# Patient Record
Sex: Male | Born: 1940
Health system: Southern US, Community
[De-identification: ages and names within clinical notes are randomized; demographics above are authoritative.]

## PROBLEM LIST (undated history)

## (undated) DIAGNOSIS — E119 Type 2 diabetes mellitus without complications: Secondary | ICD-10-CM

## (undated) DIAGNOSIS — I429 Cardiomyopathy, unspecified: Secondary | ICD-10-CM

## (undated) DIAGNOSIS — L89309 Pressure ulcer of unspecified buttock, unspecified stage: Secondary | ICD-10-CM

## (undated) DIAGNOSIS — I509 Heart failure, unspecified: Secondary | ICD-10-CM

## (undated) DIAGNOSIS — D631 Anemia in chronic kidney disease: Secondary | ICD-10-CM

## (undated) DIAGNOSIS — IMO0001 Reserved for inherently not codable concepts without codable children: Secondary | ICD-10-CM

## (undated) DIAGNOSIS — Z960 Presence of urogenital implants: Secondary | ICD-10-CM

## (undated) DIAGNOSIS — Z789 Other specified health status: Secondary | ICD-10-CM

## (undated) DIAGNOSIS — G35 Multiple sclerosis: Secondary | ICD-10-CM

## (undated) DIAGNOSIS — IMO0002 Reserved for concepts with insufficient information to code with codable children: Secondary | ICD-10-CM

## (undated) DIAGNOSIS — M86151 Other acute osteomyelitis, right femur: Secondary | ICD-10-CM

## (undated) DIAGNOSIS — N179 Acute kidney failure, unspecified: Secondary | ICD-10-CM

## (undated) DIAGNOSIS — I10 Essential (primary) hypertension: Secondary | ICD-10-CM

## (undated) DIAGNOSIS — M5430 Sciatica, unspecified side: Secondary | ICD-10-CM

## (undated) DIAGNOSIS — Z9889 Other specified postprocedural states: Secondary | ICD-10-CM

## (undated) DIAGNOSIS — Z5189 Encounter for other specified aftercare: Secondary | ICD-10-CM

## (undated) DIAGNOSIS — Z87442 Personal history of urinary calculi: Secondary | ICD-10-CM

## (undated) DIAGNOSIS — N189 Chronic kidney disease, unspecified: Secondary | ICD-10-CM

## (undated) DIAGNOSIS — Z87440 Personal history of urinary (tract) infections: Secondary | ICD-10-CM

## (undated) DIAGNOSIS — N186 End stage renal disease: Secondary | ICD-10-CM

## (undated) DIAGNOSIS — N319 Neuromuscular dysfunction of bladder, unspecified: Secondary | ICD-10-CM

## (undated) DIAGNOSIS — R208 Other disturbances of skin sensation: Secondary | ICD-10-CM

## (undated) DIAGNOSIS — L89204 Pressure ulcer of unspecified hip, stage 4: Secondary | ICD-10-CM

## (undated) DIAGNOSIS — Z8614 Personal history of Methicillin resistant Staphylococcus aureus infection: Secondary | ICD-10-CM

## (undated) DIAGNOSIS — R112 Nausea with vomiting, unspecified: Secondary | ICD-10-CM

## (undated) HISTORY — PX: COLONOSCOPY: SHX5424

## (undated) HISTORY — PX: TONSILLECTOMY: SUR1361

## (undated) HISTORY — PX: WOUND DEBRIDEMENT: SHX247

## (undated) HISTORY — PX: OTHER SURGICAL HISTORY: SHX169

---
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
## 1997-09-11 ENCOUNTER — Other Ambulatory Visit: Admission: RE | Admit: 1997-09-11 | Discharge: 1997-09-11 | Payer: Self-pay | Admitting: Internal Medicine

## 1997-10-05 ENCOUNTER — Other Ambulatory Visit: Admission: RE | Admit: 1997-10-05 | Discharge: 1997-10-05 | Payer: Self-pay | Admitting: Internal Medicine

## 2000-07-19 ENCOUNTER — Encounter: Admission: RE | Admit: 2000-07-19 | Discharge: 2000-10-17 | Payer: Self-pay | Admitting: Internal Medicine

## 2001-07-04 ENCOUNTER — Encounter (HOSPITAL_COMMUNITY): Admission: RE | Admit: 2001-07-04 | Discharge: 2001-10-02 | Payer: Self-pay | Admitting: Internal Medicine

## 2002-05-04 HISTORY — PX: FINGER SURGERY: SHX640

## 2002-06-19 ENCOUNTER — Inpatient Hospital Stay (HOSPITAL_COMMUNITY): Admission: EM | Admit: 2002-06-19 | Discharge: 2002-06-23 | Payer: Self-pay | Admitting: Internal Medicine

## 2002-06-23 ENCOUNTER — Encounter: Payer: Self-pay | Admitting: Internal Medicine

## 2003-01-05 ENCOUNTER — Ambulatory Visit (HOSPITAL_BASED_OUTPATIENT_CLINIC_OR_DEPARTMENT_OTHER): Admission: RE | Admit: 2003-01-05 | Discharge: 2003-01-05 | Payer: Self-pay | Admitting: Urology

## 2003-01-11 ENCOUNTER — Ambulatory Visit (HOSPITAL_COMMUNITY): Admission: RE | Admit: 2003-01-11 | Discharge: 2003-01-11 | Payer: Self-pay | Admitting: Urology

## 2003-01-11 ENCOUNTER — Encounter: Payer: Self-pay | Admitting: Urology

## 2003-02-12 ENCOUNTER — Emergency Department (HOSPITAL_COMMUNITY): Admission: EM | Admit: 2003-02-12 | Discharge: 2003-02-12 | Payer: Self-pay | Admitting: Emergency Medicine

## 2003-02-12 ENCOUNTER — Encounter: Payer: Self-pay | Admitting: Emergency Medicine

## 2003-02-23 ENCOUNTER — Encounter: Payer: Self-pay | Admitting: Emergency Medicine

## 2003-02-23 ENCOUNTER — Inpatient Hospital Stay (HOSPITAL_COMMUNITY): Admission: EM | Admit: 2003-02-23 | Discharge: 2003-02-28 | Payer: Self-pay | Admitting: Emergency Medicine

## 2003-03-09 ENCOUNTER — Inpatient Hospital Stay (HOSPITAL_COMMUNITY): Admission: EM | Admit: 2003-03-09 | Discharge: 2003-05-21 | Payer: Self-pay | Admitting: Emergency Medicine

## 2003-05-21 ENCOUNTER — Inpatient Hospital Stay
Admission: RE | Admit: 2003-05-21 | Discharge: 2003-06-05 | Payer: Self-pay | Admitting: Physical Medicine & Rehabilitation

## 2003-06-05 ENCOUNTER — Inpatient Hospital Stay (HOSPITAL_COMMUNITY)
Admission: RE | Admit: 2003-06-05 | Discharge: 2003-06-26 | Payer: Self-pay | Admitting: Physical Medicine & Rehabilitation

## 2003-07-20 ENCOUNTER — Encounter
Admission: RE | Admit: 2003-07-20 | Discharge: 2003-10-18 | Payer: Self-pay | Admitting: Physical Medicine & Rehabilitation

## 2003-10-19 ENCOUNTER — Encounter
Admission: RE | Admit: 2003-10-19 | Discharge: 2004-01-17 | Payer: Self-pay | Admitting: Physical Medicine & Rehabilitation

## 2004-01-18 ENCOUNTER — Encounter
Admission: RE | Admit: 2004-01-18 | Discharge: 2004-02-25 | Payer: Self-pay | Admitting: Physical Medicine & Rehabilitation

## 2004-02-08 ENCOUNTER — Inpatient Hospital Stay (HOSPITAL_COMMUNITY): Admission: EM | Admit: 2004-02-08 | Discharge: 2004-02-18 | Payer: Self-pay | Admitting: Emergency Medicine

## 2004-02-21 ENCOUNTER — Ambulatory Visit (HOSPITAL_COMMUNITY): Admission: RE | Admit: 2004-02-21 | Discharge: 2004-02-21 | Payer: Self-pay | Admitting: Urology

## 2004-03-05 ENCOUNTER — Encounter
Admission: RE | Admit: 2004-03-05 | Discharge: 2004-06-03 | Payer: Self-pay | Admitting: Physical Medicine & Rehabilitation

## 2004-04-16 ENCOUNTER — Ambulatory Visit (HOSPITAL_COMMUNITY): Admission: RE | Admit: 2004-04-16 | Discharge: 2004-04-16 | Payer: Self-pay | Admitting: Urology

## 2004-06-04 ENCOUNTER — Encounter
Admission: RE | Admit: 2004-06-04 | Discharge: 2004-08-28 | Payer: Self-pay | Admitting: Physical Medicine & Rehabilitation

## 2004-10-19 ENCOUNTER — Emergency Department (HOSPITAL_COMMUNITY): Admission: EM | Admit: 2004-10-19 | Discharge: 2004-10-20 | Payer: Self-pay | Admitting: Emergency Medicine

## 2011-03-13 ENCOUNTER — Encounter (HOSPITAL_COMMUNITY): Payer: Self-pay

## 2011-03-13 ENCOUNTER — Other Ambulatory Visit: Payer: Self-pay | Admitting: Urology

## 2011-03-19 ENCOUNTER — Encounter (HOSPITAL_COMMUNITY): Payer: Self-pay | Admitting: *Deleted

## 2011-03-20 ENCOUNTER — Other Ambulatory Visit (HOSPITAL_COMMUNITY): Payer: Self-pay | Admitting: *Deleted

## 2011-03-23 ENCOUNTER — Encounter (HOSPITAL_COMMUNITY)
Admission: AD | Disposition: A | Discharge status: Home / Self Care | Payer: Self-pay | Source: Ambulatory Visit | Attending: Urology

## 2011-03-23 ENCOUNTER — Observation Stay (HOSPITAL_COMMUNITY)
Admission: AD | Admit: 2011-03-23 | Discharge: 2011-03-24 | Disposition: A | Discharge status: Home / Self Care | Payer: Medicare Other | Source: Ambulatory Visit | Attending: Urology | Admitting: Urology

## 2011-03-23 ENCOUNTER — Ambulatory Visit (HOSPITAL_COMMUNITY): Payer: Medicare Other

## 2011-03-23 DIAGNOSIS — E119 Type 2 diabetes mellitus without complications: Secondary | ICD-10-CM | POA: Insufficient documentation

## 2011-03-23 DIAGNOSIS — G35 Multiple sclerosis: Secondary | ICD-10-CM | POA: Insufficient documentation

## 2011-03-23 DIAGNOSIS — Z7982 Long term (current) use of aspirin: Secondary | ICD-10-CM | POA: Insufficient documentation

## 2011-03-23 DIAGNOSIS — R110 Nausea: Secondary | ICD-10-CM | POA: Insufficient documentation

## 2011-03-23 DIAGNOSIS — R52 Pain, unspecified: Secondary | ICD-10-CM | POA: Insufficient documentation

## 2011-03-23 DIAGNOSIS — Z79899 Other long term (current) drug therapy: Secondary | ICD-10-CM | POA: Insufficient documentation

## 2011-03-23 DIAGNOSIS — N200 Calculus of kidney: Principal | ICD-10-CM | POA: Insufficient documentation

## 2011-03-23 DIAGNOSIS — I10 Essential (primary) hypertension: Secondary | ICD-10-CM | POA: Insufficient documentation

## 2011-03-23 HISTORY — DX: Nausea with vomiting, unspecified: R11.2

## 2011-03-23 HISTORY — DX: Essential (primary) hypertension: I10

## 2011-03-23 HISTORY — DX: Reserved for inherently not codable concepts without codable children: IMO0001

## 2011-03-23 HISTORY — DX: Other specified postprocedural states: Z98.890

## 2011-03-23 HISTORY — DX: Encounter for other specified aftercare: Z51.89

## 2011-03-23 HISTORY — PX: EXTRACORPOREAL SHOCK WAVE LITHOTRIPSY: SHX1557

## 2011-03-23 HISTORY — DX: Sciatica, unspecified side: M54.30

## 2011-03-23 SURGERY — LITHOTRIPSY, ESWL
Anesthesia: LOCAL | Laterality: Right

## 2011-03-23 MED ORDER — HYDROMORPHONE HCL PF 2 MG/ML IJ SOLN: 1.0000 mg | INTRAMUSCULAR | Status: DC | PRN

## 2011-03-23 MED ORDER — HYDROMORPHONE HCL PF 2 MG/ML IJ SOLN
INTRAMUSCULAR | Status: AC
  Administered 2011-03-23: 1 mg via INTRAVENOUS
  Filled 2011-03-23: qty 1

## 2011-03-23 MED ORDER — CIPROFLOXACIN HCL 500 MG PO TABS
500.0000 mg | ORAL_TABLET | Freq: Once | ORAL | Status: DC
  Administered 2011-03-23: 500 mg via ORAL

## 2011-03-23 MED ORDER — DIAZEPAM 5 MG PO TABS
10.0000 mg | ORAL_TABLET | ORAL | Status: DC
  Administered 2011-03-23: 10 mg via ORAL

## 2011-03-23 MED ORDER — DIPHENHYDRAMINE HCL 25 MG PO CAPS
25.0000 mg | ORAL_CAPSULE | ORAL | Status: AC
  Administered 2011-03-23: 25 mg via ORAL

## 2011-03-23 MED ORDER — ASPIRIN 81 MG PO TABS: 81.0000 mg | ORAL_TABLET | Freq: Every day | ORAL | Status: DC

## 2011-03-23 MED ORDER — OMEGA-3 FATTY ACIDS 1000 MG PO CAPS: 1.0000 g | ORAL_CAPSULE | Freq: Every day | ORAL | Status: DC

## 2011-03-23 MED ORDER — TAMSULOSIN HCL 0.4 MG PO CAPS: 0.4000 mg | ORAL_CAPSULE | Freq: Every day | ORAL | Status: DC

## 2011-03-23 MED ORDER — HYDROCODONE-ACETAMINOPHEN 5-325 MG PO TABS: 1.0000 | ORAL_TABLET | ORAL | Status: AC | PRN

## 2011-03-23 MED ORDER — SODIUM CHLORIDE 0.45 % IV SOLN: INTRAVENOUS | Status: DC

## 2011-03-23 MED ORDER — CIPROFLOXACIN HCL 500 MG PO TABS
ORAL_TABLET | ORAL | Status: AC
  Administered 2011-03-23: 500 mg via ORAL
  Filled 2011-03-23: qty 1

## 2011-03-23 MED ORDER — ONDANSETRON HCL 4 MG PO TABS: 4.0000 mg | ORAL_TABLET | Freq: Four times a day (QID) | ORAL | Status: DC | PRN

## 2011-03-23 MED ORDER — ONDANSETRON HCL 4 MG/2ML IJ SOLN: 4.0000 mg | Freq: Four times a day (QID) | INTRAMUSCULAR | Status: DC | PRN

## 2011-03-23 MED ORDER — CIPROFLOXACIN HCL 500 MG PO TABS: 500.0000 mg | ORAL_TABLET | Freq: Two times a day (BID) | ORAL | Status: AC

## 2011-03-23 MED ORDER — DIAZEPAM 5 MG PO TABS
ORAL_TABLET | ORAL | Status: AC
  Filled 2011-03-23: qty 2

## 2011-03-23 MED ORDER — PROMETHAZINE HCL 25 MG/ML IJ SOLN: 25.0000 mg | Freq: Four times a day (QID) | INTRAMUSCULAR | Status: DC | PRN

## 2011-03-23 MED ORDER — PROMETHAZINE HCL 25 MG/ML IJ SOLN
INTRAMUSCULAR | Status: AC
  Administered 2011-03-23: 25 mg via INTRAVENOUS
  Filled 2011-03-23: qty 1

## 2011-03-23 MED ORDER — DIPHENHYDRAMINE HCL 25 MG PO CAPS
ORAL_CAPSULE | ORAL | Status: AC
  Filled 2011-03-23: qty 1

## 2011-03-23 MED ORDER — HYDROMORPHONE HCL PF 1 MG/ML IJ SOLN: 1.0000 mg | INTRAMUSCULAR | Status: DC | PRN

## 2011-03-23 NOTE — Progress Notes (Signed)
Chief Complaint Right Nephrolithiasis  History of Present Illness         70 year old gentleman has a right upper pole 14 mm renal calculus. It is 600 Hounsfield units or less. I explained that this could be associated with his urinary infections, or it could be his urinary stasis that causes infections. I would guess since he does not have fevers or signs of pyelonephritis and that his stone is not likely the source of his infections.  Today we discussed the management of urinary stones. We discussed the risks, benefits, alternatives and likelihood of achieving his goals. These options include observation, ureteroscopy, shockwave lithotripsy, and PCNL. We discussed which options are relevant to these particular stones. We discussed the natural history of stones as well as the complications of untreated stones and the impact on quality of life without treatment as well as with each of the above listed treatments. We also discussed the efficacy of each treatment in its ability to clear the stone burden. With any of these management options I discussed the signs and symptoms of infection and the need for emergent treatment should these be experienced. For each option we discussed the ability of each procedure to clear the patient of their stone burden.  For observation I described the risks which include but are not limited to silent renal damage, life-threatening infection, need for emergent surgery, failure to pass stone, and pain.  For ureteroscopy I described the risks which include heart attack, stroke, pulmonary embolus, death, bleeding, infection, damage to contiguous structures, positioning injury, ureteral stricture, ureteral avulsion, ureteral injury, need for ureteral stent, inability to perform ureteroscopy, need for an interval procedure, inability to clear stone burden, stent discomfort and pain.  For shockwave lithotripsy I described the risks which include arrhythmia, kidney contusion,  kidney hemorrhage, need for transfusion, long-term risk of diabetes or hypertension, back discomfort, flank ecchymosis, flank abrasion, inability to break up stone, inability to pass stone fragments, Steinstrasse, infection associated with obstructing stones, need for different surgical procedure, need for repeat shockwave lithotripsy, and death.  For PCNL I described the risks including heart attack, sure, pulmonary embolus, death, positioning injury, pneumothorax, hydrothorax, need for chest tube, inability to clear stone burden, renal laceration, arterial venous fistula or malformation, need for embolization of kidney, loss of kidney or renal function, need for repeat procedure, need for prolonged nephrostomy tube, ureteral avulsion, fistula.  Past Medical History Problems  1. History of  Diabetes Mellitus 250.00 2. History of  Multiple Sclerosis 340 3. History of  Nephrolithiasis V13.01 4. History of  Nephrolithiasis Of The Left Kidney V13.01 5. History of  Pyelonephritis 590.80  Surgical History Problems  1. History of  Decubitus Ulcer Excision  Current Meds 1. Actos 30 MG Oral Tablet; Therapy: (Recorded:27Apr2009) to 2. Aspirin 81 MG Oral Tablet; Therapy: (Recorded:27Apr2009) to 3. Catheter Red Rubber Coude Tip Miscellaneous; CIC Q8hrs; Therapy: 07Sep2012 to (Last  Rx:07Sep2012) 4. Ciprofloxacin HCl 500 MG Oral Tablet; Take 1 tablet twice daily; Therapy: [id number] to  (Evaluate:09Nov2012)  Requested for: [id number]; Last Rx:02Nov2012 5. Fish Oil CAPS; Therapy: (Recorded:12Jan2010) to 6. Glimepiride 4 MG Oral Tablet; Therapy: (Recorded:27Apr2009) to 7. Lisinopril-Hydrochlorothiazide 20-25 MG Oral Tablet; Therapy: (Recorded:27Apr2009) to 8. Methenamine Hippurate 1 GM Oral Tablet; 1/2 tab bid; Therapy: [phone] to  (Evaluate:26Aug2013)  Requested for: 31Aug2012; Last Rx:31Aug2012 9. Oxybutynin Chloride 5 MG Oral Tablet; TAKE 1 TABLET 2-3 TIMES DAILY; Therapy: 20Jul2009 to   (Evaluate:16Aug2013); Last Rx:21Aug2012 10. Simvastatin 20 MG Oral Tablet; Therapy: (Recorded:27Apr2009) to  Allergies  Medication  1. No Known Drug Allergies  Family History Problems  1. Family history of  Death In The Family Father Deceasd at age 17; Stroke 2. Family history of  Death In The Family Mother Deceased at age 31; Stroke 3. Paternal history of  Diabetes Mellitus V18.0 4. Paternal grandmother's history of  Diabetes Mellitus V18.0 5. Family history of  Family Health Status Number Of Children 2 sons, 3 daughters 62. Paternal history of  Transient Ischemic Attack 7. Maternal history of  Transient Ischemic Attack  Social History Problems  1. Alcohol Use 1 a day 2. Former Smoker V15.82 3. Marital History - Currently Married 4. Occupation: retired Producer, television/film/video  5. Caffeine Use 6. Tobacco Use V15.82  Review of Systems Constitutional, skin, eye, otolaryngeal, hematologic/lymphatic, cardiovascular, pulmonary, endocrine, musculoskeletal, gastrointestinal, neurological and psychiatric system(s) were reviewed and pertinent findings if present are noted.     Physical Exam Constitutional: Well nourished and well developed.  Neck: The appearance of the neck is normal.  Pulmonary: No respiratory distress and normal respiratory rhythm and effort.  Abdomen: The abdomen is soft and nontender. No masses are palpated. The abdomen is no rebound. No CVA tenderness.  Genitourinary: Examination of the penis demonstrates a penile prosthesis, but no swelling, no tenderness, no discharge and no lesions.  Skin: Normal skin turgor and no visible rash.  Neuro/Psych:. Mood and affect are appropriate.    Procedure  Procedure: Cystoscopy  Indication: Hematuria.  Informed Consent: Risks, benefits, and potential adverse events were discussed and informed consent was obtained from the patient . Specific risks including, but not limited to bleeding, infection, pain, allergic reaction etc. were  explained.  Prep: The patient was prepped with betadine.  Anesthesia:. Local anesthesia was administered intraurethrally with 2% lidocaine jelly.  Antibiotic prophylaxis: Ciprofloxacin.  Procedure Note:  Urethral meatus:. No abnormalities.  Anterior urethra: No abnormalities.  Prostatic urethra: No abnormalities.  Bladder: Visulization was obscured due to cloudy urine. The right ureteral orifice had clear efflux of urine. The left ureteral orifice was not able to be identified. Examination of the bladder demonstrated trabeculation, but no clot within the bladder and no diverticulum cellules, but no erythematous mucosa and no edema. The patient tolerated the procedure well.  Complications: None.    Assessment Assessed  Nephrolithiasis Of The Right Kidney   Plan Proceed with shockwave lithotripsy of his right upper pole renal stone.

## 2011-03-23 NOTE — Op Note (Signed)
See Operative Report scanned in from [facility].

## 2011-03-23 NOTE — Progress Notes (Signed)
pateint doing in and out self cath in sitting position felt nauseous and felt faint. Patient pale responsive vs taken, patient denies pain. Nasal O2 applied at 2L.

## 2011-03-24 ENCOUNTER — Encounter (HOSPITAL_COMMUNITY): Payer: Self-pay | Admitting: *Deleted

## 2011-03-24 DIAGNOSIS — N200 Calculus of kidney: Secondary | ICD-10-CM

## 2011-03-24 LAB — GLUCOSE, CAPILLARY

## 2011-03-24 NOTE — Plan of Care (Signed)
Problem: Phase I Progression Outcomes Goal: Voiding-avoid urinary catheter unless indicated Outcome: Not Applicable Date Met:  [id number] Pt self caths q6h

## 2011-03-24 NOTE — Progress Notes (Addendum)
MD(PA)  on call for Dr. Jasmine December notified that pt had not been seen today (03/24/11).  PA said that pt would be seen tomorrow (03/25/11) by Dr. Jasmine December. Pt self-catheterizes every 6 hours.  Urine output for today has been 150 cc.

## 2011-03-24 NOTE — Discharge Planning (Signed)
Discharge instructions given to pt/spouse, verbalized understanding. Left the unit in stable condition.  

## 2011-03-24 NOTE — Discharge Summary (Signed)
Physician Discharge Summary  Patient ID: Lance [name] MRN: [id number] [name]/AGE: 70-Jul-1942 70 y.o.  Admit date: 03/23/2011 Discharge date: 03/24/2011  Admission Diagnoses:  Discharge Diagnoses:  Active Problems:  * No active hospital problems. *    Discharged Condition: good  Hospital Course:  Patient was admitted after SWL on right kidney for nausea and pain.  He was able to control his pain.  His nausea subsided.  He was able to tolerate a regular diet.  He had no significant fevers.  Consults: none  Significant Diagnostic Studies: None  Treatments: Shockwave lithotripsy on the right kidney.  Discharge Exam: Blood pressure 127/64, pulse 96, temperature 98.6 F (37 C), temperature source Oral, resp. rate 18, height 6\' 4"  (1.93 m), weight 92.987 kg (205 lb), SpO2 94.00%. Gen:  NAD, AAO Chest:  Equal effort bilaterally.  No labored breathing. CV:  Pules regular and equal in BUE Abdomen: Soft, no rebound TTP, no guarding Flank:  Right flank with 2.5 cm abrasion.  No flank ecchymosis. Extremity: No gross deformity BUE.  Disposition:   Discharge Orders    Future Orders Please Complete By Expires   Discharge instructions      Comments:   Patient to follow up 112712 at 8:30 am   Discharge patient      Comments:   According to post-SWL protocol.   Discharge patient        Current Discharge Medication List    START taking these medications   Details  !! ciprofloxacin (CIPRO) 500 MG tablet Take 1 tablet (500 mg total) by mouth 2 (two) times daily. Qty: 6 tablet, Refills: 0    HYDROcodone-acetaminophen (NORCO) 5-325 MG per tablet Take 1-2 tablets by mouth every 4 (four) hours as needed for pain. Qty: 40 tablet, Refills: 0    Tamsulosin HCl (FLOMAX) 0.4 MG CAPS Take 1 capsule (0.4 mg total) by mouth at bedtime. Qty: 30 capsule, Refills: 3     !! - Potential duplicate medications found. Please discuss with provider.    CONTINUE these medications which have  CHANGED   Details  aspirin 81 MG tablet Take 1 tablet (81 mg total) by mouth daily. Qty: 1 tablet, Refills: 0    fish oil-omega-3 fatty acids 1000 MG capsule Take 1 capsule (1 g total) by mouth daily. Qty: 1 capsule, Refills: 0      CONTINUE these medications which have NOT CHANGED   Details  Cholecalciferol (VITAMIN D3) 2000 UNITS TABS Take 2,000 mg by mouth daily.      !! ciprofloxacin (CIPRO) 500 MG tablet Take 500 mg by mouth 2 (two) times daily. Pt started on 03-06-11 for 7 days     glimepiride (AMARYL) 4 MG tablet Take 4 mg by mouth every morning.      lisinopril-hydrochlorothiazide (PRINZIDE,ZESTORETIC) 20-25 MG per tablet Take 1 tablet by mouth every morning.      methenamine (MANDELAMINE) 1 GM tablet Take 0.5 g by mouth daily.      oxybutynin (DITROPAN) 5 MG tablet Take 5 mg by mouth 2 (two) times daily.      pioglitazone (ACTOS) 30 MG tablet Take 30 mg by mouth daily.      simvastatin (ZOCOR) 20 MG tablet Take 20 mg by mouth at bedtime.       !! - Potential duplicate medications found. Please discuss with provider.     Follow-up Information    Follow up with Molli Hazard, MD on 03/31/2011. (8:30 am)    Contact information:  [facility] [place] [phone]          Signed: Molli Hazard 03/24/2011, 6:30 PM

## 2011-03-30 ENCOUNTER — Emergency Department (HOSPITAL_COMMUNITY): Payer: Medicare Other

## 2011-03-30 ENCOUNTER — Encounter: Payer: Self-pay | Admitting: Urology

## 2011-03-30 ENCOUNTER — Inpatient Hospital Stay (HOSPITAL_COMMUNITY)
Admission: EM | Admit: 2011-03-30 | Discharge: 2011-04-06 | DRG: 394 | Disposition: A | Discharge status: Rehabilitation Facility | Payer: Medicare Other | Attending: Internal Medicine | Admitting: Internal Medicine

## 2011-03-30 ENCOUNTER — Encounter (HOSPITAL_COMMUNITY): Payer: Self-pay

## 2011-03-30 DIAGNOSIS — N390 Urinary tract infection, site not specified: Secondary | ICD-10-CM | POA: Diagnosis present

## 2011-03-30 DIAGNOSIS — L89899 Pressure ulcer of other site, unspecified stage: Secondary | ICD-10-CM | POA: Diagnosis present

## 2011-03-30 DIAGNOSIS — L89309 Pressure ulcer of unspecified buttock, unspecified stage: Secondary | ICD-10-CM | POA: Diagnosis present

## 2011-03-30 DIAGNOSIS — L89159 Pressure ulcer of sacral region, unspecified stage: Secondary | ICD-10-CM

## 2011-03-30 DIAGNOSIS — L89509 Pressure ulcer of unspecified ankle, unspecified stage: Secondary | ICD-10-CM | POA: Diagnosis present

## 2011-03-30 DIAGNOSIS — E119 Type 2 diabetes mellitus without complications: Secondary | ICD-10-CM | POA: Diagnosis present

## 2011-03-30 DIAGNOSIS — I129 Hypertensive chronic kidney disease with stage 1 through stage 4 chronic kidney disease, or unspecified chronic kidney disease: Secondary | ICD-10-CM | POA: Diagnosis present

## 2011-03-30 DIAGNOSIS — D649 Anemia, unspecified: Secondary | ICD-10-CM

## 2011-03-30 DIAGNOSIS — L97309 Non-pressure chronic ulcer of unspecified ankle with unspecified severity: Secondary | ICD-10-CM

## 2011-03-30 DIAGNOSIS — E860 Dehydration: Secondary | ICD-10-CM | POA: Diagnosis present

## 2011-03-30 DIAGNOSIS — A4902 Methicillin resistant Staphylococcus aureus infection, unspecified site: Secondary | ICD-10-CM | POA: Diagnosis present

## 2011-03-30 DIAGNOSIS — N179 Acute kidney failure, unspecified: Secondary | ICD-10-CM | POA: Diagnosis present

## 2011-03-30 DIAGNOSIS — G35 Multiple sclerosis: Secondary | ICD-10-CM | POA: Insufficient documentation

## 2011-03-30 DIAGNOSIS — Z79899 Other long term (current) drug therapy: Secondary | ICD-10-CM

## 2011-03-30 DIAGNOSIS — L89109 Pressure ulcer of unspecified part of back, unspecified stage: Secondary | ICD-10-CM | POA: Diagnosis present

## 2011-03-30 DIAGNOSIS — Z87442 Personal history of urinary calculi: Secondary | ICD-10-CM

## 2011-03-30 DIAGNOSIS — D62 Acute posthemorrhagic anemia: Secondary | ICD-10-CM | POA: Diagnosis present

## 2011-03-30 DIAGNOSIS — L8992 Pressure ulcer of unspecified site, stage 2: Secondary | ICD-10-CM | POA: Diagnosis present

## 2011-03-30 DIAGNOSIS — Y92009 Unspecified place in unspecified non-institutional (private) residence as the place of occurrence of the external cause: Secondary | ICD-10-CM

## 2011-03-30 DIAGNOSIS — N1330 Unspecified hydronephrosis: Secondary | ICD-10-CM | POA: Diagnosis present

## 2011-03-30 DIAGNOSIS — Z7982 Long term (current) use of aspirin: Secondary | ICD-10-CM

## 2011-03-30 DIAGNOSIS — K661 Hemoperitoneum: Principal | ICD-10-CM | POA: Diagnosis present

## 2011-03-30 DIAGNOSIS — M5430 Sciatica, unspecified side: Secondary | ICD-10-CM | POA: Diagnosis present

## 2011-03-30 DIAGNOSIS — R238 Other skin changes: Secondary | ICD-10-CM

## 2011-03-30 DIAGNOSIS — W19XXXA Unspecified fall, initial encounter: Secondary | ICD-10-CM | POA: Diagnosis present

## 2011-03-30 DIAGNOSIS — K5900 Constipation, unspecified: Secondary | ICD-10-CM | POA: Diagnosis not present

## 2011-03-30 DIAGNOSIS — R5381 Other malaise: Secondary | ICD-10-CM | POA: Diagnosis present

## 2011-03-30 DIAGNOSIS — Y999 Unspecified external cause status: Secondary | ICD-10-CM

## 2011-03-30 DIAGNOSIS — N189 Chronic kidney disease, unspecified: Secondary | ICD-10-CM | POA: Diagnosis present

## 2011-03-30 DIAGNOSIS — IMO0002 Reserved for concepts with insufficient information to code with codable children: Secondary | ICD-10-CM | POA: Diagnosis present

## 2011-03-30 LAB — CBC
HCT: 19.6 % — ABNORMAL LOW (ref 39.0–52.0)
Hemoglobin: 6.7 g/dL — CL (ref 13.0–17.0)
WBC: 11.6 10*3/uL — ABNORMAL HIGH (ref 4.0–10.5)

## 2011-03-30 LAB — DIFFERENTIAL
Basophils Absolute: 0 10*3/uL (ref 0.0–0.1)
Lymphocytes Relative: 12 % (ref 12–46)
Monocytes Absolute: 0.9 10*3/uL (ref 0.1–1.0)
Monocytes Relative: 8 % (ref 3–12)
Neutro Abs: 9 10*3/uL — ABNORMAL HIGH (ref 1.7–7.7)

## 2011-03-30 LAB — URINALYSIS, ROUTINE W REFLEX MICROSCOPIC
Glucose, UA: NEGATIVE mg/dL
Ketones, ur: NEGATIVE mg/dL
Specific Gravity, Urine: 1.013 (ref 1.005–1.030)
pH: 5.5 (ref 5.0–8.0)

## 2011-03-30 LAB — URINE MICROSCOPIC-ADD ON

## 2011-03-30 LAB — ABO/RH: ABO/RH(D): A POS

## 2011-03-30 LAB — BASIC METABOLIC PANEL
BUN: 102 mg/dL — ABNORMAL HIGH (ref 6–23)
CO2: 21 mEq/L (ref 19–32)
Chloride: 104 mEq/L (ref 96–112)
Creatinine, Ser: 4.53 mg/dL — ABNORMAL HIGH (ref 0.50–1.35)
Potassium: 4.5 mEq/L (ref 3.5–5.1)

## 2011-03-30 LAB — GLUCOSE, CAPILLARY

## 2011-03-30 LAB — PREPARE RBC (CROSSMATCH)

## 2011-03-30 MED ORDER — SODIUM CHLORIDE 0.9 % IV SOLN
INTRAVENOUS | Status: DC
  Administered 2011-03-30: 23:00:00 via INTRAVENOUS

## 2011-03-30 MED ORDER — VANCOMYCIN HCL IN DEXTROSE 1-5 GM/200ML-% IV SOLN: 1000.0000 mg | INTRAVENOUS | Status: DC

## 2011-03-30 MED ORDER — PIPERACILLIN-TAZOBACTAM IN DEX 2-0.25 GM/50ML IV SOLN
2.2500 g | Freq: Three times a day (TID) | INTRAVENOUS | Status: DC
  Administered 2011-03-31: 2.25 g via INTRAVENOUS
  Filled 2011-03-30 (×4): qty 50

## 2011-03-30 MED ORDER — VANCOMYCIN HCL IN DEXTROSE 1-5 GM/200ML-% IV SOLN
1000.0000 mg | INTRAVENOUS | Status: AC
  Administered 2011-03-30: 1000 mg via INTRAVENOUS
  Filled 2011-03-30: qty 200

## 2011-03-30 MED ORDER — SODIUM CHLORIDE 0.9 % IV BOLUS (SEPSIS)
1000.0000 mL | Freq: Once | INTRAVENOUS | Status: AC
  Administered 2011-03-30: 1000 mL via INTRAVENOUS

## 2011-03-30 MED ORDER — CIPROFLOXACIN IN D5W 400 MG/200ML IV SOLN: 400.0000 mg | Freq: Two times a day (BID) | INTRAVENOUS | Status: DC

## 2011-03-30 MED ORDER — PIPERACILLIN-TAZOBACTAM 3.375 G IVPB 30 MIN
3.3750 g | Freq: Three times a day (TID) | INTRAVENOUS | Status: DC
  Administered 2011-03-30: 3.375 g via INTRAVENOUS
  Filled 2011-03-30 (×3): qty 50

## 2011-03-30 NOTE — ED Notes (Signed)
Ultrasound tech at bedside with pt at this time.

## 2011-03-30 NOTE — ED Notes (Signed)
Per ems----pt has large blisters on his left foot.  Pt is not very mobile.  PCP told him to call EMS.  He is a diabetic and has MS.

## 2011-03-30 NOTE — ED Notes (Signed)
Patient transported to X-ray 

## 2011-03-30 NOTE — H&P (Signed)
Lance [name] MRN: [id number] [name]/AGE: January 24, 1941 70 y.o. Primary Care Physician:No primary provider on file. Admit date: 03/30/2011  PCP Dr. Lavone Orn  Chief Complaint:weakness  HPI: This is a 70 year old male who presents to the ER with multiple complaints. He had an ESWL done on the 21st. Following the procedure he spent the night in the hospital without food, without any admission orders. He subsequently went discharged, after being on monitor and in the hospital for 24 hours and started experiencing weakness and lightheadedness. At baseline has a power chair and is able to do transfers between his powerchair in his bed up however he was unable to do so after his discharge. After his ESWL he has been straining his urine, and states that he has been passing kidney stones. He spiked a fever of 100.2 in the ER, but denies any fever chills or riders and home. He is also found to be anemic with a hemoglobin off 6.7. Which is new for him. Past Medical History  Diagnosis Date  . Hypertension   . Blood transfusion   . Neuralgia neuritis, sciatic nerve     MS for 30 years  . Diabetes mellitus   . PONV (postoperative nausea and vomiting)     Past Surgical History  Procedure Date  . Tonsillectomy   . Finger surgery 2004    Prior to Admission medications   Medication Sig Start Date End Date Taking? Authorizing Provider  aspirin 81 MG tablet Take 1 tablet (81 mg total) by mouth daily. 03/30/11  Yes Molli Hazard, MD  ciprofloxacin (CIPRO) 500 MG tablet Take 500 mg by mouth 2 (two) times daily. Pt started on 03-06-11 for 7 days    Yes Historical Provider, MD  ciprofloxacin (CIPRO) 500 MG tablet Take 1 tablet (500 mg total) by mouth 2 (two) times daily. 03/23/11 04/02/11 Yes Molli Hazard, MD  fish oil-omega-3 fatty acids 1000 MG capsule Take 1 capsule (1 g total) by mouth daily. 03/30/11  Yes Molli Hazard, MD  glimepiride (AMARYL) 4 MG tablet Take 4 mg by mouth  every morning.     Yes Historical Provider, MD  HYDROcodone-acetaminophen (NORCO) 5-325 MG per tablet Take 1-2 tablets by mouth every 4 (four) hours as needed for pain. 03/23/11 04/02/11 Yes Molli Hazard, MD  lisinopril-hydrochlorothiazide (PRINZIDE,ZESTORETIC) 20-25 MG per tablet Take 1 tablet by mouth every morning.     Yes Historical Provider, MD  methenamine (MANDELAMINE) 1 GM tablet Take 0.5 g by mouth daily.     Yes Historical Provider, MD  oxybutynin (DITROPAN) 5 MG tablet Take 5 mg by mouth 2 (two) times daily.     Yes Historical Provider, MD  pioglitazone (ACTOS) 30 MG tablet Take 30 mg by mouth daily.     Yes Historical Provider, MD  simvastatin (ZOCOR) 20 MG tablet Take 20 mg by mouth at bedtime.     Yes Historical Provider, MD  Tamsulosin HCl (FLOMAX) 0.4 MG CAPS Take 1 capsule (0.4 mg total) by mouth at bedtime. 03/23/11  Yes Molli Hazard, MD  Cholecalciferol (VITAMIN D3) 2000 UNITS TABS Take 2,000 mg by mouth daily.      Historical Provider, MD    Allergies: No Known Allergies  History reviewed. No pertinent family history.    FAMILY HISTORY:  Father diabetes mellitus, stroke age 40.  Mother valvular  heart disease and stroke late in life.  Sister with hepatitis C.  Son  metachromatic leukodystrophy.   SOCIAL HISTORY:  Married.  Five children, one son age 77 living at home with  metachromatic leukodystrophy.  Smoking, no.  Alcohol, social.  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       ROS: Complete review of systems was done as documented in history of present illness PHYSICAL EXAM: Blood pressure 129/51, pulse 103, temperature 101.2 F (38.4 C), temperature source Rectal, resp. rate 18, SpO2 95.00%.  Constitutional: He is oriented to person, place, and time. He appears well-developed and well-nourished.  HENT:  Head: Normocephalic and atraumatic.  Neck: Neck supple.    Cardiovascular: Normal rate, regular rhythm and normal heart sounds.  Pulmonary/Chest: Breath sounds normal. No respiratory distress. He has no wheezes. He has no rales. He exhibits no tenderness.  Abdominal: Soft. Bowel sounds are normal. He exhibits no distension and no mass. There is no tenderness. There is no rebound and no guarding.  Neurological: He is alert and oriented to person, place, and time.  Skin: Stage II decubitus ulcer on the sacrum  Ulceration over sacrum with surrounding erythema. Skin is open, no discharge. Right posterior ankle with large tense bullous lesion. Distal pulses intact.    Recent Results (from the past 240 hour(s))  MRSA PCR SCREENING     Status: Abnormal   Collection Time   03/24/11  6:03 AM      Component Value Range Status Comment   MRSA by PCR POSITIVE (*) NEGATIVE  Final      Results for orders placed during the hospital encounter of 03/30/11 (from the past 48 hour(s))  GLUCOSE, CAPILLARY     Status: Abnormal   Collection Time   03/30/11  2:25 PM      Component Value Range Comment   Glucose-Capillary 175 (*) 70 - 99 (mg/dL)    Comment 1 Documented in Chart      Comment 2 Notify RN     URINALYSIS, ROUTINE W REFLEX MICROSCOPIC     Status: Abnormal   Collection Time   03/30/11  2:27 PM      Component Value Range Comment   Color, Urine YELLOW  YELLOW     Appearance TURBID (*) CLEAR     Specific Gravity, Urine 1.013  1.005 - 1.030     pH 5.5  5.0 - 8.0     Glucose, UA NEGATIVE  NEGATIVE (mg/dL)    Hgb urine dipstick LARGE (*) NEGATIVE     Bilirubin Urine NEGATIVE  NEGATIVE     Ketones, ur NEGATIVE  NEGATIVE (mg/dL)    Protein, ur 30 (*) NEGATIVE (mg/dL)    Urobilinogen, UA 0.2  0.0 - 1.0 (mg/dL)    Nitrite POSITIVE (*) NEGATIVE     Leukocytes, UA LARGE (*) NEGATIVE    URINE MICROSCOPIC-ADD ON     Status: Normal   Collection Time   03/30/11  2:27 PM      Component Value Range Comment   Squamous Epithelial / LPF RARE  RARE     WBC, UA TOO  NUMEROUS TO COUNT  <3 (WBC/hpf)    RBC / HPF 0-2  <3 (RBC/hpf)    Bacteria, UA RARE  RARE    CBC     Status: Abnormal   Collection Time   03/30/11  3:08 PM      Component Value Range Comment   WBC 11.6 (*) 4.0 - 10.5 (K/uL)    RBC 2.11 (*) 4.22 - 5.81 (MIL/uL)    Hemoglobin 6.7 (*) 13.0 -  17.0 (g/dL)    HCT 19.6 (*) 39.0 - 52.0 (%)    MCV 92.9  78.0 - 100.0 (fL)    MCH 31.8  26.0 - 34.0 (pg)    MCHC 34.2  30.0 - 36.0 (g/dL)    RDW 14.8  11.5 - 15.5 (%)    Platelets 240  150 - 400 (K/uL)   DIFFERENTIAL     Status: Abnormal   Collection Time   03/30/11  3:08 PM      Component Value Range Comment   Neutrophils Relative 78 (*) 43 - 77 (%)    Neutro Abs 9.0 (*) 1.7 - 7.7 (K/uL)    Lymphocytes Relative 12  12 - 46 (%)    Lymphs Abs 1.4  0.7 - 4.0 (K/uL)    Monocytes Relative 8  3 - 12 (%)    Monocytes Absolute 0.9  0.1 - 1.0 (K/uL)    Eosinophils Relative 2  0 - 5 (%)    Eosinophils Absolute 0.3  0.0 - 0.7 (K/uL)    Basophils Relative 0  0 - 1 (%)    Basophils Absolute 0.0  0.0 - 0.1 (K/uL)   BASIC METABOLIC PANEL     Status: Abnormal   Collection Time   03/30/11  3:08 PM      Component Value Range Comment   Sodium 136  135 - 145 (mEq/L)    Potassium 4.5  3.5 - 5.1 (mEq/L)    Chloride 104  96 - 112 (mEq/L)    CO2 21  19 - 32 (mEq/L)    Glucose, Bld 188 (*) 70 - 99 (mg/dL)    BUN 102 (*) 6 - 23 (mg/dL)    Creatinine, Ser 4.53 (*) 0.50 - 1.35 (mg/dL)    Calcium 8.8  8.4 - 10.5 (mg/dL)    GFR calc non Af Amer 12 (*) >90 (mL/min)    GFR calc Af Amer 14 (*) >90 (mL/min)   OCCULT BLOOD, POC DEVICE     Status: Normal   Collection Time   03/30/11  4:04 PM      Component Value Range Comment   Fecal Occult Bld NEGATIVE     PREPARE RBC (CROSSMATCH)     Status: Normal   Collection Time   03/30/11  5:12 PM      Component Value Range Comment   Order Confirmation ORDER PROCESSED BY BLOOD BANK     TYPE AND SCREEN     Status: Normal (Preliminary result)   Collection Time   03/30/11   5:12 PM      Component Value Range Comment   ABO/RH(D) A POS      Antibody Screen NEG      Sample Expiration 04/02/2011      Unit Number [id number]      Blood Component Type RED CELLS,LR      Unit division 00      Status of Unit ALLOCATED      Transfusion Status OK TO TRANSFUSE      Crossmatch Result Compatible      Unit Number [id number]      Blood Component Type RED CELLS,LR      Unit division 00      Status of Unit ALLOCATED      Transfusion Status OK TO TRANSFUSE      Crossmatch Result Compatible     ABO/RH     Status: Normal   Collection Time   03/30/11  5:12 PM      Component  Value Range Comment   ABO/RH(D) A POS       Dg Sacrum/coccyx  03/30/2011  *RADIOLOGY REPORT*  Clinical Data: [place].  Pelvic pain.  SACRUM AND COCCYX - 2+ VIEW  Comparison: None  Findings: The pubic symphysis and SI joints are intact.  No obvious fracture or destructive bony change.  IMPRESSION: No acute bony findings or destructive bony changes.  Original Report Authenticated By: P. Kalman Jewels, M.D.   Dg Ankle Complete Right  03/30/2011  *RADIOLOGY REPORT*  Clinical Data: Large bullous lesion of the ankle.  The patient fell last week.  RIGHT ANKLE - COMPLETE 3+ VIEW  Comparison: None.  Findings: There is diffuse soft tissue edema.  Prominent focal edema identified in the region of the heel, as per history. Within this region, no evidence for soft tissue gas or radiopaque foreign body.  There is no evidence for acute fracture or dislocation. Suspect prior fibular fracture.  There is a well corticated bone density adjacent to the distal tibia, consistent with old injury.  IMPRESSION:  1.  Suspect old fibular fracture. 2. Soft tissue edema in the region of the heel, without associated soft tissue gas or foreign body. 3.  No evidence for acute fracture.  Original Report Authenticated By: Glenice Bow, M.D.   Dg Abd 1 View  03/23/2011  *RADIOLOGY REPORT*  Clinical Data: Pre lithotripsy.   Nephrolithiasis.  ABDOMEN - 1 VIEW  Comparison: 03/03/2011  Findings: Stable 17 mm right renal calculus. Prominent stool throughout the colon.  Phleboliths project over the pelvis.  IMPRESSION: Right nephrolithiasis is stable.  Original Report Authenticated By: Jamas Lav, M.D.   US Renal  03/30/2011  *RADIOLOGY REPORT*  Clinical Data: Acute renal failure.  RENAL/URINARY TRACT ULTRASOUND COMPLETE  Comparison:  Prior CT dated 03/03/2011 and renal ultrasound dated 11/15/2009.  Findings:  Right Kidney:  The right kidney measures approximately 11.7 cm in length.  There is suggestion of dilatation of the collecting system of mild degree.  This kidney previously contained a renal calculus by recent CT and the patient also had lithotripsy 1 week ago.  It is possible that stone fragments may be causing some relative ureteral obstruction.  Left Kidney:  The left kidney measures approximately 11.7 cm. Kidney demonstrates diffuse cortical atrophy.  No hydronephrosis on the left.  Bladder:  The bladder is mildly distended and unremarkable in appearance.  IMPRESSION: Suggestion of mild hydronephrosis of the right kidney.  Given history of renal calculus and recent lithotripsy, there may be some degree of ureteral obstruction. Unenhanced CT of the abdomen and pelvis may help to localize stone fragments.  Original Report Authenticated By: Azzie Roup, M.D.   Dg Pneumonia Chest 1v  03/30/2011  *RADIOLOGY REPORT*  Clinical Data: Cough; history smoking and diabetes.  CHEST - 1 VIEW  Comparison: Chest radiograph performed 10/20/2004  Findings: The lungs are well-aerated.  Mild vascular crowding is noted.  There is no evidence of focal opacification, pleural effusion or pneumothorax.  The cardiomediastinal silhouette is mildly enlarged.  No acute osseous abnormalities are seen.  IMPRESSION: Mild vascular congestion and mild cardiomegaly noted; lungs remain grossly clear.  Original Report Authenticated By: Santa Lighter, M.D.    Impression:  Principal Problem: 1) weakness likely multifactorial patient has a fever, a UTI with recent is eswl and could have urosepsis, also is anemic, he is also dehydrated with acute on chronic renal insufficiency. Will need a full PT OT evaluation prior to discharge  #2 symptomatic anemia  she secondary to chronic kidney disease, will transfuse with 2 units of packed red blood cells  #3 acute on chronic renal insufficiency we'll hydrate with IV fluids and hold lisinopril HCTZ   #4 Multiple sclerosis this appears to be exacerbated because of the above factors   #5 Diabetes mellitus continue with the scale insulin #6 UTI (lower urinary tract infection) the cause of his recent ES WL we'll start him on broad-spectrum antibiotics namely Zosyn and vancomycin.  7 )Sacral decubitus ulcer  Ulcer of ankle  Get a wound care consult, and antibiotics Dr. Lavone Orn will take over the patient in the morning.          Peaches Vanoverbeke 03/30/2011, 9:33 PM

## 2011-03-30 NOTE — Progress Notes (Signed)
ANTIBIOTIC CONSULT NOTE - INITIAL  Pharmacy Consult for Vancomycin/Zosyn Indication: Infected decubitus ulcer  No Known Allergies  Patient Measurements: Height: 6\' 4"  (193 cm) Weight: 205 lb (92.987 kg) IBW/kg (Calculated) : 86.8  Adjusted Body Weight:   Vital Signs: Temp: 101.2 F (38.4 C) (11/26 2127) Temp src: Rectal (11/26 2127) BP: 129/51 mmHg (11/26 2047) Pulse Rate: 103  (11/26 2047) Intake/Output from previous day:   Intake/Output from this shift:    Labs:  [facility] 03/30/11 1508  WBC 11.6*  HGB 6.7*  PLT 240  LABCREA --  CREATININE 4.53*   Estimated Creatinine Clearance: 18.9 ml/min (by C-G formula based on Cr of 4.53).  32ml/min normalized   Microbiology: Recent Results (from the past 720 hour(s))  MRSA PCR SCREENING     Status: Abnormal   Collection Time   03/24/11  6:03 AM      Component Value Range Status Comment   MRSA by PCR POSITIVE (*) NEGATIVE  Final     Medical History: Past Medical History  Diagnosis Date  . Hypertension   . Blood transfusion   . Neuralgia neuritis, sciatic nerve     MS for 30 years  . Diabetes mellitus   . PONV (postoperative nausea and vomiting)     Medications:   (Not in a hospital admission) Assessment: 70 yo male with MS presents to ER with weakness.  Starting broad spectrum antibiotics for possible infected sacral decubitus ulcers.  Goal of Therapy:  Vancomycin trough level 10-15 mcg/ml  Plan:  1. Zosyn 2.25gm IV q8h 2. Vancomycin 1gm IV q48h 3. Check vancomycin trough at steady state 4. Follow renal function & cultures   Emiliano Dyer 03/30/2011,9:53 PM Pager [phone]

## 2011-03-30 NOTE — ED Notes (Signed)
Pressure areas on buttocks present on arrival to the ED.

## 2011-03-30 NOTE — ED Notes (Signed)
Lance [name] [phone] contact info. She would like to know when pt has a room assignment.

## 2011-03-30 NOTE — ED Provider Notes (Signed)
History     CSN: [place]:[id number] Arrival date & time: 03/30/2011 11:56 AM   First MD Initiated Contact with Patient 03/30/11 1359      Chief Complaint  Patient presents with  . Wound Check    (Consider location/radiation/quality/duration/timing/severity/associated sxs/prior treatment) HPI Comments: Patient with hx of MS and diabetes reports large blister over posterior right ankle, wound over buttocks, increased weakness and inability to transfer.  States that he usually has enough upper body strength to transfer and otherwise uses a scooter to get around.  Has not been able to get an appointment with his PCP and is unable to get home health assistance.    Patient is a 70 y.o. male presenting with wound check. The history is provided by the patient.  Wound Check     Past Medical History  Diagnosis Date  . Hypertension   . Blood transfusion   . Neuralgia neuritis, sciatic nerve     MS for 30 years  . Diabetes mellitus   . PONV (postoperative nausea and vomiting)     Past Surgical History  Procedure Date  . Tonsillectomy   . Finger surgery 2004    History reviewed. No pertinent family history.  History  Substance Use Topics  . Smoking status: Former Smoker    Types: Cigars  . Smokeless tobacco: Never Used  . Alcohol Use: 0.6 oz/week    1 Glasses of wine per week      Review of Systems  All other systems reviewed and are negative.    Allergies  Review of patient's allergies indicates no known allergies.  Home Medications   Current Outpatient Rx  Name Route Sig Dispense Refill  . ASPIRIN 81 MG PO TABS Oral Take 1 tablet (81 mg total) by mouth daily. 1 tablet 0  . CIPROFLOXACIN HCL 500 MG PO TABS Oral Take 500 mg by mouth 2 (two) times daily. Pt started on 03-06-11 for 7 days     . CIPROFLOXACIN HCL 500 MG PO TABS Oral Take 1 tablet (500 mg total) by mouth 2 (two) times daily. 6 tablet 0  . OMEGA-3 FATTY ACIDS 1000 MG PO CAPS Oral Take 1 capsule (1 g total) by  mouth daily. 1 capsule 0  . GLIMEPIRIDE 4 MG PO TABS Oral Take 4 mg by mouth every morning.      Marland Kitchen HYDROCODONE-ACETAMINOPHEN 5-325 MG PO TABS Oral Take 1-2 tablets by mouth every 4 (four) hours as needed for pain. 40 tablet 0  . LISINOPRIL-HYDROCHLOROTHIAZIDE 20-25 MG PO TABS Oral Take 1 tablet by mouth every morning.      Marland Kitchen METHENAMINE MANDELATE 1 G PO TABS Oral Take 0.5 g by mouth daily.      . OXYBUTYNIN CHLORIDE 5 MG PO TABS Oral Take 5 mg by mouth 2 (two) times daily.      Marland Kitchen PIOGLITAZONE HCL 30 MG PO TABS Oral Take 30 mg by mouth daily.      Marland Kitchen SIMVASTATIN 20 MG PO TABS Oral Take 20 mg by mouth at bedtime.      . TAMSULOSIN HCL 0.4 MG PO CAPS Oral Take 1 capsule (0.4 mg total) by mouth at bedtime. 30 capsule 3  . VITAMIN D3 2000 UNITS PO TABS Oral Take 2,000 mg by mouth daily.        BP 116/76  Pulse 97  Temp(Src) 98.8 F (37.1 C) (Oral)  Resp 20  SpO2 95%  Physical Exam  Constitutional: He is oriented to person, place, and time. He  appears well-developed and well-nourished.  HENT:  Head: Normocephalic and atraumatic.  Neck: Neck supple.  Cardiovascular: Normal rate, regular rhythm and normal heart sounds.   Pulmonary/Chest: Breath sounds normal. No respiratory distress. He has no wheezes. He has no rales. He exhibits no tenderness.  Abdominal: Soft. Bowel sounds are normal. He exhibits no distension and no mass. There is no tenderness. There is no rebound and no guarding.  Neurological: He is alert and oriented to person, place, and time.  Skin:       Ulceration over sacrum with surrounding erythema. Skin is open, no discharge.  Right posterior ankle with large tense bullous lesion. Distal pulses intact.      ED Course  Procedures (including critical care time) 2:35 PM Patient seen and examined, discussed with Dr Lauris Poag who has also seen the patient.  Labs Reviewed  CBC - Abnormal; Notable for the following:    WBC 11.6 (*)    RBC 2.11 (*)    Hemoglobin 6.7 (*)    HCT  19.6 (*)    All other components within normal limits  DIFFERENTIAL - Abnormal; Notable for the following:    Neutrophils Relative 78 (*)    Neutro Abs 9.0 (*)    All other components within normal limits  BASIC METABOLIC PANEL - Abnormal; Notable for the following:    Glucose, Bld 188 (*)    Creatinine, Ser 4.53 (*)    GFR calc non Af Amer 12 (*)    GFR calc Af Amer 14 (*)    All other components within normal limits  URINALYSIS, ROUTINE W REFLEX MICROSCOPIC - Abnormal; Notable for the following:    Appearance TURBID (*)    Hgb urine dipstick LARGE (*)    Protein, ur 30 (*)    Nitrite POSITIVE (*)    Leukocytes, UA LARGE (*)    All other components within normal limits  GLUCOSE, CAPILLARY - Abnormal; Notable for the following:    Glucose-Capillary 175 (*)    All other components within normal limits  URINE MICROSCOPIC-ADD ON  URINE CULTURE  POCT OCCULT BLOOD STOOL, DEVICE  PREPARE RBC (CROSSMATCH)   Dg Ankle Complete Right  03/30/2011  *RADIOLOGY REPORT*  Clinical Data: Large bullous lesion of the ankle.  The patient fell last week.  RIGHT ANKLE - COMPLETE 3+ VIEW  Comparison: None.  Findings: There is diffuse soft tissue edema.  Prominent focal edema identified in the region of the heel, as per history. Within this region, no evidence for soft tissue gas or radiopaque foreign body.  There is no evidence for acute fracture or dislocation. Suspect prior fibular fracture.  There is a well corticated bone density adjacent to the distal tibia, consistent with old injury.  IMPRESSION:  1.  Suspect old fibular fracture. 2. Soft tissue edema in the region of the heel, without associated soft tissue gas or foreign body. 3.  No evidence for acute fracture.  Original Report Authenticated By: Glenice Bow, M.D.   2:49 PM I have spoken with Maudie Mercury, case manager, regarding arranging home health for the patient.   4:41 PM I have spoken with the triad hospitalist who will admit the patient.  No  temporary orders desired.  Will request med/surg bed.    1. Multiple sclerosis   2. Anemia   3. Sacral pressure ulcer   4. Skin bulla   5. Diabetes mellitus   6. Urinary tract infection       MDM  Patient with MS  and DM, decreased strength over 1 week resulting in inability to transfer himself, has developed pressure ulcer on sacrum and blister on right heel.  Hgb found to be 6.7, have ordered blood type and cross for transfusion.  Admitted to hospitalist.        Otis Brace, [place] 03/30/11 1904

## 2011-03-30 NOTE — ED Notes (Signed)
Pt has MS and has not been moving around much....sitting in a chair most of the time.  States that he has blisters on his right foot and thinks he may have a pressure area on his buttocks.

## 2011-03-30 NOTE — Discharge Planning (Signed)
[facility] ED CM contacted by Raquel Sarna, PA to assist with home health services.  Spoke with pt and sister in law who confirms pt has used [facility] services previously and presently prefers to use them again for services for [facility] PT/OT. Pt states his preference for PT is the same male previously used Can not recall [facility] PT staff's name. Pending xray and further test results. Confirms PCP is Lavone Orn.  Voiced understanding of process for Home health services.

## 2011-03-30 NOTE — Discharge Planning (Signed)
PA, Raquel Sarna updated on pt choice of [facility] for services. Reviewed with pt again the process for home health services when admission to hospital or d/c to home pending further test results.

## 2011-03-31 ENCOUNTER — Inpatient Hospital Stay (HOSPITAL_COMMUNITY): Payer: Medicare Other

## 2011-03-31 LAB — COMPREHENSIVE METABOLIC PANEL
Albumin: 2.2 g/dL — ABNORMAL LOW (ref 3.5–5.2)
BUN: 91 mg/dL — ABNORMAL HIGH (ref 6–23)
Calcium: 8.7 mg/dL (ref 8.4–10.5)
Creatinine, Ser: 3.42 mg/dL — ABNORMAL HIGH (ref 0.50–1.35)
GFR calc Af Amer: 20 mL/min — ABNORMAL LOW (ref >90)
Glucose, Bld: 123 mg/dL — ABNORMAL HIGH (ref 70–99)
Total Protein: 6.3 g/dL (ref 6.0–8.3)

## 2011-03-31 LAB — GLUCOSE, CAPILLARY
Glucose-Capillary: 141 mg/dL — ABNORMAL HIGH (ref 70–99)
Glucose-Capillary: 165 mg/dL — ABNORMAL HIGH (ref 70–99)

## 2011-03-31 LAB — CBC
HCT: 25.2 % — ABNORMAL LOW (ref 39.0–52.0)
Hemoglobin: 8.6 g/dL — ABNORMAL LOW (ref 13.0–17.0)
RBC: 2.76 MIL/uL — ABNORMAL LOW (ref 4.22–5.81)
RDW: 14.9 % (ref 11.5–15.5)
WBC: 11.3 10*3/uL — ABNORMAL HIGH (ref 4.0–10.5)

## 2011-03-31 LAB — IRON AND TIBC
Iron: 13 ug/dL — ABNORMAL LOW (ref 42–135)
TIBC: 162 ug/dL — ABNORMAL LOW (ref 215–435)
UIBC: 149 ug/dL (ref 125–400)

## 2011-03-31 LAB — HEMOGLOBIN AND HEMATOCRIT, BLOOD
HCT: 24.6 % — ABNORMAL LOW (ref 39.0–52.0)
HCT: 24 % — ABNORMAL LOW (ref 39.0–52.0)

## 2011-03-31 LAB — RETICULOCYTES: Retic Count, Absolute: 22.6 10*3/uL (ref 19.0–186.0)

## 2011-03-31 LAB — FOLATE: Folate: 8.3 ng/mL

## 2011-03-31 MED ORDER — TAMSULOSIN HCL 0.4 MG PO CAPS
0.4000 mg | ORAL_CAPSULE | Freq: Every day | ORAL | Status: DC
  Administered 2011-03-31 – 2011-04-05 (×6): 0.4 mg via ORAL
  Filled 2011-03-31 (×7): qty 1

## 2011-03-31 MED ORDER — OMEGA-3-ACID ETHYL ESTERS 1 G PO CAPS
1.0000 g | ORAL_CAPSULE | Freq: Every day | ORAL | Status: DC
  Filled 2011-03-31: qty 1

## 2011-03-31 MED ORDER — PIPERACILLIN-TAZOBACTAM 3.375 G IVPB
3.3750 g | Freq: Three times a day (TID) | INTRAVENOUS | Status: DC
  Administered 2011-03-31 – 2011-04-01 (×4): 3.375 g via INTRAVENOUS
  Filled 2011-03-31 (×6): qty 50

## 2011-03-31 MED ORDER — OMEGA-3 FATTY ACIDS 1000 MG PO CAPS: 1.0000 g | ORAL_CAPSULE | Freq: Every day | ORAL | Status: DC

## 2011-03-31 MED ORDER — GLIMEPIRIDE 4 MG PO TABS
4.0000 mg | ORAL_TABLET | ORAL | Status: DC
  Filled 2011-03-31 (×2): qty 1

## 2011-03-31 MED ORDER — OXYBUTYNIN CHLORIDE 5 MG PO TABS
5.0000 mg | ORAL_TABLET | Freq: Two times a day (BID) | ORAL | Status: DC
  Administered 2011-03-31: 5 mg via ORAL
  Filled 2011-03-31 (×3): qty 1

## 2011-03-31 MED ORDER — PANTOPRAZOLE SODIUM 40 MG IV SOLR
40.0000 mg | Freq: Two times a day (BID) | INTRAVENOUS | Status: DC
Start: 2011-03-31 — End: 2011-03-31
  Administered 2011-03-31: 40 mg via INTRAVENOUS
  Filled 2011-03-31 (×3): qty 40

## 2011-03-31 MED ORDER — ACETAMINOPHEN 650 MG RE SUPP: 650.0000 mg | Freq: Four times a day (QID) | RECTAL | Status: DC | PRN

## 2011-03-31 MED ORDER — METHENAMINE MANDELATE 0.5 G PO TABS
0.5000 g | ORAL_TABLET | Freq: Every day | ORAL | Status: DC
  Filled 2011-03-31: qty 1

## 2011-03-31 MED ORDER — ACETAMINOPHEN 325 MG PO TABS: 650.0000 mg | ORAL_TABLET | Freq: Four times a day (QID) | ORAL | Status: DC | PRN

## 2011-03-31 MED ORDER — HYDROCODONE-ACETAMINOPHEN 5-325 MG PO TABS: 1.0000 | ORAL_TABLET | ORAL | Status: DC | PRN

## 2011-03-31 MED ORDER — ONDANSETRON HCL 4 MG PO TABS: 4.0000 mg | ORAL_TABLET | Freq: Four times a day (QID) | ORAL | Status: DC | PRN

## 2011-03-31 MED ORDER — SODIUM CHLORIDE 0.9 % IV SOLN
INTRAVENOUS | Status: DC
  Administered 2011-04-02 – 2011-04-03 (×3): via INTRAVENOUS

## 2011-03-31 MED ORDER — INSULIN ASPART 100 UNIT/ML ~~LOC~~ SOLN
0.0000 [IU] | Freq: Every day | SUBCUTANEOUS | Status: DC
  Administered 2011-04-04: 2 [IU] via SUBCUTANEOUS

## 2011-03-31 MED ORDER — VANCOMYCIN HCL 1000 MG IV SOLR
750.0000 mg | INTRAVENOUS | Status: DC
  Administered 2011-03-31 – 2011-04-02 (×3): 750 mg via INTRAVENOUS
  Filled 2011-03-31 (×5): qty 750

## 2011-03-31 MED ORDER — ALBUTEROL SULFATE (5 MG/ML) 0.5% IN NEBU: 2.5000 mg | INHALATION_SOLUTION | RESPIRATORY_TRACT | Status: DC | PRN

## 2011-03-31 MED ORDER — PIOGLITAZONE HCL 30 MG PO TABS
30.0000 mg | ORAL_TABLET | Freq: Every day | ORAL | Status: DC
  Filled 2011-03-31: qty 1

## 2011-03-31 MED ORDER — SIMVASTATIN 20 MG PO TABS
20.0000 mg | ORAL_TABLET | Freq: Every day | ORAL | Status: DC
  Filled 2011-03-31: qty 1

## 2011-03-31 MED ORDER — ASPIRIN 81 MG PO TABS
81.0000 mg | ORAL_TABLET | Freq: Every day | ORAL | Status: DC
  Filled 2011-03-31: qty 1

## 2011-03-31 MED ORDER — CIPROFLOXACIN HCL 500 MG PO TABS
500.0000 mg | ORAL_TABLET | Freq: Two times a day (BID) | ORAL | Status: DC
Start: 2011-03-31 — End: 2011-03-31
  Administered 2011-03-31: 500 mg via ORAL
  Filled 2011-03-31 (×3): qty 1

## 2011-03-31 MED ORDER — ONDANSETRON HCL 4 MG/2ML IJ SOLN: 4.0000 mg | Freq: Four times a day (QID) | INTRAMUSCULAR | Status: DC | PRN

## 2011-03-31 MED ORDER — HYDROCODONE-ACETAMINOPHEN 5-325 MG PO TABS
1.0000 | ORAL_TABLET | Freq: Four times a day (QID) | ORAL | Status: DC | PRN
  Administered 2011-03-31: 2 via ORAL
  Filled 2011-03-31: qty 2

## 2011-03-31 MED ORDER — INSULIN ASPART 100 UNIT/ML ~~LOC~~ SOLN
0.0000 [IU] | Freq: Three times a day (TID) | SUBCUTANEOUS | Status: DC
  Administered 2011-03-31 (×2): 2 [IU] via SUBCUTANEOUS
  Administered 2011-04-01 – 2011-04-02 (×3): 3 [IU] via SUBCUTANEOUS
  Administered 2011-04-02: 2 [IU] via SUBCUTANEOUS
  Administered 2011-04-02 – 2011-04-03 (×3): 3 [IU] via SUBCUTANEOUS
  Administered 2011-04-03: 2 [IU] via SUBCUTANEOUS
  Administered 2011-04-04: 3 [IU] via SUBCUTANEOUS
  Administered 2011-04-04: 8 [IU] via SUBCUTANEOUS
  Administered 2011-04-05: 5 [IU] via SUBCUTANEOUS
  Administered 2011-04-05: 3 [IU] via SUBCUTANEOUS
  Administered 2011-04-05: 2 [IU] via SUBCUTANEOUS
  Administered 2011-04-06: 5 [IU] via SUBCUTANEOUS
  Filled 2011-03-31 (×2): qty 3

## 2011-03-31 NOTE — Progress Notes (Signed)
ANTIBIOTIC CONSULT NOTE - INITIAL  Pharmacy Consult for Vancomycin/Zosyn Indication: Infected decubitus ulcer  No Known Allergies  Patient Measurements: Height: 6\' 4"  (193 cm) Weight: 205 lb (92.987 kg) IBW/kg (Calculated) : 86.8   Vital Signs: Temp: 98.6 F (37 C) (11/27 0840) Temp src: Oral (11/27 0840) BP: 149/63 mmHg (11/27 0840) Pulse Rate: 80  (11/27 0840) Intake/Output from previous day: 11/26 0701 - 11/27 0700 In: 362.5 [Blood:362.5] Out: -  Intake/Output from this shift: Total I/O In: 350 [Blood:350] Out: -   Labs:  Basename 03/31/11 1015 03/30/11 1508  WBC 11.3* 11.6*  HGB 8.6* 6.7*  PLT 280 240  LABCREA -- --  CREATININE 3.42* 4.53*   Estimated Creatinine Clearance: 25 ml/min (by C-G formula based on Cr of 3.42).  40ml/min normalized   Microbiology: Recent Results (from the past 720 hour(s))  MRSA PCR SCREENING     Status: Abnormal   Collection Time   03/24/11  6:03 AM      Component Value Range Status Comment   MRSA by PCR POSITIVE (*) NEGATIVE  Final     Medical History: Past Medical History  Diagnosis Date  . Hypertension   . Blood transfusion   . Neuralgia neuritis, sciatic nerve     MS for 30 years  . Diabetes mellitus   . PONV (postoperative nausea and vomiting)     Medications:  Prescriptions prior to admission  Medication Sig Dispense Refill  . aspirin 81 MG tablet Take 1 tablet (81 mg total) by mouth daily.  1 tablet  0  . ciprofloxacin (CIPRO) 500 MG tablet Take 500 mg by mouth 2 (two) times daily. Pt started on 03-06-11 for 7 days       . ciprofloxacin (CIPRO) 500 MG tablet Take 1 tablet (500 mg total) by mouth 2 (two) times daily.  6 tablet  0  . fish oil-omega-3 fatty acids 1000 MG capsule Take 1 capsule (1 g total) by mouth daily.  1 capsule  0  . glimepiride (AMARYL) 4 MG tablet Take 4 mg by mouth every morning.        Marland Kitchen HYDROcodone-acetaminophen (NORCO) 5-325 MG per tablet Take 1-2 tablets by mouth every 4 (four) hours as  needed for pain.  40 tablet  0  . lisinopril-hydrochlorothiazide (PRINZIDE,ZESTORETIC) 20-25 MG per tablet Take 1 tablet by mouth every morning.        . methenamine (MANDELAMINE) 1 GM tablet Take 0.5 g by mouth daily.        Marland Kitchen oxybutynin (DITROPAN) 5 MG tablet Take 5 mg by mouth 2 (two) times daily.        . pioglitazone (ACTOS) 30 MG tablet Take 30 mg by mouth daily.        . simvastatin (ZOCOR) 20 MG tablet Take 20 mg by mouth at bedtime.        . Tamsulosin HCl (FLOMAX) 0.4 MG CAPS Take 1 capsule (0.4 mg total) by mouth at bedtime.  30 capsule  3  . Cholecalciferol (VITAMIN D3) 2000 UNITS TABS Take 2,000 mg by mouth daily.         Assessment: 70 yo male with MS presents to ER with weakness.  Starting broad spectrum antibiotics for possible infected sacral decubitus ulcers. BUN/SCr improved with hydration.  Goal of Therapy:  Vancomycin trough level 10-15 mcg/ml  Plan:  1. Change Zosyn to 3.375g IV Q8H infused over 4hrs. 2. Change Vancomycin to 750mg  IV q24h 3. Check vancomycin trough at steady state 4. Follow  renal function & cultures  Lolita Patella 03/31/2011,1:26 PM Pager [phone]

## 2011-03-31 NOTE — Progress Notes (Signed)
Subjective: No new complaints  Objective: Vital signs in last 24 hours: Temp:  [98.8 F (37.1 C)-101.2 F (38.4 C)] 98.8 F (37.1 C) (11/27 0517) Pulse Rate:  [83-103] 84  (11/27 0517) Resp:  [16-94] 18  (11/27 0517) BP: (116-147)/(45-76) 147/68 mmHg (11/27 0517) SpO2:  [95 %-97 %] 97 % (11/27 0153) Weight:  [92.987 kg (205 lb)] 205 lb (92.987 kg) (11/26 2143) Weight change:  Last BM Date: 03/30/11  Intake/Output from previous day: 11/26 0701 - 11/27 0700 In: 362.5 [Blood:362.5] Out: -  Intake/Output this shift: Total I/O In: 362.5 [Blood:362.5] Out: -   General appearance: alert and cooperative Resp: clear to auscultation bilaterally Cardio: regular rate and rhythm, S1, S2 normal, no murmur, click, rub or gallop GI: soft, non-tender; bowel sounds normal; no masses,  no organomegaly Extremities: 1+ B edema Skin: large bulla R posterior ankle, small grade 2 ulcer R lateral lower leg.  Grade 2 ulcer R upper buttock.  Some eccymosis upper scrotum Lab Results:  Basename 03/30/11 1508  WBC 11.6*  HGB 6.7*  HCT 19.6*  PLT 240   BMET  Basename 03/30/11 1508  NA 136  K 4.5  CL 104  CO2 21  GLUCOSE 188*  BUN 102*  CREATININE 4.53*  CALCIUM 8.8    Studies/Results:   Medications: I have reviewed the patient's current medications.  Assessment/Plan: Principal Problem:  *Anemia, R/o pelvic bleeding/fracture, fall 11/21 with scrotal eccymosis      Transfuse 2 U PRBC and obtain noncontrast CT abd and pelvis    Active Problems:  Acute Renal Failure (CKD) suspect secondary to hypotension last week in pt on ACEI and diuretic.    Good urine output      Hold ACEI, IVFs and transfusion.  CT to eval kidneys.  Possible mild obstruction on renal US, as       Urology to see.  Diabetes mellitus      Hold po meds, SSI  UTI (lower urinary tract infection)      Vancomycin and Zosyn until culture available  Sacral decubitus ulcer/R ankle and leg ulcer and bulla      Wound care  consult , overlay mattress    LOS: 1 day   Bernerd Terhune JOSEPH 03/31/2011, 6:26 AM

## 2011-03-31 NOTE — Consult Note (Signed)
WOC consult Note Reason for Consult: Pt has multiple pressure ulcers.  Lay on hard surface for prolonged period of time for lithotripsy earlier this week, and also had an incident at home where he was weak and fell on hard floor for unknown amount of time until he could get assistance to get up.  He states he sat in a recliner for 3 days.  Wounds now beginning to emerge are consistent with previous deep tissue injury injuries which do not manifest an appearance for several days after prolonged period of pressure.  Expect that wounds may evolve into full thickness tissue loss; explained wound progression to patient.  Topical care will not be effective until wounds evolve.    RECOMMEND AIR MATTRESS FOR HOME AND HOME HEALTH TO CONTINUE TO ASSESS SITE IF PT D/C HOME.  HE ALSO NEEDS A LIFT TO ASSIST WITH TRANSFERRING OOB SINCE HE HAS BECOME TOO WEAK, PT STATES.  Wound type:  Right heel stage 2: intact clear fluid-filled blister, 12X12cm Right outer leg stage 2:  2X2X.1cm red moist wound bed Right outer ankle stage 2: .8X.8X.1 cm red moist woundbed Left heel  Deep tissue injury: .5X.5 cm dark purple Right buttock Deep tissue injury 14X14 cm, dark red-purple, hard and painful to touch, no open areas Left buttock  Deep tissue injury: 4X4 cm dark purple with skin beginning to blister and peel Right inner buttock Deep tissue injury: 5X5 cm dark purple with skin beginning to blister and peel  Pressure Ulcer POA: Yes Dressing procedure/placement/frequency: Foam dressing to right leg and sacrum/buttocks area to protect and promote healing.  Keep pressure off right heel and right buttock as much as possible.  Prevalon boot to reduce pressure to right heel and air mattress to reduce pressure to buttocks.  Please reconsult if further assistance is needed.  Gae Dry, RN, MSN, [employer]  [phone]

## 2011-03-31 NOTE — Consults (Signed)
Urology Consult  CC: Right hydronephrosis  HPI: 70 year old male who recently had shock-wave lithotripsy of a right renal stone 03/23/11.  He was kept overnight for nausea and discharged home the following day.  Patient experienced lightheadedness and hypotension on 03/27/11.  He stopped his blood pressure medications and this seemed to help.  He was warned at that time by the on call provider for urology regarding possible renal hematoma following SWL and advised to go to ER if his condition worsened.  He went on to experience malaise and weakness and was transported to the ER by ambulance for evaluation on 03/30/11.    In the ER he was found to have anemia.  Hydronephrosis on the right side by renal US along with acute renal failure.  His serum creatinine has responded to fluid resuscitation.  He was admitted to the service of Lavone Orn, the patient's PCP.  He had a blood transfusion and IV fluids.  His hemoglobin responded appropriately to the transfusion.  The patient explains to me that he experienced a fall during a transfer and hit his right hip.  He subsequently noted some ecchymosis of his penile shaft.  He did have gross hematuria following the SWL, but states that is cleared very quickly.  He had no difficulty with self catheterization and was able to pass several stone fragments.  He states he did not have a fever until yesterday up to 100.  PMH: Past Medical History  Diagnosis Date  . Hypertension   . Blood transfusion   . Neuralgia neuritis, sciatic nerve     MS for 30 years  . Diabetes mellitus   . PONV (postoperative nausea and vomiting)     PSH: Past Surgical History  Procedure Date  . Tonsillectomy   . Finger surgery 2004    Allergies: No Known Allergies  Medications: Prescriptions prior to admission  Medication Sig Dispense Refill  . aspirin 81 MG tablet Take 1 tablet (81 mg total) by mouth daily.  1 tablet  0  . ciprofloxacin (CIPRO) 500 MG tablet Take 500 mg  by mouth 2 (two) times daily. Pt started on 03-06-11 for 7 days       . ciprofloxacin (CIPRO) 500 MG tablet Take 1 tablet (500 mg total) by mouth 2 (two) times daily.  6 tablet  0  . fish oil-omega-3 fatty acids 1000 MG capsule Take 1 capsule (1 g total) by mouth daily.  1 capsule  0  . glimepiride (AMARYL) 4 MG tablet Take 4 mg by mouth every morning.        Marland Kitchen HYDROcodone-acetaminophen (NORCO) 5-325 MG per tablet Take 1-2 tablets by mouth every 4 (four) hours as needed for pain.  40 tablet  0  . lisinopril-hydrochlorothiazide (PRINZIDE,ZESTORETIC) 20-25 MG per tablet Take 1 tablet by mouth every morning.        . methenamine (MANDELAMINE) 1 GM tablet Take 0.5 g by mouth daily.        Marland Kitchen oxybutynin (DITROPAN) 5 MG tablet Take 5 mg by mouth 2 (two) times daily.        . pioglitazone (ACTOS) 30 MG tablet Take 30 mg by mouth daily.        . simvastatin (ZOCOR) 20 MG tablet Take 20 mg by mouth at bedtime.        . Tamsulosin HCl (FLOMAX) 0.4 MG CAPS Take 1 capsule (0.4 mg total) by mouth at bedtime.  30 capsule  3  . Cholecalciferol (VITAMIN D3) 2000  UNITS TABS Take 2,000 mg by mouth daily.           Social History: History   Social History  . Marital Status: Married    Spouse Name: N/A    Number of Children: N/A  . Years of Education: N/A   Occupational History  . Not on file.   Social History Main Topics  . Smoking status: Former Smoker    Types: Cigars  . Smokeless tobacco: Never Used  . Alcohol Use: 0.6 oz/week    1 Glasses of wine per week  . Drug Use: No  . Sexually Active: Not Currently    Birth Control/ Protection: None   Other Topics Concern  . Not on file   Social History Narrative  . No narrative on file    Family History: History reviewed. No pertinent family history.  Review of Systems: Positive: Malaise, weakness, lightheadedness. Negative: Chest pain, SOB.  A further 10 point review of systems was negative except what is listed in the HPI.  Physical  Exam: Filed Vitals:   03/31/11 0840  BP: 149/63  Pulse: 80  Temp: 98.6 F (37 C)  Resp: 18   General: No acute distress.  Awake. Head:  Normocephalic.  Atraumatic. ENT:  EOMI.  Mucous membranes moist Neck:  Supple.  No lymphadenopathy. CV:  S1 present. S2 present. Regular rate. Pulmonary: Equal effort bilaterally.  Clear to auscultation bilaterally. Abdomen: Soft.  Non-tender to palpation.  No flank ecchymosis bilaterally. Skin:  Normal turgor.  No visible rash. Extremity: No gross deformity of bilateral upper extremities.  BLE pitting edema.                                                          Neurologic: Alert. Appropriate mood.  Penis:  Crcumcised.  No lesions.  Mild penile shaft ecchymosis without penile shaft edema.  Prosthesis in place. Urethra: Foley catheter in place draining clear yellow urine.  Orthotopic urethral meatus. Scrotum: No lesions.  Positive ecchymosis at the penile-scrotal junction and superior scrotum.  No erythema or scrotal edema. Testicles: Descended bilaterally.  No masses bilaterally. Epididymis: Palpable bilaterally.  Mildly tender to palpation.  Studies:  Recent Labs  [facility] 03/31/11 1015 03/30/11 1508   HGB 8.6* 6.7*   WBC 11.3* 11.6*   PLT 280 240    Recent Labs  Basename 03/31/11 1015 03/30/11 1508   NA 139 136   K 4.2 4.5   CL 107 104   CO2 21 21   BUN 91* 102*   CREATININE 3.42* 4.53*   CALCIUM 8.7 8.8   GFRNONAA 17* 12*   GFRAA 20* 14*     No results found for this basename: PT:2,INR:2,APTT:2 in the last 72 hours   No components found with this basename: ABG:2    Assessment/Plan: Right hydronephrosis:  Could be due to obstructing stone or peri-nephric hematoma.  Will obtain non-contrasted CT of abdomen/pelvis to evaluate further.  Acute renal failure:  Etiology could be pre-renal due to blood loss and dehydration or due to obstruction.  Will evaluate further with CT.  Acute anemia.  Likely due to blood loss anemia.   Serial hemoglobin & hematocrit every 6 hours until stable.  Transfuse as needed.  Bedrest.   Plan discussed with Dr. Laurann Montana.  Will continue to follow along.  Rolan Bucco, MD [phone]   Pager: [phone]

## 2011-03-31 NOTE — ED Provider Notes (Signed)
Medical screening examination/treatment/procedure(s) were performed by non-physician practitioner and as supervising physician I was immediately available for consultation/collaboration.   Shenandoah Vandergriff A. Lauris Poag, MD 03/31/11 [phone]

## 2011-04-01 LAB — GLUCOSE, CAPILLARY
Glucose-Capillary: 102 mg/dL — ABNORMAL HIGH (ref 70–99)
Glucose-Capillary: 196 mg/dL — ABNORMAL HIGH (ref 70–99)

## 2011-04-01 LAB — HEMOGLOBIN AND HEMATOCRIT, BLOOD
HCT: 24.6 % — ABNORMAL LOW (ref 39.0–52.0)
Hemoglobin: 8.2 g/dL — ABNORMAL LOW (ref 13.0–17.0)

## 2011-04-01 LAB — CBC
MCV: 92.2 fL (ref 78.0–100.0)
Platelets: 337 10*3/uL (ref 150–400)
RBC: 2.7 MIL/uL — ABNORMAL LOW (ref 4.22–5.81)
WBC: 10.2 10*3/uL (ref 4.0–10.5)

## 2011-04-01 LAB — BASIC METABOLIC PANEL
CO2: 24 mEq/L (ref 19–32)
Calcium: 8.9 mg/dL (ref 8.4–10.5)
Chloride: 108 mEq/L (ref 96–112)
Sodium: 141 mEq/L (ref 135–145)

## 2011-04-01 LAB — URINE CULTURE
Colony Count: >=100000
Culture  Setup Time: 201211270118
Special Requests: NORMAL

## 2011-04-01 LAB — DIFFERENTIAL
Eosinophils Relative: 5 % (ref 0–5)
Lymphocytes Relative: 13 % (ref 12–46)
Lymphs Abs: 1.4 10*3/uL (ref 0.7–4.0)

## 2011-04-01 NOTE — Progress Notes (Signed)
   Urology Progress Note  Subjective:     No acute urologic events overnight.  Hemoglobin remained stable; serum creatinine improving. No abdominal pain.  Positive flatus and bowel movement.    Objective:  Patient Vitals for the past 24 hrs:  BP Temp Temp src Pulse Resp SpO2  04/01/11 0441 126/67 mmHg 98.9 F (37.2 C) Oral 88  16  97 %  03/31/11 2023 113/52 mmHg 98.2 F (36.8 C) Oral 80  19  94 %  03/31/11 1432 143/71 mmHg 98.7 F (37.1 C) Oral 81  18  -  03/31/11 0840 149/63 mmHg 98.6 F (37 C) Oral 80  18  -  03/31/11 0740 148/68 mmHg 98.6 F (37 C) Oral 81  18  -    Physical Exam: General:  No acute distress, awake Cardiovascular:    BUE pulses regular and strong. Chest:  CTA-B Abdomen:               []  Soft, appropriately TTP  [x]  Soft, NTTP  []  Soft, appropriately TTP, incision(s) clean/dry/intact  Genitourinary: Penile/scrotal ecchymosis unchanged. Foley: In place draining clear yellow urine.    I/O last 3 completed shifts: In: 712.5 [Blood:712.5] Out: 1000 [Urine:1000]  Recent Labs  Basename 04/01/11 0335 03/31/11 2145 03/31/11 1015   HGB 8.3* 8.1* --   WBC 10.2 -- 11.3*   PLT 337 -- 280    Recent Labs  Basename 04/01/11 0335 03/31/11 1015   NA 141 139   K 4.4 4.2   CL 108 107   CO2 24 21   BUN 87* 91*   CREATININE 3.16* 3.42*   CALCIUM 8.9 8.7   GFRNONAA 19* 17*   GFRAA 22* 20*     No results found for this basename: PT:2,INR:2,APTT:2 in the last 72 hours   No components found with this basename: ABG:2     Assessment: Right perinephric hematoma. Acute blood loss anemia.  Acute renal failure. Plan: -Continue q6 hour H&H through today. -Continue hydration. -Continue bedrest. -Will re-evaluate in 24 hours.  Rolan Bucco, MD [phone]

## 2011-04-01 NOTE — Progress Notes (Signed)
Subjective: No new complaints  Objective: Vital signs in last 24 hours: Temp:  [98.2 F (36.8 C)-98.9 F (37.2 C)] 98.9 F (37.2 C) (11/28 0441) Pulse Rate:  [80-88] 88  (11/28 0441) Resp:  [16-19] 16  (11/28 0441) BP: (113-149)/(52-71) 126/67 mmHg (11/28 0441) SpO2:  [94 %-97 %] 97 % (11/28 0441) Weight change:  Last BM Date: 03/31/11  Intake/Output from previous day: 11/27 0701 - 11/28 0700 In: 2665 [I.V.:2315; Blood:350] Out: 2300 [Urine:2300] Intake/Output this shift: Total I/O In: 2315 [I.V.:2315] Out: 1300 [Urine:1300]  General appearance: alert Resp: clear to auscultation bilaterally Cardio: regular rate and rhythm, S1, S2 normal, no murmur, click, rub or gallop GI: soft, non-tender; bowel sounds normal; no masses,  no organomegaly Extremities: edema 1 +.  R ankle bulla ruptured  Lab Results:  [facility] 04/01/11 0335 03/31/11 2145 03/31/11 1015  WBC 10.2 -- 11.3*  HGB 8.3* 8.1* --  HCT 24.9* 24.0* --  PLT 337 -- 280   BMET  Basename 04/01/11 0335 03/31/11 1015  NA 141 139  K 4.4 4.2  CL 108 107  CO2 24 21  GLUCOSE 113* 123*  BUN 87* 91*  CREATININE 3.16* 3.42*  CALCIUM 8.9 8.7    Studies/Results: Ct Abdomen Pelvis Wo Contrast  03/31/2011  *RADIOLOGY REPORT*  Clinical Data: Recent ESWL.  Anemia.  Recent fall.  Pain.  CT ABDOMEN AND PELVIS WITHOUT CONTRAST  Technique:  Multidetector CT imaging of the abdomen and pelvis was performed following the standard protocol without intravenous contrast.  Comparison: Ultrasound 03/30/2011  Findings: There is a right perinephric hematoma approximately 21 mm in thickness.  This extends inferiorly into the right retroperitoneum anterior to the right psoas muscle.  This is likely related to recent ESWL.  Nonobstructing right renal stones are present.  No hydronephrosis.  No ureteral stones.  Foley catheter is in place.  There is bladder wall thickening. No free air or free fluid.  Liver, spleen, pancreas, adrenals have an  unremarkable unenhanced appearance.  Left kidney is mildly atrophic with cortical thinning. No hydronephrosis.  There are gallstones seen within the gallbladder neck. Calcifications are also noted likely within the cystic duct.  No gallbladder distention or obvious wall thickening or inflammation currently.  Large and small bowel are grossly unremarkable.  IMPRESSION: Moderate sized right perinephric hematoma and right retroperitoneal hematoma.  Cholelithiasis.  Findings suspicious for cystic duct stones.  No CT evidence for acute cholecystitis.  Bladder wall thickening.  Bladder decompressed with Foley catheter in place.  Bibasilar atelectasis.  These results were discussed with Dr. Lavone Orn the time of interpretation.  Original Report Authenticated By: Raelyn Number, M.D.   Dg Sacrum/coccyx  03/30/2011  *RADIOLOGY REPORT*  Clinical Data: [place].  Pelvic pain.  SACRUM AND COCCYX - 2+ VIEW  Comparison: None  Findings: The pubic symphysis and SI joints are intact.  No obvious fracture or destructive bony change.  IMPRESSION: No acute bony findings or destructive bony changes.  Original Report Authenticated By: P. Kalman Jewels, M.D.   Dg Ankle Complete Right  03/30/2011  *RADIOLOGY REPORT*  Clinical Data: Large bullous lesion of the ankle.  The patient fell last week.  RIGHT ANKLE - COMPLETE 3+ VIEW  Comparison: None.  Findings: There is diffuse soft tissue edema.  Prominent focal edema identified in the region of the heel, as per history. Within this region, no evidence for soft tissue gas or radiopaque foreign body.  There is no evidence for acute fracture or dislocation. Suspect prior  fibular fracture.  There is a well corticated bone density adjacent to the distal tibia, consistent with old injury.  IMPRESSION:  1.  Suspect old fibular fracture. 2. Soft tissue edema in the region of the heel, without associated soft tissue gas or foreign body. 3.  No evidence for acute fracture.  Original Report  Authenticated By: Glenice Bow, M.D.   US Renal  03/30/2011  *RADIOLOGY REPORT*  Clinical Data: Acute renal failure.  RENAL/URINARY TRACT ULTRASOUND COMPLETE  Comparison:  Prior CT dated 03/03/2011 and renal ultrasound dated 11/15/2009.  Findings:  Right Kidney:  The right kidney measures approximately 11.7 cm in length.  There is suggestion of dilatation of the collecting system of mild degree.  This kidney previously contained a renal calculus by recent CT and the patient also had lithotripsy 1 week ago.  It is possible that stone fragments may be causing some relative ureteral obstruction.  Left Kidney:  The left kidney measures approximately 11.7 cm. Kidney demonstrates diffuse cortical atrophy.  No hydronephrosis on the left.  Bladder:  The bladder is mildly distended and unremarkable in appearance.  IMPRESSION: Suggestion of mild hydronephrosis of the right kidney.  Given history of renal calculus and recent lithotripsy, there may be some degree of ureteral obstruction. Unenhanced CT of the abdomen and pelvis may help to localize stone fragments.  Original Report Authenticated By: Azzie Roup, M.D.   Dg Pneumonia Chest 1v  03/30/2011  *RADIOLOGY REPORT*  Clinical Data: Cough; history smoking and diabetes.  CHEST - 1 VIEW  Comparison: Chest radiograph performed 10/20/2004  Findings: The lungs are well-aerated.  Mild vascular crowding is noted.  There is no evidence of focal opacification, pleural effusion or pneumothorax.  The cardiomediastinal silhouette is mildly enlarged.  No acute osseous abnormalities are seen.  IMPRESSION: Mild vascular congestion and mild cardiomegaly noted; lungs remain grossly clear.  Original Report Authenticated By: Santa Lighter, M.D.    Medications: I have reviewed the patient's current medications.  Assessment/Plan: Principal Problem:  *Anemia  acute blood loss anemia from perinephric and retroperitoneal hematoma, hgb stable      Supportive care Active  Problems:  Diabetes mellitus CBGs well controlled      Continue SSI  UTI (lower urinary tract infection)  Staph Aureus      Change antibiotics when sensitivity available  Sacral decubitus ulcer        Management per wound care  Ulcer of ankle  Acute Renal Failure  Slow improvement, no obstruction, likely ATN secondary to blood loss/hypotension      IVFs , hold ACEI/diuretic PT consult tomorrow when off bedrest.  May need short term rehab.   LOS: 2 days   [facility] 04/01/2011, 6:39 AM

## 2011-04-01 NOTE — Progress Notes (Signed)
CRITICAL VALUE ALERT  Critical value received: Urine Culture MRSA positive  Date of notification:  04/01/11  Time of notification: [id number]  Critical value read back:yes  Nurse who received alert:  Jyl Heinz  MD notified (1st page):  [phone]  Time of first page: 1355  MD notified (2nd page):  Time of second page:  Responding MD:  Dr. Laurann Montana  Time MD responded:[id number]

## 2011-04-01 NOTE — Progress Notes (Signed)
CM consult done. See CM notes in shadow chart.  Sherrin Daisy [facility] RN BSN CCM [phone] 04/01/2011

## 2011-04-01 NOTE — Progress Notes (Signed)
GU Progress  Patient doing well. Surrounded by family.  Hemoglobin tonight stable.  A/P: Right perinephric hematoma -Will discontinue q6 hour hemoglobin. -Will reassess in the morning.

## 2011-04-02 DIAGNOSIS — N179 Acute kidney failure, unspecified: Secondary | ICD-10-CM | POA: Diagnosis present

## 2011-04-02 LAB — BASIC METABOLIC PANEL
BUN: 68 mg/dL — ABNORMAL HIGH (ref 6–23)
CO2: 23 mEq/L (ref 19–32)
Calcium: 8.4 mg/dL (ref 8.4–10.5)
Chloride: 112 mEq/L (ref 96–112)
Creatinine, Ser: 2.49 mg/dL — ABNORMAL HIGH (ref 0.50–1.35)

## 2011-04-02 LAB — CBC
HCT: 24.9 % — ABNORMAL LOW (ref 39.0–52.0)
MCHC: 32.9 g/dL (ref 30.0–36.0)
MCV: 92.9 fL (ref 78.0–100.0)
Platelets: 353 10*3/uL (ref 150–400)
RDW: 15.4 % (ref 11.5–15.5)
WBC: 10.5 10*3/uL (ref 4.0–10.5)

## 2011-04-02 LAB — DIFFERENTIAL
Basophils Absolute: 0 10*3/uL (ref 0.0–0.1)
Basophils Relative: 0 % (ref 0–1)
Eosinophils Absolute: 0.5 10*3/uL (ref 0.0–0.7)
Eosinophils Relative: 5 % (ref 0–5)
Monocytes Absolute: 0.8 10*3/uL (ref 0.1–1.0)

## 2011-04-02 LAB — GLUCOSE, CAPILLARY
Glucose-Capillary: 186 mg/dL — ABNORMAL HIGH (ref 70–99)
Glucose-Capillary: 186 mg/dL — ABNORMAL HIGH (ref 70–99)
Glucose-Capillary: 175 mg/dL — ABNORMAL HIGH (ref 70–99)

## 2011-04-02 LAB — VANCOMYCIN, TROUGH: Vancomycin Tr: 15.3 ug/mL (ref 10.0–20.0)

## 2011-04-02 MED ORDER — CHLORHEXIDINE GLUCONATE CLOTH 2 % EX PADS
6.0000 | MEDICATED_PAD | Freq: Every day | CUTANEOUS | Status: AC
  Administered 2011-04-02 – 2011-04-06 (×5): 6 via TOPICAL

## 2011-04-02 MED ORDER — GLIMEPIRIDE 2 MG PO TABS
2.0000 mg | ORAL_TABLET | Freq: Every day | ORAL | Status: DC
  Administered 2011-04-02 – 2011-04-05 (×4): 2 mg via ORAL
  Filled 2011-04-02 (×5): qty 1

## 2011-04-02 MED ORDER — MUPIROCIN 2 % EX OINT
1.0000 "application " | TOPICAL_OINTMENT | Freq: Two times a day (BID) | CUTANEOUS | Status: DC
  Administered 2011-04-02 – 2011-04-06 (×9): 1 via NASAL
  Filled 2011-04-02: qty 22

## 2011-04-02 NOTE — Progress Notes (Signed)
ANTIBIOTIC CONSULT NOTE - FOLLOW UP  Pharmacy Consult for Vancomycin Indication: MRSA UTI/infected decubitus ulcer  No Known Allergies  Patient Measurements: Height: 6\' 4"  (193 cm) Weight: 205 lb (92.987 kg) IBW/kg (Calculated) : 86.8   Vital Signs: Temp: 98.9 F (37.2 C) (11/29 1341) Temp src: Oral (11/29 1341) BP: 135/67 mmHg (11/29 1341) Pulse Rate: 70  (11/29 1341) Intake/Output from previous day: 11/28 0701 - 11/29 0700 In: 2086.3 [P.O.:480; I.V.:1606.3] Out: 2600 [Urine:2600] Intake/Output from this shift: Total I/O In: 240 [P.O.:240] Out: 1000 [Urine:1000]  Labs:  Basename 04/02/11 0324 04/01/11 1750 04/01/11 1159 04/01/11 0335 03/31/11 1015  WBC 10.5 -- -- 10.2 11.3*  HGB 8.2* 9.1* 8.2* -- --  PLT 353 -- -- 337 280  LABCREA -- -- -- -- --  CREATININE 2.49* -- -- 3.16* 3.42*   Estimated Creatinine Clearance: 34.4 ml/min (by C-G formula based on Cr of 2.49).  Basename 04/02/11 1505  VANCOTROUGH 15.3  VANCOPEAK --  VANCORANDOM --  GENTTROUGH --  GENTPEAK --  GENTRANDOM --  TOBRATROUGH --  TOBRAPEAK --  TOBRARND --  AMIKACINPEAK --  AMIKACINTROU --  AMIKACIN --     Microbiology: Recent Results (from the past 720 hour(s))  MRSA PCR SCREENING     Status: Abnormal   Collection Time   03/24/11  6:03 AM      Component Value Range Status Comment   MRSA by PCR POSITIVE (*) NEGATIVE  Final   URINE CULTURE     Status: Normal   Collection Time   03/30/11  2:27 PM      Component Value Range Status Comment   Specimen Description URINE, CATHETERIZED   Final    Special Requests Normal   Final    Setup Time [id number]   Final    Colony Count >=100,000 COLONIES/ML   Final    Culture     Final    Value: METHICILLIN RESISTANT STAPHYLOCOCCUS AUREUS     Note: RIFAMPIN AND GENTAMICIN SHOULD NOT BE USED AS SINGLE DRUGS FOR TREATMENT OF STAPH INFECTIONS. CRITICAL RESULT CALLED TO, READ BACK BY AND VERIFIED WITH: KIM O @ [employer] 04/01/11   Report Status  04/01/2011 FINAL   Final    Organism ID, Bacteria METHICILLIN RESISTANT STAPHYLOCOCCUS AUREUS   Final   CULTURE, BLOOD (ROUTINE X 2)     Status: Normal (Preliminary result)   Collection Time   03/30/11  9:30 PM      Component Value Range Status Comment   Specimen Description BLOOD RIGHT ANTECUBITAL   Final    Special Requests BOTTLES DRAWN AEROBIC AND ANAEROBIC 5ML   Final    Setup Time [id number]   Final    Culture     Final    Value:        BLOOD CULTURE RECEIVED NO GROWTH TO DATE CULTURE WILL BE HELD FOR 5 DAYS BEFORE ISSUING A FINAL NEGATIVE REPORT   Report Status PENDING   Incomplete   CULTURE, BLOOD (ROUTINE X 2)     Status: Normal (Preliminary result)   Collection Time   03/30/11  9:35 PM      Component Value Range Status Comment   Specimen Description BLOOD R HAND   Final    Special Requests BOTTLES DRAWN AEROBIC AND ANAEROBIC 2ML   Final    Setup Time [id number]   Final    Culture     Final    Value:        BLOOD CULTURE RECEIVED NO  GROWTH TO DATE CULTURE WILL BE HELD FOR 5 DAYS BEFORE ISSUING A FINAL NEGATIVE REPORT   Report Status PENDING   Incomplete   MRSA PCR SCREENING     Status: Abnormal   Collection Time   03/31/11 10:36 AM      Component Value Range Status Comment   MRSA by PCR POSITIVE (*) NEGATIVE  Final     Anti-infectives     Start     Dose/Rate Route Frequency Ordered Stop   04/01/11 2200   vancomycin (VANCOCIN) IVPB 1000 mg/200 mL premix  Status:  Discontinued        1,000 mg 200 mL/hr over 60 Minutes Intravenous Every 48 hours 03/30/11 2243 03/31/11 1330   03/31/11 1600   vancomycin (VANCOCIN) 750 mg in sodium chloride 0.9 % 150 mL IVPB        750 mg 150 mL/hr over 60 Minutes Intravenous Every 24 hours 03/31/11 1331     03/31/11 1500   piperacillin-tazobactam (ZOSYN) IVPB 3.375 g  Status:  Discontinued        3.375 g 12.5 mL/hr over 240 Minutes Intravenous 3 times per day 03/31/11 1331 04/01/11 1613   03/31/11 0600   piperacillin-tazobactam  (ZOSYN) IVPB 2.25 g  Status:  Discontinued        2.25 g 100 mL/hr over 30 Minutes Intravenous Every 8 hours 03/30/11 2243 03/31/11 1330   03/31/11 0059   ciprofloxacin (CIPRO) tablet 500 mg  Status:  Discontinued        500 mg Oral 2 times daily 03/31/11 0059 03/31/11 0648   03/30/11 2200   vancomycin (VANCOCIN) IVPB 1000 mg/200 mL premix        1,000 mg 200 mL/hr over 60 Minutes Intravenous To Emergency Dept 03/30/11 2158 03/31/11 0043   03/30/11 2200   piperacillin-tazobactam (ZOSYN) IVPB 3.375 g  Status:  Discontinued        3.375 g 100 mL/hr over 30 Minutes Intravenous 3 times per day 03/30/11 2158 03/31/11 0232   03/30/11 2130   ciprofloxacin (CIPRO) IVPB 400 mg  Status:  Discontinued        400 mg 200 mL/hr over 60 Minutes Intravenous Every 12 hours 03/30/11 2116 03/30/11 2128          Assessment: Vancomycin trough slightly above goal but renal function is improving.   Goal of Therapy:  Vancomycin trough level 10-15 mcg/ml  Plan:  Continue Vancomycin 750mg  IV q24 for now. Consider another trough soon if therapy continues.  Lolita Patella 04/02/2011,5:59 PM

## 2011-04-02 NOTE — Progress Notes (Signed)
ANTIBIOTIC CONSULT NOTE - FOLLOW UP  Pharmacy Consult for Vancomycin/Zosyn Indication: MRSA UTI/infected decubitus ulcer  No Known Allergies  Patient Measurements: Height: 6\' 4"  (193 cm) Weight: 205 lb (92.987 kg) IBW/kg (Calculated) : 86.8   Vital Signs: Temp: 99.4 F (37.4 C) (11/29 0603) Temp src: Oral (11/29 0603) BP: 133/68 mmHg (11/29 0603) Pulse Rate: 89  (11/29 0603) Intake/Output from previous day: 11/28 0701 - 11/29 0700 In: 2086.3 [P.O.:480; I.V.:1606.3] Out: 2600 [Urine:2600] Intake/Output from this shift:    Labs:  Basename 04/02/11 0324 04/01/11 1750 04/01/11 1159 04/01/11 0335 03/31/11 1015  WBC 10.5 -- -- 10.2 11.3*  HGB 8.2* 9.1* 8.2* -- --  PLT 353 -- -- 337 280  LABCREA -- -- -- -- --  CREATININE 2.49* -- -- 3.16* 3.42*   Estimated Creatinine Clearance: 34.4 ml/min (by C-G formula based on Cr of 2.49). No results found for this basename: VANCOTROUGH:2,VANCOPEAK:2,VANCORANDOM:2,GENTTROUGH:2,GENTPEAK:2,GENTRANDOM:2,TOBRATROUGH:2,TOBRAPEAK:2,TOBRARND:2,AMIKACINPEAK:2,AMIKACINTROU:2,AMIKACIN:2, in the last 72 hours   Microbiology: Recent Results (from the past 720 hour(s))  MRSA PCR SCREENING     Status: Abnormal   Collection Time   03/24/11  6:03 AM      Component Value Range Status Comment   MRSA by PCR POSITIVE (*) NEGATIVE  Final   URINE CULTURE     Status: Normal   Collection Time   03/30/11  2:27 PM      Component Value Range Status Comment   Specimen Description URINE, CATHETERIZED   Final    Special Requests Normal   Final    Setup Time [id number]   Final    Colony Count >=100,000 COLONIES/ML   Final    Culture     Final    Value: METHICILLIN RESISTANT STAPHYLOCOCCUS AUREUS     Note: RIFAMPIN AND GENTAMICIN SHOULD NOT BE USED AS SINGLE DRUGS FOR TREATMENT OF STAPH INFECTIONS. CRITICAL RESULT CALLED TO, READ BACK BY AND VERIFIED WITH: KIM O @ [employer] 04/01/11   Report Status 04/01/2011 FINAL   Final    Organism ID, Bacteria  METHICILLIN RESISTANT STAPHYLOCOCCUS AUREUS   Final   CULTURE, BLOOD (ROUTINE X 2)     Status: Normal (Preliminary result)   Collection Time   03/30/11  9:30 PM      Component Value Range Status Comment   Specimen Description BLOOD RIGHT ANTECUBITAL   Final    Special Requests BOTTLES DRAWN AEROBIC AND ANAEROBIC 5ML   Final    Setup Time [id number]   Final    Culture     Final    Value:        BLOOD CULTURE RECEIVED NO GROWTH TO DATE CULTURE WILL BE HELD FOR 5 DAYS BEFORE ISSUING A FINAL NEGATIVE REPORT   Report Status PENDING   Incomplete   CULTURE, BLOOD (ROUTINE X 2)     Status: Normal (Preliminary result)   Collection Time   03/30/11  9:35 PM      Component Value Range Status Comment   Specimen Description BLOOD R HAND   Final    Special Requests BOTTLES DRAWN AEROBIC AND ANAEROBIC 2ML   Final    Setup Time [id number]   Final    Culture     Final    Value:        BLOOD CULTURE RECEIVED NO GROWTH TO DATE CULTURE WILL BE HELD FOR 5 DAYS BEFORE ISSUING A FINAL NEGATIVE REPORT   Report Status PENDING   Incomplete   MRSA PCR SCREENING     Status: Abnormal  Collection Time   03/31/11 10:36 AM      Component Value Range Status Comment   MRSA by PCR POSITIVE (*) NEGATIVE  Final     Anti-infectives     Start     Dose/Rate Route Frequency Ordered Stop   04/01/11 2200   vancomycin (VANCOCIN) IVPB 1000 mg/200 mL premix  Status:  Discontinued        1,000 mg 200 mL/hr over 60 Minutes Intravenous Every 48 hours 03/30/11 2243 03/31/11 1330   03/31/11 1600   vancomycin (VANCOCIN) 750 mg in sodium chloride 0.9 % 150 mL IVPB        750 mg 150 mL/hr over 60 Minutes Intravenous Every 24 hours 03/31/11 1331     03/31/11 1500   piperacillin-tazobactam (ZOSYN) IVPB 3.375 g  Status:  Discontinued        3.375 g 12.5 mL/hr over 240 Minutes Intravenous 3 times per day 03/31/11 1331 04/01/11 1613   03/31/11 0600   piperacillin-tazobactam (ZOSYN) IVPB 2.25 g  Status:  Discontinued         2.25 g 100 mL/hr over 30 Minutes Intravenous Every 8 hours 03/30/11 2243 03/31/11 1330   03/31/11 0059   ciprofloxacin (CIPRO) tablet 500 mg  Status:  Discontinued        500 mg Oral 2 times daily 03/31/11 0059 03/31/11 0648   03/30/11 2200   vancomycin (VANCOCIN) IVPB 1000 mg/200 mL premix        1,000 mg 200 mL/hr over 60 Minutes Intravenous To Emergency Dept 03/30/11 2158 03/31/11 0043   03/30/11 2200   piperacillin-tazobactam (ZOSYN) IVPB 3.375 g  Status:  Discontinued        3.375 g 100 mL/hr over 30 Minutes Intravenous 3 times per day 03/30/11 2158 03/31/11 0232   03/30/11 2130   ciprofloxacin (CIPRO) IVPB 400 mg  Status:  Discontinued        400 mg 200 mL/hr over 60 Minutes Intravenous Every 12 hours 03/30/11 2116 03/30/11 2128          Assessment: Day #4 Vancomycin and Zosyn for MRSA UTI.  Agree with potential switch of IV Vancomycin to PO Septra when possible.   Goal of Therapy:  Vancomycin trough level 10-15 mcg/ml  Plan:  1. Continue Vancomycin 750mg  IV q24 for now. Will need to check a trough tonight prior to next dose 2. Continue Zosyn 3.375g IV q8 (extended interval infusion) for now - question whether it can be discontinued now with isolation of MRSA only in urine?  Kara Mead 04/02/2011,9:18 AM

## 2011-04-02 NOTE — Progress Notes (Signed)
CM saw patient 04/01/2011. Per patient plans were incomplete at that time and would depend upon his progression. Pt is from home in [place] where he lives with spouse. He did state that if he went home and required hh services he would want [facility]. Pt states he has motorized scooter and that home is handicapped equipped. Pt eval pending. CM will follow as needed

## 2011-04-02 NOTE — Progress Notes (Signed)
Urology Progress Note  Subjective:     No acute urologic events overnight. No abdominal pain.  Objective:  Patient Vitals for the past 24 hrs:  BP Temp Temp src Pulse Resp SpO2  04/02/11 0603 133/68 mmHg 99.4 F (37.4 C) Oral 89  18  95 %  04/01/11 2100 128/78 mmHg 98.8 F (37.1 C) Oral 82  18  94 %  04/01/11 1344 118/55 mmHg 98.9 F (37.2 C) Oral 81  18  96 %    Physical Exam: General:  No acute distress, awake Cardiovascular:    [x]   S1/S2 present, RRR  []   Irregularly irregular Chest:  CTA-B Abdomen:               []  Soft, appropriately TTP  [x]  Soft, NTTP  []  Soft, appropriately TTP, incision(s) clean/dry/intact  Genitourinary: Foley draining clear yellow urine.     I/O last 3 completed shifts: In: 3145 [P.O.:480; I.V.:2315; Blood:350] Out: 4000 [Urine:4000]  Recent Labs  [facility] 04/02/11 0324 04/01/11 1750 04/01/11 0335   HGB 8.2* 9.1* --   WBC 10.5 -- 10.2   PLT 353 -- 337    Recent Labs  Basename 04/02/11 0324 04/01/11 0335   NA 145 141   K 4.4 4.4   CL 112 108   CO2 23 24   BUN 68* 87*   CREATININE 2.49* 3.16*   CALCIUM 8.4 8.9   GFRNONAA 25* 19*   GFRAA 29* 22*     No results found for this basename: PT:2,INR:2,APTT:2 in the last 72 hours   No components found with this basename: ABG:2     Assessment: Right perinephric hematoma.  Acute renal failure. Plan: -Hemoglobin stable.  Ok to start physical therapy. -Renal function continues to improve.  Continue IV hydration. -Would continue foley catheter as patient too weak to self-cath at this time.  He will know when he is able to resume self-cath.  Until that time, would make sure catheter is changed every 4 weeks; though I do not anticipate that it will need to remain in place that long. -Will continue to follow.   Rolan Bucco, MD [phone]

## 2011-04-02 NOTE — Progress Notes (Signed)
Subjective: No new complaints  Objective: Vital signs in last 24 hours: Temp:  [98.8 F (37.1 C)-99.4 F (37.4 C)] 99.4 F (37.4 C) (11/29 0603) Pulse Rate:  [81-89] 89  (11/29 0603) Resp:  [18] 18  (11/29 0603) BP: (118-133)/(55-78) 133/68 mmHg (11/29 0603) SpO2:  [94 %-96 %] 95 % (11/29 0603) Weight change:  Last BM Date: 04/01/11  Intake/Output from previous day: 11/28 0701 - 11/29 0700 In: 2086.3 [P.O.:480; I.V.:1606.3] Out: 2600 [Urine:2600] Intake/Output this shift:    General appearance: alert Resp: clear to auscultation bilaterally Cardio: regular rate and rhythm, S1, S2 normal, no murmur, click, rub or gallop GI: soft, non-tender; bowel sounds normal; no masses,  no organomegaly  Lab Results:  [facility] 04/02/11 0324 04/01/11 1750 04/01/11 0335  WBC 10.5 -- 10.2  HGB 8.2* 9.1* --  HCT 24.9* 26.1* --  PLT 353 -- 337   BMET  Basename 04/02/11 0324 04/01/11 0335  NA 145 141  K 4.4 4.4  CL 112 108  CO2 23 24  GLUCOSE 141* 113*  BUN 68* 87*  CREATININE 2.49* 3.16*  CALCIUM 8.4 8.9    Studies/Results: Ct Abdomen Pelvis Wo Contrast  03/31/2011  *RADIOLOGY REPORT*  Clinical Data: Recent ESWL.  Anemia.  Recent fall.  Pain.  CT ABDOMEN AND PELVIS WITHOUT CONTRAST  Technique:  Multidetector CT imaging of the abdomen and pelvis was performed following the standard protocol without intravenous contrast.  Comparison: Ultrasound 03/30/2011  Findings: There is a right perinephric hematoma approximately 21 mm in thickness.  This extends inferiorly into the right retroperitoneum anterior to the right psoas muscle.  This is likely related to recent ESWL.  Nonobstructing right renal stones are present.  No hydronephrosis.  No ureteral stones.  Foley catheter is in place.  There is bladder wall thickening. No free air or free fluid.  Liver, spleen, pancreas, adrenals have an unremarkable unenhanced appearance.  Left kidney is mildly atrophic with cortical thinning. No  hydronephrosis.  There are gallstones seen within the gallbladder neck. Calcifications are also noted likely within the cystic duct.  No gallbladder distention or obvious wall thickening or inflammation currently.  Large and small bowel are grossly unremarkable.  IMPRESSION: Moderate sized right perinephric hematoma and right retroperitoneal hematoma.  Cholelithiasis.  Findings suspicious for cystic duct stones.  No CT evidence for acute cholecystitis.  Bladder wall thickening.  Bladder decompressed with Foley catheter in place.  Bibasilar atelectasis.  These results were discussed with Dr. Lavone Orn the time of interpretation.  Original Report Authenticated By: Raelyn Number, M.D.    Medications: I have reviewed the patient's current medications.  Assessment/Plan: Principal Problem:  *Anemia  hgb stable      D/c serial hemoglobins, cbc daily Active Problems:  Acute renal failure (ARF) improving      Continue IVF  Diabetes mellitus sugars rising      Continue SSI, restart glimepiride  UTI (lower urinary tract infection)  MRSA, multiple renal stones      Continue Vancomycin, consider change to septra, need urology input on duration of antibiotics  Sacral decubitus ulcer      Covered with mepilex  Overlay mattress  Ulcer of ankle      prevalon boot, mepilex  PT and Case manager consults.  May need short term rehab stay prior to return home.  LOS: 3 days   Lance [name] JOSEPH 04/02/2011, 7:43 AM

## 2011-04-03 DIAGNOSIS — R5381 Other malaise: Secondary | ICD-10-CM

## 2011-04-03 LAB — TYPE AND SCREEN
Antibody Screen: NEGATIVE
Unit division: 0
Unit division: 0
Unit division: 0

## 2011-04-03 LAB — BASIC METABOLIC PANEL
CO2: 22 mEq/L (ref 19–32)
Calcium: 8.4 mg/dL (ref 8.4–10.5)
Creatinine, Ser: 1.97 mg/dL — ABNORMAL HIGH (ref 0.50–1.35)
GFR calc Af Amer: 38 mL/min — ABNORMAL LOW (ref >90)
GFR calc non Af Amer: 33 mL/min — ABNORMAL LOW (ref >90)
Sodium: 141 mEq/L (ref 135–145)

## 2011-04-03 LAB — DIFFERENTIAL
Basophils Absolute: 0 10*3/uL (ref 0.0–0.1)
Eosinophils Absolute: 0.4 10*3/uL (ref 0.0–0.7)
Eosinophils Relative: 3 % (ref 0–5)
Lymphocytes Relative: 15 % (ref 12–46)
Neutrophils Relative %: 75 % (ref 43–77)

## 2011-04-03 LAB — CBC
MCV: 93.4 fL (ref 78.0–100.0)
Platelets: 363 10*3/uL (ref 150–400)
RDW: 15.5 % (ref 11.5–15.5)
WBC: 10.6 10*3/uL — ABNORMAL HIGH (ref 4.0–10.5)

## 2011-04-03 LAB — GLUCOSE, CAPILLARY: Glucose-Capillary: 193 mg/dL — ABNORMAL HIGH (ref 70–99)

## 2011-04-03 MED ORDER — ENSURE CLINICAL ST REVIGOR PO LIQD
237.0000 mL | Freq: Three times a day (TID) | ORAL | Status: DC
  Administered 2011-04-04 – 2011-04-05 (×5): 237 mL via ORAL
  Administered 2011-04-05: 08:00:00 via ORAL
  Administered 2011-04-06 (×2): 237 mL via ORAL

## 2011-04-03 MED ORDER — JUVEN PO PACK
2.0000 | PACK | Freq: Two times a day (BID) | ORAL | Status: DC
  Administered 2011-04-03 – 2011-04-05 (×2): 2 via ORAL
  Filled 2011-04-03 (×7): qty 2

## 2011-04-03 MED ORDER — SULFAMETHOXAZOLE-TMP DS 800-160 MG PO TABS
1.0000 | ORAL_TABLET | Freq: Two times a day (BID) | ORAL | Status: DC
  Administered 2011-04-03 – 2011-04-06 (×7): 1 via ORAL
  Filled 2011-04-03 (×8): qty 1

## 2011-04-03 NOTE — Consult Note (Signed)
Physical Medicine and Rehabilitation Consult Reason for Consult: Weakness, UTI, ABLA Referring Phsyician: Dr. Arbutus Ped    HPI: Lance [name] is an 70 y.o. male with with chronic  Lattie Haw related to multiple sclerosis who was admitted for lithotripsy 11/19 and D/C next day  Readmitted  11/26 with weakness, inability to perform his electric wheel chair transfers and noted to be febrile in ED.  Patient with hgb of 6.2. CT abdomen with right perinephric hematoma and right retroperitoneal hematoma.  Started on antibiotics for UTI and treated with 2units PRBCs.  Acute renal failure ( ATN  due to hypotension and meds ) treated with  IVF.  Sacral decub monitored by WOC.  Has been on bedrest till today. Noted to be deconditioned.  MD, PT recommending CIR.  Review of Systems  HENT: Negative.   Eyes: Negative.   Respiratory: Negative.   Cardiovascular: Negative.   Gastrointestinal: Positive for constipation.  Genitourinary: Positive for frequency.  Musculoskeletal: Positive for joint pain.       Pedal edema bilaterally.    Skin:       Bilateral heels with blisters.  Sacral decub.  Neurological: Positive for sensory change, focal weakness and weakness.       RLE> LLE weakness.  Sensory deficit RLE.  Spasticity BLE   Endo/Heme/Allergies: Negative.   Psychiatric/Behavioral: Negative.    Past Medical History  Diagnosis Date  . Hypertension   . Blood transfusion   . Neuralgia neuritis, sciatic nerve     MS for 30 years  . Diabetes mellitus   . PONV (postoperative nausea and vomiting)    Past Surgical History  Procedure Date  . Tonsillectomy   . Finger surgery 2004   History reviewed. No pertinent family history.  Social History:Married.  Lives with wife and son.  Wife works.  reports that he has quit smoking. His smoking use included Cigars. He has never used smokeless tobacco. He reports that he drinks about .6 ounces of alcohol per week. He reports that he does not use illicit drugs.   Reports independent with transfers PTA.  Was able to stand and take a couple of steps only.  Used scooter most of the time.    No Known Allergies  Prior to Admission medications   Medication Sig Start Date End Date Taking? Authorizing Provider  aspirin 81 MG tablet Take 1 tablet (81 mg total) by mouth daily. 03/30/11  Yes Molli Hazard, MD  ciprofloxacin (CIPRO) 500 MG tablet Take 500 mg by mouth 2 (two) times daily. Pt started on 03-06-11 for 7 days    Yes Historical Provider, MD  ciprofloxacin (CIPRO) 500 MG tablet Take 1 tablet (500 mg total) by mouth 2 (two) times daily. 03/23/11 04/02/11 Yes Molli Hazard, MD  fish oil-omega-3 fatty acids 1000 MG capsule Take 1 capsule (1 g total) by mouth daily. 03/30/11  Yes Molli Hazard, MD  glimepiride (AMARYL) 4 MG tablet Take 4 mg by mouth every morning.     Yes Historical Provider, MD  HYDROcodone-acetaminophen (NORCO) 5-325 MG per tablet Take 1-2 tablets by mouth every 4 (four) hours as needed for pain. 03/23/11 04/02/11 Yes Molli Hazard, MD  lisinopril-hydrochlorothiazide (PRINZIDE,ZESTORETIC) 20-25 MG per tablet Take 1 tablet by mouth every morning.     Yes Historical Provider, MD  methenamine (MANDELAMINE) 1 GM tablet Take 0.5 g by mouth daily.     Yes Historical Provider, MD  oxybutynin (DITROPAN) 5 MG tablet Take 5 mg by mouth 2 (two)  times daily.     Yes Historical Provider, MD  pioglitazone (ACTOS) 30 MG tablet Take 30 mg by mouth daily.     Yes Historical Provider, MD  simvastatin (ZOCOR) 20 MG tablet Take 20 mg by mouth at bedtime.     Yes Historical Provider, MD  Tamsulosin HCl (FLOMAX) 0.4 MG CAPS Take 1 capsule (0.4 mg total) by mouth at bedtime. 03/23/11  Yes Molli Hazard, MD  Cholecalciferol (VITAMIN D3) 2000 UNITS TABS Take 2,000 mg by mouth daily.      Historical Provider, MD   Scheduled Medications:    . Chlorhexidine Gluconate Cloth  6 each Topical Q0600  . glimepiride  2 mg Oral Q  breakfast  . insulin aspart  0-15 Units Subcutaneous TID WC  . insulin aspart  0-5 Units Subcutaneous QHS  . mupirocin ointment  1 application Nasal BID  . sulfamethoxazole-trimethoprim  1 tablet Oral Q12H  . Tamsulosin HCl  0.4 mg Oral QHS  . DISCONTD: vancomycin  750 mg Intravenous Q24H   PRN MED's: acetaminophen, acetaminophen, albuterol, HYDROcodone-acetaminophen, ondansetron (ZOFRAN) IV, ondansetron Home: Home Living Lives With: Spouse;Son (son has limitations and cannot provide much care) Receives Help From: Friend(s) Type of Home: [place]: One level Bathroom Accessibility: Yes How Accessible: Accessible via wheelchair (pt uses scooter) Home Adaptive Equipment: Transport planner (transfer board) Additional Comments: pt reports he has a cushion at home..not sure if it is a Music therapist History: Prior Function Level of Independence: Needs assistance with tranfers;Requires assistive device for independence;Needs assistance with ADLs;Other (comment) (occasional assist needed) Functional Status:  Mobility: Bed Mobility Rolling Right: 2: Max assist Rolling Right Details (indicate cue type and reason): needs assist for leg placement and cues to look with head, reach with arm for rail and max assist at hip to roll Rolling Left: 3: Mod assist Rolling Left Details (indicate cue type and reason):  needs assist to stabalize leg and mod assist to roll hip over Supine to Sit: 1: +2 Total assist (pt=0%) Supine to Sit Details (indicate cue type and reason): pt with anxiety about falling, need total assist to move legs and lift upper body Sit to Supine - Right: 1: +2 Total assist        ADL:    Cognition: Cognition Orientation Level: Oriented X4 Cognition Orientation Level: Oriented X4  Blood pressure 166/63, pulse 76, temperature 98.9 F (37.2 C), temperature source Oral, resp. rate 18, height 6\' 4"  (1.93 m), weight 92.987 kg (205 lb), SpO2 96.00%. General no  acute distress Lungs are clear Motor strength is 5 out of 5 bilateral deltoids biceps triceps and grip Lower extremity motor strength 2 minus hip flexion knee extension influenced by increased tone Spasticity noted bilateral lower extremity modified Ashworth score of 3 at the knee extensors and plantar extensors   sensation is reduced to light touch in bilateral lower activities but intact in bilateral upper extremities. Speech is without evidence of dysarthria or word finding difficulties Cranial nerves II through XII are intact Decreased active range of motion bilateral lower extremities and decreased passive range of motion bilateral ankles  Assessment/Plan: Diagnosis: Deconditioning after retroperitoneal and perinephric hemorrhage. 1. Does the need for close, 24 hr/day medical supervision in concert with the patient's rehab needs make it unreasonable for this patient to be served in a less intensive setting? Yes 2. 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 Due to bladder management, bowel management, safety, skin/wound care, disease management  and pain management, does the patient require 24 hr/day rehab nursing? Yes 4. Does the patient require coordinated care of a physician, rehab nurse, PT (1-2 hrs/day, 5 days/week) and OT (1-2 hrs/day, 5 days/week) to address physical and functional deficits in the context of the above medical diagnosis(es)? Yes Addressing deficits in the following areas: balance, bathing, dressing, endurance, feeding, grooming, locomotion, strength, toileting and transferring 5. Can the patient actively participate in an intensive therapy program of at least 3 hrs of therapy per day at least 5 days per week? Yes 6. The potential for patient to make measurable gains while on inpatient rehab is good 7. Anticipated functional outcomes upon discharge from inpatients are modified independent  wheelchair level mobility PT, modified independent wheelchair level ADLs OT, not applicable SLP 8. Estimated rehab length of stay to reach the above functional goals is: 10 days 9. Does the patient have adequate social supports to accommodate these discharge functional goals? Yes 10. Anticipated D/C setting: Home 11. Anticipated post D/C treatments: [place] therapy 12. Overall Rehab/Functional Prognosis: excellent  RECOMMENDATIONS: This patient's condition is appropriate for continued rehabilitative care in the following setting: CIR Patient has agreed to participate in recommended program. Yes Note that insurance prior authorization may be required for reimbursement for recommended care.  Comment:   Bary Leriche 04/03/2011

## 2011-04-03 NOTE — Progress Notes (Signed)
Subjective: No complaints  Objective: Vital signs in last 24 hours: Temp:  [98.8 F (37.1 C)-98.9 F (37.2 C)] 98.9 F (37.2 C) (11/30 0558) Pulse Rate:  [70-76] 76  (11/30 0558) Resp:  [18-19] 18  (11/30 0558) BP: (135-166)/(63-68) 166/63 mmHg (11/30 0558) SpO2:  [96 %-97 %] 96 % (11/30 0558) Weight change:  Last BM Date: 04/01/11  Intake/Output from previous day: 11/29 0701 - 11/30 0700 In: 2156.3 [P.O.:240; I.V.:1916.3] Out: 1700 [Urine:1700] Intake/Output this shift:    General appearance: alert Resp: clear to auscultation bilaterally Cardio: regular rate and rhythm, S1, S2 normal, no murmur, click, rub or gallop GI: soft, non-tender; bowel sounds normal; no masses,  no organomegaly  Lab Results:  Basename 04/03/11 0330 04/02/11 0324  WBC 10.6* 10.5  HGB 8.0* 8.2*  HCT 24.2* 24.9*  PLT 363 353   BMET  Basename 04/03/11 0330 04/02/11 0324  NA 141 145  K 4.1 4.4  CL 111 112  CO2 22 23  GLUCOSE 125* 141*  BUN 53* 68*  CREATININE 1.97* 2.49*  CALCIUM 8.4 8.4    Studies/Results: No results found.  Medications: I have reviewed the patient's current medications.  Assessment/Plan: Principal Problem:  *Anemia hgb stable Active Problems:  Acute renal failure (ARF) resolving      D/c IVFs, hold ACEI for now  Diabetes mellitus  Reasonable control      Continue amaryl and SSI, hold actos for now  UTI (lower urinary tract infection)      Change to po sulfa, Urology, how long will he need antibiotics  Sacral decubitus ulcer  Per wound care  Ulcer of ankle  PT to see today.  May need shortterm rehab   LOS: 4 days   Lance [name] JOSEPH 04/03/2011, 7:04 AM

## 2011-04-03 NOTE — Plan of Care (Signed)
Problem: Phase I Progression Outcomes Goal: OOB as tolerated unless otherwise ordered Outcome: Progressing Pt. Dangled at the bedside today with PT.

## 2011-04-03 NOTE — Progress Notes (Signed)
Physical Therapy Evaluation Patient Details Name: Lance [name] MRN: [id number] [name]: 1940/07/19 Today's Date: 04/03/2011 9:41-10:40 EVII  E  PT Discharge Recommendation:  CIR  Problem List:  Patient Active Problem List  Diagnoses  . Nephrolithiasis  . Multiple sclerosis  . Diabetes mellitus  . UTI (lower urinary tract infection)  . Sacral decubitus ulcer  . Ulcer of ankle  . Anemia  . Acute renal failure (ARF)    Past Medical History:  Past Medical History  Diagnosis Date  . Hypertension   . Blood transfusion   . Neuralgia neuritis, sciatic nerve     MS for 30 years  . Diabetes mellitus   . PONV (postoperative nausea and vomiting)    Past Surgical History:  Past Surgical History  Procedure Date  . Tonsillectomy   . Finger surgery 2004    PT Assessment/Plan/Recommendation PT Assessment Clinical Impression Statement: pt with deconditioning, essentially spastic paraparesis with some skin breakdown issues. Pt very motivated to return to his home and prior status and will tolerate 3 hours of therapy a day. Recommend CIR consult PT Recommendation/Assessment: Patient will need skilled PT in the acute care venue PT Problem List: Decreased strength;Decreased balance;Decreased mobility;Impaired sensation;Impaired tone;Decreased skin integrity Barriers to Discharge: Decreased caregiver support Barriers to Discharge Comments: Expect pt to improve with continued PT and return to home at Supervision level PT Therapy Diagnosis : Generalized weakness (spastic paraparesis, decreased sensation) PT Plan PT Frequency: Min 3X/week PT Treatment/Interventions: Functional mobility training;Therapeutic exercise;Balance training;Neuromuscular re-education;Patient/family education PT Recommendation Recommendations for Other Services: Rehab consult;OT consult Follow Up Recommendations: Inpatient Rehab Equipment Recommended: Defer to next venue PT Goals  Acute Rehab PT Goals PT Goal  Formulation: With patient Time For Goal Achievement: 2 weeks Pt will Roll Supine to Right Side: with mod assist PT Goal: Rolling Supine to Right Side - Progress: Not met Pt will Roll Supine to Left Side: with min assist PT Goal: Rolling Supine to Left Side - Progress: Not met Pt will go Supine/Side to Sit: with +2 total assist (pt = 50%) PT Goal: Supine/Side to Sit - Progress: Not met Pt will Sit at [facility] of Bed: with supervision;6-10 min;with unilateral upper extremity support PT Goal: Sit at Edge Of Bed - Progress: Not met Pt will Perform Home Exercise Program: with supervision, verbal cues required/provided PT Goal: Perform Home Exercise Program - Progress: Not met  PT Evaluation Precautions/Restrictions    Prior Functioning  Home Living Lives With: Spouse;Son (son has limitations and cannot provide much care) Receives Help From: Friend(s) Type of Home: House Home Layout: One level Bathroom Accessibility: Yes How Accessible: Accessible via wheelchair (pt uses scooter) Home Adaptive Equipment: Transport planner (transfer board) Additional Comments: pt reports he has a cushion at home..not sure if it is a Insurance claims handler Prior Function Level of Independence: Needs assistance with tranfers;Requires assistive device for independence;Needs assistance with ADLs;Other (comment) (occasional assist needed) Cognition Cognition Orientation Level: Oriented X4 Sensation/Coordination Sensation Light Touch: Impaired by gross assessment Stereognosis: Impaired by gross assessment Hot/Cold: Impaired by gross assessment Proprioception: Impaired by gross assessment Additional Comments: diminished sensation below chest level at both L/E's  Pt has developed pressure ulcers at right lower leg , heel and sacrum that he was unable to detect Coordination Gross Motor Movements are Fluid and Coordinated: Yes (in U/E's, not able to test in L/E's) Extremity Assessment RUE Assessment RUE Assessment: Within  Functional Limits LUE Assessment LUE Assessment: Within Functional Limits RLE Assessment RLE Assessment: Exceptions to [facility] RLE PROM (  degrees) Overall PROM Right Lower Extremity: Within functional limits for tasks assessed RLE Strength RLE Overall Strength: Due to premorbid status RLE Overall Strength Comments: pt was able to produce isometric contraction of abs, quads at about 1/5 strength level. not able to raise leg against gravity at all, no ankle motion seen RLE Tone RLE Tone: Hypertonic;Moderate Hypertonic Details: Clonus at ankle noted, some resistance to passive movement. pt noted to have stage 2 ulcer at heel and lateral leg, pt also with edema in foot: RN to elevate on pillows LLE Assessment LLE Assessment: Exceptions to WFL LLE PROM (degrees) LLE Overall PROM Comments: WFL LLE Strength LLE Overall Strength Comments: Pt only able to produce isometric contractions at abd, and quads, no activity at ankle, edema in ankle LLE Tone LLE Tone Comments: ankle clonus and resistance to passive movement noted Mobility (including Balance) Bed Mobility Rolling Right: 2: Max assist Rolling Right Details (indicate cue type and reason): needs assist for leg placement and cues to look with head, reach with arm for rail and max assist at hip to roll Rolling Left: 3: Mod assist Rolling Left Details (indicate cue type and reason):  needs assist to stabalize leg and mod assist to roll hip over Supine to Sit: 1: +2 Total assist (pt=0%) Supine to Sit Details (indicate cue type and reason): pt with anxiety about falling, need total assist to move legs and lift upper body Sit to Supine - Right: 1: +2 Total assist  Posture/Postural Control Posture/Postural Control: No significant limitations (core stability weakness, but able to activate) Static Sitting Balance Static Sitting - Balance Support: Bilateral upper extremity supported;Feet supported Static Sitting - Level of Assistance: 3: Mod  assist Static Sitting - Comment/# of Minutes: pt sat at EOB ~ 5 minutes.  Pt fearful of fall, held firmly to bedrails with both arms, able to do 5 repetitions of trunk extension exercises Exercise  Other Exercises Other Exercises: bilater U/E hip to hip for core activation X 5 reps Other Exercises: PROM to both lower extremities End of Session PT - End of Session Activity Tolerance: Patient tolerated treatment well Patient left: in bed Nurse Communication: Mobility status for transfers;Other (comment) (positioning for pressure relief and edema control) General Behavior During Session: [facility] for tasks performed Cognition: [facility] for tasks performed  Norwood Levo 04/03/2011, 11:43 AM

## 2011-04-03 NOTE — Progress Notes (Signed)
   Urology Progress Note  Subjective:     No acute urologic events overnight. Patient continues to recover renal function; hemoglobin remains stable.  Objective:  Patient Vitals for the past 24 hrs:  BP Temp Temp src Pulse Resp SpO2  04/03/11 0558 166/63 mmHg 98.9 F (37.2 C) Oral 76  18  96 %  04/02/11 2225 142/68 mmHg 98.8 F (37.1 C) Oral 75  19  97 %  04/02/11 1341 135/67 mmHg 98.9 F (37.2 C) Oral 70  18  96 %    Physical Exam: General:  No acute distress, awake Cardiovascular:    [x]   S1/S2 present, RRR  []   Irregularly irregular Chest:  CTA-B Abdomen:               []  Soft, appropriately TTP  [x]  Soft, NTTP  []  Soft, appropriately TTP, incision(s) clean/dry/intact  Genitourinary: Foley:    I/O last 3 completed shifts: In: 3762.5 [P.O.:240; I.V.:3522.5] Out: 2600 [Urine:2600]  Recent Labs  [facility] 04/03/11 0330 04/02/11 0324   HGB 8.0* 8.2*   WBC 10.6* 10.5   PLT 363 353    Recent Labs  Basename 04/03/11 0330 04/02/11 0324   NA 141 145   K 4.1 4.4   CL 111 112   CO2 22 23   BUN 53* 68*   CREATININE 1.97* 2.49*   CALCIUM 8.4 8.4   GFRNONAA 33* 25*   GFRAA 38* 29*     No results found for this basename: PT:2,INR:2,APTT:2 in the last 72 hours   No components found with this basename: ABG:2     Assessment: Right peri-nephric hematoma. Acute renal failure.  UTI. Plan: -Agree with transition to PO antibiotics.  Would treat for a total of 10 days (including time on IV antibiotics).  Would not give prophylactic antibiotics while catheter in place. -Patient will decide when he is strong enough for self-cath; he will contact my office for orders at that time if he is still in a facility and needs orders.  He should resume self-cath q4 hours while awake and before bed. -No further need to monitor hemoglobin unless there is a change in hemodynamic parameters. -Push PO fluids for renal recovery. -Patient has appointment to follow up with me 05/07/11 at  1 PM. -Will continue to follow while in hospital.  Rolan Bucco, MD [phone]

## 2011-04-03 NOTE — Progress Notes (Signed)
INITIAL ADULT NUTRITION ASSESSMENT Date: 04/03/2011   Time: 3:42 PM Reason for Assessment: RN referral for pressure ulcers  ASSESSMENT: Male 70 y.o.  Dx: Anemia  Hx:  Past Medical History  Diagnosis Date  . Hypertension   . Blood transfusion   . Neuralgia neuritis, sciatic nerve     MS for 30 years  . Diabetes mellitus   . PONV (postoperative nausea and vomiting)    Related Meds:  Scheduled Meds:   . Chlorhexidine Gluconate Cloth  6 each Topical Q0600  . glimepiride  2 mg Oral Q breakfast  . insulin aspart  0-15 Units Subcutaneous TID WC  . insulin aspart  0-5 Units Subcutaneous QHS  . mupirocin ointment  1 application Nasal BID  . sulfamethoxazole-trimethoprim  1 tablet Oral Q12H  . Tamsulosin HCl  0.4 mg Oral QHS  . DISCONTD: vancomycin  750 mg Intravenous Q24H   Continuous Infusions:   . DISCONTD: sodium chloride 75 mL/hr at 04/03/11 0433  . DISCONTD: sodium chloride 75 mL/hr at 03/31/11 0300   PRN Meds:.acetaminophen, acetaminophen, albuterol, HYDROcodone-acetaminophen, ondansetron (ZOFRAN) IV, ondansetron  Ht: 6\' 4"  (193 cm)  Wt: 205 lb (92.987 kg)  Ideal Wt: 91.8kg % Ideal Wt: 101  Usual Wt: 92.9kg % Usual Wt: 100  Body mass index is 24.95 kg/(m^2).  Food/Nutrition Related Hx: Pt reports eating well with stable weight PTA and following a diabetic diet. Pt reports good blood sugar control at home and normal HbA1c. Pt found to have multiple wounds and wound care nurse documented 3 stage 2 pressure ulcers at right heel, right outer leg, and right outer ankle. Pt currently eating well, 50-100% of meals. Pt not on any nutritional supplements PTA.   Labs:  CMP     Component Value Date/Time   NA 141 04/03/2011 0330   K 4.1 04/03/2011 0330   CL 111 04/03/2011 0330   CO2 22 04/03/2011 0330   GLUCOSE 125* 04/03/2011 0330   BUN 53* 04/03/2011 0330   CREATININE 1.97* 04/03/2011 0330   CALCIUM 8.4 04/03/2011 0330   PROT 6.3 03/31/2011 1015   ALBUMIN 2.2*  03/31/2011 1015   AST 46* 03/31/2011 1015   ALT 44 03/31/2011 1015   ALKPHOS 57 03/31/2011 1015   BILITOT 0.9 03/31/2011 1015   GFRNONAA 33* 04/03/2011 0330   GFRAA 38* 04/03/2011 0330   CBG (last 3)   Basename 04/03/11 1143 04/03/11 0858 04/02/11 2157  GLUCAP 183* 127* 175*    Intake/Output Summary (Last 24 hours) at 04/03/11 1546 Last data filed at 04/03/11 1355  Gross per 24 hour  Intake 2156.25 ml  Output   1400 ml  Net 756.25 ml    Diet Order: Carb Control   IVF:    DISCONTD: sodium chloride Last Rate: 75 mL/hr at 04/03/11 0433  DISCONTD: sodium chloride Last Rate: 75 mL/hr at 03/31/11 0300    Estimated Nutritional Needs:   Kcal:2500-2750 Protein:110-140g Fluid:2.5-2.7L  NUTRITION DIAGNOSIS: -Increased nutrient needs (NI-5.1).  Status: Ongoing  RELATED TO: multiple pressure ulcers  AS EVIDENCE BY: wound RN documentation  MONITORING/EVALUATION(Goals): Pt to consume >90% of meals and supplements.   EDUCATION NEEDS: -Education needs addressed - educated pt on high protein foods and provided handout of this information.   INTERVENTION: Chocolate Ensure Clinical Strength TID. Juven 1 packet BID. Encouraged continued increased intake. Will monitor.   Dietitian # [phone]  DOCUMENTATION CODES Per approved criteria  -Not Applicable    Glory Rosebush 04/03/2011, 3:42 PM

## 2011-04-04 LAB — BASIC METABOLIC PANEL
CO2: 22 mEq/L (ref 19–32)
Chloride: 107 mEq/L (ref 96–112)
GFR calc Af Amer: 39 mL/min — ABNORMAL LOW (ref >90)
Potassium: 4.2 mEq/L (ref 3.5–5.1)

## 2011-04-04 LAB — CBC
HCT: 25.1 % — ABNORMAL LOW (ref 39.0–52.0)
Hemoglobin: 8.2 g/dL — ABNORMAL LOW (ref 13.0–17.0)
RBC: 2.66 MIL/uL — ABNORMAL LOW (ref 4.22–5.81)
WBC: 10.9 10*3/uL — ABNORMAL HIGH (ref 4.0–10.5)

## 2011-04-04 LAB — DIFFERENTIAL
Basophils Absolute: 0 10*3/uL (ref 0.0–0.1)
Basophils Relative: 0 % (ref 0–1)
Eosinophils Relative: 2 % (ref 0–5)
Lymphocytes Relative: 13 % (ref 12–46)
Monocytes Relative: 4 % (ref 3–12)
Neutro Abs: 8.9 10*3/uL — ABNORMAL HIGH (ref 1.7–7.7)

## 2011-04-04 LAB — GLUCOSE, CAPILLARY
Glucose-Capillary: 176 mg/dL — ABNORMAL HIGH (ref 70–99)
Glucose-Capillary: 111 mg/dL — ABNORMAL HIGH (ref 70–99)
Glucose-Capillary: 260 mg/dL — ABNORMAL HIGH (ref 70–99)

## 2011-04-04 NOTE — Progress Notes (Signed)
Subjective: Pleasant no apparent distress, looking for to going to rehabilitation. His appetite is still slow to pick up. He understands he needs to eat in order to help his won't heal. No to been reviewed from urology and rehabilitation services. According to nurse his IV is not working, currently it appears patient is not receiving anything IV, for now we will leave it out  Objective: Vital signs in last 24 hours: Temp:  [98.2 F (36.8 C)-99.1 F (37.3 C)] 99.1 F (37.3 C) (12/01 0542) Pulse Rate:  [70-87] 87  (12/01 0542) Resp:  [16-18] 16  (12/01 0542) BP: (130-146)/(55-62) 136/62 mmHg (12/01 0542) SpO2:  [93 %-97 %] 93 % (12/01 0542) Weight change:  Last BM Date: 04/03/11  Intake/Output from previous day: 11/30 0701 - 12/01 0700 In: 240 [P.O.:240] Out: 1950 [Urine:1950] Intake/Output this shift:    HEENT exam unremarkable Chest clear without rales or rhonchi Cardiovascular regular S1-S2 no S3 appreciated Neuro nonfocal Extremities no edema, patient has decubiti on her ankle, bandage in place Sacral decubitus bandage in place, bandage not removed  Lab Results:  Basename 04/04/11 0318 04/03/11 0330  WBC 10.9* 10.6*  HGB 8.2* 8.0*  HCT 25.1* 24.2*  PLT 367 363   BMET  Basename 04/04/11 0318 04/03/11 0330  NA 137 141  K 4.2 4.1  CL 107 111  CO2 22 22  GLUCOSE 116* 125*  BUN 45* 53*  CREATININE 1.95* 1.97*  CALCIUM 8.4 8.4    Studies/Results: No results found.  Medications:  Scheduled:   . Chlorhexidine Gluconate Cloth  6 each Topical Q0600  . feeding supplement  237 mL Oral TID WC  . glimepiride  2 mg Oral Q breakfast  . insulin aspart  0-15 Units Subcutaneous TID WC  . insulin aspart  0-5 Units Subcutaneous QHS  . Juven  2 packet Oral BID  . mupirocin ointment  1 application Nasal BID  . sulfamethoxazole-trimethoprim  1 tablet Oral Q12H  . Tamsulosin HCl  0.4 mg Oral QHS   Continuous:  [id number], acetaminophen, albuterol,  HYDROcodone-acetaminophen, ondansetron (ZOFRAN) IV, ondansetron  Assessment/Plan: Principal Problem:  *Anemia Active Problems:  Diabetes mellitus  UTI (lower urinary tract infection)  Sacral decubitus ulcer  Ulcer of ankle  Acute renal failure (ARF)  Continue current treatment as outlined.    LOS: 5 days   Freman Lapage D 04/04/2011, 11:00 AM

## 2011-04-04 NOTE — Progress Notes (Signed)
Subjective: Patient reports no problems with his foley catheter or other GU complaints.  Objective: Vital signs in last 24 hours: Temp:  [98.2 F (36.8 C)-99.1 F (37.3 C)] 99.1 F (37.3 C) (12/01 0542) Pulse Rate:  [70-87] 87  (12/01 0542) Resp:  [16-18] 16  (12/01 0542) BP: (130-146)/(55-62) 136/62 mmHg (12/01 0542) SpO2:  [93 %-97 %] 93 % (12/01 0542)  Intake/Output from previous day: 11/30 0701 - 12/01 0700 In: 240 [P.O.:240] Out: 1950 [Urine:1950] Intake/Output this shift:    Physical Exam:  General:alert and no distress GI: not done Male genitalia: foley draining clear urine.   Lab Results:  Basename 04/04/11 0318 04/03/11 0330 04/02/11 0324  HGB 8.2* 8.0* 8.2*  HCT 25.1* 24.2* 24.9*   BMET  Basename 04/04/11 0318 04/03/11 0330  NA 137 141  K 4.2 4.1  CL 107 111  CO2 22 22  GLUCOSE 116* 125*  BUN 45* 53*  CREATININE 1.95* 1.97*  CALCIUM 8.4 8.4   No results found for this basename: LABPT:3,INR:3 in the last 72 hours No results found for this basename: LABURIN:1 in the last 72 hours Results for orders placed during the hospital encounter of 03/30/11  URINE CULTURE     Status: Normal   Collection Time   03/30/11  2:27 PM      Component Value Range Status Comment   Specimen Description URINE, CATHETERIZED   Final    Special Requests Normal   Final    Setup Time [id number]   Final    Colony Count >=100,000 COLONIES/ML   Final    Culture     Final    Value: METHICILLIN RESISTANT STAPHYLOCOCCUS AUREUS     Note: RIFAMPIN AND GENTAMICIN SHOULD NOT BE USED AS SINGLE DRUGS FOR TREATMENT OF STAPH INFECTIONS. CRITICAL RESULT CALLED TO, READ BACK BY AND VERIFIED WITH: KIM O @ [place] 04/01/11   Report Status 04/01/2011 FINAL   Final    Organism ID, Bacteria METHICILLIN RESISTANT STAPHYLOCOCCUS AUREUS   Final   CULTURE, BLOOD (ROUTINE X 2)     Status: Normal (Preliminary result)   Collection Time   03/30/11  9:30 PM      Component Value Range Status  Comment   Specimen Description BLOOD RIGHT ANTECUBITAL   Final    Special Requests BOTTLES DRAWN AEROBIC AND ANAEROBIC 5ML   Final    Setup Time [id number]   Final    Culture     Final    Value:        BLOOD CULTURE RECEIVED NO GROWTH TO DATE CULTURE WILL BE HELD FOR 5 DAYS BEFORE ISSUING A FINAL NEGATIVE REPORT   Report Status PENDING   Incomplete   CULTURE, BLOOD (ROUTINE X 2)     Status: Normal (Preliminary result)   Collection Time   03/30/11  9:35 PM      Component Value Range Status Comment   Specimen Description BLOOD R HAND   Final    Special Requests BOTTLES DRAWN AEROBIC AND ANAEROBIC 2ML   Final    Setup Time [id number]   Final    Culture     Final    Value:        BLOOD CULTURE RECEIVED NO GROWTH TO DATE CULTURE WILL BE HELD FOR 5 DAYS BEFORE ISSUING A FINAL NEGATIVE REPORT   Report Status PENDING   Incomplete   MRSA PCR SCREENING     Status: Abnormal   Collection Time   03/31/11 10:36 AM  Component Value Range Status Comment   MRSA by PCR POSITIVE (*) NEGATIVE  Final     Studies/Results: No results found.  Assessment/Plan: He is doing well with the foley.  F/U with Dr. Jasmine December as planned.   LOS: 5 days   Lance [name] 04/04/2011, 10:31 AM

## 2011-04-05 LAB — GLUCOSE, CAPILLARY
Glucose-Capillary: 140 mg/dL — ABNORMAL HIGH (ref 70–99)
Glucose-Capillary: 230 mg/dL — ABNORMAL HIGH (ref 70–99)
Glucose-Capillary: 180 mg/dL — ABNORMAL HIGH (ref 70–99)

## 2011-04-05 NOTE — Progress Notes (Signed)
Subjective: Patient without any problems last night, without fever chills, no nausea no vomiting no abdominal pain. Isn't very good spirits, Lipitor to going to rehabilitation.  Objective: Vital signs in last 24 hours: Temp:  [98 F (36.7 C)-99.5 F (37.5 C)] 98 F (36.7 C) (12/02 1050) Pulse Rate:  [76-88] 76  (12/02 1050) Resp:  [16-20] 16  (12/02 1050) BP: (125-134)/(63-66) 130/64 mmHg (12/02 1050) SpO2:  [93 %-98 %] 98 % (12/02 1050) Weight change:  Last BM Date: 04/03/11  Intake/Output from previous day: 12/01 0701 - 12/02 0700 In: 380 [P.O.:380] Out: 1250 [Urine:1250] Intake/Output this shift: Total I/O In: 360 [P.O.:360] Out: -   HEENT exam unremarkable Chest clear without rales or rhonchi Cardiovascular regular S1-S2 no S3 appreciated Neuro nonfocal Extremities no edema, patient has decubiti on his ankle, bandage in place Sacral decubitus bandage in place, bandage not removed  Lab Results:  Basename 04/04/11 0318 04/03/11 0330  WBC 10.9* 10.6*  HGB 8.2* 8.0*  HCT 25.1* 24.2*  PLT 367 363   BMET  Basename 04/04/11 0318 04/03/11 0330  NA 137 141  K 4.2 4.1  CL 107 111  CO2 22 22  GLUCOSE 116* 125*  BUN 45* 53*  CREATININE 1.95* 1.97*  CALCIUM 8.4 8.4    Studies/Results: No results found.  Medications:  Scheduled:    . Chlorhexidine Gluconate Cloth  6 each Topical Q0600  . feeding supplement  237 mL Oral TID WC  . glimepiride  2 mg Oral Q breakfast  . insulin aspart  0-15 Units Subcutaneous TID WC  . insulin aspart  0-5 Units Subcutaneous QHS  . Juven  2 packet Oral BID  . mupirocin ointment  1 application Nasal BID  . sulfamethoxazole-trimethoprim  1 tablet Oral Q12H  . Tamsulosin HCl  0.4 mg Oral QHS   Continuous:  [id number], acetaminophen, albuterol, HYDROcodone-acetaminophen, ondansetron (ZOFRAN) IV, ondansetron  Assessment/Plan: Principal Problem:  *Anemia Active Problems:  Diabetes mellitus  UTI (lower urinary tract  infection)  Sacral decubitus ulcer  Ulcer of ankle  Acute renal failure (ARF)  Continue current treatment as outlined.    LOS: 6 days   Dejane Scheibe D 04/05/2011, 1:37 PM

## 2011-04-06 ENCOUNTER — Encounter (HOSPITAL_COMMUNITY): Payer: Self-pay | Admitting: Rehabilitation

## 2011-04-06 ENCOUNTER — Inpatient Hospital Stay (HOSPITAL_COMMUNITY)
Admission: RE | Admit: 2011-04-06 | Discharge: 2011-04-24 | DRG: 945 | Disposition: A | Discharge status: Home Health | Payer: Medicare Other | Source: Ambulatory Visit | Attending: Physical Medicine & Rehabilitation | Admitting: Physical Medicine & Rehabilitation

## 2011-04-06 DIAGNOSIS — E119 Type 2 diabetes mellitus without complications: Secondary | ICD-10-CM | POA: Diagnosis present

## 2011-04-06 DIAGNOSIS — N390 Urinary tract infection, site not specified: Secondary | ICD-10-CM | POA: Diagnosis present

## 2011-04-06 DIAGNOSIS — L97309 Non-pressure chronic ulcer of unspecified ankle with unspecified severity: Secondary | ICD-10-CM | POA: Diagnosis present

## 2011-04-06 DIAGNOSIS — Z5189 Encounter for other specified aftercare: Secondary | ICD-10-CM

## 2011-04-06 DIAGNOSIS — D62 Acute posthemorrhagic anemia: Secondary | ICD-10-CM

## 2011-04-06 DIAGNOSIS — I10 Essential (primary) hypertension: Secondary | ICD-10-CM | POA: Diagnosis present

## 2011-04-06 DIAGNOSIS — Z87891 Personal history of nicotine dependence: Secondary | ICD-10-CM

## 2011-04-06 DIAGNOSIS — R5381 Other malaise: Secondary | ICD-10-CM | POA: Diagnosis present

## 2011-04-06 DIAGNOSIS — L89109 Pressure ulcer of unspecified part of back, unspecified stage: Secondary | ICD-10-CM | POA: Diagnosis present

## 2011-04-06 DIAGNOSIS — K661 Hemoperitoneum: Secondary | ICD-10-CM | POA: Diagnosis present

## 2011-04-06 DIAGNOSIS — G35 Multiple sclerosis: Secondary | ICD-10-CM

## 2011-04-06 DIAGNOSIS — N319 Neuromuscular dysfunction of bladder, unspecified: Secondary | ICD-10-CM | POA: Diagnosis present

## 2011-04-06 DIAGNOSIS — L8990 Pressure ulcer of unspecified site, unspecified stage: Secondary | ICD-10-CM | POA: Diagnosis present

## 2011-04-06 DIAGNOSIS — N170 Acute kidney failure with tubular necrosis: Secondary | ICD-10-CM | POA: Diagnosis present

## 2011-04-06 DIAGNOSIS — K592 Neurogenic bowel, not elsewhere classified: Secondary | ICD-10-CM | POA: Diagnosis present

## 2011-04-06 LAB — CULTURE, BLOOD (ROUTINE X 2)
Culture  Setup Time: 201211270120
Culture: NO GROWTH

## 2011-04-06 MED ORDER — ONDANSETRON HCL 4 MG PO TABS: 4.0000 mg | ORAL_TABLET | Freq: Four times a day (QID) | ORAL | Status: AC | PRN

## 2011-04-06 MED ORDER — BISACODYL 10 MG RE SUPP
10.0000 mg | RECTAL | Status: DC
Start: 2011-04-06 — End: 2011-04-06

## 2011-04-06 MED ORDER — METHOCARBAMOL 500 MG PO TABS: 500.0000 mg | ORAL_TABLET | Freq: Four times a day (QID) | ORAL | Status: DC | PRN

## 2011-04-06 MED ORDER — CHLORHEXIDINE GLUCONATE CLOTH 2 % EX PADS: 6.0000 | MEDICATED_PAD | Freq: Every day | CUTANEOUS | Status: DC

## 2011-04-06 MED ORDER — INSULIN ASPART 100 UNIT/ML ~~LOC~~ SOLN
0.0000 [IU] | Freq: Three times a day (TID) | SUBCUTANEOUS | Status: DC
  Administered 2011-04-07 (×2): 2 [IU] via SUBCUTANEOUS
  Administered 2011-04-08 – 2011-04-09 (×3): 1 [IU] via SUBCUTANEOUS
  Administered 2011-04-09: 3 [IU] via SUBCUTANEOUS
  Administered 2011-04-10 (×2): 2 [IU] via SUBCUTANEOUS
  Administered 2011-04-11 (×2): 1 [IU] via SUBCUTANEOUS
  Administered 2011-04-12: 2 [IU] via SUBCUTANEOUS
  Administered 2011-04-12: 1 [IU] via SUBCUTANEOUS
  Administered 2011-04-13: 2 [IU] via SUBCUTANEOUS
  Administered 2011-04-14: 1 [IU] via SUBCUTANEOUS
  Administered 2011-04-14 – 2011-04-17 (×4): 2 [IU] via SUBCUTANEOUS
  Administered 2011-04-18: 1 [IU] via SUBCUTANEOUS
  Administered 2011-04-18 – 2011-04-19 (×2): 2 [IU] via SUBCUTANEOUS
  Administered 2011-04-19: 1 [IU] via SUBCUTANEOUS
  Administered 2011-04-20 – 2011-04-22 (×3): 2 [IU] via SUBCUTANEOUS
  Filled 2011-04-06: qty 3

## 2011-04-06 MED ORDER — INSULIN ASPART 100 UNIT/ML ~~LOC~~ SOLN
0.0000 [IU] | Freq: Every day | SUBCUTANEOUS | Status: DC
  Administered 2011-04-17: 0 [IU] via SUBCUTANEOUS
  Filled 2011-04-06: qty 3

## 2011-04-06 MED ORDER — PROMETHAZINE HCL 25 MG/ML IJ SOLN: 12.5000 mg | Freq: Four times a day (QID) | INTRAMUSCULAR | Status: DC | PRN

## 2011-04-06 MED ORDER — ALUM & MAG HYDROXIDE-SIMETH 400-400-40 MG/5ML PO SUSP
30.0000 mL | ORAL | Status: DC | PRN
  Filled 2011-04-06: qty 30

## 2011-04-06 MED ORDER — HYDROCODONE-ACETAMINOPHEN 5-325 MG PO TABS: 1.0000 | ORAL_TABLET | ORAL | Status: DC | PRN

## 2011-04-06 MED ORDER — ACETAMINOPHEN 325 MG PO TABS: 325.0000 mg | ORAL_TABLET | ORAL | Status: DC | PRN

## 2011-04-06 MED ORDER — GLIMEPIRIDE 4 MG PO TABS
4.0000 mg | ORAL_TABLET | Freq: Every day | ORAL | Status: DC
  Administered 2011-04-06: 4 mg via ORAL
  Filled 2011-04-06: qty 1

## 2011-04-06 MED ORDER — BISACODYL 10 MG RE SUPP
10.0000 mg | Freq: Every day | RECTAL | Status: DC | PRN
  Administered 2011-04-11 – 2011-04-16 (×2): 10 mg via RECTAL
  Filled 2011-04-06 (×2): qty 1

## 2011-04-06 MED ORDER — SULFAMETHOXAZOLE-TMP DS 800-160 MG PO TABS
1.0000 | ORAL_TABLET | Freq: Two times a day (BID) | ORAL | Status: AC
  Administered 2011-04-06 – 2011-04-13 (×15): 1 via ORAL
  Filled 2011-04-06 (×15): qty 1

## 2011-04-06 MED ORDER — TAMSULOSIN HCL 0.4 MG PO CAPS
0.4000 mg | ORAL_CAPSULE | ORAL | Status: DC
  Administered 2011-04-06 – 2011-04-23 (×17): 0.4 mg via ORAL
  Filled 2011-04-06 (×19): qty 1

## 2011-04-06 MED ORDER — BISACODYL 10 MG RE SUPP: 10.0000 mg | RECTAL | Status: AC

## 2011-04-06 MED ORDER — ENSURE CLINICAL ST REVIGOR PO LIQD: 237.0000 mL | Freq: Three times a day (TID) | ORAL | Status: DC

## 2011-04-06 MED ORDER — OXYBUTYNIN CHLORIDE 5 MG PO TABS
5.0000 mg | ORAL_TABLET | Freq: Every day | ORAL | Status: DC
  Administered 2011-04-07 – 2011-04-24 (×18): 5 mg via ORAL
  Filled 2011-04-06 (×20): qty 1

## 2011-04-06 MED ORDER — GLIMEPIRIDE 4 MG PO TABS
4.0000 mg | ORAL_TABLET | Freq: Every day | ORAL | Status: DC
  Administered 2011-04-07 – 2011-04-24 (×18): 4 mg via ORAL
  Filled 2011-04-06 (×20): qty 1

## 2011-04-06 MED ORDER — DIPHENHYDRAMINE HCL 12.5 MG/5ML PO ELIX: 12.5000 mg | ORAL_SOLUTION | Freq: Four times a day (QID) | ORAL | Status: DC | PRN

## 2011-04-06 MED ORDER — INSULIN ASPART 100 UNIT/ML ~~LOC~~ SOLN: 0.0000 [IU] | Freq: Three times a day (TID) | SUBCUTANEOUS | Status: DC

## 2011-04-06 MED ORDER — TRAZODONE HCL 50 MG PO TABS
25.0000 mg | ORAL_TABLET | Freq: Every evening | ORAL | Status: DC | PRN
  Filled 2011-04-06: qty 1

## 2011-04-06 MED ORDER — FLEET ENEMA 7-19 GM/118ML RE ENEM
1.0000 | ENEMA | Freq: Once | RECTAL | Status: AC | PRN
  Filled 2011-04-06: qty 1

## 2011-04-06 MED ORDER — PROMETHAZINE HCL 12.5 MG RE SUPP: 12.5000 mg | Freq: Four times a day (QID) | RECTAL | Status: DC | PRN

## 2011-04-06 MED ORDER — JUVEN PO PACK: 2.0000 | PACK | Freq: Two times a day (BID) | ORAL | Status: DC

## 2011-04-06 MED ORDER — ENOXAPARIN SODIUM 40 MG/0.4ML ~~LOC~~ SOLN
40.0000 mg | SUBCUTANEOUS | Status: DC
  Administered 2011-04-06 – 2011-04-23 (×18): 40 mg via SUBCUTANEOUS
  Filled 2011-04-06 (×20): qty 0.4

## 2011-04-06 MED ORDER — VITAMIN D3 25 MCG (1000 UNIT) PO TABS
2000.0000 [IU] | ORAL_TABLET | Freq: Every day | ORAL | Status: DC
  Administered 2011-04-07 – 2011-04-16 (×10): 2000 [IU] via ORAL
  Administered 2011-04-17: 1000 [IU] via ORAL
  Administered 2011-04-18 – 2011-04-24 (×7): 2000 [IU] via ORAL
  Filled 2011-04-06 (×20): qty 2

## 2011-04-06 MED ORDER — BISACODYL 10 MG RE SUPP: 10.0000 mg | RECTAL | Status: DC

## 2011-04-06 MED ORDER — SULFAMETHOXAZOLE-TMP DS 800-160 MG PO TABS: 1.0000 | ORAL_TABLET | Freq: Two times a day (BID) | ORAL | Status: AC

## 2011-04-06 MED ORDER — PIOGLITAZONE HCL 30 MG PO TABS
30.0000 mg | ORAL_TABLET | Freq: Every day | ORAL | Status: DC
  Administered 2011-04-06: 30 mg via ORAL
  Filled 2011-04-06: qty 1

## 2011-04-06 MED ORDER — GUAIFENESIN-DM 100-10 MG/5ML PO SYRP: 5.0000 mL | ORAL_SOLUTION | Freq: Four times a day (QID) | ORAL | Status: DC | PRN

## 2011-04-06 MED ORDER — OMEGA-3-ACID ETHYL ESTERS 1 G PO CAPS
1.0000 g | ORAL_CAPSULE | Freq: Two times a day (BID) | ORAL | Status: DC
  Administered 2011-04-06 – 2011-04-24 (×36): 1 g via ORAL
  Filled 2011-04-06 (×39): qty 1

## 2011-04-06 MED ORDER — PIOGLITAZONE HCL 30 MG PO TABS
30.0000 mg | ORAL_TABLET | Freq: Every day | ORAL | Status: DC
  Administered 2011-04-07 – 2011-04-24 (×18): 30 mg via ORAL
  Filled 2011-04-06 (×20): qty 1

## 2011-04-06 MED ORDER — INSULIN ASPART 100 UNIT/ML ~~LOC~~ SOLN: 0.0000 [IU] | Freq: Every day | SUBCUTANEOUS | Status: DC

## 2011-04-06 MED ORDER — ASPIRIN EC 81 MG PO TBEC
81.0000 mg | DELAYED_RELEASE_TABLET | Freq: Every day | ORAL | Status: DC
  Administered 2011-04-07 – 2011-04-24 (×18): 81 mg via ORAL
  Filled 2011-04-06 (×20): qty 1

## 2011-04-06 MED ORDER — PROMETHAZINE HCL 12.5 MG PO TABS: 12.5000 mg | ORAL_TABLET | Freq: Four times a day (QID) | ORAL | Status: DC | PRN

## 2011-04-06 NOTE — Progress Notes (Signed)
Physical Therapy Treatment Patient Details Name: Lance [name] MRN: [id number] [name]: 06-14-1940 Today's Date: 04/06/2011 10:35 - 11;10 2 ta PT Assessment/Plan  PT - Assessment/Plan Comments on Treatment Session: pt states he feels better and is eating better.  Plans to D/C to CIR for Rehab PT Plan: Discharge plan remains appropriate Follow Up Recommendations: Inpatient Rehab Equipment Recommended: Defer to next venue PT Goals  Acute Rehab PT Goals PT Goal Formulation: With patient Pt will Roll Supine to Right Side: with mod assist PT Goal: Rolling Supine to Right Side - Progress: Progressing toward goal Pt will Roll Supine to Left Side: with min assist PT Goal: Rolling Supine to Left Side - Progress: Progressing toward goal Pt will go Supine/Side to Sit: with +2 total assist (pt 50%) PT Goal: Supine/Side to Sit - Progress: Progressing toward goal Pt will Sit at [facility] of Bed: with supervision;6-10 min;with unilateral upper extremity support PT Goal: Sit at Edge Of Bed - Progress: Progressing toward goal Pt will Perform Home Exercise Program: with supervision, verbal cues required/provided PT Goal: Perform Home Exercise Program - Progress: Progressing toward goal  PT Treatment Precautions/Restrictions  HX MS B LE weakness Restrictions Weight Bearing Restrictions: Yes RLE Weight Bearing: Non weight bearing LLE Weight Bearing: Non weight bearing Mobility (including Balance) Bed Mobility Bed Mobility: Yes Rolling Right: 2: Max assist Rolling Right Details (indicate cue type and reason): use of bed rails....total assist to move B LE Rolling Left: 2: Max assist Rolling Left Details (indicate cue type and reason): total assist to move B LE Supine to Sit: 1: +2 Total assist Supine to Sit Details (indicate cue type and reason): Total assist + 2 pt 25% with increased strength/use B UE Transfers Transfers: Yes Squat Pivot Transfers: 1: +1 Total assist Squat Pivot Transfer Details  (indicate cue type and reason): Total assist + 1 pt  20% bed to chair Ambulation/Gait Ambulation/Gait: No Stairs: No Wheelchair Mobility Wheelchair Mobility: No  Static Sitting Balance Static Sitting - Balance Support: Bilateral upper extremity supported;Feet supported Static Sitting - Comment/# of Minutes: sitting EOB x 9 min at [place] while performing trunk rotastion and extension TE's while supporting self with B UE's Exercise  Other Exercises Other Exercises: PROM B LE hip flex/ext, hip abd/add, hip IR/ER, knee flex/ext and ankle flex/ext End of Session PT - End of Session Equipment Utilized During Treatment: Gait belt Activity Tolerance: Patient tolerated treatment well Patient left: in chair (with [employer] in chair) Nurse Communication: Need for Field seismologist Behavior During Session: [facility] for tasks performed Cognition: [facility] for tasks performed Will discuss with LPT to add transfer goal as pt is feeling better and wanting practice  Rica Koyanagi PTA [facility]  Acute  Rehab Pager     [phone]

## 2011-04-06 NOTE — Progress Notes (Addendum)
Patient arrived to 4000 at 1530 via [place] from [place] today. Patient in good spirits. Oriented to unit and call bell system. Foley catheter intact.

## 2011-04-06 NOTE — Progress Notes (Signed)
Overall Plan of Care [facility]) Patient Details Name: Lance [name] MRN: [id number] [name]: 1940/10/26  Diagnosis:    Primary Diagnosis:   psuedo exacerbation of MS <principal problem not specified> Co-morbidities: neurogenic bowel and bladder, spasticity, dm, wounds  Functional Problem List  Patient demonstrates impairments in the following areas: Bladder, Bowel, Cognition, Medication Management, Pain, Safety and Skin Integrity  Basic ADL's: grooming, bathing, dressing and toileting Advanced ADL's: none at this time  Transfers:  bed mobility, bed to chair, toilet, tub/shower, car and furniture Locomotion:  ambulation, wheelchair mobility and stairs  Additional Impairments:  Other  Anticipated Outcomes Item Anticipated Outcome  Eating/Swallowing    Basic self-care  supervision  Tolieting  Mod i..supervison for perineal hygiene (OT goal)  Bowel/Bladder  Mod i  Transfers  supervision  Locomotion  Modified independent w/c mobility  Communication    Cognition  Mod i  Pain  Pain <3  Safety/Judgment  Supervision  Other     Therapy Plan:  1-2 x day PT       Team Interventions: Item RN PT OT SLP SW TR Other  Self Care/Advanced ADL Retraining   x      Neuromuscular Re-Education  x x      Therapeutic Activities  x x   x   UE/LE Strength Training/ROM  x x   x   UE/LE Coordination Activities  x x   x   Visual/Perceptual Remediation/Compensation         DME/Adaptive Equipment Instruction  x x   x   Therapeutic Exercise  x x   x   Balance/Vestibular Training  x x   x   Patient/Family Education  x x   x   Cognitive Remediation/Compensation         Functional Mobility Training  x x   x   Ambulation/Gait Training  x       Stair Training  x       Wheelchair Propulsion/Positioning  x x   x   Functional IT sales professional Reintegration  x x   x   Dysphagia/Aspiration Environmental consultant         Bladder  Management x        Bowel Management x        Disease Management/Prevention x        Pain Management x x x      Medication Management x        Skin Care/Wound Management x x x      Splinting/Orthotics  x x      Discharge Planning  x x  x x   Psychosocial Support     x x                      Team Discharge Planning: Destination:  Home Projected Follow-up:  PT, OT and Home Health Projected Equipment Needs:  Bedside Commode, Sliding Board and Tub Bench Patient/family involved in discharge planning:  Yes  MD ELOS: 2-3 weeks Medical Rehab Prognosis:  Excellent Assessment: Patient is admitted for inpatient rehabilitation. Physical therapy will be working with the patient at least one to 2 hours a day at least 5 days a week for functional mobility adaptive equipment transfers pain and spasticity management with goals supervision to modified independent. Occupational therapy will be working with the patient at least one to 2 hours a  day at least 5 days a week for self-care functional mobility spasticity management adaptive equipment training use of adaptive wheelchair or scooter with goals overall supervision to modified independent. Patient is quite motivated and should do extremely well. When to learn some new strategies to improve his functionality. Nursing M.D. will be trying to optimize skin care needs and diabetic issues as well as spasticity control.

## 2011-04-06 NOTE — Progress Notes (Signed)
Have bed availability today at Inpatient Rehab. Discussed plan of care with patient and his wife. Pt worked with therapy today and making some progress. Would like to bring to CIR today. Bedside RN and case manager aware. Caprice Kluver

## 2011-04-06 NOTE — Progress Notes (Signed)
Subjective: No new complaints  Objective: Vital signs in last 24 hours: Temp:  [97.9 F (36.6 C)-99.5 F (37.5 C)] 99.5 F (37.5 C) (12/03 0530) Pulse Rate:  [75-85] 85  (12/03 0530) Resp:  [16-20] 20  (12/03 0530) BP: (121-133)/(62-64) 121/62 mmHg (12/03 0530) SpO2:  [92 %-98 %] 92 % (12/03 0530) Weight change:  Last BM Date: 04/03/11  Intake/Output from previous day: 12/02 0701 - 12/03 0700 In: 600 [P.O.:600] Out: 1650 [Urine:1650] Intake/Output this shift:    Resp: clear to auscultation bilaterally Cardio: regular rate and rhythm, S1, S2 normal, no murmur, click, rub or gallop  Lab Results:  [facility] 04/04/11 0318  WBC 10.9*  HGB 8.2*  HCT 25.1*  PLT 367   BMET  Basename 04/04/11 0318  NA 137  K 4.2  CL 107  CO2 22  GLUCOSE 116*  BUN 45*  CREATININE 1.95*  CALCIUM 8.4    Studies/Results: No results found.  Medications: I have reviewed the patient's current medications.  Assessment/Plan: Principal Problem:  *Anemia stable Active Problems:  Acute renal failure (ARF)  Renal function stable.  BP ok, hold on restart ACEI for now  Diabetes mellitus,  Not optimally controlled      Increase amaryl, restart actos  UTI (lower urinary tract infection)      Continue sulfa, total 2 weeks antibiotics  Sacral decubitus ulcer  Local care  Ulcer of ankle local care, prevalon boot Constipation, no BM in 3 days      Dulcolax suppository qod   LOS: 7 days   Lance [name] 04/06/2011, 7:12 AM

## 2011-04-06 NOTE — Progress Notes (Signed)
CM received referral.  See [facility] note in shadow chart.

## 2011-04-06 NOTE — H&P (Signed)
Physical Medicine and Rehabilitation Admission H Chief Complaint:  Weakness and deconditioning due to retroperitoneal and perinephric hematoma, MS  HPI: Lance [name] is an 70 y.o. malewith neurogenic bladder due to multiple sclerosis  who was admitted for lithotripsy 11/19 and D/C next day Readmitted 11/26 with weakness, inability to perform his electric wheel chair transfers and noted to be febrile in ED. Patient with hgb of 6.2. CT abdomen with right perinephric hematoma and right retroperitoneal hematoma. Started on antibiotics for UTI and treated with 2units PRBCs. Acute renal failure ( ATN due to hypotension and meds ) treated with IVF. Sacral decub monitored by WOC. Had been on bedrest till friday.  Therapies initiated and patient noted to be deconditioned.  Working on balance at edge of bed.  Continues with foley with recommendations for 10 days antibiotic therapy.  Review of Systems  Eyes: Negative.   Respiratory: Negative.   Cardiovascular: Negative.   Genitourinary:       Neurogenic bladder.  Self caths 3 times a day.  Skin:       Large abraded area on right buttock.    Neurological: Positive for sensory change (Minimal sensation BLE) and focal weakness (paraparesis right greater than left.).  Psychiatric/Behavioral: Negative.    Past Medical History  Diagnosis Date  . Hypertension   . Blood transfusion   . Neuralgia neuritis, sciatic nerve     MS for 30 years  . Diabetes mellitus   . PONV (postoperative nausea and vomiting)    Past Surgical History  Procedure Date  . Tonsillectomy   . Finger surgery 2004   No family history on file.  Social History: Married.  Independent at scooter level.  He reports that he has quit smoking. His smoking use included Cigars. He has never used smokeless tobacco. He reports that he drinks about a glass of wine per night.  He reports that he does not use illicit drugs.   Wife at home works.  Son at home assists as needed.  Allergies:  No Known Allergies  Prior to Admission medications   Medication Sig Start Date End Date Taking? Authorizing Provider  aspirin 81 MG tablet Take 1 tablet (81 mg total) by mouth daily. 03/30/11  Yes Molli Hazard, MD  bisacodyl (DULCOLAX) 10 MG suppository Place 1 suppository (10 mg total) rectally every other day. 04/06/11 04/16/11 Yes [name], MD  Chlorhexidine Gluconate Cloth 2 % PADS Apply 6 each topically daily at 6 (six) AM. 04/06/11  Yes [name], MD  Cholecalciferol (VITAMIN D3) 2000 UNITS TABS Take 2,000 mg by mouth daily.     Yes Historical Provider, MD  feeding supplement (ENSURE CLINICAL STRENGTH) LIQD Take 237 mLs by mouth 3 (three) times daily with meals. 04/06/11  Yes [name], MD  fish oil-omega-3 fatty acids 1000 MG capsule Take 1 capsule (1 g total) by mouth daily. 03/30/11  Yes Molli Hazard, MD  glimepiride (AMARYL) 4 MG tablet Take 4 mg by mouth every morning.     Yes Historical Provider, MD  HYDROcodone-acetaminophen (NORCO) 5-325 MG per tablet Take 1-2 tablets by mouth every 4 (four) hours as needed. For pain.  04/06/11 04/16/11 Yes [name], MD  insulin aspart (NOVOLOG) 100 UNIT/ML injection Inject 0-15 Units into the skin 3 (three) times daily with meals. 04/06/11 04/05/12 Yes [name], MD  insulin aspart (NOVOLOG) 100 UNIT/ML injection Inject 0-5 Units into the skin at bedtime. 04/06/11 04/05/12 Yes [name], MD  Nutritional Supplements (JUVEN) PACK Take 2 packets by mouth 2 (two) times daily. 04/06/11  Yes [name], MD  ondansetron (ZOFRAN) 4 MG tablet Take 1 tablet (4 mg total) by mouth every 6 (six) hours as needed for nausea. 04/06/11 04/13/11 Yes [name], MD  oxybutynin (DITROPAN) 5 MG tablet Take 5 mg by mouth 2 (two) times daily.     Yes Historical Provider, MD  pioglitazone (ACTOS) 30 MG tablet Take 30 mg by mouth daily.     Yes Historical Provider, MD    sulfamethoxazole-trimethoprim (BACTRIM DS) 800-160 MG per tablet Take 1 tablet by mouth every 12 (twelve) hours. 04/06/11 04/09/11 Yes [name], MD  Tamsulosin HCl (FLOMAX) 0.4 MG CAPS Take 1 capsule (0.4 mg total) by mouth at bedtime. 03/23/11  Yes Molli Hazard, MD         Home:  One level home. No steps at entry.   Functional History:  Use scooter for mobility.  Able to stand and take 1-2 shuffling steps.  Independent for ADLs .  Drives with hand controls.  (Son provides supervision)                    There were no vitals taken for this visit. Physical Exam  Constitutional: He is oriented to person, place, and time. He appears well-developed and well-nourished.  HENT:  Head: Normocephalic.  Neck: Normal range of motion.  Cardiovascular: Normal rate.   Pulmonary/Chest: Effort normal and breath sounds normal.  Abdominal: Soft. Bowel sounds are normal.  Musculoskeletal: He exhibits edema (Min edema BLE).  Neurological: He is alert and oriented to person, place, and time. A sensory deficit is present. He exhibits abnormal muscle tone. Coordination abnormal.       BLE with hyperreflexia.  Sustained clonus (B) with light touch.  Bilateral foot drop.  Skin: Skin is warm and dry.       Post Admission Physician Evaluation: 1. Functional deficits secondary  to deconditioning after acute blood loss and anemia with multiple sclerosis pseudo-exacerbation. 2. Patient admitted to receive collaborative, interdisciplinary care between the physiatrist, rehab nursing staff, and therapy team. 3. Patient's level of medical complexity and substantial therapy needs in context of that medical necessity cannot be provided at a lesser intensity of care. Patient has experienced substantial functional loss from his/her baseline. Upon functional assessment at the time of the preadmission screening, patient was Mobility:  Bed Mobility  Rolling Right: 2: Max assist  Rolling  Right Details (indicate cue type and reason): needs assist for leg placement and cues to look with head, reach with arm for rail and max assist at hip to roll  Rolling Left: 3: Mod assist  Rolling Left Details (indicate cue type and reason): needs assist to stabalize leg and mod assist to roll hip over  Supine to Sit: 1: +2 Total assist (pt=0%)  Supine to Sit Details (indicate cue type and reason): pt with anxiety about falling, need total assist to move legs and lift upper body  Sit to Supine - Right: 1: +2 Total assist  .  Upon most recent functional evaluation, patient was Current Functional Levels:  Bed Mobility  Bed Mobility: Yes  Rolling Right: 2: Max assist  Rolling Right Details (indicate cue type and reason): use of bed rails....total assist to move B LE  Rolling Left: 2: Max assist  Rolling Left Details (indicate cue type and reason): total assist to move B LE  Supine to Sit: 1: +2  Total assist  Supine to Sit Details (indicate cue type and reason): Total assist + 2 pt 25% with increased strength/use B UE  Sit to Supine - Right: 1: +2 Total assist  Transfers  Transfers: Yes  Squat Pivot Transfers: 1: +1 Total assist  Squat Pivot Transfer Details (indicate cue type and reason): Total assist + 1 pt 20% bed to chair  Ambulation/Gait  Ambulation/Gait: No  Stairs: No  Wheelchair Mobility  Wheelchair Mobility: No  Posture/Postural Control  Posture/Postural Control: No significant limitations (core stability weakness, but able to activate)  Static Sitting Balance  Static Sitting - Balance Support: Bilateral upper extremity supported;Feet supported  Static Sitting - Level of Assistance: 3: Mod assist  Static Sitting - Comment/# of Minutes: sitting EOB x 9 min at [employer] while performing trunk rotastion and extension TE's while supporting self with B UE's    4. .  Judging by the patient's diagnosis, physical exam, and functional history, the patient has potential for  functional progress which will result in measurable gains while on inpatient rehab.  These gains will be of substantial and practical use upon discharge in facilitating mobility and self-care at the household level. 5. Physiatrist will provide 24 hour management of medical needs as well as oversight of the therapy plan/treatment and provide guidance as appropriate regarding the interaction of the two. 6. 24 hour rehab nursing will assist in the management of  bladder management, bowel management, safety, skin/wound care, disease management, medication administration, pain management and patient education  and help integrate therapy concepts, techniques,education, etc. 7. PT will assess and treat for: balance, endurance, locomotion, strength and transferring. Goals are: independent with assistive device. 8. OT will assess and treat for: bathing, dressing, strength, toileting and transferring .  Goals are: independent with assistive device.  9. SLP will assess and treat for: Not applicable.  Goals are: N/A. 10. Case Management and Social Worker will assess and treat for psychological issues and discharge planning. 11. Team conference will be held weekly to assess progress toward goals and to determine barriers to discharge. 12.  Patient will receive at least 3 hours of therapy per day at least 5 days per week. 13. ELOS and Prognosis: 10days good   Medical Problem List and Plan: 1. DVT Prophylaxis/Anticoagulation: Pharmaceutical: Lovenox.  Patients hgb stable and will initiate lovenox 2. Pain Management:  Continue prn vicodin. 3. Mood: Monitor for now.  Patient Active Hospital Problem List: 4. UTI:  Continue septra DS for 10 total day 5. ABLA:  Continue to monitor with serial H and H.  Add iron supplement. 6. Sacral decub and bilateral heel blisters:  Continue air mattress overlay. Order additional prevalon boot and use both when in bed.  Allevyn drsg to be changed 3-4 days.  Nutritional supplements  for wound healing. 7.  MS with spasticity:  No meds currently. May need to and baclofen in physical therapy not sufficient to reduce spasticity 8.  Diabetes Mellitus:  Continue Amaryl and actos.  Monitor BS with ac/hs cbg checks and use SSI for elevated BS.    Lance [name] 04/06/2011, 4:54 PM

## 2011-04-06 NOTE — Progress Notes (Signed)
Urology Progress Note  Subjective:     No acute urologic events overnight.   Objective:  Patient Vitals for the past 24 hrs:  BP Temp Temp src Pulse Resp SpO2  04/06/11 0530 121/62 mmHg 99.5 F (37.5 C) Oral 85  20  92 %  04/05/11 2219 130/64 mmHg 98.9 F (37.2 C) Oral 81  20  96 %  04/05/11 1400 133/62 mmHg 97.9 F (36.6 C) Oral 75  18  95 %  04/05/11 1050 130/64 mmHg 98 F (36.7 C) Oral 76  16  98 %    Physical Exam: General:  No acute distress, awake Chest:  CTA-B Abdomen:               []  Soft, appropriately TTP  [x]  Soft, NTTP  []  Soft, appropriately TTP, incision(s) clean/dry/intact  Genitourinary: Ecchymosis at superior scrotum improved Foley:  14 Fr foley in place draining clear yellow urine    I/O last 3 completed shifts: In: 980 [P.O.:980] Out: 2350 [Urine:2350]  Recent Labs  [facility] 04/04/11 0318   HGB 8.2*   WBC 10.9*   PLT 367    Recent Labs  Basename 04/04/11 0318   NA 137   K 4.2   CL 107   CO2 22   BUN 45*   CREATININE 1.95*   CALCIUM 8.4   GFRNONAA 33*   GFRAA 39*     No results found for this basename: PT:2,INR:2,APTT:2 in the last 72 hours   No components found with this basename: ABG:2     Assessment: Acute anemia.  Right perinephric hematoma.  Acute renal failure. UTI. Plan: -Renal function greatly improved. -Anemia stable. -Would continue bactrim for a total of 10 days of antibiotic treatment (counting the days he received IV antibiotics). -Follow up with me 05/07/11 at 1:00 pm.   Rolan Bucco, MD [phone]

## 2011-04-06 NOTE — PMR Pre-admission (Signed)
PMR Admission Coordinator Pre-Admission Assessment  Patient:  Lance [name] is an 70 y.o., male MRN:  [id number] [name]:  1941-02-20  Insurance Information:  PRIMARY:Medicare     [id number]      Subscriber:patient CM Name:      Phone#:     Fax#: Pre-Cert#:      Employer:retired Benefits:  Phone #:[employer]      Eff. Date:A 07/02/93; B 10/02/96     Deduct:$1156      Out of Pocket Max:0      Life [id number] CIR:100%      SNF:LBD=02/18/2004; 100 days Outpatient:80%     Co-Pay:20% Home Health:100%      Co-Pay:0 DME:80%     Co-Pay:20% Providers:patient's choice SECONDARY:[employer]      Policy#:[id number]      Subscriber:patient    Current Medical History:   Patient Admitting Diagnosis:  Deconditioned after lithotripsy and perinephric hematoma; MS History of Present Illness: Patient had lithotripsy on 03/23/11 and readmitted for weakness and inability to perform transfers to his electric wheelchair. Pt had CT done which showed perinephric hematoma. Hgb=6.2. Pt transfused and working with therapy. No changes since consult done on 04/03/11. Patients Past Medical History:   Past Medical History  Diagnosis Date  . Hypertension   . Blood transfusion   . Neuralgia neuritis, sciatic nerve     MS for 30 years  . Diabetes mellitus   . PONV (postoperative nausea and vomiting)    Family Medical History:  family history is not on file.   Height and Weight Height: 6\' 4"  (193 cm) Weight: 92.987 kg (205 lb) BSA (Calculated - sq m): 2.23 sq meters BMI (Calculated): 25  Weight in (lb) to have BMI = 25: 205      Prior Rehab/Hospitalizations: none recently  Medications PTA Medications:   Medications Prior to Admission  Medication Dose Route Frequency Provider Last Rate Last Dose  . acetaminophen (TYLENOL) tablet 650 mg  650 mg Oral Q6H PRN Nayana Abrol       Or  . acetaminophen (TYLENOL) suppository 650 mg  650 mg Rectal Q6H PRN Nayana Abrol      . albuterol (PROVENTIL) (5  MG/ML) 0.5% nebulizer solution 2.5 mg  2.5 mg Nebulization Q2H PRN Nayana Abrol      . bisacodyl (DULCOLAX) suppository 10 mg  10 mg Rectal QODAY Kara Mead, PHARMD      . Chlorhexidine Gluconate Cloth 2 % PADS 6 each  6 each Topical Q0600 [name], MD   6 each at 04/05/11 1016  . feeding supplement (ENSURE CLINICAL STRENGTH) liquid 237 mL  237 mL Oral TID WC Glory Rosebush, RD   237 mL at 04/06/11 1222  . glimepiride (AMARYL) tablet 4 mg  4 mg Oral Q breakfast [name], MD   4 mg at 04/06/11 0816  . HYDROcodone-acetaminophen (NORCO) 5-325 MG per tablet 1-2 tablet  1-2 tablet Oral Q4H PRN Nayana Abrol      . insulin aspart (novoLOG) injection 0-15 Units  0-15 Units Subcutaneous TID WC [name], MD   5 Units at 04/06/11 1222  . insulin aspart (novoLOG) injection 0-5 Units  0-5 Units Subcutaneous QHS [name], MD   2 Units at 04/04/11 2221  . Juven packet 2 packet  2 packet Oral BID Glory Rosebush, RD   2 packet at 04/05/11 1756  . mupirocin ointment (BACTROBAN) 2 % 1 application  1 application Nasal BID [name], MD  1 application at [id number]  . ondansetron (ZOFRAN) tablet 4 mg  4 mg Oral Q6H PRN Nayana Abrol       Or  . ondansetron (ZOFRAN) injection 4 mg  4 mg Intravenous Q6H PRN Nayana Abrol      . pioglitazone (ACTOS) tablet 30 mg  30 mg Oral Daily [name], MD   30 mg at 04/06/11 1014  . sodium chloride 0.9 % bolus 1,000 mL  1,000 mL Intravenous Once Otis Brace, PA   1,000 mL at 03/30/11 1741  . sulfamethoxazole-trimethoprim (BACTRIM DS) 800-160 MG per tablet 1 tablet  1 tablet Oral Q12H [name], MD   1 tablet at 04/06/11 1014  . Tamsulosin HCl (FLOMAX) capsule 0.4 mg  0.4 mg Oral QHS Nayana Abrol   0.4 mg at 04/05/11 2146  . vancomycin (VANCOCIN) IVPB 1000 mg/200 mL premix  1,000 mg Intravenous To ER [name], MD   1,000 mg at 03/30/11 2343  . DISCONTD: 0.9 %  sodium  chloride infusion   Intravenous Continuous Nayana Abrol 75 mL/hr at 04/03/11 0433    . DISCONTD: 0.9 %  sodium chloride infusion   Intravenous Continuous Nayana Abrol 75 mL/hr at 03/31/11 0300    . DISCONTD: aspirin tablet 81 mg  81 mg Oral Daily Nayana Abrol      . DISCONTD: bisacodyl (DULCOLAX) suppository 10 mg  10 mg Rectal QODAY [name], MD      . DISCONTD: ciprofloxacin (CIPRO) IVPB 400 mg  400 mg Intravenous Q12H Nayana Abrol      . DISCONTD: ciprofloxacin (CIPRO) tablet 500 mg  500 mg Oral BID Nayana Abrol   500 mg at 03/31/11 0121  . DISCONTD: fish oil-omega-3 fatty acids capsule 1 g  1 g Oral Daily Nayana Abrol      . DISCONTD: glimepiride (AMARYL) tablet 2 mg  2 mg Oral Q breakfast [name], MD   2 mg at 04/05/11 0818  . DISCONTD: glimepiride (AMARYL) tablet 4 mg  4 mg Oral Q0700 Nayana Abrol      . DISCONTD: HYDROcodone-acetaminophen (NORCO) 5-325 MG per tablet 1-2 tablet  1-2 tablet Oral Q6H PRN Nayana Abrol   2 tablet at 03/31/11 1501  . DISCONTD: methenamine (MANDELAMINE) tablet 0.5 g  0.5 g Oral Daily Nayana Abrol      . DISCONTD: omega-3 acid ethyl esters (LOVAZA) capsule 1 g  1 g Oral Daily Dorrene German, PHARMD      . DISCONTD: oxybutynin (DITROPAN) tablet 5 mg  5 mg Oral BID Nayana Abrol   5 mg at 03/31/11 0252  . DISCONTD: pantoprazole (PROTONIX) injection 40 mg  40 mg Intravenous Q12H Nayana Abrol   40 mg at 03/31/11 0356  . DISCONTD: pioglitazone (ACTOS) tablet 30 mg  30 mg Oral Daily Nayana Abrol      . DISCONTD: piperacillin-tazobactam (ZOSYN) IVPB 2.25 g  2.25 g Intravenous Q8H [name], MD   2.25 g at 03/31/11 0857  . DISCONTD: piperacillin-tazobactam (ZOSYN) IVPB 3.375 g  3.375 g Intravenous Q8H [name], MD   3.375 g at 03/30/11 2223  . DISCONTD: piperacillin-tazobactam (ZOSYN) IVPB 3.375 g  3.375 g Intravenous Q8H Lolita Patella, PHARMD   3.375 g at 04/01/11 1425  . DISCONTD: simvastatin (ZOCOR) tablet 20 mg  20  mg Oral QHS Nayana Abrol      . DISCONTD: vancomycin (VANCOCIN) 750 mg in sodium chloride 0.9 % 150 mL IVPB  750  mg Intravenous Q24H Lolita Patella, PHARMD   750 mg at 04/02/11 1534  . DISCONTD: vancomycin (VANCOCIN) IVPB 1000 mg/200 mL premix  1,000 mg Intravenous Q48H [name], MD       Medications Prior to Admission  Medication Sig Dispense Refill  . aspirin 81 MG tablet Take 1 tablet (81 mg total) by mouth daily.  1 tablet  0  . ciprofloxacin (CIPRO) 500 MG tablet Take 1 tablet (500 mg total) by mouth 2 (two) times daily.  6 tablet  0  . fish oil-omega-3 fatty acids 1000 MG capsule Take 1 capsule (1 g total) by mouth daily.  1 capsule  0  . glimepiride (AMARYL) 4 MG tablet Take 4 mg by mouth every morning.        Marland Kitchen HYDROcodone-acetaminophen (NORCO) 5-325 MG per tablet Take 1-2 tablets by mouth every 4 (four) hours as needed for pain.  40 tablet  0  . oxybutynin (DITROPAN) 5 MG tablet Take 5 mg by mouth 2 (two) times daily.        . pioglitazone (ACTOS) 30 MG tablet Take 30 mg by mouth daily.        . Tamsulosin HCl (FLOMAX) 0.4 MG CAPS Take 1 capsule (0.4 mg total) by mouth at bedtime.  30 capsule  3  . bisacodyl (DULCOLAX) 10 MG suppository Place 1 suppository (10 mg total) rectally every other day.  12 suppository  0  . Chlorhexidine Gluconate Cloth 2 % PADS Apply 6 each topically daily at 6 (six) AM.  30 each  0  . Cholecalciferol (VITAMIN D3) 2000 UNITS TABS Take 2,000 mg by mouth daily.        . feeding supplement (ENSURE CLINICAL STRENGTH) LIQD Take 237 mLs by mouth 3 (three) times daily with meals.  1 Bottle  0  . HYDROcodone-acetaminophen (NORCO) 5-325 MG per tablet Take 1-2 tablets by mouth every 4 (four) hours as needed.  30 tablet  0  . insulin aspart (NOVOLOG) 100 UNIT/ML injection Inject 0-15 Units into the skin 3 (three) times daily with meals.  1 vial  0  . insulin aspart (NOVOLOG) 100 UNIT/ML injection Inject 0-5 Units into the skin at bedtime.  1 vial  0    . Nutritional Supplements (JUVEN) PACK Take 2 packets by mouth 2 (two) times daily.  30 each  0  . ondansetron (ZOFRAN) 4 MG tablet Take 1 tablet (4 mg total) by mouth every 6 (six) hours as needed for nausea.  20 tablet  0  . sulfamethoxazole-trimethoprim (BACTRIM DS) 800-160 MG per tablet Take 1 tablet by mouth every 12 (twelve) hours.  6 tablet  0   Current Medications: Current facility-administered medications:acetaminophen (TYLENOL) suppository 650 mg, 650 mg, Rectal, Q6H PRN, Nayana Abrol;  acetaminophen (TYLENOL) tablet 650 mg, 650 mg, Oral, Q6H PRN, Nayana Abrol;  albuterol (PROVENTIL) (5 MG/ML) 0.5% nebulizer solution 2.5 mg, 2.5 mg, Nebulization, Q2H PRN, Nayana Abrol;  bisacodyl (DULCOLAX) suppository 10 mg, 10 mg, Rectal, QODAY, Kara Mead, PHARMD Chlorhexidine Gluconate Cloth 2 % PADS 6 each, 6 each, Topical, Q0600, [name], MD, 6 each at 04/05/11 1016;  feeding supplement (ENSURE CLINICAL STRENGTH) liquid 237 mL, 237 mL, Oral, TID WC, Glory Rosebush, RD, 237 mL at 04/06/11 1222;  glimepiride (AMARYL) tablet 4 mg, 4 mg, Oral, Q breakfast, [name], MD, 4 mg at 04/06/11 0816 HYDROcodone-acetaminophen (NORCO) 5-325 MG per tablet 1-2 tablet, 1-2 tablet, Oral, Q4H PRN, Nayana Abrol;  insulin aspart (  novoLOG) injection 0-15 Units, 0-15 Units, Subcutaneous, TID WC, [name], MD, 5 Units at 04/06/11 1222;  insulin aspart (novoLOG) injection 0-5 Units, 0-5 Units, Subcutaneous, QHS, [name], MD, 2 Units at 04/04/11 2221 Juven packet 2 packet, 2 packet, Oral, BID, Glory Rosebush, RD, 2 packet at 04/05/11 1756;  mupirocin ointment (BACTROBAN) 2 % 1 application, 1 application, Nasal, BID, [name], MD, 1 application at [id number];  ondansetron (ZOFRAN) injection 4 mg, 4 mg, Intravenous, Q6H PRN, Nayana Abrol;  ondansetron (ZOFRAN) tablet 4 mg, 4 mg, Oral, Q6H PRN, Nayana Abrol pioglitazone (ACTOS) tablet 30 mg, 30 mg,  Oral, Daily, [name], MD, 30 mg at 04/06/11 1014;  sulfamethoxazole-trimethoprim (BACTRIM DS) 800-160 MG per tablet 1 tablet, 1 tablet, Oral, Q12H, [name], MD, 1 tablet at 04/06/11 1014;  Tamsulosin HCl (FLOMAX) capsule 0.4 mg, 0.4 mg, Oral, QHS, Nayana Abrol, 0.4 mg at 04/05/11 2146;  DISCONTD: bisacodyl (DULCOLAX) suppository 10 mg, 10 mg, Rectal, QODAY, [name], MD DISCONTD: glimepiride (AMARYL) tablet 2 mg, 2 mg, Oral, Q breakfast, [name], MD, 2 mg at 04/05/11 0818  Precautions/Special Needs:  Precautions/Special Needs Precautions/Special Needs: Other Other Precautions: falls Conditions/Impairments that will impact rehabilitation: Balance;Other (loss of sensation below waist, not complete) Additional Precautions/Restrictions: Restrictions Weight Bearing Restrictions: Yes RLE Weight Bearing: Non weight bearing LLE Weight Bearing: Non weight bearing  Therapy Assessments Cognition Orientation Level: Oriented X4 Home Living Lives With: Spouse;Son (son has limitations and cannot provide much care) Receives Help From: Friend(s) Type of Home: House Home Layout: One level Home Access: Ramped entrance Bathroom Shower/Tub: Walk-in shower Bathroom Accessibility: Yes How Accessible: Accessible via wheelchair (pt uses scooter) Home Adaptive Equipment: Grab bars in shower;Grab bars around toilet Additional Comments: pt reports he has a cushion at home..not sure if it is a Patent examiner: Impaired by gross assessment Stereognosis: Impaired by gross assessment Hot/Cold: Impaired by gross assessment Proprioception: Impaired by gross assessment Additional Comments: diminished sensation below chest level at both L/E's  Pt has developed pressure ulcers at right lower leg , heel and sacrum that he was unable to detect Cognition Orientation Level: Oriented X4 Coordination Gross Motor Movements are Fluid and Coordinated: Yes (in  U/E's, not able to test in L/E's)  Prior Function: Prior Function Level of Independence: Requires assistive device for independence;Needs assistance with ADLs Able to Take Stairs?: No Driving: Yes Vocation: Retired  ADLs/Mobility:  Current Functional Levels:    Bed Mobility Bed Mobility: Yes Rolling Right: 2: Max assist Rolling Right Details (indicate cue type and reason): use of bed rails....total assist to move B LE Rolling Left: 2: Max assist Rolling Left Details (indicate cue type and reason): total assist to move B LE Supine to Sit: 1: +2 Total assist Supine to Sit Details (indicate cue type and reason): Total assist + 2 pt 25% with increased strength/use B UE Sit to Supine - Right: 1: +2 Total assist Transfers Transfers: Yes Squat Pivot Transfers: 1: +1 Total assist Squat Pivot Transfer Details (indicate cue type and reason): Total assist + 1 pt  20% bed to chair Ambulation/Gait Ambulation/Gait: No Stairs: No Wheelchair Mobility Wheelchair Mobility: No Posture/Postural Control Posture/Postural Control: No significant limitations (core stability weakness, but able to activate) Static Sitting Balance Static Sitting - Balance Support: Bilateral upper extremity supported;Feet supported Static Sitting - Level of Assistance: 3: Mod assist Static Sitting - Comment/# of Minutes: sitting EOB x 9 min at [employer]  while performing trunk rotastion and extension TE's while supporting self with B UE's  Home Assistive Devices/Equipment:  Home Assistive Devices/Equipment Home Assistive Devices/Equipment: Other (Comment);Eyeglasses (scooter)  Discharge Planning:  Discharge Planning Living Arrangements: Spouse/significant other Support Systems: Spouse/significant other;Family members Assistance Needed: discussed with wife Do you have any problems obtaining your medications?: No Type of Residence: Private residence Teller: No Patient expects to be discharged to::  home Expected Discharge Date:  (unknown) Case Management Consult Needed: No  Prior Functional Levels:  Prior Functional Level Bed Mobility: mod I Transfers: mod I Mobility - Walk/Wheelchair: scooter Upper Body Dressing: Independent Lower Body Dressing: needed some assistance Grooming: Independent Eating/Drinking: Independent Toilet Transfer: Mod I Bladder Continence: self cathed approx. every 6 hrs Bowel Management: Independent Stair Climbing: No Communication: WNL Memory: WNL Cooking/Meal Prep: wife Housework: wife Money Management: Independent Driving: Independent    Previous Home Environment:  Previous Wellsite geologist: Spouse/significant other Support Systems: Spouse/significant other;Family members Assistance Needed: discussed with wife Do you have any problems obtaining your medications?: No Type of Residence: Private residence [place]: No Patient expects to be discharged to:: home Expected Discharge Date:  (unknown) Home Environment Number of Levels: 2 Previous Home Environment Number of Steps: ramp Previous Home Environment Is Bedroom on Main Floor?: Yes Previous Home Environment Is Bathroom on Main Floor?: Yes Discharge Living Setting:  Discharge Living Setting Plans for Discharge Living Setting: Patient's home;Lives with (comment);Other (Comment) (townhome, lives with wife and son) Discharge Living Setting Number of Levels: 2 Discharge Living Setting Number of Steps: ramp at entrance Discharge Living Setting is Bedroom on Main Floor?: Yes Discharge Living Setting is Bathroom on Main Floor?: Yes Social/Family/Support Systems:  Social/Family/Support Systems Patient Roles: Spouse;Parent Anticipated Caregiver: wife Anticipated Ambulance person Information: [phone], work [phone] Ability/Limitations of Caregiver: works 8-5; M-F Caregiver Availability: Other (Comment) (discussed plan of care with wife; son is limited  help; ) Discharge Plan Discussed with Primary Caregiver: Yes Is Caregiver In Agreement with Plan?: Yes Does Caregiver/Family have Issues with Lodging/Transportation while Pt is in Rehab?: No Goals/Additional Needs:  Goals/Additional Needs Patient/Family Goal for Rehab: to be mod I Cultural Considerations: none Dietary Needs: Diabetic Equipment Needs: to be determined Pt/Family Agrees to Admission and willing to participate: Yes Program Orientation Provided & Reviewed with Pt/Caregiver Including Roles  & Responsibilities: Yes  Preadmission Screen Completed By:  Bing Neighbors, 04/06/2011 1:30 PM  Patient's condition:  Please see physician update to information in consult dated 04/03/11 @1603 .  Preadmission Screen Competed [id number] Eleno Weimar,RN, [place] on 04/06/11.  Discussed status with Dr. Letta Pate on 04/06/11 at 1333 (time/date) and received telephone approval for admission today.  Admission Coordinator:  Bing Neighbors, time1335/Date12/3/12  .

## 2011-04-07 DIAGNOSIS — D62 Acute posthemorrhagic anemia: Secondary | ICD-10-CM

## 2011-04-07 DIAGNOSIS — Z5189 Encounter for other specified aftercare: Secondary | ICD-10-CM

## 2011-04-07 DIAGNOSIS — G35 Multiple sclerosis: Secondary | ICD-10-CM

## 2011-04-07 LAB — COMPREHENSIVE METABOLIC PANEL
ALT: 33 U/L (ref 0–53)
AST: 22 U/L (ref 0–37)
Albumin: 2.3 g/dL — ABNORMAL LOW (ref 3.5–5.2)
CO2: 21 mEq/L (ref 19–32)
Calcium: 8.6 mg/dL (ref 8.4–10.5)
Sodium: 140 mEq/L (ref 135–145)
Total Protein: 6.4 g/dL (ref 6.0–8.3)

## 2011-04-07 LAB — CBC
HCT: 25.4 % — ABNORMAL LOW (ref 39.0–52.0)
Hemoglobin: 8.4 g/dL — ABNORMAL LOW (ref 13.0–17.0)
MCH: 31.1 pg (ref 26.0–34.0)
MCHC: 33.1 g/dL (ref 30.0–36.0)
MCV: 94.1 fL (ref 78.0–100.0)
RBC: 2.7 MIL/uL — ABNORMAL LOW (ref 4.22–5.81)

## 2011-04-07 LAB — GLUCOSE, CAPILLARY
Glucose-Capillary: 194 mg/dL — ABNORMAL HIGH (ref 70–99)
Glucose-Capillary: 135 mg/dL — ABNORMAL HIGH (ref 70–99)

## 2011-04-07 LAB — DIFFERENTIAL
Basophils Relative: 0 % (ref 0–1)
Eosinophils Absolute: 0.3 10*3/uL (ref 0.0–0.7)
Lymphs Abs: 2 10*3/uL (ref 0.7–4.0)
Monocytes Absolute: 0.6 10*3/uL (ref 0.1–1.0)
Monocytes Relative: 7 % (ref 3–12)

## 2011-04-07 MED ORDER — BENEPROTEIN PO POWD
1.0000 | Freq: Three times a day (TID) | ORAL | Status: DC
  Administered 2011-04-07 – 2011-04-08 (×2): 6 g via ORAL
  Filled 2011-04-07 (×2): qty 227

## 2011-04-07 NOTE — Progress Notes (Signed)
Pt with constipation. Last BM 11-30th. Pt given fleets enema. Tolerated well. Had med formed stool on bed pan.

## 2011-04-07 NOTE — Plan of Care (Signed)
Problem: RH BOWEL ELIMINATION Goal: RH STG MANAGE BOWEL WITH ASSISTANCE minimum  Outcome: Not Progressing Required enema for bowel movement on 04/06/11 Goal: RH STG MANAGE BOWEL W/MEDICATION W/ASSISTANCE minimum  Outcome: Not Progressing Pt required an enema on 04/06/11 for elimination  Problem: RH BLADDER ELIMINATION Goal: RH STG MANAGE BLADDER WITH ASSISTANCE Minimum Outcome: Not Progressing Continue foley.  Goal: RH STG MANAGE BLADDER WITH EQUIPMENT WITH ASSISTANCE minimum  Outcome: Not Progressing Staff manages foley.

## 2011-04-07 NOTE — Progress Notes (Signed)
Inpatient Rehabilitation Center Individual Statement of Services  Patient Name:  Lance [name]  Date:  04/07/2011  Welcome to the [facility].  Our goal is to provide you with an individualized program based on your diagnosis and situation, designed to meet your specific needs.  With this comprehensive rehabilitation program, you will be expected to participate in at least 3 hours of rehabilitation therapies Monday-Friday, with modified therapy programming on the weekends.  Your rehabilitation program will include the following services:  Physical Therapy (PT), Occupational Therapy (OT), 24 hour per day rehabilitation nursing, Therapeutic Recreaction (TR), Case Management (RN and Education officer, museum), Rehabilitation Medicine, Nutrition Services and Pharmacy Services  Weekly team conferences will be held on  Tuesday  to discuss your progress.  Your RN Case Writer will talk with you frequently to get your input and to update you on team discussions.  Team conferences with you and your family in attendance may also be held.  Depending on your progress and recovery, your program may change.  Your RN Case Engineer, production will coordinate services and will keep you informed of any changes.  Your RN Tourist information centre manager and SW names and contact numbers are listed  below.  The following services may also be recommended but are not provided by the [facility] will be made to provide these services after discharge if needed.  Arrangements include referral to agencies that provide these services.  Your insurance has been verified to be:  Medicare + [employer] primary doctor is:  Dr. Lavone Orn  Pertinent information will be shared with your doctor and your insurance company.  Case Manager:  Lutricia Feil, [facility] [phone]  Social Worker:  Lennart Pall, [place] [phone]  ELOS: 2-3 wks  Goals:  Supervision  Information discussed with pt & wife and copy given to them by: Theora Master, 04/07/2011, 6:13 PM

## 2011-04-07 NOTE — Patient Care Conference (Signed)
Inpatient RehabilitationTeam Conference Note Date: 04/07/2011   Time: 6:25 PM    Patient Name: Lance [name]      Medical Record Number: [id number]  Date of Birth: [birth year] Sex: Male         Room/Bed: 4007/4007-01 Payor Info: Payor: MEDICARE  Plan: MEDICARE PART A AND B  Product Type: *No Product type*     Admitting Diagnosis: MS DECONDITIONED  Admit Date/Time:  04/06/2011  4:07 PM Admission Comments: No comment available   Primary Diagnosis:  <principal problem not specified> Principal Problem: <principal problem not specified>  Patient Active Problem List  Diagnoses Date Noted  . MS (multiple sclerosis) pseudoexacerbation 04/07/2011  . Anemia associated with acute blood loss due to retro/intrapertitoneal hematomas 04/07/2011  . Acute renal failure (ARF) 04/02/2011    Class: Acute  . Multiple sclerosis 03/30/2011  . Diabetes mellitus 03/30/2011  . UTI (lower urinary tract infection) 03/30/2011  . Sacral decubitus ulcer 03/30/2011  . Ulcer of ankle 03/30/2011  . Anemia 03/30/2011  . Nephrolithiasis 03/24/2011    Expected Discharge Date: Expected Discharge Date: 04/24/11  Team Members Present: Physician: Dr. Alger Simons Case Manager Present: Lutricia Feil, RN Social Worker Present: Lennart Pall, LCSW PT Present: Canary Brim, PT OT Present: Chrys Racer, Starling Manns, OT Other (Discipline and Name): Daiva Nakayama, PPs Coordinator RN Present: Janyth Pupa    Current Status/Progress Goal Weekly Team Focus  Medical   Neurogenic bowel and bladder with recent retroperitoneal bleed exacerbating his premorbid clinical picture of MS.  Obtain medical stability. Follow up patient H&H.  Maintain activity tolerance. Optimized blood counts as well as bowel bladder function   Bowel/Bladder   Indwelling catheter. Last bowel movement on 04/06/11 after enema  Contient of bowel and bladder  Routine foley care. Monitor frequency of bowel movement and implement intervention if  necessary   Swallow/Nutrition/ Hydration             ADL's   Min assist-UB, Total assist-LB  Overall supervision  ADL retraining, increasing activity tolerance/endurance, sitting balance, sit to stands   Mobility   max A transfers with slideboard, min/modA sitting balance  S overall, minA car, mod I w/c mobility  functional transfers, general activity tolerance, strengthening   Communication             Safety/Cognition/ Behavioral Observations            Pain   No complaint  <3  Offer prn pain med 1  hour prior to initial therapy session   Skin   Skin abrasion to R shin. Open blister to R heel, enclosed blister to L hee as well as  Stage 2 to sacrum with allevyn dressing intact to all areas.  No additional skin breakdown  Change allevyn dressing q 3-5days      *See Interdisciplinary Assessment and Plan and progress notes for long and short-term goals  Barriers to Discharge:      Possible Resolutions to Barriers:       Discharge Planning/Teaching Needs:  Home with wife and adult son - wife works f/t days and son has mentally disabled, therefore, pt needs to be fairly independent w/c level as PTA.  Extended family may be able to provide support initially - [place].  TBD   Team Discussion: Supervision goals.  Wife may take FMLA.     Revisions to Treatment Plan: none    Continued Need for Acute Rehabilitation Level of Care: The patient requires daily medical management by  a physician with specialized training in physical medicine and rehabilitation for the following conditions: Daily direction of a multidisciplinary physical rehabilitation program to ensure safe treatment while eliciting the highest outcome that is of practical value to the patient.: Yes Daily medical management of patient stability for increased activity during participation in an intensive rehabilitation regime.: Yes Daily analysis of laboratory values and/or radiology reports with any  subsequent need for medication adjustment of medical intervention for : Post surgical problems;Neurological problems  Theora Master 04/07/2011, 6:25 PM

## 2011-04-07 NOTE — Progress Notes (Signed)
Physical Therapy Note  Patient Details  Name: Lance [name] MRN: [id number] Date of Birth: 18-Mar-1941 Today's Date: 04/07/2011  14:00- 15:10  Individual therapy, pt. Denies pain. Session focused on education reguarding energy conservation, managing MS symptoms by controlling temperature and HR with activity.  Pt. Taught pressure relief in WC.  he requires assistance to lean forward on elbows due to weak trunk, but he can perform lateral leans.  Pt encouraged to call for assistance to perform pressure relief.  Pt. given handouts on energy conservation.  WC mobility increased HR 25bpm and it took over 10 minutes to return to baseline.  Sliding board transfer max to total assist with VC for position and communication with person assisting to conserve energy.  Lance [name] 04/07/2011, 4:05 PM

## 2011-04-07 NOTE — Progress Notes (Signed)
Social Work Assessment and Plan Assessment and Plan  Patient Name: Lance [name]  [id number] Date: 04/07/2011  Problem List:  Patient Active Problem List  Diagnoses  . Nephrolithiasis  . Multiple sclerosis  . Diabetes mellitus  . UTI (lower urinary tract infection)  . Sacral decubitus ulcer  . Ulcer of ankle  . Anemia  . Acute renal failure (ARF)  . MS (multiple sclerosis) pseudoexacerbation  . Anemia associated with acute blood loss due to retro/intrapertitoneal hematomas    Past Medical History:  Past Medical History  Diagnosis Date  . Hypertension   . Blood transfusion   . Neuralgia neuritis, sciatic nerve     MS for 30 years  . Diabetes mellitus   . PONV (postoperative nausea and vomiting)     Past Surgical History:  Past Surgical History  Procedure Date  . Tonsillectomy   . Finger surgery 2004    Discharge Planning  Discharge Planning Living Arrangements: Children;Spouse/significant other Support Systems: Spouse/significant other Assistance Needed: was independentt at w/c level Case Management Consult Needed: Yes (Comment) (already following)  Social/Family/Support Systems Social/Family/Support Systems Patient Roles: Caregiver (adult son is mentally disabled and requires 24 hour supervis) Caregiver Availability: Evenings only (may be able to take some FMLA)  Employment Status Employment Status Employment Status: Disabled Date Retired/Disabled/Unemployed: approx 20 years Freight forwarder Issues: none Guardian/Conservator: none  Abuse/Neglect    Emotional Status Emotional Status Pt's affect, behavior adn adjustment status: pleasant, oriented and talkative gentleman - motivated for therapies.  Denies any s/s emotional distress - merely frustration with current level of debility.  BDI screen deferred at this time but will monitor. Recent Psychosocial Issues: none noted Pyschiatric History: none  Patient/Family Perceptions, Expectations  & Goals Pt/Family Perceptions, Expectations and Goals Pt/Family understanding of illness & functional limitations: pt able to give detailed account of medical issues leading to current deconditioned state and need for CIR tx; good understanding of curretn limitations Premorbid pt/family roles/activities: pt independent from w/c PTA; provides supervision to adult, disabled son; wife works f/t Anticipated changes in roles/activities/participation: wife may need to assume more household duties initially until pt's strength returns to baseline Pt/family expectations/goals: "I want to be back to my baseline, but I know it won't happen overnight"  [place]: None Premorbid Home Care/DME Agencies: None Transportation available at discharge: yes  Discharge Research scientist (life sciences) Resources: Education officer, museum (specify) ([employer]) Museum/gallery curator Resources: Social Security;Other (Comment) (wife's employment) Financial Screen Referred: No Living Expenses: Motgage Money Management: Patient;Spouse Home Management: pt, spouse Patient/Family Preliminary Plans: home with wife and adult, disabled son Barriers to Discharge: Other (Comment) (son cannot provide much assistance)  Clinical Impression:  Pleasant, talkative, fully oriented gentleman who is very motivated for tx.  Wife very supportive but does work full-time days - could possible take a few days off - extended family may also be available to [place], [place] 04/07/2011

## 2011-04-07 NOTE — Progress Notes (Signed)
Occupational Therapy Assessment and Plan and Session Notes  Patient Details  Name: Lance [name] MRN: [id number] Date of Birth: 10/05/1940  OT Diagnosis: muscle weakness (generalized) Rehab Potential: Rehab Potential: Good ELOS: 2-3 weeks   Today's Date: 04/07/2011  Assessment & Plan Clinical Impression:Lance [name] is an 70 y.o. Male with neurogenic bladder due to multiple sclerosis who was admitted for lithotripsy 11/19 and D/C next day Readmitted 11/26 with weakness, inability to perform his electric wheel chair transfers and noted to be febrile in ED. Patient with hgb of 6.2. CT abdomen with right perinephric hematoma and right retroperitoneal hematoma. Started on antibiotics for UTI and treated with 2units PRBCs. Acute renal failure ( ATN due to hypotension and meds ) treated with IVF. Sacral decub monitored by WOC. Had been on bedrest till friday. Therapies initiated and patient noted to be deconditioned. Working on balance at edge of bed. Continues with foley with recommendations for 10 days antibiotic therapy. Patient transferred to CIR on 04/06/2011 .  Patient's past medical history is significant for: Hypertension   Blood transfusion   Neuralgia neuritis, sciatic nerve  MS for 30 years   Diabetes mellitus   PONV (postoperative nausea and vomiting)   Patient currently requires Minimal Assist for UB ADLs & Total Assist for LB ADLs secondary to muscle weakness and muscle paralysis. Prior to hospitalization, patient could complete basic ADLs at an  overall modified independent level.  Patient will benefit from skilled intervention to increase independence with basic self-care skills prior to discharge home with care partner.  Anticipate patient will require 24 hour supervision and follow up home health.  OT Assessment Rehab Potential: Good Barriers to Discharge: Decreased caregiver support Barriers to Discharge Comments: Patient lives with wife and son. However neither  are home 24/7 at this time OT Plan OT Frequency: 1-2 X/day, 60-90 minutes Estimated Length of Stay: 2-3 weeks OT Treatment/Interventions: Balance/vestibular training;Community reintegration;DME/adaptive equipment instruction;Functional mobility training;Neuromuscular re-education;Pain management;Patient/family education;Self Care/advanced ADL retraining;Therapeutic Activities;Therapeutic Exercise;UE/LE Strength taining/ROM;UE/LE Coordination activities;Wheelchair propulsion/positioning OT Recommendation Follow Up Recommendations: Home health OT Equipment Recommended: 3 in 1 bedside comode;Tub/shower bench  Precautions/Restrictions  Precautions Precautions: Fall Required Braces or Orthoses: No  General Chart Reviewed: Yes  Pain Pain Assessment Pain Assessment: No/denies pain Pain Score: 0-No pain  Home Living/Prior Functioning Home Living Lives With: Spouse;Son [place] Help From: Family Type of Home: House (townhome) Home Layout: Two level (patient stays on first floor) Home Access: Ramped entrance Bathroom Shower/Tub: Multimedia programmer: Standard Bathroom Accessibility: Yes How Accessible: Accessible via wheelchair [place]: Grab bars in shower;Hand-held shower hose;Grab bars around toilet;Shower chair with back IADL History Homemaking Responsibilities: No Current License: Yes Mode of Transportation: Printmaker Prior Function Level of Independence: Requires assistive device for independence;Needs assistance with ADLs;Needs assistance with homemaking Able to Take Stairs?: No Driving: Yes  ADL ADL Eating: Set up Where Assessed-Eating: Bed level Grooming: Not assessed Upper Body Bathing: Not assessed Lower Body Bathing: Dependent Where Assessed-Lower Body Bathing: Bed level Upper Body Dressing: Minimal assistance Where Assessed-Upper Body Dressing: Bed level Lower Body Dressing: Dependent Where Assessed-Lower Body Dressing: Bed level Toileting:  Not assessed Toilet Transfer: Not assessed Tub/Shower Transfer: Not assessed Walk-In Shower Transfer: Not assessed  Vision/Perception  Vision - History Baseline Vision: Wears glasses all the time Patient Visual Report: No change from baseline   Cognition Overall Cognitive Status: Appears within functional limits for tasks assessed Arousal/Alertness: Awake/alert Orientation Level: Oriented X4  Sensation Sensation Additional Comments: Appears intact for UEs  Coordination Gross Motor Movements are Fluid and Coordinated: Yes (UEs) Fine Motor Movements are Fluid and Coordinated: Yes   Extremity/Trunk Assessment RUE Assessment RUE Assessment: Within Functional Limits LUE Assessment LUE Assessment: Within Functional Limits  Recommendations for other services: Other: None at this time  Discharge Criteria: Patient will be discharged from OT if patient refuses treatment 3 consecutive times without medical reason, if treatment goals not met, if there is a change in medical status, if patient makes no progress towards goals or if patient is discharged from hospital.  The above assessment, treatment plan, treatment alternatives and goals were discussed and mutually agreed upon: by patient  Session Notes  Session One [id number] - 70 Minutes Individual Therapy No complaints of pain  Upon entering room patient supine in bed with HOB raised eating breakfast. Initial 1:1 OT evaluation completed. Treatment focus on ADL retraining at bed level, bed mobility, and increasing activity tolerance/endurance. Discussed home set up with patient with patient explaining functional aspects and mobility independence prior to hospitalization. Per patient report he was at a w/c level and would perform stand pivot transfers using UE support at a modified independent level.   Session Two 1030-1100 - 30 Minutes Individual Therapy No complaints of pain  Treatment focus on w/c propulsion maneuvering around  hallway and around ADL apartment. Introduction to [facility] to fit over toilet seat for height & to increase independence with toilet transfers and introduction to tub transfer bench for walk in shower. Also engaged in UE strengthening exercises using theraband and dumbells to improve UB strength, increase overall activity tolerance/endurance and to increase independence with transfers. Encouraged patient to facilitate UE strengthening exercises during down time during the day. Prior to hospitalization, patient was using dumbells for UB strengthening.   Deniece Rankin 04/07/2011, 10:06 AM

## 2011-04-07 NOTE — Progress Notes (Signed)
Physical Therapy Assessment and Plan  Patient Details  Name: Lance [name] MRN: [id number] Date of Birth: 1940/10/05  PT Diagnosis: Abnormal posture, Difficulty walking, Hypertonia, Impaired sensation and Muscle weakness Rehab Potential: Good ELOS: 2-3 weeks   Today's Date: 04/07/2011 Time: 0900-0955 Time Calculation (min): 55 min  Assessment & Plan Clinical Impression: Patient is a 70 y.o. year old male with recent admission to the hospital on 11/19 for lithotripsy and discharged the next day. Pt was readmitted 11/26 with weakness, inability to perform his electric wheel chair transfers and noted to be febrile in ED. Patient had CT of abdomen with right perinephric hematoma and right retroperitoneal hematoma. Started on antibiotics for UTI and treated with 2units PRBCs. Acute renal failure Sacral decub monitored by WOC. Had been on bedrest till friday. .  Patient transferred to CIR on 04/06/2011 . Past medical history significant for Hypertension, Blood transfusion, MS for 30 years, and Diabetes mellitus    Patient currently requires max to total A with mobility secondary to muscle weakness and muscle joint tightness, decreased cardiorespiratoy endurance, abnormal tone, unbalanced muscle activation and decreased coordination and decreased sitting balance, decreased standing balance, decreased postural control and decreased balance strategies.  Prior to hospitalization, patient was independent with mobility and lived with Spouse;Son in a House home.  Home access is  Ramped entrance.  Patient will benefit from skilled PT intervention to maximize safe functional mobility, minimize fall risk and decrease caregiver burden for planned discharge home with 24 hour supervision.  Anticipate patient will benefit from follow up [place] at discharge.  PT - End of Session Activity Tolerance: Tolerates 30+ min activity with multiple rests Endurance Deficit: Yes Endurance Deficit Description: general  deconditioning PT Assessment Rehab Potential: Good Barriers to Discharge: Decreased caregiver support (wife and son only able to provide supervision per report) PT Plan PT Frequency: 1-2 X/day, 60-90 minutes Estimated Length of Stay: 2-3 weeks PT Treatment/Interventions: Ambulation/gait training (as appropraite for WB/pre gait));Balance/vestibular training;Community Radiographer, therapeutic;Functional mobility training;Neuromuscular re-education;Pain management;Patient/family education;Splinting/orthotics;Therapeutic Activities;Therapeutic Exercise;UE/LE Strength taining/ROM;UE/LE Coordination activities;Wheelchair propulsion/positioning ( PT Recommendation Follow Up Recommendations: Home health PT Equipment Recommended: Sliding board (possible for car transfer), has electric scooter at home that he uses for mobility  Precautions/Restrictions Precautions Precautions: Fall Required Braces or Orthoses: No    Pain Pain Assessment Pain Assessment: No/denies pain Pain Score: 0-No pain Home Living/Prior Functioning Home Living Lives With: Spouse;Son [place] Help From:  (family only able to really provide supervision per pt report) Type of Home: House Home Layout: Two level Home Access: Ramped entrance Bathroom Shower/Tub: Multimedia programmer: Standard Bathroom Accessibility: Yes How Accessible: Accessible via wheelchair [place]: Transport planner Prior Function Level of Independence: Requires assistive device for independence;Needs assistance with ADLs;Needs assistance with homemaking Able to Take Stairs?: No Driving: Yes Vision/Perception  Vision - History Baseline Vision: Wears glasses all the time Patient Visual Report: No change from baseline Perception Perception: Within Functional Limits  Cognition Overall Cognitive Status: Appears within functional limits for tasks assessed Arousal/Alertness: Awake/alert Orientation  Level: Oriented X4 Safety/Judgment: Appears intact Sensation Sensation Light Touch: Impaired by gross assessment (in lower extremeties) Proprioception: Impaired by gross assessment (in lower extremities) Additional Comments: Appears intact for UEs Coordination Gross Motor Movements are Fluid and Coordinated: No (in lower extremities, increased tone in RLE) Fine Motor Movements are Fluid and Coordinated: Yes Coordination and Movement Description: decreased coordination in lower extremities, R > L Motor  Motor Motor: Abnormal tone;Abnormal postural alignment and control  Mobility  Bed Mobility Bed Mobility: Yes Rolling Right: 3: Mod assist Supine to Sit: 2: Max assist Transfers Transfers: Yes Lateral/Scoot Transfers: 1: +2 Total assist;Patient percentage (comment) (pt = 40%) Lateral/Scoot Transfer Details: Verbal cues for technique;Verbal cues for safe use of DME/AE;Manual facilitation for weight shifting;Manual facilitation for placement Locomotion  Ambulation Ambulation: No Stairs / Additional Locomotion Stairs: No Wheelchair Mobility Wheelchair Mobility: Yes Wheelchair Assistance: 5: Careers information officer: Both upper extremities Wheelchair Parts Management: Needs assistance Distance: 120  Trunk/Postural Assessment  Cervical Assessment Cervical Assessment:  (forward head) Thoracic Assessment Thoracic Assessment:  (flexed posture in sitting) Lumbar Assessment Lumbar Assessment: Exceptions to [facility] (limited dissociation in lumbar/trunk with mobility observed) Postural Control Postural Control: Deficits on evaluation Trunk Control: decreased dissociation in trunk/pelvis during mobility  Balance Balance Balance Assessed: Yes Static Sitting Balance Static Sitting - Balance Support: Bilateral upper extremity supported;Feet supported Static Sitting - Level of Assistance: 4: Min assist Dynamic Sitting Balance Dynamic Sitting - Level of Assistance: 3: Mod  assist Static Standing Balance Static Standing - Level of Assistance: Not tested (comment) (unable at eval) Extremity Assessment  RUE Assessment RUE Assessment: Within Functional Limits LUE Assessment LUE Assessment: Within Functional Limits RLE Assessment RLE Assessment: Exceptions to [facility] RLE Strength RLE Overall Strength: Due to premorbid status RLE Overall Strength Comments: no active movement against gravity noted, trace ankle motion noted RLE Tone RLE Tone: Hypertonic Hypertonic Details: clonus noted on RLE LLE Assessment LLE Assessment: Exceptions to WFL LLE PROM (degrees) LLE Overall PROM Comments: WFL LLE Strength LLE Overall Strength Comments: 3-/5 strength overall LLE Tone LLE Tone Comments: slight resistance noted with PROM, significantly  less than on R  Recommendations for other services: None  Discharge Criteria: Patient will be discharged from PT if patient refuses treatment 3 consecutive times without medical reason, if treatment goals not met, if there is a change in medical status, if patient makes no progress towards goals or if patient is discharged from hospital.  The above assessment, treatment plan, treatment alternatives and goals were discussed and mutually agreed upon: by patient  Treatment initiated this session with focus on functional mobility training, UE strengthening with w/c propulsion and introduced slideboard transfer as means of transfer at this time. Attempted to get into standing position with steady but pt unable at eval.   Allayne Gitelman 04/07/2011, 11:39 AM

## 2011-04-07 NOTE — Consult Note (Signed)
Wound care follow-up:  Pt previously with extensive deep tissue injury to buttocks and sacrum, present on admission.  This has evolved into stage 2 wound over buttocks and sacrum. 3X5X.1cm, dark red moist wound bed with mod tan drainage, no odor. Right leg with partial thickness wound, 2X1X.1cm pink and dry. Right heel 6X6X.1cm stage 2 wound which is ruptured blister, red moist wound bed with mod yellow drainage, no odor Left heel with intact clear fluid filled blister; stage 2 3X3cm. All wounds were present on admission.   Plan:  Air mattress to decrease pressure, keep turned off affected area.  Float heels when in bed; pt has prevalon boot.  Foam dressing to protect sites, absorb drainage, and promote healing.   Will not plan to follow further unless re-consulted.  [address] [place], MSN, [place]

## 2011-04-07 NOTE — Progress Notes (Signed)
Recreational Therapy Assessment and Plan  Patient Details  Name: Lance [name] MRN: [id number] Date of Birth: 1941-05-04  Rehab Potential: Good ELOS: 2-3 weeks   Assessment Clinical Impression: Patient is a 70 y.o. year old male with recent admission to the hospital on 11/19 for lithotripsy and discharged the next day. Pt was readmitted 11/26 with weakness, inability to perform his electric wheel chair transfers and noted to be febrile in ED. Patient had CT of abdomen with right perinephric hematoma and right retroperitoneal hematoma. Started on antibiotics for UTI and treated with 2units PRBCs. Acute renal failure Sacral decub monitored by WOC. Had been on bedrest till friday. . Patient transferred to CIR on 04/06/2011 . Past medical history significant for Hypertension, Blood transfusion, MS for 30 years, and Diabetes mellitus.  Patient presents with decreased activity tolerance, decreased functional mobility, decreased balance limiting pt's independence with leisure/community pursuits.    Recreational Therapy Leisure History/Participation Premorbid leisure interest/current participation: Sports - Other (Comment);Crafts - Other (Comment);Community - Other (Comment);Community Administrator, sports (Comment) ("non-specific", whatever i have personal interest inBuilding surveyor ) Spiritual Interests: Church;Womens'[employer]'s Groups Other Leisure Interests: Reading;Computer (newspapers, news, world events) Awareness of Community Resources: Good-identify 3 post discharge leisure resources [employer] Appropriate for Education?: Yes Social interaction - Mood/Behavior: Cooperative Recreational Therapy Orientation Orientation -Reviewed with patient: Available activity resources Strengths/Weaknesses Patient Strengths/Abilities: Willingness to participate;Active premorbidly Patient weaknesses: Physical limitations  Plan Rec Therapy Plan Is patient appropriate for Therapeutic Recreation?: Yes Rehab  Potential: Good Treatment times per week: min 1 time per week for >20 minutes Estimated Length of Stay: 2-3 weeks Therapy Goals Achieved By:: Recreation/leisure participation;Group participation (Comment);Patient/family education;Adaptive equipment instruction;Community reintegration/education;1:1 session  Recommendations for other services: None  Discharge Criteria: Patient will be discharged from TR if patient refuses treatment 3 consecutive times without medical reason.  If treatment goals not met, if there is a change in medical status, if patient makes no progress towards goals or if patient is discharged from hospital.  The above assessment, treatment plan, treatment alternatives and goals were discussed and mutually agreed upon: by patient  [name] 04/07/2011, 5:01 PM

## 2011-04-07 NOTE — Progress Notes (Signed)
Patient ID: Lance [name], male   [name]: 1940-06-21, 70 y.o.   MRN: [id number] Subjective/Complaints: Review of Systems  Gastrointestinal: Positive for constipation.  Musculoskeletal: Positive for joint pain.  Neurological: Positive for tingling.  All other systems reviewed and are negative.  spasticity, chronic bowel and bladder issues.  Objective: Vital Signs: Blood pressure 116/65, pulse 89, temperature 99 F (37.2 C), temperature source Oral, resp. rate 20, SpO2 93.00%. No results found. No results found for this basename: WBC:2,HGB:2,HCT:2,PLT:2 in the last 72 hours No results found for this basename: NA:2,K:2,CL:2,CO2:2,GLUCOSE:2,BUN:2,CREATININE:2,CALCIUM:2 in the last 72 hours CBG (last 3)   Basename 04/07/11 0707 04/07/11 0026 04/06/11 1214  GLUCAP 96 93 222*    Wt Readings from Last 3 Encounters:  03/30/11 92.987 kg (205 lb)  03/24/11 92.987 kg (205 lb)  03/24/11 92.987 kg (205 lb)    Physical Exam:  General appearance: alert and no distress Head: Normocephalic, without obvious abnormality, atraumatic Eyes: conjunctivae/corneas clear. PERRL, EOM's intact. Fundi benign. Ears: normal TM's and external ear canals both ears Nose: Nares normal. Septum midline. Mucosa normal. No drainage or sinus tenderness. Throat: lips, mucosa, and tongue normal; teeth and gums normal Neck: no adenopathy, no carotid bruit, no JVD, supple, symmetrical, trachea midline and thyroid not enlarged, symmetric, no tenderness/mass/nodules Back: symmetric, no curvature. ROM normal. No CVA tenderness. Resp: clear to auscultation bilaterally Cardio: regular rate and rhythm, S1, S2 normal, no murmur, click, rub or gallop GI: soft, non-tender; bowel sounds normal; no masses,  no organomegaly Extremities: extremities normal, atraumatic, no cyanosis or edema Pulses: 2+ and symmetric Skin: right calf breakdown.  bilateral heel blisters right greater than left Neurologic: hyperreflexic, RLE 1-2/5   LLE 2/5. RUE 3-4/5, LUE 4/5.  Sensory 1+/2 especially in lower extremities.  Positive clonus Incision/Wound: see above   Assessment/Plan: 1. Functional deficits secondary to MS with pseudoexacerbation related tor retroperitoneal bleed which require 3+ hours per day of interdisciplinary therapy in a comprehensive inpatient rehab setting. Physiatrist is providing close team supervision and 24 hour management of active medical problems listed below. Physiatrist and rehab team continue to assess barriers to discharge/monitor patient progress toward functional and medical goals.  Evals today.  Used scooter at home.  Didn't like powered chair when he used it previously.  Mobility:         ADL:   Cognition: Cognition Orientation Level: Oriented X4 Cognition Orientation Level: Oriented X4   Medical Problem List and Plan:  1. DVT Prophylaxis/Anticoagulation: Pharmaceutical: Lovenox. Patients hgb stable and will initiate lovenox  2. Pain Management: Continue prn vicodin. No complaints at present. 3. Mood: Monitor for now.   4. UTI: Continue septra DS for 10 total day. Monitor voiding pattern. 5. ABLA: iron supplement. Following HGB 6. Sacral decub and bilateral heel blisters: Continue air mattress overlay. Order additional prevalon boot and use both when in bed. Allevyn drsg to be changed 3-4 days. Nutritional supplements for wound healing.  7. MS with spasticity: No meds currently. Will look at pharmaceutical mgt based on tolerance of activity. 8. Diabetes Mellitus: Continue Amaryl and actos. Monitor BS with ac/hs cbg checks and use SSI for elevated BS.  Some lability so far.     LOS (Days) 1  [name],[name] 04/07/2011, 7:37 AM

## 2011-04-07 NOTE — Progress Notes (Signed)
Patient information reviewed and entered into [facility] system by Rogen Porte, RN, CRRN, PPS Coordinator.  Information including medical coding and functional independence measure will be reviewed and updated through discharge.     Per nursing patient was given "Data Collection Information Summary for Patients in Inpatient Rehabilitation Facilities with attached "Privacy Act Statement-Health Care Records" upon admission.   

## 2011-04-07 NOTE — Discharge Summary (Signed)
NAME:  Lance [name], Lance [name] NO.:  [id number]  MEDICAL RECORD NO.:  [id number]  LOCATION:  [id number]                         FACILITY:  [facility]  PHYSICIAN:  Delanna Ahmadi, M.D.  DATE OF BIRTH:  [birth year]  DATE OF ADMISSION:  03/30/2011 DATE OF DISCHARGE:  04/06/2011                              DISCHARGE SUMMARY   The patient was admitted with severe anemia, acute renal failure, and a UTI.  He was transfused 2 units PRBCs.  His hemoglobin came up to the mid 8s and then stabilized.  A CT scan of the abdomen showed a perinephric and retroperitoneal hematoma which was the source of his acute blood loss anemia.  The patient's hemoglobin remained stable throughout the rest of the hospitalization.  He was given IV fluids and his acute renal failure improved and creatinine was running about 1.9 at discharge which is close to his baseline.  He was started on empiric vancomycin and Zosyn for urinary tract infection, his urine culture grew out MRSA.  He was switched to vancomycin and then Septra and will complete a 10-day course of antibiotics.  He was seen in consultation by Dr. Jasmine December of Urology, who recommended conservative care of his perinephric hematoma and retroperitoneal hematoma.  His ultrasound and CT scan did not show obstruction to the kidney.  The patient has diabetes and this was treated with sliding scale insulin and then his outpatient medicines were restarted towards the end of his hospitalization.  He did have grade 2 sacral ulcer as well as ulcers on his right ankle which were treated by wound care with foam bandage and overlay mattress.  The patient was seen by Physical Therapy and it was felt that short-term rehabilitation stay would be appropriate.  The patient's ACE inhibitor had been held throughout the hospitalization because of acute renal failure.  This was not restarted at discharge, but will need to be restarted as an outpatient if his blood  pressure rises.  DISCHARGE DIAGNOSES: 1. Acute blood loss anemia. 2. Perinephric and retroperitoneal hematoma. 3. Acute renal failure. 4. Methicillin-resistant Staphylococcus aureus urinary tract     infection. 5. Diabetes mellitus type 2. 6. Hypertension. 7. Multiple sclerosis.  DISCHARGE MEDICATIONS:  Per medication reconciliation.  DISPOSITION:  To Inpatient Rehabilitation.  DIET:  Diabetic diet, medium calorie.  ACTIVITY:  As per Physical Therapy.  CODE STATUS:  Full code.          ______________________________ Delanna Ahmadi, M.D.     JJG/MEDQ  D:  04/07/2011  T:  04/07/2011  Job:  [id number]

## 2011-04-08 DIAGNOSIS — Z5189 Encounter for other specified aftercare: Secondary | ICD-10-CM

## 2011-04-08 DIAGNOSIS — G35 Multiple sclerosis: Secondary | ICD-10-CM

## 2011-04-08 LAB — GLUCOSE, CAPILLARY
Glucose-Capillary: 66 mg/dL — ABNORMAL LOW (ref 70–99)
Glucose-Capillary: 147 mg/dL — ABNORMAL HIGH (ref 70–99)
Glucose-Capillary: 135 mg/dL — ABNORMAL HIGH (ref 70–99)

## 2011-04-08 LAB — BASIC METABOLIC PANEL
CO2: 20 mEq/L (ref 19–32)
Calcium: 8.7 mg/dL (ref 8.4–10.5)
Chloride: 108 mEq/L (ref 96–112)
Creatinine, Ser: 2.41 mg/dL — ABNORMAL HIGH (ref 0.50–1.35)
GFR calc Af Amer: 30 mL/min — ABNORMAL LOW (ref >90)
Sodium: 136 mEq/L (ref 135–145)

## 2011-04-08 MED ORDER — PRO-STAT SUGAR FREE PO LIQD
30.0000 mL | Freq: Three times a day (TID) | ORAL | Status: DC
  Administered 2011-04-08 – 2011-04-09 (×2): 30 mL via ORAL
  Filled 2011-04-08 (×9): qty 30

## 2011-04-08 NOTE — Progress Notes (Signed)
CBG: 66  Treatment: 15 GM carbohydrate snack - breakfast  Symptoms: None  Follow-up CBG: [id number] CBG Result:117  Possible Reasons for Event: Unknown  Comments/MD notified:Dr Naaman Plummer notified- no new orders received.     Brucha Ahlquist, Warnell Bureau

## 2011-04-08 NOTE — Progress Notes (Signed)
Occupational Therapy Note  Patient Details  Name: Lance [name] MRN: [id number] Date of Birth: 12/19/1940 Today's Date: 04/08/2011  1050-1145 - 63 Minutes Individual Therapy No complaints of pain  Engaged in ADL retraining at sink level in seated position. Skilled therapy focusing on UB bathing at sink in w/c, LB dressing (including socks and shoes) in seated position in w/c performing lateral leans to pull pants up to waist, grooming tasks seated at sink in w/c with set-up assist, increasing overall activity tolerance/endurance, UB strengthening/endurance.   [name] 04/08/2011, 12:02 PM

## 2011-04-08 NOTE — Progress Notes (Signed)
Patient ID: SMIT TRIBLE, male   [name]: June 08, 1940, 70 y.o.   MRN: [id number] Patient ID: ANDRA MOTTON, male   [name]: [birth year], 70 y.o.   MRN: [id number] Subjective/Complaints: Review of Systems  Gastrointestinal: Positive for constipation.  Musculoskeletal: Positive for joint pain.  Neurological: Positive for tingling.  All other systems reviewed and are negative.  spasticity, chronic bowel and bladder issues unchanged.  Slept well last night. Sugar low this am  Objective: Vital Signs: Blood pressure 108/65, pulse 99, temperature 99 F (37.2 C), temperature source Oral, resp. rate 18, SpO2 99.00%. No results found.  Basename 04/07/11 0644  WBC 9.1  HGB 8.4*  HCT 25.4*  PLT 400    Basename 04/07/11 0644  NA 140  K 4.8  CL 109  CO2 21  GLUCOSE 101*  BUN 42*  CREATININE 2.14*  CALCIUM 8.6   CBG (last 3)   Basename 04/08/11 0720 04/07/11 2100 04/07/11 1644  GLUCAP 66* 135* 194*    Wt Readings from Last 3 Encounters:  03/30/11 92.987 kg (205 lb)  03/24/11 92.987 kg (205 lb)  03/24/11 92.987 kg (205 lb)    Physical Exam:  General appearance: alert and no distress Head: Normocephalic, without obvious abnormality, atraumatic Eyes: conjunctivae/corneas clear. PERRL, EOM's intact. Fundi benign. Ears: normal TM's and external ear canals both ears Nose: Nares normal. Septum midline. Mucosa normal. No drainage or sinus tenderness. Throat: lips, mucosa, and tongue normal; teeth and gums normal Neck: no adenopathy, no carotid bruit, no JVD, supple, symmetrical, trachea midline and thyroid not enlarged, symmetric, no tenderness/mass/nodules Back: symmetric, no curvature. ROM normal. No CVA tenderness. Resp: clear to auscultation bilaterally Cardio: regular rate and rhythm, S1, S2 normal, no murmur, click, rub or gallop GI: soft, non-tender; bowel sounds normal; no masses,  no organomegaly Extremities: extremities normal, atraumatic, no cyanosis or edema Pulses:  2+ and symmetric Skin: right calf breakdown.  bilateral heel blisters right greater than left Neurologic: hyperreflexic, RLE 1-2/5  LLE 2/5. RUE 3-4/5, LUE 4/5.  Sensory 1+/2 especially in lower extremities.  Positive clonus Incision/Wound: see above   Assessment/Plan: 1. Functional deficits secondary to MS with pseudoexacerbation related tor retroperitoneal bleed which require 3+ hours per day of interdisciplinary therapy in a comprehensive inpatient rehab setting. Physiatrist is providing close team supervision and 24 hour management of active medical problems listed below. Physiatrist and rehab team continue to assess barriers to discharge/monitor patient progress toward functional and medical goals.  Working on Leisure centre manager with therapies.  Mobility: Bed Mobility Bed Mobility: Yes Rolling Right: 3: Mod assist Supine to Sit: 2: Max assist Transfers Transfers: Yes Lateral/Scoot Transfers: 1: +2 Total assist;Patient percentage (comment) (pt = 40%) Ambulation/Gait Stairs: No Wheelchair Mobility Wheelchair Mobility: Yes Wheelchair Assistance: 5: Careers information officer: Both upper extremities Wheelchair Parts Management: Needs assistance Distance: 120 ADL:   Cognition: Cognition Overall Cognitive Status: Appears within functional limits for tasks assessed Arousal/Alertness: Awake/alert Orientation Level: Oriented X4 Safety/Judgment: Appears intact Cognition Arousal/Alertness: Awake/alert Orientation Level: Oriented X4   Medical Problem List and Plan:  1. DVT Prophylaxis/Anticoagulation: Pharmaceutical: Lovenox. Patients hgb stable and will initiate lovenox  2. Pain Management: Continue prn vicodin. No complaints at present. 3. Mood: No active issues.  4. UTI: Continue septra DS for 10 total day. Monitor voiding pattern. 5. ABLA: iron supplement. Following HGB 6. Sacral decub and bilateral heel blisters: Continue air mattress overlay. Order  additional prevalon boot and use both when in bed. Allevyn drsg. Nutritional supplements for wound  healing.  7. MS with spasticity: No meds currently. Will look at pharmaceutical mgt based on tolerance of activity. 8. Diabetes Mellitus: Continue Amaryl and actos. Monitor BS with ac/hs cbg checks and use SSI for elevated BS.  Low cbg this am, but ok to high the rest of the day yesterday.  Encouraged hs snack.  Follow only for now.    LOS (Days) 2  [name],[name] 04/08/2011, 8:18 AM

## 2011-04-08 NOTE — Progress Notes (Signed)
Per State Regulation 482.30 This chart was reviewed for medical necessity with respect to the patient's Admission/Duration of stay. Yesterday spoke w/ pt & his wife & son about Team Conference report:  ELOS: 04/24/11   Goal: Supervision.  Pt very pleased w/ therapies &, especially, what he is learning about energy conservation.   Theora Master                 Nurse Care Manager              Next Review Date: 03/11/11

## 2011-04-08 NOTE — Progress Notes (Signed)
Occupational Therapy Note  Patient Details  Name: Lance [name] MRN: [id number] Date of Birth: [birth year] Today's Date: 04/08/2011  [id number] - 12 Minutes Individual Therapy No complaints of pain  Engaged in therapeutic exercise focusing on sit to stands using grab bar in bathroom from w/c. Sit to stand x5 with total assist; patient unable to stand completely (more of a squat). Patient surprised he could do stand as much as he did.    [name] 04/08/2011, 3:35 PM

## 2011-04-08 NOTE — Progress Notes (Signed)
Physical Therapy Note  Patient Details  Name: Lance [name] MRN: [id number] Date of Birth: 1941/03/18 Today's Date: 04/08/2011  Individual therapy, 830-930 (60 min) No complaint of pain. Discussed and reviewed energy conservation and pressure relief techniques educated on yesterday. Pt stated he shared the information with his wife and she was accepting. Plan to have wife measure height of scooter and hopefully bring in scooter to practice with transfers during pt's stay here. Bed mobility retraining with pt managing lower extremities (discussed possibility of practicing with leg loops in future sessions), but requiring maxA to go from sidelying to sit. Also discussed option of using a hospital bed at home for use of rails and HOB elevation to assist with bed mobility. Transfer training with slideboard with modA, pt able to weightshift with improvement today and assist with scooting back into the w/c. Demonstrated pressure relief techniques accurately. Kinetron for LE strengthening from seated position (in w/c) on easiest setting with assist. Monitored pt HR - fluctuated from 98-116 with activity. Rest breaks frequent.   Canary Brim [facility] 04/08/2011, 12:35 PM

## 2011-04-08 NOTE — Progress Notes (Signed)
Physical Therapy Note  Patient Details  Name: Lance [name] MRN: [id number] Date of Birth: 1940-08-08 Today's Date: 04/08/2011  Individual therapy 1300-1343 Denies pain. Treatment session focused on transfer training with slideboard, pt managing lower extremities, w/c parts, and working towards placing slideboard in preparation for transfer. From w/c to/from mat in gym, pt able to complete transfer with minA. Edge of mat worked on dynamic sitting balance with forward and lateral weightshifts to place rings from R and L to bench in front of pt. Increased challenge by increasing distance as well as having pt have hand in lap when reaching with opposite hand. Overall close S/steady A intermittently. Pt pleased with improvements noted today with mobility. W/c propulsion x 120' with S, slower pace for general activity tolerance. HR between 85-107 bpm during session.    Canary Brim [facility] 04/08/2011, 1:48 PM

## 2011-04-09 LAB — CBC
Hemoglobin: 8.5 g/dL — ABNORMAL LOW (ref 13.0–17.0)
MCH: 30.7 pg (ref 26.0–34.0)
MCHC: 32.6 g/dL (ref 30.0–36.0)
Platelets: 381 10*3/uL (ref 150–400)
RBC: 2.77 MIL/uL — ABNORMAL LOW (ref 4.22–5.81)

## 2011-04-09 LAB — GLUCOSE, CAPILLARY
Glucose-Capillary: 149 mg/dL — ABNORMAL HIGH (ref 70–99)
Glucose-Capillary: 175 mg/dL — ABNORMAL HIGH (ref 70–99)

## 2011-04-09 NOTE — Progress Notes (Signed)
Patient ID: Lance [name], male   [name]: 1940-10-23, 70 y.o.   MRN: [id number] Patient ID: Lance [name], male   [name]: Jun 10, 1940, 70 y.o.   MRN: [id number] Patient ID: Lance [name], male   [name]: June 14, 1940, 70 y.o.   MRN: [id number] Subjective/Complaints: Review of Systems  Gastrointestinal: Positive for constipation.  Musculoskeletal: Positive for joint pain.  Neurological: Positive for tingling.  All other systems reviewed and are negative.  spasticity. Needs to have BM this am.  Slept well last night. Sugar low this am again.  Objective: Vital Signs: Blood pressure 106/68, pulse 90, temperature 98.8 F (37.1 C), temperature source Oral, resp. rate 18, weight 106.686 kg (235 lb 3.2 oz), SpO2 95.00%. No results found.  Basename 04/09/11 0645 04/07/11 0644  WBC 8.2 9.1  HGB 8.5* 8.4*  HCT 26.1* 25.4*  PLT 381 400    Basename 04/08/11 0658 04/07/11 0644  NA 136 140  K 5.0 4.8  CL 108 109  CO2 20 21  GLUCOSE 72 101*  BUN 45* 42*  CREATININE 2.41* 2.14*  CALCIUM 8.7 8.6   CBG (last 3)   Basename 04/08/11 2054 04/08/11 1631 04/08/11 1118  GLUCAP 121* 135* 147*    Wt Readings from Last 3 Encounters:  04/08/11 106.686 kg (235 lb 3.2 oz)  03/30/11 92.987 kg (205 lb)  03/24/11 92.987 kg (205 lb)    Physical Exam:  General appearance: alert and no distress Head: Normocephalic, without obvious abnormality, atraumatic Eyes: conjunctivae/corneas clear. PERRL, EOM's intact. Fundi benign. Ears: normal TM's and external ear canals both ears Nose: Nares normal. Septum midline. Mucosa normal. No drainage or sinus tenderness. Throat: lips, mucosa, and tongue normal; teeth and gums normal Neck: no adenopathy, no carotid bruit, no JVD, supple, symmetrical, trachea midline and thyroid not enlarged, symmetric, no tenderness/mass/nodules Back: symmetric, no curvature. ROM normal. No CVA tenderness. Resp: clear to auscultation bilaterally Cardio: regular rate and rhythm,  S1, S2 normal, no murmur, click, rub or gallop GI: soft, non-tender; bowel sounds normal; no masses,  no organomegaly Extremities: extremities normal, atraumatic, no cyanosis or edema Pulses: 2+ and symmetric Skin: right calf breakdown.  bilateral heel blisters right greater than left Neurologic: hyperreflexic, RLE 1-2/5  LLE 2/5. RUE 3-4/5, LUE 4/5.  Sensory 1+/2 especially in lower extremities.  Positive clonus. No real changes from admit. Incision/Wound: see above   Assessment/Plan: 1. Functional deficits secondary to MS with pseudoexacerbation related tor retroperitoneal bleed which require 3+ hours per day of interdisciplinary therapy in a comprehensive inpatient rehab setting. Physiatrist is providing close team supervision and 24 hour management of active medical problems listed below. Physiatrist and rehab team continue to assess barriers to discharge/monitor patient progress toward functional and medical goals.  Working on Leisure centre manager with therapies.  Mobility: Bed Mobility Bed Mobility: Yes Rolling Right: 3: Mod assist Supine to Sit: 2: Max assist Transfers Transfers: Yes Lateral/Scoot Transfers: 1: +2 Total assist;Patient percentage (comment) (pt = 40%) Ambulation/Gait Stairs: No Wheelchair Mobility Wheelchair Mobility: Yes Wheelchair Assistance: 5: Careers information officer: Both upper extremities Wheelchair Parts Management: Needs assistance Distance: 120 ADL:   Cognition: Cognition Overall Cognitive Status: Appears within functional limits for tasks assessed Arousal/Alertness: Awake/alert Orientation Level: Oriented X4 Safety/Judgment: Appears intact Cognition Arousal/Alertness: Awake/alert Orientation Level: Oriented X4   Medical Problem List and Plan:  1. DVT Prophylaxis/Anticoagulation: Pharmaceutical: Lovenox. Patients hgb stable and will initiate lovenox   2. Pain Management: Continue prn vicodin. No complaints at  present.  3. Mood:  No active issues.  4. UTI: Continue septra DS for 10 total day. Monitor voiding pattern.  5. ABLA: iron supplement. Following HGB  6. Sacral decub and bilateral heel blisters: Continue air mattress overlay. Order additional prevalon boot and use both when in bed. Allevyn drsg. Nutritional supplements for wound healing. Appreciate WOC RN f/u   7. MS with spasticity: No meds currently. Will look at pharmaceutical mgt based on tolerance of activity. There appears to be no major increase from baseline.  8. Diabetes Mellitus: Continue Amaryl and actos. Monitor BS with ac/hs cbg checks and use SSI for elevated BS.   Low cbg again this am, but ok to high the rest of the day yesterday.  Encouraged hs snack.  I'm willing to accept lower cbg's as long as daytime numbers are reasonable.     LOS (Days) 3  Shereda Graw T 04/09/2011, 8:01 AM     A FACE TO FACE EVALUATION WAS PERFORMED

## 2011-04-09 NOTE — Progress Notes (Signed)
Occupational Therapy Session Note  Patient Details  Name: SAFEER GILLMAN MRN: [id number] Date of Birth: Jul 25, 1940  Today's Date: 04/09/2011 Time: 0815-0920 Time Calculation (min): 65 min  Precautions: Precautions Precautions: Fall Required Braces or Orthoses: No  Short Term Goals: OT Short Term Goal 1: Patient will perform UB dressing sitting EOB with supervision (LTG) OT Short Term Goal 2: Patient will bathe 10/10 body parts with moderate assistance OT Short Term Goal 3: Patient will perform toilet transfer with maximal assistance OT Short Term Goal 4: Patient will perform LB dressing with moderate assistance  Skilled Therapeutic Interventions/Progress Updates:  Pt seen for ADL retraining of toileting, bathing, and dressing with a focus on sitting balance, lateral weight shifting, sliding board transfers, and activity tolerance training.  Pt used sliding board for all transfers, but a second helper was needed to transfer off toilet as pt was very fatigued.  Pt utilized lateral leans to adjust clothing down on toilet, partially cleanse self, but needed increase assistance to pull pants up.             Pain Pain Assessment Pain Assessment: No/denies pain Pain Score: 0-No pain          Hadi Dubin 04/09/2011, 11:59 AM

## 2011-04-09 NOTE — Progress Notes (Signed)
Patient Details  Name: Lance [name] MRN: [id number] Date of Birth: [birth year]  Today's Date: 04/09/2011 Time: 10-10:30  Skilled Therapeutic Interventions/Progress Updates: Pt expressed frustration with feelings of nausea and stomach upset as a result of a needed protein supplement.  Pt states that he is distracted by side effects and was disappointed in therapy participation thus far today.  Emotional support provided.  Discussed stress management techniques- pt participated in these techniques independently seated w/c level.  No c/o pain.  Therapy/Group: Group Therapy  Activity Level: Simple:  Level of assist: Independent  Ayla Dunigan 04/09/2011, 4:30 PM

## 2011-04-09 NOTE — Progress Notes (Signed)
Physical Therapy Note  Patient Details  Name: Lance [name] MRN: [id number] Date of Birth: June 06, 1940 Today's Date: 04/09/2011  Individual therapy 1410-1500 Denies pain, a little fatigued. Treatment session focused on use of standing frame for postural control, weightbearing through lower extremities, stretching of hamstrings and heel cords, and standing tolerance. Pt able to tolerate up to 3 minutes in standing position, several repetitions. Significantly L lean noted, decreased WB on the R, unable to correct with cueing and max manual facilitation. Able to take one hand off at a time and maintain balance, increased difficulty taking L hand off for support and unable to maintain erect posture.   Canary Brim [facility] 04/09/2011, 3:12 PM

## 2011-04-09 NOTE — Progress Notes (Signed)
Occupational Therapy Session Note  Patient Details  Name: Lance [name] MRN: [id number] Date of Birth: 21-Jul-1940  Today's Date: 04/09/2011 Time: 1310-1340 Time Calculation (min): 30 min  Precautions: Precautions Precautions: Fall Required Braces or Orthoses: No  Short Term Goals: OT Short Term Goal 1: Patient will perform UB dressing sitting EOB with supervision (LTG) OT Short Term Goal 2: Patient will bathe 10/10 body parts with moderate assistance OT Short Term Goal 3: Patient will perform toilet transfer with maximal assistance OT Short Term Goal 4: Patient will perform LB dressing with moderate assistance  Skilled Therapeutic Interventions/Progress Updates:    Engaged in ther ex in seated position.  3 reps of 15 focus on biceps, triceps, and adductors with 5# weights.  Theraband exercises engaging PNF pattern and biceps/triceps.  Educated pt on importance in UE strengthening and its benefit towards assisting with sit to stand and transfers.  Further education on energy conservation and strategies to use at home.  Pain Pain Assessment Pain Assessment: No/denies pain  Therapy/Group: Individual Therapy  Sim Boast 04/09/2011, 1:43 PM

## 2011-04-09 NOTE — Progress Notes (Signed)
Patient is alert an oriented x3. He has not complained of pain this shift. Foley cath patent draining yellow urine. Blood glucose levels required sliding scale insulin. See MAR. Contact isolation precautions observed. Continue current plan of care.

## 2011-04-09 NOTE — Progress Notes (Signed)
Physical Therapy Session Note  Patient Details  Name: Lance [name] MRN: [id number] Date of Birth: 06/19/1940  Today's Date: 04/09/2011 Time: 1100-1200 Time Calculation (min): 60 min  Precautions: Precautions Precautions: Fall Required Braces or Orthoses: No  Short Term Goals: PT Short Term Goal 1: Pt will be able to perform bed mobility with minA PT Short Term Goal 2: Pt will be able to perform functional transfers with modA PT Short Term Goal 3: Pt will be able to propel w/c x 150 with supervision in controlled environment for UE strengthening PT Short Term Goal 4: Pt will be able to maintain dynamic sitting balance with steady A during functional tasks  Skilled Therapeutic Interventions/Progress Updates: Focus of treatment: 1. Transfer training with sliding board 2. Therapeutic activities to improve dynamic sitting balance 3. Neuro reed bilateral LEs in gravity eliminated position     General Pt. up in wc  Vital Signs Resting pusle 94 BPM  Oxygen sats > 92% RA  Pain Pain Assessment Pain Assessment: No/denies pain Mobility Transfers- Pt transferred to/from wc to mat with sliding board placement and min/mod assist for balance  Sit ot supine mod/max assist (mat) ; supine to side to sit mod/max assist (mat) Locomotion Pt propelled wc to/from room (approximately 120 feet) SBA with one rest break     Balance Pt able to maintain static sitting with unilateral UE support; pt able to lift [name] UEs and maintain sitting position; pt required assist for balance when rotating trunk to reach to contralateral side  Pt. Performed reaching activities to right and left focusing on reaching during trunk rotation.   Exercises Pt. Performed active-assistive (gravity eliminated) bilateral hip flexion-extension to allow increased AROM through available range       Therapy/Group: Individual Therapy  [name],JIM 04/09/2011, 12:43 PM

## 2011-04-10 LAB — GLUCOSE, CAPILLARY: Glucose-Capillary: 74 mg/dL (ref 70–99)

## 2011-04-10 MED ORDER — COLLAGENASE 250 UNIT/GM EX OINT
TOPICAL_OINTMENT | Freq: Every day | CUTANEOUS | Status: DC
  Administered 2011-04-10: 09:00:00 via TOPICAL
  Administered 2011-04-11: 1 via TOPICAL
  Administered 2011-04-12 – 2011-04-22 (×10): via TOPICAL
  Filled 2011-04-10 (×3): qty 30

## 2011-04-10 MED ORDER — BACLOFEN 5 MG HALF TABLET
5.0000 mg | ORAL_TABLET | Freq: Three times a day (TID) | ORAL | Status: DC
  Administered 2011-04-10 – 2011-04-11 (×6): 5 mg via ORAL
  Filled 2011-04-10 (×10): qty 1

## 2011-04-10 MED ORDER — POLYETHYLENE GLYCOL 3350 17 G PO PACK
17.0000 g | PACK | Freq: Every day | ORAL | Status: DC
  Administered 2011-04-10 – 2011-04-23 (×14): 17 g via ORAL
  Filled 2011-04-10 (×18): qty 1

## 2011-04-10 MED ORDER — SORBITOL 70 % SOLN
30.0000 mL | Freq: Once | Status: AC
  Administered 2011-04-10: 30 mL via ORAL
  Filled 2011-04-10: qty 30

## 2011-04-10 NOTE — Progress Notes (Signed)
Occupational Therapy Session Note  Patient Details  Name: Lance [name] MRN: [id number] Date of Birth: May 15, 1940  Today's Date: 04/10/2011 Time: 0730-0830 Time Calculation (min): 60 min  Precautions: Precautions Precautions: Fall Required Braces or Orthoses: No  Short Term Goals: OT Short Term Goal 1: Patient will perform UB dressing sitting EOB with supervision (LTG) OT Short Term Goal 2: Patient will bathe 10/10 body parts with moderate assistance OT Short Term Goal 3: Patient will perform toilet transfer with maximal assistance OT Short Term Goal 4: Patient will perform LB dressing with moderate assistance  Skilled Therapeutic Interventions/Progress Updates:    Upon entering room, found patient supine in bed. Focused skilled therapy on bed mobility, LB bathing at bed level, LB dressing at bed level, UB bathing sitting edge of bed and UB dressing sitting edge of bed. Also engaged in edge of bed to scooter transfer using slide board with minimal assistance from therapist and functional mobility around room using patients personal scooter. Set-up patient with bed side table and food tray to eat breakfast sitting up; call bell within reach. Patient stated at end of session, "I feel better now that I'm sitting up".  Pain  No complaints of pain  Therapy/Group: Individual Therapy  [name] 04/10/2011, 8:34 AM

## 2011-04-10 NOTE — Progress Notes (Signed)
Physical Therapy Note  Patient Details  Name: Lance [name] MRN: [id number] Date of Birth: 1940-12-11 Today's Date: 04/10/2011  Individual therapy [id number] Denies pain. Pt's scooter from home is here, practiced transfers from scooter to mat with close S without use of sliding board, lateral scoot transfer level surface. Pt plans to have wife and son change out mattress at home to make the transfers level to decrease effort needed to get into bed. Practiced  bed mobility minA overall supine <-> sit with extra time and cues for technique. Recommended rolling to side, bringing lower extremities off edge of bed and then pushing up...the patient return demonstrated with S for cueing for technique. Used leg loops with second time for supine <-> sit and pt reports that he feels this helps a lot due to pt pulling on pant legs prior. Notified rehab tech to plan to measure and make leg loops for pt to use upon d/c. Pt pleased with this.    Canary Brim [facility] 04/10/2011, 9:01 AM

## 2011-04-10 NOTE — Progress Notes (Signed)
Transferred from scooter to bed with extra time and steadying assist. Remains continent of bowel. Last bowel movement 04/09/11. Foley to straight drain with clear, yellow, urine.  R shin wound and R heel blister healing appropriately. New allevyn drsg reapplied. Quarter size' excoriation to L heel. Protective drsg reapplied. Stage 2 pressure ulcer with yellow slough area. Topical santyl ointment  and new drsg  to area.  No c/o pain. Able to make needs known.

## 2011-04-10 NOTE — Progress Notes (Signed)
Physical Therapy Note  Patient Details  Name: Lance [name] MRN: [id number] Date of Birth: [birth year] Today's Date: 04/10/2011  Individual therapy.  1300-1400  Denies pain. Treatment session focused on bed mobility retraining in ADL apartment using leg loops and pt able to complete at supervision level.Transfer training lateral scoot method from scooter to/from bed with steadyA and extra time to problem solve set up of scooter for safe transfer. Postural control, standing tolerance, and WB through lower extremities in standing frame x 3 min, x 2 min. Improved posture noted today with decreased L lateral lean. Pt stating he felt better standing up today and able to remove one upper extremity at a time.  Canary Brim [facility] 04/10/2011, 3:35 PM

## 2011-04-10 NOTE — Progress Notes (Signed)
Patient ID: JAMESRYAN KRAMMER, male   [name]: 11-30-1940, 70 y.o.   MRN: [id number] Patient ID: MCKENZIE NUDD, male   [name]: 1940/05/19, 70 y.o.   MRN: [id number] Patient ID: AVARI SWANK, male   [name]: [birth year], 70 y.o.   MRN: [id number] Patient ID: LYDON WENZINGER, male   [name]: 1940/06/04, 70 y.o.   MRN: [id number] Subjective/Complaints: Review of Systems  Gastrointestinal: Positive for constipation.  Musculoskeletal: Positive for joint pain.  Neurological: Positive for tingling.  All other systems reviewed and are negative.  spasticity near baseline  Objective: Vital Signs: Blood pressure 132/56, pulse 83, temperature 98.6 F (37 C), temperature source Oral, resp. rate 18, weight 106.686 kg (235 lb 3.2 oz), SpO2 96.00%. No results found.  Basename 04/09/11 0645  WBC 8.2  HGB 8.5*  HCT 26.1*  PLT 381    Basename 04/08/11 0658  NA 136  K 5.0  CL 108  CO2 20  GLUCOSE 72  BUN 45*  CREATININE 2.41*  CALCIUM 8.7   CBG (last 3)   Basename 04/10/11 0709 04/09/11 2047 04/09/11 1637  GLUCAP 74 175* 149*    Wt Readings from Last 3 Encounters:  04/08/11 106.686 kg (235 lb 3.2 oz)  03/30/11 92.987 kg (205 lb)  03/24/11 92.987 kg (205 lb)    Physical Exam:  General appearance: alert and no distress Head: Normocephalic, without obvious abnormality, atraumatic Eyes: conjunctivae/corneas clear. PERRL, EOM's intact. Fundi benign. Ears: normal TM's and external ear canals both ears Nose: Nares normal. Septum midline. Mucosa normal. No drainage or sinus tenderness. Throat: lips, mucosa, and tongue normal; teeth and gums normal Neck: no adenopathy, no carotid bruit, no JVD, supple, symmetrical, trachea midline and thyroid not enlarged, symmetric, no tenderness/mass/nodules Back: symmetric, no curvature. ROM normal. No CVA tenderness. Resp: clear to auscultation bilaterally Cardio: regular rate and rhythm, S1, S2 normal, no murmur, click, rub or gallop GI: soft,  non-tender; bowel sounds normal; no masses,  no organomegaly Extremities: extremities normal, atraumatic, no cyanosis or edema  Pulses: 2+ and symmetric Skin: right calf breakdown.  bilateral heel blisters right greater than left , sacrum clean with slight fibronecrotic tissue and little drainage Neurologic: hyperreflexic, RLE 1-2/5  LLE 2/5. RUE 3-4/5, LUE 4/5.  Sensory 1+/2 especially in lower extremities.  Positive clonus.  Incision/Wound: see above   Assessment/Plan: 1. Functional deficits secondary to MS with pseudoexacerbation related tor retroperitoneal bleed which require 3+ hours per day of interdisciplinary therapy in a comprehensive inpatient rehab setting. Physiatrist is providing close team supervision and 24 hour management of active medical problems listed below. Physiatrist and rehab team continue to assess barriers to discharge/monitor patient progress toward functional and medical goals.  Working on Leisure centre manager with therapies.  Mobility: Bed Mobility Bed Mobility: Yes Rolling Right: 3: Mod assist Supine to Sit: 2: Max assist Transfers Transfers: Yes Lateral/Scoot Transfers: 1: +2 Total assist;Patient percentage (comment) (pt = 40%) Ambulation/Gait Stairs: No Wheelchair Mobility Wheelchair Mobility: Yes Wheelchair Assistance: 5: Careers information officer: Both upper extremities Wheelchair Parts Management: Needs assistance Distance: 120 ADL:   Cognition: Cognition Overall Cognitive Status: Appears within functional limits for tasks assessed Arousal/Alertness: Awake/alert Orientation Level: Oriented X4 Safety/Judgment: Appears intact Cognition Arousal/Alertness: Awake/alert Orientation Level: Oriented X4   Medical Problem List and Plan:  1. DVT Prophylaxis/Anticoagulation: Pharmaceutical: Lovenox. Patients hgb stable and will initiate lovenox   2. Pain Management: Continue prn vicodin. No complaints at present.  3. Mood: No  active issues.  4. UTI: Continue  septra DS for 10 total day. Foley  5. ABLA: iron supplement. Following HGB  6. Sacral decub and bilateral heel blisters: Continue air mattress overlay. Order additional prevalon boot and use both when in bed. Allevyn drsg.  Consider wet-dry or santyl dressing.    7. MS with spasticity: No meds currently. Will look at pharmaceutical mgt based on tolerance of activity. There appears to be no major increase from baseline but he would like to trial baclofen.   8. Diabetes Mellitus: Continue Amaryl and actos. Monitor BS with ac/hs cbg checks and use SSI for elevated BS.   Low cbg again this am, but ok to high the rest of the day yesterday.  Encouraged hs snack.  Watch afternoon CBG's    LOS (Days) 4  [name],[name] 04/10/2011, 7:22 AM     A FACE TO FACE EVALUATION WAS PERFORMED

## 2011-04-10 NOTE — Progress Notes (Signed)
Occupational Therapy Session Note  Patient Details  Name: Lance [name] MRN: [id number] Date of Birth: 1940/08/29  Today's Date: 04/10/2011 Time: 1400-1440 Time Calculation (min): 40 min  Precautions: Precautions Precautions: Fall Required Braces or Orthoses: No  Short Term Goals: OT Short Term Goal 1: Patient will perform UB dressing sitting EOB with supervision (LTG) OT Short Term Goal 2: Patient will bathe 10/10 body parts with moderate assistance OT Short Term Goal 3: Patient will perform toilet transfer with maximal assistance OT Short Term Goal 4: Patient will perform LB dressing with moderate assistance  Skilled Therapeutic Interventions/Progress Updates:    Skilled intervention focusing on toilet transfer<-> scooter to drop arm BSC and toileting (clothes management); performed transfer through scoot/squat-no slide board. Patient able to perform transfer to drop arm with minimal assist and from drop arm with moderate assist. Patient did need assist to adjust clothing prior too and post transfer. Patient with good problem solving skills and able to read body and adhere to energy conservation techniques. Gave handout on energy conservation for daily routines and daily living.   Pain  No complaints of pain   Therapy/Group: Individual Therapy  Mikaylee Arseneau 04/10/2011, 2:44 PM

## 2011-04-11 LAB — GLUCOSE, CAPILLARY
Glucose-Capillary: 85 mg/dL (ref 70–99)
Glucose-Capillary: 144 mg/dL — ABNORMAL HIGH (ref 70–99)
Glucose-Capillary: 113 mg/dL — ABNORMAL HIGH (ref 70–99)

## 2011-04-11 NOTE — Progress Notes (Signed)
Physical Therapy Note  Patient Details  Name: Lance [name] MRN: [id number] Date of Birth: May 08, 1940 Today's Date: 04/11/2011  Time: 1000-1045  Pain: Denies pain Precautions: Fall Risk, Contact Precautions  Pt's scooter from home is here, practiced transfers from bed to scooter with close S without use of sliding board, lateral scoot transfer level surface. Pt plans to have wife and son change out mattress at home to make the transfers level to decrease effort needed to get into bed. Practiced bed mobility min-A overall supine <-> sit with extra time and cues for technique and used leg loops. Supervision rolling to Right side, bringing lower extremities off edge of bed and then pushing up.  Patient using leg loops with S for cueing for technique and pt reports that he feels this helps a lot. Attempted parallel bars sit<->stand, but patient not yet strong enough to participate.  Standing frame x 8 minutes for endurance with trunk control and weight shifting.  Individual Treatment Session: 37'   Sylvester Harder 04/11/2011, 11:54 AM

## 2011-04-11 NOTE — Progress Notes (Signed)
Patient alert and oriented x 3- Patient uses call bell appropriately. Patient has foley cath intact, Patient given suppository at 1400 with no results at this time. Bowel sounds present. Patient reports having bowel movement once or twice a week at home. Patient last bowel movement on 12/6. Patient can be incontinent at times. Patient able to transfer from scooter to bed with min assist. Patient progressing with bed mobility. Patient dressing changed to sacrum- santyl applied to stage II. Patient Allevyn dressings to legs intact- changed yesterday. Patient appetite good. Patient has decreased sensation and weakness to BLE. Patient denied any pain this shift. No new complaints noted. Continue with plan of care.

## 2011-04-11 NOTE — Progress Notes (Signed)
Patient ID: Lance [name], male   [name]: 03-28-1941, 70 y.o.   MRN: [id number] Patient ID: Lance [name], male   [name]: 1940-09-15, 70 y.o.   MRN: [id number] Patient ID: Lance [name], male   [name]: 28-Jul-1940, 70 y.o.   MRN: [id number] Patient ID: Lance [name], male   [name]: March 28, 1941, 70 y.o.   MRN: [id number] Patient ID: Lance [name], male   [name]: [birth year], 70 y.o.   MRN: [id number] Subjective/Complaints: Review of Systems  Gastrointestinal: Positive for constipation.  Musculoskeletal: Positive for joint pain.  Neurological: Positive for tingling.  All other systems reviewed and are negative.   spasticity near baseline  Objective: Vital Signs: Blood pressure 130/73, pulse 88, temperature 97.6 F (36.4 C), temperature source Oral, resp. rate 18, weight 106.686 kg (235 lb 3.2 oz), SpO2 99.00%. No results found.  Basename 04/09/11 0645  WBC 8.2  HGB 8.5*  HCT 26.1*  PLT 381   No results found for this basename: NA:2,K:2,CL:2,CO2:2,GLUCOSE:2,BUN:2,CREATININE:2,CALCIUM:2 in the last 72 hours CBG (last 3)   Basename 04/11/11 0734 04/10/11 2109 04/10/11 1647  GLUCAP 85 131* 154*    Wt Readings from Last 3 Encounters:  04/08/11 106.686 kg (235 lb 3.2 oz)  03/30/11 92.987 kg (205 lb)  03/24/11 92.987 kg (205 lb)    Physical Exam:  General appearance: alert and no distress Head: Normocephalic, without obvious abnormality, atraumatic Eyes: conjunctivae/corneas clear. PERRL, EOM's intact. Fundi benign. Ears: normal TM's and external ear canals both ears Nose: Nares normal. Septum midline. Mucosa normal. No drainage or sinus tenderness. Throat: lips, mucosa, and tongue normal; teeth and gums normal Neck: no adenopathy, no carotid bruit, no JVD, supple, symmetrical, trachea midline and thyroid not enlarged, symmetric, no tenderness/mass/nodules Back: symmetric, no curvature. ROM normal. No CVA tenderness. Resp: clear to auscultation bilaterally Cardio:  regular rate and rhythm, S1, S2 normal, no murmur, click, rub or gallop GI: soft, non-tender; bowel sounds normal; no masses,  no organomegaly Extremities: extremities normal, atraumatic, no cyanosis or edema  Pulses: 2+ and symmetric Skin: right calf breakdown.  bilateral heel blisters right greater than left , sacrum clean with slight fibronecrotic tissue and little drainage Neurologic: hyperreflexic, RLE 1-2/5  LLE 2/5. RUE 3-4/5, LUE 4/5.  Sensory 1+/2 especially in lower extremities.  Positive clonus.  Incision/Wound: see above   Assessment/Plan: 1. Functional deficits secondary to MS with pseudoexacerbation related tor retroperitoneal bleed which require 3+ hours per day of interdisciplinary therapy in a comprehensive inpatient rehab setting. Physiatrist is providing close team supervision and 24 hour management of active medical problems listed below. Physiatrist and rehab team continue to assess barriers to discharge/monitor patient progress toward functional and medical goals.  Working on Leisure centre manager with therapies.  Mobility: Bed Mobility Bed Mobility: Yes Rolling Right: 3: Mod assist Supine to Sit: 2: Max assist Transfers Transfers: Yes Lateral/Scoot Transfers: 1: +2 Total assist;Patient percentage (comment) (pt = 40%) Ambulation/Gait Stairs: No Wheelchair Mobility Wheelchair Mobility: Yes Wheelchair Assistance: 5: Careers information officer: Both upper extremities Wheelchair Parts Management: Needs assistance Distance: 120 ADL:   Cognition: Cognition Overall Cognitive Status: Appears within functional limits for tasks assessed Arousal/Alertness: Awake/alert Orientation Level: Oriented X4 Safety/Judgment: Appears intact Cognition Arousal/Alertness: Awake/alert Orientation Level: Oriented X4   Medical Problem List and Plan:  1. DVT Prophylaxis/Anticoagulation: Pharmaceutical: Lovenox. Patients hgb stable and will initiate lovenox    2. Pain Management: Continue prn vicodin. No complaints at present.  3. Mood: No active issues.  4. UTI: Continue septra  DS for 10 total day. Foley  5. ABLA: iron supplement. Following HGB. Recheck monday  6. Sacral decub and bilateral heel blisters: Continue air mattress overlay. Order additional prevalon boot and use both when in bed. Allevyn drsg.  I decided to add collagenase for sacral wound.   7. MS with spasticity: No meds currently. Will look at pharmaceutical mgt based on tolerance of activity. There appears to be no major increase from baseline but he would like to try baclofen.  Titrate to effect and tolerance.  8. Diabetes Mellitus: Continue Amaryl and actos. Monitor BS with ac/hs cbg checks and use SSI for elevated BS.   Lower AM cbgs.  Daytime cbg's better yesterday.    LOS (Days) 5  [name],Lance T 04/11/2011, 7:54 AM     A FACE TO FACE EVALUATION WAS PERFORMED

## 2011-04-12 LAB — GLUCOSE, CAPILLARY
Glucose-Capillary: 89 mg/dL (ref 70–99)
Glucose-Capillary: 153 mg/dL — ABNORMAL HIGH (ref 70–99)

## 2011-04-12 MED ORDER — BACLOFEN 10 MG PO TABS
10.0000 mg | ORAL_TABLET | Freq: Three times a day (TID) | ORAL | Status: DC
  Administered 2011-04-12 – 2011-04-24 (×36): 10 mg via ORAL
  Filled 2011-04-12 (×41): qty 1

## 2011-04-12 NOTE — Progress Notes (Signed)
Patient alert and oriented x 3- Patient uses call bell appropriately. Patient has foley intact. Patient has small incontinent episode of stool in brief this morning. Patient able to transfer from scooter to bed with min assist. Patient dressing changed to sacrum- santyl applied to stage II. Patient Allevyn dressings to legs intact- changed yesterday. Allevyn dressing to right back of thigh changed-blister appeals to have healed. Patient appetite good. Patient has decreased sensation and weakness to BLE. Patient denied any pain this shift. No new complaints noted. Continue with plan of care.

## 2011-04-12 NOTE — Progress Notes (Signed)
Occupational Therapy Note  Patient Details  Name: Lance [name] MRN: [id number] Date of Birth: 12-31-1940 Today's Date: 04/12/2011  Time in / out: 10:30 - 11:15  Total time: 45 minutes  Pain: 0/10; no report of pain   Skilled Intervention: ADL retraining at w/c level (scooter) sink side with emphasis on safety, skilled use and management of DME, bed mobility, transfers, and energy conservation.   Patient required max assist with deflating air mattress, positioning scooter, managing IUD and bag, and performance of lower body dressing.   Patient with 30 years experience managing symptoms of MS declined shower due to limited time and elected to allow setup assist at sink side for unassisted bathing at his convenience.   Individual session  2nd Session Time in / out: 2:30 - 4:00 pm Total time: 90 minutes  Skilled Intervention: Therapeutic exercise, 90 minutes total time, with emphasis on seated upper body strengthening (PRE) in ortho gym for 45 minutes using universal gym station: 12 reps X 2 sets @ 20 # of lat pull downs, 12/2 @ 20 # of behind the neck pull downs, 10/2 @ 5 lbs of close-grip upright rows (w/OT providing support of trunk to prevent excessive forward leaning).  Patient was most challenge by upright rows but was motivated to progress through all exercises, taking appropriate rest breaks, as needed.   UE exercises was followed by 10 minutes of NuStep, level 2, position 13 at arms, and 15 at legs, pace of 30-50 WATTS.   HR prior to NuStep was 97 BPM, after 107 BPM, recovering to 94-96 BPM 3 minutes after exercise.   Patient required steadying assist with manual contact of right knee during NuStep d/t hip adductor weakness.   Patient required mod assist with all transfer for positioning of both legs and steadying assist.  Patient verbalized satisfaction with UE exercises and states he completes triceps extensions exercises in his room using dumbells brought in from  home.  Individual session   [name] 04/12/2011, 5:01 PM

## 2011-04-12 NOTE — Progress Notes (Signed)
Physical Therapy Note  Patient Details  Name: Lance [name] MRN: [id number] Date of Birth: January 22, 1941 Today's Date: 04/12/2011  1300-1355 (55 minutes) individual therapy Pain- no complaint of pain Focus of treatment: Neuro reed bilateral LEs in gravity eliminated position Transfers- scooter to mat/mat to scooter- level lateral scoot close supervision Bilateral hip flexion-extension, knee flexion-extension, hip abduction using Suspension grid (gravity eliminated)- Pt able to perform Left hip flexion-extension approximately 50% range actively.]/ Right 10%; hip abduction 10% left 0% right  Chivas Notz,JIM 04/12/2011, 3:26 PM

## 2011-04-12 NOTE — Progress Notes (Signed)
Patient ID: AUGUSTE RAPONI, male   [name]: Apr 28, 1941, 70 y.o.   MRN: [id number] Patient ID: JACERE TORTORELLA, male   [name]: Aug 12, 1940, 70 y.o.   MRN: [id number] Patient ID: AVYAAN SAUNDER, male   [name]: [birth year], 70 y.o.   MRN: [id number] Patient ID: WILKINS MANCE, male   [name]: 1940/06/11, 70 y.o.   MRN: [id number] Patient ID: YEDIEL ABOOD, male   [name]: April 23, 1941, 70 y.o.   MRN: [id number] Patient ID: DAVYN MEHRA, male   [name]: [birth year], 71 y.o.   MRN: [id number] Subjective/Complaints: Review of Systems  Gastrointestinal: Positive for constipation.  Musculoskeletal: Positive for joint pain.  Neurological: Positive for tingling.  All other systems reviewed and are negative.  a little concerned over lower bp this am.  No sx  Objective: Vital Signs: Blood pressure 123/44, pulse 71, temperature 98.5 F (36.9 C), temperature source Oral, resp. rate 19, weight 106.686 kg (235 lb 3.2 oz), SpO2 95.00%. No results found.  Basename 04/09/11 0645  WBC 8.2  HGB 8.5*  HCT 26.1*  PLT 381   No results found for this basename: NA:2,K:2,CL:2,CO2:2,GLUCOSE:2,BUN:2,CREATININE:2,CALCIUM:2 in the last 72 hours CBG (last 3)   Basename 04/11/11 2057 04/11/11 1656 04/11/11 1207  GLUCAP 113* 140* 144*    Wt Readings from Last 3 Encounters:  04/08/11 106.686 kg (235 lb 3.2 oz)  03/30/11 92.987 kg (205 lb)  03/24/11 92.987 kg (205 lb)    Physical Exam:  General appearance: alert and no distress Head: Normocephalic, without obvious abnormality, atraumatic Eyes: conjunctivae/corneas clear. PERRL, EOM's intact. Fundi benign. Ears: normal TM's and external ear canals both ears Nose: Nares normal. Septum midline. Mucosa normal. No drainage or sinus tenderness. Throat: lips, mucosa, and tongue normal; teeth and gums normal Neck: no adenopathy, no carotid bruit, no JVD, supple, symmetrical, trachea midline and thyroid not enlarged, symmetric, no tenderness/mass/nodules Back:  symmetric, no curvature. ROM normal. No CVA tenderness. Resp: clear to auscultation bilaterally Cardio: regular rate and rhythm, S1, S2 normal, no murmur, click, rub or gallop GI: soft, non-tender; bowel sounds normal; no masses,  no organomegaly Extremities: extremities normal, atraumatic, no cyanosis or edema  Pulses: 2+ and symmetric Skin: right calf breakdown.  bilateral heel blisters right greater than left , sacrum clean with slight fibronecrotic tissue and little drainage Neurologic: hyperreflexic, RLE 1-2/5  LLE 2/5. RUE 3-4/5, LUE 4/5.  Sensory 1+/2 especially in lower extremities.  Positive clonus.  Incision/Wound: see above   Assessment/Plan: 1. Functional deficits secondary to MS with pseudoexacerbation related tor retroperitoneal bleed which require 3+ hours per day of interdisciplinary therapy in a comprehensive inpatient rehab setting. Physiatrist is providing close team supervision and 24 hour management of active medical problems listed below. Physiatrist and rehab team continue to assess barriers to discharge/monitor patient progress toward functional and medical goals.  Working on Leisure centre manager with therapies.  Mobility: Bed Mobility Bed Mobility: Yes Rolling Right: 3: Mod assist Supine to Sit: 2: Max assist Transfers Transfers: Yes Lateral/Scoot Transfers: 1: +2 Total assist;Patient percentage (comment) (pt = 40%) Ambulation/Gait Stairs: No Wheelchair Mobility Wheelchair Mobility: Yes Wheelchair Assistance: 5: Careers information officer: Both upper extremities Wheelchair Parts Management: Needs assistance Distance: 120 ADL:   Cognition: Cognition Overall Cognitive Status: Appears within functional limits for tasks assessed Arousal/Alertness: Awake/alert Orientation Level: Oriented X4 Safety/Judgment: Appears intact Cognition Arousal/Alertness: Awake/alert Orientation Level: Oriented X4   Medical Problem List and Plan:  1.  DVT Prophylaxis/Anticoagulation: Pharmaceutical: Lovenox. Patients hgb stable and will initiate lovenox  2. Pain Management: Continue prn vicodin. No complaints at present.  3. Mood: No active issues.  4. UTI: Continue septra DS for 10 total day. Foley  5. ABLA: iron supplement. Following HGB. Recheck Monday. Trending up/stable.  6. Sacral decub and bilateral heel blisters: Continue air mattress overlay. Order additional prevalon boot and use both when in bed. Allevyn drsg.  I decided to add collagenase for sacral wound.  Consider hydrotherapy  7. MS with spasticity: No meds currently. Will look at pharmaceutical mgt based on tolerance of activity. Baclofen trial.  Increase to 10mg  today although i'm not sure his BP will tolerate it.  8. Diabetes Mellitus: Continue Amaryl and actos. Monitor BS with ac/hs cbg checks and use SSI for elevated BS.   Lower AM cbgs.  Daytime cbg's better yesterday.    LOS (Days) 6  Rubye Strohmeyer T 04/12/2011, 6:41 AM     A FACE TO FACE EVALUATION WAS PERFORMED

## 2011-04-13 LAB — BASIC METABOLIC PANEL
BUN: 47 mg/dL — ABNORMAL HIGH (ref 6–23)
Chloride: 110 mEq/L (ref 96–112)
GFR calc non Af Amer: 25 mL/min — ABNORMAL LOW (ref >90)
Glucose, Bld: 76 mg/dL (ref 70–99)
Potassium: 5.1 mEq/L (ref 3.5–5.1)

## 2011-04-13 LAB — DIFFERENTIAL
Eosinophils Absolute: 0 10*3/uL (ref 0.0–0.7)
Lymphs Abs: 1.9 10*3/uL (ref 0.7–4.0)
Monocytes Absolute: 0.8 10*3/uL (ref 0.1–1.0)
Monocytes Relative: 11 % (ref 3–12)
Neutrophils Relative %: 60 % (ref 43–77)

## 2011-04-13 LAB — GLUCOSE, CAPILLARY
Glucose-Capillary: 68 mg/dL — ABNORMAL LOW (ref 70–99)
Glucose-Capillary: 182 mg/dL — ABNORMAL HIGH (ref 70–99)
Glucose-Capillary: 110 mg/dL — ABNORMAL HIGH (ref 70–99)
Glucose-Capillary: 142 mg/dL — ABNORMAL HIGH (ref 70–99)

## 2011-04-13 LAB — CBC
HCT: 25.4 % — ABNORMAL LOW (ref 39.0–52.0)
Hemoglobin: 8.5 g/dL — ABNORMAL LOW (ref 13.0–17.0)
MCH: 31.6 pg (ref 26.0–34.0)
RBC: 2.69 MIL/uL — ABNORMAL LOW (ref 4.22–5.81)

## 2011-04-13 NOTE — Progress Notes (Signed)
Patient tolerating therapy . No complaint of pain  reviewed new order for wound care to sacral wound PT hydrotherapy . Patient aware and verbalized understanding of care. Foley intact W.N.L.  Mod assist slide board transfer required . Continue with plan of care .                                                                                           Mliss Sax

## 2011-04-13 NOTE — Progress Notes (Signed)
Physical Therapy Weekly Progress Note & Treatment Note  Patient Details  Name: Lance [name] MRN: [id number] Date of Birth: 11-20-1940  Today's Date: 04/13/2011  Treatment #1: individual therapy Time: 1300-1345 Time Calculation (min): 45 min Denies pain. Treatment session focused on functional transfers to/from scooter with overall minA, cueing for safety. Bed mobility retraining with supervision, and extra time. Worked on gravity eliminated RLE active and active assisted ROM in sidelying with board underneath. Pt able to initiate active assisted ROM in this position with hip flexion and extension.   Treatment #2: individual therapy Time: 1540-1610 30 minutes Pt had finished wound care treatment this afternoon and was in some discomfort "I can feel they were doing something down there." Passive stretching in supine to bilateral lower extremities for all joints to increase flexibility, work through spasticity, and increase available ROM.    Patient has met 4 of 4 short term goals.  Pt initially requiring maxA overall for mobility and progressing to steadyA/close S. Introduced leg loops to pt for bed mobility and coordinating to have a pair made for him. Pt using slideboard for car transfers at this time and occasionally for uneven surfaces though progressing away from that. Pt is educated and practicing pressure relief independently and discussed the importance of this with the use of his scooter which does not have a pressure relieving cushion. Pt wants to continue to use scooter due to ease of transport stating he already tried a power chair and did not want to go that route. Heavy emphasis on energy conservation techniques and pt very aware and receptive to this education. Will need to complete family education prior to d/c.   Patient continues to demonstrate the following deficits: abnormal tone, decreased strength, decrease motor control, decreased activity tolerance, decreased  postural control and therefore will continue to benefit from skilled PT intervention to enhance overall performance with activity tolerance, balance, postural control and ability to compensate for deficits.  Patient progressing toward long term goals..  Continue plan of care.  PT Short Term Goals PT Short Term Goal 1: Pt will be able to perform bed mobility with minA -Met PT Short Term Goal 2: Pt will be able to perform functional transfers with modA -Met PT Short Term Goal 3: Pt will be able to propel w/c x 150 with supervision in controlled environment for UE strengthening -Met PT Short Term Goal 4: Pt will be able to maintain dynamic sitting balance with steady A during functional tasks -Met       Therapy/Group: Individual Therapy  Canary Brim [facility] 04/13/2011, 4:28 PM

## 2011-04-13 NOTE — Progress Notes (Signed)
Subjective/Complaints: Review of Systems  Gastrointestinal: Positive for constipation.  Musculoskeletal: Positive for joint pain.  Neurological: Positive for tingling.  All other systems reviewed and are negative.  no more problems with bp yesterday.  No real change in spasticity.  Objective: Vital Signs: Blood pressure 115/54, pulse 80, temperature 98.1 F (36.7 C), temperature source Oral, resp. rate 17, weight 106.686 kg (235 lb 3.2 oz), SpO2 96.00%. No results found.  Basename 04/13/11 0600  WBC 6.8  HGB 8.5*  HCT 25.4*  PLT 288   No results found for this basename: NA:2,K:2,CL:2,CO2:2,GLUCOSE:2,BUN:2,CREATININE:2,CALCIUM:2 in the last 72 hours CBG (last 3)   Basename 04/13/11 0715 04/12/11 2057 04/12/11 1648  GLUCAP 68* 136* 153*    Wt Readings from Last 3 Encounters:  04/08/11 106.686 kg (235 lb 3.2 oz)  03/30/11 92.987 kg (205 lb)  03/24/11 92.987 kg (205 lb)    Physical Exam:  General appearance: alert and no distress Head: Normocephalic, without obvious abnormality, atraumatic Eyes: conjunctivae/corneas clear. PERRL, EOM's intact. Fundi benign. Ears: normal TM's and external ear canals both ears Nose: Nares normal. Septum midline. Mucosa normal. No drainage or sinus tenderness. Throat: lips, mucosa, and tongue normal; teeth and gums normal Neck: no adenopathy, no carotid bruit, no JVD, supple, symmetrical, trachea midline and thyroid not enlarged, symmetric, no tenderness/mass/nodules Back: symmetric, no curvature. ROM normal. No CVA tenderness. Resp: clear to auscultation bilaterally Cardio: regular rate and rhythm, S1, S2 normal, no murmur, click, rub or gallop GI: soft, non-tender; bowel sounds normal; no masses,  no organomegaly Extremities: extremities normal, atraumatic, no cyanosis or edema  Pulses: 2+ and symmetric Skin: right calf breakdown.  bilateral heel blisters right greater than left , sacrum clean with slight fibronecrotic tissue and little  drainage unchagend. Neurologic: hyperreflexic, RLE 1-2/5  LLE 2/5. RUE 3-4/5, LUE 4/5.  Sensory 1+/2 especially in lower extremities.  Positive clonus. Stable exam. Incision/Wound: see above   Assessment/Plan: 1. Functional deficits secondary to MS with pseudoexacerbation related tor retroperitoneal bleed which require 3+ hours per day of interdisciplinary therapy in a comprehensive inpatient rehab setting. Physiatrist is providing close team supervision and 24 hour management of active medical problems listed below. Physiatrist and rehab team continue to assess barriers to discharge/monitor patient progress toward functional and medical goals.  Working on Leisure centre manager with therapies.  Mobility: Bed Mobility Bed Mobility: Yes Rolling Right: 3: Mod assist Supine to Sit: 2: Max assist Transfers Transfers: Yes Lateral/Scoot Transfers: 1: +2 Total assist;Patient percentage (comment) (pt = 40%) Ambulation/Gait Stairs: No Wheelchair Mobility Wheelchair Mobility: Yes Wheelchair Assistance: 5: Careers information officer: Both upper extremities Wheelchair Parts Management: Needs assistance Distance: 120 ADL:   Cognition: Cognition Overall Cognitive Status: Appears within functional limits for tasks assessed Arousal/Alertness: Awake/alert Orientation Level: Oriented X4 Safety/Judgment: Appears intact Cognition Arousal/Alertness: Awake/alert Orientation Level: Oriented X4   Medical Problem List and Plan:  1. DVT Prophylaxis/Anticoagulation: Pharmaceutical: Lovenox. Patients hgb stable and will initiate lovenox   2. Pain Management: Continue prn vicodin. No complaints at present.  3. Mood: No active issues.  4. UTI: Continue septra DS for 10 total day. Foley  5. ABLA: iron supplement. Following HGB. Trending up/stable with today's labs.  6. Sacral decub and bilateral heel blisters: Continue air mattress overlay. Order additional prevalon boot and use  both when in bed.  Will try hydrotherapy to sacrum with collagenase.  -diet  7. MS with spasticity: No meds currently. Will look at pharmaceutical mgt based on tolerance of activity. Baclofen increased  10mg  tid.  8. Diabetes Mellitus: Continue Amaryl and actos. Monitor BS with ac/hs cbg checks and use SSI for elevated BS.   CBG's appear to be stabilizing.    LOS (Days) 7  [name],Lance [name] 04/13/2011, 7:25 AM     A FACE TO FACE EVALUATION WAS PERFORMED

## 2011-04-13 NOTE — Progress Notes (Signed)
Occupational Therapy Session Note  Patient Details  Name: Lance [name] MRN: [id number] Date of Birth: 1941-03-22  Today's Date: 04/13/2011 Time: 0730-0830 Time Calculation (min): 60 min  Precautions: Precautions Precautions: Fall Required Braces or Orthoses: No Restrictions Weight Bearing Restrictions: No  Short Term Goals: OT Short Term Goal 1: Patient will perform UB dressing sitting EOB with supervision (LTG) OT Short Term Goal 2: Patient will bathe 10/10 body parts with moderate assistance OT Short Term Goal 3: Patient will perform toilet transfer with maximal assistance OT Short Term Goal 4: Patient will perform LB dressing with moderate assistance  Skilled Therapeutic Interventions/Progress Updates:    Treatment emphasis on ADL retraining. Patient found supine in bed upon arrival. Focused on bed mobility, donning pants at bed level, supine ->sit with HOB flat&bed rails up, edge of bed ->scooter transfer (scoot transfer; no slide board). Sat at sink in scooter for UB bathing and dressing & grooming tasks. Patient with no complaints of pain during session. Discussed a possible sink side bath or shower tomorrow.   Pain  No complaints of pain  Therapy/Group: Individual Therapy  Miria Cappelli 04/13/2011, 8:48 AM

## 2011-04-13 NOTE — Progress Notes (Signed)
Physical Therapy Note  Patient Details  Name: Lance [name] MRN: [id number] Date of Birth: 01/20/1941 Today's Date: 04/13/2011  Individual therapy 830-925 (55 min) Denies pain. Pt reports discussing home modifications with wife over the weekend and wife is working on making the appropriate changes in regards to bed height specifically. Plan to measure car height as well. Simulated car transfer multiple repetitions with slideboard overall modA initially progressing to minA overall for transfer. Car transfer height slightly uneven (uphill going into car) in simulated car. Discussed technique and safety methods. Therapist positioning slideboard for pt each time. HR checked to monitor exertion, remained between 98-113 with activity, one jump to 120 with decrease within a few minutes of resting.    Canary Brim [facility] 04/13/2011, 9:23 AM

## 2011-04-13 NOTE — Progress Notes (Signed)
Physical Therapy Wound Treatment Patient Details  Name: Lance [name] MRN: [id number] Date of Birth: 02-Aug-1940  Today's Date: 04/13/2011 Time:  1400- 73    Patient Active Problem List  Diagnoses  . Nephrolithiasis  . Multiple sclerosis  . Diabetes mellitus  . UTI (lower urinary tract infection)  . Sacral decubitus ulcer  . Ulcer of ankle  . Anemia  . Acute renal failure (ARF)  . MS (multiple sclerosis) pseudoexacerbation  . Anemia associated with acute blood loss due to retro/intrapertitoneal hematomas   Past Medical History  Diagnosis Date  . Hypertension   . Blood transfusion   . Neuralgia neuritis, sciatic nerve     MS for 30 years  . Diabetes mellitus   . PONV (postoperative nausea and vomiting)    Past Surgical History  Procedure Date  . Tonsillectomy   . Finger surgery 2004    Subjective  Subjective: Lance [name] states that he had hydrotherapy for a previous ulcer about 10 years ago. Patient and Family Stated Goals: To heal wound Date of Onset:  (Several weeks ago) Prior Treatments: dressing changes  Pain Score:    Objective  Cognition: WFL Communication: WFL Mobility: Impaired:  ROM: Impaired:  Sensation: diminished  Protective sensation (10g): not tested Signs of venous insufficiency:N/A Signs of arterial insufficiency: N/A  Wound Assessment  Pressure Ulcer 03/30/11 Stage II -  Partial thickness loss of dermis presenting as a shallow open ulcer with a red, pink wound bed without slough. (Active)  State of Healing Early/partial granulation 04/13/2011  2:36 PM  Site / Wound Assessment Pink;Red;Yellow 04/13/2011  2:36 PM  % Wound base Red or Granulating 50% 04/13/2011  2:36 PM  % Wound base Yellow 50% 04/13/2011  2:36 PM  % Wound base Black 0% 04/12/2011  8:30 AM  Peri-wound Assessment Intact 04/13/2011  2:36 PM  Wound Length (cm) 9 cm 04/13/2011  2:36 PM  Wound Width (cm) 4 cm 04/13/2011  2:36 PM  Wound Depth (cm) 0.1 cm 04/13/2011  2:36 PM    Drainage Amount Minimal 04/13/2011  2:36 PM  Drainage Description Serous 04/13/2011  2:36 PM  Treatment Hydrotherapy (Pulse lavage) 04/13/2011  2:36 PM  Dressing Type Moist to dry;Foam 04/13/2011  2:36 PM  Dressing Changed 04/13/2011  2:36 PM     Hydrotherapy Pulsed lavage therapy - wound location: rt. buttock, gluteal fold Pulsed Lavage with Suction (psi):  (8) Pulsed Lavage with Suction - Normal Saline Used: 1000 mL Pulsed Lavage Tip: Tip with splash shield   Wound Assessment and Plan  Wound Therapy - Assess/Plan/Recommendations Wound Therapy - Clinical Statement: Lance [name]. with wound on rt. buttock and gluteal fold that is partially covered with yellow slough.  Can benefit from hydrotherapy to decrease slough and promote healing. Factors Delaying/Impairing Wound Healing: Altered sensation;Multiple medical problems;Immobility;Incontinence Hydrotherapy Plan: Debridement;Dressing change;Patient/family education;Pulsatile lavage with suction Wound Therapy - Frequency: 6X / week Wound Therapy - Follow Up Recommendations: Home health RN  Wound Therapy Goals- Improve the function of patient's integumentary system by progressing the wound(s) through the phases of wound healing (inflammation - proliferation - remodeling) by: Decrease Necrotic Tissue to: 25% Decrease Necrotic Tissue - Progress: Not met Increase Granulation Tissue to: 75% Increase Granulation Tissue - Progress: Not met Patient/Family will be able to : verbalize positioning Patient/Family Instruction Goal - Progress: Not met Goals/treatment plan/discharge plan were made with and agreed upon by patient/family: Yes Time For Goal Achievement: 7 days Wound Therapy - Potential for Goals: Good  Goals will be updated until  maximal potential achieved or discharge criteria met.  Discharge criteria: when goals achieved, discharge from hospital, MD decision/surgical intervention, no progress towards goals, refusal/missing three consecutive  treatments without notification or medical reason.  Lance [name] 04/13/2011, 2:46 PM  [name] Lance [name] [phone]

## 2011-04-14 LAB — GLUCOSE, CAPILLARY
Glucose-Capillary: 164 mg/dL — ABNORMAL HIGH (ref 70–99)
Glucose-Capillary: 193 mg/dL — ABNORMAL HIGH (ref 70–99)

## 2011-04-14 MED ORDER — NYSTATIN 100000 UNIT/GM EX POWD
Freq: Three times a day (TID) | CUTANEOUS | Status: DC
  Administered 2011-04-14 – 2011-04-24 (×26): via TOPICAL
  Filled 2011-04-14 (×2): qty 15

## 2011-04-14 NOTE — Progress Notes (Signed)
Physical Therapy Wound Treatment Patient Details  Name: Lance [name] MRN: [id number] Date of Birth: Jun 03, 1940  Today's Date: 04/14/2011 Time: [id number] Time Calculation (min): 26 min  Subjective  Subjective: "Are you done?"pt asked.  Pain Score:    Wound Assessment  Pressure Ulcer 03/30/11 Stage II -  Partial thickness loss of dermis presenting as a shallow open ulcer with a red, pink wound bed without slough. (Active)  State of Healing Early/partial granulation 04/14/2011  1:46 PM  Site / Wound Assessment Pink;Yellow 04/14/2011  1:46 PM  % Wound base Red or Granulating 25% 04/14/2011  1:46 PM  % Wound base Yellow 75% 04/14/2011  1:46 PM  % Wound base Black 0% 04/12/2011  8:30 AM  % Wound base Other (Comment) 100% 03/31/2011  8:40 AM  Peri-wound Assessment Induration 04/14/2011  1:46 PM  Wound Length (cm) 9 cm 04/13/2011  2:36 PM  Wound Width (cm) 4 cm 04/13/2011  2:36 PM  Wound Depth (cm) 0.1 cm 04/13/2011  2:36 PM  Margins Epibole (rolled edges) 04/06/2011  4:30 PM  Drainage Amount Minimal 04/14/2011  1:46 PM  Drainage Description Serosanguineous 04/14/2011  1:46 PM  Treatment Hydrotherapy (Pulse lavage);Debridement (Selective) 04/14/2011  1:46 PM  Dressing Type Moist to dry;Foam 04/14/2011  1:46 PM  Dressing Changed 04/14/2011  1:46 PM     Hydrotherapy Pulsed lavage therapy - wound location: rt. buttock, gluteal fold Pulsed Lavage with Suction (psi):  (4-12 psi) Pulsed Lavage with Suction - Normal Saline Used: 1000 mL Pulsed Lavage Tip: Tip with splash shield Selective Debridement Selective Debridement - Location: gluteal fold Selective Debridement - Tools Used: Forceps;Scissors;Other (comment) (4x4's) Selective Debridement - Tissue Removed: yellow slough   Wound Assessment and Plan  Wound Therapy - Assess/Plan/Recommendations Wound Therapy - Clinical Statement: Yellow slough showing some loosening. Wound Therapy - Follow Up Recommendations: Home health  RN  Wound Therapy Goals- Improve the function of patient's integumentary system by progressing the wound(s) through the phases of wound healing (inflammation - proliferation - remodeling) by: Decrease Necrotic Tissue - Progress: Not met Increase Granulation Tissue - Progress: Not met Patient/Family Instruction Goal - Progress: Progressing toward goal  Goals will be updated until maximal potential achieved or discharge criteria met.  Discharge criteria: when goals achieved, discharge from hospital, MD decision/surgical intervention, no progress towards goals, refusal/missing three consecutive treatments without notification or medical reason.  Syreeta Figler 04/14/2011, 1:50 PM  [place]

## 2011-04-14 NOTE — Progress Notes (Signed)
Physical Therapy Note  Patient Details  Name: Lance [name] MRN: [id number] Date of Birth: May 23, 1940 Today's Date: 04/14/2011  13:45- 14:15  Individual therapy no pain. Session focused on passive stretching to bil. Hip flexors, ER, hamstrings and heel cords   Erle Crocker 04/14/2011, 4:49 PM

## 2011-04-14 NOTE — Patient Care Conference (Signed)
Inpatient RehabilitationTeam Conference Note Date: 04/14/2011   Time: 1:50 PM    Patient Name: Lance [name]      Medical Record Number: [id number]  Date of Birth: 1941-01-23 Sex: Male         Room/Bed: 4007/4007-01 Payor Info: Payor: MEDICARE  Plan: MEDICARE PART A AND B  Product Type: *No Product type*     Admitting Diagnosis: MS DECONDITIONED  Admit Date/Time:  04/06/2011  4:07 PM Admission Comments: No comment available   Primary Diagnosis:  <principal problem not specified> Principal Problem: <principal problem not specified>  Patient Active Problem List  Diagnoses Date Noted  . MS (multiple sclerosis) pseudoexacerbation 04/07/2011  . Anemia associated with acute blood loss due to retro/intrapertitoneal hematomas 04/07/2011  . Acute renal failure (ARF) 04/02/2011    Class: Acute  . Multiple sclerosis 03/30/2011  . Diabetes mellitus 03/30/2011  . UTI (lower urinary tract infection) 03/30/2011  . Sacral decubitus ulcer 03/30/2011  . Ulcer of ankle 03/30/2011  . Anemia 03/30/2011  . Nephrolithiasis 03/24/2011    Expected Discharge Date: Expected Discharge Date: 04/24/11  Team Members Present: Physician: Dr. Alger Simons Case Manager Present: Lutricia Feil, RN Social Worker Present: Lennart Pall, LCSW OT Present: Chrys Racer, OT RN Present: Altamese Dilling    Current Status/Progress Goal Weekly Team Focus  Medical   Diabetes has been a bit difficult to control but showing improvement. Spasticity management issue. Sacral wound he may remains an issue  Reduce tone to improve functional mobility  Baclofen trial. Hydrotherapy trial   Bowel/Bladder     See documentation section Team Conf 04/13/11   See documentation 04/13/11     Swallow/Nutrition/ Hydration             ADL's   Supervision for UB ADLs, Max assit for LB ADLs  Overall Supervision  ADL retraining, transfers, and sit to stands   Mobility   S w/c mobility x 150'  S overall   functional transfers,  improving activity tolerance, strengthening   Communication             Safety/Cognition/ Behavioral Observations            Pain     See nursing documentation 04/13/11        Skin     See nursing documentation 04/13/11           *See Interdisciplinary Assessment and Plan and progress notes for long and short-term goals  Barriers to Discharge: Spasticity in premorbid neurological deficits in the setting of weekWeakness    Possible Resolutions to Barriers:  Endurance stamina and specificity management with therapies and 3 M.D. interventions    Discharge Planning/Teaching Needs:  Home with wife and handicapped son - wife works f/t -       Team Discussion: Baclofen. Hydrotx to bottom wound. ? D/c foley & resume home cath schedule-MD to discuss w/ pt.  Plan [facility] RN.   Revisions to Treatment Plan: none    Continued Need for Acute Rehabilitation Level of Care: The patient requires daily medical management by a physician with specialized training in physical medicine and rehabilitation for the following conditions: Daily direction of a multidisciplinary physical rehabilitation program to ensure safe treatment while eliciting the highest outcome that is of practical value to the patient.: Yes Daily medical management of patient stability for increased activity during participation in an intensive rehabilitation regime.: Yes Daily analysis of laboratory values and/or radiology reports with any subsequent need for medication adjustment of medical intervention  for : Neurological problems;Post surgical problems  Theora Master 04/16/2011, 9:26 AM

## 2011-04-14 NOTE — Progress Notes (Signed)
Physical Therapy Note  Patient Details  Name: Lance [name] MRN: [id number] Date of Birth: 1940/11/19 Today's Date: 04/14/2011  Time In:  0950 Time Out:  2  Individual Session.  Patient denies pain.  ADL retraining at bed level with focus on lower body bathing and dressing (upper body bathing and dressing addressed in next OT session at sink level).  Treatment emphasis on bed mobility, encouraging independence, alternative positioning to increase independence, sitting balance, trunk control, endurance. Patient with redness in scrotal area -RN notified and stated she will follow up with MD to get order for skin care.    Quay Burow 04/14/2011, 11:13 AM

## 2011-04-14 NOTE — Progress Notes (Signed)
Patient ID: Lance [name], male   [name]: 1940/05/22, 70 y.o.   MRN: [id number]  Subjective/Complaints: Review of Systems  Gastrointestinal: Positive for constipation.  Musculoskeletal: Positive for joint pain.  Neurological: Positive for tingling.  All other systems reviewed and are negative.  a little sore from hydrotherapy yesterday.  Objective: Vital Signs: Blood pressure 157/82, pulse 79, temperature 98.5 F (36.9 C), temperature source Oral, resp. rate 18, height 6\' 4"  (1.93 m), weight 90.13 kg (198 lb 11.2 oz), SpO2 96.00%. No results found.  Basename 04/13/11 0600  WBC 6.8  HGB 8.5*  HCT 25.4*  PLT 288    Basename 04/13/11 0600  NA 139  K 5.1  CL 110  CO2 21  GLUCOSE 76  BUN 47*  CREATININE 2.49*  CALCIUM 9.3   CBG (last 3)   Basename 04/13/11 2032 04/13/11 1649 04/13/11 1107  GLUCAP 142* 110* 182*    Wt Readings from Last 3 Encounters:  04/14/11 90.13 kg (198 lb 11.2 oz)  03/30/11 92.987 kg (205 lb)  03/24/11 92.987 kg (205 lb)    Physical Exam:  General appearance: alert and no distress Head: Normocephalic, without obvious abnormality, atraumatic Eyes: conjunctivae/corneas clear. PERRL, EOM's intact. Fundi benign. Ears: normal TM's and external ear canals both ears Nose: Nares normal. Septum midline. Mucosa normal. No drainage or sinus tenderness. Throat: lips, mucosa, and tongue normal; teeth and gums normal Neck: no adenopathy, no carotid bruit, no JVD, supple, symmetrical, trachea midline and thyroid not enlarged, symmetric, no tenderness/mass/nodules Back: symmetric, no curvature. ROM normal. No CVA tenderness. Resp: clear to auscultation bilaterally Cardio: regular rate and rhythm, S1, S2 normal, no murmur, click, rub or gallop GI: soft, non-tender; bowel sounds normal; no masses,  no organomegaly Extremities: extremities normal, atraumatic, no cyanosis or edema  Pulses: 2+ and symmetric Skin: right calf breakdown.  bilateral heel blisters  right greater than left , sacrum clean with slight fibronecrotic tissue and little drainage unchagend. Neurologic: hyperreflexic, RLE 1-2/5  LLE 2/5. RUE 3-4/5, LUE 4/5.  Sensory 1+/2 especially in lower extremities.  Positive clonus. Stable exam. Incision/Wound: see above   Assessment/Plan: 1. Functional deficits secondary to MS with pseudoexacerbation related tor retroperitoneal bleed which require 3+ hours per day of interdisciplinary therapy in a comprehensive inpatient rehab setting. Physiatrist is providing close team supervision and 24 hour management of active medical problems listed below. Physiatrist and rehab team continue to assess barriers to discharge/monitor patient progress toward functional and medical goals.  Working on Leisure centre manager with therapies. Family working on home mods, etc.  Mobility: Bed Mobility Bed Mobility: Yes Rolling Right: 3: Mod assist Supine to Sit: 2: Max assist Transfers Transfers: Yes Lateral/Scoot Transfers: 4: Min assist Ambulation/Gait Stairs: No Architect: Yes Wheelchair Assistance: 6: Modified independent (Device/Increase time) Environmental health practitioner: Power Wheelchair Parts Management: Independent Distance: >200 ADL:   Cognition: Cognition Overall Cognitive Status: Appears within functional limits for tasks assessed Arousal/Alertness: Awake/alert Orientation Level: Oriented X4 Safety/Judgment: Appears intact Cognition Arousal/Alertness: Awake/alert Orientation Level: Oriented X4   Medical Problem List and Plan:  1. DVT Prophylaxis/Anticoagulation: Pharmaceutical: Lovenox. Patients hgb stable and will initiate lovenox   2. Pain Management: Continue prn vicodin. No complaints at present.  3. Mood: No active issues.  4. UTI: Continue septra DS for 10 total day. Foley  5. ABLA: iron supplement. Following HGB. Trending up/stable with today's labs.  6. Sacral decub and bilateral  heel blisters: Continue air mattress overlay. Order additional prevalon boot and use  both when in bed.    -hydrotherapy to sacrum with collagenase.  -diet enhancement  7. MS with spasticity: No meds currently. Will look at pharmaceutical mgt based on tolerance of activity. Baclofen increased 10mg  tid. Not sure i see a big difference at this point  8. Diabetes Mellitus: Continue Amaryl and actos. Monitor BS with ac/hs cbg checks and use SSI for elevated BS.   CBG's  stabilizing.    LOS (Days) 8  Terry Bolotin T 04/14/2011, 7:14 AM     A FACE TO FACE EVALUATION WAS PERFORMED

## 2011-04-14 NOTE — Progress Notes (Signed)
Occupational Therapy Session Note  Patient Details  Name: [name] MRN: [id number] Date of Birth: [birth year]  Today's Date: 04/14/2011 Time: [id number] Time Calculation (min): 44 min  Precautions: Precautions Precautions: Fall Required Braces or Orthoses: No Restrictions Weight Bearing Restrictions: No  Short Term Goals: OT Short Term Goal 1: Patient will perform UB dressing sitting EOB with supervision (LTG) OT Short Term Goal 2: Patient will bathe 10/10 body parts with moderate assistance OT Short Term Goal 3: Patient will perform toilet transfer with maximal assistance OT Short Term Goal 4: Patient will perform LB dressing with moderate assistance  Skilled Therapeutic Interventions/Progress Updates:    ADL retraining including bathing and dressing.  Pt completing LB bathing and dressing at bed level with OTR finishing up prior therapy session.  Pt directed therapist regarding bed set up and scooter set up in preparation for scoot transfer to power scooter.  Pt performed supine to sit with supervision and use of bed rails.  Pt directed therapist to stabilize power scooter prior to transfer.  Pt completed UB bathing and dressing seated at sink.  Pt boosted self in scooter to allow therapist to position cushion properly.  Focus on activity tolerance, bed mobility, energy conservation strategies, transfers, and safety awareness.      Therapy/Group: Individual Therapy  Leroy Libman 04/14/2011, 11:38 AM

## 2011-04-14 NOTE — Consult Note (Addendum)
Wound care follow-up: Sacrum with previous deep tissue injury, now unstageable 9X3 cm, refer to hydrotherapy notes for percentage of yellow slough.  Right buttock unstageable wound has continued to decrease in size.  Now .3X.3cm  receiving hydrotherapy which was ordered earlier by primary team, 90% red. 10% yellow, bleeds easily when touched.  Small green-yellow drainage without odor to sacrum and buttock. Hydrotherapy will not be available after discharge, but wound continues to improve and this will not be necessary much longer.  Left heel with previous stage 2 wound 3X3cm with dry dk red tightly adhered skin, no l;onger fluid-filled.  No odor or drainage.  Right heel dry ruptured skin peels off easily, revealing 100% pink dry healed skin. Plan:  Continue santyl to chemically debride nonviable tissue to buttocks.  Float heels to reduce pressure and protect from friction/shear with foam dressing. Pt remains on air mattress to reduce pressure.   Julien Girt, RN, MSN, [employer]  [phone]

## 2011-04-14 NOTE — Progress Notes (Signed)
Patient is alert and oriented x3. He has not complained of pain or discomfort this shift. Blood glucose levels have required sliding scale insulin coverage. See MAR. Dressing changes done by wound nurse this am. Contact isolation precautions observed. Foley cath in place draining yellow urine. Continue current plan of care.

## 2011-04-14 NOTE — Progress Notes (Signed)
Occupational Therapy Session Note  Patient Details  Name: REEGAN KOIS MRN: [id number] Date of Birth: August 21, 1940  Today's Date: 04/14/2011 Time: [id number] Time Calculation (min): 48 min  Precautions: Precautions Precautions: Fall Required Braces or Orthoses: No Restrictions Weight Bearing Restrictions: No  Short Term Goals: OT Short Term Goal 1: Patient will perform UB dressing sitting EOB with supervision (LTG) OT Short Term Goal 2: Patient will bathe 10/10 body parts with moderate assistance OT Short Term Goal 3: Patient will perform toilet transfer with maximal assistance OT Short Term Goal 4: Patient will perform LB dressing with moderate assistance  Skilled Therapeutic Interventions/Progress Updates:    Engaged in bed mobility and ther ex.  Pt required min assist/setup assist with transfer from bed to power scooter.  Engaged in UE strengthening and endurance with emphasis on seated upper body strengthening in ortho gym for using universal gym station: 2 sets of 12 reps at 25# of lat pull downs, 2 sets of 12 reps at 25# of behind the neck pull downs, and 2 reps of 7 of each at 35#.   Patient demonstrated appropriate energy conservation techniques by taking appropriate rest breaks, as needed. Pt enquiring about core/trunk exercises that he can do at home upon d/c, will look into creating a HEP for him.  Pain Pain Assessment Pain Assessment: No/denies pain  Therapy/Group: Individual Therapy  Sim Boast 04/14/2011, 3:20 PM

## 2011-04-15 LAB — GLUCOSE, CAPILLARY
Glucose-Capillary: 116 mg/dL — ABNORMAL HIGH (ref 70–99)
Glucose-Capillary: 152 mg/dL — ABNORMAL HIGH (ref 70–99)

## 2011-04-15 NOTE — Progress Notes (Signed)
Patient ID: Lance [name], male   [name]: December 07, 1940, 70 y.o.   MRN: [id number] Patient ID: Lance [name], male   [name]: 1940/09/08, 70 y.o.   MRN: [id number]  Subjective/Complaints: Review of Systems  Gastrointestinal: Positive for constipation.  Musculoskeletal: Positive for joint pain.  Neurological: Positive for tingling.  All other systems reviewed and are negative.  Pt decided he's ok with 12/21 dc date  Objective: Vital Signs: Blood pressure 128/71, pulse 85, temperature 97.8 F (36.6 C), temperature source Oral, resp. rate 20, height 6\' 4"  (1.93 m), weight 90.13 kg (198 lb 11.2 oz), SpO2 98.00%. No results found.  Basename 04/13/11 0600  WBC 6.8  HGB 8.5*  HCT 25.4*  PLT 288    Basename 04/13/11 0600  NA 139  K 5.1  CL 110  CO2 21  GLUCOSE 76  BUN 47*  CREATININE 2.49*  CALCIUM 9.3   CBG (last 3)   Basename 04/15/11 0712 04/14/11 2040 04/14/11 1618  GLUCAP 116* 193* 137*    Wt Readings from Last 3 Encounters:  04/14/11 90.13 kg (198 lb 11.2 oz)  03/30/11 92.987 kg (205 lb)  03/24/11 92.987 kg (205 lb)    Physical Exam:  General appearance: alert and no distress Head: Normocephalic, without obvious abnormality, atraumatic Eyes: conjunctivae/corneas clear. PERRL, EOM's intact. Fundi benign. Ears: normal TM's and external ear canals both ears Nose: Nares normal. Septum midline. Mucosa normal. No drainage or sinus tenderness. Throat: lips, mucosa, and tongue normal; teeth and gums normal Neck: no adenopathy, no carotid bruit, no JVD, supple, symmetrical, trachea midline and thyroid not enlarged, symmetric, no tenderness/mass/nodules Back: symmetric, no curvature. ROM normal. No CVA tenderness. Resp: clear to auscultation bilaterally Cardio: regular rate and rhythm, S1, S2 normal, no murmur, click, rub or gallop GI: soft, non-tender; bowel sounds normal; no masses,  no organomegaly Extremities: extremities normal, atraumatic, no cyanosis or edema    Pulses: 2+ and symmetric Skin: right calf breakdown.  bilateral heel blisters right greater than left , sacrum clean with fibronecrotic tissue but pink granulation coming through. Neurologic: hyperreflexic, RLE 1-2/5  LLE 2/5. RUE 3-4/5, LUE 4/5.  Sensory 1+/2 especially in lower extremities.  Positive clonus. Stable exam. Incision/Wound: see above   Assessment/Plan: 1. Functional deficits secondary to MS with pseudoexacerbation related tor retroperitoneal bleed which require 3+ hours per day of interdisciplinary therapy in a comprehensive inpatient rehab setting. Physiatrist is providing close team supervision and 24 hour management of active medical problems listed below. Physiatrist and rehab team continue to assess barriers to discharge/monitor patient progress toward functional and medical goals.  Working on Leisure centre manager with therapies. Family working on home mods, etc.  Mobility: Bed Mobility Bed Mobility: Yes Rolling Right: 3: Mod assist Supine to Sit: 2: Max assist Transfers Transfers: Yes Lateral/Scoot Transfers: 4: Min assist Ambulation/Gait Stairs: No Architect: Yes Wheelchair Assistance: 6: Modified independent (Device/Increase time) Environmental health practitioner: Power Wheelchair Parts Management: Independent Distance: >200 ADL:   Cognition: Cognition Overall Cognitive Status: Appears within functional limits for tasks assessed Arousal/Alertness: Awake/alert Orientation Level: Oriented X4 Safety/Judgment: Appears intact Cognition Arousal/Alertness: Awake/alert Orientation Level: Oriented X4   Medical Problem List and Plan:  1. DVT Prophylaxis/Anticoagulation: Pharmaceutical: Lovenox. Patients hgb stable and will initiate lovenox   2. Pain Management: Continue prn vicodin. No complaints at present.  3. Mood: No active issues.  4. UTI: Continue septra DS for 10 total day. Foley  5. ABLA: iron supplement.  Following HGB. Trending up/stable with today's  labs.  6. Sacral decub and bilateral heel blisters: Continue air mattress overlay. Order additional prevalon boot and use both when in bed.    -hydrotherapy to sacrum with collagenase--area showing improvement.  Appreciate PT help  -diet enhancement  7. MS with spasticity: No meds currently. Will look at pharmaceutical mgt based on tolerance of activity. Baclofen increased 10mg  tid. Not sure i see a big difference at this point  8. Diabetes Mellitus: Continue Amaryl and actos. Monitor BS with ac/hs cbg checks and use SSI for elevated BS.   CBG's  Stabilizing and acceptable at this point.    LOS (Days) 9  Tangelia Sanson T 04/15/2011, 8:20 AM     A FACE TO FACE EVALUATION WAS PERFORMED

## 2011-04-15 NOTE — Progress Notes (Signed)
Physical Therapy Note  Patient Details  Name: JUSIN QUESINBERRY MRN: [id number] Date of Birth: 10/26/1940 Today's Date: 04/15/2011  1430-1530 Pain:  No report of pain Individual Treatment Skilled clinical intervention:  Patient seen for 1:1 OT treatment this pm. To address functional mobility, and trunk control with and without Upper Extremity support.    Mariah Milling 04/15/2011, 3:41 PM

## 2011-04-15 NOTE — Progress Notes (Signed)
Physical Therapy Wound Treatment Patient Details  Name: DEMONTE WRIGHT MRN: [id number] Date of Birth: [birth year]  Today's Date: 04/15/2011 Time: [id number] Time Calculation (min): 21 min  Subjective  Subjective: No c/o's  Pain Score:    Wound Assessment  Pressure Ulcer 03/30/11 Stage II -  Partial thickness loss of dermis presenting as a shallow open ulcer with a red, pink wound bed without slough. (Active)  State of Healing Early/partial granulation 04/15/2011  1:37 PM  Site / Wound Assessment Pink;Yellow;Red 04/15/2011  1:37 PM  % Wound base Red or Granulating 25% 04/15/2011  1:37 PM  % Wound base Yellow 75% 04/15/2011  1:37 PM  % Wound base Black 0% 04/12/2011  8:30 AM  % Wound base Other (Comment) 100% 03/31/2011  8:40 AM  Peri-wound Assessment Intact 04/15/2011  1:37 PM  Wound Length (cm) 9 cm 04/13/2011  2:36 PM  Wound Width (cm) 4 cm 04/13/2011  2:36 PM  Wound Depth (cm) 0.1 cm 04/13/2011  2:36 PM  Drainage Amount Minimal 04/15/2011  1:37 PM  Drainage Description Serous 04/15/2011  1:37 PM  Treatment Hydrotherapy (Pulse lavage) 04/15/2011  1:37 PM  Dressing Type Moist to dry;Barrier Film (skin prep);Foam 04/15/2011  1:37 PM  Dressing Changed 04/15/2011  1:37 PM     Hydrotherapy Pulsed lavage therapy - wound location: rt. buttock, gluteal fold Pulsed Lavage with Suction (psi): 8 psi Pulsed Lavage with Suction - Normal Saline Used: 1000 mL Pulsed Lavage Tip: Tip with splash shield Selective Debridement Selective Debridement - Location: gluteal fold Selective Debridement - Tools Used:  (4x4's) Selective Debridement - Tissue Removed: yellow slough   Wound Assessment and Plan  Wound Therapy - Assess/Plan/Recommendations Wound Therapy - Clinical Statement: Wound making steady progress. Wound Therapy - Follow Up Recommendations: Home health RN  Wound Therapy Goals- Improve the function of patient's integumentary system by progressing the wound(s) through the phases  of wound healing (inflammation - proliferation - remodeling) by: Decrease Necrotic Tissue - Progress: Progressing toward goal Increase Granulation Tissue - Progress: Progressing toward goal Patient/Family Instruction Goal - Progress: Progressing toward goal  Goals will be updated until maximal potential achieved or discharge criteria met.  Discharge criteria: when goals achieved, discharge from hospital, MD decision/surgical intervention, no progress towards goals, refusal/missing three consecutive treatments without notification or medical reason.  Taras Rask 04/15/2011, 1:41 PM [place]

## 2011-04-15 NOTE — Progress Notes (Signed)
Occupational Therapy Weekly Progress Note & Session Note  Patient Details  Name: Lance [name] MRN: [id number] Date of Birth: 1941-03-19  Today's Date: 04/15/2011 Time: 1000-1055 Time Calculation (min): 55 min  Patient has met 4 of 4 short term goals.    Patient continues to demonstrate the following deficits: decreased activity tolerance/endurance, decrease independence with ADLs & IADLs, decreased ability to complete sit to stands. Therefore, patient will continue to benefit from skilled OT intervention to enhance overall performance with ADL and iADL tasks.   Patient progressing toward long term goals..  Continue plan of care.  OT Short Term Goals OT Short Term Goal 1: Patient will perform UB dressing sitting EOB with supervision (LTG) OT Short Term Goal 1 - Progress: Met OT Short Term Goal 2: Patient will bathe 10/10 body parts with moderate assistance OT Short Term Goal 2 - Progress: Met OT Short Term Goal 3: Patient will perform toilet transfer with maximal assistance OT Short Term Goal 3 - Progress: Met OT Short Term Goal 4: Patient will perform LB dressing with moderate assistance OT Short Term Goal 4 - Progress: Met  This weeks Short Term Goals = Long Term Goals  Therapy Documentation  Precautions: Precautions Precautions: Fall Required Braces or Orthoses: No Restrictions Weight Bearing Restrictions: No RLE Weight Bearing: Non weight bearing LLE Weight Bearing: Non weight bearing  Skilled Therapeutic Interventions/Progress Updates:  Treatment focus on bed mobility to edge of bed for LB ADLs, increasing overall activity tolerance/endurance while incorporating energy conservation techniques prn, edge of bed -> scooter transfer with close supervision and therapist holding scooter, and UB ADLs seated in scooter at sink. Also focused on trunk control and core strengthening. Patient with more than reasonable amount of time for aspects of daily living.    Pain Pain  Assessment Pain Assessment: No/denies pain Pain Score: 0-No pain  ADL ADL Eating: Set up Where Assessed-Eating: Bed level Grooming: Independent Where Assessed-Grooming: Sitting at sink Upper Body Bathing: Setup Where Assessed-Upper Body Bathing: Sitting at sink Lower Body Bathing: Minimal assistance Where Assessed-Lower Body Bathing: Edge of bed Upper Body Dressing: Setup Where Assessed-Upper Body Dressing: Sitting at sink Lower Body Dressing: Minimal assistance Where Assessed-Lower Body Dressing: Edge of bed Toileting: Not assessed Toilet Transfer: Not assessed Tub/Shower Transfer: Not assessed Gaffer Transfer: Not assessed  Therapy/Group: Individual Therapy  Emon Lance 04/15/2011, 12:00 PM

## 2011-04-15 NOTE — Progress Notes (Signed)
Physical Therapy Session Note  Patient Details  Name: Lance [name] MRN: [id number] Date of Birth: 28-Oct-1940  Today's Date: 04/15/2011 Time: 0915-1000 Time Calculation (min): 45 min  Precautions: Precautions Precautions: Fall Required Braces or Orthoses: No Restrictions Weight Bearing Restrictions: No RLE Weight Bearing: Non weight bearing LLE Weight Bearing: Non weight bearing  Short Term Goals: PT Short Term Goal 1: Pt will be able to perform bed mobility with minA PT Short Term Goal 2: Pt will be able to perform functional transfers with modA PT Short Term Goal 3: Pt will be able to propel w/c x 150 with supervision in controlled environment for UE strengthening PT Short Term Goal 4: Pt will be able to maintain dynamic sitting balance with steady A during functional tasks  Skilled Therapeutic Intervention: Tx focused on PROM bil LEs, followed by sidelying AA and active ROM for strengthening hip flexors/extensors.  RLE hip flexion/extension 7 reps before fatiguing; LLE 10  Reps before fatiguing.   General Chart Reviewed: Yes Amount of Missed PT Time (min): 15 Minutes (late start due to toileting another patient)    Pain Pain Assessment Pain Assessment: No/denies pain Pain Score: 0-No pain        Therapy/Group: Individual Therapy  [name] 04/15/2011, 10:59 AM

## 2011-04-15 NOTE — Progress Notes (Signed)
Pt alert and oriented denies pain. Bilateral ankle dressing changed;  Both have a scant amount of drainage noted,  Granulation tissue noted.  Pt transfers with a moderate assist from bed to wheelchair Katharine Look

## 2011-04-16 LAB — GLUCOSE, CAPILLARY
Glucose-Capillary: 83 mg/dL (ref 70–99)
Glucose-Capillary: 155 mg/dL — ABNORMAL HIGH (ref 70–99)
Glucose-Capillary: 110 mg/dL — ABNORMAL HIGH (ref 70–99)

## 2011-04-16 MED ORDER — BISACODYL 10 MG RE SUPP
10.0000 mg | RECTAL | Status: DC
  Administered 2011-04-18 – 2011-04-20 (×2): 10 mg via RECTAL
  Filled 2011-04-16 (×4): qty 1

## 2011-04-16 NOTE — Progress Notes (Signed)
Physical Therapy Session Note  Patient Details  Name: Lance [name] MRN: [id number] Date of Birth: [birth year]  Today's Date: 04/16/2011 Time: 1100-1155 Time Calculation (min): 55 min  Precautions: Precautions Precautions: Fall Required Braces or Orthoses: No Restrictions Weight Bearing Restrictions: No RLE Weight Bearing: Non weight bearing LLE Weight Bearing: Non weight bearing  Short Term Goals: PT Short Term Goal 1: Pt will be able to perform bed mobility with minA PT Short Term Goal 2: Pt will be able to perform functional transfers with modA PT Short Term Goal 3: Pt will be able to propel w/c x 150 with supervision in controlled environment for UE strengthening PT Short Term Goal 4: Pt will be able to maintain dynamic sitting balance with steady A during functional tasks  Skilled Therapeutic Interventions/Progress Updates: Skilled treatment session focused on 1. Neuro reed bilateral hip flexion,extension; knee flexion, extension in sidelying (gravity eliminated) and in supine.  using therapy ball 2. Bed mobility training using bilateral leg loops        Pain No complaint of pain   Mobility Sit to supine (mat)  using bilateral leg loops min assist; Rolling with leg loops (mat) SBA: supine to side to sit (mat) with leg loops min/mod assist to assist trunk - pt unable to push up with UE             Exercises   Active-assistive bilateral hip flexion and knee extension in sidelying (gravity eliminated) and multiple angle isometric holds in supine using therapy ball    Therapy/Group: Individual Therapy  Julina Altmann,JIM 04/16/2011, 12:34 PM

## 2011-04-16 NOTE — Progress Notes (Signed)
Patient ID: AUGIE MCMANAMA, male   [name]: 1941-03-31, 70 y.o.   MRN: [id number] Patient ID: RAYEN SIRI, male   [name]: 01-08-1941, 70 y.o.   MRN: [id number] Patient ID: AWAD HOGLUND, male   [name]: January 07, 1941, 70 y.o.   MRN: [id number]  Subjective/Complaints: Review of Systems  Gastrointestinal: Positive for constipation.  Musculoskeletal: Positive for joint pain.  Neurological: Positive for tingling.  All other systems reviewed and are negative.  No new complaints  Objective: Vital Signs: Blood pressure 145/71, pulse 77, temperature 97.8 F (36.6 C), temperature source Oral, resp. rate 19, height 6\' 4"  (1.93 m), weight 91 kg (200 lb 9.9 oz), SpO2 98.00%. No results found. No results found for this basename: WBC:2,HGB:2,HCT:2,PLT:2 in the last 72 hours No results found for this basename: NA:2,K:2,CL:2,CO2:2,GLUCOSE:2,BUN:2,CREATININE:2,CALCIUM:2 in the last 72 hours CBG (last 3)   Basename 04/16/11 0725 04/15/11 2046 04/15/11 1628  GLUCAP 83 134* 94    Wt Readings from Last 3 Encounters:  04/15/11 91 kg (200 lb 9.9 oz)  03/30/11 92.987 kg (205 lb)  03/24/11 92.987 kg (205 lb)    Physical Exam:  General appearance: alert and no distress Head: Normocephalic, without obvious abnormality, atraumatic Eyes: conjunctivae/corneas clear. PERRL, EOM's intact. Fundi benign. Ears: normal TM's and external ear canals both ears Nose: Nares normal. Septum midline. Mucosa normal. No drainage or sinus tenderness. Throat: lips, mucosa, and tongue normal; teeth and gums normal Neck: no adenopathy, no carotid bruit, no JVD, supple, symmetrical, trachea midline and thyroid not enlarged, symmetric, no tenderness/mass/nodules Back: symmetric, no curvature. ROM normal. No CVA tenderness. Resp: clear to auscultation bilaterally Cardio: regular rate and rhythm, S1, S2 normal, no murmur, click, rub or gallop GI: soft, non-tender; bowel sounds normal; no masses,  no organomegaly Extremities:  extremities normal, atraumatic, no cyanosis or edema  Pulses: 2+ and symmetric Skin: right calf breakdown.  bilateral heel blisters right greater than left , sacrum clean with fibronecrotic tissue but pink granulation coming through. Neurologic: hyperreflexic, RLE 1-2/5  LLE 2/5. RUE 3-4/5, LUE 4/5.  Sensory 1+/2 especially in lower extremities.  Positive clonus. Stamina a little better. Incision/Wound: see above   Assessment/Plan: 1. Functional deficits secondary to MS with pseudoexacerbation related tor retroperitoneal bleed which require 3+ hours per day of interdisciplinary therapy in a comprehensive inpatient rehab setting. Physiatrist is providing close team supervision and 24 hour management of active medical problems listed below. Physiatrist and rehab team continue to assess barriers to discharge/monitor patient progress toward functional and medical goals.  Working on Leisure centre manager with therapies. Family working on home mods, etc.  Mobility: Bed Mobility Bed Mobility: Yes Rolling Right: 3: Mod assist Supine to Sit: 2: Max assist Transfers Transfers: Yes Lateral/Scoot Transfers: 4: Min assist Ambulation/Gait Stairs: No Architect: Yes Wheelchair Assistance: 6: Modified independent (Device/Increase time) Environmental health practitioner: Power Wheelchair Parts Management: Independent Distance: >200 ADL:   Cognition: Cognition Overall Cognitive Status: Appears within functional limits for tasks assessed Arousal/Alertness: Awake/alert Orientation Level: Oriented X4 Safety/Judgment: Appears intact Cognition Arousal/Alertness: Awake/alert Orientation Level: Oriented X4   Medical Problem List and Plan:  1. DVT Prophylaxis/Anticoagulation: Pharmaceutical: Lovenox. Patients hgb stable and will initiate lovenox   2. Pain Management: Continue prn vicodin. No complaints at present.  3. Mood: No active issues.  4. UTI/Bladder:  treated   -begin cath qid  5. ABLA: iron supplement. Following HGB. Trending up/stable with today's labs.  6. Sacral decub and bilateral heel blisters: Continue air mattress overlay. Order  additional prevalon boot and use both when in bed.    -hydrotherapy to sacrum with collagenase--area showing improvement.  Appreciate PT help  -diet enhancement  7. MS with spasticity: No meds currently. Will look at pharmaceutical mgt based on tolerance of activity. Baclofen increased 10mg  tid. Not sure i see a big difference at this point  8. Diabetes Mellitus: Continue Amaryl and actos. Monitor BS with ac/hs cbg checks and use SSI for elevated BS.   CBG's  Stabilizing and acceptable at this point.  9. Neurogenic Bowel:  Augment bowel meds.  -QOD suppository    LOS (Days) 10  [name],[name] 04/16/2011, 8:22 AM     A FACE TO FACE EVALUATION WAS PERFORMED

## 2011-04-16 NOTE — Progress Notes (Signed)
Occupational Therapy Session Note  Patient Details  Name: Lance [name] MRN: [id number] Date of Birth: [birth year]  Today's Date: 04/16/2011 Time: 1015-1100 Time Calculation (min): 45 min  Precautions: Precautions Precautions: Fall Required Braces or Orthoses: No Restrictions Weight Bearing Restrictions: No RLE Weight Bearing: Non weight bearing LLE Weight Bearing: Non weight bearing   Skilled Therapeutic Interventions/Progress Updates:    ADL retraining with focus on UB bathing and dressing, scoot transfers from scooter to bed and back, bed mobility, and energy conservation. Pt seated on scooter.  Pt completed UB bathing and dressing before transferring to bed to get assist cleaning after small incontinent bowel episode during previous therapy.  Pt completes transfers with assist only to steady scooter when pt transfers.  Pt able to pull pants up supine in bed with rolling side to side.    Pain Pain Assessment Pain Assessment: No/denies pain  Therapy/Group: Individual Therapy  Leroy Libman 04/16/2011, 12:47 PM

## 2011-04-16 NOTE — Progress Notes (Signed)
Pt refused for foley catheter to be removed today.  Pt is requesting for foley catheter to be removed in the am.  Reported to oncoming nurse Katharine Look

## 2011-04-16 NOTE — Progress Notes (Signed)
Physical Therapy Note  Patient Details  Name: Lance [name] MRN: [id number] Date of Birth: December 09, 1940 Today's Date: 04/16/2011  Individual therapy 830-930 Denies pain. Bed mobility and dynamic sitting balance to work on putting on pants EOB, use of rails intermittently to keep balance. Close supervision scoot transfer to scooter. Standing frame for postural control, weightbearing through lower extremities and squats to work on hip strengthening to assist with ease of transfers and in preparation for standing x 10 reps total with rest breaks. Max HR=125bpm, rest breaks to return to resting 98bpm.   Allayne Gitelman 04/16/2011, 9:17 AM

## 2011-04-16 NOTE — Progress Notes (Signed)
Per State Regulation 482.30 This chart was reviewed for medical necessity with respect to the patient's Admission/Duration of stay. Pt & sons given team conference report Tuesday evening: Discharge date 04/24/11 w/  supervision goals.  Pt expressing some concern about family issues projected next week that he thinks he should be home for, but, older son told him to focus on his therapies & make the most progress & improvement he can.  Pt reluctant to have foley d/c'd till ready to go home to resume self caths.  He was told to discuss w/ MD.  Hydrotherapy continues to wound on bottom. Pt is participating & progressing in therapies.    Theora Master                 Nurse Care Manager              Next Review Date: 04/21/11

## 2011-04-16 NOTE — Progress Notes (Signed)
Pt is alert and oriented. Pt denies pain.  Pt transfers self from bed to motorized scooter with minimal assist of one. Bilateral heel dressings change.  Pt was incontinent of a large bowel movement this am.  Foley draining clear yellow urine.  MD order to remove foley cathter today,  Pt requested to have his foley removed at 1800 so he would be on a schedule of straight cathing self;  [id number].   Lance [name]

## 2011-04-16 NOTE — Progress Notes (Signed)
Physical Therapy Wound Treatment Patient Details  Name: Lance [name] MRN: [id number] Date of Birth: [birth year]  Today's Date: 04/16/2011 Time: [id number] Time Calculation (min): 36 min  Subjective  Subjective: "Thank you for your help."  Pain Score: Pain Score: 0-No pain  Wound Assessment  Pressure Ulcer 03/30/11 Stage II -  Partial thickness loss of dermis presenting as a shallow open ulcer with a red, pink wound bed without slough. (Active)  State of Healing Early/partial granulation 04/16/2011  2:00 PM  Site / Wound Assessment Pink;Yellow;Red 04/16/2011  2:00 PM  % Wound base Red or Granulating 25% 04/16/2011  2:00 PM  % Wound base Yellow 75% 04/16/2011  2:00 PM  % Wound base Black 0% 04/12/2011  8:30 AM  % Wound base Other (Comment) 100% 03/31/2011  8:40 AM  Peri-wound Assessment Intact 04/16/2011  2:00 PM  Wound Length (cm) 9 cm 04/13/2011  2:36 PM  Wound Width (cm) 4 cm 04/13/2011  2:36 PM  Wound Depth (cm) 0.1 cm 04/13/2011  2:36 PM  Margins Epibole (rolled edges) 04/06/2011  4:30 PM  Drainage Amount Minimal 04/16/2011  2:00 PM  Drainage Description Serous 04/16/2011  2:00 PM  Treatment Hydrotherapy (Pulse lavage);Debridement (Selective) 04/16/2011  2:00 PM  Dressing Type Moist to dry;Barrier Film (skin prep);Foam 04/16/2011  2:00 PM  Dressing Changed 04/16/2011  2:00 PM      Hydrotherapy Pulsed lavage therapy - wound location: rt. buttock, gluteal fold Pulsed Lavage with Suction (psi): 8 psi Pulsed Lavage with Suction - Normal Saline Used: 1000 mL Pulsed Lavage Tip: Tip with splash shield Selective Debridement Selective Debridement - Location: gluteal fold Selective Debridement - Tools Used: Scalpel Selective Debridement - Tissue Removed: yellow slough   Wound Assessment and Plan  Wound Therapy - Assess/Plan/Recommendations  Wound Therapy - Clinical Statement: Wound making steady progress.  Wound Therapy - Follow Up Recommendations: Home health RN   Wound  Therapy Goals- Improve the function of patient's integumentary system by progressing the wound(s) through the phases of wound healing (inflammation - proliferation - remodeling) by: Decrease Necrotic Tissue - Progress: Progressing toward goal  Increase Granulation Tissue - Progress: Progressing toward goal  Patient/Family Instruction Goal - Progress: Progressing toward goal   Goals will be updated until maximal potential achieved or discharge criteria met.  Discharge criteria: when goals achieved, discharge from hospital, MD decision/surgical intervention, no progress towards goals, refusal/missing three consecutive treatments without notification or medical reason.  Lance [name] M 04/16/2011, 2:15 PM  04/16/2011 Cyndia Bent, PT, DPT [phone]

## 2011-04-17 DIAGNOSIS — G35 Multiple sclerosis: Secondary | ICD-10-CM

## 2011-04-17 DIAGNOSIS — Z5189 Encounter for other specified aftercare: Secondary | ICD-10-CM

## 2011-04-17 LAB — GLUCOSE, CAPILLARY
Glucose-Capillary: 95 mg/dL (ref 70–99)
Glucose-Capillary: 190 mg/dL — ABNORMAL HIGH (ref 70–99)

## 2011-04-17 NOTE — Progress Notes (Signed)
Patient ID: Lance [name], male   [name]: July 06, 1940, 70 y.o.   MRN: [id number] Patient ID: Lance [name], male   [name]: 1940-08-22, 70 y.o.   MRN: [id number] Patient ID: Lance [name], male   [name]: 1940-12-08, 70 y.o.   MRN: [id number] Patient ID: Lance [name], male   [name]: April 05, 1941, 70 y.o.   MRN: [id number]  Subjective/Complaints: Review of Systems  Gastrointestinal: Positive for constipation.  Musculoskeletal: Positive for joint pain.  Neurological: Positive for tingling.  All other systems reviewed and are negative.  No new complaints  Objective: Vital Signs: Blood pressure 120/72, pulse 81, temperature 98 F (36.7 C), temperature source Oral, resp. rate 18, height 6\' 4"  (1.93 m), weight 91 kg (200 lb 9.9 oz), SpO2 97.00%. No results found. No results found for this basename: WBC:2,HGB:2,HCT:2,PLT:2 in the last 72 hours No results found for this basename: NA:2,K:2,CL:2,CO2:2,GLUCOSE:2,BUN:2,CREATININE:2,CALCIUM:2 in the last 72 hours CBG (last 3)   Basename 04/16/11 2047 04/16/11 1626 04/16/11 1203  GLUCAP 183* 110* 155*    Wt Readings from Last 3 Encounters:  04/15/11 91 kg (200 lb 9.9 oz)  03/30/11 92.987 kg (205 lb)  03/24/11 92.987 kg (205 lb)    Physical Exam:  General appearance: alert and no distress Head: Normocephalic, without obvious abnormality, atraumatic Eyes: conjunctivae/corneas clear. PERRL, EOM's intact. Fundi benign. Ears: normal TM's and external ear canals both ears Nose: Nares normal. Septum midline. Mucosa normal. No drainage or sinus tenderness. Throat: lips, mucosa, and tongue normal; teeth and gums normal Neck: no adenopathy, no carotid bruit, no JVD, supple, symmetrical, trachea midline and thyroid not enlarged, symmetric, no tenderness/mass/nodules Back: symmetric, no curvature. ROM normal. No CVA tenderness. Resp: clear to auscultation bilaterally Cardio: regular rate and rhythm, S1, S2 normal, no murmur, click, rub or  gallop GI: soft, non-tender; bowel sounds normal; no masses,  no organomegaly Extremities: extremities normal, atraumatic, no cyanosis or edema  Pulses: 2+ and symmetric Skin: right calf breakdown.  bilateral heel blisters right greater than left , sacrum clean with fibronecrotic tissue but pink granulation coming through. Neurologic: hyperreflexic, RLE 1-2/5  LLE 2/5. RUE 3-4/5, LUE 4/5.  Sensory 1+/2 especially in lower extremities.  Positive clonus. Stamina a little better.  Tone improved to trace to 1/4 Incision/Wound: see above   Assessment/Plan: 1. Functional deficits secondary to MS with pseudoexacerbation related tor retroperitoneal bleed which require 3+ hours per day of interdisciplinary therapy in a comprehensive inpatient rehab setting. Physiatrist is providing close team supervision and 24 hour management of active medical problems listed below. Physiatrist and rehab team continue to assess barriers to discharge/monitor patient progress toward functional and medical goals.  Working on Leisure centre manager with therapies. Family working on home mods, etc.  Mobility: Bed Mobility Bed Mobility: Yes Rolling Right: 3: Mod assist Supine to Sit: 2: Max assist Transfers Transfers: Yes Lateral/Scoot Transfers: 4: Min assist Ambulation/Gait Stairs: No Architect: Yes Wheelchair Assistance: 6: Modified independent (Device/Increase time) Environmental health practitioner: Power Wheelchair Parts Management: Independent Distance: >200 ADL:   Cognition: Cognition Overall Cognitive Status: Appears within functional limits for tasks assessed Arousal/Alertness: Awake/alert Orientation Level: Oriented X4 Safety/Judgment: Appears intact Cognition Arousal/Alertness: Awake/alert Orientation Level: Oriented X4   Medical Problem List and Plan:  1. DVT Prophylaxis/Anticoagulation: Pharmaceutical: Lovenox. Patients hgb stable and will initiate lovenox    2. Pain Management: Continue prn vicodin. No complaints at present.  3. Mood: No active issues.  4. UTI/Bladder: treated   -cathing qid to keep volumes  less than 500cc  5. ABLA: iron supplement. Following HGB. Trending up/stable with today's labs.  6. Sacral decub and bilateral heel blisters: Continue air mattress overlay. Order additional prevalon boot and use both when in bed.    -hydrotherapy to sacrum with collagenase--area showing improvement.  Appreciate PT help  -diet enhancement  7. MS with spasticity: No meds currently. Baclofen seems to have helped spasms  8. Diabetes Mellitus: Continue Amaryl and actos. Monitor BS with ac/hs cbg checks and use SSI for elevated BS.   CBG's  Stabilizing and acceptable at this point.  9. Neurogenic Bowel:  Augment bowel meds.  -QOD suppository    LOS (Days) 11  Lance [name] T 04/17/2011, 7:40 AM     A FACE TO FACE EVALUATION WAS PERFORMED

## 2011-04-17 NOTE — Progress Notes (Signed)
Physical Therapy Wound Treatment Patient Details  Name: Lance [name] MRN: [id number] Date of Birth: 1940/06/19  Today's Date: 04/17/2011 Time: 1130-1200 Time Calculation (min): 30 min  Subjective  Subjective: "Does it look better?" pt asked  Pain Score:    Wound Assessment  Pressure Ulcer 03/30/11 Stage II -  Partial thickness loss of dermis presenting as a shallow open ulcer with a red, pink wound bed without slough. (Active)  State of Healing Early/partial granulation 04/17/2011 12:11 PM  Site / Wound Assessment Pink;Yellow;Red 04/17/2011 12:11 PM  % Wound base Red or Granulating 50% 04/17/2011 12:11 PM  % Wound base Yellow 50% 04/17/2011 12:11 PM  % Wound base Black 0% 04/12/2011  8:30 AM  % Wound base Other (Comment) 100% 03/31/2011  8:40 AM  Peri-wound Assessment Intact 04/17/2011 12:11 PM  Wound Length (cm) 9 cm 04/13/2011  2:36 PM  Wound Width (cm) 4 cm 04/13/2011  2:36 PM  Wound Depth (cm) 0.1 cm 04/13/2011  2:36 PM  Margins Epibole (rolled edges) 04/06/2011  4:30 PM  Drainage Amount Minimal 04/17/2011 12:11 PM  Drainage Description Serous 04/17/2011 12:11 PM  Treatment Hydrotherapy (Pulse lavage) 04/17/2011 12:11 PM  Dressing Type Moist to dry;Foam;Barrier Film (skin prep) 04/17/2011 12:11 PM  Dressing Changed 04/17/2011 12:11 PM     Hydrotherapy Pulsed lavage therapy - wound location: rt. buttock, gluteal fold Pulsed Lavage with Suction (psi): 8 psi Pulsed Lavage with Suction - Normal Saline Used: 1000 mL Pulsed Lavage Tip: Tip with splash shield   Wound Assessment and Plan  Wound Therapy - Assess/Plan/Recommendations Wound Therapy - Clinical Statement: Wound making steady progress. Wound Therapy - Follow Up Recommendations: Home health RN  Wound Therapy Goals- Improve the function of patient's integumentary system by progressing the wound(s) through the phases of wound healing (inflammation - proliferation - remodeling) by: Decrease Necrotic Tissue -  Progress: Progressing toward goal Increase Granulation Tissue - Progress: Progressing toward goal Patient/Family Instruction Goal - Progress: Progressing toward goal  Goals will be updated until maximal potential achieved or discharge criteria met.  Discharge criteria: when goals achieved, discharge from hospital, MD decision/surgical intervention, no progress towards goals, refusal/missing three consecutive treatments without notification or medical reason.  Evlyn Amason 04/17/2011, 12:13 PM

## 2011-04-17 NOTE — Progress Notes (Signed)
Occupational Therapy Session Note  Patient Details  Name: REGINOLD BAYERS MRN: [id number] Date of Birth: 01/10/1941  Today's Date: 04/17/2011 Time: [id number] Time Calculation (min): 45 min  Precautions: Precautions Precautions: Fall Required Braces or Orthoses: No Restrictions Weight Bearing Restrictions: No RLE Weight Bearing: Touchdown weight bearing LLE Weight Bearing: Touchdown weight bearing  Short Term Goals: OT Short Term Goal 1: Patient will perform UB dressing sitting EOB with supervision (LTG) OT Short Term Goal 1 - Progress: Met OT Short Term Goal 2: Patient will bathe 10/10 body parts with moderate assistance OT Short Term Goal 2 - Progress: Met OT Short Term Goal 3: Patient will perform toilet transfer with maximal assistance OT Short Term Goal 3 - Progress: Met OT Short Term Goal 4: Patient will perform LB dressing with moderate assistance OT Short Term Goal 4 - Progress: Met  This weeks Short Term Goals = Long Term Goals  Skilled Therapeutic Interventions/Progress Updates:    Upon entering room patient already had pants, socks, & shoes donned. Sat at sink for UB ADLs and grooming tasks seated at sink in scooter. Also engaged in squat pivot/scoot toilet transfer <-> scooter and scooter -> edge of bed squat pivot/scoot transfer, scooting closer to head of bed & sit -> supine. Patient able to complete transfers and bed mobility with close supervision & therapist holding scooter for a safe transfer.   Pain  No complaints of pain  Therapy/Group: Individual Therapy  Breccan Galant 04/17/2011, 12:01 PM

## 2011-04-17 NOTE — Progress Notes (Signed)
Physical Therapy Note  Patient Details  Name: Lance [name] MRN: [id number] Date of Birth: 1940/05/13 Today's Date: 04/17/2011  Individual therapy 830-930 Denies pain. Focus on bed mobility and lateral leans to get pants on with minimal assist. minA transfer from bed to scooter, slightly uphill. Supervision transfer from scooter to mat, level and mat to scooter slightly uphill. Gravity eliminated RLE strengthening and ROM in sidelying, limited active movement noted, AAROM for hip and knee motion, greater hip flexion and knee extension than hip extension or knee flexion. Good tolerance of activity noted.   Canary Brim [facility] 04/17/2011, 9:04 AM

## 2011-04-17 NOTE — Progress Notes (Signed)
Occupational Therapy Session Note  Patient Details  Name: Lance [name] MRN: [id number] Date of Birth: 08-06-1940  Today's Date: 04/17/2011 Time: 1430-1530 Time Calculation (min): 60 min  Precautions: Precautions Precautions: Fall Required Braces or Orthoses: No Restrictions Weight Bearing Restrictions: No RLE Weight Bearing: Touchdown weight bearing LLE Weight Bearing: Touchdown weight bearing  Short Term Goals: OT Short Term Goal 1: Patient will perform UB dressing sitting EOB with supervision (LTG) OT Short Term Goal 1 - Progress: Met OT Short Term Goal 2: Patient will bathe 10/10 body parts with moderate assistance OT Short Term Goal 2 - Progress: Met OT Short Term Goal 3: Patient will perform toilet transfer with maximal assistance OT Short Term Goal 3 - Progress: Met OT Short Term Goal 4: Patient will perform LB dressing with moderate assistance OT Short Term Goal 4 - Progress: Met  Skilled Therapeutic Interventions/Progress Updates:    Therapeutic exercise focusing on UB strengthening and core exercises. Completed exercises in ortho gym using para gym and hand weights.   Pain Pain Assessment Pain Assessment: No/denies pain   Therapy/Group: Individual Therapy  Mirah Nevins 04/17/2011, 3:48 PM

## 2011-04-17 NOTE — Progress Notes (Signed)
Pt is alert and oriented x 4. Blisters to bilateral heels allevy drgs intact. slide board to scooter. I/O cath at 6 and 12. MRSA in nares bilateral lower extremity weakness.

## 2011-04-18 DIAGNOSIS — Z5189 Encounter for other specified aftercare: Secondary | ICD-10-CM

## 2011-04-18 DIAGNOSIS — G35 Multiple sclerosis: Secondary | ICD-10-CM

## 2011-04-18 LAB — GLUCOSE, CAPILLARY
Glucose-Capillary: 149 mg/dL — ABNORMAL HIGH (ref 70–99)
Glucose-Capillary: 176 mg/dL — ABNORMAL HIGH (ref 70–99)
Glucose-Capillary: 111 mg/dL — ABNORMAL HIGH (ref 70–99)

## 2011-04-18 NOTE — Progress Notes (Signed)
Patient alert and oriented x 3- uses call bell appropriately. Patient able to cath self independently q6h. Patient can feel sensation to have bowel movement mostly and had a continent bowel in bed pan. Patient able to scoot to bed/scooter with supervision. Patient BLE weak and decreased sensation. Patient on air overlay mattress with SR x4. Patient stage II sacral dressing done by wound therapy. Patient allevyn dressings intact to bilateral heels. New dressing put on left heel- blister scabbed over. Patient appetite good- continue with plan of care.

## 2011-04-18 NOTE — Progress Notes (Signed)
Patient ID: Lance [name], male   [name]: 1941-02-10, 70 y.o.   MRN: [id number]  Subjective/Complaints:  Review of Systems  Gastrointestinal: Positive for constipation.  Musculoskeletal: Positive for joint pain.  Neurological: Positive for tingling.  All other systems reviewed and are negative.  No new complaints  Objective: Vital Signs: Blood pressure 137/72, pulse 86, temperature 98.2 F (36.8 C), temperature source Oral, resp. rate 20, height 6\' 4"  (1.93 m), weight 91 kg (200 lb 9.9 oz), SpO2 96.00%. No results found. No results found for this basename: WBC:2,HGB:2,HCT:2,PLT:2 in the last 72 hours No results found for this basename: NA:2,K:2,CL:2,CO2:2,GLUCOSE:2,BUN:2,CREATININE:2,CALCIUM:2 in the last 72 hours CBG (last 3)   Basename 04/17/11 2133 04/17/11 1708 04/17/11 1204  GLUCAP 190* 95 158*    Wt Readings from Last 3 Encounters:  04/15/11 91 kg (200 lb 9.9 oz)  03/30/11 92.987 kg (205 lb)  03/24/11 92.987 kg (205 lb)    Physical Exam:  General appearance: alert and no distress Head: Normocephalic, without obvious abnormality, atraumatic Eyes: conjunctivae/corneas clear. PERRL, EOM's intact. Fundi benign. Ears: normal TM's and external ear canals both ears Nose: Nares normal. Septum midline. Mucosa normal. No drainage or sinus tenderness. Throat: lips, mucosa, and tongue normal; teeth and gums normal Neck: no adenopathy, no carotid bruit, no JVD, supple, symmetrical, trachea midline and thyroid not enlarged, symmetric, no tenderness/mass/nodules Back: symmetric, no curvature. ROM normal. No CVA tenderness. Resp: clear to auscultation bilaterally Cardio: regular rate and rhythm, S1, S2 normal, no murmur, click, rub or gallop GI: soft, non-tender; bowel sounds normal; no masses,  no organomegaly Extremities: extremities normal, atraumatic, no cyanosis or edema  Pulses: 2+ and symmetric Skin: right calf breakdown.  bilateral heel blisters right greater than left ,  sacrum clean with fibronecrotic tissue but pink granulation coming through. Neurologic: hyperreflexic, RLE 1-2/5  LLE 2/5. RUE 3-4/5, LUE 4/5.  Sensory 1+/2 especially in lower extremities.  Positive clonus. Stamina a little better.  Tone improved to trace to 1/4 Incision/Wound: see above   Assessment/Plan: 1. Functional deficits secondary to MS with pseudoexacerbation related tor retroperitoneal bleed which require 3+ hours per day of interdisciplinary therapy in a comprehensive inpatient rehab setting. Physiatrist is providing close team supervision and 24 hour management of active medical problems listed below. Physiatrist and rehab team continue to assess barriers to discharge/monitor patient progress toward functional and medical goals.  Working on Leisure centre manager with therapies. Family working on home mods, etc.  Mobility: Bed Mobility Bed Mobility: Yes Rolling Right: 3: Mod assist Supine to Sit: 2: Max assist Transfers Transfers: Yes Lateral/Scoot Transfers: 4: Min assist Ambulation/Gait Stairs: No Architect: Yes Wheelchair Assistance: 6: Modified independent (Device/Increase time) Environmental health practitioner: Power Wheelchair Parts Management: Independent Distance: >200 ADL:   Cognition: Cognition Overall Cognitive Status: Appears within functional limits for tasks assessed Arousal/Alertness: Awake/alert Orientation Level: Oriented X4 Safety/Judgment: Appears intact Cognition Arousal/Alertness: Awake/alert Orientation Level: Oriented X4   Medical Problem List and Plan:  1. DVT Prophylaxis/Anticoagulation: Pharmaceutical: Lovenox. Patients hgb stable and will initiate lovenox   2. Pain Management: Continue prn vicodin. No complaints at present.  3. Mood: No active issues.  4. UTI/Bladder: treated   -cathing qid to keep volumes less than 500cc  5. ABLA: iron supplement. Following HGB. Trending up/stable with today's  labs.  6. Sacral decub and bilateral heel blisters: Continue air mattress overlay. Order additional prevalon boot and use both when in bed.    -hydrotherapy to sacrum with collagenase--area showing improvement.  Appreciate PT help  -diet enhancement  7. MS with spasticity: No meds currently. Baclofen seems to have helped spasms  8. Diabetes Mellitus: Continue Amaryl and actos. Monitor BS with ac/hs cbg checks and use SSI for elevated BS.   CBG's  Stabilizing and acceptable at this point.  9. Neurogenic Bowel:  Augment bowel meds.  -QOD suppository    LOS (Days) 12  [name] E 04/18/2011, 7:13 AM     A FACE TO FACE EVALUATION WAS PERFORMED

## 2011-04-18 NOTE — Progress Notes (Signed)
Physical Therapy Note  Patient Details  Name: Lance [name] MRN: [id number] Date of Birth: Sep 07, 1940 Today's Date: 04/18/2011 Time: 1500-1600; (60') Pain: No pain at this time  Therapeutic Exercise; (30')  Nu-Step x 15' at level 3,  B LE exercises in sitting and rest periods in between.  Therapeutic Activity: (30') Standing Frame x 15' with weight shifting, posture control with single UE support on tabletop.  Transfer training scooter to/from Nu-step with min-A and scooter to/from Standing frame with min-A.  Rest breaks in between transfers.  Individual Treatment Session   Sylvester Harder 04/18/2011, 3:22 PM

## 2011-04-18 NOTE — Progress Notes (Signed)
Physical Therapy Wound Treatment Patient Details  Name: Lance [name] MRN: [id number] Date of Birth: February 19, 1941  Today's Date: 04/18/2011 Time: [id number] Time Calculation (min): 26 min  Subjective     Pain Score:    Wound Assessment  Pressure Ulcer 03/30/11 Stage II -  Partial thickness loss of dermis presenting as a shallow open ulcer with a red, pink wound bed without slough. (Active)  State of Healing Early/partial granulation 04/18/2011  9:53 AM  Site / Wound Assessment Pink;Yellow;Red 04/18/2011  9:53 AM  % Wound base Red or Granulating 50% 04/18/2011  9:53 AM  % Wound base Yellow 50% 04/18/2011  9:53 AM  % Wound base Black 0% 04/12/2011  8:30 AM  % Wound base Other (Comment) 100% 03/31/2011  8:40 AM  Peri-wound Assessment Intact 04/18/2011  9:53 AM  Wound Length (cm) 9 cm 04/13/2011  2:36 PM  Wound Width (cm) 4 cm 04/13/2011  2:36 PM  Wound Depth (cm) 0.1 cm 04/13/2011  2:36 PM  Margins Epibole (rolled edges) 04/06/2011  4:30 PM  Drainage Amount Minimal 04/18/2011  9:53 AM  Drainage Description Serous 04/18/2011  9:53 AM  Treatment Hydrotherapy (Pulse lavage) 04/18/2011  9:53 AM  Dressing Type Moist to dry;Foam;Barrier Film (skin prep) 04/18/2011  9:53 AM  Dressing Changed 04/18/2011  9:53 AM     Hydrotherapy Pulsed lavage therapy - wound location: rt. buttock, gluteal fold Pulsed Lavage with Suction (psi): 8 psi Pulsed Lavage with Suction - Normal Saline Used: 1000 mL Pulsed Lavage Tip: Tip with splash shield   Wound Assessment and Plan  Wound Therapy - Assess/Plan/Recommendations Wound Therapy - Clinical Statement: Steady progress. Wound Therapy - Follow Up Recommendations: Home health RN  Wound Therapy Goals- Improve the function of patient's integumentary system by progressing the wound(s) through the phases of wound healing (inflammation - proliferation - remodeling) by: Decrease Necrotic Tissue - Progress: Progressing toward goal Increase Granulation  Tissue - Progress: Progressing toward goal Patient/Family Instruction Goal - Progress: Progressing toward goal  Goals will be updated until maximal potential achieved or discharge criteria met.  Discharge criteria: when goals achieved, discharge from hospital, MD decision/surgical intervention, no progress towards goals, refusal/missing three consecutive treatments without notification or medical reason.  Lance [name] 04/18/2011, 9:55 AM Suanne Marker PT [phone]

## 2011-04-19 LAB — GLUCOSE, CAPILLARY: Glucose-Capillary: 142 mg/dL — ABNORMAL HIGH (ref 70–99)

## 2011-04-19 NOTE — Progress Notes (Signed)
Patient in and out cathed himself for 400 ml.  Tolerated well.

## 2011-04-19 NOTE — Progress Notes (Signed)
Patient alert and oriented x 3- uses call bell appropriately. Patient able to cath self independently q6h. Patient last bowel movement on 04/18/11-continent.  Patient able to scoot to bed/scooter with supervision. Patient BLE weak and decreased sensation. Patient on air overlay mattress with SR x4. Patient stage II sacral dressing changed. Patient allevyn dressings intact to bilateral heels. Patient appetite good- continue with plan of care. Patient denies any pain.

## 2011-04-19 NOTE — Progress Notes (Signed)
Patient ID: Lance [name], male   [name]: May 31, 1940, 70 y.o.   MRN: [id number]  Subjective/Complaints:   Review of Systems  Gastrointestinal: Negative for constipation.  Musculoskeletal: Positive for joint pain.  Neurological: Positive for tingling.  All other systems reviewed and are negative.  No new complaints  Objective: Vital Signs: Blood pressure 143/57, pulse 75, temperature 97.7 F (36.5 C), temperature source Oral, resp. rate 20, height 6\' 4"  (1.93 m), weight 91 kg (200 lb 9.9 oz), SpO2 97.00%. No results found. No results found for this basename: WBC:2,HGB:2,HCT:2,PLT:2 in the last 72 hours No results found for this basename: NA:2,K:2,CL:2,CO2:2,GLUCOSE:2,BUN:2,CREATININE:2,CALCIUM:2 in the last 72 hours CBG (last 3)   Basename 04/18/11 2105 04/18/11 1643 04/18/11 1156  GLUCAP 111* 176* 149*    Wt Readings from Last 3 Encounters:  04/15/11 91 kg (200 lb 9.9 oz)  03/30/11 92.987 kg (205 lb)  03/24/11 92.987 kg (205 lb)    Physical Exam:  General appearance: alert and no distress Head: Normocephalic, without obvious abnormality, atraumatic Eyes: conjunctivae/corneas clear. PERRL, EOM's intact. Fundi benign. Ears: normal TM's and external ear canals both ears Nose: Nares normal. Septum midline. Mucosa normal. No drainage or sinus tenderness. Throat: lips, mucosa, and tongue normal; teeth and gums normal Neck: no adenopathy, no carotid bruit, no JVD, supple, symmetrical, trachea midline and thyroid not enlarged, symmetric, no tenderness/mass/nodules Back: symmetric, no curvature. ROM normal. No CVA tenderness. Resp: clear to auscultation bilaterally Cardio: regular rate and rhythm, S1, S2 normal, no murmur, click, rub or gallop GI: soft, non-tender; bowel sounds normal; no masses,  no organomegaly Extremities: extremities normal, atraumatic, no cyanosis or edema  Pulses: 2+ and symmetric Skin: right calf breakdown.  bilateral heel blisters right greater than left  , sacrum clean with fibronecrotic tissue but pink granulation coming through. Neurologic: hyperreflexic, RLE 1-2/5  LLE 2/5. RUE 3-4/5, LUE 4/5.  Sensory 1+/2 especially in lower extremities.  Positive clonus. Stamina a little better.  Tone improved to trace to 1/4 Incision/Wound: see above   Assessment/Plan: 1. Functional deficits secondary to MS with pseudoexacerbation related tor retroperitoneal bleed which require 3+ hours per day of interdisciplinary therapy in a comprehensive inpatient rehab setting. Physiatrist is providing close team supervision and 24 hour management of active medical problems listed below. Physiatrist and rehab team continue to assess barriers to discharge/monitor patient progress toward functional and medical goals.  Working on Leisure centre manager with therapies. Family working on home mods, etc.  Mobility: Bed Mobility Bed Mobility: Yes Rolling Right: 3: Mod assist Supine to Sit: 2: Max assist Transfers Transfers: Yes Lateral/Scoot Transfers: 4: Min assist Ambulation/Gait Stairs: No Architect: Yes Wheelchair Assistance: 6: Modified independent (Device/Increase time) Environmental health practitioner: Power Wheelchair Parts Management: Independent Distance: >200 ADL:   Cognition: Cognition Overall Cognitive Status: Appears within functional limits for tasks assessed Arousal/Alertness: Awake/alert Orientation Level: Oriented X4 Safety/Judgment: Appears intact Cognition Arousal/Alertness: Awake/alert Orientation Level: Oriented X4   Medical Problem List and Plan:  1. DVT Prophylaxis/Anticoagulation: Pharmaceutical: Lovenox. Patients hgb stable and will initiate lovenox   2. Pain Management: Continue prn vicodin. No complaints at present.  3. Mood: No active issues.  4. UTI/Bladder: treated   -cathing qid to keep volumes less than 500cc  5. ABLA: iron supplement. Following HGB. Trending up/stable with today's  labs.  6. Sacral decub and bilateral heel blisters: Continue air mattress overlay. Order additional prevalon boot and use both when in bed.    -hydrotherapy to sacrum with collagenase--area showing  improvement.  Appreciate PT help  -diet enhancement  7. MS with spasticity: No meds currently. Baclofen seems to have helped spasms  8. Diabetes Mellitus: Continue Amaryl and actos. Monitor BS with ac/hs cbg checks and use SSI for elevated BS.   CBG's  Stabilizing and acceptable at this point.  9. Neurogenic Bowel:  Augment bowel meds.  -QOD suppository    LOS (Days) 13  Deb Loudin E 04/19/2011, 7:22 AM     A FACE TO FACE EVALUATION WAS PERFORMED

## 2011-04-19 NOTE — Progress Notes (Signed)
Occupational Therapy Session Note  Patient Details  Name: Lance [name] MRN: [id number] Date of Birth: 10/23/1940  Today's Date: 04/19/2011 Time: 1000-1045 Time Calculation (min): 45 min  Precautions: Precautions Precautions: Fall Required Braces or Orthoses: No Restrictions Weight Bearing Restrictions: No RLE Weight Bearing: Touchdown weight bearing LLE Weight Bearing: Touchdown weight bearing  Short Term Goals: OT Short Term Goal 1: Patient will perform UB dressing sitting EOB with supervision (LTG) OT Short Term Goal 1 - Progress: Met OT Short Term Goal 2: Patient will bathe 10/10 body parts with moderate assistance OT Short Term Goal 2 - Progress: Met OT Short Term Goal 3: Patient will perform toilet transfer with maximal assistance OT Short Term Goal 3 - Progress: Met OT Short Term Goal 4: Patient will perform LB dressing with moderate assistance OT Short Term Goal 4 - Progress: Met  Skilled Therapeutic Interventions/Progress Updates:    dressing EOB with lateral leans to pull pants over hips; endurance and transfer to automatic chair training  General   Vital Signs Therapy Vitals Temp: 97.6 F (36.4 C) Temp src: Oral Pulse Rate: 86  Resp: 18  BP: 157/74 mmHg Patient Position, if appropriate: Sitting Oxygen Therapy SpO2: 99 % O2 Device: None (Room air) Pain   ADL ADL Eating: Set up Where Assessed-Eating: Bed level Grooming: Independent Where Assessed-Grooming: Sitting at sink Upper Body Bathing: Setup Where Assessed-Upper Body Bathing: Sitting at sink Lower Body Bathing: Minimal assistance Where Assessed-Lower Body Bathing: Edge of bed Upper Body Dressing: Setup Where Assessed-Upper Body Dressing: Sitting at sink Lower Body Dressing: Minimal assistance Where Assessed-Lower Body Dressing: Edge of bed Toileting: Not assessed Toilet Transfer: Not assessed Tub/Shower Transfer: Not assessed Gaffer Transfer: Not assessed  Exercises     Other Treatments    Therapy/Group: Individual Therapy  Herschell Dimes 04/19/2011, 5:25 PM

## 2011-04-19 NOTE — Progress Notes (Signed)
Patient performed self in and out cath for 420 ml.  Tolerated well.

## 2011-04-20 LAB — GLUCOSE, CAPILLARY
Glucose-Capillary: 104 mg/dL — ABNORMAL HIGH (ref 70–99)
Glucose-Capillary: 169 mg/dL — ABNORMAL HIGH (ref 70–99)

## 2011-04-20 NOTE — Progress Notes (Signed)
Occupational Therapy Session Note  Patient Details  Name: Lance [name] MRN: [id number] Date of Birth: 10-21-1940  Today's Date: 04/20/2011 Time: 1015-1100 Time Calculation (min): 45 min  Precautions: Precautions Precautions: Fall Required Braces or Orthoses: No Restrictions Weight Bearing Restrictions: No RLE Weight Bearing: Touchdown weight bearing LLE Weight Bearing: Touchdown weight bearing  Short Term Goals: OT Short Term Goal 1: Patient will perform UB dressing sitting EOB with supervision (LTG) OT Short Term Goal 1 - Progress: Met OT Short Term Goal 2: Patient will bathe 10/10 body parts with moderate assistance OT Short Term Goal 2 - Progress: Met OT Short Term Goal 3: Patient will perform toilet transfer with maximal assistance OT Short Term Goal 3 - Progress: Met OT Short Term Goal 4: Patient will perform LB dressing with moderate assistance OT Short Term Goal 4 - Progress: Met  Skilled Therapeutic Interventions/Progress Updates:    Upon entering room, patient stated he thought he had an incontinent bowel movement. Transferred from scooter back to bed with minimal assistance. Patient doffed pants from waist with help from therapist to doff from feet. Engaged in bed mobility for perineal hygiene. Total assist x2 to clean up. Left patient supine in bed with HOB raised for self cath.   Pain  No complaints of pain  Therapy/Group: Individual Therapy  Anniebell Bedore 04/20/2011, 11:05 AM

## 2011-04-20 NOTE — Progress Notes (Signed)
Physical Therapy Session Note  Patient Details  Name: Lance [name] MRN: [id number] Date of Birth: January 28, 1941  Today's Date: 04/20/2011 Time: 0912-1011 Time Calculation (min): 59 min 2nd treatment time:  14:45-15:30 Time Calculation:  45 min Precautions: Precautions Precautions: Fall Required Braces or Orthoses: No Short Term Goals: PT Short Term Goal 1: Pt will be able to perform bed mobility with minA PT Short Term Goal 2: Pt will be able to perform functional transfers with modA PT Short Term Goal 3: Pt will be able to propel w/c x 150 with supervision in controlled environment for UE strengthening PT Short Term Goal 4: Pt will be able to maintain dynamic sitting balance with steady A during functional tasks  Skilled Therapeutic Interventions/Progress Updates:   Treatment 1: Assisted pt with getting off bed pan and donning brief, pants, socks, and shoes.  Supine to sit with supervision.  Scoot/pivot bed-w/c/chair with min@ to hold scooter. NuStep x 12 min, for LE strengthening and endurance, set on workload -= 4, between 30-50 watts.  Pt with bowel accident at end of session.  HR and O2 sats monitored during whole session.  Treatment 2: Standing frame x 4 reps, first rep with pt freeing UE one at a time to reach, 2nd rep with pt increasing standing tolerance, 3rd and 4th trials with pt performing hip extension exercise in closed chain, performed 7 and 10 reps.  HR and o2 sats monitored during whole session.  Pt concerned about energy conservation.  Recognizing that he did too much on Friday and was still trying to recover today from it.  Cut session 15 min short, due to fatigue and pt not wanting to overdo.  Pain  No pain either session  Therapy/Group: Individual Therapy  Waylan Boga 04/20/2011, 10:15 AM

## 2011-04-20 NOTE — Progress Notes (Signed)
Patient ID: Lance [name], male   [name]: November 24, 1940, 70 y.o.   MRN: [id number] Patient ID: Lance [name], male   [name]: 09/18/1940, 70 y.o.   MRN: [id number]  Subjective/Complaints:  Review of Systems  Gastrointestinal: Positive for constipation.  Musculoskeletal: Positive for joint pain.  Neurological: Positive for tingling.  All other systems reviewed and are negative.  a little low back pain after lifting hand weights yesterday  Objective: Vital Signs: Blood pressure 143/65, pulse 82, temperature 97.9 F (36.6 C), temperature source Oral, resp. rate 18, height 6\' 4"  (1.93 m), weight 91 kg (200 lb 9.9 oz), SpO2 95.00%. No results found. No results found for this basename: WBC:2,HGB:2,HCT:2,PLT:2 in the last 72 hours No results found for this basename: NA:2,K:2,CL:2,CO2:2,GLUCOSE:2,BUN:2,CREATININE:2,CALCIUM:2 in the last 72 hours CBG (last 3)   Basename 04/19/11 2044 04/19/11 1651 04/19/11 1129  GLUCAP 142* 164* 133*    Wt Readings from Last 3 Encounters:  04/15/11 91 kg (200 lb 9.9 oz)  03/30/11 92.987 kg (205 lb)  03/24/11 92.987 kg (205 lb)    Physical Exam:  General appearance: alert and no distress Head: Normocephalic, without obvious abnormality, atraumatic Eyes: conjunctivae/corneas clear. PERRL, EOM's intact. Fundi benign. Ears: normal TM's and external ear canals both ears Nose: Nares normal. Septum midline. Mucosa normal. No drainage or sinus tenderness. Throat: lips, mucosa, and tongue normal; teeth and gums normal Neck: no adenopathy, no carotid bruit, no JVD, supple, symmetrical, trachea midline and thyroid not enlarged, symmetric, no tenderness/mass/nodules Back: symmetric, no curvature. ROM normal. No CVA tenderness. Resp: clear to auscultation bilaterally Cardio: regular rate and rhythm, S1, S2 normal, no murmur, click, rub or gallop GI: soft, non-tender; bowel sounds normal; no masses,  no organomegaly Extremities: extremities normal, atraumatic, no  cyanosis or edema  Pulses: 2+ and symmetric Skin: right calf breakdown.  bilateral heel blisters right greater than left , sacrum clean with decreased fibronecrotic tissue -50% pink granulation coming through. Neurologic: hyperreflexic, RLE 1-2/5  LLE 2/5. RUE 3-4/5, LUE 4/5.  Sensory 1+/2 especially in lower extremities.  Positive clonus. Stamina a little better.  Tone improved to trace to 1/4.  No substantial change here Incision/Wound: see above   Assessment/Plan: 1. Functional deficits secondary to MS with pseudoexacerbation related tor retroperitoneal bleed which require 3+ hours per day of interdisciplinary therapy in a comprehensive inpatient rehab setting. Physiatrist is providing close team supervision and 24 hour management of active medical problems listed below. Physiatrist and rehab team continue to assess barriers to discharge/monitor patient progress toward functional and medical goals.  Working on Leisure centre manager with therapies. Family working on home mods, etc.  Mobility: Bed Mobility Bed Mobility: Yes Rolling Right: 3: Mod assist Supine to Sit: 2: Max assist Transfers Transfers: Yes Lateral/Scoot Transfers: 4: Min assist Ambulation/Gait Stairs: No Architect: Yes Wheelchair Assistance: 6: Modified independent (Device/Increase time) Environmental health practitioner: Power Wheelchair Parts Management: Independent Distance: >200 ADL:   Cognition: Cognition Overall Cognitive Status: Appears within functional limits for tasks assessed Arousal/Alertness: Awake/alert Orientation Level: Oriented X4 Safety/Judgment: Appears intact Cognition Arousal/Alertness: Awake/alert Orientation Level: Oriented X4   Medical Problem List and Plan:  1. DVT Prophylaxis/Anticoagulation: Pharmaceutical: Lovenox. Patients hgb stable and will initiate lovenox   2. Pain Management: Continue prn vicodin. No complaints at present.  Asked patient to  back off weights a bit and work on simple postural/core exercises.  3. Mood: No active issues.  4. UTI/Bladder: treated   -cathing qid to keep volumes less than 500cc  5. ABLA: iron supplement. Following HGB. Trending up/stable. Recheck tomorrow  6. Sacral decub and bilateral heel blisters: Continue air mattress overlay. Order additional prevalon boot and use both when in bed.    -hydrotherapy to sacrum with collagenase--should be ready for home care by end of week  Appreciate PT help  -diet enhancement  7. MS with spasticity: No meds currently. Baclofen seems to have helped spasms  8. Diabetes Mellitus: Continue Amaryl and actos. Monitor BS with ac/hs cbg checks and use SSI for elevated BS.   CBG's  Stabilizing and acceptable at this point.  9. Neurogenic Bowel:  Augment bowel meds.  -QOD suppository working well.    LOS (Days) 14  Lance [name] T 04/20/2011, 7:25 AM     A FACE TO FACE EVALUATION WAS PERFORMED

## 2011-04-20 NOTE — Progress Notes (Signed)
Continuing to self-cath q 6hrs with average output of 350-468ml. Last bowel movement 04/20/11 after suppository. Pt incontinent with large, soft, brown stool in brief. Soiling clothes, and linen. Pt reports feeling urge to defecate during therapy session, however, unable to get to commode on time. Scoot/pivot transfers with Min assist and extra time.  R heel and R shin healing appropriately, L heel with dark,  blistery area with allevyn drsg intact. Daily hydrotherapy to stage 2 on sacrum. No c/o pain. Participating in therapy.

## 2011-04-20 NOTE — Progress Notes (Signed)
Physical Therapy Wound Treatment Patient Details  Name: Lance [name] MRN: [id number] Date of Birth: 1941-02-08  Today's Date: 04/20/2011 Time: [id number] Time Calculation (min): 34 min  Subjective  Subjective: No c/o's  Pain Score:    Wound Assessment  Pressure Ulcer 03/30/11 Stage II -  Partial thickness loss of dermis presenting as a shallow open ulcer with a red, pink wound bed without slough. (Active)  State of Healing Early/partial granulation 04/20/2011  2:07 PM  Site / Wound Assessment Pink;Yellow 04/20/2011  2:07 PM  % Wound base Red or Granulating 50% 04/20/2011  2:07 PM  % Wound base Yellow 50% 04/20/2011  2:07 PM  % Wound base Black 0% 04/12/2011  8:30 AM  % Wound base Other (Comment) 100% 03/31/2011  8:40 AM  Peri-wound Assessment Intact 04/20/2011  2:07 PM  Wound Length (cm) 6 cm 04/20/2011  2:07 PM  Wound Width (cm) 2 cm 04/20/2011  2:07 PM  Wound Depth (cm) 0.1 cm 04/20/2011  2:07 PM  Drainage Amount Minimal 04/20/2011  2:07 PM  Drainage Description Serous 04/20/2011  2:07 PM  Treatment Hydrotherapy (Pulse lavage) 04/20/2011  2:07 PM  Dressing Type Foam;Barrier Film (skin prep) 04/20/2011  2:07 PM  Dressing Changed 04/20/2011  2:07 PM     Hydrotherapy Pulsed lavage therapy - wound location: rt. buttock, gluteal fold Pulsed Lavage with Suction (psi): 8 psi Pulsed Lavage with Suction - Normal Saline Used: 1000 mL Pulsed Lavage Tip: Tip with splash shield   Wound Assessment and Plan  Wound Therapy - Assess/Plan/Recommendations Wound Therapy - Clinical Statement: Wound size is decreasing. Hydrotherapy Plan: Debridement;Dressing change;Patient/family education;Pulsatile lavage with suction Wound Therapy - Frequency: 6X / week Wound Therapy - Follow Up Recommendations: Home health RN  Wound Therapy Goals- Improve the function of patient's integumentary system by progressing the wound(s) through the phases of wound healing (inflammation - proliferation -  remodeling) by: Decrease Necrotic Tissue - Progress: Progressing toward goal Increase Granulation Tissue - Progress: Progressing toward goal Patient/Family Instruction Goal - Progress: Met Goals/treatment plan/discharge plan were made with and agreed upon by patient/family: Yes Time For Goal Achievement: 7 days Wound Therapy - Potential for Goals: Good  Goals will be updated until maximal potential achieved or discharge criteria met.  Discharge criteria: when goals achieved, discharge from hospital, MD decision/surgical intervention, no progress towards goals, refusal/missing three consecutive treatments without notification or medical reason.  [name] 04/20/2011, 2:12 PM [employer] PT [phone]

## 2011-04-21 DIAGNOSIS — Z5189 Encounter for other specified aftercare: Secondary | ICD-10-CM

## 2011-04-21 DIAGNOSIS — G35 Multiple sclerosis: Secondary | ICD-10-CM

## 2011-04-21 LAB — CBC
HCT: 26.6 % — ABNORMAL LOW (ref 39.0–52.0)
MCV: 95.3 fL (ref 78.0–100.0)
RDW: 15 % (ref 11.5–15.5)
WBC: 5.5 10*3/uL (ref 4.0–10.5)

## 2011-04-21 LAB — BASIC METABOLIC PANEL
CO2: 24 mEq/L (ref 19–32)
Calcium: 9.4 mg/dL (ref 8.4–10.5)
Creatinine, Ser: 1.44 mg/dL — ABNORMAL HIGH (ref 0.50–1.35)
Glucose, Bld: 91 mg/dL (ref 70–99)

## 2011-04-21 LAB — DIFFERENTIAL
Eosinophils Relative: 0 % (ref 0–5)
Lymphocytes Relative: 39 % (ref 12–46)
Lymphs Abs: 2.1 10*3/uL (ref 0.7–4.0)
Monocytes Absolute: 0.5 10*3/uL (ref 0.1–1.0)

## 2011-04-21 LAB — GLUCOSE, CAPILLARY
Glucose-Capillary: 168 mg/dL — ABNORMAL HIGH (ref 70–99)
Glucose-Capillary: 140 mg/dL — ABNORMAL HIGH (ref 70–99)

## 2011-04-21 NOTE — Progress Notes (Signed)
Physical Therapy Note  Patient Details  Name: Lance [name] MRN: [id number] Date of Birth: May 22, 1940 Today's Date: 04/21/2011  Individual therapy [id number] Denies pain. Treatment session focused on lower body dressing incorporating bed mobility, core strengthening, and dynamic sitting balance tasks. Pt required between supervision to [facility] for repeated sidelying to sit for various tasks. Transfer from bed to scooter lateral scoot technique with close S. Practiced car transfer with slideboard and leg loops, found out that seat of car is higher than originally practiced, pt required modA for uphill slideboard transfer into the car, managed lower extremities with loops independently. S slideboard almost level surface out of car. Discussed need to make transfers this week uneven in preparation for the car transfer since pt can complete level transfers without issue - pt in agreement.   Canary Brim [facility] 04/21/2011, 8:58 AM

## 2011-04-21 NOTE — Progress Notes (Signed)
Therapeutic Recreation Discharge Summary Patient Details  Name: Lance [name] MRN: [id number] Date of Birth: [birth year]  Long term goals set: 1  Long term goals met: 1  Comments on progress toward goals: Pt has made great progress toward goals meeting Mod I for seated TR tasks using scooter.  Pt can retrieve needed items and complete them given extra time.  Pt set for discharge 12/21 home with family.  Reasons for discharge: discharge from hospital  Patient/family agrees with progress made and goals achieved: Yes  [name] 04/21/2011, 12:48 PM

## 2011-04-21 NOTE — Progress Notes (Signed)
Occupational Therapy Session Note  Patient Details  Name: TAJH PINKHASOV MRN: [id number] Date of Birth: [birth year]  Today's Date: 04/21/2011 Time: 1000-1100 Time Calculation (min): 60 min  Precautions: Precautions Precautions: Fall Required Braces or Orthoses: No Restrictions Weight Bearing Restrictions: No RLE Weight Bearing: Touchdown weight bearing LLE Weight Bearing: Touchdown weight bearing  Short Term Goals: OT Short Term Goal 1: Patient will perform UB dressing sitting EOB with supervision (LTG) OT Short Term Goal 1 - Progress: Met OT Short Term Goal 2: Patient will bathe 10/10 body parts with moderate assistance OT Short Term Goal 2 - Progress: Met OT Short Term Goal 3: Patient will perform toilet transfer with maximal assistance OT Short Term Goal 3 - Progress: Met OT Short Term Goal 4: Patient will perform LB dressing with moderate assistance OT Short Term Goal 4 - Progress: Met  Skilled Therapeutic Interventions/Progress Updates:    Upon entering room patient seated in scooter. Completed UB bathing and dressing at sink side as well as grooming tasks at a supervision level. Also focused on UE and core strengthening exercises using dumbbell weights and sit to stands x4 from scooter using grab bars in bathroom. Patient unable to fully stand, most exertion/strength for standing comes from UEs.   Pain Pain Assessment Pain Score: 0-No pain  Therapy/Group: Individual Therapy  Nicoles Sedlacek 04/21/2011, 12:14 PM

## 2011-04-21 NOTE — Progress Notes (Signed)
Patient ID: ZACHORY HATZ, male   [name]: [birth year], 71 y.o.   MRN: [id number] Patient ID: CHADERICK NABA, male   [name]: [birth year], 70 y.o.   MRN: [id number] Patient ID: LOYLE LEVECK, male   [name]: 06-Mar-1941, 70 y.o.   MRN: [id number]  Subjective/Complaints:  Review of Systems  Gastrointestinal: Positive for constipation.  Musculoskeletal: Positive for joint pain.  Neurological: Positive for tingling.  All other systems reviewed and are negative.  was excited to do some squats yesterday with PT  Objective: Vital Signs: Blood pressure 139/76, pulse 87, temperature 97.4 F (36.3 C), temperature source Oral, resp. rate 20, height 6\' 4"  (1.93 m), weight 91 kg (200 lb 9.9 oz), SpO2 100.00%. No results found.  Basename 04/21/11 0650  WBC 5.5  HGB 8.6*  HCT 26.6*  PLT 211   No results found for this basename: NA:2,K:2,CL:2,CO2:2,GLUCOSE:2,BUN:2,CREATININE:2,CALCIUM:2 in the last 72 hours CBG (last 3)   Basename 04/21/11 0705 04/20/11 1954 04/20/11 1613  GLUCAP 100* 157* 104*    Wt Readings from Last 3 Encounters:  04/15/11 91 kg (200 lb 9.9 oz)  03/30/11 92.987 kg (205 lb)  03/24/11 92.987 kg (205 lb)    Physical Exam:  General appearance: alert and no distress Head: Normocephalic, without obvious abnormality, atraumatic Eyes: conjunctivae/corneas clear. PERRL, EOM's intact. Fundi benign. Ears: normal TM's and external ear canals both ears Nose: Nares normal. Septum midline. Mucosa normal. No drainage or sinus tenderness. Throat: lips, mucosa, and tongue normal; teeth and gums normal Neck: no adenopathy, no carotid bruit, no JVD, supple, symmetrical, trachea midline and thyroid not enlarged, symmetric, no tenderness/mass/nodules Back: symmetric, no curvature. ROM normal. No CVA tenderness. Resp: clear to auscultation bilaterally Cardio: regular rate and rhythm, S1, S2 normal, no murmur, click, rub or gallop GI: soft, non-tender; bowel sounds normal; no masses,  no  organomegaly Extremities: extremities normal, atraumatic, no cyanosis or edema  Pulses: 2+ and symmetric Skin: right calf breakdown.  bilateral heel blisters right greater than left , sacrum clean with decreased fibronecrotic tissue -50% pink granulation coming through. Neurologic: hyperreflexic, RLE 1-2/5  LLE 2/5. RUE 3-4/5, LUE 4/5.  Sensory 1+/2 especially in lower extremities.  Positive clonus. Stamina a little better.  Tone improved to trace to 1/4.  No substantial change here Incision/Wound: see above   Assessment/Plan: 1. Functional deficits secondary to MS with pseudoexacerbation related tor retroperitoneal bleed which require 3+ hours per day of interdisciplinary therapy in a comprehensive inpatient rehab setting. Physiatrist is providing close team supervision and 24 hour management of active medical problems listed below. Physiatrist and rehab team continue to assess barriers to discharge/monitor patient progress toward functional and medical goals.  Working on Leisure centre manager with therapies. Family working on home mods, etc.  Mobility: Bed Mobility Bed Mobility: Yes Rolling Right: 3: Mod assist Supine to Sit: 2: Max assist Transfers Transfers: Yes Lateral/Scoot Transfers: 4: Min assist Ambulation/Gait Stairs: No Architect: Yes Wheelchair Assistance: 6: Modified independent (Device/Increase time) Environmental health practitioner: Power Wheelchair Parts Management: Independent Distance: >200 ADL:   Cognition: Cognition Overall Cognitive Status: Appears within functional limits for tasks assessed Arousal/Alertness: Awake/alert Orientation Level: Oriented X4 Safety/Judgment: Appears intact Cognition Arousal/Alertness: Awake/alert Orientation Level: Oriented X4   Medical Problem List and Plan:  1. DVT Prophylaxis/Anticoagulation: Pharmaceutical: Lovenox. Patients hgb stable and will initiate lovenox   2. Pain Management:  Continue prn vicodin. No complaints at present.  Asked patient to back off weights a bit and work on simple postural/core  exercises.  3. Mood: No active issues.  4. UTI/Bladder: treated   -cathing qid to keep volumes less than 500cc  5. ABLA: iron supplement. Following HGB. Trending up/stable. hgb slowly moving up.  6. Sacral decub and bilateral heel blisters: Continue air mattress overlay. Order additional prevalon boot and use both when in bed.    -hydrotherapy to sacrum with collagenase--should be ready for home care by end of week  Appreciate PT help  -diet enhancement  7. MS with spasticity: No meds currently. Baclofen seems to have helped spasms  8. Diabetes Mellitus: Continue Amaryl and actos. Monitor BS with ac/hs cbg checks and use SSI for elevated BS.   CBG's  Under reasonable control  9. Neurogenic Bowel:  Augment bowel meds.  -QOD suppository working well.    LOS (Days) 15  Esa Raden T 04/21/2011, 7:31 AM     A FACE TO FACE EVALUATION WAS PERFORMED

## 2011-04-21 NOTE — Progress Notes (Signed)
Scoot/pivot transferred from mechanical scooter to bed with minium assist . Assistance fluctuates depending on pt's fatigue level,  ( i.e can be from stand-by assist to minium assist). Bilateral leg straps for increased mobility. Continuing to self cath q 5-6hrs. Last bowel movement on 04/20/11. Incontinent x 1 in brief, due to urgency.  R shin wound healed. R heel blister healing appropriately. Black blister to L heel, with allevyn drsg intact. Receiving hydrotherapy to stage 2 on sacrum.

## 2011-04-21 NOTE — Progress Notes (Addendum)
Physical Therapy Weekly Progress Note  Patient Details  Name: IMER ANTONETTI MRN: [id number] Date of Birth: Nov 28, 1940  Today's Date: 04/21/2011  Short term goals = long term goals due to d/c date this week.  Patient continues to demonstrate the following deficits: decreased activity tolerance, decreased postural control, decreased balance, decreased strength, motor control, abnormal tone and therefore will continue to benefit from skilled PT intervention to enhance overall performance with activity tolerance, balance, postural control, ability to compensate for deficits and coordination.  Family ed to be completed this week prior to d/c. Pt functioning at overall minA/supervision level for mobility. Pt making good use of leg loops and practicing with slideboard for car transfer. Uneven transfers will continue to be focus this week.  See Patient's Care Plan for progression toward long term goals.  Patient progressing toward long term goals..  Continue plan of care.   Canary Brim [facility] 04/21/2011, 4:35 PM

## 2011-04-21 NOTE — Progress Notes (Signed)
Physical Therapy Wound Treatment Patient Details  Name: Lance [name] MRN: [id number] Date of Birth: Mar 04, 1941  Today's Date: 04/21/2011 Time: [id number] Time Calculation (min): 22 min  Subjective  Subjective: No c/o's  Pain Score:    Wound Assessment  Pressure Ulcer 03/30/11 Stage II -  Partial thickness loss of dermis presenting as a shallow open ulcer with a red, pink wound bed without slough. (Active)  State of Healing Early/partial granulation 04/21/2011  1:47 PM  Site / Wound Assessment Pink;Yellow 04/21/2011  1:47 PM  % Wound base Red or Granulating 50% 04/21/2011  1:47 PM  % Wound base Yellow 50% 04/21/2011  1:47 PM  % Wound base Black 0% 04/12/2011  8:30 AM  % Wound base Other (Comment) 100% 03/31/2011  8:40 AM  Peri-wound Assessment Intact 04/21/2011  1:47 PM  Wound Length (cm) 6 cm 04/20/2011  2:07 PM  Wound Width (cm) 2 cm 04/20/2011  2:07 PM  Wound Depth (cm) 0.1 cm 04/20/2011  2:07 PM  Drainage Amount Minimal 04/21/2011  1:47 PM  Drainage Description Serous 04/21/2011  1:47 PM  Treatment Hydrotherapy (Pulse lavage);Debridement (Selective) 04/21/2011  1:47 PM  Dressing Type Moist to dry;Barrier Film (skin prep);Foam 04/21/2011  1:47 PM  Dressing Changed 04/21/2011  1:47 PM  Hydrotherapy Pulsed lavage therapy - wound location: rt. buttock, gluteal fold Pulsed Lavage with Suction (psi):  (4-8 psi) Pulsed Lavage with Suction - Normal Saline Used: 1000 mL Pulsed Lavage Tip: Tip with splash shield Selective Debridement Selective Debridement - Location: gluteal fold Selective Debridement - Tools Used: Forceps;Scissors Selective Debridement - Tissue Removed: yellow slough   Wound Assessment and Plan  Wound Therapy - Assess/Plan/Recommendations Wound Therapy - Clinical Statement: Wound continues to improve. Wound Therapy - Follow Up Recommendations: Home health RN  Wound Therapy Goals- Improve the function of patient's integumentary system by progressing the  wound(s) through the phases of wound healing (inflammation - proliferation - remodeling) by: Decrease Necrotic Tissue - Progress: Progressing toward goal Increase Granulation Tissue - Progress: Progressing toward goal  Goals will be updated until maximal potential achieved or discharge criteria met.  Discharge criteria: when goals achieved, discharge from hospital, MD decision/surgical intervention, no progress towards goals, refusal/missing three consecutive treatments without notification or medical reason.  Lance [name] 04/21/2011, 1:50 PM  [place]

## 2011-04-21 NOTE — Progress Notes (Signed)
Physical Therapy Session Note  Patient Details  Name: Lance [name] MRN: [id number] Date of Birth: 1940/06/19  Today's Date: 04/21/2011 Time: 1125-1210 Time Calculation (min): 45 min  Precautions: Precautions Precautions: Fall Required Braces or Orthoses: No Restrictions Weight Bearing Restrictions: No Short Term Goals: PT Short Term Goal 1: Pt will be able to perform bed mobility with minA PT Short Term Goal 2: Pt will be able to perform functional transfers with modA PT Short Term Goal 3: Pt will be able to propel w/c x 150 with supervision in controlled environment for UE strengthening PT Short Term Goal 4: Pt will be able to maintain dynamic sitting balance with steady A during functional tasks  Skilled Therapeutic Interventions/Progress Updates: personal scooter>< Nu-Step transfers, level, with close S.  Leg management while on Nu-Step with min A.  Therapeutic ex on Nu Step at level 4 x 13 minutes, using bil UEs and bil LEs, rated 11 on Borg.  Exertion purposely limited to prevent muslce over-use/exhaustion.  Stood in standing frame x 15 min, intermittently wt shifting L<>R for muscle activation, and terminal hip extension, x 8 reps, using bil UES.  Pt c/o fatigue, need to sit. Pt mod I using personal scooter in controlled environment, to/from room, 150'.       Vital Signs Therapy Vitals Pulse Rate: 108  (with exertion) BP: 138/68 mmHg (upon standing in standing frame) Pain Pain Assessment Pain Assessment: No/denies pain      Therapy/Group: Individual Therapy  Lynden Flemmer 04/21/2011, 3:43 PM

## 2011-04-21 NOTE — Patient Care Conference (Signed)
Inpatient RehabilitationTeam Conference Note Date: 04/21/2011   Time: 6:26 PM    Patient Name: Lance [name]      Medical Record Number: [id number]  Date of Birth: 1940/06/19 Sex: Male         Room/Bed: 4007/4007-01 Payor Info: Payor: MEDICARE  Plan: MEDICARE PART A AND B  Product Type: *No Product type*     Admitting Diagnosis: MS DECONDITIONED  Admit Date/Time:  04/06/2011  4:07 PM Admission Comments: No comment available   Primary Diagnosis:  <principal problem not specified> Principal Problem: <principal problem not specified>  Patient Active Problem List  Diagnoses Date Noted  . MS (multiple sclerosis) pseudoexacerbation 04/07/2011  . Anemia associated with acute blood loss due to retro/intrapertitoneal hematomas 04/07/2011  . Acute renal failure (ARF) 04/02/2011    Class: Acute  . Multiple sclerosis 03/30/2011  . Diabetes mellitus 03/30/2011  . UTI (lower urinary tract infection) 03/30/2011  . Sacral decubitus ulcer 03/30/2011  . Ulcer of ankle 03/30/2011  . Anemia 03/30/2011  . Nephrolithiasis 03/24/2011    Expected Discharge Date: Expected Discharge Date: 04/24/11  Team Members Present: Physician: Dr. Alger Simons Case Manager Present: Lutricia Feil, RN Social Worker Present: Lennart Pall, LCSW PT Present: Altamese Dilling, PTA;Canary Brim, PT OT Present: Chrys Racer, OT Other (Discipline and Name): Daiva Nakayama, PPS Coordinator RN Present: Janyth Pupa    Current Status/Progress Goal Weekly Team Focus  Medical   sacral wound showing nice improvement, hgb improving.  have sacral wound ready for home care,  adeqate nutrition, increase stamina  increased activity tolerance   Bowel/Bladder   Pt performing sefl cath q 6hrs. Receiving suppository for bowel aid  Regular bowel movement  Monitory bowel pattern   Swallow/Nutrition/ Hydration             ADL's   Overall supervision -> minimal assist  Overall supervision  Transfers, sit to stands, UB strengthening,  dynamic sitting   Mobility   overall supervision to minA  overall supervision, minA car tx, mod I scooter mobility  car transfer, activity tolerance, family ed, lower extremity strengthening   Communication             Safety/Cognition/ Behavioral Observations            Pain   No complaint of pain  <3  Monitor frequency of pain med requests   Skin   Daily hydrotherapy to sacral wound. allevyn drsg to bilateral blisters, right shin and posterior thigh wound  Family education  Family to demonstrated drsg changes      *See Interdisciplinary Assessment and Plan and progress notes for long and short-term goals  Barriers to Discharge: persistent spasticity and weakness    Possible Resolutions to Barriers:  adaptive equipment training, home mods, family and patient ed    Discharge Planning/Teaching Needs:  Home with wife and son - no change in plan      Team Discussion: Wound healing. Pt doing self caths now as at home PTA.  Need wife in for fam ed.   Revisions to Treatment Plan: none    Continued Need for Acute Rehabilitation Level of Care: The patient requires daily medical management by a physician with specialized training in physical medicine and rehabilitation for the following conditions: Daily direction of a multidisciplinary physical rehabilitation program to ensure safe treatment while eliciting the highest outcome that is of practical value to the patient.: Yes Daily medical management of patient stability for increased activity during participation in an intensive rehabilitation regime.: Yes  Daily analysis of laboratory values and/or radiology reports with any subsequent need for medication adjustment of medical intervention for : Neurological problems;Other  Pt & wife state understanding of supervision as d/c goal. Wife will come in the morning of 12/21[d/c day] for family ed-times to be arranged  Theora Master 04/21/2011, 6:26 PM

## 2011-04-21 NOTE — Consult Note (Signed)
Wound care follow-up:  Left heell has evolved into stage 2  1X1X.1cm 100% pink and dry.  Continue foam dressing to protect and promote healing.  Sacrum improved with decreased amt slough.  Refer to PT notes for percentage.  Contiune hydrotherapy until d/c, then santyl to chemically debride after d/c home. Will not plan to follow further unless re-consulted.  [address] [place], MSN, [place]

## 2011-04-22 LAB — GLUCOSE, CAPILLARY

## 2011-04-22 NOTE — Progress Notes (Signed)
Pt alert and very pleasant. Schedule D/C 12/21. Min asst.to stand and pivot. Self cath Q 6 hours. No complaints of pain voiced at this time. Medication taken whole/ water. Dressing to heel and sacrum intact. Pt requested that they not be removed at this time. Pt uses motorized scooter to move about unit. Last BM 04/23/11. Continue with plan of care.

## 2011-04-22 NOTE — Progress Notes (Signed)
Occupational Therapy Session Note  Patient Details  Name: Lance [name] MRN: [id number] Date of Birth: 1940/07/11  Today's Date: 04/22/2011 Time: 1030-1130 Time Calculation (min): 60 min  Precautions: Precautions Precautions: Fall Required Braces or Orthoses: No Restrictions Weight Bearing Restrictions: No  Short Term Goals: OT Short Term Goal 1: Patient will perform UB dressing sitting EOB with supervision (LTG) OT Short Term Goal 1 - Progress: Met OT Short Term Goal 2: Patient will bathe 10/10 body parts with moderate assistance OT Short Term Goal 2 - Progress: Met OT Short Term Goal 3: Patient will perform toilet transfer with maximal assistance OT Short Term Goal 3 - Progress: Met OT Short Term Goal 4: Patient will perform LB dressing with moderate assistance OT Short Term Goal 4 - Progress: Met  Skilled Therapeutic Interventions/Progress Updates:    Skilled intervention focusing on discharge planning; discussing and educating patient on shower stall tranfers. Discharged shower transfer goal secondary to limited time secondary to energy conservation; patient feels like with as much therapy as he's getting he needs to conserve his energy. At home patient plans to shower at night so wife can provide supervision for transfer and bathing and patient can take time and conserve energy. Also adjusted personal BSC and tub transfer bench to right height for safe transfers <->. Completed shoulder and core/trunk exercises seated in scooter using 2lb weights; patient independent with exercises.   Pain Pain Assessment Pain Assessment: No/denies pain Pain Score: 0-No pain   Therapy/Group: Individual Therapy  Marisha Renier 04/22/2011, 12:43 PM

## 2011-04-22 NOTE — Progress Notes (Signed)
Physical Therapy Note  Patient Details  Name: Lance [name] MRN: [id number] Date of Birth: 12-22-1940 Today's Date: 04/22/2011  830-930 Individual therapy. Complaint of "a little discomfort" in back but states it was from exercising yesterday. Premedicated. Bed mobility and lower body dressing retraining seated EOB to work on dynamic balance with functional task and multiple sidelying to sit for lateral leans to pull pants up. Minimal assist required. Pt report urge to have BM, scoot transfer with minA to hold scooter (in brief only) from bed to scooter. Required modA for scoot onto elevated toilet seat, difficulty with good positioning of scooter due to uneven surface in bathroom and toiletpaper dispenser location.  Required +2 assist for transfer back to scooter due to fatigue and assist to get pants pulled back up.  [name] 04/22/2011, 8:46 AM

## 2011-04-22 NOTE — Progress Notes (Signed)
Patient ID: Lance [name], male   [name]: 12-09-1940, 70 y.o.   MRN: [id number] Patient ID: Lance [name], male   [name]: 05-15-1940, 70 y.o.   MRN: [id number] Patient ID: Lance [name], male   [name]: 1941-01-21, 70 y.o.   MRN: [id number] Patient ID: Lance [name], male   [name]: November 25, 1940, 70 y.o.   MRN: [id number]  Subjective/Complaints:  Review of Systems  Gastrointestinal: Positive for constipation.  Musculoskeletal: Positive for joint pain.  Neurological: Positive for tingling.  All other systems reviewed and are negative.  no new problems  Objective: Vital Signs: Blood pressure 113/61, pulse 72, temperature 98.3 F (36.8 C), temperature source Oral, resp. rate 19, height 6\' 4"  (1.93 m), weight 91 kg (200 lb 9.9 oz), SpO2 97.00%. No results found.  Basename 04/21/11 0650  WBC 5.5  HGB 8.6*  HCT 26.6*  PLT 211    Basename 04/21/11 0650  NA 142  K 4.5  CL 110  CO2 24  GLUCOSE 91  BUN 40*  CREATININE 1.44*  CALCIUM 9.4   CBG (last 3)   Basename 04/22/11 0709 04/21/11 2026 04/21/11 1618  GLUCAP 97 140* 92    Wt Readings from Last 3 Encounters:  04/15/11 91 kg (200 lb 9.9 oz)  03/30/11 92.987 kg (205 lb)  03/24/11 92.987 kg (205 lb)    Physical Exam:  General appearance: alert and no distress Head: Normocephalic, without obvious abnormality, atraumatic Eyes: conjunctivae/corneas clear. PERRL, EOM's intact. Fundi benign. Ears: normal TM's and external ear canals both ears Nose: Nares normal. Septum midline. Mucosa normal. No drainage or sinus tenderness. Throat: lips, mucosa, and tongue normal; teeth and gums normal Neck: no adenopathy, no carotid bruit, no JVD, supple, symmetrical, trachea midline and thyroid not enlarged, symmetric, no tenderness/mass/nodules Back: symmetric, no curvature. ROM normal. No CVA tenderness. Resp: clear to auscultation bilaterally Cardio: regular rate and rhythm, S1, S2 normal, no murmur, click, rub or gallop GI:  soft, non-tender; bowel sounds normal; no masses,  no organomegaly Extremities: extremities normal, atraumatic, no cyanosis or edema  Pulses: 2+ and symmetric Skin: right calf breakdown.  bilateral heel blisters right greater than left , sacrum clean with decreased fibronecrotic tissue -50% pink granulation coming through. Neurologic: hyperreflexic, RLE 1-2/5  LLE 2/5. RUE 3-4/5, LUE 4/5.  Sensory 1+/2 especially in lower extremities.  Positive clonus. Stamina a little better.  Tone improved to trace to 1/4.  No substantial change here Incision/Wound: see above   Assessment/Plan: 1. Functional deficits secondary to MS with pseudoexacerbation related tor retroperitoneal bleed which require 3+ hours per day of interdisciplinary therapy in a comprehensive inpatient rehab setting. Physiatrist is providing close team supervision and 24 hour management of active medical problems listed below. Physiatrist and rehab team continue to assess barriers to discharge/monitor patient progress toward functional and medical goals.  Working on Leisure centre manager with therapies. Family working on home mods, etc.  Mobility: Bed Mobility Bed Mobility: Yes Rolling Right: 3: Mod assist Supine to Sit: 2: Max assist Transfers Transfers: Yes Lateral/Scoot Transfers: 4: Min assist Ambulation/Gait Stairs: No Architect: Yes Wheelchair Assistance: 6: Modified independent (Device/Increase time) Environmental health practitioner: Power Wheelchair Parts Management: Independent Distance: >200 ADL:   Cognition: Cognition Overall Cognitive Status: Appears within functional limits for tasks assessed Arousal/Alertness: Awake/alert Orientation Level: Oriented X4 Safety/Judgment: Appears intact Cognition Arousal/Alertness: Awake/alert Orientation Level: Oriented X4   Medical Problem List and Plan:  1. DVT Prophylaxis/Anticoagulation: Pharmaceutical: Lovenox. Patients hgb stable  and will  initiate lovenox   2. Pain Management: Continue prn vicodin. No complaints at present.  Asked patient to back off weights a bit and work on simple postural/core exercises.  3. Mood: No active issues.  4. Renal/Bladder: treated   -cathing qid to keep volumes less than 500cc  -baseline renal insufficiency  5. ABLA: iron supplement. Following HGB. Trending up/stable. hgb slowly moving up.  6. Sacral decub and bilateral heel blisters: Continue air mattress overlay. Order additional prevalon boot and use both when in bed.    -hydrotherapy to sacrum with collagenase--should be ready for home care by end of week  Appreciate PT help  -diet enhancement  7. MS with spasticity: No meds currently. Baclofen seems to have helped spasms  8. Diabetes Mellitus: Continue Amaryl and actos. Monitor BS with ac/hs cbg checks and use SSI for elevated BS.   CBG's much improved  9. Neurogenic Bowel:  Augment bowel meds.  -QOD suppository working well.    LOS (Days) 16  [name],[name] 04/22/2011, 7:13 AM     A FACE TO FACE EVALUATION WAS PERFORMED

## 2011-04-22 NOTE — Progress Notes (Signed)
Physical Therapy Wound Treatment Patient Details  Name: Lance [name] MRN: [id number] Date of Birth: 08/08/1940  Today's Date: 04/22/2011 Time: [id number] Time Calculation (min): 21 min  Subjective  Subjective: No c/o's  Pain Score:    Wound Assessment  Pressure Ulcer 03/30/11 Stage II -  Partial thickness loss of dermis presenting as a shallow open ulcer with a red, pink wound bed without slough. (Active)  State of Healing Early/partial granulation 04/22/2011  1:58 PM  Site / Wound Assessment Pink;Yellow 04/22/2011  1:58 PM  % Wound base Red or Granulating 50% 04/22/2011  1:58 PM  % Wound base Yellow 50% 04/22/2011  1:58 PM  % Wound base Black 0% 04/12/2011  8:30 AM  % Wound base Other (Comment) 0% 03/31/2011  8:40 AM  Peri-wound Assessment Intact 04/22/2011  1:58 PM  Wound Length (cm) 6 cm 04/20/2011  2:07 PM  Wound Width (cm) 2 cm 04/20/2011  2:07 PM  Wound Depth (cm) 0.1 cm 04/20/2011  2:07 PM  Drainage Amount Minimal 04/22/2011  1:58 PM  Drainage Description Serous 04/22/2011  1:58 PM  Treatment Hydrotherapy (Pulse lavage) 04/22/2011  1:58 PM  Dressing Type Moist to dry;Foam;Barrier Film (skin prep) 04/22/2011  1:58 PM  Dressing Changed 04/22/2011  1:58 PM  Hydrotherapy Pulsed lavage therapy - wound location: rt. buttock, gluteal fold Pulsed Lavage with Suction (psi): 8 psi Pulsed Lavage with Suction - Normal Saline Used: 1000 mL Pulsed Lavage Tip: Tip with splash shield   Wound Assessment and Plan  Wound Therapy - Assess/Plan/Recommendations Wound Therapy - Clinical Statement: Steady progress. Wound Therapy - Follow Up Recommendations: Home health RN  Wound Therapy Goals- Improve the function of patient's integumentary system by progressing the wound(s) through the phases of wound healing (inflammation - proliferation - remodeling) by: Decrease Necrotic Tissue - Progress: Progressing toward goal Increase Granulation Tissue - Progress: Progressing toward  goal  Goals will be updated until maximal potential achieved or discharge criteria met.  Discharge criteria: when goals achieved, discharge from hospital, MD decision/surgical intervention, no progress towards goals, refusal/missing three consecutive treatments without notification or medical reason.  Maurianna Benard 04/22/2011, 2:02 PM [employer] PT [phone]

## 2011-04-22 NOTE — Progress Notes (Signed)
Physical Therapy Note  Patient Details  Name: DASHIEL WESSMAN MRN: [id number] Date of Birth: 12/17/1940 Today's Date: 04/22/2011  Individual therapy 1515-1545 (30 minutes)  Denies pain. Pt participated in holiday party activity for general activity tolerance, scooter mobility, and functional tasks in social setting. Pt enjoyed activities. Completed at supervision level.   Canary Brim [facility] 04/22/2011, 4:11 PM

## 2011-04-23 LAB — GLUCOSE, CAPILLARY
Glucose-Capillary: 91 mg/dL (ref 70–99)
Glucose-Capillary: 175 mg/dL — ABNORMAL HIGH (ref 70–99)

## 2011-04-23 NOTE — Progress Notes (Signed)
Subjective/Complaints:  Review of Systems  Gastrointestinal: Positive for constipation.  Musculoskeletal: Positive for joint pain.  Neurological: Positive for tingling.  All other systems reviewed and are negative.  no new problems  Objective: Vital Signs: Blood pressure 132/67, pulse 85, temperature 98.9 F (37.2 C), temperature source Oral, resp. rate 19, height 6\' 4"  (1.93 m), weight 91 kg (200 lb 9.9 oz), SpO2 97.00%. No results found.  Basename 04/21/11 0650  WBC 5.5  HGB 8.6*  HCT 26.6*  PLT 211    Basename 04/21/11 0650  NA 142  K 4.5  CL 110  CO2 24  GLUCOSE 91  BUN 40*  CREATININE 1.44*  CALCIUM 9.4   CBG (last 3)   Basename 04/23/11 0708 04/22/11 1645 04/22/11 1119  GLUCAP 91 92 193*    Wt Readings from Last 3 Encounters:  04/15/11 91 kg (200 lb 9.9 oz)  03/30/11 92.987 kg (205 lb)  03/24/11 92.987 kg (205 lb)    Physical Exam:  General appearance: alert and no distress Head: Normocephalic, without obvious abnormality, atraumatic Eyes: conjunctivae/corneas clear. PERRL, EOM's intact. Fundi benign. Ears: normal TM's and external ear canals both ears Nose: Nares normal. Septum midline. Mucosa normal. No drainage or sinus tenderness. Throat: lips, mucosa, and tongue normal; teeth and gums normal Neck: no adenopathy, no carotid bruit, no JVD, supple, symmetrical, trachea midline and thyroid not enlarged, symmetric, no tenderness/mass/nodules Back: symmetric, no curvature. ROM normal. No CVA tenderness. Resp: clear to auscultation bilaterally Cardio: regular rate and rhythm, S1, S2 normal, no murmur, click, rub or gallop GI: soft, non-tender; bowel sounds normal; no masses,  no organomegaly Extremities: extremities normal, atraumatic, no cyanosis or edema  Pulses: 2+ and symmetric Skin: right calf breakdown.  bilateral heel blisters right greater than left , sacrum clean with decreased fibronecrotic tissue -75+% pink granulation now.  Right heel almost  completely healed.  Left heal re-epithelializing Neurologic: hyperreflexic, RLE 1-2/5  LLE 2/5. RUE 3-4/5, LUE 4/5.  Sensory 1+/2 especially in lower extremities.  Positive clonus. Stamina a little better.  Tone improved to trace to 1/4.  No substantial change here Incision/Wound: see above   Assessment/Plan: 1. Functional deficits secondary to MS with pseudoexacerbation related tor retroperitoneal bleed which require 3+ hours per day of interdisciplinary therapy in a comprehensive inpatient rehab setting. Physiatrist is providing close team supervision and 24 hour management of active medical problems listed below. Physiatrist and rehab team continue to assess barriers to discharge/monitor patient progress toward functional and medical goals.  Working on Leisure centre manager with therapies. Family working on home mods, etc. Reviewed post-discharge issues and followup with the pt.  Mobility: Bed Mobility Bed Mobility: Yes Rolling Right: 3: Mod assist Supine to Sit: 2: Max assist Transfers Transfers: Yes Lateral/Scoot Transfers: 4: Min assist Ambulation/Gait Stairs: No Architect: Yes Wheelchair Assistance: 6: Modified independent (Device/Increase time) Environmental health practitioner: Power Wheelchair Parts Management: Independent Distance: >200 ADL:   Cognition: Cognition Overall Cognitive Status: Appears within functional limits for tasks assessed Arousal/Alertness: Awake/alert Orientation Level: Oriented X4 Safety/Judgment: Appears intact Cognition Arousal/Alertness: Awake/alert Orientation Level: Oriented X4   Medical Problem List and Plan:  1. DVT Prophylaxis/Anticoagulation: Pharmaceutical: Lovenox. Patients hgb stable and will initiate lovenox   2. Pain Management: Continue prn vicodin. No complaints at present.  Asked patient to back off weights a bit and work on simple postural/core exercises.  3. Mood: No active issues.  4.  Renal/Bladder: treated   -cathing qid to keep volumes less than  500cc  -baseline renal insufficiency  5. ABLA: iron supplement. Following HGB. Trending up/stable. hgb slowly moving up.  6. Sacral decub and bilateral heel blisters: Continue air mattress overlay. Order additional prevalon boot and use both when in bed.    -hydrotherapy to sacrum with collagenase has helped immensely.  Home health f/u of wound. Can go home with wet to dry dressing.  -diet enhancement  7. MS with spasticity: No meds currently. Baclofen seems to have helped spasms  8. Diabetes Mellitus: Continue Amaryl and actos. Monitor BS with ac/hs cbg checks and use SSI for elevated BS.   CBG's much improved  9. Neurogenic Bowel:  Augment bowel meds.  -QOD suppository working well.    LOS (Days) 17  Lance [name] T 04/23/2011, 7:12 AM     A FACE TO FACE EVALUATION WAS PERFORMED

## 2011-04-23 NOTE — Progress Notes (Signed)
Physical Therapy Note  Patient Details  Name: Lance [name] MRN: [id number] Date of Birth: 1940/10/12 Today's Date: 04/23/2011  Individual therapy initiated at 30 but pt on toilet still having a bowel movement. Missed first 45 minutes due to this. Last 15 min (1045-1100) worked on lateral leans to get pants pulled up and transfer from elevated toilet seat to scooter with close supervision. Required assist to get pants pulled all the way up in back with pt lifting up on arm rests.  Will try to see pt later this afternoon if schedule permits to make up 45 minutes missed.    Canary Brim [facility] 04/23/2011, 11:58 AM

## 2011-04-23 NOTE — Progress Notes (Signed)
Pt alert and oriented.  Pt out of bed to scooter today.  Pt straight cath self every 5 hours.  Pt denies pain. Katharine Look

## 2011-04-23 NOTE — Progress Notes (Signed)
Occupational Therapy Note  Patient Details  Name: Lance [name] MRN: [id number] Date of Birth: 12/04/1940 Today's Date: 04/23/2011  Found patient supine in bed. Supine to sit with supervision using arm rails. Sat edge of bed to donn bilateral shoes. Nursing notified to change dressing for left heal. Transferred from edge of bed -> scooter with supervision (therapist holding scooter in place). Then set-up tub transfer bench for simulated shower transfer from scooter. Patient able to complete transfer with supervision (therapist again holding scooter and bench). Also discussed discharge planning and requirements/needs for 24/7 supervision.   Cruzita Lipa 04/23/2011, 3:03 PM

## 2011-04-23 NOTE — Progress Notes (Signed)
Occupational Therapy Session Note  Patient Details  Name: BOYD MAGSINO MRN: [id number] Date of Birth: 1940/06/13  Today's Date: 04/23/2011 Time: [id number] Time Calculation (min): 70 min  Precautions: Precautions Precautions: Fall Required Braces or Orthoses: No Restrictions Weight Bearing Restrictions: No  Short Term Goals: OT Short Term Goal 1: Patient will perform UB dressing sitting EOB with supervision (LTG) OT Short Term Goal 1 - Progress: Met OT Short Term Goal 2: Patient will bathe 10/10 body parts with moderate assistance OT Short Term Goal 2 - Progress: Met OT Short Term Goal 3: Patient will perform toilet transfer with maximal assistance OT Short Term Goal 3 - Progress: Met OT Short Term Goal 4: Patient will perform LB dressing with moderate assistance OT Short Term Goal 4 - Progress: Met  Skilled Therapeutic Interventions/Progress Updates:    Patient found supine in bed. Set up assist for LB bathing in supine position. Patient then engaged in bed mobility for brief to be donned. Supervision for supine -> sit edge of bed for LB dressing. Compensatory strategies taught to help facilitate lower extremities during bathing and dressing routine; to help increase independence with bed mobility and dressing. Lateral leans to donn pants to waist. Edge of bed -> scooter scoot transfer with supervision; therapist holding scooter in place. Also completed toilet transfer and toileting in room bathroom on drop arm bed side commode.   Pain Pain Assessment Pain Score: 0-No pain  Therapy/Group: Individual Therapy  Waris Rodger 04/23/2011, 11:27 AM

## 2011-04-23 NOTE — Progress Notes (Signed)
Physical Therapy Wound Treatment Patient Details  Name: Lance [name] MRN: [id number] Date of Birth: 1940/08/07  Today's Date: 04/23/2011 Time: [id number] Time Calculation (min): 25 min  Subjective  Subjective: Pt states he is eager to go home tomorrow.  Pain Score:    Wound Assessment  Pressure Ulcer 03/30/11 Stage II -  Partial thickness loss of dermis presenting as a shallow open ulcer with a red, pink wound bed without slough. (Active)  State of Healing Early/partial granulation 04/23/2011  2:39 PM  Site / Wound Assessment Pink;Yellow 04/23/2011  2:39 PM  % Wound base Red or Granulating 50% 04/23/2011  2:39 PM  % Wound base Yellow 50% 04/23/2011  2:39 PM  % Wound base Black 0% 04/12/2011  8:30 AM  % Wound base Other (Comment) 100% 03/31/2011  8:40 AM  Peri-wound Assessment Intact 04/23/2011  2:39 PM  Wound Length (cm) 6 cm 04/20/2011  2:07 PM  Wound Width (cm) 2 cm 04/20/2011  2:07 PM  Wound Depth (cm) 0.1 cm 04/20/2011  2:07 PM  Drainage Amount Minimal 04/23/2011  2:39 PM  Drainage Description Serous 04/23/2011  2:39 PM  Treatment Hydrotherapy (Pulse lavage) 04/23/2011  2:39 PM  Dressing Type Moist to dry;Foam 04/23/2011  2:39 PM  Dressing Intact 04/23/2011  2:39 PM     Hydrotherapy Pulsed lavage therapy - wound location: rt. buttock, gluteal fold Pulsed Lavage with Suction (psi): 8 psi Pulsed Lavage with Suction - Normal Saline Used: 1000 mL Pulsed Lavage Tip: Tip with splash shield   Wound Assessment and Plan  Wound Therapy - Assess/Plan/Recommendations Wound Therapy - Clinical Statement: Steady progress. Wound Therapy - Follow Up Recommendations: Home health RN  Wound Therapy Goals- Improve the function of patient's integumentary system by progressing the wound(s) through the phases of wound healing (inflammation - proliferation - remodeling) by: Decrease Necrotic Tissue - Progress: Progressing toward goal Increase Granulation Tissue - Progress: Progressing  toward goal  Goals will be updated until maximal potential achieved or discharge criteria met.  Discharge criteria: when goals achieved, discharge from hospital, MD decision/surgical intervention, no progress towards goals, refusal/missing three consecutive treatments without notification or medical reason.  Toussaint Golson 04/23/2011, 2:41 PM  [facility] PT [phone]

## 2011-04-23 NOTE — Progress Notes (Signed)
Physical Therapy Note  Patient Details  Name: Lance [name] MRN: [id number] Date of Birth: 01-17-1941 Today's Date: 04/23/2011  Individual therapy 1500-1545. Denies pain. Simulated car transfer uphill with slideboard with minA, pt comfortable with this transfer for home tomorrow. Nustep for lower extremity strengthening and general activity tolerance for cardiovascular endurance x 12 min on level 5. HR prior = 99 bpm and 103 bpm following activity. Mod I for scooter mobility on the unit.   Canary Brim [facility] 04/23/2011, 3:53 PM

## 2011-04-23 NOTE — Progress Notes (Signed)
Per State Regulation 482.30 This chart was reviewed for medical necessity with respect to the patient's Admission/Duration of stay. Pt progressing well toward d/c tomorrow.  Family ed to be completed in am.  Bowel/bladder managed.  Will need sacral wound f/up-plan HH RN.   Theora Master                 Nurse Care Manager              Next Review Date: none

## 2011-04-24 DIAGNOSIS — G35 Multiple sclerosis: Secondary | ICD-10-CM

## 2011-04-24 DIAGNOSIS — Z5189 Encounter for other specified aftercare: Secondary | ICD-10-CM

## 2011-04-24 LAB — GLUCOSE, CAPILLARY: Glucose-Capillary: 99 mg/dL (ref 70–99)

## 2011-04-24 MED ORDER — TAMSULOSIN HCL 0.4 MG PO CAPS: 0.4000 mg | ORAL_CAPSULE | ORAL | Status: DC

## 2011-04-24 MED ORDER — BACLOFEN 10 MG PO TABS: 10.0000 mg | ORAL_TABLET | Freq: Three times a day (TID) | ORAL | Status: AC

## 2011-04-24 MED ORDER — POLYETHYLENE GLYCOL 3350 17 G PO PACK: 17.0000 g | PACK | Freq: Every day | ORAL | Status: AC

## 2011-04-24 NOTE — Progress Notes (Signed)
Social Work  Discharge Note  The overall goal for the admission was met for:   Discharge location: Yes - home with wife and son  Length of Stay: Yes -18 days  Discharge activity level: Yes- supervision overall from scooter level  Home/community participation: Yes  Services provided included: MD, RD, PT, OT, RN, CM, TR, Pharmacy and [place]: Medicare and Other: [employer]  Follow-up services arranged: Home Health: RN and PT via [place], DME: [employer] commode and tub bench via [place] and Patient/Family request agency HH: [place], DME: [facility]  Comments (or additional information):  Patient/Family verbalized understanding of follow-up arrangements: Yes  Individual responsible for coordination of the follow-up plan: patient  Confirmed correct DME delivered: Aleen Marston 04/24/2011    Cylis Ayars

## 2011-04-24 NOTE — Progress Notes (Signed)
Physical Therapy Wound Treatment Patient Details  Name: RAYON RUDKIN MRN: [id number] Date of Birth: 1940-11-10  Today's Date: 04/24/2011 Time: 0820-0851 Time Calculation (min): 31 min  Subjective  Subjective: Pt verbalized understanding of dressing changes.  Pain Score:    Wound Assessment  Pressure Ulcer 03/30/11 Stage II -  Partial thickness loss of dermis presenting as a shallow open ulcer with a red, pink wound bed without slough. (Active)  State of Healing Early/partial granulation 04/24/2011  9:58 AM  Site / Wound Assessment Pink;Yellow 04/24/2011  9:58 AM  % Wound base Red or Granulating 50% 04/24/2011  9:58 AM  % Wound base Yellow 50% 04/24/2011  9:58 AM  % Wound base Black 0% 04/12/2011  8:30 AM  Peri-wound Assessment Intact 04/23/2011  2:39 PM  Wound Length (cm) 6 cm 04/20/2011  2:07 PM  Wound Width (cm) 2 cm 04/20/2011  2:07 PM  Wound Depth (cm) 0.1 cm 04/20/2011  2:07 PM  Drainage Amount Minimal 04/24/2011  9:58 AM  Drainage Description Serous 04/24/2011  9:58 AM  Treatment Hydrotherapy (Pulse lavage) 04/24/2011  9:58 AM  Dressing Type Moist to dry;Barrier Film (skin prep);Foam 04/24/2011  9:58 AM  Dressing Changed 04/24/2011  9:58 AM     Hydrotherapy Pulsed lavage therapy - wound location: rt. buttock, gluteal fold Pulsed Lavage with Suction (psi): 8 psi Pulsed Lavage with Suction - Normal Saline Used: 1000 mL Pulsed Lavage Tip: Tip with splash shield   Wound Assessment and Plan  Wound Therapy - Assess/Plan/Recommendations Wound Therapy - Clinical Statement: Steady progress.  Explained to pt and wife process of dressing change including written instructions.  Both verbalized understanding. Wound Therapy - Follow Up Recommendations: Home health RN  Wound Therapy Goals- Improve the function of patient's integumentary system by progressing the wound(s) through the phases of wound healing (inflammation - proliferation - remodeling) by: Decrease Necrotic  Tissue - Progress: Not met Increase Granulation Tissue - Progress: Not met  Goals will be updated until maximal potential achieved or discharge criteria met.  Discharge criteria: when goals achieved, discharge from hospital, MD decision/surgical intervention, no progress towards goals, refusal/missing three consecutive treatments without notification or medical reason.  Valary Manahan 04/24/2011, 10:01 AM  Suanne Marker PT [phone]

## 2011-04-24 NOTE — Progress Notes (Signed)
Discharge summary [phone]

## 2011-04-24 NOTE — Progress Notes (Signed)
Pt discharged home with wife, discharge instructions provided by the PA.  Pt and wife verbalized an understanding Lance [name]

## 2011-04-24 NOTE — Progress Notes (Signed)
Patient ID: Lance [name], male   [name]: [birth year], 70 y.o.   MRN: [id number]  Subjective/Complaints:  Review of Systems  Gastrointestinal: Positive for constipation.  Musculoskeletal: Positive for joint pain.  Neurological: Positive for tingling.  All other systems reviewed and are negative.  no new problems  Objective: Vital Signs: Blood pressure 117/63, pulse 81, temperature 98.4 F (36.9 C), temperature source Oral, resp. rate 19, height 6\' 4"  (1.93 m), weight 96.979 kg (213 lb 12.8 oz), SpO2 96.00%. No results found. No results found for this basename: WBC:2,HGB:2,HCT:2,PLT:2 in the last 72 hours No results found for this basename: NA:2,K:2,CL:2,CO2:2,GLUCOSE:2,BUN:2,CREATININE:2,CALCIUM:2 in the last 72 hours CBG (last 3)   Basename 04/24/11 0730 04/23/11 2114 04/23/11 1141  GLUCAP 99 175* 97    Wt Readings from Last 3 Encounters:  04/23/11 96.979 kg (213 lb 12.8 oz)  03/30/11 92.987 kg (205 lb)  03/24/11 92.987 kg (205 lb)    Physical Exam:  General appearance: alert and no distress Head: Normocephalic, without obvious abnormality, atraumatic Eyes: conjunctivae/corneas clear. PERRL, EOM's intact. Fundi benign. Ears: normal TM's and external ear canals both ears Nose: Nares normal. Septum midline. Mucosa normal. No drainage or sinus tenderness. Throat: lips, mucosa, and tongue normal; teeth and gums normal Neck: no adenopathy, no carotid bruit, no JVD, supple, symmetrical, trachea midline and thyroid not enlarged, symmetric, no tenderness/mass/nodules Back: symmetric, no curvature. ROM normal. No CVA tenderness. Resp: clear to auscultation bilaterally Cardio: regular rate and rhythm, S1, S2 normal, no murmur, click, rub or gallop GI: soft, non-tender; bowel sounds normal; no masses,  no organomegaly Extremities: extremities normal, atraumatic, no cyanosis or edema  Pulses: 2+ and symmetric Skin: right calf breakdown.  bilateral heel blisters right greater than  left , sacrum clean with decreased fibronecrotic tissue -75+% pink granulation now.  Right heel almost completely healed.  Left heal re-epithelializing Neurologic: hyperreflexic, RLE 1-2/5  LLE 2/5. RUE 3-4/5, LUE 4/5.  Sensory 1+/2 especially in lower extremities.  Positive clonus. Stamina a little better.  Tone improved to trace to 1/4.  No substantial change here Incision/Wound: see above   Assessment/Plan: 1. Functional deficits secondary to MS with pseudoexacerbation related tor retroperitoneal bleed stable for D/C today.  See summary Mobility: Bed Mobility Bed Mobility: Yes Rolling Right: 3: Mod assist Supine to Sit: 2: Max assist Transfers Transfers: Yes Lateral/Scoot Transfers: 4: Min assist Ambulation/Gait Stairs: No Architect: Yes Wheelchair Assistance: 6: Modified independent (Device/Increase time) Environmental health practitioner: Power Wheelchair Parts Management: Independent Distance: >200 ADL:   Cognition: Cognition Overall Cognitive Status: Appears within functional limits for tasks assessed Arousal/Alertness: Awake/alert Orientation Level: Oriented X4 Safety/Judgment: Appears intact Cognition Arousal/Alertness: Awake/alert Orientation Level: Oriented X4   Medical Problem List and Plan:  1. DVT Prophylaxis/Anticoagulation: Pharmaceutical: Lovenox. Patients hgb stable and will initiate lovenox   2. Pain Management: Continue prn vicodin. No complaints at present.  Asked patient to back off weights a bit and work on simple postural/core exercises.  3. Mood: No active issues.  4. Renal/Bladder: treated   -cathing qid to keep volumes less than 500cc  -baseline renal insufficiency  5. ABLA: iron supplement. Following HGB. Trending up/stable. hgb slowly moving up.  6. Sacral decub and bilateral heel blisters: Continue air mattress overlay. Order additional prevalon boot and use both when in bed.    -hydrotherapy to sacrum with collagenase  has helped immensely.  Home health f/u of wound. Can go home with wet to dry dressing.  -diet enhancement  7. MS with  spasticity: No meds currently. Baclofen seems to have helped spasms  8. Diabetes Mellitus: Continue Amaryl and actos. Monitor BS with ac/hs cbg checks and use SSI for elevated BS.   CBG's much improved  9. Neurogenic Bowel:  Augment bowel meds.  -QOD suppository working well.    LOS (Days) 18  KIRSTEINS,ANDREW E 04/24/2011, 7:47 AM     A FACE TO FACE EVALUATION WAS PERFORMED

## 2011-04-24 NOTE — Progress Notes (Signed)
Physical Therapy Discharge Summary & Treatment  Patient Details  Name: Lance [name] MRN: [id number] Date of Birth: 08-26-1940 Today's Date: 04/24/2011  Individual therapy 1000-1045 (45 minutes), missed last 15 minutes due to toileting. Pt reporting needing to have BM. Focused on family education with pt's wife and son regarding basic transfers (Supervision), car transfer with slideboard ( minA), safety with mobility, use of leg loops, and recommendations for follow up therapy. Family and pt comfortable with pt's level of care. Supervision transfer from scooter to elevated toilet seat and pt pulled down pants independently.   Patient has met 5 of 5 long term goals due to improved activity tolerance, improved balance, improved postural control, increased strength, decreased pain and ability to compensate for deficits.  Patient to discharge at a wheelchair level Supervision.   Patient's care partner is independent to provide the necessary supervision assistance at discharge.  Recommendation:  Patient will benefit from ongoing skilled PT services in home health setting to continue to advance safe functional mobility, address ongoing impairments in balance, tone, strength, functional mobility, and minimize fall risk.  Equipment: No equipment provided. Pt already has scooter, slideboard, and [employer]  Patient/family agrees with progress made and goals achieved: Yes  Allayne Gitelman 04/24/2011, 10:59 AM

## 2011-04-24 NOTE — Progress Notes (Signed)
Occupational Therapy Discharge Summary & Session Note  Patient Details  Name: Lance [name] MRN: [id number] Date of Birth: 30-Dec-1940 Today's Date: 04/24/2011  Session Note  0900-1000 - 40 Minutes Individual Therapy No complaints of pain Skilled intervention focusing on family education. Engaged in bed mobility, w/c -> scooter scoot transfer, scooter <-> drop arm BSC transfer, and core/trunk exercises with dumbbells. All above verbalized and demonstrated with wife. Discussed safety at home and need for someone to hold scooter during transfers for safety.    Patient has met 7 of 7 long term goals due to improved activity tolerance, improved balance, postural control, ability to compensate for deficits, functional use of  RIGHT upper and LEFT upper extremity and improved coordination.  Patient's care partner is independent to provide the necessary physical and supervision assistance at discharge.  Education to wife, main caregiver, was completed on day of discharge 04/24/11.   See FIM and Discharge Navigator for objective & subjective measures.   Recommendation:  Patient will continue to received OT services in a home health setting to continue to advance functional skills in the area of ADL.  Equipment: Equipment provided: tub transfer bench and [facility]  Patient/family agrees with progress made and goals achieved: Yes  Kannan Proia 04/24/2011, 12:55 PM

## 2011-04-25 DIAGNOSIS — I10 Essential (primary) hypertension: Secondary | ICD-10-CM | POA: Diagnosis not present

## 2011-04-25 DIAGNOSIS — G35 Multiple sclerosis: Secondary | ICD-10-CM | POA: Diagnosis not present

## 2011-04-25 DIAGNOSIS — L8992 Pressure ulcer of unspecified site, stage 2: Secondary | ICD-10-CM | POA: Diagnosis not present

## 2011-04-25 DIAGNOSIS — E119 Type 2 diabetes mellitus without complications: Secondary | ICD-10-CM | POA: Diagnosis not present

## 2011-04-25 DIAGNOSIS — L89609 Pressure ulcer of unspecified heel, unspecified stage: Secondary | ICD-10-CM | POA: Diagnosis not present

## 2011-04-25 DIAGNOSIS — L89309 Pressure ulcer of unspecified buttock, unspecified stage: Secondary | ICD-10-CM | POA: Diagnosis not present

## 2011-04-25 NOTE — Discharge Summary (Signed)
NAME:  Lance [name], Lance [name] ACCOUNT NO.:  [id number]  MEDICAL RECORD NO.:  [id number]  LOCATION:  [id number]                         FACILITY:  [facility] [facility]  PHYSICIAN:  Meredith Staggers, M.D.DATE OF BIRTH:  01/13/1941  DATE OF ADMISSION:  04/06/2011 DATE OF DISCHARGE:  04/24/2011                              DISCHARGE SUMMARY   DISCHARGE DIAGNOSES: 1. Weakness and deconditioning due to retroperitoneal and perinephric     hematomas. 2. Acute blood loss anemia. 3. Multiple sclerosis exacerbation. 4. Sacral and bilateral heel decubitus. 5. Urinary tract infection, treated. 6. Spasticity. 7. Chronic renal insufficiency.  HISTORY OF PRESENT ILLNESS:  Mr. Lance [name] is a 70 year old male with neurogenic bladder due to multiple sclerosis.  He was recently admitted for lithotripsy on March 23, 2011, discharged next day, readmitted on March 30, 2011, with weakness and inability to perform his wheelchair transfers.  He was noted to be febrile in the ED with hemoglobin at 6.2.  CT of abdomen showed right perinephric hematoma and right retroperitoneal hematoma.  He was started on antibiotics for UTI and treated with 2 units of packed red blood cells.  He was noted to have acute renal failure due to hypertension and meds and was treated with IV fluids.  He was also noted to have sacral decubitus, being monitored by WOC.  He was on bedrest until Friday.  Therapies initiated and the patient is noted to be deconditioned.  He currently is working on balance at the edge of the bed.  Continues with Foley with recommendations for 10 total days of antibiotics.  The patient was evaluated by Rehab and we felt that he would benefit from a CIR program.  PAST MEDICAL HISTORY:  Positive for: 1. Hypertension. 2. Neuralgia with due to sciatica. 3. MS for 30 years. 4. Diabetes mellitus. 5. History of postop nausea and vomiting.  PAST SURGICAL HISTORY:  Positive for tonsillectomy and history of  finger surgery.  REVIEW OF SYSTEMS:  Positive for large abraded area on right buttock, bilateral heel ulcers, sensory deficits of bilateral lower extremities, paraparesis with right greater than left weakness, and neurogenic bladder requiring taps at least 3 times a day.  No complaints of shortness of breath, chest pain, or palpitations.  FAMILY HISTORY:  Noncontributory.  SOCIAL HISTORY:  The patient is married, was independent at a scooter level.  He reports that he used to smoke cigars, has quit smoking.  He drinks a glass of wine per night.  Does not use illicit drugs.  Wife works.  However, there is a son at home who can assist as needed.  ALLERGIES:  No known drug allergies.  SOCIAL HISTORY:  The patient was independent for transfers.  He was able to stand and take 1-2 shuffling steps, independent for ADLs.  PHYSICAL EXAMINATION:  GENERAL:  The patient is a well-nourished, well- developed male, in no acute distress. HEENT:  Atraumatic and normocephalic. NECK:  Supple without masses. CARDIOVASCULAR:  Heart with normal rate and normal rhythm. PULMONARY:  Respiratory effort normal, breath sounds normal. ABDOMEN:  Soft and nontender with positive bowel sounds. MUSCULOSKELETAL:  The patient with min edema bilateral lower extremity. Noted to have fluctuating blisters bilateral heel. NEUROLOGIC:  The patient is alert and oriented x3.  Sensory deficits  bilateral lower extremity.  The patient with abnormal tone and bilateral lower extremity with hyperreflexia, sustained clonus bilaterally with light touch.  Bilateral foot drop. SKIN:  Warm and dry.  Right buttock with denuded area.  Left heel with quarter-sized so fluctuant blister and right heel with large denuded area due to ruptured blister with redundant skin residually.  HOSPITAL COURSE:  Mr. Lance [name] was admitted to Rehab on April 06, 2011, for inpatient therapies to consist of PT/OT at least 3 hours 5 days a  week.  Past admission, physiatrist, rehab, RN, and therapy team have worked together to provide customized collaborative interdisciplinary care.  Rehab RN has assisted with bowel and bladder management as well as skin care and medication administration. Additional Prevalon boot was ordered to be used in boot.  Initially, Allevyn dressings were ordered to bilateral heels and protein supplements were added to help augment wound healing.  He was maintained on Septra DS for 10 total days to complete his antibiotic course treatment.  Iron supplements were added for his acute blood loss anemia and his H and H has been monitored along.  This has slowly been improving.  CBC last of April 21, 2011, reveals hemoglobin 8.6, hematocrit 26.6, white count 5.5, platelets 211.  Check of lytes was done to monitor his renal insufficiency.  His BUN and creatinine have improved from 42 and 2.14 respectively to 40 and 1.44.  The patient's Foley was discontinued past admission.  The patient was able to take over cathing on q.i.d. basis during this stay.  He was started on bowel program due to constipation issues.  Initially, he required MiraLAX and Dulcolax suppository every other day to help with bowel program. Currently, the patient is refusing MiraLAX.  He is advised on using laxatives q.48 hours if no bowel movement.  WOC was consulted for input on the patient's sacral decubitus.  They recommended hydrotherapy as well as chemical debridement with Santyl.  Hydrotherapy has been ongoing during this stay.  Most recent therapy note reveals the patient's sacral decubitus with partial thickness and shallow open ulcer with red pink wound bed without slough.  He is showing 50% only partial granulation. He has had steady progress in wound healing.  No further hydrotherapy needed past discharge.  The patient to continue with dressing changes on daily changes.  Home health RN has been arranged for wound  care monitoring and dressing changes past discharge.  During the patient's stay in Rehab, weekly team conferences were held to monitor the patient's progress, set goals, as well as discuss barriers to discharge.  He has diabetes has also been monitored with a.c. and at bedtime checks and blood sugars are noted to be reasonably controlled. At admission, the patient was noted to require min assist for upper body ADL, total assist for lower body ADLs due to muscle weakness and muscle paralysis.  He required max to total assist for mobility.  The patient has been educated regarding pressure relief measures as well as energy conservation techniques.  Currently, the patient has progressed to being at supervision level for basic transfers with assistance to stabilize his motorized wheelchair.  He is min assist for car transfers with sliding board and use of leg loop.  Family education was done with the patient's wife and son about assistance as needed.  Currently, the patient is setup assist for lower body bathing in supine position.  He is at supervision for supine to sitting at the edge of bed for lower body  dressing.  He is able to use compensatory strategies to facilitate lower body bathing and dressing.  Further followup home health PT to continue by [facility] past discharge.  On April 24, 2011, the patient is discharged to home.  DISCHARGE MEDICATIONS: 1. Baclofen 10 mg t.i.d. 2. MiraLAX 17 g in 8 ounces per day. 3. Flomax 0.4 mg p.o. nightly. 4. Aspirin 81 mg p.o. per day. 5. Dulcolax suppository every other day p.r.n. 6. Omega-3 fatty acid per day. 7. Amaryl 4 mg p.o. per day. 8. Ditropan 5 mg p.o. per day. 9. Zofran 4 mg q.6 hours p.r.n. nausea. 10.Actos 30 mg p.o. per day. 11.Vitamin D 2000 units p.o. per day.  DIET:  Carb modified, medium.  SPECIAL INSTRUCTIONS:  Routine pressure relief measures and boots q.20- 30 minutes when in bed.  Santyl with damp to dry  dressing on sacral area daily.  Check CBGs on a t.i.d. basis and record.  [facility] to provide RN for wound monitoring and PT for further progressive therapies.  FOLLOWUP:  The patient to follow up with Dr. Lavone Orn for posthospital check in 2 weeks.  Follow up with Dr. Jasmine December for postop check in 1 week.  Follow up with Dr. Naaman Plummer on May 19, 2010, at 12:30 for 1 p.m.     Reesa Chew, P.A.   ______________________________ Meredith Staggers, M.D.    PL/MEDQ  D:  04/24/2011  T:  04/25/2011  Job:  [id number]  cc:   Delanna Ahmadi, M.D. Rolan Bucco, MD

## 2011-04-27 LAB — GLUCOSE, CAPILLARY: Glucose-Capillary: 89 mg/dL (ref 70–99)

## 2011-04-29 DIAGNOSIS — L89609 Pressure ulcer of unspecified heel, unspecified stage: Secondary | ICD-10-CM | POA: Diagnosis not present

## 2011-04-29 DIAGNOSIS — L89309 Pressure ulcer of unspecified buttock, unspecified stage: Secondary | ICD-10-CM | POA: Diagnosis not present

## 2011-04-29 DIAGNOSIS — I10 Essential (primary) hypertension: Secondary | ICD-10-CM | POA: Diagnosis not present

## 2011-04-29 DIAGNOSIS — E119 Type 2 diabetes mellitus without complications: Secondary | ICD-10-CM | POA: Diagnosis not present

## 2011-04-29 DIAGNOSIS — L8992 Pressure ulcer of unspecified site, stage 2: Secondary | ICD-10-CM | POA: Diagnosis not present

## 2011-04-29 DIAGNOSIS — G35 Multiple sclerosis: Secondary | ICD-10-CM | POA: Diagnosis not present

## 2011-04-29 NOTE — Telephone Encounter (Signed)
This encounter was created in error - please disregard.

## 2011-05-01 DIAGNOSIS — L89609 Pressure ulcer of unspecified heel, unspecified stage: Secondary | ICD-10-CM | POA: Diagnosis not present

## 2011-05-01 DIAGNOSIS — L89309 Pressure ulcer of unspecified buttock, unspecified stage: Secondary | ICD-10-CM | POA: Diagnosis not present

## 2011-05-01 DIAGNOSIS — E119 Type 2 diabetes mellitus without complications: Secondary | ICD-10-CM | POA: Diagnosis not present

## 2011-05-01 DIAGNOSIS — G35 Multiple sclerosis: Secondary | ICD-10-CM | POA: Diagnosis not present

## 2011-05-01 DIAGNOSIS — L8992 Pressure ulcer of unspecified site, stage 2: Secondary | ICD-10-CM | POA: Diagnosis not present

## 2011-05-01 DIAGNOSIS — I10 Essential (primary) hypertension: Secondary | ICD-10-CM | POA: Diagnosis not present

## 2011-05-04 DIAGNOSIS — G35 Multiple sclerosis: Secondary | ICD-10-CM | POA: Diagnosis not present

## 2011-05-04 DIAGNOSIS — I10 Essential (primary) hypertension: Secondary | ICD-10-CM | POA: Diagnosis not present

## 2011-05-04 DIAGNOSIS — E119 Type 2 diabetes mellitus without complications: Secondary | ICD-10-CM | POA: Diagnosis not present

## 2011-05-04 DIAGNOSIS — L89309 Pressure ulcer of unspecified buttock, unspecified stage: Secondary | ICD-10-CM | POA: Diagnosis not present

## 2011-05-04 DIAGNOSIS — L8992 Pressure ulcer of unspecified site, stage 2: Secondary | ICD-10-CM | POA: Diagnosis not present

## 2011-05-04 DIAGNOSIS — L89609 Pressure ulcer of unspecified heel, unspecified stage: Secondary | ICD-10-CM | POA: Diagnosis not present

## 2011-05-07 DIAGNOSIS — L8992 Pressure ulcer of unspecified site, stage 2: Secondary | ICD-10-CM | POA: Diagnosis not present

## 2011-05-07 DIAGNOSIS — I10 Essential (primary) hypertension: Secondary | ICD-10-CM | POA: Diagnosis not present

## 2011-05-07 DIAGNOSIS — E119 Type 2 diabetes mellitus without complications: Secondary | ICD-10-CM | POA: Diagnosis not present

## 2011-05-07 DIAGNOSIS — L89309 Pressure ulcer of unspecified buttock, unspecified stage: Secondary | ICD-10-CM | POA: Diagnosis not present

## 2011-05-07 DIAGNOSIS — L89609 Pressure ulcer of unspecified heel, unspecified stage: Secondary | ICD-10-CM | POA: Diagnosis not present

## 2011-05-07 DIAGNOSIS — G35 Multiple sclerosis: Secondary | ICD-10-CM | POA: Diagnosis not present

## 2011-05-12 DIAGNOSIS — I10 Essential (primary) hypertension: Secondary | ICD-10-CM | POA: Diagnosis not present

## 2011-05-12 DIAGNOSIS — E119 Type 2 diabetes mellitus without complications: Secondary | ICD-10-CM | POA: Diagnosis not present

## 2011-05-12 DIAGNOSIS — L89609 Pressure ulcer of unspecified heel, unspecified stage: Secondary | ICD-10-CM | POA: Diagnosis not present

## 2011-05-12 DIAGNOSIS — L8992 Pressure ulcer of unspecified site, stage 2: Secondary | ICD-10-CM | POA: Diagnosis not present

## 2011-05-12 DIAGNOSIS — G35 Multiple sclerosis: Secondary | ICD-10-CM | POA: Diagnosis not present

## 2011-05-12 DIAGNOSIS — L89309 Pressure ulcer of unspecified buttock, unspecified stage: Secondary | ICD-10-CM | POA: Diagnosis not present

## 2011-05-14 DIAGNOSIS — I10 Essential (primary) hypertension: Secondary | ICD-10-CM | POA: Diagnosis not present

## 2011-05-14 DIAGNOSIS — L89609 Pressure ulcer of unspecified heel, unspecified stage: Secondary | ICD-10-CM | POA: Diagnosis not present

## 2011-05-14 DIAGNOSIS — E119 Type 2 diabetes mellitus without complications: Secondary | ICD-10-CM | POA: Diagnosis not present

## 2011-05-14 DIAGNOSIS — L8992 Pressure ulcer of unspecified site, stage 2: Secondary | ICD-10-CM | POA: Diagnosis not present

## 2011-05-14 DIAGNOSIS — L89309 Pressure ulcer of unspecified buttock, unspecified stage: Secondary | ICD-10-CM | POA: Diagnosis not present

## 2011-05-14 DIAGNOSIS — G35 Multiple sclerosis: Secondary | ICD-10-CM | POA: Diagnosis not present

## 2011-05-15 ENCOUNTER — Other Ambulatory Visit (HOSPITAL_COMMUNITY): Payer: Self-pay | Admitting: Urology

## 2011-05-15 ENCOUNTER — Ambulatory Visit (HOSPITAL_COMMUNITY)
Admission: RE | Admit: 2011-05-15 | Discharge: 2011-05-15 | Disposition: A | Discharge status: Home / Self Care | Payer: Medicare Other | Source: Ambulatory Visit | Attending: Urology | Admitting: Urology

## 2011-05-15 DIAGNOSIS — N200 Calculus of kidney: Secondary | ICD-10-CM | POA: Insufficient documentation

## 2011-05-18 DIAGNOSIS — L89609 Pressure ulcer of unspecified heel, unspecified stage: Secondary | ICD-10-CM | POA: Diagnosis not present

## 2011-05-18 DIAGNOSIS — L89309 Pressure ulcer of unspecified buttock, unspecified stage: Secondary | ICD-10-CM | POA: Diagnosis not present

## 2011-05-18 DIAGNOSIS — I10 Essential (primary) hypertension: Secondary | ICD-10-CM | POA: Diagnosis not present

## 2011-05-18 DIAGNOSIS — L8992 Pressure ulcer of unspecified site, stage 2: Secondary | ICD-10-CM | POA: Diagnosis not present

## 2011-05-18 DIAGNOSIS — G35 Multiple sclerosis: Secondary | ICD-10-CM | POA: Diagnosis not present

## 2011-05-18 DIAGNOSIS — E119 Type 2 diabetes mellitus without complications: Secondary | ICD-10-CM | POA: Diagnosis not present

## 2011-05-20 ENCOUNTER — Inpatient Hospital Stay: Payer: Medicare Other | Admitting: Physical Medicine & Rehabilitation

## 2011-05-20 DIAGNOSIS — I10 Essential (primary) hypertension: Secondary | ICD-10-CM | POA: Diagnosis not present

## 2011-05-20 DIAGNOSIS — L89609 Pressure ulcer of unspecified heel, unspecified stage: Secondary | ICD-10-CM | POA: Diagnosis not present

## 2011-05-20 DIAGNOSIS — E119 Type 2 diabetes mellitus without complications: Secondary | ICD-10-CM | POA: Diagnosis not present

## 2011-05-20 DIAGNOSIS — L8992 Pressure ulcer of unspecified site, stage 2: Secondary | ICD-10-CM | POA: Diagnosis not present

## 2011-05-20 DIAGNOSIS — G35 Multiple sclerosis: Secondary | ICD-10-CM | POA: Diagnosis not present

## 2011-05-20 DIAGNOSIS — L89309 Pressure ulcer of unspecified buttock, unspecified stage: Secondary | ICD-10-CM | POA: Diagnosis not present

## 2011-05-21 DIAGNOSIS — G35 Multiple sclerosis: Secondary | ICD-10-CM | POA: Diagnosis not present

## 2011-05-21 DIAGNOSIS — E119 Type 2 diabetes mellitus without complications: Secondary | ICD-10-CM | POA: Diagnosis not present

## 2011-05-21 DIAGNOSIS — L89309 Pressure ulcer of unspecified buttock, unspecified stage: Secondary | ICD-10-CM | POA: Diagnosis not present

## 2011-05-21 DIAGNOSIS — I10 Essential (primary) hypertension: Secondary | ICD-10-CM | POA: Diagnosis not present

## 2011-05-21 DIAGNOSIS — L89609 Pressure ulcer of unspecified heel, unspecified stage: Secondary | ICD-10-CM | POA: Diagnosis not present

## 2011-05-21 DIAGNOSIS — L8992 Pressure ulcer of unspecified site, stage 2: Secondary | ICD-10-CM | POA: Diagnosis not present

## 2011-05-22 DIAGNOSIS — L89309 Pressure ulcer of unspecified buttock, unspecified stage: Secondary | ICD-10-CM | POA: Diagnosis not present

## 2011-05-22 DIAGNOSIS — I10 Essential (primary) hypertension: Secondary | ICD-10-CM | POA: Diagnosis not present

## 2011-05-22 DIAGNOSIS — G35 Multiple sclerosis: Secondary | ICD-10-CM | POA: Diagnosis not present

## 2011-05-22 DIAGNOSIS — L8992 Pressure ulcer of unspecified site, stage 2: Secondary | ICD-10-CM | POA: Diagnosis not present

## 2011-05-22 DIAGNOSIS — L89609 Pressure ulcer of unspecified heel, unspecified stage: Secondary | ICD-10-CM | POA: Diagnosis not present

## 2011-05-22 DIAGNOSIS — E119 Type 2 diabetes mellitus without complications: Secondary | ICD-10-CM | POA: Diagnosis not present

## 2011-05-26 DIAGNOSIS — L89609 Pressure ulcer of unspecified heel, unspecified stage: Secondary | ICD-10-CM | POA: Diagnosis not present

## 2011-05-26 DIAGNOSIS — E119 Type 2 diabetes mellitus without complications: Secondary | ICD-10-CM | POA: Diagnosis not present

## 2011-05-26 DIAGNOSIS — L8992 Pressure ulcer of unspecified site, stage 2: Secondary | ICD-10-CM | POA: Diagnosis not present

## 2011-05-26 DIAGNOSIS — L89309 Pressure ulcer of unspecified buttock, unspecified stage: Secondary | ICD-10-CM | POA: Diagnosis not present

## 2011-05-26 DIAGNOSIS — I10 Essential (primary) hypertension: Secondary | ICD-10-CM | POA: Diagnosis not present

## 2011-05-26 DIAGNOSIS — G35 Multiple sclerosis: Secondary | ICD-10-CM | POA: Diagnosis not present

## 2011-05-28 DIAGNOSIS — G35 Multiple sclerosis: Secondary | ICD-10-CM | POA: Diagnosis not present

## 2011-05-28 DIAGNOSIS — I10 Essential (primary) hypertension: Secondary | ICD-10-CM | POA: Diagnosis not present

## 2011-05-28 DIAGNOSIS — L8992 Pressure ulcer of unspecified site, stage 2: Secondary | ICD-10-CM | POA: Diagnosis not present

## 2011-05-28 DIAGNOSIS — L89609 Pressure ulcer of unspecified heel, unspecified stage: Secondary | ICD-10-CM | POA: Diagnosis not present

## 2011-05-28 DIAGNOSIS — L89309 Pressure ulcer of unspecified buttock, unspecified stage: Secondary | ICD-10-CM | POA: Diagnosis not present

## 2011-05-28 DIAGNOSIS — E119 Type 2 diabetes mellitus without complications: Secondary | ICD-10-CM | POA: Diagnosis not present

## 2011-05-29 DIAGNOSIS — E119 Type 2 diabetes mellitus without complications: Secondary | ICD-10-CM | POA: Diagnosis not present

## 2011-05-29 DIAGNOSIS — L8992 Pressure ulcer of unspecified site, stage 2: Secondary | ICD-10-CM | POA: Diagnosis not present

## 2011-05-29 DIAGNOSIS — I10 Essential (primary) hypertension: Secondary | ICD-10-CM | POA: Diagnosis not present

## 2011-05-29 DIAGNOSIS — L89309 Pressure ulcer of unspecified buttock, unspecified stage: Secondary | ICD-10-CM | POA: Diagnosis not present

## 2011-05-29 DIAGNOSIS — L89609 Pressure ulcer of unspecified heel, unspecified stage: Secondary | ICD-10-CM | POA: Diagnosis not present

## 2011-05-29 DIAGNOSIS — G35 Multiple sclerosis: Secondary | ICD-10-CM | POA: Diagnosis not present

## 2011-06-01 DIAGNOSIS — G35 Multiple sclerosis: Secondary | ICD-10-CM | POA: Diagnosis not present

## 2011-06-01 DIAGNOSIS — L89609 Pressure ulcer of unspecified heel, unspecified stage: Secondary | ICD-10-CM | POA: Diagnosis not present

## 2011-06-01 DIAGNOSIS — E119 Type 2 diabetes mellitus without complications: Secondary | ICD-10-CM | POA: Diagnosis not present

## 2011-06-01 DIAGNOSIS — L89309 Pressure ulcer of unspecified buttock, unspecified stage: Secondary | ICD-10-CM | POA: Diagnosis not present

## 2011-06-01 DIAGNOSIS — I10 Essential (primary) hypertension: Secondary | ICD-10-CM | POA: Diagnosis not present

## 2011-06-01 DIAGNOSIS — L8992 Pressure ulcer of unspecified site, stage 2: Secondary | ICD-10-CM | POA: Diagnosis not present

## 2011-06-02 DIAGNOSIS — G35 Multiple sclerosis: Secondary | ICD-10-CM | POA: Diagnosis not present

## 2011-06-02 DIAGNOSIS — E119 Type 2 diabetes mellitus without complications: Secondary | ICD-10-CM | POA: Diagnosis not present

## 2011-06-02 DIAGNOSIS — L8992 Pressure ulcer of unspecified site, stage 2: Secondary | ICD-10-CM | POA: Diagnosis not present

## 2011-06-02 DIAGNOSIS — L89609 Pressure ulcer of unspecified heel, unspecified stage: Secondary | ICD-10-CM | POA: Diagnosis not present

## 2011-06-02 DIAGNOSIS — L89309 Pressure ulcer of unspecified buttock, unspecified stage: Secondary | ICD-10-CM | POA: Diagnosis not present

## 2011-06-02 DIAGNOSIS — I10 Essential (primary) hypertension: Secondary | ICD-10-CM | POA: Diagnosis not present

## 2011-06-04 DIAGNOSIS — I10 Essential (primary) hypertension: Secondary | ICD-10-CM | POA: Diagnosis not present

## 2011-06-04 DIAGNOSIS — L89309 Pressure ulcer of unspecified buttock, unspecified stage: Secondary | ICD-10-CM | POA: Diagnosis not present

## 2011-06-04 DIAGNOSIS — E119 Type 2 diabetes mellitus without complications: Secondary | ICD-10-CM | POA: Diagnosis not present

## 2011-06-04 DIAGNOSIS — G35 Multiple sclerosis: Secondary | ICD-10-CM | POA: Diagnosis not present

## 2011-06-04 DIAGNOSIS — L89609 Pressure ulcer of unspecified heel, unspecified stage: Secondary | ICD-10-CM | POA: Diagnosis not present

## 2011-06-04 DIAGNOSIS — L8992 Pressure ulcer of unspecified site, stage 2: Secondary | ICD-10-CM | POA: Diagnosis not present

## 2011-06-05 DIAGNOSIS — L8992 Pressure ulcer of unspecified site, stage 2: Secondary | ICD-10-CM | POA: Diagnosis not present

## 2011-06-05 DIAGNOSIS — I10 Essential (primary) hypertension: Secondary | ICD-10-CM | POA: Diagnosis not present

## 2011-06-05 DIAGNOSIS — G35 Multiple sclerosis: Secondary | ICD-10-CM | POA: Diagnosis not present

## 2011-06-05 DIAGNOSIS — L89309 Pressure ulcer of unspecified buttock, unspecified stage: Secondary | ICD-10-CM | POA: Diagnosis not present

## 2011-06-05 DIAGNOSIS — E119 Type 2 diabetes mellitus without complications: Secondary | ICD-10-CM | POA: Diagnosis not present

## 2011-06-05 DIAGNOSIS — L89609 Pressure ulcer of unspecified heel, unspecified stage: Secondary | ICD-10-CM | POA: Diagnosis not present

## 2011-06-08 ENCOUNTER — Inpatient Hospital Stay: Payer: Medicare Other | Admitting: Physical Medicine & Rehabilitation

## 2011-06-09 DIAGNOSIS — E119 Type 2 diabetes mellitus without complications: Secondary | ICD-10-CM | POA: Diagnosis not present

## 2011-06-09 DIAGNOSIS — L89309 Pressure ulcer of unspecified buttock, unspecified stage: Secondary | ICD-10-CM | POA: Diagnosis not present

## 2011-06-09 DIAGNOSIS — I10 Essential (primary) hypertension: Secondary | ICD-10-CM | POA: Diagnosis not present

## 2011-06-09 DIAGNOSIS — G35 Multiple sclerosis: Secondary | ICD-10-CM | POA: Diagnosis not present

## 2011-06-09 DIAGNOSIS — L8992 Pressure ulcer of unspecified site, stage 2: Secondary | ICD-10-CM | POA: Diagnosis not present

## 2011-06-09 DIAGNOSIS — L89609 Pressure ulcer of unspecified heel, unspecified stage: Secondary | ICD-10-CM | POA: Diagnosis not present

## 2011-06-12 DIAGNOSIS — E119 Type 2 diabetes mellitus without complications: Secondary | ICD-10-CM | POA: Diagnosis not present

## 2011-06-12 DIAGNOSIS — I10 Essential (primary) hypertension: Secondary | ICD-10-CM | POA: Diagnosis not present

## 2011-06-12 DIAGNOSIS — L89609 Pressure ulcer of unspecified heel, unspecified stage: Secondary | ICD-10-CM | POA: Diagnosis not present

## 2011-06-12 DIAGNOSIS — L89309 Pressure ulcer of unspecified buttock, unspecified stage: Secondary | ICD-10-CM | POA: Diagnosis not present

## 2011-06-12 DIAGNOSIS — G35 Multiple sclerosis: Secondary | ICD-10-CM | POA: Diagnosis not present

## 2011-06-12 DIAGNOSIS — L8992 Pressure ulcer of unspecified site, stage 2: Secondary | ICD-10-CM | POA: Diagnosis not present

## 2011-06-16 DIAGNOSIS — I10 Essential (primary) hypertension: Secondary | ICD-10-CM | POA: Diagnosis not present

## 2011-06-16 DIAGNOSIS — E119 Type 2 diabetes mellitus without complications: Secondary | ICD-10-CM | POA: Diagnosis not present

## 2011-06-16 DIAGNOSIS — G35 Multiple sclerosis: Secondary | ICD-10-CM | POA: Diagnosis not present

## 2011-06-16 DIAGNOSIS — L89309 Pressure ulcer of unspecified buttock, unspecified stage: Secondary | ICD-10-CM | POA: Diagnosis not present

## 2011-06-16 DIAGNOSIS — L89609 Pressure ulcer of unspecified heel, unspecified stage: Secondary | ICD-10-CM | POA: Diagnosis not present

## 2011-06-16 DIAGNOSIS — L8992 Pressure ulcer of unspecified site, stage 2: Secondary | ICD-10-CM | POA: Diagnosis not present

## 2011-06-19 DIAGNOSIS — L89609 Pressure ulcer of unspecified heel, unspecified stage: Secondary | ICD-10-CM | POA: Diagnosis not present

## 2011-06-19 DIAGNOSIS — I10 Essential (primary) hypertension: Secondary | ICD-10-CM | POA: Diagnosis not present

## 2011-06-19 DIAGNOSIS — L8992 Pressure ulcer of unspecified site, stage 2: Secondary | ICD-10-CM | POA: Diagnosis not present

## 2011-06-19 DIAGNOSIS — E1129 Type 2 diabetes mellitus with other diabetic kidney complication: Secondary | ICD-10-CM | POA: Diagnosis not present

## 2011-06-19 DIAGNOSIS — G35 Multiple sclerosis: Secondary | ICD-10-CM | POA: Diagnosis not present

## 2011-06-19 DIAGNOSIS — L89309 Pressure ulcer of unspecified buttock, unspecified stage: Secondary | ICD-10-CM | POA: Diagnosis not present

## 2011-06-19 DIAGNOSIS — E119 Type 2 diabetes mellitus without complications: Secondary | ICD-10-CM | POA: Diagnosis not present

## 2011-06-23 DIAGNOSIS — L89309 Pressure ulcer of unspecified buttock, unspecified stage: Secondary | ICD-10-CM | POA: Diagnosis not present

## 2011-06-23 DIAGNOSIS — G35 Multiple sclerosis: Secondary | ICD-10-CM | POA: Diagnosis not present

## 2011-06-23 DIAGNOSIS — E119 Type 2 diabetes mellitus without complications: Secondary | ICD-10-CM | POA: Diagnosis not present

## 2011-06-23 DIAGNOSIS — I10 Essential (primary) hypertension: Secondary | ICD-10-CM | POA: Diagnosis not present

## 2011-06-23 DIAGNOSIS — L8992 Pressure ulcer of unspecified site, stage 2: Secondary | ICD-10-CM | POA: Diagnosis not present

## 2011-06-23 DIAGNOSIS — L89609 Pressure ulcer of unspecified heel, unspecified stage: Secondary | ICD-10-CM | POA: Diagnosis not present

## 2011-06-24 DIAGNOSIS — N319 Neuromuscular dysfunction of bladder, unspecified: Secondary | ICD-10-CM | POA: Diagnosis not present

## 2011-06-24 DIAGNOSIS — I10 Essential (primary) hypertension: Secondary | ICD-10-CM | POA: Diagnosis not present

## 2011-06-24 DIAGNOSIS — E119 Type 2 diabetes mellitus without complications: Secondary | ICD-10-CM | POA: Diagnosis not present

## 2011-06-24 DIAGNOSIS — L8993 Pressure ulcer of unspecified site, stage 3: Secondary | ICD-10-CM | POA: Diagnosis not present

## 2011-06-24 DIAGNOSIS — L89309 Pressure ulcer of unspecified buttock, unspecified stage: Secondary | ICD-10-CM | POA: Diagnosis not present

## 2011-06-24 DIAGNOSIS — G35 Multiple sclerosis: Secondary | ICD-10-CM | POA: Diagnosis not present

## 2011-06-26 DIAGNOSIS — Z0271 Encounter for disability determination: Secondary | ICD-10-CM | POA: Diagnosis not present

## 2011-06-30 DIAGNOSIS — I10 Essential (primary) hypertension: Secondary | ICD-10-CM | POA: Diagnosis not present

## 2011-06-30 DIAGNOSIS — L8993 Pressure ulcer of unspecified site, stage 3: Secondary | ICD-10-CM | POA: Diagnosis not present

## 2011-06-30 DIAGNOSIS — L89309 Pressure ulcer of unspecified buttock, unspecified stage: Secondary | ICD-10-CM | POA: Diagnosis not present

## 2011-06-30 DIAGNOSIS — G35 Multiple sclerosis: Secondary | ICD-10-CM | POA: Diagnosis not present

## 2011-06-30 DIAGNOSIS — N319 Neuromuscular dysfunction of bladder, unspecified: Secondary | ICD-10-CM | POA: Diagnosis not present

## 2011-06-30 DIAGNOSIS — E119 Type 2 diabetes mellitus without complications: Secondary | ICD-10-CM | POA: Diagnosis not present

## 2011-07-03 DIAGNOSIS — N319 Neuromuscular dysfunction of bladder, unspecified: Secondary | ICD-10-CM | POA: Diagnosis not present

## 2011-07-03 DIAGNOSIS — G35 Multiple sclerosis: Secondary | ICD-10-CM | POA: Diagnosis not present

## 2011-07-03 DIAGNOSIS — I10 Essential (primary) hypertension: Secondary | ICD-10-CM | POA: Diagnosis not present

## 2011-07-03 DIAGNOSIS — E119 Type 2 diabetes mellitus without complications: Secondary | ICD-10-CM | POA: Diagnosis not present

## 2011-07-03 DIAGNOSIS — L89309 Pressure ulcer of unspecified buttock, unspecified stage: Secondary | ICD-10-CM | POA: Diagnosis not present

## 2011-07-03 DIAGNOSIS — L8993 Pressure ulcer of unspecified site, stage 3: Secondary | ICD-10-CM | POA: Diagnosis not present

## 2011-07-07 DIAGNOSIS — N319 Neuromuscular dysfunction of bladder, unspecified: Secondary | ICD-10-CM | POA: Diagnosis not present

## 2011-07-07 DIAGNOSIS — I10 Essential (primary) hypertension: Secondary | ICD-10-CM | POA: Diagnosis not present

## 2011-07-07 DIAGNOSIS — E119 Type 2 diabetes mellitus without complications: Secondary | ICD-10-CM | POA: Diagnosis not present

## 2011-07-07 DIAGNOSIS — L89309 Pressure ulcer of unspecified buttock, unspecified stage: Secondary | ICD-10-CM | POA: Diagnosis not present

## 2011-07-07 DIAGNOSIS — L8993 Pressure ulcer of unspecified site, stage 3: Secondary | ICD-10-CM | POA: Diagnosis not present

## 2011-07-07 DIAGNOSIS — G35 Multiple sclerosis: Secondary | ICD-10-CM | POA: Diagnosis not present

## 2011-07-09 DIAGNOSIS — L8993 Pressure ulcer of unspecified site, stage 3: Secondary | ICD-10-CM | POA: Diagnosis not present

## 2011-07-09 DIAGNOSIS — N319 Neuromuscular dysfunction of bladder, unspecified: Secondary | ICD-10-CM | POA: Diagnosis not present

## 2011-07-09 DIAGNOSIS — L89309 Pressure ulcer of unspecified buttock, unspecified stage: Secondary | ICD-10-CM | POA: Diagnosis not present

## 2011-07-09 DIAGNOSIS — I10 Essential (primary) hypertension: Secondary | ICD-10-CM | POA: Diagnosis not present

## 2011-07-09 DIAGNOSIS — G35 Multiple sclerosis: Secondary | ICD-10-CM | POA: Diagnosis not present

## 2011-07-09 DIAGNOSIS — E119 Type 2 diabetes mellitus without complications: Secondary | ICD-10-CM | POA: Diagnosis not present

## 2011-07-10 ENCOUNTER — Encounter: Payer: Medicare Other | Attending: Physical Medicine & Rehabilitation | Admitting: Physical Medicine & Rehabilitation

## 2011-07-10 ENCOUNTER — Encounter: Payer: Self-pay | Admitting: Physical Medicine & Rehabilitation

## 2011-07-10 DIAGNOSIS — N189 Chronic kidney disease, unspecified: Secondary | ICD-10-CM | POA: Diagnosis not present

## 2011-07-10 DIAGNOSIS — L8990 Pressure ulcer of unspecified site, unspecified stage: Secondary | ICD-10-CM

## 2011-07-10 DIAGNOSIS — L89109 Pressure ulcer of unspecified part of back, unspecified stage: Secondary | ICD-10-CM

## 2011-07-10 DIAGNOSIS — G35 Multiple sclerosis: Secondary | ICD-10-CM | POA: Diagnosis not present

## 2011-07-10 DIAGNOSIS — L89159 Pressure ulcer of sacral region, unspecified stage: Secondary | ICD-10-CM

## 2011-07-10 DIAGNOSIS — G8250 Quadriplegia, unspecified: Secondary | ICD-10-CM | POA: Insufficient documentation

## 2011-07-10 DIAGNOSIS — R5381 Other malaise: Secondary | ICD-10-CM | POA: Diagnosis not present

## 2011-07-10 DIAGNOSIS — N390 Urinary tract infection, site not specified: Secondary | ICD-10-CM

## 2011-07-10 NOTE — Patient Instructions (Signed)
Continue activity to tolerance

## 2011-07-10 NOTE — Progress Notes (Addendum)
Subjective:    Patient ID: Lance [name], male    [name]: 07/27/1940, 71 y.o.   MRN: [id number]  HPI  Mr. Binion is back regarding regarding his MS and associated deconditioning. He is in need of a powered wheel chair as his current scooter doesn't provide enough truncal support. He has also had problems with skin care due to pressure on his heels and sacrum.  He cannot use a cane or a walker because his legs are too weak.  He cannot propel a manual wheelchair because of weakness in his upper extremities.  With his MS exacerbations and pseudoexacerbations he's needed a powered chair to provide him with adequate support which is scooter won't provide.  He does feel that he is getting closer to his baseline.  His pressure sores have finally healed.  He is working on keeping his bowel movements regular.  He caths himself 3x per day. He's had no further UTI's  His medical history is also significant for diabetes, anemia, and acute renal failure  Pain Inventory Average Pain 0 Pain Right Now 0 My pain is no pain  In the last 24 hours, has pain interfered with the following? General activity 0 Relation with others 0 Enjoyment of life 0 What TIME of day is your pain at its worst? n/a Sleep (in general) Good  Pain is worse with: n/a Pain improves with: n/a Relief from Meds: n/a  Mobility how many minutes can you walk? does not walk (MS) ability to climb steps?  no do you drive?  no use a wheelchair transfers alone Do you have any goals in this area?  yes  Function not employed: date last employed 02/1992 retired  Neuro/Psych bladder control problems numbness trouble walking  Prior Studies Any changes since last visit?  no  Physicians involved in your care Primary care Dr Jola Baptist urologist with [facility] (cant remember name       Review of Systems  Constitutional: Negative.   HENT: Negative.   Eyes: Negative.   Respiratory: Negative.     Cardiovascular: Negative.   Gastrointestinal: Negative.   Genitourinary: Negative.   Musculoskeletal:       Has MS  Skin: Negative.   Neurological: Positive for numbness.  Hematological: Negative.        Objective:   Physical Exam  Constitutional: He is oriented to person, place, and time. He appears well-developed.  HENT:  Head: Normocephalic.  Eyes: EOM are normal. Pupils are equal, round, and reactive to light.  Neck: Normal range of motion.  Cardiovascular: Normal rate.   Pulmonary/Chest: Effort normal.  Abdominal: Soft.  Musculoskeletal:       Patient is fair preservation of his range of motion of the shoulders hips knees ankles and feet.  Neurological: He is alert and oriented to person, place, and time.       Patient is alert and oriented x3. Sitting posture is fair. Cranial nerve exam is grossly intact today. Upper extremity strength is grossly 3-4/5 proximal to distal. He is a bit weaker in the left arm than right. Lower extremity strength is 1-2/5 left hip 1/5 right hip. He's 1+ right knee extension 2+ left knee extension. Ankle dorsiflexion plantarflexion or trace  to 1/5. His diminished sensation in both legs and to a lesser extent the arms. Reflexes are 3+ throughout. Resting tone in hip abductors and knee flexors and ankle plantar flexors is grossly 1-2/4.  Skin:       I did not examine his  sacral region today.          Assessment & Plan:  Assessment:   1. Weakness and deconditioning due to retroperitoneal and perinephric  hematomas.  2. Acute blood loss anemia.  3. Multiple sclerosis.  4. Sacral and bilateral heel decubitus.  5. Urinary tract infection, treated.  6. Spastic quadriplegia.  7. Chronic renal insufficiency.  Plan: 1. This patient is appropriate for a powered wheelchair given his multiple sclerosis and the associated exam findings above. I will submit the paperwork to that fact today to unite seating and mobility. 2. Encourage regular  exercise to tolerance but not beyond. The patient has a fairly good idea of what this means. 3. Frequent UTIs: Followup with urology as indicated. Patient continues catheterizing himself 3 times a day. 4. [place] the patient back on an as-needed basis. He was advised to call me with any questions or concerns.

## 2011-08-06 ENCOUNTER — Encounter: Payer: Self-pay | Admitting: Physical Medicine & Rehabilitation

## 2011-08-10 ENCOUNTER — Encounter: Payer: Self-pay | Admitting: Physical Medicine & Rehabilitation

## 2011-08-13 ENCOUNTER — Telehealth: Payer: Self-pay | Admitting: Physical Medicine & Rehabilitation

## 2011-08-13 NOTE — Telephone Encounter (Signed)
Spoke to [place] about the things that need to be changed on paperwork. I will make these corrections and re-fax the paperwork.

## 2011-08-13 NOTE — Telephone Encounter (Signed)
Needs corrections on paperwork.  Please call.

## 2011-08-20 DIAGNOSIS — N319 Neuromuscular dysfunction of bladder, unspecified: Secondary | ICD-10-CM | POA: Diagnosis not present

## 2011-08-20 DIAGNOSIS — N3020 Other chronic cystitis without hematuria: Secondary | ICD-10-CM | POA: Diagnosis not present

## 2011-09-21 ENCOUNTER — Telehealth: Payer: Self-pay

## 2011-09-21 NOTE — Telephone Encounter (Signed)
Questioning status of powerchair paperwork.

## 2011-09-23 ENCOUNTER — Telehealth: Payer: Self-pay | Admitting: Physical Medicine & Rehabilitation

## 2011-09-23 NOTE — Telephone Encounter (Signed)
Pt aware that the paperwork has been faxed to [employer]

## 2011-09-23 NOTE — Telephone Encounter (Signed)
The documents that were sent back were incomplete.   He is resending new forms.  Please call so he can tell you what he needs.

## 2011-09-30 ENCOUNTER — Telehealth: Payer: Self-pay | Admitting: Physical Medicine & Rehabilitation

## 2011-09-30 NOTE — Telephone Encounter (Signed)
F/u on fax requesting additional information sent 09/23/11.

## 2011-09-30 NOTE — Telephone Encounter (Signed)
Paperwork was faxed yesterday. Shanon Brow is aware of this.

## 2011-10-12 ENCOUNTER — Telehealth: Payer: Self-pay

## 2011-10-12 NOTE — Telephone Encounter (Signed)
Lance [name] needs clarification on pt appt 3/8 in order for pts insurance to cover.  Please call

## 2011-10-13 NOTE — Telephone Encounter (Signed)
Note from 3/8 printed and faxed to [place] at [phone].

## 2011-10-22 DIAGNOSIS — E1129 Type 2 diabetes mellitus with other diabetic kidney complication: Secondary | ICD-10-CM | POA: Diagnosis not present

## 2011-10-22 DIAGNOSIS — I10 Essential (primary) hypertension: Secondary | ICD-10-CM | POA: Diagnosis not present

## 2011-10-22 DIAGNOSIS — E785 Hyperlipidemia, unspecified: Secondary | ICD-10-CM | POA: Diagnosis not present

## 2011-10-28 ENCOUNTER — Telehealth: Payer: Self-pay | Admitting: Physical Medicine & Rehabilitation

## 2011-10-28 NOTE — Telephone Encounter (Signed)
Please send statement of concurrence to power chair place.  This has been going on for 42mos.

## 2011-10-28 NOTE — Telephone Encounter (Signed)
This on [place] to be signed. Pt is aware that as soon as it is signed it will be faxed.

## 2011-11-09 ENCOUNTER — Telehealth: Payer: Self-pay | Admitting: Physical Medicine & Rehabilitation

## 2011-11-09 NOTE — Telephone Encounter (Signed)
Lance [name] w/ [employer](?) check ing on status of "statement of concurrence" with sig and date (face to face date).  This message was addressed to [place].

## 2011-11-10 ENCOUNTER — Telehealth: Payer: Self-pay | Admitting: Physical Medicine & Rehabilitation

## 2011-11-10 NOTE — Telephone Encounter (Signed)
Work in process.

## 2011-11-10 NOTE — Telephone Encounter (Signed)
Forms refilled out with current date and agreement statement.  They have been faxed if this does not work pt will need to be seen.

## 2011-11-10 NOTE — Telephone Encounter (Signed)
Someone PLEASE follow up on this statement of concurrence.  It has been 6 mos now.  PLEASE call. [phone]

## 2011-11-14 DIAGNOSIS — M25519 Pain in unspecified shoulder: Secondary | ICD-10-CM | POA: Diagnosis not present

## 2011-11-14 DIAGNOSIS — M542 Cervicalgia: Secondary | ICD-10-CM | POA: Diagnosis not present

## 2011-11-24 DIAGNOSIS — N319 Neuromuscular dysfunction of bladder, unspecified: Secondary | ICD-10-CM | POA: Diagnosis not present

## 2011-11-24 DIAGNOSIS — N3020 Other chronic cystitis without hematuria: Secondary | ICD-10-CM | POA: Diagnosis not present

## 2011-12-01 DIAGNOSIS — H2510 Age-related nuclear cataract, unspecified eye: Secondary | ICD-10-CM | POA: Diagnosis not present

## 2011-12-01 DIAGNOSIS — E119 Type 2 diabetes mellitus without complications: Secondary | ICD-10-CM | POA: Diagnosis not present

## 2011-12-01 DIAGNOSIS — H401290 Low-tension glaucoma, unspecified eye, stage unspecified: Secondary | ICD-10-CM | POA: Diagnosis not present

## 2012-03-07 DIAGNOSIS — Z23 Encounter for immunization: Secondary | ICD-10-CM | POA: Diagnosis not present

## 2012-03-07 DIAGNOSIS — E119 Type 2 diabetes mellitus without complications: Secondary | ICD-10-CM | POA: Diagnosis not present

## 2012-03-07 DIAGNOSIS — Z0000 Encounter for general adult medical examination without abnormal findings: Secondary | ICD-10-CM | POA: Diagnosis not present

## 2012-03-07 DIAGNOSIS — Z1331 Encounter for screening for depression: Secondary | ICD-10-CM | POA: Diagnosis not present

## 2012-07-26 DIAGNOSIS — E785 Hyperlipidemia, unspecified: Secondary | ICD-10-CM | POA: Diagnosis not present

## 2012-07-26 DIAGNOSIS — E119 Type 2 diabetes mellitus without complications: Secondary | ICD-10-CM | POA: Diagnosis not present

## 2012-07-26 DIAGNOSIS — I10 Essential (primary) hypertension: Secondary | ICD-10-CM | POA: Diagnosis not present

## 2012-08-02 ENCOUNTER — Other Ambulatory Visit (HOSPITAL_COMMUNITY): Payer: Self-pay | Admitting: Urology

## 2012-08-02 ENCOUNTER — Ambulatory Visit (HOSPITAL_COMMUNITY)
Admission: RE | Admit: 2012-08-02 | Discharge: 2012-08-02 | Disposition: A | Discharge status: Home / Self Care | Payer: Medicare Other | Source: Ambulatory Visit | Attending: Urology | Admitting: Urology

## 2012-08-02 DIAGNOSIS — N200 Calculus of kidney: Secondary | ICD-10-CM

## 2012-08-02 DIAGNOSIS — R935 Abnormal findings on diagnostic imaging of other abdominal regions, including retroperitoneum: Secondary | ICD-10-CM | POA: Diagnosis not present

## 2012-11-22 DIAGNOSIS — E1129 Type 2 diabetes mellitus with other diabetic kidney complication: Secondary | ICD-10-CM | POA: Diagnosis not present

## 2012-11-22 DIAGNOSIS — I10 Essential (primary) hypertension: Secondary | ICD-10-CM | POA: Diagnosis not present

## 2013-01-05 DIAGNOSIS — N200 Calculus of kidney: Secondary | ICD-10-CM | POA: Diagnosis not present

## 2013-01-05 DIAGNOSIS — N319 Neuromuscular dysfunction of bladder, unspecified: Secondary | ICD-10-CM | POA: Diagnosis not present

## 2013-02-14 DIAGNOSIS — Z23 Encounter for immunization: Secondary | ICD-10-CM | POA: Diagnosis not present

## 2013-02-15 DIAGNOSIS — Z466 Encounter for fitting and adjustment of urinary device: Secondary | ICD-10-CM | POA: Diagnosis not present

## 2013-02-15 DIAGNOSIS — Z87440 Personal history of urinary (tract) infections: Secondary | ICD-10-CM | POA: Diagnosis not present

## 2013-02-15 DIAGNOSIS — I10 Essential (primary) hypertension: Secondary | ICD-10-CM | POA: Diagnosis not present

## 2013-02-15 DIAGNOSIS — L8995 Pressure ulcer of unspecified site, unstageable: Secondary | ICD-10-CM | POA: Diagnosis not present

## 2013-02-15 DIAGNOSIS — D638 Anemia in other chronic diseases classified elsewhere: Secondary | ICD-10-CM | POA: Diagnosis not present

## 2013-02-15 DIAGNOSIS — E1129 Type 2 diabetes mellitus with other diabetic kidney complication: Secondary | ICD-10-CM | POA: Diagnosis not present

## 2013-02-15 DIAGNOSIS — L89309 Pressure ulcer of unspecified buttock, unspecified stage: Secondary | ICD-10-CM | POA: Diagnosis not present

## 2013-02-15 DIAGNOSIS — G35 Multiple sclerosis: Secondary | ICD-10-CM | POA: Diagnosis not present

## 2013-02-16 DIAGNOSIS — L8995 Pressure ulcer of unspecified site, unstageable: Secondary | ICD-10-CM | POA: Diagnosis not present

## 2013-02-16 DIAGNOSIS — L89309 Pressure ulcer of unspecified buttock, unspecified stage: Secondary | ICD-10-CM | POA: Diagnosis not present

## 2013-02-16 DIAGNOSIS — E1129 Type 2 diabetes mellitus with other diabetic kidney complication: Secondary | ICD-10-CM | POA: Diagnosis not present

## 2013-02-16 DIAGNOSIS — Z466 Encounter for fitting and adjustment of urinary device: Secondary | ICD-10-CM | POA: Diagnosis not present

## 2013-02-16 DIAGNOSIS — G35 Multiple sclerosis: Secondary | ICD-10-CM | POA: Diagnosis not present

## 2013-02-18 DIAGNOSIS — L8995 Pressure ulcer of unspecified site, unstageable: Secondary | ICD-10-CM | POA: Diagnosis not present

## 2013-02-18 DIAGNOSIS — G35 Multiple sclerosis: Secondary | ICD-10-CM | POA: Diagnosis not present

## 2013-02-18 DIAGNOSIS — Z466 Encounter for fitting and adjustment of urinary device: Secondary | ICD-10-CM | POA: Diagnosis not present

## 2013-02-18 DIAGNOSIS — L89309 Pressure ulcer of unspecified buttock, unspecified stage: Secondary | ICD-10-CM | POA: Diagnosis not present

## 2013-02-18 DIAGNOSIS — E1129 Type 2 diabetes mellitus with other diabetic kidney complication: Secondary | ICD-10-CM | POA: Diagnosis not present

## 2013-02-21 DIAGNOSIS — E1129 Type 2 diabetes mellitus with other diabetic kidney complication: Secondary | ICD-10-CM | POA: Diagnosis not present

## 2013-02-21 DIAGNOSIS — Z466 Encounter for fitting and adjustment of urinary device: Secondary | ICD-10-CM | POA: Diagnosis not present

## 2013-02-21 DIAGNOSIS — L89309 Pressure ulcer of unspecified buttock, unspecified stage: Secondary | ICD-10-CM | POA: Diagnosis not present

## 2013-02-21 DIAGNOSIS — L8995 Pressure ulcer of unspecified site, unstageable: Secondary | ICD-10-CM | POA: Diagnosis not present

## 2013-02-21 DIAGNOSIS — G35 Multiple sclerosis: Secondary | ICD-10-CM | POA: Diagnosis not present

## 2013-02-24 DIAGNOSIS — L89309 Pressure ulcer of unspecified buttock, unspecified stage: Secondary | ICD-10-CM | POA: Diagnosis not present

## 2013-02-24 DIAGNOSIS — G35 Multiple sclerosis: Secondary | ICD-10-CM | POA: Diagnosis not present

## 2013-02-24 DIAGNOSIS — Z466 Encounter for fitting and adjustment of urinary device: Secondary | ICD-10-CM | POA: Diagnosis not present

## 2013-02-24 DIAGNOSIS — E1129 Type 2 diabetes mellitus with other diabetic kidney complication: Secondary | ICD-10-CM | POA: Diagnosis not present

## 2013-02-24 DIAGNOSIS — L8995 Pressure ulcer of unspecified site, unstageable: Secondary | ICD-10-CM | POA: Diagnosis not present

## 2013-03-01 DIAGNOSIS — E1129 Type 2 diabetes mellitus with other diabetic kidney complication: Secondary | ICD-10-CM | POA: Diagnosis not present

## 2013-03-01 DIAGNOSIS — Z466 Encounter for fitting and adjustment of urinary device: Secondary | ICD-10-CM | POA: Diagnosis not present

## 2013-03-01 DIAGNOSIS — L89309 Pressure ulcer of unspecified buttock, unspecified stage: Secondary | ICD-10-CM | POA: Diagnosis not present

## 2013-03-01 DIAGNOSIS — L8995 Pressure ulcer of unspecified site, unstageable: Secondary | ICD-10-CM | POA: Diagnosis not present

## 2013-03-01 DIAGNOSIS — G35 Multiple sclerosis: Secondary | ICD-10-CM | POA: Diagnosis not present

## 2013-03-10 DIAGNOSIS — E1129 Type 2 diabetes mellitus with other diabetic kidney complication: Secondary | ICD-10-CM | POA: Diagnosis not present

## 2013-03-10 DIAGNOSIS — L89309 Pressure ulcer of unspecified buttock, unspecified stage: Secondary | ICD-10-CM | POA: Diagnosis not present

## 2013-03-10 DIAGNOSIS — Z466 Encounter for fitting and adjustment of urinary device: Secondary | ICD-10-CM | POA: Diagnosis not present

## 2013-03-10 DIAGNOSIS — L8995 Pressure ulcer of unspecified site, unstageable: Secondary | ICD-10-CM | POA: Diagnosis not present

## 2013-03-10 DIAGNOSIS — G35 Multiple sclerosis: Secondary | ICD-10-CM | POA: Diagnosis not present

## 2013-03-15 DIAGNOSIS — G35 Multiple sclerosis: Secondary | ICD-10-CM | POA: Diagnosis not present

## 2013-03-15 DIAGNOSIS — Z466 Encounter for fitting and adjustment of urinary device: Secondary | ICD-10-CM | POA: Diagnosis not present

## 2013-03-15 DIAGNOSIS — E1129 Type 2 diabetes mellitus with other diabetic kidney complication: Secondary | ICD-10-CM | POA: Diagnosis not present

## 2013-03-15 DIAGNOSIS — L8995 Pressure ulcer of unspecified site, unstageable: Secondary | ICD-10-CM | POA: Diagnosis not present

## 2013-03-15 DIAGNOSIS — L89309 Pressure ulcer of unspecified buttock, unspecified stage: Secondary | ICD-10-CM | POA: Diagnosis not present

## 2013-03-23 DIAGNOSIS — E119 Type 2 diabetes mellitus without complications: Secondary | ICD-10-CM | POA: Diagnosis not present

## 2013-03-23 DIAGNOSIS — I10 Essential (primary) hypertension: Secondary | ICD-10-CM | POA: Diagnosis not present

## 2013-07-23 DIAGNOSIS — L02619 Cutaneous abscess of unspecified foot: Secondary | ICD-10-CM | POA: Diagnosis not present

## 2013-07-23 DIAGNOSIS — L03119 Cellulitis of unspecified part of limb: Secondary | ICD-10-CM | POA: Diagnosis not present

## 2013-08-01 ENCOUNTER — Other Ambulatory Visit: Payer: Self-pay | Admitting: Internal Medicine

## 2013-08-01 DIAGNOSIS — N183 Chronic kidney disease, stage 3 unspecified: Secondary | ICD-10-CM | POA: Diagnosis not present

## 2013-08-01 DIAGNOSIS — E1129 Type 2 diabetes mellitus with other diabetic kidney complication: Secondary | ICD-10-CM | POA: Diagnosis not present

## 2013-08-01 DIAGNOSIS — L03119 Cellulitis of unspecified part of limb: Principal | ICD-10-CM

## 2013-08-01 DIAGNOSIS — E785 Hyperlipidemia, unspecified: Secondary | ICD-10-CM | POA: Diagnosis not present

## 2013-08-01 DIAGNOSIS — L02619 Cutaneous abscess of unspecified foot: Secondary | ICD-10-CM

## 2013-08-01 DIAGNOSIS — L089 Local infection of the skin and subcutaneous tissue, unspecified: Secondary | ICD-10-CM | POA: Diagnosis not present

## 2013-08-02 ENCOUNTER — Encounter (HOSPITAL_COMMUNITY): Payer: Self-pay | Admitting: Emergency Medicine

## 2013-08-02 ENCOUNTER — Other Ambulatory Visit: Payer: Medicare Other

## 2013-08-02 ENCOUNTER — Inpatient Hospital Stay (HOSPITAL_COMMUNITY)
Admission: EM | Admit: 2013-08-02 | Discharge: 2013-08-06 | DRG: 603 | Disposition: A | Discharge status: Home Health | Payer: Medicare Other | Attending: Internal Medicine | Admitting: Internal Medicine

## 2013-08-02 ENCOUNTER — Emergency Department (HOSPITAL_COMMUNITY): Payer: Medicare Other

## 2013-08-02 DIAGNOSIS — L03119 Cellulitis of unspecified part of limb: Secondary | ICD-10-CM

## 2013-08-02 DIAGNOSIS — S8990XA Unspecified injury of unspecified lower leg, initial encounter: Secondary | ICD-10-CM | POA: Diagnosis not present

## 2013-08-02 DIAGNOSIS — L089 Local infection of the skin and subcutaneous tissue, unspecified: Secondary | ICD-10-CM | POA: Diagnosis not present

## 2013-08-02 DIAGNOSIS — Z87891 Personal history of nicotine dependence: Secondary | ICD-10-CM

## 2013-08-02 DIAGNOSIS — E119 Type 2 diabetes mellitus without complications: Secondary | ICD-10-CM | POA: Diagnosis not present

## 2013-08-02 DIAGNOSIS — D649 Anemia, unspecified: Secondary | ICD-10-CM

## 2013-08-02 DIAGNOSIS — N189 Chronic kidney disease, unspecified: Secondary | ICD-10-CM | POA: Diagnosis not present

## 2013-08-02 DIAGNOSIS — I10 Essential (primary) hypertension: Secondary | ICD-10-CM | POA: Diagnosis not present

## 2013-08-02 DIAGNOSIS — N183 Chronic kidney disease, stage 3 unspecified: Secondary | ICD-10-CM | POA: Diagnosis present

## 2013-08-02 DIAGNOSIS — I129 Hypertensive chronic kidney disease with stage 1 through stage 4 chronic kidney disease, or unspecified chronic kidney disease: Secondary | ICD-10-CM | POA: Diagnosis not present

## 2013-08-02 DIAGNOSIS — D509 Iron deficiency anemia, unspecified: Secondary | ICD-10-CM | POA: Diagnosis not present

## 2013-08-02 DIAGNOSIS — IMO0002 Reserved for concepts with insufficient information to code with codable children: Secondary | ICD-10-CM | POA: Diagnosis present

## 2013-08-02 DIAGNOSIS — L0291 Cutaneous abscess, unspecified: Secondary | ICD-10-CM | POA: Diagnosis not present

## 2013-08-02 DIAGNOSIS — D72829 Elevated white blood cell count, unspecified: Secondary | ICD-10-CM | POA: Diagnosis not present

## 2013-08-02 DIAGNOSIS — S99919A Unspecified injury of unspecified ankle, initial encounter: Secondary | ICD-10-CM | POA: Diagnosis not present

## 2013-08-02 DIAGNOSIS — Z7982 Long term (current) use of aspirin: Secondary | ICD-10-CM | POA: Diagnosis not present

## 2013-08-02 DIAGNOSIS — E1169 Type 2 diabetes mellitus with other specified complication: Secondary | ICD-10-CM | POA: Diagnosis present

## 2013-08-02 DIAGNOSIS — M7989 Other specified soft tissue disorders: Secondary | ICD-10-CM | POA: Diagnosis not present

## 2013-08-02 DIAGNOSIS — R739 Hyperglycemia, unspecified: Secondary | ICD-10-CM

## 2013-08-02 DIAGNOSIS — E669 Obesity, unspecified: Secondary | ICD-10-CM | POA: Diagnosis present

## 2013-08-02 DIAGNOSIS — L02619 Cutaneous abscess of unspecified foot: Secondary | ICD-10-CM | POA: Diagnosis not present

## 2013-08-02 DIAGNOSIS — X58XXXA Exposure to other specified factors, initial encounter: Secondary | ICD-10-CM | POA: Diagnosis present

## 2013-08-02 DIAGNOSIS — K5900 Constipation, unspecified: Secondary | ICD-10-CM | POA: Diagnosis present

## 2013-08-02 DIAGNOSIS — G35 Multiple sclerosis: Secondary | ICD-10-CM | POA: Diagnosis not present

## 2013-08-02 DIAGNOSIS — Z79899 Other long term (current) drug therapy: Secondary | ICD-10-CM

## 2013-08-02 DIAGNOSIS — L02419 Cutaneous abscess of limb, unspecified: Secondary | ICD-10-CM | POA: Diagnosis not present

## 2013-08-02 DIAGNOSIS — M79609 Pain in unspecified limb: Secondary | ICD-10-CM | POA: Diagnosis not present

## 2013-08-02 HISTORY — DX: Multiple sclerosis: G35

## 2013-08-02 LAB — BASIC METABOLIC PANEL
BUN: 40 mg/dL — AB (ref 6–23)
CHLORIDE: 99 meq/L (ref 96–112)
CO2: 22 meq/L (ref 19–32)
CREATININE: 1.78 mg/dL — AB (ref 0.50–1.35)
Calcium: 9.2 mg/dL (ref 8.4–10.5)
GFR calc Af Amer: 42 mL/min — ABNORMAL LOW (ref >90)
GFR calc non Af Amer: 36 mL/min — ABNORMAL LOW (ref >90)
GLUCOSE: 281 mg/dL — AB (ref 70–99)
POTASSIUM: 4.8 meq/L (ref 3.7–5.3)
Sodium: 137 mEq/L (ref 137–147)

## 2013-08-02 LAB — CBC WITH DIFFERENTIAL/PLATELET
BASOS PCT: 0 % (ref 0–1)
Basophils Absolute: 0 10*3/uL (ref 0.0–0.1)
Eosinophils Absolute: 0.3 10*3/uL (ref 0.0–0.7)
Eosinophils Relative: 2 % (ref 0–5)
HEMATOCRIT: 29.2 % — AB (ref 39.0–52.0)
HEMOGLOBIN: 9.7 g/dL — AB (ref 13.0–17.0)
LYMPHS ABS: 1.1 10*3/uL (ref 0.7–4.0)
Lymphocytes Relative: 9 % — ABNORMAL LOW (ref 12–46)
MCH: 31.6 pg (ref 26.0–34.0)
MCHC: 33.2 g/dL (ref 30.0–36.0)
MCV: 95.1 fL (ref 78.0–100.0)
MONO ABS: 1.2 10*3/uL — AB (ref 0.1–1.0)
MONOS PCT: 10 % (ref 3–12)
NEUTROS ABS: 10.2 10*3/uL — AB (ref 1.7–7.7)
NEUTROS PCT: 79 % — AB (ref 43–77)
Platelets: 245 10*3/uL (ref 150–400)
RBC: 3.07 MIL/uL — AB (ref 4.22–5.81)
RDW: 14.2 % (ref 11.5–15.5)
WBC: 12.8 10*3/uL — ABNORMAL HIGH (ref 4.0–10.5)

## 2013-08-02 LAB — URINALYSIS, ROUTINE W REFLEX MICROSCOPIC
BILIRUBIN URINE: NEGATIVE
GLUCOSE, UA: 250 mg/dL — AB
KETONES UR: NEGATIVE mg/dL
NITRITE: NEGATIVE
PH: 5.5 (ref 5.0–8.0)
Protein, ur: NEGATIVE mg/dL
SPECIFIC GRAVITY, URINE: 1.017 (ref 1.005–1.030)
Urobilinogen, UA: 0.2 mg/dL (ref 0.0–1.0)

## 2013-08-02 LAB — URINE MICROSCOPIC-ADD ON

## 2013-08-02 LAB — GLUCOSE, CAPILLARY
GLUCOSE-CAPILLARY: 256 mg/dL — AB (ref 70–99)
GLUCOSE-CAPILLARY: 142 mg/dL — AB (ref 70–99)

## 2013-08-02 MED ORDER — SODIUM CHLORIDE 0.9 % IV BOLUS (SEPSIS)
500.0000 mL | Freq: Once | INTRAVENOUS | Status: AC
Start: 2013-08-02 — End: 2013-08-02
  Administered 2013-08-02: 500 mL via INTRAVENOUS

## 2013-08-02 MED ORDER — ACETAMINOPHEN 650 MG RE SUPP: 650.0000 mg | Freq: Four times a day (QID) | RECTAL | Status: DC | PRN

## 2013-08-02 MED ORDER — VANCOMYCIN HCL IN DEXTROSE 1-5 GM/200ML-% IV SOLN
1000.0000 mg | Freq: Once | INTRAVENOUS | Status: AC
  Administered 2013-08-02: 1000 mg via INTRAVENOUS
  Filled 2013-08-02: qty 200

## 2013-08-02 MED ORDER — MORPHINE SULFATE 2 MG/ML IJ SOLN: 2.0000 mg | INTRAMUSCULAR | Status: DC | PRN

## 2013-08-02 MED ORDER — OXYBUTYNIN CHLORIDE 5 MG PO TABS
5.0000 mg | ORAL_TABLET | Freq: Two times a day (BID) | ORAL | Status: DC
  Administered 2013-08-02 – 2013-08-06 (×9): 5 mg via ORAL
  Filled 2013-08-02 (×11): qty 1

## 2013-08-02 MED ORDER — ASPIRIN 81 MG PO TABS
81.0000 mg | ORAL_TABLET | Freq: Every day | ORAL | Status: DC
  Administered 2013-08-02: 81 mg via ORAL
  Filled 2013-08-02 (×2): qty 1

## 2013-08-02 MED ORDER — HEPARIN SODIUM (PORCINE) 5000 UNIT/ML IJ SOLN
5000.0000 [IU] | Freq: Three times a day (TID) | INTRAMUSCULAR | Status: DC
  Administered 2013-08-02 – 2013-08-06 (×11): 5000 [IU] via SUBCUTANEOUS
  Filled 2013-08-02 (×14): qty 1

## 2013-08-02 MED ORDER — ONDANSETRON HCL 4 MG/2ML IJ SOLN: 4.0000 mg | Freq: Four times a day (QID) | INTRAMUSCULAR | Status: DC | PRN

## 2013-08-02 MED ORDER — INSULIN ASPART 100 UNIT/ML ~~LOC~~ SOLN
0.0000 [IU] | Freq: Every day | SUBCUTANEOUS | Status: DC
  Administered 2013-08-03: 2 [IU] via SUBCUTANEOUS

## 2013-08-02 MED ORDER — PNEUMOCOCCAL VAC POLYVALENT 25 MCG/0.5ML IJ INJ
0.5000 mL | INJECTION | INTRAMUSCULAR | Status: DC
  Filled 2013-08-02: qty 0.5

## 2013-08-02 MED ORDER — SIMVASTATIN 20 MG PO TABS
20.0000 mg | ORAL_TABLET | Freq: Every evening | ORAL | Status: DC
  Administered 2013-08-02 – 2013-08-05 (×4): 20 mg via ORAL
  Filled 2013-08-02 (×5): qty 1

## 2013-08-02 MED ORDER — ONDANSETRON HCL 4 MG PO TABS: 4.0000 mg | ORAL_TABLET | Freq: Four times a day (QID) | ORAL | Status: DC | PRN

## 2013-08-02 MED ORDER — INSULIN ASPART 100 UNIT/ML ~~LOC~~ SOLN
0.0000 [IU] | Freq: Three times a day (TID) | SUBCUTANEOUS | Status: DC
  Administered 2013-08-02: 8 [IU] via SUBCUTANEOUS
  Administered 2013-08-03 (×2): 3 [IU] via SUBCUTANEOUS
  Administered 2013-08-04: 2 [IU] via SUBCUTANEOUS
  Administered 2013-08-04: 5 [IU] via SUBCUTANEOUS
  Administered 2013-08-05: 3 [IU] via SUBCUTANEOUS
  Administered 2013-08-05 (×2): 2 [IU] via SUBCUTANEOUS
  Administered 2013-08-06: 3 [IU] via SUBCUTANEOUS

## 2013-08-02 MED ORDER — VANCOMYCIN HCL 10 G IV SOLR
1500.0000 mg | INTRAVENOUS | Status: AC
  Administered 2013-08-03 – 2013-08-04 (×2): 1500 mg via INTRAVENOUS
  Filled 2013-08-02 (×2): qty 1500

## 2013-08-02 MED ORDER — ACETAMINOPHEN 325 MG PO TABS: 650.0000 mg | ORAL_TABLET | Freq: Four times a day (QID) | ORAL | Status: DC | PRN

## 2013-08-02 NOTE — ED Notes (Signed)
Pt from home via [facility] with c/o right foot wound infection.  Pt cut his foot around 2 weeks ago.  Wound looked like it wasn't healing, did 10 days of antibiotics.  Was seen yesterday by PCP and told he needed an MRI of his foot.  Increasing generalized weakness starting last night.  Pt has hx of MS and DM.  Pt in NAD and A&O.

## 2013-08-02 NOTE — H&P (Signed)
[facility] History and Physical  Lance [name] [id number] [name]: 1941-02-18 DOA: 08/02/2013  Referring physician: Emergency department PCP: [name], MD  Specialists:   Chief Complaint: Foot pain/swelling  HPI: Lance [name] is a 73 y.o. male  With a hx of diabetes who presents to the ED after failing outpt abx for foot infection. Pt recently "bumped" his LE on a motorized scooter resulting in RLE swelling, pain, and redness with a developing bursa under the R heel. The patient was given an oral abx (pt does not recall name of medication) with no improvement. Pt was referred to ED for further mgt. Hospitalist was consulted for admission.  Review of Systems:  Per above, the remainder of the 10pt ros reviewed and are neg  Past Medical History  Diagnosis Date  . Hypertension   . Blood transfusion   . Neuralgia neuritis, sciatic nerve     MS for 30 years  . Diabetes mellitus   . PONV (postoperative nausea and vomiting)   . MS (multiple sclerosis)   . Blood transfusion without reported diagnosis    Past Surgical History  Procedure Laterality Date  . Tonsillectomy    . Finger surgery  2004  . Wound debridement     Social History:  reports that he has quit smoking. His smoking use included Cigars. He has never used smokeless tobacco. He reports that he drinks about 3.6 ounces of alcohol per week. He reports that he does not use illicit drugs.  where does patient live--home, ALF, SNF? and with whom if at home?  Can patient participate in ADLs?  No Known Allergies  Family History  Problem Relation Age of Onset  . Stroke Father   . Diabetes Father     (be sure to complete)  Prior to Admission medications   Medication Sig Start Date End Date Taking? Authorizing Provider  aspirin 81 MG tablet Take 1 tablet (81 mg total) by mouth daily. 03/30/11  Yes Molli Hazard, MD  Cholecalciferol (VITAMIN D3) 2000 UNITS TABS Take 2,000 mg by mouth daily.      Yes Historical Provider, MD  fish oil-omega-3 fatty acids 1000 MG capsule Take 1 capsule (1 g total) by mouth daily. 03/30/11  Yes Molli Hazard, MD  glimepiride (AMARYL) 4 MG tablet Take 4 mg by mouth every morning.     Yes Historical Provider, MD  Liniments (SALONPAS PAIN RELIEF PATCH EX) Apply 1 patch topically daily as needed (pain).   Yes Historical Provider, MD  lisinopril-hydrochlorothiazide (PRINZIDE,ZESTORETIC) 20-25 MG per tablet Take 1 tablet by mouth daily.   Yes Historical Provider, MD  oxybutynin (DITROPAN) 5 MG tablet Take 5 mg by mouth 2 (two) times daily.     Yes Historical Provider, MD  pioglitazone (ACTOS) 30 MG tablet Take 30 mg by mouth daily.     Yes Historical Provider, MD  simvastatin (ZOCOR) 20 MG tablet Take 20 mg by mouth every evening.   Yes Historical Provider, MD   Physical Exam: Filed Vitals:   08/02/13 1315 08/02/13 1330 08/02/13 1339 08/02/13 1400  BP: 148/50 138/50 138/50 138/95  Pulse: 101 102 104 98  Temp:      TempSrc:      Resp:   18   SpO2: 98% 99% 100% 98%     General:  Awake, in nad  Eyes: PERRL B  ENT: membranes moist, dentition fair  Neck: trachea midline, neck supple  Cardiovascular: regular, s1, s2  Respiratory: normal resp effort, no wheezing  Abdomen: soft, nondistended  Skin: scab over dorsum of R foot with surrounding erythema and swelling, bursa under R heel  Musculoskeletal: perfused, no clubbing  Psychiatric: mood/affect normal // no auditory/visual hallucinations  Neurologic: cn2-12 grossly intact strength/sensation intact  Labs on Admission:  Basic Metabolic Panel:  Recent Labs Lab 08/02/13 1040  NA 137  K 4.8  CL 99  CO2 22  GLUCOSE 281*  BUN 40*  CREATININE 1.78*  CALCIUM 9.2   Liver Function Tests: No results found for this basename: AST, ALT, ALKPHOS, BILITOT, PROT, ALBUMIN,  in the last 168 hours No results found for this basename: LIPASE, AMYLASE,  in the last 168 hours No results found  for this basename: AMMONIA,  in the last 168 hours CBC:  Recent Labs Lab 08/02/13 1040  WBC 12.8*  NEUTROABS 10.2*  HGB 9.7*  HCT 29.2*  MCV 95.1  PLT 245   Cardiac Enzymes: No results found for this basename: CKTOTAL, CKMB, CKMBINDEX, TROPONINI,  in the last 168 hours  BNP (last 3 results) No results found for this basename: PROBNP,  in the last 8760 hours CBG: No results found for this basename: GLUCAP,  in the last 168 hours  Radiological Exams on Admission: Dg Foot Complete Right  08/02/2013   CLINICAL DATA:  Recent injury  EXAM: RIGHT FOOT COMPLETE - 3+ VIEW  COMPARISON:  None.  FINDINGS: Hallux valgus deformity is noted. No acute fracture or dislocation is seen. Calcaneal spurs are noted. Mild tarsal degenerative changes are seen. Generalized soft tissue swelling is noted.  IMPRESSION: Soft tissue swelling without acute bony abnormality.   Electronically Signed   By: Inez Catalina M.D.   On: 08/02/2013 11:31    Assessment/Plan Active Problems:   MS (multiple sclerosis) pseudoexacerbation   Foot infection   Cellulitis of foot   Diabetes mellitus type 2 in obese   1. Cellulitis 1. Mild leukocytosis 2. Vanc started in ED. Will continue 3. Will admit to med-surg, inpt status given failure of outpt abx 2. DM 1. Cont on SSI coverage and hold oral meds while inpt 2. Carb modified diet 3. MS 1. Appears stable 4. DVT prophylaxis 1. Heparin subq  Code Status: Full (must indicate code status--if unknown or must be presumed, indicate so) Family Communication: Pt and family in room (indicate person spoken with, if applicable, with phone number if by telephone) Disposition Plan: Pending (indicate anticipated LOS)  Time spent: 65min  [facility] Pager [phone]  If 7PM-7AM, please contact night-coverage [email or web address] Password [email or web address] 08/02/2013, 2:28 PM

## 2013-08-02 NOTE — Progress Notes (Signed)
ANTIBIOTIC CONSULT NOTE - INITIAL  Pharmacy Consult for vancomycin Indication: RLE cellulitis  No Known Allergies  Patient Measurements: Height: 6\' 4"  (193 cm) Weight: 205 lb (92.987 kg) IBW/kg (Calculated) : 86.8  Vital Signs: Temp: 99.6 F (37.6 C) (04/01 1543) Temp src: Oral (04/01 1543) BP: 146/66 mmHg (04/01 1543) Pulse Rate: 98 (04/01 1543) Intake/Output from previous day:   Intake/Output from this shift: Total I/O In: 240 [P.O.:240] Out: 500 [Urine:500]  Labs:  Recent Labs  08/02/13 1040  WBC 12.8*  HGB 9.7*  PLT 245  CREATININE 1.78*   Estimated Creatinine Clearance: 46.1 ml/min (by C-G formula based on Cr of 1.78). No results found for this basename: VANCOTROUGH, VANCOPEAK, VANCORANDOM, GENTTROUGH, GENTPEAK, GENTRANDOM, TOBRATROUGH, TOBRAPEAK, TOBRARND, AMIKACINPEAK, AMIKACINTROU, AMIKACIN,  in the last 72 hours   Microbiology: No results found for this or any previous visit (from the past 720 hour(s)).  Medical History: Past Medical History  Diagnosis Date  . Hypertension   . Blood transfusion   . Neuralgia neuritis, sciatic nerve     MS for 30 years  . Diabetes mellitus   . PONV (postoperative nausea and vomiting)   . MS (multiple sclerosis)   . Blood transfusion without reported diagnosis     Medications:  Prescriptions prior to admission  Medication Sig Dispense Refill  . aspirin 81 MG tablet Take 1 tablet (81 mg total) by mouth daily.  1 tablet  0  . Cholecalciferol (VITAMIN D3) 2000 UNITS TABS Take 2,000 mg by mouth daily.        . fish oil-omega-3 fatty acids 1000 MG capsule Take 1 capsule (1 g total) by mouth daily.  1 capsule  0  . glimepiride (AMARYL) 4 MG tablet Take 4 mg by mouth every morning.        . Liniments (SALONPAS PAIN RELIEF PATCH EX) Apply 1 patch topically daily as needed (pain).      Marland Kitchen lisinopril-hydrochlorothiazide (PRINZIDE,ZESTORETIC) 20-25 MG per tablet Take 1 tablet by mouth daily.      Marland Kitchen oxybutynin (DITROPAN) 5 MG  tablet Take 5 mg by mouth 2 (two) times daily.        . pioglitazone (ACTOS) 30 MG tablet Take 30 mg by mouth daily.        . simvastatin (ZOCOR) 20 MG tablet Take 20 mg by mouth every evening.       Assessment: 73 y/o male who presents to the ED after failing outpatient antibiotics for a R foot infection. He does not recall the name of the antibiotic but cephalexin filled recently. Pharmacy consulted to begin vancomycin for cellulitis. He had vancomycin 1000 mg IV at 13:06. SCr is elevated at 1.78 - unsure if acute vs chronic in patient with diabetes. He has a mild fever and WBC are elevated.   Goal of Therapy:  Vancomycin trough level 10-15 mcg/ml  Plan:  - Vancomycin 1500 mg IV q24h - Monitor renal function and clinical progress  [facility], Pharm.D., BCPS Clinical Pharmacist Pager: [phone] 08/02/2013 7:00 PM

## 2013-08-02 NOTE — ED Provider Notes (Signed)
CSN: [id number]     Arrival date & time 08/02/13  1018 History   First MD Initiated Contact with Patient 08/02/13 1022     Chief Complaint  Patient presents with  . Foot Injury  . Wound Infection     (Consider location/radiation/quality/duration/timing/severity/associated sxs/prior Treatment) HPI Comments: Pt states that he cut the top of his right foot about 2 weeks ago. Pt states that when it wasn't healing about 1 week ago he went to see his pcp and was started on antibiotics. Pt states that he noticed that it still wasn't completely better yesterday he went back to his pcp who has a mri of the foot scheduled. Pt states that over night he developed generalized weakness and chills. Pt states that his pcp told him to come to there er. Denies definite fever. Pt state that due to the ms he doesn't have good sensation in his foot. Pt noticed a couple of days ago that he developed a blister on the heal  The history is provided by the patient. No language interpreter was used.    Past Medical History  Diagnosis Date  . Hypertension   . Blood transfusion   . Neuralgia neuritis, sciatic nerve     MS for 30 years  . Diabetes mellitus   . PONV (postoperative nausea and vomiting)   . MS (multiple sclerosis)   . Blood transfusion without reported diagnosis    Past Surgical History  Procedure Laterality Date  . Tonsillectomy    . Finger surgery  2004  . Wound debridement     Family History  Problem Relation Age of Onset  . Stroke Father   . Diabetes Father    History  Substance Use Topics  . Smoking status: Former Smoker    Types: Cigars  . Smokeless tobacco: Never Used  . Alcohol Use: 3.6 oz/week    6 Glasses of wine per week    Review of Systems  Constitutional: Positive for chills.  Respiratory: Negative.   Cardiovascular: Negative.   Neurological: Positive for weakness.      Allergies  Review of patient's allergies indicates no known allergies.  Home Medications    Current Outpatient Rx  Name  Route  Sig  Dispense  Refill  . aspirin 81 MG tablet   Oral   Take 1 tablet (81 mg total) by mouth daily.   1 tablet   0   . Chlorhexidine Gluconate Cloth 2 % PADS   Topical   Apply 6 each topically daily at 6 (six) AM.   30 each   0   . Cholecalciferol (VITAMIN D3) 2000 UNITS TABS   Oral   Take 2,000 mg by mouth daily.           . feeding supplement (ENSURE CLINICAL STRENGTH) LIQD   Oral   Take 237 mLs by mouth 3 (three) times daily with meals.   1 Bottle   0   . fish oil-omega-3 fatty acids 1000 MG capsule   Oral   Take 1 capsule (1 g total) by mouth daily.   1 capsule   0   . glimepiride (AMARYL) 4 MG tablet   Oral   Take 4 mg by mouth every morning.           Marland Kitchen EXPIRED: insulin aspart (NOVOLOG) 100 UNIT/ML injection   Subcutaneous   Inject 0-15 Units into the skin 3 (three) times daily with meals.   1 vial   0   . EXPIRED:  insulin aspart (NOVOLOG) 100 UNIT/ML injection   Subcutaneous   Inject 0-5 Units into the skin at bedtime.   1 vial   0   . lisinopril-hydrochlorothiazide (PRINZIDE,ZESTORETIC) 20-25 MG per tablet   Oral   Take 1 tablet by mouth daily.         . Nutritional Supplements (JUVEN) PACK   Oral   Take 2 packets by mouth 2 (two) times daily.   30 each   0   . oxybutynin (DITROPAN) 5 MG tablet   Oral   Take 5 mg by mouth 2 (two) times daily.           . pioglitazone (ACTOS) 30 MG tablet   Oral   Take 30 mg by mouth daily.           . simvastatin (ZOCOR) 20 MG tablet   Oral   Take 20 mg by mouth every evening.         . Tamsulosin HCl (FLOMAX) 0.4 MG CAPS   Oral   Take 1 capsule (0.4 mg total) by mouth at bedtime.   30 capsule   3   . Tamsulosin HCl (FLOMAX) 0.4 MG CAPS   Oral   Take 1 capsule (0.4 mg total) by mouth daily after supper.   30 capsule   1    BP 140/49  Pulse 99  Temp(Src) 98.6 F (37 C) (Oral)  Resp 20  SpO2 99% Physical Exam  Nursing note and vitals  reviewed. Constitutional: He is oriented to person, place, and time. He appears well-developed and well-nourished.  Cardiovascular: Normal rate and regular rhythm.   Pulmonary/Chest: Effort normal and breath sounds normal.  Neurological: He is alert and oriented to person, place, and time.  Skin:  Abrasion noted to the top of the right foot with mild redness noted to the area. Pt has a large blister noted to the medial aspect to the right heel    ED Course  Procedures (including critical care time) Labs Review Labs Reviewed  CBC WITH DIFFERENTIAL - Abnormal; Notable for the following:    WBC 12.8 (*)    RBC 3.07 (*)    Hemoglobin 9.7 (*)    HCT 29.2 (*)    Neutrophils Relative % 79 (*)    Neutro Abs 10.2 (*)    Lymphocytes Relative 9 (*)    Monocytes Absolute 1.2 (*)    All other components within normal limits  BASIC METABOLIC PANEL - Abnormal; Notable for the following:    Glucose, Bld 281 (*)    BUN 40 (*)    Creatinine, Ser 1.78 (*)    GFR calc non Af Amer 36 (*)    GFR calc Af Amer 42 (*)    All other components within normal limits  URINALYSIS, ROUTINE W REFLEX MICROSCOPIC   Imaging Review Dg Foot Complete Right  08/02/2013   CLINICAL DATA:  Recent injury  EXAM: RIGHT FOOT COMPLETE - 3+ VIEW  COMPARISON:  None.  FINDINGS: Hallux valgus deformity is noted. No acute fracture or dislocation is seen. Calcaneal spurs are noted. Mild tarsal degenerative changes are seen. Generalized soft tissue swelling is noted.  IMPRESSION: Soft tissue swelling without acute bony abnormality.   Electronically Signed   By: Inez Catalina M.D.   On: 08/02/2013 11:31     EKG Interpretation None      MDM   Final diagnoses:  Foot infection  Hyperglycemia  Leukocytosis    Pt to admitted to the hospitalist for  failed outpt treatment of infection    Glendell Docker, NP 08/02/13 1346

## 2013-08-02 NOTE — ED Provider Notes (Signed)
Medical screening examination/treatment/procedure(s) were conducted as a shared visit with non-physician practitioner(s) and myself.  I personally evaluated the patient during the encounter.   EKG Interpretation None      Results for orders placed during the hospital encounter of 08/02/13  CBC WITH DIFFERENTIAL      Result Value Ref Range   WBC 12.8 (*) 4.0 - 10.5 K/uL   RBC 3.07 (*) 4.22 - 5.81 MIL/uL   Hemoglobin 9.7 (*) 13.0 - 17.0 g/dL   HCT 29.2 (*) 39.0 - 52.0 %   MCV 95.1  78.0 - 100.0 fL   MCH 31.6  26.0 - 34.0 pg   MCHC 33.2  30.0 - 36.0 g/dL   RDW 14.2  11.5 - 15.5 %   Platelets 245  150 - 400 K/uL   Neutrophils Relative % 79 (*) 43 - 77 %   Neutro Abs 10.2 (*) 1.7 - 7.7 K/uL   Lymphocytes Relative 9 (*) 12 - 46 %   Lymphs Abs 1.1  0.7 - 4.0 K/uL   Monocytes Relative 10  3 - 12 %   Monocytes Absolute 1.2 (*) 0.1 - 1.0 K/uL   Eosinophils Relative 2  0 - 5 %   Eosinophils Absolute 0.3  0.0 - 0.7 K/uL   Basophils Relative 0  0 - 1 %   Basophils Absolute 0.0  0.0 - 0.1 K/uL  BASIC METABOLIC PANEL      Result Value Ref Range   Sodium 137  137 - 147 mEq/L   Potassium 4.8  3.7 - 5.3 mEq/L   Chloride 99  96 - 112 mEq/L   CO2 22  19 - 32 mEq/L   Glucose, Bld 281 (*) 70 - 99 mg/dL   BUN 40 (*) 6 - 23 mg/dL   Creatinine, Ser 1.78 (*) 0.50 - 1.35 mg/dL   Calcium 9.2  8.4 - 10.5 mg/dL   GFR calc non Af Amer 36 (*) >90 mL/min   GFR calc Af Amer 42 (*) >90 mL/min   Dg Foot Complete Right  08/02/2013   CLINICAL DATA:  Recent injury  EXAM: RIGHT FOOT COMPLETE - 3+ VIEW  COMPARISON:  None.  FINDINGS: Hallux valgus deformity is noted. No acute fracture or dislocation is seen. Calcaneal spurs are noted. Mild tarsal degenerative changes are seen. Generalized soft tissue swelling is noted.  IMPRESSION: Soft tissue swelling without acute bony abnormality.   Electronically Signed   By: Inez Catalina M.D.   On: 08/02/2013 11:31    Patient to some subtle concerns to include a low-grade  fever 100.8. Leukocytosis of 12.8. Some elevation in his blood glucose to 289 no evidence of acidosis. In some increased pain in his right foot in a patient that normally has minimal sensation there. Some pain on the dorsum of the foot will do redness around the abrasion wound. Then a large bullea to the medial aspect of his right heel with some erythema around that as well. Think is probably best to admit him to observation overnight some IV antibiotics and see whether he improves and certainly if he gets worse in the admission would be appropriate.  Mervin Kung, MD 08/02/13 1322

## 2013-08-02 NOTE — ED Notes (Signed)
Attempted report 

## 2013-08-03 ENCOUNTER — Inpatient Hospital Stay (HOSPITAL_COMMUNITY): Payer: Medicare Other

## 2013-08-03 DIAGNOSIS — N183 Chronic kidney disease, stage 3 unspecified: Secondary | ICD-10-CM | POA: Diagnosis present

## 2013-08-03 DIAGNOSIS — I10 Essential (primary) hypertension: Secondary | ICD-10-CM | POA: Diagnosis present

## 2013-08-03 LAB — IRON AND TIBC
Iron: <10 ug/dL — ABNORMAL LOW (ref 42–135)
UIBC: 168 ug/dL (ref 125–400)

## 2013-08-03 LAB — COMPREHENSIVE METABOLIC PANEL
ALT: 10 U/L (ref 0–53)
AST: 11 U/L (ref 0–37)
Albumin: 2.4 g/dL — ABNORMAL LOW (ref 3.5–5.2)
Alkaline Phosphatase: 64 U/L (ref 39–117)
BUN: 39 mg/dL — ABNORMAL HIGH (ref 6–23)
CALCIUM: 9 mg/dL (ref 8.4–10.5)
CO2: 23 meq/L (ref 19–32)
CREATININE: 1.98 mg/dL — AB (ref 0.50–1.35)
Chloride: 104 mEq/L (ref 96–112)
GFR calc Af Amer: 37 mL/min — ABNORMAL LOW (ref >90)
GFR, EST NON AFRICAN AMERICAN: 32 mL/min — AB (ref >90)
Glucose, Bld: 98 mg/dL (ref 70–99)
Potassium: 4.2 mEq/L (ref 3.7–5.3)
SODIUM: 140 meq/L (ref 137–147)
TOTAL PROTEIN: 6.1 g/dL (ref 6.0–8.3)
Total Bilirubin: 0.5 mg/dL (ref 0.3–1.2)

## 2013-08-03 LAB — GLUCOSE, CAPILLARY
GLUCOSE-CAPILLARY: 163 mg/dL — AB (ref 70–99)
GLUCOSE-CAPILLARY: 162 mg/dL — AB (ref 70–99)
GLUCOSE-CAPILLARY: 204 mg/dL — AB (ref 70–99)
Glucose-Capillary: 94 mg/dL (ref 70–99)

## 2013-08-03 LAB — CBC
HEMATOCRIT: 24 % — AB (ref 39.0–52.0)
HEMOGLOBIN: 8.1 g/dL — AB (ref 13.0–17.0)
MCH: 32.1 pg (ref 26.0–34.0)
MCHC: 33.8 g/dL (ref 30.0–36.0)
MCV: 95.2 fL (ref 78.0–100.0)
Platelets: 192 10*3/uL (ref 150–400)
RBC: 2.52 MIL/uL — AB (ref 4.22–5.81)
RDW: 14.5 % (ref 11.5–15.5)
WBC: 11.2 10*3/uL — ABNORMAL HIGH (ref 4.0–10.5)

## 2013-08-03 LAB — FERRITIN: Ferritin: 248 ng/mL (ref 22–322)

## 2013-08-03 LAB — MRSA PCR SCREENING
MRSA BY PCR: NEGATIVE
MRSA by PCR: NEGATIVE

## 2013-08-03 MED ORDER — PIOGLITAZONE HCL 30 MG PO TABS
30.0000 mg | ORAL_TABLET | Freq: Every day | ORAL | Status: DC
  Administered 2013-08-03 – 2013-08-06 (×4): 30 mg via ORAL
  Filled 2013-08-03 (×4): qty 1

## 2013-08-03 MED ORDER — GADOBENATE DIMEGLUMINE 529 MG/ML IV SOLN
10.0000 mL | Freq: Once | INTRAVENOUS | Status: AC | PRN
  Administered 2013-08-03: 7 mL via INTRAVENOUS

## 2013-08-03 MED ORDER — SODIUM CHLORIDE 0.9 % IV SOLN
INTRAVENOUS | Status: DC
  Administered 2013-08-03 – 2013-08-05 (×3): via INTRAVENOUS

## 2013-08-03 MED ORDER — DEXTROSE 5 % IV SOLN
1.0000 g | Freq: Two times a day (BID) | INTRAVENOUS | Status: AC
  Administered 2013-08-03 – 2013-08-04 (×3): 1 g via INTRAVENOUS
  Filled 2013-08-03 (×3): qty 10

## 2013-08-03 MED ORDER — INSULIN GLARGINE 100 UNIT/ML ~~LOC~~ SOLN
5.0000 [IU] | Freq: Every day | SUBCUTANEOUS | Status: DC
  Administered 2013-08-03 – 2013-08-06 (×4): 5 [IU] via SUBCUTANEOUS
  Filled 2013-08-03 (×4): qty 0.05

## 2013-08-03 MED ORDER — ASPIRIN 81 MG PO CHEW
81.0000 mg | CHEWABLE_TABLET | Freq: Every day | ORAL | Status: DC
  Administered 2013-08-03 – 2013-08-06 (×4): 81 mg via ORAL
  Filled 2013-08-03 (×3): qty 1

## 2013-08-03 NOTE — Progress Notes (Signed)
Subjective: No complaints  Objective: Vital signs in last 24 hours: Temp:  [98.6 F (37 C)-101.8 F (38.8 C)] 99.9 F (37.7 C) (04/02 [id number]) Pulse Rate:  [80-106] 89 (04/02 0611) Resp:  [18-20] 18 (04/02 0611) BP: (111-148)/(43-95) 128/43 mmHg (04/02 0611) SpO2:  [92 %-100 %] 92 % (04/02 0611) Weight:  [92.987 kg (205 lb)] 92.987 kg (205 lb) (04/01 1543) Weight change:  Last BM Date: 08/01/13  Intake/Output from previous day: 04/01 0701 - 04/02 0700 In: 480 [P.O.:480] Out: 500 [Urine:500] Intake/Output this shift:    General appearance: alert and cooperative Resp: clear to auscultation bilaterally Cardio: regular rate and rhythm, S1, S2 normal, no murmur, click, rub or gallop Extremities: less erythema and swelling R foot  Lab Results:  Recent Labs  08/02/13 1040 08/03/13 0501  WBC 12.8* 11.2*  HGB 9.7* 8.1*  HCT 29.2* 24.0*  PLT 245 192   BMET  Recent Labs  08/02/13 1040 08/03/13 0501  NA 137 140  K 4.8 4.2  CL 99 104  CO2 22 23  GLUCOSE 281* 98  BUN 40* 39*  CREATININE 1.78* 1.98*  CALCIUM 9.2 9.0    Studies/Results: Dg Foot Complete Right  08/02/2013   CLINICAL DATA:  Recent injury  EXAM: RIGHT FOOT COMPLETE - 3+ VIEW  COMPARISON:  None.  FINDINGS: Hallux valgus deformity is noted. No acute fracture or dislocation is seen. Calcaneal spurs are noted. Mild tarsal degenerative changes are seen. Generalized soft tissue swelling is noted.  IMPRESSION: Soft tissue swelling without acute bony abnormality.   Electronically Signed   By: Inez Catalina M.D.   On: 08/02/2013 11:31    Medications: I have reviewed the patient's current medications.  Assessment/Plan: Principal Problem:   Cellulitis of foot, MRI this am to r/o abscess.  Looks better on vancomycin, suspect Staph Active Problems:   MS (multiple sclerosis) pseudoexacerbation, ask Pt to see Re:ROM and transfers   Diabetes mellitus type 2 in obese on SSI, restart oral med and add small dose Lantus  Chronic kidney disease, stage 3 creatinine up a little, receiving MRI with contrast today (1/2 dose), start IVF.   Essential hypertension, benign controlled, med on hold   Anemia, check iron studies   LOS: 1 day   Lance [name] JOSEPH 08/03/2013, 7:46 AM

## 2013-08-03 NOTE — Evaluation (Signed)
Physical Therapy Evaluation Patient Details Name: Lance [name] MRN: [id number] [name]: Dec 02, 1940 Today's Date: 08/03/2013   History of Present Illness  73 y.o. male admitted to [facility] on 08/02/13 with cellulitis and R foot wound.  He also has mild leukocytosis.  MRI does not seem to show any osteomyelitis or deep tissue infection.  He is being tx with antibiotics.  Pertinant PMHx of MS, HTN, DM, CKD III, and anemia.    Clinical Impression  Pt is weak and debilitated from both the hospitalization and the MS.  The question is: can he go home at this level.  The evaluation was an unfair assessment of his actual ability to mobilize as I did not have the equipment with me needed to simulate his home situation (a drop arm anything and a slide board). So we did an A/P transfer to the [facility] and used the sara lift to get to the chair.  As long as he can demonstrate the ability to do a slide board or scoot transfer (level surface) into/onto something (WC, drop arm BSC) then I think he can go home with some home therapy f/u.  He may need a hospital bed as he had quite a bit of difficulty with bed mobility.   PT to follow acutely for deficits listed below.     Follow Up Recommendations Home health PT;Supervision for mobility/OOB    Equipment Recommendations  None recommended by PT    Recommendations for Other Services   None- he politely decline OT services    Precautions / Restrictions Precautions Precautions: Fall Precaution Comments: bil leg weakness      Mobility  Bed Mobility Overal bed mobility: Needs Assistance Bed Mobility: Supine to Sit     Supine to sit: Mod assist     General bed mobility comments: mod assist to support trunk to get to sitting. He needed assist of bil legs and HOB was maximally elevated.    Transfers Overall transfer level: Needs assistance Equipment used: None (none to [employer], sara to the chair from [facility]) Transfers: Ecologist transfers: +2 physical assistance;Max assist   General transfer comment: two person max assist to help pt scoot posteriorly onto the [facility].  There was no drop arm BSC in the room.    Ambulation/Gait             General Gait Details: pt has been non ambulatory for a while and gets about his home with his electric WC and his scooter.           Balance Overall balance assessment: Needs assistance Sitting-balance support: Feet supported;Bilateral upper extremity supported Sitting balance-Leahy Scale: Poor Sitting balance - Comments: needs bil upper extremity support.                                      Pertinent Vitals/Pain See vitals flow sheet.     Home Living Family/patient expects to be discharged to:: Private residence Living Arrangements: Spouse/significant other   Type of Home: House (townhome) Home Access: Ramped entrance     [place]: Full bath on main level;Two level (bathroom is accessible with the scooter) Home Equipment: Wheelchair - power;Tub bench;Grab bars - tub/shower;Grab bars - toilet (power scooter, power WC has a tilt mechanism.  ) Additional Comments: "roll in shower" , regular bed that they modified.  He is interested in one of the  up and down sleep number beds. He has a normal wooden slide board as well.      Prior Function Level of Independence: Independent with assistive device(s)         Comments: scoot transfers to power chair un assisted.  Usually can transfer into and out of the car, but when he feels weak he uses the [employer].          Extremity/Trunk Assessment   Upper Extremity Assessment: RUE deficits/detail;LUE deficits/detail RUE Deficits / Details: strength seems to be equal bil.  Strength 4/5 throughout.       LUE Deficits / Details: strength seems to be equal bil.  Strength 4/5 throughout.     Lower Extremity Assessment: RLE deficits/detail;LLE deficits/detail RLE Deficits /  Details: right leg is weaker than left leg and has ~2/5 strength knee and ankle.  Right foot large blister and is in blue PRAFO boot to keep the foot protected.  This boot was left on during the entire session.   LLE Deficits / Details: Left leg is stronger of the two legs, but still weak, 2+/5 hip, knee and 3-/5 ankle DF.   Cervical / Trunk Assessment: Normal  Communication   Communication: No difficulties  Cognition Arousal/Alertness: Awake/alert Behavior During Therapy: WFL for tasks assessed/performed Overall Cognitive Status: Within Functional Limits for tasks assessed                      General Comments General comments (skin integrity, edema, etc.): We will need to simulate in future sessions a more realistic home type scenario where he deomonstrates his ability to transfer level surface into a WC by scooting or using a slide board.      Exercises Other Exercises Other Exercises: PROM to bil legs ankle DF produces clonus bil, knee/hip flexion and hip abduction bil      Assessment/Plan    PT Assessment Patient needs continued PT services  PT Diagnosis Generalized weakness   PT Problem List Decreased strength;Decreased activity tolerance;Decreased range of motion;Decreased balance;Decreased mobility;Decreased coordination;Impaired sensation;Impaired tone;Decreased skin integrity  PT Treatment Interventions DME instruction;Functional mobility training;Therapeutic activities;Therapeutic exercise;Balance training;Neuromuscular re-education;Patient/family education;Wheelchair mobility training   PT Goals (Current goals can be found in the Care Plan section) Acute Rehab PT Goals Patient Stated Goal: to get stronger and get out of here PT Goal Formulation: With patient Time For Goal Achievement: 08/17/13 Potential to Achieve Goals: Good    Frequency Min 3X/week    End of Session Equipment Utilized During Treatment: Gait belt;Other (comment) (sara lift) Activity  Tolerance: Patient limited by fatigue Patient left: in chair;with call bell/phone within reach Nurse Communication: Mobility status         Time: [phone] (some time spent toileting, did not bill for this time 30 min) PT Time Calculation (min): 102 min   Charges:   PT Evaluation $Initial PT Evaluation Tier I: 1 Procedure PT Treatments $Therapeutic Activity: 53-67 mins        Darby Fleeman B. Corcoran, [place], DPT [phone]   08/03/2013, 6:43 PM

## 2013-08-04 LAB — CBC
HCT: 22.7 % — ABNORMAL LOW (ref 39.0–52.0)
Hemoglobin: 7.6 g/dL — ABNORMAL LOW (ref 13.0–17.0)
MCH: 31.4 pg (ref 26.0–34.0)
MCHC: 33.5 g/dL (ref 30.0–36.0)
MCV: 93.8 fL (ref 78.0–100.0)
Platelets: 176 10*3/uL (ref 150–400)
RBC: 2.42 MIL/uL — AB (ref 4.22–5.81)
RDW: 14.4 % (ref 11.5–15.5)
WBC: 8.9 10*3/uL (ref 4.0–10.5)

## 2013-08-04 LAB — BASIC METABOLIC PANEL
BUN: 38 mg/dL — AB (ref 6–23)
CHLORIDE: 105 meq/L (ref 96–112)
CO2: 23 mEq/L (ref 19–32)
Calcium: 8.6 mg/dL (ref 8.4–10.5)
Creatinine, Ser: 1.74 mg/dL — ABNORMAL HIGH (ref 0.50–1.35)
GFR, EST AFRICAN AMERICAN: 43 mL/min — AB (ref >90)
GFR, EST NON AFRICAN AMERICAN: 37 mL/min — AB (ref >90)
Glucose, Bld: 177 mg/dL — ABNORMAL HIGH (ref 70–99)
POTASSIUM: 4.2 meq/L (ref 3.7–5.3)
SODIUM: 140 meq/L (ref 137–147)

## 2013-08-04 LAB — GLUCOSE, CAPILLARY
Glucose-Capillary: 114 mg/dL — ABNORMAL HIGH (ref 70–99)
Glucose-Capillary: 205 mg/dL — ABNORMAL HIGH (ref 70–99)
Glucose-Capillary: 139 mg/dL — ABNORMAL HIGH (ref 70–99)
Glucose-Capillary: 154 mg/dL — ABNORMAL HIGH (ref 70–99)

## 2013-08-04 MED ORDER — SULFAMETHOXAZOLE-TMP DS 800-160 MG PO TABS
1.0000 | ORAL_TABLET | Freq: Two times a day (BID) | ORAL | Status: DC
  Administered 2013-08-05 – 2013-08-06 (×3): 1 via ORAL
  Filled 2013-08-04 (×4): qty 1

## 2013-08-04 NOTE — Progress Notes (Signed)
Assessment/Plan: Principal Problem:   Cellulitis of foot - this is much better. Culture from office showed Serratia marcescens. I doubt this is cause but he is on both ceftriaxone and vancomycin. I suspect this is staph/strep. In any event, plan abx IV today, change to Septra in AM po and d/c 4/5 if no worsening. With his MS, I think we need to be cautious so that we don't have a readmit. Will need HHPT and HHRN. Dr. Inda Merlin will evaluate for possible drainage of bulla in AM Active Problems:   MS (multiple sclerosis) pseudoexacerbation   Diabetes mellitus type 2 in obese   Chronic kidney disease, stage 3   Essential hypertension, benign   Subjective: Feels fine. Foot is much less red. Little pain  Objective:  Vital Signs: Filed Vitals:   08/03/13 0611 08/03/13 1420 08/03/13 2143 08/04/13 0454  BP: 128/43 126/58 135/52 128/50  Pulse: 89 104 85 86  Temp: 99.9 F (37.7 C) 98.3 F (36.8 C) 98.3 F (36.8 C) 99.7 F (37.6 C)  TempSrc: Oral Oral Oral Oral  Resp: 18 18 18 18   Height:      Weight:      SpO2: 92% 97% 98% 94%     EXAM: minimal erythema around open wound. Large bulla on right heel   Intake/Output Summary (Last 24 hours) at 08/04/13 1033 Last data filed at 08/04/13 0455  Gross per 24 hour  Intake 973.33 ml  Output   1200 ml  Net -226.67 ml    Lab Results:  Recent Labs  08/03/13 0501 08/04/13 0355  NA 140 140  K 4.2 4.2  CL 104 105  CO2 23 23  GLUCOSE 98 177*  BUN 39* 38*  CREATININE 1.98* 1.74*  CALCIUM 9.0 8.6    Recent Labs  08/03/13 0501  AST 11  ALT 10  ALKPHOS 64  BILITOT 0.5  PROT 6.1  ALBUMIN 2.4*   No results found for this basename: LIPASE, AMYLASE,  in the last 72 hours  Recent Labs  08/02/13 1040 08/03/13 0501 08/04/13 0355  WBC 12.8* 11.2* 8.9  NEUTROABS 10.2*  --   --   HGB 9.7* 8.1* 7.6*  HCT 29.2* 24.0* 22.7*  MCV 95.1 95.2 93.8  PLT 245 192 176   No results found for this basename: CKTOTAL, CKMB, CKMBINDEX,  TROPONINI,  in the last 72 hours BNP No results found for this basename: probnp   No results found for this basename: DDIMER,  in the last 72 hours No results found for this basename: HGBA1C,  in the last 72 hours No results found for this basename: CHOL, HDL, LDLCALC, TRIG, CHOLHDL, LDLDIRECT,  in the last 72 hours No results found for this basename: TSH, T4TOTAL, FREET3, T3FREE, THYROIDAB,  in the last 72 hours  Recent Labs  08/03/13 1235  FERRITIN 248  TIBC Not calculated due to Iron <10.  IRON <10*    Studies/Results: Mr Foot Right W Wo Contrast  08/03/2013   CLINICAL DATA:  Diabetic with recent palpation antibiotic therapy forefoot infection. Subsequent injury with increased swelling, pain, erythema and developing bursa under right heel.  EXAM: MRI OF THE RIGHT HINDFOOT WITHOUT AND WITH CONTRAST  TECHNIQUE: Multiplanar, multisequence MR imaging was performed both before and after administration of intravenous contrast.  CONTRAST:  63mL MULTIHANCE GADOBENATE DIMEGLUMINE 529 MG/ML IV SOLN  COMPARISON:  DG FOOT COMPLETE*R* dated 08/02/2013; DG ANKLE COMPLETE*R* dated 03/30/2011  FINDINGS: There is a very superficial exophytic fluid collection medial to the hindfoot  which is likely within the skin or subdermal. This measures 2.5 x 7.1 x 7.8 cm and demonstrates T2 hyperintensity and no central enhancement following contrast. There is mild adjacent enhancement within the subcutaneous fat. There is generalized subcutaneous edema surrounding the ankle. No other focal fluid collections are identified.  There is no significant ankle joint effusion or synovial enhancement. There is no evidence of acute fracture, dislocation, bone destruction or significant arthropathy.  The peroneal, medial flexor, anterior extensor and Achilles tendons appear normal. The plantar fascia appears normal. There is mild edema and enhancement with plantar forefoot musculature.  IMPRESSION: 1. Very superficial fluid collection  medially in the hindfoot, likely a large skin or subdermal blister. This is potentially infected but would be easily aspirated. 2. No evidence of deep soft tissue infection, intra-articular infection or osteomyelitis. 3. Mild nonspecific subcutaneous edema surrounding the ankle.   Electronically Signed   By: Camie Patience M.D.   On: 08/03/2013 10:16   Dg Foot Complete Right  08/02/2013   CLINICAL DATA:  Recent injury  EXAM: RIGHT FOOT COMPLETE - 3+ VIEW  COMPARISON:  None.  FINDINGS: Hallux valgus deformity is noted. No acute fracture or dislocation is seen. Calcaneal spurs are noted. Mild tarsal degenerative changes are seen. Generalized soft tissue swelling is noted.  IMPRESSION: Soft tissue swelling without acute bony abnormality.   Electronically Signed   By: Inez Catalina M.D.   On: 08/02/2013 11:31   Medications: Medications administered in the last 24 hours reviewed.  Current Medication List reviewed.    LOS: 2 days   [facility] @ [facility] [phone]) 08/04/2013, 10:33 AM

## 2013-08-05 LAB — CBC
HCT: 23.6 % — ABNORMAL LOW (ref 39.0–52.0)
Hemoglobin: 7.7 g/dL — ABNORMAL LOW (ref 13.0–17.0)
MCH: 31.2 pg (ref 26.0–34.0)
MCHC: 32.6 g/dL (ref 30.0–36.0)
MCV: 95.5 fL (ref 78.0–100.0)
Platelets: 187 10*3/uL (ref 150–400)
RBC: 2.47 MIL/uL — ABNORMAL LOW (ref 4.22–5.81)
RDW: 14.2 % (ref 11.5–15.5)
WBC: 7.6 10*3/uL (ref 4.0–10.5)

## 2013-08-05 LAB — BASIC METABOLIC PANEL
BUN: 31 mg/dL — ABNORMAL HIGH (ref 6–23)
CO2: 20 mEq/L (ref 19–32)
Calcium: 8.2 mg/dL — ABNORMAL LOW (ref 8.4–10.5)
Chloride: 109 mEq/L (ref 96–112)
Creatinine, Ser: 1.43 mg/dL — ABNORMAL HIGH (ref 0.50–1.35)
GFR calc Af Amer: 55 mL/min — ABNORMAL LOW (ref >90)
GFR calc non Af Amer: 47 mL/min — ABNORMAL LOW (ref >90)
Glucose, Bld: 126 mg/dL — ABNORMAL HIGH (ref 70–99)
Potassium: 4 mEq/L (ref 3.7–5.3)
Sodium: 144 mEq/L (ref 137–147)

## 2013-08-05 LAB — RETICULOCYTES
RBC.: 2.47 MIL/uL — ABNORMAL LOW (ref 4.22–5.81)
Retic Count, Absolute: 29.6 10*3/uL (ref 19.0–186.0)
Retic Ct Pct: 1.2 % (ref 0.4–3.1)

## 2013-08-05 LAB — VITAMIN B12: Vitamin B-12: 502 pg/mL (ref 211–911)

## 2013-08-05 LAB — GLUCOSE, CAPILLARY
GLUCOSE-CAPILLARY: 122 mg/dL — AB (ref 70–99)
GLUCOSE-CAPILLARY: 184 mg/dL — AB (ref 70–99)
Glucose-Capillary: 144 mg/dL — ABNORMAL HIGH (ref 70–99)
Glucose-Capillary: 187 mg/dL — ABNORMAL HIGH (ref 70–99)

## 2013-08-05 LAB — FERRITIN: Ferritin: 354 ng/mL — ABNORMAL HIGH (ref 22–322)

## 2013-08-05 LAB — IRON AND TIBC
Iron: 42 ug/dL (ref 42–135)
Saturation Ratios: 26 % (ref 20–55)
TIBC: 162 ug/dL — ABNORMAL LOW (ref 215–435)
UIBC: 120 ug/dL — ABNORMAL LOW (ref 125–400)

## 2013-08-05 LAB — FOLATE: Folate: 12.8 ng/mL

## 2013-08-05 MED ORDER — SODIUM CHLORIDE 0.9 % IV SOLN
510.0000 mg | Freq: Once | INTRAVENOUS | Status: AC
  Administered 2013-08-05: 510 mg via INTRAVENOUS
  Filled 2013-08-05: qty 17

## 2013-08-05 MED ORDER — BISACODYL 10 MG RE SUPP: 10.0000 mg | Freq: Every day | RECTAL | Status: DC | PRN

## 2013-08-05 MED ORDER — GLUCERNA SHAKE PO LIQD
237.0000 mL | Freq: Three times a day (TID) | ORAL | Status: DC
  Administered 2013-08-05 – 2013-08-06 (×4): 237 mL via ORAL

## 2013-08-05 MED ORDER — FERROUS SULFATE 325 (65 FE) MG PO TABS
325.0000 mg | ORAL_TABLET | Freq: Every day | ORAL | Status: DC
  Administered 2013-08-06: 325 mg via ORAL
  Filled 2013-08-05: qty 1

## 2013-08-05 NOTE — Care Management Note (Signed)
CARE MANAGEMENT NOTE 08/05/2013  Patient:  Lance [name], Lance [name]   Account Number:  [id number]  Date Initiated:  08/05/2013  Documentation initiated by:  Ricki Miller  Subjective/Objective Assessment:   73 yr old male admitted with left foot cellulitis.     Action/Plan:   Case manager spoke with patient concerning home health needs. Choice offered. Patient states he uses [facility], CM will make referral.  Patient states his wife and family will assist at discharge.Would like same therapist if available.   Anticipated DC Date:  08/06/2013   Anticipated DC Plan:  [place]  CM consult      [facility] Choice  HOME HEALTH   Choice offered to / List presented to:  C-1 Patient        [place] arranged  HH-1 RN      [place].   Status of service:  Completed, signed off Medicare Important Message given?   (If response is "NO", the following Medicare IM given date fields will be blank) Date Medicare IM given:   Date Additional Medicare IM given:    Discharge Disposition:  [place]

## 2013-08-05 NOTE — Progress Notes (Addendum)
Subjective: Lance [name] is doing okay overall.  CBGs are okay.  Large medial right heel blister is not painful.  Dorsum of foot is no longer erythematous or swollen.  Has been transitioned over to oral antibiotics.  Objective: Weight change:   Intake/Output Summary (Last 24 hours) at 08/05/13 1011 Last data filed at 08/05/13 0558  Gross per 24 hour  Intake 2044.67 ml  Output   2150 ml  Net -105.33 ml   Filed Vitals:   08/04/13 0454 08/04/13 1347 08/04/13 2205 08/05/13 0557  BP: 128/50 129/66 136/60 131/49  Pulse: 86 84 83 83  Temp: 99.7 F (37.6 C) 98.1 F (36.7 C) 98 F (36.7 C) 98.7 F (37.1 C)  TempSrc: Oral Oral Oral Oral  Resp: '18 16 17 18  ' Height:      Weight:      SpO2: 94% 98% 97% 98%    General Appearance: Alert, cooperative, no distress, appears stated age Lungs: Clear to auscultation bilaterally, respirations unlabored Heart: Regular rate and rhythm, S1 and S2 normal, no murmur, rub or gallop Abdomen: Soft, non-tender, bowel sounds active all four quadrants, no masses, no organomegaly Extremities: Right heel with 8 by 6 centimeter bullous lesion. Neuro:  Patient has severe MS and has diffuse weakness with marked inability to move lower extremities  Procedure:  Along the lower dependent aspect of right heel blister, this area was prepped with Betadine and using a sterile 18-gauge needle, approximately 5 puncture wounds were made through the surface of the blister and mild pressure was applied to the posterior through a sterile gauze and the blisters fluid was evacuated.  Clear and xanthochromic.  No evidence of pus or foul smell to suggest infection.  Patient tolerated well  Lab Results: Results for orders placed during the hospital encounter of 08/02/13 (from the past 48 hour(s))  MRSA PCR SCREENING     Status: None   Collection Time    08/03/13 11:30 AM      Result Value Ref Range   MRSA by PCR NEGATIVE  NEGATIVE   Comment:            The GeneXpert MRSA  Assay (FDA     approved for NASAL specimens     only), is one component of a     comprehensive MRSA colonization     surveillance program. It is not     intended to diagnose MRSA     infection nor to guide or     monitor treatment for     MRSA infections.  GLUCOSE, CAPILLARY     Status: Abnormal   Collection Time    08/03/13 11:58 AM      Result Value Ref Range   Glucose-Capillary 163 (*) 70 - 99 mg/dL  IRON AND TIBC     Status: Abnormal   Collection Time    08/03/13 12:35 PM      Result Value Ref Range   Iron <10 (*) 42 - 135 ug/dL   TIBC Not calculated due to Iron <10.  215 - 435 ug/dL   Saturation Ratios Not calculated due to Iron <10.  20 - 55 %   UIBC 168  125 - 400 ug/dL   Comment: Performed at [facility]     Status: None   Collection Time    08/03/13 12:35 PM      Result Value Ref Range   Ferritin 248  22 - 322 ng/mL   Comment: Performed at [facility]  GLUCOSE, CAPILLARY     Status: Abnormal   Collection Time    08/03/13  4:42 PM      Result Value Ref Range   Glucose-Capillary 162 (*) 70 - 99 mg/dL  GLUCOSE, CAPILLARY     Status: Abnormal   Collection Time    08/03/13  9:56 PM      Result Value Ref Range   Glucose-Capillary 204 (*) 70 - 99 mg/dL  BASIC METABOLIC PANEL     Status: Abnormal   Collection Time    08/04/13  3:55 AM      Result Value Ref Range   Sodium 140  137 - 147 mEq/L   Potassium 4.2  3.7 - 5.3 mEq/L   Chloride 105  96 - 112 mEq/L   CO2 23  19 - 32 mEq/L   Glucose, Bld 177 (*) 70 - 99 mg/dL   BUN 38 (*) 6 - 23 mg/dL   Creatinine, Ser 1.74 (*) 0.50 - 1.35 mg/dL   Calcium 8.6  8.4 - 10.5 mg/dL   GFR calc non Af Amer 37 (*) >90 mL/min   GFR calc Af Amer 43 (*) >90 mL/min   Comment: (NOTE)     The eGFR has been calculated using the CKD EPI equation.     This calculation has not been validated in all clinical situations.     eGFR's persistently <90 mL/min signify possible Chronic Kidney     Disease.  CBC      Status: Abnormal   Collection Time    08/04/13  3:55 AM      Result Value Ref Range   WBC 8.9  4.0 - 10.5 K/uL   RBC 2.42 (*) 4.22 - 5.81 MIL/uL   Hemoglobin 7.6 (*) 13.0 - 17.0 g/dL   HCT 22.7 (*) 39.0 - 52.0 %   MCV 93.8  78.0 - 100.0 fL   MCH 31.4  26.0 - 34.0 pg   MCHC 33.5  30.0 - 36.0 g/dL   RDW 14.4  11.5 - 15.5 %   Platelets 176  150 - 400 K/uL  GLUCOSE, CAPILLARY     Status: Abnormal   Collection Time    08/04/13  7:39 AM      Result Value Ref Range   Glucose-Capillary 114 (*) 70 - 99 mg/dL  GLUCOSE, CAPILLARY     Status: Abnormal   Collection Time    08/04/13 11:51 AM      Result Value Ref Range   Glucose-Capillary 205 (*) 70 - 99 mg/dL  GLUCOSE, CAPILLARY     Status: Abnormal   Collection Time    08/04/13  5:11 PM      Result Value Ref Range   Glucose-Capillary 139 (*) 70 - 99 mg/dL  GLUCOSE, CAPILLARY     Status: Abnormal   Collection Time    08/04/13  9:59 PM      Result Value Ref Range   Glucose-Capillary 154 (*) 70 - 99 mg/dL  RETICULOCYTES     Status: Abnormal   Collection Time    08/05/13  4:09 AM      Result Value Ref Range   Retic Ct Pct 1.2  0.4 - 3.1 %   RBC. 2.47 (*) 4.22 - 5.81 MIL/uL   Retic Count, Manual 29.6  19.0 - 186.0 K/uL  CBC     Status: Abnormal   Collection Time    08/05/13  4:09 AM      Result Value Ref Range  WBC 7.6  4.0 - 10.5 K/uL   RBC 2.47 (*) 4.22 - 5.81 MIL/uL   Hemoglobin 7.7 (*) 13.0 - 17.0 g/dL   HCT 23.6 (*) 39.0 - 52.0 %   MCV 95.5  78.0 - 100.0 fL   MCH 31.2  26.0 - 34.0 pg   MCHC 32.6  30.0 - 36.0 g/dL   RDW 14.2  11.5 - 15.5 %   Platelets 187  150 - 400 K/uL  BASIC METABOLIC PANEL     Status: Abnormal   Collection Time    08/05/13  4:09 AM      Result Value Ref Range   Sodium 144  137 - 147 mEq/L   Potassium 4.0  3.7 - 5.3 mEq/L   Chloride 109  96 - 112 mEq/L   CO2 20  19 - 32 mEq/L   Glucose, Bld 126 (*) 70 - 99 mg/dL   BUN 31 (*) 6 - 23 mg/dL   Creatinine, Ser 1.43 (*) 0.50 - 1.35 mg/dL   Calcium 8.2  (*) 8.4 - 10.5 mg/dL   GFR calc non Af Amer 47 (*) >90 mL/min   GFR calc Af Amer 55 (*) >90 mL/min   Comment: (NOTE)     The eGFR has been calculated using the CKD EPI equation.     This calculation has not been validated in all clinical situations.     eGFR's persistently <90 mL/min signify possible Chronic Kidney     Disease.  GLUCOSE, CAPILLARY     Status: Abnormal   Collection Time    08/05/13  7:47 AM      Result Value Ref Range   Glucose-Capillary 122 (*) 70 - 99 mg/dL    Studies/Results: No results found. Medications: Scheduled Meds: . aspirin  81 mg Oral Daily  . heparin  5,000 Units Subcutaneous 3 times per day  . insulin aspart  0-15 Units Subcutaneous TID WC  . insulin aspart  0-5 Units Subcutaneous QHS  . insulin glargine  5 Units Subcutaneous Daily  . oxybutynin  5 mg Oral BID  . pioglitazone  30 mg Oral Daily  . pneumococcal 23 valent vaccine  0.5 mL Intramuscular Tomorrow-1000  . simvastatin  20 mg Oral QPM  . sulfamethoxazole-trimethoprim  1 tablet Oral Q12H   Continuous Infusions: . sodium chloride 100 mL/hr at 08/04/13 1817   PRN Meds:.acetaminophen, acetaminophen, morphine injection, ondansetron (ZOFRAN) IV, ondansetron  Assessment/Plan: Principal Problem:   Cellulitis of foot - resolving nicely.  Tolerating Bactrim well.  Likely discharge in a.m. Active Problems:   MS (multiple sclerosis) pseudoexacerbation - start physical therapy and occupational therapy at home   Diabetes mellitus type 2 in obese - low sugars relatively well controlled   Chronic kidney disease, stage 3 - stable   Essential hypertension, benign - controlled   Nutrition - patient states that he is not eating that well and we'll add Glucerna 237 milliliters between meals - current diet is not very                        palatable to him   Constipation - try Dulcolax suppository today.  Has not had a BM since hospitalized several days ago   Iron deficiency - patient appears to be iron  deficient though ferritin level is normal, likely high secondary to acute inflammation.  Will load with IV iron today and start oral iron tomorrow at discharge   Right foot blister - sterilely drained today.  No evidence of infection.  Dressed with sterile gauze and Kerlix wrap daily until skin starts                                 to peel and then use Silvadene cream daily with Telfa pad and Kerlix wrap until skin has healed underneath.                                           Continue Bunny boot for several weeks following complete healing of the blister    LOS: 3 days   Henrine Screws, MD 08/05/2013, 10:11 AM

## 2013-08-06 LAB — CBC WITH DIFFERENTIAL/PLATELET
BASOS PCT: 0 % (ref 0–1)
Basophils Absolute: 0 10*3/uL (ref 0.0–0.1)
Eosinophils Absolute: 0.2 10*3/uL (ref 0.0–0.7)
Eosinophils Relative: 5 % (ref 0–5)
HCT: 23.1 % — ABNORMAL LOW (ref 39.0–52.0)
HEMOGLOBIN: 7.8 g/dL — AB (ref 13.0–17.0)
LYMPHS ABS: 1.1 10*3/uL (ref 0.7–4.0)
Lymphocytes Relative: 25 % (ref 12–46)
MCH: 31.8 pg (ref 26.0–34.0)
MCHC: 33.8 g/dL (ref 30.0–36.0)
MCV: 94.3 fL (ref 78.0–100.0)
Monocytes Absolute: 0.2 10*3/uL (ref 0.1–1.0)
Monocytes Relative: 5 % (ref 3–12)
NEUTROS PCT: 65 % (ref 43–77)
Neutro Abs: 3 10*3/uL (ref 1.7–7.7)
Platelets: 191 10*3/uL (ref 150–400)
RBC: 2.45 MIL/uL — AB (ref 4.22–5.81)
RDW: 13.9 % (ref 11.5–15.5)
WBC: 4.6 10*3/uL (ref 4.0–10.5)

## 2013-08-06 LAB — BASIC METABOLIC PANEL WITH GFR
BUN: 26 mg/dL — ABNORMAL HIGH (ref 6–23)
CO2: 20 meq/L (ref 19–32)
Calcium: 8.4 mg/dL (ref 8.4–10.5)
Chloride: 108 meq/L (ref 96–112)
Creatinine, Ser: 1.41 mg/dL — ABNORMAL HIGH (ref 0.50–1.35)
GFR calc Af Amer: 56 mL/min — ABNORMAL LOW
GFR calc non Af Amer: 48 mL/min — ABNORMAL LOW
Glucose, Bld: 122 mg/dL — ABNORMAL HIGH (ref 70–99)
Potassium: 4.3 meq/L (ref 3.7–5.3)
Sodium: 141 meq/L (ref 137–147)

## 2013-08-06 LAB — GLUCOSE, CAPILLARY
Glucose-Capillary: 118 mg/dL — ABNORMAL HIGH (ref 70–99)
Glucose-Capillary: 178 mg/dL — ABNORMAL HIGH (ref 70–99)

## 2013-08-06 MED ORDER — SULFAMETHOXAZOLE-TMP DS 800-160 MG PO TABS: 1.0000 | ORAL_TABLET | Freq: Two times a day (BID) | ORAL | Status: AC

## 2013-08-06 MED ORDER — BISACODYL 10 MG RE SUPP: 10.0000 mg | Freq: Every day | RECTAL | Status: DC | PRN

## 2013-08-06 MED ORDER — FERROUS SULFATE 325 (65 FE) MG PO TABS: 325.0000 mg | ORAL_TABLET | Freq: Every day | ORAL | Status: DC

## 2013-08-06 NOTE — Progress Notes (Signed)
   CARE MANAGEMENT NOTE 08/06/2013  Patient:  Lance [name], Lance [name]   Account Number:  [id number]  Date Initiated:  08/05/2013  Documentation initiated by:  Ricki Miller  Subjective/Objective Assessment:   73 yr old male admitted with left foot cellulitis.     Action/Plan:   Case manager spoke with patient concerning home health needs. Choice offered. Patient states he uses [facility], CM will make referral.  Patient states his wife and family will assist at discharge.Would like same therapist if available.   Anticipated DC Date:  08/06/2013   Anticipated DC Plan:  [place]  CM consult      [facility] Choice  HOME HEALTH   Choice offered to / List presented to:  C-1 Patient        [place] arranged  HH-1 RN      [place].   Status of service:  Completed, signed off Medicare Important Message given?   (If response is "NO", the following Medicare IM given date fields will be blank) Date Medicare IM given:   Date Additional Medicare IM given:    Discharge Disposition:  [place]  Per UR Regulation:    If discussed at Long Length of Stay Meetings, dates discussed:    Comments:  08/06/13 08:44 CM notified [place] via phone of pt discharge. Pt will be receiving HHRN service from [facility].   No other CM needs were communicated.  Mariane Masters, BSN, CM [phone].
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
## 2013-08-07 DIAGNOSIS — IMO0002 Reserved for concepts with insufficient information to code with codable children: Secondary | ICD-10-CM | POA: Diagnosis not present

## 2013-08-07 DIAGNOSIS — L02619 Cutaneous abscess of unspecified foot: Secondary | ICD-10-CM | POA: Diagnosis not present

## 2013-08-07 DIAGNOSIS — G35 Multiple sclerosis: Secondary | ICD-10-CM | POA: Diagnosis not present

## 2013-08-07 DIAGNOSIS — E119 Type 2 diabetes mellitus without complications: Secondary | ICD-10-CM | POA: Diagnosis not present

## 2013-08-07 DIAGNOSIS — M5430 Sciatica, unspecified side: Secondary | ICD-10-CM | POA: Diagnosis not present

## 2013-08-07 DIAGNOSIS — Z4800 Encounter for change or removal of nonsurgical wound dressing: Secondary | ICD-10-CM | POA: Diagnosis not present

## 2013-08-07 DIAGNOSIS — L03119 Cellulitis of unspecified part of limb: Secondary | ICD-10-CM | POA: Diagnosis not present

## 2013-08-07 DIAGNOSIS — I10 Essential (primary) hypertension: Secondary | ICD-10-CM | POA: Diagnosis not present

## 2013-08-09 DIAGNOSIS — E119 Type 2 diabetes mellitus without complications: Secondary | ICD-10-CM | POA: Diagnosis not present

## 2013-08-09 DIAGNOSIS — L02619 Cutaneous abscess of unspecified foot: Secondary | ICD-10-CM | POA: Diagnosis not present

## 2013-08-09 DIAGNOSIS — IMO0002 Reserved for concepts with insufficient information to code with codable children: Secondary | ICD-10-CM | POA: Diagnosis not present

## 2013-08-09 DIAGNOSIS — M5430 Sciatica, unspecified side: Secondary | ICD-10-CM | POA: Diagnosis not present

## 2013-08-09 DIAGNOSIS — G35 Multiple sclerosis: Secondary | ICD-10-CM | POA: Diagnosis not present

## 2013-08-09 DIAGNOSIS — I10 Essential (primary) hypertension: Secondary | ICD-10-CM | POA: Diagnosis not present

## 2013-08-10 DIAGNOSIS — E119 Type 2 diabetes mellitus without complications: Secondary | ICD-10-CM | POA: Diagnosis not present

## 2013-08-10 DIAGNOSIS — I10 Essential (primary) hypertension: Secondary | ICD-10-CM | POA: Diagnosis not present

## 2013-08-10 DIAGNOSIS — G35 Multiple sclerosis: Secondary | ICD-10-CM | POA: Diagnosis not present

## 2013-08-10 DIAGNOSIS — IMO0002 Reserved for concepts with insufficient information to code with codable children: Secondary | ICD-10-CM | POA: Diagnosis not present

## 2013-08-10 DIAGNOSIS — M5430 Sciatica, unspecified side: Secondary | ICD-10-CM | POA: Diagnosis not present

## 2013-08-10 DIAGNOSIS — L02619 Cutaneous abscess of unspecified foot: Secondary | ICD-10-CM | POA: Diagnosis not present

## 2013-08-11 DIAGNOSIS — L02619 Cutaneous abscess of unspecified foot: Secondary | ICD-10-CM | POA: Diagnosis not present

## 2013-08-11 DIAGNOSIS — E119 Type 2 diabetes mellitus without complications: Secondary | ICD-10-CM | POA: Diagnosis not present

## 2013-08-11 DIAGNOSIS — I10 Essential (primary) hypertension: Secondary | ICD-10-CM | POA: Diagnosis not present

## 2013-08-11 DIAGNOSIS — G35 Multiple sclerosis: Secondary | ICD-10-CM | POA: Diagnosis not present

## 2013-08-11 DIAGNOSIS — L03119 Cellulitis of unspecified part of limb: Secondary | ICD-10-CM | POA: Diagnosis not present

## 2013-08-11 DIAGNOSIS — IMO0002 Reserved for concepts with insufficient information to code with codable children: Secondary | ICD-10-CM | POA: Diagnosis not present

## 2013-08-11 DIAGNOSIS — M5430 Sciatica, unspecified side: Secondary | ICD-10-CM | POA: Diagnosis not present

## 2013-08-14 DIAGNOSIS — I10 Essential (primary) hypertension: Secondary | ICD-10-CM | POA: Diagnosis not present

## 2013-08-14 DIAGNOSIS — E119 Type 2 diabetes mellitus without complications: Secondary | ICD-10-CM | POA: Diagnosis not present

## 2013-08-14 DIAGNOSIS — M5430 Sciatica, unspecified side: Secondary | ICD-10-CM | POA: Diagnosis not present

## 2013-08-14 DIAGNOSIS — L02619 Cutaneous abscess of unspecified foot: Secondary | ICD-10-CM | POA: Diagnosis not present

## 2013-08-14 DIAGNOSIS — G35 Multiple sclerosis: Secondary | ICD-10-CM | POA: Diagnosis not present

## 2013-08-14 DIAGNOSIS — IMO0002 Reserved for concepts with insufficient information to code with codable children: Secondary | ICD-10-CM | POA: Diagnosis not present

## 2013-08-15 DIAGNOSIS — L02619 Cutaneous abscess of unspecified foot: Secondary | ICD-10-CM | POA: Diagnosis not present

## 2013-08-16 DIAGNOSIS — I10 Essential (primary) hypertension: Secondary | ICD-10-CM | POA: Diagnosis not present

## 2013-08-16 DIAGNOSIS — L02619 Cutaneous abscess of unspecified foot: Secondary | ICD-10-CM | POA: Diagnosis not present

## 2013-08-16 DIAGNOSIS — IMO0002 Reserved for concepts with insufficient information to code with codable children: Secondary | ICD-10-CM | POA: Diagnosis not present

## 2013-08-16 DIAGNOSIS — M5430 Sciatica, unspecified side: Secondary | ICD-10-CM | POA: Diagnosis not present

## 2013-08-16 DIAGNOSIS — E119 Type 2 diabetes mellitus without complications: Secondary | ICD-10-CM | POA: Diagnosis not present

## 2013-08-16 DIAGNOSIS — G35 Multiple sclerosis: Secondary | ICD-10-CM | POA: Diagnosis not present

## 2013-08-17 DIAGNOSIS — L03119 Cellulitis of unspecified part of limb: Secondary | ICD-10-CM | POA: Diagnosis not present

## 2013-08-17 DIAGNOSIS — I10 Essential (primary) hypertension: Secondary | ICD-10-CM | POA: Diagnosis not present

## 2013-08-17 DIAGNOSIS — E119 Type 2 diabetes mellitus without complications: Secondary | ICD-10-CM | POA: Diagnosis not present

## 2013-08-17 DIAGNOSIS — L02619 Cutaneous abscess of unspecified foot: Secondary | ICD-10-CM | POA: Diagnosis not present

## 2013-08-17 DIAGNOSIS — IMO0002 Reserved for concepts with insufficient information to code with codable children: Secondary | ICD-10-CM | POA: Diagnosis not present

## 2013-08-17 DIAGNOSIS — M5430 Sciatica, unspecified side: Secondary | ICD-10-CM | POA: Diagnosis not present

## 2013-08-17 DIAGNOSIS — G35 Multiple sclerosis: Secondary | ICD-10-CM | POA: Diagnosis not present

## 2013-08-21 DIAGNOSIS — L03119 Cellulitis of unspecified part of limb: Secondary | ICD-10-CM | POA: Diagnosis not present

## 2013-08-21 DIAGNOSIS — I10 Essential (primary) hypertension: Secondary | ICD-10-CM | POA: Diagnosis not present

## 2013-08-21 DIAGNOSIS — IMO0002 Reserved for concepts with insufficient information to code with codable children: Secondary | ICD-10-CM | POA: Diagnosis not present

## 2013-08-21 DIAGNOSIS — G35 Multiple sclerosis: Secondary | ICD-10-CM | POA: Diagnosis not present

## 2013-08-21 DIAGNOSIS — M5430 Sciatica, unspecified side: Secondary | ICD-10-CM | POA: Diagnosis not present

## 2013-08-21 DIAGNOSIS — E119 Type 2 diabetes mellitus without complications: Secondary | ICD-10-CM | POA: Diagnosis not present

## 2013-08-21 DIAGNOSIS — L02619 Cutaneous abscess of unspecified foot: Secondary | ICD-10-CM | POA: Diagnosis not present

## 2013-08-22 DIAGNOSIS — L03119 Cellulitis of unspecified part of limb: Secondary | ICD-10-CM | POA: Diagnosis not present

## 2013-08-22 DIAGNOSIS — M5430 Sciatica, unspecified side: Secondary | ICD-10-CM | POA: Diagnosis not present

## 2013-08-22 DIAGNOSIS — L02619 Cutaneous abscess of unspecified foot: Secondary | ICD-10-CM | POA: Diagnosis not present

## 2013-08-22 DIAGNOSIS — G35 Multiple sclerosis: Secondary | ICD-10-CM | POA: Diagnosis not present

## 2013-08-22 DIAGNOSIS — IMO0002 Reserved for concepts with insufficient information to code with codable children: Secondary | ICD-10-CM | POA: Diagnosis not present

## 2013-08-22 DIAGNOSIS — I10 Essential (primary) hypertension: Secondary | ICD-10-CM | POA: Diagnosis not present

## 2013-08-22 DIAGNOSIS — E119 Type 2 diabetes mellitus without complications: Secondary | ICD-10-CM | POA: Diagnosis not present

## 2013-08-23 DIAGNOSIS — G35 Multiple sclerosis: Secondary | ICD-10-CM | POA: Diagnosis not present

## 2013-08-23 DIAGNOSIS — L89309 Pressure ulcer of unspecified buttock, unspecified stage: Secondary | ICD-10-CM | POA: Diagnosis not present

## 2013-08-23 DIAGNOSIS — I10 Essential (primary) hypertension: Secondary | ICD-10-CM | POA: Diagnosis not present

## 2013-08-23 DIAGNOSIS — L03119 Cellulitis of unspecified part of limb: Secondary | ICD-10-CM | POA: Diagnosis not present

## 2013-08-23 DIAGNOSIS — E119 Type 2 diabetes mellitus without complications: Secondary | ICD-10-CM | POA: Diagnosis not present

## 2013-08-23 DIAGNOSIS — IMO0002 Reserved for concepts with insufficient information to code with codable children: Secondary | ICD-10-CM | POA: Diagnosis not present

## 2013-08-23 DIAGNOSIS — M5430 Sciatica, unspecified side: Secondary | ICD-10-CM | POA: Diagnosis not present

## 2013-08-23 DIAGNOSIS — L02619 Cutaneous abscess of unspecified foot: Secondary | ICD-10-CM | POA: Diagnosis not present

## 2013-08-24 ENCOUNTER — Encounter (HOSPITAL_BASED_OUTPATIENT_CLINIC_OR_DEPARTMENT_OTHER): Payer: Medicare Other | Attending: Internal Medicine

## 2013-08-24 DIAGNOSIS — G35 Multiple sclerosis: Secondary | ICD-10-CM | POA: Insufficient documentation

## 2013-08-24 DIAGNOSIS — Z79899 Other long term (current) drug therapy: Secondary | ICD-10-CM | POA: Insufficient documentation

## 2013-08-24 DIAGNOSIS — N189 Chronic kidney disease, unspecified: Secondary | ICD-10-CM | POA: Diagnosis not present

## 2013-08-24 DIAGNOSIS — M5430 Sciatica, unspecified side: Secondary | ICD-10-CM | POA: Diagnosis not present

## 2013-08-24 DIAGNOSIS — E119 Type 2 diabetes mellitus without complications: Secondary | ICD-10-CM | POA: Insufficient documentation

## 2013-08-24 DIAGNOSIS — L8993 Pressure ulcer of unspecified site, stage 3: Secondary | ICD-10-CM | POA: Insufficient documentation

## 2013-08-24 DIAGNOSIS — L89609 Pressure ulcer of unspecified heel, unspecified stage: Secondary | ICD-10-CM | POA: Insufficient documentation

## 2013-08-24 DIAGNOSIS — IMO0002 Reserved for concepts with insufficient information to code with codable children: Secondary | ICD-10-CM | POA: Diagnosis not present

## 2013-08-24 DIAGNOSIS — D509 Iron deficiency anemia, unspecified: Secondary | ICD-10-CM | POA: Insufficient documentation

## 2013-08-24 DIAGNOSIS — I129 Hypertensive chronic kidney disease with stage 1 through stage 4 chronic kidney disease, or unspecified chronic kidney disease: Secondary | ICD-10-CM | POA: Insufficient documentation

## 2013-08-24 DIAGNOSIS — I10 Essential (primary) hypertension: Secondary | ICD-10-CM | POA: Diagnosis not present

## 2013-08-24 DIAGNOSIS — Z7982 Long term (current) use of aspirin: Secondary | ICD-10-CM | POA: Diagnosis not present

## 2013-08-24 DIAGNOSIS — L89309 Pressure ulcer of unspecified buttock, unspecified stage: Secondary | ICD-10-CM | POA: Insufficient documentation

## 2013-08-24 DIAGNOSIS — L02619 Cutaneous abscess of unspecified foot: Secondary | ICD-10-CM | POA: Diagnosis not present

## 2013-08-24 DIAGNOSIS — L8992 Pressure ulcer of unspecified site, stage 2: Secondary | ICD-10-CM | POA: Diagnosis not present

## 2013-08-24 DIAGNOSIS — L03119 Cellulitis of unspecified part of limb: Secondary | ICD-10-CM | POA: Diagnosis not present

## 2013-08-25 DIAGNOSIS — M5430 Sciatica, unspecified side: Secondary | ICD-10-CM | POA: Diagnosis not present

## 2013-08-25 DIAGNOSIS — L03119 Cellulitis of unspecified part of limb: Secondary | ICD-10-CM | POA: Diagnosis not present

## 2013-08-25 DIAGNOSIS — E119 Type 2 diabetes mellitus without complications: Secondary | ICD-10-CM | POA: Diagnosis not present

## 2013-08-25 DIAGNOSIS — L02619 Cutaneous abscess of unspecified foot: Secondary | ICD-10-CM | POA: Diagnosis not present

## 2013-08-25 DIAGNOSIS — I10 Essential (primary) hypertension: Secondary | ICD-10-CM | POA: Diagnosis not present

## 2013-08-25 DIAGNOSIS — IMO0002 Reserved for concepts with insufficient information to code with codable children: Secondary | ICD-10-CM | POA: Diagnosis not present

## 2013-08-25 DIAGNOSIS — G35 Multiple sclerosis: Secondary | ICD-10-CM | POA: Diagnosis not present

## 2013-08-25 NOTE — Progress Notes (Signed)
[facility]  NAME:  DYQUAN, KRIMMER NO.:  [id number]  MEDICAL RECORD NO.:  [id number]      DATE OF BIRTH:  07-18-1940  PHYSICIAN:  Ricard Dillon, M.D. VISIT DATE:  08/24/2013                                  OFFICE VISIT   FACILITY:  [facility].  Mr. Jeffres comes for our review of three different wound areas courtesy of his primary physician, Dr. Inda Merlin.  The history provided by the patient, which seems to be verified by review of [facility] is that he developed an area on his right foot, which was a large blister before hospitalization on August 02, 2013. This was apparently aspirated and cultured, although I do not see those results on [facility].  He was hospitalized for cellulitis of the right foot.  He was treated with aggressive antibiotic therapy including vancomycin.  The culture apparently ultimately grew Serratia. Therefore, he was transition to Rocephin and then oral Bactrim.  The erythema and swelling resolved on the dorsum of his right foot and the blistering seems to have ultimately fully epithelialized.  He was discharged to home with Home Health.  He tells me that roughly a week ago, he developed an area on his left posteromedial leg waking up with fluid on his bed and then roughly a week ago as well, he noted area on his left buttock.  PAST MEDICAL HISTORY: 1. Multiple sclerosis.  The patient states since his hospitalization     from August 02, 2013 to August 06, 2013, he is much weaker.  He is     normally able to transfer himself, although I think he is     struggling with this. 2. Type 2 diabetes, on oral agents. 3. Chronic renal failure, stage III. 4. Essential hypertension. 5. Iron deficiency anemia.  CURRENT MEDICATIONS: 1. Aspirin 81 daily. 2. Ferrous sulfate 325 daily. 3. Fish oil 1000 daily. 4. Glimepiride 4 mg daily. 5. Lisinopril/hydrochlorothiazide 20/25 one daily. 6.  Oxybutynin 5 daily. 7. Simvastatin 20 mg daily. 8. Bactrim DS b.i.d. 9. Vitamin D3.  PHYSICAL EXAMINATION:  His temperature is 98.2, pulse 85, respirations 16, blood pressure 132/72.  Vascular in the left foot, his ABI is 1.17. Dorsalis pedis pulse is easily palpable.  Capillary refill time seems intact.  The area on his left heel is on the medial aspect of the heel inferior to the medial malleolus.  The area is a clean wound with healthy granulation; however, it would appear that there is a circumferential area of pressure surrounding this aspect of his heel and I am somewhat concerned about some discoloration and tenderness at the border where I presumed this pressure is suggesting the possibility of deep tinged tissue injury and/or possibility of infection in this area.  The right heel was the original blister that prompted the cellulitis on hospitalization is fully epithelialized and I really do not see a major issue here at the moment.  The other difficult area is on the patient's left buttock over the left ischial tuberosity.  This was roughly 2.8 x 2.8; however, had an area of necrosis in the center of this, underwent a surgical debridement with scalpel and pickups.  Unfortunately, this opens into a stage III wound with some degree of undermining.  A deep culture of the recess of this was done.  IMPRESSION/PLAN: 1. Area on the right heel, which was the original wound that prompted     the hospitalization.  I really do not think this is going to be a     problem, it is largely epithelialized and healed other than     protecting this with a foam heel cup, I think this is the least of     this man's worries. 2. Right medial foot.  Again, I think this is a pressure area.  The     wound itself appears to be healthy.  We applied silver collagen,     Hydrogel and a foam covered to this with a Kerlix wrap.  3. The left buttock which is at this point a stage III pressure area.   This required debridement, although the overall surface area of the wound is not large, this probes deeply and has considerable undermining.  A culture of this area was done.  I did not add antibiotic therapy at the present time for this area, although I did put him on doxycycline for concern of the left heel.  We are going to dress this with silver alginate packing, covered with silver alginate and Allevyn-based dressing.  With regards to all of these wounds on the left foot and left buttock, I had an extensive discussion with the patient and his wife about offloading here.  This is going to be very difficult to care for in the home.  I have suggested a hospital bed and a pressure reducing mattress and we will try to initiate this through DME providers into his home. We have called in these orders to [facility], which I would like to change the dressing twice before coming back here on Thursday.  I did prescribe doxycycline for the area inferior to the wound on the left medial heel.  A culture of the buttock wound is pending.  Finally, I did have a discussion about perhaps transitioning this patient in to an SNF for wound care, although after extensive discussion with the patient and his wife, we have agreed to attempt to do this within his home.  Pressure relief especially on the buttock and the left heel was paramount here, we discussed this in detail.  Follow up here will be in 1 week.          ______________________________ Ricard Dillon, M.D.     MGR/MEDQ  D:  08/24/2013  T:  08/25/2013  Job:  [id number]

## 2013-08-26 DIAGNOSIS — M5430 Sciatica, unspecified side: Secondary | ICD-10-CM | POA: Diagnosis not present

## 2013-08-26 DIAGNOSIS — G35 Multiple sclerosis: Secondary | ICD-10-CM | POA: Diagnosis not present

## 2013-08-26 DIAGNOSIS — E119 Type 2 diabetes mellitus without complications: Secondary | ICD-10-CM | POA: Diagnosis not present

## 2013-08-26 DIAGNOSIS — I10 Essential (primary) hypertension: Secondary | ICD-10-CM | POA: Diagnosis not present

## 2013-08-26 DIAGNOSIS — IMO0002 Reserved for concepts with insufficient information to code with codable children: Secondary | ICD-10-CM | POA: Diagnosis not present

## 2013-08-26 DIAGNOSIS — L02619 Cutaneous abscess of unspecified foot: Secondary | ICD-10-CM | POA: Diagnosis not present

## 2013-08-28 DIAGNOSIS — M5430 Sciatica, unspecified side: Secondary | ICD-10-CM | POA: Diagnosis not present

## 2013-08-28 DIAGNOSIS — I10 Essential (primary) hypertension: Secondary | ICD-10-CM | POA: Diagnosis not present

## 2013-08-28 DIAGNOSIS — L02619 Cutaneous abscess of unspecified foot: Secondary | ICD-10-CM | POA: Diagnosis not present

## 2013-08-28 DIAGNOSIS — IMO0002 Reserved for concepts with insufficient information to code with codable children: Secondary | ICD-10-CM | POA: Diagnosis not present

## 2013-08-28 DIAGNOSIS — E119 Type 2 diabetes mellitus without complications: Secondary | ICD-10-CM | POA: Diagnosis not present

## 2013-08-28 DIAGNOSIS — G35 Multiple sclerosis: Secondary | ICD-10-CM | POA: Diagnosis not present

## 2013-08-29 DIAGNOSIS — IMO0002 Reserved for concepts with insufficient information to code with codable children: Secondary | ICD-10-CM | POA: Diagnosis not present

## 2013-08-29 DIAGNOSIS — G35 Multiple sclerosis: Secondary | ICD-10-CM | POA: Diagnosis not present

## 2013-08-29 DIAGNOSIS — I10 Essential (primary) hypertension: Secondary | ICD-10-CM | POA: Diagnosis not present

## 2013-08-29 DIAGNOSIS — M5430 Sciatica, unspecified side: Secondary | ICD-10-CM | POA: Diagnosis not present

## 2013-08-29 DIAGNOSIS — E119 Type 2 diabetes mellitus without complications: Secondary | ICD-10-CM | POA: Diagnosis not present

## 2013-08-29 DIAGNOSIS — L02619 Cutaneous abscess of unspecified foot: Secondary | ICD-10-CM | POA: Diagnosis not present

## 2013-08-31 DIAGNOSIS — L89309 Pressure ulcer of unspecified buttock, unspecified stage: Secondary | ICD-10-CM | POA: Diagnosis not present

## 2013-08-31 DIAGNOSIS — L8993 Pressure ulcer of unspecified site, stage 3: Secondary | ICD-10-CM | POA: Diagnosis not present

## 2013-08-31 DIAGNOSIS — L89609 Pressure ulcer of unspecified heel, unspecified stage: Secondary | ICD-10-CM | POA: Diagnosis not present

## 2013-08-31 DIAGNOSIS — L8992 Pressure ulcer of unspecified site, stage 2: Secondary | ICD-10-CM | POA: Diagnosis not present

## 2013-09-01 DIAGNOSIS — L03119 Cellulitis of unspecified part of limb: Secondary | ICD-10-CM | POA: Diagnosis not present

## 2013-09-01 DIAGNOSIS — E119 Type 2 diabetes mellitus without complications: Secondary | ICD-10-CM | POA: Diagnosis not present

## 2013-09-01 DIAGNOSIS — M5430 Sciatica, unspecified side: Secondary | ICD-10-CM | POA: Diagnosis not present

## 2013-09-01 DIAGNOSIS — G35 Multiple sclerosis: Secondary | ICD-10-CM | POA: Diagnosis not present

## 2013-09-01 DIAGNOSIS — L02619 Cutaneous abscess of unspecified foot: Secondary | ICD-10-CM | POA: Diagnosis not present

## 2013-09-01 DIAGNOSIS — IMO0002 Reserved for concepts with insufficient information to code with codable children: Secondary | ICD-10-CM | POA: Diagnosis not present

## 2013-09-01 DIAGNOSIS — I10 Essential (primary) hypertension: Secondary | ICD-10-CM | POA: Diagnosis not present

## 2013-09-02 DIAGNOSIS — M5430 Sciatica, unspecified side: Secondary | ICD-10-CM | POA: Diagnosis not present

## 2013-09-02 DIAGNOSIS — G35 Multiple sclerosis: Secondary | ICD-10-CM | POA: Diagnosis not present

## 2013-09-02 DIAGNOSIS — L02619 Cutaneous abscess of unspecified foot: Secondary | ICD-10-CM | POA: Diagnosis not present

## 2013-09-02 DIAGNOSIS — I10 Essential (primary) hypertension: Secondary | ICD-10-CM | POA: Diagnosis not present

## 2013-09-02 DIAGNOSIS — E119 Type 2 diabetes mellitus without complications: Secondary | ICD-10-CM | POA: Diagnosis not present

## 2013-09-02 DIAGNOSIS — IMO0002 Reserved for concepts with insufficient information to code with codable children: Secondary | ICD-10-CM | POA: Diagnosis not present

## 2013-09-04 DIAGNOSIS — G35 Multiple sclerosis: Secondary | ICD-10-CM | POA: Diagnosis not present

## 2013-09-04 DIAGNOSIS — E119 Type 2 diabetes mellitus without complications: Secondary | ICD-10-CM | POA: Diagnosis not present

## 2013-09-04 DIAGNOSIS — IMO0002 Reserved for concepts with insufficient information to code with codable children: Secondary | ICD-10-CM | POA: Diagnosis not present

## 2013-09-04 DIAGNOSIS — L02619 Cutaneous abscess of unspecified foot: Secondary | ICD-10-CM | POA: Diagnosis not present

## 2013-09-04 DIAGNOSIS — I10 Essential (primary) hypertension: Secondary | ICD-10-CM | POA: Diagnosis not present

## 2013-09-04 DIAGNOSIS — M5430 Sciatica, unspecified side: Secondary | ICD-10-CM | POA: Diagnosis not present

## 2013-09-05 DIAGNOSIS — L02619 Cutaneous abscess of unspecified foot: Secondary | ICD-10-CM | POA: Diagnosis not present

## 2013-09-05 DIAGNOSIS — N183 Chronic kidney disease, stage 3 unspecified: Secondary | ICD-10-CM | POA: Diagnosis not present

## 2013-09-05 DIAGNOSIS — L8990 Pressure ulcer of unspecified site, unspecified stage: Secondary | ICD-10-CM | POA: Diagnosis not present

## 2013-09-05 DIAGNOSIS — I10 Essential (primary) hypertension: Secondary | ICD-10-CM | POA: Diagnosis not present

## 2013-09-05 DIAGNOSIS — E119 Type 2 diabetes mellitus without complications: Secondary | ICD-10-CM | POA: Diagnosis not present

## 2013-09-05 DIAGNOSIS — IMO0002 Reserved for concepts with insufficient information to code with codable children: Secondary | ICD-10-CM | POA: Diagnosis not present

## 2013-09-05 DIAGNOSIS — G35 Multiple sclerosis: Secondary | ICD-10-CM | POA: Diagnosis not present

## 2013-09-05 DIAGNOSIS — M5430 Sciatica, unspecified side: Secondary | ICD-10-CM | POA: Diagnosis not present

## 2013-09-07 ENCOUNTER — Encounter (HOSPITAL_BASED_OUTPATIENT_CLINIC_OR_DEPARTMENT_OTHER): Payer: Medicare Other | Attending: Internal Medicine

## 2013-09-07 DIAGNOSIS — L89609 Pressure ulcer of unspecified heel, unspecified stage: Secondary | ICD-10-CM | POA: Insufficient documentation

## 2013-09-07 DIAGNOSIS — L8992 Pressure ulcer of unspecified site, stage 2: Secondary | ICD-10-CM | POA: Insufficient documentation

## 2013-09-07 DIAGNOSIS — L89309 Pressure ulcer of unspecified buttock, unspecified stage: Secondary | ICD-10-CM | POA: Insufficient documentation

## 2013-09-07 DIAGNOSIS — L8993 Pressure ulcer of unspecified site, stage 3: Secondary | ICD-10-CM | POA: Insufficient documentation

## 2013-09-08 DIAGNOSIS — E119 Type 2 diabetes mellitus without complications: Secondary | ICD-10-CM | POA: Diagnosis not present

## 2013-09-08 DIAGNOSIS — I10 Essential (primary) hypertension: Secondary | ICD-10-CM | POA: Diagnosis not present

## 2013-09-08 DIAGNOSIS — L02619 Cutaneous abscess of unspecified foot: Secondary | ICD-10-CM | POA: Diagnosis not present

## 2013-09-08 DIAGNOSIS — G35 Multiple sclerosis: Secondary | ICD-10-CM | POA: Diagnosis not present

## 2013-09-08 DIAGNOSIS — IMO0002 Reserved for concepts with insufficient information to code with codable children: Secondary | ICD-10-CM | POA: Diagnosis not present

## 2013-09-08 DIAGNOSIS — L03119 Cellulitis of unspecified part of limb: Secondary | ICD-10-CM | POA: Diagnosis not present

## 2013-09-08 DIAGNOSIS — M5430 Sciatica, unspecified side: Secondary | ICD-10-CM | POA: Diagnosis not present

## 2013-09-11 DIAGNOSIS — M5430 Sciatica, unspecified side: Secondary | ICD-10-CM | POA: Diagnosis not present

## 2013-09-11 DIAGNOSIS — IMO0002 Reserved for concepts with insufficient information to code with codable children: Secondary | ICD-10-CM | POA: Diagnosis not present

## 2013-09-11 DIAGNOSIS — E119 Type 2 diabetes mellitus without complications: Secondary | ICD-10-CM | POA: Diagnosis not present

## 2013-09-11 DIAGNOSIS — L02619 Cutaneous abscess of unspecified foot: Secondary | ICD-10-CM | POA: Diagnosis not present

## 2013-09-11 DIAGNOSIS — I10 Essential (primary) hypertension: Secondary | ICD-10-CM | POA: Diagnosis not present

## 2013-09-11 DIAGNOSIS — G35 Multiple sclerosis: Secondary | ICD-10-CM | POA: Diagnosis not present

## 2013-09-12 DIAGNOSIS — L02619 Cutaneous abscess of unspecified foot: Secondary | ICD-10-CM | POA: Diagnosis not present

## 2013-09-12 DIAGNOSIS — IMO0002 Reserved for concepts with insufficient information to code with codable children: Secondary | ICD-10-CM | POA: Diagnosis not present

## 2013-09-12 DIAGNOSIS — I10 Essential (primary) hypertension: Secondary | ICD-10-CM | POA: Diagnosis not present

## 2013-09-12 DIAGNOSIS — M5430 Sciatica, unspecified side: Secondary | ICD-10-CM | POA: Diagnosis not present

## 2013-09-12 DIAGNOSIS — E119 Type 2 diabetes mellitus without complications: Secondary | ICD-10-CM | POA: Diagnosis not present

## 2013-09-12 DIAGNOSIS — G35 Multiple sclerosis: Secondary | ICD-10-CM | POA: Diagnosis not present

## 2013-09-14 DIAGNOSIS — L89309 Pressure ulcer of unspecified buttock, unspecified stage: Secondary | ICD-10-CM | POA: Diagnosis not present

## 2013-09-14 DIAGNOSIS — L8992 Pressure ulcer of unspecified site, stage 2: Secondary | ICD-10-CM | POA: Diagnosis not present

## 2013-09-14 DIAGNOSIS — L8993 Pressure ulcer of unspecified site, stage 3: Secondary | ICD-10-CM | POA: Diagnosis not present

## 2013-09-14 DIAGNOSIS — L89609 Pressure ulcer of unspecified heel, unspecified stage: Secondary | ICD-10-CM | POA: Diagnosis not present

## 2013-09-15 DIAGNOSIS — IMO0002 Reserved for concepts with insufficient information to code with codable children: Secondary | ICD-10-CM | POA: Diagnosis not present

## 2013-09-15 DIAGNOSIS — E119 Type 2 diabetes mellitus without complications: Secondary | ICD-10-CM | POA: Diagnosis not present

## 2013-09-15 DIAGNOSIS — M5430 Sciatica, unspecified side: Secondary | ICD-10-CM | POA: Diagnosis not present

## 2013-09-15 DIAGNOSIS — L03119 Cellulitis of unspecified part of limb: Secondary | ICD-10-CM | POA: Diagnosis not present

## 2013-09-15 DIAGNOSIS — I10 Essential (primary) hypertension: Secondary | ICD-10-CM | POA: Diagnosis not present

## 2013-09-15 DIAGNOSIS — L02619 Cutaneous abscess of unspecified foot: Secondary | ICD-10-CM | POA: Diagnosis not present

## 2013-09-15 DIAGNOSIS — G35 Multiple sclerosis: Secondary | ICD-10-CM | POA: Diagnosis not present

## 2013-09-18 DIAGNOSIS — E119 Type 2 diabetes mellitus without complications: Secondary | ICD-10-CM | POA: Diagnosis not present

## 2013-09-18 DIAGNOSIS — L02619 Cutaneous abscess of unspecified foot: Secondary | ICD-10-CM | POA: Diagnosis not present

## 2013-09-18 DIAGNOSIS — M5430 Sciatica, unspecified side: Secondary | ICD-10-CM | POA: Diagnosis not present

## 2013-09-18 DIAGNOSIS — IMO0002 Reserved for concepts with insufficient information to code with codable children: Secondary | ICD-10-CM | POA: Diagnosis not present

## 2013-09-18 DIAGNOSIS — G35 Multiple sclerosis: Secondary | ICD-10-CM | POA: Diagnosis not present

## 2013-09-18 DIAGNOSIS — L03119 Cellulitis of unspecified part of limb: Secondary | ICD-10-CM | POA: Diagnosis not present

## 2013-09-18 DIAGNOSIS — I10 Essential (primary) hypertension: Secondary | ICD-10-CM | POA: Diagnosis not present

## 2013-09-21 DIAGNOSIS — G35 Multiple sclerosis: Secondary | ICD-10-CM | POA: Diagnosis not present

## 2013-09-21 DIAGNOSIS — L8993 Pressure ulcer of unspecified site, stage 3: Secondary | ICD-10-CM | POA: Diagnosis not present

## 2013-09-21 DIAGNOSIS — M5430 Sciatica, unspecified side: Secondary | ICD-10-CM | POA: Diagnosis not present

## 2013-09-21 DIAGNOSIS — L8992 Pressure ulcer of unspecified site, stage 2: Secondary | ICD-10-CM | POA: Diagnosis not present

## 2013-09-21 DIAGNOSIS — L02619 Cutaneous abscess of unspecified foot: Secondary | ICD-10-CM | POA: Diagnosis not present

## 2013-09-21 DIAGNOSIS — I10 Essential (primary) hypertension: Secondary | ICD-10-CM | POA: Diagnosis not present

## 2013-09-21 DIAGNOSIS — L89609 Pressure ulcer of unspecified heel, unspecified stage: Secondary | ICD-10-CM | POA: Diagnosis not present

## 2013-09-21 DIAGNOSIS — E119 Type 2 diabetes mellitus without complications: Secondary | ICD-10-CM | POA: Diagnosis not present

## 2013-09-21 DIAGNOSIS — IMO0002 Reserved for concepts with insufficient information to code with codable children: Secondary | ICD-10-CM | POA: Diagnosis not present

## 2013-09-21 DIAGNOSIS — L89309 Pressure ulcer of unspecified buttock, unspecified stage: Secondary | ICD-10-CM | POA: Diagnosis not present

## 2013-09-21 DIAGNOSIS — L03119 Cellulitis of unspecified part of limb: Secondary | ICD-10-CM | POA: Diagnosis not present

## 2013-09-22 DIAGNOSIS — IMO0002 Reserved for concepts with insufficient information to code with codable children: Secondary | ICD-10-CM | POA: Diagnosis not present

## 2013-09-22 DIAGNOSIS — E119 Type 2 diabetes mellitus without complications: Secondary | ICD-10-CM | POA: Diagnosis not present

## 2013-09-22 DIAGNOSIS — I10 Essential (primary) hypertension: Secondary | ICD-10-CM | POA: Diagnosis not present

## 2013-09-22 DIAGNOSIS — L02619 Cutaneous abscess of unspecified foot: Secondary | ICD-10-CM | POA: Diagnosis not present

## 2013-09-22 DIAGNOSIS — M5430 Sciatica, unspecified side: Secondary | ICD-10-CM | POA: Diagnosis not present

## 2013-09-22 DIAGNOSIS — G35 Multiple sclerosis: Secondary | ICD-10-CM | POA: Diagnosis not present

## 2013-09-22 DIAGNOSIS — L03119 Cellulitis of unspecified part of limb: Secondary | ICD-10-CM | POA: Diagnosis not present

## 2013-09-26 DIAGNOSIS — E119 Type 2 diabetes mellitus without complications: Secondary | ICD-10-CM | POA: Diagnosis not present

## 2013-09-26 DIAGNOSIS — L02619 Cutaneous abscess of unspecified foot: Secondary | ICD-10-CM | POA: Diagnosis not present

## 2013-09-26 DIAGNOSIS — IMO0002 Reserved for concepts with insufficient information to code with codable children: Secondary | ICD-10-CM | POA: Diagnosis not present

## 2013-09-26 DIAGNOSIS — G35 Multiple sclerosis: Secondary | ICD-10-CM | POA: Diagnosis not present

## 2013-09-26 DIAGNOSIS — L03119 Cellulitis of unspecified part of limb: Secondary | ICD-10-CM | POA: Diagnosis not present

## 2013-09-26 DIAGNOSIS — M5430 Sciatica, unspecified side: Secondary | ICD-10-CM | POA: Diagnosis not present

## 2013-09-26 DIAGNOSIS — I10 Essential (primary) hypertension: Secondary | ICD-10-CM | POA: Diagnosis not present

## 2013-09-27 DIAGNOSIS — I10 Essential (primary) hypertension: Secondary | ICD-10-CM | POA: Diagnosis not present

## 2013-09-27 DIAGNOSIS — M5430 Sciatica, unspecified side: Secondary | ICD-10-CM | POA: Diagnosis not present

## 2013-09-27 DIAGNOSIS — L02619 Cutaneous abscess of unspecified foot: Secondary | ICD-10-CM | POA: Diagnosis not present

## 2013-09-27 DIAGNOSIS — E119 Type 2 diabetes mellitus without complications: Secondary | ICD-10-CM | POA: Diagnosis not present

## 2013-09-27 DIAGNOSIS — IMO0002 Reserved for concepts with insufficient information to code with codable children: Secondary | ICD-10-CM | POA: Diagnosis not present

## 2013-09-27 DIAGNOSIS — G35 Multiple sclerosis: Secondary | ICD-10-CM | POA: Diagnosis not present

## 2013-09-28 DIAGNOSIS — L8992 Pressure ulcer of unspecified site, stage 2: Secondary | ICD-10-CM | POA: Diagnosis not present

## 2013-09-28 DIAGNOSIS — L89609 Pressure ulcer of unspecified heel, unspecified stage: Secondary | ICD-10-CM | POA: Diagnosis not present

## 2013-09-28 DIAGNOSIS — L8993 Pressure ulcer of unspecified site, stage 3: Secondary | ICD-10-CM | POA: Diagnosis not present

## 2013-09-28 DIAGNOSIS — L89309 Pressure ulcer of unspecified buttock, unspecified stage: Secondary | ICD-10-CM | POA: Diagnosis not present

## 2013-09-29 DIAGNOSIS — L03119 Cellulitis of unspecified part of limb: Secondary | ICD-10-CM | POA: Diagnosis not present

## 2013-09-29 DIAGNOSIS — IMO0002 Reserved for concepts with insufficient information to code with codable children: Secondary | ICD-10-CM | POA: Diagnosis not present

## 2013-09-29 DIAGNOSIS — E119 Type 2 diabetes mellitus without complications: Secondary | ICD-10-CM | POA: Diagnosis not present

## 2013-09-29 DIAGNOSIS — L02619 Cutaneous abscess of unspecified foot: Secondary | ICD-10-CM | POA: Diagnosis not present

## 2013-09-29 DIAGNOSIS — I10 Essential (primary) hypertension: Secondary | ICD-10-CM | POA: Diagnosis not present

## 2013-09-29 DIAGNOSIS — M5430 Sciatica, unspecified side: Secondary | ICD-10-CM | POA: Diagnosis not present

## 2013-09-29 DIAGNOSIS — G35 Multiple sclerosis: Secondary | ICD-10-CM | POA: Diagnosis not present

## 2013-09-30 DIAGNOSIS — I10 Essential (primary) hypertension: Secondary | ICD-10-CM | POA: Diagnosis not present

## 2013-09-30 DIAGNOSIS — L03119 Cellulitis of unspecified part of limb: Secondary | ICD-10-CM | POA: Diagnosis not present

## 2013-09-30 DIAGNOSIS — E119 Type 2 diabetes mellitus without complications: Secondary | ICD-10-CM | POA: Diagnosis not present

## 2013-09-30 DIAGNOSIS — L02619 Cutaneous abscess of unspecified foot: Secondary | ICD-10-CM | POA: Diagnosis not present

## 2013-09-30 DIAGNOSIS — IMO0002 Reserved for concepts with insufficient information to code with codable children: Secondary | ICD-10-CM | POA: Diagnosis not present

## 2013-09-30 DIAGNOSIS — G35 Multiple sclerosis: Secondary | ICD-10-CM | POA: Diagnosis not present

## 2013-09-30 DIAGNOSIS — M5430 Sciatica, unspecified side: Secondary | ICD-10-CM | POA: Diagnosis not present

## 2013-10-02 DIAGNOSIS — E119 Type 2 diabetes mellitus without complications: Secondary | ICD-10-CM | POA: Diagnosis not present

## 2013-10-02 DIAGNOSIS — I10 Essential (primary) hypertension: Secondary | ICD-10-CM | POA: Diagnosis not present

## 2013-10-02 DIAGNOSIS — G35 Multiple sclerosis: Secondary | ICD-10-CM | POA: Diagnosis not present

## 2013-10-02 DIAGNOSIS — IMO0002 Reserved for concepts with insufficient information to code with codable children: Secondary | ICD-10-CM | POA: Diagnosis not present

## 2013-10-02 DIAGNOSIS — M5430 Sciatica, unspecified side: Secondary | ICD-10-CM | POA: Diagnosis not present

## 2013-10-02 DIAGNOSIS — L02619 Cutaneous abscess of unspecified foot: Secondary | ICD-10-CM | POA: Diagnosis not present

## 2013-10-03 DIAGNOSIS — G35 Multiple sclerosis: Secondary | ICD-10-CM | POA: Diagnosis not present

## 2013-10-03 DIAGNOSIS — IMO0002 Reserved for concepts with insufficient information to code with codable children: Secondary | ICD-10-CM | POA: Diagnosis not present

## 2013-10-03 DIAGNOSIS — M5430 Sciatica, unspecified side: Secondary | ICD-10-CM | POA: Diagnosis not present

## 2013-10-03 DIAGNOSIS — L02619 Cutaneous abscess of unspecified foot: Secondary | ICD-10-CM | POA: Diagnosis not present

## 2013-10-03 DIAGNOSIS — I10 Essential (primary) hypertension: Secondary | ICD-10-CM | POA: Diagnosis not present

## 2013-10-03 DIAGNOSIS — E119 Type 2 diabetes mellitus without complications: Secondary | ICD-10-CM | POA: Diagnosis not present

## 2013-10-05 ENCOUNTER — Encounter (HOSPITAL_BASED_OUTPATIENT_CLINIC_OR_DEPARTMENT_OTHER): Payer: Medicare Other | Attending: Internal Medicine

## 2013-10-05 DIAGNOSIS — L89309 Pressure ulcer of unspecified buttock, unspecified stage: Secondary | ICD-10-CM | POA: Insufficient documentation

## 2013-10-05 DIAGNOSIS — L89109 Pressure ulcer of unspecified part of back, unspecified stage: Secondary | ICD-10-CM | POA: Diagnosis not present

## 2013-10-05 DIAGNOSIS — L89609 Pressure ulcer of unspecified heel, unspecified stage: Secondary | ICD-10-CM | POA: Diagnosis not present

## 2013-10-05 DIAGNOSIS — L8993 Pressure ulcer of unspecified site, stage 3: Secondary | ICD-10-CM | POA: Diagnosis not present

## 2013-10-05 DIAGNOSIS — L89209 Pressure ulcer of unspecified hip, unspecified stage: Secondary | ICD-10-CM | POA: Insufficient documentation

## 2013-10-05 DIAGNOSIS — L8992 Pressure ulcer of unspecified site, stage 2: Secondary | ICD-10-CM | POA: Insufficient documentation

## 2013-10-05 DIAGNOSIS — S71009A Unspecified open wound, unspecified hip, initial encounter: Secondary | ICD-10-CM | POA: Diagnosis not present

## 2013-10-06 DIAGNOSIS — G35 Multiple sclerosis: Secondary | ICD-10-CM | POA: Diagnosis not present

## 2013-10-06 DIAGNOSIS — M5430 Sciatica, unspecified side: Secondary | ICD-10-CM | POA: Diagnosis not present

## 2013-10-06 DIAGNOSIS — L89209 Pressure ulcer of unspecified hip, unspecified stage: Secondary | ICD-10-CM | POA: Diagnosis not present

## 2013-10-06 DIAGNOSIS — I10 Essential (primary) hypertension: Secondary | ICD-10-CM | POA: Diagnosis not present

## 2013-10-06 DIAGNOSIS — E119 Type 2 diabetes mellitus without complications: Secondary | ICD-10-CM | POA: Diagnosis not present

## 2013-10-06 DIAGNOSIS — L89309 Pressure ulcer of unspecified buttock, unspecified stage: Secondary | ICD-10-CM | POA: Diagnosis not present

## 2013-10-06 DIAGNOSIS — L8993 Pressure ulcer of unspecified site, stage 3: Secondary | ICD-10-CM | POA: Diagnosis not present

## 2013-10-08 DIAGNOSIS — G35 Multiple sclerosis: Secondary | ICD-10-CM | POA: Diagnosis not present

## 2013-10-08 DIAGNOSIS — M5430 Sciatica, unspecified side: Secondary | ICD-10-CM | POA: Diagnosis not present

## 2013-10-08 DIAGNOSIS — L89309 Pressure ulcer of unspecified buttock, unspecified stage: Secondary | ICD-10-CM | POA: Diagnosis not present

## 2013-10-08 DIAGNOSIS — L89209 Pressure ulcer of unspecified hip, unspecified stage: Secondary | ICD-10-CM | POA: Diagnosis not present

## 2013-10-08 DIAGNOSIS — L8993 Pressure ulcer of unspecified site, stage 3: Secondary | ICD-10-CM | POA: Diagnosis not present

## 2013-10-08 DIAGNOSIS — E119 Type 2 diabetes mellitus without complications: Secondary | ICD-10-CM | POA: Diagnosis not present

## 2013-10-10 DIAGNOSIS — G35 Multiple sclerosis: Secondary | ICD-10-CM | POA: Diagnosis not present

## 2013-10-10 DIAGNOSIS — L8993 Pressure ulcer of unspecified site, stage 3: Secondary | ICD-10-CM | POA: Diagnosis not present

## 2013-10-10 DIAGNOSIS — L89309 Pressure ulcer of unspecified buttock, unspecified stage: Secondary | ICD-10-CM | POA: Diagnosis not present

## 2013-10-10 DIAGNOSIS — M5430 Sciatica, unspecified side: Secondary | ICD-10-CM | POA: Diagnosis not present

## 2013-10-10 DIAGNOSIS — L89209 Pressure ulcer of unspecified hip, unspecified stage: Secondary | ICD-10-CM | POA: Diagnosis not present

## 2013-10-10 DIAGNOSIS — E119 Type 2 diabetes mellitus without complications: Secondary | ICD-10-CM | POA: Diagnosis not present

## 2013-10-11 DIAGNOSIS — L89209 Pressure ulcer of unspecified hip, unspecified stage: Secondary | ICD-10-CM | POA: Diagnosis not present

## 2013-10-11 DIAGNOSIS — M5430 Sciatica, unspecified side: Secondary | ICD-10-CM | POA: Diagnosis not present

## 2013-10-11 DIAGNOSIS — L8993 Pressure ulcer of unspecified site, stage 3: Secondary | ICD-10-CM | POA: Diagnosis not present

## 2013-10-11 DIAGNOSIS — E119 Type 2 diabetes mellitus without complications: Secondary | ICD-10-CM | POA: Diagnosis not present

## 2013-10-11 DIAGNOSIS — G35 Multiple sclerosis: Secondary | ICD-10-CM | POA: Diagnosis not present

## 2013-10-11 DIAGNOSIS — L89309 Pressure ulcer of unspecified buttock, unspecified stage: Secondary | ICD-10-CM | POA: Diagnosis not present

## 2013-10-12 DIAGNOSIS — L89209 Pressure ulcer of unspecified hip, unspecified stage: Secondary | ICD-10-CM | POA: Diagnosis not present

## 2013-10-12 DIAGNOSIS — L89109 Pressure ulcer of unspecified part of back, unspecified stage: Secondary | ICD-10-CM | POA: Diagnosis not present

## 2013-10-12 DIAGNOSIS — L89609 Pressure ulcer of unspecified heel, unspecified stage: Secondary | ICD-10-CM | POA: Diagnosis not present

## 2013-10-12 DIAGNOSIS — L89309 Pressure ulcer of unspecified buttock, unspecified stage: Secondary | ICD-10-CM | POA: Diagnosis not present

## 2013-10-14 DIAGNOSIS — E119 Type 2 diabetes mellitus without complications: Secondary | ICD-10-CM | POA: Diagnosis not present

## 2013-10-14 DIAGNOSIS — L8993 Pressure ulcer of unspecified site, stage 3: Secondary | ICD-10-CM | POA: Diagnosis not present

## 2013-10-14 DIAGNOSIS — L89209 Pressure ulcer of unspecified hip, unspecified stage: Secondary | ICD-10-CM | POA: Diagnosis not present

## 2013-10-14 DIAGNOSIS — G35 Multiple sclerosis: Secondary | ICD-10-CM | POA: Diagnosis not present

## 2013-10-14 DIAGNOSIS — L89309 Pressure ulcer of unspecified buttock, unspecified stage: Secondary | ICD-10-CM | POA: Diagnosis not present

## 2013-10-14 DIAGNOSIS — M5430 Sciatica, unspecified side: Secondary | ICD-10-CM | POA: Diagnosis not present

## 2013-10-16 DIAGNOSIS — L8990 Pressure ulcer of unspecified site, unspecified stage: Secondary | ICD-10-CM | POA: Diagnosis not present

## 2013-10-17 DIAGNOSIS — M5430 Sciatica, unspecified side: Secondary | ICD-10-CM | POA: Diagnosis not present

## 2013-10-17 DIAGNOSIS — L89309 Pressure ulcer of unspecified buttock, unspecified stage: Secondary | ICD-10-CM | POA: Diagnosis not present

## 2013-10-17 DIAGNOSIS — E119 Type 2 diabetes mellitus without complications: Secondary | ICD-10-CM | POA: Diagnosis not present

## 2013-10-17 DIAGNOSIS — G35 Multiple sclerosis: Secondary | ICD-10-CM | POA: Diagnosis not present

## 2013-10-17 DIAGNOSIS — L8993 Pressure ulcer of unspecified site, stage 3: Secondary | ICD-10-CM | POA: Diagnosis not present

## 2013-10-17 DIAGNOSIS — L89209 Pressure ulcer of unspecified hip, unspecified stage: Secondary | ICD-10-CM | POA: Diagnosis not present

## 2013-10-18 DIAGNOSIS — M5430 Sciatica, unspecified side: Secondary | ICD-10-CM | POA: Diagnosis not present

## 2013-10-18 DIAGNOSIS — L89309 Pressure ulcer of unspecified buttock, unspecified stage: Secondary | ICD-10-CM | POA: Diagnosis not present

## 2013-10-18 DIAGNOSIS — E119 Type 2 diabetes mellitus without complications: Secondary | ICD-10-CM | POA: Diagnosis not present

## 2013-10-18 DIAGNOSIS — G35 Multiple sclerosis: Secondary | ICD-10-CM | POA: Diagnosis not present

## 2013-10-18 DIAGNOSIS — L89209 Pressure ulcer of unspecified hip, unspecified stage: Secondary | ICD-10-CM | POA: Diagnosis not present

## 2013-10-18 DIAGNOSIS — L8993 Pressure ulcer of unspecified site, stage 3: Secondary | ICD-10-CM | POA: Diagnosis not present

## 2013-10-19 DIAGNOSIS — L89309 Pressure ulcer of unspecified buttock, unspecified stage: Secondary | ICD-10-CM | POA: Diagnosis not present

## 2013-10-19 DIAGNOSIS — L89209 Pressure ulcer of unspecified hip, unspecified stage: Secondary | ICD-10-CM | POA: Diagnosis not present

## 2013-10-19 DIAGNOSIS — L89609 Pressure ulcer of unspecified heel, unspecified stage: Secondary | ICD-10-CM | POA: Diagnosis not present

## 2013-10-19 DIAGNOSIS — L89109 Pressure ulcer of unspecified part of back, unspecified stage: Secondary | ICD-10-CM | POA: Diagnosis not present

## 2013-10-21 DIAGNOSIS — L89309 Pressure ulcer of unspecified buttock, unspecified stage: Secondary | ICD-10-CM | POA: Diagnosis not present

## 2013-10-21 DIAGNOSIS — E119 Type 2 diabetes mellitus without complications: Secondary | ICD-10-CM | POA: Diagnosis not present

## 2013-10-21 DIAGNOSIS — L89209 Pressure ulcer of unspecified hip, unspecified stage: Secondary | ICD-10-CM | POA: Diagnosis not present

## 2013-10-21 DIAGNOSIS — L8993 Pressure ulcer of unspecified site, stage 3: Secondary | ICD-10-CM | POA: Diagnosis not present

## 2013-10-21 DIAGNOSIS — G35 Multiple sclerosis: Secondary | ICD-10-CM | POA: Diagnosis not present

## 2013-10-21 DIAGNOSIS — M5430 Sciatica, unspecified side: Secondary | ICD-10-CM | POA: Diagnosis not present

## 2013-10-24 DIAGNOSIS — L89209 Pressure ulcer of unspecified hip, unspecified stage: Secondary | ICD-10-CM | POA: Diagnosis not present

## 2013-10-24 DIAGNOSIS — L89309 Pressure ulcer of unspecified buttock, unspecified stage: Secondary | ICD-10-CM | POA: Diagnosis not present

## 2013-10-24 DIAGNOSIS — L8993 Pressure ulcer of unspecified site, stage 3: Secondary | ICD-10-CM | POA: Diagnosis not present

## 2013-10-24 DIAGNOSIS — M5430 Sciatica, unspecified side: Secondary | ICD-10-CM | POA: Diagnosis not present

## 2013-10-24 DIAGNOSIS — G35 Multiple sclerosis: Secondary | ICD-10-CM | POA: Diagnosis not present

## 2013-10-24 DIAGNOSIS — E119 Type 2 diabetes mellitus without complications: Secondary | ICD-10-CM | POA: Diagnosis not present

## 2013-10-25 DIAGNOSIS — M5430 Sciatica, unspecified side: Secondary | ICD-10-CM | POA: Diagnosis not present

## 2013-10-25 DIAGNOSIS — L8993 Pressure ulcer of unspecified site, stage 3: Secondary | ICD-10-CM | POA: Diagnosis not present

## 2013-10-25 DIAGNOSIS — E119 Type 2 diabetes mellitus without complications: Secondary | ICD-10-CM | POA: Diagnosis not present

## 2013-10-25 DIAGNOSIS — G35 Multiple sclerosis: Secondary | ICD-10-CM | POA: Diagnosis not present

## 2013-10-25 DIAGNOSIS — L89209 Pressure ulcer of unspecified hip, unspecified stage: Secondary | ICD-10-CM | POA: Diagnosis not present

## 2013-10-25 DIAGNOSIS — L89309 Pressure ulcer of unspecified buttock, unspecified stage: Secondary | ICD-10-CM | POA: Diagnosis not present

## 2013-10-26 DIAGNOSIS — L89309 Pressure ulcer of unspecified buttock, unspecified stage: Secondary | ICD-10-CM | POA: Diagnosis not present

## 2013-10-26 DIAGNOSIS — L89109 Pressure ulcer of unspecified part of back, unspecified stage: Secondary | ICD-10-CM | POA: Diagnosis not present

## 2013-10-26 DIAGNOSIS — L89209 Pressure ulcer of unspecified hip, unspecified stage: Secondary | ICD-10-CM | POA: Diagnosis not present

## 2013-10-26 DIAGNOSIS — L89609 Pressure ulcer of unspecified heel, unspecified stage: Secondary | ICD-10-CM | POA: Diagnosis not present

## 2013-10-27 DIAGNOSIS — L8993 Pressure ulcer of unspecified site, stage 3: Secondary | ICD-10-CM | POA: Diagnosis not present

## 2013-10-27 DIAGNOSIS — M5430 Sciatica, unspecified side: Secondary | ICD-10-CM | POA: Diagnosis not present

## 2013-10-27 DIAGNOSIS — L89309 Pressure ulcer of unspecified buttock, unspecified stage: Secondary | ICD-10-CM | POA: Diagnosis not present

## 2013-10-27 DIAGNOSIS — G35 Multiple sclerosis: Secondary | ICD-10-CM | POA: Diagnosis not present

## 2013-10-27 DIAGNOSIS — E119 Type 2 diabetes mellitus without complications: Secondary | ICD-10-CM | POA: Diagnosis not present

## 2013-10-27 DIAGNOSIS — L89209 Pressure ulcer of unspecified hip, unspecified stage: Secondary | ICD-10-CM | POA: Diagnosis not present

## 2013-10-29 DIAGNOSIS — M5430 Sciatica, unspecified side: Secondary | ICD-10-CM | POA: Diagnosis not present

## 2013-10-29 DIAGNOSIS — L8993 Pressure ulcer of unspecified site, stage 3: Secondary | ICD-10-CM | POA: Diagnosis not present

## 2013-10-29 DIAGNOSIS — G35 Multiple sclerosis: Secondary | ICD-10-CM | POA: Diagnosis not present

## 2013-10-29 DIAGNOSIS — L89309 Pressure ulcer of unspecified buttock, unspecified stage: Secondary | ICD-10-CM | POA: Diagnosis not present

## 2013-10-29 DIAGNOSIS — L89209 Pressure ulcer of unspecified hip, unspecified stage: Secondary | ICD-10-CM | POA: Diagnosis not present

## 2013-10-29 DIAGNOSIS — E119 Type 2 diabetes mellitus without complications: Secondary | ICD-10-CM | POA: Diagnosis not present

## 2013-10-31 DIAGNOSIS — G35 Multiple sclerosis: Secondary | ICD-10-CM | POA: Diagnosis not present

## 2013-10-31 DIAGNOSIS — L8993 Pressure ulcer of unspecified site, stage 3: Secondary | ICD-10-CM | POA: Diagnosis not present

## 2013-10-31 DIAGNOSIS — M5430 Sciatica, unspecified side: Secondary | ICD-10-CM | POA: Diagnosis not present

## 2013-10-31 DIAGNOSIS — E119 Type 2 diabetes mellitus without complications: Secondary | ICD-10-CM | POA: Diagnosis not present

## 2013-10-31 DIAGNOSIS — L89209 Pressure ulcer of unspecified hip, unspecified stage: Secondary | ICD-10-CM | POA: Diagnosis not present

## 2013-10-31 DIAGNOSIS — L89309 Pressure ulcer of unspecified buttock, unspecified stage: Secondary | ICD-10-CM | POA: Diagnosis not present

## 2013-11-01 DIAGNOSIS — L89209 Pressure ulcer of unspecified hip, unspecified stage: Secondary | ICD-10-CM | POA: Diagnosis not present

## 2013-11-01 DIAGNOSIS — L89309 Pressure ulcer of unspecified buttock, unspecified stage: Secondary | ICD-10-CM | POA: Diagnosis not present

## 2013-11-01 DIAGNOSIS — L8993 Pressure ulcer of unspecified site, stage 3: Secondary | ICD-10-CM | POA: Diagnosis not present

## 2013-11-01 DIAGNOSIS — G35 Multiple sclerosis: Secondary | ICD-10-CM | POA: Diagnosis not present

## 2013-11-01 DIAGNOSIS — E119 Type 2 diabetes mellitus without complications: Secondary | ICD-10-CM | POA: Diagnosis not present

## 2013-11-01 DIAGNOSIS — M5430 Sciatica, unspecified side: Secondary | ICD-10-CM | POA: Diagnosis not present

## 2013-11-02 ENCOUNTER — Encounter (HOSPITAL_BASED_OUTPATIENT_CLINIC_OR_DEPARTMENT_OTHER): Payer: Medicare Other | Attending: Internal Medicine

## 2013-11-02 DIAGNOSIS — L8990 Pressure ulcer of unspecified site, unspecified stage: Secondary | ICD-10-CM | POA: Diagnosis not present

## 2013-11-02 DIAGNOSIS — L89899 Pressure ulcer of other site, unspecified stage: Secondary | ICD-10-CM | POA: Insufficient documentation

## 2013-11-02 DIAGNOSIS — L89309 Pressure ulcer of unspecified buttock, unspecified stage: Secondary | ICD-10-CM | POA: Diagnosis not present

## 2013-11-02 DIAGNOSIS — L8992 Pressure ulcer of unspecified site, stage 2: Secondary | ICD-10-CM | POA: Diagnosis not present

## 2013-11-04 DIAGNOSIS — G35 Multiple sclerosis: Secondary | ICD-10-CM | POA: Diagnosis not present

## 2013-11-04 DIAGNOSIS — L89309 Pressure ulcer of unspecified buttock, unspecified stage: Secondary | ICD-10-CM | POA: Diagnosis not present

## 2013-11-04 DIAGNOSIS — L89209 Pressure ulcer of unspecified hip, unspecified stage: Secondary | ICD-10-CM | POA: Diagnosis not present

## 2013-11-04 DIAGNOSIS — L8993 Pressure ulcer of unspecified site, stage 3: Secondary | ICD-10-CM | POA: Diagnosis not present

## 2013-11-04 DIAGNOSIS — E119 Type 2 diabetes mellitus without complications: Secondary | ICD-10-CM | POA: Diagnosis not present

## 2013-11-04 DIAGNOSIS — M5430 Sciatica, unspecified side: Secondary | ICD-10-CM | POA: Diagnosis not present

## 2013-11-07 DIAGNOSIS — L8993 Pressure ulcer of unspecified site, stage 3: Secondary | ICD-10-CM | POA: Diagnosis not present

## 2013-11-07 DIAGNOSIS — L89209 Pressure ulcer of unspecified hip, unspecified stage: Secondary | ICD-10-CM | POA: Diagnosis not present

## 2013-11-07 DIAGNOSIS — G35 Multiple sclerosis: Secondary | ICD-10-CM | POA: Diagnosis not present

## 2013-11-07 DIAGNOSIS — L89309 Pressure ulcer of unspecified buttock, unspecified stage: Secondary | ICD-10-CM | POA: Diagnosis not present

## 2013-11-07 DIAGNOSIS — M5430 Sciatica, unspecified side: Secondary | ICD-10-CM | POA: Diagnosis not present

## 2013-11-07 DIAGNOSIS — E119 Type 2 diabetes mellitus without complications: Secondary | ICD-10-CM | POA: Diagnosis not present

## 2013-11-08 DIAGNOSIS — M5430 Sciatica, unspecified side: Secondary | ICD-10-CM | POA: Diagnosis not present

## 2013-11-08 DIAGNOSIS — E119 Type 2 diabetes mellitus without complications: Secondary | ICD-10-CM | POA: Diagnosis not present

## 2013-11-08 DIAGNOSIS — L8993 Pressure ulcer of unspecified site, stage 3: Secondary | ICD-10-CM | POA: Diagnosis not present

## 2013-11-08 DIAGNOSIS — G35 Multiple sclerosis: Secondary | ICD-10-CM | POA: Diagnosis not present

## 2013-11-08 DIAGNOSIS — L89309 Pressure ulcer of unspecified buttock, unspecified stage: Secondary | ICD-10-CM | POA: Diagnosis not present

## 2013-11-08 DIAGNOSIS — L89209 Pressure ulcer of unspecified hip, unspecified stage: Secondary | ICD-10-CM | POA: Diagnosis not present

## 2013-11-09 DIAGNOSIS — G35 Multiple sclerosis: Secondary | ICD-10-CM | POA: Diagnosis not present

## 2013-11-09 DIAGNOSIS — E119 Type 2 diabetes mellitus without complications: Secondary | ICD-10-CM | POA: Diagnosis not present

## 2013-11-09 DIAGNOSIS — L89209 Pressure ulcer of unspecified hip, unspecified stage: Secondary | ICD-10-CM | POA: Diagnosis not present

## 2013-11-09 DIAGNOSIS — L89309 Pressure ulcer of unspecified buttock, unspecified stage: Secondary | ICD-10-CM | POA: Diagnosis not present

## 2013-11-09 DIAGNOSIS — M5430 Sciatica, unspecified side: Secondary | ICD-10-CM | POA: Diagnosis not present

## 2013-11-09 DIAGNOSIS — L8993 Pressure ulcer of unspecified site, stage 3: Secondary | ICD-10-CM | POA: Diagnosis not present

## 2013-11-11 DIAGNOSIS — L8993 Pressure ulcer of unspecified site, stage 3: Secondary | ICD-10-CM | POA: Diagnosis not present

## 2013-11-11 DIAGNOSIS — L89309 Pressure ulcer of unspecified buttock, unspecified stage: Secondary | ICD-10-CM | POA: Diagnosis not present

## 2013-11-11 DIAGNOSIS — G35 Multiple sclerosis: Secondary | ICD-10-CM | POA: Diagnosis not present

## 2013-11-11 DIAGNOSIS — E119 Type 2 diabetes mellitus without complications: Secondary | ICD-10-CM | POA: Diagnosis not present

## 2013-11-11 DIAGNOSIS — L89209 Pressure ulcer of unspecified hip, unspecified stage: Secondary | ICD-10-CM | POA: Diagnosis not present

## 2013-11-11 DIAGNOSIS — M5430 Sciatica, unspecified side: Secondary | ICD-10-CM | POA: Diagnosis not present

## 2013-11-14 DIAGNOSIS — E119 Type 2 diabetes mellitus without complications: Secondary | ICD-10-CM | POA: Diagnosis not present

## 2013-11-14 DIAGNOSIS — M5430 Sciatica, unspecified side: Secondary | ICD-10-CM | POA: Diagnosis not present

## 2013-11-14 DIAGNOSIS — L89209 Pressure ulcer of unspecified hip, unspecified stage: Secondary | ICD-10-CM | POA: Diagnosis not present

## 2013-11-14 DIAGNOSIS — L89309 Pressure ulcer of unspecified buttock, unspecified stage: Secondary | ICD-10-CM | POA: Diagnosis not present

## 2013-11-14 DIAGNOSIS — L8993 Pressure ulcer of unspecified site, stage 3: Secondary | ICD-10-CM | POA: Diagnosis not present

## 2013-11-14 DIAGNOSIS — G35 Multiple sclerosis: Secondary | ICD-10-CM | POA: Diagnosis not present

## 2013-11-15 DIAGNOSIS — L89209 Pressure ulcer of unspecified hip, unspecified stage: Secondary | ICD-10-CM | POA: Diagnosis not present

## 2013-11-15 DIAGNOSIS — L8993 Pressure ulcer of unspecified site, stage 3: Secondary | ICD-10-CM | POA: Diagnosis not present

## 2013-11-15 DIAGNOSIS — E119 Type 2 diabetes mellitus without complications: Secondary | ICD-10-CM | POA: Diagnosis not present

## 2013-11-15 DIAGNOSIS — L89309 Pressure ulcer of unspecified buttock, unspecified stage: Secondary | ICD-10-CM | POA: Diagnosis not present

## 2013-11-15 DIAGNOSIS — G35 Multiple sclerosis: Secondary | ICD-10-CM | POA: Diagnosis not present

## 2013-11-15 DIAGNOSIS — M5430 Sciatica, unspecified side: Secondary | ICD-10-CM | POA: Diagnosis not present

## 2013-11-16 ENCOUNTER — Other Ambulatory Visit (HOSPITAL_COMMUNITY): Payer: Self-pay | Admitting: Urology

## 2013-11-16 DIAGNOSIS — L89899 Pressure ulcer of other site, unspecified stage: Secondary | ICD-10-CM | POA: Diagnosis not present

## 2013-11-16 DIAGNOSIS — L89309 Pressure ulcer of unspecified buttock, unspecified stage: Secondary | ICD-10-CM | POA: Diagnosis not present

## 2013-11-16 DIAGNOSIS — L8992 Pressure ulcer of unspecified site, stage 2: Secondary | ICD-10-CM | POA: Diagnosis not present

## 2013-11-16 DIAGNOSIS — L8990 Pressure ulcer of unspecified site, unspecified stage: Secondary | ICD-10-CM | POA: Diagnosis not present

## 2013-11-16 DIAGNOSIS — N200 Calculus of kidney: Secondary | ICD-10-CM

## 2013-11-18 DIAGNOSIS — M5430 Sciatica, unspecified side: Secondary | ICD-10-CM | POA: Diagnosis not present

## 2013-11-18 DIAGNOSIS — G35 Multiple sclerosis: Secondary | ICD-10-CM | POA: Diagnosis not present

## 2013-11-18 DIAGNOSIS — L89209 Pressure ulcer of unspecified hip, unspecified stage: Secondary | ICD-10-CM | POA: Diagnosis not present

## 2013-11-18 DIAGNOSIS — L8993 Pressure ulcer of unspecified site, stage 3: Secondary | ICD-10-CM | POA: Diagnosis not present

## 2013-11-18 DIAGNOSIS — L89309 Pressure ulcer of unspecified buttock, unspecified stage: Secondary | ICD-10-CM | POA: Diagnosis not present

## 2013-11-18 DIAGNOSIS — E119 Type 2 diabetes mellitus without complications: Secondary | ICD-10-CM | POA: Diagnosis not present

## 2013-11-21 DIAGNOSIS — L8993 Pressure ulcer of unspecified site, stage 3: Secondary | ICD-10-CM | POA: Diagnosis not present

## 2013-11-21 DIAGNOSIS — G35 Multiple sclerosis: Secondary | ICD-10-CM | POA: Diagnosis not present

## 2013-11-21 DIAGNOSIS — M5430 Sciatica, unspecified side: Secondary | ICD-10-CM | POA: Diagnosis not present

## 2013-11-21 DIAGNOSIS — E119 Type 2 diabetes mellitus without complications: Secondary | ICD-10-CM | POA: Diagnosis not present

## 2013-11-21 DIAGNOSIS — L89309 Pressure ulcer of unspecified buttock, unspecified stage: Secondary | ICD-10-CM | POA: Diagnosis not present

## 2013-11-21 DIAGNOSIS — L89209 Pressure ulcer of unspecified hip, unspecified stage: Secondary | ICD-10-CM | POA: Diagnosis not present

## 2013-11-23 ENCOUNTER — Ambulatory Visit (HOSPITAL_COMMUNITY)
Admission: RE | Admit: 2013-11-23 | Discharge: 2013-11-23 | Disposition: A | Discharge status: Home / Self Care | Payer: Medicare Other | Source: Ambulatory Visit | Attending: Urology | Admitting: Urology

## 2013-11-23 DIAGNOSIS — M899 Disorder of bone, unspecified: Secondary | ICD-10-CM | POA: Insufficient documentation

## 2013-11-23 DIAGNOSIS — M1610 Unilateral primary osteoarthritis, unspecified hip: Secondary | ICD-10-CM | POA: Insufficient documentation

## 2013-11-23 DIAGNOSIS — L89899 Pressure ulcer of other site, unspecified stage: Secondary | ICD-10-CM | POA: Diagnosis not present

## 2013-11-23 DIAGNOSIS — M47817 Spondylosis without myelopathy or radiculopathy, lumbosacral region: Secondary | ICD-10-CM | POA: Diagnosis not present

## 2013-11-23 DIAGNOSIS — G35 Multiple sclerosis: Secondary | ICD-10-CM | POA: Diagnosis not present

## 2013-11-23 DIAGNOSIS — M949 Disorder of cartilage, unspecified: Secondary | ICD-10-CM | POA: Diagnosis not present

## 2013-11-23 DIAGNOSIS — N200 Calculus of kidney: Secondary | ICD-10-CM | POA: Diagnosis not present

## 2013-11-23 DIAGNOSIS — X58XXXA Exposure to other specified factors, initial encounter: Secondary | ICD-10-CM | POA: Diagnosis not present

## 2013-11-23 DIAGNOSIS — M169 Osteoarthritis of hip, unspecified: Secondary | ICD-10-CM | POA: Diagnosis not present

## 2013-11-23 DIAGNOSIS — L8990 Pressure ulcer of unspecified site, unspecified stage: Secondary | ICD-10-CM | POA: Diagnosis not present

## 2013-11-23 DIAGNOSIS — L8992 Pressure ulcer of unspecified site, stage 2: Secondary | ICD-10-CM | POA: Diagnosis not present

## 2013-11-23 DIAGNOSIS — L89309 Pressure ulcer of unspecified buttock, unspecified stage: Secondary | ICD-10-CM | POA: Diagnosis not present

## 2013-11-23 DIAGNOSIS — S31809A Unspecified open wound of unspecified buttock, initial encounter: Secondary | ICD-10-CM | POA: Diagnosis not present

## 2013-11-25 DIAGNOSIS — L89209 Pressure ulcer of unspecified hip, unspecified stage: Secondary | ICD-10-CM | POA: Diagnosis not present

## 2013-11-25 DIAGNOSIS — E119 Type 2 diabetes mellitus without complications: Secondary | ICD-10-CM | POA: Diagnosis not present

## 2013-11-25 DIAGNOSIS — M5430 Sciatica, unspecified side: Secondary | ICD-10-CM | POA: Diagnosis not present

## 2013-11-25 DIAGNOSIS — L89309 Pressure ulcer of unspecified buttock, unspecified stage: Secondary | ICD-10-CM | POA: Diagnosis not present

## 2013-11-25 DIAGNOSIS — L8993 Pressure ulcer of unspecified site, stage 3: Secondary | ICD-10-CM | POA: Diagnosis not present

## 2013-11-25 DIAGNOSIS — G35 Multiple sclerosis: Secondary | ICD-10-CM | POA: Diagnosis not present

## 2013-11-27 DIAGNOSIS — R319 Hematuria, unspecified: Secondary | ICD-10-CM | POA: Diagnosis not present

## 2013-11-28 DIAGNOSIS — L8993 Pressure ulcer of unspecified site, stage 3: Secondary | ICD-10-CM | POA: Diagnosis not present

## 2013-11-28 DIAGNOSIS — L89309 Pressure ulcer of unspecified buttock, unspecified stage: Secondary | ICD-10-CM | POA: Diagnosis not present

## 2013-11-28 DIAGNOSIS — M5430 Sciatica, unspecified side: Secondary | ICD-10-CM | POA: Diagnosis not present

## 2013-11-28 DIAGNOSIS — G35 Multiple sclerosis: Secondary | ICD-10-CM | POA: Diagnosis not present

## 2013-11-28 DIAGNOSIS — L89209 Pressure ulcer of unspecified hip, unspecified stage: Secondary | ICD-10-CM | POA: Diagnosis not present

## 2013-11-28 DIAGNOSIS — E119 Type 2 diabetes mellitus without complications: Secondary | ICD-10-CM | POA: Diagnosis not present

## 2013-11-29 DIAGNOSIS — L89209 Pressure ulcer of unspecified hip, unspecified stage: Secondary | ICD-10-CM | POA: Diagnosis not present

## 2013-11-29 DIAGNOSIS — L89309 Pressure ulcer of unspecified buttock, unspecified stage: Secondary | ICD-10-CM | POA: Diagnosis not present

## 2013-11-29 DIAGNOSIS — L8993 Pressure ulcer of unspecified site, stage 3: Secondary | ICD-10-CM | POA: Diagnosis not present

## 2013-11-29 DIAGNOSIS — E119 Type 2 diabetes mellitus without complications: Secondary | ICD-10-CM | POA: Diagnosis not present

## 2013-11-29 DIAGNOSIS — G35 Multiple sclerosis: Secondary | ICD-10-CM | POA: Diagnosis not present

## 2013-11-29 DIAGNOSIS — M5430 Sciatica, unspecified side: Secondary | ICD-10-CM | POA: Diagnosis not present

## 2013-11-30 DIAGNOSIS — M5430 Sciatica, unspecified side: Secondary | ICD-10-CM | POA: Diagnosis not present

## 2013-11-30 DIAGNOSIS — L89309 Pressure ulcer of unspecified buttock, unspecified stage: Secondary | ICD-10-CM | POA: Diagnosis not present

## 2013-11-30 DIAGNOSIS — L89209 Pressure ulcer of unspecified hip, unspecified stage: Secondary | ICD-10-CM | POA: Diagnosis not present

## 2013-11-30 DIAGNOSIS — E119 Type 2 diabetes mellitus without complications: Secondary | ICD-10-CM | POA: Diagnosis not present

## 2013-11-30 DIAGNOSIS — G35 Multiple sclerosis: Secondary | ICD-10-CM | POA: Diagnosis not present

## 2013-11-30 DIAGNOSIS — L8993 Pressure ulcer of unspecified site, stage 3: Secondary | ICD-10-CM | POA: Diagnosis not present

## 2013-12-02 DIAGNOSIS — E119 Type 2 diabetes mellitus without complications: Secondary | ICD-10-CM | POA: Diagnosis not present

## 2013-12-02 DIAGNOSIS — M5430 Sciatica, unspecified side: Secondary | ICD-10-CM | POA: Diagnosis not present

## 2013-12-02 DIAGNOSIS — L89209 Pressure ulcer of unspecified hip, unspecified stage: Secondary | ICD-10-CM | POA: Diagnosis not present

## 2013-12-02 DIAGNOSIS — G35 Multiple sclerosis: Secondary | ICD-10-CM | POA: Diagnosis not present

## 2013-12-02 DIAGNOSIS — L89309 Pressure ulcer of unspecified buttock, unspecified stage: Secondary | ICD-10-CM | POA: Diagnosis not present

## 2013-12-02 DIAGNOSIS — L8993 Pressure ulcer of unspecified site, stage 3: Secondary | ICD-10-CM | POA: Diagnosis not present

## 2013-12-03 DIAGNOSIS — M5430 Sciatica, unspecified side: Secondary | ICD-10-CM | POA: Diagnosis not present

## 2013-12-03 DIAGNOSIS — E119 Type 2 diabetes mellitus without complications: Secondary | ICD-10-CM | POA: Diagnosis not present

## 2013-12-03 DIAGNOSIS — L89309 Pressure ulcer of unspecified buttock, unspecified stage: Secondary | ICD-10-CM | POA: Diagnosis not present

## 2013-12-03 DIAGNOSIS — L8993 Pressure ulcer of unspecified site, stage 3: Secondary | ICD-10-CM | POA: Diagnosis not present

## 2013-12-03 DIAGNOSIS — G35 Multiple sclerosis: Secondary | ICD-10-CM | POA: Diagnosis not present

## 2013-12-03 DIAGNOSIS — L89209 Pressure ulcer of unspecified hip, unspecified stage: Secondary | ICD-10-CM | POA: Diagnosis not present

## 2013-12-05 DIAGNOSIS — G35 Multiple sclerosis: Secondary | ICD-10-CM | POA: Diagnosis not present

## 2013-12-05 DIAGNOSIS — L89213 Pressure ulcer of right hip, stage 3: Secondary | ICD-10-CM | POA: Diagnosis not present

## 2013-12-05 DIAGNOSIS — I10 Essential (primary) hypertension: Secondary | ICD-10-CM | POA: Diagnosis not present

## 2013-12-05 DIAGNOSIS — L89323 Pressure ulcer of left buttock, stage 3: Secondary | ICD-10-CM | POA: Diagnosis not present

## 2013-12-05 DIAGNOSIS — E119 Type 2 diabetes mellitus without complications: Secondary | ICD-10-CM | POA: Diagnosis not present

## 2013-12-05 DIAGNOSIS — M5430 Sciatica, unspecified side: Secondary | ICD-10-CM | POA: Diagnosis not present

## 2013-12-07 ENCOUNTER — Encounter (HOSPITAL_BASED_OUTPATIENT_CLINIC_OR_DEPARTMENT_OTHER): Payer: Medicare Other | Attending: Internal Medicine

## 2013-12-07 DIAGNOSIS — L89209 Pressure ulcer of unspecified hip, unspecified stage: Secondary | ICD-10-CM | POA: Insufficient documentation

## 2013-12-07 DIAGNOSIS — L89309 Pressure ulcer of unspecified buttock, unspecified stage: Secondary | ICD-10-CM | POA: Diagnosis not present

## 2013-12-07 DIAGNOSIS — L8993 Pressure ulcer of unspecified site, stage 3: Secondary | ICD-10-CM | POA: Insufficient documentation

## 2013-12-07 DIAGNOSIS — L8992 Pressure ulcer of unspecified site, stage 2: Secondary | ICD-10-CM | POA: Insufficient documentation

## 2013-12-11 DIAGNOSIS — N189 Chronic kidney disease, unspecified: Secondary | ICD-10-CM | POA: Diagnosis not present

## 2013-12-11 DIAGNOSIS — N319 Neuromuscular dysfunction of bladder, unspecified: Secondary | ICD-10-CM | POA: Diagnosis not present

## 2013-12-11 DIAGNOSIS — N200 Calculus of kidney: Secondary | ICD-10-CM | POA: Diagnosis not present

## 2013-12-21 DIAGNOSIS — L89309 Pressure ulcer of unspecified buttock, unspecified stage: Secondary | ICD-10-CM | POA: Diagnosis not present

## 2013-12-21 DIAGNOSIS — L8992 Pressure ulcer of unspecified site, stage 2: Secondary | ICD-10-CM | POA: Diagnosis not present

## 2013-12-21 DIAGNOSIS — L89209 Pressure ulcer of unspecified hip, unspecified stage: Secondary | ICD-10-CM | POA: Diagnosis not present

## 2013-12-21 DIAGNOSIS — L8993 Pressure ulcer of unspecified site, stage 3: Secondary | ICD-10-CM | POA: Diagnosis not present

## 2013-12-25 DIAGNOSIS — N183 Chronic kidney disease, stage 3 unspecified: Secondary | ICD-10-CM | POA: Diagnosis not present

## 2013-12-25 DIAGNOSIS — E1129 Type 2 diabetes mellitus with other diabetic kidney complication: Secondary | ICD-10-CM | POA: Diagnosis not present

## 2013-12-25 DIAGNOSIS — D649 Anemia, unspecified: Secondary | ICD-10-CM | POA: Diagnosis not present

## 2013-12-25 DIAGNOSIS — Z23 Encounter for immunization: Secondary | ICD-10-CM | POA: Diagnosis not present

## 2013-12-25 DIAGNOSIS — I10 Essential (primary) hypertension: Secondary | ICD-10-CM | POA: Diagnosis not present

## 2014-01-03 DIAGNOSIS — N183 Chronic kidney disease, stage 3 unspecified: Secondary | ICD-10-CM | POA: Diagnosis not present

## 2014-01-03 DIAGNOSIS — D649 Anemia, unspecified: Secondary | ICD-10-CM | POA: Diagnosis not present

## 2014-01-03 DIAGNOSIS — I10 Essential (primary) hypertension: Secondary | ICD-10-CM | POA: Diagnosis not present

## 2014-01-03 DIAGNOSIS — E1129 Type 2 diabetes mellitus with other diabetic kidney complication: Secondary | ICD-10-CM | POA: Diagnosis not present

## 2014-01-04 ENCOUNTER — Encounter (HOSPITAL_BASED_OUTPATIENT_CLINIC_OR_DEPARTMENT_OTHER): Payer: Medicare Other | Attending: Internal Medicine

## 2014-01-04 DIAGNOSIS — L89309 Pressure ulcer of unspecified buttock, unspecified stage: Secondary | ICD-10-CM | POA: Insufficient documentation

## 2014-01-04 DIAGNOSIS — L89109 Pressure ulcer of unspecified part of back, unspecified stage: Secondary | ICD-10-CM | POA: Insufficient documentation

## 2014-01-04 DIAGNOSIS — L8993 Pressure ulcer of unspecified site, stage 3: Secondary | ICD-10-CM | POA: Insufficient documentation

## 2014-01-18 DIAGNOSIS — L89309 Pressure ulcer of unspecified buttock, unspecified stage: Secondary | ICD-10-CM | POA: Diagnosis not present

## 2014-01-18 DIAGNOSIS — L8993 Pressure ulcer of unspecified site, stage 3: Secondary | ICD-10-CM | POA: Diagnosis not present

## 2014-01-18 DIAGNOSIS — L89109 Pressure ulcer of unspecified part of back, unspecified stage: Secondary | ICD-10-CM | POA: Diagnosis not present

## 2014-02-01 ENCOUNTER — Encounter (HOSPITAL_BASED_OUTPATIENT_CLINIC_OR_DEPARTMENT_OTHER): Payer: Medicare Other | Attending: Internal Medicine

## 2014-02-01 DIAGNOSIS — L89212 Pressure ulcer of right hip, stage 2: Secondary | ICD-10-CM | POA: Insufficient documentation

## 2014-02-01 DIAGNOSIS — L89313 Pressure ulcer of right buttock, stage 3: Secondary | ICD-10-CM | POA: Insufficient documentation

## 2014-02-01 DIAGNOSIS — L89323 Pressure ulcer of left buttock, stage 3: Secondary | ICD-10-CM | POA: Insufficient documentation

## 2014-02-03 DIAGNOSIS — M5430 Sciatica, unspecified side: Secondary | ICD-10-CM | POA: Diagnosis not present

## 2014-02-03 DIAGNOSIS — L89323 Pressure ulcer of left buttock, stage 3: Secondary | ICD-10-CM | POA: Diagnosis not present

## 2014-02-03 DIAGNOSIS — G35 Multiple sclerosis: Secondary | ICD-10-CM | POA: Diagnosis not present

## 2014-02-03 DIAGNOSIS — E119 Type 2 diabetes mellitus without complications: Secondary | ICD-10-CM | POA: Diagnosis not present

## 2014-02-03 DIAGNOSIS — I10 Essential (primary) hypertension: Secondary | ICD-10-CM | POA: Diagnosis not present

## 2014-02-03 DIAGNOSIS — L89213 Pressure ulcer of right hip, stage 3: Secondary | ICD-10-CM | POA: Diagnosis not present

## 2014-02-05 DIAGNOSIS — L89213 Pressure ulcer of right hip, stage 3: Secondary | ICD-10-CM | POA: Diagnosis not present

## 2014-02-05 DIAGNOSIS — L89323 Pressure ulcer of left buttock, stage 3: Secondary | ICD-10-CM | POA: Diagnosis not present

## 2014-02-05 DIAGNOSIS — M5430 Sciatica, unspecified side: Secondary | ICD-10-CM | POA: Diagnosis not present

## 2014-02-05 DIAGNOSIS — G35 Multiple sclerosis: Secondary | ICD-10-CM | POA: Diagnosis not present

## 2014-02-05 DIAGNOSIS — I10 Essential (primary) hypertension: Secondary | ICD-10-CM | POA: Diagnosis not present

## 2014-02-05 DIAGNOSIS — E119 Type 2 diabetes mellitus without complications: Secondary | ICD-10-CM | POA: Diagnosis not present

## 2014-02-06 DIAGNOSIS — I10 Essential (primary) hypertension: Secondary | ICD-10-CM | POA: Diagnosis not present

## 2014-02-06 DIAGNOSIS — G35 Multiple sclerosis: Secondary | ICD-10-CM | POA: Diagnosis not present

## 2014-02-06 DIAGNOSIS — L89323 Pressure ulcer of left buttock, stage 3: Secondary | ICD-10-CM | POA: Diagnosis not present

## 2014-02-06 DIAGNOSIS — M5430 Sciatica, unspecified side: Secondary | ICD-10-CM | POA: Diagnosis not present

## 2014-02-06 DIAGNOSIS — E119 Type 2 diabetes mellitus without complications: Secondary | ICD-10-CM | POA: Diagnosis not present

## 2014-02-06 DIAGNOSIS — L89213 Pressure ulcer of right hip, stage 3: Secondary | ICD-10-CM | POA: Diagnosis not present

## 2014-02-08 DIAGNOSIS — M5430 Sciatica, unspecified side: Secondary | ICD-10-CM | POA: Diagnosis not present

## 2014-02-08 DIAGNOSIS — E119 Type 2 diabetes mellitus without complications: Secondary | ICD-10-CM | POA: Diagnosis not present

## 2014-02-08 DIAGNOSIS — L89213 Pressure ulcer of right hip, stage 3: Secondary | ICD-10-CM | POA: Diagnosis not present

## 2014-02-08 DIAGNOSIS — G35 Multiple sclerosis: Secondary | ICD-10-CM | POA: Diagnosis not present

## 2014-02-08 DIAGNOSIS — I10 Essential (primary) hypertension: Secondary | ICD-10-CM | POA: Diagnosis not present

## 2014-02-08 DIAGNOSIS — L89323 Pressure ulcer of left buttock, stage 3: Secondary | ICD-10-CM | POA: Diagnosis not present

## 2014-02-10 DIAGNOSIS — M5430 Sciatica, unspecified side: Secondary | ICD-10-CM | POA: Diagnosis not present

## 2014-02-10 DIAGNOSIS — I10 Essential (primary) hypertension: Secondary | ICD-10-CM | POA: Diagnosis not present

## 2014-02-10 DIAGNOSIS — G35 Multiple sclerosis: Secondary | ICD-10-CM | POA: Diagnosis not present

## 2014-02-10 DIAGNOSIS — L89323 Pressure ulcer of left buttock, stage 3: Secondary | ICD-10-CM | POA: Diagnosis not present

## 2014-02-10 DIAGNOSIS — E119 Type 2 diabetes mellitus without complications: Secondary | ICD-10-CM | POA: Diagnosis not present

## 2014-02-10 DIAGNOSIS — L89213 Pressure ulcer of right hip, stage 3: Secondary | ICD-10-CM | POA: Diagnosis not present

## 2014-02-13 DIAGNOSIS — M5430 Sciatica, unspecified side: Secondary | ICD-10-CM | POA: Diagnosis not present

## 2014-02-13 DIAGNOSIS — G35 Multiple sclerosis: Secondary | ICD-10-CM | POA: Diagnosis not present

## 2014-02-13 DIAGNOSIS — L89323 Pressure ulcer of left buttock, stage 3: Secondary | ICD-10-CM | POA: Diagnosis not present

## 2014-02-13 DIAGNOSIS — I10 Essential (primary) hypertension: Secondary | ICD-10-CM | POA: Diagnosis not present

## 2014-02-13 DIAGNOSIS — E119 Type 2 diabetes mellitus without complications: Secondary | ICD-10-CM | POA: Diagnosis not present

## 2014-02-13 DIAGNOSIS — L89213 Pressure ulcer of right hip, stage 3: Secondary | ICD-10-CM | POA: Diagnosis not present

## 2014-02-15 DIAGNOSIS — L89313 Pressure ulcer of right buttock, stage 3: Secondary | ICD-10-CM | POA: Diagnosis not present

## 2014-02-15 DIAGNOSIS — L89323 Pressure ulcer of left buttock, stage 3: Secondary | ICD-10-CM | POA: Diagnosis not present

## 2014-02-15 DIAGNOSIS — L89212 Pressure ulcer of right hip, stage 2: Secondary | ICD-10-CM | POA: Diagnosis not present

## 2014-02-17 DIAGNOSIS — L89323 Pressure ulcer of left buttock, stage 3: Secondary | ICD-10-CM | POA: Diagnosis not present

## 2014-02-17 DIAGNOSIS — I10 Essential (primary) hypertension: Secondary | ICD-10-CM | POA: Diagnosis not present

## 2014-02-17 DIAGNOSIS — M5430 Sciatica, unspecified side: Secondary | ICD-10-CM | POA: Diagnosis not present

## 2014-02-17 DIAGNOSIS — E119 Type 2 diabetes mellitus without complications: Secondary | ICD-10-CM | POA: Diagnosis not present

## 2014-02-17 DIAGNOSIS — L89213 Pressure ulcer of right hip, stage 3: Secondary | ICD-10-CM | POA: Diagnosis not present

## 2014-02-17 DIAGNOSIS — G35 Multiple sclerosis: Secondary | ICD-10-CM | POA: Diagnosis not present

## 2014-02-20 DIAGNOSIS — M5430 Sciatica, unspecified side: Secondary | ICD-10-CM | POA: Diagnosis not present

## 2014-02-20 DIAGNOSIS — L89323 Pressure ulcer of left buttock, stage 3: Secondary | ICD-10-CM | POA: Diagnosis not present

## 2014-02-20 DIAGNOSIS — L89213 Pressure ulcer of right hip, stage 3: Secondary | ICD-10-CM | POA: Diagnosis not present

## 2014-02-20 DIAGNOSIS — G35 Multiple sclerosis: Secondary | ICD-10-CM | POA: Diagnosis not present

## 2014-02-20 DIAGNOSIS — I10 Essential (primary) hypertension: Secondary | ICD-10-CM | POA: Diagnosis not present

## 2014-02-20 DIAGNOSIS — E119 Type 2 diabetes mellitus without complications: Secondary | ICD-10-CM | POA: Diagnosis not present

## 2014-02-22 DIAGNOSIS — L89213 Pressure ulcer of right hip, stage 3: Secondary | ICD-10-CM | POA: Diagnosis not present

## 2014-02-22 DIAGNOSIS — G35 Multiple sclerosis: Secondary | ICD-10-CM | POA: Diagnosis not present

## 2014-02-22 DIAGNOSIS — E119 Type 2 diabetes mellitus without complications: Secondary | ICD-10-CM | POA: Diagnosis not present

## 2014-02-22 DIAGNOSIS — L89323 Pressure ulcer of left buttock, stage 3: Secondary | ICD-10-CM | POA: Diagnosis not present

## 2014-02-22 DIAGNOSIS — M5430 Sciatica, unspecified side: Secondary | ICD-10-CM | POA: Diagnosis not present

## 2014-02-22 DIAGNOSIS — I10 Essential (primary) hypertension: Secondary | ICD-10-CM | POA: Diagnosis not present

## 2014-02-24 DIAGNOSIS — I10 Essential (primary) hypertension: Secondary | ICD-10-CM | POA: Diagnosis not present

## 2014-02-24 DIAGNOSIS — L89213 Pressure ulcer of right hip, stage 3: Secondary | ICD-10-CM | POA: Diagnosis not present

## 2014-02-24 DIAGNOSIS — G35 Multiple sclerosis: Secondary | ICD-10-CM | POA: Diagnosis not present

## 2014-02-24 DIAGNOSIS — E119 Type 2 diabetes mellitus without complications: Secondary | ICD-10-CM | POA: Diagnosis not present

## 2014-02-24 DIAGNOSIS — L89323 Pressure ulcer of left buttock, stage 3: Secondary | ICD-10-CM | POA: Diagnosis not present

## 2014-02-24 DIAGNOSIS — M5430 Sciatica, unspecified side: Secondary | ICD-10-CM | POA: Diagnosis not present

## 2014-02-26 DIAGNOSIS — G35 Multiple sclerosis: Secondary | ICD-10-CM | POA: Diagnosis not present

## 2014-02-26 DIAGNOSIS — M5430 Sciatica, unspecified side: Secondary | ICD-10-CM | POA: Diagnosis not present

## 2014-02-26 DIAGNOSIS — E119 Type 2 diabetes mellitus without complications: Secondary | ICD-10-CM | POA: Diagnosis not present

## 2014-02-26 DIAGNOSIS — L89213 Pressure ulcer of right hip, stage 3: Secondary | ICD-10-CM | POA: Diagnosis not present

## 2014-02-26 DIAGNOSIS — L89323 Pressure ulcer of left buttock, stage 3: Secondary | ICD-10-CM | POA: Diagnosis not present

## 2014-02-26 DIAGNOSIS — I10 Essential (primary) hypertension: Secondary | ICD-10-CM | POA: Diagnosis not present

## 2014-02-28 DIAGNOSIS — I10 Essential (primary) hypertension: Secondary | ICD-10-CM | POA: Diagnosis not present

## 2014-02-28 DIAGNOSIS — G35 Multiple sclerosis: Secondary | ICD-10-CM | POA: Diagnosis not present

## 2014-02-28 DIAGNOSIS — L89213 Pressure ulcer of right hip, stage 3: Secondary | ICD-10-CM | POA: Diagnosis not present

## 2014-02-28 DIAGNOSIS — M5430 Sciatica, unspecified side: Secondary | ICD-10-CM | POA: Diagnosis not present

## 2014-02-28 DIAGNOSIS — L89323 Pressure ulcer of left buttock, stage 3: Secondary | ICD-10-CM | POA: Diagnosis not present

## 2014-02-28 DIAGNOSIS — E119 Type 2 diabetes mellitus without complications: Secondary | ICD-10-CM | POA: Diagnosis not present

## 2014-03-01 DIAGNOSIS — L89313 Pressure ulcer of right buttock, stage 3: Secondary | ICD-10-CM | POA: Diagnosis not present

## 2014-03-01 DIAGNOSIS — L89323 Pressure ulcer of left buttock, stage 3: Secondary | ICD-10-CM | POA: Diagnosis not present

## 2014-03-01 DIAGNOSIS — L89212 Pressure ulcer of right hip, stage 2: Secondary | ICD-10-CM | POA: Diagnosis not present

## 2014-03-02 DIAGNOSIS — Z23 Encounter for immunization: Secondary | ICD-10-CM | POA: Diagnosis not present

## 2014-03-03 DIAGNOSIS — M5430 Sciatica, unspecified side: Secondary | ICD-10-CM | POA: Diagnosis not present

## 2014-03-03 DIAGNOSIS — I10 Essential (primary) hypertension: Secondary | ICD-10-CM | POA: Diagnosis not present

## 2014-03-03 DIAGNOSIS — L89323 Pressure ulcer of left buttock, stage 3: Secondary | ICD-10-CM | POA: Diagnosis not present

## 2014-03-03 DIAGNOSIS — E119 Type 2 diabetes mellitus without complications: Secondary | ICD-10-CM | POA: Diagnosis not present

## 2014-03-03 DIAGNOSIS — G35 Multiple sclerosis: Secondary | ICD-10-CM | POA: Diagnosis not present

## 2014-03-03 DIAGNOSIS — L89213 Pressure ulcer of right hip, stage 3: Secondary | ICD-10-CM | POA: Diagnosis not present

## 2014-03-06 DIAGNOSIS — L89323 Pressure ulcer of left buttock, stage 3: Secondary | ICD-10-CM | POA: Diagnosis not present

## 2014-03-06 DIAGNOSIS — M5430 Sciatica, unspecified side: Secondary | ICD-10-CM | POA: Diagnosis not present

## 2014-03-06 DIAGNOSIS — E119 Type 2 diabetes mellitus without complications: Secondary | ICD-10-CM | POA: Diagnosis not present

## 2014-03-06 DIAGNOSIS — I10 Essential (primary) hypertension: Secondary | ICD-10-CM | POA: Diagnosis not present

## 2014-03-06 DIAGNOSIS — L89213 Pressure ulcer of right hip, stage 3: Secondary | ICD-10-CM | POA: Diagnosis not present

## 2014-03-06 DIAGNOSIS — G35 Multiple sclerosis: Secondary | ICD-10-CM | POA: Diagnosis not present

## 2014-03-08 DIAGNOSIS — L89323 Pressure ulcer of left buttock, stage 3: Secondary | ICD-10-CM | POA: Diagnosis not present

## 2014-03-08 DIAGNOSIS — I10 Essential (primary) hypertension: Secondary | ICD-10-CM | POA: Diagnosis not present

## 2014-03-08 DIAGNOSIS — E119 Type 2 diabetes mellitus without complications: Secondary | ICD-10-CM | POA: Diagnosis not present

## 2014-03-08 DIAGNOSIS — M5430 Sciatica, unspecified side: Secondary | ICD-10-CM | POA: Diagnosis not present

## 2014-03-08 DIAGNOSIS — G35 Multiple sclerosis: Secondary | ICD-10-CM | POA: Diagnosis not present

## 2014-03-08 DIAGNOSIS — L89213 Pressure ulcer of right hip, stage 3: Secondary | ICD-10-CM | POA: Diagnosis not present

## 2014-03-10 DIAGNOSIS — L89323 Pressure ulcer of left buttock, stage 3: Secondary | ICD-10-CM | POA: Diagnosis not present

## 2014-03-10 DIAGNOSIS — M5430 Sciatica, unspecified side: Secondary | ICD-10-CM | POA: Diagnosis not present

## 2014-03-10 DIAGNOSIS — E119 Type 2 diabetes mellitus without complications: Secondary | ICD-10-CM | POA: Diagnosis not present

## 2014-03-10 DIAGNOSIS — G35 Multiple sclerosis: Secondary | ICD-10-CM | POA: Diagnosis not present

## 2014-03-10 DIAGNOSIS — L89213 Pressure ulcer of right hip, stage 3: Secondary | ICD-10-CM | POA: Diagnosis not present

## 2014-03-10 DIAGNOSIS — I10 Essential (primary) hypertension: Secondary | ICD-10-CM | POA: Diagnosis not present

## 2014-03-13 DIAGNOSIS — M5430 Sciatica, unspecified side: Secondary | ICD-10-CM | POA: Diagnosis not present

## 2014-03-13 DIAGNOSIS — I10 Essential (primary) hypertension: Secondary | ICD-10-CM | POA: Diagnosis not present

## 2014-03-13 DIAGNOSIS — E119 Type 2 diabetes mellitus without complications: Secondary | ICD-10-CM | POA: Diagnosis not present

## 2014-03-13 DIAGNOSIS — G35 Multiple sclerosis: Secondary | ICD-10-CM | POA: Diagnosis not present

## 2014-03-13 DIAGNOSIS — L89323 Pressure ulcer of left buttock, stage 3: Secondary | ICD-10-CM | POA: Diagnosis not present

## 2014-03-13 DIAGNOSIS — L89213 Pressure ulcer of right hip, stage 3: Secondary | ICD-10-CM | POA: Diagnosis not present

## 2014-03-15 ENCOUNTER — Encounter (HOSPITAL_BASED_OUTPATIENT_CLINIC_OR_DEPARTMENT_OTHER): Payer: Medicare Other | Attending: Internal Medicine

## 2014-03-15 DIAGNOSIS — G35 Multiple sclerosis: Secondary | ICD-10-CM | POA: Insufficient documentation

## 2014-03-15 DIAGNOSIS — L89323 Pressure ulcer of left buttock, stage 3: Secondary | ICD-10-CM | POA: Insufficient documentation

## 2014-03-15 DIAGNOSIS — L89313 Pressure ulcer of right buttock, stage 3: Secondary | ICD-10-CM | POA: Diagnosis not present

## 2014-03-15 DIAGNOSIS — L89211 Pressure ulcer of right hip, stage 1: Secondary | ICD-10-CM | POA: Diagnosis not present

## 2014-03-17 DIAGNOSIS — E119 Type 2 diabetes mellitus without complications: Secondary | ICD-10-CM | POA: Diagnosis not present

## 2014-03-17 DIAGNOSIS — M5430 Sciatica, unspecified side: Secondary | ICD-10-CM | POA: Diagnosis not present

## 2014-03-17 DIAGNOSIS — L89323 Pressure ulcer of left buttock, stage 3: Secondary | ICD-10-CM | POA: Diagnosis not present

## 2014-03-17 DIAGNOSIS — I10 Essential (primary) hypertension: Secondary | ICD-10-CM | POA: Diagnosis not present

## 2014-03-17 DIAGNOSIS — G35 Multiple sclerosis: Secondary | ICD-10-CM | POA: Diagnosis not present

## 2014-03-17 DIAGNOSIS — L89213 Pressure ulcer of right hip, stage 3: Secondary | ICD-10-CM | POA: Diagnosis not present

## 2014-03-20 DIAGNOSIS — L89323 Pressure ulcer of left buttock, stage 3: Secondary | ICD-10-CM | POA: Diagnosis not present

## 2014-03-20 DIAGNOSIS — L89213 Pressure ulcer of right hip, stage 3: Secondary | ICD-10-CM | POA: Diagnosis not present

## 2014-03-20 DIAGNOSIS — M5430 Sciatica, unspecified side: Secondary | ICD-10-CM | POA: Diagnosis not present

## 2014-03-20 DIAGNOSIS — E119 Type 2 diabetes mellitus without complications: Secondary | ICD-10-CM | POA: Diagnosis not present

## 2014-03-20 DIAGNOSIS — G35 Multiple sclerosis: Secondary | ICD-10-CM | POA: Diagnosis not present

## 2014-03-20 DIAGNOSIS — I10 Essential (primary) hypertension: Secondary | ICD-10-CM | POA: Diagnosis not present

## 2014-03-22 DIAGNOSIS — M5430 Sciatica, unspecified side: Secondary | ICD-10-CM | POA: Diagnosis not present

## 2014-03-22 DIAGNOSIS — I10 Essential (primary) hypertension: Secondary | ICD-10-CM | POA: Diagnosis not present

## 2014-03-22 DIAGNOSIS — G35 Multiple sclerosis: Secondary | ICD-10-CM | POA: Diagnosis not present

## 2014-03-22 DIAGNOSIS — E119 Type 2 diabetes mellitus without complications: Secondary | ICD-10-CM | POA: Diagnosis not present

## 2014-03-22 DIAGNOSIS — L89213 Pressure ulcer of right hip, stage 3: Secondary | ICD-10-CM | POA: Diagnosis not present

## 2014-03-22 DIAGNOSIS — L89323 Pressure ulcer of left buttock, stage 3: Secondary | ICD-10-CM | POA: Diagnosis not present

## 2014-03-24 DIAGNOSIS — E119 Type 2 diabetes mellitus without complications: Secondary | ICD-10-CM | POA: Diagnosis not present

## 2014-03-24 DIAGNOSIS — L89213 Pressure ulcer of right hip, stage 3: Secondary | ICD-10-CM | POA: Diagnosis not present

## 2014-03-24 DIAGNOSIS — G35 Multiple sclerosis: Secondary | ICD-10-CM | POA: Diagnosis not present

## 2014-03-24 DIAGNOSIS — L89323 Pressure ulcer of left buttock, stage 3: Secondary | ICD-10-CM | POA: Diagnosis not present

## 2014-03-24 DIAGNOSIS — I10 Essential (primary) hypertension: Secondary | ICD-10-CM | POA: Diagnosis not present

## 2014-03-24 DIAGNOSIS — M5430 Sciatica, unspecified side: Secondary | ICD-10-CM | POA: Diagnosis not present

## 2014-03-26 DIAGNOSIS — L89323 Pressure ulcer of left buttock, stage 3: Secondary | ICD-10-CM | POA: Diagnosis not present

## 2014-03-26 DIAGNOSIS — I10 Essential (primary) hypertension: Secondary | ICD-10-CM | POA: Diagnosis not present

## 2014-03-26 DIAGNOSIS — G35 Multiple sclerosis: Secondary | ICD-10-CM | POA: Diagnosis not present

## 2014-03-26 DIAGNOSIS — M5430 Sciatica, unspecified side: Secondary | ICD-10-CM | POA: Diagnosis not present

## 2014-03-26 DIAGNOSIS — E119 Type 2 diabetes mellitus without complications: Secondary | ICD-10-CM | POA: Diagnosis not present

## 2014-03-26 DIAGNOSIS — L89213 Pressure ulcer of right hip, stage 3: Secondary | ICD-10-CM | POA: Diagnosis not present

## 2014-03-28 DIAGNOSIS — M5430 Sciatica, unspecified side: Secondary | ICD-10-CM | POA: Diagnosis not present

## 2014-03-28 DIAGNOSIS — L89213 Pressure ulcer of right hip, stage 3: Secondary | ICD-10-CM | POA: Diagnosis not present

## 2014-03-28 DIAGNOSIS — E119 Type 2 diabetes mellitus without complications: Secondary | ICD-10-CM | POA: Diagnosis not present

## 2014-03-28 DIAGNOSIS — G35 Multiple sclerosis: Secondary | ICD-10-CM | POA: Diagnosis not present

## 2014-03-28 DIAGNOSIS — L89323 Pressure ulcer of left buttock, stage 3: Secondary | ICD-10-CM | POA: Diagnosis not present

## 2014-03-28 DIAGNOSIS — I10 Essential (primary) hypertension: Secondary | ICD-10-CM | POA: Diagnosis not present

## 2014-03-31 DIAGNOSIS — E119 Type 2 diabetes mellitus without complications: Secondary | ICD-10-CM | POA: Diagnosis not present

## 2014-03-31 DIAGNOSIS — L89213 Pressure ulcer of right hip, stage 3: Secondary | ICD-10-CM | POA: Diagnosis not present

## 2014-03-31 DIAGNOSIS — G35 Multiple sclerosis: Secondary | ICD-10-CM | POA: Diagnosis not present

## 2014-03-31 DIAGNOSIS — I10 Essential (primary) hypertension: Secondary | ICD-10-CM | POA: Diagnosis not present

## 2014-03-31 DIAGNOSIS — L89323 Pressure ulcer of left buttock, stage 3: Secondary | ICD-10-CM | POA: Diagnosis not present

## 2014-03-31 DIAGNOSIS — M5430 Sciatica, unspecified side: Secondary | ICD-10-CM | POA: Diagnosis not present

## 2014-04-03 DIAGNOSIS — G35 Multiple sclerosis: Secondary | ICD-10-CM | POA: Diagnosis not present

## 2014-04-03 DIAGNOSIS — E119 Type 2 diabetes mellitus without complications: Secondary | ICD-10-CM | POA: Diagnosis not present

## 2014-04-03 DIAGNOSIS — L89323 Pressure ulcer of left buttock, stage 3: Secondary | ICD-10-CM | POA: Diagnosis not present

## 2014-04-03 DIAGNOSIS — I10 Essential (primary) hypertension: Secondary | ICD-10-CM | POA: Diagnosis not present

## 2014-04-03 DIAGNOSIS — M5430 Sciatica, unspecified side: Secondary | ICD-10-CM | POA: Diagnosis not present

## 2014-04-03 DIAGNOSIS — L89213 Pressure ulcer of right hip, stage 3: Secondary | ICD-10-CM | POA: Diagnosis not present

## 2014-04-04 DIAGNOSIS — G35 Multiple sclerosis: Secondary | ICD-10-CM | POA: Diagnosis not present

## 2014-04-04 DIAGNOSIS — M5430 Sciatica, unspecified side: Secondary | ICD-10-CM | POA: Diagnosis not present

## 2014-04-04 DIAGNOSIS — L89313 Pressure ulcer of right buttock, stage 3: Secondary | ICD-10-CM | POA: Diagnosis not present

## 2014-04-04 DIAGNOSIS — I10 Essential (primary) hypertension: Secondary | ICD-10-CM | POA: Diagnosis not present

## 2014-04-04 DIAGNOSIS — L89323 Pressure ulcer of left buttock, stage 3: Secondary | ICD-10-CM | POA: Diagnosis not present

## 2014-04-04 DIAGNOSIS — L89213 Pressure ulcer of right hip, stage 3: Secondary | ICD-10-CM | POA: Diagnosis not present

## 2014-04-04 DIAGNOSIS — E119 Type 2 diabetes mellitus without complications: Secondary | ICD-10-CM | POA: Diagnosis not present

## 2014-04-05 ENCOUNTER — Other Ambulatory Visit: Payer: Self-pay | Admitting: Internal Medicine

## 2014-04-05 ENCOUNTER — Encounter (HOSPITAL_BASED_OUTPATIENT_CLINIC_OR_DEPARTMENT_OTHER): Payer: Medicare Other | Attending: Internal Medicine

## 2014-04-05 ENCOUNTER — Ambulatory Visit (HOSPITAL_COMMUNITY)
Admission: RE | Admit: 2014-04-05 | Discharge: 2014-04-05 | Disposition: A | Discharge status: Home / Self Care | Payer: Medicare Other | Source: Ambulatory Visit | Attending: Internal Medicine | Admitting: Internal Medicine

## 2014-04-05 DIAGNOSIS — M869 Osteomyelitis, unspecified: Secondary | ICD-10-CM

## 2014-04-05 DIAGNOSIS — S31000D Unspecified open wound of lower back and pelvis without penetration into retroperitoneum, subsequent encounter: Secondary | ICD-10-CM | POA: Insufficient documentation

## 2014-04-05 DIAGNOSIS — L89313 Pressure ulcer of right buttock, stage 3: Secondary | ICD-10-CM | POA: Diagnosis not present

## 2014-04-05 DIAGNOSIS — L89323 Pressure ulcer of left buttock, stage 3: Secondary | ICD-10-CM | POA: Diagnosis not present

## 2014-04-05 DIAGNOSIS — M5136 Other intervertebral disc degeneration, lumbar region: Secondary | ICD-10-CM | POA: Diagnosis not present

## 2014-04-05 DIAGNOSIS — L89214 Pressure ulcer of right hip, stage 4: Secondary | ICD-10-CM | POA: Insufficient documentation

## 2014-04-05 DIAGNOSIS — S31000A Unspecified open wound of lower back and pelvis without penetration into retroperitoneum, initial encounter: Secondary | ICD-10-CM | POA: Diagnosis not present

## 2014-04-05 DIAGNOSIS — L89314 Pressure ulcer of right buttock, stage 4: Secondary | ICD-10-CM | POA: Diagnosis not present

## 2014-04-05 DIAGNOSIS — L89324 Pressure ulcer of left buttock, stage 4: Secondary | ICD-10-CM | POA: Insufficient documentation

## 2014-04-07 DIAGNOSIS — L89323 Pressure ulcer of left buttock, stage 3: Secondary | ICD-10-CM | POA: Diagnosis not present

## 2014-04-07 DIAGNOSIS — L89313 Pressure ulcer of right buttock, stage 3: Secondary | ICD-10-CM | POA: Diagnosis not present

## 2014-04-07 DIAGNOSIS — M5430 Sciatica, unspecified side: Secondary | ICD-10-CM | POA: Diagnosis not present

## 2014-04-07 DIAGNOSIS — E119 Type 2 diabetes mellitus without complications: Secondary | ICD-10-CM | POA: Diagnosis not present

## 2014-04-07 DIAGNOSIS — L89213 Pressure ulcer of right hip, stage 3: Secondary | ICD-10-CM | POA: Diagnosis not present

## 2014-04-07 DIAGNOSIS — G35 Multiple sclerosis: Secondary | ICD-10-CM | POA: Diagnosis not present

## 2014-04-10 DIAGNOSIS — G35 Multiple sclerosis: Secondary | ICD-10-CM | POA: Diagnosis not present

## 2014-04-10 DIAGNOSIS — L89313 Pressure ulcer of right buttock, stage 3: Secondary | ICD-10-CM | POA: Diagnosis not present

## 2014-04-10 DIAGNOSIS — E119 Type 2 diabetes mellitus without complications: Secondary | ICD-10-CM | POA: Diagnosis not present

## 2014-04-10 DIAGNOSIS — L89213 Pressure ulcer of right hip, stage 3: Secondary | ICD-10-CM | POA: Diagnosis not present

## 2014-04-10 DIAGNOSIS — L89323 Pressure ulcer of left buttock, stage 3: Secondary | ICD-10-CM | POA: Diagnosis not present

## 2014-04-10 DIAGNOSIS — M5430 Sciatica, unspecified side: Secondary | ICD-10-CM | POA: Diagnosis not present

## 2014-04-12 ENCOUNTER — Other Ambulatory Visit: Payer: Self-pay | Admitting: Internal Medicine

## 2014-04-12 DIAGNOSIS — L89324 Pressure ulcer of left buttock, stage 4: Secondary | ICD-10-CM | POA: Diagnosis not present

## 2014-04-12 DIAGNOSIS — L89214 Pressure ulcer of right hip, stage 4: Secondary | ICD-10-CM | POA: Diagnosis not present

## 2014-04-12 DIAGNOSIS — M1610 Unilateral primary osteoarthritis, unspecified hip: Secondary | ICD-10-CM

## 2014-04-12 DIAGNOSIS — L89314 Pressure ulcer of right buttock, stage 4: Secondary | ICD-10-CM | POA: Diagnosis not present

## 2014-04-14 DIAGNOSIS — M5430 Sciatica, unspecified side: Secondary | ICD-10-CM | POA: Diagnosis not present

## 2014-04-14 DIAGNOSIS — L89313 Pressure ulcer of right buttock, stage 3: Secondary | ICD-10-CM | POA: Diagnosis not present

## 2014-04-14 DIAGNOSIS — L89323 Pressure ulcer of left buttock, stage 3: Secondary | ICD-10-CM | POA: Diagnosis not present

## 2014-04-14 DIAGNOSIS — G35 Multiple sclerosis: Secondary | ICD-10-CM | POA: Diagnosis not present

## 2014-04-14 DIAGNOSIS — L89213 Pressure ulcer of right hip, stage 3: Secondary | ICD-10-CM | POA: Diagnosis not present

## 2014-04-14 DIAGNOSIS — E119 Type 2 diabetes mellitus without complications: Secondary | ICD-10-CM | POA: Diagnosis not present

## 2014-04-17 DIAGNOSIS — G35 Multiple sclerosis: Secondary | ICD-10-CM | POA: Diagnosis not present

## 2014-04-17 DIAGNOSIS — M5430 Sciatica, unspecified side: Secondary | ICD-10-CM | POA: Diagnosis not present

## 2014-04-17 DIAGNOSIS — L89212 Pressure ulcer of right hip, stage 2: Secondary | ICD-10-CM | POA: Diagnosis not present

## 2014-04-17 DIAGNOSIS — L89323 Pressure ulcer of left buttock, stage 3: Secondary | ICD-10-CM | POA: Diagnosis not present

## 2014-04-17 DIAGNOSIS — L89313 Pressure ulcer of right buttock, stage 3: Secondary | ICD-10-CM | POA: Diagnosis not present

## 2014-04-17 DIAGNOSIS — L89213 Pressure ulcer of right hip, stage 3: Secondary | ICD-10-CM | POA: Diagnosis not present

## 2014-04-17 DIAGNOSIS — E119 Type 2 diabetes mellitus without complications: Secondary | ICD-10-CM | POA: Diagnosis not present

## 2014-04-19 DIAGNOSIS — L89323 Pressure ulcer of left buttock, stage 3: Secondary | ICD-10-CM | POA: Diagnosis not present

## 2014-04-19 DIAGNOSIS — G35 Multiple sclerosis: Secondary | ICD-10-CM | POA: Diagnosis not present

## 2014-04-19 DIAGNOSIS — L89313 Pressure ulcer of right buttock, stage 3: Secondary | ICD-10-CM | POA: Diagnosis not present

## 2014-04-19 DIAGNOSIS — E119 Type 2 diabetes mellitus without complications: Secondary | ICD-10-CM | POA: Diagnosis not present

## 2014-04-19 DIAGNOSIS — L89213 Pressure ulcer of right hip, stage 3: Secondary | ICD-10-CM | POA: Diagnosis not present

## 2014-04-19 DIAGNOSIS — M5430 Sciatica, unspecified side: Secondary | ICD-10-CM | POA: Diagnosis not present

## 2014-04-21 DIAGNOSIS — G35 Multiple sclerosis: Secondary | ICD-10-CM | POA: Diagnosis not present

## 2014-04-21 DIAGNOSIS — L89213 Pressure ulcer of right hip, stage 3: Secondary | ICD-10-CM | POA: Diagnosis not present

## 2014-04-21 DIAGNOSIS — L89323 Pressure ulcer of left buttock, stage 3: Secondary | ICD-10-CM | POA: Diagnosis not present

## 2014-04-21 DIAGNOSIS — L89313 Pressure ulcer of right buttock, stage 3: Secondary | ICD-10-CM | POA: Diagnosis not present

## 2014-04-21 DIAGNOSIS — E119 Type 2 diabetes mellitus without complications: Secondary | ICD-10-CM | POA: Diagnosis not present

## 2014-04-21 DIAGNOSIS — M5430 Sciatica, unspecified side: Secondary | ICD-10-CM | POA: Diagnosis not present

## 2014-04-24 DIAGNOSIS — G35 Multiple sclerosis: Secondary | ICD-10-CM | POA: Diagnosis not present

## 2014-04-24 DIAGNOSIS — L89213 Pressure ulcer of right hip, stage 3: Secondary | ICD-10-CM | POA: Diagnosis not present

## 2014-04-24 DIAGNOSIS — L89313 Pressure ulcer of right buttock, stage 3: Secondary | ICD-10-CM | POA: Diagnosis not present

## 2014-04-24 DIAGNOSIS — E119 Type 2 diabetes mellitus without complications: Secondary | ICD-10-CM | POA: Diagnosis not present

## 2014-04-24 DIAGNOSIS — M5430 Sciatica, unspecified side: Secondary | ICD-10-CM | POA: Diagnosis not present

## 2014-04-24 DIAGNOSIS — L89323 Pressure ulcer of left buttock, stage 3: Secondary | ICD-10-CM | POA: Diagnosis not present

## 2014-04-25 ENCOUNTER — Other Ambulatory Visit: Payer: Self-pay | Admitting: Internal Medicine

## 2014-04-25 ENCOUNTER — Ambulatory Visit (HOSPITAL_COMMUNITY)
Admission: RE | Admit: 2014-04-25 | Discharge: 2014-04-25 | Disposition: A | Discharge status: Home / Self Care | Payer: Medicare Other | Source: Ambulatory Visit | Attending: Internal Medicine | Admitting: Internal Medicine

## 2014-04-25 DIAGNOSIS — M1610 Unilateral primary osteoarthritis, unspecified hip: Secondary | ICD-10-CM

## 2014-04-25 DIAGNOSIS — M169 Osteoarthritis of hip, unspecified: Secondary | ICD-10-CM | POA: Diagnosis not present

## 2014-04-25 DIAGNOSIS — S31000A Unspecified open wound of lower back and pelvis without penetration into retroperitoneum, initial encounter: Secondary | ICD-10-CM | POA: Diagnosis not present

## 2014-04-25 DIAGNOSIS — N289 Disorder of kidney and ureter, unspecified: Secondary | ICD-10-CM | POA: Insufficient documentation

## 2014-04-26 DIAGNOSIS — L89314 Pressure ulcer of right buttock, stage 4: Secondary | ICD-10-CM | POA: Diagnosis not present

## 2014-04-26 DIAGNOSIS — L89324 Pressure ulcer of left buttock, stage 4: Secondary | ICD-10-CM | POA: Diagnosis not present

## 2014-04-26 DIAGNOSIS — L89214 Pressure ulcer of right hip, stage 4: Secondary | ICD-10-CM | POA: Diagnosis not present

## 2014-04-28 DIAGNOSIS — L89213 Pressure ulcer of right hip, stage 3: Secondary | ICD-10-CM | POA: Diagnosis not present

## 2014-04-28 DIAGNOSIS — L89323 Pressure ulcer of left buttock, stage 3: Secondary | ICD-10-CM | POA: Diagnosis not present

## 2014-04-28 DIAGNOSIS — E119 Type 2 diabetes mellitus without complications: Secondary | ICD-10-CM | POA: Diagnosis not present

## 2014-04-28 DIAGNOSIS — M5430 Sciatica, unspecified side: Secondary | ICD-10-CM | POA: Diagnosis not present

## 2014-04-28 DIAGNOSIS — G35 Multiple sclerosis: Secondary | ICD-10-CM | POA: Diagnosis not present

## 2014-04-28 DIAGNOSIS — L89313 Pressure ulcer of right buttock, stage 3: Secondary | ICD-10-CM | POA: Diagnosis not present

## 2014-05-01 DIAGNOSIS — E119 Type 2 diabetes mellitus without complications: Secondary | ICD-10-CM | POA: Diagnosis not present

## 2014-05-01 DIAGNOSIS — L89323 Pressure ulcer of left buttock, stage 3: Secondary | ICD-10-CM | POA: Diagnosis not present

## 2014-05-01 DIAGNOSIS — L89213 Pressure ulcer of right hip, stage 3: Secondary | ICD-10-CM | POA: Diagnosis not present

## 2014-05-01 DIAGNOSIS — G35 Multiple sclerosis: Secondary | ICD-10-CM | POA: Diagnosis not present

## 2014-05-01 DIAGNOSIS — M5430 Sciatica, unspecified side: Secondary | ICD-10-CM | POA: Diagnosis not present

## 2014-05-01 DIAGNOSIS — L89313 Pressure ulcer of right buttock, stage 3: Secondary | ICD-10-CM | POA: Diagnosis not present

## 2014-05-02 DIAGNOSIS — N183 Chronic kidney disease, stage 3 (moderate): Secondary | ICD-10-CM | POA: Diagnosis not present

## 2014-05-02 DIAGNOSIS — D649 Anemia, unspecified: Secondary | ICD-10-CM | POA: Diagnosis not present

## 2014-05-02 DIAGNOSIS — I10 Essential (primary) hypertension: Secondary | ICD-10-CM | POA: Diagnosis not present

## 2014-05-03 DIAGNOSIS — M5430 Sciatica, unspecified side: Secondary | ICD-10-CM | POA: Diagnosis not present

## 2014-05-03 DIAGNOSIS — L89313 Pressure ulcer of right buttock, stage 3: Secondary | ICD-10-CM | POA: Diagnosis not present

## 2014-05-03 DIAGNOSIS — L89213 Pressure ulcer of right hip, stage 3: Secondary | ICD-10-CM | POA: Diagnosis not present

## 2014-05-03 DIAGNOSIS — L89323 Pressure ulcer of left buttock, stage 3: Secondary | ICD-10-CM | POA: Diagnosis not present

## 2014-05-03 DIAGNOSIS — N183 Chronic kidney disease, stage 3 (moderate): Secondary | ICD-10-CM | POA: Diagnosis not present

## 2014-05-03 DIAGNOSIS — E119 Type 2 diabetes mellitus without complications: Secondary | ICD-10-CM | POA: Diagnosis not present

## 2014-05-03 DIAGNOSIS — G35 Multiple sclerosis: Secondary | ICD-10-CM | POA: Diagnosis not present

## 2014-05-03 DIAGNOSIS — R8290 Unspecified abnormal findings in urine: Secondary | ICD-10-CM | POA: Diagnosis not present

## 2014-05-05 DIAGNOSIS — M5430 Sciatica, unspecified side: Secondary | ICD-10-CM | POA: Diagnosis not present

## 2014-05-05 DIAGNOSIS — L89323 Pressure ulcer of left buttock, stage 3: Secondary | ICD-10-CM | POA: Diagnosis not present

## 2014-05-05 DIAGNOSIS — E119 Type 2 diabetes mellitus without complications: Secondary | ICD-10-CM | POA: Diagnosis not present

## 2014-05-05 DIAGNOSIS — G35 Multiple sclerosis: Secondary | ICD-10-CM | POA: Diagnosis not present

## 2014-05-05 DIAGNOSIS — L89313 Pressure ulcer of right buttock, stage 3: Secondary | ICD-10-CM | POA: Diagnosis not present

## 2014-05-05 DIAGNOSIS — L89213 Pressure ulcer of right hip, stage 3: Secondary | ICD-10-CM | POA: Diagnosis not present

## 2014-05-09 DIAGNOSIS — L89213 Pressure ulcer of right hip, stage 3: Secondary | ICD-10-CM | POA: Diagnosis not present

## 2014-05-09 DIAGNOSIS — L89323 Pressure ulcer of left buttock, stage 3: Secondary | ICD-10-CM | POA: Diagnosis not present

## 2014-05-09 DIAGNOSIS — E119 Type 2 diabetes mellitus without complications: Secondary | ICD-10-CM | POA: Diagnosis not present

## 2014-05-09 DIAGNOSIS — G35 Multiple sclerosis: Secondary | ICD-10-CM | POA: Diagnosis not present

## 2014-05-09 DIAGNOSIS — L89313 Pressure ulcer of right buttock, stage 3: Secondary | ICD-10-CM | POA: Diagnosis not present

## 2014-05-09 DIAGNOSIS — M5430 Sciatica, unspecified side: Secondary | ICD-10-CM | POA: Diagnosis not present

## 2014-05-10 ENCOUNTER — Encounter (HOSPITAL_BASED_OUTPATIENT_CLINIC_OR_DEPARTMENT_OTHER): Payer: Medicare Other | Attending: Internal Medicine

## 2014-05-10 DIAGNOSIS — L89314 Pressure ulcer of right buttock, stage 4: Secondary | ICD-10-CM | POA: Diagnosis not present

## 2014-05-10 DIAGNOSIS — L89324 Pressure ulcer of left buttock, stage 4: Secondary | ICD-10-CM | POA: Diagnosis not present

## 2014-05-10 DIAGNOSIS — L89891 Pressure ulcer of other site, stage 1: Secondary | ICD-10-CM | POA: Diagnosis not present

## 2014-05-12 DIAGNOSIS — L89323 Pressure ulcer of left buttock, stage 3: Secondary | ICD-10-CM | POA: Diagnosis not present

## 2014-05-12 DIAGNOSIS — L89313 Pressure ulcer of right buttock, stage 3: Secondary | ICD-10-CM | POA: Diagnosis not present

## 2014-05-12 DIAGNOSIS — E119 Type 2 diabetes mellitus without complications: Secondary | ICD-10-CM | POA: Diagnosis not present

## 2014-05-12 DIAGNOSIS — M5430 Sciatica, unspecified side: Secondary | ICD-10-CM | POA: Diagnosis not present

## 2014-05-12 DIAGNOSIS — L89213 Pressure ulcer of right hip, stage 3: Secondary | ICD-10-CM | POA: Diagnosis not present

## 2014-05-12 DIAGNOSIS — G35 Multiple sclerosis: Secondary | ICD-10-CM | POA: Diagnosis not present

## 2014-05-15 DIAGNOSIS — L89313 Pressure ulcer of right buttock, stage 3: Secondary | ICD-10-CM | POA: Diagnosis not present

## 2014-05-15 DIAGNOSIS — L89323 Pressure ulcer of left buttock, stage 3: Secondary | ICD-10-CM | POA: Diagnosis not present

## 2014-05-15 DIAGNOSIS — E119 Type 2 diabetes mellitus without complications: Secondary | ICD-10-CM | POA: Diagnosis not present

## 2014-05-15 DIAGNOSIS — G35 Multiple sclerosis: Secondary | ICD-10-CM | POA: Diagnosis not present

## 2014-05-15 DIAGNOSIS — L89213 Pressure ulcer of right hip, stage 3: Secondary | ICD-10-CM | POA: Diagnosis not present

## 2014-05-15 DIAGNOSIS — M5430 Sciatica, unspecified side: Secondary | ICD-10-CM | POA: Diagnosis not present

## 2014-05-17 DIAGNOSIS — E119 Type 2 diabetes mellitus without complications: Secondary | ICD-10-CM | POA: Diagnosis not present

## 2014-05-17 DIAGNOSIS — G35 Multiple sclerosis: Secondary | ICD-10-CM | POA: Diagnosis not present

## 2014-05-17 DIAGNOSIS — L89323 Pressure ulcer of left buttock, stage 3: Secondary | ICD-10-CM | POA: Diagnosis not present

## 2014-05-17 DIAGNOSIS — L89313 Pressure ulcer of right buttock, stage 3: Secondary | ICD-10-CM | POA: Diagnosis not present

## 2014-05-17 DIAGNOSIS — M5430 Sciatica, unspecified side: Secondary | ICD-10-CM | POA: Diagnosis not present

## 2014-05-17 DIAGNOSIS — L89213 Pressure ulcer of right hip, stage 3: Secondary | ICD-10-CM | POA: Diagnosis not present

## 2014-05-19 DIAGNOSIS — M5430 Sciatica, unspecified side: Secondary | ICD-10-CM | POA: Diagnosis not present

## 2014-05-19 DIAGNOSIS — E119 Type 2 diabetes mellitus without complications: Secondary | ICD-10-CM | POA: Diagnosis not present

## 2014-05-19 DIAGNOSIS — L89323 Pressure ulcer of left buttock, stage 3: Secondary | ICD-10-CM | POA: Diagnosis not present

## 2014-05-19 DIAGNOSIS — L89213 Pressure ulcer of right hip, stage 3: Secondary | ICD-10-CM | POA: Diagnosis not present

## 2014-05-19 DIAGNOSIS — L89313 Pressure ulcer of right buttock, stage 3: Secondary | ICD-10-CM | POA: Diagnosis not present

## 2014-05-19 DIAGNOSIS — G35 Multiple sclerosis: Secondary | ICD-10-CM | POA: Diagnosis not present

## 2014-05-22 DIAGNOSIS — M5430 Sciatica, unspecified side: Secondary | ICD-10-CM | POA: Diagnosis not present

## 2014-05-22 DIAGNOSIS — L89313 Pressure ulcer of right buttock, stage 3: Secondary | ICD-10-CM | POA: Diagnosis not present

## 2014-05-22 DIAGNOSIS — G35 Multiple sclerosis: Secondary | ICD-10-CM | POA: Diagnosis not present

## 2014-05-22 DIAGNOSIS — L89323 Pressure ulcer of left buttock, stage 3: Secondary | ICD-10-CM | POA: Diagnosis not present

## 2014-05-22 DIAGNOSIS — E119 Type 2 diabetes mellitus without complications: Secondary | ICD-10-CM | POA: Diagnosis not present

## 2014-05-22 DIAGNOSIS — L89213 Pressure ulcer of right hip, stage 3: Secondary | ICD-10-CM | POA: Diagnosis not present

## 2014-05-24 DIAGNOSIS — L89314 Pressure ulcer of right buttock, stage 4: Secondary | ICD-10-CM | POA: Diagnosis not present

## 2014-05-24 DIAGNOSIS — L89891 Pressure ulcer of other site, stage 1: Secondary | ICD-10-CM | POA: Diagnosis not present

## 2014-05-24 DIAGNOSIS — L89324 Pressure ulcer of left buttock, stage 4: Secondary | ICD-10-CM | POA: Diagnosis not present

## 2014-05-29 DIAGNOSIS — L89313 Pressure ulcer of right buttock, stage 3: Secondary | ICD-10-CM | POA: Diagnosis not present

## 2014-05-29 DIAGNOSIS — E119 Type 2 diabetes mellitus without complications: Secondary | ICD-10-CM | POA: Diagnosis not present

## 2014-05-29 DIAGNOSIS — L89213 Pressure ulcer of right hip, stage 3: Secondary | ICD-10-CM | POA: Diagnosis not present

## 2014-05-29 DIAGNOSIS — G35 Multiple sclerosis: Secondary | ICD-10-CM | POA: Diagnosis not present

## 2014-05-29 DIAGNOSIS — M5430 Sciatica, unspecified side: Secondary | ICD-10-CM | POA: Diagnosis not present

## 2014-05-29 DIAGNOSIS — L89323 Pressure ulcer of left buttock, stage 3: Secondary | ICD-10-CM | POA: Diagnosis not present

## 2014-06-01 DIAGNOSIS — D649 Anemia, unspecified: Secondary | ICD-10-CM | POA: Diagnosis not present

## 2014-06-01 DIAGNOSIS — N183 Chronic kidney disease, stage 3 (moderate): Secondary | ICD-10-CM | POA: Diagnosis not present

## 2014-06-01 DIAGNOSIS — E1121 Type 2 diabetes mellitus with diabetic nephropathy: Secondary | ICD-10-CM | POA: Diagnosis not present

## 2014-06-01 DIAGNOSIS — I129 Hypertensive chronic kidney disease with stage 1 through stage 4 chronic kidney disease, or unspecified chronic kidney disease: Secondary | ICD-10-CM | POA: Diagnosis not present

## 2014-06-01 DIAGNOSIS — E119 Type 2 diabetes mellitus without complications: Secondary | ICD-10-CM | POA: Diagnosis not present

## 2014-06-01 DIAGNOSIS — G35 Multiple sclerosis: Secondary | ICD-10-CM | POA: Diagnosis not present

## 2014-06-02 DIAGNOSIS — M5430 Sciatica, unspecified side: Secondary | ICD-10-CM | POA: Diagnosis not present

## 2014-06-02 DIAGNOSIS — E119 Type 2 diabetes mellitus without complications: Secondary | ICD-10-CM | POA: Diagnosis not present

## 2014-06-02 DIAGNOSIS — L89323 Pressure ulcer of left buttock, stage 3: Secondary | ICD-10-CM | POA: Diagnosis not present

## 2014-06-02 DIAGNOSIS — G35 Multiple sclerosis: Secondary | ICD-10-CM | POA: Diagnosis not present

## 2014-06-02 DIAGNOSIS — L89213 Pressure ulcer of right hip, stage 3: Secondary | ICD-10-CM | POA: Diagnosis not present

## 2014-06-02 DIAGNOSIS — L89313 Pressure ulcer of right buttock, stage 3: Secondary | ICD-10-CM | POA: Diagnosis not present

## 2014-06-03 DIAGNOSIS — L89313 Pressure ulcer of right buttock, stage 3: Secondary | ICD-10-CM | POA: Diagnosis not present

## 2014-06-03 DIAGNOSIS — L89213 Pressure ulcer of right hip, stage 3: Secondary | ICD-10-CM | POA: Diagnosis not present

## 2014-06-03 DIAGNOSIS — M5430 Sciatica, unspecified side: Secondary | ICD-10-CM | POA: Diagnosis not present

## 2014-06-03 DIAGNOSIS — L89323 Pressure ulcer of left buttock, stage 3: Secondary | ICD-10-CM | POA: Diagnosis not present

## 2014-06-03 DIAGNOSIS — G35 Multiple sclerosis: Secondary | ICD-10-CM | POA: Diagnosis not present

## 2014-06-03 DIAGNOSIS — Z4800 Encounter for change or removal of nonsurgical wound dressing: Secondary | ICD-10-CM | POA: Diagnosis not present

## 2014-06-03 DIAGNOSIS — I10 Essential (primary) hypertension: Secondary | ICD-10-CM | POA: Diagnosis not present

## 2014-06-03 DIAGNOSIS — E119 Type 2 diabetes mellitus without complications: Secondary | ICD-10-CM | POA: Diagnosis not present

## 2014-06-05 DIAGNOSIS — E119 Type 2 diabetes mellitus without complications: Secondary | ICD-10-CM | POA: Diagnosis not present

## 2014-06-05 DIAGNOSIS — M5430 Sciatica, unspecified side: Secondary | ICD-10-CM | POA: Diagnosis not present

## 2014-06-05 DIAGNOSIS — I10 Essential (primary) hypertension: Secondary | ICD-10-CM | POA: Diagnosis not present

## 2014-06-05 DIAGNOSIS — L89313 Pressure ulcer of right buttock, stage 3: Secondary | ICD-10-CM | POA: Diagnosis not present

## 2014-06-05 DIAGNOSIS — G35 Multiple sclerosis: Secondary | ICD-10-CM | POA: Diagnosis not present

## 2014-06-05 DIAGNOSIS — L89323 Pressure ulcer of left buttock, stage 3: Secondary | ICD-10-CM | POA: Diagnosis not present

## 2014-06-07 ENCOUNTER — Encounter (HOSPITAL_BASED_OUTPATIENT_CLINIC_OR_DEPARTMENT_OTHER): Payer: Medicare Other | Attending: Internal Medicine

## 2014-06-07 DIAGNOSIS — I10 Essential (primary) hypertension: Secondary | ICD-10-CM | POA: Diagnosis not present

## 2014-06-07 DIAGNOSIS — L89323 Pressure ulcer of left buttock, stage 3: Secondary | ICD-10-CM | POA: Insufficient documentation

## 2014-06-07 DIAGNOSIS — L89313 Pressure ulcer of right buttock, stage 3: Secondary | ICD-10-CM | POA: Diagnosis not present

## 2014-06-07 DIAGNOSIS — E119 Type 2 diabetes mellitus without complications: Secondary | ICD-10-CM | POA: Diagnosis not present

## 2014-06-07 DIAGNOSIS — M5430 Sciatica, unspecified side: Secondary | ICD-10-CM | POA: Diagnosis not present

## 2014-06-07 DIAGNOSIS — G35 Multiple sclerosis: Secondary | ICD-10-CM | POA: Diagnosis not present

## 2014-06-09 DIAGNOSIS — I10 Essential (primary) hypertension: Secondary | ICD-10-CM | POA: Diagnosis not present

## 2014-06-09 DIAGNOSIS — M5430 Sciatica, unspecified side: Secondary | ICD-10-CM | POA: Diagnosis not present

## 2014-06-09 DIAGNOSIS — E119 Type 2 diabetes mellitus without complications: Secondary | ICD-10-CM | POA: Diagnosis not present

## 2014-06-09 DIAGNOSIS — L89323 Pressure ulcer of left buttock, stage 3: Secondary | ICD-10-CM | POA: Diagnosis not present

## 2014-06-09 DIAGNOSIS — L89313 Pressure ulcer of right buttock, stage 3: Secondary | ICD-10-CM | POA: Diagnosis not present

## 2014-06-09 DIAGNOSIS — G35 Multiple sclerosis: Secondary | ICD-10-CM | POA: Diagnosis not present

## 2014-06-12 DIAGNOSIS — L89323 Pressure ulcer of left buttock, stage 3: Secondary | ICD-10-CM | POA: Diagnosis not present

## 2014-06-12 DIAGNOSIS — L89313 Pressure ulcer of right buttock, stage 3: Secondary | ICD-10-CM | POA: Diagnosis not present

## 2014-06-12 DIAGNOSIS — I10 Essential (primary) hypertension: Secondary | ICD-10-CM | POA: Diagnosis not present

## 2014-06-12 DIAGNOSIS — G35 Multiple sclerosis: Secondary | ICD-10-CM | POA: Diagnosis not present

## 2014-06-12 DIAGNOSIS — E119 Type 2 diabetes mellitus without complications: Secondary | ICD-10-CM | POA: Diagnosis not present

## 2014-06-12 DIAGNOSIS — M5430 Sciatica, unspecified side: Secondary | ICD-10-CM | POA: Diagnosis not present

## 2014-06-14 DIAGNOSIS — I10 Essential (primary) hypertension: Secondary | ICD-10-CM | POA: Diagnosis not present

## 2014-06-14 DIAGNOSIS — L89323 Pressure ulcer of left buttock, stage 3: Secondary | ICD-10-CM | POA: Diagnosis not present

## 2014-06-14 DIAGNOSIS — L89313 Pressure ulcer of right buttock, stage 3: Secondary | ICD-10-CM | POA: Diagnosis not present

## 2014-06-14 DIAGNOSIS — M5430 Sciatica, unspecified side: Secondary | ICD-10-CM | POA: Diagnosis not present

## 2014-06-14 DIAGNOSIS — G35 Multiple sclerosis: Secondary | ICD-10-CM | POA: Diagnosis not present

## 2014-06-14 DIAGNOSIS — E119 Type 2 diabetes mellitus without complications: Secondary | ICD-10-CM | POA: Diagnosis not present

## 2014-06-16 DIAGNOSIS — E119 Type 2 diabetes mellitus without complications: Secondary | ICD-10-CM | POA: Diagnosis not present

## 2014-06-16 DIAGNOSIS — M5430 Sciatica, unspecified side: Secondary | ICD-10-CM | POA: Diagnosis not present

## 2014-06-16 DIAGNOSIS — L89323 Pressure ulcer of left buttock, stage 3: Secondary | ICD-10-CM | POA: Diagnosis not present

## 2014-06-16 DIAGNOSIS — I10 Essential (primary) hypertension: Secondary | ICD-10-CM | POA: Diagnosis not present

## 2014-06-16 DIAGNOSIS — L89313 Pressure ulcer of right buttock, stage 3: Secondary | ICD-10-CM | POA: Diagnosis not present

## 2014-06-16 DIAGNOSIS — G35 Multiple sclerosis: Secondary | ICD-10-CM | POA: Diagnosis not present

## 2014-06-19 DIAGNOSIS — E119 Type 2 diabetes mellitus without complications: Secondary | ICD-10-CM | POA: Diagnosis not present

## 2014-06-19 DIAGNOSIS — G35 Multiple sclerosis: Secondary | ICD-10-CM | POA: Diagnosis not present

## 2014-06-19 DIAGNOSIS — L89323 Pressure ulcer of left buttock, stage 3: Secondary | ICD-10-CM | POA: Diagnosis not present

## 2014-06-19 DIAGNOSIS — M5430 Sciatica, unspecified side: Secondary | ICD-10-CM | POA: Diagnosis not present

## 2014-06-19 DIAGNOSIS — Z5181 Encounter for therapeutic drug level monitoring: Secondary | ICD-10-CM | POA: Diagnosis not present

## 2014-06-19 DIAGNOSIS — L89313 Pressure ulcer of right buttock, stage 3: Secondary | ICD-10-CM | POA: Diagnosis not present

## 2014-06-19 DIAGNOSIS — I10 Essential (primary) hypertension: Secondary | ICD-10-CM | POA: Diagnosis not present

## 2014-06-20 DIAGNOSIS — I10 Essential (primary) hypertension: Secondary | ICD-10-CM | POA: Diagnosis not present

## 2014-06-20 DIAGNOSIS — G35 Multiple sclerosis: Secondary | ICD-10-CM | POA: Diagnosis not present

## 2014-06-20 DIAGNOSIS — E119 Type 2 diabetes mellitus without complications: Secondary | ICD-10-CM | POA: Diagnosis not present

## 2014-06-20 DIAGNOSIS — L89313 Pressure ulcer of right buttock, stage 3: Secondary | ICD-10-CM | POA: Diagnosis not present

## 2014-06-20 DIAGNOSIS — M5430 Sciatica, unspecified side: Secondary | ICD-10-CM | POA: Diagnosis not present

## 2014-06-20 DIAGNOSIS — L89323 Pressure ulcer of left buttock, stage 3: Secondary | ICD-10-CM | POA: Diagnosis not present

## 2014-06-21 DIAGNOSIS — I10 Essential (primary) hypertension: Secondary | ICD-10-CM | POA: Diagnosis not present

## 2014-06-21 DIAGNOSIS — L89323 Pressure ulcer of left buttock, stage 3: Secondary | ICD-10-CM | POA: Diagnosis not present

## 2014-06-21 DIAGNOSIS — G35 Multiple sclerosis: Secondary | ICD-10-CM | POA: Diagnosis not present

## 2014-06-21 DIAGNOSIS — L89313 Pressure ulcer of right buttock, stage 3: Secondary | ICD-10-CM | POA: Diagnosis not present

## 2014-06-21 DIAGNOSIS — M5430 Sciatica, unspecified side: Secondary | ICD-10-CM | POA: Diagnosis not present

## 2014-06-21 DIAGNOSIS — E119 Type 2 diabetes mellitus without complications: Secondary | ICD-10-CM | POA: Diagnosis not present

## 2014-06-23 DIAGNOSIS — M5430 Sciatica, unspecified side: Secondary | ICD-10-CM | POA: Diagnosis not present

## 2014-06-23 DIAGNOSIS — E119 Type 2 diabetes mellitus without complications: Secondary | ICD-10-CM | POA: Diagnosis not present

## 2014-06-23 DIAGNOSIS — I10 Essential (primary) hypertension: Secondary | ICD-10-CM | POA: Diagnosis not present

## 2014-06-23 DIAGNOSIS — G35 Multiple sclerosis: Secondary | ICD-10-CM | POA: Diagnosis not present

## 2014-06-23 DIAGNOSIS — L89313 Pressure ulcer of right buttock, stage 3: Secondary | ICD-10-CM | POA: Diagnosis not present

## 2014-06-23 DIAGNOSIS — L89323 Pressure ulcer of left buttock, stage 3: Secondary | ICD-10-CM | POA: Diagnosis not present

## 2014-06-25 DIAGNOSIS — G35 Multiple sclerosis: Secondary | ICD-10-CM | POA: Diagnosis not present

## 2014-06-25 DIAGNOSIS — M5430 Sciatica, unspecified side: Secondary | ICD-10-CM | POA: Diagnosis not present

## 2014-06-25 DIAGNOSIS — I10 Essential (primary) hypertension: Secondary | ICD-10-CM | POA: Diagnosis not present

## 2014-06-25 DIAGNOSIS — L89313 Pressure ulcer of right buttock, stage 3: Secondary | ICD-10-CM | POA: Diagnosis not present

## 2014-06-25 DIAGNOSIS — L89323 Pressure ulcer of left buttock, stage 3: Secondary | ICD-10-CM | POA: Diagnosis not present

## 2014-06-25 DIAGNOSIS — E119 Type 2 diabetes mellitus without complications: Secondary | ICD-10-CM | POA: Diagnosis not present

## 2014-06-26 DIAGNOSIS — L89313 Pressure ulcer of right buttock, stage 3: Secondary | ICD-10-CM | POA: Diagnosis not present

## 2014-06-26 DIAGNOSIS — E119 Type 2 diabetes mellitus without complications: Secondary | ICD-10-CM | POA: Diagnosis not present

## 2014-06-26 DIAGNOSIS — I10 Essential (primary) hypertension: Secondary | ICD-10-CM | POA: Diagnosis not present

## 2014-06-26 DIAGNOSIS — L89323 Pressure ulcer of left buttock, stage 3: Secondary | ICD-10-CM | POA: Diagnosis not present

## 2014-06-26 DIAGNOSIS — G35 Multiple sclerosis: Secondary | ICD-10-CM | POA: Diagnosis not present

## 2014-06-26 DIAGNOSIS — M5430 Sciatica, unspecified side: Secondary | ICD-10-CM | POA: Diagnosis not present

## 2014-06-28 DIAGNOSIS — M5430 Sciatica, unspecified side: Secondary | ICD-10-CM | POA: Diagnosis not present

## 2014-06-28 DIAGNOSIS — L89323 Pressure ulcer of left buttock, stage 3: Secondary | ICD-10-CM | POA: Diagnosis not present

## 2014-06-28 DIAGNOSIS — I10 Essential (primary) hypertension: Secondary | ICD-10-CM | POA: Diagnosis not present

## 2014-06-28 DIAGNOSIS — E119 Type 2 diabetes mellitus without complications: Secondary | ICD-10-CM | POA: Diagnosis not present

## 2014-06-28 DIAGNOSIS — G35 Multiple sclerosis: Secondary | ICD-10-CM | POA: Diagnosis not present

## 2014-06-28 DIAGNOSIS — L89313 Pressure ulcer of right buttock, stage 3: Secondary | ICD-10-CM | POA: Diagnosis not present

## 2014-06-29 DIAGNOSIS — L89313 Pressure ulcer of right buttock, stage 3: Secondary | ICD-10-CM | POA: Diagnosis not present

## 2014-06-29 DIAGNOSIS — M5430 Sciatica, unspecified side: Secondary | ICD-10-CM | POA: Diagnosis not present

## 2014-06-29 DIAGNOSIS — L89323 Pressure ulcer of left buttock, stage 3: Secondary | ICD-10-CM | POA: Diagnosis not present

## 2014-06-29 DIAGNOSIS — G35 Multiple sclerosis: Secondary | ICD-10-CM | POA: Diagnosis not present

## 2014-06-29 DIAGNOSIS — I10 Essential (primary) hypertension: Secondary | ICD-10-CM | POA: Diagnosis not present

## 2014-06-29 DIAGNOSIS — E119 Type 2 diabetes mellitus without complications: Secondary | ICD-10-CM | POA: Diagnosis not present

## 2014-06-30 DIAGNOSIS — E119 Type 2 diabetes mellitus without complications: Secondary | ICD-10-CM | POA: Diagnosis not present

## 2014-06-30 DIAGNOSIS — G35 Multiple sclerosis: Secondary | ICD-10-CM | POA: Diagnosis not present

## 2014-06-30 DIAGNOSIS — I10 Essential (primary) hypertension: Secondary | ICD-10-CM | POA: Diagnosis not present

## 2014-06-30 DIAGNOSIS — M5430 Sciatica, unspecified side: Secondary | ICD-10-CM | POA: Diagnosis not present

## 2014-06-30 DIAGNOSIS — L89313 Pressure ulcer of right buttock, stage 3: Secondary | ICD-10-CM | POA: Diagnosis not present

## 2014-06-30 DIAGNOSIS — L89323 Pressure ulcer of left buttock, stage 3: Secondary | ICD-10-CM | POA: Diagnosis not present

## 2014-07-03 DIAGNOSIS — I10 Essential (primary) hypertension: Secondary | ICD-10-CM | POA: Diagnosis not present

## 2014-07-03 DIAGNOSIS — E119 Type 2 diabetes mellitus without complications: Secondary | ICD-10-CM | POA: Diagnosis not present

## 2014-07-03 DIAGNOSIS — G35 Multiple sclerosis: Secondary | ICD-10-CM | POA: Diagnosis not present

## 2014-07-03 DIAGNOSIS — M5430 Sciatica, unspecified side: Secondary | ICD-10-CM | POA: Diagnosis not present

## 2014-07-03 DIAGNOSIS — L89323 Pressure ulcer of left buttock, stage 3: Secondary | ICD-10-CM | POA: Diagnosis not present

## 2014-07-03 DIAGNOSIS — L89313 Pressure ulcer of right buttock, stage 3: Secondary | ICD-10-CM | POA: Diagnosis not present

## 2014-07-04 DIAGNOSIS — I10 Essential (primary) hypertension: Secondary | ICD-10-CM | POA: Diagnosis not present

## 2014-07-04 DIAGNOSIS — G35 Multiple sclerosis: Secondary | ICD-10-CM | POA: Diagnosis not present

## 2014-07-04 DIAGNOSIS — M5430 Sciatica, unspecified side: Secondary | ICD-10-CM | POA: Diagnosis not present

## 2014-07-04 DIAGNOSIS — E119 Type 2 diabetes mellitus without complications: Secondary | ICD-10-CM | POA: Diagnosis not present

## 2014-07-04 DIAGNOSIS — L89313 Pressure ulcer of right buttock, stage 3: Secondary | ICD-10-CM | POA: Diagnosis not present

## 2014-07-04 DIAGNOSIS — L89323 Pressure ulcer of left buttock, stage 3: Secondary | ICD-10-CM | POA: Diagnosis not present

## 2014-07-05 ENCOUNTER — Encounter (HOSPITAL_BASED_OUTPATIENT_CLINIC_OR_DEPARTMENT_OTHER): Payer: Medicare Other | Attending: Internal Medicine

## 2014-07-05 DIAGNOSIS — L89313 Pressure ulcer of right buttock, stage 3: Secondary | ICD-10-CM | POA: Diagnosis present

## 2014-07-05 DIAGNOSIS — L89324 Pressure ulcer of left buttock, stage 4: Secondary | ICD-10-CM | POA: Insufficient documentation

## 2014-07-06 DIAGNOSIS — G35 Multiple sclerosis: Secondary | ICD-10-CM | POA: Diagnosis not present

## 2014-07-06 DIAGNOSIS — E119 Type 2 diabetes mellitus without complications: Secondary | ICD-10-CM | POA: Diagnosis not present

## 2014-07-06 DIAGNOSIS — I10 Essential (primary) hypertension: Secondary | ICD-10-CM | POA: Diagnosis not present

## 2014-07-06 DIAGNOSIS — M5430 Sciatica, unspecified side: Secondary | ICD-10-CM | POA: Diagnosis not present

## 2014-07-06 DIAGNOSIS — L89313 Pressure ulcer of right buttock, stage 3: Secondary | ICD-10-CM | POA: Diagnosis not present

## 2014-07-06 DIAGNOSIS — L89323 Pressure ulcer of left buttock, stage 3: Secondary | ICD-10-CM | POA: Diagnosis not present

## 2014-07-07 DIAGNOSIS — I10 Essential (primary) hypertension: Secondary | ICD-10-CM | POA: Diagnosis not present

## 2014-07-07 DIAGNOSIS — G35 Multiple sclerosis: Secondary | ICD-10-CM | POA: Diagnosis not present

## 2014-07-07 DIAGNOSIS — M5430 Sciatica, unspecified side: Secondary | ICD-10-CM | POA: Diagnosis not present

## 2014-07-07 DIAGNOSIS — L89313 Pressure ulcer of right buttock, stage 3: Secondary | ICD-10-CM | POA: Diagnosis not present

## 2014-07-07 DIAGNOSIS — L89323 Pressure ulcer of left buttock, stage 3: Secondary | ICD-10-CM | POA: Diagnosis not present

## 2014-07-07 DIAGNOSIS — E119 Type 2 diabetes mellitus without complications: Secondary | ICD-10-CM | POA: Diagnosis not present

## 2014-07-10 DIAGNOSIS — M5430 Sciatica, unspecified side: Secondary | ICD-10-CM | POA: Diagnosis not present

## 2014-07-10 DIAGNOSIS — I10 Essential (primary) hypertension: Secondary | ICD-10-CM | POA: Diagnosis not present

## 2014-07-10 DIAGNOSIS — L89323 Pressure ulcer of left buttock, stage 3: Secondary | ICD-10-CM | POA: Diagnosis not present

## 2014-07-10 DIAGNOSIS — E119 Type 2 diabetes mellitus without complications: Secondary | ICD-10-CM | POA: Diagnosis not present

## 2014-07-10 DIAGNOSIS — L89313 Pressure ulcer of right buttock, stage 3: Secondary | ICD-10-CM | POA: Diagnosis not present

## 2014-07-10 DIAGNOSIS — G35 Multiple sclerosis: Secondary | ICD-10-CM | POA: Diagnosis not present

## 2014-07-13 DIAGNOSIS — E119 Type 2 diabetes mellitus without complications: Secondary | ICD-10-CM | POA: Diagnosis not present

## 2014-07-13 DIAGNOSIS — I10 Essential (primary) hypertension: Secondary | ICD-10-CM | POA: Diagnosis not present

## 2014-07-13 DIAGNOSIS — M5430 Sciatica, unspecified side: Secondary | ICD-10-CM | POA: Diagnosis not present

## 2014-07-13 DIAGNOSIS — G35 Multiple sclerosis: Secondary | ICD-10-CM | POA: Diagnosis not present

## 2014-07-13 DIAGNOSIS — L89313 Pressure ulcer of right buttock, stage 3: Secondary | ICD-10-CM | POA: Diagnosis not present

## 2014-07-13 DIAGNOSIS — L89323 Pressure ulcer of left buttock, stage 3: Secondary | ICD-10-CM | POA: Diagnosis not present

## 2014-07-17 DIAGNOSIS — G35 Multiple sclerosis: Secondary | ICD-10-CM | POA: Diagnosis not present

## 2014-07-17 DIAGNOSIS — I10 Essential (primary) hypertension: Secondary | ICD-10-CM | POA: Diagnosis not present

## 2014-07-17 DIAGNOSIS — L89313 Pressure ulcer of right buttock, stage 3: Secondary | ICD-10-CM | POA: Diagnosis not present

## 2014-07-17 DIAGNOSIS — L89323 Pressure ulcer of left buttock, stage 3: Secondary | ICD-10-CM | POA: Diagnosis not present

## 2014-07-17 DIAGNOSIS — M5430 Sciatica, unspecified side: Secondary | ICD-10-CM | POA: Diagnosis not present

## 2014-07-17 DIAGNOSIS — E119 Type 2 diabetes mellitus without complications: Secondary | ICD-10-CM | POA: Diagnosis not present

## 2014-07-19 DIAGNOSIS — E119 Type 2 diabetes mellitus without complications: Secondary | ICD-10-CM | POA: Diagnosis not present

## 2014-07-19 DIAGNOSIS — L89313 Pressure ulcer of right buttock, stage 3: Secondary | ICD-10-CM | POA: Diagnosis not present

## 2014-07-19 DIAGNOSIS — M5430 Sciatica, unspecified side: Secondary | ICD-10-CM | POA: Diagnosis not present

## 2014-07-19 DIAGNOSIS — I10 Essential (primary) hypertension: Secondary | ICD-10-CM | POA: Diagnosis not present

## 2014-07-19 DIAGNOSIS — G35 Multiple sclerosis: Secondary | ICD-10-CM | POA: Diagnosis not present

## 2014-07-19 DIAGNOSIS — L89323 Pressure ulcer of left buttock, stage 3: Secondary | ICD-10-CM | POA: Diagnosis not present

## 2014-07-24 DIAGNOSIS — L89313 Pressure ulcer of right buttock, stage 3: Secondary | ICD-10-CM | POA: Diagnosis not present

## 2014-07-24 DIAGNOSIS — G35 Multiple sclerosis: Secondary | ICD-10-CM | POA: Diagnosis not present

## 2014-07-24 DIAGNOSIS — E119 Type 2 diabetes mellitus without complications: Secondary | ICD-10-CM | POA: Diagnosis not present

## 2014-07-24 DIAGNOSIS — I10 Essential (primary) hypertension: Secondary | ICD-10-CM | POA: Diagnosis not present

## 2014-07-24 DIAGNOSIS — M5430 Sciatica, unspecified side: Secondary | ICD-10-CM | POA: Diagnosis not present

## 2014-07-24 DIAGNOSIS — L89323 Pressure ulcer of left buttock, stage 3: Secondary | ICD-10-CM | POA: Diagnosis not present

## 2014-07-26 DIAGNOSIS — M5430 Sciatica, unspecified side: Secondary | ICD-10-CM | POA: Diagnosis not present

## 2014-07-26 DIAGNOSIS — L89313 Pressure ulcer of right buttock, stage 3: Secondary | ICD-10-CM | POA: Diagnosis not present

## 2014-07-26 DIAGNOSIS — I10 Essential (primary) hypertension: Secondary | ICD-10-CM | POA: Diagnosis not present

## 2014-07-26 DIAGNOSIS — E119 Type 2 diabetes mellitus without complications: Secondary | ICD-10-CM | POA: Diagnosis not present

## 2014-07-26 DIAGNOSIS — G35 Multiple sclerosis: Secondary | ICD-10-CM | POA: Diagnosis not present

## 2014-07-26 DIAGNOSIS — L89323 Pressure ulcer of left buttock, stage 3: Secondary | ICD-10-CM | POA: Diagnosis not present

## 2014-07-26 DIAGNOSIS — L89324 Pressure ulcer of left buttock, stage 4: Secondary | ICD-10-CM | POA: Diagnosis not present

## 2014-07-30 DIAGNOSIS — G35 Multiple sclerosis: Secondary | ICD-10-CM | POA: Diagnosis not present

## 2014-07-30 DIAGNOSIS — M5430 Sciatica, unspecified side: Secondary | ICD-10-CM | POA: Diagnosis not present

## 2014-07-30 DIAGNOSIS — L89313 Pressure ulcer of right buttock, stage 3: Secondary | ICD-10-CM | POA: Diagnosis not present

## 2014-07-30 DIAGNOSIS — E119 Type 2 diabetes mellitus without complications: Secondary | ICD-10-CM | POA: Diagnosis not present

## 2014-07-30 DIAGNOSIS — L89323 Pressure ulcer of left buttock, stage 3: Secondary | ICD-10-CM | POA: Diagnosis not present

## 2014-07-30 DIAGNOSIS — I10 Essential (primary) hypertension: Secondary | ICD-10-CM | POA: Diagnosis not present

## 2014-07-31 DIAGNOSIS — E119 Type 2 diabetes mellitus without complications: Secondary | ICD-10-CM | POA: Diagnosis not present

## 2014-07-31 DIAGNOSIS — M5430 Sciatica, unspecified side: Secondary | ICD-10-CM | POA: Diagnosis not present

## 2014-07-31 DIAGNOSIS — L89323 Pressure ulcer of left buttock, stage 3: Secondary | ICD-10-CM | POA: Diagnosis not present

## 2014-07-31 DIAGNOSIS — L89313 Pressure ulcer of right buttock, stage 3: Secondary | ICD-10-CM | POA: Diagnosis not present

## 2014-07-31 DIAGNOSIS — G35 Multiple sclerosis: Secondary | ICD-10-CM | POA: Diagnosis not present

## 2014-07-31 DIAGNOSIS — I10 Essential (primary) hypertension: Secondary | ICD-10-CM | POA: Diagnosis not present

## 2014-08-01 DIAGNOSIS — L89313 Pressure ulcer of right buttock, stage 3: Secondary | ICD-10-CM | POA: Diagnosis not present

## 2014-08-01 DIAGNOSIS — L89323 Pressure ulcer of left buttock, stage 3: Secondary | ICD-10-CM | POA: Diagnosis not present

## 2014-08-01 DIAGNOSIS — I10 Essential (primary) hypertension: Secondary | ICD-10-CM | POA: Diagnosis not present

## 2014-08-01 DIAGNOSIS — G35 Multiple sclerosis: Secondary | ICD-10-CM | POA: Diagnosis not present

## 2014-08-01 DIAGNOSIS — M5430 Sciatica, unspecified side: Secondary | ICD-10-CM | POA: Diagnosis not present

## 2014-08-01 DIAGNOSIS — E119 Type 2 diabetes mellitus without complications: Secondary | ICD-10-CM | POA: Diagnosis not present

## 2014-08-02 DIAGNOSIS — M5430 Sciatica, unspecified side: Secondary | ICD-10-CM | POA: Diagnosis not present

## 2014-08-02 DIAGNOSIS — L89323 Pressure ulcer of left buttock, stage 3: Secondary | ICD-10-CM | POA: Diagnosis not present

## 2014-08-02 DIAGNOSIS — G35 Multiple sclerosis: Secondary | ICD-10-CM | POA: Diagnosis not present

## 2014-08-02 DIAGNOSIS — E119 Type 2 diabetes mellitus without complications: Secondary | ICD-10-CM | POA: Diagnosis not present

## 2014-08-02 DIAGNOSIS — L89213 Pressure ulcer of right hip, stage 3: Secondary | ICD-10-CM | POA: Diagnosis not present

## 2014-08-02 DIAGNOSIS — Z4800 Encounter for change or removal of nonsurgical wound dressing: Secondary | ICD-10-CM | POA: Diagnosis not present

## 2014-08-02 DIAGNOSIS — I10 Essential (primary) hypertension: Secondary | ICD-10-CM | POA: Diagnosis not present

## 2014-08-02 DIAGNOSIS — L89313 Pressure ulcer of right buttock, stage 3: Secondary | ICD-10-CM | POA: Diagnosis not present

## 2014-08-06 ENCOUNTER — Encounter (HOSPITAL_BASED_OUTPATIENT_CLINIC_OR_DEPARTMENT_OTHER): Payer: Medicare Other | Attending: Internal Medicine

## 2014-08-06 DIAGNOSIS — G35 Multiple sclerosis: Secondary | ICD-10-CM | POA: Insufficient documentation

## 2014-08-06 DIAGNOSIS — L89323 Pressure ulcer of left buttock, stage 3: Secondary | ICD-10-CM | POA: Insufficient documentation

## 2014-08-08 DIAGNOSIS — L89313 Pressure ulcer of right buttock, stage 3: Secondary | ICD-10-CM | POA: Diagnosis not present

## 2014-08-08 DIAGNOSIS — M5430 Sciatica, unspecified side: Secondary | ICD-10-CM | POA: Diagnosis not present

## 2014-08-08 DIAGNOSIS — G35 Multiple sclerosis: Secondary | ICD-10-CM | POA: Diagnosis not present

## 2014-08-08 DIAGNOSIS — E119 Type 2 diabetes mellitus without complications: Secondary | ICD-10-CM | POA: Diagnosis not present

## 2014-08-08 DIAGNOSIS — L89323 Pressure ulcer of left buttock, stage 3: Secondary | ICD-10-CM | POA: Diagnosis not present

## 2014-08-08 DIAGNOSIS — Z5181 Encounter for therapeutic drug level monitoring: Secondary | ICD-10-CM | POA: Diagnosis not present

## 2014-08-08 DIAGNOSIS — L89213 Pressure ulcer of right hip, stage 3: Secondary | ICD-10-CM | POA: Diagnosis not present

## 2014-08-09 DIAGNOSIS — L89213 Pressure ulcer of right hip, stage 3: Secondary | ICD-10-CM | POA: Diagnosis not present

## 2014-08-09 DIAGNOSIS — L89313 Pressure ulcer of right buttock, stage 3: Secondary | ICD-10-CM | POA: Diagnosis not present

## 2014-08-09 DIAGNOSIS — L89323 Pressure ulcer of left buttock, stage 3: Secondary | ICD-10-CM | POA: Diagnosis not present

## 2014-08-09 DIAGNOSIS — M5430 Sciatica, unspecified side: Secondary | ICD-10-CM | POA: Diagnosis not present

## 2014-08-09 DIAGNOSIS — E119 Type 2 diabetes mellitus without complications: Secondary | ICD-10-CM | POA: Diagnosis not present

## 2014-08-09 DIAGNOSIS — G35 Multiple sclerosis: Secondary | ICD-10-CM | POA: Diagnosis not present

## 2014-08-10 DIAGNOSIS — L89213 Pressure ulcer of right hip, stage 3: Secondary | ICD-10-CM | POA: Diagnosis not present

## 2014-08-10 DIAGNOSIS — G35 Multiple sclerosis: Secondary | ICD-10-CM | POA: Diagnosis not present

## 2014-08-10 DIAGNOSIS — L89313 Pressure ulcer of right buttock, stage 3: Secondary | ICD-10-CM | POA: Diagnosis not present

## 2014-08-10 DIAGNOSIS — M5430 Sciatica, unspecified side: Secondary | ICD-10-CM | POA: Diagnosis not present

## 2014-08-10 DIAGNOSIS — E119 Type 2 diabetes mellitus without complications: Secondary | ICD-10-CM | POA: Diagnosis not present

## 2014-08-10 DIAGNOSIS — L89323 Pressure ulcer of left buttock, stage 3: Secondary | ICD-10-CM | POA: Diagnosis not present

## 2014-08-14 DIAGNOSIS — L89313 Pressure ulcer of right buttock, stage 3: Secondary | ICD-10-CM | POA: Diagnosis not present

## 2014-08-14 DIAGNOSIS — L89213 Pressure ulcer of right hip, stage 3: Secondary | ICD-10-CM | POA: Diagnosis not present

## 2014-08-14 DIAGNOSIS — L89323 Pressure ulcer of left buttock, stage 3: Secondary | ICD-10-CM | POA: Diagnosis not present

## 2014-08-14 DIAGNOSIS — E119 Type 2 diabetes mellitus without complications: Secondary | ICD-10-CM | POA: Diagnosis not present

## 2014-08-14 DIAGNOSIS — M5430 Sciatica, unspecified side: Secondary | ICD-10-CM | POA: Diagnosis not present

## 2014-08-14 DIAGNOSIS — G35 Multiple sclerosis: Secondary | ICD-10-CM | POA: Diagnosis not present

## 2014-08-15 DIAGNOSIS — E119 Type 2 diabetes mellitus without complications: Secondary | ICD-10-CM | POA: Diagnosis not present

## 2014-08-15 DIAGNOSIS — G35 Multiple sclerosis: Secondary | ICD-10-CM | POA: Diagnosis not present

## 2014-08-15 DIAGNOSIS — L89323 Pressure ulcer of left buttock, stage 3: Secondary | ICD-10-CM | POA: Diagnosis not present

## 2014-08-15 DIAGNOSIS — L89313 Pressure ulcer of right buttock, stage 3: Secondary | ICD-10-CM | POA: Diagnosis not present

## 2014-08-15 DIAGNOSIS — L89213 Pressure ulcer of right hip, stage 3: Secondary | ICD-10-CM | POA: Diagnosis not present

## 2014-08-15 DIAGNOSIS — M5430 Sciatica, unspecified side: Secondary | ICD-10-CM | POA: Diagnosis not present

## 2014-08-16 DIAGNOSIS — M5430 Sciatica, unspecified side: Secondary | ICD-10-CM | POA: Diagnosis not present

## 2014-08-16 DIAGNOSIS — G35 Multiple sclerosis: Secondary | ICD-10-CM | POA: Diagnosis not present

## 2014-08-16 DIAGNOSIS — E119 Type 2 diabetes mellitus without complications: Secondary | ICD-10-CM | POA: Diagnosis not present

## 2014-08-16 DIAGNOSIS — L89323 Pressure ulcer of left buttock, stage 3: Secondary | ICD-10-CM | POA: Diagnosis not present

## 2014-08-16 DIAGNOSIS — L89213 Pressure ulcer of right hip, stage 3: Secondary | ICD-10-CM | POA: Diagnosis not present

## 2014-08-16 DIAGNOSIS — L89313 Pressure ulcer of right buttock, stage 3: Secondary | ICD-10-CM | POA: Diagnosis not present

## 2014-08-21 DIAGNOSIS — M5430 Sciatica, unspecified side: Secondary | ICD-10-CM | POA: Diagnosis not present

## 2014-08-21 DIAGNOSIS — L89213 Pressure ulcer of right hip, stage 3: Secondary | ICD-10-CM | POA: Diagnosis not present

## 2014-08-21 DIAGNOSIS — L89323 Pressure ulcer of left buttock, stage 3: Secondary | ICD-10-CM | POA: Diagnosis not present

## 2014-08-21 DIAGNOSIS — E119 Type 2 diabetes mellitus without complications: Secondary | ICD-10-CM | POA: Diagnosis not present

## 2014-08-21 DIAGNOSIS — G35 Multiple sclerosis: Secondary | ICD-10-CM | POA: Diagnosis not present

## 2014-08-21 DIAGNOSIS — L89313 Pressure ulcer of right buttock, stage 3: Secondary | ICD-10-CM | POA: Diagnosis not present

## 2014-08-24 DIAGNOSIS — E119 Type 2 diabetes mellitus without complications: Secondary | ICD-10-CM | POA: Diagnosis not present

## 2014-08-24 DIAGNOSIS — L89323 Pressure ulcer of left buttock, stage 3: Secondary | ICD-10-CM | POA: Diagnosis not present

## 2014-08-24 DIAGNOSIS — L89313 Pressure ulcer of right buttock, stage 3: Secondary | ICD-10-CM | POA: Diagnosis not present

## 2014-08-24 DIAGNOSIS — L89213 Pressure ulcer of right hip, stage 3: Secondary | ICD-10-CM | POA: Diagnosis not present

## 2014-08-24 DIAGNOSIS — G35 Multiple sclerosis: Secondary | ICD-10-CM | POA: Diagnosis not present

## 2014-08-24 DIAGNOSIS — M5430 Sciatica, unspecified side: Secondary | ICD-10-CM | POA: Diagnosis not present

## 2014-08-27 DIAGNOSIS — L89323 Pressure ulcer of left buttock, stage 3: Secondary | ICD-10-CM | POA: Diagnosis not present

## 2014-08-27 DIAGNOSIS — G35 Multiple sclerosis: Secondary | ICD-10-CM | POA: Diagnosis not present

## 2014-08-28 DIAGNOSIS — E119 Type 2 diabetes mellitus without complications: Secondary | ICD-10-CM | POA: Diagnosis not present

## 2014-08-28 DIAGNOSIS — L89323 Pressure ulcer of left buttock, stage 3: Secondary | ICD-10-CM | POA: Diagnosis not present

## 2014-08-28 DIAGNOSIS — G35 Multiple sclerosis: Secondary | ICD-10-CM | POA: Diagnosis not present

## 2014-08-28 DIAGNOSIS — L89213 Pressure ulcer of right hip, stage 3: Secondary | ICD-10-CM | POA: Diagnosis not present

## 2014-08-28 DIAGNOSIS — L89313 Pressure ulcer of right buttock, stage 3: Secondary | ICD-10-CM | POA: Diagnosis not present

## 2014-08-28 DIAGNOSIS — M5430 Sciatica, unspecified side: Secondary | ICD-10-CM | POA: Diagnosis not present

## 2014-08-29 DIAGNOSIS — L89213 Pressure ulcer of right hip, stage 3: Secondary | ICD-10-CM | POA: Diagnosis not present

## 2014-08-29 DIAGNOSIS — R3915 Urgency of urination: Secondary | ICD-10-CM | POA: Diagnosis not present

## 2014-08-29 DIAGNOSIS — L89323 Pressure ulcer of left buttock, stage 3: Secondary | ICD-10-CM | POA: Diagnosis not present

## 2014-08-29 DIAGNOSIS — E119 Type 2 diabetes mellitus without complications: Secondary | ICD-10-CM | POA: Diagnosis not present

## 2014-08-29 DIAGNOSIS — G35 Multiple sclerosis: Secondary | ICD-10-CM | POA: Diagnosis not present

## 2014-08-29 DIAGNOSIS — M5430 Sciatica, unspecified side: Secondary | ICD-10-CM | POA: Diagnosis not present

## 2014-08-29 DIAGNOSIS — L89313 Pressure ulcer of right buttock, stage 3: Secondary | ICD-10-CM | POA: Diagnosis not present

## 2014-08-30 ENCOUNTER — Encounter (HOSPITAL_COMMUNITY): Payer: Self-pay | Admitting: Nurse Practitioner

## 2014-08-30 ENCOUNTER — Emergency Department (HOSPITAL_COMMUNITY): Payer: Medicare Other

## 2014-08-30 ENCOUNTER — Inpatient Hospital Stay (HOSPITAL_COMMUNITY)
Admission: EM | Admit: 2014-08-30 | Discharge: 2014-09-07 | DRG: 853 | Disposition: A | Discharge status: Home / Self Care | Payer: Medicare Other | Attending: Internal Medicine | Admitting: Internal Medicine

## 2014-08-30 DIAGNOSIS — A4189 Other specified sepsis: Secondary | ICD-10-CM | POA: Diagnosis not present

## 2014-08-30 DIAGNOSIS — T8351XA Infection and inflammatory reaction due to indwelling urinary catheter, initial encounter: Secondary | ICD-10-CM | POA: Diagnosis not present

## 2014-08-30 DIAGNOSIS — A419 Sepsis, unspecified organism: Secondary | ICD-10-CM

## 2014-08-30 DIAGNOSIS — R739 Hyperglycemia, unspecified: Secondary | ICD-10-CM

## 2014-08-30 DIAGNOSIS — R52 Pain, unspecified: Secondary | ICD-10-CM | POA: Diagnosis not present

## 2014-08-30 DIAGNOSIS — E43 Unspecified severe protein-calorie malnutrition: Secondary | ICD-10-CM | POA: Diagnosis present

## 2014-08-30 DIAGNOSIS — Z79899 Other long term (current) drug therapy: Secondary | ICD-10-CM

## 2014-08-30 DIAGNOSIS — Z794 Long term (current) use of insulin: Secondary | ICD-10-CM

## 2014-08-30 DIAGNOSIS — A4102 Sepsis due to Methicillin resistant Staphylococcus aureus: Principal | ICD-10-CM

## 2014-08-30 DIAGNOSIS — Z7401 Bed confinement status: Secondary | ICD-10-CM

## 2014-08-30 DIAGNOSIS — K5900 Constipation, unspecified: Secondary | ICD-10-CM | POA: Diagnosis present

## 2014-08-30 DIAGNOSIS — Z823 Family history of stroke: Secondary | ICD-10-CM

## 2014-08-30 DIAGNOSIS — M542 Cervicalgia: Secondary | ICD-10-CM | POA: Diagnosis not present

## 2014-08-30 DIAGNOSIS — T83511A Infection and inflammatory reaction due to indwelling urethral catheter, initial encounter: Secondary | ICD-10-CM

## 2014-08-30 DIAGNOSIS — G35 Multiple sclerosis: Secondary | ICD-10-CM | POA: Diagnosis present

## 2014-08-30 DIAGNOSIS — E1165 Type 2 diabetes mellitus with hyperglycemia: Secondary | ICD-10-CM | POA: Diagnosis not present

## 2014-08-30 DIAGNOSIS — Z7982 Long term (current) use of aspirin: Secondary | ICD-10-CM

## 2014-08-30 DIAGNOSIS — Z87891 Personal history of nicotine dependence: Secondary | ICD-10-CM

## 2014-08-30 DIAGNOSIS — M869 Osteomyelitis, unspecified: Secondary | ICD-10-CM

## 2014-08-30 DIAGNOSIS — L89154 Pressure ulcer of sacral region, stage 4: Secondary | ICD-10-CM | POA: Diagnosis present

## 2014-08-30 DIAGNOSIS — L89324 Pressure ulcer of left buttock, stage 4: Secondary | ICD-10-CM

## 2014-08-30 DIAGNOSIS — Z833 Family history of diabetes mellitus: Secondary | ICD-10-CM

## 2014-08-30 DIAGNOSIS — D649 Anemia, unspecified: Secondary | ICD-10-CM | POA: Diagnosis not present

## 2014-08-30 DIAGNOSIS — D631 Anemia in chronic kidney disease: Secondary | ICD-10-CM | POA: Diagnosis present

## 2014-08-30 DIAGNOSIS — N390 Urinary tract infection, site not specified: Secondary | ICD-10-CM

## 2014-08-30 DIAGNOSIS — R7881 Bacteremia: Secondary | ICD-10-CM

## 2014-08-30 DIAGNOSIS — N183 Chronic kidney disease, stage 3 (moderate): Secondary | ICD-10-CM | POA: Diagnosis present

## 2014-08-30 DIAGNOSIS — N179 Acute kidney failure, unspecified: Secondary | ICD-10-CM

## 2014-08-30 DIAGNOSIS — M86151 Other acute osteomyelitis, right femur: Secondary | ICD-10-CM | POA: Insufficient documentation

## 2014-08-30 DIAGNOSIS — I129 Hypertensive chronic kidney disease with stage 1 through stage 4 chronic kidney disease, or unspecified chronic kidney disease: Secondary | ICD-10-CM | POA: Diagnosis present

## 2014-08-30 DIAGNOSIS — IMO0001 Reserved for inherently not codable concepts without codable children: Secondary | ICD-10-CM | POA: Insufficient documentation

## 2014-08-30 DIAGNOSIS — I38 Endocarditis, valve unspecified: Secondary | ICD-10-CM

## 2014-08-30 DIAGNOSIS — L89329 Pressure ulcer of left buttock, unspecified stage: Secondary | ICD-10-CM | POA: Insufficient documentation

## 2014-08-30 DIAGNOSIS — E1122 Type 2 diabetes mellitus with diabetic chronic kidney disease: Secondary | ICD-10-CM | POA: Diagnosis present

## 2014-08-30 LAB — URINALYSIS, ROUTINE W REFLEX MICROSCOPIC
BILIRUBIN URINE: NEGATIVE
KETONES UR: NEGATIVE mg/dL
Nitrite: NEGATIVE
PROTEIN: NEGATIVE mg/dL
Specific Gravity, Urine: 1.018 (ref 1.005–1.030)
Urobilinogen, UA: 0.2 mg/dL (ref 0.0–1.0)
pH: 6 (ref 5.0–8.0)

## 2014-08-30 LAB — COMPREHENSIVE METABOLIC PANEL
ALT: 35 U/L (ref 0–53)
AST: 19 U/L (ref 0–37)
Albumin: 3.3 g/dL — ABNORMAL LOW (ref 3.5–5.2)
Alkaline Phosphatase: 92 U/L (ref 39–117)
Anion gap: 8 (ref 5–15)
BUN: 66 mg/dL — ABNORMAL HIGH (ref 6–23)
CALCIUM: 9.4 mg/dL (ref 8.4–10.5)
CO2: 23 mmol/L (ref 19–32)
CREATININE: 1.93 mg/dL — AB (ref 0.50–1.35)
Chloride: 99 mmol/L (ref 96–112)
GFR, EST AFRICAN AMERICAN: 38 mL/min — AB (ref >90)
GFR, EST NON AFRICAN AMERICAN: 33 mL/min — AB (ref >90)
GLUCOSE: 490 mg/dL — AB (ref 70–99)
Potassium: 4.5 mmol/L (ref 3.5–5.1)
Sodium: 130 mmol/L — ABNORMAL LOW (ref 135–145)
Total Bilirubin: 0.6 mg/dL (ref 0.3–1.2)
Total Protein: 7.8 g/dL (ref 6.0–8.3)

## 2014-08-30 LAB — CBC WITH DIFFERENTIAL/PLATELET
Basophils Absolute: 0 10*3/uL (ref 0.0–0.1)
Basophils Relative: 0 % (ref 0–1)
Eosinophils Absolute: 0.3 10*3/uL (ref 0.0–0.7)
Eosinophils Relative: 2 % (ref 0–5)
HEMATOCRIT: 20.9 % — AB (ref 39.0–52.0)
HEMOGLOBIN: 6.8 g/dL — AB (ref 13.0–17.0)
Lymphocytes Relative: 8 % — ABNORMAL LOW (ref 12–46)
Lymphs Abs: 0.9 10*3/uL (ref 0.7–4.0)
MCH: 30.5 pg (ref 26.0–34.0)
MCHC: 32.5 g/dL (ref 30.0–36.0)
MCV: 93.7 fL (ref 78.0–100.0)
MONO ABS: 0.1 10*3/uL (ref 0.1–1.0)
MONOS PCT: 1 % — AB (ref 3–12)
NEUTROS ABS: 10.2 10*3/uL — AB (ref 1.7–7.7)
Neutrophils Relative %: 89 % — ABNORMAL HIGH (ref 43–77)
Platelets: 384 10*3/uL (ref 150–400)
RBC: 2.23 MIL/uL — ABNORMAL LOW (ref 4.22–5.81)
RDW: 14 % (ref 11.5–15.5)
WBC: 11.5 10*3/uL — ABNORMAL HIGH (ref 4.0–10.5)

## 2014-08-30 LAB — TROPONIN I: TROPONIN I: 0.03 ng/mL (ref <0.031)

## 2014-08-30 LAB — URINE MICROSCOPIC-ADD ON

## 2014-08-30 LAB — LACTIC ACID, PLASMA: Lactic Acid, Venous: 2.7 mmol/L (ref 0.5–2.0)

## 2014-08-30 LAB — POC OCCULT BLOOD, ED: Fecal Occult Bld: POSITIVE — AB

## 2014-08-30 MED ORDER — ACETAMINOPHEN 325 MG PO TABS
650.0000 mg | ORAL_TABLET | Freq: Once | ORAL | Status: AC
  Administered 2014-08-30: 650 mg via ORAL
  Filled 2014-08-30: qty 2

## 2014-08-30 MED ORDER — SODIUM CHLORIDE 0.9 % IV BOLUS (SEPSIS)
1000.0000 mL | Freq: Once | INTRAVENOUS | Status: AC
  Administered 2014-08-30: 1000 mL via INTRAVENOUS

## 2014-08-30 MED ORDER — DEXTROSE 5 % IV SOLN
2.0000 g | Freq: Once | INTRAVENOUS | Status: AC
  Administered 2014-08-30: 2 g via INTRAVENOUS
  Filled 2014-08-30: qty 2

## 2014-08-30 MED ORDER — SODIUM CHLORIDE 0.9 % IV BOLUS (SEPSIS)
1000.0000 mL | Freq: Once | INTRAVENOUS | Status: AC
  Administered 2014-08-31: 1000 mL via INTRAVENOUS

## 2014-08-30 MED ORDER — VANCOMYCIN HCL IN DEXTROSE 1-5 GM/200ML-% IV SOLN
1000.0000 mg | Freq: Once | INTRAVENOUS | Status: AC
  Administered 2014-08-31: 1000 mg via INTRAVENOUS
  Filled 2014-08-30: qty 200

## 2014-08-30 MED ORDER — DIAZEPAM 5 MG PO TABS
5.0000 mg | ORAL_TABLET | Freq: Once | ORAL | Status: AC
  Administered 2014-08-30: 5 mg via ORAL
  Filled 2014-08-30: qty 1

## 2014-08-30 NOTE — ED Provider Notes (Signed)
CSN: [id number]     Arrival date & time 08/30/14  2123 History   First MD Initiated Contact with Patient 08/30/14 2130     Chief Complaint  Patient presents with  . Hyperglycemia    502 cbg  . Neck Pain     (Consider location/radiation/quality/duration/timing/severity/associated sxs/prior Treatment) HPI  This is a 74 year old male with history of hypertension, multiple sclerosis, diabetes who presents with neck pain. Patient reports onset of symptoms later this afternoon. He reports bilateral neck pain and spasming. Current pain is 8 out of 10. He states that he could not get comfortable. He is mostly bedbound. The pain radiates into his shoulders. Denies any headache or fever.  Reports that he is cold. Of note, patient has a history of diabetes. Does not take his blood sugars daily. Noted to be hyperglycemic in route.  Does report productive cough over last several days. Denies any chest pain, shortness of breath.  Reports that he in and out caths daily. Over the last several days has reported decreased output and actually provided his outpatient primary physician a urine sample with concerns for urinary tract infection.  Past Medical History  Diagnosis Date  . Hypertension   . Blood transfusion   . Neuralgia neuritis, sciatic nerve     MS for 30 years  . Diabetes mellitus   . PONV (postoperative nausea and vomiting)   . MS (multiple sclerosis)   . Blood transfusion without reported diagnosis    Past Surgical History  Procedure Laterality Date  . Tonsillectomy    . Finger surgery  2004  . Wound debridement     Family History  Problem Relation Age of Onset  . Stroke Father   . Diabetes Father    History  Substance Use Topics  . Smoking status: Former Smoker    Types: Cigars    Quit date: 05/06/1983  . Smokeless tobacco: Never Used  . Alcohol Use: 3.6 oz/week    6 Glasses of wine per week    Review of Systems  Constitutional: Positive for chills. Negative for fever.   Respiratory: Negative.  Negative for chest tightness and shortness of breath.   Cardiovascular: Negative.  Negative for chest pain.  Gastrointestinal: Negative.  Negative for abdominal pain.  Genitourinary: Negative.  Negative for dysuria.  Musculoskeletal: Positive for neck pain. Negative for back pain and neck stiffness.  Skin: Negative for wound.  Neurological: Negative for weakness and headaches.  All other systems reviewed and are negative.     Allergies  Review of patient's allergies indicates no known allergies.  Home Medications   Prior to Admission medications   Medication Sig Start Date End Date Taking? Authorizing Provider  Ascorbic Acid (VITAMIN C PO) Take by mouth.   Yes Historical Provider, MD  aspirin 81 MG tablet Take 1 tablet (81 mg total) by mouth daily. 03/30/11  Yes Rolan Bucco, MD  Cholecalciferol (VITAMIN D3) 2000 UNITS TABS Take 2,000 mg by mouth daily.     Yes Historical Provider, MD  fish oil-omega-3 fatty acids 1000 MG capsule Take 1 capsule (1 g total) by mouth daily. 03/30/11  Yes Rolan Bucco, MD  glimepiride (AMARYL) 2 MG tablet Take 2 mg by mouth daily with breakfast.   Yes Historical Provider, MD  Liniments (SALONPAS PAIN RELIEF PATCH EX) Apply 1 patch topically daily as needed (pain).   Yes Historical Provider, MD  lisinopril (PRINIVIL,ZESTRIL) 10 MG tablet Take 10 mg by mouth daily. 06/15/14  Yes Historical Provider, MD  Multiple Vitamin (MULTIVITAMIN WITH MINERALS) TABS tablet Take 1 tablet by mouth daily.   Yes Historical Provider, MD  oxybutynin (DITROPAN) 5 MG tablet Take 5 mg by mouth 2 (two) times daily.     Yes Historical Provider, MD  pioglitazone (ACTOS) 30 MG tablet Take 30 mg by mouth daily.     Yes Historical Provider, MD  simvastatin (ZOCOR) 20 MG tablet Take 20 mg by mouth every evening.   Yes Historical Provider, MD  trimethoprim (TRIMPEX) 100 MG tablet Take 100 mg by mouth daily. 06/19/14  Yes Historical Provider, MD  bisacodyl  (DULCOLAX) 10 MG suppository Place 1 suppository (10 mg total) rectally daily as needed for moderate constipation. Patient not taking: Reported on 08/30/2014 08/06/13   Josetta Huddle, MD  ferrous sulfate 325 (65 FE) MG tablet Take 1 tablet (325 mg total) by mouth daily with breakfast. 08/06/13   Josetta Huddle, MD  glimepiride (AMARYL) 4 MG tablet Take 4 mg by mouth every morning.      Historical Provider, MD  lisinopril-hydrochlorothiazide (PRINZIDE,ZESTORETIC) 20-25 MG per tablet Take 1 tablet by mouth daily.    Historical Provider, MD   BP 155/64 mmHg  Pulse 88  Temp(Src) 101.1 F (38.4 C) (Rectal)  Resp 18  Ht 6\' 4"  (1.93 m)  Wt 210 lb (95.255 kg)  BMI 25.57 kg/m2  SpO2 96% Physical Exam  Constitutional: He is oriented to person, place, and time.  Elderly, shaking, no acute distress  HENT:  Head: Normocephalic and atraumatic.  Neck: Neck supple.  Tenderness to palpation over the bilateral paraspinous muscle region of the cervical spine, spasm noted, normal range of motion, no meningismus  Cardiovascular: Normal rate, regular rhythm and normal heart sounds.   No murmur heard. Pulmonary/Chest: Effort normal and breath sounds normal. No respiratory distress. He has no wheezes.  Abdominal: Soft. Bowel sounds are normal. There is no tenderness. There is no rebound.  Musculoskeletal: He exhibits no edema.  Neurological: He is alert and oriented to person, place, and time.  Skin: Skin is warm and dry.  Skin breakdown over the left buttock, no associated erythema, granulated  Psychiatric: He has a normal mood and affect.  Nursing note and vitals reviewed.   ED Course  Procedures (including critical care time)  CRITICAL CARE Performed by: Merryl Hacker   Total critical care time: 45 min  Critical care time was exclusive of separately billable procedures and treating other patients.  Critical care was necessary to treat or prevent imminent or life-threatening  deterioration.  Critical care was time spent personally by me on the following activities: development of treatment plan with patient and/or surrogate as well as nursing, discussions with consultants, evaluation of patient's response to treatment, examination of patient, obtaining history from patient or surrogate, ordering and performing treatments and interventions, ordering and review of laboratory studies, ordering and review of radiographic studies, pulse oximetry and re-evaluation of patient's condition.  Labs Review Labs Reviewed  CBC WITH DIFFERENTIAL/PLATELET - Abnormal; Notable for the following:    WBC 11.5 (*)    RBC 2.23 (*)    Hemoglobin 6.8 (*)    HCT 20.9 (*)    Neutrophils Relative % 89 (*)    Neutro Abs 10.2 (*)    Lymphocytes Relative 8 (*)    Monocytes Relative 1 (*)    All other components within normal limits  COMPREHENSIVE METABOLIC PANEL - Abnormal; Notable for the following:    Sodium 130 (*)    Glucose, Bld 490 (*)  BUN 66 (*)    Creatinine, Ser 1.93 (*)    Albumin 3.3 (*)    GFR calc non Af Amer 33 (*)    GFR calc Af Amer 38 (*)    All other components within normal limits  URINALYSIS, ROUTINE W REFLEX MICROSCOPIC - Abnormal; Notable for the following:    APPearance CLOUDY (*)    Glucose, UA >1000 (*)    Hgb urine dipstick TRACE (*)    Leukocytes, UA MODERATE (*)    All other components within normal limits  LACTIC ACID, PLASMA - Abnormal; Notable for the following:    Lactic Acid, Venous 2.7 (*)    All other components within normal limits  URINE MICROSCOPIC-ADD ON - Abnormal; Notable for the following:    Squamous Epithelial / LPF FEW (*)    All other components within normal limits  CULTURE, BLOOD (ROUTINE X 2)  CULTURE, BLOOD (ROUTINE X 2)  URINE CULTURE  TROPONIN I  LACTIC ACID, PLASMA  POC OCCULT BLOOD, ED  TYPE AND SCREEN    Imaging Review Dg Chest Portable 1 View  08/30/2014   CLINICAL DATA:  Bilateral neck pain  EXAM: PORTABLE  CHEST - 1 VIEW  COMPARISON:  03/30/2011  FINDINGS: Normal mediastinum and cardiac silhouette. Normal pulmonary vasculature. No evidence of effusion, infiltrate, or pneumothorax. No acute bony abnormality.  IMPRESSION: No acute cardiopulmonary process.   Electronically Signed   By: Suzy Bouchard M.D.   On: 08/30/2014 22:46     EKG Interpretation None      MDM   Final diagnoses:  Sepsis, due to unspecified organism  Urinary tract infection associated with catheterization of urinary tract, initial encounter  Anemia, unspecified anemia type    Patient presents with neck pain and spasm. Noted to be shaking on initial exam. Also reports recent urinary symptoms. Found to be febrile to 101.1. Otherwise vital signs are reassuring. No true meningismus on exam and patient does not report headache.  Considerations include urinary tract infection or less likely meningitis given neck symptoms. Patient given Tylenol and Valium. Sepsis workup initiated. Given recent urinary symptoms, patient given cefepime. Additionally review of patient's prior urine culture shows MRSA. Vancomycin was added.  Patient has leukocytosis. Urine appears infected with 21-50 white cells and clumps. Lactate is 2.7.  Patient reports improvement of neck spasms with Valium. Discussed with patient and his family the possibility of meningitis. While this is most likely sepsis secondary to urinary tract infection, cannot rule out meningitis without doing an LP. Patient and his family do not want to have an LP at this time. They understand the risk and benefits. Of note, patient's hemoglobin also noted to be 6.8. Baseline around 7.5 to 8. Denies any GI bleeding.  Hemoccult pending. Patient typed and screened. In the setting of sepsis, patient may need blood transfusion. Will admit to the hospitalist for further management.  Discussed with Dr. Ernestina Patches.    Merryl Hacker, MD 08/30/14 [phone]

## 2014-08-30 NOTE — ED Notes (Addendum)
IV team is attempting to start a IV and draw the first set of blood cultures. Hassell Done NT stated he will draw the second of blood cultures and lactic acid.  RN notified

## 2014-08-30 NOTE — ED Notes (Signed)
Pt presents with c/o bilateral neck pain 8/10 which he describes as "muscle spasm radiating to the shoulders." Of note, pt is a type II diabetic with medics noting a cbg 502. AOx4.

## 2014-08-31 ENCOUNTER — Encounter (HOSPITAL_BASED_OUTPATIENT_CLINIC_OR_DEPARTMENT_OTHER): Payer: Self-pay | Admitting: *Deleted

## 2014-08-31 DIAGNOSIS — Z79899 Other long term (current) drug therapy: Secondary | ICD-10-CM | POA: Diagnosis not present

## 2014-08-31 DIAGNOSIS — M869 Osteomyelitis, unspecified: Secondary | ICD-10-CM | POA: Diagnosis not present

## 2014-08-31 DIAGNOSIS — L89154 Pressure ulcer of sacral region, stage 4: Secondary | ICD-10-CM | POA: Diagnosis not present

## 2014-08-31 DIAGNOSIS — I129 Hypertensive chronic kidney disease with stage 1 through stage 4 chronic kidney disease, or unspecified chronic kidney disease: Secondary | ICD-10-CM | POA: Diagnosis present

## 2014-08-31 DIAGNOSIS — T8351XA Infection and inflammatory reaction due to indwelling urinary catheter, initial encounter: Secondary | ICD-10-CM | POA: Diagnosis not present

## 2014-08-31 DIAGNOSIS — E1122 Type 2 diabetes mellitus with diabetic chronic kidney disease: Secondary | ICD-10-CM | POA: Diagnosis present

## 2014-08-31 DIAGNOSIS — IMO0001 Reserved for inherently not codable concepts without codable children: Secondary | ICD-10-CM | POA: Insufficient documentation

## 2014-08-31 DIAGNOSIS — R51 Headache: Secondary | ICD-10-CM

## 2014-08-31 DIAGNOSIS — L89159 Pressure ulcer of sacral region, unspecified stage: Secondary | ICD-10-CM | POA: Diagnosis not present

## 2014-08-31 DIAGNOSIS — E43 Unspecified severe protein-calorie malnutrition: Secondary | ICD-10-CM | POA: Diagnosis present

## 2014-08-31 DIAGNOSIS — N179 Acute kidney failure, unspecified: Secondary | ICD-10-CM

## 2014-08-31 DIAGNOSIS — I10 Essential (primary) hypertension: Secondary | ICD-10-CM | POA: Diagnosis not present

## 2014-08-31 DIAGNOSIS — N390 Urinary tract infection, site not specified: Secondary | ICD-10-CM

## 2014-08-31 DIAGNOSIS — M542 Cervicalgia: Secondary | ICD-10-CM | POA: Diagnosis not present

## 2014-08-31 DIAGNOSIS — D649 Anemia, unspecified: Secondary | ICD-10-CM | POA: Diagnosis not present

## 2014-08-31 DIAGNOSIS — L89224 Pressure ulcer of left hip, stage 4: Secondary | ICD-10-CM | POA: Diagnosis not present

## 2014-08-31 DIAGNOSIS — R739 Hyperglycemia, unspecified: Secondary | ICD-10-CM | POA: Diagnosis not present

## 2014-08-31 DIAGNOSIS — Z833 Family history of diabetes mellitus: Secondary | ICD-10-CM | POA: Diagnosis not present

## 2014-08-31 DIAGNOSIS — K5900 Constipation, unspecified: Secondary | ICD-10-CM | POA: Diagnosis present

## 2014-08-31 DIAGNOSIS — A4101 Sepsis due to Methicillin susceptible Staphylococcus aureus: Secondary | ICD-10-CM | POA: Diagnosis not present

## 2014-08-31 DIAGNOSIS — E1165 Type 2 diabetes mellitus with hyperglycemia: Secondary | ICD-10-CM | POA: Diagnosis present

## 2014-08-31 DIAGNOSIS — M4802 Spinal stenosis, cervical region: Secondary | ICD-10-CM | POA: Diagnosis not present

## 2014-08-31 DIAGNOSIS — A419 Sepsis, unspecified organism: Secondary | ICD-10-CM | POA: Diagnosis present

## 2014-08-31 DIAGNOSIS — G35 Multiple sclerosis: Secondary | ICD-10-CM | POA: Diagnosis not present

## 2014-08-31 DIAGNOSIS — R6520 Severe sepsis without septic shock: Secondary | ICD-10-CM | POA: Diagnosis not present

## 2014-08-31 DIAGNOSIS — Z823 Family history of stroke: Secondary | ICD-10-CM | POA: Diagnosis not present

## 2014-08-31 DIAGNOSIS — D500 Iron deficiency anemia secondary to blood loss (chronic): Secondary | ICD-10-CM | POA: Diagnosis not present

## 2014-08-31 DIAGNOSIS — B9562 Methicillin resistant Staphylococcus aureus infection as the cause of diseases classified elsewhere: Secondary | ICD-10-CM | POA: Diagnosis not present

## 2014-08-31 DIAGNOSIS — Z7401 Bed confinement status: Secondary | ICD-10-CM | POA: Diagnosis not present

## 2014-08-31 DIAGNOSIS — N189 Chronic kidney disease, unspecified: Secondary | ICD-10-CM | POA: Diagnosis not present

## 2014-08-31 DIAGNOSIS — R7881 Bacteremia: Secondary | ICD-10-CM | POA: Diagnosis not present

## 2014-08-31 DIAGNOSIS — D631 Anemia in chronic kidney disease: Secondary | ICD-10-CM | POA: Diagnosis present

## 2014-08-31 DIAGNOSIS — Z794 Long term (current) use of insulin: Secondary | ICD-10-CM | POA: Diagnosis not present

## 2014-08-31 DIAGNOSIS — Z87891 Personal history of nicotine dependence: Secondary | ICD-10-CM | POA: Diagnosis not present

## 2014-08-31 DIAGNOSIS — A4102 Sepsis due to Methicillin resistant Staphylococcus aureus: Secondary | ICD-10-CM | POA: Diagnosis not present

## 2014-08-31 DIAGNOSIS — M4628 Osteomyelitis of vertebra, sacral and sacrococcygeal region: Secondary | ICD-10-CM | POA: Diagnosis not present

## 2014-08-31 DIAGNOSIS — L89309 Pressure ulcer of unspecified buttock, unspecified stage: Secondary | ICD-10-CM | POA: Diagnosis not present

## 2014-08-31 DIAGNOSIS — L89324 Pressure ulcer of left buttock, stage 4: Secondary | ICD-10-CM | POA: Diagnosis not present

## 2014-08-31 DIAGNOSIS — N183 Chronic kidney disease, stage 3 (moderate): Secondary | ICD-10-CM | POA: Diagnosis present

## 2014-08-31 DIAGNOSIS — Z7982 Long term (current) use of aspirin: Secondary | ICD-10-CM | POA: Diagnosis not present

## 2014-08-31 LAB — IRON AND TIBC
Iron: 28 ug/dL — ABNORMAL LOW (ref 42–165)
Saturation Ratios: 12 % — ABNORMAL LOW (ref 20–55)
TIBC: 234 ug/dL (ref 215–435)
UIBC: 206 ug/dL (ref 125–400)

## 2014-08-31 LAB — COMPREHENSIVE METABOLIC PANEL
ALK PHOS: 64 U/L (ref 39–117)
ALT: 25 U/L (ref 0–53)
AST: 23 U/L (ref 0–37)
Albumin: 2.5 g/dL — ABNORMAL LOW (ref 3.5–5.2)
Anion gap: 8 (ref 5–15)
BUN: 58 mg/dL — AB (ref 6–23)
CHLORIDE: 107 mmol/L (ref 96–112)
CO2: 17 mmol/L — AB (ref 19–32)
Calcium: 8.2 mg/dL — ABNORMAL LOW (ref 8.4–10.5)
Creatinine, Ser: 1.99 mg/dL — ABNORMAL HIGH (ref 0.50–1.35)
GFR calc Af Amer: 37 mL/min — ABNORMAL LOW (ref >90)
GFR, EST NON AFRICAN AMERICAN: 32 mL/min — AB (ref >90)
Glucose, Bld: 292 mg/dL — ABNORMAL HIGH (ref 70–99)
Potassium: 3.8 mmol/L (ref 3.5–5.1)
Sodium: 132 mmol/L — ABNORMAL LOW (ref 135–145)
TOTAL PROTEIN: 6.2 g/dL (ref 6.0–8.3)
Total Bilirubin: 0.6 mg/dL (ref 0.3–1.2)

## 2014-08-31 LAB — RETICULOCYTES
RBC.: 2.25 MIL/uL — AB (ref 4.22–5.81)
Retic Count, Absolute: 31.5 10*3/uL (ref 19.0–186.0)
Retic Ct Pct: 1.4 % (ref 0.4–3.1)

## 2014-08-31 LAB — CBG MONITORING, ED
GLUCOSE-CAPILLARY: 413 mg/dL — AB (ref 70–99)
GLUCOSE-CAPILLARY: 302 mg/dL — AB (ref 70–99)
GLUCOSE-CAPILLARY: 254 mg/dL — AB (ref 70–99)
Glucose-Capillary: 445 mg/dL — ABNORMAL HIGH (ref 70–99)

## 2014-08-31 LAB — HEMOGLOBIN AND HEMATOCRIT, BLOOD
HCT: 23 % — ABNORMAL LOW (ref 39.0–52.0)
HCT: 24.1 % — ABNORMAL LOW (ref 39.0–52.0)
Hemoglobin: 7.8 g/dL — ABNORMAL LOW (ref 13.0–17.0)
Hemoglobin: 8 g/dL — ABNORMAL LOW (ref 13.0–17.0)

## 2014-08-31 LAB — CBC WITH DIFFERENTIAL/PLATELET
BASOS PCT: 0 % (ref 0–1)
Basophils Absolute: 0 10*3/uL (ref 0.0–0.1)
Eosinophils Absolute: 0.1 10*3/uL (ref 0.0–0.7)
Eosinophils Relative: 1 % (ref 0–5)
HCT: 25.5 % — ABNORMAL LOW (ref 39.0–52.0)
HEMOGLOBIN: 8.5 g/dL — AB (ref 13.0–17.0)
LYMPHS PCT: 3 % — AB (ref 12–46)
Lymphs Abs: 0.5 10*3/uL — ABNORMAL LOW (ref 0.7–4.0)
MCH: 30.6 pg (ref 26.0–34.0)
MCHC: 33.3 g/dL (ref 30.0–36.0)
MCV: 91.7 fL (ref 78.0–100.0)
MONOS PCT: 3 % (ref 3–12)
Monocytes Absolute: 0.5 10*3/uL (ref 0.1–1.0)
NEUTROS PCT: 94 % — AB (ref 43–77)
Neutro Abs: 19.2 10*3/uL — ABNORMAL HIGH (ref 1.7–7.7)
Platelets: 247 10*3/uL (ref 150–400)
RBC: 2.78 MIL/uL — AB (ref 4.22–5.81)
RDW: 13.7 % (ref 11.5–15.5)
WBC: 20.3 10*3/uL — AB (ref 4.0–10.5)

## 2014-08-31 LAB — LACTIC ACID, PLASMA: Lactic Acid, Venous: 1.2 mmol/L (ref 0.5–2.0)

## 2014-08-31 LAB — TYPE AND SCREEN
ABO/RH(D): A POS
Antibody Screen: NEGATIVE

## 2014-08-31 LAB — GLUCOSE, CAPILLARY
GLUCOSE-CAPILLARY: 328 mg/dL — AB (ref 70–99)
GLUCOSE-CAPILLARY: 361 mg/dL — AB (ref 70–99)
Glucose-Capillary: 392 mg/dL — ABNORMAL HIGH (ref 70–99)

## 2014-08-31 LAB — PROCALCITONIN: Procalcitonin: 0.64 ng/mL

## 2014-08-31 LAB — FERRITIN: FERRITIN: 316 ng/mL (ref 22–322)

## 2014-08-31 LAB — MRSA PCR SCREENING: MRSA by PCR: NEGATIVE

## 2014-08-31 LAB — FOLATE

## 2014-08-31 LAB — VITAMIN B12: Vitamin B-12: 1448 pg/mL — ABNORMAL HIGH (ref 211–911)

## 2014-08-31 MED ORDER — INSULIN DETEMIR 100 UNIT/ML ~~LOC~~ SOLN
8.0000 [IU] | Freq: Every day | SUBCUTANEOUS | Status: DC
  Administered 2014-08-31 – 2014-09-01 (×2): 8 [IU] via SUBCUTANEOUS
  Filled 2014-08-31 (×2): qty 0.08

## 2014-08-31 MED ORDER — VANCOMYCIN HCL IN DEXTROSE 750-5 MG/150ML-% IV SOLN
750.0000 mg | Freq: Two times a day (BID) | INTRAVENOUS | Status: DC
  Administered 2014-08-31 – 2014-09-01 (×2): 750 mg via INTRAVENOUS
  Filled 2014-08-31 (×6): qty 150

## 2014-08-31 MED ORDER — LISINOPRIL 10 MG PO TABS
10.0000 mg | ORAL_TABLET | Freq: Every day | ORAL | Status: DC
  Administered 2014-08-31 – 2014-09-07 (×8): 10 mg via ORAL
  Filled 2014-08-31 (×8): qty 1

## 2014-08-31 MED ORDER — ASPIRIN EC 81 MG PO TBEC
81.0000 mg | DELAYED_RELEASE_TABLET | Freq: Every day | ORAL | Status: DC
  Administered 2014-08-31 – 2014-09-07 (×8): 81 mg via ORAL
  Filled 2014-08-31 (×8): qty 1

## 2014-08-31 MED ORDER — DEXAMETHASONE SODIUM PHOSPHATE 10 MG/ML IJ SOLN
10.0000 mg | Freq: Once | INTRAMUSCULAR | Status: AC
Start: 2014-08-31 — End: 2014-08-31
  Administered 2014-08-31: 10 mg via INTRAVENOUS
  Filled 2014-08-31: qty 1

## 2014-08-31 MED ORDER — CEFTRIAXONE SODIUM 2 G IJ SOLR
2.0000 g | Freq: Once | INTRAMUSCULAR | Status: AC
  Administered 2014-08-31: 2 g via INTRAVENOUS
  Filled 2014-08-31: qty 2

## 2014-08-31 MED ORDER — VANCOMYCIN HCL IN DEXTROSE 1-5 GM/200ML-% IV SOLN: 1000.0000 mg | Freq: Once | INTRAVENOUS | Status: DC

## 2014-08-31 MED ORDER — SIMVASTATIN 20 MG PO TABS
20.0000 mg | ORAL_TABLET | Freq: Every evening | ORAL | Status: DC
  Administered 2014-08-31 – 2014-09-07 (×7): 20 mg via ORAL
  Filled 2014-08-31 (×9): qty 1

## 2014-08-31 MED ORDER — INSULIN ASPART 100 UNIT/ML ~~LOC~~ SOLN
0.0000 [IU] | SUBCUTANEOUS | Status: DC
  Filled 2014-08-31: qty 1

## 2014-08-31 MED ORDER — DEXTROSE 5 % IV SOLN
2.0000 g | Freq: Two times a day (BID) | INTRAVENOUS | Status: DC
  Administered 2014-08-31 – 2014-09-01 (×2): 2 g via INTRAVENOUS
  Filled 2014-08-31 (×3): qty 2

## 2014-08-31 MED ORDER — SODIUM CHLORIDE 0.9 % IV SOLN
2.0000 g | INTRAVENOUS | Status: DC
  Administered 2014-08-31 – 2014-09-01 (×6): 2 g via INTRAVENOUS
  Filled 2014-08-31 (×12): qty 2000

## 2014-08-31 MED ORDER — HEPARIN SODIUM (PORCINE) 5000 UNIT/ML IJ SOLN
5000.0000 [IU] | Freq: Three times a day (TID) | INTRAMUSCULAR | Status: DC
  Administered 2014-08-31 (×2): 5000 [IU] via SUBCUTANEOUS
  Filled 2014-08-31 (×6): qty 1

## 2014-08-31 MED ORDER — SODIUM CHLORIDE 0.9 % IV SOLN
INTRAVENOUS | Status: DC
  Administered 2014-08-31: 01:00:00 via INTRAVENOUS
  Administered 2014-09-01: 1000 mL via INTRAVENOUS
  Administered 2014-09-01: 100 mL/h via INTRAVENOUS
  Administered 2014-09-05: 50 mL/h via INTRAVENOUS
  Administered 2014-09-06: 12:00:00 via INTRAVENOUS

## 2014-08-31 MED ORDER — OXYBUTYNIN CHLORIDE 5 MG PO TABS
5.0000 mg | ORAL_TABLET | Freq: Two times a day (BID) | ORAL | Status: DC
  Administered 2014-08-31 – 2014-09-07 (×16): 5 mg via ORAL
  Filled 2014-08-31 (×19): qty 1

## 2014-08-31 MED ORDER — INSULIN ASPART 100 UNIT/ML ~~LOC~~ SOLN
10.0000 [IU] | SUBCUTANEOUS | Status: DC
  Administered 2014-08-31: 9 [IU] via SUBCUTANEOUS
  Administered 2014-08-31 – 2014-09-01 (×6): 10 [IU] via SUBCUTANEOUS
  Filled 2014-08-31: qty 1

## 2014-08-31 MED ORDER — SODIUM CHLORIDE 0.9 % IV SOLN
2.0000 g | Freq: Once | INTRAVENOUS | Status: AC
  Administered 2014-08-31: 2 g via INTRAVENOUS
  Filled 2014-08-31: qty 2000

## 2014-08-31 NOTE — H&P (Addendum)
Hospitalist Admission History and Physical  Patient name: Lance [name] Medical record number: [id number] Date of birth: 1941-01-12 Age: 74 y.o. Gender: male  Primary Care Provider: [name], MD  Chief Complaint: sepsis, anemia, hyperglycemia, AKI  History of Present Illness:This is a 74 y.o. year old male with significant past medical history of MS, HTN, type 2 DM, anemia, urinary retention, stage 3 CKD presenting with sepsis, anemia, hyperglycemia. Pt reports waking up today with chills, malaise, neck stiffness. Has a baseline history of MS with generalized tremor. Usually bedbound. Also with urinary retention. Internet casts 2-3 times per day. Patient also noticed change in urinary consistency. Noticed more cloudiness and decreased output. Sent a sample to his PCP out of concern for infection. Outpatient urine cultures currently pending. Denies any known sick contacts. Up-to-date on the flu shot. No headache. Denies any bloody stools. Presented to the ER Tmax 11.1, heart rate in the 80s to 100s, respirations in the tens, blood pressure in the 150s, satting 95% on room air. White blood cell count 11.5, hemoglobin 6.8, creatinine 1.93, glucose 490. Lactate 2.7. Chest x-ray negative for pneumonia. Urinalysis indicative of infection. ER physician discussed the potential of possible meningitis. Patient and wife initially declined lumbar puncture however they are still open to this. Started on vancomycin and cefepime per sepsis protocol with concern for likely UTI given overall presentation and patient denying lumbar puncture.  Assessment and Plan:  Active Problems:   Sepsis   Hyperglycemia   AKI (acute kidney injury)   1- Sepsis -meets criteria by T, HR, WBC count -ddx includes urinary and neurological sources -pt/wife initially declined LP, however willing to reconsider pending discussion w/ neurology(discussed case w/ on call neuro) -tx to [facility]  -sepsis protocol w/ neuro  coverage -IV vanc, rocephin, ampicillin -blood cultures, urine cxs -flu panel  -f/u neuro recs  2-Hyperglycemia -A1C -SSI -hold orals  -follow   3-Acute on Chronic Kidney Injury -baseline stage 3 CKD-Cr 1.4-1.7 -Cr 1.98 today -hydrate  -follow  4-Anemia  -baseline hgb 7-8 -pending hemoccult -anemia panel  -type and screen  -consider transfusion in setting of sepsis as clinically indicated   5-MS -at baseline  -follow up neuro recs   FEN/GI: heart health-carb modified diet  Prophylaxis: sub q heparin  Disposition: pending further evaluation  Code Status:Full Code    Patient Active Problem List   Diagnosis Date Noted  . Sepsis 08/31/2014  . Chronic kidney disease, stage 3 08/03/2013  . Essential hypertension, benign 08/03/2013  . Foot infection 08/02/2013  . Cellulitis of foot 08/02/2013  . Diabetes mellitus type 2 in obese 08/02/2013  . Spastic quadriplegia 07/10/2011  . MS (multiple sclerosis) pseudoexacerbation 04/07/2011  . Anemia associated with acute blood loss due to retro/intrapertitoneal hematomas 04/07/2011  . Acute renal failure (ARF) 04/02/2011    Class: Acute  . Multiple sclerosis 03/30/2011  . Diabetes mellitus 03/30/2011  . UTI (lower urinary tract infection) 03/30/2011  . Sacral decubitus ulcer 03/30/2011  . Ulcer of ankle 03/30/2011  . Anemia 03/30/2011  . Nephrolithiasis 03/24/2011   Past Medical History: Past Medical History  Diagnosis Date  . Hypertension   . Blood transfusion   . Neuralgia neuritis, sciatic nerve     MS for 30 years  . Diabetes mellitus   . PONV (postoperative nausea and vomiting)   . MS (multiple sclerosis)   . Blood transfusion without reported diagnosis     Past Surgical History: Past Surgical History  Procedure Laterality Date  . Tonsillectomy    .  Finger surgery  2004  . Wound debridement      Social History: History   Social History  . Marital Status: Married    Spouse Name: N/A  . Number of  Children: N/A  . Years of Education: N/A   Social History Main Topics  . Smoking status: Former Smoker    Types: Cigars    Quit date: 05/06/1983  . Smokeless tobacco: Never Used  . Alcohol Use: 3.6 oz/week    6 Glasses of wine per week  . Drug Use: No  . Sexual Activity: Not Currently    Birth Control/ Protection: None   Other Topics Concern  . None   Social History Narrative    Family History: Family History  Problem Relation Age of Onset  . Stroke Father   . Diabetes Father     Allergies: No Known Allergies  Current Facility-Administered Medications  Medication Dose Route Frequency Provider Last Rate Last Dose  . 0.9 %  sodium chloride infusion   Intravenous Continuous Deneise Lever, MD      . ampicillin (OMNIPEN) 2 g in sodium chloride 0.9 % 50 mL IVPB  2 g Intravenous Once Deneise Lever, MD      . aspirin tablet 81 mg  81 mg Oral Daily Deneise Lever, MD      . cefTRIAXone (ROCEPHIN) 2 g in dextrose 5 % 50 mL IVPB  2 g Intravenous Once Deneise Lever, MD      . dexamethasone (DECADRON) injection 10 mg  10 mg Intravenous Once Deneise Lever, MD      . heparin injection 5,000 Units  5,000 Units Subcutaneous 3 times per day Deneise Lever, MD      . insulin aspart (novoLOG) injection 0-9 Units  0-9 Units Subcutaneous 6 times per day Deneise Lever, MD      . lisinopril (PRINIVIL,ZESTRIL) tablet 10 mg  10 mg Oral Daily Deneise Lever, MD      . oxybutynin (DITROPAN) tablet 5 mg  5 mg Oral BID Deneise Lever, MD      . simvastatin (ZOCOR) tablet 20 mg  20 mg Oral QPM Deneise Lever, MD      . sodium chloride 0.9 % bolus 1,000 mL  1,000 mL Intravenous Once Merryl Hacker, MD      . vancomycin (VANCOCIN) IVPB 1000 mg/200 mL premix  1,000 mg Intravenous Once Merryl Hacker, MD      . vancomycin (VANCOCIN) IVPB 1000 mg/200 mL premix  1,000 mg Intravenous Once Deneise Lever, MD       Current Outpatient Prescriptions  Medication Sig Dispense Refill  .  Ascorbic Acid (VITAMIN C PO) Take by mouth.    Marland Kitchen aspirin 81 MG tablet Take 1 tablet (81 mg total) by mouth daily. 1 tablet 0  . Cholecalciferol (VITAMIN D3) 2000 UNITS TABS Take 2,000 mg by mouth daily.      . fish oil-omega-3 fatty acids 1000 MG capsule Take 1 capsule (1 g total) by mouth daily. 1 capsule 0  . glimepiride (AMARYL) 2 MG tablet Take 2 mg by mouth daily with breakfast.    . Liniments (SALONPAS PAIN RELIEF PATCH EX) Apply 1 patch topically daily as needed (pain).    Marland Kitchen lisinopril (PRINIVIL,ZESTRIL) 10 MG tablet Take 10 mg by mouth daily.    . Multiple Vitamin (MULTIVITAMIN WITH MINERALS) TABS tablet Take 1 tablet by mouth daily.    Marland Kitchen oxybutynin (DITROPAN) 5 MG  tablet Take 5 mg by mouth 2 (two) times daily.      . pioglitazone (ACTOS) 30 MG tablet Take 30 mg by mouth daily.      . simvastatin (ZOCOR) 20 MG tablet Take 20 mg by mouth every evening.    . trimethoprim (TRIMPEX) 100 MG tablet Take 100 mg by mouth daily.    . bisacodyl (DULCOLAX) 10 MG suppository Place 1 suppository (10 mg total) rectally daily as needed for moderate constipation. (Patient not taking: Reported on 08/30/2014) 10 suppository 0  . ferrous sulfate 325 (65 FE) MG tablet Take 1 tablet (325 mg total) by mouth daily with breakfast. 30 tablet 5  . glimepiride (AMARYL) 4 MG tablet Take 4 mg by mouth every morning.      Marland Kitchen lisinopril-hydrochlorothiazide (PRINZIDE,ZESTORETIC) 20-25 MG per tablet Take 1 tablet by mouth daily.     Review Of Systems: 12 point ROS negative except as noted above in HPI.  Physical Exam: Filed Vitals:   08/31/14 0005  BP: 151/96  Pulse: 105  Temp:   Resp: 21    General: cooperative and mildly fatigued  HEENT: PERRLA, extra ocular movement intact and mild nuchal rigidity Heart: S1, S2 normal, no murmur, rub or gallop, regular rate and rhythm Lungs: clear to auscultation, no wheezes or rales and unlabored breathing Abdomen: abdomen is soft without significant tenderness, masses,  organomegaly or guarding Extremities: extremities normal, atraumatic, no cyanosis or edema Skin:no rashes Neurology: generalized tremulousness, CN grossly intact, + mild nuchal rigidity, kernig and brudzinski grossly negative apart from + pain w/ neck flexion,  Labs and Imaging: Lab Results  Component Value Date/Time   NA 130* 08/30/2014 10:22 PM   K 4.5 08/30/2014 10:22 PM   CL 99 08/30/2014 10:22 PM   CO2 23 08/30/2014 10:22 PM   BUN 66* 08/30/2014 10:22 PM   CREATININE 1.93* 08/30/2014 10:22 PM   GLUCOSE 490* 08/30/2014 10:22 PM   Lab Results  Component Value Date   WBC 11.5* 08/30/2014   HGB 6.8* 08/30/2014   HCT 20.9* 08/30/2014   MCV 93.7 08/30/2014   PLT 384 08/30/2014   Urinalysis    Component Value Date/Time   COLORURINE YELLOW 08/30/2014 2203   APPEARANCEUR CLOUDY* 08/30/2014 2203   LABSPEC 1.018 08/30/2014 2203   PHURINE 6.0 08/30/2014 2203   GLUCOSEU >1000* 08/30/2014 2203   HGBUR TRACE* 08/30/2014 2203   BILIRUBINUR NEGATIVE 08/30/2014 2203   KETONESUR NEGATIVE 08/30/2014 2203   PROTEINUR NEGATIVE 08/30/2014 2203   UROBILINOGEN 0.2 08/30/2014 2203   NITRITE NEGATIVE 08/30/2014 2203   LEUKOCYTESUR MODERATE* 08/30/2014 2203       Dg Chest Portable 1 View  08/30/2014   CLINICAL DATA:  Bilateral neck pain  EXAM: PORTABLE CHEST - 1 VIEW  COMPARISON:  03/30/2011  FINDINGS: Normal mediastinum and cardiac silhouette. Normal pulmonary vasculature. No evidence of effusion, infiltrate, or pneumothorax. No acute bony abnormality.  IMPRESSION: No acute cardiopulmonary process.   Electronically Signed   By: Suzy Bouchard M.D.   On: 08/30/2014 22:46           Shanda Howells MD  Pager: [phone]

## 2014-08-31 NOTE — Consult Note (Signed)
NEURO HOSPITALIST CONSULT NOTE    Reason for Consult: possible meningitis  HPI:                                                                                                                                          Lance [name] is an 74 y.o. male with known MS followed by [facility] in past but recently is not followed by anyone.  He is on no medications for his MS. In the past he was on Betaseron but due to SE he stopped using this.  HE is bed bound at baseline but transfers using his UE.  Over the past 24 hours he noted he was having neck discomfort which progressed to point he felt he could not move his head. HE presented to [facility] ED where he was found to have FV and elevated WBC.  He was started on Ampicillin, Ceftriaxone, Vancomycin for urosepsis and meningitis. Over 24 hours his WBC increased to 20.3 and blood cultures grew gram positive cocci. Today he feels much improved since receiving ABX. He still has some neck tenderness but now feels it is much more supple.   Past Medical History  Diagnosis Date  . Hypertension   . Blood transfusion   . Neuralgia neuritis, sciatic nerve     MS for 30 years  . PONV (postoperative nausea and vomiting)   . MS (multiple sclerosis)   . Blood transfusion without reported diagnosis   . CKD (chronic kidney disease), stage III   . Anemia in chronic renal disease   . Chronic spastic paraplegia     secondary to MS  . Self-catheterizes urinary bladder   . Neurogenic bladder   . History of recurrent UTIs   . History of MRSA infection     urine  . Decubitus ulcer of ischium   . Decreased sensation of lower extremity     due to MS  . Type 2 diabetes mellitus     Past Surgical History  Procedure Laterality Date  . Tonsillectomy    . Finger surgery  2004  . Wound debridement    . Extracorporeal shock wave lithotripsy  03-23-2011    Family History  Problem Relation Age of Onset  . Stroke Father   . Diabetes Father      Social History:  reports that he quit smoking about 31 years ago. His smoking use included Cigars. He has never used smokeless tobacco. He reports that he drinks about 3.6 oz of alcohol per week. He reports that he does not use illicit drugs.  No Known Allergies  MEDICATIONS:  Scheduled: . ampicillin (OMNIPEN) IV  2 g Intravenous 6 times per day  . aspirin EC  81 mg Oral Daily  . cefTRIAXone (ROCEPHIN)  IV  2 g Intravenous Q12H  . heparin  5,000 Units Subcutaneous 3 times per day  . insulin aspart  10 Units Subcutaneous 6 times per day  . insulin detemir  8 Units Subcutaneous Daily  . lisinopril  10 mg Oral Daily  . oxybutynin  5 mg Oral BID  . simvastatin  20 mg Oral QPM  . vancomycin  750 mg Intravenous Q12H     ROS:                                                                                                                                       History obtained from the patient  General ROS: negative for - chills, fatigue, fever, night sweats, weight gain or weight loss Psychological ROS: negative for - behavioral disorder, hallucinations, memory difficulties, mood swings or suicidal ideation Ophthalmic ROS: negative for - blurry vision, double vision, eye pain or loss of vision ENT ROS: negative for - epistaxis, nasal discharge, oral lesions, sore throat, tinnitus or vertigo Allergy and Immunology ROS: negative for - hives or itchy/watery eyes Hematological and Lymphatic ROS: negative for - bleeding problems, bruising or swollen lymph nodes Endocrine ROS: negative for - galactorrhea, hair pattern changes, polydipsia/polyuria or temperature intolerance Respiratory ROS: negative for - cough, hemoptysis, shortness of breath or wheezing Cardiovascular ROS: negative for - chest pain, dyspnea on exertion, edema or irregular heartbeat Gastrointestinal ROS: negative  for - abdominal pain, diarrhea, hematemesis, nausea/vomiting or stool incontinence Genito-Urinary ROS: negative for - dysuria, hematuria, incontinence or urinary frequency/urgency Musculoskeletal ROS: negative for - joint swelling or muscular weakness Neurological ROS: as noted in HPI Dermatological ROS: negative for rash and skin lesion changes   Blood pressure 128/53, pulse 93, temperature 97.7 F (36.5 C), temperature source Oral, resp. rate 17, height 6\' 4"  (1.93 m), weight 88.4 kg (194 lb 14.2 oz), SpO2 97 %.   Neurologic Examination:                                                                                                      HEENT-  Normocephalic, no lesions, without obvious abnormality.  Normal external eye and conjunctiva.  Normal TM's bilaterally.  Normal auditory canals and external ears. Normal external nose, mucus membranes and septum.  Normal pharynx. Cardiovascular- S1, S2 normal, pulses palpable throughout   Lungs- chest  clear, no wheezing, rales, normal symmetric air entry Abdomen- normal findings: bowel sounds normal Extremities- no edema Lymph-no adenopathy palpable Musculoskeletal-no joint tenderness, deformity or swelling Skin-warm and dry, no hyperpigmentation, vitiligo, or suspicious lesions  Neurological Examination Mental Status: Alert, oriented, thought content appropriate.  Speech fluent without evidence of aphasia.  Able to follow 3 step commands without difficulty. Cranial Nerves: II: Discs flat bilaterally; Visual fields grossly normal, pupils equal, round, reactive to light and accommodation III,IV, VI: ptosis not present, extra-ocular motions intact bilaterally V,VII: smile symmetric, facial light touch sensation normal bilaterally VIII: hearing normal bilaterally IX,X: uvula rises symmetrically XI: bilateral shoulder shrug XII: midline tongue extension --Mild neck tenderness but no meningismus Motor: Right : Upper extremity   5/5    Left:      Upper extremity   5/5  Lower extremity   0/5     Lower extremity   0/5 --bilateral LE has increased tone and spasticity at baseline. Able to wiggles toes.  Tone and bulk:normal tone throughout; no atrophy noted Sensory: Pinprick and light touch intact throughout, bilaterally Deep Tendon Reflexes: 2+ and symmetric throughout UE and 3+ in KJ. No AJ Plantars: Mute bilaterally Cerebellar: normal finger-to-nose,  Gait: bed bound at baseline.       Lab Results: Basic Metabolic Panel:  Recent Labs Lab 08/30/14 2222 08/31/14 0509  NA 130* 132*  K 4.5 3.8  CL 99 107  CO2 23 17*  GLUCOSE 490* 292*  BUN 66* 58*  CREATININE 1.93* 1.99*  CALCIUM 9.4 8.2*    Liver Function Tests:  Recent Labs Lab 08/30/14 2222 08/31/14 0509  AST 19 23  ALT 35 25  ALKPHOS 92 64  BILITOT 0.6 0.6  PROT 7.8 6.2  ALBUMIN 3.3* 2.5*   No results for input(s): LIPASE, AMYLASE in the last 168 hours. No results for input(s): AMMONIA in the last 168 hours.  CBC:  Recent Labs Lab 08/30/14 2222 08/31/14 0509  WBC 11.5* 20.3*  NEUTROABS 10.2* 19.2*  HGB 6.8* 8.5*  HCT 20.9* 25.5*  MCV 93.7 91.7  PLT 384 247    Cardiac Enzymes:  Recent Labs Lab 08/30/14 2222  TROPONINI 0.03    Lipid Panel: No results for input(s): CHOL, TRIG, HDL, CHOLHDL, VLDL, LDLCALC in the last 168 hours.  CBG:  Recent Labs Lab 08/30/14 2140 08/31/14 0207 08/31/14 0419 08/31/14 0733 08/31/14 1159  GLUCAP 445* 413* 302* 254* 328*    Microbiology: Results for orders placed or performed during the hospital encounter of 08/30/14  Blood culture (routine x 2)     Status: None (Preliminary result)   Collection Time: 08/30/14 11:18 PM  Result Value Ref Range Status   Specimen Description BLOOD LEFT WRIST  Final   Special Requests BOTTLES DRAWN AEROBIC AND ANAEROBIC 5CC  Final   Culture   Final    GRAM POSITIVE COCCI IN CLUSTERS Note: Gram Stain Report Called to,Read Back By and Verified With: DUSTIN  EVERETTE BY INGRAM A 08/31/14 135PM Performed at [facility]    Report Status PENDING  Incomplete    Coagulation Studies: No results for input(s): LABPROT, INR in the last 72 hours.  Imaging: Dg Chest Portable 1 View  08/30/2014   CLINICAL DATA:  Bilateral neck pain  EXAM: PORTABLE CHEST - 1 VIEW  COMPARISON:  03/30/2011  FINDINGS: Normal mediastinum and cardiac silhouette. Normal pulmonary vasculature. No evidence of effusion, infiltrate, or pneumothorax. No acute bony abnormality.  IMPRESSION: No acute cardiopulmonary process.   Electronically Signed  By: Suzy Bouchard M.D.   On: 08/30/2014 22:46    Etta Quill PA-C [facility] [id number]  08/31/2014, 3:52 PM   Assessment/Plan:  74 YO male with known MS presenting to hospital with fever, neck stiffness and increased weakness. Blood cultures grew gram positive cocci.  Currently being treated with  Ampicillin, Ceftriaxone, Vancomycin and feeling much improved. Currently he has no meningeal signs.  At this time would continue current ABX treatment for Sepsis.  No further neurology recommendations.    I personally participated in this patient's evaluation and management, including formulating the above clinical impression and management recommendations.  Rush Farmer M.D. [facility] [phone]

## 2014-08-31 NOTE — ED Notes (Signed)
Contacted Dr. Ernestina Patches about CBG 413. Verbal orders for 10units Insulin aspart (novolog).

## 2014-08-31 NOTE — Progress Notes (Signed)
Utilization Review Completed.  

## 2014-08-31 NOTE — ED Notes (Signed)
[facility] called...klj

## 2014-08-31 NOTE — ED Notes (Signed)
Due to the fact that patient is a hard stick, Probation officer asked RN to wait for blood draw time for lactic at 0015 to draw for the type and screen.  RN agreed.

## 2014-08-31 NOTE — Progress Notes (Signed)
CRITICAL VALUE ALERT  Critical value received:  Gram positive cocci and clusters     Date of notification:  08/31/2014   Time of notification:  [id number]  Critical value read back:yes  Nurse who received alert:  Athelene Hursey A   MD notified (1st page):  Dr. Wyline Copas  Time of first page:  82  MD notified (2nd page):  Time of second page:  Responding MD:    Time MD responded:

## 2014-08-31 NOTE — Progress Notes (Addendum)
Inpatient Diabetes Program Recommendations  AACE/ADA: New Consensus Statement on Inpatient Glycemic Control (2013)  Target Ranges:  Prepandial:   less than 140 mg/dL      Peak postprandial:   less than 180 mg/dL (1-2 hours)      Critically ill patients:  140 - 180 mg/dL   Results for HARSH, SCIPIO (MRN [id number]) as of 08/31/2014 14:23  Ref. Range 08/30/2014 21:40 08/31/2014 02:07 08/31/2014 04:19 08/31/2014 07:33 08/31/2014 11:59  Glucose-Capillary Latest Ref Range: 70-99 mg/dL 445 (H) 413 (H) 302 (H) 254 (H) 328 (H)   Reason for Visit: sepsis, anemia, hyperglycemia, AKI  Diabetes history: DM 2 Outpatient Diabetes medications: Amaryl 2 mg Daily,  Actos 30 mg Daily Current orders for Inpatient glycemic control: Novolog 10 units 6 times a day  Inpatient Diabetes Program Recommendations Insulin - Basal: Patient normally takes Amaryl 2 mg Daily at home. Please consider either starting Amaryl or patient may need to be on low dose basal insulin Levemir 8 units Q24 hrs.   Insulin - Correction: Please consider Novolog 0-20 units TID and Novolog 0-5 units QHS for bedtime coverage.  Thanks,  Tama Headings RN, MSN, [facility] Inpatient Diabetes Coordinator Team Pager [phone]

## 2014-08-31 NOTE — ED Notes (Signed)
Pt aware need to collect respiratory virus panel; to be collected with 1000 medications.

## 2014-08-31 NOTE — Progress Notes (Signed)
REVIEWING PT CHART AND NOTED HE IS INPATIENT AT [facility] FOR SEPSIS AND HG 6.8 AND THAT HE IS MOSTLY BEDBOUND.Marland Kitchen  CALLED AND SPOKE W/ OFFICE FOR DR [name]  , NEITHER SHE or OR SCHEDULER IS AVAILABLE TODAY.  LM FOR DR [name] ABOUT WHERE PT IS AND DX'S, QUESTION PROCEDURE SCHEDULED FOR Wednesday AND ALSO SINCE PT IS MOSTLY BEDBOUND HE WILL TO BE DONE AT MAIN OR.  WILL WATCH SCHEDULE AND HEAR BACK FROM OFFICE.

## 2014-08-31 NOTE — Progress Notes (Signed)
[facility]  Patient Status: Active (receiving services up to time of hospitalization)  [facility] is providing the following services: RN and PT (PT currently on hold per patient request)  If patient discharges after hours, please call [phone].   Lance [name] 08/31/2014, 2:23 PM

## 2014-08-31 NOTE — Progress Notes (Signed)
ANTIBIOTIC CONSULT NOTE - INITIAL  Pharmacy Consult for ampicillin, ceftriaxone, vancomycin Indication: Meningitis, urosepsis  No Known Allergies  Patient Measurements: Height: 6\' 4"  (193 cm) Weight: 210 lb (95.255 kg) IBW/kg (Calculated) : 86.8 Adjusted Body Weight:   Vital Signs: Temp: 101.2 F (38.4 C) (04/29 0530) Temp Source: Rectal (04/28 2204) BP: 125/36 mmHg (04/29 0530) Pulse Rate: 104 (04/29 0530) Intake/Output from previous day:   Intake/Output from this shift:    Labs:  Recent Labs  08/30/14 2222  WBC 11.5*  HGB 6.8*  PLT 384  CREATININE 1.93*   Estimated Creatinine Clearance: 41.9 mL/min (by C-G formula based on Cr of 1.93). No results for input(s): VANCOTROUGH, VANCOPEAK, VANCORANDOM, GENTTROUGH, GENTPEAK, GENTRANDOM, TOBRATROUGH, TOBRAPEAK, TOBRARND, AMIKACINPEAK, AMIKACINTROU, AMIKACIN in the last 72 hours.   Microbiology: No results found for this or any previous visit (from the past 720 hour(s)).  Medical History: Past Medical History  Diagnosis Date  . Hypertension   . Blood transfusion   . Neuralgia neuritis, sciatic nerve     MS for 30 years  . Diabetes mellitus   . PONV (postoperative nausea and vomiting)   . MS (multiple sclerosis)   . Blood transfusion without reported diagnosis     Medications:  Anti-infectives    Start     Dose/Rate Route Frequency Ordered Stop   08/31/14 1200  vancomycin (VANCOCIN) IVPB 750 mg/150 ml premix     750 mg 150 mL/hr over 60 Minutes Intravenous Every 12 hours 08/31/14 0511     08/31/14 1000  cefTRIAXone (ROCEPHIN) 2 g in dextrose 5 % 50 mL IVPB     2 g 100 mL/hr over 30 Minutes Intravenous Every 12 hours 08/31/14 0511     08/31/14 0800  ampicillin (OMNIPEN) 2 g in sodium chloride 0.9 % 50 mL IVPB     2 g 150 mL/hr over 20 Minutes Intravenous 6 times per day 08/31/14 0511     08/31/14 0030  cefTRIAXone (ROCEPHIN) 2 g in dextrose 5 % 50 mL IVPB     2 g 100 mL/hr over 30 Minutes Intravenous  Once  08/31/14 0021 08/31/14 0535   08/31/14 0030  vancomycin (VANCOCIN) IVPB 1000 mg/200 mL premix  Status:  Discontinued     1,000 mg 200 mL/hr over 60 Minutes Intravenous  Once 08/31/14 0021 08/31/14 0029   08/31/14 0030  ampicillin (OMNIPEN) 2 g in sodium chloride 0.9 % 50 mL IVPB     2 g 150 mL/hr over 20 Minutes Intravenous  Once 08/31/14 0021 08/31/14 0452   08/30/14 2315  ceFEPIme (MAXIPIME) 2 g in dextrose 5 % 50 mL IVPB     2 g 100 mL/hr over 30 Minutes Intravenous  Once 08/30/14 2307 08/31/14 0031   08/30/14 2315  vancomycin (VANCOCIN) IVPB 1000 mg/200 mL premix     1,000 mg 200 mL/hr over 60 Minutes Intravenous  Once 08/30/14 2308 08/31/14 0227     Assessment: Bedbound patient with history of MS, urinary retention, CKD stage 3 presents with chills, malaise and neck stiffness.  Patient with urosepsis and r/o meningitis (LP declined). First dose of antibiotics already given.   Goal of Therapy:  Vancomycin trough level 15-20 mcg/ml  Ampicillin, rocephin dosed based on patient weight and renal function   Plan:  Measure antibiotic drug levels at steady state Follow up culture results  Vancomycin 750mg  iv q12hr, ampicillin 2gm iv q4hr, rocephin 2gm iv q12hr  Nani Skillern Crowford 08/31/2014,5:37 AM

## 2014-08-31 NOTE — Progress Notes (Signed)
[facility] PROGRESS NOTE  Lance [name] [id number] [name]: July 21, 1940 DOA: 08/30/2014 PCP: [name], MD  Assessment/Plan: 1. Sepsis with bacteremia and possible neurologic infection 1. Thus far, blood cx with 1/2 gram pos in clusters 2. UA unremarkable for UTI 3. CXR clear 4. Respiratory virus panel pending 5. Neurology was consulted on admit for ? Meningitis, pending input 6. Pt is continued on empiric ampicillin, cefepime, and vanc 7. On exam, stage 4 sacral decub ulcer noted with active drainage per RN. Will obtain MRI of pelvis to r/o osteomyelitis/underlying abscess 2. DM2 1. Poorly controlled glucose 2. A1c is pending although pt claims recent A1c 3 weeks ago was around 7 3. No evidence of DKA 4. Have added levemir 8 units per Diabetic coordinator recs 3. Acute on CKD3 1. Pt is continued on 125cc/hr of NS 4. Anemia 1. Presenting hgb of 6.8 2. Repeat hgb of 8.5 w/o transfusion 3. Baseline hgb around 7-8 4. Pt's stool is heme pos 5. Will check serial h/h and transfuse as needed. If definite trend down, would consult GI 5. MS 1. Neurology consulted per above 6. DVT prophylaxis 1. SCD's  Code Status: Full Family Communication: Pt in room, family at bedside Disposition Plan: Pending   Consultants:  Neurology  Procedures:    Antibiotics:  Ampicillin 4/29>>>  Vanc 4/29>>>  Cefepime 4/29>>> (indicate start date, and stop date if known)  HPI/Subjective: Pt reports feeling better.  Objective: Filed Vitals:   08/31/14 1030 08/31/14 1056 08/31/14 1124 08/31/14 1202  BP: 140/55 131/52 129/55 128/53  Pulse: 92 97 93   Temp: 99.2 F (37.3 C) 99.2 F (37.3 C) 97.8 F (36.6 C) 97.7 F (36.5 C)  TempSrc: Core (Comment) Core (Comment) Oral Oral  Resp: 20 22 21 17   Height:    6\' 4"  (1.93 m)  Weight:    88.4 kg (194 lb 14.2 oz)  SpO2: 96% 96% 96% 97%    Intake/Output Summary (Last 24 hours) at 08/31/14 1600 Last data filed at  08/31/14 1054  Gross per 24 hour  Intake      0 ml  Output    800 ml  Net   -800 ml   Filed Weights   08/30/14 2324 08/31/14 1202  Weight: 95.255 kg (210 lb) 88.4 kg (194 lb 14.2 oz)    Exam:   General:  Awake, in nad  Cardiovascular: regular, s1, s2  Respiratory: normal resp effort, no wheezing  Abdomen: soft,nondistended  Musculoskeletal: perfused, no clubbing   Data Reviewed: Basic Metabolic Panel:  Recent Labs Lab 08/30/14 2222 08/31/14 0509  NA 130* 132*  K 4.5 3.8  CL 99 107  CO2 23 17*  GLUCOSE 490* 292*  BUN 66* 58*  CREATININE 1.93* 1.99*  CALCIUM 9.4 8.2*   Liver Function Tests:  Recent Labs Lab 08/30/14 2222 08/31/14 0509  AST 19 23  ALT 35 25  ALKPHOS 92 64  BILITOT 0.6 0.6  PROT 7.8 6.2  ALBUMIN 3.3* 2.5*   No results for input(s): LIPASE, AMYLASE in the last 168 hours. No results for input(s): AMMONIA in the last 168 hours. CBC:  Recent Labs Lab 08/30/14 2222 08/31/14 0509  WBC 11.5* 20.3*  NEUTROABS 10.2* 19.2*  HGB 6.8* 8.5*  HCT 20.9* 25.5*  MCV 93.7 91.7  PLT 384 247   Cardiac Enzymes:  Recent Labs Lab 08/30/14 2222  TROPONINI 0.03   BNP (last 3 results) No results for input(s): BNP in the last 8760 hours.  ProBNP (last 3  results) No results for input(s): PROBNP in the last 8760 hours.  CBG:  Recent Labs Lab 08/30/14 2140 08/31/14 0207 08/31/14 0419 08/31/14 0733 08/31/14 1159  GLUCAP 445* 413* 302* 254* 328*    Recent Results (from the past 240 hour(s))  Blood culture (routine x 2)     Status: None (Preliminary result)   Collection Time: 08/30/14 11:18 PM  Result Value Ref Range Status   Specimen Description BLOOD LEFT WRIST  Final   Special Requests BOTTLES DRAWN AEROBIC AND ANAEROBIC 5CC  Final   Culture   Final    GRAM POSITIVE COCCI IN CLUSTERS Note: Gram Stain Report Called to,Read Back By and Verified With: DUSTIN EVERETTE BY INGRAM A 08/31/14 135PM Performed at [facility]     Report Status PENDING  Incomplete     Studies: Dg Chest Portable 1 View  08/30/2014   CLINICAL DATA:  Bilateral neck pain  EXAM: PORTABLE CHEST - 1 VIEW  COMPARISON:  03/30/2011  FINDINGS: Normal mediastinum and cardiac silhouette. Normal pulmonary vasculature. No evidence of effusion, infiltrate, or pneumothorax. No acute bony abnormality.  IMPRESSION: No acute cardiopulmonary process.   Electronically Signed   By: Suzy Bouchard M.D.   On: 08/30/2014 22:46    Scheduled Meds: . ampicillin (OMNIPEN) IV  2 g Intravenous 6 times per day  . aspirin EC  81 mg Oral Daily  . cefTRIAXone (ROCEPHIN)  IV  2 g Intravenous Q12H  . heparin  5,000 Units Subcutaneous 3 times per day  . insulin aspart  10 Units Subcutaneous 6 times per day  . insulin detemir  8 Units Subcutaneous Daily  . lisinopril  10 mg Oral Daily  . oxybutynin  5 mg Oral BID  . simvastatin  20 mg Oral QPM  . vancomycin  750 mg Intravenous Q12H   Continuous Infusions: . sodium chloride 125 mL/hr at 08/31/14 0126    Active Problems:   Sepsis   Hyperglycemia   AKI (acute kidney injury)   Meegan Shanafelt, [facility] Pager [phone]. If 7PM-7AM, please contact night-coverage at [email or web address], password [email or web address] 08/31/2014, 4:00 PM  LOS: 0 days

## 2014-09-01 ENCOUNTER — Inpatient Hospital Stay (HOSPITAL_COMMUNITY): Payer: Medicare Other

## 2014-09-01 DIAGNOSIS — N183 Chronic kidney disease, stage 3 (moderate): Secondary | ICD-10-CM

## 2014-09-01 DIAGNOSIS — B9561 Methicillin susceptible Staphylococcus aureus infection as the cause of diseases classified elsewhere: Secondary | ICD-10-CM

## 2014-09-01 DIAGNOSIS — Z87891 Personal history of nicotine dependence: Secondary | ICD-10-CM

## 2014-09-01 DIAGNOSIS — R7881 Bacteremia: Secondary | ICD-10-CM

## 2014-09-01 DIAGNOSIS — G35 Multiple sclerosis: Secondary | ICD-10-CM

## 2014-09-01 DIAGNOSIS — N179 Acute kidney failure, unspecified: Secondary | ICD-10-CM

## 2014-09-01 DIAGNOSIS — E1122 Type 2 diabetes mellitus with diabetic chronic kidney disease: Secondary | ICD-10-CM

## 2014-09-01 DIAGNOSIS — A4101 Sepsis due to Methicillin susceptible Staphylococcus aureus: Secondary | ICD-10-CM

## 2014-09-01 DIAGNOSIS — Z7401 Bed confinement status: Secondary | ICD-10-CM

## 2014-09-01 DIAGNOSIS — R739 Hyperglycemia, unspecified: Secondary | ICD-10-CM

## 2014-09-01 DIAGNOSIS — Z794 Long term (current) use of insulin: Secondary | ICD-10-CM

## 2014-09-01 DIAGNOSIS — I129 Hypertensive chronic kidney disease with stage 1 through stage 4 chronic kidney disease, or unspecified chronic kidney disease: Secondary | ICD-10-CM

## 2014-09-01 DIAGNOSIS — M542 Cervicalgia: Secondary | ICD-10-CM

## 2014-09-01 DIAGNOSIS — L89154 Pressure ulcer of sacral region, stage 4: Secondary | ICD-10-CM

## 2014-09-01 LAB — HEMOGLOBIN AND HEMATOCRIT, BLOOD
HCT: 23.6 % — ABNORMAL LOW (ref 39.0–52.0)
Hemoglobin: 7.8 g/dL — ABNORMAL LOW (ref 13.0–17.0)

## 2014-09-01 LAB — DIFFERENTIAL
BASOS ABS: 0 10*3/uL (ref 0.0–0.1)
Basophils Relative: 0 % (ref 0–1)
EOS PCT: 0 % (ref 0–5)
Eosinophils Absolute: 0.1 10*3/uL (ref 0.0–0.7)
Lymphocytes Relative: 3 % — ABNORMAL LOW (ref 12–46)
Lymphs Abs: 0.7 10*3/uL (ref 0.7–4.0)
Monocytes Absolute: 0.7 10*3/uL (ref 0.1–1.0)
Monocytes Relative: 3 % (ref 3–12)
NEUTROS ABS: 19.7 10*3/uL — AB (ref 1.7–7.7)
Neutrophils Relative %: 94 % — ABNORMAL HIGH (ref 43–77)

## 2014-09-01 LAB — COMPREHENSIVE METABOLIC PANEL
ALK PHOS: 65 U/L (ref 39–117)
ALT: 20 U/L (ref 0–53)
AST: 13 U/L (ref 0–37)
Albumin: 2.2 g/dL — ABNORMAL LOW (ref 3.5–5.2)
Anion gap: 11 (ref 5–15)
BUN: 53 mg/dL — ABNORMAL HIGH (ref 6–23)
CO2: 19 mmol/L (ref 19–32)
Calcium: 8.4 mg/dL (ref 8.4–10.5)
Chloride: 108 mmol/L (ref 96–112)
Creatinine, Ser: 1.85 mg/dL — ABNORMAL HIGH (ref 0.50–1.35)
GFR calc Af Amer: 40 mL/min — ABNORMAL LOW (ref >90)
GFR calc non Af Amer: 34 mL/min — ABNORMAL LOW (ref >90)
GLUCOSE: 240 mg/dL — AB (ref 70–99)
POTASSIUM: 4.3 mmol/L (ref 3.5–5.1)
SODIUM: 138 mmol/L (ref 135–145)
Total Bilirubin: 0.5 mg/dL (ref 0.3–1.2)
Total Protein: 5.9 g/dL — ABNORMAL LOW (ref 6.0–8.3)

## 2014-09-01 LAB — URINE CULTURE: Colony Count: >=100000

## 2014-09-01 LAB — GLUCOSE, CAPILLARY
GLUCOSE-CAPILLARY: 200 mg/dL — AB (ref 70–99)
GLUCOSE-CAPILLARY: 252 mg/dL — AB (ref 70–99)
GLUCOSE-CAPILLARY: 258 mg/dL — AB (ref 70–99)
Glucose-Capillary: 204 mg/dL — ABNORMAL HIGH (ref 70–99)
Glucose-Capillary: 217 mg/dL — ABNORMAL HIGH (ref 70–99)
Glucose-Capillary: 226 mg/dL — ABNORMAL HIGH (ref 70–99)

## 2014-09-01 LAB — HEMOGLOBIN A1C
Hgb A1c MFr Bld: 9.5 % — ABNORMAL HIGH (ref 4.8–5.6)
MEAN PLASMA GLUCOSE: 226 mg/dL

## 2014-09-01 LAB — SEDIMENTATION RATE: Sed Rate: 128 mm/hr — ABNORMAL HIGH (ref 0–16)

## 2014-09-01 LAB — CBC
HCT: 23 % — ABNORMAL LOW (ref 39.0–52.0)
Hemoglobin: 7.8 g/dL — ABNORMAL LOW (ref 13.0–17.0)
MCH: 30.5 pg (ref 26.0–34.0)
MCHC: 33.9 g/dL (ref 30.0–36.0)
MCV: 89.8 fL (ref 78.0–100.0)
PLATELETS: 270 10*3/uL (ref 150–400)
RBC: 2.56 MIL/uL — ABNORMAL LOW (ref 4.22–5.81)
RDW: 14.3 % (ref 11.5–15.5)
WBC: 20.2 10*3/uL — ABNORMAL HIGH (ref 4.0–10.5)

## 2014-09-01 MED ORDER — INSULIN ASPART 100 UNIT/ML ~~LOC~~ SOLN
0.0000 [IU] | Freq: Every day | SUBCUTANEOUS | Status: DC
  Administered 2014-09-01 – 2014-09-02 (×2): 2 [IU] via SUBCUTANEOUS

## 2014-09-01 MED ORDER — INSULIN ASPART 100 UNIT/ML ~~LOC~~ SOLN
5.0000 [IU] | Freq: Three times a day (TID) | SUBCUTANEOUS | Status: DC
  Administered 2014-09-02 (×3): 5 [IU] via SUBCUTANEOUS

## 2014-09-01 MED ORDER — VANCOMYCIN HCL IN DEXTROSE 750-5 MG/150ML-% IV SOLN
750.0000 mg | Freq: Two times a day (BID) | INTRAVENOUS | Status: DC
  Administered 2014-09-02 – 2014-09-04 (×5): 750 mg via INTRAVENOUS
  Filled 2014-09-01 (×6): qty 150

## 2014-09-01 MED ORDER — INSULIN ASPART 100 UNIT/ML ~~LOC~~ SOLN
0.0000 [IU] | Freq: Three times a day (TID) | SUBCUTANEOUS | Status: DC
  Administered 2014-09-01: 8 [IU] via SUBCUTANEOUS
  Administered 2014-09-01: 5 [IU] via SUBCUTANEOUS
  Administered 2014-09-02: 3 [IU] via SUBCUTANEOUS
  Administered 2014-09-02: 8 [IU] via SUBCUTANEOUS
  Administered 2014-09-02: 11 [IU] via SUBCUTANEOUS
  Administered 2014-09-03: 2 [IU] via SUBCUTANEOUS
  Administered 2014-09-03: 8 [IU] via SUBCUTANEOUS
  Administered 2014-09-03: 5 [IU] via SUBCUTANEOUS
  Administered 2014-09-04: 3 [IU] via SUBCUTANEOUS
  Administered 2014-09-04 – 2014-09-05 (×4): 5 [IU] via SUBCUTANEOUS
  Administered 2014-09-06: 3 [IU] via SUBCUTANEOUS
  Administered 2014-09-06: 2 [IU] via SUBCUTANEOUS
  Administered 2014-09-07: 3 [IU] via SUBCUTANEOUS
  Administered 2014-09-07: 5 [IU] via SUBCUTANEOUS

## 2014-09-01 MED ORDER — SACCHAROMYCES BOULARDII 250 MG PO CAPS
250.0000 mg | ORAL_CAPSULE | Freq: Two times a day (BID) | ORAL | Status: DC
  Administered 2014-09-01 – 2014-09-07 (×13): 250 mg via ORAL
  Filled 2014-09-01 (×15): qty 1

## 2014-09-01 MED ORDER — GADOBENATE DIMEGLUMINE 529 MG/ML IV SOLN
9.0000 mL | Freq: Once | INTRAVENOUS | Status: AC | PRN
  Administered 2014-09-01: 9 mL via INTRAVENOUS

## 2014-09-01 MED ORDER — INSULIN DETEMIR 100 UNIT/ML ~~LOC~~ SOLN
12.0000 [IU] | Freq: Every day | SUBCUTANEOUS | Status: DC
  Administered 2014-09-02: 12 [IU] via SUBCUTANEOUS
  Filled 2014-09-01 (×3): qty 0.12

## 2014-09-01 MED ORDER — CEFAZOLIN SODIUM-DEXTROSE 2-3 GM-% IV SOLR
2.0000 g | Freq: Three times a day (TID) | INTRAVENOUS | Status: DC
  Administered 2014-09-02 – 2014-09-03 (×5): 2 g via INTRAVENOUS
  Filled 2014-09-01 (×8): qty 50

## 2014-09-01 NOTE — Progress Notes (Signed)
ANTIBIOTIC CONSULT NOTE  Pharmacy Consult for cefazolin Indication: bacteremia   No Known Allergies  Patient Measurements: Height: 6\' 4"  (193 cm) Weight: 194 lb 14.2 oz (88.4 kg) IBW/kg (Calculated) : 86.8 Adjusted Body Weight:   Vital Signs: Temp: 98.3 F (36.8 C) (04/30 1211) Temp Source: Oral (04/30 1211) BP: 116/58 mmHg (04/30 1211) Pulse Rate: 97 (04/30 1211) Intake/Output from previous day: 04/29 0701 - 04/30 0700 In: 750 [I.V.:750] Out: 1800 [Urine:1800] Intake/Output from this shift: Total I/O In: 360 [P.O.:360] Out: 400 [Urine:400]  Labs:  Recent Labs  08/30/14 2222 08/31/14 0509 08/31/14 1722 08/31/14 2038 09/01/14 0033  WBC 11.5* 20.3*  --   --   --   HGB 6.8* 8.5* 7.8* 8.0* 7.8*  PLT 384 247  --   --   --   CREATININE 1.93* 1.99*  --   --  1.85*   Estimated Creatinine Clearance: 43.7 mL/min (by C-G formula based on Cr of 1.85). No results for input(s): VANCOTROUGH, VANCOPEAK, VANCORANDOM, GENTTROUGH, GENTPEAK, GENTRANDOM, TOBRATROUGH, TOBRAPEAK, TOBRARND, AMIKACINPEAK, AMIKACINTROU, AMIKACIN in the last 72 hours.   Medical History: Past Medical History  Diagnosis Date  . Hypertension   . Blood transfusion   . Neuralgia neuritis, sciatic nerve     MS for 30 years  . PONV (postoperative nausea and vomiting)   . MS (multiple sclerosis)   . Blood transfusion without reported diagnosis   . CKD (chronic kidney disease), stage III   . Anemia in chronic renal disease   . Chronic spastic paraplegia     secondary to MS  . Self-catheterizes urinary bladder   . Neurogenic bladder   . History of recurrent UTIs   . History of MRSA infection     urine  . Decubitus ulcer of ischium   . Decreased sensation of lower extremity     due to MS  . Type 2 diabetes mellitus     Medications:  Prescriptions prior to admission  Medication Sig Dispense Refill Last Dose  . Ascorbic Acid (VITAMIN C PO) Take by mouth.   08/30/2014 at Unknown time  . aspirin 81 MG  tablet Take 1 tablet (81 mg total) by mouth daily. 1 tablet 0 08/29/2014 at Unknown time  . Cholecalciferol (VITAMIN D3) 2000 UNITS TABS Take 2,000 mg by mouth daily.     08/30/2014 at Unknown time  . fish oil-omega-3 fatty acids 1000 MG capsule Take 1 capsule (1 g total) by mouth daily. 1 capsule 0 08/30/2014 at Unknown time  . glimepiride (AMARYL) 2 MG tablet Take 2 mg by mouth daily with breakfast.   08/30/2014 at Unknown time  . Liniments (SALONPAS PAIN RELIEF PATCH EX) Apply 1 patch topically daily as needed (pain).   08/30/2014 at Unknown time  . lisinopril (PRINIVIL,ZESTRIL) 10 MG tablet Take 10 mg by mouth daily.   08/30/2014 at Unknown time  . Multiple Vitamin (MULTIVITAMIN WITH MINERALS) TABS tablet Take 1 tablet by mouth daily.   08/30/2014 at Unknown time  . oxybutynin (DITROPAN) 5 MG tablet Take 5 mg by mouth 2 (two) times daily.     08/30/2014 at Unknown time  . pioglitazone (ACTOS) 30 MG tablet Take 30 mg by mouth daily.     08/30/2014 at Unknown time  . simvastatin (ZOCOR) 20 MG tablet Take 20 mg by mouth every evening.   Past Month at Unknown time  . trimethoprim (TRIMPEX) 100 MG tablet Take 100 mg by mouth daily.   08/30/2014 at Unknown time  . bisacodyl (  DULCOLAX) 10 MG suppository Place 1 suppository (10 mg total) rectally daily as needed for moderate constipation. (Patient not taking: Reported on 08/30/2014) 10 suppository 0   . ferrous sulfate 325 (65 FE) MG tablet Take 1 tablet (325 mg total) by mouth daily with breakfast. 30 tablet 5   . glimepiride (AMARYL) 4 MG tablet Take 4 mg by mouth every morning.     08/02/2013 at Unknown time  . lisinopril-hydrochlorothiazide (PRINZIDE,ZESTORETIC) 20-25 MG per tablet Take 1 tablet by mouth daily.   08/02/2013 at Unknown time   Assessment: Pt with MS, CKD, admitted with urosepsis, found to have staph (2/2 cultures) bacteremia. SCr 1.85, eCrcl 40-45 ml/min.  Goal of Therapy:  Resolution of infection  Plan:  Cefazolin 2 g IV q8h Watch renal  function Follow cultures and speciation Continue vancomycin 750/12h Check VT if [place], PharmD, BCPS Clinical Pharmacist Pager: [phone] 09/01/2014 4:10 PM

## 2014-09-01 NOTE — Progress Notes (Signed)
[facility] PROGRESS NOTE  Lance [name] [id number] [name]: 1940/07/24 DOA: 08/30/2014 PCP: [name], MD  Assessment/Plan: 1. Sepsis with Staph aureus bacteremia 1. Thus far, blood cx with 2/2 Staph aureus in blood 2. UA unremarkable for UTI 3. CXR clear 4. Respiratory virus panel pending 5. Neurology was consulted on admit for ? Meningitis. No further neuro intervention was recommended 6. Pt was initially continued on empiric ampicillin, cefepime, and vanc 7. ID has been consulted, recs for cefazolin with vancomycin and repeat blood cx 8. 2d echo was ordered to r/o vegetations 9. ID recs for MRI of neck to r/o cervical discitis - ordered 10. Pt with stage 4 sacral decub ulcer noted with active drainage. MRI of pelvis to r/o osteomyelitis/underlying abscess has been done, awaiting results 2. DM2 1. Poorly controlled glucose 2. A1c of 9.5. Pt claimed recent A1c 3 weeks ago was around 7 3. No evidence of DKA 4. Will increase levemir to 12 units with 5 units pre-meal insulin 5. Cont on SSI coverage 3. Acute on CKD3 1. Pt was continued on 125cc/hr of NS, decreased to 75cc/hr 2. Renal function improving 4. Anemia 1. Presenting hgb of 6.8 2. Repeat hgb of 8.5 w/o transfusion 3. Baseline hgb around 7-8 4. Pt's stool is heme pos 5. Serial h/h have remained stable thus far. Cont to monitor for now 5. MS 1. Neurology was consulted per above 6. DVT prophylaxis 1. SCD's  Code Status: Full Family Communication: Pt in room Disposition Plan: Pending   Consultants:  Neurology  Procedures:    Antibiotics:  Ampicillin 4/29>>>4/30  Vanc 4/29>>>  Cefepime 4/29>>> 4/30  Cefazolin 4/30>>>  HPI/Subjective: Patient states he feels better today.  Objective: Filed Vitals:   08/31/14 2346 09/01/14 0449 09/01/14 1100 09/01/14 1211  BP: 128/46 147/55 143/50 116/58  Pulse: 77 95 87 97  Temp: 98.3 F (36.8 C) 98.5 F (36.9 C) 98.3 F (36.8 C) 98.3 F  (36.8 C)  TempSrc: Oral Axillary Oral Oral  Resp: 12 21 26 17   Height:      Weight:      SpO2: 98% 97% 89% 95%    Intake/Output Summary (Last 24 hours) at 09/01/14 1705 Last data filed at 09/01/14 1318  Gross per 24 hour  Intake   1110 ml  Output   1400 ml  Net   -290 ml   Filed Weights   08/30/14 2324 08/31/14 1202  Weight: 95.255 kg (210 lb) 88.4 kg (194 lb 14.2 oz)    Exam:   General:  Awake, laying in bed, in nad  Cardiovascular: regular, s1, s2  Respiratory: normal resp effort, no wheezing or crackles  Abdomen: soft,nondistended, obese  Musculoskeletal: perfused, no clubbing   Data Reviewed: Basic Metabolic Panel:  Recent Labs Lab 08/30/14 2222 08/31/14 0509 09/01/14 0033  NA 130* 132* 138  K 4.5 3.8 4.3  CL 99 107 108  CO2 23 17* 19  GLUCOSE 490* 292* 240*  BUN 66* 58* 53*  CREATININE 1.93* 1.99* 1.85*  CALCIUM 9.4 8.2* 8.4   Liver Function Tests:  Recent Labs Lab 08/30/14 2222 08/31/14 0509 09/01/14 0033  AST 19 23 13   ALT 35 25 20  ALKPHOS 92 64 65  BILITOT 0.6 0.6 0.5  PROT 7.8 6.2 5.9*  ALBUMIN 3.3* 2.5* 2.2*   No results for input(s): LIPASE, AMYLASE in the last 168 hours. No results for input(s): AMMONIA in the last 168 hours. CBC:  Recent Labs Lab 08/30/14 2222 08/31/14 0509 08/31/14 1722  08/31/14 2038 09/01/14 0033  WBC 11.5* 20.3*  --   --   --   NEUTROABS 10.2* 19.2*  --   --  19.7*  HGB 6.8* 8.5* 7.8* 8.0* 7.8*  HCT 20.9* 25.5* 23.0* 24.1* 23.6*  MCV 93.7 91.7  --   --   --   PLT 384 247  --   --   --    Cardiac Enzymes:  Recent Labs Lab 08/30/14 2222  TROPONINI 0.03   BNP (last 3 results) No results for input(s): BNP in the last 8760 hours.  ProBNP (last 3 results) No results for input(s): PROBNP in the last 8760 hours.  CBG:  Recent Labs Lab 08/31/14 1955 09/01/14 0009 09/01/14 0449 09/01/14 0826 09/01/14 1210  GLUCAP 361* 252* 200* 204* 258*    Recent Results (from the past 240 hour(s))   Urine culture     Status: None   Collection Time: 08/30/14 10:03 PM  Result Value Ref Range Status   Specimen Description URINE, CLEAN CATCH  Final   Special Requests NONE  Final   Colony Count   Final    >=100,000 COLONIES/ML Performed at [facility]    Culture   Final    Multiple bacterial morphotypes present, none predominant. Suggest appropriate recollection if clinically indicated. Performed at [facility]    Report Status 09/01/2014 FINAL  Final  Blood culture (routine x 2)     Status: None (Preliminary result)   Collection Time: 08/30/14 11:18 PM  Result Value Ref Range Status   Specimen Description BLOOD LEFT WRIST  Final   Special Requests BOTTLES DRAWN AEROBIC AND ANAEROBIC 5CC  Final   Culture   Final    STAPHYLOCOCCUS AUREUS Note: RIFAMPIN AND GENTAMICIN SHOULD NOT BE USED AS SINGLE DRUGS FOR TREATMENT OF STAPH INFECTIONS. Note: Gram Stain Report Called to,Read Back By and Verified With: DUSTIN EVERETTE BY INGRAM A 08/31/14 135PM Performed at [facility]    Report Status PENDING  Incomplete  Blood culture (routine x 2)     Status: None (Preliminary result)   Collection Time: 08/30/14 11:30 PM  Result Value Ref Range Status   Specimen Description BLOOD RIGHT HAND  Final   Special Requests BOTTLES DRAWN AEROBIC AND ANAEROBIC 4CC  Final   Culture   Final    STAPHYLOCOCCUS AUREUS Note: Gram Stain Report Called to,Read Back By and Verified With: DUSTIN EVERETTE 4.29.16 135PM BY AI Performed at [facility]    Report Status PENDING  Incomplete  MRSA PCR Screening     Status: None   Collection Time: 08/31/14  3:28 PM  Result Value Ref Range Status   MRSA by PCR NEGATIVE NEGATIVE Final    Comment:        The GeneXpert MRSA Assay (FDA approved for NASAL specimens only), is one component of a comprehensive MRSA colonization surveillance program. It is not intended to diagnose MRSA infection nor to guide or monitor treatment  for MRSA infections.      Studies: Dg Chest Portable 1 View  08/30/2014   CLINICAL DATA:  Bilateral neck pain  EXAM: PORTABLE CHEST - 1 VIEW  COMPARISON:  03/30/2011  FINDINGS: Normal mediastinum and cardiac silhouette. Normal pulmonary vasculature. No evidence of effusion, infiltrate, or pneumothorax. No acute bony abnormality.  IMPRESSION: No acute cardiopulmonary process.   Electronically Signed   By: Suzy Bouchard M.D.   On: 08/30/2014 22:46    Scheduled Meds: . aspirin EC  81 mg Oral  Daily  .  ceFAZolin (ANCEF) IV  2 g Intravenous 3 times per day  . insulin aspart  0-15 Units Subcutaneous TID WC  . insulin aspart  0-5 Units Subcutaneous QHS  . insulin detemir  8 Units Subcutaneous Daily  . lisinopril  10 mg Oral Daily  . oxybutynin  5 mg Oral BID  . saccharomyces boulardii  250 mg Oral BID  . simvastatin  20 mg Oral QPM  . vancomycin  750 mg Intravenous Q12H   Continuous Infusions: . sodium chloride 100 mL/hr (09/01/14 1153)    Active Problems:   Sepsis   Hyperglycemia   AKI (acute kidney injury)   Blood poisoning   [facility], [place] [facility] Pager [phone]. If 7PM-7AM, please contact night-coverage at [email or web address], password [facility] 09/01/2014, 5:05 PM  LOS: 1 day

## 2014-09-01 NOTE — Consult Note (Addendum)
[facility]  Total days of antibiotics 3        Day 3 vanco        Day 3 amp               Reason for Consult: staph aureus bacteremia    Referring Physician: chiu  Active Problems:   Sepsis   Hyperglycemia   AKI (acute kidney injury)   Blood poisoning    HPI: Lance [name] is a 74 y.o. male  with history of DM, HTN, CKD3 as well as MS, currently not on medications for it or not followed by neurology. He is bed bound at baseline transfers using upper body strength. He was admitted on 4/28 for feeling poorly, chills, and neck stiffness. In the ED, he was noted to have high fevers of 103.84F, and leukocytosis with WBC at 20K. He initially thought his neck pain was due to sleeping poorly had difficulty with moving side to side as well as extension, flexion was ok. He was started on Ampicillin, Ceftriaxone, Vancomycin for meningitis and possible sepsis due to urinary source. He quickly improved over the last 24-48hrs, Infectious work up thus far shows 2 sets positive blood cultures for Staph Aureus, sensitivities pending. He still has some neck tenderness but now feels it is much more supple. He did not under LP. HIs physical exam also shows a draining stage 4 sacral decub ulcer which could be possible source of infection. He was seen by neurology who did not pursue meningitis work-up, signed off. He now remains afebrile over the last 24-36hr. He has a stage IV sacral decub ulcer for which is being managed by the wound care as well as Dr. [name]. He had a wound vac recently but removed in the last few weeks. he has prior hx of having muscle flap many years ago.   Past Medical History  Diagnosis Date  . Hypertension   . Blood transfusion   . Neuralgia neuritis, sciatic nerve     MS for 30 years  . PONV (postoperative nausea and vomiting)   . MS (multiple sclerosis)   . Blood transfusion without reported diagnosis   . CKD (chronic kidney disease), stage III     . Anemia in chronic renal disease   . Chronic spastic paraplegia     secondary to MS  . Self-catheterizes urinary bladder   . Neurogenic bladder   . History of recurrent UTIs   . History of MRSA infection     urine  . Decubitus ulcer of ischium   . Decreased sensation of lower extremity     due to MS  . Type 2 diabetes mellitus     Allergies: No Known Allergies   MEDICATIONS: . aspirin EC  81 mg Oral Daily  . insulin aspart  0-15 Units Subcutaneous TID WC  . insulin aspart  0-5 Units Subcutaneous QHS  . insulin detemir  8 Units Subcutaneous Daily  . lisinopril  10 mg Oral Daily  . oxybutynin  5 mg Oral BID  . saccharomyces boulardii  250 mg Oral BID  . simvastatin  20 mg Oral QPM  . vancomycin  750 mg Intravenous Q12H    History  Substance Use Topics  . Smoking status: Former Smoker    Types: Cigars    Quit date: 05/06/1983  . Smokeless tobacco: Never Used  . Alcohol Use: 3.6 oz/week    6 Glasses of wine per week  quit smoking about  31 years ago. His smoking use included Cigars.  He reports that he drinks about 3.6 oz of alcohol per week.  does not use illicit drugs.  Family History  Problem Relation Age of Onset  . Stroke Father   . Diabetes Father     Review of Systems  Constitutional: Negative for fever, chills, diaphoresis, activity change, appetite change, fatigue and unexpected weight change.  HENT: Negative for congestion, sore throat, rhinorrhea, sneezing, trouble swallowing and sinus pressure.  Eyes: Negative for photophobia and visual disturbance.  Respiratory: Negative for cough, chest tightness, shortness of breath, wheezing and stridor.  Cardiovascular: Negative for chest pain, palpitations and leg swelling.  Gastrointestinal: Negative for nausea, vomiting, abdominal pain, diarrhea, constipation, blood in stool, abdominal distention and anal bleeding.  Genitourinary: Negative for dysuria, hematuria, flank pain and difficulty urinating.   Musculoskeletal: + neck pain. Negative for myalgias, back pain, joint swelling, arthralgias and gait problem.  Skin: Negative for color change, pallor, rash and wound.  Neurological: Negative for dizziness, tremors, weakness and light-headedness.  Hematological: Negative for adenopathy. Does not bruise/bleed easily.  Psychiatric/Behavioral: Negative for behavioral problems, confusion, sleep disturbance, dysphoric mood, decreased concentration and agitation.     OBJECTIVE: Temp:  [97.6 F (36.4 C)-98.5 F (36.9 C)] 98.3 F (36.8 C) (04/30 1211) Pulse Rate:  [76-97] 97 (04/30 1211) Resp:  [12-26] 17 (04/30 1211) BP: (91-147)/(46-77) 116/58 mmHg (04/30 1211) SpO2:  [89 %-99 %] 95 % (04/30 1211) Physical Exam  Constitutional: He is oriented to person, place, and time. He appears well-developed and well-nourished. No distress.  HENT:  Mouth/Throat: Oropharynx is clear and moist. No oropharyngeal exudate.  Cardiovascular: Normal rate, regular rhythm and normal heart sounds. Exam reveals no gallop and no friction rub.  No murmur heard.  Pulmonary/Chest: Effort normal and breath sounds normal. No respiratory distress. He has no wheezes.  Abdominal: Soft. Bowel sounds are normal. He exhibits no distension. There is no tenderness.  Lymphadenopathy: +post cervical chain lymphadenopathy.  no cervical adenopathy.  Neurological: He is alert and oriented to person, place, and time. Decrease strength in lower extremities.  Skin: Skin is warm and dry. No rash noted. No erythema. Did not examine his sacral decub (difficult in moving patient) Psychiatric: He has a normal mood and affect. His behavior is normal.     LABS: Results for orders placed or performed during the hospital encounter of 08/30/14 (from the past 48 hour(s))  CBG monitoring, ED     Status: Abnormal   Collection Time: 08/30/14  9:40 PM  Result Value Ref Range   Glucose-Capillary 445 (H) 70 - 99 mg/dL  Urinalysis, Routine w  reflex microscopic     Status: Abnormal   Collection Time: 08/30/14 10:03 PM  Result Value Ref Range   Color, Urine YELLOW YELLOW   APPearance CLOUDY (A) CLEAR   Specific Gravity, Urine 1.018 1.005 - 1.030   pH 6.0 5.0 - 8.0   Glucose, UA >1000 (A) NEGATIVE mg/dL   Hgb urine dipstick TRACE (A) NEGATIVE   Bilirubin Urine NEGATIVE NEGATIVE   Ketones, ur NEGATIVE NEGATIVE mg/dL   Protein, ur NEGATIVE NEGATIVE mg/dL   Urobilinogen, UA 0.2 0.0 - 1.0 mg/dL   Nitrite NEGATIVE NEGATIVE   Leukocytes, UA MODERATE (A) NEGATIVE  Urine microscopic-add on     Status: Abnormal   Collection Time: 08/30/14 10:03 PM  Result Value Ref Range   Squamous Epithelial / LPF FEW (A) RARE   WBC, UA 21-50 <3 WBC/hpf    Comment:  WBC CLUMPS   Bacteria, UA RARE RARE  Urine culture     Status: None   Collection Time: 08/30/14 10:03 PM  Result Value Ref Range   Specimen Description URINE, CLEAN CATCH    Special Requests NONE    Colony Count      >=100,000 COLONIES/ML Performed at [facility]    Culture      Multiple bacterial morphotypes present, none predominant. Suggest appropriate recollection if clinically indicated. Performed at [facility]    Report Status 09/01/2014 FINAL   CBC with Differential     Status: Abnormal   Collection Time: 08/30/14 10:22 PM  Result Value Ref Range   WBC 11.5 (H) 4.0 - 10.5 K/uL   RBC 2.23 (L) 4.22 - 5.81 MIL/uL   Hemoglobin 6.8 (LL) 13.0 - 17.0 g/dL    Comment: REPEATED TO VERIFY CRITICAL RESULT CALLED TO, READ BACK BY AND VERIFIED WITH: T NEILSON AT 2338 ON 04.28.16 BY G KONTOS    HCT 20.9 (L) 39.0 - 52.0 %   MCV 93.7 78.0 - 100.0 fL   MCH 30.5 26.0 - 34.0 pg   MCHC 32.5 30.0 - 36.0 g/dL   RDW 14.0 11.5 - 15.5 %   Platelets 384 150 - 400 K/uL   Neutrophils Relative % 89 (H) 43 - 77 %   Neutro Abs 10.2 (H) 1.7 - 7.7 K/uL   Lymphocytes Relative 8 (L) 12 - 46 %   Lymphs Abs 0.9 0.7 - 4.0 K/uL   Monocytes Relative 1 (L) 3 - 12 %   Monocytes  Absolute 0.1 0.1 - 1.0 K/uL   Eosinophils Relative 2 0 - 5 %   Eosinophils Absolute 0.3 0.0 - 0.7 K/uL   Basophils Relative 0 0 - 1 %   Basophils Absolute 0.0 0.0 - 0.1 K/uL  Comprehensive metabolic panel     Status: Abnormal   Collection Time: 08/30/14 10:22 PM  Result Value Ref Range   Sodium 130 (L) 135 - 145 mmol/L   Potassium 4.5 3.5 - 5.1 mmol/L   Chloride 99 96 - 112 mmol/L   CO2 23 19 - 32 mmol/L   Glucose, Bld 490 (H) 70 - 99 mg/dL   BUN 66 (H) 6 - 23 mg/dL   Creatinine, Ser 1.93 (H) 0.50 - 1.35 mg/dL   Calcium 9.4 8.4 - 10.5 mg/dL   Total Protein 7.8 6.0 - 8.3 g/dL   Albumin 3.3 (L) 3.5 - 5.2 g/dL   AST 19 0 - 37 U/L   ALT 35 0 - 53 U/L   Alkaline Phosphatase 92 39 - 117 U/L   Total Bilirubin 0.6 0.3 - 1.2 mg/dL   GFR calc non Af Amer 33 (L) >90 mL/min   GFR calc Af Amer 38 (L) >90 mL/min    Comment: (NOTE) The eGFR has been calculated using the CKD EPI equation. This calculation has not been validated in all clinical situations. eGFR's persistently <90 mL/min signify possible Chronic Kidney Disease.    Anion gap 8 5 - 15  Troponin I     Status: None   Collection Time: 08/30/14 10:22 PM  Result Value Ref Range   Troponin I 0.03 <0.031 ng/mL    Comment:        NO INDICATION OF MYOCARDIAL INJURY.   Lactic acid, plasma     Status: Abnormal   Collection Time: 08/30/14 10:22 PM  Result Value Ref Range   Lactic Acid, Venous 2.7 (HH) 0.5 - 2.0 mmol/L  Comment: REPEATED TO VERIFY CRITICAL RESULT CALLED TO, READ BACK BY AND VERIFIED WITH: NIELSEN,T/ED _0  ON 08/30/14 BY KARCZEWSKI,S.   Hemoglobin A1c     Status: Abnormal   Collection Time: 08/30/14 10:36 PM  Result Value Ref Range   Hgb A1c MFr Bld 9.5 (H) 4.8 - 5.6 %    Comment: (NOTE)         Pre-diabetes: 5.7 - 6.4         Diabetes: >6.4         Glycemic control for adults with diabetes: <7.0    Mean Plasma Glucose 226 mg/dL    Comment: (NOTE) Performed At: [facility] [place], [place] [id number] Lindon Romp MD IA:[id number]   Blood culture (routine x 2)     Status: None (Preliminary result)   Collection Time: 08/30/14 11:18 PM  Result Value Ref Range   Specimen Description BLOOD LEFT WRIST    Special Requests BOTTLES DRAWN AEROBIC AND ANAEROBIC 5CC    Culture      STAPHYLOCOCCUS AUREUS Note: RIFAMPIN AND GENTAMICIN SHOULD NOT BE USED AS SINGLE DRUGS FOR TREATMENT OF STAPH INFECTIONS. Note: Gram Stain Report Called to,Read Back By and Verified With: DUSTIN EVERETTE BY INGRAM A 08/31/14 135PM Performed at [facility]    Report Status PENDING   Blood culture (routine x 2)     Status: None (Preliminary result)   Collection Time: 08/30/14 11:30 PM  Result Value Ref Range   Specimen Description BLOOD RIGHT HAND    Special Requests BOTTLES DRAWN AEROBIC AND ANAEROBIC 4CC    Culture      STAPHYLOCOCCUS AUREUS Note: Gram Stain Report Called to,Read Back By and Verified With: DUSTIN EVERETTE 4.29.16 135PM BY AI Performed at [facility]    Report Status PENDING   POC occult blood, ED     Status: Abnormal   Collection Time: 08/30/14 11:52 PM  Result Value Ref Range   Fecal Occult Bld POSITIVE (A) NEGATIVE  Procalcitonin     Status: None   Collection Time: 08/31/14 12:36 AM  Result Value Ref Range   Procalcitonin 0.64 ng/mL    Comment:        Interpretation: PCT > 0.5 ng/mL and <= 2 ng/mL: Systemic infection (sepsis) is possible, but other conditions are known to elevate PCT as well. (NOTE)         ICU PCT Algorithm               Non ICU PCT Algorithm    ----------------------------     ------------------------------         PCT < 0.25 ng/mL                 PCT < 0.1 ng/mL     Stopping of antibiotics            Stopping of antibiotics       strongly encouraged.               strongly encouraged.    ----------------------------     ------------------------------       PCT level decrease by               PCT < 0.25 ng/mL        >= 80% from peak PCT       OR PCT 0.25 - 0.5 ng/mL          Stopping of antibiotics  encouraged.     Stopping of antibiotics           encouraged.    ----------------------------     ------------------------------       PCT level decrease by              PCT >= 0.25 ng/mL       < 80% from peak PCT        AND PCT >= 0.5 ng/mL             Continuing antibiotics                                              encouraged.       Continuing antibiotics            encouraged.    ----------------------------     ------------------------------     PCT level increase compared          PCT > 0.5 ng/mL         with peak PCT AND          PCT >= 0.5 ng/mL             Escalation of antibiotics                                          strongly encouraged.      Escalation of antibiotics        strongly encouraged.   Vitamin B12     Status: Abnormal   Collection Time: 08/31/14 12:36 AM  Result Value Ref Range   Vitamin B-12 1448 (H) 211 - 911 pg/mL    Comment: Performed at [facility]  Folate     Status: None   Collection Time: 08/31/14 12:36 AM  Result Value Ref Range   Folate >20.0 ng/mL    Comment: (NOTE) Reference Ranges        Deficient:       0.4 - 3.3 ng/mL        Indeterminate:   3.4 - 5.4 ng/mL        Normal:              > 5.4 ng/mL Performed at [facility]   Iron and TIBC     Status: Abnormal   Collection Time: 08/31/14 12:36 AM  Result Value Ref Range   Iron 28 (L) 42 - 165 ug/dL   TIBC 234 215 - 435 ug/dL   Saturation Ratios 12 (L) 20 - 55 %   UIBC 206 125 - 400 ug/dL    Comment: Performed at [facility]  Ferritin     Status: None   Collection Time: 08/31/14 12:36 AM  Result Value Ref Range   Ferritin 316 22 - 322 ng/mL    Comment: Performed at [facility]  Reticulocytes     Status: Abnormal   Collection Time: 08/31/14 12:36 AM  Result Value Ref Range   Retic Ct Pct 1.4 0.4 - 3.1 %   RBC. 2.25  (L) 4.22 - 5.81 MIL/uL   Retic Count, Manual 31.5 19.0 - 186.0 K/uL  Lactic acid, plasma     Status: None   Collection Time: 08/31/14 12:37 AM  Result Value Ref Range  Lactic Acid, Venous 1.2 0.5 - 2.0 mmol/L  Type and screen     Status: None   Collection Time: 08/31/14 12:41 AM  Result Value Ref Range   ABO/RH(D) A POS    Antibody Screen NEG    Sample Expiration 09/03/2014   CBG monitoring, ED     Status: Abnormal   Collection Time: 08/31/14  2:07 AM  Result Value Ref Range   Glucose-Capillary 413 (H) 70 - 99 mg/dL  CBG monitoring, ED     Status: Abnormal   Collection Time: 08/31/14  4:19 AM  Result Value Ref Range   Glucose-Capillary 302 (H) 70 - 99 mg/dL   Comment 1 Notify RN   Comprehensive metabolic panel     Status: Abnormal   Collection Time: 08/31/14  5:09 AM  Result Value Ref Range   Sodium 132 (L) 135 - 145 mmol/L   Potassium 3.8 3.5 - 5.1 mmol/L   Chloride 107 96 - 112 mmol/L    Comment: DELTA CHECK NOTED REPEATED TO VERIFY    CO2 17 (L) 19 - 32 mmol/L   Glucose, Bld 292 (H) 70 - 99 mg/dL   BUN 58 (H) 6 - 23 mg/dL   Creatinine, Ser 1.99 (H) 0.50 - 1.35 mg/dL   Calcium 8.2 (L) 8.4 - 10.5 mg/dL   Total Protein 6.2 6.0 - 8.3 g/dL   Albumin 2.5 (L) 3.5 - 5.2 g/dL   AST 23 0 - 37 U/L   ALT 25 0 - 53 U/L   Alkaline Phosphatase 64 39 - 117 U/L   Total Bilirubin 0.6 0.3 - 1.2 mg/dL   GFR calc non Af Amer 32 (L) >90 mL/min   GFR calc Af Amer 37 (L) >90 mL/min    Comment: (NOTE) The eGFR has been calculated using the CKD EPI equation. This calculation has not been validated in all clinical situations. eGFR's persistently <90 mL/min signify possible Chronic Kidney Disease.    Anion gap 8 5 - 15  CBC WITH DIFFERENTIAL     Status: Abnormal   Collection Time: 08/31/14  5:09 AM  Result Value Ref Range   WBC 20.3 (H) 4.0 - 10.5 K/uL   RBC 2.78 (L) 4.22 - 5.81 MIL/uL   Hemoglobin 8.5 (L) 13.0 - 17.0 g/dL    Comment: REPEATED TO VERIFY DELTA CHECK NOTED    HCT  25.5 (L) 39.0 - 52.0 %   MCV 91.7 78.0 - 100.0 fL   MCH 30.6 26.0 - 34.0 pg   MCHC 33.3 30.0 - 36.0 g/dL   RDW 13.7 11.5 - 15.5 %   Platelets 247 150 - 400 K/uL    Comment: REPEATED TO VERIFY DELTA CHECK NOTED    Neutrophils Relative % 94 (H) 43 - 77 %   Neutro Abs 19.2 (H) 1.7 - 7.7 K/uL   Lymphocytes Relative 3 (L) 12 - 46 %   Lymphs Abs 0.5 (L) 0.7 - 4.0 K/uL   Monocytes Relative 3 3 - 12 %   Monocytes Absolute 0.5 0.1 - 1.0 K/uL   Eosinophils Relative 1 0 - 5 %   Eosinophils Absolute 0.1 0.0 - 0.7 K/uL   Basophils Relative 0 0 - 1 %   Basophils Absolute 0.0 0.0 - 0.1 K/uL  CBG monitoring, ED     Status: Abnormal   Collection Time: 08/31/14  7:33 AM  Result Value Ref Range   Glucose-Capillary 254 (H) 70 - 99 mg/dL  Glucose, capillary     Status: Abnormal  Collection Time: 08/31/14 11:59 AM  Result Value Ref Range   Glucose-Capillary 328 (H) 70 - 99 mg/dL  MRSA PCR Screening     Status: None   Collection Time: 08/31/14  3:28 PM  Result Value Ref Range   MRSA by PCR NEGATIVE NEGATIVE    Comment:        The GeneXpert MRSA Assay (FDA approved for NASAL specimens only), is one component of a comprehensive MRSA colonization surveillance program. It is not intended to diagnose MRSA infection nor to guide or monitor treatment for MRSA infections.   Glucose, capillary     Status: Abnormal   Collection Time: 08/31/14  5:02 PM  Result Value Ref Range   Glucose-Capillary 392 (H) 70 - 99 mg/dL  Hemoglobin and hematocrit, blood     Status: Abnormal   Collection Time: 08/31/14  5:22 PM  Result Value Ref Range   Hemoglobin 7.8 (L) 13.0 - 17.0 g/dL   HCT 23.0 (L) 39.0 - 52.0 %  Glucose, capillary     Status: Abnormal   Collection Time: 08/31/14  7:55 PM  Result Value Ref Range   Glucose-Capillary 361 (H) 70 - 99 mg/dL  Hemoglobin and hematocrit, blood     Status: Abnormal   Collection Time: 08/31/14  8:38 PM  Result Value Ref Range   Hemoglobin 8.0 (L) 13.0 - 17.0 g/dL    HCT 24.1 (L) 39.0 - 52.0 %  Glucose, capillary     Status: Abnormal   Collection Time: 09/01/14 12:09 AM  Result Value Ref Range   Glucose-Capillary 252 (H) 70 - 99 mg/dL  Hemoglobin and hematocrit, blood     Status: Abnormal   Collection Time: 09/01/14 12:33 AM  Result Value Ref Range   Hemoglobin 7.8 (L) 13.0 - 17.0 g/dL   HCT 23.6 (L) 39.0 - 52.0 %  Comprehensive metabolic panel     Status: Abnormal   Collection Time: 09/01/14 12:33 AM  Result Value Ref Range   Sodium 138 135 - 145 mmol/L   Potassium 4.3 3.5 - 5.1 mmol/L   Chloride 108 96 - 112 mmol/L   CO2 19 19 - 32 mmol/L   Glucose, Bld 240 (H) 70 - 99 mg/dL   BUN 53 (H) 6 - 23 mg/dL   Creatinine, Ser 1.85 (H) 0.50 - 1.35 mg/dL   Calcium 8.4 8.4 - 10.5 mg/dL   Total Protein 5.9 (L) 6.0 - 8.3 g/dL   Albumin 2.2 (L) 3.5 - 5.2 g/dL   AST 13 0 - 37 U/L   ALT 20 0 - 53 U/L   Alkaline Phosphatase 65 39 - 117 U/L   Total Bilirubin 0.5 0.3 - 1.2 mg/dL   GFR calc non Af Amer 34 (L) >90 mL/min   GFR calc Af Amer 40 (L) >90 mL/min    Comment: (NOTE) The eGFR has been calculated using the CKD EPI equation. This calculation has not been validated in all clinical situations. eGFR's persistently <90 mL/min signify possible Chronic Kidney Disease.    Anion gap 11 5 - 15  Differential     Status: Abnormal   Collection Time: 09/01/14 12:33 AM  Result Value Ref Range   Neutrophils Relative % 94 (H) 43 - 77 %   Neutro Abs 19.7 (H) 1.7 - 7.7 K/uL   Lymphocytes Relative 3 (L) 12 - 46 %   Lymphs Abs 0.7 0.7 - 4.0 K/uL   Monocytes Relative 3 3 - 12 %   Monocytes Absolute 0.7 0.1 -  1.0 K/uL   Eosinophils Relative 0 0 - 5 %   Eosinophils Absolute 0.1 0.0 - 0.7 K/uL   Basophils Relative 0 0 - 1 %   Basophils Absolute 0.0 0.0 - 0.1 K/uL  Glucose, capillary     Status: Abnormal   Collection Time: 09/01/14  4:49 AM  Result Value Ref Range   Glucose-Capillary 200 (H) 70 - 99 mg/dL  Glucose, capillary     Status: Abnormal   Collection  Time: 09/01/14  8:26 AM  Result Value Ref Range   Glucose-Capillary 204 (H) 70 - 99 mg/dL  Glucose, capillary     Status: Abnormal   Collection Time: 09/01/14 12:10 PM  Result Value Ref Range   Glucose-Capillary 258 (H) 70 - 99 mg/dL    MICRO:  IMAGING: Dg Chest Portable 1 View  08/30/2014   CLINICAL DATA:  Bilateral neck pain  EXAM: PORTABLE CHEST - 1 VIEW  COMPARISON:  03/30/2011  FINDINGS: Normal mediastinum and cardiac silhouette. Normal pulmonary vasculature. No evidence of effusion, infiltrate, or pneumothorax. No acute bony abnormality.  IMPRESSION: No acute cardiopulmonary process.   Electronically Signed   By: Suzy Bouchard M.D.   On: 08/30/2014 22:46    Assessment/Plan:  74yo M with HTN, DM, CK3, MS, sacral decub due to immobility admitted for sepsis found to have staph aureus bacteremia  - recommend to change IV abtx to cefazolin plus vancomycin for now, we will narrow once sensitivities return - recommend repeat blood cx - would get TTE for now, but optimally TEE to evaluate for endocarditis - agree with plan to get imaging of hip to see if signs of osteomyelitis - neck pain is concerning that he may have myositis, cervical discitis due to staph aureus bacteremia. Would recommend mri imaging of cervical spine - would call wound care for treatment recs of sacral decub ulcer and consider also contacting dr. [name] from plastics on monday to evaluate mri imaging of pelvis and assessment to see if needs any debridement.        [facility] Staphylococcus aureus bacteremia   Staphylococcus aureus bacteremia (SAB) is associated with a high rate of complications and mortality.  Specific aspects of clinical management are critical to optimizing the outcome of patients with SAB.  Therefore, the [facility] Antimicrobial Management Team [facility]) has initiated an intervention aimed at improving the management of SAB at [facility].  To do so, Infectious  Diseases physicians are providing an evidence-based consult for the management of all patients with SAB.     Yes No Comments  Perform follow-up blood cultures (even if the patient is afebrile) to ensure clearance of bacteremia _0  _1  Will do today       Perform echocardiography to evaluate for endocarditis (transthoracic ECHO is 40-50% sensitive, TEE is > 90% sensitive) _2  _3  Will order today       Ensure source control _4  _5  Agree with getting hip imaging  Investigate for "metastatic" sites of infection _6  _7  Does the patient have ANY symptom or physical exam finding that would suggest a deeper infection (back or neck pain that may be suggestive of vertebral osteomyelitis or epidural abscess, muscle pain that could be a symptom of pyomyositis)?  Keep in mind that for deep seeded infections MRI imaging with contrast is preferred rather than other often insensitive tests such as plain x-rays, especially early in a patient's presentation.  Change antibiotic therapy to _cefazolin plus vancomycin for now__ _8  _9  Beta-lactam antibiotics are  preferred for MSSA due to higher cure rates.   If on Vancomycin, goal trough should be 15 - 20 mcg/mL  Estimated duration of IV antibiotic therapy:  4-6 wk _0  _1  Consult case management for probably prolonged outpatient IV antibiotic therapy

## 2014-09-02 DIAGNOSIS — A4102 Sepsis due to Methicillin resistant Staphylococcus aureus: Principal | ICD-10-CM

## 2014-09-02 DIAGNOSIS — E1169 Type 2 diabetes mellitus with other specified complication: Secondary | ICD-10-CM

## 2014-09-02 DIAGNOSIS — I10 Essential (primary) hypertension: Secondary | ICD-10-CM

## 2014-09-02 DIAGNOSIS — M869 Osteomyelitis, unspecified: Secondary | ICD-10-CM

## 2014-09-02 DIAGNOSIS — A419 Sepsis, unspecified organism: Secondary | ICD-10-CM

## 2014-09-02 DIAGNOSIS — M9080 Osteopathy in diseases classified elsewhere, unspecified site: Secondary | ICD-10-CM

## 2014-09-02 LAB — COMPREHENSIVE METABOLIC PANEL
ALT: 14 U/L — ABNORMAL LOW (ref 17–63)
ANION GAP: 12 (ref 5–15)
AST: 10 U/L — ABNORMAL LOW (ref 15–41)
Albumin: 2 g/dL — ABNORMAL LOW (ref 3.5–5.0)
Alkaline Phosphatase: 68 U/L (ref 38–126)
BUN: 36 mg/dL — ABNORMAL HIGH (ref 6–20)
CALCIUM: 8.1 mg/dL — AB (ref 8.9–10.3)
CO2: 17 mmol/L — ABNORMAL LOW (ref 22–32)
CREATININE: 1.48 mg/dL — AB (ref 0.61–1.24)
Chloride: 107 mmol/L (ref 101–111)
GFR calc Af Amer: 52 mL/min — ABNORMAL LOW (ref >60)
GFR calc non Af Amer: 45 mL/min — ABNORMAL LOW (ref >60)
Glucose, Bld: 264 mg/dL — ABNORMAL HIGH (ref 70–99)
Potassium: 4 mmol/L (ref 3.5–5.1)
SODIUM: 136 mmol/L (ref 135–145)
TOTAL PROTEIN: 5.4 g/dL — AB (ref 6.5–8.1)
Total Bilirubin: 0.7 mg/dL (ref 0.3–1.2)

## 2014-09-02 LAB — CBC WITH DIFFERENTIAL/PLATELET
BASOS ABS: 0 10*3/uL (ref 0.0–0.1)
Basophils Relative: 0 % (ref 0–1)
EOS ABS: 0.6 10*3/uL (ref 0.0–0.7)
EOS PCT: 3 % (ref 0–5)
HCT: 22.5 % — ABNORMAL LOW (ref 39.0–52.0)
Hemoglobin: 7.5 g/dL — ABNORMAL LOW (ref 13.0–17.0)
LYMPHS PCT: 6 % — AB (ref 12–46)
Lymphs Abs: 1.1 10*3/uL (ref 0.7–4.0)
MCH: 30.2 pg (ref 26.0–34.0)
MCHC: 33.3 g/dL (ref 30.0–36.0)
MCV: 90.7 fL (ref 78.0–100.0)
Monocytes Absolute: 0.6 10*3/uL (ref 0.1–1.0)
Monocytes Relative: 3 % (ref 3–12)
Neutro Abs: 14.9 10*3/uL — ABNORMAL HIGH (ref 1.7–7.7)
Neutrophils Relative %: 88 % — ABNORMAL HIGH (ref 43–77)
Platelets: 281 10*3/uL (ref 150–400)
RBC: 2.48 MIL/uL — AB (ref 4.22–5.81)
RDW: 14.5 % (ref 11.5–15.5)
WBC: 17.1 10*3/uL — AB (ref 4.0–10.5)

## 2014-09-02 LAB — RESPIRATORY VIRUS PANEL
Adenovirus: NEGATIVE
INFLUENZA B 1: NEGATIVE
Influenza A: NEGATIVE
Metapneumovirus: NEGATIVE
Parainfluenza 1: NEGATIVE
Parainfluenza 2: NEGATIVE
Parainfluenza 3: NEGATIVE
RESPIRATORY SYNCYTIAL VIRUS A: NEGATIVE
Respiratory Syncytial Virus B: NEGATIVE
Rhinovirus: NEGATIVE

## 2014-09-02 LAB — C-REACTIVE PROTEIN: CRP: 15.7 mg/dL — ABNORMAL HIGH (ref <0.60)

## 2014-09-02 LAB — CULTURE, BLOOD (ROUTINE X 2)

## 2014-09-02 LAB — GLUCOSE, CAPILLARY
GLUCOSE-CAPILLARY: 254 mg/dL — AB (ref 70–99)
GLUCOSE-CAPILLARY: 161 mg/dL — AB (ref 70–99)
Glucose-Capillary: 303 mg/dL — ABNORMAL HIGH (ref 70–99)
Glucose-Capillary: 238 mg/dL — ABNORMAL HIGH (ref 70–99)

## 2014-09-02 NOTE — Progress Notes (Signed)
CRITICAL VALUE ALERT  Critical value received:  blood culture drawn 4/28 is positive for MRSA  Date of notification:  08/30/2014  Time of notification:  0720  Critical value read back: yes  Nurse who received alert: Manya Silvas, RN  MD notified (1st page):  Dr. Wyline Copas  Time of first page:  [phone]

## 2014-09-02 NOTE — Progress Notes (Signed)
[facility] PROGRESS NOTE  Lenus Dematteo Loconte [id number] [name]: 01-12-1941 DOA: 08/30/2014 PCP: [name], MD  Assessment/Plan: 1. Sepsis with MRSA bacteremia 1. Thus far, blood cx with 2/2 Staph aureus in blood 2. UA unremarkable for UTI 3. CXR clear 4. Respiratory virus panel is negative 5. Neurology was initially consulted on admit for ? Meningitis. No further neuro intervention was recommended 6. Pt was initially continued on empiric ampicillin, cefepime, and vanc 7. ID has been consulted, recs for cefazolin with vancomycin and repeat blood cx 8. 2d echo was ordered to r/o vegetations, pending results 9. ID recs for MRI of neck to r/o cervical discitis - ordered and pending 10. Pt with stage 4 sacral decub ulcer noted with active drainage. MRI of pelvis reveals worsened decub ulcer with osteomyelitis involving L ischium 11. Pt is known to Dr. [name]. Will formally consult in AM for recs 2. DM2 1. Poorly controlled glucose 2. A1c of 9.5. Pt claimed recent A1c 3 weeks ago was around 7 3. No evidence of DKA 4. Increased levemir to 12 units with 5 units pre-meal insulin 5. Cont on SSI coverage 3. Acute on CKD3 1. Pt was continued on 125cc/hr of NS, decreased to 75cc/hr 2. Renal function improving 4. Anemia 1. Presenting hgb of 6.8 2. Repeat hgb of 8.5 w/o transfusion 3. Baseline hgb around 7-8 4. Pt's stool is heme pos 5. Serial h/h have remained stable thus far. Cont to monitor for now 5. MS 1. Neurology was consulted per above 6. DVT prophylaxis 1. SCD's  Code Status: Full Family Communication: Pt in room Disposition Plan: Pending   Consultants:  Neurology  ID  Procedures:    Antibiotics:  Ampicillin 4/29>>>4/30  Vanc 4/29>>>  Cefepime 4/29>>> 4/30  Cefazolin 4/30>>>  HPI/Subjective: Pt states feeling better overall than yesterday. No complaints.  Objective: Filed Vitals:   09/02/14 0507 09/02/14 0839 09/02/14 1234 09/02/14 1630   BP: 155/65 153/65 136/57 137/76  Pulse: 101 96 86 82  Temp: 98.6 F (37 C) 98 F (36.7 C) 98.2 F (36.8 C) 97.5 F (36.4 C)  TempSrc: Oral Oral Oral Oral  Resp: 18 17 18 17   Height:      Weight:      SpO2: 94% 95% 96% 98%    Intake/Output Summary (Last 24 hours) at 09/02/14 1652 Last data filed at 09/02/14 1351  Gross per 24 hour  Intake    702 ml  Output   1225 ml  Net   -523 ml   Filed Weights   08/30/14 2324 08/31/14 1202 09/01/14 2208  Weight: 95.255 kg (210 lb) 88.4 kg (194 lb 14.2 oz) 92.126 kg (203 lb 1.6 oz)    Exam:   General:  Awake, laying in bed, in nad  Cardiovascular: regular, s1, s2  Respiratory: normal resp effort, no wheezing  Abdomen: soft,nondistended, obese, nondistended  Musculoskeletal: perfused, no clubbing   Data Reviewed: Basic Metabolic Panel:  Recent Labs Lab 08/30/14 2222 08/31/14 0509 09/01/14 0033 09/02/14 0815  NA 130* 132* 138 136  K 4.5 3.8 4.3 4.0  CL 99 107 108 107  CO2 23 17* 19 17*  GLUCOSE 490* 292* 240* 264*  BUN 66* 58* 53* 36*  CREATININE 1.93* 1.99* 1.85* 1.48*  CALCIUM 9.4 8.2* 8.4 8.1*   Liver Function Tests:  Recent Labs Lab 08/30/14 2222 08/31/14 0509 09/01/14 0033 09/02/14 0815  AST 19 23 13  10*  ALT 35 25 20 14*  ALKPHOS 92 64 65 68  BILITOT 0.6  0.6 0.5 0.7  PROT 7.8 6.2 5.9* 5.4*  ALBUMIN 3.3* 2.5* 2.2* 2.0*   No results for input(s): LIPASE, AMYLASE in the last 168 hours. No results for input(s): AMMONIA in the last 168 hours. CBC:  Recent Labs Lab 08/30/14 2222 08/31/14 0509 08/31/14 1722 08/31/14 2038 09/01/14 0033 09/01/14 1908 09/02/14 0815  WBC 11.5* 20.3*  --   --   --  20.2* 17.1*  NEUTROABS 10.2* 19.2*  --   --  19.7*  --  14.9*  HGB 6.8* 8.5* 7.8* 8.0* 7.8* 7.8* 7.5*  HCT 20.9* 25.5* 23.0* 24.1* 23.6* 23.0* 22.5*  MCV 93.7 91.7  --   --   --  89.8 90.7  PLT 384 247  --   --   --  270 281   Cardiac Enzymes:  Recent Labs Lab 08/30/14 2222  TROPONINI 0.03   BNP  (last 3 results) No results for input(s): BNP in the last 8760 hours.  ProBNP (last 3 results) No results for input(s): PROBNP in the last 8760 hours.  CBG:  Recent Labs Lab 09/01/14 1820 09/01/14 2201 09/02/14 0743 09/02/14 1114 09/02/14 1626  GLUCAP 217* 226* 254* 303* 161*    Recent Results (from the past 240 hour(s))  Urine culture     Status: None   Collection Time: 08/30/14 10:03 PM  Result Value Ref Range Status   Specimen Description URINE, CLEAN CATCH  Final   Special Requests NONE  Final   Colony Count   Final    >=100,000 COLONIES/ML Performed at [facility]    Culture   Final    Multiple bacterial morphotypes present, none predominant. Suggest appropriate recollection if clinically indicated. Performed at [facility]    Report Status 09/01/2014 FINAL  Final  Blood culture (routine x 2)     Status: None   Collection Time: 08/30/14 11:18 PM  Result Value Ref Range Status   Specimen Description BLOOD LEFT WRIST  Final   Special Requests BOTTLES DRAWN AEROBIC AND ANAEROBIC 5CC  Final   Culture   Final    METHICILLIN RESISTANT STAPHYLOCOCCUS AUREUS Note: RIFAMPIN AND GENTAMICIN SHOULD NOT BE USED AS SINGLE DRUGS FOR TREATMENT OF STAPH INFECTIONS. This organism DOES NOT demonstrate inducible Clindamycin resistance in vitro. CRITICAL RESULT CALLED TO, READ BACK BY AND VERIFIED WITH: WENDY G BY INGRAM  A 09/02/14 730AM Note: Gram Stain Report Called to,Read Back By and Verified With: DUSTIN EVERETTE BY INGRAM A 08/31/14 135PM Performed at [facility]    Report Status 09/02/2014 FINAL  Final   Organism ID, Bacteria METHICILLIN RESISTANT STAPHYLOCOCCUS AUREUS  Final      Susceptibility   Methicillin resistant staphylococcus aureus - MIC*    CLINDAMYCIN <=0.25 SENSITIVE Sensitive     ERYTHROMYCIN >=8 RESISTANT Resistant     GENTAMICIN <=0.5 SENSITIVE Sensitive     LEVOFLOXACIN >=8 RESISTANT Resistant     OXACILLIN >=4 RESISTANT Resistant      PENICILLIN >=0.5 RESISTANT Resistant     RIFAMPIN <=0.5 SENSITIVE Sensitive     TRIMETH/SULFA >=320 RESISTANT Resistant     VANCOMYCIN 1 SENSITIVE Sensitive     TETRACYCLINE <=1 SENSITIVE Sensitive     * METHICILLIN RESISTANT STAPHYLOCOCCUS AUREUS  Blood culture (routine x 2)     Status: None   Collection Time: 08/30/14 11:30 PM  Result Value Ref Range Status   Specimen Description BLOOD RIGHT HAND  Final   Special Requests BOTTLES DRAWN AEROBIC AND ANAEROBIC 4CC  Final  Culture   Final    STAPHYLOCOCCUS AUREUS Note: SUSCEPTIBILITIES PERFORMED ON PREVIOUS CULTURE WITHIN THE LAST 5 DAYS. Note: Gram Stain Report Called to,Read Back By and Verified With: DUSTIN EVERETTE 4.29.16 135PM BY AI Performed at [facility]    Report Status 09/02/2014 FINAL  Final  Respiratory virus panel     Status: None   Collection Time: 08/31/14 10:06 AM  Result Value Ref Range Status   Respiratory Syncytial Virus A Negative Negative Final   Respiratory Syncytial Virus B Negative Negative Final   Influenza A Negative Negative Final   Influenza B Negative Negative Final   Parainfluenza 1 Negative Negative Final   Parainfluenza 2 Negative Negative Final   Parainfluenza 3 Negative Negative Final   Metapneumovirus Negative Negative Final   Rhinovirus Negative Negative Final   Adenovirus Negative Negative Final    Comment: (NOTE) Performed At: [facility] [place], [place] [id number] Lindon Romp MD [id number]   MRSA PCR Screening     Status: None   Collection Time: 08/31/14  3:28 PM  Result Value Ref Range Status   MRSA by PCR NEGATIVE NEGATIVE Final    Comment:        The GeneXpert MRSA Assay (FDA approved for NASAL specimens only), is one component of a comprehensive MRSA colonization surveillance program. It is not intended to diagnose MRSA infection nor to guide or monitor treatment for MRSA infections.      Studies: Mr Pelvis W Wo  Contrast  09/02/2014   CLINICAL DATA:  Stage IV sacral decubitus ulcer with active drainage. Evaluate healing and assess for underlying abscess/ osteomyelitis. Subsequent encounter.  EXAM: MRI PELVIS WITHOUT AND WITH CONTRAST  TECHNIQUE: Multiplanar multisequence MR imaging of the pelvis was performed both before and after administration of intravenous contrast.  CONTRAST:  66mL MULTIHANCE GADOBENATE DIMEGLUMINE 529 MG/ML IV SOLN  COMPARISON:  MRI 04/25/2014, radiographs 04/05/2014 and CT 03/31/2011.  FINDINGS: There is increased ulceration posterior to the left ischium with associated soft tissue emphysema. The previously demonstrated inflammatory changes within the subcutaneous fat along the right aspect of the sacrum and coccyx have nearly resolved. There is no focal fluid collection in this area or residual skin ulceration. Chronic changes within the subcutaneous fat posterior to both femoral greater trochanters are incompletely visualized due to the field-of-view and patient's size, but do not show any obvious progression. There is no evidence of soft tissue abscess.  The previously demonstrated T2 hyper throughout the right gluteus medius muscle has resolved. Generalized muscular atrophy is again noted.  There is new T2 hyperintensity and marrow enhancement within the left ischium adjacent to the enlarging decubitus ulcer worrisome for osteomyelitis. No other cortical destruction, T1 signal alteration or bone marrow edema identified. Small focus of T2 hyperintensity within the left superior pubic ramus, best seen on coronal image number 20 of series 4, is unchanged. The sacroiliac and hip joints appear normal. Mild T2 hyperintensity deep to the iliacus muscles is similar to the prior study.  Foley catheter is in place. There is diffuse bladder wall thickening. Penile prosthesis noted.  IMPRESSION: 1. Enlarging decubitus ulcer over the left ischium with marrow changes in the bone worrisome for osteomyelitis. No  evidence of soft tissue abscess. 2. Other areas of chronic soft tissue ulceration along the posterior right aspect of the sacrum and posterior to both femoral trochanters are stable to improved.   Electronically Signed   By: Richardean Sale M.D.   On: 09/02/2014 12:10  Scheduled Meds: . aspirin EC  81 mg Oral Daily  .  ceFAZolin (ANCEF) IV  2 g Intravenous 3 times per day  . insulin aspart  0-15 Units Subcutaneous TID WC  . insulin aspart  0-5 Units Subcutaneous QHS  . insulin aspart  5 Units Subcutaneous TID WC  . insulin detemir  12 Units Subcutaneous Daily  . lisinopril  10 mg Oral Daily  . oxybutynin  5 mg Oral BID  . saccharomyces boulardii  250 mg Oral BID  . simvastatin  20 mg Oral QPM  . vancomycin  750 mg Intravenous Q12H   Continuous Infusions: . sodium chloride 75 mL (09/01/14 1830)    Active Problems:   Sepsis   Hyperglycemia   AKI (acute kidney injury)   Blood poisoning   Sarita Hakanson, [facility] Pager [phone]. If 7PM-7AM, please contact night-coverage at [email or web address], password [email or web address] 09/02/2014, 4:52 PM  LOS: 2 days

## 2014-09-02 NOTE — Progress Notes (Signed)
   09/02/14 1234  Vitals  Temp 98.2 F (36.8 C)  Temp Source Oral  BP (!) 136/57 mmHg  BP Location Left Arm  BP Method Automatic  Patient Position (if appropriate) Lying  Pulse Rate 86  Pulse Rate Source Dinamap  Resp 18  Oxygen Therapy  SpO2 96 %  O2 Device Room Air  Pain Assessment  Pain Assessment 0-10  Pain Score 6   Received call from CCMD of pt increased heart rate and possible conversion to afib. Noted on telemetry increased heart rate with frequent PACs, then lack of P wave. Pt stated that he felt that his heart rate was irregular. VS obtained. Will continue to monitor. Bartholomew Crews, RN

## 2014-09-02 NOTE — Progress Notes (Signed)
2D Echocardiogram Complete.  09/02/2014   Samyia Motter [place], RDCS

## 2014-09-03 ENCOUNTER — Inpatient Hospital Stay (HOSPITAL_COMMUNITY): Payer: Medicare Other

## 2014-09-03 DIAGNOSIS — D649 Anemia, unspecified: Secondary | ICD-10-CM

## 2014-09-03 DIAGNOSIS — M86151 Other acute osteomyelitis, right femur: Secondary | ICD-10-CM | POA: Insufficient documentation

## 2014-09-03 DIAGNOSIS — E1165 Type 2 diabetes mellitus with hyperglycemia: Secondary | ICD-10-CM

## 2014-09-03 DIAGNOSIS — B9562 Methicillin resistant Staphylococcus aureus infection as the cause of diseases classified elsewhere: Secondary | ICD-10-CM

## 2014-09-03 DIAGNOSIS — M4628 Osteomyelitis of vertebra, sacral and sacrococcygeal region: Secondary | ICD-10-CM

## 2014-09-03 DIAGNOSIS — E43 Unspecified severe protein-calorie malnutrition: Secondary | ICD-10-CM

## 2014-09-03 DIAGNOSIS — N390 Urinary tract infection, site not specified: Secondary | ICD-10-CM | POA: Insufficient documentation

## 2014-09-03 LAB — CBC WITH DIFFERENTIAL/PLATELET
BASOS PCT: 0 % (ref 0–1)
Basophils Absolute: 0 10*3/uL (ref 0.0–0.1)
EOS PCT: 3 % (ref 0–5)
Eosinophils Absolute: 0.5 10*3/uL (ref 0.0–0.7)
HCT: 22.2 % — ABNORMAL LOW (ref 39.0–52.0)
Hemoglobin: 7.4 g/dL — ABNORMAL LOW (ref 13.0–17.0)
LYMPHS ABS: 1.2 10*3/uL (ref 0.7–4.0)
LYMPHS PCT: 8 % — AB (ref 12–46)
MCH: 30.2 pg (ref 26.0–34.0)
MCHC: 33.3 g/dL (ref 30.0–36.0)
MCV: 90.6 fL (ref 78.0–100.0)
MONO ABS: 0.7 10*3/uL (ref 0.1–1.0)
Monocytes Relative: 5 % (ref 3–12)
NEUTROS PCT: 84 % — AB (ref 43–77)
Neutro Abs: 12.4 10*3/uL — ABNORMAL HIGH (ref 1.7–7.7)
PLATELETS: 288 10*3/uL (ref 150–400)
RBC: 2.45 MIL/uL — ABNORMAL LOW (ref 4.22–5.81)
RDW: 14.4 % (ref 11.5–15.5)
WBC: 14.8 10*3/uL — ABNORMAL HIGH (ref 4.0–10.5)

## 2014-09-03 LAB — COMPREHENSIVE METABOLIC PANEL
ALT: 8 U/L — AB (ref 17–63)
AST: 12 U/L — AB (ref 15–41)
Albumin: 1.9 g/dL — ABNORMAL LOW (ref 3.5–5.0)
Alkaline Phosphatase: 63 U/L (ref 38–126)
Anion gap: 11 (ref 5–15)
BUN: 33 mg/dL — AB (ref 6–20)
CALCIUM: 8.1 mg/dL — AB (ref 8.9–10.3)
CHLORIDE: 108 mmol/L (ref 101–111)
CO2: 17 mmol/L — ABNORMAL LOW (ref 22–32)
CREATININE: 1.47 mg/dL — AB (ref 0.61–1.24)
GFR calc non Af Amer: 46 mL/min — ABNORMAL LOW (ref >60)
GFR, EST AFRICAN AMERICAN: 53 mL/min — AB (ref >60)
Glucose, Bld: 259 mg/dL — ABNORMAL HIGH (ref 70–99)
POTASSIUM: 4 mmol/L (ref 3.5–5.1)
Sodium: 136 mmol/L (ref 135–145)
TOTAL PROTEIN: 5.5 g/dL — AB (ref 6.5–8.1)
Total Bilirubin: 0.8 mg/dL (ref 0.3–1.2)

## 2014-09-03 LAB — GLUCOSE, CAPILLARY
GLUCOSE-CAPILLARY: 146 mg/dL — AB (ref 70–99)
GLUCOSE-CAPILLARY: 129 mg/dL — AB (ref 70–99)
Glucose-Capillary: 241 mg/dL — ABNORMAL HIGH (ref 70–99)
Glucose-Capillary: 276 mg/dL — ABNORMAL HIGH (ref 70–99)

## 2014-09-03 MED ORDER — INSULIN DETEMIR 100 UNIT/ML ~~LOC~~ SOLN
15.0000 [IU] | Freq: Every day | SUBCUTANEOUS | Status: DC
  Administered 2014-09-03 – 2014-09-04 (×2): 15 [IU] via SUBCUTANEOUS
  Filled 2014-09-03 (×2): qty 0.15

## 2014-09-03 MED ORDER — SODIUM CHLORIDE 0.9 % IJ SOLN
10.0000 mL | INTRAMUSCULAR | Status: DC | PRN
  Administered 2014-09-07 (×2): 10 mL
  Filled 2014-09-03 (×2): qty 40

## 2014-09-03 MED ORDER — INSULIN ASPART 100 UNIT/ML ~~LOC~~ SOLN
8.0000 [IU] | Freq: Three times a day (TID) | SUBCUTANEOUS | Status: DC
  Administered 2014-09-03 – 2014-09-07 (×11): 8 [IU] via SUBCUTANEOUS

## 2014-09-03 MED ORDER — GADOBENATE DIMEGLUMINE 529 MG/ML IV SOLN
10.0000 mL | Freq: Once | INTRAVENOUS | Status: AC | PRN
Start: 2014-09-03 — End: 2014-09-03
  Administered 2014-09-03: 10 mL via INTRAVENOUS

## 2014-09-03 MED ORDER — ACETAMINOPHEN 325 MG PO TABS
325.0000 mg | ORAL_TABLET | ORAL | Status: DC | PRN
  Administered 2014-09-03 – 2014-09-07 (×11): 325 mg via ORAL
  Filled 2014-09-03 (×11): qty 1

## 2014-09-03 NOTE — Progress Notes (Addendum)
INFECTIOUS DISEASE PROGRESS NOTE  ID: Lance [name] is a 74 y.o. male with  Active Problems:   Sepsis   Hyperglycemia   AKI (acute kidney injury)   Blood poisoning  Subjective: Without complaints  Abtx:  Anti-infectives    Start     Dose/Rate Route Frequency Ordered Stop   09/01/14 1700  ceFAZolin (ANCEF) IVPB 2 g/50 mL premix     2 g 100 mL/hr over 30 Minutes Intravenous 3 times per day 09/01/14 1609     09/01/14 1700  vancomycin (VANCOCIN) IVPB 750 mg/150 ml premix     750 mg 150 mL/hr over 60 Minutes Intravenous Every 12 hours 09/01/14 1612     08/31/14 1600  cefTRIAXone (ROCEPHIN) 2 g in dextrose 5 % 50 mL IVPB  Status:  Discontinued     2 g 100 mL/hr over 30 Minutes Intravenous Every 12 hours 08/31/14 0511 09/01/14 0920   08/31/14 1200  vancomycin (VANCOCIN) IVPB 750 mg/150 ml premix  Status:  Discontinued     750 mg 150 mL/hr over 60 Minutes Intravenous Every 12 hours 08/31/14 0511 09/01/14 1607   08/31/14 0800  ampicillin (OMNIPEN) 2 g in sodium chloride 0.9 % 50 mL IVPB  Status:  Discontinued     2 g 150 mL/hr over 20 Minutes Intravenous 6 times per day 08/31/14 0511 09/01/14 0920   08/31/14 0030  cefTRIAXone (ROCEPHIN) 2 g in dextrose 5 % 50 mL IVPB     2 g 100 mL/hr over 30 Minutes Intravenous  Once 08/31/14 0021 08/31/14 0535   08/31/14 0030  vancomycin (VANCOCIN) IVPB 1000 mg/200 mL premix  Status:  Discontinued     1,000 mg 200 mL/hr over 60 Minutes Intravenous  Once 08/31/14 0021 08/31/14 0029   08/31/14 0030  ampicillin (OMNIPEN) 2 g in sodium chloride 0.9 % 50 mL IVPB     2 g 150 mL/hr over 20 Minutes Intravenous  Once 08/31/14 0021 08/31/14 0452   08/30/14 2315  ceFEPIme (MAXIPIME) 2 g in dextrose 5 % 50 mL IVPB     2 g 100 mL/hr over 30 Minutes Intravenous  Once 08/30/14 2307 08/31/14 0031   08/30/14 2315  vancomycin (VANCOCIN) IVPB 1000 mg/200 mL premix     1,000 mg 200 mL/hr over 60 Minutes Intravenous  Once 08/30/14 2308 08/31/14 0227       Medications:  Scheduled: . aspirin EC  81 mg Oral Daily  .  ceFAZolin (ANCEF) IV  2 g Intravenous 3 times per day  . insulin aspart  0-15 Units Subcutaneous TID WC  . insulin aspart  0-5 Units Subcutaneous QHS  . insulin aspart  8 Units Subcutaneous TID WC  . insulin detemir  15 Units Subcutaneous Daily  . lisinopril  10 mg Oral Daily  . oxybutynin  5 mg Oral BID  . saccharomyces boulardii  250 mg Oral BID  . simvastatin  20 mg Oral QPM  . vancomycin  750 mg Intravenous Q12H    Objective: Vital signs in last 24 hours: Temp:  [97.5 F (36.4 C)-99 F (37.2 C)] 98.2 F (36.8 C) (05/02 1000) Pulse Rate:  [82-90] 90 (05/02 1000) Resp:  [16-18] 18 (05/02 1000) BP: (137-160)/(58-81) 144/60 mmHg (05/02 1000) SpO2:  [88 %-98 %] 98 % (05/02 1000) Weight:  [95.6 kg (210 lb 12.2 oz)] 95.6 kg (210 lb 12.2 oz) (05/01 2208)   General appearance: alert, cooperative and no distress Resp: clear to auscultation bilaterally Cardio: regular rate and rhythm GI:  normal findings: bowel sounds normal and soft, non-tender  Lab Results  Recent Labs  09/02/14 0815 09/03/14 0630  WBC 17.1* 14.8*  HGB 7.5* 7.4*  HCT 22.5* 22.2*  NA 136 136  K 4.0 4.0  CL 107 108  CO2 17* 17*  BUN 36* 33*  CREATININE 1.48* 1.47*   Liver Panel  Recent Labs  09/02/14 0815 09/03/14 0630  PROT 5.4* 5.5*  ALBUMIN 2.0* 1.9*  AST 10* 12*  ALT 14* 8*  ALKPHOS 68 63  BILITOT 0.7 0.8   Sedimentation Rate  Recent Labs  09/01/14 1908  ESRSEDRATE 128*   C-Reactive Protein  Recent Labs  09/01/14 1908  CRP 15.7*    Microbiology: Recent Results (from the past 240 hour(s))  Urine culture     Status: None   Collection Time: 08/30/14 10:03 PM  Result Value Ref Range Status   Specimen Description URINE, CLEAN CATCH  Final   Special Requests NONE  Final   Colony Count   Final    >=100,000 COLONIES/ML Performed at [facility]    Culture   Final    Multiple bacterial morphotypes  present, none predominant. Suggest appropriate recollection if clinically indicated. Performed at [facility]    Report Status 09/01/2014 FINAL  Final  Blood culture (routine x 2)     Status: None   Collection Time: 08/30/14 11:18 PM  Result Value Ref Range Status   Specimen Description BLOOD LEFT WRIST  Final   Special Requests BOTTLES DRAWN AEROBIC AND ANAEROBIC 5CC  Final   Culture   Final    METHICILLIN RESISTANT STAPHYLOCOCCUS AUREUS Note: RIFAMPIN AND GENTAMICIN SHOULD NOT BE USED AS SINGLE DRUGS FOR TREATMENT OF STAPH INFECTIONS. This organism DOES NOT demonstrate inducible Clindamycin resistance in vitro. CRITICAL RESULT CALLED TO, READ BACK BY AND VERIFIED WITH: WENDY G BY INGRAM  A 09/02/14 730AM Note: Gram Stain Report Called to,Read Back By and Verified With: DUSTIN EVERETTE BY INGRAM A 08/31/14 135PM Performed at [facility]    Report Status 09/02/2014 FINAL  Final   Organism ID, Bacteria METHICILLIN RESISTANT STAPHYLOCOCCUS AUREUS  Final      Susceptibility   Methicillin resistant staphylococcus aureus - MIC*    CLINDAMYCIN <=0.25 SENSITIVE Sensitive     ERYTHROMYCIN >=8 RESISTANT Resistant     GENTAMICIN <=0.5 SENSITIVE Sensitive     LEVOFLOXACIN >=8 RESISTANT Resistant     OXACILLIN >=4 RESISTANT Resistant     PENICILLIN >=0.5 RESISTANT Resistant     RIFAMPIN <=0.5 SENSITIVE Sensitive     TRIMETH/SULFA >=320 RESISTANT Resistant     VANCOMYCIN 1 SENSITIVE Sensitive     TETRACYCLINE <=1 SENSITIVE Sensitive     * METHICILLIN RESISTANT STAPHYLOCOCCUS AUREUS  Blood culture (routine x 2)     Status: None   Collection Time: 08/30/14 11:30 PM  Result Value Ref Range Status   Specimen Description BLOOD RIGHT HAND  Final   Special Requests BOTTLES DRAWN AEROBIC AND ANAEROBIC 4CC  Final   Culture   Final    STAPHYLOCOCCUS AUREUS Note: SUSCEPTIBILITIES PERFORMED ON PREVIOUS CULTURE WITHIN THE LAST 5 DAYS. Note: Gram Stain Report Called to,Read Back By and  Verified With: DUSTIN EVERETTE 4.29.16 135PM BY AI Performed at [facility]    Report Status 09/02/2014 FINAL  Final  Respiratory virus panel     Status: None   Collection Time: 08/31/14 10:06 AM  Result Value Ref Range Status   Respiratory Syncytial Virus A Negative Negative Final  Respiratory Syncytial Virus B Negative Negative Final   Influenza A Negative Negative Final   Influenza B Negative Negative Final   Parainfluenza 1 Negative Negative Final   Parainfluenza 2 Negative Negative Final   Parainfluenza 3 Negative Negative Final   Metapneumovirus Negative Negative Final   Rhinovirus Negative Negative Final   Adenovirus Negative Negative Final    Comment: (NOTE) Performed At: [facility] [address] [place], [place] [id number] Lindon Romp MD [id number]   MRSA PCR Screening     Status: None   Collection Time: 08/31/14  3:28 PM  Result Value Ref Range Status   MRSA by PCR NEGATIVE NEGATIVE Final    Comment:        The GeneXpert MRSA Assay (FDA approved for NASAL specimens only), is one component of a comprehensive MRSA colonization surveillance program. It is not intended to diagnose MRSA infection nor to guide or monitor treatment for MRSA infections.   Culture, blood (routine x 2)     Status: None (Preliminary result)   Collection Time: 09/01/14  7:08 PM  Result Value Ref Range Status   Specimen Description BLOOD LEFT ANTECUBITAL  Final   Special Requests   Final    BOTTLES DRAWN AEROBIC AND ANAEROBIC 10CC AER 5CC ANA   Culture   Final           BLOOD CULTURE RECEIVED NO GROWTH TO DATE CULTURE WILL BE HELD FOR 5 DAYS BEFORE ISSUING A FINAL NEGATIVE REPORT Performed at [facility]    Report Status PENDING  Incomplete  Culture, blood (routine x 2)     Status: None (Preliminary result)   Collection Time: 09/01/14  7:19 PM  Result Value Ref Range Status   Specimen Description BLOOD LEFT HAND  Final   Special Requests   Final     BOTTLES DRAWN AEROBIC AND ANAEROBIC 2CC ANA 3CC AER   Culture   Final           BLOOD CULTURE RECEIVED NO GROWTH TO DATE CULTURE WILL BE HELD FOR 5 DAYS BEFORE ISSUING A FINAL NEGATIVE REPORT Performed at [facility]    Report Status PENDING  Incomplete    Studies/Results: Mr Cervical Spine W Wo Contrast  09/03/2014   CLINICAL DATA:  Worsening severe neck pain and stiffness. History of multiple sclerosis. Concurrent pelvic osteomyelitis.  EXAM: MRI CERVICAL SPINE WITHOUT AND WITH CONTRAST  TECHNIQUE: Multiplanar and multiecho pulse sequences of the cervical spine, to include the craniocervical junction and cervicothoracic junction, were obtained according to standard protocol without and with intravenous contrast.  CONTRAST:  50mL MULTIHANCE GADOBENATE DIMEGLUMINE 529 MG/ML IV SOLN  COMPARISON:  None.  FINDINGS: Moderately motion degraded examination.  Cervical vertebral bodies appear intact and aligned, straightened cervical lordosis. Moderate to severe C3-4, C5-6 and C6-7 disc height loss, moderate at C4-5. Decreased T2 signal within all cervical discs consistent with moderate desiccation. Moderate subacute to chronic discogenic endplate changes [id number] through C6-7, superimposed mild acute component C3-4. No STIR signal abnormality to suggest fracture. Small T2 facet hemangioma on the LEFT.  Limited assessment of cervical spinal cord signal abnormality due to motion. Mild myelomalacia suspected within the upper thoracic. No abnormal spinal cord, leptomeningeal or epidural enhancement. Included prevertebral and paraspinal soft tissues are nonsuspicious.  Level by level evaluation:  C2-3: Uncovertebral hypertrophy and mild facet arthropathy without canal stenosis or neural foraminal narrowing.  C3-4: Moderate broad-based disc disc bulge, uncovertebral hypertrophy, mild facet arthropathy. Mild canal stenosis. Mild RIGHT,  moderate to severe LEFT neural foraminal narrowing.  C4-5: Small broad-based  disc bulge, uncovertebral hypertrophy and mild facet arthropathy. Mild canal stenosis. Mild RIGHT, severe LEFT neural foraminal narrowing.  C5-6: Small broad-based disc bulge, uncovertebral hypertrophy and mild facet arthropathy result in mild canal stenosis. Moderate to severe bilateral neural foraminal narrowing.  C6-7: Small broad-based disc bulge, uncovertebral hypertrophy and mild facet arthropathy without canal stenosis. Moderate to severe bilateral neural foraminal narrowing.  C7-T1: Annular bulging without canal stenosis or neural foraminal narrowing.  IMPRESSION: Moderately motion degraded examination limits assessment for demyelinating plaques, suspected mild upper thoracic myelomalacia without abnormal cord enhancement to suggest acute inflammation.  Degenerative change of the cervical spine without acute fracture nor malalignment.  Mild canal stenosis C3-4 through C5-6. Neural foraminal narrowing C3-4 thru C6-7: Severe on the LEFT at C4-5, moderate to severe bilaterally at C5-6 and C6-7.   Electronically Signed   By: Elon Alas   On: 09/03/2014 04:19   Mr Pelvis W Wo Contrast  09/02/2014   CLINICAL DATA:  Stage IV sacral decubitus ulcer with active drainage. Evaluate healing and assess for underlying abscess/ osteomyelitis. Subsequent encounter.  EXAM: MRI PELVIS WITHOUT AND WITH CONTRAST  TECHNIQUE: Multiplanar multisequence MR imaging of the pelvis was performed both before and after administration of intravenous contrast.  CONTRAST:  26mL MULTIHANCE GADOBENATE DIMEGLUMINE 529 MG/ML IV SOLN  COMPARISON:  MRI 04/25/2014, radiographs 04/05/2014 and CT 03/31/2011.  FINDINGS: There is increased ulceration posterior to the left ischium with associated soft tissue emphysema. The previously demonstrated inflammatory changes within the subcutaneous fat along the right aspect of the sacrum and coccyx have nearly resolved. There is no focal fluid collection in this area or residual skin ulceration.  Chronic changes within the subcutaneous fat posterior to both femoral greater trochanters are incompletely visualized due to the field-of-view and patient's size, but do not show any obvious progression. There is no evidence of soft tissue abscess.  The previously demonstrated T2 hyper throughout the right gluteus medius muscle has resolved. Generalized muscular atrophy is again noted.  There is new T2 hyperintensity and marrow enhancement within the left ischium adjacent to the enlarging decubitus ulcer worrisome for osteomyelitis. No other cortical destruction, T1 signal alteration or bone marrow edema identified. Small focus of T2 hyperintensity within the left superior pubic ramus, best seen on coronal image number 20 of series 4, is unchanged. The sacroiliac and hip joints appear normal. Mild T2 hyperintensity deep to the iliacus muscles is similar to the prior study.  Foley catheter is in place. There is diffuse bladder wall thickening. Penile prosthesis noted.  IMPRESSION: 1. Enlarging decubitus ulcer over the left ischium with marrow changes in the bone worrisome for osteomyelitis. No evidence of soft tissue abscess. 2. Other areas of chronic soft tissue ulceration along the posterior right aspect of the sacrum and posterior to both femoral trochanters are stable to improved.   Electronically Signed   By: Richardean Sale M.D.   On: 09/02/2014 12:10     Assessment/Plan: MRSA bacteremia Neck Pain- CT no abscess or infection Stage IV scaral decubitus, osteomyelitis Uncontrolled DM2 CKD 3 Protein albumin malnutrition, sever.  Multiple Sclerosis   Total days of antibiotics: 5/42  Repeat BCx 4-30 ngtd D/c ancef Could consider TEE however would suggest he get 6 weeks of vanco for his sacral osteo and bacteremia regardless.  Await TTE Has airflow bed at home Alb is 1.9, continue supplementation To get [facility] Appreciate Dr [name] f/u.  D/i pt, family.  Bobby Rumpf Infectious  Diseases (pager) [phone] [email or web address] 09/03/2014, 2:25 PM  LOS: 3 days

## 2014-09-03 NOTE — Consult Note (Addendum)
WOC consult requested for sacral wound; discussed plan of care with primary team via phone and they have already consulted plastics team for wound assessment and plan of care.  Dr [name] has been following patient prior to admission and pt has osteomyelitis, according to the MRI.  This complex medical condition is beyond [facility] scope of practice. Primary team agrees to cancel WOC consult and will defer to plastics team for further information. Please re-consult if further assistance is needed.  Thank-you,  Julien Girt MSN, [place], [place], [place], [place]

## 2014-09-03 NOTE — Progress Notes (Signed)
Peripherally Inserted Central Catheter/Midline Placement  The IV Nurse has discussed with the patient and/or persons authorized to consent for the patient, the purpose of this procedure and the potential benefits and risks involved with this procedure.  The benefits include less needle sticks, lab draws from the catheter and patient may be discharged home with the catheter.  Risks include, but not limited to, infection, bleeding, blood clot (thrombus formation), and puncture of an artery; nerve damage and irregular heat beat.  Alternatives to this procedure were also discussed.  PICC/Midline Placement Documentation        Lance [name] 09/03/2014, 6:05 PM

## 2014-09-03 NOTE — Progress Notes (Addendum)
[facility] PROGRESS NOTE  Jaycen Lampi Ravenscroft [id number] [name]: [birth year] DOA: 08/30/2014 PCP: [name], MD  Assessment/Plan: 1. Sepsis with MRSA bacteremia 1. Thus far, blood cx with 2/2 MRSA in blood 2. UA unremarkable for UTI 3. CXR clear 4. Respiratory virus panel is negative 5. Neurology was initially consulted on admit for ? Meningitis. No further neuro intervention was recommended 6. Pt was initially continued on empiric ampicillin, cefepime, and vanc 7. ID has been consulted, vancomycin continued with ancef d/c'd 8. Repeat blood cx pending 9. 2d echo was ordered to r/o vegetations, pending results 10. MRI of neck was neg for cervical discitis 11. Pt with stage 4 sacral decub ulcer noted with active drainage. MRI of pelvis reveals worsened decub ulcer with osteomyelitis involving L ischium 12. Pt is known to Dr. [name]. Have discussed case with Dr. [name]. Pt was initially planned for surgical debridement as outpatient at [facility]. Now with tentative plans for surgery at [facility] instead 13. Consideration for TEE, however pt would need 6 weeks of vanc regardless for osteomyelitis 2. DM2 1. Poorly controlled glucose 2. A1c of 9.5. Pt claimed recent A1c 3 weeks ago was around 7 3. No evidence of DKA 4. Glucose remains in the mid 200's 5. Increased levemir to 15 units with 8 units pre-meal insulin 6. Cont on SSI coverage 3. Acute on CKD3 1. Pt was continued on 125cc/hr of NS, decreased to 75cc/hr 2. Renal function improving 4. Anemia 1. Presenting hgb of 6.8 2. Repeat hgb of 8.5 w/o transfusion 3. Baseline hgb around 7-8 4. Pt's stool is heme pos 5. Serial h/h have remained stable thus far. Will cont to monitor for now 5. MS 1. Neurology was consulted per above 6. DVT prophylaxis 1. SCD's  Code Status: Full Family Communication: Pt in room Disposition Plan: Pending   Consultants:  Neurology  ID  Procedures:    Antibiotics:  Ampicillin  4/29>>>4/30  Vanc 4/29>>>  Cefepime 4/29>>> 4/30  Cefazolin 4/30>>>5/2  HPI/Subjective: Patient reports neck stiffness and pain  Objective: Filed Vitals:   09/02/14 1630 09/02/14 2208 09/03/14 0415 09/03/14 1000  BP: 137/76 142/81 160/58 144/60  Pulse: 82 88 82 90  Temp: 97.5 F (36.4 C) 99 F (37.2 C) 98.4 F (36.9 C) 98.2 F (36.8 C)  TempSrc: Oral Oral Oral Oral  Resp: 17 16 18 18   Height:      Weight:  95.6 kg (210 lb 12.2 oz)    SpO2: 98% 88% 97% 98%    Intake/Output Summary (Last 24 hours) at 09/03/14 1723 Last data filed at 09/03/14 1000  Gross per 24 hour  Intake   1790 ml  Output   1275 ml  Net    515 ml   Filed Weights   08/31/14 1202 09/01/14 2208 09/02/14 2208  Weight: 88.4 kg (194 lb 14.2 oz) 92.126 kg (203 lb 1.6 oz) 95.6 kg (210 lb 12.2 oz)    Exam:   General:  Awake, alert, laying in bed, in nad  Cardiovascular: regular, s1, s2  Respiratory: normal resp effort, no wheezing, no crackles  Abdomen: soft,nondistended, obese, nondistended  Musculoskeletal: perfused, no clubbing   Data Reviewed: Basic Metabolic Panel:  Recent Labs Lab 08/30/14 2222 08/31/14 0509 09/01/14 0033 09/02/14 0815 09/03/14 0630  NA 130* 132* 138 136 136  K 4.5 3.8 4.3 4.0 4.0  CL 99 107 108 107 108  CO2 23 17* 19 17* 17*  GLUCOSE 490* 292* 240* 264* 259*  BUN 66* 58* 53* 36*  33*  CREATININE 1.93* 1.99* 1.85* 1.48* 1.47*  CALCIUM 9.4 8.2* 8.4 8.1* 8.1*   Liver Function Tests:  Recent Labs Lab 08/30/14 2222 08/31/14 0509 09/01/14 0033 09/02/14 0815 09/03/14 0630  AST 19 23 13  10* 12*  ALT 35 25 20 14* 8*  ALKPHOS 92 64 65 68 63  BILITOT 0.6 0.6 0.5 0.7 0.8  PROT 7.8 6.2 5.9* 5.4* 5.5*  ALBUMIN 3.3* 2.5* 2.2* 2.0* 1.9*   No results for input(s): LIPASE, AMYLASE in the last 168 hours. No results for input(s): AMMONIA in the last 168 hours. CBC:  Recent Labs Lab 08/30/14 2222 08/31/14 0509  08/31/14 2038 09/01/14 0033 09/01/14 1908  09/02/14 0815 09/03/14 0630  WBC 11.5* 20.3*  --   --   --  20.2* 17.1* 14.8*  NEUTROABS 10.2* 19.2*  --   --  19.7*  --  14.9* 12.4*  HGB 6.8* 8.5*  < > 8.0* 7.8* 7.8* 7.5* 7.4*  HCT 20.9* 25.5*  < > 24.1* 23.6* 23.0* 22.5* 22.2*  MCV 93.7 91.7  --   --   --  89.8 90.7 90.6  PLT 384 247  --   --   --  270 281 288  < > = values in this interval not displayed. Cardiac Enzymes:  Recent Labs Lab 08/30/14 2222  TROPONINI 0.03   BNP (last 3 results) No results for input(s): BNP in the last 8760 hours.  ProBNP (last 3 results) No results for input(s): PROBNP in the last 8760 hours.  CBG:  Recent Labs Lab 09/02/14 1626 09/02/14 2204 09/03/14 0803 09/03/14 1123 09/03/14 1633  GLUCAP 161* 238* 241* 276* 146*    Recent Results (from the past 240 hour(s))  Urine culture     Status: None   Collection Time: 08/30/14 10:03 PM  Result Value Ref Range Status   Specimen Description URINE, CLEAN CATCH  Final   Special Requests NONE  Final   Colony Count   Final    >=100,000 COLONIES/ML Performed at [facility]    Culture   Final    Multiple bacterial morphotypes present, none predominant. Suggest appropriate recollection if clinically indicated. Performed at [facility]    Report Status 09/01/2014 FINAL  Final  Blood culture (routine x 2)     Status: None   Collection Time: 08/30/14 11:18 PM  Result Value Ref Range Status   Specimen Description BLOOD LEFT WRIST  Final   Special Requests BOTTLES DRAWN AEROBIC AND ANAEROBIC 5CC  Final   Culture   Final    METHICILLIN RESISTANT STAPHYLOCOCCUS AUREUS Note: RIFAMPIN AND GENTAMICIN SHOULD NOT BE USED AS SINGLE DRUGS FOR TREATMENT OF STAPH INFECTIONS. This organism DOES NOT demonstrate inducible Clindamycin resistance in vitro. CRITICAL RESULT CALLED TO, READ BACK BY AND VERIFIED WITH: WENDY G BY INGRAM  A 09/02/14 730AM Note: Gram Stain Report Called to,Read Back By and Verified With: DUSTIN EVERETTE BY INGRAM A  08/31/14 135PM Performed at [facility]    Report Status 09/02/2014 FINAL  Final   Organism ID, Bacteria METHICILLIN RESISTANT STAPHYLOCOCCUS AUREUS  Final      Susceptibility   Methicillin resistant staphylococcus aureus - MIC*    CLINDAMYCIN <=0.25 SENSITIVE Sensitive     ERYTHROMYCIN >=8 RESISTANT Resistant     GENTAMICIN <=0.5 SENSITIVE Sensitive     LEVOFLOXACIN >=8 RESISTANT Resistant     OXACILLIN >=4 RESISTANT Resistant     PENICILLIN >=0.5 RESISTANT Resistant     RIFAMPIN <=0.5 SENSITIVE Sensitive  TRIMETH/SULFA >=320 RESISTANT Resistant     VANCOMYCIN 1 SENSITIVE Sensitive     TETRACYCLINE <=1 SENSITIVE Sensitive     * METHICILLIN RESISTANT STAPHYLOCOCCUS AUREUS  Blood culture (routine x 2)     Status: None   Collection Time: 08/30/14 11:30 PM  Result Value Ref Range Status   Specimen Description BLOOD RIGHT HAND  Final   Special Requests BOTTLES DRAWN AEROBIC AND ANAEROBIC 4CC  Final   Culture   Final    STAPHYLOCOCCUS AUREUS Note: SUSCEPTIBILITIES PERFORMED ON PREVIOUS CULTURE WITHIN THE LAST 5 DAYS. Note: Gram Stain Report Called to,Read Back By and Verified With: DUSTIN EVERETTE 4.29.16 135PM BY AI Performed at [facility]    Report Status 09/02/2014 FINAL  Final  Respiratory virus panel     Status: None   Collection Time: 08/31/14 10:06 AM  Result Value Ref Range Status   Respiratory Syncytial Virus A Negative Negative Final   Respiratory Syncytial Virus B Negative Negative Final   Influenza A Negative Negative Final   Influenza B Negative Negative Final   Parainfluenza 1 Negative Negative Final   Parainfluenza 2 Negative Negative Final   Parainfluenza 3 Negative Negative Final   Metapneumovirus Negative Negative Final   Rhinovirus Negative Negative Final   Adenovirus Negative Negative Final    Comment: (NOTE) Performed At: [facility] [place], [place] [id number] Lindon Romp MD [id number]   MRSA PCR  Screening     Status: None   Collection Time: 08/31/14  3:28 PM  Result Value Ref Range Status   MRSA by PCR NEGATIVE NEGATIVE Final    Comment:        The GeneXpert MRSA Assay (FDA approved for NASAL specimens only), is one component of a comprehensive MRSA colonization surveillance program. It is not intended to diagnose MRSA infection nor to guide or monitor treatment for MRSA infections.   Culture, blood (routine x 2)     Status: None (Preliminary result)   Collection Time: 09/01/14  7:08 PM  Result Value Ref Range Status   Specimen Description BLOOD LEFT ANTECUBITAL  Final   Special Requests   Final    BOTTLES DRAWN AEROBIC AND ANAEROBIC 10CC AER 5CC ANA   Culture   Final           BLOOD CULTURE RECEIVED NO GROWTH TO DATE CULTURE WILL BE HELD FOR 5 DAYS BEFORE ISSUING A FINAL NEGATIVE REPORT Performed at [facility]    Report Status PENDING  Incomplete  Culture, blood (routine x 2)     Status: None (Preliminary result)   Collection Time: 09/01/14  7:19 PM  Result Value Ref Range Status   Specimen Description BLOOD LEFT HAND  Final   Special Requests   Final    BOTTLES DRAWN AEROBIC AND ANAEROBIC 2CC ANA 3CC AER   Culture   Final           BLOOD CULTURE RECEIVED NO GROWTH TO DATE CULTURE WILL BE HELD FOR 5 DAYS BEFORE ISSUING A FINAL NEGATIVE REPORT Performed at [facility]    Report Status PENDING  Incomplete     Studies: Mr Cervical Spine W Wo Contrast  09/03/2014   CLINICAL DATA:  Worsening severe neck pain and stiffness. History of multiple sclerosis. Concurrent pelvic osteomyelitis.  EXAM: MRI CERVICAL SPINE WITHOUT AND WITH CONTRAST  TECHNIQUE: Multiplanar and multiecho pulse sequences of the cervical spine, to include the craniocervical junction and cervicothoracic junction, were obtained according to standard  protocol without and with intravenous contrast.  CONTRAST:  32mL MULTIHANCE GADOBENATE DIMEGLUMINE 529 MG/ML IV SOLN  COMPARISON:  None.   FINDINGS: Moderately motion degraded examination.  Cervical vertebral bodies appear intact and aligned, straightened cervical lordosis. Moderate to severe C3-4, C5-6 and C6-7 disc height loss, moderate at C4-5. Decreased T2 signal within all cervical discs consistent with moderate desiccation. Moderate subacute to chronic discogenic endplate changes [id number] through C6-7, superimposed mild acute component C3-4. No STIR signal abnormality to suggest fracture. Small T2 facet hemangioma on the LEFT.  Limited assessment of cervical spinal cord signal abnormality due to motion. Mild myelomalacia suspected within the upper thoracic. No abnormal spinal cord, leptomeningeal or epidural enhancement. Included prevertebral and paraspinal soft tissues are nonsuspicious.  Level by level evaluation:  C2-3: Uncovertebral hypertrophy and mild facet arthropathy without canal stenosis or neural foraminal narrowing.  C3-4: Moderate broad-based disc disc bulge, uncovertebral hypertrophy, mild facet arthropathy. Mild canal stenosis. Mild RIGHT, moderate to severe LEFT neural foraminal narrowing.  C4-5: Small broad-based disc bulge, uncovertebral hypertrophy and mild facet arthropathy. Mild canal stenosis. Mild RIGHT, severe LEFT neural foraminal narrowing.  C5-6: Small broad-based disc bulge, uncovertebral hypertrophy and mild facet arthropathy result in mild canal stenosis. Moderate to severe bilateral neural foraminal narrowing.  C6-7: Small broad-based disc bulge, uncovertebral hypertrophy and mild facet arthropathy without canal stenosis. Moderate to severe bilateral neural foraminal narrowing.  C7-T1: Annular bulging without canal stenosis or neural foraminal narrowing.  IMPRESSION: Moderately motion degraded examination limits assessment for demyelinating plaques, suspected mild upper thoracic myelomalacia without abnormal cord enhancement to suggest acute inflammation.  Degenerative change of the cervical spine without acute fracture  nor malalignment.  Mild canal stenosis C3-4 through C5-6. Neural foraminal narrowing C3-4 thru C6-7: Severe on the LEFT at C4-5, moderate to severe bilaterally at C5-6 and C6-7.   Electronically Signed   By: Elon Alas   On: 09/03/2014 04:19   Mr Pelvis W Wo Contrast  09/02/2014   CLINICAL DATA:  Stage IV sacral decubitus ulcer with active drainage. Evaluate healing and assess for underlying abscess/ osteomyelitis. Subsequent encounter.  EXAM: MRI PELVIS WITHOUT AND WITH CONTRAST  TECHNIQUE: Multiplanar multisequence MR imaging of the pelvis was performed both before and after administration of intravenous contrast.  CONTRAST:  91mL MULTIHANCE GADOBENATE DIMEGLUMINE 529 MG/ML IV SOLN  COMPARISON:  MRI 04/25/2014, radiographs 04/05/2014 and CT 03/31/2011.  FINDINGS: There is increased ulceration posterior to the left ischium with associated soft tissue emphysema. The previously demonstrated inflammatory changes within the subcutaneous fat along the right aspect of the sacrum and coccyx have nearly resolved. There is no focal fluid collection in this area or residual skin ulceration. Chronic changes within the subcutaneous fat posterior to both femoral greater trochanters are incompletely visualized due to the field-of-view and patient's size, but do not show any obvious progression. There is no evidence of soft tissue abscess.  The previously demonstrated T2 hyper throughout the right gluteus medius muscle has resolved. Generalized muscular atrophy is again noted.  There is new T2 hyperintensity and marrow enhancement within the left ischium adjacent to the enlarging decubitus ulcer worrisome for osteomyelitis. No other cortical destruction, T1 signal alteration or bone marrow edema identified. Small focus of T2 hyperintensity within the left superior pubic ramus, best seen on coronal image number 20 of series 4, is unchanged. The sacroiliac and hip joints appear normal. Mild T2 hyperintensity deep to the  iliacus muscles is similar to the prior study.  Foley catheter is in  place. There is diffuse bladder wall thickening. Penile prosthesis noted.  IMPRESSION: 1. Enlarging decubitus ulcer over the left ischium with marrow changes in the bone worrisome for osteomyelitis. No evidence of soft tissue abscess. 2. Other areas of chronic soft tissue ulceration along the posterior right aspect of the sacrum and posterior to both femoral trochanters are stable to improved.   Electronically Signed   By: Richardean Sale M.D.   On: 09/02/2014 12:10    Scheduled Meds: . aspirin EC  81 mg Oral Daily  . insulin aspart  0-15 Units Subcutaneous TID WC  . insulin aspart  0-5 Units Subcutaneous QHS  . insulin aspart  8 Units Subcutaneous TID WC  . insulin detemir  15 Units Subcutaneous Daily  . lisinopril  10 mg Oral Daily  . oxybutynin  5 mg Oral BID  . saccharomyces boulardii  250 mg Oral BID  . simvastatin  20 mg Oral QPM  . vancomycin  750 mg Intravenous Q12H   Continuous Infusions: . sodium chloride 75 mL/hr at 09/03/14 0600    Active Problems:   Sepsis   Hyperglycemia   AKI (acute kidney injury)   Blood poisoning   Jaquann Guarisco, [facility] Pager [phone]. If 7PM-7AM, please contact night-coverage at [email or web address], password [facility] 09/03/2014, 5:23 PM  LOS: 3 days

## 2014-09-04 ENCOUNTER — Ambulatory Visit (HOSPITAL_COMMUNITY): Payer: Medicare Other

## 2014-09-04 ENCOUNTER — Other Ambulatory Visit: Payer: Self-pay | Admitting: Plastic Surgery

## 2014-09-04 DIAGNOSIS — L89324 Pressure ulcer of left buttock, stage 4: Secondary | ICD-10-CM

## 2014-09-04 DIAGNOSIS — K5900 Constipation, unspecified: Secondary | ICD-10-CM

## 2014-09-04 DIAGNOSIS — R7881 Bacteremia: Secondary | ICD-10-CM | POA: Insufficient documentation

## 2014-09-04 DIAGNOSIS — L89329 Pressure ulcer of left buttock, unspecified stage: Secondary | ICD-10-CM | POA: Insufficient documentation

## 2014-09-04 LAB — CBC WITH DIFFERENTIAL/PLATELET
Basophils Absolute: 0 10*3/uL (ref 0.0–0.1)
Basophils Relative: 0 % (ref 0–1)
EOS ABS: 0.6 10*3/uL (ref 0.0–0.7)
Eosinophils Relative: 5 % (ref 0–5)
HCT: 23.3 % — ABNORMAL LOW (ref 39.0–52.0)
HEMOGLOBIN: 7.7 g/dL — AB (ref 13.0–17.0)
Lymphocytes Relative: 11 % — ABNORMAL LOW (ref 12–46)
Lymphs Abs: 1.3 10*3/uL (ref 0.7–4.0)
MCH: 29.7 pg (ref 26.0–34.0)
MCHC: 33 g/dL (ref 30.0–36.0)
MCV: 90 fL (ref 78.0–100.0)
MONO ABS: 0.7 10*3/uL (ref 0.1–1.0)
MONOS PCT: 6 % (ref 3–12)
Neutro Abs: 9.5 10*3/uL — ABNORMAL HIGH (ref 1.7–7.7)
Neutrophils Relative %: 78 % — ABNORMAL HIGH (ref 43–77)
Platelets: 312 10*3/uL (ref 150–400)
RBC: 2.59 MIL/uL — ABNORMAL LOW (ref 4.22–5.81)
RDW: 14.4 % (ref 11.5–15.5)
WBC: 12.2 10*3/uL — ABNORMAL HIGH (ref 4.0–10.5)

## 2014-09-04 LAB — GLUCOSE, CAPILLARY
GLUCOSE-CAPILLARY: 138 mg/dL — AB (ref 70–99)
Glucose-Capillary: 233 mg/dL — ABNORMAL HIGH (ref 70–99)
Glucose-Capillary: 226 mg/dL — ABNORMAL HIGH (ref 70–99)
Glucose-Capillary: 169 mg/dL — ABNORMAL HIGH (ref 70–99)

## 2014-09-04 LAB — COMPREHENSIVE METABOLIC PANEL
ALBUMIN: 1.9 g/dL — AB (ref 3.5–5.0)
ALK PHOS: 64 U/L (ref 38–126)
ALT: 8 U/L — ABNORMAL LOW (ref 17–63)
AST: 14 U/L — AB (ref 15–41)
Anion gap: 10 (ref 5–15)
BUN: 34 mg/dL — ABNORMAL HIGH (ref 6–20)
CALCIUM: 8.4 mg/dL — AB (ref 8.9–10.3)
CO2: 21 mmol/L — ABNORMAL LOW (ref 22–32)
Chloride: 105 mmol/L (ref 101–111)
Creatinine, Ser: 1.33 mg/dL — ABNORMAL HIGH (ref 0.61–1.24)
GFR calc non Af Amer: 51 mL/min — ABNORMAL LOW (ref >60)
GFR, EST AFRICAN AMERICAN: 60 mL/min — AB (ref >60)
GLUCOSE: 194 mg/dL — AB (ref 70–99)
POTASSIUM: 3.9 mmol/L (ref 3.5–5.1)
SODIUM: 136 mmol/L (ref 135–145)
TOTAL PROTEIN: 5.6 g/dL — AB (ref 6.5–8.1)
Total Bilirubin: 0.6 mg/dL (ref 0.3–1.2)

## 2014-09-04 LAB — VANCOMYCIN, TROUGH: VANCOMYCIN TR: 30 ug/mL — AB (ref 10.0–20.0)

## 2014-09-04 MED ORDER — VANCOMYCIN HCL IN DEXTROSE 1-5 GM/200ML-% IV SOLN
1000.0000 mg | INTRAVENOUS | Status: DC
  Administered 2014-09-05 – 2014-09-07 (×3): 1000 mg via INTRAVENOUS
  Filled 2014-09-04 (×4): qty 200

## 2014-09-04 MED ORDER — CEFAZOLIN SODIUM-DEXTROSE 2-3 GM-% IV SOLR
2.0000 g | INTRAVENOUS | Status: DC
  Filled 2014-09-04: qty 50

## 2014-09-04 MED ORDER — BISACODYL 10 MG RE SUPP
10.0000 mg | Freq: Once | RECTAL | Status: AC
  Administered 2014-09-04: 10 mg via RECTAL
  Filled 2014-09-04: qty 1

## 2014-09-04 MED ORDER — INSULIN DETEMIR 100 UNIT/ML ~~LOC~~ SOLN
18.0000 [IU] | Freq: Every day | SUBCUTANEOUS | Status: DC
  Filled 2014-09-04: qty 0.18

## 2014-09-04 NOTE — Progress Notes (Signed)
Inpatient Diabetes Program Recommendations  AACE/ADA: New Consensus Statement on Inpatient Glycemic Control (2013)  Target Ranges:  Prepandial:   less than 140 mg/dL      Peak postprandial:   less than 180 mg/dL (1-2 hours)      Critically ill patients:  140 - 180 mg/dL   Results for Lance [name], Lance [name] (MRN [id number]) as of 09/04/2014 10:26  Ref. Range 09/03/2014 08:03 09/03/2014 11:23 09/03/2014 16:33 09/03/2014 20:49 09/04/2014 07:40  Glucose-Capillary Latest Ref Range: 70-99 mg/dL 241 (H) 276 (H) 146 (H) 129 (H) 233 (H)   Diabetes history: DM2 Outpatient Diabetes medications: Amaryl 4 mg QAM, Actos 30 mg daily Current orders for Inpatient glycemic control: Levemir 15 units daily, Novolog 0-15 units TID with meals, Novolog 0-5 units HS, Novolog 8 units TID with meals for meal coverage  Inpatient Diabetes Program Recommendations Insulin - Basal: Please consider increasing Levemir to 17 units daily. Correction (SSI): Please consider increasing Novolog correction to Resistant scale. Insulin - Meal Coverage: Please consider increasing meal coverage to Novolog 10 units TID with meals if patient eats at least 50% of meals.  Thanks, Barnie Alderman, RN, MSN, CCRN, CDE Diabetes Coordinator Inpatient Diabetes Program [phone] (Team Pager from [place] to [place]) [phone] ([place] office) [phone] [facility] office)

## 2014-09-04 NOTE — Progress Notes (Signed)
ANTIBIOTIC CONSULT NOTE - FOLLOW UP  Pharmacy Consult for Vancomycin Indication: MRSA Bacteremia  No Known Allergies  Patient Measurements: Height: 6\' 4"  (193 cm) Weight: 210 lb 12.2 oz (95.6 kg) IBW/kg (Calculated) : 86.8  Vital Signs: Temp: 98.2 F (36.8 C) (05/03 1000) Temp Source: Oral (05/03 1000) BP: 152/69 mmHg (05/03 1000) Pulse Rate: 88 (05/03 1000) Intake/Output from previous day: 05/02 0701 - 05/03 0700 In: 870 [P.O.:720; IV Piggyback:150] Out: 1950 [Urine:1950] Intake/Output from this shift: Total I/O In: 240 [P.O.:240] Out: 1100 [Urine:1100]  Labs:  Recent Labs  09/02/14 0815 09/03/14 0630 09/04/14 0542  WBC 17.1* 14.8* 12.2*  HGB 7.5* 7.4* 7.7*  PLT 281 288 312  CREATININE 1.48* 1.47* 1.33*   Estimated Creatinine Clearance: 60.7 mL/min (by C-G formula based on Cr of 1.33).  Recent Labs  09/04/14 0542  [place] 30*     Microbiology: Recent Results (from the past 720 hour(s))  Urine culture     Status: None   Collection Time: 08/30/14 10:03 PM  Result Value Ref Range Status   Specimen Description URINE, CLEAN CATCH  Final   Special Requests NONE  Final   Colony Count   Final    >=100,000 COLONIES/ML Performed at [facility]    Culture   Final    Multiple bacterial morphotypes present, none predominant. Suggest appropriate recollection if clinically indicated. Performed at [facility]    Report Status 09/01/2014 FINAL  Final  Blood culture (routine x 2)     Status: None   Collection Time: 08/30/14 11:18 PM  Result Value Ref Range Status   Specimen Description BLOOD LEFT WRIST  Final   Special Requests BOTTLES DRAWN AEROBIC AND ANAEROBIC 5CC  Final   Culture   Final    METHICILLIN RESISTANT STAPHYLOCOCCUS AUREUS Note: RIFAMPIN AND GENTAMICIN SHOULD NOT BE USED AS SINGLE DRUGS FOR TREATMENT OF STAPH INFECTIONS. This organism DOES NOT demonstrate inducible Clindamycin resistance in vitro. CRITICAL RESULT CALLED TO,  READ BACK BY AND VERIFIED WITH: WENDY G BY INGRAM  A 09/02/14 730AM Note: Gram Stain Report Called to,Read Back By and Verified With: DUSTIN EVERETTE BY INGRAM A 08/31/14 135PM Performed at [facility]    Report Status 09/02/2014 FINAL  Final   Organism ID, Bacteria METHICILLIN RESISTANT STAPHYLOCOCCUS AUREUS  Final      Susceptibility   Methicillin resistant staphylococcus aureus - MIC*    CLINDAMYCIN <=0.25 SENSITIVE Sensitive     ERYTHROMYCIN >=8 RESISTANT Resistant     GENTAMICIN <=0.5 SENSITIVE Sensitive     LEVOFLOXACIN >=8 RESISTANT Resistant     OXACILLIN >=4 RESISTANT Resistant     PENICILLIN >=0.5 RESISTANT Resistant     RIFAMPIN <=0.5 SENSITIVE Sensitive     TRIMETH/SULFA >=320 RESISTANT Resistant     VANCOMYCIN 1 SENSITIVE Sensitive     TETRACYCLINE <=1 SENSITIVE Sensitive     * METHICILLIN RESISTANT STAPHYLOCOCCUS AUREUS  Blood culture (routine x 2)     Status: None   Collection Time: 08/30/14 11:30 PM  Result Value Ref Range Status   Specimen Description BLOOD RIGHT HAND  Final   Special Requests BOTTLES DRAWN AEROBIC AND ANAEROBIC 4CC  Final   Culture   Final    STAPHYLOCOCCUS AUREUS Note: SUSCEPTIBILITIES PERFORMED ON PREVIOUS CULTURE WITHIN THE LAST 5 DAYS. Note: Gram Stain Report Called to,Read Back By and Verified With: DUSTIN EVERETTE 4.29.16 135PM BY AI Performed at [facility]    Report Status 09/02/2014 FINAL  Final  Respiratory  virus panel     Status: None   Collection Time: 08/31/14 10:06 AM  Result Value Ref Range Status   Respiratory Syncytial Virus A Negative Negative Final   Respiratory Syncytial Virus B Negative Negative Final   Influenza A Negative Negative Final   Influenza B Negative Negative Final   Parainfluenza 1 Negative Negative Final   Parainfluenza 2 Negative Negative Final   Parainfluenza 3 Negative Negative Final   Metapneumovirus Negative Negative Final   Rhinovirus Negative Negative Final   Adenovirus Negative  Negative Final    Comment: (NOTE) Performed At: [facility] [place], [place] [id number] Lindon Romp MD [id number]   MRSA PCR Screening     Status: None   Collection Time: 08/31/14  3:28 PM  Result Value Ref Range Status   MRSA by PCR NEGATIVE NEGATIVE Final    Comment:        The GeneXpert MRSA Assay (FDA approved for NASAL specimens only), is one component of a comprehensive MRSA colonization surveillance program. It is not intended to diagnose MRSA infection nor to guide or monitor treatment for MRSA infections.   Culture, blood (routine x 2)     Status: None (Preliminary result)   Collection Time: 09/01/14  7:08 PM  Result Value Ref Range Status   Specimen Description BLOOD LEFT ANTECUBITAL  Final   Special Requests   Final    BOTTLES DRAWN AEROBIC AND ANAEROBIC 10CC AER 5CC ANA   Culture   Final           BLOOD CULTURE RECEIVED NO GROWTH TO DATE CULTURE WILL BE HELD FOR 5 DAYS BEFORE ISSUING A FINAL NEGATIVE REPORT Performed at [facility]    Report Status PENDING  Incomplete  Culture, blood (routine x 2)     Status: None (Preliminary result)   Collection Time: 09/01/14  7:19 PM  Result Value Ref Range Status   Specimen Description BLOOD LEFT HAND  Final   Special Requests   Final    BOTTLES DRAWN AEROBIC AND ANAEROBIC 2CC ANA 3CC AER   Culture   Final           BLOOD CULTURE RECEIVED NO GROWTH TO DATE CULTURE WILL BE HELD FOR 5 DAYS BEFORE ISSUING A FINAL NEGATIVE REPORT Performed at [facility]    Report Status PENDING  Incomplete    Anti-infectives    Start     Dose/Rate Route Frequency Ordered Stop   09/05/14 0600  ceFAZolin (ANCEF) IVPB 2 g/50 mL premix     2 g 100 mL/hr over 30 Minutes Intravenous On call to O.R. 09/04/14 1231 09/06/14 0559   09/01/14 1700  ceFAZolin (ANCEF) IVPB 2 g/50 mL premix  Status:  Discontinued     2 g 100 mL/hr over 30 Minutes Intravenous 3 times per day 09/01/14 1609 09/03/14  1456   09/01/14 1700  vancomycin (VANCOCIN) IVPB 750 mg/150 ml premix  Status:  Discontinued     750 mg 150 mL/hr over 60 Minutes Intravenous Every 12 hours 09/01/14 1612 09/04/14 0708   08/31/14 1600  cefTRIAXone (ROCEPHIN) 2 g in dextrose 5 % 50 mL IVPB  Status:  Discontinued     2 g 100 mL/hr over 30 Minutes Intravenous Every 12 hours 08/31/14 0511 09/01/14 0920   08/31/14 1200  vancomycin (VANCOCIN) IVPB 750 mg/150 ml premix  Status:  Discontinued     750 mg 150 mL/hr over 60 Minutes Intravenous Every 12 hours 08/31/14 0511 09/01/14  1607   08/31/14 0800  ampicillin (OMNIPEN) 2 g in sodium chloride 0.9 % 50 mL IVPB  Status:  Discontinued     2 g 150 mL/hr over 20 Minutes Intravenous 6 times per day 08/31/14 0511 09/01/14 0920   08/31/14 0030  cefTRIAXone (ROCEPHIN) 2 g in dextrose 5 % 50 mL IVPB     2 g 100 mL/hr over 30 Minutes Intravenous  Once 08/31/14 0021 08/31/14 0535   08/31/14 0030  vancomycin (VANCOCIN) IVPB 1000 mg/200 mL premix  Status:  Discontinued     1,000 mg 200 mL/hr over 60 Minutes Intravenous  Once 08/31/14 0021 08/31/14 0029   08/31/14 0030  ampicillin (OMNIPEN) 2 g in sodium chloride 0.9 % 50 mL IVPB     2 g 150 mL/hr over 20 Minutes Intravenous  Once 08/31/14 0021 08/31/14 0452   08/30/14 2315  ceFEPIme (MAXIPIME) 2 g in dextrose 5 % 50 mL IVPB     2 g 100 mL/hr over 30 Minutes Intravenous  Once 08/30/14 2307 08/31/14 0031   08/30/14 2315  vancomycin (VANCOCIN) IVPB 1000 mg/200 mL premix     1,000 mg 200 mL/hr over 60 Minutes Intravenous  Once 08/30/14 2308 08/31/14 0227      Assessment: 32 YOM who continues on Vancomycin for MRSA bacteremia thought to have potentially seeded from sacral decubitus ulcer - with imaging showing osteo. A Vancomycin trough this morning resulted as SUPRAtherapeutic (VT 30 mcg/ml, goal of 15-20 mcg/ml). Renal function has been slowly improving - SCr 1.33, CrCl~50-60 ml/min.   After the Vancomycin trough was drawn this morning, the  RN already gave the scheduled Vancomycin dose. Due to this - will hold for ~20 hours to allow the level to trend down to the therapeutic range. Will plan to resume with once daily dosing starting on 5/4.   Goal of Therapy:  Vancomycin trough level 15-20 mcg/ml  Plan:  1. Decrease Vancomycin to 1g IV every 24 hours (starting on 5/4 @ 0200) 2. Will plan to check a new trough level at steady state 3. Will continue to follow renal function, culture results, LOT, and antibiotic de-escalation plans   Alycia Rossetti, PharmD, BCPS Clinical Pharmacist Pager: [phone] 09/04/2014 1:32 PM

## 2014-09-04 NOTE — Progress Notes (Addendum)
[facility] PROGRESS NOTE  Lance [name] [id number] [name]: 1940-12-23 DOA: 08/30/2014 PCP: Lance Shelling, MD   Off Service Summary: 74yo with hx of MS, DM and sacral decub ulcer followed by Dr. Migdalia [name] presents with sepsis from MRSA bacteremia from worsening sacral decub ulcer and osteomyelitis. ID was consulted with recommendations for 6 weeks of vanc. The case was discussed with Dr. Migdalia [name] who is planning for surgical debridement on 5/4. PICC has been placed.  Assessment/Plan: 1. Sepsis with MRSA bacteremia 1. Thus far, blood cx with 2/2 MRSA in blood 2. UA unremarkable for UTI 3. CXR clear 4. Respiratory virus panel is negative 5. Neurology was initially consulted on admit for ? Meningitis. No further neuro intervention was recommended 6. Pt was initially continued on empiric ampicillin, cefepime, and vanc 7. ID has been consulted, vancomycin continued with ancef d/c'd 8. 2d echo was without vegetations 9. MRI of neck was neg for cervical discitis 10. Pt with stage 4 sacral decub ulcer noted with active drainage. MRI of pelvis reveals worsened decub ulcer with osteomyelitis involving L ischium 11. Pt is known to Dr. Migdalia [name]. Have discussed case with Dr. Migdalia [name]. Pt was initially planned for surgical debridement as outpatient at [facility]. Now with plans for surgery at [facility] on 5/4 12. Consideration for TEE, however pt would need 6 weeks of vanc regardless for osteomyelitis 13. ID recs for 36 more days of IV vanc as of today 2. DM2 1. Poorly controlled glucose 2. A1c of 9.5. Pt claimed recent A1c 3 weeks ago was around 7 3. No evidence of DKA 4. Glucose remains in the mid 200's 5. On levemir at 15 units with 8 units pre-meal insulin 6. Cont on SSI coverage 7. Glucose still in the 200 range 8. Will increase levemir to 18 units 3. Acute on CKD3 1. Pt was continued on 125cc/hr of NS, decreased to 75cc/hr 2. Renal function improving 4. Recent MRSA UTI 1. Discussed case with  PCP 2. Pt had recent urine cx that has now grown MRSA 3. Per above, pt is continued on IV vanc 5. Anemia 1. Presenting hgb of 6.8 2. Repeat hgb of 8.5 w/o transfusion 3. Baseline hgb around 7-8 4. Pt's stool is heme pos 5. Serial h/h have remained stable thus far. Will cont to monitor for now 6. MS 1. Neurology was consulted per above 7. DVT prophylaxis 1. SCD's  Code Status: Full Family Communication: Pt in room Disposition Plan: Pending   Consultants:  Neurology  ID  Procedures:    Antibiotics:  Ampicillin 4/29>>>4/30  Vanc 4/29>>> (36 remaining days)  Cefepime 4/29>>> 4/30  Cefazolin 4/30>>>5/2  HPI/Subjective: States neck is less painful today  Objective: Filed Vitals:   09/03/14 2103 09/04/14 0553 09/04/14 1000 09/04/14 1732  BP: 137/66 149/67 152/69 144/65  Pulse: 77 94 88 88  Temp: 98.2 F (36.8 C) 98.7 F (37.1 C) 98.2 F (36.8 C) 98.8 F (37.1 C)  TempSrc: Oral Oral Oral Oral  Resp: 17 18 18 18   Height:      Weight:      SpO2: 99% 97% 98% 98%    Intake/Output Summary (Last 24 hours) at 09/04/14 1851 Last data filed at 09/04/14 1734  Gross per 24 hour  Intake    720 ml  Output   2425 ml  Net  -1705 ml   Filed Weights   08/31/14 1202 09/01/14 2208 09/02/14 2208  Weight: 88.4 kg (194 lb 14.2 oz) 92.126 kg (203 lb 1.6 oz) 95.6 kg (  210 lb 12.2 oz)    Exam:   General:  Awake, alert, laying in bed, in nad  Cardiovascular: regular, s1, s2  Respiratory: normal resp effort, no wheezing,  Abdomen: soft,nondistended, obese, nondistended, pos BS  Musculoskeletal: perfused, no clubbing , no cyanosis  Data Reviewed: Basic Metabolic Panel:  Recent Labs Lab 08/31/14 0509 09/01/14 0033 09/02/14 0815 09/03/14 0630 09/04/14 0542  NA 132* 138 136 136 136  K 3.8 4.3 4.0 4.0 3.9  CL 107 108 107 108 105  CO2 17* 19 17* 17* 21*  GLUCOSE 292* 240* 264* 259* 194*  BUN 58* 53* 36* 33* 34*  CREATININE 1.99* 1.85* 1.48* 1.47* 1.33*   CALCIUM 8.2* 8.4 8.1* 8.1* 8.4*   Liver Function Tests:  Recent Labs Lab 08/31/14 0509 09/01/14 0033 09/02/14 0815 09/03/14 0630 09/04/14 0542  AST 23 13 10* 12* 14*  ALT 25 20 14* 8* 8*  ALKPHOS 64 65 68 63 64  BILITOT 0.6 0.5 0.7 0.8 0.6  PROT 6.2 5.9* 5.4* 5.5* 5.6*  ALBUMIN 2.5* 2.2* 2.0* 1.9* 1.9*   No results for input(s): LIPASE, AMYLASE in the last 168 hours. No results for input(s): AMMONIA in the last 168 hours. CBC:  Recent Labs Lab 08/31/14 0509  09/01/14 0033 09/01/14 1908 09/02/14 0815 09/03/14 0630 09/04/14 0542  WBC 20.3*  --   --  20.2* 17.1* 14.8* 12.2*  NEUTROABS 19.2*  --  19.7*  --  14.9* 12.4* 9.5*  HGB 8.5*  < > 7.8* 7.8* 7.5* 7.4* 7.7*  HCT 25.5*  < > 23.6* 23.0* 22.5* 22.2* 23.3*  MCV 91.7  --   --  89.8 90.7 90.6 90.0  PLT 247  --   --  270 281 288 312  < > = values in this interval not displayed. Cardiac Enzymes:  Recent Labs Lab 08/30/14 2222  TROPONINI 0.03   BNP (last 3 results) No results for input(s): BNP in the last 8760 hours.  ProBNP (last 3 results) No results for input(s): PROBNP in the last 8760 hours.  CBG:  Recent Labs Lab 09/03/14 1633 09/03/14 2049 09/04/14 0740 09/04/14 1146 09/04/14 1630  GLUCAP 146* 129* 233* 226* 169*    Recent Results (from the past 240 hour(s))  Urine culture     Status: None   Collection Time: 08/30/14 10:03 PM  Result Value Ref Range Status   Specimen Description URINE, CLEAN CATCH  Final   Special Requests NONE  Final   Colony Count   Final    >=100,000 COLONIES/ML Performed at [facility]    Culture   Final    Multiple bacterial morphotypes present, none predominant. Suggest appropriate recollection if clinically indicated. Performed at [facility]    Report Status 09/01/2014 FINAL  Final  Blood culture (routine x 2)     Status: None   Collection Time: 08/30/14 11:18 PM  Result Value Ref Range Status   Specimen Description BLOOD LEFT WRIST  Final    Special Requests BOTTLES DRAWN AEROBIC AND ANAEROBIC 5CC  Final   Culture   Final    METHICILLIN RESISTANT STAPHYLOCOCCUS AUREUS Note: RIFAMPIN AND GENTAMICIN SHOULD NOT BE USED AS SINGLE DRUGS FOR TREATMENT OF STAPH INFECTIONS. This organism DOES NOT demonstrate inducible Clindamycin resistance in vitro. CRITICAL RESULT CALLED TO, READ BACK BY AND VERIFIED WITH: WENDY G BY INGRAM  A 09/02/14 730AM Note: Gram Stain Report Called to,Read Back By and Verified With: DUSTIN EVERETTE BY INGRAM A 08/31/14 135PM Performed at [facility]    Report Status 09/02/2014 FINAL  Final   Organism ID, Bacteria METHICILLIN RESISTANT STAPHYLOCOCCUS AUREUS  Final      Susceptibility   Methicillin resistant staphylococcus aureus - MIC*    CLINDAMYCIN <=0.25 SENSITIVE Sensitive     ERYTHROMYCIN >=8 RESISTANT Resistant     GENTAMICIN <=0.5 SENSITIVE Sensitive     LEVOFLOXACIN >=8 RESISTANT Resistant     OXACILLIN >=4 RESISTANT Resistant     PENICILLIN >=0.5 RESISTANT Resistant     RIFAMPIN <=0.5 SENSITIVE Sensitive     TRIMETH/SULFA >=320 RESISTANT Resistant     VANCOMYCIN 1 SENSITIVE Sensitive     TETRACYCLINE <=1 SENSITIVE Sensitive     * METHICILLIN RESISTANT STAPHYLOCOCCUS AUREUS  Blood culture (routine x 2)     Status: None   Collection Time: 08/30/14 11:30 PM  Result Value Ref Range Status   Specimen Description BLOOD RIGHT HAND  Final   Special Requests BOTTLES DRAWN AEROBIC AND ANAEROBIC 4CC  Final   Culture   Final    STAPHYLOCOCCUS AUREUS Note: SUSCEPTIBILITIES PERFORMED ON PREVIOUS CULTURE WITHIN THE LAST 5 DAYS. Note: Gram Stain Report Called to,Read Back By and Verified With: DUSTIN EVERETTE 4.29.16 135PM BY AI Performed at [facility]    Report Status 09/02/2014 FINAL  Final  Respiratory virus panel     Status: None   Collection Time: 08/31/14 10:06 AM  Result Value Ref Range Status   Respiratory Syncytial Virus A Negative Negative Final   Respiratory Syncytial Virus B  Negative Negative Final   Influenza A Negative Negative Final   Influenza B Negative Negative Final   Parainfluenza 1 Negative Negative Final   Parainfluenza 2 Negative Negative Final   Parainfluenza 3 Negative Negative Final   Metapneumovirus Negative Negative Final   Rhinovirus Negative Negative Final   Adenovirus Negative Negative Final    Comment: (NOTE) Performed At: [facility] [place], [place] [id number] Lindon Romp MD [id number]   MRSA PCR Screening     Status: None   Collection Time: 08/31/14  3:28 PM  Result Value Ref Range Status   MRSA by PCR NEGATIVE NEGATIVE Final    Comment:        The GeneXpert MRSA Assay (FDA approved for NASAL specimens only), is one component of a comprehensive MRSA colonization surveillance program. It is not intended to diagnose MRSA infection nor to guide or monitor treatment for MRSA infections.   Culture, blood (routine x 2)     Status: None (Preliminary result)   Collection Time: 09/01/14  7:08 PM  Result Value Ref Range Status   Specimen Description BLOOD LEFT ANTECUBITAL  Final   Special Requests   Final    BOTTLES DRAWN AEROBIC AND ANAEROBIC 10CC AER 5CC ANA   Culture   Final           BLOOD CULTURE RECEIVED NO GROWTH TO DATE CULTURE WILL BE HELD FOR 5 DAYS BEFORE ISSUING A FINAL NEGATIVE REPORT Performed at [facility]    Report Status PENDING  Incomplete  Culture, blood (routine x 2)     Status: None (Preliminary result)   Collection Time: 09/01/14  7:19 PM  Result Value Ref Range Status   Specimen Description BLOOD LEFT HAND  Final   Special Requests   Final    BOTTLES DRAWN AEROBIC AND ANAEROBIC 2CC ANA 3CC AER   Culture   Final           BLOOD CULTURE  RECEIVED NO GROWTH TO DATE CULTURE WILL BE HELD FOR 5 DAYS BEFORE ISSUING A FINAL NEGATIVE REPORT Performed at [facility]    Report Status PENDING  Incomplete     Studies: Mr Cervical Spine W Wo Contrast  09/03/2014    CLINICAL DATA:  Worsening severe neck pain and stiffness. History of multiple sclerosis. Concurrent pelvic osteomyelitis.  EXAM: MRI CERVICAL SPINE WITHOUT AND WITH CONTRAST  TECHNIQUE: Multiplanar and multiecho pulse sequences of the cervical spine, to include the craniocervical junction and cervicothoracic junction, were obtained according to standard protocol without and with intravenous contrast.  CONTRAST:  15mL MULTIHANCE GADOBENATE DIMEGLUMINE 529 MG/ML IV SOLN  COMPARISON:  None.  FINDINGS: Moderately motion degraded examination.  Cervical vertebral bodies appear intact and aligned, straightened cervical lordosis. Moderate to severe C3-4, C5-6 and C6-7 disc height loss, moderate at C4-5. Decreased T2 signal within all cervical discs consistent with moderate desiccation. Moderate subacute to chronic discogenic endplate changes [id number] through C6-7, superimposed mild acute component C3-4. No STIR signal abnormality to suggest fracture. Small T2 facet hemangioma on the LEFT.  Limited assessment of cervical spinal cord signal abnormality due to motion. Mild myelomalacia suspected within the upper thoracic. No abnormal spinal cord, leptomeningeal or epidural enhancement. Included prevertebral and paraspinal soft tissues are nonsuspicious.  Level by level evaluation:  C2-3: Uncovertebral hypertrophy and mild facet arthropathy without canal stenosis or neural foraminal narrowing.  C3-4: Moderate broad-based disc disc bulge, uncovertebral hypertrophy, mild facet arthropathy. Mild canal stenosis. Mild RIGHT, moderate to severe LEFT neural foraminal narrowing.  C4-5: Small broad-based disc bulge, uncovertebral hypertrophy and mild facet arthropathy. Mild canal stenosis. Mild RIGHT, severe LEFT neural foraminal narrowing.  C5-6: Small broad-based disc bulge, uncovertebral hypertrophy and mild facet arthropathy result in mild canal stenosis. Moderate to severe bilateral neural foraminal narrowing.  C6-7: Small broad-based  disc bulge, uncovertebral hypertrophy and mild facet arthropathy without canal stenosis. Moderate to severe bilateral neural foraminal narrowing.  C7-T1: Annular bulging without canal stenosis or neural foraminal narrowing.  IMPRESSION: Moderately motion degraded examination limits assessment for demyelinating plaques, suspected mild upper thoracic myelomalacia without abnormal cord enhancement to suggest acute inflammation.  Degenerative change of the cervical spine without acute fracture nor malalignment.  Mild canal stenosis C3-4 through C5-6. Neural foraminal narrowing C3-4 thru C6-7: Severe on the LEFT at C4-5, moderate to severe bilaterally at C5-6 and C6-7.   Electronically Signed   By: Elon Alas   On: 09/03/2014 04:19    Scheduled Meds: . aspirin EC  81 mg Oral Daily  . insulin aspart  0-15 Units Subcutaneous TID WC  . insulin aspart  0-5 Units Subcutaneous QHS  . insulin aspart  8 Units Subcutaneous TID WC  . insulin detemir  15 Units Subcutaneous Daily  . lisinopril  10 mg Oral Daily  . oxybutynin  5 mg Oral BID  . saccharomyces boulardii  250 mg Oral BID  . simvastatin  20 mg Oral QPM  . [START ON 09/05/2014] vancomycin  1,000 mg Intravenous Q24H   Continuous Infusions: . sodium chloride 75 mL/hr at 09/03/14 0600    Active Problems:   Sepsis   Hyperglycemia   AKI (acute kidney injury)   Blood poisoning   Absolute anemia   Osteomyelitis of pelvis   Urinary tract infectious disease   Candise Crabtree, [facility] Pager [phone]. If 7PM-7AM, please contact night-coverage at [email or web address], password [email or web address] 09/04/2014, 6:51 PM  LOS: 4 days

## 2014-09-04 NOTE — Progress Notes (Signed)
CRITICAL VALUE ALERT  Critical value received:  Vanc trough 30  Date of notification:  09/04/14  Time of notification:  0705  Critical value read back:Yes.    Nurse who received alert:  Larena Glassman, RN  MD notified (1st page):  Dr. Wyline Copas  Time of first page:  [place] also notified of critical levels.   Joellen Jersey, RN.

## 2014-09-04 NOTE — Progress Notes (Addendum)
INFECTIOUS DISEASE PROGRESS NOTE  ID: Lance [name] is a 74 y.o. male with  Active Problems:   Sepsis   Hyperglycemia   AKI (acute kidney injury)   Blood poisoning   Absolute anemia   Osteomyelitis of pelvis   Urinary tract infectious disease  Subjective: C/o constipation.   Abtx:  Anti-infectives    Start     Dose/Rate Route Frequency Ordered Stop   09/05/14 0600  ceFAZolin (ANCEF) IVPB 2 g/50 mL premix     2 g 100 mL/hr over 30 Minutes Intravenous On call to O.R. 09/04/14 1231 09/06/14 0559   09/05/14 0200  vancomycin (VANCOCIN) IVPB 1000 mg/200 mL premix     1,000 mg 200 mL/hr over 60 Minutes Intravenous Every 24 hours 09/04/14 1333     09/01/14 1700  ceFAZolin (ANCEF) IVPB 2 g/50 mL premix  Status:  Discontinued     2 g 100 mL/hr over 30 Minutes Intravenous 3 times per day 09/01/14 1609 09/03/14 1456   09/01/14 1700  vancomycin (VANCOCIN) IVPB 750 mg/150 ml premix  Status:  Discontinued     750 mg 150 mL/hr over 60 Minutes Intravenous Every 12 hours 09/01/14 1612 09/04/14 0708   08/31/14 1600  cefTRIAXone (ROCEPHIN) 2 g in dextrose 5 % 50 mL IVPB  Status:  Discontinued     2 g 100 mL/hr over 30 Minutes Intravenous Every 12 hours 08/31/14 0511 09/01/14 0920   08/31/14 1200  vancomycin (VANCOCIN) IVPB 750 mg/150 ml premix  Status:  Discontinued     750 mg 150 mL/hr over 60 Minutes Intravenous Every 12 hours 08/31/14 0511 09/01/14 1607   08/31/14 0800  ampicillin (OMNIPEN) 2 g in sodium chloride 0.9 % 50 mL IVPB  Status:  Discontinued     2 g 150 mL/hr over 20 Minutes Intravenous 6 times per day 08/31/14 0511 09/01/14 0920   08/31/14 0030  cefTRIAXone (ROCEPHIN) 2 g in dextrose 5 % 50 mL IVPB     2 g 100 mL/hr over 30 Minutes Intravenous  Once 08/31/14 0021 08/31/14 0535   08/31/14 0030  vancomycin (VANCOCIN) IVPB 1000 mg/200 mL premix  Status:  Discontinued     1,000 mg 200 mL/hr over 60 Minutes Intravenous  Once 08/31/14 0021 08/31/14 0029   08/31/14 0030   ampicillin (OMNIPEN) 2 g in sodium chloride 0.9 % 50 mL IVPB     2 g 150 mL/hr over 20 Minutes Intravenous  Once 08/31/14 0021 08/31/14 0452   08/30/14 2315  ceFEPIme (MAXIPIME) 2 g in dextrose 5 % 50 mL IVPB     2 g 100 mL/hr over 30 Minutes Intravenous  Once 08/30/14 2307 08/31/14 0031   08/30/14 2315  vancomycin (VANCOCIN) IVPB 1000 mg/200 mL premix     1,000 mg 200 mL/hr over 60 Minutes Intravenous  Once 08/30/14 2308 08/31/14 0227      Medications:  Scheduled: . aspirin EC  81 mg Oral Daily  . bisacodyl  10 mg Rectal Once  . [START ON 09/05/2014]  ceFAZolin (ANCEF) IV  2 g Intravenous On Call to OR  . insulin aspart  0-15 Units Subcutaneous TID WC  . insulin aspart  0-5 Units Subcutaneous QHS  . insulin aspart  8 Units Subcutaneous TID WC  . insulin detemir  15 Units Subcutaneous Daily  . lisinopril  10 mg Oral Daily  . oxybutynin  5 mg Oral BID  . saccharomyces boulardii  250 mg Oral BID  . simvastatin  20 mg Oral  QPM  . [START ON 09/05/2014] vancomycin  1,000 mg Intravenous Q24H    Objective: Vital signs in last 24 hours: Temp:  [98.2 F (36.8 C)-98.7 F (37.1 C)] 98.2 F (36.8 C) (05/03 1000) Pulse Rate:  [77-94] 88 (05/03 1000) Resp:  [17-18] 18 (05/03 1000) BP: (137-152)/(66-69) 152/69 mmHg (05/03 1000) SpO2:  [97 %-99 %] 98 % (05/03 1000)   General appearance: alert, cooperative and no distress Resp: clear to auscultation bilaterally Cardio: regular rate and rhythm GI: normal findings: bowel sounds normal and soft, non-tender Extremities: edema none RUE PIC clean.   Lab Results  Recent Labs  09/03/14 0630 09/04/14 0542  WBC 14.8* 12.2*  HGB 7.4* 7.7*  HCT 22.2* 23.3*  NA 136 136  K 4.0 3.9  CL 108 105  CO2 17* 21*  BUN 33* 34*  CREATININE 1.47* 1.33*   Liver Panel  Recent Labs  09/03/14 0630 09/04/14 0542  PROT 5.5* 5.6*  ALBUMIN 1.9* 1.9*  AST 12* 14*  ALT 8* 8*  ALKPHOS 63 64  BILITOT 0.8 0.6   Sedimentation Rate  Recent Labs   09/01/14 1908  ESRSEDRATE 128*   C-Reactive Protein  Recent Labs  09/01/14 1908  CRP 15.7*    Microbiology: Recent Results (from the past 240 hour(s))  Urine culture     Status: None   Collection Time: 08/30/14 10:03 PM  Result Value Ref Range Status   Specimen Description URINE, CLEAN CATCH  Final   Special Requests NONE  Final   Colony Count   Final    >=100,000 COLONIES/ML Performed at [facility]    Culture   Final    Multiple bacterial morphotypes present, none predominant. Suggest appropriate recollection if clinically indicated. Performed at [facility]    Report Status 09/01/2014 FINAL  Final  Blood culture (routine x 2)     Status: None   Collection Time: 08/30/14 11:18 PM  Result Value Ref Range Status   Specimen Description BLOOD LEFT WRIST  Final   Special Requests BOTTLES DRAWN AEROBIC AND ANAEROBIC 5CC  Final   Culture   Final    METHICILLIN RESISTANT STAPHYLOCOCCUS AUREUS Note: RIFAMPIN AND GENTAMICIN SHOULD NOT BE USED AS SINGLE DRUGS FOR TREATMENT OF STAPH INFECTIONS. This organism DOES NOT demonstrate inducible Clindamycin resistance in vitro. CRITICAL RESULT CALLED TO, READ BACK BY AND VERIFIED WITH: WENDY G BY INGRAM  A 09/02/14 730AM Note: Gram Stain Report Called to,Read Back By and Verified With: DUSTIN EVERETTE BY INGRAM A 08/31/14 135PM Performed at [facility]    Report Status 09/02/2014 FINAL  Final   Organism ID, Bacteria METHICILLIN RESISTANT STAPHYLOCOCCUS AUREUS  Final      Susceptibility   Methicillin resistant staphylococcus aureus - MIC*    CLINDAMYCIN <=0.25 SENSITIVE Sensitive     ERYTHROMYCIN >=8 RESISTANT Resistant     GENTAMICIN <=0.5 SENSITIVE Sensitive     LEVOFLOXACIN >=8 RESISTANT Resistant     OXACILLIN >=4 RESISTANT Resistant     PENICILLIN >=0.5 RESISTANT Resistant     RIFAMPIN <=0.5 SENSITIVE Sensitive     TRIMETH/SULFA >=320 RESISTANT Resistant     VANCOMYCIN 1 SENSITIVE Sensitive      TETRACYCLINE <=1 SENSITIVE Sensitive     * METHICILLIN RESISTANT STAPHYLOCOCCUS AUREUS  Blood culture (routine x 2)     Status: None   Collection Time: 08/30/14 11:30 PM  Result Value Ref Range Status   Specimen Description BLOOD RIGHT HAND  Final   Special Requests BOTTLES  DRAWN AEROBIC AND ANAEROBIC 4CC  Final   Culture   Final    STAPHYLOCOCCUS AUREUS Note: SUSCEPTIBILITIES PERFORMED ON PREVIOUS CULTURE WITHIN THE LAST 5 DAYS. Note: Gram Stain Report Called to,Read Back By and Verified With: DUSTIN EVERETTE 4.29.16 135PM BY AI Performed at [facility]    Report Status 09/02/2014 FINAL  Final  Respiratory virus panel     Status: None   Collection Time: 08/31/14 10:06 AM  Result Value Ref Range Status   Respiratory Syncytial Virus A Negative Negative Final   Respiratory Syncytial Virus B Negative Negative Final   Influenza A Negative Negative Final   Influenza B Negative Negative Final   Parainfluenza 1 Negative Negative Final   Parainfluenza 2 Negative Negative Final   Parainfluenza 3 Negative Negative Final   Metapneumovirus Negative Negative Final   Rhinovirus Negative Negative Final   Adenovirus Negative Negative Final    Comment: (NOTE) Performed At: [facility] [place], [place] [id number] Lindon Romp MD [id number]   MRSA PCR Screening     Status: None   Collection Time: 08/31/14  3:28 PM  Result Value Ref Range Status   MRSA by PCR NEGATIVE NEGATIVE Final    Comment:        The GeneXpert MRSA Assay (FDA approved for NASAL specimens only), is one component of a comprehensive MRSA colonization surveillance program. It is not intended to diagnose MRSA infection nor to guide or monitor treatment for MRSA infections.   Culture, blood (routine x 2)     Status: None (Preliminary result)   Collection Time: 09/01/14  7:08 PM  Result Value Ref Range Status   Specimen Description BLOOD LEFT ANTECUBITAL  Final   Special Requests    Final    BOTTLES DRAWN AEROBIC AND ANAEROBIC 10CC AER 5CC ANA   Culture   Final           BLOOD CULTURE RECEIVED NO GROWTH TO DATE CULTURE WILL BE HELD FOR 5 DAYS BEFORE ISSUING A FINAL NEGATIVE REPORT Performed at [facility]    Report Status PENDING  Incomplete  Culture, blood (routine x 2)     Status: None (Preliminary result)   Collection Time: 09/01/14  7:19 PM  Result Value Ref Range Status   Specimen Description BLOOD LEFT HAND  Final   Special Requests   Final    BOTTLES DRAWN AEROBIC AND ANAEROBIC 2CC ANA 3CC AER   Culture   Final           BLOOD CULTURE RECEIVED NO GROWTH TO DATE CULTURE WILL BE HELD FOR 5 DAYS BEFORE ISSUING A FINAL NEGATIVE REPORT Performed at [facility]    Report Status PENDING  Incomplete    Studies/Results: Mr Cervical Spine W Wo Contrast  09/03/2014   CLINICAL DATA:  Worsening severe neck pain and stiffness. History of multiple sclerosis. Concurrent pelvic osteomyelitis.  EXAM: MRI CERVICAL SPINE WITHOUT AND WITH CONTRAST  TECHNIQUE: Multiplanar and multiecho pulse sequences of the cervical spine, to include the craniocervical junction and cervicothoracic junction, were obtained according to standard protocol without and with intravenous contrast.  CONTRAST:  36mL MULTIHANCE GADOBENATE DIMEGLUMINE 529 MG/ML IV SOLN  COMPARISON:  None.  FINDINGS: Moderately motion degraded examination.  Cervical vertebral bodies appear intact and aligned, straightened cervical lordosis. Moderate to severe C3-4, C5-6 and C6-7 disc height loss, moderate at C4-5. Decreased T2 signal within all cervical discs consistent with moderate desiccation. Moderate subacute to chronic discogenic endplate changes  C3-4 through C6-7, superimposed mild acute component C3-4. No STIR signal abnormality to suggest fracture. Small T2 facet hemangioma on the LEFT.  Limited assessment of cervical spinal cord signal abnormality due to motion. Mild myelomalacia suspected within the upper  thoracic. No abnormal spinal cord, leptomeningeal or epidural enhancement. Included prevertebral and paraspinal soft tissues are nonsuspicious.  Level by level evaluation:  C2-3: Uncovertebral hypertrophy and mild facet arthropathy without canal stenosis or neural foraminal narrowing.  C3-4: Moderate broad-based disc disc bulge, uncovertebral hypertrophy, mild facet arthropathy. Mild canal stenosis. Mild RIGHT, moderate to severe LEFT neural foraminal narrowing.  C4-5: Small broad-based disc bulge, uncovertebral hypertrophy and mild facet arthropathy. Mild canal stenosis. Mild RIGHT, severe LEFT neural foraminal narrowing.  C5-6: Small broad-based disc bulge, uncovertebral hypertrophy and mild facet arthropathy result in mild canal stenosis. Moderate to severe bilateral neural foraminal narrowing.  C6-7: Small broad-based disc bulge, uncovertebral hypertrophy and mild facet arthropathy without canal stenosis. Moderate to severe bilateral neural foraminal narrowing.  C7-T1: Annular bulging without canal stenosis or neural foraminal narrowing.  IMPRESSION: Moderately motion degraded examination limits assessment for demyelinating plaques, suspected mild upper thoracic myelomalacia without abnormal cord enhancement to suggest acute inflammation.  Degenerative change of the cervical spine without acute fracture nor malalignment.  Mild canal stenosis C3-4 through C5-6. Neural foraminal narrowing C3-4 thru C6-7: Severe on the LEFT at C4-5, moderate to severe bilaterally at C5-6 and C6-7.   Electronically Signed   By: Elon Alas   On: 09/03/2014 04:19     Assessment/Plan: MRSA bacteremia   Repeat BCx 4-30 ngtd. TTE (-) Neck Pain- CT no abscess or infection Stage IV scaral decubitus, osteomyelitis Uncontrolled DM2 CKD 3 Protein albumin malnutrition, severe  Multiple Sclerosis constipation  Total days of antibiotics: 6/42 (vanco)  Would aim to continue vanco for 36 more days Dr [name] for possible  decubitus surgery/VAC in AM.  Dulcolax for constipation.  Can f/u in ID clinic in 4 weeks.  Available if questions.          Bobby Rumpf Infectious Diseases (pager) [phone] [email or web address] 09/04/2014, 2:27 PM  LOS: 4 days

## 2014-09-05 ENCOUNTER — Encounter (HOSPITAL_COMMUNITY)
Admission: EM | Disposition: A | Discharge status: Home / Self Care | Payer: Self-pay | Source: Home / Self Care | Attending: Internal Medicine

## 2014-09-05 ENCOUNTER — Ambulatory Visit (HOSPITAL_BASED_OUTPATIENT_CLINIC_OR_DEPARTMENT_OTHER): Admission: RE | Admit: 2014-09-05 | Payer: Medicare Other | Source: Ambulatory Visit | Admitting: Plastic Surgery

## 2014-09-05 ENCOUNTER — Encounter (HOSPITAL_COMMUNITY): Payer: Self-pay | Admitting: Plastic Surgery

## 2014-09-05 ENCOUNTER — Inpatient Hospital Stay (HOSPITAL_COMMUNITY): Payer: Medicare Other | Admitting: Certified Registered"

## 2014-09-05 DIAGNOSIS — D500 Iron deficiency anemia secondary to blood loss (chronic): Secondary | ICD-10-CM

## 2014-09-05 HISTORY — DX: Type 2 diabetes mellitus without complications: E11.9

## 2014-09-05 HISTORY — DX: Anemia in chronic kidney disease: D63.1

## 2014-09-05 HISTORY — DX: Reserved for concepts with insufficient information to code with codable children: IMO0002

## 2014-09-05 HISTORY — DX: Chronic kidney disease, unspecified: N18.9

## 2014-09-05 HISTORY — DX: Neuromuscular dysfunction of bladder, unspecified: N31.9

## 2014-09-05 HISTORY — DX: Personal history of urinary (tract) infections: Z87.440

## 2014-09-05 HISTORY — PX: INCISION AND DRAINAGE OF WOUND: SHX1803

## 2014-09-05 HISTORY — PX: APPLICATION OF A-CELL OF EXTREMITY: SHX6303

## 2014-09-05 HISTORY — DX: Pressure ulcer of unspecified buttock, unspecified stage: L89.309

## 2014-09-05 HISTORY — DX: Personal history of Methicillin resistant Staphylococcus aureus infection: Z86.14

## 2014-09-05 HISTORY — DX: Other specified health status: Z78.9

## 2014-09-05 HISTORY — DX: Other disturbances of skin sensation: R20.8

## 2014-09-05 LAB — BASIC METABOLIC PANEL
Anion gap: 11 (ref 5–15)
BUN: 35 mg/dL — AB (ref 6–20)
CO2: 21 mmol/L — AB (ref 22–32)
Calcium: 8.5 mg/dL — ABNORMAL LOW (ref 8.9–10.3)
Chloride: 102 mmol/L (ref 101–111)
Creatinine, Ser: 1.31 mg/dL — ABNORMAL HIGH (ref 0.61–1.24)
GFR calc Af Amer: >60 mL/min (ref >60)
GFR calc non Af Amer: 52 mL/min — ABNORMAL LOW (ref >60)
Glucose, Bld: 226 mg/dL — ABNORMAL HIGH (ref 70–99)
Potassium: 3.8 mmol/L (ref 3.5–5.1)
Sodium: 134 mmol/L — ABNORMAL LOW (ref 135–145)

## 2014-09-05 LAB — CBC WITH DIFFERENTIAL/PLATELET
Basophils Absolute: 0 10*3/uL (ref 0.0–0.1)
Basophils Relative: 0 % (ref 0–1)
Eosinophils Absolute: 0.6 10*3/uL (ref 0.0–0.7)
Eosinophils Relative: 5 % (ref 0–5)
HEMATOCRIT: 23.5 % — AB (ref 39.0–52.0)
Hemoglobin: 7.7 g/dL — ABNORMAL LOW (ref 13.0–17.0)
LYMPHS ABS: 1.5 10*3/uL (ref 0.7–4.0)
LYMPHS PCT: 13 % (ref 12–46)
MCH: 29.7 pg (ref 26.0–34.0)
MCHC: 32.8 g/dL (ref 30.0–36.0)
MCV: 90.7 fL (ref 78.0–100.0)
Monocytes Absolute: 0.7 10*3/uL (ref 0.1–1.0)
Monocytes Relative: 6 % (ref 3–12)
NEUTROS ABS: 8.4 10*3/uL — AB (ref 1.7–7.7)
Neutrophils Relative %: 76 % (ref 43–77)
PLATELETS: 312 10*3/uL (ref 150–400)
RBC: 2.59 MIL/uL — AB (ref 4.22–5.81)
RDW: 14.5 % (ref 11.5–15.5)
WBC: 11.1 10*3/uL — AB (ref 4.0–10.5)

## 2014-09-05 LAB — GLUCOSE, CAPILLARY
GLUCOSE-CAPILLARY: 150 mg/dL — AB (ref 70–99)
Glucose-Capillary: 220 mg/dL — ABNORMAL HIGH (ref 70–99)
Glucose-Capillary: 237 mg/dL — ABNORMAL HIGH (ref 70–99)
Glucose-Capillary: 233 mg/dL — ABNORMAL HIGH (ref 70–99)
Glucose-Capillary: 209 mg/dL — ABNORMAL HIGH (ref 70–99)

## 2014-09-05 SURGERY — IRRIGATION AND DEBRIDEMENT WOUND
Anesthesia: General | Laterality: Left

## 2014-09-05 MED ORDER — ROCURONIUM BROMIDE 50 MG/5ML IV SOLN
INTRAVENOUS | Status: AC
  Filled 2014-09-05: qty 1

## 2014-09-05 MED ORDER — INSULIN ASPART 100 UNIT/ML ~~LOC~~ SOLN
SUBCUTANEOUS | Status: AC
  Filled 2014-09-05: qty 5

## 2014-09-05 MED ORDER — GLYCOPYRROLATE 0.2 MG/ML IJ SOLN
INTRAMUSCULAR | Status: AC
  Filled 2014-09-05: qty 2

## 2014-09-05 MED ORDER — ONDANSETRON HCL 4 MG/2ML IJ SOLN
INTRAMUSCULAR | Status: DC | PRN
  Administered 2014-09-05: 4 mg via INTRAVENOUS

## 2014-09-05 MED ORDER — SUFENTANIL CITRATE 50 MCG/ML IV SOLN
INTRAVENOUS | Status: AC
  Filled 2014-09-05: qty 1

## 2014-09-05 MED ORDER — LACTATED RINGERS IV SOLN
INTRAVENOUS | Status: DC | PRN
  Administered 2014-09-05: 11:00:00 via INTRAVENOUS

## 2014-09-05 MED ORDER — ROCURONIUM BROMIDE 100 MG/10ML IV SOLN
INTRAVENOUS | Status: DC | PRN
  Administered 2014-09-05: 40 mg via INTRAVENOUS

## 2014-09-05 MED ORDER — LIDOCAINE-EPINEPHRINE 1 %-1:100000 IJ SOLN
INTRAMUSCULAR | Status: AC
  Filled 2014-09-05: qty 1

## 2014-09-05 MED ORDER — MIDAZOLAM HCL 2 MG/2ML IJ SOLN
INTRAMUSCULAR | Status: AC
  Filled 2014-09-05: qty 2

## 2014-09-05 MED ORDER — MIDAZOLAM HCL 5 MG/5ML IJ SOLN
INTRAMUSCULAR | Status: DC | PRN
  Administered 2014-09-05: 2 mg via INTRAVENOUS

## 2014-09-05 MED ORDER — NEOSTIGMINE METHYLSULFATE 10 MG/10ML IV SOLN
INTRAVENOUS | Status: AC
  Filled 2014-09-05: qty 1

## 2014-09-05 MED ORDER — MEPERIDINE HCL 25 MG/ML IJ SOLN: 6.2500 mg | INTRAMUSCULAR | Status: DC | PRN

## 2014-09-05 MED ORDER — SODIUM CHLORIDE 0.9 % IR SOLN
Status: DC | PRN
  Administered 2014-09-05: 1000 mL

## 2014-09-05 MED ORDER — METHYLENE BLUE 1 % INJ SOLN
INTRAMUSCULAR | Status: AC
  Filled 2014-09-05: qty 10

## 2014-09-05 MED ORDER — SODIUM CHLORIDE 0.9 % IR SOLN
Status: DC | PRN
  Administered 2014-09-05: 500 mL

## 2014-09-05 MED ORDER — SODIUM CHLORIDE 0.9 % IJ SOLN
INTRAMUSCULAR | Status: AC
  Filled 2014-09-05: qty 10

## 2014-09-05 MED ORDER — PROPOFOL 10 MG/ML IV BOLUS
INTRAVENOUS | Status: AC
  Filled 2014-09-05: qty 20

## 2014-09-05 MED ORDER — ONDANSETRON HCL 4 MG/2ML IJ SOLN
INTRAMUSCULAR | Status: AC
  Filled 2014-09-05: qty 2

## 2014-09-05 MED ORDER — SUFENTANIL CITRATE 50 MCG/ML IV SOLN
INTRAVENOUS | Status: DC | PRN
  Administered 2014-09-05 (×2): 10 ug via INTRAVENOUS

## 2014-09-05 MED ORDER — LIDOCAINE HCL (CARDIAC) 20 MG/ML IV SOLN
INTRAVENOUS | Status: AC
  Filled 2014-09-05: qty 5

## 2014-09-05 MED ORDER — LIDOCAINE-EPINEPHRINE 1 %-1:100000 IJ SOLN
INTRAMUSCULAR | Status: DC | PRN
  Administered 2014-09-05: 9 mL

## 2014-09-05 MED ORDER — PROPOFOL 10 MG/ML IV BOLUS
INTRAVENOUS | Status: DC | PRN
  Administered 2014-09-05: 130 mg via INTRAVENOUS

## 2014-09-05 MED ORDER — INSULIN STARTER KIT- PEN NEEDLES (ENGLISH)
1.0000 | Freq: Once | Status: AC
  Administered 2014-09-05: 1
  Filled 2014-09-05: qty 1

## 2014-09-05 MED ORDER — INSULIN DETEMIR 100 UNIT/ML ~~LOC~~ SOLN
22.0000 [IU] | Freq: Every day | SUBCUTANEOUS | Status: DC
  Administered 2014-09-05 – 2014-09-07 (×3): 22 [IU] via SUBCUTANEOUS
  Filled 2014-09-05 (×4): qty 0.22

## 2014-09-05 MED ORDER — NEOSTIGMINE METHYLSULFATE 10 MG/10ML IV SOLN
INTRAVENOUS | Status: DC | PRN
  Administered 2014-09-05: 3 mg via INTRAVENOUS

## 2014-09-05 MED ORDER — HYDROMORPHONE HCL 1 MG/ML IJ SOLN: 0.2500 mg | INTRAMUSCULAR | Status: DC | PRN

## 2014-09-05 MED ORDER — LACTATED RINGERS IV SOLN
INTRAVENOUS | Status: DC
  Administered 2014-09-05: 11:00:00 via INTRAVENOUS

## 2014-09-05 MED ORDER — GLYCOPYRROLATE 0.2 MG/ML IJ SOLN
INTRAMUSCULAR | Status: DC | PRN
  Administered 2014-09-05: 0.4 mg via INTRAVENOUS

## 2014-09-05 MED ORDER — CEFAZOLIN SODIUM-DEXTROSE 2-3 GM-% IV SOLR
2.0000 g | INTRAVENOUS | Status: DC
  Filled 2014-09-05: qty 50

## 2014-09-05 MED ORDER — MIDAZOLAM HCL 2 MG/2ML IJ SOLN: 0.5000 mg | Freq: Once | INTRAMUSCULAR | Status: DC | PRN

## 2014-09-05 MED ORDER — PROMETHAZINE HCL 25 MG/ML IJ SOLN: 6.2500 mg | INTRAMUSCULAR | Status: DC | PRN

## 2014-09-05 SURGICAL SUPPLY — 46 items
BAG DECANTER FOR FLEXI CONT (MISCELLANEOUS) ×3 IMPLANT
BENZOIN TINCTURE PRP APPL 2/3 (GAUZE/BANDAGES/DRESSINGS) ×3 IMPLANT
CANISTER SUCTION 2500CC (MISCELLANEOUS) ×3 IMPLANT
CONT SPEC STER OR (MISCELLANEOUS) ×6 IMPLANT
COVER SURGICAL LIGHT HANDLE (MISCELLANEOUS) ×3 IMPLANT
DRAPE IMP U-DRAPE 54X76 (DRAPES) ×3 IMPLANT
DRAPE INCISE IOBAN 66X45 STRL (DRAPES) ×3 IMPLANT
DRAPE LAPAROSCOPIC ABDOMINAL (DRAPES) ×3 IMPLANT
DRAPE PED LAPAROTOMY (DRAPES) IMPLANT
DRAPE PROXIMA HALF (DRAPES) IMPLANT
DRSG ADAPTIC 3X8 NADH LF (GAUZE/BANDAGES/DRESSINGS) ×3 IMPLANT
DRSG PAD ABDOMINAL 8X10 ST (GAUZE/BANDAGES/DRESSINGS) ×3 IMPLANT
DRSG VAC ATS LRG SENSATRAC (GAUZE/BANDAGES/DRESSINGS) IMPLANT
DRSG VAC ATS MED SENSATRAC (GAUZE/BANDAGES/DRESSINGS) ×3 IMPLANT
DRSG VAC ATS SM SENSATRAC (GAUZE/BANDAGES/DRESSINGS) IMPLANT
ELECT CAUTERY BLADE 6.4 (BLADE) ×3 IMPLANT
ELECT REM PT RETURN 9FT ADLT (ELECTROSURGICAL) ×3
ELECTRODE REM PT RTRN 9FT ADLT (ELECTROSURGICAL) ×1 IMPLANT
GAUZE SPONGE 4X4 12PLY STRL (GAUZE/BANDAGES/DRESSINGS) ×3 IMPLANT
GLOVE BIO SURGEON STRL SZ 6.5 (GLOVE) ×4 IMPLANT
GLOVE BIO SURGEONS STRL SZ 6.5 (GLOVE) ×2
GOWN STRL REUS W/ TWL LRG LVL3 (GOWN DISPOSABLE) ×3 IMPLANT
GOWN STRL REUS W/TWL LRG LVL3 (GOWN DISPOSABLE) ×6
KIT BASIN OR (CUSTOM PROCEDURE TRAY) ×3 IMPLANT
KIT ROOM TURNOVER OR (KITS) ×3 IMPLANT
MATRIX SURGICAL PSM 7X10CM (Tissue) ×3 IMPLANT
MICROMATRIX 1000MG (Tissue) ×3 IMPLANT
NS IRRIG 1000ML POUR BTL (IV SOLUTION) ×3 IMPLANT
PACK GENERAL/GYN (CUSTOM PROCEDURE TRAY) ×3 IMPLANT
PACK ORTHO EXTREMITY (CUSTOM PROCEDURE TRAY) IMPLANT
PACK UNIVERSAL I (CUSTOM PROCEDURE TRAY) ×3 IMPLANT
PAD ARMBOARD 7.5X6 YLW CONV (MISCELLANEOUS) ×6 IMPLANT
SOLUTION PARTIC MCRMTRX 1000MG (Tissue) ×1 IMPLANT
STAPLER VISISTAT 35W (STAPLE) IMPLANT
STOCKINETTE IMPERVIOUS 9X36 MD (GAUZE/BANDAGES/DRESSINGS) IMPLANT
STOCKINETTE IMPERVIOUS LG (DRAPES) IMPLANT
SURGILUBE 2OZ TUBE FLIPTOP (MISCELLANEOUS) ×3 IMPLANT
SUT VIC AB 5-0 PS2 18 (SUTURE) IMPLANT
SWAB COLLECTION DEVICE MRSA (MISCELLANEOUS) IMPLANT
TOWEL OR 17X24 6PK STRL BLUE (TOWEL DISPOSABLE) ×3 IMPLANT
TOWEL OR 17X26 10 PK STRL BLUE (TOWEL DISPOSABLE) ×3 IMPLANT
TUBE ANAEROBIC SPECIMEN COL (MISCELLANEOUS) IMPLANT
TUBE CONNECTING 12'X1/4 (SUCTIONS) ×1
TUBE CONNECTING 12X1/4 (SUCTIONS) ×2 IMPLANT
UNDERPAD 30X30 INCONTINENT (UNDERPADS AND DIAPERS) IMPLANT
YANKAUER SUCT BULB TIP NO VENT (SUCTIONS) ×3 IMPLANT

## 2014-09-05 NOTE — Anesthesia Procedure Notes (Signed)
Procedure Name: Intubation Date/Time: 09/05/2014 11:07 AM Performed by: Claris Che Pre-anesthesia Checklist: Patient identified, Emergency Drugs available, Suction available, Patient being monitored and Timeout performed Patient Re-evaluated:Patient Re-evaluated prior to inductionOxygen Delivery Method: Circle system utilized Preoxygenation: Pre-oxygenation with 100% oxygen Intubation Type: IV induction Ventilation: Mask ventilation without difficulty Laryngoscope Size: Glidescope and 3 Grade View: Grade I Tube type: Oral Tube size: 7.5 mm Number of attempts: 1 Airway Equipment and Method: Stylet Placement Confirmation: ETT inserted through vocal cords under direct vision,  positive ETCO2 and breath sounds checked- equal and bilateral Secured at: 24 cm Tube secured with: Tape Dental Injury: Teeth and Oropharynx as per pre-operative assessment

## 2014-09-05 NOTE — Progress Notes (Signed)
Pt NPO for I&D of wound left buttock area.

## 2014-09-05 NOTE — Transfer of Care (Signed)
Immediate Anesthesia Transfer of Care Note  Patient: Lance [name]  Procedure(s) Performed: Procedure(s): IRRIGATION AND DEBRIDEMENT OF LEFT ISCHIUM WOUND WITH  (Left) PLACEMENT OF ACELL AND VAC (Left)  Patient Location: PACU  Anesthesia Type:General  Level of Consciousness: awake, alert , oriented and patient cooperative  Airway & Oxygen Therapy: Patient Spontanous Breathing and Patient connected to nasal cannula oxygen  Post-op Assessment: Report given to RN, Post -op Vital signs reviewed and stable and Patient moving all extremities X 4  Post vital signs: Reviewed and stable  Last Vitals:  Filed Vitals:   09/05/14 0500  BP: 159/70  Pulse: 89  Temp: 36.9 C  Resp: 18    Complications: No apparent anesthesia complications

## 2014-09-05 NOTE — Brief Op Note (Signed)
08/30/2014 - 09/05/2014  11:01 AM  PATIENT:  Sol Blazing Marcoux  74 y.o. male  PRE-OPERATIVE DIAGNOSIS:  LEFT ISCHIUM ULCER  POST-OPERATIVE DIAGNOSIS:  LEFT ISCHIUM ULCER  PROCEDURE:  Procedure(s): IRRIGATION AND DEBRIDEMENT OF LEFT ISCHIUM WOUND WITH  (Left) PLACEMENT OF ACELL AND VAC (Left)  SURGEON:  Surgeon(s) and Role:    * Claire Sanger, DO - Primary  PHYSICIAN ASSISTANT: Shawn Rayburn, PA  ASSISTANTS: none   ANESTHESIA:   general  EBL:     BLOOD ADMINISTERED:none  DRAINS: none   LOCAL MEDICATIONS USED:  NONE  SPECIMEN:  Source of Specimen:  bone of left ischium  DISPOSITION OF SPECIMEN:  micro  COUNTS:  YES  TOURNIQUET:  * No tourniquets in log *  DICTATION: .Dragon Dictation  PLAN OF CARE: Admit to inpatient   PATIENT DISPOSITION:  PACU - hemodynamically stable.   Delay start of Pharmacological VTE agent (>24hrs) due to surgical blood loss or risk of bleeding: no

## 2014-09-05 NOTE — Progress Notes (Signed)
[facility] PROGRESS NOTE  Lance [name] OTR:[id number] [name]: 1940/05/08 DOA: 08/30/2014 PCP: [name], MD   Off Service Summary: 74yo with hx of MS, DM and sacral decub ulcer followed by Dr. [name] presents with sepsis from MRSA bacteremia from worsening sacral decub ulcer and osteomyelitis. ID was consulted with recommendations for 6 weeks of vanc. The case was discussed with Dr. [name] who is planning for surgical debridement on 5/4. PICC has been placed.  Assessment/Plan: 1. Sepsis with MRSA bacteremia -source Stage 4 sacral decub with osteomyelitis -repeat Blood Cx NGTD, TTE negative for endocarditis' -Followed by ID  -needs 6weeks of IV Vanc, Day 7/42 -also had MRI neck since he complained of neck pain which was unremarkable -for I&d and Wound vac today  2. Stage 4 decub ulcer and osteomyelitis of ischium -for debridement and vac placement per dr.Sanger today -continue IV Vanc  3. DM2 -hbiac 9.5, CBGs in 400s on admission -now on levemir and SSI with meal coverage -increase dose -Insulin teaching, will need to go home on Insulin, DM coordinator following  4. Acute on CKD3 -improved, cut down IVF  5. Recent MRSA UTI -likely from bacteremia -now on IV Vanc as above  6. Chronic Anemia -due to CKD, Sepsis and mild iron deficiency noted on anemia panel -Hb 7-8 range and stable, will need outpatient further workup for anemia  7. Chronic MS -with disability  -bed bound at baseline but transfers using his UE, used to be followed at [facility]  8. DVT prophylaxis -SCDs  Code Status: Full Family Communication: wife and son Disposition Plan: Pending, Home Friday of stable   Consultants:  Neurology  ID  Procedures:    Antibiotics:  Ampicillin 4/29>>>4/30  Vanc 4/29>>> (36 remaining days)  Cefepime 4/29>>> 4/30  Cefazolin 4/30>>>5/2  HPI/Subjective: Awaiting Surgery today  Objective: Filed Vitals:   09/05/14 1208 09/05/14 1238  09/05/14 1300 09/05/14 1330  BP: 136/61 138/57    Pulse:  86 84 78  Temp: 97.6 F (36.4 C)     TempSrc:      Resp:  '18 12 16  ' Height:      Weight:      SpO2:  100% 98% 95%    Intake/Output Summary (Last 24 hours) at 09/05/14 1412 Last data filed at 09/05/14 1340  Gross per 24 hour  Intake 1917.5 ml  Output   2550 ml  Net -632.5 ml   Filed Weights   08/31/14 1202 09/01/14 2208 09/02/14 2208  Weight: 88.4 kg (194 lb 14.2 oz) 92.126 kg (203 lb 1.6 oz) 95.6 kg (210 lb 12.2 oz)    Exam:   General:  Awake, alert, laying in bed, no distress  Cardiovascular: regular, s1, s2/RRR  Respiratory: normal resp effort, no wheezing,  Abdomen: soft,nondistended, obese, nondistended, pos BS  Musculoskeletal: perfused, no clubbing , no cyanosis   Data Reviewed: Basic Metabolic Panel:  Recent Labs Lab 09/01/14 0033 09/02/14 0815 09/03/14 0630 09/04/14 0542 09/05/14 0540  NA 138 136 136 136 134*  K 4.3 4.0 4.0 3.9 3.8  CL 108 107 108 105 102  CO2 19 17* 17* 21* 21*  GLUCOSE 240* 264* 259* 194* 226*  BUN 53* 36* 33* 34* 35*  CREATININE 1.85* 1.48* 1.47* 1.33* 1.31*  CALCIUM 8.4 8.1* 8.1* 8.4* 8.5*   Liver Function Tests:  Recent Labs Lab 08/31/14 0509 09/01/14 0033 09/02/14 0815 09/03/14 0630 09/04/14 0542  AST 23 13 10* 12* 14*  ALT 25 20 14* 8* 8*  ALKPHOS 64  65 68 63 64  BILITOT 0.6 0.5 0.7 0.8 0.6  PROT 6.2 5.9* 5.4* 5.5* 5.6*  ALBUMIN 2.5* 2.2* 2.0* 1.9* 1.9*   No results for input(s): LIPASE, AMYLASE in the last 168 hours. No results for input(s): AMMONIA in the last 168 hours. CBC:  Recent Labs Lab 09/01/14 0033 09/01/14 1908 09/02/14 0815 09/03/14 0630 09/04/14 0542 09/05/14 0540  WBC  --  20.2* 17.1* 14.8* 12.2* 11.1*  NEUTROABS 19.7*  --  14.9* 12.4* 9.5* 8.4*  HGB 7.8* 7.8* 7.5* 7.4* 7.7* 7.7*  HCT 23.6* 23.0* 22.5* 22.2* 23.3* 23.5*  MCV  --  89.8 90.7 90.6 90.0 90.7  PLT  --  270 281 288 312 312   Cardiac Enzymes:  Recent Labs Lab  08/30/14 2222  TROPONINI 0.03   BNP (last 3 results) No results for input(s): BNP in the last 8760 hours.  ProBNP (last 3 results) No results for input(s): PROBNP in the last 8760 hours.  CBG:  Recent Labs Lab 09/04/14 1630 09/04/14 2137 09/05/14 0752 09/05/14 1031 09/05/14 1310  GLUCAP 169* 138* 220* 237* 233*    Recent Results (from the past 240 hour(s))  Urine culture     Status: None   Collection Time: 08/30/14 10:03 PM  Result Value Ref Range Status   Specimen Description URINE, CLEAN CATCH  Final   Special Requests NONE  Final   Colony Count   Final    >=100,000 COLONIES/ML Performed at [facility]    Culture   Final    Multiple bacterial morphotypes present, none predominant. Suggest appropriate recollection if clinically indicated. Performed at [facility]    Report Status 09/01/2014 FINAL  Final  Blood culture (routine x 2)     Status: None   Collection Time: 08/30/14 11:18 PM  Result Value Ref Range Status   Specimen Description BLOOD LEFT WRIST  Final   Special Requests BOTTLES DRAWN AEROBIC AND ANAEROBIC 5CC  Final   Culture   Final    METHICILLIN RESISTANT STAPHYLOCOCCUS AUREUS Note: RIFAMPIN AND GENTAMICIN SHOULD NOT BE USED AS SINGLE DRUGS FOR TREATMENT OF STAPH INFECTIONS. This organism DOES NOT demonstrate inducible Clindamycin resistance in vitro. CRITICAL RESULT CALLED TO, READ BACK BY AND VERIFIED WITH: WENDY G BY INGRAM  A 09/02/14 730AM Note: Gram Stain Report Called to,Read Back By and Verified With: DUSTIN EVERETTE BY INGRAM A 08/31/14 135PM Performed at [facility]    Report Status 09/02/2014 FINAL  Final   Organism ID, Bacteria METHICILLIN RESISTANT STAPHYLOCOCCUS AUREUS  Final      Susceptibility   Methicillin resistant staphylococcus aureus - MIC*    CLINDAMYCIN <=0.25 SENSITIVE Sensitive     ERYTHROMYCIN >=8 RESISTANT Resistant     GENTAMICIN <=0.5 SENSITIVE Sensitive     LEVOFLOXACIN >=8 RESISTANT Resistant      OXACILLIN >=4 RESISTANT Resistant     PENICILLIN >=0.5 RESISTANT Resistant     RIFAMPIN <=0.5 SENSITIVE Sensitive     TRIMETH/SULFA >=320 RESISTANT Resistant     VANCOMYCIN 1 SENSITIVE Sensitive     TETRACYCLINE <=1 SENSITIVE Sensitive     * METHICILLIN RESISTANT STAPHYLOCOCCUS AUREUS  Blood culture (routine x 2)     Status: None   Collection Time: 08/30/14 11:30 PM  Result Value Ref Range Status   Specimen Description BLOOD RIGHT HAND  Final   Special Requests BOTTLES DRAWN AEROBIC AND ANAEROBIC 4CC  Final   Culture   Final    STAPHYLOCOCCUS AUREUS Note: SUSCEPTIBILITIES PERFORMED ON  PREVIOUS CULTURE WITHIN THE LAST 5 DAYS. Note: Gram Stain Report Called to,Read Back By and Verified With: DUSTIN EVERETTE 4.29.16 135PM BY AI Performed at [facility]    Report Status 09/02/2014 FINAL  Final  Respiratory virus panel     Status: None   Collection Time: 08/31/14 10:06 AM  Result Value Ref Range Status   Respiratory Syncytial Virus A Negative Negative Final   Respiratory Syncytial Virus B Negative Negative Final   Influenza A Negative Negative Final   Influenza B Negative Negative Final   Parainfluenza 1 Negative Negative Final   Parainfluenza 2 Negative Negative Final   Parainfluenza 3 Negative Negative Final   Metapneumovirus Negative Negative Final   Rhinovirus Negative Negative Final   Adenovirus Negative Negative Final    Comment: (NOTE) Performed At: [facility] [place], [place] [id number] Lindon Romp MD FO:[id number]   MRSA PCR Screening     Status: None   Collection Time: 08/31/14  3:28 PM  Result Value Ref Range Status   MRSA by PCR NEGATIVE NEGATIVE Final    Comment:        The GeneXpert MRSA Assay (FDA approved for NASAL specimens only), is one component of a comprehensive MRSA colonization surveillance program. It is not intended to diagnose MRSA infection nor to guide or monitor treatment for MRSA infections.    Culture, blood (routine x 2)     Status: None (Preliminary result)   Collection Time: 09/01/14  7:08 PM  Result Value Ref Range Status   Specimen Description BLOOD LEFT ANTECUBITAL  Final   Special Requests   Final    BOTTLES DRAWN AEROBIC AND ANAEROBIC 10CC AER 5CC ANA   Culture   Final           BLOOD CULTURE RECEIVED NO GROWTH TO DATE CULTURE WILL BE HELD FOR 5 DAYS BEFORE ISSUING A FINAL NEGATIVE REPORT Performed at [facility]    Report Status PENDING  Incomplete  Culture, blood (routine x 2)     Status: None (Preliminary result)   Collection Time: 09/01/14  7:19 PM  Result Value Ref Range Status   Specimen Description BLOOD LEFT HAND  Final   Special Requests   Final    BOTTLES DRAWN AEROBIC AND ANAEROBIC 2CC ANA 3CC AER   Culture   Final           BLOOD CULTURE RECEIVED NO GROWTH TO DATE CULTURE WILL BE HELD FOR 5 DAYS BEFORE ISSUING A FINAL NEGATIVE REPORT Performed at [facility]    Report Status PENDING  Incomplete     Studies: No results found.  Scheduled Meds: . [MAR Hold] aspirin EC  81 mg Oral Daily  .  ceFAZolin (ANCEF) IV  2 g Intravenous On Call to OR  . insulin aspart      . [MAR Hold] insulin aspart  0-15 Units Subcutaneous TID WC  . [MAR Hold] insulin aspart  0-5 Units Subcutaneous QHS  . [MAR Hold] insulin aspart  8 Units Subcutaneous TID WC  . [MAR Hold] insulin detemir  22 Units Subcutaneous Daily  . insulin starter kit- pen needles  1 kit Other Once  . [MAR Hold] lisinopril  10 mg Oral Daily  . [MAR Hold] oxybutynin  5 mg Oral BID  . [MAR Hold] saccharomyces boulardii  250 mg Oral BID  . [MAR Hold] simvastatin  20 mg Oral QPM  . [MAR Hold] vancomycin  1,000 mg Intravenous Q24H  Continuous Infusions: . sodium chloride 50 mL/hr (09/05/14 1349)  . lactated ringers 50 mL/hr at 09/05/14 1034    Active Problems:   Sepsis   Hyperglycemia   AKI (acute kidney injury)   Blood poisoning   Absolute anemia   Osteomyelitis of  pelvis   Urinary tract infectious disease   Bacteremia   Left perineal ischial pressure ulcer   [facility] Pager [phone]. If 7PM-7AM, please contact night-coverage at [email or web address], password [facility] 09/05/2014, 2:12 PM  LOS: 5 days

## 2014-09-05 NOTE — Op Note (Signed)
Operative Note   DATE OF OPERATION: 09/05/2014  LOCATION: [facility]  SURGICAL DIVISION: Plastic Surgery  PREOPERATIVE DIAGNOSES:  Left ischial ulcer  POSTOPERATIVE DIAGNOSES:  same  PROCEDURE:  Preparation of left ischial ulcer 4 x 4 x 4.2 cm with debridement of skin, subcutaneous tissue and bone with placement of Acell (7 x 10 cm sheet and 500 mg powder) and the VAC  SURGEON: [employer], DO  ASSISTANT: Shawn Rayburn, PA  ANESTHESIA:  General.   COMPLICATIONS: None.   INDICATIONS FOR PROCEDURE:  The patient, Lance [name] is a 74 y.o. male born on January 26, 1941, is here for treatment of a left ischial ulcer that he has been dealing with conservatively for months.  MRN: [id number]  CONSENT:  Informed consent was obtained directly from the patient. Risks, benefits and alternatives were fully discussed. Specific risks including but not limited to bleeding, infection, hematoma, seroma, scarring, pain, infection, contracture, asymmetry, wound healing problems, and need for further surgery were all discussed. The patient did have an ample opportunity to have questions answered to satisfaction.   DESCRIPTION OF PROCEDURE:  The patient was taken to the operating room. SCDs were placed and IV antibiotics were given. The patient's operative site was prepped and draped in a sterile fashion. A time out was performed and all information was confirmed to be correct.  General anesthesia was administered.  The area was irrigated with antibiotic solution.  The area was debrided sharply with  A #10 blade and bovie.  The ronguer was used to remove a biopsy of bone for gram stain culture and sensitivity.  The acell powder 1 gm and sheet 5 x 8 cm were applied with the adaptic and ky gel.  The vac was placed and we obtained a good seal. The patient tolerated the procedure well.  There were no complications. The patient was allowed to wake from anesthesia, extubated and taken to the  recovery room in satisfactory condition.

## 2014-09-05 NOTE — H&P (Signed)
Lance [name] is an 74 y.o. male.   Chief Complaint: left ischial ulcer HPI: The patient is a 74 yrs old wm here for treatment of his left ischial ulcer.  He is paralyzed and trying to position off the area but without success.  The wound has been treated conservatively without improvement.  The decision was made to come to the OR for debridement and Acell/VAC placement.  Past Medical History  Diagnosis Date  . Hypertension   . Blood transfusion   . Neuralgia neuritis, sciatic nerve     MS for 30 years  . PONV (postoperative nausea and vomiting)   . MS (multiple sclerosis)   . Blood transfusion without reported diagnosis   . CKD (chronic kidney disease), stage III   . Anemia in chronic renal disease   . Chronic spastic paraplegia     secondary to MS  . Self-catheterizes urinary bladder   . Neurogenic bladder   . History of recurrent UTIs   . History of MRSA infection     urine  . Decubitus ulcer of ischium   . Decreased sensation of lower extremity     due to MS  . Type 2 diabetes mellitus     Past Surgical History  Procedure Laterality Date  . Tonsillectomy    . Finger surgery  2004  . Wound debridement    . Extracorporeal shock wave lithotripsy  03-23-2011    Family History  Problem Relation Age of Onset  . Stroke Father   . Diabetes Father    Social History:  reports that he quit smoking about 31 years ago. His smoking use included Cigars. He has never used smokeless tobacco. He reports that he drinks about 3.6 oz of alcohol per week. He reports that he does not use illicit drugs.  Allergies: No Known Allergies  Medications Prior to Admission  Medication Sig Dispense Refill  . Ascorbic Acid (VITAMIN C PO) Take by mouth.    Marland Kitchen aspirin 81 MG tablet Take 1 tablet (81 mg total) by mouth daily. 1 tablet 0  . Cholecalciferol (VITAMIN D3) 2000 UNITS TABS Take 2,000 mg by mouth daily.      . fish oil-omega-3 fatty acids 1000 MG capsule Take 1 capsule (1 g total) by  mouth daily. 1 capsule 0  . glimepiride (AMARYL) 2 MG tablet Take 2 mg by mouth daily with breakfast.    . Liniments (SALONPAS PAIN RELIEF PATCH EX) Apply 1 patch topically daily as needed (pain).    Marland Kitchen lisinopril (PRINIVIL,ZESTRIL) 10 MG tablet Take 10 mg by mouth daily.    . Multiple Vitamin (MULTIVITAMIN WITH MINERALS) TABS tablet Take 1 tablet by mouth daily.    Marland Kitchen oxybutynin (DITROPAN) 5 MG tablet Take 5 mg by mouth 2 (two) times daily.      . pioglitazone (ACTOS) 30 MG tablet Take 30 mg by mouth daily.      . simvastatin (ZOCOR) 20 MG tablet Take 20 mg by mouth every evening.    . trimethoprim (TRIMPEX) 100 MG tablet Take 100 mg by mouth daily.    . bisacodyl (DULCOLAX) 10 MG suppository Place 1 suppository (10 mg total) rectally daily as needed for moderate constipation. (Patient not taking: Reported on 08/30/2014) 10 suppository 0  . ferrous sulfate 325 (65 FE) MG tablet Take 1 tablet (325 mg total) by mouth daily with breakfast. 30 tablet 5  . glimepiride (AMARYL) 4 MG tablet Take 4 mg by mouth every morning.      Marland Kitchen  lisinopril-hydrochlorothiazide (PRINZIDE,ZESTORETIC) 20-25 MG per tablet Take 1 tablet by mouth daily.      Results for orders placed or performed during the hospital encounter of 08/30/14 (from the past 48 hour(s))  Glucose, capillary     Status: Abnormal   Collection Time: 09/03/14 11:23 AM  Result Value Ref Range   Glucose-Capillary 276 (H) 70 - 99 mg/dL  Glucose, capillary     Status: Abnormal   Collection Time: 09/03/14  4:33 PM  Result Value Ref Range   Glucose-Capillary 146 (H) 70 - 99 mg/dL  Glucose, capillary     Status: Abnormal   Collection Time: 09/03/14  8:49 PM  Result Value Ref Range   Glucose-Capillary 129 (H) 70 - 99 mg/dL  Comprehensive metabolic panel     Status: Abnormal   Collection Time: 09/04/14  5:42 AM  Result Value Ref Range   Sodium 136 135 - 145 mmol/L   Potassium 3.9 3.5 - 5.1 mmol/L   Chloride 105 101 - 111 mmol/L   CO2 21 (L) 22 - 32  mmol/L   Glucose, Bld 194 (H) 70 - 99 mg/dL   BUN 34 (H) 6 - 20 mg/dL   Creatinine, Ser 1.33 (H) 0.61 - 1.24 mg/dL   Calcium 8.4 (L) 8.9 - 10.3 mg/dL   Total Protein 5.6 (L) 6.5 - 8.1 g/dL   Albumin 1.9 (L) 3.5 - 5.0 g/dL   AST 14 (L) 15 - 41 U/L   ALT 8 (L) 17 - 63 U/L   Alkaline Phosphatase 64 38 - 126 U/L   Total Bilirubin 0.6 0.3 - 1.2 mg/dL   GFR calc non Af Amer 51 (L) >60 mL/min   GFR calc Af Amer 60 (L) >60 mL/min    Comment: (NOTE) The eGFR has been calculated using the CKD EPI equation. This calculation has not been validated in all clinical situations. eGFR's persistently <90 mL/min signify possible Chronic Kidney Disease.    Anion gap 10 5 - 15  CBC WITH DIFFERENTIAL     Status: Abnormal   Collection Time: 09/04/14  5:42 AM  Result Value Ref Range   WBC 12.2 (H) 4.0 - 10.5 K/uL   RBC 2.59 (L) 4.22 - 5.81 MIL/uL   Hemoglobin 7.7 (L) 13.0 - 17.0 g/dL   HCT 23.3 (L) 39.0 - 52.0 %   MCV 90.0 78.0 - 100.0 fL   MCH 29.7 26.0 - 34.0 pg   MCHC 33.0 30.0 - 36.0 g/dL   RDW 14.4 11.5 - 15.5 %   Platelets 312 150 - 400 K/uL   Neutrophils Relative % 78 (H) 43 - 77 %   Neutro Abs 9.5 (H) 1.7 - 7.7 K/uL   Lymphocytes Relative 11 (L) 12 - 46 %   Lymphs Abs 1.3 0.7 - 4.0 K/uL   Monocytes Relative 6 3 - 12 %   Monocytes Absolute 0.7 0.1 - 1.0 K/uL   Eosinophils Relative 5 0 - 5 %   Eosinophils Absolute 0.6 0.0 - 0.7 K/uL   Basophils Relative 0 0 - 1 %   Basophils Absolute 0.0 0.0 - 0.1 K/uL  Vancomycin, trough     Status: Abnormal   Collection Time: 09/04/14  5:42 AM  Result Value Ref Range   Vancomycin Tr 30 (HH) 10.0 - 20.0 ug/mL    Comment: REPEATED TO VERIFY CRITICAL RESULT CALLED TO, READ BACK BY AND VERIFIED WITH: C.ECKELMANN,RN 09/04/14 0706 BY BSLADE   Glucose, capillary     Status: Abnormal   Collection  Time: 09/04/14  7:40 AM  Result Value Ref Range   Glucose-Capillary 233 (H) 70 - 99 mg/dL  Glucose, capillary     Status: Abnormal   Collection Time: 09/04/14  11:46 AM  Result Value Ref Range   Glucose-Capillary 226 (H) 70 - 99 mg/dL  Glucose, capillary     Status: Abnormal   Collection Time: 09/04/14  4:30 PM  Result Value Ref Range   Glucose-Capillary 169 (H) 70 - 99 mg/dL  Glucose, capillary     Status: Abnormal   Collection Time: 09/04/14  9:37 PM  Result Value Ref Range   Glucose-Capillary 138 (H) 70 - 99 mg/dL  CBC WITH DIFFERENTIAL     Status: Abnormal   Collection Time: 09/05/14  5:40 AM  Result Value Ref Range   WBC 11.1 (H) 4.0 - 10.5 K/uL   RBC 2.59 (L) 4.22 - 5.81 MIL/uL   Hemoglobin 7.7 (L) 13.0 - 17.0 g/dL   HCT 23.5 (L) 39.0 - 52.0 %   MCV 90.7 78.0 - 100.0 fL   MCH 29.7 26.0 - 34.0 pg   MCHC 32.8 30.0 - 36.0 g/dL   RDW 14.5 11.5 - 15.5 %   Platelets 312 150 - 400 K/uL   Neutrophils Relative % 76 43 - 77 %   Neutro Abs 8.4 (H) 1.7 - 7.7 K/uL   Lymphocytes Relative 13 12 - 46 %   Lymphs Abs 1.5 0.7 - 4.0 K/uL   Monocytes Relative 6 3 - 12 %   Monocytes Absolute 0.7 0.1 - 1.0 K/uL   Eosinophils Relative 5 0 - 5 %   Eosinophils Absolute 0.6 0.0 - 0.7 K/uL   Basophils Relative 0 0 - 1 %   Basophils Absolute 0.0 0.0 - 0.1 K/uL  Basic metabolic panel     Status: Abnormal   Collection Time: 09/05/14  5:40 AM  Result Value Ref Range   Sodium 134 (L) 135 - 145 mmol/L   Potassium 3.8 3.5 - 5.1 mmol/L   Chloride 102 101 - 111 mmol/L   CO2 21 (L) 22 - 32 mmol/L   Glucose, Bld 226 (H) 70 - 99 mg/dL   BUN 35 (H) 6 - 20 mg/dL   Creatinine, Ser 1.31 (H) 0.61 - 1.24 mg/dL   Calcium 8.5 (L) 8.9 - 10.3 mg/dL   GFR calc non Af Amer 52 (L) >60 mL/min   GFR calc Af Amer >60 >60 mL/min    Comment: (NOTE) The eGFR has been calculated using the CKD EPI equation. This calculation has not been validated in all clinical situations. eGFR's persistently <90 mL/min signify possible Chronic Kidney Disease.    Anion gap 11 5 - 15   No results found.  Review of Systems  Constitutional: Positive for fever and chills.  HENT: Negative.    Eyes: Negative.   Respiratory: Negative.   Cardiovascular: Negative.   Gastrointestinal: Negative.   Genitourinary: Negative.   Musculoskeletal: Negative.   Skin: Negative.   Neurological: Negative.   Psychiatric/Behavioral: Negative.     Blood pressure 159/70, pulse 89, temperature 98.5 F (36.9 C), temperature source Oral, resp. rate 18, height _0  (1.93 m), weight 95.6 kg (210 lb 12.2 oz), SpO2 96 %. Physical Exam  Constitutional: He is oriented to person, place, and time. He appears well-developed.  HENT:  Head: Normocephalic and atraumatic.  Eyes: Conjunctivae are normal. Pupils are equal, round, and reactive to light.  Cardiovascular: Normal rate.   Respiratory: Effort normal.  Musculoskeletal:  Legs: Neurological: He is alert and oriented to person, place, and time.  Psychiatric: He has a normal mood and affect. His behavior is normal. Judgment and thought content normal.     Assessment/Plan Plan for I and D with Acell and VAC placement.  Risks and complications were reviewed.  [place] 09/05/2014, 10:35 AM

## 2014-09-05 NOTE — Anesthesia Postprocedure Evaluation (Signed)
  Anesthesia Post-op Note  Patient: Lance [name]  Procedure(s) Performed: Procedure(s): IRRIGATION AND DEBRIDEMENT OF LEFT ISCHIUM WOUND WITH  (Left) PLACEMENT OF ACELL AND VAC (Left)  Patient Location: PACU  Anesthesia Type:General  Level of Consciousness: awake, alert , oriented and patient cooperative  Airway and Oxygen Therapy: Patient Spontanous Breathing  Post-op Pain: none  Post-op Assessment: Post-op Vital signs reviewed, Patient's Cardiovascular Status Stable, Respiratory Function Stable, Patent Airway, No signs of Nausea or vomiting and Pain level controlled  Post-op Vital Signs: Reviewed and stable  Last Vitals:  Filed Vitals:   09/05/14 1410  BP:   Pulse:   Temp: 36.8 C  Resp:     Complications: No apparent anesthesia complications

## 2014-09-05 NOTE — Anesthesia Preprocedure Evaluation (Addendum)
Anesthesia Evaluation  Patient identified by MRN, date of birth, ID band Patient awake    Reviewed: Allergy & Precautions, NPO status , Patient's Chart, lab work & pertinent test results  History of Anesthesia Complications (+) PONV and history of anesthetic complications  Airway Mallampati: III  TM Distance: >3 FB Neck ROM: Full  Mouth opening: Limited Mouth Opening  Dental  (+) Teeth Intact, Dental Advisory Given   Pulmonary former smoker,  breath sounds clear to auscultation        Cardiovascular hypertension, Pt. on medications - anginaRhythm:Regular Rate:Normal  '16 ECHO: EF 50-55%, diffusely hypokinetic, mild-mod MR   Neuro/Psych  Neuromuscular disease (severe MS, wheelchair)    GI/Hepatic negative GI ROS, Neg liver ROS,   Endo/Other  diabetes (glu 237), Oral Hypoglycemic Agents  Renal/GU Renal disease (creat 1.31)     Musculoskeletal   Abdominal (+) - obese,   Peds  Hematology  (+) Blood dyscrasia (Hb 7.7), ,   Anesthesia Other Findings   Reproductive/Obstetrics                           Anesthesia Physical Anesthesia Plan  ASA: III  Anesthesia Plan: General   Post-op Pain Management:    Induction: Intravenous  Airway Management Planned: Oral ETT and Video Laryngoscope Planned  Additional Equipment:   Intra-op Plan:   Post-operative Plan: Extubation in OR  Informed Consent: I have reviewed the patients History and Physical, chart, labs and discussed the procedure including the risks, benefits and alternatives for the proposed anesthesia with the patient or authorized representative who has indicated his/her understanding and acceptance.   Dental advisory given  Plan Discussed with: Surgeon and CRNA  Anesthesia Plan Comments: (Plan routine monitors, GETA with VideoGlide intubation)        Anesthesia Quick Evaluation

## 2014-09-06 LAB — BASIC METABOLIC PANEL
Anion gap: 7 (ref 5–15)
BUN: 29 mg/dL — ABNORMAL HIGH (ref 6–20)
CALCIUM: 8.6 mg/dL — AB (ref 8.9–10.3)
CO2: 24 mmol/L (ref 22–32)
CREATININE: 1.24 mg/dL (ref 0.61–1.24)
Chloride: 104 mmol/L (ref 101–111)
GFR, EST NON AFRICAN AMERICAN: 56 mL/min — AB (ref >60)
GLUCOSE: 159 mg/dL — AB (ref 70–99)
Potassium: 4.1 mmol/L (ref 3.5–5.1)
Sodium: 135 mmol/L (ref 135–145)

## 2014-09-06 LAB — CBC
HCT: 22.8 % — ABNORMAL LOW (ref 39.0–52.0)
HEMOGLOBIN: 7.5 g/dL — AB (ref 13.0–17.0)
MCH: 29.9 pg (ref 26.0–34.0)
MCHC: 32.9 g/dL (ref 30.0–36.0)
MCV: 90.8 fL (ref 78.0–100.0)
Platelets: 318 10*3/uL (ref 150–400)
RBC: 2.51 MIL/uL — ABNORMAL LOW (ref 4.22–5.81)
RDW: 14.7 % (ref 11.5–15.5)
WBC: 10.3 10*3/uL (ref 4.0–10.5)

## 2014-09-06 LAB — GLUCOSE, CAPILLARY
GLUCOSE-CAPILLARY: 156 mg/dL — AB (ref 70–99)
GLUCOSE-CAPILLARY: 146 mg/dL — AB (ref 70–99)
Glucose-Capillary: 99 mg/dL (ref 70–99)
Glucose-Capillary: 200 mg/dL — ABNORMAL HIGH (ref 70–99)

## 2014-09-06 MED ORDER — ENOXAPARIN SODIUM 30 MG/0.3ML ~~LOC~~ SOLN
30.0000 mg | SUBCUTANEOUS | Status: DC
  Administered 2014-09-06: 30 mg via SUBCUTANEOUS
  Filled 2014-09-06: qty 0.3

## 2014-09-06 MED ORDER — INSULIN STARTER KIT- PEN NEEDLES (ENGLISH)
1.0000 | Freq: Once | Status: AC
  Administered 2014-09-06: 1
  Filled 2014-09-06: qty 1

## 2014-09-06 MED ORDER — ENOXAPARIN SODIUM 40 MG/0.4ML ~~LOC~~ SOLN
40.0000 mg | SUBCUTANEOUS | Status: DC
  Administered 2014-09-07: 40 mg via SUBCUTANEOUS
  Filled 2014-09-06: qty 0.4

## 2014-09-06 MED ORDER — MUSCLE RUB 10-15 % EX CREA
TOPICAL_CREAM | CUTANEOUS | Status: DC | PRN
  Administered 2014-09-06: 14:00:00 via TOPICAL
  Filled 2014-09-06: qty 85

## 2014-09-06 NOTE — Plan of Care (Signed)
Problem: Phase I Progression Outcomes Goal: OOB as tolerated unless otherwise ordered Outcome: Not Applicable Date Met:  34/14/43 Patient bedbound at baseline. Goal: Voiding-avoid urinary catheter unless indicated Outcome: Not Progressing Patient has a foley catheter.

## 2014-09-06 NOTE — Progress Notes (Signed)
Rounding Note:   Educated patient on insulin pen use at home. Reviewed contents of insulin flexpen starter kit. Reviewed all steps if insulin pen including attachment of needle, 2-unit air shot, dialing up dose, giving injection, removing needle, disposal of sharps, storage of unused insulin, disposal of insulin etc. Patient able to provide successful return demonstration. Also reviewed troubleshooting with insulin pen. Also discussed glucose monitoring at home. Patient states that he has been diagnosed with DM years ago and has always had it controlled through diet and exercise. Lately he has not been physically active as he was in the past and has this current infection. Patient states his glucose levels had never been in the 400-500 range as they were when he came in. He has always had his glucose controlled and had even stopped monitoring it at home due to his levels always being at goal. He currently does not have a working meter or supplies.   Discharge Regimen: Levemir 22 units Daily (order Levemir FlexPen),  Novolog 8 units TID with meals if he eats more than 50% of meals (Novolog FlexPen).  MD: At discharge patient will need glucose meter starter kit (order # [id number]), insulin pen needles (order # [id number]), and the insulin pens themselves.  Thanks,  Tama Headings RN, MSN, [facility] Inpatient Diabetes Coordinator Team Pager [phone]

## 2014-09-06 NOTE — Progress Notes (Signed)
[facility] PROGRESS NOTE  Lance LESCARBEAU [id number] [name]: 1941-03-22 DOA: 08/30/2014 PCP: [name], MD   Off Service Summary: 74yo with hx of MS, DM and sacral decub ulcer followed by Dr. [name] presents with sepsis from MRSA bacteremia from worsening sacral decub ulcer and osteomyelitis. ID was consulted with recommendations for 6 weeks of vanc. The case was discussed with Dr. [name] who is planning for surgical debridement on 5/4. PICC has been placed.  Assessment/Plan: 1. Sepsis with MRSA bacteremia -source Stage 4 sacral decub with osteomyelitis -repeat Blood Cx NGTD, TTE negative for endocarditis' -Followed by ID  -needs 6weeks of IV Vanc, Day 8/42 -also had MRI neck since he complained of neck pain which was unremarkable -s/p I&d and Wound vac 5/4 per dr.Sanger, needs HHRN and FU with Dr.Sanger  2. Stage 4 decub ulcer and osteomyelitis of ischium -s/p debridement and vac placement per dr.Sanger 5/4 -continue IV Vanc as above  3. DM2 -hbiac 9.5, CBGs in 400s on admission -now on levemir and SSI with meal coverage -increased dose -Insulin teaching, will need to go home on Insulin, DM coordinator following  4. Acute on CKD3 -improved, cut down IVF  5. Recent MRSA UTI -likely from bacteremia -now on IV Vanc as above  6. Chronic Anemia -due to CKD, Sepsis and mild iron deficiency noted on anemia panel -Hb 7-8 range and stable, will need outpatient further workup for anemia  7. Chronic MS -with disability  -bed bound at baseline but transfers using his UE, used to be followed at [facility]  8. DVT prophylaxis -SCDs  Code Status: Full Family Communication: wife and son Disposition Plan: Pending, Home Friday of stable   Consultants:  Neurology  ID  Procedures: PROCEDURE: Preparation of left ischial ulcer 4 x 4 x 4.2 cm with debridement of skin, subcutaneous tissue and bone with placement of Acell (7 x 10 cm sheet and 500 mg powder) and the  VAC placement by Dr.Sanger 5/4  Antibiotics:  Ampicillin 4/29>>>4/30  Vanc 4/29>>> (36 remaining days)  Cefepime 4/29>>> 4/30  Cefazolin 4/30>>>5/2  HPI/Subjective: Doing well except for some neck stiffness  Objective: Filed Vitals:   09/05/14 1752 09/05/14 2112 09/06/14 0623 09/06/14 0914  BP: 132/66 125/54 153/66 135/55  Pulse: 80 91 95 93  Temp: 98 F (36.7 C) 98.4 F (36.9 C) 98.1 F (36.7 C) 98.1 F (36.7 C)  TempSrc: Oral Oral Oral Oral  Resp: 16 17 17 16   Height:      Weight:  93.895 kg (207 lb)    SpO2: 94% 94% 94% 95%    Intake/Output Summary (Last 24 hours) at 09/06/14 1337 Last data filed at 09/06/14 0914  Gross per 24 hour  Intake 1018.33 ml  Output   1775 ml  Net -756.67 ml   Filed Weights   09/01/14 2208 09/02/14 2208 09/05/14 2112  Weight: 92.126 kg (203 lb 1.6 oz) 95.6 kg (210 lb 12.2 oz) 93.895 kg (207 lb)    Exam:   General:  Awake, alert, laying in bed, no distress  Cardiovascular: regular, S1, S2/RRR  Respiratory: normal resp effort, no wheezing,  Abdomen: soft,nondistended, obese, nondistended, pos BS  Musculoskeletal: perfused, no clubbing , no cyanosis   Data Reviewed: Basic Metabolic Panel:  Recent Labs Lab 09/02/14 0815 09/03/14 0630 09/04/14 0542 09/05/14 0540 09/06/14 0440  NA 136 136 136 134* 135  K 4.0 4.0 3.9 3.8 4.1  CL 107 108 105 102 104  CO2 17* 17* 21* 21* 24  GLUCOSE  264* 259* 194* 226* 159*  BUN 36* 33* 34* 35* 29*  CREATININE 1.48* 1.47* 1.33* 1.31* 1.24  CALCIUM 8.1* 8.1* 8.4* 8.5* 8.6*   Liver Function Tests:  Recent Labs Lab 08/31/14 0509 09/01/14 0033 09/02/14 0815 09/03/14 0630 09/04/14 0542  AST 23 13 10* 12* 14*  ALT 25 20 14* 8* 8*  ALKPHOS 64 65 68 63 64  BILITOT 0.6 0.5 0.7 0.8 0.6  PROT 6.2 5.9* 5.4* 5.5* 5.6*  ALBUMIN 2.5* 2.2* 2.0* 1.9* 1.9*   No results for input(s): LIPASE, AMYLASE in the last 168 hours. No results for input(s): AMMONIA in the last 168  hours. CBC:  Recent Labs Lab 09/01/14 0033  09/02/14 0815 09/03/14 0630 09/04/14 0542 09/05/14 0540 09/06/14 0440  WBC  --   < > 17.1* 14.8* 12.2* 11.1* 10.3  NEUTROABS 19.7*  --  14.9* 12.4* 9.5* 8.4*  --   HGB 7.8*  < > 7.5* 7.4* 7.7* 7.7* 7.5*  HCT 23.6*  < > 22.5* 22.2* 23.3* 23.5* 22.8*  MCV  --   < > 90.7 90.6 90.0 90.7 90.8  PLT  --   < > 281 288 312 312 318  < > = values in this interval not displayed. Cardiac Enzymes:  Recent Labs Lab 08/30/14 2222  TROPONINI 0.03   BNP (last 3 results) No results for input(s): BNP in the last 8760 hours.  ProBNP (last 3 results) No results for input(s): PROBNP in the last 8760 hours.  CBG:  Recent Labs Lab 09/05/14 1310 09/05/14 1620 09/05/14 2033 09/06/14 0738 09/06/14 1125  GLUCAP 233* 209* 150* 156* 146*    Recent Results (from the past 240 hour(s))  Urine culture     Status: None   Collection Time: 08/30/14 10:03 PM  Result Value Ref Range Status   Specimen Description URINE, CLEAN CATCH  Final   Special Requests NONE  Final   Colony Count   Final    >=100,000 COLONIES/ML Performed at [facility]    Culture   Final    Multiple bacterial morphotypes present, none predominant. Suggest appropriate recollection if clinically indicated. Performed at [facility]    Report Status 09/01/2014 FINAL  Final  Blood culture (routine x 2)     Status: None   Collection Time: 08/30/14 11:18 PM  Result Value Ref Range Status   Specimen Description BLOOD LEFT WRIST  Final   Special Requests BOTTLES DRAWN AEROBIC AND ANAEROBIC 5CC  Final   Culture   Final    METHICILLIN RESISTANT STAPHYLOCOCCUS AUREUS Note: RIFAMPIN AND GENTAMICIN SHOULD NOT BE USED AS SINGLE DRUGS FOR TREATMENT OF STAPH INFECTIONS. This organism DOES NOT demonstrate inducible Clindamycin resistance in vitro. CRITICAL RESULT CALLED TO, READ BACK BY AND VERIFIED WITH: WENDY G BY INGRAM  A 09/02/14 730AM Note: Gram Stain Report Called  to,Read Back By and Verified With: DUSTIN EVERETTE BY INGRAM A 08/31/14 135PM Performed at [facility]    Report Status 09/02/2014 FINAL  Final   Organism ID, Bacteria METHICILLIN RESISTANT STAPHYLOCOCCUS AUREUS  Final      Susceptibility   Methicillin resistant staphylococcus aureus - MIC*    CLINDAMYCIN <=0.25 SENSITIVE Sensitive     ERYTHROMYCIN >=8 RESISTANT Resistant     GENTAMICIN <=0.5 SENSITIVE Sensitive     LEVOFLOXACIN >=8 RESISTANT Resistant     OXACILLIN >=4 RESISTANT Resistant     PENICILLIN >=0.5 RESISTANT Resistant     RIFAMPIN <=0.5 SENSITIVE Sensitive     TRIMETH/SULFA >=  320 RESISTANT Resistant     VANCOMYCIN 1 SENSITIVE Sensitive     TETRACYCLINE <=1 SENSITIVE Sensitive     * METHICILLIN RESISTANT STAPHYLOCOCCUS AUREUS  Blood culture (routine x 2)     Status: None   Collection Time: 08/30/14 11:30 PM  Result Value Ref Range Status   Specimen Description BLOOD RIGHT HAND  Final   Special Requests BOTTLES DRAWN AEROBIC AND ANAEROBIC 4CC  Final   Culture   Final    STAPHYLOCOCCUS AUREUS Note: SUSCEPTIBILITIES PERFORMED ON PREVIOUS CULTURE WITHIN THE LAST 5 DAYS. Note: Gram Stain Report Called to,Read Back By and Verified With: DUSTIN EVERETTE 4.29.16 135PM BY AI Performed at [facility]    Report Status 09/02/2014 FINAL  Final  Respiratory virus panel     Status: None   Collection Time: 08/31/14 10:06 AM  Result Value Ref Range Status   Respiratory Syncytial Virus A Negative Negative Final   Respiratory Syncytial Virus B Negative Negative Final   Influenza A Negative Negative Final   Influenza B Negative Negative Final   Parainfluenza 1 Negative Negative Final   Parainfluenza 2 Negative Negative Final   Parainfluenza 3 Negative Negative Final   Metapneumovirus Negative Negative Final   Rhinovirus Negative Negative Final   Adenovirus Negative Negative Final    Comment: (NOTE) Performed At: [facility] [place], [place]  [id number] Lindon Romp MD [id number]   MRSA PCR Screening     Status: None   Collection Time: 08/31/14  3:28 PM  Result Value Ref Range Status   MRSA by PCR NEGATIVE NEGATIVE Final    Comment:        The GeneXpert MRSA Assay (FDA approved for NASAL specimens only), is one component of a comprehensive MRSA colonization surveillance program. It is not intended to diagnose MRSA infection nor to guide or monitor treatment for MRSA infections.   Culture, blood (routine x 2)     Status: None (Preliminary result)   Collection Time: 09/01/14  7:08 PM  Result Value Ref Range Status   Specimen Description BLOOD LEFT ANTECUBITAL  Final   Special Requests   Final    BOTTLES DRAWN AEROBIC AND ANAEROBIC 10CC AER 5CC ANA   Culture   Final           BLOOD CULTURE RECEIVED NO GROWTH TO DATE CULTURE WILL BE HELD FOR 5 DAYS BEFORE ISSUING A FINAL NEGATIVE REPORT Performed at [facility]    Report Status PENDING  Incomplete  Culture, blood (routine x 2)     Status: None (Preliminary result)   Collection Time: 09/01/14  7:19 PM  Result Value Ref Range Status   Specimen Description BLOOD LEFT HAND  Final   Special Requests   Final    BOTTLES DRAWN AEROBIC AND ANAEROBIC 2CC ANA 3CC AER   Culture   Final           BLOOD CULTURE RECEIVED NO GROWTH TO DATE CULTURE WILL BE HELD FOR 5 DAYS BEFORE ISSUING A FINAL NEGATIVE REPORT Performed at [facility]    Report Status PENDING  Incomplete  Anaerobic culture     Status: None (Preliminary result)   Collection Time: 09/05/14 11:49 AM  Result Value Ref Range Status   Specimen Description BONE  Final   Special Requests LEFT ISCHIUM BONE  Final   Gram Stain   Final    NO WBC SEEN NO ORGANISMS SEEN Performed at [employer] [facility]  Final    NO ANAEROBES ISOLATED; CULTURE IN PROGRESS FOR 5 DAYS Performed at [facility]    Report Status PENDING  Incomplete  Tissue culture     Status: None (Preliminary  result)   Collection Time: 09/05/14 11:49 AM  Result Value Ref Range Status   Specimen Description BONE  Final   Special Requests LEFT ISCHIAL BONE  Final   Gram Stain   Final    NO WBC SEEN NO ORGANISMS SEEN Performed at [facility]    Culture PENDING  Incomplete   Report Status PENDING  Incomplete     Studies: No results found.  Scheduled Meds: . aspirin EC  81 mg Oral Daily  . insulin aspart  0-15 Units Subcutaneous TID WC  . insulin aspart  0-5 Units Subcutaneous QHS  . insulin aspart  8 Units Subcutaneous TID WC  . insulin detemir  22 Units Subcutaneous Daily  . lisinopril  10 mg Oral Daily  . oxybutynin  5 mg Oral BID  . saccharomyces boulardii  250 mg Oral BID  . simvastatin  20 mg Oral QPM  . vancomycin  1,000 mg Intravenous Q24H   Continuous Infusions: . sodium chloride 50 mL/hr at 09/06/14 1206    Active Problems:   Sepsis   Hyperglycemia   AKI (acute kidney injury)   Blood poisoning   Absolute anemia   Osteomyelitis of pelvis   Urinary tract infectious disease   Bacteremia   Left perineal ischial pressure ulcer   Javarious Elsayed  [facility] Pager [phone]. If 7PM-7AM, please contact night-coverage at [email or web address], password [facility] 09/06/2014, 1:37 PM  LOS: 6 days

## 2014-09-07 DIAGNOSIS — A4102 Sepsis due to Methicillin resistant Staphylococcus aureus: Secondary | ICD-10-CM | POA: Insufficient documentation

## 2014-09-07 LAB — BASIC METABOLIC PANEL
Anion gap: 9 (ref 5–15)
BUN: 27 mg/dL — AB (ref 6–20)
CO2: 22 mmol/L (ref 22–32)
CREATININE: 1.23 mg/dL (ref 0.61–1.24)
Calcium: 8.4 mg/dL — ABNORMAL LOW (ref 8.9–10.3)
Chloride: 104 mmol/L (ref 101–111)
GFR calc Af Amer: >60 mL/min (ref >60)
GFR, EST NON AFRICAN AMERICAN: 56 mL/min — AB (ref >60)
Glucose, Bld: 191 mg/dL — ABNORMAL HIGH (ref 70–99)
Potassium: 3.8 mmol/L (ref 3.5–5.1)
Sodium: 135 mmol/L (ref 135–145)

## 2014-09-07 LAB — CBC
HCT: 22 % — ABNORMAL LOW (ref 39.0–52.0)
HEMOGLOBIN: 7.3 g/dL — AB (ref 13.0–17.0)
MCH: 30.5 pg (ref 26.0–34.0)
MCHC: 33.2 g/dL (ref 30.0–36.0)
MCV: 92.1 fL (ref 78.0–100.0)
Platelets: 288 10*3/uL (ref 150–400)
RBC: 2.39 MIL/uL — ABNORMAL LOW (ref 4.22–5.81)
RDW: 14.7 % (ref 11.5–15.5)
WBC: 8 10*3/uL (ref 4.0–10.5)

## 2014-09-07 LAB — GLUCOSE, CAPILLARY
GLUCOSE-CAPILLARY: 200 mg/dL — AB (ref 70–99)
GLUCOSE-CAPILLARY: 219 mg/dL — AB (ref 70–99)
GLUCOSE-CAPILLARY: 63 mg/dL — AB (ref 70–99)

## 2014-09-07 MED ORDER — HEPARIN SOD (PORK) LOCK FLUSH 100 UNIT/ML IV SOLN
250.0000 [IU] | INTRAVENOUS | Status: AC | PRN
  Administered 2014-09-07: 250 [IU]

## 2014-09-07 MED ORDER — VANCOMYCIN HCL IN DEXTROSE 1-5 GM/200ML-% IV SOLN: 1000.0000 mg | INTRAVENOUS | Status: DC

## 2014-09-07 MED ORDER — INSULIN DETEMIR 100 UNIT/ML FLEXPEN: 22.0000 [IU] | PEN_INJECTOR | Freq: Every morning | SUBCUTANEOUS | Status: DC

## 2014-09-07 MED ORDER — INSULIN ASPART 100 UNIT/ML FLEXPEN: 8.0000 [IU] | PEN_INJECTOR | Freq: Three times a day (TID) | SUBCUTANEOUS | Status: DC

## 2014-09-07 NOTE — Progress Notes (Signed)
ANTIBIOTIC CONSULT NOTE - FOLLOW UP  Pharmacy Consult for Vancomycin Indication: MRSA Bacteremia  No Known Allergies  Patient Measurements: Height: 6\' 4"  (193 cm) Weight: 207 lb 10.8 oz (94.2 kg) IBW/kg (Calculated) : 86.8  Vital Signs: Temp: 98.6 F (37 C) (05/06 0804) Temp Source: Oral (05/06 0804) BP: 157/62 mmHg (05/06 0804) Pulse Rate: 94 (05/06 0804) Intake/Output from previous day: 05/05 0701 - 05/06 0700 In: 2152.5 [P.O.:960; I.V.:1192.5] Out: 2500 [Urine:2500] Intake/Output from this shift: Total I/O In: 240 [P.O.:240] Out: 650 [Stool:650]  Labs:  Recent Labs  09/05/14 0540 09/06/14 0440 09/07/14 0550  WBC 11.1* 10.3 8.0  HGB 7.7* 7.5* 7.3*  PLT 312 318 288  CREATININE 1.31* 1.24 1.23   Estimated Creatinine Clearance: 65.7 mL/min (by C-G formula based on Cr of 1.23). No results for input(s): VANCOTROUGH, VANCOPEAK, VANCORANDOM, GENTTROUGH, GENTPEAK, GENTRANDOM, TOBRATROUGH, TOBRAPEAK, TOBRARND, AMIKACINPEAK, AMIKACINTROU, AMIKACIN in the last 72 hours.   Microbiology: Recent Results (from the past 720 hour(s))  Urine culture     Status: None   Collection Time: 08/30/14 10:03 PM  Result Value Ref Range Status   Specimen Description URINE, CLEAN CATCH  Final   Special Requests NONE  Final   Colony Count   Final    >=100,000 COLONIES/ML Performed at [facility]    Culture   Final    Multiple bacterial morphotypes present, none predominant. Suggest appropriate recollection if clinically indicated. Performed at [facility]    Report Status 09/01/2014 FINAL  Final  Blood culture (routine x 2)     Status: None   Collection Time: 08/30/14 11:18 PM  Result Value Ref Range Status   Specimen Description BLOOD LEFT WRIST  Final   Special Requests BOTTLES DRAWN AEROBIC AND ANAEROBIC 5CC  Final   Culture   Final    METHICILLIN RESISTANT STAPHYLOCOCCUS AUREUS Note: RIFAMPIN AND GENTAMICIN SHOULD NOT BE USED AS SINGLE DRUGS FOR TREATMENT OF  STAPH INFECTIONS. This organism DOES NOT demonstrate inducible Clindamycin resistance in vitro. CRITICAL RESULT CALLED TO, READ BACK BY AND VERIFIED WITH: WENDY G BY INGRAM  A 09/02/14 730AM Note: Gram Stain Report Called to,Read Back By and Verified With: DUSTIN EVERETTE BY INGRAM A 08/31/14 135PM Performed at [facility]    Report Status 09/02/2014 FINAL  Final   Organism ID, Bacteria METHICILLIN RESISTANT STAPHYLOCOCCUS AUREUS  Final      Susceptibility   Methicillin resistant staphylococcus aureus - MIC*    CLINDAMYCIN <=0.25 SENSITIVE Sensitive     ERYTHROMYCIN >=8 RESISTANT Resistant     GENTAMICIN <=0.5 SENSITIVE Sensitive     LEVOFLOXACIN >=8 RESISTANT Resistant     OXACILLIN >=4 RESISTANT Resistant     PENICILLIN >=0.5 RESISTANT Resistant     RIFAMPIN <=0.5 SENSITIVE Sensitive     TRIMETH/SULFA >=320 RESISTANT Resistant     VANCOMYCIN 1 SENSITIVE Sensitive     TETRACYCLINE <=1 SENSITIVE Sensitive     * METHICILLIN RESISTANT STAPHYLOCOCCUS AUREUS  Blood culture (routine x 2)     Status: None   Collection Time: 08/30/14 11:30 PM  Result Value Ref Range Status   Specimen Description BLOOD RIGHT HAND  Final   Special Requests BOTTLES DRAWN AEROBIC AND ANAEROBIC 4CC  Final   Culture   Final    STAPHYLOCOCCUS AUREUS Note: SUSCEPTIBILITIES PERFORMED ON PREVIOUS CULTURE WITHIN THE LAST 5 DAYS. Note: Gram Stain Report Called to,Read Back By and Verified With: DUSTIN EVERETTE 4.29.16 135PM BY AI Performed at [facility]  Report Status 09/02/2014 FINAL  Final  Respiratory virus panel     Status: None   Collection Time: 08/31/14 10:06 AM  Result Value Ref Range Status   Respiratory Syncytial Virus A Negative Negative Final   Respiratory Syncytial Virus B Negative Negative Final   Influenza A Negative Negative Final   Influenza B Negative Negative Final   Parainfluenza 1 Negative Negative Final   Parainfluenza 2 Negative Negative Final   Parainfluenza 3 Negative  Negative Final   Metapneumovirus Negative Negative Final   Rhinovirus Negative Negative Final   Adenovirus Negative Negative Final    Comment: (NOTE) Performed At: [facility] [place], [place] [id number] Lindon Romp MD [id number]   MRSA PCR Screening     Status: None   Collection Time: 08/31/14  3:28 PM  Result Value Ref Range Status   MRSA by PCR NEGATIVE NEGATIVE Final    Comment:        The GeneXpert MRSA Assay (FDA approved for NASAL specimens only), is one component of a comprehensive MRSA colonization surveillance program. It is not intended to diagnose MRSA infection nor to guide or monitor treatment for MRSA infections.   Culture, blood (routine x 2)     Status: None (Preliminary result)   Collection Time: 09/01/14  7:08 PM  Result Value Ref Range Status   Specimen Description BLOOD LEFT ANTECUBITAL  Final   Special Requests   Final    BOTTLES DRAWN AEROBIC AND ANAEROBIC 10CC AER 5CC ANA   Culture   Final           BLOOD CULTURE RECEIVED NO GROWTH TO DATE CULTURE WILL BE HELD FOR 5 DAYS BEFORE ISSUING A FINAL NEGATIVE REPORT Performed at [facility]    Report Status PENDING  Incomplete  Culture, blood (routine x 2)     Status: None (Preliminary result)   Collection Time: 09/01/14  7:19 PM  Result Value Ref Range Status   Specimen Description BLOOD LEFT HAND  Final   Special Requests   Final    BOTTLES DRAWN AEROBIC AND ANAEROBIC 2CC ANA 3CC AER   Culture   Final           BLOOD CULTURE RECEIVED NO GROWTH TO DATE CULTURE WILL BE HELD FOR 5 DAYS BEFORE ISSUING A FINAL NEGATIVE REPORT Performed at [facility]    Report Status PENDING  Incomplete  Anaerobic culture     Status: None (Preliminary result)   Collection Time: 09/05/14 11:49 AM  Result Value Ref Range Status   Specimen Description BONE  Final   Special Requests LEFT ISCHIUM BONE  Final   Gram Stain   Final    NO WBC SEEN NO ORGANISMS SEEN Performed  at [facility]    Culture   Final    NO ANAEROBES ISOLATED; CULTURE IN PROGRESS FOR 5 DAYS Performed at [facility]    Report Status PENDING  Incomplete  Tissue culture     Status: None (Preliminary result)   Collection Time: 09/05/14 11:49 AM  Result Value Ref Range Status   Specimen Description BONE  Final   Special Requests LEFT ISCHIAL BONE  Final   Gram Stain   Final    NO WBC SEEN NO ORGANISMS SEEN Performed at [facility]    Culture   Final    FEW STAPHYLOCOCCUS AUREUS Note: RIFAMPIN AND GENTAMICIN SHOULD NOT BE USED AS SINGLE DRUGS FOR TREATMENT OF STAPH INFECTIONS. Performed at  [facility]    Report Status PENDING  Incomplete    Anti-infectives    Start     Dose/Rate Route Frequency Ordered Stop   09/08/14 0000  vancomycin (VANCOCIN) 1 GM/200ML SOLN  Status:  Discontinued     1,000 mg 200 mL/hr over 60 Minutes Intravenous Every 24 hours 09/07/14 0953 09/07/14    09/08/14 0000  vancomycin (VANCOCIN) 1 GM/200ML SOLN     1,000 mg 200 mL/hr over 60 Minutes Intravenous Every 24 hours 09/07/14 1040     09/05/14 1136  polymyxin B 500,000 Units, bacitracin 50,000 Units in sodium chloride irrigation 0.9 % 500 mL irrigation  Status:  Discontinued       As needed 09/05/14 1136 09/05/14 1204   09/05/14 1100  ceFAZolin (ANCEF) IVPB 2 g/50 mL premix  Status:  Discontinued     2 g 100 mL/hr over 30 Minutes Intravenous On call to O.R. 09/05/14 0015 09/05/14 1436   09/05/14 0600  ceFAZolin (ANCEF) IVPB 2 g/50 mL premix  Status:  Discontinued     2 g 100 mL/hr over 30 Minutes Intravenous On call to O.R. 09/04/14 1231 09/04/14 1442   09/05/14 0200  vancomycin (VANCOCIN) IVPB 1000 mg/200 mL premix     1,000 mg 200 mL/hr over 60 Minutes Intravenous Every 24 hours 09/04/14 1333     09/01/14 1700  ceFAZolin (ANCEF) IVPB 2 g/50 mL premix  Status:  Discontinued     2 g 100 mL/hr over 30 Minutes Intravenous 3 times per day 09/01/14 1609 09/03/14 1456    09/01/14 1700  vancomycin (VANCOCIN) IVPB 750 mg/150 ml premix  Status:  Discontinued     750 mg 150 mL/hr over 60 Minutes Intravenous Every 12 hours 09/01/14 1612 09/04/14 0708   08/31/14 1600  cefTRIAXone (ROCEPHIN) 2 g in dextrose 5 % 50 mL IVPB  Status:  Discontinued     2 g 100 mL/hr over 30 Minutes Intravenous Every 12 hours 08/31/14 0511 09/01/14 0920   08/31/14 1200  vancomycin (VANCOCIN) IVPB 750 mg/150 ml premix  Status:  Discontinued     750 mg 150 mL/hr over 60 Minutes Intravenous Every 12 hours 08/31/14 0511 09/01/14 1607   08/31/14 0800  ampicillin (OMNIPEN) 2 g in sodium chloride 0.9 % 50 mL IVPB  Status:  Discontinued     2 g 150 mL/hr over 20 Minutes Intravenous 6 times per day 08/31/14 0511 09/01/14 0920   08/31/14 0030  cefTRIAXone (ROCEPHIN) 2 g in dextrose 5 % 50 mL IVPB     2 g 100 mL/hr over 30 Minutes Intravenous  Once 08/31/14 0021 08/31/14 0535   08/31/14 0030  vancomycin (VANCOCIN) IVPB 1000 mg/200 mL premix  Status:  Discontinued     1,000 mg 200 mL/hr over 60 Minutes Intravenous  Once 08/31/14 0021 08/31/14 0029   08/31/14 0030  ampicillin (OMNIPEN) 2 g in sodium chloride 0.9 % 50 mL IVPB     2 g 150 mL/hr over 20 Minutes Intravenous  Once 08/31/14 0021 08/31/14 0452   08/30/14 2315  ceFEPIme (MAXIPIME) 2 g in dextrose 5 % 50 mL IVPB     2 g 100 mL/hr over 30 Minutes Intravenous  Once 08/30/14 2307 08/31/14 0031   08/30/14 2315  vancomycin (VANCOCIN) IVPB 1000 mg/200 mL premix     1,000 mg 200 mL/hr over 60 Minutes Intravenous  Once 08/30/14 2308 08/31/14 0227      Assessment: 17 YOM who continues on Vancomycin for MRSA bacteremia thought  to have potentially seeded from sacral decubitus ulcer - with imaging showing osteo. Renal function has been slowly improving - SCr 1.23, CrCl ~65 ml/min.   Goal of Therapy:  Vancomycin trough level 15-20 mcg/ml  Plan:  1. Continue Vancomycin 1g IV every 24 hours  2. Will check vanc trough tonight (pt at [facility]) 3.  Will continue to follow renal function, culture results, LOT  Sherlon Handing, PharmD, BCPS Clinical pharmacist, pager [phone] 09/07/2014 1:12 PM

## 2014-09-07 NOTE — Discharge Summary (Addendum)
Physician Discharge Summary  Lance [name] [id number] [name]: [birth year] DOA: 08/30/2014  PCP: [name], MD  Admit date: 08/30/2014 Discharge date: 09/07/2014  Time spent: 60 minutes  Recommendations for Outpatient Follow-up:  1. Dr.Sanger in 1 week, 5/16 at [place], will need Wound Vac changed 2. Home health RN [place] [facility] [facility], office will call with appt 4. Vanc trough in 4 doses then weekly and weekly Bmet, results to be faxed to Dr.Hatcher [facility] at [phone]  Discharge Diagnoses:  Active Problems:   Sepsis   MRSA bacteremia   Stage 4 sacral decubitus ulcers   Ischial osteomyelitis   Uncontrolled DM   AKI (acute kidney injury)   Blood poisoning   Absolute anemia   Osteomyelitis of pelvis   Urinary tract infectious disease   Chronic anemia  Discharge Condition: stable  Diet recommendation: diabetic  Filed Weights   09/02/14 2208 09/05/14 2112 09/06/14 2101  Weight: 95.6 kg (210 lb 12.2 oz) 93.895 kg (207 lb) 94.2 kg (207 lb 10.8 oz)    History of present illness:  Chief Complaint: sepsis, anemia, hyperglycemia, AKI History of Present Illness:This is a 74 y.o. year old male with significant past medical history of MS, HTN, type 2 DM, anemia, urinary retention, stage 3 CKD presented with sepsis, anemia, hyperglycemia.   Hospital Course:  1. Sepsis with MRSA bacteremia -source was Stage 4 sacral decub with ischial osteomyelitis -Initial blood Cx positive for MRSA, repeat Blood Cx NGTD, TTE negative for endocarditis -Followed by ID, did consider TEE however would need to get 6 weeks of vanco for his sacral osteo and bacteremia regardless. -needs 6weeks of IV Vanc, Day 9/42 -also had MRI neck since he complained of neck pain which was unremarkable -s/p I&d and Wound vac 5/4 per dr.Sanger, needs HHRN and FU with Dr.Sanger  2. Stage 4 decub ulcer and osteomyelitis of ischium -s/p debridement and vac placement per dr.Sanger 5/4 -continue IV  Vanc as above  3. DM2 -hbiac 9.5, CBGs in 400s on admission -now on levemir and SSI with meal coverage -increased dose to 18units daily  -Insulin teaching completed, followed by DM coordinator   4. Acute on CKD3 -due to sepsis, volume depletion -resolved  5. Recent MRSA UTI -likely from bacteremia -now on IV Vanc as above  6. Chronic Anemia -due to CKD, Sepsis and mild iron deficiency noted on anemia panel -Hb 7-8 range and stable, will need outpatient further workup for anemia  7. Chronic MS -with disability  -bed bound at baseline but transfers using his UE, used to be followed at [facility]   Procedures:  -I&D: Preparation of left ischial ulcer 4 x 4 x 4.2 cm with debridement of skin, subcutaneous tissue and bone with placement of Acell (7 x 10 cm sheet and 500 mg powder) and the VAC placement by Dr.Sanger 5/4  Consultations:  ID  Dr.Sanger, plastics  Discharge Exam: Filed Vitals:   09/07/14 0804  BP: 157/62  Pulse: 94  Temp: 98.6 F (37 C)  Resp: 17    General: AAOx3 Cardiovascular: S1S2/RRR Respiratory: CATB  Discharge Instructions   Discharge Instructions    Diet Carb Modified    Complete by:  As directed      Discharge instructions    Complete by:  As directed   Vac, Drain care   Pharmacy to Manage IV Vanc Dosing, Check Vanc trough in 4doses, please check weekly Bmet, fax results to Dr.Hatcher at [facility] at [phone]     Increase activity  slowly    Complete by:  As directed           Current Discharge Medication List    START taking these medications   Details  insulin aspart (NOVOLOG FLEXPEN) 100 UNIT/ML FlexPen Inject 8 Units into the skin 3 (three) times daily with meals. Qty: 15 mL, Refills: 2    Insulin Detemir (LEVEMIR) 100 UNIT/ML Pen Inject 22 Units into the skin every morning. Qty: 15 mL, Refills: 2    vancomycin (VANCOCIN) 1 GM/200ML SOLN Inject 200 mLs (1,000 mg total) into the vein daily. Qty: 4000 mL, Refills: 1       CONTINUE these medications which have NOT CHANGED   Details  Ascorbic Acid (VITAMIN C PO) Take by mouth.    aspirin 81 MG tablet Take 1 tablet (81 mg total) by mouth daily. Qty: 1 tablet, Refills: 0    Cholecalciferol (VITAMIN D3) 2000 UNITS TABS Take 2,000 mg by mouth daily.      fish oil-omega-3 fatty acids 1000 MG capsule Take 1 capsule (1 g total) by mouth daily. Qty: 1 capsule, Refills: 0    Liniments (SALONPAS PAIN RELIEF PATCH EX) Apply 1 patch topically daily as needed (pain).    Multiple Vitamin (MULTIVITAMIN WITH MINERALS) TABS tablet Take 1 tablet by mouth daily.    oxybutynin (DITROPAN) 5 MG tablet Take 5 mg by mouth 2 (two) times daily.      pioglitazone (ACTOS) 30 MG tablet Take 30 mg by mouth daily.      simvastatin (ZOCOR) 20 MG tablet Take 20 mg by mouth every evening.    trimethoprim (TRIMPEX) 100 MG tablet Take 100 mg by mouth daily.    bisacodyl (DULCOLAX) 10 MG suppository Place 1 suppository (10 mg total) rectally daily as needed for moderate constipation. Qty: 10 suppository, Refills: 0    ferrous sulfate 325 (65 FE) MG tablet Take 1 tablet (325 mg total) by mouth daily with breakfast. Qty: 30 tablet, Refills: 5      STOP taking these medications     glimepiride (AMARYL) 2 MG tablet      lisinopril (PRINIVIL,ZESTRIL) 10 MG tablet      glimepiride (AMARYL) 4 MG tablet      lisinopril-hydrochlorothiazide (PRINZIDE,ZESTORETIC) 20-25 MG per tablet        No Known Allergies Follow-up Information    Follow up with [facility]              In 1 week.   Why:  with Dr.Sanger; APPOINTMENT: MONDAY, Sep 17, 2014 @ 8:00AM   Contact information:   [address] [phone]      Follow up with Bobby Rumpf, MD.   Specialty:  Infectious Diseases   Why:  office will call you with appt   Contact information:   [place]  [address] [phone]       Follow up with [facility].   Why:  Home Health RN and IV antibiotics   Contact information:   [address] [place] [address] [phone]        The results of significant diagnostics from this hospitalization (including imaging, microbiology, ancillary and laboratory) are listed below for reference.    Significant Diagnostic Studies: Mr Cervical Spine W Wo Contrast  09/03/2014   CLINICAL DATA:  Worsening severe neck pain and stiffness. History of multiple sclerosis. Concurrent pelvic osteomyelitis.  EXAM: MRI  CERVICAL SPINE WITHOUT AND WITH CONTRAST  TECHNIQUE: Multiplanar and multiecho pulse sequences of the cervical spine, to include the craniocervical junction and cervicothoracic junction, were obtained according to standard protocol without and with intravenous contrast.  CONTRAST:  17mL MULTIHANCE GADOBENATE DIMEGLUMINE 529 MG/ML IV SOLN  COMPARISON:  None.  FINDINGS: Moderately motion degraded examination.  Cervical vertebral bodies appear intact and aligned, straightened cervical lordosis. Moderate to severe C3-4, C5-6 and C6-7 disc height loss, moderate at C4-5. Decreased T2 signal within all cervical discs consistent with moderate desiccation. Moderate subacute to chronic discogenic endplate changes [id number] through C6-7, superimposed mild acute component C3-4. No STIR signal abnormality to suggest fracture. Small T2 facet hemangioma on the LEFT.  Limited assessment of cervical spinal cord signal abnormality due to motion. Mild myelomalacia suspected within the upper thoracic. No abnormal spinal cord, leptomeningeal or epidural enhancement. Included prevertebral and paraspinal soft tissues are nonsuspicious.  Level by level evaluation:  C2-3: Uncovertebral hypertrophy and mild facet arthropathy without canal stenosis or neural foraminal narrowing.  C3-4: Moderate broad-based disc disc bulge, uncovertebral hypertrophy, mild facet  arthropathy. Mild canal stenosis. Mild RIGHT, moderate to severe LEFT neural foraminal narrowing.  C4-5: Small broad-based disc bulge, uncovertebral hypertrophy and mild facet arthropathy. Mild canal stenosis. Mild RIGHT, severe LEFT neural foraminal narrowing.  C5-6: Small broad-based disc bulge, uncovertebral hypertrophy and mild facet arthropathy result in mild canal stenosis. Moderate to severe bilateral neural foraminal narrowing.  C6-7: Small broad-based disc bulge, uncovertebral hypertrophy and mild facet arthropathy without canal stenosis. Moderate to severe bilateral neural foraminal narrowing.  C7-T1: Annular bulging without canal stenosis or neural foraminal narrowing.  IMPRESSION: Moderately motion degraded examination limits assessment for demyelinating plaques, suspected mild upper thoracic myelomalacia without abnormal cord enhancement to suggest acute inflammation.  Degenerative change of the cervical spine without acute fracture nor malalignment.  Mild canal stenosis C3-4 through C5-6. Neural foraminal narrowing C3-4 thru C6-7: Severe on the LEFT at C4-5, moderate to severe bilaterally at C5-6 and C6-7.   Electronically Signed   By: Elon Alas   On: 09/03/2014 04:19   Mr Pelvis W Wo Contrast  09/02/2014   CLINICAL DATA:  Stage IV sacral decubitus ulcer with active drainage. Evaluate healing and assess for underlying abscess/ osteomyelitis. Subsequent encounter.  EXAM: MRI PELVIS WITHOUT AND WITH CONTRAST  TECHNIQUE: Multiplanar multisequence MR imaging of the pelvis was performed both before and after administration of intravenous contrast.  CONTRAST:  28mL MULTIHANCE GADOBENATE DIMEGLUMINE 529 MG/ML IV SOLN  COMPARISON:  MRI 04/25/2014, radiographs 04/05/2014 and CT 03/31/2011.  FINDINGS: There is increased ulceration posterior to the left ischium with associated soft tissue emphysema. The previously demonstrated inflammatory changes within the subcutaneous fat along the right aspect of the  sacrum and coccyx have nearly resolved. There is no focal fluid collection in this area or residual skin ulceration. Chronic changes within the subcutaneous fat posterior to both femoral greater trochanters are incompletely visualized due to the field-of-view and patient's size, but do not show any obvious progression. There is no evidence of soft tissue abscess.  The previously demonstrated T2 hyper throughout the right gluteus medius muscle has resolved. Generalized muscular atrophy is again noted.  There is new T2 hyperintensity and marrow enhancement within the left ischium adjacent to the enlarging decubitus ulcer worrisome for osteomyelitis. No other cortical destruction, T1 signal alteration or bone marrow edema identified. Small focus of T2 hyperintensity within the left superior pubic ramus, best seen on coronal image number 20 of series  4, is unchanged. The sacroiliac and hip joints appear normal. Mild T2 hyperintensity deep to the iliacus muscles is similar to the prior study.  Foley catheter is in place. There is diffuse bladder wall thickening. Penile prosthesis noted.  IMPRESSION: 1. Enlarging decubitus ulcer over the left ischium with marrow changes in the bone worrisome for osteomyelitis. No evidence of soft tissue abscess. 2. Other areas of chronic soft tissue ulceration along the posterior right aspect of the sacrum and posterior to both femoral trochanters are stable to improved.   Electronically Signed   By: Richardean Sale M.D.   On: 09/02/2014 12:10   Dg Chest Portable 1 View  08/30/2014   CLINICAL DATA:  Bilateral neck pain  EXAM: PORTABLE CHEST - 1 VIEW  COMPARISON:  03/30/2011  FINDINGS: Normal mediastinum and cardiac silhouette. Normal pulmonary vasculature. No evidence of effusion, infiltrate, or pneumothorax. No acute bony abnormality.  IMPRESSION: No acute cardiopulmonary process.   Electronically Signed   By: Suzy Bouchard M.D.   On: 08/30/2014 22:46    Microbiology: Recent  Results (from the past 240 hour(s))  Urine culture     Status: None   Collection Time: 08/30/14 10:03 PM  Result Value Ref Range Status   Specimen Description URINE, CLEAN CATCH  Final   Special Requests NONE  Final   Colony Count   Final    >=100,000 COLONIES/ML Performed at [facility]    Culture   Final    Multiple bacterial morphotypes present, none predominant. Suggest appropriate recollection if clinically indicated. Performed at [facility]    Report Status 09/01/2014 FINAL  Final  Blood culture (routine x 2)     Status: None   Collection Time: 08/30/14 11:18 PM  Result Value Ref Range Status   Specimen Description BLOOD LEFT WRIST  Final   Special Requests BOTTLES DRAWN AEROBIC AND ANAEROBIC 5CC  Final   Culture   Final    METHICILLIN RESISTANT STAPHYLOCOCCUS AUREUS Note: RIFAMPIN AND GENTAMICIN SHOULD NOT BE USED AS SINGLE DRUGS FOR TREATMENT OF STAPH INFECTIONS. This organism DOES NOT demonstrate inducible Clindamycin resistance in vitro. CRITICAL RESULT CALLED TO, READ BACK BY AND VERIFIED WITH: WENDY G BY INGRAM  A 09/02/14 730AM Note: Gram Stain Report Called to,Read Back By and Verified With: DUSTIN EVERETTE BY INGRAM A 08/31/14 135PM Performed at [facility]    Report Status 09/02/2014 FINAL  Final   Organism ID, Bacteria METHICILLIN RESISTANT STAPHYLOCOCCUS AUREUS  Final      Susceptibility   Methicillin resistant staphylococcus aureus - MIC*    CLINDAMYCIN <=0.25 SENSITIVE Sensitive     ERYTHROMYCIN >=8 RESISTANT Resistant     GENTAMICIN <=0.5 SENSITIVE Sensitive     LEVOFLOXACIN >=8 RESISTANT Resistant     OXACILLIN >=4 RESISTANT Resistant     PENICILLIN >=0.5 RESISTANT Resistant     RIFAMPIN <=0.5 SENSITIVE Sensitive     TRIMETH/SULFA >=320 RESISTANT Resistant     VANCOMYCIN 1 SENSITIVE Sensitive     TETRACYCLINE <=1 SENSITIVE Sensitive     * METHICILLIN RESISTANT STAPHYLOCOCCUS AUREUS  Blood culture (routine x 2)     Status: None    Collection Time: 08/30/14 11:30 PM  Result Value Ref Range Status   Specimen Description BLOOD RIGHT HAND  Final   Special Requests BOTTLES DRAWN AEROBIC AND ANAEROBIC 4CC  Final   Culture   Final    STAPHYLOCOCCUS AUREUS Note: SUSCEPTIBILITIES PERFORMED ON PREVIOUS CULTURE WITHIN THE LAST 5 DAYS. Note: Gram Stain  Report Called to,Read Back By and Verified With: DUSTIN EVERETTE 4.29.16 135PM BY AI Performed at [facility]    Report Status 09/02/2014 FINAL  Final  Respiratory virus panel     Status: None   Collection Time: 08/31/14 10:06 AM  Result Value Ref Range Status   Respiratory Syncytial Virus A Negative Negative Final   Respiratory Syncytial Virus B Negative Negative Final   Influenza A Negative Negative Final   Influenza B Negative Negative Final   Parainfluenza 1 Negative Negative Final   Parainfluenza 2 Negative Negative Final   Parainfluenza 3 Negative Negative Final   Metapneumovirus Negative Negative Final   Rhinovirus Negative Negative Final   Adenovirus Negative Negative Final    Comment: (NOTE) Performed At: [facility] [place], [place] [id number] Lindon Romp MD [id number]   MRSA PCR Screening     Status: None   Collection Time: 08/31/14  3:28 PM  Result Value Ref Range Status   MRSA by PCR NEGATIVE NEGATIVE Final    Comment:        The GeneXpert MRSA Assay (FDA approved for NASAL specimens only), is one component of a comprehensive MRSA colonization surveillance program. It is not intended to diagnose MRSA infection nor to guide or monitor treatment for MRSA infections.   Culture, blood (routine x 2)     Status: None (Preliminary result)   Collection Time: 09/01/14  7:08 PM  Result Value Ref Range Status   Specimen Description BLOOD LEFT ANTECUBITAL  Final   Special Requests   Final    BOTTLES DRAWN AEROBIC AND ANAEROBIC 10CC AER 5CC ANA   Culture   Final           BLOOD CULTURE RECEIVED NO GROWTH TO DATE  CULTURE WILL BE HELD FOR 5 DAYS BEFORE ISSUING A FINAL NEGATIVE REPORT Performed at [facility]    Report Status PENDING  Incomplete  Culture, blood (routine x 2)     Status: None (Preliminary result)   Collection Time: 09/01/14  7:19 PM  Result Value Ref Range Status   Specimen Description BLOOD LEFT HAND  Final   Special Requests   Final    BOTTLES DRAWN AEROBIC AND ANAEROBIC 2CC ANA 3CC AER   Culture   Final           BLOOD CULTURE RECEIVED NO GROWTH TO DATE CULTURE WILL BE HELD FOR 5 DAYS BEFORE ISSUING A FINAL NEGATIVE REPORT Performed at [facility]    Report Status PENDING  Incomplete  Anaerobic culture     Status: None (Preliminary result)   Collection Time: 09/05/14 11:49 AM  Result Value Ref Range Status   Specimen Description BONE  Final   Special Requests LEFT ISCHIUM BONE  Final   Gram Stain   Final    NO WBC SEEN NO ORGANISMS SEEN Performed at [facility]    Culture   Final    NO ANAEROBES ISOLATED; CULTURE IN PROGRESS FOR 5 DAYS Performed at [facility]    Report Status PENDING  Incomplete  Tissue culture     Status: None (Preliminary result)   Collection Time: 09/05/14 11:49 AM  Result Value Ref Range Status   Specimen Description BONE  Final   Special Requests LEFT ISCHIAL BONE  Final   Gram Stain   Final    NO WBC SEEN NO ORGANISMS SEEN Performed at [facility]   Final  FEW STAPHYLOCOCCUS AUREUS Note: RIFAMPIN AND GENTAMICIN SHOULD NOT BE USED AS SINGLE DRUGS FOR TREATMENT OF STAPH INFECTIONS. Performed at [facility]    Report Status PENDING  Incomplete     Labs: Basic Metabolic Panel:  Recent Labs Lab 09/03/14 0630 09/04/14 0542 09/05/14 0540 09/06/14 0440 09/07/14 0550  NA 136 136 134* 135 135  K 4.0 3.9 3.8 4.1 3.8  CL 108 105 102 104 104  CO2 17* 21* 21* 24 22  GLUCOSE 259* 194* 226* 159* 191*  BUN 33* 34* 35* 29* 27*  CREATININE 1.47* 1.33* 1.31* 1.24 1.23   CALCIUM 8.1* 8.4* 8.5* 8.6* 8.4*   Liver Function Tests:  Recent Labs Lab 09/01/14 0033 09/02/14 0815 09/03/14 0630 09/04/14 0542  AST 13 10* 12* 14*  ALT 20 14* 8* 8*  ALKPHOS 65 68 63 64  BILITOT 0.5 0.7 0.8 0.6  PROT 5.9* 5.4* 5.5* 5.6*  ALBUMIN 2.2* 2.0* 1.9* 1.9*   No results for input(s): LIPASE, AMYLASE in the last 168 hours. No results for input(s): AMMONIA in the last 168 hours. CBC:  Recent Labs Lab 09/01/14 0033  09/02/14 0815 09/03/14 0630 09/04/14 0542 09/05/14 0540 09/06/14 0440 09/07/14 0550  WBC  --   < > 17.1* 14.8* 12.2* 11.1* 10.3 8.0  NEUTROABS 19.7*  --  14.9* 12.4* 9.5* 8.4*  --   --   HGB 7.8*  < > 7.5* 7.4* 7.7* 7.7* 7.5* 7.3*  HCT 23.6*  < > 22.5* 22.2* 23.3* 23.5* 22.8* 22.0*  MCV  --   < > 90.7 90.6 90.0 90.7 90.8 92.1  PLT  --   < > 281 288 312 312 318 288  < > = values in this interval not displayed. Cardiac Enzymes: No results for input(s): CKTOTAL, CKMB, CKMBINDEX, TROPONINI in the last 168 hours. BNP: BNP (last 3 results) No results for input(s): BNP in the last 8760 hours.  ProBNP (last 3 results) No results for input(s): PROBNP in the last 8760 hours.  CBG:  Recent Labs Lab 09/06/14 1125 09/06/14 1626 09/06/14 2059 09/07/14 0728 09/07/14 1117  GLUCAP 146* 99 200* 200* 219*       Signed:  Yama Nielson  [facility] 09/07/2014, 1:33 PM

## 2014-09-07 NOTE — Care Management Note (Addendum)
Case Management Note  Patient Details  Name: Lance [name] MRN: [id number] Date of Birth: [birth year]  Subjective/Objective:                    Action/Plan:   Expected Discharge Date: 09/07/2014           Expected Discharge Plan:  [place]  In-House Referral:     Discharge planning Services  CM Consult  Post Acute Care Choice:    Choice offered to:     DME Arranged:    DME Agency:     HH Arranged:  RN [place] Agency:  [facility]  Status of Service:  Completed, signed off  Medicare Important Message Given:  Yes Date Medicare IM Given:  09/07/14 Medicare IM give by:  Jonnie Finner RN CCM  Date Additional Medicare IM Given:    Additional Medicare Important Message give by:     If discussed at [place] of Stay Meetings, dates discussed:    Additional Comments: NCM spoke to pt and offered choice for [facility]. Pt states he active with [facility] for [facility]. States he has an air mattress on his bed at home. Has power-wheelchair at home. Wife at home to assist with his care. Contacted [facility] for [facility] RN and IV abx for home. Made agency aware of lab needed for Vancomycin and info sent to Dr. Algis Downs office. Pt requesting ambulance transport. Has wound vac from [employer] in room. Contacted [employer] rep, Ricki Toye to verify that is his wound vac for home. Jonnie Finner RN CCM Case Mgmt phone [phone]  Erenest Rasher, RN 09/07/2014, 11:43 AM

## 2014-09-07 NOTE — Clinical Social Work Note (Signed)
Patient discharging home today and ambulance transport requested. CSW facilitated transport home with non-emergency ambulance transport ([facility]).   Maykayla Highley Givens, MSW, LCSW Licensed Clinical Social Worker [place] [phone]

## 2014-09-07 NOTE — Progress Notes (Signed)
Patient Discharge: Disposition: Patient is discharged home via non-emergency ambulance transport. Education: Educated about medications, prescriptions, insulin, diet, discharge instructions, given handout on Sepsis. IV: PICC line to be continued at discharge due to IV antibiotics.  IV team informed, flushed with heparin. Telemetry: Discontinued before discharge, CCMD notified. Belongings: Patient took all his belongings with him.  Foley discontinued before discharge, per patient he catheterizes at home and has a schedule.  Explained patient and also informed wife about the wound vac dressing change on May 13th, done by the home health and again will be going to wound center on Sep 17, 2014, understood and acknowledged.

## 2014-09-07 NOTE — Progress Notes (Signed)
NCM received call from Dr. Leafy Ro PA, one dressing change requested on 09/12/2014. NCM contacted [facility] [place] with updates to order. Jonnie Finner RN CCM Case Mgmt phone [phone]

## 2014-09-08 DIAGNOSIS — E119 Type 2 diabetes mellitus without complications: Secondary | ICD-10-CM | POA: Diagnosis not present

## 2014-09-08 DIAGNOSIS — G35 Multiple sclerosis: Secondary | ICD-10-CM | POA: Diagnosis not present

## 2014-09-08 DIAGNOSIS — L89313 Pressure ulcer of right buttock, stage 3: Secondary | ICD-10-CM | POA: Diagnosis not present

## 2014-09-08 DIAGNOSIS — M5430 Sciatica, unspecified side: Secondary | ICD-10-CM | POA: Diagnosis not present

## 2014-09-08 DIAGNOSIS — L89323 Pressure ulcer of left buttock, stage 3: Secondary | ICD-10-CM | POA: Diagnosis not present

## 2014-09-08 DIAGNOSIS — L89213 Pressure ulcer of right hip, stage 3: Secondary | ICD-10-CM | POA: Diagnosis not present

## 2014-09-08 LAB — CULTURE, BLOOD (ROUTINE X 2)
CULTURE: NO GROWTH
Culture: NO GROWTH

## 2014-09-09 DIAGNOSIS — E119 Type 2 diabetes mellitus without complications: Secondary | ICD-10-CM | POA: Diagnosis not present

## 2014-09-09 DIAGNOSIS — L89213 Pressure ulcer of right hip, stage 3: Secondary | ICD-10-CM | POA: Diagnosis not present

## 2014-09-09 DIAGNOSIS — L89323 Pressure ulcer of left buttock, stage 3: Secondary | ICD-10-CM | POA: Diagnosis not present

## 2014-09-09 DIAGNOSIS — G35 Multiple sclerosis: Secondary | ICD-10-CM | POA: Diagnosis not present

## 2014-09-09 DIAGNOSIS — M5430 Sciatica, unspecified side: Secondary | ICD-10-CM | POA: Diagnosis not present

## 2014-09-09 DIAGNOSIS — L89313 Pressure ulcer of right buttock, stage 3: Secondary | ICD-10-CM | POA: Diagnosis not present

## 2014-09-09 LAB — TISSUE CULTURE: Gram Stain: NONE SEEN

## 2014-09-10 ENCOUNTER — Encounter (HOSPITAL_COMMUNITY): Payer: Self-pay | Admitting: Plastic Surgery

## 2014-09-10 DIAGNOSIS — E119 Type 2 diabetes mellitus without complications: Secondary | ICD-10-CM | POA: Diagnosis not present

## 2014-09-10 DIAGNOSIS — G35 Multiple sclerosis: Secondary | ICD-10-CM | POA: Diagnosis not present

## 2014-09-10 DIAGNOSIS — L89323 Pressure ulcer of left buttock, stage 3: Secondary | ICD-10-CM | POA: Diagnosis not present

## 2014-09-10 DIAGNOSIS — L89213 Pressure ulcer of right hip, stage 3: Secondary | ICD-10-CM | POA: Diagnosis not present

## 2014-09-10 DIAGNOSIS — L89313 Pressure ulcer of right buttock, stage 3: Secondary | ICD-10-CM | POA: Diagnosis not present

## 2014-09-10 DIAGNOSIS — M5430 Sciatica, unspecified side: Secondary | ICD-10-CM | POA: Diagnosis not present

## 2014-09-10 LAB — ANAEROBIC CULTURE: Gram Stain: NONE SEEN

## 2014-09-11 DIAGNOSIS — L89313 Pressure ulcer of right buttock, stage 3: Secondary | ICD-10-CM | POA: Diagnosis not present

## 2014-09-11 DIAGNOSIS — G35 Multiple sclerosis: Secondary | ICD-10-CM | POA: Diagnosis not present

## 2014-09-11 DIAGNOSIS — Z5181 Encounter for therapeutic drug level monitoring: Secondary | ICD-10-CM | POA: Diagnosis not present

## 2014-09-11 DIAGNOSIS — L89323 Pressure ulcer of left buttock, stage 3: Secondary | ICD-10-CM | POA: Diagnosis not present

## 2014-09-11 DIAGNOSIS — E119 Type 2 diabetes mellitus without complications: Secondary | ICD-10-CM | POA: Diagnosis not present

## 2014-09-11 DIAGNOSIS — M5430 Sciatica, unspecified side: Secondary | ICD-10-CM | POA: Diagnosis not present

## 2014-09-11 DIAGNOSIS — L89213 Pressure ulcer of right hip, stage 3: Secondary | ICD-10-CM | POA: Diagnosis not present

## 2014-09-12 DIAGNOSIS — E119 Type 2 diabetes mellitus without complications: Secondary | ICD-10-CM | POA: Diagnosis not present

## 2014-09-12 DIAGNOSIS — L89213 Pressure ulcer of right hip, stage 3: Secondary | ICD-10-CM | POA: Diagnosis not present

## 2014-09-12 DIAGNOSIS — G35 Multiple sclerosis: Secondary | ICD-10-CM | POA: Diagnosis not present

## 2014-09-12 DIAGNOSIS — L89313 Pressure ulcer of right buttock, stage 3: Secondary | ICD-10-CM | POA: Diagnosis not present

## 2014-09-12 DIAGNOSIS — M5430 Sciatica, unspecified side: Secondary | ICD-10-CM | POA: Diagnosis not present

## 2014-09-12 DIAGNOSIS — L89323 Pressure ulcer of left buttock, stage 3: Secondary | ICD-10-CM | POA: Diagnosis not present

## 2014-09-15 DIAGNOSIS — L89213 Pressure ulcer of right hip, stage 3: Secondary | ICD-10-CM | POA: Diagnosis not present

## 2014-09-15 DIAGNOSIS — M5430 Sciatica, unspecified side: Secondary | ICD-10-CM | POA: Diagnosis not present

## 2014-09-15 DIAGNOSIS — L89323 Pressure ulcer of left buttock, stage 3: Secondary | ICD-10-CM | POA: Diagnosis not present

## 2014-09-15 DIAGNOSIS — E119 Type 2 diabetes mellitus without complications: Secondary | ICD-10-CM | POA: Diagnosis not present

## 2014-09-15 DIAGNOSIS — G35 Multiple sclerosis: Secondary | ICD-10-CM | POA: Diagnosis not present

## 2014-09-15 DIAGNOSIS — L89313 Pressure ulcer of right buttock, stage 3: Secondary | ICD-10-CM | POA: Diagnosis not present

## 2014-09-17 ENCOUNTER — Encounter (HOSPITAL_BASED_OUTPATIENT_CLINIC_OR_DEPARTMENT_OTHER): Payer: Medicare Other | Attending: Plastic Surgery

## 2014-09-17 DIAGNOSIS — E11622 Type 2 diabetes mellitus with other skin ulcer: Secondary | ICD-10-CM | POA: Diagnosis not present

## 2014-09-17 DIAGNOSIS — G35 Multiple sclerosis: Secondary | ICD-10-CM | POA: Diagnosis not present

## 2014-09-17 DIAGNOSIS — L89324 Pressure ulcer of left buttock, stage 4: Secondary | ICD-10-CM | POA: Insufficient documentation

## 2014-09-19 DIAGNOSIS — E119 Type 2 diabetes mellitus without complications: Secondary | ICD-10-CM | POA: Diagnosis not present

## 2014-09-19 DIAGNOSIS — G35 Multiple sclerosis: Secondary | ICD-10-CM | POA: Diagnosis not present

## 2014-09-19 DIAGNOSIS — Z5181 Encounter for therapeutic drug level monitoring: Secondary | ICD-10-CM | POA: Diagnosis not present

## 2014-09-19 DIAGNOSIS — L89313 Pressure ulcer of right buttock, stage 3: Secondary | ICD-10-CM | POA: Diagnosis not present

## 2014-09-19 DIAGNOSIS — M5430 Sciatica, unspecified side: Secondary | ICD-10-CM | POA: Diagnosis not present

## 2014-09-19 DIAGNOSIS — L89213 Pressure ulcer of right hip, stage 3: Secondary | ICD-10-CM | POA: Diagnosis not present

## 2014-09-19 DIAGNOSIS — L89323 Pressure ulcer of left buttock, stage 3: Secondary | ICD-10-CM | POA: Diagnosis not present

## 2014-09-20 DIAGNOSIS — L89313 Pressure ulcer of right buttock, stage 3: Secondary | ICD-10-CM | POA: Diagnosis not present

## 2014-09-20 DIAGNOSIS — M5430 Sciatica, unspecified side: Secondary | ICD-10-CM | POA: Diagnosis not present

## 2014-09-20 DIAGNOSIS — L89323 Pressure ulcer of left buttock, stage 3: Secondary | ICD-10-CM | POA: Diagnosis not present

## 2014-09-20 DIAGNOSIS — G35 Multiple sclerosis: Secondary | ICD-10-CM | POA: Diagnosis not present

## 2014-09-20 DIAGNOSIS — L89213 Pressure ulcer of right hip, stage 3: Secondary | ICD-10-CM | POA: Diagnosis not present

## 2014-09-20 DIAGNOSIS — E119 Type 2 diabetes mellitus without complications: Secondary | ICD-10-CM | POA: Diagnosis not present

## 2014-09-22 DIAGNOSIS — G35 Multiple sclerosis: Secondary | ICD-10-CM | POA: Diagnosis not present

## 2014-09-22 DIAGNOSIS — L89323 Pressure ulcer of left buttock, stage 3: Secondary | ICD-10-CM | POA: Diagnosis not present

## 2014-09-22 DIAGNOSIS — M5430 Sciatica, unspecified side: Secondary | ICD-10-CM | POA: Diagnosis not present

## 2014-09-22 DIAGNOSIS — L89213 Pressure ulcer of right hip, stage 3: Secondary | ICD-10-CM | POA: Diagnosis not present

## 2014-09-22 DIAGNOSIS — E119 Type 2 diabetes mellitus without complications: Secondary | ICD-10-CM | POA: Diagnosis not present

## 2014-09-22 DIAGNOSIS — L89313 Pressure ulcer of right buttock, stage 3: Secondary | ICD-10-CM | POA: Diagnosis not present

## 2014-09-25 DIAGNOSIS — L89213 Pressure ulcer of right hip, stage 3: Secondary | ICD-10-CM | POA: Diagnosis not present

## 2014-09-25 DIAGNOSIS — M5430 Sciatica, unspecified side: Secondary | ICD-10-CM | POA: Diagnosis not present

## 2014-09-25 DIAGNOSIS — G35 Multiple sclerosis: Secondary | ICD-10-CM | POA: Diagnosis not present

## 2014-09-25 DIAGNOSIS — L89313 Pressure ulcer of right buttock, stage 3: Secondary | ICD-10-CM | POA: Diagnosis not present

## 2014-09-25 DIAGNOSIS — E119 Type 2 diabetes mellitus without complications: Secondary | ICD-10-CM | POA: Diagnosis not present

## 2014-09-25 DIAGNOSIS — L89323 Pressure ulcer of left buttock, stage 3: Secondary | ICD-10-CM | POA: Diagnosis not present

## 2014-09-26 DIAGNOSIS — E119 Type 2 diabetes mellitus without complications: Secondary | ICD-10-CM | POA: Diagnosis not present

## 2014-09-26 DIAGNOSIS — G35 Multiple sclerosis: Secondary | ICD-10-CM | POA: Diagnosis not present

## 2014-09-26 DIAGNOSIS — L89313 Pressure ulcer of right buttock, stage 3: Secondary | ICD-10-CM | POA: Diagnosis not present

## 2014-09-26 DIAGNOSIS — Z5181 Encounter for therapeutic drug level monitoring: Secondary | ICD-10-CM | POA: Diagnosis not present

## 2014-09-26 DIAGNOSIS — M5430 Sciatica, unspecified side: Secondary | ICD-10-CM | POA: Diagnosis not present

## 2014-09-26 DIAGNOSIS — L89323 Pressure ulcer of left buttock, stage 3: Secondary | ICD-10-CM | POA: Diagnosis not present

## 2014-09-26 DIAGNOSIS — L89213 Pressure ulcer of right hip, stage 3: Secondary | ICD-10-CM | POA: Diagnosis not present

## 2014-09-27 DIAGNOSIS — E119 Type 2 diabetes mellitus without complications: Secondary | ICD-10-CM | POA: Diagnosis not present

## 2014-09-27 DIAGNOSIS — L89213 Pressure ulcer of right hip, stage 3: Secondary | ICD-10-CM | POA: Diagnosis not present

## 2014-09-27 DIAGNOSIS — L89313 Pressure ulcer of right buttock, stage 3: Secondary | ICD-10-CM | POA: Diagnosis not present

## 2014-09-27 DIAGNOSIS — G35 Multiple sclerosis: Secondary | ICD-10-CM | POA: Diagnosis not present

## 2014-09-27 DIAGNOSIS — M5430 Sciatica, unspecified side: Secondary | ICD-10-CM | POA: Diagnosis not present

## 2014-09-27 DIAGNOSIS — Z5181 Encounter for therapeutic drug level monitoring: Secondary | ICD-10-CM | POA: Diagnosis not present

## 2014-09-27 DIAGNOSIS — L89323 Pressure ulcer of left buttock, stage 3: Secondary | ICD-10-CM | POA: Diagnosis not present

## 2014-09-28 ENCOUNTER — Telehealth: Payer: Self-pay | Admitting: *Deleted

## 2014-09-28 NOTE — Telephone Encounter (Signed)
lab report from 09/26/14.  Vanc Trough = 21.4.  Report did not include CMET results.  [facility] to fax report from 09/27/14 to [facility].

## 2014-09-29 DIAGNOSIS — M5430 Sciatica, unspecified side: Secondary | ICD-10-CM | POA: Diagnosis not present

## 2014-09-29 DIAGNOSIS — G35 Multiple sclerosis: Secondary | ICD-10-CM | POA: Diagnosis not present

## 2014-09-29 DIAGNOSIS — L89213 Pressure ulcer of right hip, stage 3: Secondary | ICD-10-CM | POA: Diagnosis not present

## 2014-09-29 DIAGNOSIS — L89313 Pressure ulcer of right buttock, stage 3: Secondary | ICD-10-CM | POA: Diagnosis not present

## 2014-09-29 DIAGNOSIS — L89323 Pressure ulcer of left buttock, stage 3: Secondary | ICD-10-CM | POA: Diagnosis not present

## 2014-09-29 DIAGNOSIS — E119 Type 2 diabetes mellitus without complications: Secondary | ICD-10-CM | POA: Diagnosis not present

## 2014-09-30 DIAGNOSIS — I10 Essential (primary) hypertension: Secondary | ICD-10-CM | POA: Diagnosis not present

## 2014-09-30 DIAGNOSIS — M5430 Sciatica, unspecified side: Secondary | ICD-10-CM | POA: Diagnosis not present

## 2014-09-30 DIAGNOSIS — L89313 Pressure ulcer of right buttock, stage 3: Secondary | ICD-10-CM | POA: Diagnosis not present

## 2014-09-30 DIAGNOSIS — L89323 Pressure ulcer of left buttock, stage 3: Secondary | ICD-10-CM | POA: Diagnosis not present

## 2014-09-30 DIAGNOSIS — E119 Type 2 diabetes mellitus without complications: Secondary | ICD-10-CM | POA: Diagnosis not present

## 2014-09-30 DIAGNOSIS — L89213 Pressure ulcer of right hip, stage 3: Secondary | ICD-10-CM | POA: Diagnosis not present

## 2014-09-30 DIAGNOSIS — E1165 Type 2 diabetes mellitus with hyperglycemia: Secondary | ICD-10-CM | POA: Diagnosis not present

## 2014-09-30 DIAGNOSIS — G35 Multiple sclerosis: Secondary | ICD-10-CM | POA: Diagnosis not present

## 2014-10-01 DIAGNOSIS — Z794 Long term (current) use of insulin: Secondary | ICD-10-CM | POA: Diagnosis not present

## 2014-10-01 DIAGNOSIS — E1165 Type 2 diabetes mellitus with hyperglycemia: Secondary | ICD-10-CM | POA: Diagnosis not present

## 2014-10-01 DIAGNOSIS — L89313 Pressure ulcer of right buttock, stage 3: Secondary | ICD-10-CM | POA: Diagnosis not present

## 2014-10-01 DIAGNOSIS — L89323 Pressure ulcer of left buttock, stage 3: Secondary | ICD-10-CM | POA: Diagnosis not present

## 2014-10-01 DIAGNOSIS — Z5181 Encounter for therapeutic drug level monitoring: Secondary | ICD-10-CM | POA: Diagnosis not present

## 2014-10-01 DIAGNOSIS — Z452 Encounter for adjustment and management of vascular access device: Secondary | ICD-10-CM | POA: Diagnosis not present

## 2014-10-01 DIAGNOSIS — L89622 Pressure ulcer of left heel, stage 2: Secondary | ICD-10-CM | POA: Diagnosis not present

## 2014-10-01 DIAGNOSIS — M5430 Sciatica, unspecified side: Secondary | ICD-10-CM | POA: Diagnosis not present

## 2014-10-01 DIAGNOSIS — Z4800 Encounter for change or removal of nonsurgical wound dressing: Secondary | ICD-10-CM | POA: Diagnosis not present

## 2014-10-01 DIAGNOSIS — G35 Multiple sclerosis: Secondary | ICD-10-CM | POA: Diagnosis not present

## 2014-10-01 DIAGNOSIS — I10 Essential (primary) hypertension: Secondary | ICD-10-CM | POA: Diagnosis not present

## 2014-10-01 DIAGNOSIS — L89213 Pressure ulcer of right hip, stage 3: Secondary | ICD-10-CM | POA: Diagnosis not present

## 2014-10-02 ENCOUNTER — Telehealth: Payer: Self-pay | Admitting: *Deleted

## 2014-10-02 DIAGNOSIS — Z452 Encounter for adjustment and management of vascular access device: Secondary | ICD-10-CM | POA: Diagnosis not present

## 2014-10-02 DIAGNOSIS — Z5181 Encounter for therapeutic drug level monitoring: Secondary | ICD-10-CM | POA: Diagnosis not present

## 2014-10-02 DIAGNOSIS — L89323 Pressure ulcer of left buttock, stage 3: Secondary | ICD-10-CM | POA: Diagnosis not present

## 2014-10-02 DIAGNOSIS — L89213 Pressure ulcer of right hip, stage 3: Secondary | ICD-10-CM | POA: Diagnosis not present

## 2014-10-02 DIAGNOSIS — L89622 Pressure ulcer of left heel, stage 2: Secondary | ICD-10-CM | POA: Diagnosis not present

## 2014-10-02 DIAGNOSIS — G35 Multiple sclerosis: Secondary | ICD-10-CM | POA: Diagnosis not present

## 2014-10-02 DIAGNOSIS — E1165 Type 2 diabetes mellitus with hyperglycemia: Secondary | ICD-10-CM | POA: Diagnosis not present

## 2014-10-02 NOTE — Telephone Encounter (Signed)
09/30/14 BUN = 40, Creatinine = 1.44 (was 47 and 1.4 on 5/26).  Jeani Hawking at [facility] to page Dr. Johnnye Sima.  Landis Gandy, RN

## 2014-10-04 DIAGNOSIS — L89323 Pressure ulcer of left buttock, stage 3: Secondary | ICD-10-CM | POA: Diagnosis not present

## 2014-10-04 DIAGNOSIS — L89213 Pressure ulcer of right hip, stage 3: Secondary | ICD-10-CM | POA: Diagnosis not present

## 2014-10-04 DIAGNOSIS — Z452 Encounter for adjustment and management of vascular access device: Secondary | ICD-10-CM | POA: Diagnosis not present

## 2014-10-04 DIAGNOSIS — E1165 Type 2 diabetes mellitus with hyperglycemia: Secondary | ICD-10-CM | POA: Diagnosis not present

## 2014-10-04 DIAGNOSIS — L89622 Pressure ulcer of left heel, stage 2: Secondary | ICD-10-CM | POA: Diagnosis not present

## 2014-10-04 DIAGNOSIS — G35 Multiple sclerosis: Secondary | ICD-10-CM | POA: Diagnosis not present

## 2014-10-06 DIAGNOSIS — L89213 Pressure ulcer of right hip, stage 3: Secondary | ICD-10-CM | POA: Diagnosis not present

## 2014-10-06 DIAGNOSIS — L89323 Pressure ulcer of left buttock, stage 3: Secondary | ICD-10-CM | POA: Diagnosis not present

## 2014-10-06 DIAGNOSIS — L89622 Pressure ulcer of left heel, stage 2: Secondary | ICD-10-CM | POA: Diagnosis not present

## 2014-10-06 DIAGNOSIS — E1165 Type 2 diabetes mellitus with hyperglycemia: Secondary | ICD-10-CM | POA: Diagnosis not present

## 2014-10-06 DIAGNOSIS — G35 Multiple sclerosis: Secondary | ICD-10-CM | POA: Diagnosis not present

## 2014-10-06 DIAGNOSIS — Z452 Encounter for adjustment and management of vascular access device: Secondary | ICD-10-CM | POA: Diagnosis not present

## 2014-10-08 DIAGNOSIS — E1165 Type 2 diabetes mellitus with hyperglycemia: Secondary | ICD-10-CM | POA: Diagnosis not present

## 2014-10-08 DIAGNOSIS — G35 Multiple sclerosis: Secondary | ICD-10-CM | POA: Diagnosis not present

## 2014-10-08 DIAGNOSIS — L89213 Pressure ulcer of right hip, stage 3: Secondary | ICD-10-CM | POA: Diagnosis not present

## 2014-10-08 DIAGNOSIS — Z452 Encounter for adjustment and management of vascular access device: Secondary | ICD-10-CM | POA: Diagnosis not present

## 2014-10-08 DIAGNOSIS — L89323 Pressure ulcer of left buttock, stage 3: Secondary | ICD-10-CM | POA: Diagnosis not present

## 2014-10-08 DIAGNOSIS — L89622 Pressure ulcer of left heel, stage 2: Secondary | ICD-10-CM | POA: Diagnosis not present

## 2014-10-09 DIAGNOSIS — L89213 Pressure ulcer of right hip, stage 3: Secondary | ICD-10-CM | POA: Diagnosis not present

## 2014-10-09 DIAGNOSIS — Z452 Encounter for adjustment and management of vascular access device: Secondary | ICD-10-CM | POA: Diagnosis not present

## 2014-10-09 DIAGNOSIS — L89622 Pressure ulcer of left heel, stage 2: Secondary | ICD-10-CM | POA: Diagnosis not present

## 2014-10-09 DIAGNOSIS — L89323 Pressure ulcer of left buttock, stage 3: Secondary | ICD-10-CM | POA: Diagnosis not present

## 2014-10-09 DIAGNOSIS — E1165 Type 2 diabetes mellitus with hyperglycemia: Secondary | ICD-10-CM | POA: Diagnosis not present

## 2014-10-09 DIAGNOSIS — G35 Multiple sclerosis: Secondary | ICD-10-CM | POA: Diagnosis not present

## 2014-10-10 DIAGNOSIS — G35 Multiple sclerosis: Secondary | ICD-10-CM | POA: Diagnosis not present

## 2014-10-10 DIAGNOSIS — Z452 Encounter for adjustment and management of vascular access device: Secondary | ICD-10-CM | POA: Diagnosis not present

## 2014-10-10 DIAGNOSIS — L89323 Pressure ulcer of left buttock, stage 3: Secondary | ICD-10-CM | POA: Diagnosis not present

## 2014-10-10 DIAGNOSIS — E1165 Type 2 diabetes mellitus with hyperglycemia: Secondary | ICD-10-CM | POA: Diagnosis not present

## 2014-10-10 DIAGNOSIS — L89622 Pressure ulcer of left heel, stage 2: Secondary | ICD-10-CM | POA: Diagnosis not present

## 2014-10-10 DIAGNOSIS — Z5181 Encounter for therapeutic drug level monitoring: Secondary | ICD-10-CM | POA: Diagnosis not present

## 2014-10-10 DIAGNOSIS — L89213 Pressure ulcer of right hip, stage 3: Secondary | ICD-10-CM | POA: Diagnosis not present

## 2014-10-12 DIAGNOSIS — L89323 Pressure ulcer of left buttock, stage 3: Secondary | ICD-10-CM | POA: Diagnosis not present

## 2014-10-12 DIAGNOSIS — G35 Multiple sclerosis: Secondary | ICD-10-CM | POA: Diagnosis not present

## 2014-10-12 DIAGNOSIS — L89213 Pressure ulcer of right hip, stage 3: Secondary | ICD-10-CM | POA: Diagnosis not present

## 2014-10-12 DIAGNOSIS — L89622 Pressure ulcer of left heel, stage 2: Secondary | ICD-10-CM | POA: Diagnosis not present

## 2014-10-12 DIAGNOSIS — Z452 Encounter for adjustment and management of vascular access device: Secondary | ICD-10-CM | POA: Diagnosis not present

## 2014-10-12 DIAGNOSIS — E1165 Type 2 diabetes mellitus with hyperglycemia: Secondary | ICD-10-CM | POA: Diagnosis not present

## 2014-10-13 DIAGNOSIS — Z452 Encounter for adjustment and management of vascular access device: Secondary | ICD-10-CM | POA: Diagnosis not present

## 2014-10-13 DIAGNOSIS — L89213 Pressure ulcer of right hip, stage 3: Secondary | ICD-10-CM | POA: Diagnosis not present

## 2014-10-13 DIAGNOSIS — L89323 Pressure ulcer of left buttock, stage 3: Secondary | ICD-10-CM | POA: Diagnosis not present

## 2014-10-13 DIAGNOSIS — E1165 Type 2 diabetes mellitus with hyperglycemia: Secondary | ICD-10-CM | POA: Diagnosis not present

## 2014-10-13 DIAGNOSIS — L89622 Pressure ulcer of left heel, stage 2: Secondary | ICD-10-CM | POA: Diagnosis not present

## 2014-10-13 DIAGNOSIS — G35 Multiple sclerosis: Secondary | ICD-10-CM | POA: Diagnosis not present

## 2014-10-15 ENCOUNTER — Encounter (HOSPITAL_BASED_OUTPATIENT_CLINIC_OR_DEPARTMENT_OTHER): Payer: Medicare Other | Attending: Plastic Surgery

## 2014-10-15 DIAGNOSIS — L89324 Pressure ulcer of left buttock, stage 4: Secondary | ICD-10-CM | POA: Insufficient documentation

## 2014-10-15 DIAGNOSIS — E11622 Type 2 diabetes mellitus with other skin ulcer: Secondary | ICD-10-CM | POA: Diagnosis not present

## 2014-10-15 DIAGNOSIS — L89323 Pressure ulcer of left buttock, stage 3: Secondary | ICD-10-CM | POA: Diagnosis not present

## 2014-10-15 DIAGNOSIS — L89213 Pressure ulcer of right hip, stage 3: Secondary | ICD-10-CM | POA: Diagnosis not present

## 2014-10-15 DIAGNOSIS — L89622 Pressure ulcer of left heel, stage 2: Secondary | ICD-10-CM | POA: Diagnosis not present

## 2014-10-15 DIAGNOSIS — Z452 Encounter for adjustment and management of vascular access device: Secondary | ICD-10-CM | POA: Diagnosis not present

## 2014-10-15 DIAGNOSIS — G35 Multiple sclerosis: Secondary | ICD-10-CM | POA: Diagnosis not present

## 2014-10-15 DIAGNOSIS — E1165 Type 2 diabetes mellitus with hyperglycemia: Secondary | ICD-10-CM | POA: Diagnosis not present

## 2014-10-16 ENCOUNTER — Ambulatory Visit (INDEPENDENT_AMBULATORY_CARE_PROVIDER_SITE_OTHER): Payer: Medicare Other | Admitting: Infectious Diseases

## 2014-10-16 ENCOUNTER — Encounter: Payer: Self-pay | Admitting: Infectious Diseases

## 2014-10-16 VITALS — BP 128/72 | HR 88 | Temp 98.0°F | Ht 76.0 in | Wt 188.0 lb

## 2014-10-16 DIAGNOSIS — Z5181 Encounter for therapeutic drug level monitoring: Secondary | ICD-10-CM | POA: Diagnosis not present

## 2014-10-16 DIAGNOSIS — E1165 Type 2 diabetes mellitus with hyperglycemia: Secondary | ICD-10-CM | POA: Diagnosis not present

## 2014-10-16 DIAGNOSIS — A4102 Sepsis due to Methicillin resistant Staphylococcus aureus: Secondary | ICD-10-CM | POA: Diagnosis present

## 2014-10-16 DIAGNOSIS — G35 Multiple sclerosis: Secondary | ICD-10-CM | POA: Diagnosis not present

## 2014-10-16 DIAGNOSIS — L89622 Pressure ulcer of left heel, stage 2: Secondary | ICD-10-CM | POA: Diagnosis not present

## 2014-10-16 DIAGNOSIS — L89323 Pressure ulcer of left buttock, stage 3: Secondary | ICD-10-CM | POA: Diagnosis not present

## 2014-10-16 DIAGNOSIS — L89213 Pressure ulcer of right hip, stage 3: Secondary | ICD-10-CM | POA: Diagnosis not present

## 2014-10-16 DIAGNOSIS — Z452 Encounter for adjustment and management of vascular access device: Secondary | ICD-10-CM | POA: Diagnosis not present

## 2014-10-16 DIAGNOSIS — L89154 Pressure ulcer of sacral region, stage 4: Secondary | ICD-10-CM

## 2014-10-16 NOTE — Assessment & Plan Note (Signed)
He appears to be doing well by his hx.  Greatly appreciate Dr Leafy Ro f/u.  He has hospital bed at home is active staying offloaded. Needs f/u nutrition eval.

## 2014-10-16 NOTE — Progress Notes (Signed)
   Subjective:    Patient ID: Lance [name], male    [name]: [birth year], 74 y.o.   MRN: [id number]  HPI  74 yo M with hx of multiple sclerosis, DM2, CKD3, chronic sacral decubitus (stage IV) adm to hospital on 5-4 with fever. TEE (-). He was found to have MRSA bacteremia. He also complained of neck pain but his MRI did not show source of infection there. He was d/c home on 5-6 (day 8/42) on IV vancomycin.  His repeat BCx in hospital were (-).  Today would be day 47 anbx.  He has had no further fevers.  No problems with his PIC. His vanco dose has been adjusted.  Saw Dr [name] yesterday- wound has been healing, filling in. Vac still in place. Is hopeful that his wound will be closed next month at his plastics f/u.  Has home health nurse who helps with his wound care BIW.  Has foley catheter chronically, no problems with this. Has airflow bed.   Review of Systems  Constitutional: Negative for fever, chills and appetite change.  Genitourinary: Negative for difficulty urinating.  FSG have been variable.      Objective:   Physical Exam  Constitutional: He appears well-developed and well-nourished.  HENT:  Mouth/Throat: No oropharyngeal exudate.  Eyes: EOM are normal. Pupils are equal, round, and reactive to light.  Neck: Neck supple.  Cardiovascular: Normal rate, regular rhythm and normal heart sounds.   Pulmonary/Chest: Effort normal and breath sounds normal.  Abdominal: Soft. Bowel sounds are normal. He exhibits no distension. There is no tenderness.  Musculoskeletal:       Arms: Lymphadenopathy:    He has no cervical adenopathy.          Assessment & Plan:

## 2014-10-16 NOTE — Assessment & Plan Note (Signed)
He has completed his IV anbx Will pull his PIC.  Will repeat his BCx on Thursday when he is off anbx for 48h.  Will see him back prn

## 2014-10-18 DIAGNOSIS — Z5181 Encounter for therapeutic drug level monitoring: Secondary | ICD-10-CM | POA: Diagnosis not present

## 2014-10-18 DIAGNOSIS — Z452 Encounter for adjustment and management of vascular access device: Secondary | ICD-10-CM | POA: Diagnosis not present

## 2014-10-18 DIAGNOSIS — L89213 Pressure ulcer of right hip, stage 3: Secondary | ICD-10-CM | POA: Diagnosis not present

## 2014-10-18 DIAGNOSIS — L89323 Pressure ulcer of left buttock, stage 3: Secondary | ICD-10-CM | POA: Diagnosis not present

## 2014-10-18 DIAGNOSIS — E1165 Type 2 diabetes mellitus with hyperglycemia: Secondary | ICD-10-CM | POA: Diagnosis not present

## 2014-10-18 DIAGNOSIS — G35 Multiple sclerosis: Secondary | ICD-10-CM | POA: Diagnosis not present

## 2014-10-18 DIAGNOSIS — L89622 Pressure ulcer of left heel, stage 2: Secondary | ICD-10-CM | POA: Diagnosis not present

## 2014-10-19 DIAGNOSIS — L89323 Pressure ulcer of left buttock, stage 3: Secondary | ICD-10-CM | POA: Diagnosis not present

## 2014-10-19 DIAGNOSIS — L89622 Pressure ulcer of left heel, stage 2: Secondary | ICD-10-CM | POA: Diagnosis not present

## 2014-10-19 DIAGNOSIS — Z452 Encounter for adjustment and management of vascular access device: Secondary | ICD-10-CM | POA: Diagnosis not present

## 2014-10-19 DIAGNOSIS — G35 Multiple sclerosis: Secondary | ICD-10-CM | POA: Diagnosis not present

## 2014-10-19 DIAGNOSIS — L89213 Pressure ulcer of right hip, stage 3: Secondary | ICD-10-CM | POA: Diagnosis not present

## 2014-10-19 DIAGNOSIS — E1165 Type 2 diabetes mellitus with hyperglycemia: Secondary | ICD-10-CM | POA: Diagnosis not present

## 2014-10-20 DIAGNOSIS — L89323 Pressure ulcer of left buttock, stage 3: Secondary | ICD-10-CM | POA: Diagnosis not present

## 2014-10-20 DIAGNOSIS — L89622 Pressure ulcer of left heel, stage 2: Secondary | ICD-10-CM | POA: Diagnosis not present

## 2014-10-20 DIAGNOSIS — G35 Multiple sclerosis: Secondary | ICD-10-CM | POA: Diagnosis not present

## 2014-10-20 DIAGNOSIS — L89213 Pressure ulcer of right hip, stage 3: Secondary | ICD-10-CM | POA: Diagnosis not present

## 2014-10-20 DIAGNOSIS — Z452 Encounter for adjustment and management of vascular access device: Secondary | ICD-10-CM | POA: Diagnosis not present

## 2014-10-20 DIAGNOSIS — E1165 Type 2 diabetes mellitus with hyperglycemia: Secondary | ICD-10-CM | POA: Diagnosis not present

## 2014-10-22 DIAGNOSIS — L89323 Pressure ulcer of left buttock, stage 3: Secondary | ICD-10-CM | POA: Diagnosis not present

## 2014-10-22 DIAGNOSIS — L89213 Pressure ulcer of right hip, stage 3: Secondary | ICD-10-CM | POA: Diagnosis not present

## 2014-10-22 DIAGNOSIS — Z452 Encounter for adjustment and management of vascular access device: Secondary | ICD-10-CM | POA: Diagnosis not present

## 2014-10-22 DIAGNOSIS — L89622 Pressure ulcer of left heel, stage 2: Secondary | ICD-10-CM | POA: Diagnosis not present

## 2014-10-22 DIAGNOSIS — E1165 Type 2 diabetes mellitus with hyperglycemia: Secondary | ICD-10-CM | POA: Diagnosis not present

## 2014-10-22 DIAGNOSIS — G35 Multiple sclerosis: Secondary | ICD-10-CM | POA: Diagnosis not present

## 2014-10-23 DIAGNOSIS — L89622 Pressure ulcer of left heel, stage 2: Secondary | ICD-10-CM | POA: Diagnosis not present

## 2014-10-23 DIAGNOSIS — G35 Multiple sclerosis: Secondary | ICD-10-CM | POA: Diagnosis not present

## 2014-10-23 DIAGNOSIS — L89213 Pressure ulcer of right hip, stage 3: Secondary | ICD-10-CM | POA: Diagnosis not present

## 2014-10-23 DIAGNOSIS — Z794 Long term (current) use of insulin: Secondary | ICD-10-CM | POA: Diagnosis not present

## 2014-10-23 DIAGNOSIS — I129 Hypertensive chronic kidney disease with stage 1 through stage 4 chronic kidney disease, or unspecified chronic kidney disease: Secondary | ICD-10-CM | POA: Diagnosis not present

## 2014-10-23 DIAGNOSIS — E1165 Type 2 diabetes mellitus with hyperglycemia: Secondary | ICD-10-CM | POA: Diagnosis not present

## 2014-10-23 DIAGNOSIS — Z452 Encounter for adjustment and management of vascular access device: Secondary | ICD-10-CM | POA: Diagnosis not present

## 2014-10-23 DIAGNOSIS — N183 Chronic kidney disease, stage 3 (moderate): Secondary | ICD-10-CM | POA: Diagnosis not present

## 2014-10-23 DIAGNOSIS — L89323 Pressure ulcer of left buttock, stage 3: Secondary | ICD-10-CM | POA: Diagnosis not present

## 2014-10-23 DIAGNOSIS — E119 Type 2 diabetes mellitus without complications: Secondary | ICD-10-CM | POA: Diagnosis not present

## 2014-10-26 DIAGNOSIS — E1165 Type 2 diabetes mellitus with hyperglycemia: Secondary | ICD-10-CM | POA: Diagnosis not present

## 2014-10-26 DIAGNOSIS — L89213 Pressure ulcer of right hip, stage 3: Secondary | ICD-10-CM | POA: Diagnosis not present

## 2014-10-26 DIAGNOSIS — L89323 Pressure ulcer of left buttock, stage 3: Secondary | ICD-10-CM | POA: Diagnosis not present

## 2014-10-26 DIAGNOSIS — G35 Multiple sclerosis: Secondary | ICD-10-CM | POA: Diagnosis not present

## 2014-10-26 DIAGNOSIS — Z452 Encounter for adjustment and management of vascular access device: Secondary | ICD-10-CM | POA: Diagnosis not present

## 2014-10-26 DIAGNOSIS — L89622 Pressure ulcer of left heel, stage 2: Secondary | ICD-10-CM | POA: Diagnosis not present

## 2014-10-27 DIAGNOSIS — E1165 Type 2 diabetes mellitus with hyperglycemia: Secondary | ICD-10-CM | POA: Diagnosis not present

## 2014-10-27 DIAGNOSIS — L89622 Pressure ulcer of left heel, stage 2: Secondary | ICD-10-CM | POA: Diagnosis not present

## 2014-10-27 DIAGNOSIS — G35 Multiple sclerosis: Secondary | ICD-10-CM | POA: Diagnosis not present

## 2014-10-27 DIAGNOSIS — Z452 Encounter for adjustment and management of vascular access device: Secondary | ICD-10-CM | POA: Diagnosis not present

## 2014-10-27 DIAGNOSIS — L89323 Pressure ulcer of left buttock, stage 3: Secondary | ICD-10-CM | POA: Diagnosis not present

## 2014-10-27 DIAGNOSIS — L89213 Pressure ulcer of right hip, stage 3: Secondary | ICD-10-CM | POA: Diagnosis not present

## 2014-10-29 DIAGNOSIS — L89323 Pressure ulcer of left buttock, stage 3: Secondary | ICD-10-CM | POA: Diagnosis not present

## 2014-10-29 DIAGNOSIS — G35 Multiple sclerosis: Secondary | ICD-10-CM | POA: Diagnosis not present

## 2014-10-29 DIAGNOSIS — E1165 Type 2 diabetes mellitus with hyperglycemia: Secondary | ICD-10-CM | POA: Diagnosis not present

## 2014-10-29 DIAGNOSIS — Z452 Encounter for adjustment and management of vascular access device: Secondary | ICD-10-CM | POA: Diagnosis not present

## 2014-10-29 DIAGNOSIS — L89213 Pressure ulcer of right hip, stage 3: Secondary | ICD-10-CM | POA: Diagnosis not present

## 2014-10-29 DIAGNOSIS — L89622 Pressure ulcer of left heel, stage 2: Secondary | ICD-10-CM | POA: Diagnosis not present

## 2014-10-30 DIAGNOSIS — Z452 Encounter for adjustment and management of vascular access device: Secondary | ICD-10-CM | POA: Diagnosis not present

## 2014-10-30 DIAGNOSIS — L89622 Pressure ulcer of left heel, stage 2: Secondary | ICD-10-CM | POA: Diagnosis not present

## 2014-10-30 DIAGNOSIS — G35 Multiple sclerosis: Secondary | ICD-10-CM | POA: Diagnosis not present

## 2014-10-30 DIAGNOSIS — L89323 Pressure ulcer of left buttock, stage 3: Secondary | ICD-10-CM | POA: Diagnosis not present

## 2014-10-30 DIAGNOSIS — L89213 Pressure ulcer of right hip, stage 3: Secondary | ICD-10-CM | POA: Diagnosis not present

## 2014-10-30 DIAGNOSIS — E1165 Type 2 diabetes mellitus with hyperglycemia: Secondary | ICD-10-CM | POA: Diagnosis not present

## 2014-11-02 DIAGNOSIS — Z452 Encounter for adjustment and management of vascular access device: Secondary | ICD-10-CM | POA: Diagnosis not present

## 2014-11-02 DIAGNOSIS — L89622 Pressure ulcer of left heel, stage 2: Secondary | ICD-10-CM | POA: Diagnosis not present

## 2014-11-02 DIAGNOSIS — E1165 Type 2 diabetes mellitus with hyperglycemia: Secondary | ICD-10-CM | POA: Diagnosis not present

## 2014-11-02 DIAGNOSIS — G35 Multiple sclerosis: Secondary | ICD-10-CM | POA: Diagnosis not present

## 2014-11-02 DIAGNOSIS — L89213 Pressure ulcer of right hip, stage 3: Secondary | ICD-10-CM | POA: Diagnosis not present

## 2014-11-02 DIAGNOSIS — L89323 Pressure ulcer of left buttock, stage 3: Secondary | ICD-10-CM | POA: Diagnosis not present

## 2014-11-03 DIAGNOSIS — L89213 Pressure ulcer of right hip, stage 3: Secondary | ICD-10-CM | POA: Diagnosis not present

## 2014-11-03 DIAGNOSIS — G35 Multiple sclerosis: Secondary | ICD-10-CM | POA: Diagnosis not present

## 2014-11-03 DIAGNOSIS — E1165 Type 2 diabetes mellitus with hyperglycemia: Secondary | ICD-10-CM | POA: Diagnosis not present

## 2014-11-03 DIAGNOSIS — L89622 Pressure ulcer of left heel, stage 2: Secondary | ICD-10-CM | POA: Diagnosis not present

## 2014-11-03 DIAGNOSIS — Z452 Encounter for adjustment and management of vascular access device: Secondary | ICD-10-CM | POA: Diagnosis not present

## 2014-11-03 DIAGNOSIS — L89323 Pressure ulcer of left buttock, stage 3: Secondary | ICD-10-CM | POA: Diagnosis not present

## 2014-11-06 DIAGNOSIS — L89323 Pressure ulcer of left buttock, stage 3: Secondary | ICD-10-CM | POA: Diagnosis not present

## 2014-11-06 DIAGNOSIS — E1165 Type 2 diabetes mellitus with hyperglycemia: Secondary | ICD-10-CM | POA: Diagnosis not present

## 2014-11-06 DIAGNOSIS — G35 Multiple sclerosis: Secondary | ICD-10-CM | POA: Diagnosis not present

## 2014-11-06 DIAGNOSIS — L89213 Pressure ulcer of right hip, stage 3: Secondary | ICD-10-CM | POA: Diagnosis not present

## 2014-11-06 DIAGNOSIS — L89622 Pressure ulcer of left heel, stage 2: Secondary | ICD-10-CM | POA: Diagnosis not present

## 2014-11-06 DIAGNOSIS — Z452 Encounter for adjustment and management of vascular access device: Secondary | ICD-10-CM | POA: Diagnosis not present

## 2014-11-07 ENCOUNTER — Encounter: Payer: Self-pay | Admitting: Infectious Diseases

## 2014-11-07 DIAGNOSIS — L89323 Pressure ulcer of left buttock, stage 3: Secondary | ICD-10-CM | POA: Diagnosis not present

## 2014-11-07 DIAGNOSIS — G35 Multiple sclerosis: Secondary | ICD-10-CM | POA: Diagnosis not present

## 2014-11-07 DIAGNOSIS — E1165 Type 2 diabetes mellitus with hyperglycemia: Secondary | ICD-10-CM | POA: Diagnosis not present

## 2014-11-07 DIAGNOSIS — L89213 Pressure ulcer of right hip, stage 3: Secondary | ICD-10-CM | POA: Diagnosis not present

## 2014-11-07 DIAGNOSIS — L89622 Pressure ulcer of left heel, stage 2: Secondary | ICD-10-CM | POA: Diagnosis not present

## 2014-11-07 DIAGNOSIS — Z452 Encounter for adjustment and management of vascular access device: Secondary | ICD-10-CM | POA: Diagnosis not present

## 2014-11-09 DIAGNOSIS — L89622 Pressure ulcer of left heel, stage 2: Secondary | ICD-10-CM | POA: Diagnosis not present

## 2014-11-09 DIAGNOSIS — L89213 Pressure ulcer of right hip, stage 3: Secondary | ICD-10-CM | POA: Diagnosis not present

## 2014-11-09 DIAGNOSIS — G35 Multiple sclerosis: Secondary | ICD-10-CM | POA: Diagnosis not present

## 2014-11-09 DIAGNOSIS — Z452 Encounter for adjustment and management of vascular access device: Secondary | ICD-10-CM | POA: Diagnosis not present

## 2014-11-09 DIAGNOSIS — E1165 Type 2 diabetes mellitus with hyperglycemia: Secondary | ICD-10-CM | POA: Diagnosis not present

## 2014-11-09 DIAGNOSIS — L89323 Pressure ulcer of left buttock, stage 3: Secondary | ICD-10-CM | POA: Diagnosis not present

## 2014-11-10 DIAGNOSIS — L89622 Pressure ulcer of left heel, stage 2: Secondary | ICD-10-CM | POA: Diagnosis not present

## 2014-11-10 DIAGNOSIS — L89323 Pressure ulcer of left buttock, stage 3: Secondary | ICD-10-CM | POA: Diagnosis not present

## 2014-11-10 DIAGNOSIS — G35 Multiple sclerosis: Secondary | ICD-10-CM | POA: Diagnosis not present

## 2014-11-10 DIAGNOSIS — L89213 Pressure ulcer of right hip, stage 3: Secondary | ICD-10-CM | POA: Diagnosis not present

## 2014-11-10 DIAGNOSIS — Z452 Encounter for adjustment and management of vascular access device: Secondary | ICD-10-CM | POA: Diagnosis not present

## 2014-11-10 DIAGNOSIS — E1165 Type 2 diabetes mellitus with hyperglycemia: Secondary | ICD-10-CM | POA: Diagnosis not present

## 2014-11-12 ENCOUNTER — Encounter (HOSPITAL_BASED_OUTPATIENT_CLINIC_OR_DEPARTMENT_OTHER): Payer: Medicare Other | Attending: Plastic Surgery

## 2014-11-12 DIAGNOSIS — L89323 Pressure ulcer of left buttock, stage 3: Secondary | ICD-10-CM | POA: Insufficient documentation

## 2014-11-12 DIAGNOSIS — L89211 Pressure ulcer of right hip, stage 1: Secondary | ICD-10-CM | POA: Insufficient documentation

## 2014-11-12 DIAGNOSIS — L89622 Pressure ulcer of left heel, stage 2: Secondary | ICD-10-CM | POA: Diagnosis not present

## 2014-11-12 DIAGNOSIS — E11622 Type 2 diabetes mellitus with other skin ulcer: Secondary | ICD-10-CM | POA: Insufficient documentation

## 2014-11-12 DIAGNOSIS — L89313 Pressure ulcer of right buttock, stage 3: Secondary | ICD-10-CM | POA: Diagnosis not present

## 2014-11-12 DIAGNOSIS — E1165 Type 2 diabetes mellitus with hyperglycemia: Secondary | ICD-10-CM | POA: Diagnosis not present

## 2014-11-12 DIAGNOSIS — L89213 Pressure ulcer of right hip, stage 3: Secondary | ICD-10-CM | POA: Diagnosis not present

## 2014-11-12 DIAGNOSIS — Z452 Encounter for adjustment and management of vascular access device: Secondary | ICD-10-CM | POA: Diagnosis not present

## 2014-11-12 DIAGNOSIS — G35 Multiple sclerosis: Secondary | ICD-10-CM | POA: Diagnosis not present

## 2014-11-14 DIAGNOSIS — E1165 Type 2 diabetes mellitus with hyperglycemia: Secondary | ICD-10-CM | POA: Diagnosis not present

## 2014-11-14 DIAGNOSIS — Z452 Encounter for adjustment and management of vascular access device: Secondary | ICD-10-CM | POA: Diagnosis not present

## 2014-11-14 DIAGNOSIS — L89323 Pressure ulcer of left buttock, stage 3: Secondary | ICD-10-CM | POA: Diagnosis not present

## 2014-11-14 DIAGNOSIS — G35 Multiple sclerosis: Secondary | ICD-10-CM | POA: Diagnosis not present

## 2014-11-14 DIAGNOSIS — L89213 Pressure ulcer of right hip, stage 3: Secondary | ICD-10-CM | POA: Diagnosis not present

## 2014-11-14 DIAGNOSIS — L89622 Pressure ulcer of left heel, stage 2: Secondary | ICD-10-CM | POA: Diagnosis not present

## 2014-11-16 DIAGNOSIS — Z452 Encounter for adjustment and management of vascular access device: Secondary | ICD-10-CM | POA: Diagnosis not present

## 2014-11-16 DIAGNOSIS — E1165 Type 2 diabetes mellitus with hyperglycemia: Secondary | ICD-10-CM | POA: Diagnosis not present

## 2014-11-16 DIAGNOSIS — L89213 Pressure ulcer of right hip, stage 3: Secondary | ICD-10-CM | POA: Diagnosis not present

## 2014-11-16 DIAGNOSIS — G35 Multiple sclerosis: Secondary | ICD-10-CM | POA: Diagnosis not present

## 2014-11-16 DIAGNOSIS — L89323 Pressure ulcer of left buttock, stage 3: Secondary | ICD-10-CM | POA: Diagnosis not present

## 2014-11-16 DIAGNOSIS — L89622 Pressure ulcer of left heel, stage 2: Secondary | ICD-10-CM | POA: Diagnosis not present

## 2014-11-17 DIAGNOSIS — L89213 Pressure ulcer of right hip, stage 3: Secondary | ICD-10-CM | POA: Diagnosis not present

## 2014-11-17 DIAGNOSIS — L89323 Pressure ulcer of left buttock, stage 3: Secondary | ICD-10-CM | POA: Diagnosis not present

## 2014-11-17 DIAGNOSIS — Z452 Encounter for adjustment and management of vascular access device: Secondary | ICD-10-CM | POA: Diagnosis not present

## 2014-11-17 DIAGNOSIS — E1165 Type 2 diabetes mellitus with hyperglycemia: Secondary | ICD-10-CM | POA: Diagnosis not present

## 2014-11-17 DIAGNOSIS — G35 Multiple sclerosis: Secondary | ICD-10-CM | POA: Diagnosis not present

## 2014-11-17 DIAGNOSIS — L89622 Pressure ulcer of left heel, stage 2: Secondary | ICD-10-CM | POA: Diagnosis not present

## 2014-11-19 DIAGNOSIS — L89211 Pressure ulcer of right hip, stage 1: Secondary | ICD-10-CM | POA: Diagnosis not present

## 2014-11-19 DIAGNOSIS — E1165 Type 2 diabetes mellitus with hyperglycemia: Secondary | ICD-10-CM | POA: Diagnosis not present

## 2014-11-19 DIAGNOSIS — L89213 Pressure ulcer of right hip, stage 3: Secondary | ICD-10-CM | POA: Diagnosis not present

## 2014-11-19 DIAGNOSIS — Z452 Encounter for adjustment and management of vascular access device: Secondary | ICD-10-CM | POA: Diagnosis not present

## 2014-11-19 DIAGNOSIS — G35 Multiple sclerosis: Secondary | ICD-10-CM | POA: Diagnosis not present

## 2014-11-19 DIAGNOSIS — E11622 Type 2 diabetes mellitus with other skin ulcer: Secondary | ICD-10-CM | POA: Diagnosis not present

## 2014-11-19 DIAGNOSIS — L89323 Pressure ulcer of left buttock, stage 3: Secondary | ICD-10-CM | POA: Diagnosis not present

## 2014-11-19 DIAGNOSIS — L89313 Pressure ulcer of right buttock, stage 3: Secondary | ICD-10-CM | POA: Diagnosis not present

## 2014-11-19 DIAGNOSIS — L89622 Pressure ulcer of left heel, stage 2: Secondary | ICD-10-CM | POA: Diagnosis not present

## 2014-11-21 DIAGNOSIS — L89622 Pressure ulcer of left heel, stage 2: Secondary | ICD-10-CM | POA: Diagnosis not present

## 2014-11-21 DIAGNOSIS — G35 Multiple sclerosis: Secondary | ICD-10-CM | POA: Diagnosis not present

## 2014-11-21 DIAGNOSIS — L89213 Pressure ulcer of right hip, stage 3: Secondary | ICD-10-CM | POA: Diagnosis not present

## 2014-11-21 DIAGNOSIS — E1165 Type 2 diabetes mellitus with hyperglycemia: Secondary | ICD-10-CM | POA: Diagnosis not present

## 2014-11-21 DIAGNOSIS — Z452 Encounter for adjustment and management of vascular access device: Secondary | ICD-10-CM | POA: Diagnosis not present

## 2014-11-21 DIAGNOSIS — L89323 Pressure ulcer of left buttock, stage 3: Secondary | ICD-10-CM | POA: Diagnosis not present

## 2014-11-23 DIAGNOSIS — Z452 Encounter for adjustment and management of vascular access device: Secondary | ICD-10-CM | POA: Diagnosis not present

## 2014-11-23 DIAGNOSIS — L89213 Pressure ulcer of right hip, stage 3: Secondary | ICD-10-CM | POA: Diagnosis not present

## 2014-11-23 DIAGNOSIS — E1165 Type 2 diabetes mellitus with hyperglycemia: Secondary | ICD-10-CM | POA: Diagnosis not present

## 2014-11-23 DIAGNOSIS — L89622 Pressure ulcer of left heel, stage 2: Secondary | ICD-10-CM | POA: Diagnosis not present

## 2014-11-23 DIAGNOSIS — L89323 Pressure ulcer of left buttock, stage 3: Secondary | ICD-10-CM | POA: Diagnosis not present

## 2014-11-23 DIAGNOSIS — G35 Multiple sclerosis: Secondary | ICD-10-CM | POA: Diagnosis not present

## 2014-11-24 DIAGNOSIS — L89213 Pressure ulcer of right hip, stage 3: Secondary | ICD-10-CM | POA: Diagnosis not present

## 2014-11-24 DIAGNOSIS — L89323 Pressure ulcer of left buttock, stage 3: Secondary | ICD-10-CM | POA: Diagnosis not present

## 2014-11-24 DIAGNOSIS — L89622 Pressure ulcer of left heel, stage 2: Secondary | ICD-10-CM | POA: Diagnosis not present

## 2014-11-24 DIAGNOSIS — G35 Multiple sclerosis: Secondary | ICD-10-CM | POA: Diagnosis not present

## 2014-11-24 DIAGNOSIS — Z452 Encounter for adjustment and management of vascular access device: Secondary | ICD-10-CM | POA: Diagnosis not present

## 2014-11-24 DIAGNOSIS — E1165 Type 2 diabetes mellitus with hyperglycemia: Secondary | ICD-10-CM | POA: Diagnosis not present

## 2014-11-26 DIAGNOSIS — E1165 Type 2 diabetes mellitus with hyperglycemia: Secondary | ICD-10-CM | POA: Diagnosis not present

## 2014-11-26 DIAGNOSIS — G35 Multiple sclerosis: Secondary | ICD-10-CM | POA: Diagnosis not present

## 2014-11-26 DIAGNOSIS — L89323 Pressure ulcer of left buttock, stage 3: Secondary | ICD-10-CM | POA: Diagnosis not present

## 2014-11-26 DIAGNOSIS — L89622 Pressure ulcer of left heel, stage 2: Secondary | ICD-10-CM | POA: Diagnosis not present

## 2014-11-26 DIAGNOSIS — Z452 Encounter for adjustment and management of vascular access device: Secondary | ICD-10-CM | POA: Diagnosis not present

## 2014-11-26 DIAGNOSIS — L89213 Pressure ulcer of right hip, stage 3: Secondary | ICD-10-CM | POA: Diagnosis not present

## 2014-11-27 DIAGNOSIS — Z452 Encounter for adjustment and management of vascular access device: Secondary | ICD-10-CM | POA: Diagnosis not present

## 2014-11-27 DIAGNOSIS — L89622 Pressure ulcer of left heel, stage 2: Secondary | ICD-10-CM | POA: Diagnosis not present

## 2014-11-27 DIAGNOSIS — G35 Multiple sclerosis: Secondary | ICD-10-CM | POA: Diagnosis not present

## 2014-11-27 DIAGNOSIS — E1165 Type 2 diabetes mellitus with hyperglycemia: Secondary | ICD-10-CM | POA: Diagnosis not present

## 2014-11-27 DIAGNOSIS — L89323 Pressure ulcer of left buttock, stage 3: Secondary | ICD-10-CM | POA: Diagnosis not present

## 2014-11-27 DIAGNOSIS — L89213 Pressure ulcer of right hip, stage 3: Secondary | ICD-10-CM | POA: Diagnosis not present

## 2014-11-29 DIAGNOSIS — L89622 Pressure ulcer of left heel, stage 2: Secondary | ICD-10-CM | POA: Diagnosis not present

## 2014-11-29 DIAGNOSIS — G35 Multiple sclerosis: Secondary | ICD-10-CM | POA: Diagnosis not present

## 2014-11-29 DIAGNOSIS — Z452 Encounter for adjustment and management of vascular access device: Secondary | ICD-10-CM | POA: Diagnosis not present

## 2014-11-29 DIAGNOSIS — L89213 Pressure ulcer of right hip, stage 3: Secondary | ICD-10-CM | POA: Diagnosis not present

## 2014-11-29 DIAGNOSIS — E1165 Type 2 diabetes mellitus with hyperglycemia: Secondary | ICD-10-CM | POA: Diagnosis not present

## 2014-11-29 DIAGNOSIS — L89323 Pressure ulcer of left buttock, stage 3: Secondary | ICD-10-CM | POA: Diagnosis not present

## 2014-11-30 DIAGNOSIS — L89324 Pressure ulcer of left buttock, stage 4: Secondary | ICD-10-CM | POA: Diagnosis not present

## 2014-11-30 DIAGNOSIS — L89313 Pressure ulcer of right buttock, stage 3: Secondary | ICD-10-CM | POA: Diagnosis not present

## 2014-11-30 DIAGNOSIS — I10 Essential (primary) hypertension: Secondary | ICD-10-CM | POA: Diagnosis not present

## 2014-11-30 DIAGNOSIS — L89622 Pressure ulcer of left heel, stage 2: Secondary | ICD-10-CM | POA: Diagnosis not present

## 2014-11-30 DIAGNOSIS — G35 Multiple sclerosis: Secondary | ICD-10-CM | POA: Diagnosis not present

## 2014-11-30 DIAGNOSIS — E1165 Type 2 diabetes mellitus with hyperglycemia: Secondary | ICD-10-CM | POA: Diagnosis not present

## 2014-11-30 DIAGNOSIS — Z5181 Encounter for therapeutic drug level monitoring: Secondary | ICD-10-CM | POA: Diagnosis not present

## 2014-11-30 DIAGNOSIS — Z4800 Encounter for change or removal of nonsurgical wound dressing: Secondary | ICD-10-CM | POA: Diagnosis not present

## 2014-11-30 DIAGNOSIS — L89213 Pressure ulcer of right hip, stage 3: Secondary | ICD-10-CM | POA: Diagnosis not present

## 2014-11-30 DIAGNOSIS — Z794 Long term (current) use of insulin: Secondary | ICD-10-CM | POA: Diagnosis not present

## 2014-12-01 DIAGNOSIS — L89622 Pressure ulcer of left heel, stage 2: Secondary | ICD-10-CM | POA: Diagnosis not present

## 2014-12-01 DIAGNOSIS — E1165 Type 2 diabetes mellitus with hyperglycemia: Secondary | ICD-10-CM | POA: Diagnosis not present

## 2014-12-01 DIAGNOSIS — L89324 Pressure ulcer of left buttock, stage 4: Secondary | ICD-10-CM | POA: Diagnosis not present

## 2014-12-01 DIAGNOSIS — L89313 Pressure ulcer of right buttock, stage 3: Secondary | ICD-10-CM | POA: Diagnosis not present

## 2014-12-01 DIAGNOSIS — L89213 Pressure ulcer of right hip, stage 3: Secondary | ICD-10-CM | POA: Diagnosis not present

## 2014-12-01 DIAGNOSIS — G35 Multiple sclerosis: Secondary | ICD-10-CM | POA: Diagnosis not present

## 2014-12-03 ENCOUNTER — Encounter (HOSPITAL_BASED_OUTPATIENT_CLINIC_OR_DEPARTMENT_OTHER): Payer: Medicare Other | Attending: Plastic Surgery

## 2014-12-03 DIAGNOSIS — L89313 Pressure ulcer of right buttock, stage 3: Secondary | ICD-10-CM | POA: Insufficient documentation

## 2014-12-03 DIAGNOSIS — G35 Multiple sclerosis: Secondary | ICD-10-CM | POA: Diagnosis not present

## 2014-12-03 DIAGNOSIS — E11622 Type 2 diabetes mellitus with other skin ulcer: Secondary | ICD-10-CM | POA: Insufficient documentation

## 2014-12-03 DIAGNOSIS — L89211 Pressure ulcer of right hip, stage 1: Secondary | ICD-10-CM | POA: Insufficient documentation

## 2014-12-03 DIAGNOSIS — L89323 Pressure ulcer of left buttock, stage 3: Secondary | ICD-10-CM | POA: Insufficient documentation

## 2014-12-04 DIAGNOSIS — L89313 Pressure ulcer of right buttock, stage 3: Secondary | ICD-10-CM | POA: Diagnosis not present

## 2014-12-04 DIAGNOSIS — L89324 Pressure ulcer of left buttock, stage 4: Secondary | ICD-10-CM | POA: Diagnosis not present

## 2014-12-04 DIAGNOSIS — L89622 Pressure ulcer of left heel, stage 2: Secondary | ICD-10-CM | POA: Diagnosis not present

## 2014-12-04 DIAGNOSIS — G35 Multiple sclerosis: Secondary | ICD-10-CM | POA: Diagnosis not present

## 2014-12-04 DIAGNOSIS — L89213 Pressure ulcer of right hip, stage 3: Secondary | ICD-10-CM | POA: Diagnosis not present

## 2014-12-04 DIAGNOSIS — E1165 Type 2 diabetes mellitus with hyperglycemia: Secondary | ICD-10-CM | POA: Diagnosis not present

## 2014-12-06 DIAGNOSIS — L89313 Pressure ulcer of right buttock, stage 3: Secondary | ICD-10-CM | POA: Diagnosis not present

## 2014-12-06 DIAGNOSIS — L89622 Pressure ulcer of left heel, stage 2: Secondary | ICD-10-CM | POA: Diagnosis not present

## 2014-12-06 DIAGNOSIS — G35 Multiple sclerosis: Secondary | ICD-10-CM | POA: Diagnosis not present

## 2014-12-06 DIAGNOSIS — E1165 Type 2 diabetes mellitus with hyperglycemia: Secondary | ICD-10-CM | POA: Diagnosis not present

## 2014-12-06 DIAGNOSIS — L89324 Pressure ulcer of left buttock, stage 4: Secondary | ICD-10-CM | POA: Diagnosis not present

## 2014-12-06 DIAGNOSIS — L89213 Pressure ulcer of right hip, stage 3: Secondary | ICD-10-CM | POA: Diagnosis not present

## 2014-12-08 DIAGNOSIS — L89213 Pressure ulcer of right hip, stage 3: Secondary | ICD-10-CM | POA: Diagnosis not present

## 2014-12-08 DIAGNOSIS — L89313 Pressure ulcer of right buttock, stage 3: Secondary | ICD-10-CM | POA: Diagnosis not present

## 2014-12-08 DIAGNOSIS — L89622 Pressure ulcer of left heel, stage 2: Secondary | ICD-10-CM | POA: Diagnosis not present

## 2014-12-08 DIAGNOSIS — G35 Multiple sclerosis: Secondary | ICD-10-CM | POA: Diagnosis not present

## 2014-12-08 DIAGNOSIS — E1165 Type 2 diabetes mellitus with hyperglycemia: Secondary | ICD-10-CM | POA: Diagnosis not present

## 2014-12-08 DIAGNOSIS — L89324 Pressure ulcer of left buttock, stage 4: Secondary | ICD-10-CM | POA: Diagnosis not present

## 2014-12-10 DIAGNOSIS — L89313 Pressure ulcer of right buttock, stage 3: Secondary | ICD-10-CM | POA: Diagnosis not present

## 2014-12-10 DIAGNOSIS — L89622 Pressure ulcer of left heel, stage 2: Secondary | ICD-10-CM | POA: Diagnosis not present

## 2014-12-10 DIAGNOSIS — L89324 Pressure ulcer of left buttock, stage 4: Secondary | ICD-10-CM | POA: Diagnosis not present

## 2014-12-10 DIAGNOSIS — G35 Multiple sclerosis: Secondary | ICD-10-CM | POA: Diagnosis not present

## 2014-12-10 DIAGNOSIS — L89213 Pressure ulcer of right hip, stage 3: Secondary | ICD-10-CM | POA: Diagnosis not present

## 2014-12-10 DIAGNOSIS — E1165 Type 2 diabetes mellitus with hyperglycemia: Secondary | ICD-10-CM | POA: Diagnosis not present

## 2014-12-11 DIAGNOSIS — G35 Multiple sclerosis: Secondary | ICD-10-CM | POA: Diagnosis not present

## 2014-12-11 DIAGNOSIS — L89213 Pressure ulcer of right hip, stage 3: Secondary | ICD-10-CM | POA: Diagnosis not present

## 2014-12-11 DIAGNOSIS — E1165 Type 2 diabetes mellitus with hyperglycemia: Secondary | ICD-10-CM | POA: Diagnosis not present

## 2014-12-11 DIAGNOSIS — L89622 Pressure ulcer of left heel, stage 2: Secondary | ICD-10-CM | POA: Diagnosis not present

## 2014-12-11 DIAGNOSIS — L89324 Pressure ulcer of left buttock, stage 4: Secondary | ICD-10-CM | POA: Diagnosis not present

## 2014-12-11 DIAGNOSIS — L89313 Pressure ulcer of right buttock, stage 3: Secondary | ICD-10-CM | POA: Diagnosis not present

## 2014-12-14 DIAGNOSIS — L89622 Pressure ulcer of left heel, stage 2: Secondary | ICD-10-CM | POA: Diagnosis not present

## 2014-12-14 DIAGNOSIS — L89313 Pressure ulcer of right buttock, stage 3: Secondary | ICD-10-CM | POA: Diagnosis not present

## 2014-12-14 DIAGNOSIS — E1165 Type 2 diabetes mellitus with hyperglycemia: Secondary | ICD-10-CM | POA: Diagnosis not present

## 2014-12-14 DIAGNOSIS — G35 Multiple sclerosis: Secondary | ICD-10-CM | POA: Diagnosis not present

## 2014-12-14 DIAGNOSIS — L89213 Pressure ulcer of right hip, stage 3: Secondary | ICD-10-CM | POA: Diagnosis not present

## 2014-12-14 DIAGNOSIS — L89324 Pressure ulcer of left buttock, stage 4: Secondary | ICD-10-CM | POA: Diagnosis not present

## 2014-12-15 DIAGNOSIS — E1165 Type 2 diabetes mellitus with hyperglycemia: Secondary | ICD-10-CM | POA: Diagnosis not present

## 2014-12-15 DIAGNOSIS — L89324 Pressure ulcer of left buttock, stage 4: Secondary | ICD-10-CM | POA: Diagnosis not present

## 2014-12-15 DIAGNOSIS — L89622 Pressure ulcer of left heel, stage 2: Secondary | ICD-10-CM | POA: Diagnosis not present

## 2014-12-15 DIAGNOSIS — L89313 Pressure ulcer of right buttock, stage 3: Secondary | ICD-10-CM | POA: Diagnosis not present

## 2014-12-15 DIAGNOSIS — L89213 Pressure ulcer of right hip, stage 3: Secondary | ICD-10-CM | POA: Diagnosis not present

## 2014-12-15 DIAGNOSIS — G35 Multiple sclerosis: Secondary | ICD-10-CM | POA: Diagnosis not present

## 2014-12-17 DIAGNOSIS — L89213 Pressure ulcer of right hip, stage 3: Secondary | ICD-10-CM | POA: Diagnosis not present

## 2014-12-17 DIAGNOSIS — L89622 Pressure ulcer of left heel, stage 2: Secondary | ICD-10-CM | POA: Diagnosis not present

## 2014-12-17 DIAGNOSIS — L89313 Pressure ulcer of right buttock, stage 3: Secondary | ICD-10-CM | POA: Diagnosis not present

## 2014-12-17 DIAGNOSIS — G35 Multiple sclerosis: Secondary | ICD-10-CM | POA: Diagnosis not present

## 2014-12-17 DIAGNOSIS — E1165 Type 2 diabetes mellitus with hyperglycemia: Secondary | ICD-10-CM | POA: Diagnosis not present

## 2014-12-17 DIAGNOSIS — L89324 Pressure ulcer of left buttock, stage 4: Secondary | ICD-10-CM | POA: Diagnosis not present

## 2014-12-18 DIAGNOSIS — L89324 Pressure ulcer of left buttock, stage 4: Secondary | ICD-10-CM | POA: Diagnosis not present

## 2014-12-18 DIAGNOSIS — L89213 Pressure ulcer of right hip, stage 3: Secondary | ICD-10-CM | POA: Diagnosis not present

## 2014-12-18 DIAGNOSIS — L89313 Pressure ulcer of right buttock, stage 3: Secondary | ICD-10-CM | POA: Diagnosis not present

## 2014-12-18 DIAGNOSIS — G35 Multiple sclerosis: Secondary | ICD-10-CM | POA: Diagnosis not present

## 2014-12-18 DIAGNOSIS — L89622 Pressure ulcer of left heel, stage 2: Secondary | ICD-10-CM | POA: Diagnosis not present

## 2014-12-18 DIAGNOSIS — E1165 Type 2 diabetes mellitus with hyperglycemia: Secondary | ICD-10-CM | POA: Diagnosis not present

## 2014-12-19 DIAGNOSIS — R8299 Other abnormal findings in urine: Secondary | ICD-10-CM | POA: Diagnosis not present

## 2014-12-21 DIAGNOSIS — L89622 Pressure ulcer of left heel, stage 2: Secondary | ICD-10-CM | POA: Diagnosis not present

## 2014-12-21 DIAGNOSIS — E1165 Type 2 diabetes mellitus with hyperglycemia: Secondary | ICD-10-CM | POA: Diagnosis not present

## 2014-12-21 DIAGNOSIS — G35 Multiple sclerosis: Secondary | ICD-10-CM | POA: Diagnosis not present

## 2014-12-21 DIAGNOSIS — L89313 Pressure ulcer of right buttock, stage 3: Secondary | ICD-10-CM | POA: Diagnosis not present

## 2014-12-21 DIAGNOSIS — L89324 Pressure ulcer of left buttock, stage 4: Secondary | ICD-10-CM | POA: Diagnosis not present

## 2014-12-21 DIAGNOSIS — L89213 Pressure ulcer of right hip, stage 3: Secondary | ICD-10-CM | POA: Diagnosis not present

## 2014-12-22 DIAGNOSIS — L89622 Pressure ulcer of left heel, stage 2: Secondary | ICD-10-CM | POA: Diagnosis not present

## 2014-12-22 DIAGNOSIS — L89313 Pressure ulcer of right buttock, stage 3: Secondary | ICD-10-CM | POA: Diagnosis not present

## 2014-12-22 DIAGNOSIS — E1165 Type 2 diabetes mellitus with hyperglycemia: Secondary | ICD-10-CM | POA: Diagnosis not present

## 2014-12-22 DIAGNOSIS — G35 Multiple sclerosis: Secondary | ICD-10-CM | POA: Diagnosis not present

## 2014-12-22 DIAGNOSIS — L89324 Pressure ulcer of left buttock, stage 4: Secondary | ICD-10-CM | POA: Diagnosis not present

## 2014-12-22 DIAGNOSIS — L89213 Pressure ulcer of right hip, stage 3: Secondary | ICD-10-CM | POA: Diagnosis not present

## 2014-12-24 DIAGNOSIS — L89622 Pressure ulcer of left heel, stage 2: Secondary | ICD-10-CM | POA: Diagnosis not present

## 2014-12-24 DIAGNOSIS — L89313 Pressure ulcer of right buttock, stage 3: Secondary | ICD-10-CM | POA: Diagnosis not present

## 2014-12-24 DIAGNOSIS — I129 Hypertensive chronic kidney disease with stage 1 through stage 4 chronic kidney disease, or unspecified chronic kidney disease: Secondary | ICD-10-CM | POA: Diagnosis not present

## 2014-12-24 DIAGNOSIS — N183 Chronic kidney disease, stage 3 (moderate): Secondary | ICD-10-CM | POA: Diagnosis not present

## 2014-12-24 DIAGNOSIS — G35 Multiple sclerosis: Secondary | ICD-10-CM | POA: Diagnosis not present

## 2014-12-24 DIAGNOSIS — Z23 Encounter for immunization: Secondary | ICD-10-CM | POA: Diagnosis not present

## 2014-12-24 DIAGNOSIS — L89324 Pressure ulcer of left buttock, stage 4: Secondary | ICD-10-CM | POA: Diagnosis not present

## 2014-12-24 DIAGNOSIS — E1165 Type 2 diabetes mellitus with hyperglycemia: Secondary | ICD-10-CM | POA: Diagnosis not present

## 2014-12-24 DIAGNOSIS — N390 Urinary tract infection, site not specified: Secondary | ICD-10-CM | POA: Diagnosis not present

## 2014-12-24 DIAGNOSIS — L8990 Pressure ulcer of unspecified site, unspecified stage: Secondary | ICD-10-CM | POA: Diagnosis not present

## 2014-12-24 DIAGNOSIS — E119 Type 2 diabetes mellitus without complications: Secondary | ICD-10-CM | POA: Diagnosis not present

## 2014-12-24 DIAGNOSIS — Z794 Long term (current) use of insulin: Secondary | ICD-10-CM | POA: Diagnosis not present

## 2014-12-24 DIAGNOSIS — L89213 Pressure ulcer of right hip, stage 3: Secondary | ICD-10-CM | POA: Diagnosis not present

## 2014-12-24 DIAGNOSIS — D649 Anemia, unspecified: Secondary | ICD-10-CM | POA: Diagnosis not present

## 2014-12-25 DIAGNOSIS — L89313 Pressure ulcer of right buttock, stage 3: Secondary | ICD-10-CM | POA: Diagnosis not present

## 2014-12-25 DIAGNOSIS — G35 Multiple sclerosis: Secondary | ICD-10-CM | POA: Diagnosis not present

## 2014-12-25 DIAGNOSIS — L89213 Pressure ulcer of right hip, stage 3: Secondary | ICD-10-CM | POA: Diagnosis not present

## 2014-12-25 DIAGNOSIS — L89324 Pressure ulcer of left buttock, stage 4: Secondary | ICD-10-CM | POA: Diagnosis not present

## 2014-12-25 DIAGNOSIS — E1165 Type 2 diabetes mellitus with hyperglycemia: Secondary | ICD-10-CM | POA: Diagnosis not present

## 2014-12-25 DIAGNOSIS — L89622 Pressure ulcer of left heel, stage 2: Secondary | ICD-10-CM | POA: Diagnosis not present

## 2014-12-28 DIAGNOSIS — L89324 Pressure ulcer of left buttock, stage 4: Secondary | ICD-10-CM | POA: Diagnosis not present

## 2014-12-28 DIAGNOSIS — E1165 Type 2 diabetes mellitus with hyperglycemia: Secondary | ICD-10-CM | POA: Diagnosis not present

## 2014-12-28 DIAGNOSIS — L89313 Pressure ulcer of right buttock, stage 3: Secondary | ICD-10-CM | POA: Diagnosis not present

## 2014-12-28 DIAGNOSIS — G35 Multiple sclerosis: Secondary | ICD-10-CM | POA: Diagnosis not present

## 2014-12-28 DIAGNOSIS — L89622 Pressure ulcer of left heel, stage 2: Secondary | ICD-10-CM | POA: Diagnosis not present

## 2014-12-28 DIAGNOSIS — L89213 Pressure ulcer of right hip, stage 3: Secondary | ICD-10-CM | POA: Diagnosis not present

## 2014-12-29 DIAGNOSIS — L89313 Pressure ulcer of right buttock, stage 3: Secondary | ICD-10-CM | POA: Diagnosis not present

## 2014-12-29 DIAGNOSIS — L89622 Pressure ulcer of left heel, stage 2: Secondary | ICD-10-CM | POA: Diagnosis not present

## 2014-12-29 DIAGNOSIS — E1165 Type 2 diabetes mellitus with hyperglycemia: Secondary | ICD-10-CM | POA: Diagnosis not present

## 2014-12-29 DIAGNOSIS — G35 Multiple sclerosis: Secondary | ICD-10-CM | POA: Diagnosis not present

## 2014-12-29 DIAGNOSIS — L89324 Pressure ulcer of left buttock, stage 4: Secondary | ICD-10-CM | POA: Diagnosis not present

## 2014-12-29 DIAGNOSIS — L89213 Pressure ulcer of right hip, stage 3: Secondary | ICD-10-CM | POA: Diagnosis not present

## 2014-12-31 DIAGNOSIS — L89213 Pressure ulcer of right hip, stage 3: Secondary | ICD-10-CM | POA: Diagnosis not present

## 2014-12-31 DIAGNOSIS — G35 Multiple sclerosis: Secondary | ICD-10-CM | POA: Diagnosis not present

## 2014-12-31 DIAGNOSIS — L89313 Pressure ulcer of right buttock, stage 3: Secondary | ICD-10-CM | POA: Diagnosis not present

## 2014-12-31 DIAGNOSIS — E1165 Type 2 diabetes mellitus with hyperglycemia: Secondary | ICD-10-CM | POA: Diagnosis not present

## 2014-12-31 DIAGNOSIS — L89622 Pressure ulcer of left heel, stage 2: Secondary | ICD-10-CM | POA: Diagnosis not present

## 2014-12-31 DIAGNOSIS — L89324 Pressure ulcer of left buttock, stage 4: Secondary | ICD-10-CM | POA: Diagnosis not present

## 2015-01-02 DIAGNOSIS — E1165 Type 2 diabetes mellitus with hyperglycemia: Secondary | ICD-10-CM | POA: Diagnosis not present

## 2015-01-02 DIAGNOSIS — L89622 Pressure ulcer of left heel, stage 2: Secondary | ICD-10-CM | POA: Diagnosis not present

## 2015-01-02 DIAGNOSIS — L89324 Pressure ulcer of left buttock, stage 4: Secondary | ICD-10-CM | POA: Diagnosis not present

## 2015-01-02 DIAGNOSIS — G35 Multiple sclerosis: Secondary | ICD-10-CM | POA: Diagnosis not present

## 2015-01-02 DIAGNOSIS — L89313 Pressure ulcer of right buttock, stage 3: Secondary | ICD-10-CM | POA: Diagnosis not present

## 2015-01-02 DIAGNOSIS — L89213 Pressure ulcer of right hip, stage 3: Secondary | ICD-10-CM | POA: Diagnosis not present

## 2015-01-04 DIAGNOSIS — G35 Multiple sclerosis: Secondary | ICD-10-CM | POA: Diagnosis not present

## 2015-01-04 DIAGNOSIS — L89324 Pressure ulcer of left buttock, stage 4: Secondary | ICD-10-CM | POA: Diagnosis not present

## 2015-01-04 DIAGNOSIS — L89622 Pressure ulcer of left heel, stage 2: Secondary | ICD-10-CM | POA: Diagnosis not present

## 2015-01-04 DIAGNOSIS — L89213 Pressure ulcer of right hip, stage 3: Secondary | ICD-10-CM | POA: Diagnosis not present

## 2015-01-04 DIAGNOSIS — E1165 Type 2 diabetes mellitus with hyperglycemia: Secondary | ICD-10-CM | POA: Diagnosis not present

## 2015-01-04 DIAGNOSIS — L89313 Pressure ulcer of right buttock, stage 3: Secondary | ICD-10-CM | POA: Diagnosis not present

## 2015-01-05 DIAGNOSIS — L89622 Pressure ulcer of left heel, stage 2: Secondary | ICD-10-CM | POA: Diagnosis not present

## 2015-01-05 DIAGNOSIS — G35 Multiple sclerosis: Secondary | ICD-10-CM | POA: Diagnosis not present

## 2015-01-05 DIAGNOSIS — L89324 Pressure ulcer of left buttock, stage 4: Secondary | ICD-10-CM | POA: Diagnosis not present

## 2015-01-05 DIAGNOSIS — E1165 Type 2 diabetes mellitus with hyperglycemia: Secondary | ICD-10-CM | POA: Diagnosis not present

## 2015-01-05 DIAGNOSIS — L89313 Pressure ulcer of right buttock, stage 3: Secondary | ICD-10-CM | POA: Diagnosis not present

## 2015-01-05 DIAGNOSIS — L89213 Pressure ulcer of right hip, stage 3: Secondary | ICD-10-CM | POA: Diagnosis not present

## 2015-01-08 DIAGNOSIS — L89324 Pressure ulcer of left buttock, stage 4: Secondary | ICD-10-CM | POA: Diagnosis not present

## 2015-01-08 DIAGNOSIS — L89213 Pressure ulcer of right hip, stage 3: Secondary | ICD-10-CM | POA: Diagnosis not present

## 2015-01-08 DIAGNOSIS — E1165 Type 2 diabetes mellitus with hyperglycemia: Secondary | ICD-10-CM | POA: Diagnosis not present

## 2015-01-08 DIAGNOSIS — L89313 Pressure ulcer of right buttock, stage 3: Secondary | ICD-10-CM | POA: Diagnosis not present

## 2015-01-08 DIAGNOSIS — L89622 Pressure ulcer of left heel, stage 2: Secondary | ICD-10-CM | POA: Diagnosis not present

## 2015-01-08 DIAGNOSIS — G35 Multiple sclerosis: Secondary | ICD-10-CM | POA: Diagnosis not present

## 2015-01-11 DIAGNOSIS — L89213 Pressure ulcer of right hip, stage 3: Secondary | ICD-10-CM | POA: Diagnosis not present

## 2015-01-11 DIAGNOSIS — L89313 Pressure ulcer of right buttock, stage 3: Secondary | ICD-10-CM | POA: Diagnosis not present

## 2015-01-11 DIAGNOSIS — L89622 Pressure ulcer of left heel, stage 2: Secondary | ICD-10-CM | POA: Diagnosis not present

## 2015-01-11 DIAGNOSIS — E1165 Type 2 diabetes mellitus with hyperglycemia: Secondary | ICD-10-CM | POA: Diagnosis not present

## 2015-01-11 DIAGNOSIS — G35 Multiple sclerosis: Secondary | ICD-10-CM | POA: Diagnosis not present

## 2015-01-11 DIAGNOSIS — L89324 Pressure ulcer of left buttock, stage 4: Secondary | ICD-10-CM | POA: Diagnosis not present

## 2015-01-12 DIAGNOSIS — G35 Multiple sclerosis: Secondary | ICD-10-CM | POA: Diagnosis not present

## 2015-01-12 DIAGNOSIS — L89324 Pressure ulcer of left buttock, stage 4: Secondary | ICD-10-CM | POA: Diagnosis not present

## 2015-01-12 DIAGNOSIS — L89213 Pressure ulcer of right hip, stage 3: Secondary | ICD-10-CM | POA: Diagnosis not present

## 2015-01-12 DIAGNOSIS — L89313 Pressure ulcer of right buttock, stage 3: Secondary | ICD-10-CM | POA: Diagnosis not present

## 2015-01-12 DIAGNOSIS — L89622 Pressure ulcer of left heel, stage 2: Secondary | ICD-10-CM | POA: Diagnosis not present

## 2015-01-12 DIAGNOSIS — E1165 Type 2 diabetes mellitus with hyperglycemia: Secondary | ICD-10-CM | POA: Diagnosis not present

## 2015-01-14 ENCOUNTER — Encounter (HOSPITAL_BASED_OUTPATIENT_CLINIC_OR_DEPARTMENT_OTHER): Payer: Medicare Other | Attending: Plastic Surgery

## 2015-01-14 DIAGNOSIS — L89622 Pressure ulcer of left heel, stage 2: Secondary | ICD-10-CM | POA: Diagnosis not present

## 2015-01-14 DIAGNOSIS — L89213 Pressure ulcer of right hip, stage 3: Secondary | ICD-10-CM | POA: Diagnosis not present

## 2015-01-14 DIAGNOSIS — L89102 Pressure ulcer of unspecified part of back, stage 2: Secondary | ICD-10-CM | POA: Diagnosis not present

## 2015-01-14 DIAGNOSIS — E1165 Type 2 diabetes mellitus with hyperglycemia: Secondary | ICD-10-CM | POA: Diagnosis not present

## 2015-01-14 DIAGNOSIS — E11622 Type 2 diabetes mellitus with other skin ulcer: Secondary | ICD-10-CM | POA: Diagnosis not present

## 2015-01-14 DIAGNOSIS — Z872 Personal history of diseases of the skin and subcutaneous tissue: Secondary | ICD-10-CM | POA: Insufficient documentation

## 2015-01-14 DIAGNOSIS — L89313 Pressure ulcer of right buttock, stage 3: Secondary | ICD-10-CM | POA: Diagnosis not present

## 2015-01-14 DIAGNOSIS — L89324 Pressure ulcer of left buttock, stage 4: Secondary | ICD-10-CM | POA: Diagnosis not present

## 2015-01-14 DIAGNOSIS — G35 Multiple sclerosis: Secondary | ICD-10-CM | POA: Diagnosis not present

## 2015-01-16 DIAGNOSIS — L89213 Pressure ulcer of right hip, stage 3: Secondary | ICD-10-CM | POA: Diagnosis not present

## 2015-01-16 DIAGNOSIS — L89622 Pressure ulcer of left heel, stage 2: Secondary | ICD-10-CM | POA: Diagnosis not present

## 2015-01-16 DIAGNOSIS — L89324 Pressure ulcer of left buttock, stage 4: Secondary | ICD-10-CM | POA: Diagnosis not present

## 2015-01-16 DIAGNOSIS — L89313 Pressure ulcer of right buttock, stage 3: Secondary | ICD-10-CM | POA: Diagnosis not present

## 2015-01-16 DIAGNOSIS — E1165 Type 2 diabetes mellitus with hyperglycemia: Secondary | ICD-10-CM | POA: Diagnosis not present

## 2015-01-16 DIAGNOSIS — G35 Multiple sclerosis: Secondary | ICD-10-CM | POA: Diagnosis not present

## 2015-01-18 DIAGNOSIS — L89324 Pressure ulcer of left buttock, stage 4: Secondary | ICD-10-CM | POA: Diagnosis not present

## 2015-01-18 DIAGNOSIS — L89213 Pressure ulcer of right hip, stage 3: Secondary | ICD-10-CM | POA: Diagnosis not present

## 2015-01-18 DIAGNOSIS — L89313 Pressure ulcer of right buttock, stage 3: Secondary | ICD-10-CM | POA: Diagnosis not present

## 2015-01-18 DIAGNOSIS — G35 Multiple sclerosis: Secondary | ICD-10-CM | POA: Diagnosis not present

## 2015-01-18 DIAGNOSIS — E1165 Type 2 diabetes mellitus with hyperglycemia: Secondary | ICD-10-CM | POA: Diagnosis not present

## 2015-01-18 DIAGNOSIS — L89622 Pressure ulcer of left heel, stage 2: Secondary | ICD-10-CM | POA: Diagnosis not present

## 2015-01-19 DIAGNOSIS — E1165 Type 2 diabetes mellitus with hyperglycemia: Secondary | ICD-10-CM | POA: Diagnosis not present

## 2015-01-19 DIAGNOSIS — L89324 Pressure ulcer of left buttock, stage 4: Secondary | ICD-10-CM | POA: Diagnosis not present

## 2015-01-19 DIAGNOSIS — G35 Multiple sclerosis: Secondary | ICD-10-CM | POA: Diagnosis not present

## 2015-01-19 DIAGNOSIS — L89622 Pressure ulcer of left heel, stage 2: Secondary | ICD-10-CM | POA: Diagnosis not present

## 2015-01-19 DIAGNOSIS — L89213 Pressure ulcer of right hip, stage 3: Secondary | ICD-10-CM | POA: Diagnosis not present

## 2015-01-19 DIAGNOSIS — L89313 Pressure ulcer of right buttock, stage 3: Secondary | ICD-10-CM | POA: Diagnosis not present

## 2015-01-21 DIAGNOSIS — E1165 Type 2 diabetes mellitus with hyperglycemia: Secondary | ICD-10-CM | POA: Diagnosis not present

## 2015-01-21 DIAGNOSIS — L89622 Pressure ulcer of left heel, stage 2: Secondary | ICD-10-CM | POA: Diagnosis not present

## 2015-01-21 DIAGNOSIS — G35 Multiple sclerosis: Secondary | ICD-10-CM | POA: Diagnosis not present

## 2015-01-21 DIAGNOSIS — L89313 Pressure ulcer of right buttock, stage 3: Secondary | ICD-10-CM | POA: Diagnosis not present

## 2015-01-21 DIAGNOSIS — L89213 Pressure ulcer of right hip, stage 3: Secondary | ICD-10-CM | POA: Diagnosis not present

## 2015-01-21 DIAGNOSIS — L89324 Pressure ulcer of left buttock, stage 4: Secondary | ICD-10-CM | POA: Diagnosis not present

## 2015-01-22 DIAGNOSIS — L89213 Pressure ulcer of right hip, stage 3: Secondary | ICD-10-CM | POA: Diagnosis not present

## 2015-01-22 DIAGNOSIS — N200 Calculus of kidney: Secondary | ICD-10-CM | POA: Diagnosis not present

## 2015-01-22 DIAGNOSIS — L89324 Pressure ulcer of left buttock, stage 4: Secondary | ICD-10-CM | POA: Diagnosis not present

## 2015-01-22 DIAGNOSIS — L89622 Pressure ulcer of left heel, stage 2: Secondary | ICD-10-CM | POA: Diagnosis not present

## 2015-01-22 DIAGNOSIS — N189 Chronic kidney disease, unspecified: Secondary | ICD-10-CM | POA: Diagnosis not present

## 2015-01-22 DIAGNOSIS — L89313 Pressure ulcer of right buttock, stage 3: Secondary | ICD-10-CM | POA: Diagnosis not present

## 2015-01-22 DIAGNOSIS — N281 Cyst of kidney, acquired: Secondary | ICD-10-CM | POA: Diagnosis not present

## 2015-01-22 DIAGNOSIS — G35 Multiple sclerosis: Secondary | ICD-10-CM | POA: Diagnosis not present

## 2015-01-22 DIAGNOSIS — N319 Neuromuscular dysfunction of bladder, unspecified: Secondary | ICD-10-CM | POA: Diagnosis not present

## 2015-01-22 DIAGNOSIS — E1165 Type 2 diabetes mellitus with hyperglycemia: Secondary | ICD-10-CM | POA: Diagnosis not present

## 2015-01-24 DIAGNOSIS — L89324 Pressure ulcer of left buttock, stage 4: Secondary | ICD-10-CM | POA: Diagnosis not present

## 2015-01-24 DIAGNOSIS — G35 Multiple sclerosis: Secondary | ICD-10-CM | POA: Diagnosis not present

## 2015-01-24 DIAGNOSIS — L89313 Pressure ulcer of right buttock, stage 3: Secondary | ICD-10-CM | POA: Diagnosis not present

## 2015-01-24 DIAGNOSIS — L89213 Pressure ulcer of right hip, stage 3: Secondary | ICD-10-CM | POA: Diagnosis not present

## 2015-01-24 DIAGNOSIS — E1165 Type 2 diabetes mellitus with hyperglycemia: Secondary | ICD-10-CM | POA: Diagnosis not present

## 2015-01-24 DIAGNOSIS — L89622 Pressure ulcer of left heel, stage 2: Secondary | ICD-10-CM | POA: Diagnosis not present

## 2015-01-26 DIAGNOSIS — L89213 Pressure ulcer of right hip, stage 3: Secondary | ICD-10-CM | POA: Diagnosis not present

## 2015-01-26 DIAGNOSIS — G35 Multiple sclerosis: Secondary | ICD-10-CM | POA: Diagnosis not present

## 2015-01-26 DIAGNOSIS — E1165 Type 2 diabetes mellitus with hyperglycemia: Secondary | ICD-10-CM | POA: Diagnosis not present

## 2015-01-26 DIAGNOSIS — L89324 Pressure ulcer of left buttock, stage 4: Secondary | ICD-10-CM | POA: Diagnosis not present

## 2015-01-26 DIAGNOSIS — L89313 Pressure ulcer of right buttock, stage 3: Secondary | ICD-10-CM | POA: Diagnosis not present

## 2015-01-26 DIAGNOSIS — L89622 Pressure ulcer of left heel, stage 2: Secondary | ICD-10-CM | POA: Diagnosis not present

## 2015-01-29 DIAGNOSIS — E1165 Type 2 diabetes mellitus with hyperglycemia: Secondary | ICD-10-CM | POA: Diagnosis not present

## 2015-01-29 DIAGNOSIS — L89313 Pressure ulcer of right buttock, stage 3: Secondary | ICD-10-CM | POA: Diagnosis not present

## 2015-01-29 DIAGNOSIS — L89622 Pressure ulcer of left heel, stage 2: Secondary | ICD-10-CM | POA: Diagnosis not present

## 2015-01-29 DIAGNOSIS — L89213 Pressure ulcer of right hip, stage 3: Secondary | ICD-10-CM | POA: Diagnosis not present

## 2015-01-29 DIAGNOSIS — L89324 Pressure ulcer of left buttock, stage 4: Secondary | ICD-10-CM | POA: Diagnosis not present

## 2015-01-29 DIAGNOSIS — G35 Multiple sclerosis: Secondary | ICD-10-CM | POA: Diagnosis not present

## 2015-01-29 DIAGNOSIS — I10 Essential (primary) hypertension: Secondary | ICD-10-CM | POA: Diagnosis not present

## 2015-01-29 DIAGNOSIS — Z794 Long term (current) use of insulin: Secondary | ICD-10-CM | POA: Diagnosis not present

## 2015-02-05 DIAGNOSIS — L89213 Pressure ulcer of right hip, stage 3: Secondary | ICD-10-CM | POA: Diagnosis not present

## 2015-02-05 DIAGNOSIS — L89313 Pressure ulcer of right buttock, stage 3: Secondary | ICD-10-CM | POA: Diagnosis not present

## 2015-02-05 DIAGNOSIS — L89324 Pressure ulcer of left buttock, stage 4: Secondary | ICD-10-CM | POA: Diagnosis not present

## 2015-02-05 DIAGNOSIS — L89622 Pressure ulcer of left heel, stage 2: Secondary | ICD-10-CM | POA: Diagnosis not present

## 2015-02-05 DIAGNOSIS — G35 Multiple sclerosis: Secondary | ICD-10-CM | POA: Diagnosis not present

## 2015-02-05 DIAGNOSIS — E1165 Type 2 diabetes mellitus with hyperglycemia: Secondary | ICD-10-CM | POA: Diagnosis not present

## 2015-02-08 DIAGNOSIS — E1165 Type 2 diabetes mellitus with hyperglycemia: Secondary | ICD-10-CM | POA: Diagnosis not present

## 2015-02-08 DIAGNOSIS — L89324 Pressure ulcer of left buttock, stage 4: Secondary | ICD-10-CM | POA: Diagnosis not present

## 2015-02-08 DIAGNOSIS — L89313 Pressure ulcer of right buttock, stage 3: Secondary | ICD-10-CM | POA: Diagnosis not present

## 2015-02-08 DIAGNOSIS — L89622 Pressure ulcer of left heel, stage 2: Secondary | ICD-10-CM | POA: Diagnosis not present

## 2015-02-08 DIAGNOSIS — L89213 Pressure ulcer of right hip, stage 3: Secondary | ICD-10-CM | POA: Diagnosis not present

## 2015-02-08 DIAGNOSIS — G35 Multiple sclerosis: Secondary | ICD-10-CM | POA: Diagnosis not present

## 2015-02-11 ENCOUNTER — Encounter (HOSPITAL_BASED_OUTPATIENT_CLINIC_OR_DEPARTMENT_OTHER): Payer: Medicare Other | Attending: Plastic Surgery

## 2015-02-11 DIAGNOSIS — G35 Multiple sclerosis: Secondary | ICD-10-CM | POA: Diagnosis not present

## 2015-02-11 DIAGNOSIS — E1165 Type 2 diabetes mellitus with hyperglycemia: Secondary | ICD-10-CM | POA: Diagnosis not present

## 2015-02-11 DIAGNOSIS — L89102 Pressure ulcer of unspecified part of back, stage 2: Secondary | ICD-10-CM | POA: Insufficient documentation

## 2015-02-11 DIAGNOSIS — E11622 Type 2 diabetes mellitus with other skin ulcer: Secondary | ICD-10-CM | POA: Diagnosis not present

## 2015-02-11 DIAGNOSIS — L89324 Pressure ulcer of left buttock, stage 4: Secondary | ICD-10-CM | POA: Diagnosis not present

## 2015-02-11 DIAGNOSIS — L89213 Pressure ulcer of right hip, stage 3: Secondary | ICD-10-CM | POA: Diagnosis not present

## 2015-02-11 DIAGNOSIS — L89313 Pressure ulcer of right buttock, stage 3: Secondary | ICD-10-CM | POA: Diagnosis not present

## 2015-02-11 DIAGNOSIS — L89622 Pressure ulcer of left heel, stage 2: Secondary | ICD-10-CM | POA: Diagnosis not present

## 2015-02-15 DIAGNOSIS — E1165 Type 2 diabetes mellitus with hyperglycemia: Secondary | ICD-10-CM | POA: Diagnosis not present

## 2015-02-15 DIAGNOSIS — G35 Multiple sclerosis: Secondary | ICD-10-CM | POA: Diagnosis not present

## 2015-02-15 DIAGNOSIS — L89313 Pressure ulcer of right buttock, stage 3: Secondary | ICD-10-CM | POA: Diagnosis not present

## 2015-02-15 DIAGNOSIS — L89213 Pressure ulcer of right hip, stage 3: Secondary | ICD-10-CM | POA: Diagnosis not present

## 2015-02-15 DIAGNOSIS — L89324 Pressure ulcer of left buttock, stage 4: Secondary | ICD-10-CM | POA: Diagnosis not present

## 2015-02-15 DIAGNOSIS — L89622 Pressure ulcer of left heel, stage 2: Secondary | ICD-10-CM | POA: Diagnosis not present

## 2015-02-19 DIAGNOSIS — L89313 Pressure ulcer of right buttock, stage 3: Secondary | ICD-10-CM | POA: Diagnosis not present

## 2015-02-19 DIAGNOSIS — G35 Multiple sclerosis: Secondary | ICD-10-CM | POA: Diagnosis not present

## 2015-02-19 DIAGNOSIS — E1165 Type 2 diabetes mellitus with hyperglycemia: Secondary | ICD-10-CM | POA: Diagnosis not present

## 2015-02-19 DIAGNOSIS — L89213 Pressure ulcer of right hip, stage 3: Secondary | ICD-10-CM | POA: Diagnosis not present

## 2015-02-19 DIAGNOSIS — L89324 Pressure ulcer of left buttock, stage 4: Secondary | ICD-10-CM | POA: Diagnosis not present

## 2015-02-19 DIAGNOSIS — L89622 Pressure ulcer of left heel, stage 2: Secondary | ICD-10-CM | POA: Diagnosis not present

## 2015-02-22 DIAGNOSIS — E1165 Type 2 diabetes mellitus with hyperglycemia: Secondary | ICD-10-CM | POA: Diagnosis not present

## 2015-02-22 DIAGNOSIS — L89213 Pressure ulcer of right hip, stage 3: Secondary | ICD-10-CM | POA: Diagnosis not present

## 2015-02-22 DIAGNOSIS — G35 Multiple sclerosis: Secondary | ICD-10-CM | POA: Diagnosis not present

## 2015-02-22 DIAGNOSIS — L89324 Pressure ulcer of left buttock, stage 4: Secondary | ICD-10-CM | POA: Diagnosis not present

## 2015-02-22 DIAGNOSIS — L89622 Pressure ulcer of left heel, stage 2: Secondary | ICD-10-CM | POA: Diagnosis not present

## 2015-02-22 DIAGNOSIS — L89313 Pressure ulcer of right buttock, stage 3: Secondary | ICD-10-CM | POA: Diagnosis not present

## 2015-02-25 DIAGNOSIS — L89324 Pressure ulcer of left buttock, stage 4: Secondary | ICD-10-CM | POA: Diagnosis not present

## 2015-02-25 DIAGNOSIS — E1165 Type 2 diabetes mellitus with hyperglycemia: Secondary | ICD-10-CM | POA: Diagnosis not present

## 2015-02-25 DIAGNOSIS — E11622 Type 2 diabetes mellitus with other skin ulcer: Secondary | ICD-10-CM | POA: Diagnosis not present

## 2015-02-25 DIAGNOSIS — L89102 Pressure ulcer of unspecified part of back, stage 2: Secondary | ICD-10-CM | POA: Diagnosis not present

## 2015-02-25 DIAGNOSIS — L89213 Pressure ulcer of right hip, stage 3: Secondary | ICD-10-CM | POA: Diagnosis not present

## 2015-02-25 DIAGNOSIS — L89313 Pressure ulcer of right buttock, stage 3: Secondary | ICD-10-CM | POA: Diagnosis not present

## 2015-02-25 DIAGNOSIS — G35 Multiple sclerosis: Secondary | ICD-10-CM | POA: Diagnosis not present

## 2015-02-25 DIAGNOSIS — L89622 Pressure ulcer of left heel, stage 2: Secondary | ICD-10-CM | POA: Diagnosis not present

## 2015-02-26 DIAGNOSIS — G35 Multiple sclerosis: Secondary | ICD-10-CM | POA: Diagnosis not present

## 2015-02-26 DIAGNOSIS — L89313 Pressure ulcer of right buttock, stage 3: Secondary | ICD-10-CM | POA: Diagnosis not present

## 2015-02-26 DIAGNOSIS — L89622 Pressure ulcer of left heel, stage 2: Secondary | ICD-10-CM | POA: Diagnosis not present

## 2015-02-26 DIAGNOSIS — L89213 Pressure ulcer of right hip, stage 3: Secondary | ICD-10-CM | POA: Diagnosis not present

## 2015-02-26 DIAGNOSIS — L89324 Pressure ulcer of left buttock, stage 4: Secondary | ICD-10-CM | POA: Diagnosis not present

## 2015-02-26 DIAGNOSIS — E1165 Type 2 diabetes mellitus with hyperglycemia: Secondary | ICD-10-CM | POA: Diagnosis not present

## 2015-03-01 DIAGNOSIS — L89313 Pressure ulcer of right buttock, stage 3: Secondary | ICD-10-CM | POA: Diagnosis not present

## 2015-03-01 DIAGNOSIS — L89213 Pressure ulcer of right hip, stage 3: Secondary | ICD-10-CM | POA: Diagnosis not present

## 2015-03-01 DIAGNOSIS — L89622 Pressure ulcer of left heel, stage 2: Secondary | ICD-10-CM | POA: Diagnosis not present

## 2015-03-01 DIAGNOSIS — E1165 Type 2 diabetes mellitus with hyperglycemia: Secondary | ICD-10-CM | POA: Diagnosis not present

## 2015-03-01 DIAGNOSIS — L89324 Pressure ulcer of left buttock, stage 4: Secondary | ICD-10-CM | POA: Diagnosis not present

## 2015-03-01 DIAGNOSIS — G35 Multiple sclerosis: Secondary | ICD-10-CM | POA: Diagnosis not present

## 2015-03-04 DIAGNOSIS — G35 Multiple sclerosis: Secondary | ICD-10-CM | POA: Diagnosis not present

## 2015-03-04 DIAGNOSIS — L89313 Pressure ulcer of right buttock, stage 3: Secondary | ICD-10-CM | POA: Diagnosis not present

## 2015-03-04 DIAGNOSIS — L89622 Pressure ulcer of left heel, stage 2: Secondary | ICD-10-CM | POA: Diagnosis not present

## 2015-03-04 DIAGNOSIS — E1165 Type 2 diabetes mellitus with hyperglycemia: Secondary | ICD-10-CM | POA: Diagnosis not present

## 2015-03-04 DIAGNOSIS — L89213 Pressure ulcer of right hip, stage 3: Secondary | ICD-10-CM | POA: Diagnosis not present

## 2015-03-04 DIAGNOSIS — L89324 Pressure ulcer of left buttock, stage 4: Secondary | ICD-10-CM | POA: Diagnosis not present

## 2015-03-08 DIAGNOSIS — M549 Dorsalgia, unspecified: Secondary | ICD-10-CM | POA: Diagnosis not present

## 2015-03-11 DIAGNOSIS — E1165 Type 2 diabetes mellitus with hyperglycemia: Secondary | ICD-10-CM | POA: Diagnosis not present

## 2015-03-11 DIAGNOSIS — L89622 Pressure ulcer of left heel, stage 2: Secondary | ICD-10-CM | POA: Diagnosis not present

## 2015-03-11 DIAGNOSIS — G35 Multiple sclerosis: Secondary | ICD-10-CM | POA: Diagnosis not present

## 2015-03-11 DIAGNOSIS — L89313 Pressure ulcer of right buttock, stage 3: Secondary | ICD-10-CM | POA: Diagnosis not present

## 2015-03-11 DIAGNOSIS — L89213 Pressure ulcer of right hip, stage 3: Secondary | ICD-10-CM | POA: Diagnosis not present

## 2015-03-11 DIAGNOSIS — L89324 Pressure ulcer of left buttock, stage 4: Secondary | ICD-10-CM | POA: Diagnosis not present

## 2015-03-15 DIAGNOSIS — E1165 Type 2 diabetes mellitus with hyperglycemia: Secondary | ICD-10-CM | POA: Diagnosis not present

## 2015-03-15 DIAGNOSIS — L89622 Pressure ulcer of left heel, stage 2: Secondary | ICD-10-CM | POA: Diagnosis not present

## 2015-03-15 DIAGNOSIS — L89324 Pressure ulcer of left buttock, stage 4: Secondary | ICD-10-CM | POA: Diagnosis not present

## 2015-03-15 DIAGNOSIS — L89213 Pressure ulcer of right hip, stage 3: Secondary | ICD-10-CM | POA: Diagnosis not present

## 2015-03-15 DIAGNOSIS — G35 Multiple sclerosis: Secondary | ICD-10-CM | POA: Diagnosis not present

## 2015-03-15 DIAGNOSIS — L89313 Pressure ulcer of right buttock, stage 3: Secondary | ICD-10-CM | POA: Diagnosis not present

## 2015-03-18 ENCOUNTER — Encounter (HOSPITAL_BASED_OUTPATIENT_CLINIC_OR_DEPARTMENT_OTHER): Payer: Medicare Other | Attending: Plastic Surgery

## 2015-03-18 DIAGNOSIS — G35 Multiple sclerosis: Secondary | ICD-10-CM | POA: Insufficient documentation

## 2015-03-18 DIAGNOSIS — L89213 Pressure ulcer of right hip, stage 3: Secondary | ICD-10-CM | POA: Diagnosis not present

## 2015-03-18 DIAGNOSIS — E119 Type 2 diabetes mellitus without complications: Secondary | ICD-10-CM | POA: Diagnosis not present

## 2015-03-18 DIAGNOSIS — L89211 Pressure ulcer of right hip, stage 1: Secondary | ICD-10-CM | POA: Diagnosis not present

## 2015-03-18 DIAGNOSIS — L89622 Pressure ulcer of left heel, stage 2: Secondary | ICD-10-CM | POA: Diagnosis not present

## 2015-03-18 DIAGNOSIS — E1165 Type 2 diabetes mellitus with hyperglycemia: Secondary | ICD-10-CM | POA: Diagnosis not present

## 2015-03-18 DIAGNOSIS — L89324 Pressure ulcer of left buttock, stage 4: Secondary | ICD-10-CM | POA: Diagnosis not present

## 2015-03-18 DIAGNOSIS — L89313 Pressure ulcer of right buttock, stage 3: Secondary | ICD-10-CM | POA: Diagnosis not present

## 2015-03-18 DIAGNOSIS — L89314 Pressure ulcer of right buttock, stage 4: Secondary | ICD-10-CM | POA: Diagnosis not present

## 2015-03-21 ENCOUNTER — Encounter: Payer: Self-pay | Admitting: Infectious Diseases

## 2015-03-22 DIAGNOSIS — L8990 Pressure ulcer of unspecified site, unspecified stage: Secondary | ICD-10-CM | POA: Diagnosis not present

## 2015-03-22 DIAGNOSIS — D649 Anemia, unspecified: Secondary | ICD-10-CM | POA: Diagnosis not present

## 2015-03-22 DIAGNOSIS — E7800 Pure hypercholesterolemia, unspecified: Secondary | ICD-10-CM | POA: Diagnosis not present

## 2015-03-22 DIAGNOSIS — E119 Type 2 diabetes mellitus without complications: Secondary | ICD-10-CM | POA: Diagnosis not present

## 2015-03-22 DIAGNOSIS — Z794 Long term (current) use of insulin: Secondary | ICD-10-CM | POA: Diagnosis not present

## 2015-03-22 DIAGNOSIS — Z1389 Encounter for screening for other disorder: Secondary | ICD-10-CM | POA: Diagnosis not present

## 2015-03-22 DIAGNOSIS — N183 Chronic kidney disease, stage 3 (moderate): Secondary | ICD-10-CM | POA: Diagnosis not present

## 2015-03-22 DIAGNOSIS — I129 Hypertensive chronic kidney disease with stage 1 through stage 4 chronic kidney disease, or unspecified chronic kidney disease: Secondary | ICD-10-CM | POA: Diagnosis not present

## 2015-03-25 DIAGNOSIS — E1165 Type 2 diabetes mellitus with hyperglycemia: Secondary | ICD-10-CM | POA: Diagnosis not present

## 2015-03-25 DIAGNOSIS — L89324 Pressure ulcer of left buttock, stage 4: Secondary | ICD-10-CM | POA: Diagnosis not present

## 2015-03-25 DIAGNOSIS — L89622 Pressure ulcer of left heel, stage 2: Secondary | ICD-10-CM | POA: Diagnosis not present

## 2015-03-25 DIAGNOSIS — G35 Multiple sclerosis: Secondary | ICD-10-CM | POA: Diagnosis not present

## 2015-03-25 DIAGNOSIS — L89313 Pressure ulcer of right buttock, stage 3: Secondary | ICD-10-CM | POA: Diagnosis not present

## 2015-03-25 DIAGNOSIS — L89213 Pressure ulcer of right hip, stage 3: Secondary | ICD-10-CM | POA: Diagnosis not present

## 2015-03-29 DIAGNOSIS — E1165 Type 2 diabetes mellitus with hyperglycemia: Secondary | ICD-10-CM | POA: Diagnosis not present

## 2015-03-29 DIAGNOSIS — L89313 Pressure ulcer of right buttock, stage 3: Secondary | ICD-10-CM | POA: Diagnosis not present

## 2015-03-29 DIAGNOSIS — L89213 Pressure ulcer of right hip, stage 3: Secondary | ICD-10-CM | POA: Diagnosis not present

## 2015-03-29 DIAGNOSIS — L89324 Pressure ulcer of left buttock, stage 4: Secondary | ICD-10-CM | POA: Diagnosis not present

## 2015-03-29 DIAGNOSIS — L89622 Pressure ulcer of left heel, stage 2: Secondary | ICD-10-CM | POA: Diagnosis not present

## 2015-03-29 DIAGNOSIS — G35 Multiple sclerosis: Secondary | ICD-10-CM | POA: Diagnosis not present

## 2015-04-08 ENCOUNTER — Encounter (HOSPITAL_BASED_OUTPATIENT_CLINIC_OR_DEPARTMENT_OTHER): Payer: Medicare Other | Attending: Plastic Surgery

## 2015-04-08 DIAGNOSIS — G35 Multiple sclerosis: Secondary | ICD-10-CM | POA: Diagnosis not present

## 2015-04-08 DIAGNOSIS — E119 Type 2 diabetes mellitus without complications: Secondary | ICD-10-CM | POA: Diagnosis not present

## 2015-04-08 DIAGNOSIS — L89212 Pressure ulcer of right hip, stage 2: Secondary | ICD-10-CM | POA: Diagnosis not present

## 2015-04-08 DIAGNOSIS — L89324 Pressure ulcer of left buttock, stage 4: Secondary | ICD-10-CM | POA: Insufficient documentation

## 2015-04-18 DIAGNOSIS — E119 Type 2 diabetes mellitus without complications: Secondary | ICD-10-CM | POA: Diagnosis not present

## 2015-04-18 DIAGNOSIS — G35 Multiple sclerosis: Secondary | ICD-10-CM | POA: Diagnosis not present

## 2015-04-18 DIAGNOSIS — L89324 Pressure ulcer of left buttock, stage 4: Secondary | ICD-10-CM | POA: Diagnosis not present

## 2015-04-18 DIAGNOSIS — L89212 Pressure ulcer of right hip, stage 2: Secondary | ICD-10-CM | POA: Diagnosis not present

## 2015-05-13 ENCOUNTER — Encounter (HOSPITAL_BASED_OUTPATIENT_CLINIC_OR_DEPARTMENT_OTHER): Payer: Medicare Other | Attending: Internal Medicine

## 2015-05-13 DIAGNOSIS — L89219 Pressure ulcer of right hip, unspecified stage: Secondary | ICD-10-CM | POA: Insufficient documentation

## 2015-05-13 DIAGNOSIS — G35 Multiple sclerosis: Secondary | ICD-10-CM | POA: Insufficient documentation

## 2015-05-13 DIAGNOSIS — Z8614 Personal history of Methicillin resistant Staphylococcus aureus infection: Secondary | ICD-10-CM | POA: Insufficient documentation

## 2015-05-13 DIAGNOSIS — E119 Type 2 diabetes mellitus without complications: Secondary | ICD-10-CM | POA: Insufficient documentation

## 2015-05-13 DIAGNOSIS — L89323 Pressure ulcer of left buttock, stage 3: Secondary | ICD-10-CM | POA: Insufficient documentation

## 2015-05-13 DIAGNOSIS — L89313 Pressure ulcer of right buttock, stage 3: Secondary | ICD-10-CM | POA: Insufficient documentation

## 2015-05-20 DIAGNOSIS — E119 Type 2 diabetes mellitus without complications: Secondary | ICD-10-CM | POA: Diagnosis not present

## 2015-05-20 DIAGNOSIS — L89313 Pressure ulcer of right buttock, stage 3: Secondary | ICD-10-CM | POA: Diagnosis not present

## 2015-05-20 DIAGNOSIS — L89219 Pressure ulcer of right hip, unspecified stage: Secondary | ICD-10-CM | POA: Diagnosis not present

## 2015-05-20 DIAGNOSIS — G35 Multiple sclerosis: Secondary | ICD-10-CM | POA: Diagnosis not present

## 2015-05-20 DIAGNOSIS — L89323 Pressure ulcer of left buttock, stage 3: Secondary | ICD-10-CM | POA: Diagnosis not present

## 2015-05-20 DIAGNOSIS — Z8614 Personal history of Methicillin resistant Staphylococcus aureus infection: Secondary | ICD-10-CM | POA: Diagnosis not present

## 2015-05-20 DIAGNOSIS — L89212 Pressure ulcer of right hip, stage 2: Secondary | ICD-10-CM | POA: Diagnosis not present

## 2015-05-28 ENCOUNTER — Other Ambulatory Visit: Payer: Self-pay | Admitting: Internal Medicine

## 2015-05-28 ENCOUNTER — Other Ambulatory Visit (HOSPITAL_COMMUNITY)
Admission: RE | Admit: 2015-05-28 | Discharge: 2015-05-28 | Disposition: A | Discharge status: Home / Self Care | Payer: Medicare Other | Source: Ambulatory Visit | Attending: Internal Medicine | Admitting: Internal Medicine

## 2015-05-28 ENCOUNTER — Ambulatory Visit (HOSPITAL_COMMUNITY)
Admission: RE | Admit: 2015-05-28 | Discharge: 2015-05-28 | Disposition: A | Discharge status: Home / Self Care | Payer: Medicare Other | Source: Ambulatory Visit | Attending: Internal Medicine | Admitting: Internal Medicine

## 2015-05-28 DIAGNOSIS — G35 Multiple sclerosis: Secondary | ICD-10-CM | POA: Diagnosis not present

## 2015-05-28 DIAGNOSIS — L89212 Pressure ulcer of right hip, stage 2: Secondary | ICD-10-CM | POA: Diagnosis not present

## 2015-05-28 DIAGNOSIS — L89323 Pressure ulcer of left buttock, stage 3: Secondary | ICD-10-CM | POA: Diagnosis not present

## 2015-05-28 DIAGNOSIS — M8610 Other acute osteomyelitis, unspecified site: Secondary | ICD-10-CM | POA: Diagnosis not present

## 2015-05-28 DIAGNOSIS — L89313 Pressure ulcer of right buttock, stage 3: Secondary | ICD-10-CM | POA: Diagnosis not present

## 2015-05-28 DIAGNOSIS — S71001D Unspecified open wound, right hip, subsequent encounter: Secondary | ICD-10-CM | POA: Diagnosis not present

## 2015-06-03 DIAGNOSIS — L89212 Pressure ulcer of right hip, stage 2: Secondary | ICD-10-CM | POA: Diagnosis not present

## 2015-06-03 DIAGNOSIS — Z8614 Personal history of Methicillin resistant Staphylococcus aureus infection: Secondary | ICD-10-CM | POA: Diagnosis not present

## 2015-06-03 DIAGNOSIS — L89313 Pressure ulcer of right buttock, stage 3: Secondary | ICD-10-CM | POA: Diagnosis not present

## 2015-06-03 DIAGNOSIS — L89323 Pressure ulcer of left buttock, stage 3: Secondary | ICD-10-CM | POA: Diagnosis not present

## 2015-06-03 DIAGNOSIS — E119 Type 2 diabetes mellitus without complications: Secondary | ICD-10-CM | POA: Diagnosis not present

## 2015-06-03 DIAGNOSIS — L89219 Pressure ulcer of right hip, unspecified stage: Secondary | ICD-10-CM | POA: Diagnosis not present

## 2015-06-03 DIAGNOSIS — G35 Multiple sclerosis: Secondary | ICD-10-CM | POA: Diagnosis not present

## 2015-06-14 DIAGNOSIS — L89324 Pressure ulcer of left buttock, stage 4: Secondary | ICD-10-CM | POA: Diagnosis not present

## 2015-06-14 DIAGNOSIS — L89213 Pressure ulcer of right hip, stage 3: Secondary | ICD-10-CM | POA: Diagnosis not present

## 2015-06-14 DIAGNOSIS — L89209 Pressure ulcer of unspecified hip, unspecified stage: Secondary | ICD-10-CM | POA: Insufficient documentation

## 2015-06-14 DIAGNOSIS — L89319 Pressure ulcer of right buttock, unspecified stage: Secondary | ICD-10-CM | POA: Insufficient documentation

## 2015-06-14 DIAGNOSIS — L89204 Pressure ulcer of unspecified hip, stage 4: Secondary | ICD-10-CM

## 2015-06-14 HISTORY — DX: Pressure ulcer of unspecified hip, stage 4: L89.204

## 2015-06-17 ENCOUNTER — Encounter (HOSPITAL_BASED_OUTPATIENT_CLINIC_OR_DEPARTMENT_OTHER): Payer: Medicare Other | Attending: Internal Medicine

## 2015-06-17 DIAGNOSIS — E119 Type 2 diabetes mellitus without complications: Secondary | ICD-10-CM | POA: Insufficient documentation

## 2015-06-17 DIAGNOSIS — L89214 Pressure ulcer of right hip, stage 4: Secondary | ICD-10-CM | POA: Insufficient documentation

## 2015-06-17 DIAGNOSIS — L89324 Pressure ulcer of left buttock, stage 4: Secondary | ICD-10-CM | POA: Insufficient documentation

## 2015-06-17 DIAGNOSIS — G35 Multiple sclerosis: Secondary | ICD-10-CM | POA: Insufficient documentation

## 2015-06-17 DIAGNOSIS — L89313 Pressure ulcer of right buttock, stage 3: Secondary | ICD-10-CM | POA: Insufficient documentation

## 2015-06-20 DIAGNOSIS — L89313 Pressure ulcer of right buttock, stage 3: Secondary | ICD-10-CM | POA: Diagnosis not present

## 2015-06-20 DIAGNOSIS — L89214 Pressure ulcer of right hip, stage 4: Secondary | ICD-10-CM | POA: Diagnosis not present

## 2015-06-20 DIAGNOSIS — L89324 Pressure ulcer of left buttock, stage 4: Secondary | ICD-10-CM | POA: Diagnosis not present

## 2015-06-20 DIAGNOSIS — E119 Type 2 diabetes mellitus without complications: Secondary | ICD-10-CM | POA: Diagnosis not present

## 2015-06-20 DIAGNOSIS — L89212 Pressure ulcer of right hip, stage 2: Secondary | ICD-10-CM | POA: Diagnosis not present

## 2015-06-20 DIAGNOSIS — L89323 Pressure ulcer of left buttock, stage 3: Secondary | ICD-10-CM | POA: Diagnosis not present

## 2015-06-20 DIAGNOSIS — G35 Multiple sclerosis: Secondary | ICD-10-CM | POA: Diagnosis not present

## 2015-07-09 DIAGNOSIS — L89323 Pressure ulcer of left buttock, stage 3: Secondary | ICD-10-CM | POA: Diagnosis not present

## 2015-07-09 DIAGNOSIS — Z7982 Long term (current) use of aspirin: Secondary | ICD-10-CM | POA: Diagnosis not present

## 2015-07-09 DIAGNOSIS — D649 Anemia, unspecified: Secondary | ICD-10-CM | POA: Diagnosis not present

## 2015-07-09 DIAGNOSIS — T86822 Skin graft (allograft) (autograft) infection: Secondary | ICD-10-CM | POA: Diagnosis not present

## 2015-07-09 DIAGNOSIS — Z993 Dependence on wheelchair: Secondary | ICD-10-CM | POA: Diagnosis not present

## 2015-07-09 DIAGNOSIS — D62 Acute posthemorrhagic anemia: Secondary | ICD-10-CM | POA: Diagnosis not present

## 2015-07-09 DIAGNOSIS — G35 Multiple sclerosis: Secondary | ICD-10-CM | POA: Diagnosis not present

## 2015-07-09 DIAGNOSIS — L89214 Pressure ulcer of right hip, stage 4: Secondary | ICD-10-CM | POA: Diagnosis not present

## 2015-07-09 DIAGNOSIS — N183 Chronic kidney disease, stage 3 (moderate): Secondary | ICD-10-CM | POA: Diagnosis not present

## 2015-07-09 DIAGNOSIS — Z87442 Personal history of urinary calculi: Secondary | ICD-10-CM | POA: Diagnosis not present

## 2015-07-09 DIAGNOSIS — L03115 Cellulitis of right lower limb: Secondary | ICD-10-CM | POA: Diagnosis not present

## 2015-07-09 DIAGNOSIS — E1122 Type 2 diabetes mellitus with diabetic chronic kidney disease: Secondary | ICD-10-CM | POA: Diagnosis not present

## 2015-07-09 DIAGNOSIS — E785 Hyperlipidemia, unspecified: Secondary | ICD-10-CM | POA: Diagnosis not present

## 2015-07-09 DIAGNOSIS — Z794 Long term (current) use of insulin: Secondary | ICD-10-CM | POA: Diagnosis not present

## 2015-07-09 DIAGNOSIS — I129 Hypertensive chronic kidney disease with stage 1 through stage 4 chronic kidney disease, or unspecified chronic kidney disease: Secondary | ICD-10-CM | POA: Diagnosis not present

## 2015-07-09 DIAGNOSIS — Z01818 Encounter for other preprocedural examination: Secondary | ICD-10-CM | POA: Diagnosis not present

## 2015-07-09 DIAGNOSIS — G8220 Paraplegia, unspecified: Secondary | ICD-10-CM | POA: Diagnosis not present

## 2015-07-09 DIAGNOSIS — R1310 Dysphagia, unspecified: Secondary | ICD-10-CM | POA: Diagnosis not present

## 2015-07-09 LAB — BASIC METABOLIC PANEL
CREATININE: 1.5 mg/dL — AB (ref 0.6–1.3)
Glucose: 136 mg/dL
POTASSIUM: 4.6 mmol/L (ref 3.4–5.3)
Sodium: 138 mmol/L (ref 137–147)

## 2015-07-09 LAB — CBC AND DIFFERENTIAL
HCT: 32 % — AB (ref 41–53)
Hemoglobin: 10.5 g/dL — AB (ref 13.5–17.5)
Platelets: 276 10*3/uL (ref 150–399)
WBC: 10.6 10^3/mL

## 2015-07-09 LAB — HEPATIC FUNCTION PANEL
ALT: 15 U/L (ref 10–40)
AST: 14 U/L (ref 14–40)
Alkaline Phosphatase: 94 U/L (ref 25–125)
Bilirubin, Total: 0.4 mg/dL

## 2015-07-15 DIAGNOSIS — L89204 Pressure ulcer of unspecified hip, stage 4: Secondary | ICD-10-CM | POA: Diagnosis not present

## 2015-07-15 DIAGNOSIS — L89324 Pressure ulcer of left buttock, stage 4: Secondary | ICD-10-CM | POA: Diagnosis not present

## 2015-07-15 DIAGNOSIS — L89213 Pressure ulcer of right hip, stage 3: Secondary | ICD-10-CM | POA: Diagnosis not present

## 2015-07-15 DIAGNOSIS — L89323 Pressure ulcer of left buttock, stage 3: Secondary | ICD-10-CM | POA: Diagnosis not present

## 2015-07-17 DIAGNOSIS — Z48817 Encounter for surgical aftercare following surgery on the skin and subcutaneous tissue: Secondary | ICD-10-CM | POA: Diagnosis not present

## 2015-07-17 DIAGNOSIS — G8220 Paraplegia, unspecified: Secondary | ICD-10-CM | POA: Diagnosis not present

## 2015-07-17 DIAGNOSIS — I129 Hypertensive chronic kidney disease with stage 1 through stage 4 chronic kidney disease, or unspecified chronic kidney disease: Secondary | ICD-10-CM | POA: Diagnosis present

## 2015-07-17 DIAGNOSIS — D62 Acute posthemorrhagic anemia: Secondary | ICD-10-CM | POA: Diagnosis not present

## 2015-07-17 DIAGNOSIS — Z794 Long term (current) use of insulin: Secondary | ICD-10-CM | POA: Diagnosis not present

## 2015-07-17 DIAGNOSIS — L89323 Pressure ulcer of left buttock, stage 3: Secondary | ICD-10-CM | POA: Diagnosis not present

## 2015-07-17 DIAGNOSIS — T86822 Skin graft (allograft) (autograft) infection: Secondary | ICD-10-CM | POA: Diagnosis not present

## 2015-07-17 DIAGNOSIS — Z7982 Long term (current) use of aspirin: Secondary | ICD-10-CM | POA: Diagnosis not present

## 2015-07-17 DIAGNOSIS — L89204 Pressure ulcer of unspecified hip, stage 4: Secondary | ICD-10-CM | POA: Diagnosis not present

## 2015-07-17 DIAGNOSIS — E1122 Type 2 diabetes mellitus with diabetic chronic kidney disease: Secondary | ICD-10-CM | POA: Diagnosis present

## 2015-07-17 DIAGNOSIS — E785 Hyperlipidemia, unspecified: Secondary | ICD-10-CM | POA: Diagnosis present

## 2015-07-17 DIAGNOSIS — M6281 Muscle weakness (generalized): Secondary | ICD-10-CM | POA: Diagnosis not present

## 2015-07-17 DIAGNOSIS — L89213 Pressure ulcer of right hip, stage 3: Secondary | ICD-10-CM | POA: Diagnosis not present

## 2015-07-17 DIAGNOSIS — Z87442 Personal history of urinary calculi: Secondary | ICD-10-CM | POA: Diagnosis not present

## 2015-07-17 DIAGNOSIS — G35 Multiple sclerosis: Secondary | ICD-10-CM | POA: Diagnosis present

## 2015-07-17 DIAGNOSIS — N183 Chronic kidney disease, stage 3 (moderate): Secondary | ICD-10-CM | POA: Diagnosis present

## 2015-07-17 DIAGNOSIS — L03115 Cellulitis of right lower limb: Secondary | ICD-10-CM | POA: Diagnosis not present

## 2015-07-17 DIAGNOSIS — D649 Anemia, unspecified: Secondary | ICD-10-CM | POA: Diagnosis present

## 2015-07-17 DIAGNOSIS — E119 Type 2 diabetes mellitus without complications: Secondary | ICD-10-CM | POA: Diagnosis not present

## 2015-07-17 DIAGNOSIS — L89324 Pressure ulcer of left buttock, stage 4: Secondary | ICD-10-CM | POA: Diagnosis not present

## 2015-07-17 DIAGNOSIS — L89214 Pressure ulcer of right hip, stage 4: Secondary | ICD-10-CM | POA: Diagnosis present

## 2015-07-17 DIAGNOSIS — R1310 Dysphagia, unspecified: Secondary | ICD-10-CM | POA: Diagnosis present

## 2015-07-17 DIAGNOSIS — Z993 Dependence on wheelchair: Secondary | ICD-10-CM | POA: Diagnosis not present

## 2015-07-17 DIAGNOSIS — Z7409 Other reduced mobility: Secondary | ICD-10-CM | POA: Diagnosis not present

## 2015-07-17 LAB — CBC AND DIFFERENTIAL
HEMATOCRIT: 30 % — AB (ref 41–53)
Hemoglobin: 9.6 g/dL — AB (ref 13.5–17.5)
PLATELETS: 238 10*3/uL (ref 150–399)
WBC: 6.3 10*3/mL

## 2015-08-02 DIAGNOSIS — I129 Hypertensive chronic kidney disease with stage 1 through stage 4 chronic kidney disease, or unspecified chronic kidney disease: Secondary | ICD-10-CM | POA: Diagnosis not present

## 2015-08-02 DIAGNOSIS — M25559 Pain in unspecified hip: Secondary | ICD-10-CM | POA: Diagnosis not present

## 2015-08-02 DIAGNOSIS — S31809A Unspecified open wound of unspecified buttock, initial encounter: Secondary | ICD-10-CM | POA: Diagnosis not present

## 2015-08-02 DIAGNOSIS — M6281 Muscle weakness (generalized): Secondary | ICD-10-CM | POA: Diagnosis not present

## 2015-08-02 DIAGNOSIS — Z48817 Encounter for surgical aftercare following surgery on the skin and subcutaneous tissue: Secondary | ICD-10-CM | POA: Diagnosis not present

## 2015-08-02 DIAGNOSIS — E119 Type 2 diabetes mellitus without complications: Secondary | ICD-10-CM | POA: Diagnosis not present

## 2015-08-02 DIAGNOSIS — L89323 Pressure ulcer of left buttock, stage 3: Secondary | ICD-10-CM | POA: Diagnosis not present

## 2015-08-02 DIAGNOSIS — E785 Hyperlipidemia, unspecified: Secondary | ICD-10-CM | POA: Diagnosis not present

## 2015-08-02 DIAGNOSIS — G8220 Paraplegia, unspecified: Secondary | ICD-10-CM | POA: Diagnosis not present

## 2015-08-02 DIAGNOSIS — G35 Multiple sclerosis: Secondary | ICD-10-CM | POA: Diagnosis not present

## 2015-08-02 DIAGNOSIS — I10 Essential (primary) hypertension: Secondary | ICD-10-CM | POA: Diagnosis not present

## 2015-08-02 DIAGNOSIS — D649 Anemia, unspecified: Secondary | ICD-10-CM | POA: Diagnosis not present

## 2015-08-02 DIAGNOSIS — K5909 Other constipation: Secondary | ICD-10-CM | POA: Diagnosis not present

## 2015-08-02 DIAGNOSIS — Z7409 Other reduced mobility: Secondary | ICD-10-CM | POA: Diagnosis not present

## 2015-08-02 DIAGNOSIS — L989 Disorder of the skin and subcutaneous tissue, unspecified: Secondary | ICD-10-CM | POA: Diagnosis not present

## 2015-08-02 DIAGNOSIS — N390 Urinary tract infection, site not specified: Secondary | ICD-10-CM | POA: Diagnosis not present

## 2015-08-02 DIAGNOSIS — E11622 Type 2 diabetes mellitus with other skin ulcer: Secondary | ICD-10-CM | POA: Diagnosis not present

## 2015-08-02 DIAGNOSIS — E1122 Type 2 diabetes mellitus with diabetic chronic kidney disease: Secondary | ICD-10-CM | POA: Diagnosis not present

## 2015-08-02 DIAGNOSIS — N183 Chronic kidney disease, stage 3 (moderate): Secondary | ICD-10-CM | POA: Diagnosis not present

## 2015-08-02 DIAGNOSIS — L89313 Pressure ulcer of right buttock, stage 3: Secondary | ICD-10-CM | POA: Diagnosis not present

## 2015-08-02 DIAGNOSIS — E1169 Type 2 diabetes mellitus with other specified complication: Secondary | ICD-10-CM | POA: Diagnosis not present

## 2015-08-02 DIAGNOSIS — L89214 Pressure ulcer of right hip, stage 4: Secondary | ICD-10-CM | POA: Diagnosis not present

## 2015-08-02 DIAGNOSIS — Z794 Long term (current) use of insulin: Secondary | ICD-10-CM | POA: Diagnosis not present

## 2015-08-02 DIAGNOSIS — L89153 Pressure ulcer of sacral region, stage 3: Secondary | ICD-10-CM | POA: Diagnosis not present

## 2015-08-02 DIAGNOSIS — G8250 Quadriplegia, unspecified: Secondary | ICD-10-CM | POA: Diagnosis not present

## 2015-08-02 LAB — BASIC METABOLIC PANEL
BUN: 30 mg/dL — AB (ref 4–21)
Creatinine: 1.5 mg/dL — AB (ref 0.6–1.3)
Glucose: 146 mg/dL
Potassium: 4.2 mmol/L (ref 3.4–5.3)
Sodium: 138 mmol/L (ref 137–147)

## 2015-08-02 LAB — CBC AND DIFFERENTIAL
HCT: 27 % — AB (ref 41–53)
Hemoglobin: 8.8 g/dL — AB (ref 13.5–17.5)
Platelets: 340 10*3/uL (ref 150–399)
WBC: 8.3 10^3/mL

## 2015-08-02 LAB — HEMOGLOBIN A1C: Hemoglobin A1C: 6.1

## 2015-08-05 ENCOUNTER — Non-Acute Institutional Stay (SKILLED_NURSING_FACILITY): Payer: Medicare Other | Admitting: Internal Medicine

## 2015-08-05 ENCOUNTER — Encounter: Payer: Self-pay | Admitting: Internal Medicine

## 2015-08-05 DIAGNOSIS — Z794 Long term (current) use of insulin: Secondary | ICD-10-CM | POA: Diagnosis not present

## 2015-08-05 DIAGNOSIS — K5909 Other constipation: Secondary | ICD-10-CM

## 2015-08-05 DIAGNOSIS — G8250 Quadriplegia, unspecified: Secondary | ICD-10-CM

## 2015-08-05 DIAGNOSIS — I10 Essential (primary) hypertension: Secondary | ICD-10-CM

## 2015-08-05 DIAGNOSIS — L89323 Pressure ulcer of left buttock, stage 3: Secondary | ICD-10-CM

## 2015-08-05 DIAGNOSIS — L89153 Pressure ulcer of sacral region, stage 3: Secondary | ICD-10-CM | POA: Diagnosis not present

## 2015-08-05 DIAGNOSIS — E11622 Type 2 diabetes mellitus with other skin ulcer: Secondary | ICD-10-CM | POA: Diagnosis not present

## 2015-08-05 DIAGNOSIS — E785 Hyperlipidemia, unspecified: Secondary | ICD-10-CM | POA: Diagnosis not present

## 2015-08-05 DIAGNOSIS — N183 Chronic kidney disease, stage 3 unspecified: Secondary | ICD-10-CM

## 2015-08-05 DIAGNOSIS — G35 Multiple sclerosis: Secondary | ICD-10-CM | POA: Diagnosis not present

## 2015-08-05 NOTE — Progress Notes (Signed)
Patient ID: Lance [name], male   [name]: 1941-03-23, 75 y.o.   MRN: [id number]    HISTORY AND PHYSICAL   DATE: 08/05/15  Location:  [facility] Room Number: [id number] A Place of Service: SNF (31)   Extended Emergency Contact Information Primary Emergency Contact: Lamar,Judy Address: [address], [place] [address] [place] Phone: [phone] Mobile Phone: [phone] Relation: Spouse  Advanced Directive information Does patient have an advance directive?: No, Would patient like information on creating an advanced directive?: No - patient declined information  FULL CODE  Chief Complaint  Patient presents with  . New Admit To SNF    HPI:  75 yo male deen today as a new admission into SNF following hospital stay at [facility] for left ischial stage 3 ulcer and greater trochanter ulcer stage 3, paraplegic due to MS, CKD, DM and hyperlipidemia. He underwent debridement of ulcers and had skin flap on right thigh on 3/13th. Prior to hospital admission, he failed conservative tx. Cr 1.33 at d/c. Albumin 3.6. Insulin dose adjusted but then regular dose resumed at d/c.   He is c/a BS and noted usually 140-200s. He likes to eat cookies at night. He feels they were better controlled when he was on 18 units levemir and 8 units novolog qAC. No low BS reactions. He has chronic weakness in legs but states he was able to stand and pivot to transfer prior to hospital stay. He has a f/u with surgery later this week. JP drain is emptied often. No f/c. Feels constipated and no BM x several days  MS/paraplegic - he has been paraplegix >10 yrs and was dx with MS > 30 yrs ago. Not currently on tx. Followed by neurology. Pain stable on percocet  Urinary retention due to neurogenic bladder - has foley cath. Takes ditropan for spasms and trimethoprim for UTI prevention  Hyperlipidemia - stable on zocor and fish oil  He  takes vitamins and ASA daily   Past Medical History  Diagnosis Date  . Hypertension   . Blood transfusion   . Neuralgia neuritis, sciatic nerve     MS for 30 years  . PONV (postoperative nausea and vomiting)   . MS (multiple sclerosis) ([place])   . Blood transfusion without reported diagnosis   . CKD (chronic kidney disease), stage III   . Anemia in chronic renal disease   . Chronic spastic paraplegia (HCC)     secondary to MS  . Self-catheterizes urinary bladder   . Neurogenic bladder   . History of recurrent UTIs   . History of MRSA infection     urine  . Decubitus ulcer of ischium   . Decreased sensation of lower extremity     due to MS  . Type 2 diabetes mellitus [facility])     Past Surgical History  Procedure Laterality Date  . Tonsillectomy    . Finger surgery  2004  . Wound debridement    . Extracorporeal shock wave lithotripsy  03-23-2011  . Incision and drainage of wound Left 09/05/2014    Procedure: IRRIGATION AND DEBRIDEMENT OF LEFT ISCHIUM WOUND WITH ;  Surgeon: Theodoro Kos, DO;  Location: [place];  Service: Plastics;  Laterality: Left;  . Application of a-cell of extremity Left 09/05/2014    Procedure: PLACEMENT OF ACELL AND VAC;  Surgeon: Theodoro Kos, DO;  Location: [place];  Service:  Plastics;  Laterality: Left;    Patient Care Team: Gildardo Cranker, DO as PCP - General (Internal Medicine)  Social History   Social History  . Marital Status: Married    Spouse Name: N/A  . Number of Children: N/A  . Years of Education: N/A   Occupational History  . Not on file.   Social History Main Topics  . Smoking status: Former Smoker    Types: Cigars    Quit date: 05/06/1983  . Smokeless tobacco: Never Used  . Alcohol Use: 3.6 oz/week    6 Glasses of wine per week  . Drug Use: No  . Sexual Activity: Not Currently    Birth Control/ Protection: None   Other Topics Concern  . Not on file   Social History Narrative     reports that he quit smoking about 32 years  ago. His smoking use included Cigars. He has never used smokeless tobacco. He reports that he drinks about 3.6 oz of alcohol per week. He reports that he does not use illicit drugs.  Family History  Problem Relation Age of Onset  . Stroke Father   . Diabetes Father    Family Status  Relation Status Death Age  . Mother Deceased   . Father Deceased     There is no immunization history for the selected administration types on file for this patient.  No Known Allergies  Medications: Patient's Medications  New Prescriptions   No medications on file  Previous Medications   ASCORBIC ACID (VITAMIN C PO)    Take by mouth.   ASPIRIN 81 MG TABLET    Take 1 tablet (81 mg total) by mouth daily.   BISACODYL (DULCOLAX) 10 MG SUPPOSITORY    Place 1 suppository (10 mg total) rectally daily as needed for moderate constipation.   CHOLECALCIFEROL (VITAMIN D3) 2000 UNITS TABS    Take 2,000 mg by mouth daily.     FISH OIL-OMEGA-3 FATTY ACIDS 1000 MG CAPSULE    Take 1 capsule (1 g total) by mouth daily.   INSULIN ASPART (NOVOLOG FLEXPEN) 100 UNIT/ML FLEXPEN    Inject 8 Units into the skin 3 (three) times daily with meals.   INSULIN DETEMIR (LEVEMIR) 100 UNIT/ML PEN    Inject 22 Units into the skin every morning.   MULTIPLE VITAMIN (MULTIVITAMIN WITH MINERALS) TABS TABLET    Take 1 tablet by mouth daily.   OXYBUTYNIN (DITROPAN) 5 MG TABLET    Take 5 mg by mouth 2 (two) times daily.     OXYCODONE-ACETAMINOPHEN (PERCOCET/ROXICET) 5-325 MG TABLET    Take 1 tablet by mouth every 6 (six) hours as needed for severe pain.   SIMVASTATIN (ZOCOR) 20 MG TABLET    Take 20 mg by mouth every evening.   TRIMETHOPRIM (TRIMPEX) 100 MG TABLET    Take 100 mg by mouth daily.  Modified Medications   No medications on file  Discontinued Medications   FERROUS SULFATE 325 (65 FE) MG TABLET    Take 1 tablet (325 mg total) by mouth daily with breakfast.   LINIMENTS (SALONPAS PAIN RELIEF PATCH EX)    Apply 1 patch topically  daily as needed (pain).   VANCOMYCIN (VANCOCIN) 1 GM/200ML SOLN    Inject 200 mLs (1,000 mg total) into the vein daily.    Review of Systems  Constitutional: Positive for fatigue. Negative for chills and activity change.  HENT: Negative for sore throat and trouble swallowing.   Eyes: Negative for visual disturbance.  Respiratory:  Negative for cough, chest tightness and shortness of breath.   Cardiovascular: Negative for chest pain, palpitations and leg swelling.  Gastrointestinal: Negative for nausea, vomiting, abdominal pain and blood in stool.  Genitourinary: Negative for urgency, frequency and difficulty urinating.  Musculoskeletal: Negative for arthralgias and gait problem.  Skin: Positive for wound. Negative for rash.  Neurological: Positive for weakness. Negative for headaches.  Psychiatric/Behavioral: Negative for confusion and sleep disturbance. The patient is not nervous/anxious.     Filed Vitals:   08/05/15 1145  BP: 130/60  Pulse: 80  Temp: 98.4 F (36.9 C)  TempSrc: Oral  Resp: 16   There is no weight on file to calculate BMI.  Physical Exam  Constitutional: He is oriented to person, place, and time. He appears well-developed.  Frail appearing lying in bed in NAD  HENT:  Mouth/Throat: Oropharynx is clear and moist.  Eyes: Pupils are equal, round, and reactive to light. No scleral icterus.  Neck: Neck supple. Carotid bruit is not present. No thyromegaly present.  Cardiovascular: Normal rate, regular rhythm, normal heart sounds and intact distal pulses.  Exam reveals no gallop and no friction rub.   No murmur heard. no distal LE swelling. No calf TTP  Pulmonary/Chest: Effort normal and breath sounds normal. He has no wheezes. He has no rales. He exhibits no tenderness.  Abdominal: Soft. Bowel sounds are normal. He exhibits no distension, no abdominal bruit, no pulsatile midline mass and no mass. There is tenderness. There is no rebound and no guarding.    Genitourinary:  Foley DTG clear yellow urine; min sediment in cath  Musculoskeletal: He exhibits edema.  Right JP drain with large amt drainage but no purulent d/c or bright red blood. Lateral right thigh incision healing well. sutures intact. No secondary signs of infection or wound dehiscience  Lymphadenopathy:    He has no cervical adenopathy.  Neurological: He is alert and oriented to person, place, and time.  LE>UE paralysis with spastic extremities and muscle atrophy noted  Skin: Skin is warm and dry. No rash noted.     Unna boots intact b/l; ischial wound per d/c summary  Psychiatric: He has a normal mood and affect. His behavior is normal. Judgment and thought content normal.     Labs reviewed: Nursing Home on 08/05/2015  Component Date Value Ref Range Status  . Hemoglobin 07/09/2015 10.5* 13.5 - 17.5 g/dL Final  . HCT 07/09/2015 32* 41 - 53 % Final  . Platelets 07/09/2015 276  150 - 399 K/L Final  . WBC 07/09/2015 10.6   Final  . Glucose 07/09/2015 136   Final  . Creatinine 07/09/2015 1.5* 0.6 - 1.3 mg/dL Final  . Potassium 07/09/2015 4.6  3.4 - 5.3 mmol/L Final  . Sodium 07/09/2015 138  137 - 147 mmol/L Final  . Alkaline Phosphatase 07/09/2015 94  25 - 125 U/L Final  . ALT 07/09/2015 15  10 - 40 U/L Final  . AST 07/09/2015 14  14 - 40 U/L Final  . Bilirubin, Total 07/09/2015 0.4   Final    No results found.   Assessment/Plan   ICD-9-CM ICD-10-CM   1. Type 2 diabetes mellitus with other skin ulcer, with long-term current use of insulin (HCC)  E11.622     Z79.4   2. Left perineal ischial pressure ulcer, stage III (HCC) 707.05 L89.323    707.23    3. Sacral decubitus ulcer, stage III (HCC) 707.03 L89.153    707.23    4. Essential hypertension,  benign 401.1 I10   5. Multiple sclerosis ([place]) 340 G35   6. Spastic quadriplegia (HCC) 344.00 G82.50   7. Chronic kidney disease, stage 3 585.3 N18.3   8. Hyperlipidemia LDL goal <100 272.4 E78.5   9. Other  constipation 564.09 K59.09     Check CBC w diff, BMP, A1c   Reduce levemir 18 units qhs. Cont novolog 8 units qAC  Start miralax 17gm po daily for constipation  Start colace 100mg  po daily for constipation  Cont other meds as ordered  PT/OT/ST as ordered. Prior to hospitalization, he was able to stand and pivot to transfer  Wound care as ordered  F/u with surgery as scheduled later this week  Keep appt with specialists as scheduled  GOAL: short term rehab and d/c home when medically appropriate. Communicated with pt and nursing.  Will follow  Aldean Pipe S. Perlie Gold  [facility] [address] [place], [place] [address] [phone] Cell (Monday-Friday 8 AM - 5 PM) [phone] After 5 PM and follow prompts

## 2015-08-09 DIAGNOSIS — Z48817 Encounter for surgical aftercare following surgery on the skin and subcutaneous tissue: Secondary | ICD-10-CM | POA: Diagnosis not present

## 2015-08-12 LAB — BASIC METABOLIC PANEL
BUN: 31 mg/dL — AB (ref 4–21)
Creatinine: 1.4 mg/dL — AB (ref 0.6–1.3)
Glucose: 110 mg/dL
POTASSIUM: 4.1 mmol/L (ref 3.4–5.3)
SODIUM: 140 mmol/L (ref 137–147)

## 2015-08-12 LAB — CBC AND DIFFERENTIAL
HEMATOCRIT: 28 % — AB (ref 41–53)
HEMOGLOBIN: 8.8 g/dL — AB (ref 13.5–17.5)
Platelets: 261 10*3/uL (ref 150–399)
WBC: 6.7 10^3/mL

## 2015-08-30 LAB — CBC AND DIFFERENTIAL
HCT: 30 % — AB (ref 41–53)
Hemoglobin: 9.5 g/dL — AB (ref 13.5–17.5)
PLATELETS: 231 10*3/uL (ref 150–399)
WBC: 6.5 10^3/mL

## 2015-09-06 ENCOUNTER — Encounter: Payer: Self-pay | Admitting: Adult Health

## 2015-09-06 ENCOUNTER — Non-Acute Institutional Stay (SKILLED_NURSING_FACILITY): Payer: Medicare Other | Admitting: Adult Health

## 2015-09-06 DIAGNOSIS — E1169 Type 2 diabetes mellitus with other specified complication: Secondary | ICD-10-CM

## 2015-09-06 DIAGNOSIS — N390 Urinary tract infection, site not specified: Secondary | ICD-10-CM | POA: Diagnosis not present

## 2015-09-06 DIAGNOSIS — L89323 Pressure ulcer of left buttock, stage 3: Secondary | ICD-10-CM

## 2015-09-06 DIAGNOSIS — Z794 Long term (current) use of insulin: Secondary | ICD-10-CM | POA: Diagnosis not present

## 2015-09-06 DIAGNOSIS — E785 Hyperlipidemia, unspecified: Secondary | ICD-10-CM

## 2015-09-06 DIAGNOSIS — N183 Chronic kidney disease, stage 3 unspecified: Secondary | ICD-10-CM

## 2015-09-06 DIAGNOSIS — G35D Multiple sclerosis, unspecified: Secondary | ICD-10-CM

## 2015-09-06 DIAGNOSIS — E1122 Type 2 diabetes mellitus with diabetic chronic kidney disease: Secondary | ICD-10-CM

## 2015-09-06 DIAGNOSIS — G35 Multiple sclerosis: Secondary | ICD-10-CM

## 2015-09-06 DIAGNOSIS — E1129 Type 2 diabetes mellitus with other diabetic kidney complication: Secondary | ICD-10-CM | POA: Insufficient documentation

## 2015-09-06 NOTE — Progress Notes (Signed)
Patient ID: Lance [name], male   [name]: Jan 29, 1941, 75 y.o.   MRN: [id number]   Facility: [facility]     CODE STATUS: Full Code  No Known Allergies  Chief Complaint  Patient presents with  . Medical Management of Chronic Issues    Follow up    HPI:  He is a resident of this facility being seen for the management of his chronic illnesses. His flaps are nearly resolved. He is due to return to the wound clinic in 1 1/2 weeks. We discussed his insulin regimen; his dietary needs for his diabetes and his wound management. We discussed at length his need for therapy. He is motivated to return back to his previous level of functioning. He is not voicing any complaints or concerns at this time. He will start therapy on Monday.   Past Medical History  Diagnosis Date  . Hypertension   . Blood transfusion   . Neuralgia neuritis, sciatic nerve     MS for 30 years  . PONV (postoperative nausea and vomiting)   . MS (multiple sclerosis) ([place])   . Blood transfusion without reported diagnosis   . CKD (chronic kidney disease), stage III   . Anemia in chronic renal disease   . Chronic spastic paraplegia (HCC)     secondary to MS  . Self-catheterizes urinary bladder   . Neurogenic bladder   . History of recurrent UTIs   . History of MRSA infection     urine  . Decubitus ulcer of ischium   . Decreased sensation of lower extremity     due to MS  . Type 2 diabetes mellitus [facility])     Past Surgical History  Procedure Laterality Date  . Tonsillectomy    . Finger surgery  2004  . Wound debridement    . Extracorporeal shock wave lithotripsy  03-23-2011  . Incision and drainage of wound Left 09/05/2014    Procedure: IRRIGATION AND DEBRIDEMENT OF LEFT ISCHIUM WOUND WITH ;  Surgeon: Theodoro Kos, DO;  Location: [place];  Service: Plastics;  Laterality: Left;  . Application of a-cell of extremity Left 09/05/2014    Procedure: PLACEMENT OF ACELL AND VAC;  Surgeon: Theodoro Kos, DO;  Location: [place];  Service: Plastics;  Laterality: Left;    Social History   Social History  . Marital Status: Married    Spouse Name: N/A  . Number of Children: N/A  . Years of Education: N/A   Occupational History  . Not on file.   Social History Main Topics  . Smoking status: Former Smoker    Types: Cigars    Quit date: 05/06/1983  . Smokeless tobacco: Never Used  . Alcohol Use: 3.6 oz/week    6 Glasses of wine per week  . Drug Use: No  . Sexual Activity: Not Currently    Birth Control/ Protection: None   Other Topics Concern  . Not on file   Social History Narrative   Family History  Problem Relation Age of Onset  . Stroke Father   . Diabetes Father       VITAL SIGNS BP 114/69 mmHg  Pulse 75  Temp(Src) 98 F (36.7 C) (Oral)  Resp 18  Ht 6\' 4"  (1.93 m)  Wt 178 lb (80.74 kg)  BMI 21.68 kg/m2  Patient's Medications  New Prescriptions   No medications on file  Previous Medications   ASCORBIC ACID (VITAMIN C PO)    Take by mouth.   ASPIRIN 81 MG  TABLET    Take 1 tablet (81 mg total) by mouth daily.   BISACODYL (DULCOLAX) 10 MG SUPPOSITORY    Place 1 suppository (10 mg total) rectally daily as needed for moderate constipation.   CHOLECALCIFEROL (VITAMIN D3) 2000 UNITS TABS    Take 2,000 mg by mouth daily.     FISH OIL-OMEGA-3 FATTY ACIDS 1000 MG CAPSULE    Take 1 capsule (1 g total) by mouth daily.   INSULIN ASPART (NOVOLOG FLEXPEN) 100 UNIT/ML FLEXPEN    Inject 8 Units into the skin 3 (three) times daily with meals.   INSULIN DETEMIR (LEVEMIR) 100 UNIT/ML PEN    Inject 22 Units into the skin every morning.   MULTIPLE VITAMIN (MULTIVITAMIN WITH MINERALS) TABS TABLET    Take 1 tablet by mouth daily.   OXYBUTYNIN (DITROPAN) 5 MG TABLET    Take 5 mg by mouth 3 (three) times daily.    OXYCODONE-ACETAMINOPHEN (PERCOCET/ROXICET) 5-325 MG TABLET    Take 1 tablet by mouth every 6 (six) hours as needed for severe pain.   POLYETHYLENE GLYCOL (MIRALAX / GLYCOLAX) PACKET    Take 17  g by mouth daily.   SIMVASTATIN (ZOCOR) 20 MG TABLET    Take 20 mg by mouth every evening.   TRIMETHOPRIM (TRIMPEX) 100 MG TABLET    Take 100 mg by mouth daily.  Modified Medications   No medications on file  Discontinued Medications   No medications on file     SIGNIFICANT DIAGNOSTIC EXAMS  LABS REVIEWED:   08-09-15; wbc 8.3; hgb 8.8; hct 26.7; mcv 92.1; plt 340; glucose 146; bun 30; creat 1.51; k+ 4.2; na++138; hgb a1c 6.1  08-12-15: wbc 6.7; hgb 8.8; hct 27.5; mcv 93.5; plt 261; glucose 110; bun 31; creat 1.36; k+ 4.1; na++140    Review of Systems  Constitutional: Negative for malaise/fatigue.  Respiratory: Negative for cough and shortness of breath.   Cardiovascular: Negative for chest pain, palpitations and leg swelling.  Gastrointestinal: Negative for heartburn, abdominal pain and constipation.  Genitourinary:       Has foley due to wounds   Musculoskeletal: Negative for myalgias, back pain and joint pain.       Legs are weak; with little movement present   Skin:       Skin flaps are resolving   Neurological: Negative for dizziness.  Psychiatric/Behavioral: The patient is not nervous/anxious.     Physical Exam  Constitutional: He is oriented to person, place, and time. No distress.  Thin   Eyes: Conjunctivae are normal.  Neck: Neck supple. No JVD present. No thyromegaly present.  Cardiovascular: Normal rate, regular rhythm and intact distal pulses.   Respiratory: Effort normal and breath sounds normal. No respiratory distress. He has no wheezes.  GI: Soft. Bowel sounds are normal. He exhibits no distension. There is no tenderness.  Genitourinary:  Has foley   Musculoskeletal: He exhibits no edema.  Able to move upper extremities  Unable to move lower extremities Does have atrophy present   Lymphadenopathy:    He has no cervical adenopathy.  Neurological: He is alert and oriented to person, place, and time.  Skin: Skin is warm and dry. He is not diaphoretic.  Skin  flaps without signs of infection present.   Psychiatric: He has a normal mood and affect.      ASSESSMENT/ PLAN:  1. Diabetes type II: hgb a1c is 6.1; will continue novolog 8 units with meals for cbg >=150; levemir 22 units nighlty   2.  Dyslipidemia: will continue fish oil 1 gm daily zocor 20 mg daily   3. Hypertension: is presently not on medications; will monitor  Will continue asa 81 mg daily   4. CKD stage III: bun/creat are 31/1.38; will monitor  5. Bladder spasms: has foley due to wounds; will continue ditropan 5 mg three times daily   6. Recurrent UTI: no recent infection present; will continue trimpex 100   7. MS: has spastic quadriplegia: has percocet 5/325 mg every 6 hours as needed for pain; will begin ot/ot to evaluate as indicated to improve upon his level of independence  8. Anemia: hgb is stable at 8.8; will monitor   9. Left perineal pressure ulcer: will continue treatment as ordered; will monitor is status post flap.   Time spent with patient 45   minutes >50% time spent counseling; reviewing medical record; tests; labs; and developing future plan of care   Ok Edwards NP [facility]  Contact [phone] Monday through Friday 8am- 5pm  After hours call [phone]

## 2015-10-18 ENCOUNTER — Non-Acute Institutional Stay (SKILLED_NURSING_FACILITY): Payer: Medicare Other | Admitting: Nurse Practitioner

## 2015-10-18 DIAGNOSIS — Z794 Long term (current) use of insulin: Secondary | ICD-10-CM | POA: Diagnosis not present

## 2015-10-18 DIAGNOSIS — K5909 Other constipation: Secondary | ICD-10-CM | POA: Diagnosis not present

## 2015-10-18 DIAGNOSIS — E1122 Type 2 diabetes mellitus with diabetic chronic kidney disease: Secondary | ICD-10-CM | POA: Diagnosis not present

## 2015-10-18 DIAGNOSIS — N390 Urinary tract infection, site not specified: Secondary | ICD-10-CM | POA: Diagnosis not present

## 2015-10-18 DIAGNOSIS — G35 Multiple sclerosis: Secondary | ICD-10-CM

## 2015-10-18 DIAGNOSIS — N183 Chronic kidney disease, stage 3 unspecified: Secondary | ICD-10-CM

## 2015-10-18 DIAGNOSIS — G8250 Quadriplegia, unspecified: Secondary | ICD-10-CM

## 2015-10-18 DIAGNOSIS — L89313 Pressure ulcer of right buttock, stage 3: Secondary | ICD-10-CM

## 2015-10-18 NOTE — Progress Notes (Signed)
Nursing Home Location:  [facility]   Place of Service: SNF (31)  PCP: Gildardo Cranker, DO  No Known Allergies  Chief Complaint  Patient presents with  . Medical Management of Chronic Issues    Routine Visit    HPI:  Patient is a 75 y.o. male seen today at [facility] for routine follow up. Pt at [facility] after hospital stay at [facility] due to left ischial stage 3 ulcer and greater trochanter ulcer stage 3, for debridement of ulcers and skin flap on right ulcer. Pt with hx of paraplegic due to MS, CKD, DM and hyperlipidemia.  Pt currently with foley cath due to urinary retention from neurogenic bladder, previously requiring self cath himself   Review of Systems:  Review of Systems  Constitutional: Negative for activity change and appetite change.  HENT: Negative for congestion.   Respiratory: Negative for cough and shortness of breath.   Cardiovascular: Negative for chest pain, palpitations and leg swelling.  Gastrointestinal: Negative for abdominal pain and constipation.  Genitourinary:       Has foley due to wounds   Musculoskeletal: Negative for myalgias and back pain.  Skin:       Skin flap repair improving   Neurological: Negative for dizziness.  Psychiatric/Behavioral: The patient is not nervous/anxious.     Past Medical History  Diagnosis Date  . Hypertension   . Blood transfusion   . Neuralgia neuritis, sciatic nerve     MS for 30 years  . PONV (postoperative nausea and vomiting)   . MS (multiple sclerosis) ([place])   . Blood transfusion without reported diagnosis   . CKD (chronic kidney disease), stage III   . Anemia in chronic renal disease   . Chronic spastic paraplegia (HCC)     secondary to MS  . Self-catheterizes urinary bladder   . Neurogenic bladder   . History of recurrent UTIs   . History of MRSA infection     urine  . Decubitus ulcer of ischium   . Decreased sensation of lower extremity     due to MS  .  Type 2 diabetes mellitus [facility])    Past Surgical History  Procedure Laterality Date  . Tonsillectomy    . Finger surgery  2004  . Wound debridement    . Extracorporeal shock wave lithotripsy  03-23-2011  . Incision and drainage of wound Left 09/05/2014    Procedure: IRRIGATION AND DEBRIDEMENT OF LEFT ISCHIUM WOUND WITH ;  Surgeon: Theodoro Kos, DO;  Location: [place];  Service: Plastics;  Laterality: Left;  . Application of a-cell of extremity Left 09/05/2014    Procedure: PLACEMENT OF ACELL AND VAC;  Surgeon: Theodoro Kos, DO;  Location: [place];  Service: Plastics;  Laterality: Left;   Social History:   reports that he quit smoking about 32 years ago. His smoking use included Cigars. He has never used smokeless tobacco. He reports that he drinks about 3.6 oz of alcohol per week. He reports that he does not use illicit drugs.  Family History  Problem Relation Age of Onset  . Stroke Father   . Diabetes Father     Medications: Patient's Medications  New Prescriptions   No medications on file  Previous Medications   AMBULATORY NON FORMULARY MEDICATION    MedPass: Give 120 ml twice daily.   ASCORBIC ACID (VITAMIN C PO)    Take by mouth.   ASPIRIN 81 MG TABLET    Take 1  tablet (81 mg total) by mouth daily.   BISACODYL (DULCOLAX) 10 MG SUPPOSITORY    Place 1 suppository (10 mg total) rectally daily as needed for moderate constipation.   CHOLECALCIFEROL (VITAMIN D3) 2000 UNITS TABS    Take 2,000 mg by mouth daily.     DOCUSATE SODIUM (COLACE) 100 MG CAPSULE    Take 100 mg by mouth daily.   FISH OIL-OMEGA-3 FATTY ACIDS 1000 MG CAPSULE    Take 1 capsule (1 g total) by mouth daily.   INSULIN ASPART (NOVOLOG FLEXPEN) 100 UNIT/ML FLEXPEN    Inject 8 Units into the skin 3 (three) times daily with meals.   INSULIN DETEMIR (LEVEMIR) 100 UNIT/ML INJECTION    Inject 18 Units into the skin every morning.   LINACLOTIDE (LINZESS) 145 MCG CAPS CAPSULE    Take 145 mcg by mouth every other day.   MULTIPLE  VITAMIN (MULTIVITAMIN WITH MINERALS) TABS TABLET    Take 1 tablet by mouth daily.   NYSTATIN (MYCOSTATIN) POWDER    Apply to groin twice daily for 2 weeks (stop date 10/29/15), then use as needed for rash.   OXYBUTYNIN (DITROPAN) 5 MG TABLET    Take 5 mg by mouth 3 (three) times daily.    OXYCODONE-ACETAMINOPHEN (PERCOCET/ROXICET) 5-325 MG TABLET    Take 1 tablet by mouth every 6 (six) hours as needed for severe pain.   POLYETHYLENE GLYCOL (MIRALAX / GLYCOLAX) PACKET    Take 17 g by mouth daily.   SIMVASTATIN (ZOCOR) 20 MG TABLET    Take 20 mg by mouth every evening.   TRIMETHOPRIM (TRIMPEX) 100 MG TABLET    Take 100 mg by mouth daily.  Modified Medications   No medications on file  Discontinued Medications   INSULIN DETEMIR (LEVEMIR) 100 UNIT/ML PEN    Inject 22 Units into the skin every morning.     Physical Exam: Filed Vitals:   10/18/15 1335  BP: 130/74  Pulse: 71  Temp: 97.8 F (36.6 C)  TempSrc: Oral  Resp: 20  Height: 6\' 4"  (1.93 m)  Weight: 172 lb 3.2 oz (78.109 kg)    Physical Exam  Constitutional: He is oriented to person, place, and time. He appears well-developed.  Frail appearing lying in bed in NAD  HENT:  Mouth/Throat: Oropharynx is clear and moist.  Eyes: Pupils are equal, round, and reactive to light. No scleral icterus.  Neck: Neck supple. Carotid bruit is not present. No thyromegaly present.  Cardiovascular: Normal rate, regular rhythm, normal heart sounds and intact distal pulses.  Exam reveals no gallop and no friction rub.   No murmur heard. no distal LE swelling. No calf TTP  Pulmonary/Chest: Effort normal and breath sounds normal. He has no wheezes. He has no rales. He exhibits no tenderness.  Abdominal: Soft. Bowel sounds are normal. He exhibits no distension, no abdominal bruit, no pulsatile midline mass and no mass. There is no tenderness. There is no rebound and no guarding.  Genitourinary:  Foley DTG clear yellow urine; min sediment in cath    Musculoskeletal: He exhibits edema.  Right JP drain with large amt drainage but no purulent d/c or bright red blood. Lateral right thigh incision healing well. sutures intact. No secondary signs of infection or wound dehiscience  Lymphadenopathy:    He has no cervical adenopathy.  Neurological: He is alert and oriented to person, place, and time.  LE>UE paralysis with spastic extremities and muscle atrophy noted  Skin: Skin is warm and dry. No rash  noted.     Unna boots intact b/l; small pinpoint opening to right ischial wound  Psychiatric: He has a normal mood and affect. His behavior is normal. Judgment and thought content normal.    Labs reviewed: Basic Metabolic Panel:  Recent Labs  07/09/15 08/02/15 08/12/15  NA 138 138 140  K 4.6 4.2 4.1  BUN  --  30* 31*  CREATININE 1.5* 1.5* 1.4*   Liver Function Tests:  Recent Labs  07/09/15  AST 14  ALT 15  ALKPHOS 94   No results for input(s): LIPASE, AMYLASE in the last 8760 hours. No results for input(s): AMMONIA in the last 8760 hours. CBC:  Recent Labs  08/02/15 08/12/15 08/30/15  WBC 8.3 6.7 6.5  HGB 8.8* 8.8* 9.5*  HCT 27* 28* 30*  PLT 340 261 231   TSH: No results for input(s): TSH in the last 8760 hours. A1C: Lab Results  Component Value Date   HGBA1C 6.1 08/02/2015   Lipid Panel: No results for input(s): CHOL, HDL, LDLCALC, TRIG, CHOLHDL, LDLDIRECT in the last 8760 hours.   Assessment/Plan 1. Right ischial pressure sore, stage III ([place]) S/p flap repair and healing well, Following with wound care nurse and surgeon.    2. Controlled type 2 diabetes mellitus with stage 3 chronic kidney disease, with long-term current use of insulin (HCC) Well controlled on current regimen, conts on levemir and novolog with meals  3. Multiple sclerosis ([place]) With spastic quadriplegia, working with PT/OT to increase strength and goal is to return home at baseline  4. Spastic quadriplegia (HCC) Stable, no increase in  pain, conts to work with therapy  5. Other constipation -stable at this time, constipation controlled and doing well with current regimen. Will cont.   6. Recurrent UTI (urinary tract infection) -stable, no signs of recurrent infection. conts on trimpex daily    Milas Schappell K. Harle Battiest  [facility] & [facility] Medicine [phone] 8 am - 5 pm) [phone] (after hours)

## 2015-10-22 DIAGNOSIS — L89214 Pressure ulcer of right hip, stage 4: Secondary | ICD-10-CM | POA: Diagnosis not present

## 2015-11-06 ENCOUNTER — Non-Acute Institutional Stay (SKILLED_NURSING_FACILITY): Payer: Medicare Other | Admitting: Nurse Practitioner

## 2015-11-06 ENCOUNTER — Encounter: Payer: Self-pay | Admitting: Nurse Practitioner

## 2015-11-06 DIAGNOSIS — N183 Chronic kidney disease, stage 3 (moderate): Secondary | ICD-10-CM

## 2015-11-06 DIAGNOSIS — K5909 Other constipation: Secondary | ICD-10-CM

## 2015-11-06 DIAGNOSIS — G8250 Quadriplegia, unspecified: Secondary | ICD-10-CM

## 2015-11-06 DIAGNOSIS — L89313 Pressure ulcer of right buttock, stage 3: Secondary | ICD-10-CM

## 2015-11-06 DIAGNOSIS — G35 Multiple sclerosis: Secondary | ICD-10-CM | POA: Diagnosis not present

## 2015-11-06 DIAGNOSIS — N390 Urinary tract infection, site not specified: Secondary | ICD-10-CM

## 2015-11-06 DIAGNOSIS — E1122 Type 2 diabetes mellitus with diabetic chronic kidney disease: Secondary | ICD-10-CM | POA: Diagnosis not present

## 2015-11-06 DIAGNOSIS — Z794 Long term (current) use of insulin: Secondary | ICD-10-CM | POA: Diagnosis not present

## 2015-11-06 NOTE — Progress Notes (Signed)
Nursing Home Location: [facility]  Place of Service: SNF (31)  PCP: Gildardo Cranker, DO  No Known Allergies  Chief Complaint  Patient presents with  . Discharge Note    HPI:  Patient is a 75 y.o. male seen today at [facility] for discharge. Pt at [facility] after hospital stay at [facility] due to left ischial stage 3 ulcer and greater trochanter ulcer stage 3, for debridement of ulcers and skin flap on right ulcer. Pt with hx of paraplegic due to MS, CKD, DM and hyperlipidemia.  Pt currently with foley cath due to urinary retention from neurogenic bladder, previously requiring self cath himself. Wounds have healed and patient currently doing well with therapy, now stable to discharge home with home health.   Review of Systems:  Review of Systems  Constitutional: Negative for activity change and appetite change.  HENT: Negative for congestion.   Respiratory: Negative for cough and shortness of breath.   Cardiovascular: Negative for chest pain, palpitations and leg swelling.  Gastrointestinal: Negative for abdominal pain and constipation.  Genitourinary:       Has foley due to wounds   Musculoskeletal: Negative for myalgias and back pain.  Skin:       Skin flap improved  Neurological: Negative for dizziness.  Psychiatric/Behavioral: The patient is not nervous/anxious.     Past Medical History  Diagnosis Date  . Hypertension   . Blood transfusion   . Neuralgia neuritis, sciatic nerve     MS for 30 years  . PONV (postoperative nausea and vomiting)   . MS (multiple sclerosis) ([place])   . Blood transfusion without reported diagnosis   . CKD (chronic kidney disease), stage III   . Anemia in chronic renal disease   . Chronic spastic paraplegia (HCC)     secondary to MS  . Self-catheterizes urinary bladder   . Neurogenic bladder   . History of recurrent UTIs   . History of MRSA infection     urine  . Decubitus ulcer of ischium   . Decreased sensation  of lower extremity     due to MS  . Type 2 diabetes mellitus [facility])    Past Surgical History  Procedure Laterality Date  . Tonsillectomy    . Finger surgery  2004  . Wound debridement    . Extracorporeal shock wave lithotripsy  03-23-2011  . Incision and drainage of wound Left 09/05/2014    Procedure: IRRIGATION AND DEBRIDEMENT OF LEFT ISCHIUM WOUND WITH ;  Surgeon: Theodoro Kos, DO;  Location: [place];  Service: Plastics;  Laterality: Left;  . Application of a-cell of extremity Left 09/05/2014    Procedure: PLACEMENT OF ACELL AND VAC;  Surgeon: Theodoro Kos, DO;  Location: [place];  Service: Plastics;  Laterality: Left;   Social History:   reports that he quit smoking about 32 years ago. His smoking use included Cigars. He has never used smokeless tobacco. He reports that he drinks about 3.6 oz of alcohol per week. He reports that he does not use illicit drugs.  Family History  Problem Relation Age of Onset  . Stroke Father   . Diabetes Father     Medications: Patient's Medications  New Prescriptions   No medications on file  Previous Medications   AMBULATORY NON FORMULARY MEDICATION    MedPass: Give 120 ml twice daily.   ASCORBIC ACID (VITAMIN C PO)    Take by mouth.   ASPIRIN 81 MG TABLET    Take  1 tablet (81 mg total) by mouth daily.   BISACODYL (DULCOLAX) 10 MG SUPPOSITORY    Place 1 suppository (10 mg total) rectally daily as needed for moderate constipation.   CHOLECALCIFEROL (VITAMIN D3) 2000 UNITS TABS    Take 2,000 mg by mouth daily.     FISH OIL-OMEGA-3 FATTY ACIDS 1000 MG CAPSULE    Take 1 capsule (1 g total) by mouth daily.   INSULIN ASPART (NOVOLOG FLEXPEN) 100 UNIT/ML FLEXPEN    Inject 8 Units into the skin 3 (three) times daily with meals.   INSULIN DETEMIR (LEVEMIR) 100 UNIT/ML INJECTION    Inject 18 Units into the skin every morning.   MULTIPLE VITAMIN (MULTIVITAMIN WITH MINERALS) TABS TABLET    Take 1 tablet by mouth daily.   OXYBUTYNIN (DITROPAN) 5 MG TABLET    Take  5 mg by mouth 3 (three) times daily.    OXYCODONE-ACETAMINOPHEN (PERCOCET/ROXICET) 5-325 MG TABLET    Take 1 tablet by mouth every 6 (six) hours as needed for severe pain.   SIMVASTATIN (ZOCOR) 20 MG TABLET    Take 20 mg by mouth every evening.   TRIMETHOPRIM (TRIMPEX) 100 MG TABLET    Take 100 mg by mouth daily.  Modified Medications   No medications on file  Discontinued Medications   DOCUSATE SODIUM (COLACE) 100 MG CAPSULE    Take 100 mg by mouth daily.   LINACLOTIDE (LINZESS) 145 MCG CAPS CAPSULE    Take 145 mcg by mouth every other day.   NYSTATIN (MYCOSTATIN) POWDER    Apply to groin twice daily for 2 weeks (stop date 10/29/15), then use as needed for rash.   POLYETHYLENE GLYCOL (MIRALAX / GLYCOLAX) PACKET    Take 17 g by mouth daily.     Physical Exam: Filed Vitals:   11/06/15 0859  BP: 136/81  Pulse: 72  Temp: 97.6 F (36.4 C)  TempSrc: Oral  Resp: 20  Height: 6\' 4"  (1.93 m)  Weight: 176 lb 3.2 oz (79.924 kg)    Physical Exam  Constitutional: He is oriented to person, place, and time. He appears well-developed and well-nourished. No distress.  HENT:  Head: Normocephalic and atraumatic.  Mouth/Throat: Oropharynx is clear and moist. No oropharyngeal exudate.  Eyes: Conjunctivae and EOM are normal. Pupils are equal, round, and reactive to light.  Neck: Normal range of motion. Neck supple.  Cardiovascular: Normal rate, regular rhythm and normal heart sounds.   Pulmonary/Chest: Effort normal and breath sounds normal.  Abdominal: Soft. Bowel sounds are normal.  Genitourinary:  Foley catheter with clear yellow urine  Musculoskeletal: He exhibits no edema or tenderness.  Atrophied muscle of the lower legs  Neurological: He is alert and oriented to person, place, and time.  Paraplegia due to MS, unna boots on bilaterally  Skin: Skin is warm and dry. He is not diaphoretic.  Psychiatric: He has a normal mood and affect.    Labs reviewed: Basic Metabolic Panel:  Recent  Labs  07/09/15 08/02/15 08/12/15  NA 138 138 140  K 4.6 4.2 4.1  BUN  --  30* 31*  CREATININE 1.5* 1.5* 1.4*   Liver Function Tests:  Recent Labs  07/09/15  AST 14  ALT 15  ALKPHOS 94   No results for input(s): LIPASE, AMYLASE in the last 8760 hours. No results for input(s): AMMONIA in the last 8760 hours. CBC:  Recent Labs  08/02/15 08/12/15 08/30/15  WBC 8.3 6.7 6.5  HGB 8.8* 8.8* 9.5*  HCT 27* 28* 30*  PLT 340 261 231   TSH: No results for input(s): TSH in the last 8760 hours. A1C: Lab Results  Component Value Date   HGBA1C 6.1 08/02/2015   Lipid Panel: No results for input(s): CHOL, HDL, LDLCALC, TRIG, CHOLHDL, LDLDIRECT in the last 8760 hours.   Assessment/Plan 1. Right ischial pressure sore, stage III (HCC) Well healed at this time. Cont pressure reduction, proper glycemic control and good nutrition   2. Controlled type 2 diabetes mellitus with stage 3 chronic kidney disease, with long-term current use of insulin (HCC) A1c at goal. Cont levemir and novolog   3. Multiple sclerosis (HCC) Stable, With spastic quadriplegia. conts on oxybuynin for neurogenic bladder. Will DC foley when he is discharge. Pt has home supplies for i&O cath which he has previously done.   4. Other constipation Improved, currently not on medication   5. Spastic quadriplegia (HCC) Stable   6. Recurrent UTI (urinary tract infection) conts on trimpex daily  pt is stable for discharge-will need PT/OT/Nursing per home health. No DME needed. Rx written.  will need to follow up with PCP within 2 weeks.      Carlos American. Harle Battiest  [facility] & Adult Medicine [phone] 8 am - 5 pm) [phone] (after hours)

## 2015-11-10 DIAGNOSIS — Z7982 Long term (current) use of aspirin: Secondary | ICD-10-CM | POA: Diagnosis not present

## 2015-11-10 DIAGNOSIS — I129 Hypertensive chronic kidney disease with stage 1 through stage 4 chronic kidney disease, or unspecified chronic kidney disease: Secondary | ICD-10-CM | POA: Diagnosis not present

## 2015-11-10 DIAGNOSIS — G8250 Quadriplegia, unspecified: Secondary | ICD-10-CM | POA: Diagnosis not present

## 2015-11-10 DIAGNOSIS — E785 Hyperlipidemia, unspecified: Secondary | ICD-10-CM | POA: Diagnosis not present

## 2015-11-10 DIAGNOSIS — E1122 Type 2 diabetes mellitus with diabetic chronic kidney disease: Secondary | ICD-10-CM | POA: Diagnosis not present

## 2015-11-10 DIAGNOSIS — N183 Chronic kidney disease, stage 3 (moderate): Secondary | ICD-10-CM | POA: Diagnosis not present

## 2015-11-10 DIAGNOSIS — L89622 Pressure ulcer of left heel, stage 2: Secondary | ICD-10-CM | POA: Diagnosis not present

## 2015-11-10 DIAGNOSIS — D631 Anemia in chronic kidney disease: Secondary | ICD-10-CM | POA: Diagnosis not present

## 2015-11-10 DIAGNOSIS — Z87440 Personal history of urinary (tract) infections: Secondary | ICD-10-CM | POA: Diagnosis not present

## 2015-11-10 DIAGNOSIS — G35 Multiple sclerosis: Secondary | ICD-10-CM | POA: Diagnosis not present

## 2015-11-10 DIAGNOSIS — Z794 Long term (current) use of insulin: Secondary | ICD-10-CM | POA: Diagnosis not present

## 2015-11-11 DIAGNOSIS — L89622 Pressure ulcer of left heel, stage 2: Secondary | ICD-10-CM | POA: Diagnosis not present

## 2015-11-11 DIAGNOSIS — G35 Multiple sclerosis: Secondary | ICD-10-CM | POA: Diagnosis not present

## 2015-11-11 DIAGNOSIS — N183 Chronic kidney disease, stage 3 (moderate): Secondary | ICD-10-CM | POA: Diagnosis not present

## 2015-11-11 DIAGNOSIS — E1122 Type 2 diabetes mellitus with diabetic chronic kidney disease: Secondary | ICD-10-CM | POA: Diagnosis not present

## 2015-11-11 DIAGNOSIS — I129 Hypertensive chronic kidney disease with stage 1 through stage 4 chronic kidney disease, or unspecified chronic kidney disease: Secondary | ICD-10-CM | POA: Diagnosis not present

## 2015-11-11 DIAGNOSIS — G8250 Quadriplegia, unspecified: Secondary | ICD-10-CM | POA: Diagnosis not present

## 2015-11-12 DIAGNOSIS — N183 Chronic kidney disease, stage 3 (moderate): Secondary | ICD-10-CM | POA: Diagnosis not present

## 2015-11-12 DIAGNOSIS — L89622 Pressure ulcer of left heel, stage 2: Secondary | ICD-10-CM | POA: Diagnosis not present

## 2015-11-12 DIAGNOSIS — G8250 Quadriplegia, unspecified: Secondary | ICD-10-CM | POA: Diagnosis not present

## 2015-11-12 DIAGNOSIS — G35 Multiple sclerosis: Secondary | ICD-10-CM | POA: Diagnosis not present

## 2015-11-12 DIAGNOSIS — E1122 Type 2 diabetes mellitus with diabetic chronic kidney disease: Secondary | ICD-10-CM | POA: Diagnosis not present

## 2015-11-12 DIAGNOSIS — I129 Hypertensive chronic kidney disease with stage 1 through stage 4 chronic kidney disease, or unspecified chronic kidney disease: Secondary | ICD-10-CM | POA: Diagnosis not present

## 2015-11-13 DIAGNOSIS — E1122 Type 2 diabetes mellitus with diabetic chronic kidney disease: Secondary | ICD-10-CM | POA: Diagnosis not present

## 2015-11-13 DIAGNOSIS — L89622 Pressure ulcer of left heel, stage 2: Secondary | ICD-10-CM | POA: Diagnosis not present

## 2015-11-13 DIAGNOSIS — I129 Hypertensive chronic kidney disease with stage 1 through stage 4 chronic kidney disease, or unspecified chronic kidney disease: Secondary | ICD-10-CM | POA: Diagnosis not present

## 2015-11-13 DIAGNOSIS — G8250 Quadriplegia, unspecified: Secondary | ICD-10-CM | POA: Diagnosis not present

## 2015-11-13 DIAGNOSIS — G35 Multiple sclerosis: Secondary | ICD-10-CM | POA: Diagnosis not present

## 2015-11-13 DIAGNOSIS — N183 Chronic kidney disease, stage 3 (moderate): Secondary | ICD-10-CM | POA: Diagnosis not present

## 2015-11-14 DIAGNOSIS — Z794 Long term (current) use of insulin: Secondary | ICD-10-CM | POA: Diagnosis not present

## 2015-11-14 DIAGNOSIS — E119 Type 2 diabetes mellitus without complications: Secondary | ICD-10-CM | POA: Diagnosis not present

## 2015-11-14 DIAGNOSIS — G35 Multiple sclerosis: Secondary | ICD-10-CM | POA: Diagnosis not present

## 2015-11-14 DIAGNOSIS — D631 Anemia in chronic kidney disease: Secondary | ICD-10-CM | POA: Diagnosis not present

## 2015-11-14 DIAGNOSIS — L8990 Pressure ulcer of unspecified site, unspecified stage: Secondary | ICD-10-CM | POA: Diagnosis not present

## 2015-11-14 DIAGNOSIS — I129 Hypertensive chronic kidney disease with stage 1 through stage 4 chronic kidney disease, or unspecified chronic kidney disease: Secondary | ICD-10-CM | POA: Diagnosis not present

## 2015-11-14 DIAGNOSIS — N183 Chronic kidney disease, stage 3 (moderate): Secondary | ICD-10-CM | POA: Diagnosis not present

## 2015-11-15 DIAGNOSIS — E1122 Type 2 diabetes mellitus with diabetic chronic kidney disease: Secondary | ICD-10-CM | POA: Diagnosis not present

## 2015-11-15 DIAGNOSIS — I129 Hypertensive chronic kidney disease with stage 1 through stage 4 chronic kidney disease, or unspecified chronic kidney disease: Secondary | ICD-10-CM | POA: Diagnosis not present

## 2015-11-15 DIAGNOSIS — G8250 Quadriplegia, unspecified: Secondary | ICD-10-CM | POA: Diagnosis not present

## 2015-11-15 DIAGNOSIS — N183 Chronic kidney disease, stage 3 (moderate): Secondary | ICD-10-CM | POA: Diagnosis not present

## 2015-11-15 DIAGNOSIS — G35 Multiple sclerosis: Secondary | ICD-10-CM | POA: Diagnosis not present

## 2015-11-15 DIAGNOSIS — L89622 Pressure ulcer of left heel, stage 2: Secondary | ICD-10-CM | POA: Diagnosis not present

## 2015-11-18 DIAGNOSIS — G35 Multiple sclerosis: Secondary | ICD-10-CM | POA: Diagnosis not present

## 2015-11-18 DIAGNOSIS — G8250 Quadriplegia, unspecified: Secondary | ICD-10-CM | POA: Diagnosis not present

## 2015-11-18 DIAGNOSIS — I129 Hypertensive chronic kidney disease with stage 1 through stage 4 chronic kidney disease, or unspecified chronic kidney disease: Secondary | ICD-10-CM | POA: Diagnosis not present

## 2015-11-18 DIAGNOSIS — L89622 Pressure ulcer of left heel, stage 2: Secondary | ICD-10-CM | POA: Diagnosis not present

## 2015-11-18 DIAGNOSIS — E1122 Type 2 diabetes mellitus with diabetic chronic kidney disease: Secondary | ICD-10-CM | POA: Diagnosis not present

## 2015-11-18 DIAGNOSIS — N183 Chronic kidney disease, stage 3 (moderate): Secondary | ICD-10-CM | POA: Diagnosis not present

## 2015-11-19 DIAGNOSIS — I129 Hypertensive chronic kidney disease with stage 1 through stage 4 chronic kidney disease, or unspecified chronic kidney disease: Secondary | ICD-10-CM | POA: Diagnosis not present

## 2015-11-19 DIAGNOSIS — G8250 Quadriplegia, unspecified: Secondary | ICD-10-CM | POA: Diagnosis not present

## 2015-11-19 DIAGNOSIS — N183 Chronic kidney disease, stage 3 (moderate): Secondary | ICD-10-CM | POA: Diagnosis not present

## 2015-11-19 DIAGNOSIS — L89622 Pressure ulcer of left heel, stage 2: Secondary | ICD-10-CM | POA: Diagnosis not present

## 2015-11-19 DIAGNOSIS — E1122 Type 2 diabetes mellitus with diabetic chronic kidney disease: Secondary | ICD-10-CM | POA: Diagnosis not present

## 2015-11-19 DIAGNOSIS — G35 Multiple sclerosis: Secondary | ICD-10-CM | POA: Diagnosis not present

## 2015-11-20 DIAGNOSIS — G35 Multiple sclerosis: Secondary | ICD-10-CM | POA: Diagnosis not present

## 2015-11-20 DIAGNOSIS — N183 Chronic kidney disease, stage 3 (moderate): Secondary | ICD-10-CM | POA: Diagnosis not present

## 2015-11-20 DIAGNOSIS — G8250 Quadriplegia, unspecified: Secondary | ICD-10-CM | POA: Diagnosis not present

## 2015-11-20 DIAGNOSIS — L89622 Pressure ulcer of left heel, stage 2: Secondary | ICD-10-CM | POA: Diagnosis not present

## 2015-11-20 DIAGNOSIS — E1122 Type 2 diabetes mellitus with diabetic chronic kidney disease: Secondary | ICD-10-CM | POA: Diagnosis not present

## 2015-11-20 DIAGNOSIS — I129 Hypertensive chronic kidney disease with stage 1 through stage 4 chronic kidney disease, or unspecified chronic kidney disease: Secondary | ICD-10-CM | POA: Diagnosis not present

## 2015-11-21 DIAGNOSIS — N183 Chronic kidney disease, stage 3 (moderate): Secondary | ICD-10-CM | POA: Diagnosis not present

## 2015-11-21 DIAGNOSIS — L89622 Pressure ulcer of left heel, stage 2: Secondary | ICD-10-CM | POA: Diagnosis not present

## 2015-11-21 DIAGNOSIS — G35 Multiple sclerosis: Secondary | ICD-10-CM | POA: Diagnosis not present

## 2015-11-21 DIAGNOSIS — E1122 Type 2 diabetes mellitus with diabetic chronic kidney disease: Secondary | ICD-10-CM | POA: Diagnosis not present

## 2015-11-21 DIAGNOSIS — I129 Hypertensive chronic kidney disease with stage 1 through stage 4 chronic kidney disease, or unspecified chronic kidney disease: Secondary | ICD-10-CM | POA: Diagnosis not present

## 2015-11-21 DIAGNOSIS — G8250 Quadriplegia, unspecified: Secondary | ICD-10-CM | POA: Diagnosis not present

## 2015-11-22 DIAGNOSIS — G35 Multiple sclerosis: Secondary | ICD-10-CM | POA: Diagnosis not present

## 2015-11-22 DIAGNOSIS — G8250 Quadriplegia, unspecified: Secondary | ICD-10-CM | POA: Diagnosis not present

## 2015-11-22 DIAGNOSIS — N183 Chronic kidney disease, stage 3 (moderate): Secondary | ICD-10-CM | POA: Diagnosis not present

## 2015-11-22 DIAGNOSIS — L89622 Pressure ulcer of left heel, stage 2: Secondary | ICD-10-CM | POA: Diagnosis not present

## 2015-11-22 DIAGNOSIS — I129 Hypertensive chronic kidney disease with stage 1 through stage 4 chronic kidney disease, or unspecified chronic kidney disease: Secondary | ICD-10-CM | POA: Diagnosis not present

## 2015-11-22 DIAGNOSIS — E1122 Type 2 diabetes mellitus with diabetic chronic kidney disease: Secondary | ICD-10-CM | POA: Diagnosis not present

## 2015-11-25 DIAGNOSIS — G35 Multiple sclerosis: Secondary | ICD-10-CM | POA: Diagnosis not present

## 2015-11-25 DIAGNOSIS — E1122 Type 2 diabetes mellitus with diabetic chronic kidney disease: Secondary | ICD-10-CM | POA: Diagnosis not present

## 2015-11-25 DIAGNOSIS — L89622 Pressure ulcer of left heel, stage 2: Secondary | ICD-10-CM | POA: Diagnosis not present

## 2015-11-25 DIAGNOSIS — G8250 Quadriplegia, unspecified: Secondary | ICD-10-CM | POA: Diagnosis not present

## 2015-11-25 DIAGNOSIS — I129 Hypertensive chronic kidney disease with stage 1 through stage 4 chronic kidney disease, or unspecified chronic kidney disease: Secondary | ICD-10-CM | POA: Diagnosis not present

## 2015-11-25 DIAGNOSIS — N183 Chronic kidney disease, stage 3 (moderate): Secondary | ICD-10-CM | POA: Diagnosis not present

## 2015-11-27 DIAGNOSIS — I129 Hypertensive chronic kidney disease with stage 1 through stage 4 chronic kidney disease, or unspecified chronic kidney disease: Secondary | ICD-10-CM | POA: Diagnosis not present

## 2015-11-27 DIAGNOSIS — L89622 Pressure ulcer of left heel, stage 2: Secondary | ICD-10-CM | POA: Diagnosis not present

## 2015-11-27 DIAGNOSIS — G8250 Quadriplegia, unspecified: Secondary | ICD-10-CM | POA: Diagnosis not present

## 2015-11-27 DIAGNOSIS — G35 Multiple sclerosis: Secondary | ICD-10-CM | POA: Diagnosis not present

## 2015-11-27 DIAGNOSIS — N183 Chronic kidney disease, stage 3 (moderate): Secondary | ICD-10-CM | POA: Diagnosis not present

## 2015-11-27 DIAGNOSIS — E1122 Type 2 diabetes mellitus with diabetic chronic kidney disease: Secondary | ICD-10-CM | POA: Diagnosis not present

## 2015-11-28 DIAGNOSIS — G8250 Quadriplegia, unspecified: Secondary | ICD-10-CM | POA: Diagnosis not present

## 2015-11-28 DIAGNOSIS — N183 Chronic kidney disease, stage 3 (moderate): Secondary | ICD-10-CM | POA: Diagnosis not present

## 2015-11-28 DIAGNOSIS — E1122 Type 2 diabetes mellitus with diabetic chronic kidney disease: Secondary | ICD-10-CM | POA: Diagnosis not present

## 2015-11-28 DIAGNOSIS — G35 Multiple sclerosis: Secondary | ICD-10-CM | POA: Diagnosis not present

## 2015-11-28 DIAGNOSIS — L89622 Pressure ulcer of left heel, stage 2: Secondary | ICD-10-CM | POA: Diagnosis not present

## 2015-11-28 DIAGNOSIS — I129 Hypertensive chronic kidney disease with stage 1 through stage 4 chronic kidney disease, or unspecified chronic kidney disease: Secondary | ICD-10-CM | POA: Diagnosis not present

## 2015-12-02 DIAGNOSIS — L89622 Pressure ulcer of left heel, stage 2: Secondary | ICD-10-CM | POA: Diagnosis not present

## 2015-12-02 DIAGNOSIS — G35 Multiple sclerosis: Secondary | ICD-10-CM | POA: Diagnosis not present

## 2015-12-02 DIAGNOSIS — E1122 Type 2 diabetes mellitus with diabetic chronic kidney disease: Secondary | ICD-10-CM | POA: Diagnosis not present

## 2015-12-02 DIAGNOSIS — I129 Hypertensive chronic kidney disease with stage 1 through stage 4 chronic kidney disease, or unspecified chronic kidney disease: Secondary | ICD-10-CM | POA: Diagnosis not present

## 2015-12-02 DIAGNOSIS — G8250 Quadriplegia, unspecified: Secondary | ICD-10-CM | POA: Diagnosis not present

## 2015-12-02 DIAGNOSIS — N183 Chronic kidney disease, stage 3 (moderate): Secondary | ICD-10-CM | POA: Diagnosis not present

## 2015-12-03 DIAGNOSIS — Z5189 Encounter for other specified aftercare: Secondary | ICD-10-CM | POA: Diagnosis not present

## 2015-12-04 DIAGNOSIS — L89622 Pressure ulcer of left heel, stage 2: Secondary | ICD-10-CM | POA: Diagnosis not present

## 2015-12-04 DIAGNOSIS — G35 Multiple sclerosis: Secondary | ICD-10-CM | POA: Diagnosis not present

## 2015-12-04 DIAGNOSIS — I129 Hypertensive chronic kidney disease with stage 1 through stage 4 chronic kidney disease, or unspecified chronic kidney disease: Secondary | ICD-10-CM | POA: Diagnosis not present

## 2015-12-04 DIAGNOSIS — G8250 Quadriplegia, unspecified: Secondary | ICD-10-CM | POA: Diagnosis not present

## 2015-12-04 DIAGNOSIS — N183 Chronic kidney disease, stage 3 (moderate): Secondary | ICD-10-CM | POA: Diagnosis not present

## 2015-12-04 DIAGNOSIS — E1122 Type 2 diabetes mellitus with diabetic chronic kidney disease: Secondary | ICD-10-CM | POA: Diagnosis not present

## 2015-12-06 DIAGNOSIS — G8250 Quadriplegia, unspecified: Secondary | ICD-10-CM | POA: Diagnosis not present

## 2015-12-06 DIAGNOSIS — N183 Chronic kidney disease, stage 3 (moderate): Secondary | ICD-10-CM | POA: Diagnosis not present

## 2015-12-06 DIAGNOSIS — I129 Hypertensive chronic kidney disease with stage 1 through stage 4 chronic kidney disease, or unspecified chronic kidney disease: Secondary | ICD-10-CM | POA: Diagnosis not present

## 2015-12-06 DIAGNOSIS — L89622 Pressure ulcer of left heel, stage 2: Secondary | ICD-10-CM | POA: Diagnosis not present

## 2015-12-06 DIAGNOSIS — G35 Multiple sclerosis: Secondary | ICD-10-CM | POA: Diagnosis not present

## 2015-12-06 DIAGNOSIS — E1122 Type 2 diabetes mellitus with diabetic chronic kidney disease: Secondary | ICD-10-CM | POA: Diagnosis not present

## 2015-12-09 DIAGNOSIS — I129 Hypertensive chronic kidney disease with stage 1 through stage 4 chronic kidney disease, or unspecified chronic kidney disease: Secondary | ICD-10-CM | POA: Diagnosis not present

## 2015-12-09 DIAGNOSIS — L89622 Pressure ulcer of left heel, stage 2: Secondary | ICD-10-CM | POA: Diagnosis not present

## 2015-12-09 DIAGNOSIS — E1122 Type 2 diabetes mellitus with diabetic chronic kidney disease: Secondary | ICD-10-CM | POA: Diagnosis not present

## 2015-12-09 DIAGNOSIS — N183 Chronic kidney disease, stage 3 (moderate): Secondary | ICD-10-CM | POA: Diagnosis not present

## 2015-12-09 DIAGNOSIS — G35 Multiple sclerosis: Secondary | ICD-10-CM | POA: Diagnosis not present

## 2015-12-09 DIAGNOSIS — G8250 Quadriplegia, unspecified: Secondary | ICD-10-CM | POA: Diagnosis not present

## 2015-12-10 DIAGNOSIS — E1122 Type 2 diabetes mellitus with diabetic chronic kidney disease: Secondary | ICD-10-CM | POA: Diagnosis not present

## 2015-12-10 DIAGNOSIS — G35 Multiple sclerosis: Secondary | ICD-10-CM | POA: Diagnosis not present

## 2015-12-10 DIAGNOSIS — G8250 Quadriplegia, unspecified: Secondary | ICD-10-CM | POA: Diagnosis not present

## 2015-12-10 DIAGNOSIS — I129 Hypertensive chronic kidney disease with stage 1 through stage 4 chronic kidney disease, or unspecified chronic kidney disease: Secondary | ICD-10-CM | POA: Diagnosis not present

## 2015-12-10 DIAGNOSIS — L89622 Pressure ulcer of left heel, stage 2: Secondary | ICD-10-CM | POA: Diagnosis not present

## 2015-12-10 DIAGNOSIS — N183 Chronic kidney disease, stage 3 (moderate): Secondary | ICD-10-CM | POA: Diagnosis not present

## 2015-12-11 DIAGNOSIS — L89622 Pressure ulcer of left heel, stage 2: Secondary | ICD-10-CM | POA: Diagnosis not present

## 2015-12-11 DIAGNOSIS — I129 Hypertensive chronic kidney disease with stage 1 through stage 4 chronic kidney disease, or unspecified chronic kidney disease: Secondary | ICD-10-CM | POA: Diagnosis not present

## 2015-12-11 DIAGNOSIS — E1122 Type 2 diabetes mellitus with diabetic chronic kidney disease: Secondary | ICD-10-CM | POA: Diagnosis not present

## 2015-12-11 DIAGNOSIS — G8250 Quadriplegia, unspecified: Secondary | ICD-10-CM | POA: Diagnosis not present

## 2015-12-11 DIAGNOSIS — G35 Multiple sclerosis: Secondary | ICD-10-CM | POA: Diagnosis not present

## 2015-12-11 DIAGNOSIS — N183 Chronic kidney disease, stage 3 (moderate): Secondary | ICD-10-CM | POA: Diagnosis not present

## 2015-12-12 DIAGNOSIS — E1122 Type 2 diabetes mellitus with diabetic chronic kidney disease: Secondary | ICD-10-CM | POA: Diagnosis not present

## 2015-12-12 DIAGNOSIS — L89622 Pressure ulcer of left heel, stage 2: Secondary | ICD-10-CM | POA: Diagnosis not present

## 2015-12-12 DIAGNOSIS — G8250 Quadriplegia, unspecified: Secondary | ICD-10-CM | POA: Diagnosis not present

## 2015-12-12 DIAGNOSIS — N390 Urinary tract infection, site not specified: Secondary | ICD-10-CM | POA: Diagnosis not present

## 2015-12-12 DIAGNOSIS — N183 Chronic kidney disease, stage 3 (moderate): Secondary | ICD-10-CM | POA: Diagnosis not present

## 2015-12-12 DIAGNOSIS — I129 Hypertensive chronic kidney disease with stage 1 through stage 4 chronic kidney disease, or unspecified chronic kidney disease: Secondary | ICD-10-CM | POA: Diagnosis not present

## 2015-12-12 DIAGNOSIS — G35 Multiple sclerosis: Secondary | ICD-10-CM | POA: Diagnosis not present

## 2015-12-18 DIAGNOSIS — E1122 Type 2 diabetes mellitus with diabetic chronic kidney disease: Secondary | ICD-10-CM | POA: Diagnosis not present

## 2015-12-18 DIAGNOSIS — I129 Hypertensive chronic kidney disease with stage 1 through stage 4 chronic kidney disease, or unspecified chronic kidney disease: Secondary | ICD-10-CM | POA: Diagnosis not present

## 2015-12-18 DIAGNOSIS — G8250 Quadriplegia, unspecified: Secondary | ICD-10-CM | POA: Diagnosis not present

## 2015-12-18 DIAGNOSIS — G35 Multiple sclerosis: Secondary | ICD-10-CM | POA: Diagnosis not present

## 2015-12-18 DIAGNOSIS — L89622 Pressure ulcer of left heel, stage 2: Secondary | ICD-10-CM | POA: Diagnosis not present

## 2015-12-18 DIAGNOSIS — N183 Chronic kidney disease, stage 3 (moderate): Secondary | ICD-10-CM | POA: Diagnosis not present

## 2015-12-19 DIAGNOSIS — N183 Chronic kidney disease, stage 3 (moderate): Secondary | ICD-10-CM | POA: Diagnosis not present

## 2015-12-19 DIAGNOSIS — I129 Hypertensive chronic kidney disease with stage 1 through stage 4 chronic kidney disease, or unspecified chronic kidney disease: Secondary | ICD-10-CM | POA: Diagnosis not present

## 2015-12-19 DIAGNOSIS — L89622 Pressure ulcer of left heel, stage 2: Secondary | ICD-10-CM | POA: Diagnosis not present

## 2015-12-19 DIAGNOSIS — G8250 Quadriplegia, unspecified: Secondary | ICD-10-CM | POA: Diagnosis not present

## 2015-12-19 DIAGNOSIS — G35 Multiple sclerosis: Secondary | ICD-10-CM | POA: Diagnosis not present

## 2015-12-19 DIAGNOSIS — E1122 Type 2 diabetes mellitus with diabetic chronic kidney disease: Secondary | ICD-10-CM | POA: Diagnosis not present

## 2015-12-25 DIAGNOSIS — G8250 Quadriplegia, unspecified: Secondary | ICD-10-CM | POA: Diagnosis not present

## 2015-12-25 DIAGNOSIS — G35 Multiple sclerosis: Secondary | ICD-10-CM | POA: Diagnosis not present

## 2015-12-25 DIAGNOSIS — N183 Chronic kidney disease, stage 3 (moderate): Secondary | ICD-10-CM | POA: Diagnosis not present

## 2015-12-25 DIAGNOSIS — L89622 Pressure ulcer of left heel, stage 2: Secondary | ICD-10-CM | POA: Diagnosis not present

## 2015-12-25 DIAGNOSIS — E1122 Type 2 diabetes mellitus with diabetic chronic kidney disease: Secondary | ICD-10-CM | POA: Diagnosis not present

## 2015-12-25 DIAGNOSIS — I129 Hypertensive chronic kidney disease with stage 1 through stage 4 chronic kidney disease, or unspecified chronic kidney disease: Secondary | ICD-10-CM | POA: Diagnosis not present

## 2015-12-26 DIAGNOSIS — G8250 Quadriplegia, unspecified: Secondary | ICD-10-CM | POA: Diagnosis not present

## 2015-12-26 DIAGNOSIS — N183 Chronic kidney disease, stage 3 (moderate): Secondary | ICD-10-CM | POA: Diagnosis not present

## 2015-12-26 DIAGNOSIS — G35 Multiple sclerosis: Secondary | ICD-10-CM | POA: Diagnosis not present

## 2015-12-26 DIAGNOSIS — E1122 Type 2 diabetes mellitus with diabetic chronic kidney disease: Secondary | ICD-10-CM | POA: Diagnosis not present

## 2015-12-26 DIAGNOSIS — I129 Hypertensive chronic kidney disease with stage 1 through stage 4 chronic kidney disease, or unspecified chronic kidney disease: Secondary | ICD-10-CM | POA: Diagnosis not present

## 2015-12-26 DIAGNOSIS — L89622 Pressure ulcer of left heel, stage 2: Secondary | ICD-10-CM | POA: Diagnosis not present

## 2015-12-31 ENCOUNTER — Encounter: Payer: Medicare Other | Attending: Internal Medicine | Admitting: Internal Medicine

## 2015-12-31 DIAGNOSIS — E785 Hyperlipidemia, unspecified: Secondary | ICD-10-CM | POA: Insufficient documentation

## 2015-12-31 DIAGNOSIS — N183 Chronic kidney disease, stage 3 (moderate): Secondary | ICD-10-CM | POA: Diagnosis not present

## 2015-12-31 DIAGNOSIS — D649 Anemia, unspecified: Secondary | ICD-10-CM | POA: Insufficient documentation

## 2015-12-31 DIAGNOSIS — G8220 Paraplegia, unspecified: Secondary | ICD-10-CM | POA: Diagnosis not present

## 2015-12-31 DIAGNOSIS — L89312 Pressure ulcer of right buttock, stage 2: Secondary | ICD-10-CM | POA: Insufficient documentation

## 2015-12-31 DIAGNOSIS — Z87891 Personal history of nicotine dependence: Secondary | ICD-10-CM | POA: Diagnosis not present

## 2015-12-31 DIAGNOSIS — E1122 Type 2 diabetes mellitus with diabetic chronic kidney disease: Secondary | ICD-10-CM | POA: Insufficient documentation

## 2015-12-31 DIAGNOSIS — G35 Multiple sclerosis: Secondary | ICD-10-CM | POA: Insufficient documentation

## 2015-12-31 DIAGNOSIS — I129 Hypertensive chronic kidney disease with stage 1 through stage 4 chronic kidney disease, or unspecified chronic kidney disease: Secondary | ICD-10-CM | POA: Insufficient documentation

## 2015-12-31 DIAGNOSIS — Z794 Long term (current) use of insulin: Secondary | ICD-10-CM | POA: Diagnosis not present

## 2015-12-31 NOTE — Progress Notes (Signed)
Lance, [name] ([id number]) Visit Report for 12/31/2015 Abuse/Suicide Risk Screen Details Lance [name] Date of Service: 12/31/2015 9:30 AM Patient Name: J. Patient Account Number: [id number] Medical Record Treating RN: Ahmed Prima [id number] Number: Other Clinician: Date of Birth/Sex: [birth year] (74 y.o. Male) Treating Dellia Nims, Lance Primary Care Physician/Extender: Eulah Citizen Physician: Referring Physician: Lavena Bullion in Treatment: 0 Abuse/Suicide Risk Screen Items Answer ABUSE/SUICIDE RISK SCREEN: Has anyone close to you tried to hurt or harm you recentlyo No Do you feel uncomfortable with anyone in your familyo No Has anyone forced you do things that you didnot want to doo No Do you have any thoughts of harming yourselfo No Patient displays signs or symptoms of abuse and/or neglect. No Electronic Signature(s) Signed: 12/31/2015 4:22:14 PM By: Alric Quan Entered By: Alric Quan on 12/31/2015 10:02:37 [name] ([id number]) -------------------------------------------------------------------------------- Activities of Daily Living Details Lance [name] Date of Service: 12/31/2015 9:30 AM Patient Name: J. Patient Account Number: [id number] Medical Record Treating RN: Ahmed Prima [id number] Number: Other Clinician: Date of Birth/Sex: [birth year] (75 y.o. Male) Treating Dellia Nims, Lance Primary Care Physician/Extender: Eulah Citizen Physician: Referring Physician: Lavena Bullion in Treatment: 0 Activities of Daily Living Items Answer Activities of Daily Living (Please select one for each item) Drive Automobile Not Able Take Medications Completely Able Use Telephone Completely Able Care for Appearance Need Assistance Use Toilet Need Assistance Bath / Shower Need Assistance Dress Self Need Assistance Feed Self Completely Able Walk Not Able Get In / Out Bed Need Assistance Housework Not Able Prepare Meals  Need Assistance Handle Money Need Assistance Shop for Self Not Able Electronic Signature(s) Signed: 12/31/2015 4:22:14 PM By: Alric Quan Entered By: Alric Quan on 12/31/2015 10:03:22 [name] ([id number]) -------------------------------------------------------------------------------- Education Assessment Details Lance [name] Date of Service: 12/31/2015 9:30 AM Patient Name: J. Patient Account Number: [id number] Medical Record Treating RN: Ahmed Prima [id number] Number: Other Clinician: Date of Birth/Sex: [birth year] (75 y.o. Male) Treating [name], Lance Primary Care Physician/Extender: Eulah Citizen Physician: Referring Physician: Lavena Bullion in Treatment: 0 Primary Learner Assessed: Patient Learning Preferences/Education Level/Primary Language Learning Preference: Explanation, Printed Material Highest Education Level: College or Above Preferred Language: English Cognitive Barrier Assessment/Beliefs Language Barrier: No Translator Needed: No Memory Deficit: No Emotional Barrier: No Cultural/Religious Beliefs Affecting Medical No Care: Physical Barrier Assessment Impaired Vision: Yes Glasses Impaired Hearing: No Decreased Hand dexterity: No Knowledge/Comprehension Assessment Knowledge Level: High Comprehension Level: High Ability to understand written High instructions: Ability to understand verbal High instructions: Motivation Assessment Anxiety Level: Calm Cooperation: Cooperative Education Importance: Acknowledges Need Interest in Health Problems: Asks Questions Perception: Coherent Willingness to Engage in Self- High Management Activities: High [name], Lance ([id number]) Readiness to Engage in Self- Management Activities: Electronic Signature(s) Signed: 12/31/2015 4:22:14 PM By: Alric Quan Entered By: Alric Quan on 12/31/2015 10:03:42 [name]  ([id number]) -------------------------------------------------------------------------------- Fall Risk Assessment Details Lance [name] Date of Service: 12/31/2015 9:30 AM Patient Name: J. Patient Account Number: [id number] Medical Record Treating RN: Ahmed Prima [id number] Number: Other Clinician: Date of Birth/Sex: Feb 16, 1941 (74 y.o. Male) Treating [name], Lance Primary Care Physician/Extender: Eulah Citizen Physician: Referring Physician: Lavena Bullion in Treatment: 0 Fall Risk Assessment Items Have you had 2 or more falls in the last 12 monthso 0 No Have you had any fall that resulted in injury in the last 12 monthso 0 No FALL RISK ASSESSMENT: History of falling - immediate or within 3 months 0 No Secondary diagnosis 15 Yes Ambulatory aid None/bed rest/wheelchair/nurse  0 Yes Crutches/cane/walker 0 No Furniture 0 No IV Access/Saline Lock 0 No Gait/Training Normal/bed rest/immobile 0 No Weak 10 Yes Impaired 20 Yes Mental Status Oriented to own ability 0 Yes Electronic Signature(s) Signed: 12/31/2015 4:22:14 PM By: Alric Quan Entered By: Alric Quan on 12/31/2015 10:04:03 [name] ([id number]) -------------------------------------------------------------------------------- Foot Assessment Details Lance [name] Date of Service: 12/31/2015 9:30 AM Patient Name: J. Patient Account Number: [id number] Medical Record Treating RN: Ahmed Prima [id number] Number: Other Clinician: Date of Birth/Sex: Jun 22, 1940 (74 y.o. Male) Treating [name], Lance Primary Care Physician/Extender: Eulah Citizen Physician: Referring Physician: Lavena Bullion in Treatment: 0 Foot Assessment Items Site Locations + = Sensation present, - = Sensation absent, C = Callus, U = Ulcer R = Redness, W = Warmth, M = Maceration, PU = Pre-ulcerative lesion F = Fissure, S = Swelling, D = Dryness Assessment Right: Left: Other Deformity: No  No Prior Foot Ulcer: No No Prior Amputation: No No Charcot Joint: No No Ambulatory Status: Gait: Electronic Signature(s) Signed: 12/31/2015 4:22:14 PM By: Alric Quan Entered By: Alric Quan on 12/31/2015 10:04:29 [name] ([id number]) [name] ([id number]) -------------------------------------------------------------------------------- Nutrition Risk Assessment Details Lance [name] Date of Service: 12/31/2015 9:30 AM Patient Name: J. Patient Account Number: [id number] Medical Record Treating RN: Ahmed Prima [id number] Number: Other Clinician: Date of Birth/Sex: 1940/11/22 (74 y.o. Male) Treating [name], Lance Primary Care Physician/Extender: Eulah Citizen Physician: Referring Physician: Lavena Bullion in Treatment: 0 Height (in): 76 Weight (lbs): 180 Body Mass Index (BMI): 21.9 Nutrition Risk Assessment Items NUTRITION RISK SCREEN: I have an illness or condition that made me change the kind and/or 0 No amount of food I eat I eat fewer than two meals per day 0 No I eat few fruits and vegetables, or milk products 0 No I have three or more drinks of beer, liquor or wine almost every day 0 No I have tooth or mouth problems that make it hard for me to eat 0 No I don't always have enough money to buy the food I need 0 No I eat alone most of the time 0 No I take three or more different prescribed or over-the-counter drugs a 1 Yes day Without wanting to, I have lost or gained 10 pounds in the last six 0 No months I am not always physically able to shop, cook and/or feed myself 2 Yes Nutrition Protocols Good Risk Protocol Moderate Risk Protocol Electronic Signature(s) Signed: 12/31/2015 4:22:14 PM By: Alric Quan Entered By: Alric Quan on 12/31/2015 10:04:20

## 2016-01-02 DIAGNOSIS — N183 Chronic kidney disease, stage 3 (moderate): Secondary | ICD-10-CM | POA: Diagnosis not present

## 2016-01-02 DIAGNOSIS — I129 Hypertensive chronic kidney disease with stage 1 through stage 4 chronic kidney disease, or unspecified chronic kidney disease: Secondary | ICD-10-CM | POA: Diagnosis not present

## 2016-01-02 DIAGNOSIS — G8250 Quadriplegia, unspecified: Secondary | ICD-10-CM | POA: Diagnosis not present

## 2016-01-02 DIAGNOSIS — E1122 Type 2 diabetes mellitus with diabetic chronic kidney disease: Secondary | ICD-10-CM | POA: Diagnosis not present

## 2016-01-02 DIAGNOSIS — L89622 Pressure ulcer of left heel, stage 2: Secondary | ICD-10-CM | POA: Diagnosis not present

## 2016-01-02 DIAGNOSIS — G35 Multiple sclerosis: Secondary | ICD-10-CM | POA: Diagnosis not present

## 2016-01-02 NOTE — Progress Notes (Signed)
EDISON, COLVARD ([id number]) Visit Report for 12/31/2015 Allergy List Details [name] Date of Service: 12/31/2015 9:30 AM Patient Name: J. Patient Account Number: [id number] Medical Record Treating RN: Ahmed Prima [id number] Number: Other Clinician: Date of Birth/Sex: [birth year] (74 y.o. Male) Treating Linton Ham Primary Care Physician: Lavone Orn Physician/Extender: G Referring Physician: Lavena Bullion in Treatment: 0 Allergies Active Allergies NKDA Allergy Notes Electronic Signature(s) Signed: 12/31/2015 4:22:14 PM By: Alric Quan Entered By: Alric Quan on 12/31/2015 09:53:50 [name] ([id number]) -------------------------------------------------------------------------------- Arrival Information Details [name] Date of Service: 12/31/2015 9:30 AM Patient Name: J. Patient Account Number: [id number] Medical Record Treating RN: Ahmed Prima [id number] Number: Other Clinician: Date of Birth/Sex: 07/05/1940 (74 y.o. Male) Treating Linton Ham Primary Care Physician: Lavone Orn Physician/Extender: G Referring Physician: Lavena Bullion in Treatment: 0 Visit Information Patient Arrived: Wheel Chair Arrival Time: 09:43 Accompanied By: wife Transfer Assistance: Civil Service fast streamer Patient Identification Verified: Yes Secondary Verification Process Yes Completed: Patient Requires Transmission-Based No Precautions: Patient Has Alerts: Yes Patient Alerts: DM II Electronic Signature(s) Signed: 12/31/2015 4:22:14 PM By: Alric Quan Entered By: Alric Quan on 12/31/2015 09:44:32 [name] ([id number]) -------------------------------------------------------------------------------- Clinic Level of Care Assessment Details [name] Date of Service: 12/31/2015 9:30 AM Patient Name: J. Patient Account Number: [id number] Medical Record Treating RN: Ahmed Prima [id number] Number: Other Clinician: Date of Birth/Sex: March 07, 1941 (74 y.o. Male) Treating Linton Ham Primary Care Physician: Lavone Orn Physician/Extender: G Referring Physician: Lavena Bullion in Treatment: 0 Clinic Level of Care Assessment Items TOOL 1 Quantity Score X - Use when EandM and Procedure is performed on INITIAL visit 1 0 ASSESSMENTS - Nursing Assessment / Reassessment X - General Physical Exam (combine w/ comprehensive assessment (listed just 1 20 below) when performed on new pt. evals) X - Comprehensive Assessment (HX, ROS, Risk Assessments, Wounds Hx, etc.) 1 25 ASSESSMENTS - Wound and Skin Assessment / Reassessment []  - Dermatologic / Skin Assessment (not related to wound area) 0 ASSESSMENTS - Ostomy and/or Continence Assessment and Care []  - Incontinence Assessment and Management 0 []  - Ostomy Care Assessment and Management (repouching, etc.) 0 PROCESS - Coordination of Care []  - Simple Patient / Family Education for ongoing care 0 X - Complex (extensive) Patient / Family Education for ongoing care 1 20 X - Staff obtains Programmer, systems, Records, Test Results / Process Orders 1 10 X - Staff telephones HHA, Nursing Homes / Clarify orders / etc 1 10 []  - Routine Transfer to another Facility (non-emergent condition) 0 []  - Routine Hospital Admission (non-emergent condition) 0 X - New Admissions / Biomedical engineer / Ordering NPWT, Apligraf, etc. 1 15 []  - Emergency Hospital Admission (emergent condition) 0 PROCESS - Special Needs []  - Pediatric / Minor Patient Management 0 ALEXJANDRO, STEELMAN ([id number]) []  - Isolation Patient Management 0 []  - Hearing / Language / Visual special needs 0 []  - Assessment of Community assistance (transportation, D/C planning, etc.) 0 []  - Additional assistance / Altered mentation 0 []  - Support Surface(s) Assessment (bed, cushion, seat, etc.) 0 INTERVENTIONS - Miscellaneous []  - External ear exam 0 X - Patient  Transfer (multiple staff / Civil Service fast streamer / Similar devices) 1 10 []  - Simple Staple / Suture removal (25 or less) 0 []  - Complex Staple / Suture removal (26 or more) 0 []  - Hypo/Hyperglycemic Management (do not check if billed separately) 0 []  - Ankle / Brachial Index (ABI) - do not check if billed separately 0 Has the patient been seen at  the hospital within the last three years: Yes Total Score: 110 Level Of Care: New/Established - Level 3 Electronic Signature(s) Signed: 12/31/2015 4:22:14 PM By: Alric Quan Entered By: Alric Quan on 12/31/2015 15:36:30 [name] ([id number]) -------------------------------------------------------------------------------- Encounter Discharge Information Details [name] Date of Service: 12/31/2015 9:30 AM Patient Name: J. Patient Account Number: [id number] Medical Record Treating RN: Ahmed Prima [id number] Number: Other Clinician: Date of Birth/Sex: 03-Apr-1941 (74 y.o. Male) Treating Linton Ham Primary Care Physician: Lavone Orn Physician/Extender: G Referring Physician: Lavena Bullion in Treatment: 0 Encounter Discharge Information Items Discharge Pain Level: 0 Discharge Condition: Stable Ambulatory Status: Wheelchair Discharge Destination: Home Transportation: Private Auto Accompanied By: wife Schedule Follow-up Appointment: Yes Medication Reconciliation completed and provided to Patient/Care No Ami Thornsberry: Provided on Clinical Summary of Care: 12/31/2015 Form Type Recipient Paper Patient FR Electronic Signature(s) Signed: 12/31/2015 10:54:52 AM By: Ruthine Dose Entered By: Ruthine Dose on 12/31/2015 10:54:52 [name] ([id number]) -------------------------------------------------------------------------------- Lower Extremity Assessment Details [name] Date of Service: 12/31/2015 9:30 AM Patient Name: J. Patient Account Number: [id number] Medical Record Treating RN:  Ahmed Prima [id number] Number: Other Clinician: Date of Birth/Sex: Oct 16, 1940 (74 y.o. Male) Treating Linton Ham Primary Care Physician: Lavone Orn Physician/Extender: G Referring Physician: Lavena Bullion in Treatment: 0 Electronic Signature(s) Signed: 12/31/2015 4:22:14 PM By: Alric Quan Entered By: Alric Quan on 12/31/2015 09:47:39 [name] ([id number]) -------------------------------------------------------------------------------- Multi Wound Chart Details [name] Date of Service: 12/31/2015 9:30 AM Patient Name: J. Patient Account Number: [id number] Medical Record Treating RN: Ahmed Prima [id number] Number: Other Clinician: Date of Birth/Sex: 1940-12-02 (74 y.o. Male) Treating Linton Ham Primary Care Physician: Lavone Orn Physician/Extender: G Referring Physician: Lavena Bullion in Treatment: 0 Vital Signs Height(in): 76 Pulse(bpm): 86 Weight(lbs): 180 Blood Pressure 131/67 (mmHg): Body Mass Index(BMI): 22 Temperature(F): 97.8 Respiratory Rate 18 (breaths/min): Photos: [1:No Photos] [N/A:N/A] Wound Location: [1:Right Gluteal fold] [N/A:N/A] Wounding Event: [1:Pressure Injury] [N/A:N/A] Primary Etiology: [1:Pressure Ulcer] [N/A:N/A] Comorbid History: [1:Anemia, Hypertension, Type II Diabetes, Paraplegia] [N/A:N/A] Date Acquired: [1:12/17/2015] [N/A:N/A] Weeks of Treatment: [1:0] [N/A:N/A] Wound Status: [1:Open] [N/A:N/A] Measurements L x W x D 2.5x4.6x0.1 [N/A:N/A] (cm) Area (cm) : [1:9.032] [N/A:N/A] Volume (cm) : [1:0.903] [N/A:N/A] Classification: [1:Category/Stage II] [N/A:N/A] Exudate Amount: [1:Large] [N/A:N/A] Exudate Type: [1:Serous] [N/A:N/A] Exudate Color: [1:amber] [N/A:N/A] Wound Margin: [1:Distinct, outline attached] [N/A:N/A] Granulation Amount: [1:Medium (34-66%)] [N/A:N/A] Granulation Quality: [1:Red, Pink] [N/A:N/A] Necrotic Amount: [1:Medium (34-66%)]  [N/A:N/A] Necrotic Tissue: [1:Eschar, Adherent Slough] [N/A:N/A] Exposed Structures: [1:Fascia: No Fat: No Tendon: No Muscle: No Joint: No] [N/A:N/A] Bone: No Limited to Skin Breakdown Epithelialization: None N/A N/A Periwound Skin Texture: No Abnormalities Noted N/A N/A Periwound Skin Maceration: Yes N/A N/A Moisture: Moist: Yes Periwound Skin Color: No Abnormalities Noted N/A N/A Temperature: No Abnormality N/A N/A Tenderness on Yes N/A N/A Palpation: Wound Preparation: Ulcer Cleansing: N/A N/A Rinsed/Irrigated with Saline Topical Anesthetic Applied: Other: lidocaine 4% Treatment Notes Electronic Signature(s) Signed: 12/31/2015 4:22:14 PM By: Alric Quan Entered By: Alric Quan on 12/31/2015 10:23:40 [name] ([id number]) -------------------------------------------------------------------------------- Multi-Disciplinary Care Plan Details [name] Date of Service: 12/31/2015 9:30 AM Patient Name: J. Patient Account Number: [id number] Medical Record Treating RN: Ahmed Prima [id number] Number: Other Clinician: Date of Birth/Sex: [birth year] (74 y.o. Male) Treating Linton Ham Primary Care Physician: Lavone Orn Physician/Extender: G Referring Physician: Lavena Bullion in Treatment: 0 Active Inactive Abuse / Safety / Falls / Self Care Management Nursing Diagnoses: Potential for falls Goals: Patient will remain injury free Date Initiated: 12/31/2015 Goal Status: Active Interventions:  Assess fall risk on admission and as needed Assess self care needs on admission and as needed Notes: Orientation to the Wound Care Program Nursing Diagnoses: Knowledge deficit related to the wound healing center program Goals: Patient/caregiver will verbalize understanding of the [facility] Date Initiated: 12/31/2015 Goal Status: Active Interventions: Provide education on orientation to the wound  center Notes: Pressure Nursing Diagnoses: Knowledge deficit related to causes and risk factors for pressure ulcer development Knowledge deficit related to management of pressures ulcers OSSIEL, PITZEN ([id number]) Goals: Patient will remain free from development of additional pressure ulcers Date Initiated: 12/31/2015 Goal Status: Active Interventions: Assess: immobility, friction, shearing, incontinence upon admission and as needed Assess offloading mechanisms upon admission and as needed Assess potential for pressure ulcer upon admission and as needed Notes: Soft Tissue Infection Nursing Diagnoses: Impaired tissue integrity Goals: Patient will remain free of wound infection Date Initiated: 12/31/2015 Goal Status: Active Interventions: Assess signs and symptoms of infection every visit Notes: Electronic Signature(s) Signed: 12/31/2015 4:22:14 PM By: Alric Quan Entered By: Alric Quan on 12/31/2015 10:23:23 [name] ([id number]) -------------------------------------------------------------------------------- Pain Assessment Details [name] Date of Service: 12/31/2015 9:30 AM Patient Name: J. Patient Account Number: [id number] Medical Record Treating RN: Ahmed Prima [id number] Number: Other Clinician: Date of Birth/Sex: [birth year] (74 y.o. Male) Treating Linton Ham Primary Care Physician: Lavone Orn Physician/Extender: G Referring Physician: Lavena Bullion in Treatment: 0 Active Problems Location of Pain Severity and Description of Pain Patient Has Paino No Site Locations With Dressing Change: No Pain Management and Medication Current Pain Management: Electronic Signature(s) Signed: 12/31/2015 4:22:14 PM By: Alric Quan Entered By: Alric Quan on 12/31/2015 09:44:42 [name] ([id number]) -------------------------------------------------------------------------------- Patient/Caregiver  Education Details [name] Date of Service: 12/31/2015 9:30 AM Patient Name: J. Patient Account Number: [id number] Medical Record Treating RN: Ahmed Prima [id number] Number: Other Clinician: Date of Birth/Gender: 01/20/1941 (74 y.o. Male) Treating Linton Ham Primary Care Physician: Lavone Orn Physician/Extender: G Referring Physician: Lavena Bullion in Treatment: 0 Education Assessment Education Provided To: Patient Education Topics Provided Welcome To [facility]: Handouts: Welcome To [facility] Methods: Explain/Verbal Wound/Skin Impairment: Handouts: Other: change dressing as ordered Methods: Demonstration, Explain/Verbal Responses: State content correctly Electronic Signature(s) Signed: 12/31/2015 4:22:14 PM By: Alric Quan Entered By: Alric Quan on 12/31/2015 10:31:42 [name] ([id number]) -------------------------------------------------------------------------------- Wound Assessment Details [name] Date of Service: 12/31/2015 9:30 AM Patient Name: J. Patient Account Number: [id number] Medical Record Treating RN: Ahmed Prima [id number] Number: Other Clinician: Date of Birth/Sex: [birth year] (74 y.o. Male) Treating [name], [place] Primary Care Physician: Lavone Orn Physician/Extender: G Referring Physician: Lavena Bullion in Treatment: 0 Wound Status Wound Number: 1 Primary Pressure Ulcer Etiology: Wound Location: Right Gluteal fold Wound Status: Open Wounding Event: Pressure Injury Comorbid Anemia, Hypertension, Type II Date Acquired: 12/17/2015 History: Diabetes, Paraplegia Weeks Of Treatment: 0 Clustered Wound: No Photos Photo Uploaded By: Alric Quan on 12/31/2015 15:49:59 Wound Measurements Length: (cm) 2.5 Width: (cm) 4.6 Depth: (cm) 0.1 Area: (cm) 9.032 Volume: (cm) 0.903 % Reduction in Area: % Reduction in Volume: Epithelialization:  None Tunneling: No Undermining: No Wound Description Classification: Category/Stage II Wound Margin: Distinct, outline attached Exudate Amount: Large Exudate Type: Serous Exudate Color: amber Foul Odor After Cleansing: No Wound Bed Granulation Amount: Medium (34-66%) Exposed Structure Granulation Quality: Red, Pink Fascia Exposed: No Necrotic Amount: Medium (34-66%) Fat Layer Exposed: No JACKOB, VANANTWERP ([id number]) Necrotic Quality: Eschar, Adherent Slough Tendon Exposed: No Muscle Exposed: No  Joint Exposed: No Bone Exposed: No Limited to Skin Breakdown Periwound Skin Texture Texture Color No Abnormalities Noted: No No Abnormalities Noted: No Moisture Temperature / Pain No Abnormalities Noted: No Temperature: No Abnormality Maceration: Yes Tenderness on Palpation: Yes Moist: Yes Wound Preparation Ulcer Cleansing: Rinsed/Irrigated with Saline Topical Anesthetic Applied: Other: lidocaine 4%, Treatment Notes Wound #1 (Right Gluteal fold) 1. Cleansed with: Clean wound with Normal Saline 2. Anesthetic Topical Lidocaine 4% cream to wound bed prior to debridement 3. Peri-wound Care: Skin Prep 4. Dressing Applied: Iodoflex 5. Secondary Dressing Applied Bordered Foam Dressing Electronic Signature(s) Signed: 12/31/2015 4:22:14 PM By: Alric Quan Entered By: Alric Quan on 12/31/2015 10:12:28 [name] ([id number]) -------------------------------------------------------------------------------- Vitals Details [name] Date of Service: 12/31/2015 9:30 AM Patient Name: J. Patient Account Number: [id number] Medical Record Treating RN: Ahmed Prima [id number] Number: Other Clinician: Date of Birth/Sex: 31-Dec-1940 (74 y.o. Male) Treating [name], [place] Primary Care Physician: Lavone Orn Physician/Extender: G Referring Physician: Lavena Bullion in Treatment: 0 Vital Signs Time Taken: 09:44 Temperature (F): 97.8 Height  (in): 76 Pulse (bpm): 86 Source: Stated Respiratory Rate (breaths/min): 18 Weight (lbs): 180 Blood Pressure (mmHg): 131/67 Source: Stated Reference Range: 80 - 120 mg / dl Body Mass Index (BMI): 21.9 Electronic Signature(s) Signed: 12/31/2015 4:22:14 PM By: Alric Quan Entered By: Alric Quan on 12/31/2015 09:47:34

## 2016-01-02 NOTE — Progress Notes (Addendum)
BURNS, SKLUZACEK ([id number]) Visit Report for 12/31/2015 Chief Complaint Document Details Lance [name] Date of Service: 12/31/2015 9:30 AM Patient Name: J. Patient Account Number: [id number] Medical Record Treating RN: Ahmed Prima [id number] Number: Other Clinician: Date of Birth/Sex: 1940/06/16 (74 y.o. Male) Treating Dellia Nims, [name] Primary Care Physician/Extender: Eulah Citizen Physician: Referring Physician: Lavena Bullion in Treatment: 0 Information Obtained from: Patient Chief Complaint Patient is here for wound over his right initial tuberosity which is a pressure ulcer in the setting of multiple sclerosis Electronic Signature(s) Signed: 01/01/2016 4:32:31 PM By: Linton Ham MD Entered By: Linton Ham on 12/31/2015 10:47:58 Lance [name] ([id number]) -------------------------------------------------------------------------------- Debridement Details Lance [name] Date of Service: 12/31/2015 9:30 AM Patient Name: J. Patient Account Number: [id number] Medical Record Treating RN: Ahmed Prima [id number] Number: Other Clinician: Date of Birth/Sex: 1941/01/08 (74 y.o. Male) Treating [name], [name] Primary Care Physician/Extender: Eulah Citizen Physician: Referring Physician: Lavena Bullion in Treatment: 0 Debridement Performed for Wound #1 Right Gluteal fold Assessment: Performed By: Physician Ricard Dillon, MD Debridement: Debridement Pre-procedure Yes - 10:22 Verification/Time Out Taken: Start Time: 10:22 Pain Control: Other : lidocaine 4% cream Level: Skin/Subcutaneous Tissue Total Area Debrided (L x 2.5 (cm) x 4.6 (cm) = 11.5 (cm) W): Tissue and other Viable, Non-Viable, Exudate, Fibrin/Slough, Subcutaneous material debrided: Instrument: Curette Bleeding: Minimum Hemostasis Achieved: Pressure End Time: 10:27 Procedural Pain: 0 Post Procedural Pain: 0 Response to Treatment: Procedure was tolerated  well Post Debridement Measurements of Total Wound Length: (cm) 2.5 Stage: Category/Stage II Width: (cm) 4.6 Depth: (cm) 0.2 Volume: (cm) 1.806 Character of Wound/Ulcer Post Requires Further Debridement: Debridement Severity of Tissue Post Limited to breakdown of skin Debridement: Post Procedure Diagnosis Same as Pre-procedure Electronic Signature(s) [name], [name] ([id number]) Signed: 12/31/2015 4:22:14 PM By: Alric Quan Signed: 01/01/2016 4:32:31 PM By: Linton Ham MD Entered By: Linton Ham on 12/31/2015 10:47:28 Lance [name] ([id number]) -------------------------------------------------------------------------------- HPI Details Lance [name] Date of Service: 12/31/2015 9:30 AM Patient Name: J. Patient Account Number: [id number] Medical Record Treating RN: Ahmed Prima [id number] Number: Other Clinician: Date of Birth/Sex: [birth year] (74 y.o. Male) Treating [name], [name] Primary Care Physician/Extender: Eulah Citizen Physician: Referring Physician: Lavena Bullion in Treatment: 0 History of Present Illness HPI Description: 12/31/15; this is a patient I know from previous wound care and are [facility] clinic. He has advanced multiple sclerosis and is nonambulatory.He had a multitude of wounds are was staying [facility] r when I last saw him in February 2017 he had a stage III wound over his right greater trochanter and a stage IV wound over his left ischial tuberosity. He was taken to plastic surgery by Dr. Luetta Nutting at [facility] and had a myocutaneous flap closure bilaterally during an admission to hospital from 07/15/15 through 07/24/15. He was ultimately discharged to [place] where per the patient's description final closure of a small draining area on the right was obtained. Roughly 3-3-1/2 weeks ago both him and his wife noted reopening of an area just under his right ischial tuberosity he  has advanced Homecare and there applying wet to dry dressings. Electronic Signature(s) Signed: 02/26/2016 7:47:00 AM By: Linton Ham MD Previous Signature: 01/01/2016 4:32:31 PM Version By: Linton Ham MD Entered By: Linton Ham on 01/16/2016 08:00:03 Lance [name] ([id number]) -------------------------------------------------------------------------------- Physical Exam Details Lance [name] Date of Service: 12/31/2015 9:30 AM Patient Name: J. Patient Account Number: [id number] Medical Record Treating RN: Ahmed Prima [id number] Number: Other Clinician: Date of Birth/Sex: 07-30-1940 (74  y.o. Male) Treating [name], [name] Primary Care Physician/Extender: Eulah Citizen Physician: Referring Physician: Lavena Bullion in Treatment: 0 Constitutional Sitting or standing Blood Pressure is within target range for patient.. Pulse regular and within target range for patient.Marland Kitchen Respirations regular, non-labored and within target range.. Temperature is normal and within the target range for the patient.. Patient's appearance is neat and clean. Appears in no acute distress. Well nourished and well developed.Marland Kitchen Respiratory Respiratory effort is easy and symmetric bilaterally. Rate is normal at rest and on room air.. Bilateral breath sounds are clear and equal in all lobes with no wheezes, rales or rhonchi.. Cardiovascular Heart rhythm and rate regular, without murmur or gallop. He appears euvolemic. Gastrointestinal (GI) Abdomen is soft and non-distended without masses or tenderness. Bowel sounds active in all quadrants.. No liver or spleen enlargement or tenderness.. Lymphatic Nonpalpable in the popliteal or inguinal area. Integumentary (Hair, Skin) Patient has healed myocutaneous flaps over the right greater trochanter and the left issue.Marland Kitchen Psychiatric No evidence of depression, anxiety, or agitation. Calm, cooperative, and communicative.  Appropriate interactions and affect.. Notes Wound exam; the area in question is just under his right initial tuberosity. This is a stage II wound with moderate length and width. The concerning thing about this is a degree of adherent surface slough. In the center of this there appears to be more nonviable tissue and I am somewhat concerned about this. There is no evidence of surrounding soft tissue infection no crepitus is noted. Electronic Signature(s) Signed: 01/01/2016 4:32:31 PM By: Linton Ham MD Entered By: Linton Ham on 12/31/2015 11:26:01 Lance [name] ([id number]) -------------------------------------------------------------------------------- Physician Orders Details Lance [name] Date of Service: 12/31/2015 9:30 AM Patient Name: J. Patient Account Number: [id number] Medical Record Treating RN: Ahmed Prima [id number] Number: Other Clinician: Date of Birth/Sex: [birth year] (74 y.o. Male) Treating [name], [name] Primary Care Physician/Extender: Eulah Citizen Physician: Referring Physician: Lavena Bullion in Treatment: 0 Verbal / Phone Orders: Yes ClinicianCarolyne Fiscal, Debi Read Back and Verified: Yes Diagnosis Coding Wound Cleansing Wound #1 Right Gluteal fold o Clean wound with Normal Saline. o Cleanse wound with mild soap and water Anesthetic Wound #1 Right Gluteal fold o Topical Lidocaine 4% cream applied to wound bed prior to debridement - clinic use only Skin Barriers/Peri-Wound Care Wound #1 Right Gluteal fold o Skin Prep Primary Wound Dressing Wound #1 Right Gluteal fold o Iodoflex Secondary Dressing Wound #1 Right Gluteal fold o Boardered Foam Dressing Dressing Change Frequency Wound #1 Right Gluteal fold o Change dressing every other day. Follow-up Appointments Wound #1 Right Gluteal fold o Return Appointment in 2 weeks. Off-Loading Wound #1 Right Gluteal fold o Turn and reposition every 2  hours DHAVAL, RADCLIFF ([id number]) Additional Orders / Instructions Wound #1 Right Gluteal fold o Increase protein intake. Home Health Wound #1 Right Gluteal fold o Continue Home Health Visits - [place] Nurse may visit PRN to address patientos wound care needs. o FACE TO FACE ENCOUNTER: MEDICARE and MEDICAID PATIENTS: I certify that this patient is under my care and that I had a face-to-face encounter that meets the physician face-to-face encounter requirements with this patient on this date. The encounter with the patient was in whole or in part for the following MEDICAL CONDITION: (primary reason for [place]) MEDICAL NECESSITY: I certify, that based on my findings, NURSING services are a medically necessary home health service. HOME BOUND STATUS: I certify that my clinical findings support that this patient is homebound (i.e., Due to  illness or injury, pt requires aid of supportive devices such as crutches, cane, wheelchairs, walkers, the use of special transportation or the assistance of another person to leave their place of residence. There is a normal inability to leave the home and doing so requires considerable and taxing effort. Other absences are for medical reasons / religious services and are infrequent or of short duration when for other reasons). o If current dressing causes regression in wound condition, may D/C ordered dressing product/s and apply Normal Saline Moist Dressing daily until next [place] / Other MD appointment. [place] of regression in wound condition at [phone]. o Please direct any NON-WOUND related issues/requests for orders to patient's Primary Care Physician Electronic Signature(s) Signed: 12/31/2015 4:22:14 PM By: Alric Quan Signed: 01/01/2016 4:32:31 PM By: Linton Ham MD Entered By: Alric Quan on 12/31/2015 10:34:18 Lance [name]  ([id number]) -------------------------------------------------------------------------------- Problem List Details Lance [name] Date of Service: 12/31/2015 9:30 AM Patient Name: J. Patient Account Number: [id number] Medical Record Treating RN: Ahmed Prima [id number] Number: Other Clinician: Date of Birth/Sex: [birth year] (74 y.o. Male) Treating [name], [name] Primary Care Physician/Extender: Eulah Citizen Physician: Referring Physician: Lavena Bullion in Treatment: 0 Active Problems ICD-10 Encounter Code Description Active Date Diagnosis L89.312 Pressure ulcer of right buttock, stage 2 12/31/2015 Yes G35 Multiple sclerosis 12/31/2015 Yes Inactive Problems Resolved Problems Electronic Signature(s) Signed: 01/01/2016 4:32:31 PM By: Linton Ham MD Entered By: Linton Ham on 12/31/2015 10:46:46 Lance [name] ([id number]) -------------------------------------------------------------------------------- Progress Note Details Lance [name] Date of Service: 12/31/2015 9:30 AM Patient Name: J. Patient Account Number: [id number] Medical Record Treating RN: Ahmed Prima [id number] Number: Other Clinician: Date of Birth/Sex: [birth year] (74 y.o. Male) Treating [name], [name] Primary Care Physician/Extender: Eulah Citizen Physician: Referring Physician: Lavena Bullion in Treatment: 0 Subjective Chief Complaint Information obtained from Patient Patient is here for wound over his right initial tuberosity which is a pressure ulcer in the setting of multiple sclerosis History of Present Illness (HPI) 12/31/15; this is a patient I know from previous wound care and are [facility] clinic. He has advanced multiple sclerosis and is nonambulatory. He had a multitude of wounds are was staying [place] however when I last saw him in February 2017 he had a stage III wound over his right greater trochanter and a stage IV wound over his left ischial  tuberosity. He was taken to plastic surgery by Dr. Luetta Nutting at [facility] and had a myocutaneous flap closure bilaterally during an admission to hospital from 07/15/15 through 07/24/15. He was ultimately discharged to [place] where per the patient's description final closure of a small draining area on the right was obtained. Roughly 3-3-1/2 weeks ago both him and his wife noted reopening of an area just under his right ischial tuberosity he has advanced Homecare and there applying wet to dry dressings. Wound History Patient presents with 1 open wound that has been present for approximately 2 weeks. Patient has been treating wound in the following manner: wet to dry. Laboratory tests have not been performed in the last month. Patient reportedly has tested positive for an antibiotic resistant organism. Patient reportedly has tested positive for osteomyelitis. Patient reportedly has not had testing performed to evaluate circulation in the legs. Patient experiences the following problems associated with their wounds: swelling. Patient History Information obtained from Patient. Allergies NKDA Family History Diabetes - Father, Stroke - Father, Paternal Grandparents, No family history of Cancer, Heart Disease, Hereditary Spherocytosis, Hypertension, Kidney Disease,  Lung Disease, Seizures, Thyroid Problems, Tuberculosis. HANNAN, MCNAMAR ([id number]) Social History Former smoker - quit 30 years ago, cigar, Marital Status - Married, Alcohol Use - Daily - glass of wine a day, Drug Use - No History, Caffeine Use - Never. Medical History Hematologic/Lymphatic Patient has history of Anemia Cardiovascular Patient has history of Hypertension Endocrine Patient has history of Type II Diabetes Neurologic Patient has history of Paraplegia - d/t MS Patient is treated with Insulin. Blood sugar is tested. Review of Systems (ROS) Constitutional Symptoms (General  Health) The patient has no complaints or symptoms. Eyes Complains or has symptoms of Glasses / Contacts. Ear/Nose/Mouth/Throat The patient has no complaints or symptoms. Respiratory The patient has no complaints or symptoms. Cardiovascular hyperlipidemia Gastrointestinal The patient has no complaints or symptoms. Genitourinary CKD stage III self caths self neurogenic bladder recurrent UTIs Immunological The patient has no complaints or symptoms. Integumentary (Skin) Complains or has symptoms of Wounds. Neurologic MS neuralgia neuritis Oncologic The patient has no complaints or symptoms. Psychiatric The patient has no complaints or symptoms. Objective DENISON, FARWELL ([id number]) Constitutional Sitting or standing Blood Pressure is within target range for patient.. Pulse regular and within target range for patient.Marland Kitchen Respirations regular, non-labored and within target range.. Temperature is normal and within the target range for the patient.. Patient's appearance is neat and clean. Appears in no acute distress. Well nourished and well developed.. Vitals Time Taken: 9:44 AM, Height: 76 in, Source: Stated, Weight: 180 lbs, Source: Stated, BMI: 21.9, Temperature: 97.8 F, Pulse: 86 bpm, Respiratory Rate: 18 breaths/min, Blood Pressure: 131/67 mmHg. Respiratory Respiratory effort is easy and symmetric bilaterally. Rate is normal at rest and on room air.. Bilateral breath sounds are clear and equal in all lobes with no wheezes, rales or rhonchi.. Cardiovascular Heart rhythm and rate regular, without murmur or gallop. He appears euvolemic. Gastrointestinal (GI) Abdomen is soft and non-distended without masses or tenderness. Bowel sounds active in all quadrants.. No liver or spleen enlargement or tenderness.. Lymphatic Nonpalpable in the popliteal or inguinal area. Psychiatric No evidence of depression, anxiety, or agitation. Calm, cooperative, and communicative.  Appropriate interactions and affect.. General Notes: Wound exam; the area in question is just under his right initial tuberosity. This is a stage II wound with moderate length and width. The concerning thing about this is a degree of adherent surface slough. In the center of this there appears to be more nonviable tissue and I am somewhat concerned about this. There is no evidence of surrounding soft tissue infection no crepitus is noted. Integumentary (Hair, Skin) Patient has healed myocutaneous flaps over the right greater trochanter and the left issue.. Wound #1 status is Open. Original cause of wound was Pressure Injury. The wound is located on the Right Gluteal fold. The wound measures 2.5cm length x 4.6cm width x 0.1cm depth; 9.032cm^2 area and 0.903cm^3 volume. The wound is limited to skin breakdown. There is no tunneling or undermining noted. There is a large amount of serous drainage noted. The wound margin is distinct with the outline attached to the wound base. There is medium (34-66%) red, pink granulation within the wound bed. There is a medium (34-66%) amount of necrotic tissue within the wound bed including Eschar and Adherent Slough. The periwound skin appearance exhibited: Maceration, Moist. Periwound temperature was noted as No Abnormality. The periwound has tenderness on palpation. Assessment OMARION, AQUIRRE ([id number]) Active Problems ICD-10 [phone] - Pressure ulcer of right buttock, stage 2 G35 - Multiple sclerosis Procedures Wound #1 Wound #  1 is a Pressure Ulcer located on the Right Gluteal fold . There was a Skin/Subcutaneous Tissue Debridement [id number]) debridement with total area of 11.5 sq cm performed by Ricard Dillon, MD. with the following instrument(s): Curette to remove Viable and Non-Viable tissue/material including Exudate, Fibrin/Slough, and Subcutaneous after achieving pain control using Other (lidocaine 4% cream). A time out was conducted  at 10:22, prior to the start of the procedure. A Minimum amount of bleeding was controlled with Pressure. The procedure was tolerated well with a pain level of 0 throughout and a pain level of 0 following the procedure. Post Debridement Measurements: 2.5cm length x 4.6cm width x 0.2cm depth; 1.806cm^3 volume. Post debridement Stage noted as Category/Stage II. Character of Wound/Ulcer Post Debridement requires further debridement. Severity of Tissue Post Debridement is: Limited to breakdown of skin. Post procedure Diagnosis Wound #1: Same as Pre-Procedure Plan Wound Cleansing: Wound #1 Right Gluteal fold: Clean wound with Normal Saline. Cleanse wound with mild soap and water Anesthetic: Wound #1 Right Gluteal fold: Topical Lidocaine 4% cream applied to wound bed prior to debridement - clinic use only Skin Barriers/Peri-Wound Care: Wound #1 Right Gluteal fold: Skin Prep Primary Wound Dressing: Wound #1 Right Gluteal fold: Iodoflex Secondary Dressing: Wound #1 Right Gluteal fold: Boardered Foam Dressing JAQUORI, HUNSINGER ([id number]) Dressing Change Frequency: Wound #1 Right Gluteal fold: Change dressing every other day. Follow-up Appointments: Wound #1 Right Gluteal fold: Return Appointment in 2 weeks. Off-Loading: Wound #1 Right Gluteal fold: Turn and reposition every 2 hours Additional Orders / Instructions: Wound #1 Right Gluteal fold: Increase protein intake. Home Health: Wound #1 Right Gluteal fold: Continue Home Health Visits - [place] Nurse may visit PRN to address patient s wound care needs. FACE TO FACE ENCOUNTER: MEDICARE and MEDICAID PATIENTS: I certify that this patient is under my care and that I had a face-to-face encounter that meets the physician face-to-face encounter requirements with this patient on this date. The encounter with the patient was in whole or in part for the following MEDICAL CONDITION: (primary reason for [place]) MEDICAL  NECESSITY: I certify, that based on my findings, NURSING services are a medically necessary home health service. HOME BOUND STATUS: I certify that my clinical findings support that this patient is homebound (i.e., Due to illness or injury, pt requires aid of supportive devices such as crutches, cane, wheelchairs, walkers, the use of special transportation or the assistance of another person to leave their place of residence. There is a normal inability to leave the home and doing so requires considerable and taxing effort. Other absences are for medical reasons / religious services and are infrequent or of short duration when for other reasons). If current dressing causes regression in wound condition, may D/C ordered dressing product/s and apply Normal Saline Moist Dressing daily until next [place] / Other MD appointment. [place] of regression in wound condition at [phone]. Please direct any NON-WOUND related issues/requests for orders to patient's Primary Care Physician #1 the surface of this wound will need to be debrided before any healing can occur. I did mechanical debridement today this is not easy. That of Santyl I've gone right to Iodoflex I think this might help more with the bioburden as well as a surface slough. #2 there are numerous mechanical issues here given his advanced MS which is left him nonambulatory. He has a protective mattress and a wheelchair cushion. His nutritional status seems to be stable. It may be I'll  be seeing him in the [place] clinic as getting him in and out of his minivan was a Visual merchandiser) Signed: 01/01/2016 4:32:31 PM By: Linton Ham MD Entered By: Linton Ham on 12/31/2015 11:28:02 Lance [name] ([id number]) AHKING, PRETZER ([id number]) -------------------------------------------------------------------------------- ROS/PFSH Details Lance [name] Date of Service:  12/31/2015 9:30 AM Patient Name: J. Patient Account Number: [id number] Medical Record Treating RN: Ahmed Prima [id number] Number: Other Clinician: Date of Birth/Sex: 1940-05-29 (74 y.o. Male) Treating [name], [name] Primary Care Physician/Extender: Eulah Citizen Physician: Referring Physician: Lavena Bullion in Treatment: 0 Information Obtained From Patient Wound History Do you currently have one or more open woundso Yes How many open wounds do you currently haveo 1 Approximately how long have you had your woundso 2 weeks How have you been treating your wound(s) until nowo wet to dry Has your wound(s) ever healed and then re-openedo No Have you had any lab work done in the past montho No Have you tested positive for an antibiotic resistant organism (MRSA, VRE)o Yes Have you tested positive for osteomyelitis (bone infection)o Yes Have you had any tests for circulation on your legso No Have you had other problems associated with your woundso Swelling Eyes Complaints and Symptoms: Positive for: Glasses / Contacts Integumentary (Skin) Complaints and Symptoms: Positive for: Wounds Constitutional Symptoms (General Health) Complaints and Symptoms: No Complaints or Symptoms Ear/Nose/Mouth/Throat Complaints and Symptoms: No Complaints or Symptoms Hematologic/Lymphatic Medical History: Positive for: Anemia JOHANTHAN, BROAD ([id number]) Respiratory Complaints and Symptoms: No Complaints or Symptoms Cardiovascular Complaints and Symptoms: Review of System Notes: hyperlipidemia Medical History: Positive for: Hypertension Gastrointestinal Complaints and Symptoms: No Complaints or Symptoms Endocrine Medical History: Positive for: Type II Diabetes Time with diabetes: 20 years Treated with: Insulin Blood sugar tested every day: Yes Tested : 3x a day Genitourinary Complaints and Symptoms: Review of System Notes: CKD stage III self caths self neurogenic  bladder recurrent UTIs Immunological Complaints and Symptoms: No Complaints or Symptoms Neurologic Complaints and Symptoms: Review of System Notes: MS neuralgia neuritis Medical History: Positive for: Paraplegia - d/t MS Oncologic MACAI, MULROY ([id number]) Complaints and Symptoms: No Complaints or Symptoms Psychiatric Complaints and Symptoms: No Complaints or Symptoms Family and Social History Cancer: No; Diabetes: Yes - Father; Heart Disease: No; Hereditary Spherocytosis: No; Hypertension: No; Kidney Disease: No; Lung Disease: No; Seizures: No; Stroke: Yes - Father, Paternal Grandparents; Thyroid Problems: No; Tuberculosis: No; Former smoker - quit 30 years ago, cigar; Marital Status - Married; Alcohol Use: Daily - glass of wine a day; Drug Use: No History; Caffeine Use: Never; Financial Concerns: No; Food, Clothing or Shelter Needs: No; Support System Lacking: No; Transportation Concerns: No; Advanced Directives: No; Patient does not want information on Advanced Directives; Do not resuscitate: No; Living Will: Yes (Not Provided); Medical Power of Attorney: Yes - Felicita Gage (Not Provided) Electronic Signature(s) Signed: 12/31/2015 4:22:14 PM By: Alric Quan Signed: 01/01/2016 4:32:31 PM By: Linton Ham MD Entered By: Alric Quan on 12/31/2015 10:02:30 Lance [name] ([id number]) -------------------------------------------------------------------------------- SuperBill Details Lance [name] Date of Service: 12/31/2015 Patient Name: J. Patient Account Number: [id number] Medical Record Treating RN: Ahmed Prima RI:[id number] Number: Other Clinician: Date of Birth/Sex: 05/29/1940 (74 y.o. Male) Treating [name], [name] Primary Care Physician/Extender: Eulah Citizen Physician: Suella Grove in Treatment: 0 Referring Physician: Lavone Orn Diagnosis Coding ICD-10 Codes Code Description [id number] Pressure ulcer of right buttock, stage 2 G35  Multiple sclerosis Facility Procedures CPT4 Code: [id number] Description: 99213 - WOUND CARE VISIT-LEV 3 EST PT  Modifier: Quantity: 1 CPT4 Code: [id number] Description: [id number] - DEB SUBQ TISSUE 20 SQ CM/< ICD-10 Description Diagnosis L89.312 Pressure ulcer of right buttock, stage 2 Modifier: Quantity: 1 Physician Procedures CPT4 Code: [id number] Description: [id number] - WC PHYS LEVEL 4 - EST PT ICD-10 Description Diagnosis L89.312 Pressure ulcer of right buttock, stage 2 Modifier: Quantity: 1 CPT4 Code: [id number] Description: [id number] - WC PHYS SUBQ TISS 20 SQ CM ICD-10 Description Diagnosis L89.312 Pressure ulcer of right buttock, stage 2 Modifier: Quantity: 1 Electronic Signature(s) Signed: 12/31/2015 4:22:14 PM By: Alric Quan Signed: 01/01/2016 4:32:31 PM By: Linton Ham MD Entered By: Alric Quan on 12/31/2015 15:36:44

## 2016-01-06 DIAGNOSIS — L89622 Pressure ulcer of left heel, stage 2: Secondary | ICD-10-CM | POA: Diagnosis not present

## 2016-01-06 DIAGNOSIS — G35 Multiple sclerosis: Secondary | ICD-10-CM | POA: Diagnosis not present

## 2016-01-06 DIAGNOSIS — I129 Hypertensive chronic kidney disease with stage 1 through stage 4 chronic kidney disease, or unspecified chronic kidney disease: Secondary | ICD-10-CM | POA: Diagnosis not present

## 2016-01-06 DIAGNOSIS — N183 Chronic kidney disease, stage 3 (moderate): Secondary | ICD-10-CM | POA: Diagnosis not present

## 2016-01-06 DIAGNOSIS — G8250 Quadriplegia, unspecified: Secondary | ICD-10-CM | POA: Diagnosis not present

## 2016-01-06 DIAGNOSIS — E1122 Type 2 diabetes mellitus with diabetic chronic kidney disease: Secondary | ICD-10-CM | POA: Diagnosis not present

## 2016-01-09 DIAGNOSIS — E785 Hyperlipidemia, unspecified: Secondary | ICD-10-CM | POA: Diagnosis not present

## 2016-01-09 DIAGNOSIS — Z794 Long term (current) use of insulin: Secondary | ICD-10-CM | POA: Diagnosis not present

## 2016-01-09 DIAGNOSIS — L89310 Pressure ulcer of right buttock, unstageable: Secondary | ICD-10-CM | POA: Diagnosis not present

## 2016-01-09 DIAGNOSIS — G8250 Quadriplegia, unspecified: Secondary | ICD-10-CM | POA: Diagnosis not present

## 2016-01-09 DIAGNOSIS — I129 Hypertensive chronic kidney disease with stage 1 through stage 4 chronic kidney disease, or unspecified chronic kidney disease: Secondary | ICD-10-CM | POA: Diagnosis not present

## 2016-01-09 DIAGNOSIS — Z7982 Long term (current) use of aspirin: Secondary | ICD-10-CM | POA: Diagnosis not present

## 2016-01-09 DIAGNOSIS — Z87440 Personal history of urinary (tract) infections: Secondary | ICD-10-CM | POA: Diagnosis not present

## 2016-01-09 DIAGNOSIS — L89622 Pressure ulcer of left heel, stage 2: Secondary | ICD-10-CM | POA: Diagnosis not present

## 2016-01-09 DIAGNOSIS — E1122 Type 2 diabetes mellitus with diabetic chronic kidney disease: Secondary | ICD-10-CM | POA: Diagnosis not present

## 2016-01-09 DIAGNOSIS — D631 Anemia in chronic kidney disease: Secondary | ICD-10-CM | POA: Diagnosis not present

## 2016-01-09 DIAGNOSIS — G35 Multiple sclerosis: Secondary | ICD-10-CM | POA: Diagnosis not present

## 2016-01-09 DIAGNOSIS — N183 Chronic kidney disease, stage 3 (moderate): Secondary | ICD-10-CM | POA: Diagnosis not present

## 2016-01-10 DIAGNOSIS — L89310 Pressure ulcer of right buttock, unstageable: Secondary | ICD-10-CM | POA: Diagnosis not present

## 2016-01-10 DIAGNOSIS — I129 Hypertensive chronic kidney disease with stage 1 through stage 4 chronic kidney disease, or unspecified chronic kidney disease: Secondary | ICD-10-CM | POA: Diagnosis not present

## 2016-01-10 DIAGNOSIS — L89622 Pressure ulcer of left heel, stage 2: Secondary | ICD-10-CM | POA: Diagnosis not present

## 2016-01-10 DIAGNOSIS — G35 Multiple sclerosis: Secondary | ICD-10-CM | POA: Diagnosis not present

## 2016-01-10 DIAGNOSIS — E1122 Type 2 diabetes mellitus with diabetic chronic kidney disease: Secondary | ICD-10-CM | POA: Diagnosis not present

## 2016-01-10 DIAGNOSIS — G8250 Quadriplegia, unspecified: Secondary | ICD-10-CM | POA: Diagnosis not present

## 2016-01-13 DIAGNOSIS — E1122 Type 2 diabetes mellitus with diabetic chronic kidney disease: Secondary | ICD-10-CM | POA: Diagnosis not present

## 2016-01-13 DIAGNOSIS — G35 Multiple sclerosis: Secondary | ICD-10-CM | POA: Diagnosis not present

## 2016-01-13 DIAGNOSIS — I129 Hypertensive chronic kidney disease with stage 1 through stage 4 chronic kidney disease, or unspecified chronic kidney disease: Secondary | ICD-10-CM | POA: Diagnosis not present

## 2016-01-13 DIAGNOSIS — L89622 Pressure ulcer of left heel, stage 2: Secondary | ICD-10-CM | POA: Diagnosis not present

## 2016-01-13 DIAGNOSIS — G8250 Quadriplegia, unspecified: Secondary | ICD-10-CM | POA: Diagnosis not present

## 2016-01-13 DIAGNOSIS — L89310 Pressure ulcer of right buttock, unstageable: Secondary | ICD-10-CM | POA: Diagnosis not present

## 2016-01-14 ENCOUNTER — Ambulatory Visit: Payer: Medicare Other | Admitting: Internal Medicine

## 2016-01-15 DIAGNOSIS — L89622 Pressure ulcer of left heel, stage 2: Secondary | ICD-10-CM | POA: Diagnosis not present

## 2016-01-15 DIAGNOSIS — E1122 Type 2 diabetes mellitus with diabetic chronic kidney disease: Secondary | ICD-10-CM | POA: Diagnosis not present

## 2016-01-15 DIAGNOSIS — I129 Hypertensive chronic kidney disease with stage 1 through stage 4 chronic kidney disease, or unspecified chronic kidney disease: Secondary | ICD-10-CM | POA: Diagnosis not present

## 2016-01-15 DIAGNOSIS — G8250 Quadriplegia, unspecified: Secondary | ICD-10-CM | POA: Diagnosis not present

## 2016-01-15 DIAGNOSIS — L89310 Pressure ulcer of right buttock, unstageable: Secondary | ICD-10-CM | POA: Diagnosis not present

## 2016-01-15 DIAGNOSIS — G35 Multiple sclerosis: Secondary | ICD-10-CM | POA: Diagnosis not present

## 2016-01-16 ENCOUNTER — Encounter (HOSPITAL_BASED_OUTPATIENT_CLINIC_OR_DEPARTMENT_OTHER): Payer: Medicare Other | Attending: Internal Medicine

## 2016-01-16 DIAGNOSIS — G8220 Paraplegia, unspecified: Secondary | ICD-10-CM | POA: Diagnosis not present

## 2016-01-16 DIAGNOSIS — Z794 Long term (current) use of insulin: Secondary | ICD-10-CM | POA: Diagnosis not present

## 2016-01-16 DIAGNOSIS — L89312 Pressure ulcer of right buttock, stage 2: Secondary | ICD-10-CM | POA: Insufficient documentation

## 2016-01-16 DIAGNOSIS — E1122 Type 2 diabetes mellitus with diabetic chronic kidney disease: Secondary | ICD-10-CM | POA: Insufficient documentation

## 2016-01-16 DIAGNOSIS — I129 Hypertensive chronic kidney disease with stage 1 through stage 4 chronic kidney disease, or unspecified chronic kidney disease: Secondary | ICD-10-CM | POA: Diagnosis not present

## 2016-01-16 DIAGNOSIS — L89313 Pressure ulcer of right buttock, stage 3: Secondary | ICD-10-CM | POA: Diagnosis not present

## 2016-01-16 DIAGNOSIS — G35 Multiple sclerosis: Secondary | ICD-10-CM | POA: Insufficient documentation

## 2016-01-16 DIAGNOSIS — N183 Chronic kidney disease, stage 3 (moderate): Secondary | ICD-10-CM | POA: Diagnosis not present

## 2016-01-20 DIAGNOSIS — I129 Hypertensive chronic kidney disease with stage 1 through stage 4 chronic kidney disease, or unspecified chronic kidney disease: Secondary | ICD-10-CM | POA: Diagnosis not present

## 2016-01-20 DIAGNOSIS — L89310 Pressure ulcer of right buttock, unstageable: Secondary | ICD-10-CM | POA: Diagnosis not present

## 2016-01-20 DIAGNOSIS — G8250 Quadriplegia, unspecified: Secondary | ICD-10-CM | POA: Diagnosis not present

## 2016-01-20 DIAGNOSIS — G35 Multiple sclerosis: Secondary | ICD-10-CM | POA: Diagnosis not present

## 2016-01-20 DIAGNOSIS — E1122 Type 2 diabetes mellitus with diabetic chronic kidney disease: Secondary | ICD-10-CM | POA: Diagnosis not present

## 2016-01-20 DIAGNOSIS — L89622 Pressure ulcer of left heel, stage 2: Secondary | ICD-10-CM | POA: Diagnosis not present

## 2016-01-22 DIAGNOSIS — L89310 Pressure ulcer of right buttock, unstageable: Secondary | ICD-10-CM | POA: Diagnosis not present

## 2016-01-22 DIAGNOSIS — E1122 Type 2 diabetes mellitus with diabetic chronic kidney disease: Secondary | ICD-10-CM | POA: Diagnosis not present

## 2016-01-22 DIAGNOSIS — G35 Multiple sclerosis: Secondary | ICD-10-CM | POA: Diagnosis not present

## 2016-01-22 DIAGNOSIS — I129 Hypertensive chronic kidney disease with stage 1 through stage 4 chronic kidney disease, or unspecified chronic kidney disease: Secondary | ICD-10-CM | POA: Diagnosis not present

## 2016-01-22 DIAGNOSIS — L89622 Pressure ulcer of left heel, stage 2: Secondary | ICD-10-CM | POA: Diagnosis not present

## 2016-01-22 DIAGNOSIS — G8250 Quadriplegia, unspecified: Secondary | ICD-10-CM | POA: Diagnosis not present

## 2016-01-27 DIAGNOSIS — G35 Multiple sclerosis: Secondary | ICD-10-CM | POA: Diagnosis not present

## 2016-01-27 DIAGNOSIS — L89622 Pressure ulcer of left heel, stage 2: Secondary | ICD-10-CM | POA: Diagnosis not present

## 2016-01-27 DIAGNOSIS — L89310 Pressure ulcer of right buttock, unstageable: Secondary | ICD-10-CM | POA: Diagnosis not present

## 2016-01-27 DIAGNOSIS — E1122 Type 2 diabetes mellitus with diabetic chronic kidney disease: Secondary | ICD-10-CM | POA: Diagnosis not present

## 2016-01-27 DIAGNOSIS — G8250 Quadriplegia, unspecified: Secondary | ICD-10-CM | POA: Diagnosis not present

## 2016-01-27 DIAGNOSIS — I129 Hypertensive chronic kidney disease with stage 1 through stage 4 chronic kidney disease, or unspecified chronic kidney disease: Secondary | ICD-10-CM | POA: Diagnosis not present

## 2016-01-30 ENCOUNTER — Inpatient Hospital Stay (HOSPITAL_COMMUNITY)
Admission: EM | Admit: 2016-01-30 | Discharge: 2016-02-05 | DRG: 593 | Disposition: A | Discharge status: Skilled Nursing Facility | Payer: Medicare Other | Attending: Internal Medicine | Admitting: Internal Medicine

## 2016-01-30 ENCOUNTER — Inpatient Hospital Stay (HOSPITAL_COMMUNITY): Payer: Medicare Other

## 2016-01-30 ENCOUNTER — Encounter (HOSPITAL_COMMUNITY): Payer: Self-pay | Admitting: Emergency Medicine

## 2016-01-30 ENCOUNTER — Other Ambulatory Visit: Payer: Self-pay

## 2016-01-30 DIAGNOSIS — D631 Anemia in chronic kidney disease: Secondary | ICD-10-CM | POA: Diagnosis present

## 2016-01-30 DIAGNOSIS — B961 Klebsiella pneumoniae [K. pneumoniae] as the cause of diseases classified elsewhere: Secondary | ICD-10-CM | POA: Diagnosis present

## 2016-01-30 DIAGNOSIS — G35 Multiple sclerosis: Secondary | ICD-10-CM | POA: Diagnosis present

## 2016-01-30 DIAGNOSIS — R531 Weakness: Secondary | ICD-10-CM | POA: Diagnosis not present

## 2016-01-30 DIAGNOSIS — G8220 Paraplegia, unspecified: Secondary | ICD-10-CM | POA: Diagnosis not present

## 2016-01-30 DIAGNOSIS — B954 Other streptococcus as the cause of diseases classified elsewhere: Secondary | ICD-10-CM | POA: Diagnosis present

## 2016-01-30 DIAGNOSIS — Z993 Dependence on wheelchair: Secondary | ICD-10-CM | POA: Diagnosis not present

## 2016-01-30 DIAGNOSIS — N319 Neuromuscular dysfunction of bladder, unspecified: Secondary | ICD-10-CM | POA: Diagnosis not present

## 2016-01-30 DIAGNOSIS — N390 Urinary tract infection, site not specified: Secondary | ICD-10-CM | POA: Diagnosis present

## 2016-01-30 DIAGNOSIS — E1129 Type 2 diabetes mellitus with other diabetic kidney complication: Secondary | ICD-10-CM | POA: Diagnosis present

## 2016-01-30 DIAGNOSIS — B9562 Methicillin resistant Staphylococcus aureus infection as the cause of diseases classified elsewhere: Secondary | ICD-10-CM | POA: Diagnosis present

## 2016-01-30 DIAGNOSIS — Z7982 Long term (current) use of aspirin: Secondary | ICD-10-CM | POA: Diagnosis not present

## 2016-01-30 DIAGNOSIS — I129 Hypertensive chronic kidney disease with stage 1 through stage 4 chronic kidney disease, or unspecified chronic kidney disease: Secondary | ICD-10-CM | POA: Diagnosis present

## 2016-01-30 DIAGNOSIS — N179 Acute kidney failure, unspecified: Secondary | ICD-10-CM | POA: Diagnosis present

## 2016-01-30 DIAGNOSIS — B999 Unspecified infectious disease: Secondary | ICD-10-CM | POA: Diagnosis not present

## 2016-01-30 DIAGNOSIS — Z87891 Personal history of nicotine dependence: Secondary | ICD-10-CM | POA: Diagnosis not present

## 2016-01-30 DIAGNOSIS — L03317 Cellulitis of buttock: Secondary | ICD-10-CM

## 2016-01-30 DIAGNOSIS — E785 Hyperlipidemia, unspecified: Secondary | ICD-10-CM | POA: Diagnosis present

## 2016-01-30 DIAGNOSIS — L89153 Pressure ulcer of sacral region, stage 3: Secondary | ICD-10-CM

## 2016-01-30 DIAGNOSIS — N183 Chronic kidney disease, stage 3 unspecified: Secondary | ICD-10-CM | POA: Diagnosis present

## 2016-01-30 DIAGNOSIS — L89310 Pressure ulcer of right buttock, unstageable: Principal | ICD-10-CM | POA: Diagnosis present

## 2016-01-30 DIAGNOSIS — B967 Clostridium perfringens [C. perfringens] as the cause of diseases classified elsewhere: Secondary | ICD-10-CM | POA: Diagnosis not present

## 2016-01-30 DIAGNOSIS — L89159 Pressure ulcer of sacral region, unspecified stage: Secondary | ICD-10-CM | POA: Diagnosis present

## 2016-01-30 DIAGNOSIS — M6281 Muscle weakness (generalized): Secondary | ICD-10-CM | POA: Diagnosis not present

## 2016-01-30 DIAGNOSIS — T148XXA Other injury of unspecified body region, initial encounter: Secondary | ICD-10-CM

## 2016-01-30 DIAGNOSIS — E1122 Type 2 diabetes mellitus with diabetic chronic kidney disease: Secondary | ICD-10-CM | POA: Diagnosis present

## 2016-01-30 DIAGNOSIS — Z87440 Personal history of urinary (tract) infections: Secondary | ICD-10-CM | POA: Diagnosis not present

## 2016-01-30 DIAGNOSIS — Z792 Long term (current) use of antibiotics: Secondary | ICD-10-CM | POA: Diagnosis not present

## 2016-01-30 DIAGNOSIS — I10 Essential (primary) hypertension: Secondary | ICD-10-CM | POA: Diagnosis present

## 2016-01-30 DIAGNOSIS — L89319 Pressure ulcer of right buttock, unspecified stage: Secondary | ICD-10-CM | POA: Diagnosis not present

## 2016-01-30 DIAGNOSIS — L089 Local infection of the skin and subcutaneous tissue, unspecified: Secondary | ICD-10-CM

## 2016-01-30 DIAGNOSIS — L8990 Pressure ulcer of unspecified site, unspecified stage: Secondary | ICD-10-CM | POA: Diagnosis not present

## 2016-01-30 DIAGNOSIS — E1159 Type 2 diabetes mellitus with other circulatory complications: Secondary | ICD-10-CM | POA: Diagnosis present

## 2016-01-30 DIAGNOSIS — L0390 Cellulitis, unspecified: Secondary | ICD-10-CM | POA: Diagnosis present

## 2016-01-30 DIAGNOSIS — Z466 Encounter for fitting and adjustment of urinary device: Secondary | ICD-10-CM | POA: Diagnosis not present

## 2016-01-30 DIAGNOSIS — G8250 Quadriplegia, unspecified: Secondary | ICD-10-CM | POA: Diagnosis present

## 2016-01-30 DIAGNOSIS — D649 Anemia, unspecified: Secondary | ICD-10-CM | POA: Diagnosis not present

## 2016-01-30 DIAGNOSIS — M792 Neuralgia and neuritis, unspecified: Secondary | ICD-10-CM | POA: Diagnosis not present

## 2016-01-30 DIAGNOSIS — Z833 Family history of diabetes mellitus: Secondary | ICD-10-CM | POA: Diagnosis not present

## 2016-01-30 DIAGNOSIS — T148 Other injury of unspecified body region: Secondary | ICD-10-CM

## 2016-01-30 DIAGNOSIS — Z794 Long term (current) use of insulin: Secondary | ICD-10-CM | POA: Diagnosis not present

## 2016-01-30 DIAGNOSIS — L89313 Pressure ulcer of right buttock, stage 3: Secondary | ICD-10-CM | POA: Diagnosis not present

## 2016-01-30 DIAGNOSIS — E11622 Type 2 diabetes mellitus with other skin ulcer: Secondary | ICD-10-CM | POA: Diagnosis not present

## 2016-01-30 LAB — COMPREHENSIVE METABOLIC PANEL
ALK PHOS: 85 U/L (ref 38–126)
ALT: 35 U/L (ref 17–63)
ANION GAP: 9 (ref 5–15)
AST: 36 U/L (ref 15–41)
Albumin: 2.8 g/dL — ABNORMAL LOW (ref 3.5–5.0)
BUN: 42 mg/dL — ABNORMAL HIGH (ref 6–20)
CALCIUM: 8.7 mg/dL — AB (ref 8.9–10.3)
CO2: 21 mmol/L — AB (ref 22–32)
Chloride: 106 mmol/L (ref 101–111)
Creatinine, Ser: 1.7 mg/dL — ABNORMAL HIGH (ref 0.61–1.24)
GFR, EST AFRICAN AMERICAN: 44 mL/min — AB (ref >60)
GFR, EST NON AFRICAN AMERICAN: 38 mL/min — AB (ref >60)
Glucose, Bld: 156 mg/dL — ABNORMAL HIGH (ref 65–99)
Potassium: 4.8 mmol/L (ref 3.5–5.1)
SODIUM: 136 mmol/L (ref 135–145)
Total Bilirubin: 0.7 mg/dL (ref 0.3–1.2)
Total Protein: 7.8 g/dL (ref 6.5–8.1)

## 2016-01-30 LAB — CBC
HCT: 27.3 % — ABNORMAL LOW (ref 39.0–52.0)
HEMOGLOBIN: 8.8 g/dL — AB (ref 13.0–17.0)
MCH: 29.9 pg (ref 26.0–34.0)
MCHC: 32.2 g/dL (ref 30.0–36.0)
MCV: 92.9 fL (ref 78.0–100.0)
Platelets: 335 10*3/uL (ref 150–400)
RBC: 2.94 MIL/uL — AB (ref 4.22–5.81)
RDW: 15 % (ref 11.5–15.5)
WBC: 12.2 10*3/uL — AB (ref 4.0–10.5)

## 2016-01-30 LAB — URINALYSIS, ROUTINE W REFLEX MICROSCOPIC
Bilirubin Urine: NEGATIVE
Glucose, UA: NEGATIVE mg/dL
Ketones, ur: NEGATIVE mg/dL
Nitrite: NEGATIVE
PROTEIN: 100 mg/dL — AB
Specific Gravity, Urine: 1.017 (ref 1.005–1.030)
pH: 5.5 (ref 5.0–8.0)

## 2016-01-30 LAB — GLUCOSE, CAPILLARY: Glucose-Capillary: 173 mg/dL — ABNORMAL HIGH (ref 65–99)

## 2016-01-30 LAB — URINE MICROSCOPIC-ADD ON

## 2016-01-30 LAB — I-STAT CG4 LACTIC ACID, ED: LACTIC ACID, VENOUS: 0.96 mmol/L (ref 0.5–1.9)

## 2016-01-30 LAB — LIPASE, BLOOD: Lipase: 18 U/L (ref 11–51)

## 2016-01-30 LAB — SEDIMENTATION RATE: SED RATE: 138 mm/h — AB (ref 0–16)

## 2016-01-30 MED ORDER — OXYBUTYNIN CHLORIDE 5 MG PO TABS
5.0000 mg | ORAL_TABLET | Freq: Two times a day (BID) | ORAL | Status: DC
  Administered 2016-01-30 – 2016-02-05 (×12): 5 mg via ORAL
  Filled 2016-01-30 (×12): qty 1

## 2016-01-30 MED ORDER — SODIUM CHLORIDE 0.9 % IV SOLN
1250.0000 mg | Freq: Once | INTRAVENOUS | Status: AC
  Administered 2016-01-30: 1250 mg via INTRAVENOUS
  Filled 2016-01-30: qty 1250

## 2016-01-30 MED ORDER — ADULT MULTIVITAMIN W/MINERALS CH
1.0000 | ORAL_TABLET | Freq: Every day | ORAL | Status: DC
  Administered 2016-01-31 – 2016-02-05 (×6): 1 via ORAL
  Filled 2016-01-30 (×6): qty 1

## 2016-01-30 MED ORDER — SIMVASTATIN 20 MG PO TABS
20.0000 mg | ORAL_TABLET | Freq: Every evening | ORAL | Status: DC
  Administered 2016-01-31 – 2016-02-04 (×5): 20 mg via ORAL
  Filled 2016-01-30 (×5): qty 1

## 2016-01-30 MED ORDER — SODIUM CHLORIDE 0.9 % IV SOLN
INTRAVENOUS | Status: AC
  Administered 2016-01-31: 01:00:00 via INTRAVENOUS

## 2016-01-30 MED ORDER — INSULIN DETEMIR 100 UNIT/ML ~~LOC~~ SOLN
18.0000 [IU] | Freq: Every day | SUBCUTANEOUS | Status: DC
  Administered 2016-01-30 – 2016-02-04 (×6): 18 [IU] via SUBCUTANEOUS
  Filled 2016-01-30 (×7): qty 0.18

## 2016-01-30 MED ORDER — PIPERACILLIN-TAZOBACTAM 3.375 G IVPB
3.3750 g | Freq: Three times a day (TID) | INTRAVENOUS | Status: DC
  Administered 2016-01-31 – 2016-02-05 (×17): 3.375 g via INTRAVENOUS
  Filled 2016-01-30 (×16): qty 50

## 2016-01-30 MED ORDER — VITAMIN D 1000 UNITS PO TABS
2000.0000 [IU] | ORAL_TABLET | Freq: Every day | ORAL | Status: DC
  Administered 2016-01-31 – 2016-02-05 (×6): 2000 [IU] via ORAL
  Filled 2016-01-30 (×6): qty 2

## 2016-01-30 MED ORDER — INSULIN ASPART 100 UNIT/ML ~~LOC~~ SOLN
3.0000 [IU] | Freq: Three times a day (TID) | SUBCUTANEOUS | Status: DC
  Administered 2016-01-31 – 2016-02-05 (×18): 3 [IU] via SUBCUTANEOUS

## 2016-01-30 MED ORDER — INSULIN ASPART 100 UNIT/ML ~~LOC~~ SOLN
0.0000 [IU] | Freq: Three times a day (TID) | SUBCUTANEOUS | Status: DC
  Administered 2016-01-31: 1 [IU] via SUBCUTANEOUS
  Administered 2016-01-31: 2 [IU] via SUBCUTANEOUS
  Administered 2016-02-01 – 2016-02-02 (×5): 1 [IU] via SUBCUTANEOUS
  Administered 2016-02-03: 3 [IU] via SUBCUTANEOUS
  Administered 2016-02-04: 1 [IU] via SUBCUTANEOUS
  Administered 2016-02-04: 2 [IU] via SUBCUTANEOUS
  Administered 2016-02-05: 1 [IU] via SUBCUTANEOUS

## 2016-01-30 MED ORDER — PIPERACILLIN-TAZOBACTAM 3.375 G IVPB 30 MIN
3.3750 g | Freq: Once | INTRAVENOUS | Status: AC
  Administered 2016-01-30: 3.375 g via INTRAVENOUS
  Filled 2016-01-30: qty 50

## 2016-01-30 MED ORDER — INSULIN ASPART 100 UNIT/ML ~~LOC~~ SOLN: 0.0000 [IU] | Freq: Every day | SUBCUTANEOUS | Status: DC

## 2016-01-30 MED ORDER — ACETAMINOPHEN 650 MG RE SUPP: 650.0000 mg | Freq: Four times a day (QID) | RECTAL | Status: DC | PRN

## 2016-01-30 MED ORDER — ACETAMINOPHEN 325 MG PO TABS
650.0000 mg | ORAL_TABLET | Freq: Four times a day (QID) | ORAL | Status: DC | PRN
  Administered 2016-01-30 – 2016-02-02 (×3): 650 mg via ORAL
  Filled 2016-01-30 (×3): qty 2

## 2016-01-30 MED ORDER — HYDROCODONE-ACETAMINOPHEN 5-325 MG PO TABS
1.0000 | ORAL_TABLET | ORAL | Status: DC | PRN
  Administered 2016-02-03: 2 via ORAL
  Filled 2016-01-30 (×2): qty 2

## 2016-01-30 MED ORDER — VANCOMYCIN HCL 10 G IV SOLR
1250.0000 mg | INTRAVENOUS | Status: DC
  Administered 2016-01-31 – 2016-02-01 (×2): 1250 mg via INTRAVENOUS
  Filled 2016-01-30 (×3): qty 1250

## 2016-01-30 MED ORDER — BISACODYL 10 MG RE SUPP: 10.0000 mg | Freq: Every day | RECTAL | Status: DC | PRN

## 2016-01-30 MED ORDER — SODIUM CHLORIDE 0.9 % IV BOLUS (SEPSIS)
1000.0000 mL | Freq: Once | INTRAVENOUS | Status: AC
  Administered 2016-01-30: 1000 mL via INTRAVENOUS

## 2016-01-30 MED ORDER — HEPARIN SODIUM (PORCINE) 5000 UNIT/ML IJ SOLN
5000.0000 [IU] | Freq: Three times a day (TID) | INTRAMUSCULAR | Status: DC
  Administered 2016-01-30 – 2016-02-05 (×17): 5000 [IU] via SUBCUTANEOUS
  Filled 2016-01-30 (×18): qty 1

## 2016-01-30 MED ORDER — ONDANSETRON HCL 4 MG PO TABS: 4.0000 mg | ORAL_TABLET | Freq: Four times a day (QID) | ORAL | Status: DC | PRN

## 2016-01-30 MED ORDER — ONDANSETRON HCL 4 MG/2ML IJ SOLN: 4.0000 mg | Freq: Four times a day (QID) | INTRAMUSCULAR | Status: DC | PRN

## 2016-01-30 NOTE — ED Notes (Signed)
In XRAY

## 2016-01-30 NOTE — H&P (Signed)
History and Physical    PHUOC HUY MHD:[id number] [name]: 1941-04-26 DOA: 01/30/2016  PCP: Pcp Not In System   Patient coming from: Home, by way of wound care clinic   Chief Complaint: Infected right ischial wound   HPI: Lance [name] is a 75 y.o. male with medical history significant for multiple sclerosis with spastic paraplegia, chronic kidney disease stage III, insulin-dependent diabetes mellitus, chronic Foley catheter with recurrent UTI, and initial pressure was around who presents to the emergency department at the direction of his wound care physician for evaluation of right ischial pressure ulcer with suspected secondary bacterial infection and concern for underlying osteomyelitis. Patient is wheelchair bound secondary to his MS and associated paraplegia, and has history of left ischial pressure wound which became infected with underlying osteomyelitis and is status post surgical debridement and skin flap. Patient has more recently developed a wound over the right ischium and has been following with wound care. Patient reports foul drainage and purulent discharge from the wound for the past 2 weeks. He also notes body aches and chills for the past 3 days. He denies any chest pain, palpitations, dyspnea, or cough. He had an episode of diarrhea yesterday, but that has seemed to resolve. Patient takes daily trimethoprim for UTI suppression, but is otherwise not been on antibiotics for the wound.  ED Course: Upon arrival to the ED, patient is found to have temp of 37.8 C, be saturating well on room air, and with vitals otherwise stable. EKG demonstrates a sinus rhythm with early R-transition. Chemistry panel features a serum creatinine 1.70, up from an apparent baseline of ~1.4. CBC is notable for a stable normocytic anemia with hemoglobin of 8.8 and a leukocytosis to 12,200. Lactic acid is reassuring at 0.96. Urinalysis is suggestive of infection and urine is sent for culture. Blood  cultures were also obtained and the patient was given a 1 L bolus of normal saline. Empiric treatment with vancomycin and Zosyn was initiated in the emergency department. Patient remained hemodynamically stable and in no respiratory distress. He will be admitted to the medical surgical unit for ongoing evaluation and management of right facial pressure wound with secondary bacterial infection and concern for underlying osteomyelitis.  Review of Systems:  All other systems reviewed and apart from HPI, are negative.  Past Medical History:  Diagnosis Date  . Anemia in chronic renal disease   . Blood transfusion   . Blood transfusion without reported diagnosis   . Chronic spastic paraplegia (HCC)    secondary to MS  . CKD (chronic kidney disease), stage III   . Decreased sensation of lower extremity    due to MS  . Decubitus ulcer of ischium   . History of MRSA infection    urine  . History of recurrent UTIs   . Hypertension   . MS (multiple sclerosis) ([place])   . Neuralgia neuritis, sciatic nerve    MS for 30 years  . Neurogenic bladder   . PONV (postoperative nausea and vomiting)   . Self-catheterizes urinary bladder   . Type 2 diabetes mellitus ([place])     Past Surgical History:  Procedure Laterality Date  . APPLICATION OF A-CELL OF EXTREMITY Left 09/05/2014   Procedure: PLACEMENT OF ACELL AND VAC;  Surgeon: Theodoro Kos, DO;  Location: [facility];  Service: Plastics;  Laterality: Left;  . EXTRACORPOREAL SHOCK WAVE LITHOTRIPSY  03-23-2011  . FINGER SURGERY  2004  . INCISION AND DRAINAGE OF WOUND Left 09/05/2014   Procedure:  IRRIGATION AND DEBRIDEMENT OF LEFT ISCHIUM WOUND WITH ;  Surgeon: Theodoro Kos, DO;  Location: [facility];  Service: Plastics;  Laterality: Left;  . TONSILLECTOMY    . WOUND DEBRIDEMENT       reports that he quit smoking about 32 years ago. His smoking use included Cigars. He has never used smokeless tobacco. He reports that he drinks about 1.8 oz of alcohol per week . He  reports that he does not use drugs.  No Known Allergies  Family History  Problem Relation Age of Onset  . Stroke Father   . Diabetes Father      Prior to Admission medications   Medication Sig Start Date End Date Taking? Authorizing Provider  Ascorbic Acid (VITAMIN C PO) Take 1 tablet by mouth daily.    Yes Historical Provider, MD  aspirin 81 MG tablet Take 1 tablet (81 mg total) by mouth daily. 03/30/11  Yes Rolan Bucco, MD  Cholecalciferol (VITAMIN D3) 2000 UNITS TABS Take 2,000 mg by mouth daily.     Yes Historical Provider, MD  insulin aspart (NOVOLOG FLEXPEN) 100 UNIT/ML FlexPen Inject 8 Units into the skin 3 (three) times daily with meals. 09/07/14  Yes Domenic Polite, MD  LEVEMIR FLEXTOUCH 100 UNIT/ML Pen Inject 18 Units into the skin daily.  01/28/16  Yes Historical Provider, MD  Multiple Vitamin (MULTIVITAMIN WITH MINERALS) TABS tablet Take 1 tablet by mouth daily.   Yes Historical Provider, MD  oxybutynin (DITROPAN) 5 MG tablet Take 5 mg by mouth 2 (two) times daily.    Yes Historical Provider, MD  simvastatin (ZOCOR) 20 MG tablet Take 20 mg by mouth every evening.   Yes Historical Provider, MD  trimethoprim (TRIMPEX) 100 MG tablet Take 100 mg by mouth daily. 06/19/14  Yes Historical Provider, MD  bisacodyl (DULCOLAX) 10 MG suppository Place 1 suppository (10 mg total) rectally daily as needed for moderate constipation. 08/06/13   Josetta Huddle, MD  fish oil-omega-3 fatty acids 1000 MG capsule Take 1 capsule (1 g total) by mouth daily. Patient not taking: Reported on 01/30/2016 03/30/11   Rolan Bucco, MD    Physical Exam: Vitals:   01/30/16 1645 01/30/16 1700 01/30/16 1750 01/30/16 1830  BP:  153/59 145/63 110/65  Pulse: 91  78 85  Resp: 13 10 18 12   Temp:      TempSrc:      SpO2: 97%  95% 98%  Weight:      Height:          Constitutional: NAD, calm, comfortable Eyes: PERTLA, lids and conjunctivae normal ENMT: Mucous membranes are moist. Posterior pharynx clear  of any exudate or lesions.   Neck: normal, supple, no masses, no thyromegaly Respiratory: clear to auscultation bilaterally, no wheezing, no crackles. Coarse upper airways noise. Normal respiratory effort.   Cardiovascular: S1 & S2 heard, regular rate and rhythm. No extremity edema. No significant JVD. Abdomen: No distension, no tenderness, no masses palpated. Bowel sounds normal.  Musculoskeletal: no clubbing / cyanosis. Atrophy and contractures involving b/l LE's.     Skin: Deep ulcer overlies right ischium and extends medially with significant surrounding erythema, induration, and heat. Foul odor and purulence noted. Skin otherwise warm, dry, well-perfused. Neurologic: CN 2-12 grossly intact. Strength 5/5 in b/l UEs. PERRL, EOMI.  Psychiatric: Normal judgment and insight. Alert and oriented x 3. Normal mood and affect.     Labs on Admission: I have personally reviewed following labs and imaging studies  CBC:  Recent Labs Lab 01/30/16  1305  WBC 12.2*  HGB 8.8*  HCT 27.3*  MCV 92.9  PLT 468   Basic Metabolic Panel:  Recent Labs Lab 01/30/16 1305  NA 136  K 4.8  CL 106  CO2 21*  GLUCOSE 156*  BUN 42*  CREATININE 1.70*  CALCIUM 8.7*   GFR: Estimated Creatinine Clearance: 44 mL/min (by C-G formula based on SCr of 1.7 mg/dL (H)). Liver Function Tests:  Recent Labs Lab 01/30/16 1305  AST 36  ALT 35  ALKPHOS 85  BILITOT 0.7  PROT 7.8  ALBUMIN 2.8*    Recent Labs Lab 01/30/16 1305  LIPASE 18   No results for input(s): AMMONIA in the last 168 hours. Coagulation Profile: No results for input(s): INR, PROTIME in the last 168 hours. Cardiac Enzymes: No results for input(s): CKTOTAL, CKMB, CKMBINDEX, TROPONINI in the last 168 hours. BNP (last 3 results) No results for input(s): PROBNP in the last 8760 hours. HbA1C: No results for input(s): HGBA1C in the last 72 hours. CBG: No results for input(s): GLUCAP in the last 168 hours. Lipid Profile: No results for  input(s): CHOL, HDL, LDLCALC, TRIG, CHOLHDL, LDLDIRECT in the last 72 hours. Thyroid Function Tests: No results for input(s): TSH, T4TOTAL, FREET4, T3FREE, THYROIDAB in the last 72 hours. Anemia Panel: No results for input(s): VITAMINB12, FOLATE, FERRITIN, TIBC, IRON, RETICCTPCT in the last 72 hours. Urine analysis:    Component Value Date/Time   COLORURINE AMBER (A) 01/30/2016 1140   APPEARANCEUR CLOUDY (A) 01/30/2016 1140   LABSPEC 1.017 01/30/2016 1140   PHURINE 5.5 01/30/2016 1140   GLUCOSEU NEGATIVE 01/30/2016 1140   HGBUR LARGE (A) 01/30/2016 1140   BILIRUBINUR NEGATIVE 01/30/2016 1140   KETONESUR NEGATIVE 01/30/2016 1140   PROTEINUR 100 (A) 01/30/2016 1140   UROBILINOGEN 0.2 08/30/2014 2203   NITRITE NEGATIVE 01/30/2016 1140   LEUKOCYTESUR MODERATE (A) 01/30/2016 1140   Sepsis Labs: @LABRCNTIP (procalcitonin:4,lacticidven:4) ) Recent Results (from the past 240 hour(s))  Culture, blood (routine x 2)     Status: None (Preliminary result)   Collection Time: 01/30/16  4:24 PM  Result Value Ref Range Status   Specimen Description BLOOD RIGHT ANTECUBITAL  Final   Special Requests BOTTLES DRAWN AEROBIC AND ANAEROBIC 5CC  Final   Culture PENDING  Incomplete   Report Status PENDING  Incomplete  Wound or Superficial Culture     Status: None (Preliminary result)   Collection Time: 01/30/16  4:58 PM  Result Value Ref Range Status   Specimen Description WOUND  Final   Special Requests DECUBITUS  Final   Gram Stain PENDING  Incomplete   Culture PENDING  Incomplete   Report Status PENDING  Incomplete     Radiological Exams on Admission: No results found.  EKG: Independently reviewed. Sinus rhythm, early R-transition.  Assessment/Plan  1. Pressure wound with bacterial infection  - Sent from wound care clinic for right ischial pressure wound with apparent secondary bacterial infection - Pt reports chills and myalgias, has leukocytosis, reassuring lactate - Blood cultures  incubating, NS bolus given in ED  - Continue empiric coverage with vancomycin and Zosyn - Check inflammatory markers and radiograph of right hip and pelvis  - Wound care consultation requested    2. MS with spastic paraplegia  - Diagnosed in his mid-30's at [facility], underwent leukophoresis  - Stable - Not currently under treatment    3. AKI superimposed on CKD stage III  - SCr 1.70 on admission, up from apparent baseline of ~1.4  - Likely a  prerenal azotemia in the setting of infection  - Given 1 L NS in ED, continued on IVF hydration with NS overnight  - Repeat chem panel in am    4. Normocytic anemia  - Hgb 8.8 on admission and stable relative to priors in 7-8 range  - Likely secondary to chronic disease  - No s/s of active bleeding   5. Insulin-dependent DM  - A1c 6.1% in March 2017  - Managed at home with Levemir 18 units qAM and Novolog 8 units TID  - Check CBG with meals and qHS  - Start with Levemir 18 units qD, Novolog 3 units TID, and low-intensity sliding-scale correctional    6. UTI with chronic Foley catheter - UA is consistent with infection and sample sent for culture  - Pt has recurrent UTI's and prior culture has grown MRSA  - He is managed with daily suppressive trimethoprim; held for now while on vancomycin as above  - Current abx should suffice, will follow-up culture     DVT prophylaxis: sq heparin  Code Status: Full  Family Communication: Wife and son updated at bedside Disposition Plan: Admit to med-surg Consults called: None Admission status: Inpatient    Vianne Bulls, MD [facility] Pager [phone]  If 7PM-7AM, please contact night-coverage [email or web address] Password [facility]  01/30/2016, 7:28 PM

## 2016-01-30 NOTE — Progress Notes (Signed)
Pharmacy Antibiotic Note  Lance [name] is a 75 y.o. male admitted on 01/30/2016 with buttock wound.  Pharmacy has been consulted for vancomycin and zosyn dosing.  Plan:  Vancomycin 1250mg  IV q24h, goal trough 15-20 mcg/ml since hx MRSA  Zosyn 3.375gm IV q8h (4hr extended infusions)  Follow up renal function & cultures  Height: 6\' 4"  (193 cm) Weight: 180 lb (81.6 kg) IBW/kg (Calculated) : 86.8  Temp (24hrs), Avg:99.8 F (37.7 C), Min:99.5 F (37.5 C), Max:100 F (37.8 C)   Recent Labs Lab 01/30/16 1305 01/30/16 1623  WBC 12.2*  --   CREATININE 1.70*  --   LATICACIDVEN  --  0.96    Estimated Creatinine Clearance: 44 mL/min (by C-G formula based on SCr of 1.7 mg/dL (H)).  38 ml/min/1.60m2 (normalized)  No Known Allergies  Antimicrobials this admission:  9/28 Vancomycin >> 9/28 Zosyn >>  Dose adjustments this admission:  ---  Microbiology results:  09/05/14 L ischial bone: few MRSA 9/28 BCx: sent 9/28 UCx: sent  9/28 Wound (superficial):  Thank you for allowing pharmacy to be a part of this patient's care.  Peggyann Juba, PharmD, BCPS Pager: [phone] 01/30/2016 4:41 PM

## 2016-01-30 NOTE — ED Notes (Signed)
Timer started 8:18 over radio  Called floor

## 2016-01-30 NOTE — ED Triage Notes (Addendum)
Pt states he was sent from [place] for infected wound to right buttock. Pt denies fevers and abdominal pain, but reports increased body aches x3 days and diarrhea since yesterday. Hx MS

## 2016-01-30 NOTE — Progress Notes (Signed)
EDCM spoke to patient at bedside.  Patient reports he lives with his wife and and his special needs son.  Patient reports he has an electric wheelchair, air mattress, shower bench, bedside commode and elevated toilet seat.  He confirms he has a visiting RN from [facility] who comes out two times a week.  Patient expressed interest in an aide.  Radiology tech in to take patient to xray.

## 2016-01-30 NOTE — ED Provider Notes (Signed)
[facility] DEPT Provider Note   CSN: [id number] Arrival date & time: 01/30/16  1043     History   Chief Complaint Chief Complaint  Patient presents with  . Wound Infection    HPI Lance [name] is a 75 y.o. male.  Lance [name] is a 75 y.o. Male who presents to the ED from the wound care center with a grossly infected decubitus ulcer over his right buttocks. Patient has a history of MS and is wheelchair-bound. He has had difficulty with decubitus ulcers in the past. Patient was seen at the wound care center prior to arrival and was directed to the emergency department by Dr. Asa Saunas. Patient reports he's had this tic is also for approximately 4 weeks. It has worsened over the past 2 weeks and he has had foul drainage. No recent antibiotic use. Home health has been using dressing changes. Patient reports over the past 3 days he has had body aches and chills. No recorded temperature. He also reports yesterday he had some diarrhea that has resolved today. Patient self caths. Patient denies abdominal pain, nausea, vomiting, urinary symptoms, chest pain, shortness of breath, or coughing.   The history is provided by the patient, the spouse and medical records. No language interpreter was used.    Past Medical History:  Diagnosis Date  . Anemia in chronic renal disease   . Blood transfusion   . Blood transfusion without reported diagnosis   . Chronic spastic paraplegia (HCC)    secondary to MS  . CKD (chronic kidney disease), stage III   . Decreased sensation of lower extremity    due to MS  . Decubitus ulcer of ischium   . History of MRSA infection    urine  . History of recurrent UTIs   . Hypertension   . MS (multiple sclerosis) ([place])   . Neuralgia neuritis, sciatic nerve    MS for 30 years  . Neurogenic bladder   . PONV (postoperative nausea and vomiting)   . Self-catheterizes urinary bladder   . Type 2 diabetes mellitus [facility])     Patient Active Problem  List   Diagnosis Date Noted  . AKI (acute kidney injury) ([place]) 01/30/2016  . Cellulitis 01/30/2016  . DM (diabetes mellitus) type II controlled with renal manifestation ([place]) 09/06/2015  . Dyslipidemia associated with type 2 diabetes mellitus ([place]) 09/06/2015  . Recurrent UTI (urinary tract infection) 09/06/2015  . Bed sore on buttock 06/14/2015  . Bed sore on hip 06/14/2015  . Left perineal ischial pressure ulcer   . Osteomyelitis of pelvis ([place])   . Urinary tract infectious disease   . Chronic kidney disease, stage 3 08/03/2013  . Essential hypertension, benign 08/03/2013  . Cellulitis of foot 08/02/2013  . Spastic quadriplegia ([place]) 07/10/2011  . Multiple sclerosis ([place]) 03/30/2011  . Sacral decubitus ulcer 03/30/2011  . Normocytic anemia 03/30/2011    Past Surgical History:  Procedure Laterality Date  . APPLICATION OF A-CELL OF EXTREMITY Left 09/05/2014   Procedure: PLACEMENT OF ACELL AND VAC;  Surgeon: Theodoro Kos, DO;  Location: [facility];  Service: Plastics;  Laterality: Left;  . EXTRACORPOREAL SHOCK WAVE LITHOTRIPSY  03-23-2011  . FINGER SURGERY  2004  . INCISION AND DRAINAGE OF WOUND Left 09/05/2014   Procedure: IRRIGATION AND DEBRIDEMENT OF LEFT ISCHIUM WOUND WITH ;  Surgeon: Theodoro Kos, DO;  Location: [facility];  Service: Plastics;  Laterality: Left;  . TONSILLECTOMY    . WOUND DEBRIDEMENT  Home Medications    Prior to Admission medications   Medication Sig Start Date End Date Taking? Authorizing Provider  Ascorbic Acid (VITAMIN C PO) Take 1 tablet by mouth daily.    Yes Historical Provider, MD  aspirin 81 MG tablet Take 1 tablet (81 mg total) by mouth daily. 03/30/11  Yes Rolan Bucco, MD  Cholecalciferol (VITAMIN D3) 2000 UNITS TABS Take 2,000 mg by mouth daily.     Yes Historical Provider, MD  insulin aspart (NOVOLOG FLEXPEN) 100 UNIT/ML FlexPen Inject 8 Units into the skin 3 (three) times daily with meals. 09/07/14  Yes Domenic Polite, MD  LEVEMIR FLEXTOUCH  100 UNIT/ML Pen Inject 18 Units into the skin daily.  01/28/16  Yes Historical Provider, MD  Multiple Vitamin (MULTIVITAMIN WITH MINERALS) TABS tablet Take 1 tablet by mouth daily.   Yes Historical Provider, MD  oxybutynin (DITROPAN) 5 MG tablet Take 5 mg by mouth 2 (two) times daily.    Yes Historical Provider, MD  simvastatin (ZOCOR) 20 MG tablet Take 20 mg by mouth every evening.   Yes Historical Provider, MD  trimethoprim (TRIMPEX) 100 MG tablet Take 100 mg by mouth daily. 06/19/14  Yes Historical Provider, MD  bisacodyl (DULCOLAX) 10 MG suppository Place 1 suppository (10 mg total) rectally daily as needed for moderate constipation. 08/06/13   Josetta Huddle, MD  fish oil-omega-3 fatty acids 1000 MG capsule Take 1 capsule (1 g total) by mouth daily. Patient not taking: Reported on 01/30/2016 03/30/11   Rolan Bucco, MD    Family History Family History  Problem Relation Age of Onset  . Stroke Father   . Diabetes Father     Social History Social History  Substance Use Topics  . Smoking status: Former Smoker    Types: Cigars    Quit date: 05/06/1983  . Smokeless tobacco: Never Used  . Alcohol use 1.8 oz/week    3 Glasses of wine per week     Comment: 3 glasses of wine per week     Allergies   Review of patient's allergies indicates no known allergies.   Review of Systems Review of Systems  Constitutional: Positive for chills. Negative for fever.  HENT: Negative for congestion and sore throat.   Eyes: Negative for visual disturbance.  Respiratory: Negative for cough and shortness of breath.   Cardiovascular: Negative for chest pain.  Gastrointestinal: Positive for diarrhea. Negative for abdominal pain, blood in stool, nausea and vomiting.  Genitourinary: Negative for decreased urine volume.  Musculoskeletal: Positive for myalgias. Negative for back pain and neck pain.  Skin: Positive for wound.  Neurological: Negative for headaches.     Physical Exam Updated Vital  Signs BP 110/65   Pulse 85   Temp 100 F (37.8 C) (Rectal)   Resp 12   Ht 6\' 4"  (1.93 m)   Wt 81.6 kg   SpO2 98%   BMI 21.91 kg/m   Physical Exam  Constitutional: He is oriented to person, place, and time. He appears well-developed and well-nourished. No distress.  Nontoxic appearing.  HENT:  Head: Normocephalic and atraumatic.  Mouth/Throat: Oropharynx is clear and moist.  Eyes: Conjunctivae are normal. Pupils are equal, round, and reactive to light. Right eye exhibits no discharge. Left eye exhibits no discharge.  Neck: Neck supple.  Cardiovascular: Normal rate, regular rhythm, normal heart sounds and intact distal pulses.   Pulmonary/Chest: Effort normal and breath sounds normal. No respiratory distress. He has no wheezes. He has no rales.  Abdominal: Soft. Bowel  sounds are normal. He exhibits no distension. There is no tenderness. There is no guarding.  Abdomen is soft and nontender to palpation.  Genitourinary:  Genitourinary Comments: Approximately 6 cm decubitus ulcer with purulent discharge to his right buttocks over his initial tuberosity.  Musculoskeletal: He exhibits no edema.  Lymphadenopathy:    He has no cervical adenopathy.  Neurological: He is alert and oriented to person, place, and time. Coordination normal.  Skin: Skin is warm and dry. Capillary refill takes less than 2 seconds. No rash noted. He is not diaphoretic. No erythema. No pallor.  Psychiatric: He has a normal mood and affect. His behavior is normal.  Nursing note and vitals reviewed.    ED Treatments / Results  Labs (all labs ordered are listed, but only abnormal results are displayed) Labs Reviewed  COMPREHENSIVE METABOLIC PANEL - Abnormal; Notable for the following:       Result Value   CO2 21 (*)    Glucose, Bld 156 (*)    BUN 42 (*)    Creatinine, Ser 1.70 (*)    Calcium 8.7 (*)    Albumin 2.8 (*)    GFR calc non Af Amer 38 (*)    GFR calc Af Amer 44 (*)    All other components within  normal limits  CBC - Abnormal; Notable for the following:    WBC 12.2 (*)    RBC 2.94 (*)    Hemoglobin 8.8 (*)    HCT 27.3 (*)    All other components within normal limits  URINALYSIS, ROUTINE W REFLEX MICROSCOPIC (NOT AT [facility]) - Abnormal; Notable for the following:    Color, Urine AMBER (*)    APPearance CLOUDY (*)    Hgb urine dipstick LARGE (*)    Protein, ur 100 (*)    Leukocytes, UA MODERATE (*)    All other components within normal limits  URINE MICROSCOPIC-ADD ON - Abnormal; Notable for the following:    Squamous Epithelial / LPF 0-5 (*)    Bacteria, UA MANY (*)    All other components within normal limits  CULTURE, BLOOD (ROUTINE X 2)  AEROBIC CULTURE (SUPERFICIAL SPECIMEN)  CULTURE, BLOOD (ROUTINE X 2)  URINE CULTURE  LIPASE, BLOOD  I-STAT CG4 LACTIC ACID, ED    EKG  EKG Interpretation None       Radiology No results found.  Procedures Procedures (including critical care time)  Medications Ordered in ED Medications  vancomycin (VANCOCIN) 1,250 mg in sodium chloride 0.9 % 250 mL IVPB (not administered)  piperacillin-tazobactam (ZOSYN) IVPB 3.375 g (not administered)  sodium chloride 0.9 % bolus 1,000 mL (0 mLs Intravenous Stopped 01/30/16 1658)  vancomycin (VANCOCIN) 1,250 mg in sodium chloride 0.9 % 250 mL IVPB (1,250 mg Intravenous New Bag/Given 01/30/16 1744)  piperacillin-tazobactam (ZOSYN) IVPB 3.375 g (0 g Intravenous Stopped 01/30/16 1738)     Initial Impression / Assessment and Plan / ED Course  I have reviewed the triage vital signs and the nursing notes.  Pertinent labs & imaging results that were available during my care of the patient were reviewed by me and considered in my medical decision making (see chart for details).  Clinical Course   This is a 75 y.o. Male who presents to the ED from the wound care center with a grossly infected decubitus ulcer over his right buttocks. Patient has a history of MS and is wheelchair-bound. He has had  difficulty with decubitus ulcers in the past. Patient was seen at the wound  care center prior to arrival and was directed to the emergency department by Dr. Asa Saunas. Patient reports he's had this tic is also for approximately 4 weeks. It has worsened over the past 2 weeks and he has had foul drainage. No recent antibiotic use. Home health has been using dressing changes. Patient reports over the past 3 days he has had body aches and chills. On arrival the patient has a rectal temperature 100.0. He is nontoxic appearing. Abdomen is soft and nontender. He has a large approximately 6 x 4 cm infected decubitus ulcer with purulent drainage to his right initial tuberosity. Patient's white count returned elevated at 12,200. This patient has borderline fever, elevated white count and a heart rate greater than 98 code sepsis was activated. Blood cultures pending. Wound culture ordered. Lactic acid returned at 0.96. CMP reveals an elevated creatinine 1.70 which is 0.3 increase from his baseline. Patient self caths. Urine showed moderate leukocytes with too numerous to count white blood cells. Urine sent for culture. Patient was started on vancomycin and Zosyn for his infected decubitus ulcer. Will consult for admission. I spoke with hospitalist Dr. Myna Hidalgo who accepted the patient for admission and requested med surg temp orders.  Patient and family are in agreement with admission.   This patient was discussed with and evaluated by Dr. Thomasene Lot who agrees with assessment and plan.   Final Clinical Impressions(s) / ED Diagnoses   Final diagnoses:  Infected decubitus ulcer, unspecified pressure ulcer stage    New Prescriptions New Prescriptions   No medications on file     Waynetta Pean, PA-C 01/30/16 [place], MD 01/30/16 2342

## 2016-01-31 LAB — CBC WITH DIFFERENTIAL/PLATELET
Basophils Absolute: 0 10*3/uL (ref 0.0–0.1)
Basophils Relative: 0 %
EOS ABS: 0.3 10*3/uL (ref 0.0–0.7)
EOS PCT: 4 %
HCT: 23.3 % — ABNORMAL LOW (ref 39.0–52.0)
Hemoglobin: 7.7 g/dL — ABNORMAL LOW (ref 13.0–17.0)
LYMPHS ABS: 1.3 10*3/uL (ref 0.7–4.0)
LYMPHS PCT: 15 %
MCH: 30.4 pg (ref 26.0–34.0)
MCHC: 33 g/dL (ref 30.0–36.0)
MCV: 92.1 fL (ref 78.0–100.0)
MONO ABS: 0.5 10*3/uL (ref 0.1–1.0)
MONOS PCT: 6 %
Neutro Abs: 6.2 10*3/uL (ref 1.7–7.7)
Neutrophils Relative %: 75 %
PLATELETS: 297 10*3/uL (ref 150–400)
RBC: 2.53 MIL/uL — AB (ref 4.22–5.81)
RDW: 15.1 % (ref 11.5–15.5)
WBC: 8.3 10*3/uL (ref 4.0–10.5)

## 2016-01-31 LAB — BASIC METABOLIC PANEL
Anion gap: 5 (ref 5–15)
BUN: 39 mg/dL — AB (ref 6–20)
CO2: 23 mmol/L (ref 22–32)
CREATININE: 1.69 mg/dL — AB (ref 0.61–1.24)
Calcium: 8.1 mg/dL — ABNORMAL LOW (ref 8.9–10.3)
Chloride: 112 mmol/L — ABNORMAL HIGH (ref 101–111)
GFR calc Af Amer: 44 mL/min — ABNORMAL LOW (ref >60)
GFR, EST NON AFRICAN AMERICAN: 38 mL/min — AB (ref >60)
GLUCOSE: 143 mg/dL — AB (ref 65–99)
POTASSIUM: 4.4 mmol/L (ref 3.5–5.1)
SODIUM: 140 mmol/L (ref 135–145)

## 2016-01-31 LAB — PROTIME-INR
INR: 1.1
PROTHROMBIN TIME: 14.3 s (ref 11.4–15.2)

## 2016-01-31 LAB — MRSA PCR SCREENING: MRSA BY PCR: NEGATIVE

## 2016-01-31 LAB — GLUCOSE, CAPILLARY
GLUCOSE-CAPILLARY: 182 mg/dL — AB (ref 65–99)
GLUCOSE-CAPILLARY: 122 mg/dL — AB (ref 65–99)
Glucose-Capillary: 85 mg/dL (ref 65–99)
Glucose-Capillary: 134 mg/dL — ABNORMAL HIGH (ref 65–99)

## 2016-01-31 LAB — C-REACTIVE PROTEIN: CRP: 15.9 mg/dL — ABNORMAL HIGH (ref <1.0)

## 2016-01-31 MED ORDER — COLLAGENASE 250 UNIT/GM EX OINT
TOPICAL_OINTMENT | Freq: Every day | CUTANEOUS | Status: DC
  Administered 2016-01-31 – 2016-02-05 (×4): via TOPICAL
  Filled 2016-01-31: qty 30

## 2016-01-31 MED ORDER — PREMIER PROTEIN SHAKE
11.0000 [oz_av] | Freq: Two times a day (BID) | ORAL | Status: DC
  Administered 2016-01-31 – 2016-02-05 (×11): 11 [oz_av] via ORAL
  Filled 2016-01-31 (×14): qty 325.31

## 2016-01-31 NOTE — Progress Notes (Signed)
PROGRESS NOTE    Lance [name]  OMV:[id number] [name]: 05/06/1940 DOA: 01/30/2016 PCP: Pcp Not In System   Brief Narrative: Lance [name] is a 75 y.o. male with medical history significant for multiple sclerosis with spastic paraplegia, chronic kidney disease stage III, insulin-dependent diabetes mellitus, chronic Foley catheter with recurrent UTI, and initial pressure was around who presents to the emergency department at the direction of his wound care physician for evaluation of right ischial pressure ulcer with suspected secondary bacterial infection and concern for underlying osteomyelitis.    Assessment & Plan:   Principal Problem:   Cellulitis Active Problems:   Multiple sclerosis (HCC)   Sacral decubitus ulcer   Normocytic anemia   Spastic quadriplegia (HCC)   Chronic kidney disease, stage 3   Essential hypertension, benign   Urinary tract infectious disease   DM (diabetes mellitus) type II controlled with renal manifestation (HCC)   Recurrent UTI (urinary tract infection)   AKI (acute kidney injury) ([place])   Wound infection ([place])   Necrotic unstageable pressure ulcer of the right ischium: X rays do not reveal any osteomyelitis.  Wound care consulted and recommendations given.  Will get surgeon to see if he needs debridement.  Air mattress ordered.  Currently on antibiotics.    Mild acute on CKD stage 3; Gentle hydration and repeat renal parameters in am.   UTI in pt with chronic foley catheter.  Cultures pending.  Resume IV antibiotics.   Insulin dependent DM; CBG (last 3)   Recent Labs  01/30/16 2207 01/31/16 0825 01/31/16 1151  GLUCAP 173* 85 134*    Resume SSI AND PREMEAL coverage.   MS with spastic paraplegia; Stable.        DVT prophylaxis: (Lovenox/) Code Status: full code.  Family Communication: none at bedside.  Disposition Plan: pending further evaluation.    Consultants:   Wound care.    Procedures:  none   Antimicrobials: vancomycin   Zosyn 9/29   Subjective: Reports pain is better controlled.  Objective: Vitals:   01/30/16 2054 01/30/16 2131 01/31/16 0554 01/31/16 1441  BP:  (!) 135/56 (!) 122/54 (!) 120/45  Pulse:  80 70 75  Resp:  16 18 18   Temp:  98.9 F (37.2 C) 97.6 F (36.4 C)   TempSrc:  Oral Oral Oral  SpO2: 99% 98% 97% 98%  Weight:  82 kg (180 lb 12.4 oz)    Height:  6\' 4"  (1.93 m)      Intake/Output Summary (Last 24 hours) at 01/31/16 1636 Last data filed at 01/31/16 1334  Gross per 24 hour  Intake             1290 ml  Output             1000 ml  Net              290 ml   Filed Weights   01/30/16 1142 01/30/16 2131  Weight: 81.6 kg (180 lb) 82 kg (180 lb 12.4 oz)    Examination:  General exam: Appears calm and comfortable  Respiratory system: Clear to auscultation. Respiratory effort normal. Cardiovascular system: S1 & S2 heard, RRR. No JVD, murmurs, rubs, gallops or clicks. Gastrointestinal system: Abdomen is nondistended, soft and nontender. No organomegaly or masses felt. Normal bowel sounds heard. Central nervous system: Alert and oriented. Extremities: contracted lower extremities.  Skin: unstageable pressure ulcer ot the right ischium, with necrotic tissue.  Psychiatry: Judgement and insight appear normal. Mood & affect appropriate.  Data Reviewed: I have personally reviewed following labs and imaging studies  CBC:  Recent Labs Lab 01/30/16 1305 01/31/16 0533  WBC 12.2* 8.3  NEUTROABS  --  6.2  HGB 8.8* 7.7*  HCT 27.3* 23.3*  MCV 92.9 92.1  PLT 335 774   Basic Metabolic Panel:  Recent Labs Lab 01/30/16 1305 01/31/16 0533  NA 136 140  K 4.8 4.4  CL 106 112*  CO2 21* 23  GLUCOSE 156* 143*  BUN 42* 39*  CREATININE 1.70* 1.69*  CALCIUM 8.7* 8.1*   GFR: Estimated Creatinine Clearance: 44.5 mL/min (by C-G formula based on SCr of 1.69 mg/dL (H)). Liver Function Tests:  Recent Labs Lab 01/30/16 1305  AST 36  ALT  35  ALKPHOS 85  BILITOT 0.7  PROT 7.8  ALBUMIN 2.8*    Recent Labs Lab 01/30/16 1305  LIPASE 18   No results for input(s): AMMONIA in the last 168 hours. Coagulation Profile:  Recent Labs Lab 01/31/16 0533  INR 1.10   Cardiac Enzymes: No results for input(s): CKTOTAL, CKMB, CKMBINDEX, TROPONINI in the last 168 hours. BNP (last 3 results) No results for input(s): PROBNP in the last 8760 hours. HbA1C: No results for input(s): HGBA1C in the last 72 hours. CBG:  Recent Labs Lab 01/30/16 2207 01/31/16 0825 01/31/16 1151  GLUCAP 173* 85 134*   Lipid Profile: No results for input(s): CHOL, HDL, LDLCALC, TRIG, CHOLHDL, LDLDIRECT in the last 72 hours. Thyroid Function Tests: No results for input(s): TSH, T4TOTAL, FREET4, T3FREE, THYROIDAB in the last 72 hours. Anemia Panel: No results for input(s): VITAMINB12, FOLATE, FERRITIN, TIBC, IRON, RETICCTPCT in the last 72 hours. Sepsis Labs:  Recent Labs Lab 01/30/16 1623  LATICACIDVEN 0.96    Recent Results (from the past 240 hour(s))  Urine culture     Status: Abnormal (Preliminary result)   Collection Time: 01/30/16 11:20 AM  Result Value Ref Range Status   Specimen Description URINE, CLEAN CATCH  Final   Special Requests NONE  Final   Culture (A)  Final    >=100,000 COLONIES/mL GRAM POSITIVE COCCI >=100,000 COLONIES/mL KLEBSIELLA PNEUMONIAE IDENTIFICATION AND SUSCEPTIBILITIES TO FOLLOW Performed at [facility]    Report Status PENDING  Incomplete  Culture, blood (routine x 2)     Status: None (Preliminary result)   Collection Time: 01/30/16  4:11 PM  Result Value Ref Range Status   Specimen Description BLOOD LEFT ANTECUBITAL  Final   Special Requests BOTTLES DRAWN AEROBIC AND ANAEROBIC 5ML EA  Final   Culture   Final    NO GROWTH < 24 HOURS Performed at [facility]    Report Status PENDING  Incomplete  Culture, blood (routine x 2)     Status: None (Preliminary result)   Collection Time:  01/30/16  4:24 PM  Result Value Ref Range Status   Specimen Description BLOOD RIGHT ANTECUBITAL  Final   Special Requests BOTTLES DRAWN AEROBIC AND ANAEROBIC 5CC  Final   Culture   Final    NO GROWTH < 24 HOURS Performed at [facility]    Report Status PENDING  Incomplete  Wound or Superficial Culture     Status: None (Preliminary result)   Collection Time: 01/30/16  4:58 PM  Result Value Ref Range Status   Specimen Description WOUND  Final   Special Requests DECUBITUS  Final   Gram Stain   Final    ABUNDANT WBC PRESENT,BOTH PMN AND MONONUCLEAR ABUNDANT GRAM NEGATIVE RODS ABUNDANT GRAM POSITIVE COCCI IN  PAIRS    Culture   Final    CULTURE REINCUBATED FOR BETTER GROWTH Performed at [facility]    Report Status PENDING  Incomplete  MRSA PCR Screening     Status: None   Collection Time: 01/31/16  4:30 AM  Result Value Ref Range Status   MRSA by PCR NEGATIVE NEGATIVE Final    Comment:        The GeneXpert MRSA Assay (FDA approved for NASAL specimens only), is one component of a comprehensive MRSA colonization surveillance program. It is not intended to diagnose MRSA infection nor to guide or monitor treatment for MRSA infections.          Radiology Studies: Dg Hip Unilat With Pelvis 2-3 Views Right  Result Date: 01/30/2016 CLINICAL DATA:  75 year old with approximate 6 week history of a decubitus ulcer involving the right buttock, with recent worsening of the infection. EXAM: DG HIP (WITH OR WITHOUT PELVIS) 2-3V RIGHT COMPARISON:  Right hip x-rays 05/28/2015.  MRI pelvis 09/01/2014. FINDINGS: No evidence of acute fracture or dislocation. No evidence of osteomyelitis. No visible joint effusion. Moderate joint space narrowing. Bone mineral density well-preserved. Included AP pelvis again shows the healed osteomyelitis with new bone formation at the left ischium. Contralateral left hip joint with symmetric moderate joint space narrowing. Sacroiliac joints and  symphysis pubis intact. Mild degenerative changes involving the visualized lower lumbar spine. Penile prosthesis again noted. IMPRESSION: No acute osseous abnormality. No radiographic evidence of acute osteomyelitis. Electronically Signed   By: Evangeline Dakin M.D.   On: 01/30/2016 20:36        Scheduled Meds: . cholecalciferol  2,000 Units Oral Daily  . collagenase   Topical Daily  . heparin  5,000 Units Subcutaneous Q8H  . insulin aspart  0-5 Units Subcutaneous QHS  . insulin aspart  0-9 Units Subcutaneous TID WC  . insulin aspart  3 Units Subcutaneous TID WC  . insulin detemir  18 Units Subcutaneous QHS  . multivitamin with minerals  1 tablet Oral Daily  . oxybutynin  5 mg Oral BID  . piperacillin-tazobactam (ZOSYN)  IV  3.375 g Intravenous Q8H  . protein supplement shake  11 oz Oral BID BM  . simvastatin  20 mg Oral QPM  . vancomycin  1,250 mg Intravenous Q24H   Continuous Infusions:    LOS: 1 day    Time spent: 30 minutes.     Hosie Poisson, MD [facility] Pager [phone]   If 7PM-7AM, please contact night-coverage [email or web address] Password [facility] 01/31/2016, 4:36 PM

## 2016-01-31 NOTE — Care Management Note (Signed)
Case Management Note  Patient Details  Name: Lance [name] MRN: [id number] Date of Birth: 1940-05-20  Subjective/Objective:  75 y/o m admitted w/Cellulitis. From home. Will confirm if [facility] was used or if active-await response.Will monitor for d/c needs.                  Action/Plan:d/c plan home.   Expected Discharge Date:   (unknown)               Expected Discharge Plan:  [place]  In-House Referral:     Discharge planning Services  CM Consult  Post Acute Care Choice:    Choice offered to:     DME Arranged:    DME Agency:     HH Arranged:    HH Agency:     Status of Service:  In process, will continue to follow  If discussed at Long Length of Stay Meetings, dates discussed:    Additional Comments:  Dessa Phi, RN 01/31/2016, 10:53 AM

## 2016-01-31 NOTE — Consult Note (Signed)
[place] Nurse wound consult note Reason for Consult:Unstageable pressure injury to right ischium in patient with flaps in the past performed at [facility].  Followed by Laurene Footman at the [facility]. Wound type:Pressure Pressure Ulcer POA: Yes Measurement:6cm x 3cm with depth obscured by necrotic tissue Wound bed:AS described above Drainage (amount, consistency, odor) Serous Periwound:erythematous Dressing procedure/placement/frequency:Will begin collagenase, but patient will require debridement.  Please consult surgery (Plastics) or consider referral to [facility] (Dr. Luetta Nutting) who has operated on patient in the past. [place] nursing team will not follow, but will remain available to this patient, the nursing and medical teams.  Please re-consult if needed. Thanks, Maudie Flakes, MSN, RN, [place], Arther Abbott  Pager# [phone]

## 2016-01-31 NOTE — Care Management Note (Signed)
Case Management Note  Patient Details  Name: WIL SLAPE MRN: [id number] Date of Birth: 29-May-1940  Subjective/Objective:  Patient confirmed active w/[facility] RN. [facility] rep-Karen aware.If needed @ d/c please resume HHRN order.                  Action/Plan:d/c home w/HHC.   Expected Discharge Date:   (unknown)               Expected Discharge Plan:  [place]  In-House Referral:     Discharge planning Services  CM Consult  Post Acute Care Choice:  Home Health [facility] active w/HHRN) Choice offered to:     DME Arranged:    DME Agency:     HH Arranged:    HH Agency:     Status of Service:  In process, will continue to follow  If discussed at Long Length of Stay Meetings, dates discussed:    Additional Comments:  Dessa Phi, RN 01/31/2016, 10:55 AM

## 2016-01-31 NOTE — Progress Notes (Signed)
Patient transferred to air mattress, tolerated well.  Dsg change completed per WOC orders. Patient has a moderate collection of fluid inside the wound.

## 2016-01-31 NOTE — Progress Notes (Signed)
Initial Nutrition Assessment  DOCUMENTATION CODES:   Not applicable  INTERVENTION:  Premier protein BID po, each supplement contains 130 kcal and 30 grams protein.   Snacks once daily in evening. Ordered for patient to come each night.   Continue MVI to ensure patient receiving adequate Vitamin C and Zinc for wound healing.  NUTRITION DIAGNOSIS:   Increased nutrient needs related to wound healing as evidenced by estimated needs.  GOAL:   Patient will meet greater than or equal to 90% of their needs  MONITOR:   PO intake, Supplement acceptance, Labs, Weight trends, I & O's, Skin  REASON FOR ASSESSMENT:   Malnutrition Screening Tool    ASSESSMENT:   75 y.o. male with medical history significant for multiple sclerosis with spastic paraplegia, chronic kidney disease stage III, insulin-dependent diabetes mellitus, chronic Foley catheter with recurrent UTI, and initial pressure was around who presents to the emergency department at the direction of his wound care physician for evaluation of right ischial pressure ulcer with suspected secondary bacterial infection and concern for underlying osteomyelitis. Patient is wheelchair bound secondary to his MS and associated paraplegia, and has history of left ischial pressure wound which became infected with underlying osteomyelitis and is status post surgical debridement and skin flap. Patient has more recently developed a wound over the right ischium and has been following with wound care.  Patient's wife and son present upon assessment. Patient reports he has had weight loss over the past year or so due to his wounds and surgeries (debridement and skin flaps). UBW 200 lbs. Per chart he lost 16.1 kg (17% body weight) over 1 year and has since been regaining weight. Weight loss was not significant for time frame. His appetite was poor for a while but he reports it had been getting better and he's been gaining weight recently. Acutely, his  appetite got much worse on 9/25, but it is getting better again now. Patient and wife are very well-educated in nutrition related to both needs for wounds and also in management of DM. Patient usually eats 3 meals/day + 1 snack in the evening. He also has 1-2 Premier protein drinks daily and takes Vitamin D, Vitamin C, and a MVI with "adequate zinc content."   Meal Completion: 100% breakfast and lunch today per chart and patient report. Patient has had 1129 kcal and 48 grams protein so far today, with plans to order dinner.   Medications reviewed and include: Vitamin D 2000 units daily, Novolog sliding scale TID with meals and daily at bedtime, Levemir 18 units daily at bedtime, MVI daily.  Labs reviewed: CBG 85-173 since admission, Chloride 112, CRP 15.9, HgbA1c 6.1 on 08/02/2015.   Nutrition-Focused physical exam completed. Findings are moderate fat depletion, severe muscle depletion, and no edema. Depletion on legs are the most severe in setting of paraplegia.   Discussed plan with RN.   Diet Order:  Diet regular Room service appropriate? Yes; Fluid consistency: Thin  Skin:  Wound (see comment) (Unstageable to R ischial, closed incision L hip)  Last BM:  01/29/2016  Height:   Ht Readings from Last 1 Encounters:  01/30/16 6\' 4"  (1.93 m)    Weight:   Wt Readings from Last 1 Encounters:  01/30/16 180 lb 12.4 oz (82 kg)    Ideal Body Weight:  91.82 kg  BMI:  Body mass index is 22 kg/m.  Estimated Nutritional Needs:   Kcal:  2288 (27.9 kcal/kg per EAL for paraplegia)  Protein:  82-123 grams (1-1.5  g/kg per EAL for paraplegia with chronic wound)  Fluid:  2.2 L/day  EDUCATION NEEDS:   Education needs addressed (Increased protein and calorie needs for wound healing.)  Willey Blade, MS, RD, LDN Pager: [phone] After Hours Pager: [phone]

## 2016-02-01 ENCOUNTER — Encounter (HOSPITAL_COMMUNITY): Payer: Self-pay | Admitting: Surgery

## 2016-02-01 DIAGNOSIS — L89310 Pressure ulcer of right buttock, unstageable: Principal | ICD-10-CM

## 2016-02-01 LAB — CBC
HCT: 22.2 % — ABNORMAL LOW (ref 39.0–52.0)
Hemoglobin: 7.4 g/dL — ABNORMAL LOW (ref 13.0–17.0)
MCH: 29.7 pg (ref 26.0–34.0)
MCHC: 33.3 g/dL (ref 30.0–36.0)
MCV: 89.2 fL (ref 78.0–100.0)
PLATELETS: 297 10*3/uL (ref 150–400)
RBC: 2.49 MIL/uL — ABNORMAL LOW (ref 4.22–5.81)
RDW: 14.9 % (ref 11.5–15.5)
WBC: 7.4 10*3/uL (ref 4.0–10.5)

## 2016-02-01 LAB — GLUCOSE, CAPILLARY
GLUCOSE-CAPILLARY: 124 mg/dL — AB (ref 65–99)
GLUCOSE-CAPILLARY: 142 mg/dL — AB (ref 65–99)
Glucose-Capillary: 142 mg/dL — ABNORMAL HIGH (ref 65–99)
Glucose-Capillary: 149 mg/dL — ABNORMAL HIGH (ref 65–99)

## 2016-02-01 LAB — URINE CULTURE

## 2016-02-01 LAB — PREPARE RBC (CROSSMATCH)

## 2016-02-01 LAB — PREALBUMIN: PREALBUMIN: 3.1 mg/dL — AB (ref 18–38)

## 2016-02-01 MED ORDER — SODIUM CHLORIDE 0.9 % IV SOLN
Freq: Once | INTRAVENOUS | Status: AC
  Administered 2016-02-01: 14:00:00 via INTRAVENOUS

## 2016-02-01 NOTE — Consult Note (Signed)
General Surgery [facility]  Reason for Consult: right ischial decubitus ulcer  Referring Physician: Dr. Karleen Hampshire, [facility]  Lance [name] is an 75 y.o. male.   HPI: Patient is a pleasant 75 yo [employer] graduate with newly developed right ischial decubitus ulcer.  Patient has history of decubitus ulcers bilaterally treated with flap closure successfully.  Recent admission to rehab and discharge home when noted development of skin breakdown in right ischial region.  Admitted on medical service.  Plain radiographs obtained.  General surgery asked to evaluate for possible operative debridement.  No signs or symptoms of sepsis.  Patient is not ambulatory.  Patient's wife and son in the room during exam.  Questions addressed.  Past Medical History:  Diagnosis Date  . Anemia in chronic renal disease   . Blood transfusion   . Blood transfusion without reported diagnosis   . Chronic spastic paraplegia (HCC)    secondary to MS  . CKD (chronic kidney disease), stage III   . Decreased sensation of lower extremity    due to MS  . Decubitus ulcer of ischium   . History of MRSA infection    urine  . History of recurrent UTIs   . Hypertension   . MS (multiple sclerosis) ([place])   . Neuralgia neuritis, sciatic nerve    MS for 30 years  . Neurogenic bladder   . PONV (postoperative nausea and vomiting)   . Self-catheterizes urinary bladder   . Type 2 diabetes mellitus ([place])     Past Surgical History:  Procedure Laterality Date  . APPLICATION OF A-CELL OF EXTREMITY Left 09/05/2014   Procedure: PLACEMENT OF ACELL AND VAC;  Surgeon: Theodoro Kos, DO;  Location: [place];  Service: Plastics;  Laterality: Left;  . EXTRACORPOREAL SHOCK WAVE LITHOTRIPSY  03-23-2011  . FINGER SURGERY  2004  . INCISION AND DRAINAGE OF WOUND Left 09/05/2014   Procedure: IRRIGATION AND DEBRIDEMENT OF LEFT ISCHIUM WOUND WITH ;  Surgeon: Theodoro Kos, DO;  Location: [place];  Service: Plastics;   Laterality: Left;  . TONSILLECTOMY    . WOUND DEBRIDEMENT      Family History  Problem Relation Age of Onset  . Stroke Father   . Diabetes Father     Social History:  reports that he quit smoking about 32 years ago. His smoking use included Cigars. He has never used smokeless tobacco. He reports that he drinks about 1.8 oz of alcohol per week . He reports that he does not use drugs.  Allergies: No Known Allergies  Medications: I have reviewed the patient's current medications.  Results for orders placed or performed during the hospital encounter of 01/30/16 (from the past 48 hour(s))  Wound or Superficial Culture     Status: None (Preliminary result)   Collection Time: 01/30/16  4:58 PM  Result Value Ref Range   Specimen Description WOUND    Special Requests DECUBITUS    Gram Stain      ABUNDANT WBC PRESENT,BOTH PMN AND MONONUCLEAR ABUNDANT GRAM NEGATIVE RODS ABUNDANT GRAM POSITIVE COCCI IN PAIRS Performed at [facility]    Report Status PENDING   Glucose, capillary     Status: Abnormal   Collection Time: 01/30/16 10:07 PM  Result Value Ref Range   Glucose-Capillary 173 (H) 65 - 99 mg/dL  MRSA PCR Screening     Status: None   Collection Time: 01/31/16  4:30 AM  Result Value Ref  Range   MRSA by PCR NEGATIVE NEGATIVE    Comment:        The GeneXpert MRSA Assay (FDA approved for NASAL specimens only), is one component of a comprehensive MRSA colonization surveillance program. It is not intended to diagnose MRSA infection nor to guide or monitor treatment for MRSA infections.   CBC WITH DIFFERENTIAL     Status: Abnormal   Collection Time: 01/31/16  5:33 AM  Result Value Ref Range   WBC 8.3 4.0 - 10.5 K/uL   RBC 2.53 (L) 4.22 - 5.81 MIL/uL   Hemoglobin 7.7 (L) 13.0 - 17.0 g/dL   HCT 23.3 (L) 39.0 - 52.0 %   MCV 92.1 78.0 - 100.0 fL   MCH 30.4 26.0 - 34.0 pg   MCHC 33.0 30.0 - 36.0 g/dL   RDW 15.1 11.5 - 15.5 %    Platelets 297 150 - 400 K/uL   Neutrophils Relative % 75 %   Neutro Abs 6.2 1.7 - 7.7 K/uL   Lymphocytes Relative 15 %   Lymphs Abs 1.3 0.7 - 4.0 K/uL   Monocytes Relative 6 %   Monocytes Absolute 0.5 0.1 - 1.0 K/uL   Eosinophils Relative 4 %   Eosinophils Absolute 0.3 0.0 - 0.7 K/uL   Basophils Relative 0 %   Basophils Absolute 0.0 0.0 - 0.1 K/uL  Basic metabolic panel     Status: Abnormal   Collection Time: 01/31/16  5:33 AM  Result Value Ref Range   Sodium 140 135 - 145 mmol/L   Potassium 4.4 3.5 - 5.1 mmol/L   Chloride 112 (H) 101 - 111 mmol/L   CO2 23 22 - 32 mmol/L   Glucose, Bld 143 (H) 65 - 99 mg/dL   BUN 39 (H) 6 - 20 mg/dL   Creatinine, Ser 1.69 (H) 0.61 - 1.24 mg/dL   Calcium 8.1 (L) 8.9 - 10.3 mg/dL   GFR calc non Af Amer 38 (L) >60 mL/min   GFR calc Af Amer 44 (L) >60 mL/min    Comment: (NOTE) The eGFR has been calculated using the CKD EPI equation. This calculation has not been validated in all clinical situations. eGFR's persistently <60 mL/min signify possible Chronic Kidney Disease.    Anion gap 5 5 - 15  Protime-INR     Status: None   Collection Time: 01/31/16  5:33 AM  Result Value Ref Range   Prothrombin Time 14.3 11.4 - 15.2 seconds   INR 1.10   C-reactive protein     Status: Abnormal   Collection Time: 01/31/16  5:33 AM  Result Value Ref Range   CRP 15.9 (H) <1.0 mg/dL    Comment: Performed at [facility]  Glucose, capillary     Status: None   Collection Time: 01/31/16  8:25 AM  Result Value Ref Range   Glucose-Capillary 85 65 - 99 mg/dL  Glucose, capillary     Status: Abnormal   Collection Time: 01/31/16 11:51 AM  Result Value Ref Range   Glucose-Capillary 134 (H) 65 - 99 mg/dL  Glucose, capillary     Status: Abnormal   Collection Time: 01/31/16  5:02 PM  Result Value Ref Range   Glucose-Capillary 182 (H) 65 - 99 mg/dL  Glucose, capillary     Status: Abnormal   Collection Time: 01/31/16  9:12 PM  Result Value Ref Range    Glucose-Capillary 122 (H) 65 - 99 mg/dL   Comment 1 Notify RN   Prealbumin     Status:  Abnormal   Collection Time: 02/01/16  5:06 AM  Result Value Ref Range   Prealbumin 3.1 (L) 18 - 38 mg/dL    Comment: Performed at [facility]  CBC     Status: Abnormal   Collection Time: 02/01/16  5:06 AM  Result Value Ref Range   WBC 7.4 4.0 - 10.5 K/uL   RBC 2.49 (L) 4.22 - 5.81 MIL/uL   Hemoglobin 7.4 (L) 13.0 - 17.0 g/dL   HCT 22.2 (L) 39.0 - 52.0 %   MCV 89.2 78.0 - 100.0 fL   MCH 29.7 26.0 - 34.0 pg   MCHC 33.3 30.0 - 36.0 g/dL   RDW 14.9 11.5 - 15.5 %   Platelets 297 150 - 400 K/uL  Glucose, capillary     Status: Abnormal   Collection Time: 02/01/16  7:58 AM  Result Value Ref Range   Glucose-Capillary 124 (H) 65 - 99 mg/dL   Comment 1 Notify RN    Comment 2 Document in Chart   Glucose, capillary     Status: Abnormal   Collection Time: 02/01/16 11:39 AM  Result Value Ref Range   Glucose-Capillary 142 (H) 65 - 99 mg/dL   Comment 1 Notify RN    Comment 2 Document in Chart   Prepare RBC     Status: None   Collection Time: 02/01/16 12:10 PM  Result Value Ref Range   Order Confirmation ORDER PROCESSED BY BLOOD BANK   Type and screen [place]     Status: None (Preliminary result)   Collection Time: 02/01/16 12:10 PM  Result Value Ref Range   ABO/RH(D) A POS    Antibody Screen NEG    Sample Expiration 02/04/2016    Unit Number [id number]    Blood Component Type RED CELLS,LR    Unit division 00    Status of Unit ISSUED    Transfusion Status OK TO TRANSFUSE    Crossmatch Result Compatible     Dg Hip Unilat With Pelvis 2-3 Views Right  Result Date: 01/30/2016 CLINICAL DATA:  75 year old with approximate 6 week history of a decubitus ulcer involving the right buttock, with recent worsening of the infection. EXAM: DG HIP (WITH OR WITHOUT PELVIS) 2-3V RIGHT COMPARISON:  Right hip x-rays 05/28/2015.  MRI pelvis 09/01/2014. FINDINGS: No evidence of acute  fracture or dislocation. No evidence of osteomyelitis. No visible joint effusion. Moderate joint space narrowing. Bone mineral density well-preserved. Included AP pelvis again shows the healed osteomyelitis with new bone formation at the left ischium. Contralateral left hip joint with symmetric moderate joint space narrowing. Sacroiliac joints and symphysis pubis intact. Mild degenerative changes involving the visualized lower lumbar spine. Penile prosthesis again noted. IMPRESSION: No acute osseous abnormality. No radiographic evidence of acute osteomyelitis. Electronically Signed   By: Evangeline Dakin M.D.   On: 01/30/2016 20:36    Review of Systems  Constitutional: Negative for chills and fever.  HENT: Negative.   Eyes: Negative.   Respiratory: Negative.   Cardiovascular: Negative.   Gastrointestinal: Negative.   Genitourinary: Negative.   Musculoskeletal:       Paraplegia  Skin:       Decubitus ulcer right ischial region  Endo/Heme/Allergies: Negative.    Blood pressure (!) 151/77, pulse 76, temperature 99 F (37.2 C), temperature source Oral, resp. rate 18, height '6\' 4"'  (1.93 m), weight 82 kg (180 lb 12.4 oz), SpO2 97 %. Physical Exam  Constitutional: He appears well-developed and well-nourished. No distress.  Recumbent in  bed  HENT:  Head: Normocephalic and atraumatic.  Right Ear: External ear normal.  Left Ear: External ear normal.  Eyes: Conjunctivae are normal. Pupils are equal, round, and reactive to light. No scleral icterus.  Neck: Normal range of motion. Neck supple. No thyromegaly present.  GI: Soft. He exhibits no distension.  Musculoskeletal:  Lower extremities with spasticity, padding and boots in place  Skin: He is not diaphoretic.  Right ischial ulceration approx 5 cm in diameter with soft eschar; open medially and tunnelling approx 4 cm medially and inferiorly; and tunnelling approx 5 cm superiorly and laterally.  Packed with saline moistened gauze 4x4 sponges.   No significant drainage.    Assessment/Plan: Right ischial decubitus ulcer with soft eschar  Begin BID dressing changes with saline moistened gauze 4x4's  Will return Monday to debride soft eschar at bedside - does not require operative debridement  Consider consultation on Monday of physical therapy for hydrotherapy  Wound care consult reportedly placed Multiple sclerosis with spastic paraplegia Chronic kidney disease, Stage III IDDM Recurrent UTI's   Earnstine Regal, MD, [facility] Office: [place] 02/01/2016, 4:56 PM

## 2016-02-01 NOTE — Progress Notes (Signed)
PROGRESS NOTE    Lance [name]  JME:[id number] [name]: 23-Feb-1941 DOA: 01/30/2016 PCP: Pcp Not In System   Brief Narrative: Lance [name] is a 75 y.o. male with medical history significant for multiple sclerosis with spastic paraplegia, chronic kidney disease stage III, insulin-dependent diabetes mellitus, chronic Foley catheter with recurrent UTI, and initial pressure was around who presents to the emergency department at the direction of his wound care physician for evaluation of right ischial pressure ulcer with suspected secondary bacterial infection and concern for underlying osteomyelitis.    Assessment & Plan:   Principal Problem:   Cellulitis Active Problems:   Multiple sclerosis (HCC)   Sacral decubitus ulcer   Normocytic anemia   Spastic quadriplegia (HCC)   Chronic kidney disease, stage 3   Essential hypertension, benign   Urinary tract infectious disease   Decubitus ulcer of right ischial area   DM (diabetes mellitus) type II controlled with renal manifestation (HCC)   Recurrent UTI (urinary tract infection)   AKI (acute kidney injury) ([place])   Wound infection (HCC)   Necrotic unstageable pressure ulcer of the right ischium: X rays do not reveal any osteomyelitis.  Wound care consulted and recommendations given.  SURGERY consulted, recommendations given.  Air mattress ordered.  Currently on antibiotics.    Mild acute on CKD stage 3; Gentle hydration and repeat renal parameters in am.   UTI in pt with chronic foley catheter.  Cultures pending.  Resume IV antibiotics.   Insulin dependent DM; CBG (last 3)   Recent Labs  02/01/16 0758 02/01/16 1139 02/01/16 1657  GLUCAP 124* 142* 142*    Resume SSI AND PREMEAL coverage.   MS with spastic paraplegia; Stable.        DVT prophylaxis: heparin.  Code Status: full code.  Family Communication: none at bedside.  Disposition Plan: pending further evaluation.    Consultants:   Wound  care.    Procedures: none   Antimicrobials: vancomycin   Zosyn 9/29   Subjective: Reports pain is better controlled.  Objective: Vitals:   02/01/16 0521 02/01/16 1416 02/01/16 1441 02/01/16 1655  BP: (!) 141/51 130/68 136/63 (!) 151/77  Pulse: 73 70 74 76  Resp: 18 18 18 18   Temp: 98.5 F (36.9 C) 98.9 F (37.2 C) 98.7 F (37.1 C) 99 F (37.2 C)  TempSrc: Oral Oral Oral Oral  SpO2: 97% 97% 97% 97%  Weight:      Height:        Intake/Output Summary (Last 24 hours) at 02/01/16 1735 Last data filed at 02/01/16 1727  Gross per 24 hour  Intake             1360 ml  Output             1500 ml  Net             -140 ml   Filed Weights   01/30/16 1142 01/30/16 2131  Weight: 81.6 kg (180 lb) 82 kg (180 lb 12.4 oz)    Examination:  General exam: Appears calm and comfortable  Respiratory system: Clear to auscultation. Respiratory effort normal. Cardiovascular system: S1 & S2 heard, RRR. No JVD, murmurs, rubs, gallops or clicks. Gastrointestinal system: Abdomen is nondistended, soft and nontender. No organomegaly or masses felt. Normal bowel sounds heard. Central nervous system: Alert and oriented. Extremities: contracted lower extremities.  Skin: unstageable pressure ulcer ot the right ischium, with necrotic tissue.  Psychiatry: Judgement and insight appear normal. Mood & affect appropriate.  Data Reviewed: I have personally reviewed following labs and imaging studies  CBC:  Recent Labs Lab 01/30/16 1305 01/31/16 0533 02/01/16 0506  WBC 12.2* 8.3 7.4  NEUTROABS  --  6.2  --   HGB 8.8* 7.7* 7.4*  HCT 27.3* 23.3* 22.2*  MCV 92.9 92.1 89.2  PLT 335 297 784   Basic Metabolic Panel:  Recent Labs Lab 01/30/16 1305 01/31/16 0533  NA 136 140  K 4.8 4.4  CL 106 112*  CO2 21* 23  GLUCOSE 156* 143*  BUN 42* 39*  CREATININE 1.70* 1.69*  CALCIUM 8.7* 8.1*   GFR: Estimated Creatinine Clearance: 44.5 mL/min (by C-G formula based on SCr of 1.69 mg/dL  (H)). Liver Function Tests:  Recent Labs Lab 01/30/16 1305  AST 36  ALT 35  ALKPHOS 85  BILITOT 0.7  PROT 7.8  ALBUMIN 2.8*    Recent Labs Lab 01/30/16 1305  LIPASE 18   No results for input(s): AMMONIA in the last 168 hours. Coagulation Profile:  Recent Labs Lab 01/31/16 0533  INR 1.10   Cardiac Enzymes: No results for input(s): CKTOTAL, CKMB, CKMBINDEX, TROPONINI in the last 168 hours. BNP (last 3 results) No results for input(s): PROBNP in the last 8760 hours. HbA1C: No results for input(s): HGBA1C in the last 72 hours. CBG:  Recent Labs Lab 01/31/16 1702 01/31/16 2112 02/01/16 0758 02/01/16 1139 02/01/16 1657  GLUCAP 182* 122* 124* 142* 142*   Lipid Profile: No results for input(s): CHOL, HDL, LDLCALC, TRIG, CHOLHDL, LDLDIRECT in the last 72 hours. Thyroid Function Tests: No results for input(s): TSH, T4TOTAL, FREET4, T3FREE, THYROIDAB in the last 72 hours. Anemia Panel: No results for input(s): VITAMINB12, FOLATE, FERRITIN, TIBC, IRON, RETICCTPCT in the last 72 hours. Sepsis Labs:  Recent Labs Lab 01/30/16 1623  LATICACIDVEN 0.96    Recent Results (from the past 240 hour(s))  Urine culture     Status: Abnormal   Collection Time: 01/30/16 11:20 AM  Result Value Ref Range Status   Specimen Description URINE, CLEAN CATCH  Final   Special Requests NONE  Final   Culture (A)  Final    >=100,000 COLONIES/mL VIRIDANS STREPTOCOCCUS Standardized susceptibility testing for this organism is not available. >=100,000 COLONIES/mL KLEBSIELLA PNEUMONIAE    Report Status 02/01/2016 FINAL  Final   Organism ID, Bacteria KLEBSIELLA PNEUMONIAE (A)  Final      Susceptibility   Klebsiella pneumoniae - MIC*    AMPICILLIN >=32 RESISTANT Resistant     CEFAZOLIN <=4 SENSITIVE Sensitive     CEFTRIAXONE <=1 SENSITIVE Sensitive     CIPROFLOXACIN <=0.25 SENSITIVE Sensitive     GENTAMICIN <=1 SENSITIVE Sensitive     IMIPENEM <=0.25 SENSITIVE Sensitive      NITROFURANTOIN 64 INTERMEDIATE Intermediate     TRIMETH/SULFA >=320 RESISTANT Resistant     AMPICILLIN/SULBACTAM 4 SENSITIVE Sensitive     PIP/TAZO <=4 SENSITIVE Sensitive     Extended ESBL NEGATIVE Sensitive     * >=100,000 COLONIES/mL KLEBSIELLA PNEUMONIAE  Culture, blood (routine x 2)     Status: None (Preliminary result)   Collection Time: 01/30/16  4:11 PM  Result Value Ref Range Status   Specimen Description BLOOD LEFT ANTECUBITAL  Final   Special Requests BOTTLES DRAWN AEROBIC AND ANAEROBIC 5ML EA  Final   Culture   Final    NO GROWTH 2 DAYS Performed at [facility]    Report Status PENDING  Incomplete  Culture, blood (routine x 2)     Status: None (  Preliminary result)   Collection Time: 01/30/16  4:24 PM  Result Value Ref Range Status   Specimen Description BLOOD RIGHT ANTECUBITAL  Final   Special Requests BOTTLES DRAWN AEROBIC AND ANAEROBIC 5CC  Final   Culture   Final    NO GROWTH 2 DAYS Performed at [facility]    Report Status PENDING  Incomplete  Wound or Superficial Culture     Status: None (Preliminary result)   Collection Time: 01/30/16  4:58 PM  Result Value Ref Range Status   Specimen Description WOUND  Final   Special Requests DECUBITUS  Final   Gram Stain   Final    ABUNDANT WBC PRESENT,BOTH PMN AND MONONUCLEAR ABUNDANT GRAM NEGATIVE RODS ABUNDANT GRAM POSITIVE COCCI IN PAIRS Performed at [facility]    Culture FEW STAPHYLOCOCCUS AUREUS  Final   Report Status PENDING  Incomplete  MRSA PCR Screening     Status: None   Collection Time: 01/31/16  4:30 AM  Result Value Ref Range Status   MRSA by PCR NEGATIVE NEGATIVE Final    Comment:        The GeneXpert MRSA Assay (FDA approved for NASAL specimens only), is one component of a comprehensive MRSA colonization surveillance program. It is not intended to diagnose MRSA infection nor to guide or monitor treatment for MRSA infections.          Radiology Studies: Dg Hip  Unilat With Pelvis 2-3 Views Right  Result Date: 01/30/2016 CLINICAL DATA:  75 year old with approximate 6 week history of a decubitus ulcer involving the right buttock, with recent worsening of the infection. EXAM: DG HIP (WITH OR WITHOUT PELVIS) 2-3V RIGHT COMPARISON:  Right hip x-rays 05/28/2015.  MRI pelvis 09/01/2014. FINDINGS: No evidence of acute fracture or dislocation. No evidence of osteomyelitis. No visible joint effusion. Moderate joint space narrowing. Bone mineral density well-preserved. Included AP pelvis again shows the healed osteomyelitis with new bone formation at the left ischium. Contralateral left hip joint with symmetric moderate joint space narrowing. Sacroiliac joints and symphysis pubis intact. Mild degenerative changes involving the visualized lower lumbar spine. Penile prosthesis again noted. IMPRESSION: No acute osseous abnormality. No radiographic evidence of acute osteomyelitis. Electronically Signed   By: Evangeline Dakin M.D.   On: 01/30/2016 20:36        Scheduled Meds: . cholecalciferol  2,000 Units Oral Daily  . collagenase   Topical Daily  . heparin  5,000 Units Subcutaneous Q8H  . insulin aspart  0-5 Units Subcutaneous QHS  . insulin aspart  0-9 Units Subcutaneous TID WC  . insulin aspart  3 Units Subcutaneous TID WC  . insulin detemir  18 Units Subcutaneous QHS  . multivitamin with minerals  1 tablet Oral Daily  . oxybutynin  5 mg Oral BID  . piperacillin-tazobactam (ZOSYN)  IV  3.375 g Intravenous Q8H  . protein supplement shake  11 oz Oral BID BM  . simvastatin  20 mg Oral QPM  . vancomycin  1,250 mg Intravenous Q24H   Continuous Infusions:    LOS: 2 days    Time spent: 30 minutes.     Hosie Poisson, MD [facility] Pager [phone]   If 7PM-7AM, please contact night-coverage [email or web address] Password [email or web address] 02/01/2016, 5:35 PM

## 2016-02-02 LAB — VANCOMYCIN, TROUGH: Vancomycin Tr: 24 ug/mL (ref 15–20)

## 2016-02-02 LAB — TYPE AND SCREEN
ABO/RH(D): A POS
Antibody Screen: NEGATIVE
Unit division: 0

## 2016-02-02 LAB — BASIC METABOLIC PANEL
Anion gap: 5 (ref 5–15)
BUN: 38 mg/dL — AB (ref 6–20)
CO2: 23 mmol/L (ref 22–32)
CREATININE: 1.75 mg/dL — AB (ref 0.61–1.24)
Calcium: 8.3 mg/dL — ABNORMAL LOW (ref 8.9–10.3)
Chloride: 113 mmol/L — ABNORMAL HIGH (ref 101–111)
GFR calc Af Amer: 42 mL/min — ABNORMAL LOW (ref >60)
GFR, EST NON AFRICAN AMERICAN: 37 mL/min — AB (ref >60)
GLUCOSE: 151 mg/dL — AB (ref 65–99)
POTASSIUM: 4.6 mmol/L (ref 3.5–5.1)
Sodium: 141 mmol/L (ref 135–145)

## 2016-02-02 LAB — GLUCOSE, CAPILLARY
GLUCOSE-CAPILLARY: 125 mg/dL — AB (ref 65–99)
GLUCOSE-CAPILLARY: 146 mg/dL — AB (ref 65–99)
Glucose-Capillary: 88 mg/dL (ref 65–99)
Glucose-Capillary: 128 mg/dL — ABNORMAL HIGH (ref 65–99)

## 2016-02-02 LAB — HEMOGLOBIN AND HEMATOCRIT, BLOOD
HCT: 25.1 % — ABNORMAL LOW (ref 39.0–52.0)
Hemoglobin: 8.3 g/dL — ABNORMAL LOW (ref 13.0–17.0)

## 2016-02-02 NOTE — Progress Notes (Signed)
PROGRESS NOTE    Lance [name]  TKZ:[id number] [name]: [birth year] DOA: 01/30/2016 PCP: Pcp Not In System   Brief Narrative: Lance [name] is a 75 y.o. male with medical history significant for multiple sclerosis with spastic paraplegia, chronic kidney disease stage III, insulin-dependent diabetes mellitus, chronic Foley catheter with recurrent UTI, and initial pressure was around who presents to the emergency department at the direction of his wound care physician for evaluation of right ischial pressure ulcer with suspected secondary bacterial infection and concern for underlying osteomyelitis.    Assessment & Plan:   Principal Problem:   Cellulitis Active Problems:   Multiple sclerosis (HCC)   Sacral decubitus ulcer   Normocytic anemia   Spastic quadriplegia (HCC)   Chronic kidney disease, stage 3   Essential hypertension, benign   Urinary tract infectious disease   Decubitus ulcer of right ischial area   DM (diabetes mellitus) type II controlled with renal manifestation (HCC)   Recurrent UTI (urinary tract infection)   AKI (acute kidney injury) ([place])   Wound infection   Necrotic unstageable pressure ulcer of the right ischium: X rays do not reveal any osteomyelitis.  Wound care consulted and recommendations given.  SURGERY consulted, recommendations given. Plan for bedside debridement and PT for  hydrotherapy requested.  Air mattress ordered.  Currently on antibiotics.  Pt requests to see if he can be transferred to Dr Luetta Nutting at [facility] tomorrow after the bedside debridement.  Cultures from the decubitus ulcer show MRSA.    Mild acute on CKD stage 3; Gentle hydration and repeat renal parameters in am remain stable. Marland Kitchen   UTI in pt with chronic foley catheter.  Cultures grew klebsiella and streptococcus.  Resume IV antibiotics.   Insulin dependent DM; CBG (last 3)   Recent Labs  02/01/16 2205 02/02/16 0832 02/02/16 1215  GLUCAP 149* 88 125*     Resume SSI AND PREMEAL coverage.   MS with spastic paraplegia; Stable.    Anemia: - 1 unit of prbc transfusion.  - repeat H&H ordered tonight.    DVT prophylaxis: heparin.  Code Status: full code.  Family Communication: none at bedside.  Disposition Plan: pending further evaluation.    Consultants:   Wound care.    Procedures: none   Antimicrobials: vancomycin   Zosyn 9/29   Subjective: Reports pain is better controlled.  Objective: Vitals:   02/01/16 1655 02/01/16 2100 02/02/16 0500 02/02/16 1300  BP: (!) 151/77 (!) 162/64 (!) 129/51 131/71  Pulse: 76 78 83 79  Resp: 18 16 18 18   Temp: 99 F (37.2 C) 98.7 F (37.1 C) 98.5 F (36.9 C) 98.4 F (36.9 C)  TempSrc: Oral Oral Oral Oral  SpO2: 97% 94% 92% 95%  Weight:      Height:        Intake/Output Summary (Last 24 hours) at 02/02/16 1605 Last data filed at 02/02/16 1347  Gross per 24 hour  Intake             2830 ml  Output              925 ml  Net             1905 ml   Filed Weights   01/30/16 1142 01/30/16 2131  Weight: 81.6 kg (180 lb) 82 kg (180 lb 12.4 oz)    Examination:  General exam: Appears calm and comfortable  Respiratory system: Clear to auscultation. Respiratory effort normal. Cardiovascular system: S1 & S2 heard,  RRR. No JVD, murmurs, rubs, gallops or clicks. Gastrointestinal system: Abdomen is nondistended, soft and nontender. No organomegaly or masses felt. Normal bowel sounds heard. Central nervous system: Alert and oriented. Extremities: contracted lower extremities.  Skin: unstageable pressure ulcer ot the right ischium, with necrotic tissue.  Psychiatry: Judgement and insight appear normal. Mood & affect appropriate.     Data Reviewed: I have personally reviewed following labs and imaging studies  CBC:  Recent Labs Lab 01/30/16 1305 01/31/16 0533 02/01/16 0506  WBC 12.2* 8.3 7.4  NEUTROABS  --  6.2  --   HGB 8.8* 7.7* 7.4*  HCT 27.3* 23.3* 22.2*  MCV 92.9  92.1 89.2  PLT 335 297 001   Basic Metabolic Panel:  Recent Labs Lab 01/30/16 1305 01/31/16 0533 02/02/16 0508  NA 136 140 141  K 4.8 4.4 4.6  CL 106 112* 113*  CO2 21* 23 23  GLUCOSE 156* 143* 151*  BUN 42* 39* 38*  CREATININE 1.70* 1.69* 1.75*  CALCIUM 8.7* 8.1* 8.3*   GFR: Estimated Creatinine Clearance: 43 mL/min (by C-G formula based on SCr of 1.75 mg/dL (H)). Liver Function Tests:  Recent Labs Lab 01/30/16 1305  AST 36  ALT 35  ALKPHOS 85  BILITOT 0.7  PROT 7.8  ALBUMIN 2.8*    Recent Labs Lab 01/30/16 1305  LIPASE 18   No results for input(s): AMMONIA in the last 168 hours. Coagulation Profile:  Recent Labs Lab 01/31/16 0533  INR 1.10   Cardiac Enzymes: No results for input(s): CKTOTAL, CKMB, CKMBINDEX, TROPONINI in the last 168 hours. BNP (last 3 results) No results for input(s): PROBNP in the last 8760 hours. HbA1C: No results for input(s): HGBA1C in the last 72 hours. CBG:  Recent Labs Lab 02/01/16 1139 02/01/16 1657 02/01/16 2205 02/02/16 0832 02/02/16 1215  GLUCAP 142* 142* 149* 88 125*   Lipid Profile: No results for input(s): CHOL, HDL, LDLCALC, TRIG, CHOLHDL, LDLDIRECT in the last 72 hours. Thyroid Function Tests: No results for input(s): TSH, T4TOTAL, FREET4, T3FREE, THYROIDAB in the last 72 hours. Anemia Panel: No results for input(s): VITAMINB12, FOLATE, FERRITIN, TIBC, IRON, RETICCTPCT in the last 72 hours. Sepsis Labs:  Recent Labs Lab 01/30/16 1623  LATICACIDVEN 0.96    Recent Results (from the past 240 hour(s))  Urine culture     Status: Abnormal   Collection Time: 01/30/16 11:20 AM  Result Value Ref Range Status   Specimen Description URINE, CLEAN CATCH  Final   Special Requests NONE  Final   Culture (A)  Final    >=100,000 COLONIES/mL VIRIDANS STREPTOCOCCUS Standardized susceptibility testing for this organism is not available. >=100,000 COLONIES/mL KLEBSIELLA PNEUMONIAE    Report Status 02/01/2016 FINAL   Final   Organism ID, Bacteria KLEBSIELLA PNEUMONIAE (A)  Final      Susceptibility   Klebsiella pneumoniae - MIC*    AMPICILLIN >=32 RESISTANT Resistant     CEFAZOLIN <=4 SENSITIVE Sensitive     CEFTRIAXONE <=1 SENSITIVE Sensitive     CIPROFLOXACIN <=0.25 SENSITIVE Sensitive     GENTAMICIN <=1 SENSITIVE Sensitive     IMIPENEM <=0.25 SENSITIVE Sensitive     NITROFURANTOIN 64 INTERMEDIATE Intermediate     TRIMETH/SULFA >=320 RESISTANT Resistant     AMPICILLIN/SULBACTAM 4 SENSITIVE Sensitive     PIP/TAZO <=4 SENSITIVE Sensitive     Extended ESBL NEGATIVE Sensitive     * >=100,000 COLONIES/mL KLEBSIELLA PNEUMONIAE  Culture, blood (routine x 2)     Status: None (Preliminary result)  Collection Time: 01/30/16  4:11 PM  Result Value Ref Range Status   Specimen Description BLOOD LEFT ANTECUBITAL  Final   Special Requests BOTTLES DRAWN AEROBIC AND ANAEROBIC 5ML EA  Final   Culture   Final    NO GROWTH 2 DAYS Performed at [facility]    Report Status PENDING  Incomplete  Culture, blood (routine x 2)     Status: None (Preliminary result)   Collection Time: 01/30/16  4:24 PM  Result Value Ref Range Status   Specimen Description BLOOD RIGHT ANTECUBITAL  Final   Special Requests BOTTLES DRAWN AEROBIC AND ANAEROBIC 5CC  Final   Culture   Final    NO GROWTH 2 DAYS Performed at [facility]    Report Status PENDING  Incomplete  Wound or Superficial Culture     Status: None (Preliminary result)   Collection Time: 01/30/16  4:58 PM  Result Value Ref Range Status   Specimen Description WOUND  Final   Special Requests DECUBITUS  Final   Gram Stain   Final    ABUNDANT WBC PRESENT,BOTH PMN AND MONONUCLEAR ABUNDANT GRAM NEGATIVE RODS ABUNDANT GRAM POSITIVE COCCI IN PAIRS    Culture   Final    FEW METHICILLIN RESISTANT STAPHYLOCOCCUS AUREUS FEW ENTEROCOCCUS FAECALIS SUSCEPTIBILITIES TO FOLLOW Performed at [facility]    Report Status PENDING  Incomplete   Organism  ID, Bacteria METHICILLIN RESISTANT STAPHYLOCOCCUS AUREUS  Final      Susceptibility   Methicillin resistant staphylococcus aureus - MIC*    CIPROFLOXACIN >=8 RESISTANT Resistant     ERYTHROMYCIN >=8 RESISTANT Resistant     GENTAMICIN <=0.5 SENSITIVE Sensitive     OXACILLIN >=4 RESISTANT Resistant     TETRACYCLINE <=1 SENSITIVE Sensitive     VANCOMYCIN 1 SENSITIVE Sensitive     TRIMETH/SULFA >=320 RESISTANT Resistant     CLINDAMYCIN <=0.25 SENSITIVE Sensitive     RIFAMPIN <=0.5 SENSITIVE Sensitive     Inducible Clindamycin NEGATIVE Sensitive     * FEW METHICILLIN RESISTANT STAPHYLOCOCCUS AUREUS  MRSA PCR Screening     Status: None   Collection Time: 01/31/16  4:30 AM  Result Value Ref Range Status   MRSA by PCR NEGATIVE NEGATIVE Final    Comment:        The GeneXpert MRSA Assay (FDA approved for NASAL specimens only), is one component of a comprehensive MRSA colonization surveillance program. It is not intended to diagnose MRSA infection nor to guide or monitor treatment for MRSA infections.          Radiology Studies: No results found.      Scheduled Meds: . cholecalciferol  2,000 Units Oral Daily  . collagenase   Topical Daily  . heparin  5,000 Units Subcutaneous Q8H  . insulin aspart  0-5 Units Subcutaneous QHS  . insulin aspart  0-9 Units Subcutaneous TID WC  . insulin aspart  3 Units Subcutaneous TID WC  . insulin detemir  18 Units Subcutaneous QHS  . multivitamin with minerals  1 tablet Oral Daily  . oxybutynin  5 mg Oral BID  . piperacillin-tazobactam (ZOSYN)  IV  3.375 g Intravenous Q8H  . protein supplement shake  11 oz Oral BID BM  . simvastatin  20 mg Oral QPM  . vancomycin  1,250 mg Intravenous Q24H   Continuous Infusions:    LOS: 3 days    Time spent: 30 minutes.     Hosie Poisson, MD [facility] Pager [phone]   If 7PM-7AM, please  contact night-coverage [email or web address] Password [email or web address] 02/02/2016, 4:05 PM

## 2016-02-02 NOTE — Progress Notes (Signed)
Pharmacy Antibiotic Note  Lance [name] is a 75 y.o. male admitted on 01/30/2016 with suspected UTI and infected right ischial wound.  Pharmacy has been consulted for vancomycin and zosyn dosing.  Plan:  Continue Zosyn 3.375g IV Q8H infused over 4hrs. Hold vancomycin 10/1 due to elevated level. Recheck random vancomycin level on 10/2 with AM labs. Measure Vanc trough at steady state.  Goal VT 15-20 mcg/mL Follow up renal fxn, culture results, and clinical course.  Height: 6\' 4"  (193 cm) Weight: 180 lb 12.4 oz (82 kg) IBW/kg (Calculated) : 86.8  Temp (24hrs), Avg:98.8 F (37.1 C), Min:98.5 F (36.9 C), Max:99 F (37.2 C)   Recent Labs Lab 01/30/16 1305 01/30/16 1623 01/31/16 0533 02/01/16 0506 02/02/16 0508  WBC 12.2*  --  8.3 7.4  --   CREATININE 1.70*  --  1.69*  --  1.75*  LATICACIDVEN  --  0.96  --   --   --     Estimated Creatinine Clearance: 43 mL/min (by C-G formula based on SCr of 1.75 mg/dL (H)).   CrCl ~ 37 ml/min/1.50m2 (normalized)  No Known Allergies  Antimicrobials this admission:  9/28 Vancomycin >> 9/28 Zosyn >>  Dose adjustments this admission:  10/1 1500 VT = 24 on vanc 1250 q24h 10/2 Vanc random level = _____  Microbiology results:  Previous cxt 09/05/14 L ischial bone: few MRSA 9/28 BCx: ngtd 9/28 UCx: > 100k Viridans Strep, Klebsiella pneumo (pan-sens except ampicillin, ntf, bactrim) 9/28 Wound (superficial): MRSA (sens vanc) 9/29 MRSA PCR: negative  Thank you for allowing pharmacy to be a part of this patient's care.  Gretta Arab PharmD, BCPS Pager [phone] 02/02/2016 12:37 PM

## 2016-02-02 NOTE — Progress Notes (Signed)
PT Cancellation Note  Patient Details Name: Lance [name] MRN: [id number] [name]: 1940-09-13   Cancelled Treatment:    Reason Eval/Treat Not Completed: PT screened, no needs identified, will sign off (patient currently has an ischial wound, surgery following.  Currently patient assisting with rolling per nursing. No acute PT needs for mobility as patient should remain on air mattress and not be sitting in chair. )   [name] 02/02/2016, 12:10 PM [name] PT [phone]

## 2016-02-02 NOTE — Progress Notes (Signed)
CRITICAL VALUE ALERT  Critical value received:  Vanco trough 24  Date of notification:  02/02/16  Time of notification: 1602  Critical value read back:Yes.    Nurse who received alert:  G. Gerilyn Nestle, RN  MD notified (1st page):  Gretta Arab, pharmacist  Time of first phone call: 1604  MD notified (2nd page): n/a   Time of second page: n/a  Responding MD: Gretta Arab, pharmacist  Time MD responded:  [phone]

## 2016-02-03 LAB — AEROBIC CULTURE  (SUPERFICIAL SPECIMEN)

## 2016-02-03 LAB — GLUCOSE, CAPILLARY
GLUCOSE-CAPILLARY: 146 mg/dL — AB (ref 65–99)
Glucose-Capillary: 104 mg/dL — ABNORMAL HIGH (ref 65–99)
Glucose-Capillary: 222 mg/dL — ABNORMAL HIGH (ref 65–99)
Glucose-Capillary: 105 mg/dL — ABNORMAL HIGH (ref 65–99)

## 2016-02-03 LAB — VANCOMYCIN, RANDOM: Vancomycin Rm: 19

## 2016-02-03 LAB — AEROBIC CULTURE W GRAM STAIN (SUPERFICIAL SPECIMEN)

## 2016-02-03 MED ORDER — CHLORHEXIDINE GLUCONATE CLOTH 2 % EX PADS: 6.0000 | MEDICATED_PAD | Freq: Every day | CUTANEOUS | Status: DC

## 2016-02-03 MED ORDER — VANCOMYCIN HCL IN DEXTROSE 1-5 GM/200ML-% IV SOLN
1000.0000 mg | INTRAVENOUS | Status: DC
  Administered 2016-02-03 – 2016-02-04 (×2): 1000 mg via INTRAVENOUS
  Filled 2016-02-03 (×3): qty 200

## 2016-02-03 MED ORDER — MUPIROCIN 2 % EX OINT: 1.0000 "application " | TOPICAL_OINTMENT | Freq: Two times a day (BID) | CUTANEOUS | Status: DC

## 2016-02-03 NOTE — Progress Notes (Signed)
PT Cancellation Note  Patient Details Name: Lance [name] MRN: [id number] [name]: Sep 18, 1940   Cancelled Treatment:    Reason Eval/Treat Not Completed:  Per notes, surgery planning to do bedside debridement on today. Will hold PT today and begin hydrotherapy on tomorrow. Thanks.   Weston Anna, MPT Pager: [phone]

## 2016-02-03 NOTE — Progress Notes (Signed)
[facility] Progress Note     Subjective: NAE. Denies pain. Tolerating diet. + flatus. + BMs  Objective: Vital signs in last 24 hours: Temp:  [98.2 F (36.8 C)-98.4 F (36.9 C)] 98.4 F (36.9 C) (10/02 0500) Pulse Rate:  [72-84] 84 (10/02 0500) Resp:  [16-18] 16 (10/02 0500) BP: (131-153)/(60-71) 153/62 (10/02 0500) SpO2:  [94 %-97 %] 94 % (10/02 0500) Last BM Date: 02/02/16  Intake/Output from previous day: 10/01 0701 - 10/02 0700 In: 1830 [P.O.:1680; IV Piggyback:150] Out: 900 [Urine:900] Intake/Output this shift: Total I/O In: 320 [P.O.:320] Out: -   PE: Gen:  Alert, NAD, pleasant  Pulm:  CTA, no W/R/R Abd: Soft, NT/ND, +BS Skin: exam if ischial wound deferred - will debride wound this afternoon   Lab Results:   Recent Labs  02/01/16 0506 02/02/16 1924  WBC 7.4  --   HGB 7.4* 8.3*  HCT 22.2* 25.1*  PLT 297  --    BMET  Recent Labs  02/02/16 0508  NA 141  K 4.6  CL 113*  CO2 23  GLUCOSE 151*  BUN 38*  CREATININE 1.75*  CALCIUM 8.3*   CMP     Component Value Date/Time   NA 141 02/02/2016 0508   NA 140 08/12/2015   K 4.6 02/02/2016 0508   CL 113 (H) 02/02/2016 0508   CO2 23 02/02/2016 0508   GLUCOSE 151 (H) 02/02/2016 0508   BUN 38 (H) 02/02/2016 0508   BUN 31 (A) 08/12/2015   CREATININE 1.75 (H) 02/02/2016 0508   CALCIUM 8.3 (L) 02/02/2016 0508   PROT 7.8 01/30/2016 1305   ALBUMIN 2.8 (L) 01/30/2016 1305   AST 36 01/30/2016 1305   ALT 35 01/30/2016 1305   ALKPHOS 85 01/30/2016 1305   BILITOT 0.7 01/30/2016 1305   GFRNONAA 37 (L) 02/02/2016 0508   GFRAA 42 (L) 02/02/2016 0508   Lipase     Component Value Date/Time   LIPASE 18 01/30/2016 1305   Anti-infectives: Anti-infectives    Start     Dose/Rate Route Frequency Ordered Stop   02/03/16 0800  vancomycin (VANCOCIN) IVPB 1000 mg/200 mL premix     1,000 mg 200 mL/hr over 60 Minutes Intravenous Every 36 hours 02/03/16 0725     01/31/16 1600  vancomycin (VANCOCIN)  1,250 mg in sodium chloride 0.9 % 250 mL IVPB  Status:  Discontinued     1,250 mg 166.7 mL/hr over 90 Minutes Intravenous Every 24 hours 01/30/16 1639 02/02/16 1627   01/31/16 0000  piperacillin-tazobactam (ZOSYN) IVPB 3.375 g     3.375 g 12.5 mL/hr over 240 Minutes Intravenous Every 8 hours 01/30/16 1640     01/30/16 1645  vancomycin (VANCOCIN) 1,250 mg in sodium chloride 0.9 % 250 mL IVPB     1,250 mg 166.7 mL/hr over 90 Minutes Intravenous  Once 01/30/16 1632 01/30/16 1915   01/30/16 1645  piperacillin-tazobactam (ZOSYN) IVPB 3.375 g     3.375 g 100 mL/hr over 30 Minutes Intravenous  Once 01/30/16 1632 01/30/16 1738     Assessment/Plan Right ischial decubitus ulcer PMH decubitus ulcers with flap closure at [facility] - BID wet-to-dry dressing changes - Debride at bedside today  Multiple Sclerosis with spastic paraplegia CKD, Stage 3 IDDM Recurrent UTI's  Plan: bedside debridement this afternoon Continue local wound care   LOS: 4 days    Jill Alexanders , [facility] Surgery 02/03/2016, 11:18 AM Pager: [phone] Consults: [phone] Mon-Fri 7:00 am-4:30 pm Sat-Sun 7:00 am-11:30  am

## 2016-02-03 NOTE — Progress Notes (Signed)
Pharmacy Antibiotic Note  Lance [name] is a 75 y.o. male admitted on 01/30/2016 with suspected UTI and infected right ischial wound.  Pharmacy has been consulted for vancomycin and zosyn dosing.  Plan:  Continue Zosyn 3.375g IV Q8H infused over 4hrs. 36 hour random vanc level= 19 Resume vancomycin at 1gm IV q36h Follow up renal fxn, culture results, and clinical course.  Height: 6\' 4"  (193 cm) Weight: 180 lb 12.4 oz (82 kg) IBW/kg (Calculated) : 86.8  Temp (24hrs), Avg:98.3 F (36.8 C), Min:98.2 F (36.8 C), Max:98.4 F (36.9 C)   Recent Labs Lab 01/30/16 1305 01/30/16 1623 01/31/16 0533 02/01/16 0506 02/02/16 0508 02/02/16 1528 02/03/16 0340  WBC 12.2*  --  8.3 7.4  --   --   --   CREATININE 1.70*  --  1.69*  --  1.75*  --   --   LATICACIDVEN  --  0.96  --   --   --   --   --   VANCOTROUGH  --   --   --   --   --  24*  --   VANCORANDOM  --   --   --   --   --   --  19    Estimated Creatinine Clearance: 43 mL/min (by C-G formula based on SCr of 1.75 mg/dL (H)).   CrCl ~ 37 ml/min/1.43m2 (normalized)  No Known Allergies  Antimicrobials this admission:  9/28 Vancomycin >> 9/28 Zosyn >>  Dose adjustments this admission:  10/1 1500 VT = 24 on vanc 1250 q24h 10/2 Vanc random level = 19 ~ 36 hour level on 1250mg   Microbiology results:  Previous cxt 09/05/14 L ischial bone: few MRSA 9/28 BCx: ngtd 9/28 UCx: > 100k Viridans Strep, Klebsiella pneumo (pan-sens except ampicillin, ntf, bactrim) 9/28 Wound (superficial): MRSA (sens vanc) 9/29 MRSA PCR: negative  Thank you for allowing pharmacy to be a part of this patient's care.  Dolly Rias RPh 02/03/2016, 7:28 AM Pager [phone]

## 2016-02-03 NOTE — Progress Notes (Signed)
PROGRESS NOTE    Lance [name]  OMV:[id number] [name]: 1940-09-23 DOA: 01/30/2016 PCP: Pcp Not In System   Brief Narrative: Lance [name] is a 75 y.o. male with medical history significant for multiple sclerosis with spastic paraplegia, chronic kidney disease stage III, insulin-dependent diabetes mellitus, chronic Foley catheter with recurrent UTI, and initial pressure was around who presents to the emergency department at the direction of his wound care physician for evaluation of right ischial pressure ulcer with suspected secondary bacterial infection and concern for underlying osteomyelitis.    Assessment & Plan:   Principal Problem:   Cellulitis Active Problems:   Multiple sclerosis (HCC)   Sacral decubitus ulcer   Normocytic anemia   Spastic quadriplegia (HCC)   Chronic kidney disease, stage 3   Essential hypertension, benign   Urinary tract infectious disease   Decubitus ulcer of right ischial area   DM (diabetes mellitus) type II controlled with renal manifestation (HCC)   Recurrent UTI (urinary tract infection)   AKI (acute kidney injury) ([place])   Wound infection   Necrotic unstageable pressure ulcer of the right ischium: X rays do not reveal any osteomyelitis.  Wound care consulted and recommendations given.  SURGERY consulted, recommendations given. Underwent  bedside debridement and PT for  hydrotherapy requested.  Air mattress ordered.  Currently on antibiotics.  Pt requests to see if he can be transferred to Dr Luetta Nutting at [facility]  after the bedside debridement. Discussed with Dr Luetta Nutting recommended outpatient follow up on discharge. Cultures from the decubitus ulcer show MRSA.    Mild acute on CKD stage 3; Gentle hydration and repeat renal parameters in am remain stable. Marland Kitchen   UTI in pt with chronic foley catheter.  Cultures grew klebsiella and streptococcus.  Resume IV antibiotics.   Insulin dependent DM; CBG (last 3)   Recent Labs   02/03/16 0814 02/03/16 1155 02/03/16 1726  GLUCAP 104* 222* 105*    Resume SSI AND PREMEAL coverage.   MS with spastic paraplegia; Stable.    Anemia: - 1 unit of prbc transfusion.  - repeat H&H ordered is stable around 8.     DVT prophylaxis: heparin.  Code Status: full code.  Family Communication: none at bedside.  Disposition Plan: pending further evaluation.    Consultants:   Wound care.   Surgery.    Procedures: bedside debridement.    Antimicrobials: vancomycin   Zosyn 9/29   Subjective: Reports pain is better controlled.  Objective: Vitals:   02/02/16 1300 02/02/16 2126 02/03/16 0500 02/03/16 1349  BP: 131/71 (!) 150/60 (!) 153/62 138/61  Pulse: 79 72 84 75  Resp: 18 18 16 18   Temp: 98.4 F (36.9 C) 98.2 F (36.8 C) 98.4 F (36.9 C) 97.8 F (36.6 C)  TempSrc: Oral Oral Oral Oral  SpO2: 95% 97% 94% 96%  Weight:      Height:        Intake/Output Summary (Last 24 hours) at 02/03/16 1825 Last data filed at 02/03/16 1730  Gross per 24 hour  Intake             2440 ml  Output             1050 ml  Net             1390 ml   Filed Weights   01/30/16 1142 01/30/16 2131  Weight: 81.6 kg (180 lb) 82 kg (180 lb 12.4 oz)    Examination:  General exam: Appears calm and  comfortable  Respiratory system: Clear to auscultation. Respiratory effort normal. Cardiovascular system: S1 & S2 heard, RRR. No JVD, murmurs, rubs, gallops or clicks. Gastrointestinal system: Abdomen is nondistended, soft and nontender. No organomegaly or masses felt. Normal bowel sounds heard. Central nervous system: Alert and oriented. Extremities: contracted lower extremities.  Skin: unstageable pressure ulcer ot the right ischium, with necrotic tissue.  Psychiatry: Judgement and insight appear normal. Mood & affect appropriate.     Data Reviewed: I have personally reviewed following labs and imaging studies  CBC:  Recent Labs Lab 01/30/16 1305 01/31/16 0533  02/01/16 0506 02/02/16 1924  WBC 12.2* 8.3 7.4  --   NEUTROABS  --  6.2  --   --   HGB 8.8* 7.7* 7.4* 8.3*  HCT 27.3* 23.3* 22.2* 25.1*  MCV 92.9 92.1 89.2  --   PLT 335 297 297  --    Basic Metabolic Panel:  Recent Labs Lab 01/30/16 1305 01/31/16 0533 02/02/16 0508  NA 136 140 141  K 4.8 4.4 4.6  CL 106 112* 113*  CO2 21* 23 23  GLUCOSE 156* 143* 151*  BUN 42* 39* 38*  CREATININE 1.70* 1.69* 1.75*  CALCIUM 8.7* 8.1* 8.3*   GFR: Estimated Creatinine Clearance: 43 mL/min (by C-G formula based on SCr of 1.75 mg/dL (H)). Liver Function Tests:  Recent Labs Lab 01/30/16 1305  AST 36  ALT 35  ALKPHOS 85  BILITOT 0.7  PROT 7.8  ALBUMIN 2.8*    Recent Labs Lab 01/30/16 1305  LIPASE 18   No results for input(s): AMMONIA in the last 168 hours. Coagulation Profile:  Recent Labs Lab 01/31/16 0533  INR 1.10   Cardiac Enzymes: No results for input(s): CKTOTAL, CKMB, CKMBINDEX, TROPONINI in the last 168 hours. BNP (last 3 results) No results for input(s): PROBNP in the last 8760 hours. HbA1C: No results for input(s): HGBA1C in the last 72 hours. CBG:  Recent Labs Lab 02/02/16 1722 02/02/16 2207 02/03/16 0814 02/03/16 1155 02/03/16 1726  GLUCAP 128* 146* 104* 222* 105*   Lipid Profile: No results for input(s): CHOL, HDL, LDLCALC, TRIG, CHOLHDL, LDLDIRECT in the last 72 hours. Thyroid Function Tests: No results for input(s): TSH, T4TOTAL, FREET4, T3FREE, THYROIDAB in the last 72 hours. Anemia Panel: No results for input(s): VITAMINB12, FOLATE, FERRITIN, TIBC, IRON, RETICCTPCT in the last 72 hours. Sepsis Labs:  Recent Labs Lab 01/30/16 1623  LATICACIDVEN 0.96    Recent Results (from the past 240 hour(s))  Urine culture     Status: Abnormal   Collection Time: 01/30/16 11:20 AM  Result Value Ref Range Status   Specimen Description URINE, CLEAN CATCH  Final   Special Requests NONE  Final   Culture (A)  Final    >=100,000 COLONIES/mL VIRIDANS  STREPTOCOCCUS Standardized susceptibility testing for this organism is not available. >=100,000 COLONIES/mL KLEBSIELLA PNEUMONIAE    Report Status 02/01/2016 FINAL  Final   Organism ID, Bacteria KLEBSIELLA PNEUMONIAE (A)  Final      Susceptibility   Klebsiella pneumoniae - MIC*    AMPICILLIN >=32 RESISTANT Resistant     CEFAZOLIN <=4 SENSITIVE Sensitive     CEFTRIAXONE <=1 SENSITIVE Sensitive     CIPROFLOXACIN <=0.25 SENSITIVE Sensitive     GENTAMICIN <=1 SENSITIVE Sensitive     IMIPENEM <=0.25 SENSITIVE Sensitive     NITROFURANTOIN 64 INTERMEDIATE Intermediate     TRIMETH/SULFA >=320 RESISTANT Resistant     AMPICILLIN/SULBACTAM 4 SENSITIVE Sensitive     PIP/TAZO <=4 SENSITIVE Sensitive  Extended ESBL NEGATIVE Sensitive     * >=100,000 COLONIES/mL KLEBSIELLA PNEUMONIAE  Culture, blood (routine x 2)     Status: None (Preliminary result)   Collection Time: 01/30/16  4:11 PM  Result Value Ref Range Status   Specimen Description BLOOD LEFT ANTECUBITAL  Final   Special Requests BOTTLES DRAWN AEROBIC AND ANAEROBIC 5ML EA  Final   Culture   Final    NO GROWTH 4 DAYS Performed at [facility]    Report Status PENDING  Incomplete  Culture, blood (routine x 2)     Status: None (Preliminary result)   Collection Time: 01/30/16  4:24 PM  Result Value Ref Range Status   Specimen Description BLOOD RIGHT ANTECUBITAL  Final   Special Requests BOTTLES DRAWN AEROBIC AND ANAEROBIC 5CC  Final   Culture   Final    NO GROWTH 4 DAYS Performed at [facility]    Report Status PENDING  Incomplete  Wound or Superficial Culture     Status: None   Collection Time: 01/30/16  4:58 PM  Result Value Ref Range Status   Specimen Description WOUND  Final   Special Requests DECUBITUS  Final   Gram Stain   Final    ABUNDANT WBC PRESENT,BOTH PMN AND MONONUCLEAR ABUNDANT GRAM NEGATIVE RODS ABUNDANT GRAM POSITIVE COCCI IN PAIRS Performed at [facility]    Culture   Final    FEW  METHICILLIN RESISTANT STAPHYLOCOCCUS AUREUS FEW ENTEROCOCCUS FAECALIS    Report Status 02/03/2016 FINAL  Final   Organism ID, Bacteria METHICILLIN RESISTANT STAPHYLOCOCCUS AUREUS  Final   Organism ID, Bacteria ENTEROCOCCUS FAECALIS  Final      Susceptibility   Enterococcus faecalis - MIC*    AMPICILLIN <=2 SENSITIVE Sensitive     VANCOMYCIN 1 SENSITIVE Sensitive     GENTAMICIN SYNERGY SENSITIVE Sensitive     * FEW ENTEROCOCCUS FAECALIS   Methicillin resistant staphylococcus aureus - MIC*    CIPROFLOXACIN >=8 RESISTANT Resistant     ERYTHROMYCIN >=8 RESISTANT Resistant     GENTAMICIN <=0.5 SENSITIVE Sensitive     OXACILLIN >=4 RESISTANT Resistant     TETRACYCLINE <=1 SENSITIVE Sensitive     VANCOMYCIN 1 SENSITIVE Sensitive     TRIMETH/SULFA >=320 RESISTANT Resistant     CLINDAMYCIN <=0.25 SENSITIVE Sensitive     RIFAMPIN <=0.5 SENSITIVE Sensitive     Inducible Clindamycin NEGATIVE Sensitive     * FEW METHICILLIN RESISTANT STAPHYLOCOCCUS AUREUS  MRSA PCR Screening     Status: None   Collection Time: 01/31/16  4:30 AM  Result Value Ref Range Status   MRSA by PCR NEGATIVE NEGATIVE Final    Comment:        The GeneXpert MRSA Assay (FDA approved for NASAL specimens only), is one component of a comprehensive MRSA colonization surveillance program. It is not intended to diagnose MRSA infection nor to guide or monitor treatment for MRSA infections.          Radiology Studies: No results found.      Scheduled Meds: . cholecalciferol  2,000 Units Oral Daily  . collagenase   Topical Daily  . heparin  5,000 Units Subcutaneous Q8H  . insulin aspart  0-5 Units Subcutaneous QHS  . insulin aspart  0-9 Units Subcutaneous TID WC  . insulin aspart  3 Units Subcutaneous TID WC  . insulin detemir  18 Units Subcutaneous QHS  . multivitamin with minerals  1 tablet Oral Daily  . oxybutynin  5 mg Oral  BID  . piperacillin-tazobactam (ZOSYN)  IV  3.375 g Intravenous Q8H  . protein  supplement shake  11 oz Oral BID BM  . simvastatin  20 mg Oral QPM  . vancomycin  1,000 mg Intravenous Q36H   Continuous Infusions:    LOS: 4 days    Time spent: 30 minutes.     Hosie Poisson, MD [facility] Pager [phone]   If 7PM-7AM, please contact night-coverage [email or web address] Password [facility] 02/03/2016, 6:25 PM

## 2016-02-03 NOTE — Progress Notes (Signed)
Progress Note  Patient comfortable.  Bedside debridement of right ischial ulcer  Before: moderate amount of slough and necrotic tissue of medial aspect of ulcer. wound tracking superiorly and laterally about ~5-7 cm   After: necrotic tissue debrided without complication.  Still with a moderate amount of yellow slough.   Continue BID wet-to-dry dressing changes.   Lance Dredge, PA-C [facility] Pager: [phone] Consults: [phone] Mon-Fri 7:00 am-4:30 pm Sat-Sun 7:00 am-11:30 am

## 2016-02-03 NOTE — Care Management Note (Signed)
Case Management Note  Patient Details  Name: Lance [name] MRN: [id number] Date of Birth: 16-Jul-1940  Subjective/Objective:  Active w/[facility] nursing-wound care-await HHC orders. S/p debridement.PT-hydrotherapy to start in am.iv abx.                  Action/Plan:d/c plan home w/HHC.   Expected Discharge Date:   (unknown)               Expected Discharge Plan:  [place]  In-House Referral:     Discharge planning Services  CM Consult  Post Acute Care Choice:  Home Health [facility] active w/HHRN) Choice offered to:     DME Arranged:    DME Agency:     HH Arranged:    HH Agency:     Status of Service:  In process, will continue to follow  If discussed at Long Length of Stay Meetings, dates discussed:    Additional Comments:  Dessa Phi, RN 02/03/2016, 12:45 PM

## 2016-02-04 LAB — CULTURE, BLOOD (ROUTINE X 2)
Culture: NO GROWTH
Culture: NO GROWTH

## 2016-02-04 LAB — GLUCOSE, CAPILLARY
GLUCOSE-CAPILLARY: 116 mg/dL — AB (ref 65–99)
Glucose-Capillary: 139 mg/dL — ABNORMAL HIGH (ref 65–99)
Glucose-Capillary: 170 mg/dL — ABNORMAL HIGH (ref 65–99)

## 2016-02-04 NOTE — Progress Notes (Addendum)
PROGRESS NOTE    Lance [name]  STM:[id number] [name]: May 04, 1941 DOA: 01/30/2016 PCP: Pcp Not In System   Brief Narrative: Lance [name] is a 75 y.o. male with medical history significant for multiple sclerosis with spastic paraplegia, chronic kidney disease stage III, insulin-dependent diabetes mellitus, chronic Foley catheter with recurrent UTI, and initial pressure was around who presents to the emergency department at the direction of his wound care physician for evaluation of right ischial pressure ulcer with suspected secondary bacterial infection and concern for underlying osteomyelitis. X rays ruled out osteomyelitis. Surgery consulted for bedside debridement, underwent on 10/2, currently awaiting PT eval for SNF.    Assessment & Plan:   Principal Problem:   Cellulitis Active Problems:   Multiple sclerosis (HCC)   Sacral decubitus ulcer   Normocytic anemia   Spastic quadriplegia (HCC)   Chronic kidney disease, stage 3   Essential hypertension, benign   Urinary tract infectious disease   Decubitus ulcer of right ischial area   DM (diabetes mellitus) type II controlled with renal manifestation (HCC)   Recurrent UTI (urinary tract infection)   AKI (acute kidney injury) ([place])   Wound infection   Necrotic unstageable pressure ulcer of the right ischium: X rays do not reveal any osteomyelitis.  Wound care consulted and recommendations given.  SURGERY consulted, recommendations given. Underwent  bedside debridement ON 10/2 and PT for  hydrotherapy requested.  Air mattress ordered.  Currently on antibiotics.  Pt requests to see if he can be transferred to Dr Luetta Nutting at [facility]  after the bedside debridement. Discussed with Dr Luetta Nutting recommended outpatient follow up on discharge. Cultures from the decubitus ulcer show MRSA, on vancomycin. BID wet to dry dressing.  PT eval requested for SNF.    Mild acute on CKD stage 3; Gentle hydration and repeat renal parameters  in am remain stable. Marland Kitchen   UTI in pt with chronic foley catheter.  Cultures grew klebsiella and streptococcus.  Can d/c antibiotics after tomorrow dose.   Insulin dependent DM; CBG (last 3)   Recent Labs  02/03/16 2107 02/04/16 0751 02/04/16 1145  GLUCAP 146* 139* 170*    Resume SSI and levemir.  Added 3 units of premeal novolog coverage.   MS with spastic paraplegia; Stable.   Hypertension: Better controlled.    Anemia: Normocytic and probably from anemia of chronic disease.  - 1 unit of prbc transfusion.  - repeat H&H ordered is stable around 8.      DVT prophylaxis: heparin.  Code Status: full code.  Family Communication: none at bedside.  Disposition Plan: pending PT evaluation.    Consultants:   Wound care.   Surgery.    Procedures: bedside debridement.    Antimicrobials: vancomycin   Zosyn 9/29   Subjective: Pain is better. No new complaints.  Wants to go to rehab.  Objective: Vitals:   02/03/16 2109 02/04/16 0644 02/04/16 0800 02/04/16 1322  BP: (!) 156/66 (!) 155/66 (!) 155/68 (!) 158/66  Pulse: 71 81 80 81  Resp: 18 18  18   Temp: 98 F (36.7 C) 98.4 F (36.9 C) 98.4 F (36.9 C) 97.5 F (36.4 C)  TempSrc: Oral Oral Oral Oral  SpO2: 95% 94% 92% 92%  Weight:      Height:        Intake/Output Summary (Last 24 hours) at 02/04/16 1328 Last data filed at 02/04/16 1300  Gross per 24 hour  Intake  1030 ml  Output             1575 ml  Net             -545 ml   Filed Weights   01/30/16 1142 01/30/16 2131  Weight: 81.6 kg (180 lb) 82 kg (180 lb 12.4 oz)    Examination:  General exam: Appears calm and comfortable  Respiratory system: Clear to auscultation. Respiratory effort normal. Cardiovascular system: S1 & S2 heard, RRR. No JVD, murmurs, rubs, gallops or clicks. Gastrointestinal system: Abdomen is nondistended, soft and nontender. No organomegaly or masses felt. Normal bowel sounds heard. Central nervous system:  Alert and oriented. Extremities: contracted lower extremities.  Skin: unstageable pressure ulcer ot the right ischium, s/p debridement.  Psychiatry: Judgement and insight appear normal. Mood & affect appropriate.     Data Reviewed: I have personally reviewed following labs and imaging studies  CBC:  Recent Labs Lab 01/30/16 1305 01/31/16 0533 02/01/16 0506 02/02/16 1924  WBC 12.2* 8.3 7.4  --   NEUTROABS  --  6.2  --   --   HGB 8.8* 7.7* 7.4* 8.3*  HCT 27.3* 23.3* 22.2* 25.1*  MCV 92.9 92.1 89.2  --   PLT 335 297 297  --    Basic Metabolic Panel:  Recent Labs Lab 01/30/16 1305 01/31/16 0533 02/02/16 0508  NA 136 140 141  K 4.8 4.4 4.6  CL 106 112* 113*  CO2 21* 23 23  GLUCOSE 156* 143* 151*  BUN 42* 39* 38*  CREATININE 1.70* 1.69* 1.75*  CALCIUM 8.7* 8.1* 8.3*   GFR: Estimated Creatinine Clearance: 43 mL/min (by C-G formula based on SCr of 1.75 mg/dL (H)). Liver Function Tests:  Recent Labs Lab 01/30/16 1305  AST 36  ALT 35  ALKPHOS 85  BILITOT 0.7  PROT 7.8  ALBUMIN 2.8*    Recent Labs Lab 01/30/16 1305  LIPASE 18   No results for input(s): AMMONIA in the last 168 hours. Coagulation Profile:  Recent Labs Lab 01/31/16 0533  INR 1.10   Cardiac Enzymes: No results for input(s): CKTOTAL, CKMB, CKMBINDEX, TROPONINI in the last 168 hours. BNP (last 3 results) No results for input(s): PROBNP in the last 8760 hours. HbA1C: No results for input(s): HGBA1C in the last 72 hours. CBG:  Recent Labs Lab 02/03/16 1155 02/03/16 1726 02/03/16 2107 02/04/16 0751 02/04/16 1145  GLUCAP 222* 105* 146* 139* 170*   Lipid Profile: No results for input(s): CHOL, HDL, LDLCALC, TRIG, CHOLHDL, LDLDIRECT in the last 72 hours. Thyroid Function Tests: No results for input(s): TSH, T4TOTAL, FREET4, T3FREE, THYROIDAB in the last 72 hours. Anemia Panel: No results for input(s): VITAMINB12, FOLATE, FERRITIN, TIBC, IRON, RETICCTPCT in the last 72 hours. Sepsis  Labs:  Recent Labs Lab 01/30/16 1623  LATICACIDVEN 0.96    Recent Results (from the past 240 hour(s))  Urine culture     Status: Abnormal   Collection Time: 01/30/16 11:20 AM  Result Value Ref Range Status   Specimen Description URINE, CLEAN CATCH  Final   Special Requests NONE  Final   Culture (A)  Final    >=100,000 COLONIES/mL VIRIDANS STREPTOCOCCUS Standardized susceptibility testing for this organism is not available. >=100,000 COLONIES/mL KLEBSIELLA PNEUMONIAE    Report Status 02/01/2016 FINAL  Final   Organism ID, Bacteria KLEBSIELLA PNEUMONIAE (A)  Final      Susceptibility   Klebsiella pneumoniae - MIC*    AMPICILLIN >=32 RESISTANT Resistant     CEFAZOLIN <=4 SENSITIVE Sensitive  CEFTRIAXONE <=1 SENSITIVE Sensitive     CIPROFLOXACIN <=0.25 SENSITIVE Sensitive     GENTAMICIN <=1 SENSITIVE Sensitive     IMIPENEM <=0.25 SENSITIVE Sensitive     NITROFURANTOIN 64 INTERMEDIATE Intermediate     TRIMETH/SULFA >=320 RESISTANT Resistant     AMPICILLIN/SULBACTAM 4 SENSITIVE Sensitive     PIP/TAZO <=4 SENSITIVE Sensitive     Extended ESBL NEGATIVE Sensitive     * >=100,000 COLONIES/mL KLEBSIELLA PNEUMONIAE  Culture, blood (routine x 2)     Status: None (Preliminary result)   Collection Time: 01/30/16  4:11 PM  Result Value Ref Range Status   Specimen Description BLOOD LEFT ANTECUBITAL  Final   Special Requests BOTTLES DRAWN AEROBIC AND ANAEROBIC 5ML EA  Final   Culture   Final    NO GROWTH 4 DAYS Performed at [facility]    Report Status PENDING  Incomplete  Culture, blood (routine x 2)     Status: None (Preliminary result)   Collection Time: 01/30/16  4:24 PM  Result Value Ref Range Status   Specimen Description BLOOD RIGHT ANTECUBITAL  Final   Special Requests BOTTLES DRAWN AEROBIC AND ANAEROBIC 5CC  Final   Culture   Final    NO GROWTH 4 DAYS Performed at [facility]    Report Status PENDING  Incomplete  Wound or Superficial Culture      Status: None   Collection Time: 01/30/16  4:58 PM  Result Value Ref Range Status   Specimen Description WOUND  Final   Special Requests DECUBITUS  Final   Gram Stain   Final    ABUNDANT WBC PRESENT,BOTH PMN AND MONONUCLEAR ABUNDANT GRAM NEGATIVE RODS ABUNDANT GRAM POSITIVE COCCI IN PAIRS Performed at [facility]    Culture   Final    FEW METHICILLIN RESISTANT STAPHYLOCOCCUS AUREUS FEW ENTEROCOCCUS FAECALIS    Report Status 02/03/2016 FINAL  Final   Organism ID, Bacteria METHICILLIN RESISTANT STAPHYLOCOCCUS AUREUS  Final   Organism ID, Bacteria ENTEROCOCCUS FAECALIS  Final      Susceptibility   Enterococcus faecalis - MIC*    AMPICILLIN <=2 SENSITIVE Sensitive     VANCOMYCIN 1 SENSITIVE Sensitive     GENTAMICIN SYNERGY SENSITIVE Sensitive     * FEW ENTEROCOCCUS FAECALIS   Methicillin resistant staphylococcus aureus - MIC*    CIPROFLOXACIN >=8 RESISTANT Resistant     ERYTHROMYCIN >=8 RESISTANT Resistant     GENTAMICIN <=0.5 SENSITIVE Sensitive     OXACILLIN >=4 RESISTANT Resistant     TETRACYCLINE <=1 SENSITIVE Sensitive     VANCOMYCIN 1 SENSITIVE Sensitive     TRIMETH/SULFA >=320 RESISTANT Resistant     CLINDAMYCIN <=0.25 SENSITIVE Sensitive     RIFAMPIN <=0.5 SENSITIVE Sensitive     Inducible Clindamycin NEGATIVE Sensitive     * FEW METHICILLIN RESISTANT STAPHYLOCOCCUS AUREUS  MRSA PCR Screening     Status: None   Collection Time: 01/31/16  4:30 AM  Result Value Ref Range Status   MRSA by PCR NEGATIVE NEGATIVE Final    Comment:        The GeneXpert MRSA Assay (FDA approved for NASAL specimens only), is one component of a comprehensive MRSA colonization surveillance program. It is not intended to diagnose MRSA infection nor to guide or monitor treatment for MRSA infections.          Radiology Studies: No results found.      Scheduled Meds: . cholecalciferol  2,000 Units Oral Daily  . collagenase   Topical  Daily  . heparin  5,000 Units  Subcutaneous Q8H  . insulin aspart  0-5 Units Subcutaneous QHS  . insulin aspart  0-9 Units Subcutaneous TID WC  . insulin aspart  3 Units Subcutaneous TID WC  . insulin detemir  18 Units Subcutaneous QHS  . multivitamin with minerals  1 tablet Oral Daily  . oxybutynin  5 mg Oral BID  . piperacillin-tazobactam (ZOSYN)  IV  3.375 g Intravenous Q8H  . protein supplement shake  11 oz Oral BID BM  . simvastatin  20 mg Oral QPM  . vancomycin  1,000 mg Intravenous Q36H   Continuous Infusions:    LOS: 5 days    Time spent: 30 minutes.     Hosie Poisson, MD [facility] Pager [phone]   If 7PM-7AM, please contact night-coverage [email or web address] Password [email or web address] 02/04/2016, 1:28 PM

## 2016-02-04 NOTE — Progress Notes (Signed)
[facility] Progress Note     Subjective: NAE. Denies pain over pressure ulcer. Denies fever or chills. Tolerating PO.  States he plans to go to [facility] prior to his appointment with Dr. Luetta Nutting this Tuesday.  Objective: Vital signs in last 24 hours: Temp:  [97.8 F (36.6 C)-98.4 F (36.9 C)] 98.4 F (36.9 C) (10/03 0800) Pulse Rate:  [71-81] 80 (10/03 0800) Resp:  [18] 18 (10/03 0644) BP: (138-156)/(61-68) 155/68 (10/03 0800) SpO2:  [92 %-96 %] 92 % (10/03 0800) Last BM Date: 02/04/16  Intake/Output from previous day: 10/02 0701 - 10/03 0700 In: 1070 [P.O.:720; IV Piggyback:350] Out: 1350 [Urine:1350] Intake/Output this shift: Total I/O In: -  Out: 225 [Urine:225]  PE: Gen:  Alert, NAD, pleasant Pulm:  CTA, no W/R/R Abd: Soft, NT/ND, +BS Pressure ulcer: dressing c/d/i  Lab Results:   Recent Labs  02/02/16 1924  HGB 8.3*  HCT 25.1*   BMET  Recent Labs  02/02/16 0508  NA 141  K 4.6  CL 113*  CO2 23  GLUCOSE 151*  BUN 38*  CREATININE 1.75*  CALCIUM 8.3*   PT/INR No results for input(s): LABPROT, INR in the last 72 hours. CMP     Component Value Date/Time   NA 141 02/02/2016 0508   NA 140 08/12/2015   K 4.6 02/02/2016 0508   CL 113 (H) 02/02/2016 0508   CO2 23 02/02/2016 0508   GLUCOSE 151 (H) 02/02/2016 0508   BUN 38 (H) 02/02/2016 0508   BUN 31 (A) 08/12/2015   CREATININE 1.75 (H) 02/02/2016 0508   CALCIUM 8.3 (L) 02/02/2016 0508   PROT 7.8 01/30/2016 1305   ALBUMIN 2.8 (L) 01/30/2016 1305   AST 36 01/30/2016 1305   ALT 35 01/30/2016 1305   ALKPHOS 85 01/30/2016 1305   BILITOT 0.7 01/30/2016 1305   GFRNONAA 37 (L) 02/02/2016 0508   GFRAA 42 (L) 02/02/2016 0508   Lipase     Component Value Date/Time   LIPASE 18 01/30/2016 1305   Anti-infectives: Anti-infectives    Start     Dose/Rate Route Frequency Ordered Stop   02/03/16 0800  vancomycin (VANCOCIN) IVPB 1000 mg/200 mL premix     1,000 mg 200 mL/hr over 60  Minutes Intravenous Every 36 hours 02/03/16 0725     01/31/16 1600  vancomycin (VANCOCIN) 1,250 mg in sodium chloride 0.9 % 250 mL IVPB  Status:  Discontinued     1,250 mg 166.7 mL/hr over 90 Minutes Intravenous Every 24 hours 01/30/16 1639 02/02/16 1627   01/31/16 0000  piperacillin-tazobactam (ZOSYN) IVPB 3.375 g     3.375 g 12.5 mL/hr over 240 Minutes Intravenous Every 8 hours 01/30/16 1640     01/30/16 1645  vancomycin (VANCOCIN) 1,250 mg in sodium chloride 0.9 % 250 mL IVPB     1,250 mg 166.7 mL/hr over 90 Minutes Intravenous  Once 01/30/16 1632 01/30/16 1915   01/30/16 1645  piperacillin-tazobactam (ZOSYN) IVPB 3.375 g     3.375 g 100 mL/hr over 30 Minutes Intravenous  Once 01/30/16 1632 01/30/16 1738     Assessment/Plan Right ischial decubitus ulcer PMH decubitus ulcers with flap closure at [facility] [address] - BID wet-to-dry dressing changes - Debrided at bedside 9/30, 10/2 - Outpatient follow up with Dr. Luetta Nutting at [facility]  Multiple Sclerosis with spastic paraplegia CKD, Stage 3 IDDM Recurrent UTI's  Plan: wound clean after debridement yesterday. Continue current wound care. Mr. Aden should follow up with Plastic surgery MD after  discharge for further recommendations. General surgery will sign off, call with further questions or concerns.    LOS: 5 days    Jill Alexanders , [facility] 02/04/2016, 11:43 AM Pager: [phone] Consults: [phone] Mon-Fri 7:00 am-4:30 pm Sat-Sun 7:00 am-11:30 am

## 2016-02-04 NOTE — Care Management Note (Signed)
Case Management Note  Patient Details  Name: ETHIN DRUMMOND MRN: [id number] Date of Birth: 28-Jan-1941  Subjective/Objective: Noted Surgery-signed off. PT cons for therapy-await recc. PT-hydrotherapy.Active w/[facility] HH nursing-wound care-rep Manuela Schwartz already following.                   Action/Plan:d/c plan home w/HHC.   Expected Discharge Date:   (unknown)               Expected Discharge Plan:  [place]  In-House Referral:     Discharge planning Services  CM Consult  Post Acute Care Choice:  Home Health [facility] active w/HHRN) Choice offered to:     DME Arranged:    DME Agency:     HH Arranged:    HH Agency:     Status of Service:  In process, will continue to follow  If discussed at Long Length of Stay Meetings, dates discussed:    Additional Comments:  Dessa Phi, RN 02/04/2016, 11:57 AM

## 2016-02-05 DIAGNOSIS — N319 Neuromuscular dysfunction of bladder, unspecified: Secondary | ICD-10-CM | POA: Diagnosis present

## 2016-02-05 DIAGNOSIS — I5021 Acute systolic (congestive) heart failure: Secondary | ICD-10-CM | POA: Diagnosis not present

## 2016-02-05 DIAGNOSIS — L89204 Pressure ulcer of unspecified hip, stage 4: Secondary | ICD-10-CM | POA: Diagnosis not present

## 2016-02-05 DIAGNOSIS — L89154 Pressure ulcer of sacral region, stage 4: Secondary | ICD-10-CM | POA: Diagnosis present

## 2016-02-05 DIAGNOSIS — D631 Anemia in chronic kidney disease: Secondary | ICD-10-CM | POA: Diagnosis present

## 2016-02-05 DIAGNOSIS — J811 Chronic pulmonary edema: Secondary | ICD-10-CM | POA: Diagnosis not present

## 2016-02-05 DIAGNOSIS — D649 Anemia, unspecified: Secondary | ICD-10-CM | POA: Diagnosis not present

## 2016-02-05 DIAGNOSIS — J9601 Acute respiratory failure with hypoxia: Secondary | ICD-10-CM | POA: Diagnosis present

## 2016-02-05 DIAGNOSIS — N390 Urinary tract infection, site not specified: Secondary | ICD-10-CM | POA: Diagnosis not present

## 2016-02-05 DIAGNOSIS — I5023 Acute on chronic systolic (congestive) heart failure: Secondary | ICD-10-CM | POA: Diagnosis present

## 2016-02-05 DIAGNOSIS — Z6820 Body mass index (BMI) 20.0-20.9, adult: Secondary | ICD-10-CM | POA: Diagnosis not present

## 2016-02-05 DIAGNOSIS — I420 Dilated cardiomyopathy: Secondary | ICD-10-CM | POA: Diagnosis not present

## 2016-02-05 DIAGNOSIS — G8220 Paraplegia, unspecified: Secondary | ICD-10-CM | POA: Diagnosis not present

## 2016-02-05 DIAGNOSIS — R748 Abnormal levels of other serum enzymes: Secondary | ICD-10-CM | POA: Diagnosis not present

## 2016-02-05 DIAGNOSIS — N179 Acute kidney failure, unspecified: Secondary | ICD-10-CM | POA: Diagnosis not present

## 2016-02-05 DIAGNOSIS — N059 Unspecified nephritic syndrome with unspecified morphologic changes: Secondary | ICD-10-CM | POA: Diagnosis present

## 2016-02-05 DIAGNOSIS — E43 Unspecified severe protein-calorie malnutrition: Secondary | ICD-10-CM | POA: Diagnosis not present

## 2016-02-05 DIAGNOSIS — I428 Other cardiomyopathies: Secondary | ICD-10-CM | POA: Diagnosis not present

## 2016-02-05 DIAGNOSIS — B9562 Methicillin resistant Staphylococcus aureus infection as the cause of diseases classified elsewhere: Secondary | ICD-10-CM | POA: Diagnosis not present

## 2016-02-05 DIAGNOSIS — E1169 Type 2 diabetes mellitus with other specified complication: Secondary | ICD-10-CM | POA: Diagnosis present

## 2016-02-05 DIAGNOSIS — G35 Multiple sclerosis: Secondary | ICD-10-CM | POA: Diagnosis not present

## 2016-02-05 DIAGNOSIS — I5041 Acute combined systolic (congestive) and diastolic (congestive) heart failure: Secondary | ICD-10-CM | POA: Diagnosis not present

## 2016-02-05 DIAGNOSIS — M6281 Muscle weakness (generalized): Secondary | ICD-10-CM | POA: Diagnosis not present

## 2016-02-05 DIAGNOSIS — L89314 Pressure ulcer of right buttock, stage 4: Secondary | ICD-10-CM | POA: Diagnosis present

## 2016-02-05 DIAGNOSIS — Z87440 Personal history of urinary (tract) infections: Secondary | ICD-10-CM | POA: Diagnosis not present

## 2016-02-05 DIAGNOSIS — Z466 Encounter for fitting and adjustment of urinary device: Secondary | ICD-10-CM | POA: Diagnosis not present

## 2016-02-05 DIAGNOSIS — L89310 Pressure ulcer of right buttock, unstageable: Secondary | ICD-10-CM | POA: Diagnosis not present

## 2016-02-05 DIAGNOSIS — G8250 Quadriplegia, unspecified: Secondary | ICD-10-CM | POA: Diagnosis not present

## 2016-02-05 DIAGNOSIS — M86151 Other acute osteomyelitis, right femur: Secondary | ICD-10-CM | POA: Diagnosis not present

## 2016-02-05 DIAGNOSIS — N185 Chronic kidney disease, stage 5: Secondary | ICD-10-CM | POA: Diagnosis not present

## 2016-02-05 DIAGNOSIS — M8610 Other acute osteomyelitis, unspecified site: Secondary | ICD-10-CM | POA: Diagnosis present

## 2016-02-05 DIAGNOSIS — R069 Unspecified abnormalities of breathing: Secondary | ICD-10-CM | POA: Diagnosis not present

## 2016-02-05 DIAGNOSIS — Z993 Dependence on wheelchair: Secondary | ICD-10-CM | POA: Diagnosis not present

## 2016-02-05 DIAGNOSIS — R531 Weakness: Secondary | ICD-10-CM | POA: Diagnosis not present

## 2016-02-05 DIAGNOSIS — B967 Clostridium perfringens [C. perfringens] as the cause of diseases classified elsewhere: Secondary | ICD-10-CM | POA: Diagnosis not present

## 2016-02-05 DIAGNOSIS — M792 Neuralgia and neuritis, unspecified: Secondary | ICD-10-CM | POA: Diagnosis not present

## 2016-02-05 DIAGNOSIS — R0902 Hypoxemia: Secondary | ICD-10-CM | POA: Diagnosis not present

## 2016-02-05 DIAGNOSIS — Z794 Long term (current) use of insulin: Secondary | ICD-10-CM | POA: Diagnosis not present

## 2016-02-05 DIAGNOSIS — R0602 Shortness of breath: Secondary | ICD-10-CM | POA: Diagnosis not present

## 2016-02-05 DIAGNOSIS — N184 Chronic kidney disease, stage 4 (severe): Secondary | ICD-10-CM | POA: Diagnosis present

## 2016-02-05 DIAGNOSIS — L8992 Pressure ulcer of unspecified site, stage 2: Secondary | ICD-10-CM | POA: Diagnosis not present

## 2016-02-05 DIAGNOSIS — I248 Other forms of acute ischemic heart disease: Secondary | ICD-10-CM | POA: Diagnosis not present

## 2016-02-05 DIAGNOSIS — I151 Hypertension secondary to other renal disorders: Secondary | ICD-10-CM | POA: Diagnosis not present

## 2016-02-05 DIAGNOSIS — I10 Essential (primary) hypertension: Secondary | ICD-10-CM | POA: Diagnosis not present

## 2016-02-05 DIAGNOSIS — F329 Major depressive disorder, single episode, unspecified: Secondary | ICD-10-CM | POA: Diagnosis not present

## 2016-02-05 DIAGNOSIS — I509 Heart failure, unspecified: Secondary | ICD-10-CM | POA: Diagnosis not present

## 2016-02-05 DIAGNOSIS — E1122 Type 2 diabetes mellitus with diabetic chronic kidney disease: Secondary | ICD-10-CM | POA: Diagnosis not present

## 2016-02-05 DIAGNOSIS — N170 Acute kidney failure with tubular necrosis: Secondary | ICD-10-CM | POA: Diagnosis not present

## 2016-02-05 DIAGNOSIS — N183 Chronic kidney disease, stage 3 (moderate): Secondary | ICD-10-CM | POA: Diagnosis not present

## 2016-02-05 DIAGNOSIS — B999 Unspecified infectious disease: Secondary | ICD-10-CM | POA: Diagnosis not present

## 2016-02-05 DIAGNOSIS — I132 Hypertensive heart and chronic kidney disease with heart failure and with stage 5 chronic kidney disease, or end stage renal disease: Secondary | ICD-10-CM | POA: Diagnosis present

## 2016-02-05 DIAGNOSIS — F411 Generalized anxiety disorder: Secondary | ICD-10-CM | POA: Diagnosis present

## 2016-02-05 DIAGNOSIS — I129 Hypertensive chronic kidney disease with stage 1 through stage 4 chronic kidney disease, or unspecified chronic kidney disease: Secondary | ICD-10-CM | POA: Diagnosis not present

## 2016-02-05 DIAGNOSIS — J9600 Acute respiratory failure, unspecified whether with hypoxia or hypercapnia: Secondary | ICD-10-CM | POA: Diagnosis not present

## 2016-02-05 DIAGNOSIS — J810 Acute pulmonary edema: Secondary | ICD-10-CM | POA: Diagnosis not present

## 2016-02-05 DIAGNOSIS — L89150 Pressure ulcer of sacral region, unstageable: Secondary | ICD-10-CM | POA: Diagnosis not present

## 2016-02-05 LAB — CBC
HEMATOCRIT: 25.6 % — AB (ref 39.0–52.0)
HEMOGLOBIN: 8.5 g/dL — AB (ref 13.0–17.0)
MCH: 30.1 pg (ref 26.0–34.0)
MCHC: 33.2 g/dL (ref 30.0–36.0)
MCV: 90.8 fL (ref 78.0–100.0)
PLATELETS: 319 10*3/uL (ref 150–400)
RBC: 2.82 MIL/uL — AB (ref 4.22–5.81)
RDW: 15.4 % (ref 11.5–15.5)
WBC: 8 10*3/uL (ref 4.0–10.5)

## 2016-02-05 LAB — GLUCOSE, CAPILLARY
GLUCOSE-CAPILLARY: 135 mg/dL — AB (ref 65–99)
Glucose-Capillary: 137 mg/dL — ABNORMAL HIGH (ref 65–99)
Glucose-Capillary: 110 mg/dL — ABNORMAL HIGH (ref 65–99)
Glucose-Capillary: 114 mg/dL — ABNORMAL HIGH (ref 65–99)

## 2016-02-05 LAB — BASIC METABOLIC PANEL
ANION GAP: 7 (ref 5–15)
BUN: 44 mg/dL — ABNORMAL HIGH (ref 6–20)
CHLORIDE: 109 mmol/L (ref 101–111)
CO2: 22 mmol/L (ref 22–32)
CREATININE: 1.82 mg/dL — AB (ref 0.61–1.24)
Calcium: 8.4 mg/dL — ABNORMAL LOW (ref 8.9–10.3)
GFR calc non Af Amer: 35 mL/min — ABNORMAL LOW (ref >60)
GFR, EST AFRICAN AMERICAN: 40 mL/min — AB (ref >60)
Glucose, Bld: 89 mg/dL (ref 65–99)
POTASSIUM: 4.2 mmol/L (ref 3.5–5.1)
SODIUM: 138 mmol/L (ref 135–145)

## 2016-02-05 MED ORDER — SULFAMETHOXAZOLE-TRIMETHOPRIM 800-160 MG PO TABS: 1.0000 | ORAL_TABLET | Freq: Two times a day (BID) | ORAL | 0 refills | Status: AC

## 2016-02-05 MED ORDER — PREMIER PROTEIN SHAKE: 11.0000 [oz_av] | Freq: Two times a day (BID) | ORAL | 0 refills | Status: DC

## 2016-02-05 MED ORDER — COLLAGENASE 250 UNIT/GM EX OINT: TOPICAL_OINTMENT | Freq: Every day | CUTANEOUS | 0 refills | Status: DC

## 2016-02-05 MED ORDER — HYDROCODONE-ACETAMINOPHEN 5-325 MG PO TABS: 1.0000 | ORAL_TABLET | ORAL | 0 refills | Status: DC | PRN

## 2016-02-05 NOTE — Progress Notes (Signed)
Nutrition Follow-up  DOCUMENTATION CODES:   Not applicable  INTERVENTION:  - Continue Premier Protein BID. - Continue to encourage PO intakes of meals and supplements. - RD will continue to monitor for additional nutrition-related needs.  NUTRITION DIAGNOSIS:   Increased nutrient needs related to wound healing as evidenced by estimated needs. -ongoing  GOAL:   Patient will meet greater than or equal to 90% of their needs -met on average.  MONITOR:   PO intake, Supplement acceptance, Labs, Weight trends, I & O's, Skin  ASSESSMENT:   75 y.o. male with medical history significant for multiple sclerosis with spastic paraplegia, chronic kidney disease stage III, insulin-dependent diabetes mellitus, chronic Foley catheter with recurrent UTI, and initial pressure was around who presents to the emergency department at the direction of his wound care physician for evaluation of right ischial pressure ulcer with suspected secondary bacterial infection and concern for underlying osteomyelitis. Patient is wheelchair bound secondary to his MS and associated paraplegia, and has history of left ischial pressure wound which became infected with underlying osteomyelitis and is status post surgical debridement and skin flap. Patient has more recently developed a wound over the right ischium and has been following with wound care.  10/4 Per chart review, pt consumed 100% of breakfast and 85% of lunch on 10/2; 100% of breakfast, 50% of lunch, and 75% of dinner on 10/3; 75% of breakfast this AM. Pt reports that his appetite has been improving and that he has been focusing on protein with each meal. Reports no issues or difficulties with meals other than last night when he began gagging/dry heaving after eating a piece of chicken. Pt states he ate the rest of the items on the tray and had no other issues.   He was drinking Premier Protein shakes PTA and does not have a preference of them being cold or room  temperature. Pt reports that he is able to drink 1.5 cartons/day without issue but sometimes has difficulty completing half of the second shake. Continued to encourage pt to do the best he is able.   Pt reports possible d/c to [facility] today. He denies any nutrition-related questions or needs at this time.  Medications reviewed; 2000 units vitamin D/day, sliding scale Novolog, 3 units Novolog TID with meals, 18 units Levemir/day, daily multivitamin with minerals, PRN Zofran.  Labs reviewed; CBG: 110 mg/dL this AM, BUN: 44 mg/dL, creatinine: 1.82 mg/dL, Ca: 8.4 mg/dL, GFR: 35 mL/min.    9/29 - Patient's wife and son present upon assessment.  - Patient reports he has had weight loss over the past year or so due to his wounds and surgeries (debridement and skin flaps).  - UBW 200 lbs. Per chart he lost 16.1 kg (17% body weight) over 1 year and has since been regaining weight. Weight loss was not significant for time frame.  - His appetite was poor for a while but he reports it had been getting better and he's been gaining weight recently.  - Patient and wife are very well-educated in nutrition related to both needs for wounds and also in management of DM.  - Patient usually eats 3 meals/day + 1 snack in the evening.  - Has 1-2 Premier protein drinks daily and takes Vitamin D, Vitamin C, and a MVI with "adequate zinc content."  - Meal Completion: 100% breakfast and lunch today per chart and patient report. - Nutrition-Focused physical exam completed. Findings are moderate fat depletion, severe muscle depletion, and no edema. Depletion on legs are  the most severe in setting of paraplegia.    Diet Order:  Diet regular Room service appropriate? Yes; Fluid consistency: Thin  Skin:  Wound (see comment) (Unstageable to R ischial, closed incision L hip)  Last BM:  10/3  Height:   Ht Readings from Last 1 Encounters:  01/30/16 '6\' 4"'  (1.93 m)    Weight:   Wt Readings from Last 1 Encounters:   01/30/16 180 lb 12.4 oz (82 kg)    Ideal Body Weight:  91.82 kg  BMI:  Body mass index is 22 kg/m.  Estimated Nutritional Needs:   Kcal:  2288 (27.9 kcal/kg per EAL for paraplegia)  Protein:  82-123 grams (1-1.5 g/kg per EAL for paraplegia with chronic wound)  Fluid:  2.2 L/day  EDUCATION NEEDS:   Education needs addressed (Increased protein and calorie needs for wound healing.)    Jarome Matin, MS, RD, LDN Inpatient Clinical Dietitian Pager # [phone] After hours/weekend pager # [phone]

## 2016-02-05 NOTE — Clinical Social Work Placement (Signed)
Patient has a bed at [facility]. CSW has completed FL2 & will continue to follow and assist with discharge when ready.    Raynaldo Opitz, [facility] Clinical Social Worker cell #: [phone]     CLINICAL SOCIAL WORK PLACEMENT  NOTE  Date:  02/05/2016  Patient Details  Name: Lance [name] MRN: [id number] Date of Birth: April 01, 1941  Clinical Social Work is seeking post-discharge placement for this patient at the [place] level of care (*CSW will initial, date and re-position this form in  chart as items are completed):  Yes   Patient/family provided with [facility] Work Department's list of facilities offering this level of care within the geographic area requested by the patient (or if unable, by the patient's family).  Yes   Patient/family informed of their freedom to choose among providers that offer the needed level of care, that participate in Medicare, Medicaid or managed care program needed by the patient, have an available bed and are willing to accept the patient.  Yes   Patient/family informed of [facility]'s ownership interest in [facility] and [facility], as well as of the fact that they are under no obligation to receive care at these facilities.  PASRR submitted to EDS on       PASRR number received on       Existing PASRR number confirmed on 02/05/16     FL2 transmitted to all facilities in geographic area requested by pt/family on 02/05/16     FL2 transmitted to all facilities within larger geographic area on       Patient informed that his/her managed care company has contracts with or will negotiate with certain facilities, including the following:        Yes   Patient/family informed of bed offers received.  Patient chooses bed at [place] recommends and patient chooses bed at      Patient to be transferred to [facility] on   .  Patient to be transferred to facility by       Patient family notified on   of transfer.  Name of family member notified:        PHYSICIAN       Additional Comment:    _______________________________________________ Standley Brooking, LCSW 02/05/2016, 11:23 AM

## 2016-02-05 NOTE — Clinical Social Work Placement (Signed)
Patient is set to discharge to [facility] today. Patient & wife made aware. Discharge packet given to RN, Archer Asa. [facility] called for transport.     Raynaldo Opitz, [facility] Clinical Social Worker cell #: [phone]    CLINICAL SOCIAL WORK PLACEMENT  NOTE  Date:  02/05/2016  Patient Details  Name: Lance [name] MRN: [id number] Date of Birth: Mar 14, 1941  Clinical Social Work is seeking post-discharge placement for this patient at the [place] level of care (*CSW will initial, date and re-position this form in  chart as items are completed):  Yes   Patient/family provided with [place] Work Department's list of facilities offering this level of care within the geographic area requested by the patient (or if unable, by the patient's family).  Yes   Patient/family informed of their freedom to choose among providers that offer the needed level of care, that participate in Medicare, Medicaid or managed care program needed by the patient, have an available bed and are willing to accept the patient.  Yes   Patient/family informed of [facility]'s ownership interest in [facility] and [facility], as well as of the fact that they are under no obligation to receive care at these facilities.  PASRR submitted to EDS on       PASRR number received on       Existing PASRR number confirmed on 02/05/16     FL2 transmitted to all facilities in geographic area requested by pt/family on 02/05/16     FL2 transmitted to all facilities within larger geographic area on       Patient informed that his/her managed care company has contracts with or will negotiate with certain facilities, including the following:        Yes   Patient/family informed of bed offers received.  Patient chooses bed at [place] recommends and patient chooses bed at      Patient to be transferred to [facility] on 02/05/16.  Patient to be transferred to facility by [facility]     Patient family notified on 02/05/16 of transfer.  Name of family member notified:  patient's wife     PHYSICIAN       Additional Comment:    _______________________________________________ Standley Brooking, LCSW 02/05/2016, 1:42 PM

## 2016-02-05 NOTE — Care Management Important Message (Signed)
Important Message  Patient Details Important Message  Patient Details IM Letter given to Cookie/Case Manager to present to Patient Name: Lance [name] MRN: [id number] Date of Birth: 22-Jul-1940   Medicare Important Message Given:  Yes    Camillo Flaming 02/05/2016, 10:30 AM Name: Lance [name] MRN: [id number] Date of Birth: May 31, 1940   Medicare Important Message Given:  Yes    Camillo Flaming 02/05/2016, 10:21 AMImportant Message  Patient Details  Name: Lance [name] MRN: [id number] Date of Birth: 1941/02/05   Medicare Important Message Given:  Yes    Camillo Flaming 02/05/2016, 10:21 AM

## 2016-02-05 NOTE — Discharge Summary (Signed)
Discharge Summary  Lance [name] KXF:[id number] [name]: 1941-01-10  PCP: Pcp Not In System  Admit date: 01/30/2016 Discharge date: 02/05/2016   Recommendations for Outpatient Follow-up:  1. PCP 2 weeks 2. Dr. Luetta Nutting at [facility] as scheduled   Discharge Diagnoses:  Active Hospital Problems   Diagnosis Date Noted  . Cellulitis 01/30/2016  . AKI (acute kidney injury) ([place]) 01/30/2016  . Wound infection 01/30/2016  . DM (diabetes mellitus) type II controlled with renal manifestation ([place]) 09/06/2015  . Recurrent UTI (urinary tract infection) 09/06/2015  . Decubitus ulcer of right ischial area 06/14/2015  . Urinary tract infectious disease   . Chronic kidney disease, stage 3 08/03/2013  . Essential hypertension, benign 08/03/2013  . Spastic quadriplegia ([place]) 07/10/2011  . Multiple sclerosis ([place]) 03/30/2011  . Sacral decubitus ulcer 03/30/2011  . Normocytic anemia 03/30/2011    Resolved Hospital Problems   Diagnosis Date Noted Date Resolved  . Decubital ulcer 01/30/2016 01/30/2016    Discharge Condition: Stable   Diet recommendation: Regular   Vitals:   02/04/16 2203 02/05/16 0550  BP: (!) 161/63 (!) 156/67  Pulse: 80 80  Resp: 16 16  Temp: 98 F (36.7 C) 97.9 F (36.6 C)    HPI and Brief Hospital Course:  Lance Wilhelmsen Rockeleinis a 75 y.o.malewith medical history significant for multiple sclerosis with spastic paraplegia, chronic kidney disease stage III, insulin-dependent diabetes mellitus, chronic Foley catheter with recurrent UTI, and initial pressure was around who presented to the emergency department at the direction of his wound care physician for evaluation of right ischial pressure ulcer with suspected secondary bacterial infection and concern for underlying osteomyelitis. X rays ruled out osteomyelitis. Surgery consulted for bedside debridement which occurred on 10/2. No evidence of active deeper infection after debridement. Patient was on empiric  vancomycin and zosyn for total 6 days during his hospital stay for UTI and wound infection. Will discharge to SNF today on 4 more days of PO Bactrim.  Discharge details, plan of care and follow up instructions were discussed with patient and any available family or care providers. Patient and family are in agreement with discharge from the hospital today and all questions were answered to their satisfaction.  Details of Hospital Course:  Necrotic unstageable pressure ulcer of the right ischium: X rays do not reveal any osteomyelitis.  Wound care consulted and recommendations given.  SURGERY consulted, recommendations given. Underwent  bedside debridement ON 10/2 and PT for  hydrotherapy requested.  Air mattress ordered.  Currently on antibiotics, IV vanc/zosyn in house and will be on PO bactrim at d/c. Pt requests to see if he can be transferred to Dr Luetta Nutting at [facility]  after the bedside debridement. Discussed with Dr Luetta Nutting recommended outpatient follow up on discharge. Cultures from the decubitus ulcer show MRSA, on vancomycin. BID wet to dry dressing.  Ready for SNF today, will f/u with Dr. Luetta Nutting as outpatient as previously planned.  Mild acute on CKD stage 3; Gentle hydration and repeat renal parameters today remain stable.   UTI in pt with chronic foley catheter.  Cultures grew klebsiella and streptococcus.  Received complete course of abx in house.  Insulin dependent DM; CBG (last 3)   Recent Labs (last 2 labs)    Recent Labs  02/03/16 2107 02/04/16 0751 02/04/16 1145  GLUCAP 146* 139* 170*     Resume SSI and levemir.   MS with spastic paraplegia; Stable.   Hypertension: Better controlled.   Anemia: Normocytic and probably from  anemia of chronic disease.  - 1 unit of prbc transfusion during this hospital stay.  - repeat H&H ordered is stable around 8.     Procedures:  Bedside debridment 10/2   Consultations:  Surgery   Discharge Exam: BP (!)  156/67 (BP Location: Right Arm)   Pulse 80   Temp 97.9 F (36.6 C) (Oral)   Resp 16   Ht 6\' 4"  (1.93 m)   Wt 82 kg (180 lb 12.4 oz)   SpO2 95%   BMI 22.00 kg/m  General:  Alert, oriented, calm, in no acute distress, thin, with spastic paralysis of legs Eyes: EOMI, clear sclerea Neck: supple, no masses, trachea mildline  Cardiovascular: RRR, no murmurs or rubs, no peripheral edema  Respiratory: clear to auscultation bilaterally, no wheezes, no crackles  Abdomen: soft, nontender, nondistended, normal bowel tones heard  Skin: dry, no rashes  Musculoskeletal: no joint effusions, normal range of motion  Psychiatric: appropriate affect, normal speech  Neurologic: extraocular muscles intact, clear speech, moving all extremities with intact sensorium    Discharge Instructions You were cared for by a hospitalist during your hospital stay. If you have any questions about your discharge medications or the care you received while you were in the hospital after you are discharged, you can call the unit and asked to speak with the hospitalist on call if the hospitalist that took care of you is not available. Once you are discharged, your primary care physician will handle any further medical issues. Please note that NO REFILLS for any discharge medications will be authorized once you are discharged, as it is imperative that you return to your primary care physician (or establish a relationship with a primary care physician if you do not have one) for your aftercare needs so that they can reassess your need for medications and monitor your lab values.  Discharge Instructions    Diet - low sodium heart healthy    Complete by:  As directed    Increase activity slowly    Complete by:  As directed        Medication List    STOP taking these medications   trimethoprim 100 MG tablet Commonly known as:  TRIMPEX     TAKE these medications   aspirin 81 MG tablet Take 1 tablet (81 mg total) by mouth  daily.   bisacodyl 10 MG suppository Commonly known as:  DULCOLAX Place 1 suppository (10 mg total) rectally daily as needed for moderate constipation.   collagenase ointment Commonly known as:  SANTYL Apply topically daily. Start taking on:  02/06/2016   fish oil-omega-3 fatty acids 1000 MG capsule Take 1 capsule (1 g total) by mouth daily.   HYDROcodone-acetaminophen 5-325 MG tablet Commonly known as:  NORCO/VICODIN Take 1-2 tablets by mouth every 4 (four) hours as needed for moderate pain.   insulin aspart 100 UNIT/ML FlexPen Commonly known as:  NOVOLOG FLEXPEN Inject 8 Units into the skin 3 (three) times daily with meals.   LEVEMIR FLEXTOUCH 100 UNIT/ML Pen Generic drug:  Insulin Detemir Inject 18 Units into the skin daily.   multivitamin with minerals Tabs tablet Take 1 tablet by mouth daily.   oxybutynin 5 MG tablet Commonly known as:  DITROPAN Take 5 mg by mouth 2 (two) times daily.   protein supplement shake Liqd Commonly known as:  PREMIER PROTEIN Take 325 mLs (11 oz total) by mouth 2 (two) times daily between meals.   simvastatin 20 MG tablet Commonly known as:  ZOCOR Take 20 mg by mouth every evening.   VITAMIN C PO Take 1 tablet by mouth daily.   Vitamin D3 2000 units Tabs Take 2,000 mg by mouth daily.      No Known Allergies    The results of significant diagnostics from this hospitalization (including imaging, microbiology, ancillary and laboratory) are listed below for reference.    Significant Diagnostic Studies: Dg Hip Unilat With Pelvis 2-3 Views Right  Result Date: 01/30/2016 CLINICAL DATA:  75 year old with approximate 6 week history of a decubitus ulcer involving the right buttock, with recent worsening of the infection. EXAM: DG HIP (WITH OR WITHOUT PELVIS) 2-3V RIGHT COMPARISON:  Right hip x-rays 05/28/2015.  MRI pelvis 09/01/2014. FINDINGS: No evidence of acute fracture or dislocation. No evidence of osteomyelitis. No visible joint  effusion. Moderate joint space narrowing. Bone mineral density well-preserved. Included AP pelvis again shows the healed osteomyelitis with new bone formation at the left ischium. Contralateral left hip joint with symmetric moderate joint space narrowing. Sacroiliac joints and symphysis pubis intact. Mild degenerative changes involving the visualized lower lumbar spine. Penile prosthesis again noted. IMPRESSION: No acute osseous abnormality. No radiographic evidence of acute osteomyelitis. Electronically Signed   By: Evangeline Dakin M.D.   On: 01/30/2016 20:36    Microbiology: Recent Results (from the past 240 hour(s))  Urine culture     Status: Abnormal   Collection Time: 01/30/16 11:20 AM  Result Value Ref Range Status   Specimen Description URINE, CLEAN CATCH  Final   Special Requests NONE  Final   Culture (A)  Final    >=100,000 COLONIES/mL VIRIDANS STREPTOCOCCUS Standardized susceptibility testing for this organism is not available. >=100,000 COLONIES/mL KLEBSIELLA PNEUMONIAE    Report Status 02/01/2016 FINAL  Final   Organism ID, Bacteria KLEBSIELLA PNEUMONIAE (A)  Final      Susceptibility   Klebsiella pneumoniae - MIC*    AMPICILLIN >=32 RESISTANT Resistant     CEFAZOLIN <=4 SENSITIVE Sensitive     CEFTRIAXONE <=1 SENSITIVE Sensitive     CIPROFLOXACIN <=0.25 SENSITIVE Sensitive     GENTAMICIN <=1 SENSITIVE Sensitive     IMIPENEM <=0.25 SENSITIVE Sensitive     NITROFURANTOIN 64 INTERMEDIATE Intermediate     TRIMETH/SULFA >=320 RESISTANT Resistant     AMPICILLIN/SULBACTAM 4 SENSITIVE Sensitive     PIP/TAZO <=4 SENSITIVE Sensitive     Extended ESBL NEGATIVE Sensitive     * >=100,000 COLONIES/mL KLEBSIELLA PNEUMONIAE  Culture, blood (routine x 2)     Status: None   Collection Time: 01/30/16  4:11 PM  Result Value Ref Range Status   Specimen Description BLOOD LEFT ANTECUBITAL  Final   Special Requests BOTTLES DRAWN AEROBIC AND ANAEROBIC 5ML EA  Final   Culture   Final    NO  GROWTH 5 DAYS Performed at [facility]    Report Status 02/04/2016 FINAL  Final  Culture, blood (routine x 2)     Status: None   Collection Time: 01/30/16  4:24 PM  Result Value Ref Range Status   Specimen Description BLOOD RIGHT ANTECUBITAL  Final   Special Requests BOTTLES DRAWN AEROBIC AND ANAEROBIC 5CC  Final   Culture   Final    NO GROWTH 5 DAYS Performed at [facility]    Report Status 02/04/2016 FINAL  Final  Wound or Superficial Culture     Status: None   Collection Time: 01/30/16  4:58 PM  Result Value Ref Range Status   Specimen Description WOUND  Final   Special Requests DECUBITUS  Final   Gram Stain   Final    ABUNDANT WBC PRESENT,BOTH PMN AND MONONUCLEAR ABUNDANT GRAM NEGATIVE RODS ABUNDANT GRAM POSITIVE COCCI IN PAIRS Performed at [facility]    Culture   Final    FEW METHICILLIN RESISTANT STAPHYLOCOCCUS AUREUS FEW ENTEROCOCCUS FAECALIS    Report Status 02/03/2016 FINAL  Final   Organism ID, Bacteria METHICILLIN RESISTANT STAPHYLOCOCCUS AUREUS  Final   Organism ID, Bacteria ENTEROCOCCUS FAECALIS  Final      Susceptibility   Enterococcus faecalis - MIC*    AMPICILLIN <=2 SENSITIVE Sensitive     VANCOMYCIN 1 SENSITIVE Sensitive     GENTAMICIN SYNERGY SENSITIVE Sensitive     * FEW ENTEROCOCCUS FAECALIS   Methicillin resistant staphylococcus aureus - MIC*    CIPROFLOXACIN >=8 RESISTANT Resistant     ERYTHROMYCIN >=8 RESISTANT Resistant     GENTAMICIN <=0.5 SENSITIVE Sensitive     OXACILLIN >=4 RESISTANT Resistant     TETRACYCLINE <=1 SENSITIVE Sensitive     VANCOMYCIN 1 SENSITIVE Sensitive     TRIMETH/SULFA >=320 RESISTANT Resistant     CLINDAMYCIN <=0.25 SENSITIVE Sensitive     RIFAMPIN <=0.5 SENSITIVE Sensitive     Inducible Clindamycin NEGATIVE Sensitive     * FEW METHICILLIN RESISTANT STAPHYLOCOCCUS AUREUS  MRSA PCR Screening     Status: None   Collection Time: 01/31/16  4:30 AM  Result Value Ref Range Status   MRSA by PCR  NEGATIVE NEGATIVE Final    Comment:        The GeneXpert MRSA Assay (FDA approved for NASAL specimens only), is one component of a comprehensive MRSA colonization surveillance program. It is not intended to diagnose MRSA infection nor to guide or monitor treatment for MRSA infections.      Labs: Basic Metabolic Panel:  Recent Labs Lab 01/30/16 1305 01/31/16 0533 02/02/16 0508 02/05/16 0836  NA 136 140 141 138  K 4.8 4.4 4.6 4.2  CL 106 112* 113* 109  CO2 21* 23 23 22   GLUCOSE 156* 143* 151* 89  BUN 42* 39* 38* 44*  CREATININE 1.70* 1.69* 1.75* 1.82*  CALCIUM 8.7* 8.1* 8.3* 8.4*   Liver Function Tests:  Recent Labs Lab 01/30/16 1305  AST 36  ALT 35  ALKPHOS 85  BILITOT 0.7  PROT 7.8  ALBUMIN 2.8*    Recent Labs Lab 01/30/16 1305  LIPASE 18   No results for input(s): AMMONIA in the last 168 hours. CBC:  Recent Labs Lab 01/30/16 1305 01/31/16 0533 02/01/16 0506 02/02/16 1924 02/05/16 0836  WBC 12.2* 8.3 7.4  --  8.0  NEUTROABS  --  6.2  --   --   --   HGB 8.8* 7.7* 7.4* 8.3* 8.5*  HCT 27.3* 23.3* 22.2* 25.1* 25.6*  MCV 92.9 92.1 89.2  --  90.8  PLT 335 297 297  --  319   Cardiac Enzymes: No results for input(s): CKTOTAL, CKMB, CKMBINDEX, TROPONINI in the last 168 hours. BNP: BNP (last 3 results) No results for input(s): BNP in the last 8760 hours.  ProBNP (last 3 results) No results for input(s): PROBNP in the last 8760 hours.  CBG:  Recent Labs Lab 02/04/16 1145 02/04/16 1706 02/04/16 2200 02/05/16 0733 02/05/16 1155  GLUCAP 170* 116* 137* 110* 135*    Time spent: 41 minutes were spent in preparing this discharge including medication reconciliation, counseling, and coordination of care.  Signed:  Paige Vanderwoude Marry Guan, MD  [facility]  02/05/2016, 12:10 PM

## 2016-02-05 NOTE — Clinical Social Work Note (Signed)
Clinical Social Work Assessment  Patient Details  Name: Lance [name] MRN: [id number] Date of Birth: 03-05-1941  Date of referral:  02/05/16               Reason for consult:  Facility Placement                Permission sought to share information with:  Chartered certified accountant granted to share information::  Yes, Verbal Permission Granted  Name::        Agency::     Relationship::     Contact Information:     Housing/Transportation Living arrangements for the past 2 months:  Single Family Home Source of Information:  Patient Patient Interpreter Needed:  None Criminal Activity/Legal Involvement Pertinent to Current Situation/Hospitalization:  No - Comment as needed Significant Relationships:  Adult Children, Spouse Lives with:  Adult Children, Spouse Do you feel safe going back to the place where you live?  No Need for family participation in patient care:  No (Coment)  Care giving concerns:  CSW received consult from Dr. Renaee Munda that patient would need to go to SNF at discharge.    Social Worker assessment / plan:  CSW met with patient who informed CSW that he had been to [facility] for 99 days, discharging home on July 7. CSW called & spoke with Suanne Marker at [facility] to confirm that patient has had a 60 day wellness period in which his Medicare days would renew.   Employment status:  Retired Forensic scientist:  Medicare PT Recommendations:  No Follow Up Information / Referral to community resources:  [place]  Patient/Family's Response to care:  Patient is very grateful to be able to go to [facility] as he does not feel comfortable returning home at this time.   Patient/Family's Understanding of and Emotional Response to Diagnosis, Current Treatment, and Prognosis:  Patient is anxious about an upcoming appointment on Tuesday at [facility] plastic surgeon for his wound.   Emotional Assessment Appearance:  Appears stated  age Attitude/Demeanor/Rapport:    Affect (typically observed):  Accepting, Hopeful, Pleasant Orientation:  Oriented to Self, Oriented to Place, Oriented to  Time, Oriented to Situation Alcohol / Substance use:    Psych involvement (Current and /or in the community):     Discharge Needs  Concerns to be addressed:    Readmission within the last 30 days:    Current discharge risk:    Barriers to Discharge:      Standley Brooking, LCSW 02/05/2016, 11:20 AM

## 2016-02-05 NOTE — NC FL2 (Signed)
[place] LEVEL OF CARE SCREENING TOOL     IDENTIFICATION  Patient Name: Lance [name] Birthdate: Mar 31, 1941 Sex: male Admission Date (Current Location): 01/30/2016  [facility] and [place] Number:  Herbalist and Address:  [facility],  [place] [address] [place]      Provider Number: [id number]  Attending Physician Name and Address:  Mir Marry Guan,*  Relative Name and Phone Number:       Current Level of Care: Hospital Recommended Level of Care: [place] Prior Approval Number:    Date Approved/Denied:   PASRR Number: [id number]  Discharge Plan: Home    Current Diagnoses: Patient Active Problem List   Diagnosis Date Noted  . AKI (acute kidney injury) ([place]) 01/30/2016  . Cellulitis 01/30/2016  . Wound infection 01/30/2016  . Infected decubitus ulcer   . DM (diabetes mellitus) type II controlled with renal manifestation ([place]) 09/06/2015  . Dyslipidemia associated with type 2 diabetes mellitus ([place]) 09/06/2015  . Recurrent UTI (urinary tract infection) 09/06/2015  . Decubitus ulcer of right ischial area 06/14/2015  . Bed sore on hip 06/14/2015  . Left perineal ischial pressure ulcer   . Osteomyelitis of pelvis ([place])   . Urinary tract infectious disease   . Chronic kidney disease, stage 3 08/03/2013  . Essential hypertension, benign 08/03/2013  . Cellulitis of foot 08/02/2013  . Spastic quadriplegia ([place]) 07/10/2011  . Multiple sclerosis ([place]) 03/30/2011  . Sacral decubitus ulcer 03/30/2011  . Normocytic anemia 03/30/2011    Orientation RESPIRATION BLADDER Height & Weight     Self, Time, Situation, Place  Normal Indwelling catheter, Continent Weight: 180 lb 12.4 oz (82 kg) Height:  6\' 4"  (193 cm)  BEHAVIORAL SYMPTOMS/MOOD NEUROLOGICAL BOWEL NUTRITION STATUS      Incontinent Diet (Regular)  AMBULATORY STATUS COMMUNICATION OF NEEDS Skin   Extensive Assist Verbally PU Stage and  Appropriate Care (Pressure Injury 01/30/16 Unstageable - Full thickness tissue loss in which the base of the ulcer is covered by slough (yellow, tan, gray, green or brown) and/or eschar (tan, brown or black) in the wound bed. (right buttock))                       Personal Care Assistance Level of Assistance  Bathing, Dressing Bathing Assistance: Maximum assistance   Dressing Assistance: Maximum assistance     Functional Limitations Info             [place]  PT (By licensed PT), OT (By licensed OT)     PT Frequency: 5 OT Frequency: 5            Contractures      Additional Factors Info  Code Status, Allergies Code Status Info: Fullcode Allergies Info: NKDA           Current Medications (02/05/2016):  This is the current hospital active medication list Current Facility-Administered Medications  Medication Dose Route Frequency Provider Last Rate Last Dose  . acetaminophen (TYLENOL) tablet 650 mg  650 mg Oral Q6H PRN Vianne Bulls, MD   650 mg at 02/02/16 1402   Or  . acetaminophen (TYLENOL) suppository 650 mg  650 mg Rectal Q6H PRN Vianne Bulls, MD      . bisacodyl (DULCOLAX) suppository 10 mg  10 mg Rectal Daily PRN Vianne Bulls, MD      . cholecalciferol (VITAMIN D) tablet 2,000 Units  2,000 Units Oral Daily Ilene Qua  Opyd, MD   2,000 Units at 02/05/16 0801  . collagenase (SANTYL) ointment   Topical Daily Hosie Poisson, MD      . heparin injection 5,000 Units  5,000 Units Subcutaneous Q8H Vianne Bulls, MD   5,000 Units at 02/05/16 0547  . HYDROcodone-acetaminophen (NORCO/VICODIN) 5-325 MG per tablet 1-2 tablet  1-2 tablet Oral Q4H PRN Vianne Bulls, MD   2 tablet at 02/03/16 1407  . insulin aspart (novoLOG) injection 0-5 Units  0-5 Units Subcutaneous QHS Timothy S Opyd, MD      . insulin aspart (novoLOG) injection 0-9 Units  0-9 Units Subcutaneous TID WC Vianne Bulls, MD   2 Units at 02/04/16 1232  . insulin aspart (novoLOG)  injection 3 Units  3 Units Subcutaneous TID WC Vianne Bulls, MD   3 Units at 02/05/16 0755  . insulin detemir (LEVEMIR) injection 18 Units  18 Units Subcutaneous QHS Vianne Bulls, MD   18 Units at 02/04/16 2206  . multivitamin with minerals tablet 1 tablet  1 tablet Oral Daily Vianne Bulls, MD   1 tablet at 02/05/16 0801  . ondansetron (ZOFRAN) tablet 4 mg  4 mg Oral Q6H PRN Vianne Bulls, MD       Or  . ondansetron (ZOFRAN) injection 4 mg  4 mg Intravenous Q6H PRN Vianne Bulls, MD      . oxybutynin (DITROPAN) tablet 5 mg  5 mg Oral BID Vianne Bulls, MD   5 mg at 02/05/16 0801  . piperacillin-tazobactam (ZOSYN) IVPB 3.375 g  3.375 g Intravenous Q8H Emiliano Dyer, RPH   3.375 g at 02/05/16 0801  . protein supplement (PREMIER PROTEIN) liquid  11 oz Oral BID BM Hosie Poisson, MD   11 oz at 02/05/16 1000  . simvastatin (ZOCOR) tablet 20 mg  20 mg Oral QPM Vianne Bulls, MD   20 mg at 02/04/16 1834  . vancomycin (VANCOCIN) IVPB 1000 mg/200 mL premix  1,000 mg Intravenous Q36H Hosie Poisson, MD   1,000 mg at 02/04/16 2206     Discharge Medications: Please see discharge summary for a list of discharge medications.  Relevant Imaging Results:  Relevant Lab Results:   Additional Information SSN: [id number]  Standley Brooking, LCSW

## 2016-02-05 NOTE — Progress Notes (Signed)
PT Cancellation Note  Patient Details Name: Lance [name] MRN: [id number] [name]: Feb 05, 1941   Cancelled Treatment:    Reason Eval/Treat Not Completed: PT screened, no needs identified, will sign off- Please refer to PT note of 02/02/16. Patient requires total care for mobility. Currently,  With ischial decubitus, patient should not be sitting up. [name] PT [phone]    [name]. 02/05/2016, 8:19 AM

## 2016-02-06 ENCOUNTER — Non-Acute Institutional Stay (SKILLED_NURSING_FACILITY): Payer: Medicare Other | Admitting: Internal Medicine

## 2016-02-06 ENCOUNTER — Encounter: Payer: Self-pay | Admitting: Internal Medicine

## 2016-02-06 DIAGNOSIS — N390 Urinary tract infection, site not specified: Secondary | ICD-10-CM

## 2016-02-06 DIAGNOSIS — N183 Chronic kidney disease, stage 3 unspecified: Secondary | ICD-10-CM

## 2016-02-06 DIAGNOSIS — E1122 Type 2 diabetes mellitus with diabetic chronic kidney disease: Secondary | ICD-10-CM | POA: Diagnosis not present

## 2016-02-06 DIAGNOSIS — D649 Anemia, unspecified: Secondary | ICD-10-CM | POA: Diagnosis not present

## 2016-02-06 DIAGNOSIS — L89310 Pressure ulcer of right buttock, unstageable: Secondary | ICD-10-CM | POA: Diagnosis not present

## 2016-02-06 DIAGNOSIS — Z794 Long term (current) use of insulin: Secondary | ICD-10-CM | POA: Diagnosis not present

## 2016-02-06 NOTE — Assessment & Plan Note (Signed)
Complete Bactrim DS twice a day until 10/9. Reculture if febrile

## 2016-02-06 NOTE — Assessment & Plan Note (Signed)
Bactrim DS twice a day until 02/10/16

## 2016-02-06 NOTE — Assessment & Plan Note (Addendum)
Glucoses ranged 110-170 while hospitalized Hemoglobin A1c 6.1% on 08/02/15 A1c overdue

## 2016-02-06 NOTE — Progress Notes (Signed)
[facility] Room: [address] [name] [address] [place] [address]  Chief Complaint  Patient presents with  . New Admit To SNF    New Admit to [facility]   No Known Allergies   This is a comprehensive admission note to [facility] performed on this date less than 30 days from date of admission. Included are preadmission medical/surgical history;reconciled medication list; family history; social history and comprehensive review of systems.  Corrections and additions to the records were documented . Comprehensive physical exam was also performed. Additionally a clinical summary was entered for each active diagnosis pertinent to this admission in the Problem List to enhance continuity of care.  ZJI:RCVE Lance Montana, MD, [place] Wound Care:Dr Neysa Hotter , [facility]  Lance [name] was hospitalized 9/28-10/4/17 ,send to to the emergency room by Dr Dellia Nims, wound care specialist for evaluation of right ischial pressure ulcer with secondary bacterial infection and possible osteomyelitis. Osteomyelitis was not suggested on imaging. Bedside debridement was completed 10/2. No evidence of active deeper infection was found & he was placed on empiric vancomycin and Zosyn for a total of 6 days for wound infection and possible UTI. Urine culture revealed over 100,000 colonies of viridans streptococcus & Klebsiella pneumoniae. The wound culture 9/28 revealed few methicillin-resistant staph aureus as well as few enterococcus faecalis Discharged to continue Bactrim DS twice a day for 4 additional days . At the time of discharge creatinine was 1.82 and BUN 44.The white count had normalized. Glucoses ranged 110-170. PT initiated hydrotherapy. Air mattress was implemented.  Past medical and surgical history: Positive for multiple sclerosis with spastic paraplegia, chronic kidney disease stage III, insulin-dependent diabetes, chronic self catheter with recurrent UTIs and  hypertension. He has had lithotripsy for renal calculi. He has a diagnosis of anemia of chronic disease. He did receive 1 unit of packed red cells during the admission with hemoglobin stabilizing at  8.5.  Social history: He quit smoking remotely. He states he will have 1-2 drinks occasionally.  Family history: Diabetes and stroke in his father ;diabetes paternal grandmother, stroke paternal grandfather. No heart attack or cancer.  Review of systems: He stated he did have chills at the time of admission but denies any signs of infection at this time. He has decreased sensation mainly in the lower extremities. He does not feel pain in the area of the wound but calls it "discomfort". He admits to reactional depression with the recent events. He previously was followed for MS at [facility] but found Betaseron was not of benefit & not return for follow-up.  Constitutional: No current fever,significant weight change, fatigue  Eyes: No redness, discharge, pain, vision change ENT/mouth: No nasal congestion,  purulent discharge, earache,change in hearing ,sore throat  Cardiovascular: No chest pain, palpitations,paroxysmal nocturnal dyspnea, claudication, edema  Respiratory: No cough, sputum production,hemoptysis, DOE , significant snoring,apnea   Gastrointestinal: No heartburn,dysphagia,abdominal pain, nausea / vomiting,rectal bleeding, melena,change in bowels Genitourinary: No dysuria,hematuria, pyuria,  incontinence, nocturia Musculoskeletal: No joint stiffness, joint swelling, weakness,pain Dermatologic: No rash, pruritus, change in appearance of skin other than ulcer Neurologic: No dizziness,headache,syncope, seizures, numbness , tingling Endocrine: No change in hair/skin/ nails, excessive thirst, excessive hunger, excessive urination  Hematologic/lymphatic: No significant bruising, lymphadenopathy,abnormal bleeding Allergy/immunology: No itchy/ watery eyes, significant sneezing, urticaria,  angioedema  Physical exam:  Pertinent or positive findings: Pattern alopecia is present. He has generalized muscle atrophy  of the upper extremities and lower extremities. There is marked decreased range of motion of the  lower extremities. Foley catheter is present. There is an occlusive dressing over the right lateral ankle. The toenails are thickened and slightly discolored. When the legs were examined there was some involuntary movement. Ischial wound will be dressed by Wound Care Nurse.  General appearance:Adequately nourished; no acute distress , increased work of breathing is present.   Lymphatic: No lymphadenopathy about the head, neck, axilla . Eyes: No conjunctival inflammation or lid edema is present. There is no scleral icterus. Ears:  External ear exam shows no significant lesions or deformities.   Nose:  External nasal examination shows no deformity or inflammation. Nasal mucosa are pink and moist without lesions ,exudates Oral exam: lips and gums are healthy appearing.There is no oropharyngeal erythema or exudate . Neck:  No thyromegaly, masses, tenderness noted.    Heart:  Normal rate and regular rhythm. S1 and S2 normal without gallop, murmur, click, rub .  Lungs:Chest clear to auscultation without wheezes, rhonchi,rales , rubs. Abdomen:Bowel sounds are normal. Abdomen is soft and nontender with no organomegaly, hernias,masses. GU: deferred  Extremities:  No cyanosis, clubbing,edema  Neurologic exam : Balance,Rhomberg,finger to nose testing could not be completed due to clinical state Skin: Warm & dry w/o tenting. No significant rash.  See clinical summary under each active problem in the Problem List with associated updated therapeutic plan

## 2016-02-06 NOTE — Patient Instructions (Signed)
New orders : A1c Ferritin

## 2016-02-06 NOTE — Assessment & Plan Note (Addendum)
Bleeding dyscrasias denied; most likely diagnosis is anemia of chronic disease Monitor for active bleeding Recheck ferritin

## 2016-02-07 LAB — CBC AND DIFFERENTIAL
HEMATOCRIT: 26 % — AB (ref 41–53)
HEMOGLOBIN: 8.4 g/dL — AB (ref 13.5–17.5)
Platelets: 322 10*3/uL (ref 150–399)
WBC: 6.8 10*3/mL

## 2016-02-11 DIAGNOSIS — L89314 Pressure ulcer of right buttock, stage 4: Secondary | ICD-10-CM | POA: Diagnosis not present

## 2016-02-11 LAB — HEMOGLOBIN A1C: HEMOGLOBIN A1C: 5.8

## 2016-02-13 ENCOUNTER — Non-Acute Institutional Stay (SKILLED_NURSING_FACILITY): Payer: Medicare Other | Admitting: Internal Medicine

## 2016-02-13 ENCOUNTER — Encounter: Payer: Self-pay | Admitting: Internal Medicine

## 2016-02-13 ENCOUNTER — Inpatient Hospital Stay (HOSPITAL_COMMUNITY)
Admission: EM | Admit: 2016-02-13 | Discharge: 2016-02-27 | DRG: 291 | Disposition: A | Discharge status: Other Acute Inpatient Hospital | Payer: Medicare Other | Attending: Family Medicine | Admitting: Family Medicine

## 2016-02-13 DIAGNOSIS — L89204 Pressure ulcer of unspecified hip, stage 4: Secondary | ICD-10-CM | POA: Diagnosis present

## 2016-02-13 DIAGNOSIS — J9600 Acute respiratory failure, unspecified whether with hypoxia or hypercapnia: Secondary | ICD-10-CM

## 2016-02-13 DIAGNOSIS — Z992 Dependence on renal dialysis: Secondary | ICD-10-CM

## 2016-02-13 DIAGNOSIS — G35D Multiple sclerosis, unspecified: Secondary | ICD-10-CM | POA: Diagnosis present

## 2016-02-13 DIAGNOSIS — L89150 Pressure ulcer of sacral region, unstageable: Secondary | ICD-10-CM | POA: Diagnosis not present

## 2016-02-13 DIAGNOSIS — F32A Depression, unspecified: Secondary | ICD-10-CM | POA: Diagnosis present

## 2016-02-13 DIAGNOSIS — E1122 Type 2 diabetes mellitus with diabetic chronic kidney disease: Secondary | ICD-10-CM | POA: Diagnosis present

## 2016-02-13 DIAGNOSIS — L89154 Pressure ulcer of sacral region, stage 4: Secondary | ICD-10-CM | POA: Diagnosis not present

## 2016-02-13 DIAGNOSIS — N183 Chronic kidney disease, stage 3 unspecified: Secondary | ICD-10-CM | POA: Diagnosis present

## 2016-02-13 DIAGNOSIS — J811 Chronic pulmonary edema: Secondary | ICD-10-CM | POA: Diagnosis not present

## 2016-02-13 DIAGNOSIS — R0602 Shortness of breath: Secondary | ICD-10-CM | POA: Diagnosis not present

## 2016-02-13 DIAGNOSIS — N170 Acute kidney failure with tubular necrosis: Secondary | ICD-10-CM | POA: Diagnosis not present

## 2016-02-13 DIAGNOSIS — F411 Generalized anxiety disorder: Secondary | ICD-10-CM | POA: Diagnosis present

## 2016-02-13 DIAGNOSIS — N390 Urinary tract infection, site not specified: Secondary | ICD-10-CM

## 2016-02-13 DIAGNOSIS — L89314 Pressure ulcer of right buttock, stage 4: Secondary | ICD-10-CM | POA: Diagnosis present

## 2016-02-13 DIAGNOSIS — R778 Other specified abnormalities of plasma proteins: Secondary | ICD-10-CM | POA: Diagnosis present

## 2016-02-13 DIAGNOSIS — N184 Chronic kidney disease, stage 4 (severe): Secondary | ICD-10-CM | POA: Diagnosis present

## 2016-02-13 DIAGNOSIS — E43 Unspecified severe protein-calorie malnutrition: Secondary | ICD-10-CM | POA: Diagnosis present

## 2016-02-13 DIAGNOSIS — Z7982 Long term (current) use of aspirin: Secondary | ICD-10-CM

## 2016-02-13 DIAGNOSIS — I132 Hypertensive heart and chronic kidney disease with heart failure and with stage 5 chronic kidney disease, or end stage renal disease: Principal | ICD-10-CM | POA: Diagnosis present

## 2016-02-13 DIAGNOSIS — D509 Iron deficiency anemia, unspecified: Secondary | ICD-10-CM | POA: Diagnosis present

## 2016-02-13 DIAGNOSIS — F329 Major depressive disorder, single episode, unspecified: Secondary | ICD-10-CM

## 2016-02-13 DIAGNOSIS — D649 Anemia, unspecified: Secondary | ICD-10-CM | POA: Diagnosis not present

## 2016-02-13 DIAGNOSIS — Z794 Long term (current) use of insulin: Secondary | ICD-10-CM

## 2016-02-13 DIAGNOSIS — G8250 Quadriplegia, unspecified: Secondary | ICD-10-CM | POA: Diagnosis present

## 2016-02-13 DIAGNOSIS — R0902 Hypoxemia: Secondary | ICD-10-CM

## 2016-02-13 DIAGNOSIS — Z6820 Body mass index (BMI) 20.0-20.9, adult: Secondary | ICD-10-CM

## 2016-02-13 DIAGNOSIS — I429 Cardiomyopathy, unspecified: Secondary | ICD-10-CM

## 2016-02-13 DIAGNOSIS — G35 Multiple sclerosis: Secondary | ICD-10-CM | POA: Diagnosis present

## 2016-02-13 DIAGNOSIS — I248 Other forms of acute ischemic heart disease: Secondary | ICD-10-CM | POA: Diagnosis present

## 2016-02-13 DIAGNOSIS — Z87440 Personal history of urinary (tract) infections: Secondary | ICD-10-CM

## 2016-02-13 DIAGNOSIS — N059 Unspecified nephritic syndrome with unspecified morphologic changes: Secondary | ICD-10-CM | POA: Diagnosis present

## 2016-02-13 DIAGNOSIS — E875 Hyperkalemia: Secondary | ICD-10-CM | POA: Diagnosis not present

## 2016-02-13 DIAGNOSIS — N186 End stage renal disease: Secondary | ICD-10-CM

## 2016-02-13 DIAGNOSIS — I5023 Acute on chronic systolic (congestive) heart failure: Secondary | ICD-10-CM | POA: Diagnosis present

## 2016-02-13 DIAGNOSIS — Z87891 Personal history of nicotine dependence: Secondary | ICD-10-CM

## 2016-02-13 DIAGNOSIS — R7989 Other specified abnormal findings of blood chemistry: Secondary | ICD-10-CM | POA: Diagnosis present

## 2016-02-13 DIAGNOSIS — J810 Acute pulmonary edema: Secondary | ICD-10-CM

## 2016-02-13 DIAGNOSIS — L89159 Pressure ulcer of sacral region, unspecified stage: Secondary | ICD-10-CM | POA: Diagnosis present

## 2016-02-13 DIAGNOSIS — M86151 Other acute osteomyelitis, right femur: Secondary | ICD-10-CM | POA: Diagnosis present

## 2016-02-13 DIAGNOSIS — T17908A Unspecified foreign body in respiratory tract, part unspecified causing other injury, initial encounter: Secondary | ICD-10-CM

## 2016-02-13 DIAGNOSIS — Z993 Dependence on wheelchair: Secondary | ICD-10-CM

## 2016-02-13 DIAGNOSIS — L89319 Pressure ulcer of right buttock, unspecified stage: Secondary | ICD-10-CM | POA: Diagnosis present

## 2016-02-13 DIAGNOSIS — N179 Acute kidney failure, unspecified: Secondary | ICD-10-CM

## 2016-02-13 DIAGNOSIS — L98429 Non-pressure chronic ulcer of back with unspecified severity: Secondary | ICD-10-CM

## 2016-02-13 DIAGNOSIS — J9601 Acute respiratory failure with hypoxia: Secondary | ICD-10-CM | POA: Diagnosis not present

## 2016-02-13 DIAGNOSIS — Z419 Encounter for procedure for purposes other than remedying health state, unspecified: Secondary | ICD-10-CM

## 2016-02-13 DIAGNOSIS — I509 Heart failure, unspecified: Secondary | ICD-10-CM

## 2016-02-13 DIAGNOSIS — T83510A Infection and inflammatory reaction due to cystostomy catheter, initial encounter: Secondary | ICD-10-CM

## 2016-02-13 DIAGNOSIS — I428 Other cardiomyopathies: Secondary | ICD-10-CM

## 2016-02-13 DIAGNOSIS — I10 Essential (primary) hypertension: Secondary | ICD-10-CM | POA: Diagnosis present

## 2016-02-13 DIAGNOSIS — E1169 Type 2 diabetes mellitus with other specified complication: Secondary | ICD-10-CM | POA: Diagnosis present

## 2016-02-13 DIAGNOSIS — D631 Anemia in chronic kidney disease: Secondary | ICD-10-CM | POA: Diagnosis present

## 2016-02-13 DIAGNOSIS — E1129 Type 2 diabetes mellitus with other diabetic kidney complication: Secondary | ICD-10-CM | POA: Diagnosis present

## 2016-02-13 DIAGNOSIS — I5021 Acute systolic (congestive) heart failure: Secondary | ICD-10-CM

## 2016-02-13 DIAGNOSIS — N319 Neuromuscular dysfunction of bladder, unspecified: Secondary | ICD-10-CM | POA: Diagnosis present

## 2016-02-13 DIAGNOSIS — Z79899 Other long term (current) drug therapy: Secondary | ICD-10-CM

## 2016-02-13 DIAGNOSIS — Z792 Long term (current) use of antibiotics: Secondary | ICD-10-CM

## 2016-02-13 DIAGNOSIS — M8610 Other acute osteomyelitis, unspecified site: Secondary | ICD-10-CM | POA: Diagnosis present

## 2016-02-13 HISTORY — DX: Other acute osteomyelitis, right femur: M86.151

## 2016-02-13 HISTORY — DX: Heart failure, unspecified: I50.9

## 2016-02-13 HISTORY — DX: Acute kidney failure, unspecified: N17.9

## 2016-02-13 HISTORY — DX: Pressure ulcer of unspecified hip, stage 4: L89.204

## 2016-02-13 HISTORY — DX: Cardiomyopathy, unspecified: I42.9

## 2016-02-13 NOTE — Patient Instructions (Signed)
D/C ac insulin  Monitor fasting blood glucose  Ferritin level Ferrous sulfate 325 mg Sertraline titration

## 2016-02-13 NOTE — ED Notes (Signed)
Bed: KH57 Expected date:  Expected time:  Means of arrival:  Comments: EMS 75 yo male from [facility]-SOB 1 hour ago-O2 sat 88%/O2 2l/[place] with sat 95%

## 2016-02-13 NOTE — Assessment & Plan Note (Signed)
Sertraline 25 mg daily with titration as indicated

## 2016-02-13 NOTE — ED Provider Notes (Signed)
[facility] DEPT Provider Note   CSN: [id number] Arrival date & time: 02/13/16  2355  By signing my name below, I, Rayna Sexton, attest that this documentation has been prepared under the direction and in the presence of Nalda Shackleford, MD. Electronically Signed: Rayna Sexton, ED Scribe. 02/13/16. 12:25 AM.   History   Chief Complaint Chief Complaint  Patient presents with  . Respiratory Distress   HPI HPI Comments: LUCA DYAR is a 75 y.o. male with a h/o MS who presents to the Emergency Department by ambulance from [facility] complaining of intermittent SOB onset this evening. He denies a h/o SOB and this evening "just couldn't catch his breath" while sleeping. He states he was nervous and the staff took his pulse ox which was 82% at the time. He was given O2 which he states provided relief and since EMS had already arrived pt requested to be taken to the ED for evaluation. He reports an associated, mild, cough. Pt was admitted recently for a UTI and is unsure if he is currently taking abx. He denies wheezing.    The history is provided by the patient. No language interpreter was used.  Shortness of Breath  This is a new problem. The average episode lasts 1 hour. The problem occurs rarely.The current episode started 3 to 5 hours ago. The problem has been resolved. Associated symptoms include cough. Pertinent negatives include no fever, no sputum production, no wheezing, no chest pain, no syncope, no abdominal pain and no leg swelling. It is unknown what precipitated the problem. Treatments tried: O2. The treatment provided significant relief. Associated medical issues do not include asthma, COPD or DVT.    Past Medical History:  Diagnosis Date  . Anemia in chronic renal disease   . Blood transfusion   . Chronic spastic paraplegia (HCC)    secondary to MS  . CKD (chronic kidney disease), stage III   . Decreased sensation of lower extremity    due to MS   . Decubitus ulcer of ischium   . History of MRSA infection    urine  . History of recurrent UTIs   . Hypertension   . MS (multiple sclerosis) ([place])   . Neuralgia neuritis, sciatic nerve    MS for 30 years  . Neurogenic bladder   . PONV (postoperative nausea and vomiting)   . Self-catheterizes urinary bladder   . Type 2 diabetes mellitus [facility])     Patient Active Problem List   Diagnosis Date Noted  . Depression 02/13/2016  . AKI (acute kidney injury) ([place]) 01/30/2016  . DM (diabetes mellitus) type II controlled with renal manifestation ([place]) 09/06/2015  . Dyslipidemia associated with type 2 diabetes mellitus ([place]) 09/06/2015  . Recurrent UTI (urinary tract infection) 09/06/2015  . Decubitus ulcer of right ischial area 06/14/2015  . Left perineal ischial pressure ulcer   . Chronic kidney disease, stage 3 08/03/2013  . Essential hypertension, benign 08/03/2013  . Cellulitis of foot 08/02/2013  . Spastic quadriplegia ([place]) 07/10/2011  . Multiple sclerosis ([place]) 03/30/2011  . Sacral decubitus ulcer 03/30/2011  . Normocytic anemia 03/30/2011    Past Surgical History:  Procedure Laterality Date  . APPLICATION OF A-CELL OF EXTREMITY Left 09/05/2014   Procedure: PLACEMENT OF ACELL AND VAC;  Surgeon: Theodoro Kos, DO;  Location: [facility];  Service: Plastics;  Laterality: Left;  . EXTRACORPOREAL SHOCK WAVE LITHOTRIPSY  03-23-2011  . FINGER SURGERY  2004  . INCISION AND DRAINAGE OF WOUND Left 09/05/2014  Procedure: IRRIGATION AND DEBRIDEMENT OF LEFT ISCHIUM WOUND WITH ;  Surgeon: Theodoro Kos, DO;  Location: [facility];  Service: Plastics;  Laterality: Left;  . TONSILLECTOMY    . WOUND DEBRIDEMENT     [facility]2/17[facility]        Home Medications    Prior to Admission medications   Medication Sig Start Date End Date Taking? Authorizing Provider  Ascorbic Acid (VITAMIN C PO) Take 1 tablet by mouth daily.     Historical Provider, MD  aspirin 81 MG tablet Take 1 tablet (81 mg total) by mouth  daily. 03/30/11   Rolan Bucco, MD  bisacodyl (DULCOLAX) 10 MG suppository Place 1 suppository (10 mg total) rectally daily as needed for moderate constipation. 08/06/13   Josetta Huddle, MD  Cholecalciferol (VITAMIN D3) 2000 UNITS TABS Take 2,000 mg by mouth daily.      Historical Provider, MD  collagenase (SANTYL) ointment Apply topically daily. 02/06/16   Mir Marry Guan, MD  fish oil-omega-3 fatty acids 1000 MG capsule Take 1 capsule (1 g total) by mouth daily. 03/30/11   Rolan Bucco, MD  HYDROcodone-acetaminophen (NORCO/VICODIN) 5-325 MG tablet Take 1-2 tablets by mouth every 4 (four) hours as needed for moderate pain. 02/05/16   Mir Marry Guan, MD  insulin aspart (NOVOLOG FLEXPEN) 100 UNIT/ML FlexPen Inject 8 Units into the skin 3 (three) times daily with meals. Patient taking differently: Inject 8 Units into the skin 3 (three) times daily with meals. D/C on 02/13/2016 09/07/14   Domenic Polite, MD  LEVEMIR FLEXTOUCH 100 UNIT/ML Pen Inject 18 Units into the skin daily.  01/28/16   Historical Provider, MD  Multiple Vitamin (MULTIVITAMIN WITH MINERALS) TABS tablet Take 1 tablet by mouth daily.    Historical Provider, MD  oxybutynin (DITROPAN) 5 MG tablet Take 5 mg by mouth 2 (two) times daily.     Historical Provider, MD  protein supplement shake (PREMIER PROTEIN) LIQD Take 325 mLs (11 oz total) by mouth 2 (two) times daily between meals. 02/05/16 03/06/16  Mir Marry Guan, MD  simvastatin (ZOCOR) 20 MG tablet Take 20 mg by mouth every evening.    Historical Provider, MD    Family History Family History  Problem Relation Age of Onset  . Stroke Father   . Diabetes Father   . Diabetes Paternal Grandmother   . Stroke Paternal Grandfather   . Heart disease Neg Hx   . Cancer Neg Hx     Social History Social History  Substance Use Topics  . Smoking status: Former Smoker    Types: Cigars    Quit date: 05/06/1983  . Smokeless tobacco: Never Used  . Alcohol use 1.8  oz/week    3 Glasses of wine per week     Comment: 3 glasses of wine per week     Allergies   Review of patient's allergies indicates no known allergies.   Review of Systems Review of Systems  Constitutional: Negative for diaphoresis and fever.  Respiratory: Positive for cough and shortness of breath. Negative for sputum production and wheezing.   Cardiovascular: Negative for chest pain, leg swelling and syncope.  Gastrointestinal: Negative for abdominal pain.  All other systems reviewed and are negative.  Physical Exam Updated Vital Signs BP 184/89   Pulse 99   Temp 98.2 F (36.8 C) (Oral)   Resp 17   SpO2 94%   Physical Exam  Constitutional: He is oriented to person, place, and time.  HENT:  Head: Normocephalic and atraumatic.  Mouth/Throat: Oropharynx  is clear and moist. No oropharyngeal exudate.  Eyes: Conjunctivae and EOM are normal. Pupils are equal, round, and reactive to light.  Neck: Normal range of motion. Neck supple. No JVD present. No tracheal deviation present.  No stridor. No bruits. Trachea midline. No lymphadenopathy.   Cardiovascular: Normal rate, regular rhythm, normal heart sounds and intact distal pulses.   Pulmonary/Chest: Effort normal. No stridor. No respiratory distress. He has decreased breath sounds in the right lower field and the left lower field. He has no wheezes. He has no rales.  Abdominal: Soft. Bowel sounds are normal. He exhibits no distension and no mass. There is no tenderness. There is no rebound and no guarding.  Musculoskeletal: Normal range of motion.  Lymphadenopathy:    He has no cervical adenopathy.  Neurological: He is alert and oriented to person, place, and time. He has normal reflexes. He displays normal reflexes.  Skin: Skin is warm and dry. Capillary refill takes less than 2 seconds.  Psychiatric: He has a normal mood and affect.  Nursing note and vitals reviewed.  ED Treatments / Results   Vitals:   02/14/16 0100  02/14/16 0130  BP:    Pulse: 103 100  Resp: 19 22  Temp:     Results for orders placed or performed during the hospital encounter of 02/13/16  CBC with Differential  Result Value Ref Range   WBC 14.2 (H) 4.0 - 10.5 K/uL   RBC 3.24 (L) 4.22 - 5.81 MIL/uL   Hemoglobin 9.7 (L) 13.0 - 17.0 g/dL   HCT 28.2 (L) 39.0 - 52.0 %   MCV 87.0 78.0 - 100.0 fL   MCH 29.9 26.0 - 34.0 pg   MCHC 34.4 30.0 - 36.0 g/dL   RDW 16.3 (H) 11.5 - 15.5 %   Platelets 224 150 - 400 K/uL   Neutrophils Relative % 78 %   Neutro Abs 11.1 (H) 1.7 - 7.7 K/uL   Lymphocytes Relative 13 %   Lymphs Abs 1.9 0.7 - 4.0 K/uL   Monocytes Relative 8 %   Monocytes Absolute 1.1 (H) 0.1 - 1.0 K/uL   Eosinophils Relative 1 %   Eosinophils Absolute 0.1 0.0 - 0.7 K/uL   Basophils Relative 0 %   Basophils Absolute 0.1 0.0 - 0.1 K/uL  Comprehensive metabolic panel  Result Value Ref Range   Sodium 137 135 - 145 mmol/L   Potassium 4.5 3.5 - 5.1 mmol/L   Chloride 112 (H) 101 - 111 mmol/L   CO2 17 (L) 22 - 32 mmol/L   Glucose, Bld 143 (H) 65 - 99 mg/dL   BUN 90 (H) 6 - 20 mg/dL   Creatinine, Ser 3.30 (H) 0.61 - 1.24 mg/dL   Calcium 8.4 (L) 8.9 - 10.3 mg/dL   Total Protein 7.0 6.5 - 8.1 g/dL   Albumin 2.5 (L) 3.5 - 5.0 g/dL   AST 18 15 - 41 U/L   ALT 15 (L) 17 - 63 U/L   Alkaline Phosphatase 68 38 - 126 U/L   Total Bilirubin 0.8 0.3 - 1.2 mg/dL   GFR calc non Af Amer 17 (L) >60 mL/min   GFR calc Af Amer 20 (L) >60 mL/min   Anion gap 8 5 - 15  Troponin I  Result Value Ref Range   Troponin I 0.15 (HH) <0.03 ng/mL  Urinalysis, Routine w reflex microscopic (not at [facility])  Result Value Ref Range   Color, Urine YELLOW YELLOW   APPearance TURBID (A) CLEAR   Specific  Gravity, Urine 1.020 1.005 - 1.030   pH 5.5 5.0 - 8.0   Glucose, UA NEGATIVE NEGATIVE mg/dL   Hgb urine dipstick LARGE (A) NEGATIVE   Bilirubin Urine NEGATIVE NEGATIVE   Ketones, ur NEGATIVE NEGATIVE mg/dL   Protein, ur >300 (A) NEGATIVE mg/dL   Nitrite  POSITIVE (A) NEGATIVE   Leukocytes, UA LARGE (A) NEGATIVE  Urine microscopic-add on  Result Value Ref Range   Squamous Epithelial / LPF 0-5 (A) NONE SEEN   WBC, UA 6-30 0 - 5 WBC/hpf   RBC / HPF 6-30 0 - 5 RBC/hpf   Bacteria, UA FEW (A) NONE SEEN  I-Stat Troponin, ED (not at [facility])  Result Value Ref Range   Troponin i, poc 0.16 (HH) 0.00 - 0.08 ng/mL   Comment NOTIFIED PHYSICIAN    Comment 3           Dg Chest 2 View  Result Date: 02/14/2016 CLINICAL DATA:  Acute onset of wheezing and shortness of breath. Initial encounter. EXAM: CHEST  2 VIEW COMPARISON:  Chest radiograph performed 08/30/2014 FINDINGS: Vascular congestion is noted, with bilateral central airspace opacification and small bilateral pleural effusions, compatible with pulmonary edema. No pneumothorax is seen. The cardiomediastinal silhouette is borderline normal in size. No acute osseous abnormalities are identified. IMPRESSION: Vascular congestion, with bilateral central airspace opacification and small bilateral pleural effusions, compatible with pulmonary edema. Electronically Signed   By: Garald Balding M.D.   On: 02/14/2016 01:35   Dg Hip Unilat With Pelvis 2-3 Views Right  Result Date: 01/30/2016 CLINICAL DATA:  75 year old with approximate 6 week history of a decubitus ulcer involving the right buttock, with recent worsening of the infection. EXAM: DG HIP (WITH OR WITHOUT PELVIS) 2-3V RIGHT COMPARISON:  Right hip x-rays 05/28/2015.  MRI pelvis 09/01/2014. FINDINGS: No evidence of acute fracture or dislocation. No evidence of osteomyelitis. No visible joint effusion. Moderate joint space narrowing. Bone mineral density well-preserved. Included AP pelvis again shows the healed osteomyelitis with new bone formation at the left ischium. Contralateral left hip joint with symmetric moderate joint space narrowing. Sacroiliac joints and symphysis pubis intact. Mild degenerative changes involving the visualized lower lumbar spine.  Penile prosthesis again noted. IMPRESSION: No acute osseous abnormality. No radiographic evidence of acute osteomyelitis. Electronically Signed   By: Evangeline Dakin M.D.   On: 01/30/2016 20:36   Medications  cefTRIAXone (ROCEPHIN) 1 g in dextrose 5 % 50 mL IVPB (1 g Intravenous New Bag/Given 02/14/16 0155)  vancomycin (VANCOCIN) IVPB 1000 mg/200 mL premix (not administered)     EKG Interpretation  Date/Time:  Friday February 14 2016 00:06:06 EDT Ventricular Rate:  100 PR Interval:    QRS Duration: 96 QT Interval:  365 QTC Calculation: 471 R Axis:   84 Text Interpretation:  Sinus tachycardia Confirmed by [facility]  MD, Emmaline Kluver ([id number]) on 02/14/2016 12:13:34 AM       Radiology No results found.  Procedures Procedures  DIAGNOSTIC STUDIES: Oxygen Saturation is 94% on RA, adequate by my interpretation.    COORDINATION OF CARE: 12:23 AM Discussed next steps with pt. Pt verbalized understanding and is agreeable with the plan.    Medications Ordered in ED Medications - No data to display  Per Dr. Loleta Books wait on Lasix he would like to see the patient.   Initial Impression / Assessment and Plan / ED Course  I have reviewed the triage vital signs and the nursing notes.  Pertinent labs & imaging results that were available during my care  of the patient were reviewed by me and considered in my medical decision making (see chart for details).  I personally performed the services described in this documentation, which was scribed in my presence. The recorded information has been reviewed and is accurate.    Final Clinical Impressions(s) / ED Diagnoses   Final diagnoses:  None    New Prescriptions New Prescriptions   No medications on file     Fidela Cieslak, MD 02/14/16 0225

## 2016-02-13 NOTE — Assessment & Plan Note (Signed)
With an A1c of 5.8% and poor oral intake; the premeal insulins will be discontinued  no change in long-acting insulin unless his fasting blood sugar averages over 200

## 2016-02-13 NOTE — Progress Notes (Signed)
   This is a nursing facility follow up for specific  issue of Diabetes.  Interim medical record and care since last [facility] visit was updated with review of diagnostic studies and change in clinical status since last visit were documented.  HPI: The patient is on NovoLog 8 units before each meal as well as Levemir 18 units once daily. Glucoses on record range from 89-151. A1c 02/11/16 revealed a nondiabetic A1c of 5.8%. He does have renal insufficiency with creatinines varying from 1.75-1.82. Additionally he has a normochromic, normocytic anemia which appears to be stable to improving. Hematocrit was 25.6 on 10/4. B12 level was supernormal and iron low in 2016. Ferritin was normal at that time.  Review of systems: Prior to admission fasting blood sugars typically 140. He would hold his pre-meal insulin if the sugars were 60-65 which they were on occasion. He states that typically his sugars would be in the 90s before the evening meal and approximately 110 at bedtime. The numbness in his lower extremities is a chronic issue related to MS. He denies any symptoms in the upper extremities. Today he describes a dry cough with dry heaves . He denies any upper or other lower respiratory tract symptoms. He also denies any other GI symptoms. He has no history of asthma or bronchitis. Appetite is extremely poor.He admits to depression Despite the anemia he has no bleeding dyscrasias.  ENT/mouth: No nasal congestion,  purulent discharge, earache,change in hearing ,sore throat  Cardiovascular: No chest pain, palpitations,paroxysmal nocturnal dyspnea, claudication, edema  Respiratory: No sputum production,hemoptysis, DOE , significant snoring,apnea   Gastrointestinal: No heartburn,dysphagia,abdominal pain,  vomiting,rectal bleeding, melena,change in bowels Genitourinary: No dysuria,hematuria, pyuria,  incontinence, nocturia.Foley in place Endocrine: No change in hair/skin/ nails, excessive  thirst, excessive hunger, excessive urination  Hematologic/lymphatic: No significant bruising, lymphadenopathy,abnormal bleeding Allergy/immunology: No itchy/ watery eyes, significant sneezing, urticaria, angioedema  Pertinent or positive findings include: he is slightly hoarse. Posterior tibial pulses are slightly decreased. With exam of feet there was involuntary withdrawal. He has atrophy of the interosseous and lower extremity musculature. General appearance :adequately nourished; in no distress. Eyes: No conjunctival inflammation or scleral icterus is present. Oral exam:  Lips and gums are healthy appearing.There is no oropharyngeal erythema or exudate noted. Dental hygiene is good. Heart:  Normal rate and regular rhythm. S1 and S2 normal without gallop, murmur, click, rub or other extra sounds   Lungs:Chest clear to auscultation; no wheezes, rhonchi,rales ,or rubs present.No increased work of breathing.  Abdomen: bowel sounds normal, soft and non-tender without masses, organomegaly or hernias noted.  No guarding or rebound. No flank tenderness to percussion. Vascular : all pulses equal ; no bruits present. Skin:Warm & dry;no tenting or jaundice  Lymphatic: No lymphadenopathy is noted about the head, neck, axilla  See summary under each active problem in the Problem List with associated updated therapeutic plan

## 2016-02-13 NOTE — Assessment & Plan Note (Addendum)
Check ferritin  Ferrous sulfate 325 mg daily

## 2016-02-14 ENCOUNTER — Encounter (HOSPITAL_COMMUNITY): Payer: Self-pay | Admitting: Emergency Medicine

## 2016-02-14 ENCOUNTER — Inpatient Hospital Stay (HOSPITAL_COMMUNITY): Payer: Medicare Other

## 2016-02-14 ENCOUNTER — Emergency Department (HOSPITAL_COMMUNITY): Payer: Medicare Other

## 2016-02-14 ENCOUNTER — Other Ambulatory Visit: Payer: Self-pay

## 2016-02-14 DIAGNOSIS — J9600 Acute respiratory failure, unspecified whether with hypoxia or hypercapnia: Secondary | ICD-10-CM | POA: Diagnosis not present

## 2016-02-14 DIAGNOSIS — B9689 Other specified bacterial agents as the cause of diseases classified elsewhere: Secondary | ICD-10-CM | POA: Diagnosis not present

## 2016-02-14 DIAGNOSIS — I151 Hypertension secondary to other renal disorders: Secondary | ICD-10-CM | POA: Diagnosis not present

## 2016-02-14 DIAGNOSIS — I429 Cardiomyopathy, unspecified: Secondary | ICD-10-CM | POA: Diagnosis not present

## 2016-02-14 DIAGNOSIS — E875 Hyperkalemia: Secondary | ICD-10-CM | POA: Diagnosis not present

## 2016-02-14 DIAGNOSIS — G8250 Quadriplegia, unspecified: Secondary | ICD-10-CM

## 2016-02-14 DIAGNOSIS — R748 Abnormal levels of other serum enzymes: Secondary | ICD-10-CM | POA: Diagnosis not present

## 2016-02-14 DIAGNOSIS — M8610 Other acute osteomyelitis, unspecified site: Secondary | ICD-10-CM | POA: Diagnosis not present

## 2016-02-14 DIAGNOSIS — N289 Disorder of kidney and ureter, unspecified: Secondary | ICD-10-CM | POA: Diagnosis not present

## 2016-02-14 DIAGNOSIS — I5021 Acute systolic (congestive) heart failure: Secondary | ICD-10-CM | POA: Diagnosis not present

## 2016-02-14 DIAGNOSIS — L89220 Pressure ulcer of left hip, unstageable: Secondary | ICD-10-CM | POA: Diagnosis not present

## 2016-02-14 DIAGNOSIS — F411 Generalized anxiety disorder: Secondary | ICD-10-CM | POA: Diagnosis present

## 2016-02-14 DIAGNOSIS — I509 Heart failure, unspecified: Secondary | ICD-10-CM

## 2016-02-14 DIAGNOSIS — L89153 Pressure ulcer of sacral region, stage 3: Secondary | ICD-10-CM

## 2016-02-14 DIAGNOSIS — N185 Chronic kidney disease, stage 5: Secondary | ICD-10-CM | POA: Diagnosis not present

## 2016-02-14 DIAGNOSIS — E1122 Type 2 diabetes mellitus with diabetic chronic kidney disease: Secondary | ICD-10-CM | POA: Diagnosis not present

## 2016-02-14 DIAGNOSIS — N059 Unspecified nephritic syndrome with unspecified morphologic changes: Secondary | ICD-10-CM | POA: Diagnosis not present

## 2016-02-14 DIAGNOSIS — E1169 Type 2 diabetes mellitus with other specified complication: Secondary | ICD-10-CM | POA: Diagnosis present

## 2016-02-14 DIAGNOSIS — J810 Acute pulmonary edema: Secondary | ICD-10-CM | POA: Diagnosis not present

## 2016-02-14 DIAGNOSIS — Z87440 Personal history of urinary (tract) infections: Secondary | ICD-10-CM | POA: Diagnosis not present

## 2016-02-14 DIAGNOSIS — D649 Anemia, unspecified: Secondary | ICD-10-CM

## 2016-02-14 DIAGNOSIS — N390 Urinary tract infection, site not specified: Secondary | ICD-10-CM

## 2016-02-14 DIAGNOSIS — L89314 Pressure ulcer of right buttock, stage 4: Secondary | ICD-10-CM | POA: Diagnosis not present

## 2016-02-14 DIAGNOSIS — D631 Anemia in chronic kidney disease: Secondary | ICD-10-CM | POA: Diagnosis present

## 2016-02-14 DIAGNOSIS — N184 Chronic kidney disease, stage 4 (severe): Secondary | ICD-10-CM | POA: Diagnosis present

## 2016-02-14 DIAGNOSIS — J90 Pleural effusion, not elsewhere classified: Secondary | ICD-10-CM | POA: Diagnosis not present

## 2016-02-14 DIAGNOSIS — L89154 Pressure ulcer of sacral region, stage 4: Secondary | ICD-10-CM | POA: Diagnosis not present

## 2016-02-14 DIAGNOSIS — E43 Unspecified severe protein-calorie malnutrition: Secondary | ICD-10-CM | POA: Diagnosis not present

## 2016-02-14 DIAGNOSIS — I1310 Hypertensive heart and chronic kidney disease without heart failure, with stage 1 through stage 4 chronic kidney disease, or unspecified chronic kidney disease: Secondary | ICD-10-CM | POA: Diagnosis not present

## 2016-02-14 DIAGNOSIS — M869 Osteomyelitis, unspecified: Secondary | ICD-10-CM | POA: Diagnosis not present

## 2016-02-14 DIAGNOSIS — L89204 Pressure ulcer of unspecified hip, stage 4: Secondary | ICD-10-CM | POA: Diagnosis not present

## 2016-02-14 DIAGNOSIS — I420 Dilated cardiomyopathy: Secondary | ICD-10-CM | POA: Diagnosis not present

## 2016-02-14 DIAGNOSIS — R778 Other specified abnormalities of plasma proteins: Secondary | ICD-10-CM | POA: Diagnosis present

## 2016-02-14 DIAGNOSIS — Z993 Dependence on wheelchair: Secondary | ICD-10-CM | POA: Diagnosis not present

## 2016-02-14 DIAGNOSIS — N179 Acute kidney failure, unspecified: Secondary | ICD-10-CM | POA: Diagnosis not present

## 2016-02-14 DIAGNOSIS — M868X8 Other osteomyelitis, other site: Secondary | ICD-10-CM | POA: Diagnosis not present

## 2016-02-14 DIAGNOSIS — R0602 Shortness of breath: Secondary | ICD-10-CM | POA: Diagnosis not present

## 2016-02-14 DIAGNOSIS — Z792 Long term (current) use of antibiotics: Secondary | ICD-10-CM | POA: Diagnosis not present

## 2016-02-14 DIAGNOSIS — Z6820 Body mass index (BMI) 20.0-20.9, adult: Secondary | ICD-10-CM | POA: Diagnosis not present

## 2016-02-14 DIAGNOSIS — N319 Neuromuscular dysfunction of bladder, unspecified: Secondary | ICD-10-CM | POA: Diagnosis present

## 2016-02-14 DIAGNOSIS — L89310 Pressure ulcer of right buttock, unstageable: Secondary | ICD-10-CM | POA: Diagnosis not present

## 2016-02-14 DIAGNOSIS — I5023 Acute on chronic systolic (congestive) heart failure: Secondary | ICD-10-CM | POA: Diagnosis not present

## 2016-02-14 DIAGNOSIS — I132 Hypertensive heart and chronic kidney disease with heart failure and with stage 5 chronic kidney disease, or end stage renal disease: Secondary | ICD-10-CM | POA: Diagnosis not present

## 2016-02-14 DIAGNOSIS — R0902 Hypoxemia: Secondary | ICD-10-CM | POA: Diagnosis not present

## 2016-02-14 DIAGNOSIS — I428 Other cardiomyopathies: Secondary | ICD-10-CM | POA: Diagnosis present

## 2016-02-14 DIAGNOSIS — N170 Acute kidney failure with tubular necrosis: Secondary | ICD-10-CM | POA: Diagnosis not present

## 2016-02-14 DIAGNOSIS — M86151 Other acute osteomyelitis, right femur: Secondary | ICD-10-CM | POA: Diagnosis not present

## 2016-02-14 DIAGNOSIS — Z794 Long term (current) use of insulin: Secondary | ICD-10-CM | POA: Diagnosis not present

## 2016-02-14 DIAGNOSIS — G35 Multiple sclerosis: Secondary | ICD-10-CM | POA: Diagnosis present

## 2016-02-14 DIAGNOSIS — I10 Essential (primary) hypertension: Secondary | ICD-10-CM | POA: Diagnosis not present

## 2016-02-14 DIAGNOSIS — R7989 Other specified abnormal findings of blood chemistry: Secondary | ICD-10-CM

## 2016-02-14 DIAGNOSIS — N183 Chronic kidney disease, stage 3 (moderate): Secondary | ICD-10-CM | POA: Diagnosis not present

## 2016-02-14 DIAGNOSIS — I5041 Acute combined systolic (congestive) and diastolic (congestive) heart failure: Secondary | ICD-10-CM | POA: Diagnosis not present

## 2016-02-14 DIAGNOSIS — J9601 Acute respiratory failure with hypoxia: Secondary | ICD-10-CM | POA: Diagnosis not present

## 2016-02-14 DIAGNOSIS — I248 Other forms of acute ischemic heart disease: Secondary | ICD-10-CM | POA: Diagnosis not present

## 2016-02-14 DIAGNOSIS — F329 Major depressive disorder, single episode, unspecified: Secondary | ICD-10-CM | POA: Diagnosis present

## 2016-02-14 DIAGNOSIS — E1129 Type 2 diabetes mellitus with other diabetic kidney complication: Secondary | ICD-10-CM | POA: Diagnosis not present

## 2016-02-14 HISTORY — DX: Heart failure, unspecified: I50.9

## 2016-02-14 LAB — BASIC METABOLIC PANEL
Anion gap: 9 (ref 5–15)
BUN: 95 mg/dL — AB (ref 6–20)
CALCIUM: 8.6 mg/dL — AB (ref 8.9–10.3)
CHLORIDE: 108 mmol/L (ref 101–111)
CO2: 19 mmol/L — AB (ref 22–32)
CREATININE: 3.51 mg/dL — AB (ref 0.61–1.24)
GFR calc non Af Amer: 16 mL/min — ABNORMAL LOW (ref >60)
GFR, EST AFRICAN AMERICAN: 18 mL/min — AB (ref >60)
Glucose, Bld: 250 mg/dL — ABNORMAL HIGH (ref 65–99)
Potassium: 4.8 mmol/L (ref 3.5–5.1)
SODIUM: 136 mmol/L (ref 135–145)

## 2016-02-14 LAB — CBC WITH DIFFERENTIAL/PLATELET
BASOS ABS: 0.1 10*3/uL (ref 0.0–0.1)
BASOS PCT: 0 %
EOS PCT: 1 %
Eosinophils Absolute: 0.1 10*3/uL (ref 0.0–0.7)
HEMATOCRIT: 28.2 % — AB (ref 39.0–52.0)
Hemoglobin: 9.7 g/dL — ABNORMAL LOW (ref 13.0–17.0)
LYMPHS PCT: 13 %
Lymphs Abs: 1.9 10*3/uL (ref 0.7–4.0)
MCH: 29.9 pg (ref 26.0–34.0)
MCHC: 34.4 g/dL (ref 30.0–36.0)
MCV: 87 fL (ref 78.0–100.0)
MONO ABS: 1.1 10*3/uL — AB (ref 0.1–1.0)
MONOS PCT: 8 %
Neutro Abs: 11.1 10*3/uL — ABNORMAL HIGH (ref 1.7–7.7)
Neutrophils Relative %: 78 %
PLATELETS: 224 10*3/uL (ref 150–400)
RBC: 3.24 MIL/uL — ABNORMAL LOW (ref 4.22–5.81)
RDW: 16.3 % — AB (ref 11.5–15.5)
WBC: 14.2 10*3/uL — ABNORMAL HIGH (ref 4.0–10.5)

## 2016-02-14 LAB — CBC
HCT: 29.5 % — ABNORMAL LOW (ref 39.0–52.0)
HEMOGLOBIN: 9.8 g/dL — AB (ref 13.0–17.0)
MCH: 30 pg (ref 26.0–34.0)
MCHC: 33.2 g/dL (ref 30.0–36.0)
MCV: 90.2 fL (ref 78.0–100.0)
Platelets: 221 10*3/uL (ref 150–400)
RBC: 3.27 MIL/uL — ABNORMAL LOW (ref 4.22–5.81)
RDW: 16.8 % — AB (ref 11.5–15.5)
WBC: 11 10*3/uL — ABNORMAL HIGH (ref 4.0–10.5)

## 2016-02-14 LAB — URINE MICROSCOPIC-ADD ON

## 2016-02-14 LAB — COMPREHENSIVE METABOLIC PANEL
ALT: 15 U/L — AB (ref 17–63)
AST: 18 U/L (ref 15–41)
Albumin: 2.5 g/dL — ABNORMAL LOW (ref 3.5–5.0)
Alkaline Phosphatase: 68 U/L (ref 38–126)
Anion gap: 8 (ref 5–15)
BILIRUBIN TOTAL: 0.8 mg/dL (ref 0.3–1.2)
BUN: 90 mg/dL — AB (ref 6–20)
CALCIUM: 8.4 mg/dL — AB (ref 8.9–10.3)
CHLORIDE: 112 mmol/L — AB (ref 101–111)
CO2: 17 mmol/L — ABNORMAL LOW (ref 22–32)
CREATININE: 3.3 mg/dL — AB (ref 0.61–1.24)
GFR, EST AFRICAN AMERICAN: 20 mL/min — AB (ref >60)
GFR, EST NON AFRICAN AMERICAN: 17 mL/min — AB (ref >60)
Glucose, Bld: 143 mg/dL — ABNORMAL HIGH (ref 65–99)
Potassium: 4.5 mmol/L (ref 3.5–5.1)
Sodium: 137 mmol/L (ref 135–145)
TOTAL PROTEIN: 7 g/dL (ref 6.5–8.1)

## 2016-02-14 LAB — URINALYSIS, ROUTINE W REFLEX MICROSCOPIC
BILIRUBIN URINE: NEGATIVE
GLUCOSE, UA: NEGATIVE mg/dL
KETONES UR: NEGATIVE mg/dL
Nitrite: POSITIVE — AB
Specific Gravity, Urine: 1.02 (ref 1.005–1.030)
pH: 5.5 (ref 5.0–8.0)

## 2016-02-14 LAB — CREATININE, URINE, RANDOM: Creatinine, Urine: 33.68 mg/dL

## 2016-02-14 LAB — GLUCOSE, CAPILLARY
Glucose-Capillary: 151 mg/dL — ABNORMAL HIGH (ref 65–99)
Glucose-Capillary: 237 mg/dL — ABNORMAL HIGH (ref 65–99)
Glucose-Capillary: 169 mg/dL — ABNORMAL HIGH (ref 65–99)
Glucose-Capillary: 173 mg/dL — ABNORMAL HIGH (ref 65–99)

## 2016-02-14 LAB — PROTEIN / CREATININE RATIO, URINE
CREATININE, URINE: 33.74 mg/dL
Protein Creatinine Ratio: 5.36 mg/mg{Cre} — ABNORMAL HIGH (ref 0.00–0.15)
TOTAL PROTEIN, URINE: 181 mg/dL

## 2016-02-14 LAB — ECHOCARDIOGRAM COMPLETE
Height: 76 in
Weight: 2941.82 oz

## 2016-02-14 LAB — I-STAT TROPONIN, ED: Troponin i, poc: 0.16 ng/mL (ref 0.00–0.08)

## 2016-02-14 LAB — TROPONIN I
TROPONIN I: 0.15 ng/mL — AB (ref <0.03)
Troponin I: 0.19 ng/mL (ref <0.03)

## 2016-02-14 LAB — LACTIC ACID, PLASMA: Lactic Acid, Venous: 0.7 mmol/L (ref 0.5–1.9)

## 2016-02-14 LAB — SODIUM, URINE, RANDOM: Sodium, Ur: 105 mmol/L

## 2016-02-14 LAB — SEDIMENTATION RATE: SED RATE: 119 mm/h — AB (ref 0–16)

## 2016-02-14 LAB — C-REACTIVE PROTEIN: CRP: 3.4 mg/dL — AB (ref <1.0)

## 2016-02-14 LAB — BRAIN NATRIURETIC PEPTIDE: B Natriuretic Peptide: 927 pg/mL — ABNORMAL HIGH (ref 0.0–100.0)

## 2016-02-14 MED ORDER — INSULIN DETEMIR 100 UNIT/ML ~~LOC~~ SOLN
10.0000 [IU] | Freq: Every day | SUBCUTANEOUS | Status: DC
  Administered 2016-02-14 – 2016-02-26 (×12): 10 [IU] via SUBCUTANEOUS
  Filled 2016-02-14 (×15): qty 0.1

## 2016-02-14 MED ORDER — SIMVASTATIN 20 MG PO TABS
20.0000 mg | ORAL_TABLET | Freq: Every evening | ORAL | Status: DC
  Administered 2016-02-14: 20 mg via ORAL
  Filled 2016-02-14: qty 1

## 2016-02-14 MED ORDER — COLLAGENASE 250 UNIT/GM EX OINT: TOPICAL_OINTMENT | Freq: Every day | CUTANEOUS | Status: DC

## 2016-02-14 MED ORDER — ACETAMINOPHEN 650 MG RE SUPP: 650.0000 mg | Freq: Four times a day (QID) | RECTAL | Status: DC | PRN

## 2016-02-14 MED ORDER — CHLORHEXIDINE GLUCONATE 0.12 % MT SOLN
15.0000 mL | Freq: Two times a day (BID) | OROMUCOSAL | Status: DC
  Administered 2016-02-14 – 2016-02-21 (×14): 15 mL via OROMUCOSAL
  Filled 2016-02-14 (×10): qty 15

## 2016-02-14 MED ORDER — ADULT MULTIVITAMIN W/MINERALS CH
1.0000 | ORAL_TABLET | Freq: Every day | ORAL | Status: DC
  Administered 2016-02-14 – 2016-02-26 (×9): 1 via ORAL
  Filled 2016-02-14 (×12): qty 1

## 2016-02-14 MED ORDER — INSULIN ASPART 100 UNIT/ML ~~LOC~~ SOLN
0.0000 [IU] | Freq: Every day | SUBCUTANEOUS | Status: DC
  Administered 2016-02-20: 3 [IU] via SUBCUTANEOUS
  Administered 2016-02-21: 2 [IU] via SUBCUTANEOUS
  Administered 2016-02-23: 0 [IU] via SUBCUTANEOUS

## 2016-02-14 MED ORDER — ENOXAPARIN SODIUM 30 MG/0.3ML ~~LOC~~ SOLN
30.0000 mg | SUBCUTANEOUS | Status: DC
  Administered 2016-02-14 – 2016-02-17 (×4): 30 mg via SUBCUTANEOUS
  Filled 2016-02-14 (×4): qty 0.3

## 2016-02-14 MED ORDER — ONDANSETRON HCL 4 MG PO TABS: 4.0000 mg | ORAL_TABLET | Freq: Four times a day (QID) | ORAL | Status: DC | PRN

## 2016-02-14 MED ORDER — PERFLUTREN LIPID MICROSPHERE
1.0000 mL | INTRAVENOUS | Status: AC | PRN
  Administered 2016-02-14: 2 mL via INTRAVENOUS
  Filled 2016-02-14: qty 10

## 2016-02-14 MED ORDER — FUROSEMIDE 10 MG/ML IJ SOLN
40.0000 mg | Freq: Once | INTRAMUSCULAR | Status: AC
  Administered 2016-02-14: 40 mg via INTRAVENOUS

## 2016-02-14 MED ORDER — COLLAGENASE 250 UNIT/GM EX OINT
TOPICAL_OINTMENT | Freq: Every day | CUTANEOUS | Status: DC
  Administered 2016-02-15 – 2016-02-17 (×3): via TOPICAL
  Administered 2016-02-18: 1 via TOPICAL
  Administered 2016-02-19 – 2016-02-22 (×4): via TOPICAL
  Administered 2016-02-23: 1 via TOPICAL
  Administered 2016-02-24 – 2016-02-26 (×3): via TOPICAL
  Filled 2016-02-14: qty 30

## 2016-02-14 MED ORDER — HYDROCODONE-ACETAMINOPHEN 5-325 MG PO TABS: 1.0000 | ORAL_TABLET | ORAL | Status: DC | PRN

## 2016-02-14 MED ORDER — PREMIER PROTEIN SHAKE
11.0000 [oz_av] | Freq: Two times a day (BID) | ORAL | Status: DC
  Administered 2016-02-14 – 2016-02-17 (×4): 11 [oz_av] via ORAL
  Filled 2016-02-14 (×3): qty 325.31

## 2016-02-14 MED ORDER — ONDANSETRON HCL 4 MG/2ML IJ SOLN
4.0000 mg | Freq: Four times a day (QID) | INTRAMUSCULAR | Status: DC | PRN
  Administered 2016-02-15 – 2016-02-17 (×3): 4 mg via INTRAVENOUS
  Filled 2016-02-14 (×3): qty 2

## 2016-02-14 MED ORDER — OXYBUTYNIN CHLORIDE 5 MG PO TABS
5.0000 mg | ORAL_TABLET | Freq: Two times a day (BID) | ORAL | Status: DC
Start: 2016-02-14 — End: 2016-02-27
  Administered 2016-02-14 – 2016-02-26 (×19): 5 mg via ORAL
  Filled 2016-02-14 (×22): qty 1

## 2016-02-14 MED ORDER — VANCOMYCIN HCL IN DEXTROSE 1-5 GM/200ML-% IV SOLN
1000.0000 mg | Freq: Once | INTRAVENOUS | Status: DC
  Filled 2016-02-14: qty 200

## 2016-02-14 MED ORDER — FUROSEMIDE 10 MG/ML IJ SOLN
60.0000 mg | Freq: Two times a day (BID) | INTRAMUSCULAR | Status: DC
  Administered 2016-02-14: 60 mg via INTRAVENOUS
  Filled 2016-02-14: qty 6

## 2016-02-14 MED ORDER — INSULIN ASPART 100 UNIT/ML ~~LOC~~ SOLN
0.0000 [IU] | Freq: Three times a day (TID) | SUBCUTANEOUS | Status: DC
  Administered 2016-02-14 (×2): 2 [IU] via SUBCUTANEOUS
  Administered 2016-02-14: 3 [IU] via SUBCUTANEOUS
  Administered 2016-02-15 (×3): 2 [IU] via SUBCUTANEOUS
  Administered 2016-02-16: 3 [IU] via SUBCUTANEOUS
  Administered 2016-02-16: 1 [IU] via SUBCUTANEOUS
  Administered 2016-02-16: 2 [IU] via SUBCUTANEOUS
  Administered 2016-02-17: 1 [IU] via SUBCUTANEOUS
  Administered 2016-02-17 (×2): 3 [IU] via SUBCUTANEOUS
  Administered 2016-02-19: 1 [IU] via SUBCUTANEOUS
  Administered 2016-02-19: 2 [IU] via SUBCUTANEOUS
  Administered 2016-02-20 (×2): 1 [IU] via SUBCUTANEOUS
  Administered 2016-02-20: 3 [IU] via SUBCUTANEOUS
  Administered 2016-02-21: 2 [IU] via SUBCUTANEOUS
  Administered 2016-02-21: 5 [IU] via SUBCUTANEOUS
  Administered 2016-02-21 – 2016-02-22 (×2): 1 [IU] via SUBCUTANEOUS
  Administered 2016-02-23: 3 [IU] via SUBCUTANEOUS
  Administered 2016-02-24: 1 [IU] via SUBCUTANEOUS
  Administered 2016-02-24 – 2016-02-26 (×3): 2 [IU] via SUBCUTANEOUS

## 2016-02-14 MED ORDER — FUROSEMIDE 10 MG/ML IJ SOLN
40.0000 mg | Freq: Two times a day (BID) | INTRAMUSCULAR | Status: DC
  Administered 2016-02-14: 40 mg via INTRAVENOUS
  Filled 2016-02-14 (×2): qty 4

## 2016-02-14 MED ORDER — INSULIN DETEMIR 100 UNIT/ML ~~LOC~~ SOLN: 18.0000 [IU] | Freq: Every day | SUBCUTANEOUS | Status: DC

## 2016-02-14 MED ORDER — DEXTROSE 5 % IV SOLN
1.0000 g | Freq: Once | INTRAVENOUS | Status: AC
  Administered 2016-02-14: 1 g via INTRAVENOUS
  Filled 2016-02-14: qty 10

## 2016-02-14 MED ORDER — BLOOD GLUCOSE METER KIT: PACK | 0 refills | Status: DC

## 2016-02-14 MED ORDER — ENSURE ENLIVE PO LIQD
237.0000 mL | Freq: Two times a day (BID) | ORAL | Status: DC
  Administered 2016-02-14: 237 mL via ORAL

## 2016-02-14 MED ORDER — ASPIRIN EC 81 MG PO TBEC
81.0000 mg | DELAYED_RELEASE_TABLET | Freq: Every day | ORAL | Status: DC
  Administered 2016-02-14 – 2016-02-17 (×4): 81 mg via ORAL
  Filled 2016-02-14 (×6): qty 1

## 2016-02-14 MED ORDER — BISACODYL 10 MG RE SUPP: 10.0000 mg | Freq: Every day | RECTAL | Status: DC | PRN

## 2016-02-14 MED ORDER — ORAL CARE MOUTH RINSE
15.0000 mL | Freq: Two times a day (BID) | OROMUCOSAL | Status: DC
  Administered 2016-02-14 – 2016-02-26 (×16): 15 mL via OROMUCOSAL

## 2016-02-14 MED ORDER — PERFLUTREN LIPID MICROSPHERE
INTRAVENOUS | Status: AC
  Filled 2016-02-14: qty 10

## 2016-02-14 MED ORDER — ACETAMINOPHEN 325 MG PO TABS
650.0000 mg | ORAL_TABLET | Freq: Four times a day (QID) | ORAL | Status: DC | PRN
  Administered 2016-02-17: 650 mg via ORAL
  Filled 2016-02-14: qty 2

## 2016-02-14 MED ORDER — POTASSIUM CHLORIDE CRYS ER 20 MEQ PO TBCR
40.0000 meq | EXTENDED_RELEASE_TABLET | Freq: Two times a day (BID) | ORAL | Status: DC
  Administered 2016-02-14 (×2): 40 meq via ORAL
  Filled 2016-02-14 (×2): qty 2

## 2016-02-14 NOTE — Progress Notes (Signed)
Initial Nutrition Assessment  DOCUMENTATION CODES:   Not applicable  INTERVENTION:  Discontinued Ensure Enlive.  Ordered Premier Protein po BID, each supplement has 160 kcal and 30 grams protein.  Will order evening snack for patient at follow-up.  Will order daily multivitamin with minerals.  NUTRITION DIAGNOSIS:   Increased nutrient needs related to wound healing as evidenced by estimated needs.  GOAL:   Patient will meet greater than or equal to 90% of their needs  MONITOR:   PO intake, Supplement acceptance, Labs, Weight trends, I & O's, Skin  REASON FOR ASSESSMENT:   Malnutrition Screening Tool    ASSESSMENT:   75 y.o. male with a past medical history significant for MS with quadruplegia, chronic decubitus ulcer, anemia, CKDIII baseline Cr 1.7-1.8, HTN, IDDM and recurrent UTI with indwelling foley catheter who presents with dyspnea.  Patient is known to this RD from previous admission.   Patient was gone for MRI, but able to speak with family in room. UBW was 200 lbs, and patient had lost 16.1 kg (17% body weight) over 1 year (not significant for time) and had since been gaining it back. CBW is 183 lbs. He has 3 meals daily with a snack in the evening and drinks Premier Protein BID.  Medications reviewed and include: Lasix 60 mg BID, Novolog sliding scale TID with meals and daily at bedtime, Levemir 10 units daily, potassium chloride 40 mEq BID.  Labs reviewed: CBG 237, CO2 19, Bun 95, Creatinine 3.51.  Unable to complete Nutrition-Focused Physical Exam. Will complete at F/U.  Discussed plan with RN.   Diet Order:  Diet heart healthy/carb modified Room service appropriate? Yes; Fluid consistency: Thin  Skin:  Wound (see comment) (Unstageable to buttocks)  Last BM:  02/14/2016  Height:   Ht Readings from Last 1 Encounters:  02/14/16 6\' 4"  (1.93 m)    Weight:   Wt Readings from Last 1 Encounters:  02/14/16 183 lb 13.8 oz (83.4 kg)    Ideal Body  Weight:  91.82 kg  BMI:  Body mass index is 22.38 kg/m.  Estimated Nutritional Needs:   Kcal:  8144 (27.9 kcal/kg per EAL for paraplegia)  Protein:  83-125 grams (1-1.5 g/kg per EAL for paraplegia with chronic wound)  Fluid:  2.3 L/day  EDUCATION NEEDS:   Education needs no appropriate at this time  Willey Blade, MS, RD, LDN Pager: [phone] After Hours Pager: [phone]

## 2016-02-14 NOTE — ED Notes (Signed)
Patient transported to X-ray 

## 2016-02-14 NOTE — H&P (Signed)
History and Physical  Patient Name: Lance [name]     VXB:[id number]    [name]: 11-Dec-1940    DOA: 02/13/2016 PCP: [name], MD   Patient coming from: [facility]  Chief Complaint: Dyspnea/orthopnea  HPI: Lance [name] is a 75 y.o. male with a past medical history significant for MS with quadruplegia, chronic decubitus ulcer, anemia, CKDIII baseline Cr 1.7-1.8, HTN, IDDM and recurrent UTI with indwelling foley catheter who presents with dyspnea.  The patient presents tonight because of new onset dyspnea.  In the last few days, he reports decreased appetite, dry heaves, no appetite and increased fatigue (although he is wheelchair bound).  His breathing has been normal other than a mild dry cough this week, until tonight at 9:30PM he went to bed and as soon as he lay down he felt short of breath.  He was restless and eventually fell asleep, but woke up shortly after again short of breath so he called staff who found him hypoxic to the high 80s and called EMS.  There was no chest pain, chest pressure, leg redness/swelling, hemoptysis.    ED course: -Afebrile, heart rate 99, respirations 17-24, BP 184/90, pulse oximetry 92% on room air -Na 137, K 4.5, Cr 3.3 (baseline 1.7-1.8), WBC 14.2K (up from 8K at discharge), Hgb 9.7 stable, lactate 0.7 -CXR showed bilateral edema, BNP was >900 pg/mL, troponin 0.15 ng/mL and stable on repeat -ECG showed nonspecific T wave changes new in lateral leads -His urine was obtained and had chronic pyuria -He was given ceftriaxone and TRH were asked to evaluate for possible acute CHF, renal failure and possible UTI    Patient was hospitalized 9/28-10/4/17, sent to to the emergency room by wound care specialist for evaluation of right ischial pressure ulcer with secondary bacterial infection and possible osteomyelitis. Osteomyelitis was not suggested on plain radiograph (sed rate and CRP both high). Bedside debridement was completed 10/2.  No evidence of active deeper infection was found & he was placed on empiric vancomycin and Zosyn for a total of 6 days for wound infection and possible UTI. Urine culture revealed over 100,000 colonies of viridans streptococcus & Klebsiella pneumoniae. The wound culture 9/28 revealed few methicillin-resistant staph aureus as well as few enterococcus faecalis.  Discharged to continue Bactrim DS twice a day for 4 additional days which he completed three days ago. At the time of discharge creatinine was 1.82 and BUN 44.The white count had normalized.   Since then, he has been recuperating at [facility] (prior to that he was living with his wife and son).  He is being evaluated for excision of his sacral ulcer and flap reconstruction by plastic surgery at [facility]Per patient report, his wound nurse thought his wound has been looking better since discharge.  He has had no new pain at his foley, hematuria, or change in urine character that he can tell.   Patient did have osteomyelitis of the pelvis >1 year ago in the setting of TEE- MRSA bacteremia for which he had 7 weeks of IV vancomycin with ID, Dr. Johnnye Sima.       ROS: Review of Systems  Constitutional: Negative for chills and fever.  Respiratory: Positive for cough (dry) and shortness of breath. Negative for hemoptysis, sputum production and wheezing.   Cardiovascular: Positive for orthopnea and PND. Negative for chest pain and leg swelling.  Genitourinary: Negative for dysuria, flank pain and hematuria.  All other systems reviewed and are negative.  Past Medical History:  Diagnosis Date  . Anemia in chronic renal disease   . Blood transfusion   . Chronic spastic paraplegia (HCC)    secondary to MS  . CKD (chronic kidney disease), stage III   . Decreased sensation of lower extremity    due to MS  . Decubitus ulcer of ischium   . History of MRSA infection    urine  . History of recurrent UTIs   . Hypertension   . MS (multiple  sclerosis) ([place])   . Neuralgia neuritis, sciatic nerve    MS for 30 years  . Neurogenic bladder   . PONV (postoperative nausea and vomiting)   . Self-catheterizes urinary bladder   . Type 2 diabetes mellitus ([place])     Past Surgical History:  Procedure Laterality Date  . APPLICATION OF A-CELL OF EXTREMITY Left 09/05/2014   Procedure: PLACEMENT OF ACELL AND VAC;  Surgeon: Theodoro Kos, DO;  Location: [place];  Service: Plastics;  Laterality: Left;  . EXTRACORPOREAL SHOCK WAVE LITHOTRIPSY  03-23-2011  . FINGER SURGERY  2004  . INCISION AND DRAINAGE OF WOUND Left 09/05/2014   Procedure: IRRIGATION AND DEBRIDEMENT OF LEFT ISCHIUM WOUND WITH ;  Surgeon: Theodoro Kos, DO;  Location: [place];  Service: Plastics;  Laterality: Left;  . TONSILLECTOMY    . WOUND DEBRIDEMENT     [facility]/2/17[facility]     Social History: Patient lived with his wife and son until recently, lately has been in [facility].  The patient is quadruplegic.  He is a remote former smoker.    No Known Allergies  Family history: family history includes Diabetes in his father and paternal grandmother; Stroke in his father and paternal grandfather.  Prior to Admission medications   Medication Sig Start Date End Date Taking? Authorizing Provider  Ascorbic Acid (VITAMIN C PO) Take 1 tablet by mouth daily.    Yes Historical Provider, MD  aspirin 81 MG tablet Take 1 tablet (81 mg total) by mouth daily. 03/30/11  Yes Rolan Bucco, MD  bisacodyl (DULCOLAX) 10 MG suppository Place 1 suppository (10 mg total) rectally daily as needed for moderate constipation. 08/06/13  Yes Josetta Huddle, MD  Cholecalciferol (VITAMIN D3) 2000 UNITS TABS Take 2,000 mg by mouth daily.     Yes Historical Provider, MD  fish oil-omega-3 fatty acids 1000 MG capsule Take 1 capsule (1 g total) by mouth daily. 03/30/11  Yes Rolan Bucco, MD  HYDROcodone-acetaminophen (NORCO/VICODIN) 5-325 MG tablet Take 1-2 tablets by mouth every 4 (four) hours as needed  for moderate pain. 02/05/16  Yes Mir Marry Guan, MD  insulin aspart (NOVOLOG FLEXPEN) 100 UNIT/ML FlexPen Inject 8 Units into the skin 3 (three) times daily with meals. 09/07/14  Yes Domenic Polite, MD  LEVEMIR FLEXTOUCH 100 UNIT/ML Pen Inject 18 Units into the skin daily.  01/28/16  Yes Historical Provider, MD  Multiple Vitamin (MULTIVITAMIN WITH MINERALS) TABS tablet Take 1 tablet by mouth daily.   Yes Historical Provider, MD  oxybutynin (DITROPAN) 5 MG tablet Take 5 mg by mouth 2 (two) times daily.    Yes Historical Provider, MD  simvastatin (ZOCOR) 20 MG tablet Take 20 mg by mouth every evening.   Yes Historical Provider, MD  collagenase (SANTYL) ointment Apply topically daily. 02/06/16   Mir Marry Guan, MD  protein supplement shake (PREMIER PROTEIN) LIQD Take 325 mLs (11 oz total) by mouth 2 (two) times daily between meals. Patient not taking: Reported on 02/14/2016 02/05/16 03/06/16  Mir  Marry Guan, MD       Physical Exam: BP 172/91   Pulse 95   Temp 98.2 F (36.8 C) (Oral)   Resp 24   SpO2 94%  General appearance: Frail debilitated elderly adult male, alert and in no acute distress.   Eyes: Anicteric, conjunctiva pink, lids and lashes normal. PERRL.    ENT: No nasal deformity, discharge, epistaxis.  Hearing normal. OP moist without lesions.   Neck: No neck masses.  Trachea midline.  No thyromegaly/tenderness. Lymph: No cervical or supraclavicular lymphadenopathy. Skin: Warm and dry.  No jaundice.  No suspicious rashes or lesions.  No redness around right ischial ulcer, no foul smell. Cardiac: RRR, nl S1-S2, no murmurs appreciated.  Capillary refill is brisk.  JVP not well appreciated at 30deg, but HJR+.  No LE edema.  Radial and DP pulses 2+ and symmetric. Respiratory: Normal respiratory rate and rhythm.  Bibasilar rales, no wheezes. Abdomen: Abdomen soft.  No TTP. No ascites, distension, hepatosplenomegaly.   MSK: No deformities or effusions.  No cyanosis or  clubbing. Neuro: Cranial nerves grossly normal.  Sensation intact to light touch. Speech is fluent.  Muscle strength globally and symmetrically weak, mild bilateral contractures.    Psych: Sensorium intact and responding to questions, attention normal.  Behavior appropriate.  Affect normal.  Judgment and insight appear normal.     Labs on Admission:  I have personally reviewed following labs and imaging studies: CBC:  Recent Labs Lab 02/14/16 0020  WBC 14.2*  NEUTROABS 11.1*  HGB 9.7*  HCT 28.2*  MCV 87.0  PLT 388   Basic Metabolic Panel:  Recent Labs Lab 02/14/16 0020  NA 137  K 4.5  CL 112*  CO2 17*  GLUCOSE 143*  BUN 90*  CREATININE 3.30*  CALCIUM 8.4*   GFR: Estimated Creatinine Clearance: 23.6 mL/min (by C-G formula based on SCr of 3.3 mg/dL (H)).  Liver Function Tests:  Recent Labs Lab 02/14/16 0020  AST 18  ALT 15*  ALKPHOS 68  BILITOT 0.8  PROT 7.0  ALBUMIN 2.5*   No results for input(s): LIPASE, AMYLASE in the last 168 hours. No results for input(s): AMMONIA in the last 168 hours. Coagulation Profile: No results for input(s): INR, PROTIME in the last 168 hours. Cardiac Enzymes:  Recent Labs Lab 02/14/16 0020  TROPONINI 0.15*   BNP (last 3 results) No results for input(s): PROBNP in the last 8760 hours. HbA1C: No results for input(s): HGBA1C in the last 72 hours. CBG: No results for input(s): GLUCAP in the last 168 hours. Lipid Profile: No results for input(s): CHOL, HDL, LDLCALC, TRIG, CHOLHDL, LDLDIRECT in the last 72 hours. Thyroid Function Tests: No results for input(s): TSH, T4TOTAL, FREET4, T3FREE, THYROIDAB in the last 72 hours. Anemia Panel: No results for input(s): VITAMINB12, FOLATE, FERRITIN, TIBC, IRON, RETICCTPCT in the last 72 hours. Sepsis Labs: Lactic acid 0.7 Invalid input(s): PROCALCITONIN, LACTICIDVEN No results found for this or any previous visit (from the past 240 hour(s)).       Radiological Exams on  Admission: Personally reviewed and shows bilateral edema: Dg Chest 2 View  Result Date: 02/14/2016 CLINICAL DATA:  Acute onset of wheezing and shortness of breath. Initial encounter. EXAM: CHEST  2 VIEW COMPARISON:  Chest radiograph performed 08/30/2014 FINDINGS: Vascular congestion is noted, with bilateral central airspace opacification and small bilateral pleural effusions, compatible with pulmonary edema. No pneumothorax is seen. The cardiomediastinal silhouette is borderline normal in size. No acute osseous abnormalities are identified. IMPRESSION: Vascular  congestion, with bilateral central airspace opacification and small bilateral pleural effusions, compatible with pulmonary edema. Electronically Signed   By: Garald Balding M.D.   On: 02/14/2016 01:35    EKG: Independently reviewed. Rate 100, QTc 471, nonspecific T wave changes in lateral leads, new from previous.  Echocardiogram 2016: EF 50-55% Mild to moderate MR       Assessment/Plan  1. Cardiorenal syndrome on CKD III:  Baseline Cr 1.7.  Baseline EF 50%, no previous CHF flare.  Unclear precipitating factor at present (no recent NSAIDs, no known hypotension at facility, no recent contrast exposure).  No casts on UA, mild pyuria, recent antibiotics with Bactrim but suspect most likely congestive nephropathy/cardiorenal syndrome. -Check FeNA -Check renal US -Check P/C ratio -Consult to Nephrology in the morning  -Furosemide 40 mg BID initially -Follow BMP closely in 12 hours then daily -Strict I/Os, daily weights -Supplement K while on furosemide -Check echocardiogram -Repeat troponin     2. MS with spastic quadruplegia:   3. Normocytic anemia:  Stable  4. Leukocytosis:  Unclear cause.  UA with fewer WBC/RBC than before, just completed appropriate treatment, no new symptoms.  Ulcer not festering, but noted that CRP/ESR elevated during last hospitalization (and he had osteomyelitis of the pelvis >1 year ago in the  setting of TEE- MRSA bacteremia for which he had 7 weeks of IV vancomycin with ID, Dr. Johnnye Sima) -Hold antibiotics for now, clinically stable -Check ESR, CRP -Low threshold for MRI pelvis and ID consult if elevated  5. IDDM:  -Continue Levemir, lower dose given renal failure -SSI with meals  6. HTN:  Currently hypertensive in the setting of fluid overload. -Continue aspirin and statin  7. Recurrent UTI with indwelling foley:  Foley last changed before last course of antibiotics. -Continue oxybutynin  8. Severe protein calorie malnutrition: Defer to outpatient nutrition team at [place]. Sacral decubitus ulcer: No evidence currently of cellulitis.   -ESR and CRP and imaging as above -Consult WOC          DVT prophylaxis: Lovenox  Code Status: FULL  Family Communication: None present  Disposition Plan: Anticipate diuresis and close monitoring of renal function Consults called: None overnight Admission status: INPATIENT, telemetry       Medical decision making: Patient seen at 2:15 AM on 02/14/2016.  The patient was discussed with Dr. Randal Buba.  What exists of the patient's chart was reviewed in depth and summarized above.  Clinical condition: requiring close monitoring of electrolytes and renal function, telemetry and work up of leukocytosis.        Edwin Dada [facility] Pager [phone]      At the time of admission, it appears that the appropriate admission status for this patient is INPATIENT. This is judged to be reasonable and necessary in order to provide the required intensity of service to ensure the patient's safety given the presenting symptoms, physical exam findings, and initial radiographic and laboratory data in the context of their chronic comorbidities.  Together, these circumstances are felt to place her/him at high risk for further clinical deterioration threatening life, limb, or organ. The following factors support the  admission status of inpatient:   A. The patient's presenting symptoms include shortness of breath B. The worrisome physical exam findings include tachcyardai, hypoxia C. The initial radiographic and laboratory data are worrisome because of doubling of creatinine (acute renal failure), elevated troponin, leukocytosis, anemia, mild acidosis, elevated BNP, edema on CXR and new ECG changes D. The chronic  co-morbidities include quadruplegia from [place], indwelling foley, anemia, HTN, chronic kidney disease, chronic decubitus sacral ulcer, and insulin dependent diabetes E. Patient requires inpatient status due to high intensity of service, high risk for further deterioration and high frequency of surveillance required because of this severe exacerbation of their chronic organ failure and acute illness that poses a threat to life or bodily function. F. I certify that at the point of admission it is my clinical judgment that the patient will require inpatient hospital care spanning beyond 2 midnights from the point of admission and that early discharge would result in unnecessary risk of decompensation and readmission or threat to life, limb or bodily function.

## 2016-02-14 NOTE — Consult Note (Signed)
[facility] Consultation Note Requesting Physician:  Dr. Irwin Brakeman Reason for Consult:  Acute kidney injury  HPI: The patient is a 75 y.o. year-old with PMH significant for DM2, HTN, multiple sclerosis, spastic quadriplegia, urinary retention, h/o decubitus ulcers/ischial osteo Left (2016 with MRSA bacteremia treated with 7 weeks of vancomycin).   He was recently hospitalized (9/29-10/4) because of a wound associated with a R ischial pressure ulcer, that was debrided at bedside, treated with 6 days of vanco and zosyn ( for this plus a klebsiella and strep UTI) He was discharged on 10/4 (with a creatinine of 1.82.) to finish 4 more days of bactrim.    He was brought back to the hospital from his [facility] (there for rehab)  with new onset dyspnea, and hypoxemia. CXR in the ED showed pulmonary edema, creatinine was noted to be elevated at 3.3 and he was admitted. BNP elevated >900, troponins elevated.   ECHO shows LV EF 35% - 40% (down from 50-55% 09/2014), moderate diffuse hypokinesis, mild to moderate MR and Grade 1 diastolic dysfunction.   Was not on ACE, ARB, NSAIDS. No hypotension. He has received 40 mg of lasix IV X 2. UOP for the day is 450 cc.   He tells me that ever since he discharge from the hospital on 10/4 he has had nausea and no appetite.  He was having NO issues with his breathing until last night when he laid down and felt like he was smothering and that something was sitting on his chest. He denies any prior similar episodes until last PM. He has not had any exertional SOB doing arm weights. He does not stand or walk, sits in a wheelchair mostly - has not noted any leg swelling.  He had AKI on CKD in 2012 associated with ABLA and a spontaneous retroperitoneal and perinephric hematoma, wiith a peak creatinine in the 4's.    Date/Time Value  02/14/2016 11:17 AM 3.51 (H)  02/14/2016 12:20 AM 3.30 (H)  02/05/2016 08:36 AM 1.82 (H)  02/02/2016 05:08 AM 1.75 (H)  01/31/2016 05:33 AM  1.69 (H)  01/30/2016 01:05 PM 1.70 (H)  08/12/2015 1.4 (A)  08/02/2015 1.5 (A)  07/09/2015 1.5 (A)  09/07/2014 05:50 AM 1.23  09/06/2014 04:40 AM 1.24  09/05/2014 05:40 AM 1.31 (H)  09/04/2014 05:42 AM 1.33 (H)  09/03/2014 06:30 AM 1.47 (H)  09/02/2014 08:15 AM 1.48 (H)  09/01/2014 12:33 AM 1.85 (H)  08/31/2014 05:09 AM 1.99 (H)  08/30/2014 10:22 PM 1.93 (H)  08/06/2013 06:08 AM 1.41 (H)  08/05/2013 04:09 AM 1.43 (H)  08/04/2013 03:55 AM 1.74 (H)  08/03/2013 05:01 AM 1.98 (H)  08/02/2013 10:40 AM 1.78 (H)  04/21/2011 06:50 AM 1.44 (H)  04/13/2011 06:00 AM 2.49 (H)  04/08/2011 06:58 AM 2.41 (H)  04/07/2011 06:44 AM 2.14 (H)  04/04/2011 03:18 AM 1.95 (H)  04/03/2011 03:30 AM 1.97 (H)  04/02/2011 03:24 AM 2.49 (H)  04/01/2011 03:35 AM 3.16 (H)  03/31/2011 10:15 AM 3.42 (H)  03/30/2011 03:08 PM 4.53 (H)    Past Medical History:  Diagnosis Date  . Anemia in chronic renal disease   . Blood transfusion   . Chronic spastic paraplegia (HCC)    secondary to MS  . CKD (chronic kidney disease), stage III   . Decreased sensation of lower extremity    due to MS  . Decubitus ulcer of ischium   . History of MRSA infection    urine  . History of recurrent UTIs   .  Hypertension   . MS (multiple sclerosis) ([place])   . Neuralgia neuritis, sciatic nerve    MS for 30 years  . Neurogenic bladder   . PONV (postoperative nausea and vomiting)   . Self-catheterizes urinary bladder   . Type 2 diabetes mellitus ([place])      Past Surgical History:  Procedure Laterality Date  . APPLICATION OF A-CELL OF EXTREMITY Left 09/05/2014   Procedure: PLACEMENT OF ACELL AND VAC;  Surgeon: Theodoro Kos, DO;  Location: [place];  Service: Plastics;  Laterality: Left;  . EXTRACORPOREAL SHOCK WAVE LITHOTRIPSY  03-23-2011  . FINGER SURGERY  2004  . INCISION AND DRAINAGE OF WOUND Left 09/05/2014   Procedure: IRRIGATION AND DEBRIDEMENT OF LEFT ISCHIUM WOUND WITH ;  Surgeon: Theodoro Kos, DO;  Location: [facility];   Service: Plastics;  Laterality: Left;  . TONSILLECTOMY    . WOUND DEBRIDEMENT     [facility]2/17[facility]      Family History  Problem Relation Age of Onset  . Stroke Father   . Diabetes Father   . Diabetes Paternal Grandmother   . Stroke Paternal Grandfather   . Heart disease Neg Hx   . Cancer Neg Hx    Social History:  reports that he quit smoking about 32 years ago. His smoking use included Cigars. He has never used smokeless tobacco. He reports that he drinks about 1.8 oz of alcohol per week . He reports that he does not use drugs.  Allergies: No Known Allergies  Home medications: Prior to Admission medications   Medication Sig Start Date End Date Taking? Authorizing Provider  Ascorbic Acid (VITAMIN C PO) Take 1 tablet by mouth daily.    Yes Historical Provider, MD  aspirin 81 MG tablet Take 1 tablet (81 mg total) by mouth daily. 03/30/11  Yes Rolan Bucco, MD  bisacodyl (DULCOLAX) 10 MG suppository Place 1 suppository (10 mg total) rectally daily as needed for moderate constipation. 08/06/13  Yes Josetta Huddle, MD  Cholecalciferol (VITAMIN D3) 2000 UNITS TABS Take 2,000 mg by mouth daily.     Yes Historical Provider, MD  fish oil-omega-3 fatty acids 1000 MG capsule Take 1 capsule (1 g total) by mouth daily. 03/30/11  Yes Rolan Bucco, MD  HYDROcodone-acetaminophen (NORCO/VICODIN) 5-325 MG tablet Take 1-2 tablets by mouth every 4 (four) hours as needed for moderate pain. 02/05/16  Yes Mir Marry Guan, MD  insulin aspart (NOVOLOG FLEXPEN) 100 UNIT/ML FlexPen Inject 8 Units into the skin 3 (three) times daily with meals. 09/07/14  Yes Domenic Polite, MD  LEVEMIR FLEXTOUCH 100 UNIT/ML Pen Inject 18 Units into the skin daily.  01/28/16  Yes Historical Provider, MD  Multiple Vitamin (MULTIVITAMIN WITH MINERALS) TABS tablet Take 1 tablet by mouth daily.   Yes Historical Provider, MD  oxybutynin (DITROPAN) 5 MG tablet Take 5 mg by mouth 2 (two) times daily.    Yes Historical Provider, MD   simvastatin (ZOCOR) 20 MG tablet Take 20 mg by mouth every evening.   Yes Historical Provider, MD  collagenase (SANTYL) ointment Apply topically daily. 02/06/16   Mir Marry Guan, MD  protein supplement shake (PREMIER PROTEIN) LIQD Take 325 mLs (11 oz total) by mouth 2 (two) times daily between meals. Patient not taking: Reported on 02/14/2016 02/05/16 03/06/16  Mir Marry Guan, MD    Inpatient medications: . aspirin EC  81 mg Oral Daily  . chlorhexidine  15 mL Mouth Rinse BID  . collagenase   Topical Daily  . enoxaparin (LOVENOX) injection  30 mg Subcutaneous Q24H  . furosemide  60 mg Intravenous BID  . insulin aspart  0-5 Units Subcutaneous QHS  . insulin aspart  0-9 Units Subcutaneous TID WC  . insulin detemir  10 Units Subcutaneous Daily  . mouth rinse  15 mL Mouth Rinse q12n4p  . oxybutynin  5 mg Oral BID  . potassium chloride  40 mEq Oral BID  . protein supplement shake  11 oz Oral BID BM  . simvastatin  20 mg Oral QPM    Review of Systems Gen:  Denies headache, fever, chills, sweats.  No weight loss. HEENT:  No visual change, sore throat, difficulty swallowing. Cardio/Resp:  + acute onset last evening of smothering feeling/SOB/chest pressure.  Denies edema. GI:   Denies abdominal pain.   + nausea and lack of appetite  GU: Has foley catheter currently. Urine has been dark but clearing  MS:  Denies joint pain or swelling.   Neuro:   Bilateral LE weakness/R ischial ulcer Psych:  Denies symptoms of depression of anxiety.  No hallucination.    Physical Exam:  Blood pressure (!) 158/77, pulse 89, temperature 98.4 F (36.9 C), temperature source Oral, resp. rate (!) 22, height 6\' 4"  (1.93 m), weight 83.4 kg (183 lb 13.8 oz), SpO2 94 %.  (Weight recorded on 02/04/16 82 kg)  Gen: Tall thin pleasant WM VS as noted On room air, comfortable, sats [id number]% Pale appearing I cannot see any neck vein distension Lung fields are currently clear to my exam Cardiac rhythm  is regular. S1S2 No S3. Soft apical pansystolic murmur. No diastolic murmur or rub. Abdomen protuberant. Bruising from insulin injection. No focal tenderness. Ext in sheepskin lined immobilizing splint devices - trace pretibial edema only Neuro: alert, Ox3, no focal deficit Heme/Lymph: no bruising or LAN I did not turn him over to evaluate the ischial ulcer on the right   Recent Labs Lab 02/14/16 0020 02/14/16 1117  NA 137 136  K 4.5 4.8  CL 112* 108  CO2 17* 19*  GLUCOSE 143* 250*  BUN 90* 95*  CREATININE 3.30* 3.51*  CALCIUM 8.4* 8.6*     Recent Labs Lab 02/14/16 0020  AST 18  ALT 15*  ALKPHOS 68  BILITOT 0.8  PROT 7.0  ALBUMIN 2.5*    Recent Labs Lab 02/14/16 0020 02/14/16 1117  WBC 14.2* 11.0*  NEUTROABS 11.1*  --   HGB 9.7* 9.8*  HCT 28.2* 29.5*  MCV 87.0 90.2  PLT 224 221    Recent Labs Lab 02/14/16 0020 02/14/16 1117  TROPONINI 0.15* 0.19*     Recent Labs Lab 02/14/16 0730 02/14/16 1154  GLUCAP 151* 237*   Results for Lance [name], Lance [name] (MRN [id number]) as of 02/14/2016 17:23   02/14/2016   Protein Creatinine Ratio 5.36 (H)    Xrays/Other Studies: Dg Chest 2 View  Result Date: 02/14/2016 CLINICAL DATA:  Acute onset of wheezing and shortness of breath. Initial encounter. EXAM: CHEST  2 VIEW COMPARISON:  Chest radiograph performed 08/30/2014 FINDINGS: Vascular congestion is noted, with bilateral central airspace opacification and small bilateral pleural effusions, compatible with pulmonary edema. No pneumothorax is seen. The cardiomediastinal silhouette is borderline normal in size. No acute osseous abnormalities are identified. IMPRESSION: Vascular congestion, with bilateral central airspace opacification and small bilateral pleural effusions, compatible with pulmonary edema. Electronically Signed   By: Garald Balding M.D.   On: 02/14/2016 01:35   US Renal  Result Date: 02/14/2016 CLINICAL DATA:  Acute renal failure. EXAM: RENAL /  URINARY TRACT ULTRASOUND COMPLETE COMPARISON:  Renal ultrasound of March 30, 2011. FINDINGS: Right Kidney: Length: 11.2 cm. Two simple cysts are seen in the upper pole, with the largest measuring 2.3 cm. Increased echogenicity of renal parenchyma is noted consistent with medical renal disease. No mass or hydronephrosis visualized. Left Kidney: Length: 10.7 cm. Increased echogenicity of renal parenchyma is noted consistent with medical renal disease. No mass or hydronephrosis visualized. Bladder: Decompressed secondary to Foley catheter. IMPRESSION: Simple right renal cyst. Increased echogenicity of renal parenchyma bilaterally is noted consistent with medical renal disease. Electronically Signed   By: Marijo Conception, M.D.   On: 02/14/2016 08:36    Background: 75 y.o. year-old with PMH significant for DM2, HTN, multiple sclerosis, spastic quadriplegia, urinary retention, h/o decubitus/ischial ulcers, baseline CKD.  Recently hospitalized (9/29-10/4) with R ischial pressure ulcer, that was debrided, treated with 6 days of vanco and zosyn (for this plus a klebsiella and strep UTI) Dscharged on 10/4 (with a creatinine of 1.82.) to finish 4 more days of bactrim.    Was brought back to the hospital from  [facility] (there for rehab) with new onset dyspnea, and hypoxemia. CXR in the ED showed pulmonary edema, creatinine was noted to be elevated at 3.3 and he was admitted. BNP elevated >900, troponins elevated.   ECHO shows LV EF 35% - 40% (down from 50-55% 09/2014), moderate diffuse hypokinesis, mild to moderate MR and Grade 1 diastolic dysfunction. We were asked to see re his AKI on CKD.  Assessment/Recommendations  Acute onset dyspnea with hypoxia  CXR w/pulm edema, BNP elevated, trops +. ECHO with reduction in EF since 2016  Clinical presentation sounds like flash pulmonary edema  Currently sats OK on room air, little in the way of peripheral edema and has had no past diuretic requirement   Would worry  about the possibility that this was acute ischemic event  Having modest UOP response to lasix  Recommend asking cardiology to see  AKI on CKD3  Baseline creatinine 1.8-1.9  50% reduction in GFR since discharge 10/4  COULD have a cardiorenal component (although his cardiopulm sx were quite acute - no history of heart failure)  CXR looks wet but only minimal peripheral edema  Did receive 6 days of vanco last admission (No creatinines since discharge - and as we know  vanco toxicity can appear rather suddenly and dramatically)  Best course of action currently is to simply follow renal function, use lasix on prn basis only  Will not cast out broad net looking for other etiologies just yet  Foley drainage  CKD3  Baseline creatinine 1.8-1.9   Dipstick proteinuria present as far back as 2012  Currently UPC suggests nephrotic range proteinuria (5.36 gm)  Most likely d/t DM  Not quite ready to invoke other GN aside from DM (unless we see progressive loss of renal fx)   Anemia  Required transfusion last admission  Iron deficiency remotely  Check Fe studies  R ischial ulcer  Debrided last adm and treated with Vanco and Zosyn  Hoping for skin flap by Wound Team at [facility] in November  MRI ordered to r/o osteo  No ATB's at this time  IDDM  Per primary     Jamal Maes,  MD [facility] [phone] pager 02/14/2016, 3:38 PM

## 2016-02-14 NOTE — ED Triage Notes (Signed)
Per EMS , pt. from [place] with complaint of respiratory distress around 10 this evening , pt. alert and oriented x4, as reported by facility Nurse , pt.'s O2sat was on the 80's even with O2 therapy, denied any pain. Pt. Was admitted here for UTI a week ago and stated that he completed all his antibiotics. Pt. has foley with cloudy yellowish urine output, afebrile.

## 2016-02-14 NOTE — ED Notes (Signed)
Writer notified EDP of abnormal I-stat trop

## 2016-02-14 NOTE — NC FL2 (Signed)
[place] LEVEL OF CARE SCREENING TOOL     IDENTIFICATION  Patient Name: Lance [name] Birthdate: December 11, 1940 Sex: male Admission Date (Current Location): 02/13/2016  [facility] and [place] Number:  Herbalist and Address:  [facility],  [place] [address], [place]      Provider Number: [id number]  Attending Physician Name and Address:  Murlean Iba, MD  Relative Name and Phone Number:       Current Level of Care: Hospital Recommended Level of Care: [place] Prior Approval Number:    Date Approved/Denied:   PASRR Number: [id number]  Discharge Plan: Home    Current Diagnoses: Patient Active Problem List   Diagnosis Date Noted  . Elevated troponin 02/14/2016  . Severe protein-calorie malnutrition ([place]) 02/14/2016  . Acute CHF (congestive heart failure) ([place]) 02/14/2016  . Depression 02/13/2016  . AKI (acute kidney injury) ([place]) 01/30/2016  . DM (diabetes mellitus) type II controlled with renal manifestation ([place]) 09/06/2015  . Dyslipidemia associated with type 2 diabetes mellitus ([place]) 09/06/2015  . Recurrent UTI (urinary tract infection) 09/06/2015  . Decubitus ulcer of right ischial area 06/14/2015  . Left perineal ischial pressure ulcer   . Chronic kidney disease, stage 3 08/03/2013  . Essential hypertension, benign 08/03/2013  . Cellulitis of foot 08/02/2013  . Spastic quadriplegia ([place]) 07/10/2011  . Multiple sclerosis ([place]) 03/30/2011  . Sacral decubitus ulcer 03/30/2011  . Normocytic anemia 03/30/2011    Orientation RESPIRATION BLADDER Height & Weight     Self, Time, Situation, Place  Normal Indwelling catheter, Continent Weight: 183 lb 13.8 oz (83.4 kg) Height:  6\' 4"  (193 cm)  BEHAVIORAL SYMPTOMS/MOOD NEUROLOGICAL BOWEL NUTRITION STATUS      Incontinent Diet (Heart Healthy/Carb Modified)  AMBULATORY STATUS COMMUNICATION OF NEEDS Skin   Extensive Assist Verbally PU Stage and  Appropriate Care (Pressure Injury 01/30/16 Unstageable - Full thickness tissue loss in which the base of the ulcer is covered by slough (yellow, tan, gray, green or brown) and/or eschar (tan, brown or black) in the wound bed. (right buttock))                       Personal Care Assistance Level of Assistance  Bathing, Dressing Bathing Assistance: Maximum assistance   Dressing Assistance: Maximum assistance     Functional Limitations Info             [place]  PT (By licensed PT), OT (By licensed OT)     PT Frequency: 5 OT Frequency: 5            Contractures      Additional Factors Info  Code Status, Allergies Code Status Info: Fullcode Allergies Info: NKDA           Current Medications (02/14/2016):  This is the current hospital active medication list Current Facility-Administered Medications  Medication Dose Route Frequency Provider Last Rate Last Dose  . acetaminophen (TYLENOL) tablet 650 mg  650 mg Oral Q6H PRN Edwin Dada, MD       Or  . acetaminophen (TYLENOL) suppository 650 mg  650 mg Rectal Q6H PRN Edwin Dada, MD      . aspirin EC tablet 81 mg  81 mg Oral Daily Edwin Dada, MD   81 mg at 02/14/16 0941  . bisacodyl (DULCOLAX) suppository 10 mg  10 mg Rectal Daily PRN Edwin Dada, MD      . chlorhexidine (  PERIDEX) 0.12 % solution 15 mL  15 mL Mouth Rinse BID Edwin Dada, MD   15 mL at 02/14/16 0941  . collagenase (SANTYL) ointment   Topical Daily Edwin Dada, MD      . enoxaparin (LOVENOX) injection 30 mg  30 mg Subcutaneous Q24H Edwin Dada, MD   30 mg at 02/14/16 0939  . feeding supplement (ENSURE ENLIVE) (ENSURE ENLIVE) liquid 237 mL  237 mL Oral BID BM Edwin Dada, MD   237 mL at 02/14/16 0938  . furosemide (LASIX) injection 60 mg  60 mg Intravenous BID [place] Marisa Hua, MD      . HYDROcodone-acetaminophen (NORCO/VICODIN) 5-325 MG per tablet 1-2  tablet  1-2 tablet Oral Q4H PRN Edwin Dada, MD      . insulin aspart (novoLOG) injection 0-5 Units  0-5 Units Subcutaneous QHS Edwin Dada, MD      . insulin aspart (novoLOG) injection 0-9 Units  0-9 Units Subcutaneous TID WC Edwin Dada, MD   2 Units at 02/14/16 [phone]  . insulin detemir (LEVEMIR) injection 10 Units  10 Units Subcutaneous Daily Edwin Dada, MD   10 Units at 02/14/16 1052  . MEDLINE mouth rinse  15 mL Mouth Rinse q12n4p Edwin Dada, MD      . ondansetron (ZOFRAN) tablet 4 mg  4 mg Oral Q6H PRN Edwin Dada, MD       Or  . ondansetron (ZOFRAN) injection 4 mg  4 mg Intravenous Q6H PRN Edwin Dada, MD      . oxybutynin (DITROPAN) tablet 5 mg  5 mg Oral BID Edwin Dada, MD   5 mg at 02/14/16 0941  . perflutren lipid microspheres (DEFINITY) IV suspension  1-10 mL Intravenous PRN Edwin Dada, MD   2 mL at 02/14/16 1107  . potassium chloride SA (K-DUR,KLOR-CON) CR tablet 40 mEq  40 mEq Oral BID Edwin Dada, MD   40 mEq at 02/14/16 0941  . simvastatin (ZOCOR) tablet 20 mg  20 mg Oral QPM Edwin Dada, MD         Discharge Medications: Please see discharge summary for a list of discharge medications.  Relevant Imaging Results:  Relevant Lab Results:   Additional Information SSN: [id number]  Standley Brooking, LCSW

## 2016-02-14 NOTE — Progress Notes (Signed)
02/13/2016 11:56 PM  02/14/2016 12:55 PM  Lance [name] was seen and examined.  The H&P by the admitting provider , orders, imaging was reviewed.  Please see orders.  Will continue to follow.  I called and requested nephrology consultation and ordered MRI sacrum to evaluate for osteomyelitis.   Murvin Natal, MD [facility]

## 2016-02-14 NOTE — Progress Notes (Signed)
  Echocardiogram 2D Echocardiogram with Definity has been performed.  [name], Lance [name], Lance [name]

## 2016-02-14 NOTE — ED Notes (Signed)
Contact is patient's daughter-Francine who lives in [place]:[phone]

## 2016-02-14 NOTE — Consult Note (Signed)
[place] Nurse wound consult note Reason for Consult: Sacral Ulcer Wound type: Pressure ulcer stage 4  to right ischium Pressure Ulcer POA: Yes Measurement:2.8 cm x 6 cm x 3 cm with tunneling extending 4 cm at the 9 o'clock position Wound bed: 50% pink, 50% yellow slough Drainage Minimal yellowish on existing dressing Periwound: intact skin Dressing procedure/placement/frequency: Santyl covered with saline moistened gauze daily.  Saline moistened gauze placed into wound until Santyl arrives.  Discussed POC with patient and bedside nurse.  Re consult if needed, will not follow at this time. Thanks Val Riles MSN, RN, CNS-BC, [employer]

## 2016-02-15 DIAGNOSIS — I5021 Acute systolic (congestive) heart failure: Secondary | ICD-10-CM

## 2016-02-15 DIAGNOSIS — I10 Essential (primary) hypertension: Secondary | ICD-10-CM

## 2016-02-15 DIAGNOSIS — I248 Other forms of acute ischemic heart disease: Secondary | ICD-10-CM

## 2016-02-15 DIAGNOSIS — I428 Other cardiomyopathies: Secondary | ICD-10-CM

## 2016-02-15 DIAGNOSIS — I5041 Acute combined systolic (congestive) and diastolic (congestive) heart failure: Secondary | ICD-10-CM

## 2016-02-15 LAB — GLUCOSE, CAPILLARY
GLUCOSE-CAPILLARY: 190 mg/dL — AB (ref 65–99)
GLUCOSE-CAPILLARY: 152 mg/dL — AB (ref 65–99)
Glucose-Capillary: 177 mg/dL — ABNORMAL HIGH (ref 65–99)
Glucose-Capillary: 179 mg/dL — ABNORMAL HIGH (ref 65–99)

## 2016-02-15 LAB — CBC WITH DIFFERENTIAL/PLATELET
BASOS ABS: 0 10*3/uL (ref 0.0–0.1)
Basophils Relative: 0 %
EOS ABS: 0 10*3/uL (ref 0.0–0.7)
Eosinophils Relative: 0 %
HEMATOCRIT: 27.3 % — AB (ref 39.0–52.0)
HEMOGLOBIN: 9.4 g/dL — AB (ref 13.0–17.0)
LYMPHS PCT: 19 %
Lymphs Abs: 2.1 10*3/uL (ref 0.7–4.0)
MCH: 30.2 pg (ref 26.0–34.0)
MCHC: 34.4 g/dL (ref 30.0–36.0)
MCV: 87.8 fL (ref 78.0–100.0)
MONOS PCT: 8 %
Monocytes Absolute: 0.9 10*3/uL (ref 0.1–1.0)
NEUTROS PCT: 73 %
Neutro Abs: 8.1 10*3/uL — ABNORMAL HIGH (ref 1.7–7.7)
Platelets: 194 10*3/uL (ref 150–400)
RBC: 3.11 MIL/uL — AB (ref 4.22–5.81)
RDW: 16.9 % — ABNORMAL HIGH (ref 11.5–15.5)
WBC: 11.1 10*3/uL — ABNORMAL HIGH (ref 4.0–10.5)

## 2016-02-15 LAB — SEDIMENTATION RATE: SED RATE: 125 mm/h — AB (ref 0–16)

## 2016-02-15 LAB — RENAL FUNCTION PANEL
ALBUMIN: 2.6 g/dL — AB (ref 3.5–5.0)
ANION GAP: 11 (ref 5–15)
BUN: 105 mg/dL — ABNORMAL HIGH (ref 6–20)
CALCIUM: 8.9 mg/dL (ref 8.9–10.3)
CO2: 18 mmol/L — ABNORMAL LOW (ref 22–32)
Chloride: 110 mmol/L (ref 101–111)
Creatinine, Ser: 3.71 mg/dL — ABNORMAL HIGH (ref 0.61–1.24)
GFR, EST AFRICAN AMERICAN: 17 mL/min — AB (ref >60)
GFR, EST NON AFRICAN AMERICAN: 15 mL/min — AB (ref >60)
GLUCOSE: 181 mg/dL — AB (ref 65–99)
Phosphorus: 6.2 mg/dL — ABNORMAL HIGH (ref 2.5–4.6)
Potassium: 5.6 mmol/L — ABNORMAL HIGH (ref 3.5–5.1)
SODIUM: 139 mmol/L (ref 135–145)

## 2016-02-15 LAB — TROPONIN I: Troponin I: 0.23 ng/mL (ref <0.03)

## 2016-02-15 LAB — IRON AND TIBC
Iron: 28 ug/dL — ABNORMAL LOW (ref 45–182)
SATURATION RATIOS: 14 % — AB (ref 17.9–39.5)
TIBC: 193 ug/dL — ABNORMAL LOW (ref 250–450)
UIBC: 165 ug/dL

## 2016-02-15 LAB — HEMOGLOBIN A1C
HEMOGLOBIN A1C: 6 % — AB (ref 4.8–5.6)
MEAN PLASMA GLUCOSE: 126 mg/dL

## 2016-02-15 LAB — CK: CK TOTAL: 33 U/L — AB (ref 49–397)

## 2016-02-15 MED ORDER — CARVEDILOL 3.125 MG PO TABS
3.1250 mg | ORAL_TABLET | Freq: Two times a day (BID) | ORAL | Status: DC
  Administered 2016-02-15 – 2016-02-21 (×12): 3.125 mg via ORAL
  Filled 2016-02-15 (×12): qty 1

## 2016-02-15 MED ORDER — SODIUM CHLORIDE 0.9 % IV SOLN: 8.0000 mg/kg | INTRAVENOUS | Status: DC

## 2016-02-15 MED ORDER — SODIUM POLYSTYRENE SULFONATE 15 GM/60ML PO SUSP
30.0000 g | Freq: Once | ORAL | Status: AC
  Administered 2016-02-15: 30 g via ORAL
  Filled 2016-02-15: qty 120

## 2016-02-15 MED ORDER — SODIUM CHLORIDE 0.9 % IV SOLN
510.0000 mg | INTRAVENOUS | Status: AC
  Administered 2016-02-15 – 2016-02-22 (×2): 510 mg via INTRAVENOUS
  Filled 2016-02-15 (×3): qty 17

## 2016-02-15 MED ORDER — SODIUM CHLORIDE 0.9 % IV SOLN
8.0000 mg/kg | INTRAVENOUS | Status: DC
  Administered 2016-02-15 – 2016-02-17 (×2): 676 mg via INTRAVENOUS
  Filled 2016-02-15 (×2): qty 13.52

## 2016-02-15 NOTE — Care Management Note (Signed)
Case Management Note  Patient Details  Name: HASSEL UPHOFF MRN: [id number] Date of Birth: Dec 26, 1940  Subjective/Objective:    Cardiorenal syndrome, MS with spastic quadruplegia, IDDM                Action/Plan: Discharge Planning: Chart reviewed. CSW following for SNF[facility] when medically stable.   Expected Discharge Date:                Expected Discharge Plan:  Skilled Nursing Facility  In-House Referral:  Clinical Social Work  Discharge planning Services  CM Consult  Post Acute Care Choice:  NA Choice offered to:  NA  DME Arranged:  N/A DME Agency:  NA  HH Arranged:  NA HH Agency:  NA  Status of Service:  Completed, signed off  If discussed at [place] of Stay Meetings, dates discussed:    Additional Comments:  Erenest Rasher, RN 02/15/2016, 5:16 PM

## 2016-02-15 NOTE — Plan of Care (Signed)
Problem: Nutrition: Goal: Adequate nutrition will be maintained Outcome: Not Progressing Pt does not have appetite.

## 2016-02-15 NOTE — Consult Note (Signed)
[facility]  Total days of antibiotics 2        Day 1 daptomycin               Reason for Consult: ischial osteo    Referring Physician: Wynetta Emery  Principal Problem:   Acute CHF (congestive heart failure) ([place]) Active Problems:   Multiple sclerosis ([place])   Sacral decubitus ulcer   Normocytic anemia   Spastic quadriplegia (HCC)   Chronic kidney disease, stage 3   Essential hypertension, benign   DM (diabetes mellitus) type II controlled with renal manifestation (HCC)   Recurrent UTI (urinary tract infection)   AKI (acute kidney injury) ([place])   Elevated troponin   Severe protein-calorie malnutrition (HCC)    HPI: Lance [name] is a 75 y.o. male who is a SNF resident for his MS with spastic quadraplegia, urinary retention with chronic foleu, HTN, well controlled DM2, hx of decub ulcers/ishcial osteo left side in setting of MRSA bacteremia in 2016 treated with vancomycin. He was recently hospitalized at end of sept for right ischial pressure ulcer infection +/- complicated UTI. He was discharged with 4 days of bactrim to [facility] rehab but readmitted on 10/13 due to orthopnea, new onset dyspne. CXR showed pulmonary edema in setting of elevated BNP and cr elevated from 1.8 to 3.3. He is being seen by renal for cardiorenal syndrome. His wounds looked concerning for worsening thus he had MRI and sed rate/crp sent. Sed rate 125, crp 3.4, MRI showing signs concerning of osteo. He is being followed by [facility] who has discussed possible muscle flap surgery for the future. His previous cx from his ischial wounds included isolation of mrsa and enterococcus      MRI FINDINGS: There is partial visualization of marrow edema in the left parasymphyseal pubic bone, unchanged. No fracture is identified.  A large ulceration over the right ischial tuberosity extends to bone. The wound appears to be packed. Mildly increased T2 signal  and thickening are seen about the margins of the wound which could be due to infection or granulation tissue. A small focus of marrow edema is seen subjacent to the ulcer in the ischium compatible with osteomyelitis. No soft tissue abscess is identified. An ulceration is also seen over the left ischial tuberosity which may be healed.  Marked edema is seen in subcutaneous soft tissues over the right greater trochanter. Small focus of mild marrow edema is seen in the underlying greater trochanter.  Bone marrow signal is normal in the sacrum. There is no SI joint effusion. Small amount of fluid is present in the hip joints bilaterally most compatible with physiologic change. No muscle or tendon tear is seen. Imaged intrapelvic contents demonstrated a Foley catheter in place. The urinary bladder is decompressed. Its walls appear markedly thickened.   Past Medical History:  Diagnosis Date  . Anemia in chronic renal disease   . Blood transfusion   . Chronic spastic paraplegia (HCC)    secondary to MS  . CKD (chronic kidney disease), stage III   . Decreased sensation of lower extremity    due to MS  . Decubitus ulcer of ischium   . History of MRSA infection    urine  . History of recurrent UTIs   . Hypertension   . MS (multiple sclerosis) ([place])   . Neuralgia neuritis, sciatic nerve    MS for 30 years  . Neurogenic bladder   . PONV (postoperative nausea and  vomiting)   . Self-catheterizes urinary bladder   . Type 2 diabetes mellitus (HCC)     Allergies: No Known Allergies  MEDICATIONS: . aspirin EC  81 mg Oral Daily  . carvedilol  3.125 mg Oral BID WC  . chlorhexidine  15 mL Mouth Rinse BID  . collagenase   Topical Daily  . DAPTOmycin (CUBICIN)  IV  8 mg/kg Intravenous Q48H  . enoxaparin (LOVENOX) injection  30 mg Subcutaneous Q24H  . ferumoxytol  510 mg Intravenous Weekly  . insulin aspart  0-5 Units Subcutaneous QHS  . insulin aspart  0-9 Units Subcutaneous TID WC   . insulin detemir  10 Units Subcutaneous Daily  . mouth rinse  15 mL Mouth Rinse q12n4p  . multivitamin with minerals  1 tablet Oral Daily  . oxybutynin  5 mg Oral BID  . protein supplement shake  11 oz Oral BID BM    Social History  Substance Use Topics  . Smoking status: Former Smoker    Types: Cigars    Quit date: 05/06/1983  . Smokeless tobacco: Never Used  . Alcohol use 1.8 oz/week    3 Glasses of wine per week     Comment: 3 glasses of wine per week    Family History  Problem Relation Age of Onset  . Stroke Father   . Diabetes Father   . Diabetes Paternal Grandmother   . Stroke Paternal Grandfather   . Heart disease Neg Hx   . Cancer Neg Hx      Review of Systems  Constitutional: Negative for fever, chills, diaphoresis, activity change, appetite change, fatigue and unexpected weight change.  HENT: Negative for congestion, sore throat, rhinorrhea, sneezing, trouble swallowing and sinus pressure.  Eyes: Negative for photophobia and visual disturbance.  Respiratory:+ orthopnea. Negative for cough, chest tightness, shortness of breath, wheezing and stridor.  Cardiovascular: Negative for chest pain, palpitations and leg swelling.  Gastrointestinal: Negative for nausea, vomiting, abdominal pain, diarrhea, constipation, blood in stool, abdominal distention and anal bleeding.  Genitourinary: Negative for dysuria, hematuria, flank pain and difficulty urinating.  Musculoskeletal: Negative for myalgias, back pain, joint swelling, arthralgias and gait problem.  Skin: +ischial wound.Negative for color change, pallor, rash and wound.  Neurological: Negative for dizziness, tremors, weakness and light-headedness.  Hematological: Negative for adenopathy. Does not bruise/bleed easily.  Psychiatric/Behavioral: Negative for behavioral problems, confusion, sleep disturbance, dysphoric mood, decreased concentration and agitation.     OBJECTIVE: Temp:  [97.8 F (36.6 C)-98.3 F (36.8  C)] 97.9 F (36.6 C) (10/14 1500) Pulse Rate:  [92-110] 92 (10/14 1500) Resp:  [20-24] 20 (10/14 1500) BP: (141-178)/(88-99) 141/99 (10/14 1500) SpO2:  [95 %-96 %] 95 % (10/14 1500) Weight:  [186 lb 4.6 oz (84.5 kg)] 186 lb 4.6 oz (84.5 kg) (10/14 0544) Physical Exam  Constitutional: He is oriented to person, place, and time. He appears well-developed and well-nourished. No distress. Frail appearing HENT:  Mouth/Throat: Oropharynx is clear and moist. No oropharyngeal exudate.  Cardiovascular: Normal rate, regular rhythm and normal heart sounds. Exam reveals no gallop and no friction rub.  No murmur heard.  Pulmonary/Chest: Effort normal and breath sounds normal. No respiratory distress. He has no wheezes.  Abdominal: Soft. Bowel sounds are normal. He exhibits no distension. There is no tenderness.  Lymphadenopathy:  He has no cervical adenopathy.  Neurological: He is alert and oriented to person, place, and time.  Skin: Skin is warm and dry. Buttocks is bandaged Psychiatric: He has a normal mood and affect.  His behavior is normal.    LABS: Results for orders placed or performed during the hospital encounter of 02/13/16 (from the past 48 hour(s))  CBC with Differential     Status: Abnormal   Collection Time: 02/14/16 12:20 AM  Result Value Ref Range   WBC 14.2 (H) 4.0 - 10.5 K/uL   RBC 3.24 (L) 4.22 - 5.81 MIL/uL   Hemoglobin 9.7 (L) 13.0 - 17.0 g/dL   HCT 28.2 (L) 39.0 - 52.0 %   MCV 87.0 78.0 - 100.0 fL   MCH 29.9 26.0 - 34.0 pg   MCHC 34.4 30.0 - 36.0 g/dL   RDW 16.3 (H) 11.5 - 15.5 %   Platelets 224 150 - 400 K/uL   Neutrophils Relative % 78 %   Neutro Abs 11.1 (H) 1.7 - 7.7 K/uL   Lymphocytes Relative 13 %   Lymphs Abs 1.9 0.7 - 4.0 K/uL   Monocytes Relative 8 %   Monocytes Absolute 1.1 (H) 0.1 - 1.0 K/uL   Eosinophils Relative 1 %   Eosinophils Absolute 0.1 0.0 - 0.7 K/uL   Basophils Relative 0 %   Basophils Absolute 0.1 0.0 - 0.1 K/uL  Comprehensive metabolic  panel     Status: Abnormal   Collection Time: 02/14/16 12:20 AM  Result Value Ref Range   Sodium 137 135 - 145 mmol/L   Potassium 4.5 3.5 - 5.1 mmol/L   Chloride 112 (H) 101 - 111 mmol/L   CO2 17 (L) 22 - 32 mmol/L   Glucose, Bld 143 (H) 65 - 99 mg/dL   BUN 90 (H) 6 - 20 mg/dL   Creatinine, Ser 3.30 (H) 0.61 - 1.24 mg/dL   Calcium 8.4 (L) 8.9 - 10.3 mg/dL   Total Protein 7.0 6.5 - 8.1 g/dL   Albumin 2.5 (L) 3.5 - 5.0 g/dL   AST 18 15 - 41 U/L   ALT 15 (L) 17 - 63 U/L   Alkaline Phosphatase 68 38 - 126 U/L   Total Bilirubin 0.8 0.3 - 1.2 mg/dL   GFR calc non Af Amer 17 (L) >60 mL/min   GFR calc Af Amer 20 (L) >60 mL/min    Comment: (NOTE) The eGFR has been calculated using the CKD EPI equation. This calculation has not been validated in all clinical situations. eGFR's persistently <60 mL/min signify possible Chronic Kidney Disease.    Anion gap 8 5 - 15  Troponin I     Status: Abnormal   Collection Time: 02/14/16 12:20 AM  Result Value Ref Range   Troponin I 0.15 (HH) <0.03 ng/mL    Comment: CRITICAL RESULT CALLED TO, READ BACK BY AND VERIFIED WITH: OXIDINE,J RN 10.13.17 '@[id number]'  ZANDO,C   Brain natriuretic peptide     Status: Abnormal   Collection Time: 02/14/16 12:20 AM  Result Value Ref Range   B Natriuretic Peptide 927.0 (H) 0.0 - 100.0 pg/mL  I-Stat Troponin, ED (not at [facility])     Status: Abnormal   Collection Time: 02/14/16 12:29 AM  Result Value Ref Range   Troponin i, poc 0.16 (HH) 0.00 - 0.08 ng/mL   Comment NOTIFIED PHYSICIAN    Comment 3            Comment: Due to the release kinetics of cTnI, a negative result within the first hours of the onset of symptoms does not rule out myocardial infarction with certainty. If myocardial infarction is still suspected, repeat the test at appropriate intervals.   Urinalysis, Routine w reflex microscopic (  not at [facility])     Status: Abnormal   Collection Time: 02/14/16  1:07 AM  Result Value Ref Range   Color, Urine YELLOW  YELLOW   APPearance TURBID (A) CLEAR   Specific Gravity, Urine 1.020 1.005 - 1.030   pH 5.5 5.0 - 8.0   Glucose, UA NEGATIVE NEGATIVE mg/dL   Hgb urine dipstick LARGE (A) NEGATIVE   Bilirubin Urine NEGATIVE NEGATIVE   Ketones, ur NEGATIVE NEGATIVE mg/dL   Protein, ur >300 (A) NEGATIVE mg/dL   Nitrite POSITIVE (A) NEGATIVE   Leukocytes, UA LARGE (A) NEGATIVE  Urine microscopic-add on     Status: Abnormal   Collection Time: 02/14/16  1:07 AM  Result Value Ref Range   Squamous Epithelial / LPF 0-5 (A) NONE SEEN   WBC, UA 6-30 0 - 5 WBC/hpf   RBC / HPF 6-30 0 - 5 RBC/hpf   Bacteria, UA FEW (A) NONE SEEN  Lactic acid, plasma     Status: None   Collection Time: 02/14/16  2:20 AM  Result Value Ref Range   Lactic Acid, Venous 0.7 0.5 - 1.9 mmol/L  Sodium, urine, random     Status: None   Collection Time: 02/14/16  4:55 AM  Result Value Ref Range   Sodium, Ur 105 mmol/L    Comment: Performed at [facility]  Creatinine, urine, random     Status: None   Collection Time: 02/14/16  4:55 AM  Result Value Ref Range   Creatinine, Urine 33.68 mg/dL    Comment: Performed at [facility] / creatinine ratio, urine     Status: Abnormal   Collection Time: 02/14/16  4:55 AM  Result Value Ref Range   Creatinine, Urine 33.74 mg/dL   Total Protein, Urine 181 mg/dL    Comment: NO NORMAL RANGE ESTABLISHED FOR THIS TEST RESULTS CONFIRMED BY MANUAL DILUTION    Protein Creatinine Ratio 5.36 (H) 0.00 - 0.15 mg/mg[Cre]    Comment: Performed at [facility]  Sedimentation rate     Status: Abnormal   Collection Time: 02/14/16  5:28 AM  Result Value Ref Range   Sed Rate 119 (H) 0 - 16 mm/hr  C-reactive protein     Status: Abnormal   Collection Time: 02/14/16  5:28 AM  Result Value Ref Range   CRP 3.4 (H) <1.0 mg/dL    Comment: Performed at [facility]  Glucose, capillary     Status: Abnormal   Collection Time: 02/14/16  7:30 AM  Result Value Ref Range    Glucose-Capillary 151 (H) 65 - 99 mg/dL  CBC     Status: Abnormal   Collection Time: 02/14/16 11:17 AM  Result Value Ref Range   WBC 11.0 (H) 4.0 - 10.5 K/uL   RBC 3.27 (L) 4.22 - 5.81 MIL/uL   Hemoglobin 9.8 (L) 13.0 - 17.0 g/dL   HCT 29.5 (L) 39.0 - 52.0 %   MCV 90.2 78.0 - 100.0 fL   MCH 30.0 26.0 - 34.0 pg   MCHC 33.2 30.0 - 36.0 g/dL   RDW 16.8 (H) 11.5 - 15.5 %   Platelets 221 150 - 400 K/uL  Hemoglobin A1c     Status: Abnormal   Collection Time: 02/14/16 11:17 AM  Result Value Ref Range   Hgb A1c MFr Bld 6.0 (H) 4.8 - 5.6 %    Comment: (NOTE)         Pre-diabetes: 5.7 - 6.4  Diabetes: >6.4         Glycemic control for adults with diabetes: <7.0    Mean Plasma Glucose 126 mg/dL    Comment: (NOTE) Performed At: [facility] [place], [place] [id number] Lindon Romp MD [id number]   Basic metabolic panel     Status: Abnormal   Collection Time: 02/14/16 11:17 AM  Result Value Ref Range   Sodium 136 135 - 145 mmol/L   Potassium 4.8 3.5 - 5.1 mmol/L   Chloride 108 101 - 111 mmol/L   CO2 19 (L) 22 - 32 mmol/L   Glucose, Bld 250 (H) 65 - 99 mg/dL   BUN 95 (H) 6 - 20 mg/dL   Creatinine, Ser 3.51 (H) 0.61 - 1.24 mg/dL   Calcium 8.6 (L) 8.9 - 10.3 mg/dL   GFR calc non Af Amer 16 (L) >60 mL/min   GFR calc Af Amer 18 (L) >60 mL/min    Comment: (NOTE) The eGFR has been calculated using the CKD EPI equation. This calculation has not been validated in all clinical situations. eGFR's persistently <60 mL/min signify possible Chronic Kidney Disease.    Anion gap 9 5 - 15  Troponin I     Status: Abnormal   Collection Time: 02/14/16 11:17 AM  Result Value Ref Range   Troponin I 0.19 (HH) <0.03 ng/mL    Comment: CRITICAL VALUE NOTED.  VALUE IS CONSISTENT WITH PREVIOUSLY REPORTED AND CALLED VALUE.  Glucose, capillary     Status: Abnormal   Collection Time: 02/14/16 11:54 AM  Result Value Ref Range   Glucose-Capillary 237 (H) 65 - 99 mg/dL    Glucose, capillary     Status: Abnormal   Collection Time: 02/14/16  4:44 PM  Result Value Ref Range   Glucose-Capillary 169 (H) 65 - 99 mg/dL  Glucose, capillary     Status: Abnormal   Collection Time: 02/14/16  9:36 PM  Result Value Ref Range   Glucose-Capillary 173 (H) 65 - 99 mg/dL  CBC with Differential/Platelet     Status: Abnormal   Collection Time: 02/15/16  5:32 AM  Result Value Ref Range   WBC 11.1 (H) 4.0 - 10.5 K/uL   RBC 3.11 (L) 4.22 - 5.81 MIL/uL   Hemoglobin 9.4 (L) 13.0 - 17.0 g/dL   HCT 27.3 (L) 39.0 - 52.0 %   MCV 87.8 78.0 - 100.0 fL   MCH 30.2 26.0 - 34.0 pg   MCHC 34.4 30.0 - 36.0 g/dL   RDW 16.9 (H) 11.5 - 15.5 %   Platelets 194 150 - 400 K/uL   Neutrophils Relative % 73 %   Lymphocytes Relative 19 %   Monocytes Relative 8 %   Eosinophils Relative 0 %   Basophils Relative 0 %   Neutro Abs 8.1 (H) 1.7 - 7.7 K/uL   Lymphs Abs 2.1 0.7 - 4.0 K/uL   Monocytes Absolute 0.9 0.1 - 1.0 K/uL   Eosinophils Absolute 0.0 0.0 - 0.7 K/uL   Basophils Absolute 0.0 0.0 - 0.1 K/uL   WBC Morphology MILD LEFT SHIFT (1-5% METAS, OCC MYELO, OCC BANDS)   Renal function panel     Status: Abnormal   Collection Time: 02/15/16  5:32 AM  Result Value Ref Range   Sodium 139 135 - 145 mmol/L   Potassium 5.6 (H) 3.5 - 5.1 mmol/L   Chloride 110 101 - 111 mmol/L   CO2 18 (L) 22 - 32 mmol/L   Glucose, Bld 181 (H) 65 -  99 mg/dL   BUN 105 (H) 6 - 20 mg/dL    Comment: RESULTS CONFIRMED BY MANUAL DILUTION   Creatinine, Ser 3.71 (H) 0.61 - 1.24 mg/dL   Calcium 8.9 8.9 - 10.3 mg/dL   Phosphorus 6.2 (H) 2.5 - 4.6 mg/dL   Albumin 2.6 (L) 3.5 - 5.0 g/dL   GFR calc non Af Amer 15 (L) >60 mL/min   GFR calc Af Amer 17 (L) >60 mL/min    Comment: (NOTE) The eGFR has been calculated using the CKD EPI equation. This calculation has not been validated in all clinical situations. eGFR's persistently <60 mL/min signify possible Chronic Kidney Disease.    Anion gap 11 5 - 15  Iron and TIBC      Status: Abnormal   Collection Time: 02/15/16  5:32 AM  Result Value Ref Range   Iron 28 (L) 45 - 182 ug/dL   TIBC 193 (L) 250 - 450 ug/dL   Saturation Ratios 14 (L) 17.9 - 39.5 %   UIBC 165 ug/dL    Comment: Performed at [facility]  Troponin I     Status: Abnormal   Collection Time: 02/15/16  5:32 AM  Result Value Ref Range   Troponin I 0.23 (HH) <0.03 ng/mL    Comment: CRITICAL VALUE NOTED.  VALUE IS CONSISTENT WITH PREVIOUSLY REPORTED AND CALLED VALUE.  Glucose, capillary     Status: Abnormal   Collection Time: 02/15/16  9:01 AM  Result Value Ref Range   Glucose-Capillary 177 (H) 65 - 99 mg/dL   Comment 1 Notify RN   Glucose, capillary     Status: Abnormal   Collection Time: 02/15/16 11:49 AM  Result Value Ref Range   Glucose-Capillary 190 (H) 65 - 99 mg/dL   Comment 1 Notify RN   Sedimentation rate     Status: Abnormal   Collection Time: 02/15/16  2:28 PM  Result Value Ref Range   Sed Rate 125 (H) 0 - 16 mm/hr  CK     Status: Abnormal   Collection Time: 02/15/16  2:28 PM  Result Value Ref Range   Total CK 33 (L) 49 - 397 U/L    MICRO:  IMAGING: Dg Chest 2 View  Result Date: 02/14/2016 CLINICAL DATA:  Acute onset of wheezing and shortness of breath. Initial encounter. EXAM: CHEST  2 VIEW COMPARISON:  Chest radiograph performed 08/30/2014 FINDINGS: Vascular congestion is noted, with bilateral central airspace opacification and small bilateral pleural effusions, compatible with pulmonary edema. No pneumothorax is seen. The cardiomediastinal silhouette is borderline normal in size. No acute osseous abnormalities are identified. IMPRESSION: Vascular congestion, with bilateral central airspace opacification and small bilateral pleural effusions, compatible with pulmonary edema. Electronically Signed   By: Garald Balding M.D.   On: 02/14/2016 01:35   US Renal  Result Date: 02/14/2016 CLINICAL DATA:  Acute renal failure. EXAM: RENAL / URINARY TRACT ULTRASOUND COMPLETE  COMPARISON:  Renal ultrasound of March 30, 2011. FINDINGS: Right Kidney: Length: 11.2 cm. Two simple cysts are seen in the upper pole, with the largest measuring 2.3 cm. Increased echogenicity of renal parenchyma is noted consistent with medical renal disease. No mass or hydronephrosis visualized. Left Kidney: Length: 10.7 cm. Increased echogenicity of renal parenchyma is noted consistent with medical renal disease. No mass or hydronephrosis visualized. Bladder: Decompressed secondary to Foley catheter. IMPRESSION: Simple right renal cyst. Increased echogenicity of renal parenchyma bilaterally is noted consistent with medical renal disease. Electronically Signed   By: Marijo Conception,  M.D.   On: 02/14/2016 08:36   Mr Sacrum/si Joints Wo Contrast  Result Date: 02/14/2016 CLINICAL DATA:  Right ischial pressure ulcer since September of this year. Wound culture from 01/30/2016 grew MRSA. EXAM: MR SACRUM WITHOUT CONTRAST TECHNIQUE: Multiplanar, multisequence MR imaging was performed. No intravenous contrast was administered. COMPARISON:  MRI of the pelvis 09/01/2014. FINDINGS: There is partial visualization of marrow edema in the left parasymphyseal pubic bone, unchanged. No fracture is identified. A large ulceration over the right ischial tuberosity extends to bone. The wound appears to be packed. Mildly increased T2 signal and thickening are seen about the margins of the wound which could be due to infection or granulation tissue. A small focus of marrow edema is seen subjacent to the ulcer in the ischium compatible with osteomyelitis. No soft tissue abscess is identified. An ulceration is also seen over the left ischial tuberosity which may be healed. Marked edema is seen in subcutaneous soft tissues over the right greater trochanter. Small focus of mild marrow edema is seen in the underlying greater trochanter. Bone marrow signal is normal in the sacrum. There is no SI joint effusion. Small amount of fluid is  present in the hip joints bilaterally most compatible with physiologic change. No muscle or tendon tear is seen. Imaged intrapelvic contents demonstrated a Foley catheter in place. The urinary bladder is decompressed. Its walls appear markedly thickened. IMPRESSION: Large ulceration over the right ischial tuberosity extends to bone. Mild marrow edema is seen in the subjacent ischial tuberosity compatible with osteomyelitis. Subcutaneous edema over the right greater trochanter may be due to cellulitis. Small focus of mild marrow edema in the greater trochanter is worrisome for osteomyelitis. The urinary bladder is nearly completely decompressed but its walls appear markedly thickened which may be due to chronic bladder outlet obstruction and/or cystitis. Electronically Signed   By: Inge Rise M.D.   On: 02/14/2016 16:03    HISTORICAL MICRO/IMAGING  Assessment/Plan:  75yo M with decub ischial wound, hx of MSSA osteo, and muscle flap repair who is admitted for worsening shortness of breath, orthopnea as it relates to cardiorenal syndrome but also found to have worsening right ischial wound c/w osteomyelitis   - recommend 6 wk of daptomycin at 95m/kg, QOd (renal dosing), will need weekly CK - may need to adjust dose if kidney function improves  Acute on chronic kidney disease= unclear etiology, appreciate renal consultation, appears that he may need to undergo renal biopsy  Moderated protein-caloric malnutrition = loss of appetite maybe related to worsening renal function. Recommend to continue to supplement dietary intake to help with wound care. May need nutritional consultation

## 2016-02-15 NOTE — Progress Notes (Signed)
PROGRESS NOTE    Lance [name]  UDJ:[id number]  [name]: Sep 24, 1940  DOA: 02/13/2016 PCP: [name], MD Outpatient Specialists:   Hospital course: The patient is a 75 y.o. year-old with PMH significant for DM2, HTN, multiple sclerosis, spastic quadriplegia, urinary retention, h/o decubitus ulcers/ischial osteo Left (2016 with MRSA bacteremia treated with 7 weeks of vancomycin).  He was recently hospitalized (9/29-10/4) because of a wound associated with a R ischial pressure ulcer, that was debrided at bedside, treated with 6 days of vanco and zosyn ( for this plus a klebsiella and strep UTI) He was discharged on 10/4 (with a creatinine of 1.82.) to finish 4 more days of bactrim.  He was brought back to the hospital from [facility] with new onset dyspnea, and hypoxemia. CXR in the ED showed pulmonary edema, creatinine was noted to be elevated at 3.3 and he was admitted. BNP elevated >900, troponins elevated.  Assessment & Plan:   1. Cardiorenal syndrome on CKD III:  Hyperkalemia noted today, ordered kayexalate x 1, lasix given, recheck in AM. Baseline Cr 1.7.  Baseline EF 50%, no previous CHF flare.  Unclear precipitating factor at present (no recent NSAIDs, no known hypotension at facility, no recent contrast exposure).  No casts on UA, mild pyuria, recent antibiotics with Bactrim but suspect most likely congestive nephropathy/cardiorenal syndrome.  Pt admitted for diuresis with IV lasix and feeling better.  Renal function not improved, nephrology team consulted.   2. MS with spastic quadruplegia: Air overlay mattress, frequent turning  3. Normocytic anemia:  Stable hemoglobin, following  4. Leukocytosis:  Likely secondary to right ischial sacral wound infection with osteomyelitis.  Ulcer not festering, but noted that CRP/ESR elevated during last hospitalization (and he had osteomyelitis of the pelvis >1 year ago in the setting of TEE- MRSA bacteremia for which he had 7 weeks  of IV vancomycin with ID, Dr. Johnnye Sima) -Hold antibiotics for now, clinically stable -Check ESR, CRP -Low threshold for MRI pelvis and ID consult if elevated  5. IDDM:  -Continue Levemir, lower dose given renal failure -SSI with meals  CBG (last 3)   Recent Labs  02/14/16 2136 02/15/16 0901 02/15/16 1149  GLUCAP 173* 177* 190*   6. HTN:  Currently hypertensive in the setting of fluid overload. -Continue aspirin and statin, coreg  7. Recurrent UTI with indwelling foley:  Foley last changed before last course of antibiotics. -Continue oxybutynin  8. Severe protein calorie malnutrition: Continue to encourage supplements, consulted dietitian  9. Sacral decubitus ulcer: MRI sacrum positive for osteomyelitis, ID consulted, would start daptomycin for now given recent MRSA positive wound culture from 01/30/16 -Consult WOC, Pt is followed by plastic surgery at [facility]  DVT prophylaxis: Lovenox  Code Status: FULL  Family Communication: None present  Disposition Plan: Anticipate diuresis and close monitoring of renal function Consults called: None overnight Admission status: INPATIENT, telemetry  Consultants:  Nephrology  Cardiology  ID  Procedures:  MRI sacrum  Subjective: Pt reports that he has less shortness of breath but does not have appetite and not eating well  Objective: Vitals:   02/14/16 1500 02/14/16 2134 02/15/16 0544 02/15/16 0840  BP: (!) 155/72 (!) 178/97 (!) 162/88 (!) 169/99  Pulse: 95 (!) 104 96 (!) 110  Resp: 20 (!) 24 20 (!) 24  Temp: 98.2 F (36.8 C) 98.3 F (36.8 C) 97.8 F (36.6 C)   TempSrc: Oral Oral Oral   SpO2: 94% 95% 96% 96%  Weight:   84.5 kg (  186 lb 4.6 oz)   Height:        Intake/Output Summary (Last 24 hours) at 02/15/16 1355 Last data filed at 02/15/16 0973  Gross per 24 hour  Intake              445 ml  Output             1150 ml  Net             -705 ml   Filed Weights   02/14/16 0355  02/15/16 0544  Weight: 83.4 kg (183 lb 13.8 oz) 84.5 kg (186 lb 4.6 oz)    Exam:  General exam:  Frail debilitated elderly male, chronically ill appearing, alert and in no acute distress. Respiratory system: scattered crackles heard. No increased work of breathing. Cardiovascular system: S1 & S2 heard, RRR. No JVD, murmurs, gallops, clicks or pedal edema. Gastrointestinal system: Abdomen is nondistended, soft and nontender. Normal bowel sounds heard. Central nervous system: Alert and oriented. No focal neurological deficits. Extremities: no CCE. Skin: large right ischial stage 4 ulcer  Data Reviewed: Basic Metabolic Panel:  Recent Labs Lab 02/14/16 0020 02/14/16 1117 02/15/16 0532  NA 137 136 139  K 4.5 4.8 5.6*  CL 112* 108 110  CO2 17* 19* 18*  GLUCOSE 143* 250* 181*  BUN 90* 95* 105*  CREATININE 3.30* 3.51* 3.71*  CALCIUM 8.4* 8.6* 8.9  PHOS  --   --  6.2*   Liver Function Tests:  Recent Labs Lab 02/14/16 0020 02/15/16 0532  AST 18  --   ALT 15*  --   ALKPHOS 68  --   BILITOT 0.8  --   PROT 7.0  --   ALBUMIN 2.5* 2.6*   No results for input(s): LIPASE, AMYLASE in the last 168 hours. No results for input(s): AMMONIA in the last 168 hours. CBC:  Recent Labs Lab 02/14/16 0020 02/14/16 1117 02/15/16 0532  WBC 14.2* 11.0* 11.1*  NEUTROABS 11.1*  --  8.1*  HGB 9.7* 9.8* 9.4*  HCT 28.2* 29.5* 27.3*  MCV 87.0 90.2 87.8  PLT 224 221 194   Cardiac Enzymes:  Recent Labs Lab 02/14/16 0020 02/14/16 1117 02/15/16 0532  TROPONINI 0.15* 0.19* 0.23*   CBG (last 3)   Recent Labs  02/14/16 2136 02/15/16 0901 02/15/16 1149  GLUCAP 173* 177* 190*   No results found for this or any previous visit (from the past 240 hour(s)).   Studies: Dg Chest 2 View  Result Date: 02/14/2016 CLINICAL DATA:  Acute onset of wheezing and shortness of breath. Initial encounter. EXAM: CHEST  2 VIEW COMPARISON:  Chest radiograph performed 08/30/2014 FINDINGS: Vascular  congestion is noted, with bilateral central airspace opacification and small bilateral pleural effusions, compatible with pulmonary edema. No pneumothorax is seen. The cardiomediastinal silhouette is borderline normal in size. No acute osseous abnormalities are identified. IMPRESSION: Vascular congestion, with bilateral central airspace opacification and small bilateral pleural effusions, compatible with pulmonary edema. Electronically Signed   By: Garald Balding M.D.   On: 02/14/2016 01:35   US Renal  Result Date: 02/14/2016 CLINICAL DATA:  Acute renal failure. EXAM: RENAL / URINARY TRACT ULTRASOUND COMPLETE COMPARISON:  Renal ultrasound of March 30, 2011. FINDINGS: Right Kidney: Length: 11.2 cm. Two simple cysts are seen in the upper pole, with the largest measuring 2.3 cm. Increased echogenicity of renal parenchyma is noted consistent with medical renal disease. No mass or hydronephrosis visualized. Left Kidney: Length: 10.7 cm. Increased echogenicity of renal parenchyma  is noted consistent with medical renal disease. No mass or hydronephrosis visualized. Bladder: Decompressed secondary to Foley catheter. IMPRESSION: Simple right renal cyst. Increased echogenicity of renal parenchyma bilaterally is noted consistent with medical renal disease. Electronically Signed   By: Marijo Conception, M.D.   On: 02/14/2016 08:36   Mr Sacrum/si Joints Wo Contrast  Result Date: 02/14/2016 CLINICAL DATA:  Right ischial pressure ulcer since September of this year. Wound culture from 01/30/2016 grew MRSA. EXAM: MR SACRUM WITHOUT CONTRAST TECHNIQUE: Multiplanar, multisequence MR imaging was performed. No intravenous contrast was administered. COMPARISON:  MRI of the pelvis 09/01/2014. FINDINGS: There is partial visualization of marrow edema in the left parasymphyseal pubic bone, unchanged. No fracture is identified. A large ulceration over the right ischial tuberosity extends to bone. The wound appears to be packed.  Mildly increased T2 signal and thickening are seen about the margins of the wound which could be due to infection or granulation tissue. A small focus of marrow edema is seen subjacent to the ulcer in the ischium compatible with osteomyelitis. No soft tissue abscess is identified. An ulceration is also seen over the left ischial tuberosity which may be healed. Marked edema is seen in subcutaneous soft tissues over the right greater trochanter. Small focus of mild marrow edema is seen in the underlying greater trochanter. Bone marrow signal is normal in the sacrum. There is no SI joint effusion. Small amount of fluid is present in the hip joints bilaterally most compatible with physiologic change. No muscle or tendon tear is seen. Imaged intrapelvic contents demonstrated a Foley catheter in place. The urinary bladder is decompressed. Its walls appear markedly thickened. IMPRESSION: Large ulceration over the right ischial tuberosity extends to bone. Mild marrow edema is seen in the subjacent ischial tuberosity compatible with osteomyelitis. Subcutaneous edema over the right greater trochanter may be due to cellulitis. Small focus of mild marrow edema in the greater trochanter is worrisome for osteomyelitis. The urinary bladder is nearly completely decompressed but its walls appear markedly thickened which may be due to chronic bladder outlet obstruction and/or cystitis. Electronically Signed   By: Inge Rise M.D.   On: 02/14/2016 16:03   Scheduled Meds: . aspirin EC  81 mg Oral Daily  . carvedilol  3.125 mg Oral BID WC  . chlorhexidine  15 mL Mouth Rinse BID  . collagenase   Topical Daily  . DAPTOmycin (CUBICIN)  IV  8 mg/kg Intravenous Q24H  . enoxaparin (LOVENOX) injection  30 mg Subcutaneous Q24H  . insulin aspart  0-5 Units Subcutaneous QHS  . insulin aspart  0-9 Units Subcutaneous TID WC  . insulin detemir  10 Units Subcutaneous Daily  . mouth rinse  15 mL Mouth Rinse q12n4p  . multivitamin  with minerals  1 tablet Oral Daily  . oxybutynin  5 mg Oral BID  . protein supplement shake  11 oz Oral BID BM  . simvastatin  20 mg Oral QPM   Continuous Infusions:   Principal Problem:   Acute CHF (congestive heart failure) (HCC) Active Problems:   Multiple sclerosis (HCC)   Sacral decubitus ulcer   Normocytic anemia   Spastic quadriplegia (HCC)   Chronic kidney disease, stage 3   Essential hypertension, benign   DM (diabetes mellitus) type II controlled with renal manifestation (HCC)   Recurrent UTI (urinary tract infection)   AKI (acute kidney injury) ([place])   Elevated troponin   Severe protein-calorie malnutrition ([place])  Time spent:   Irwin Brakeman, MD, FAAFP  [facility] Pager [phone]  If 7PM-7AM, please contact night-coverage [email or web address] Password [email or web address] 02/15/2016, 1:55 PM    LOS: 1 day

## 2016-02-15 NOTE — Consult Note (Addendum)
CARDIOLOGY CONSULT NOTE  Patient ID: Lance [name] MRN: [id number] [name]/AGE: 11/19/1940 75 y.o.  Admit date: 02/13/2016 Primary Physician: [name], MD Referring Physician: Lorrene Reid  Reason for Consultation: cardiomyopathy, pulmonary edema  HPI: 75 yr old male with PMH significant for multiple sclerosis (diagnosed in early 30's) with quadriplegia, chronic decubitus ulcer, anemia, osteomyelitis, CKD stage III, HTN, IDDM, recurrent UTI's who presented with new onset of dyspnea and found to have newly depressed LV systolic function, EF 50-93% (no regional wall motion abnormalities, EF previously 50-55% 09/02/14) and acute on chronic renal failure.  Recently hospitalized for wound associated with right ischial pressure ulcer treated with vancomycin and Zosyn.  CXR showed pulmonary edema with BNP elevated at 927. Given IV Lasix and currently on 60 mg IV BID with a recorded urinary output of 348 cc in last 24 hrs. Being followed by nephrology as well. BUN 105, creatinine 3.71.  On the evening of 10/12, he felt short of breath with chest pressure when he lied down. Denies leg swelling. No personal h/o CAD.  Currently denies chest pain and never experienced any prior symptoms. Says he's never had issues with his heart. Says breathing is improved when compared to yesterday.  Lifts arm weights to maintain upper body strength, says he may have had some premature fatigue recently with this. Is wheelchair bound.  Soc: Priscella Mann in in [place], [place]. 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 Has children in [place], [place], and one son at home who lives with he and his wife.     No Known Allergies  Current Facility-Administered Medications  Medication Dose Route Frequency Provider Last Rate Last Dose  . acetaminophen (TYLENOL) tablet 650 mg  650 mg Oral Q6H PRN Lance Dada, MD       Or  .  acetaminophen (TYLENOL) suppository 650 mg  650 mg Rectal Q6H PRN Lance Dada, MD      . aspirin EC tablet 81 mg  81 mg Oral Daily Lance Dada, MD   81 mg at 02/14/16 0941  . bisacodyl (DULCOLAX) suppository 10 mg  10 mg Rectal Daily PRN Lance Dada, MD      . chlorhexidine (PERIDEX) 0.12 % solution 15 mL  15 mL Mouth Rinse BID Lance Dada, MD   15 mL at 02/14/16 2120  . collagenase (SANTYL) ointment   Topical Daily Lance Dada, MD      . enoxaparin (LOVENOX) injection 30 mg  30 mg Subcutaneous Q24H Lance Dada, MD   30 mg at 02/14/16 0939  . furosemide (LASIX) injection 60 mg  60 mg Intravenous BID Clanford Marisa Hua, MD   60 mg at 02/14/16 2120  . HYDROcodone-acetaminophen (NORCO/VICODIN) 5-325 MG per tablet 1-2 tablet  1-2 tablet Oral Q4H PRN Lance Dada, MD      . insulin aspart (novoLOG) injection 0-5 Units  0-5 Units Subcutaneous QHS Lance Dada, MD      . insulin aspart (novoLOG) injection 0-9 Units  0-9 Units Subcutaneous TID WC Lance Dada, MD   2 Units at 02/14/16 1723  . insulin detemir (LEVEMIR) injection 10 Units  10 Units Subcutaneous Daily Lance Dada, MD   10 Units at 02/14/16 1052  . MEDLINE mouth rinse  15 mL Mouth Rinse q12n4p Lance Dada, MD   15 mL at 02/14/16 1724  .  multivitamin with minerals tablet 1 tablet  1 tablet Oral Daily Clanford Marisa Hua, MD   1 tablet at 02/14/16 1724  . ondansetron (ZOFRAN) tablet 4 mg  4 mg Oral Q6H PRN Lance Dada, MD       Or  . ondansetron (ZOFRAN) injection 4 mg  4 mg Intravenous Q6H PRN Lance Dada, MD      . oxybutynin (DITROPAN) tablet 5 mg  5 mg Oral BID Lance Dada, MD   5 mg at 02/14/16 2120  . protein supplement (PREMIER PROTEIN) liquid  11 oz Oral BID BM Clanford Marisa Hua, MD   11 oz at 02/14/16 1725  . simvastatin (ZOCOR) tablet 20 mg  20 mg Oral QPM Lance Dada, MD   20 mg at 02/14/16  1724    Past Medical History:  Diagnosis Date  . Anemia in chronic renal disease   . Blood transfusion   . Chronic spastic paraplegia (HCC)    secondary to MS  . CKD (chronic kidney disease), stage III   . Decreased sensation of lower extremity    due to MS  . Decubitus ulcer of ischium   . History of MRSA infection    urine  . History of recurrent UTIs   . Hypertension   . MS (multiple sclerosis) ([place])   . Neuralgia neuritis, sciatic nerve    MS for 30 years  . Neurogenic bladder   . PONV (postoperative nausea and vomiting)   . Self-catheterizes urinary bladder   . Type 2 diabetes mellitus ([place])     Past Surgical History:  Procedure Laterality Date  . APPLICATION OF A-CELL OF EXTREMITY Left 09/05/2014   Procedure: PLACEMENT OF ACELL AND VAC;  Surgeon: Theodoro Kos, DO;  Location: [place];  Service: Plastics;  Laterality: Left;  . EXTRACORPOREAL SHOCK WAVE LITHOTRIPSY  03-23-2011  . FINGER SURGERY  2004  . INCISION AND DRAINAGE OF WOUND Left 09/05/2014   Procedure: IRRIGATION AND DEBRIDEMENT OF LEFT ISCHIUM WOUND WITH ;  Surgeon: Theodoro Kos, DO;  Location: [facility];  Service: Plastics;  Laterality: Left;  . TONSILLECTOMY    . WOUND DEBRIDEMENT     [facility]/2/17[facility]     Social History   Social History  . Marital status: Married    Spouse name: N/A  . Number of children: N/A  . Years of education: N/A   Occupational History  . Not on file.   Social History Main Topics  . Smoking status: Former Smoker    Types: Cigars    Quit date: 05/06/1983  . Smokeless tobacco: Never Used  . Alcohol use 1.8 oz/week    3 Glasses of wine per week     Comment: 3 glasses of wine per week  . Drug use: No  . Sexual activity: Not Currently    Birth control/ protection: None   Other Topics Concern  . Not on file   Social History Narrative  . No narrative on file     No family history of premature CAD in 1st degree relatives.  Prior to Admission medications   Medication Sig Start  Date End Date Taking? Authorizing Provider  Ascorbic Acid (VITAMIN C PO) Take 1 tablet by mouth daily.    Yes Historical Provider, MD  aspirin 81 MG tablet Take 1 tablet (81 mg total) by mouth daily. 03/30/11  Yes Rolan Bucco, MD  bisacodyl (DULCOLAX) 10 MG suppository Place 1 suppository (10 mg total) rectally daily as needed for moderate constipation.  08/06/13  Yes Josetta Huddle, MD  Cholecalciferol (VITAMIN D3) 2000 UNITS TABS Take 2,000 mg by mouth daily.     Yes Historical Provider, MD  fish oil-omega-3 fatty acids 1000 MG capsule Take 1 capsule (1 g total) by mouth daily. 03/30/11  Yes Rolan Bucco, MD  HYDROcodone-acetaminophen (NORCO/VICODIN) 5-325 MG tablet Take 1-2 tablets by mouth every 4 (four) hours as needed for moderate pain. 02/05/16  Yes Mir Marry Guan, MD  insulin aspart (NOVOLOG FLEXPEN) 100 UNIT/ML FlexPen Inject 8 Units into the skin 3 (three) times daily with meals. 09/07/14  Yes Domenic Polite, MD  LEVEMIR FLEXTOUCH 100 UNIT/ML Pen Inject 18 Units into the skin daily.  01/28/16  Yes Historical Provider, MD  Multiple Vitamin (MULTIVITAMIN WITH MINERALS) TABS tablet Take 1 tablet by mouth daily.   Yes Historical Provider, MD  oxybutynin (DITROPAN) 5 MG tablet Take 5 mg by mouth 2 (two) times daily.    Yes Historical Provider, MD  simvastatin (ZOCOR) 20 MG tablet Take 20 mg by mouth every evening.   Yes Historical Provider, MD  collagenase (SANTYL) ointment Apply topically daily. 02/06/16   Mir Marry Guan, MD  protein supplement shake (PREMIER PROTEIN) LIQD Take 325 mLs (11 oz total) by mouth 2 (two) times daily between meals. Patient not taking: Reported on 02/14/2016 02/05/16 03/06/16  Mir Marry Guan, MD     Review of systems complete and found to be negative unless listed above in HPI     Physical exam Blood pressure (!) 169/99, pulse (!) 110, temperature 97.8 F (36.6 C), temperature source Oral, resp. rate (!) 24, height 6\' 4"  (1.93 m), weight  186 lb 4.6 oz (84.5 kg), SpO2 96 %. General: NAD Neck: No JVD, no thyromegaly or thyroid nodule.  Lungs: Clear to auscultation bilaterally with normal respiratory effort. CV: Nondisplaced PMI. Mildly tachycardic, regular rhythm, normal S1/S2, no S3/S4, no murmur.  No peripheral edema.  Abdomen: Soft, nontender, no distention.  Skin: Intact without lesions or rashes.  Neurologic: Alert and oriented x 3.  Psych: Normal affect. HEENT: Normal.   ECG: Most recent ECG reviewed.  Labs:   Lab Results  Component Value Date   WBC 11.1 (H) 02/15/2016   HGB 9.4 (L) 02/15/2016   HCT 27.3 (L) 02/15/2016   MCV 87.8 02/15/2016   PLT 194 02/15/2016    Recent Labs Lab 02/14/16 0020  02/15/16 0532  NA 137  < > 139  K 4.5  < > 5.6*  CL 112*  < > 110  CO2 17*  < > 18*  BUN 90*  < > 105*  CREATININE 3.30*  < > 3.71*  CALCIUM 8.4*  < > 8.9  PROT 7.0  --   --   BILITOT 0.8  --   --   ALKPHOS 68  --   --   ALT 15*  --   --   AST 18  --   --   GLUCOSE 143*  < > 181*  < > = values in this interval not displayed. Lab Results  Component Value Date   TROPONINI 0.23 ([place]) 02/15/2016   No results found for: CHOL No results found for: HDL No results found for: LDLCALC No results found for: TRIG No results found for: CHOLHDL No results found for: LDLDIRECT       Studies: Dg Chest 2 View  Result Date: 02/14/2016 CLINICAL DATA:  Acute onset of wheezing and shortness of breath. Initial encounter. EXAM: CHEST  2 VIEW COMPARISON:  Chest radiograph performed 08/30/2014 FINDINGS: Vascular congestion is noted, with bilateral central airspace opacification and small bilateral pleural effusions, compatible with pulmonary edema. No pneumothorax is seen. The cardiomediastinal silhouette is borderline normal in size. No acute osseous abnormalities are identified. IMPRESSION: Vascular congestion, with bilateral central airspace opacification and small bilateral pleural effusions, compatible with pulmonary  edema. Electronically Signed   By: Garald Balding M.D.   On: 02/14/2016 01:35   US Renal  Result Date: 02/14/2016 CLINICAL DATA:  Acute renal failure. EXAM: RENAL / URINARY TRACT ULTRASOUND COMPLETE COMPARISON:  Renal ultrasound of March 30, 2011. FINDINGS: Right Kidney: Length: 11.2 cm. Two simple cysts are seen in the upper pole, with the largest measuring 2.3 cm. Increased echogenicity of renal parenchyma is noted consistent with medical renal disease. No mass or hydronephrosis visualized. Left Kidney: Length: 10.7 cm. Increased echogenicity of renal parenchyma is noted consistent with medical renal disease. No mass or hydronephrosis visualized. Bladder: Decompressed secondary to Foley catheter. IMPRESSION: Simple right renal cyst. Increased echogenicity of renal parenchyma bilaterally is noted consistent with medical renal disease. Electronically Signed   By: Marijo Conception, M.D.   On: 02/14/2016 08:36   Mr Sacrum/si Joints Wo Contrast  Result Date: 02/14/2016 CLINICAL DATA:  Right ischial pressure ulcer since September of this year. Wound culture from 01/30/2016 grew MRSA. EXAM: MR SACRUM WITHOUT CONTRAST TECHNIQUE: Multiplanar, multisequence MR imaging was performed. No intravenous contrast was administered. COMPARISON:  MRI of the pelvis 09/01/2014. FINDINGS: There is partial visualization of marrow edema in the left parasymphyseal pubic bone, unchanged. No fracture is identified. A large ulceration over the right ischial tuberosity extends to bone. The wound appears to be packed. Mildly increased T2 signal and thickening are seen about the margins of the wound which could be due to infection or granulation tissue. A small focus of marrow edema is seen subjacent to the ulcer in the ischium compatible with osteomyelitis. No soft tissue abscess is identified. An ulceration is also seen over the left ischial tuberosity which may be healed. Marked edema is seen in subcutaneous soft tissues over the  right greater trochanter. Small focus of mild marrow edema is seen in the underlying greater trochanter. Bone marrow signal is normal in the sacrum. There is no SI joint effusion. Small amount of fluid is present in the hip joints bilaterally most compatible with physiologic change. No muscle or tendon tear is seen. Imaged intrapelvic contents demonstrated a Foley catheter in place. The urinary bladder is decompressed. Its walls appear markedly thickened. IMPRESSION: Large ulceration over the right ischial tuberosity extends to bone. Mild marrow edema is seen in the subjacent ischial tuberosity compatible with osteomyelitis. Subcutaneous edema over the right greater trochanter may be due to cellulitis. Small focus of mild marrow edema in the greater trochanter is worrisome for osteomyelitis. The urinary bladder is nearly completely decompressed but its walls appear markedly thickened which may be due to chronic bladder outlet obstruction and/or cystitis. Electronically Signed   By: Inge Rise M.D.   On: 02/14/2016 16:03    ASSESSMENT AND PLAN:  1. Acute systolic heart failure/cardiomyopathy: Given that there are no regional wall motion abnormalities, this may be nonischemic in etiology. ECG shows sinus tachycardia with nonspecific T wave abnormalities in high lateral leads. Troponin elevation is minimal and flat consistent with demand ischemia. Would not pursue coronary angiography given acute on chronic renal failure. Cannot use ACEI's/ARB's. Will start carvedilol 3.125 mg BID as he has hypertension as well.  Given  worsening renal failure, will stop scheduled IV Lasix. I agree with nephrology recommendations with respect to using it prn. He has put out another 200 cc in Foley bag which has not been recorded this morning.  2. New cardiomyopathy, EF 35-40%: Given that there are no regional wall motion abnormalities, this may be nonischemic in etiology. ECG shows sinus tachycardia with nonspecific T wave  abnormalities in high lateral leads. Troponin elevation is minimal and flat consistent with demand ischemia. Would not pursue coronary angiography given acute on chronic renal failure.  Continue ASA and statin. Would recommend outpatient nuclear stress test to assess for an ischemic etiology.  3. HTN: Will start Coreg 3.125 mg BID.  4. Acute on chronic renal failure: Given worsening renal failure, will stop scheduled IV Lasix. Additional recommendations as per nephrology.   Signed: Kate Sable, M.D., F.A.C.C.  02/15/2016, 8:58 AM

## 2016-02-15 NOTE — Clinical Social Work Note (Signed)
Clinical Social Work Assessment  Patient Details  Name: Lance [name] MRN: [id number] Date of Birth: August 05, 1940  Date of referral:  02/15/16               Reason for consult:  Facility Placement                Permission sought to share information with:  Family Supports Permission granted to share information::  No (Patient was asleep, CSW talked with patient's wife and son Corene Cornea at the bedside )Right outside of the room.  Name::     Jossue Rubenstein  Agency::     Relationship::  Wife  Contact Information:  [phone]  Housing/Transportation Living arrangements for the past 2 months:  [place], [place] (Patient admitted to [facility] on 02/05/16) Source of Information:  Spouse, Other (Comment Required) Tillie Rung, admissions staff at [facility]) Patient Interpreter Needed:  None Criminal Activity/Legal Involvement Pertinent to Current Situation/Hospitalization:  No - Comment as needed Significant Relationships:  Adult Children, Spouse Lives with:  Spouse Do you feel safe going back to the place where you live?  No (Wife and patient understand and agree that patient will need continued rehab before returning home) Need for family participation in patient care:  Yes (Comment)  Care giving concerns:  Wife expressed understanding that patient needs continued rehab and cannot be cared for at home at this time.   Social Worker assessment / plan: CSW talked to Mrs. Terhune and her son Corene Cornea right outside of the room as patient was asleep and wife wanted him to rest. Mrs. Macneal indicated that the plan is for patient to return to [facility], however patient was concerned about the facility taking him back as he may have to go to another hospital at some point for treatment (possibly outpatient treatment or very short-term inpatient treatment). Patient was also concerned about running out of days at the facility.   CSW contacted Tillie Rung at [facility] regarding patient's  concerns and confirmed that patient was admitted to their facility on 10/4 and that Mr. Blyden can return to their facility as long as he continues to need rehab. Per Tillie Rung, patient's last admission to [facility] was in 2014. Mrs. Baby updated on conversation with Helene Kelp and advised that if she has additional questions, the weekday CSW will be able to assist her. Wife thanked CSW for the information obtained from [place].    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  Patient/Family's Understanding of and Emotional Response to Diagnosis, Current Treatment, and Prognosis:  Wife expressed concerns regarding patient medical issues and care/treatment he will need. CSW listened empathetically, expressed understanding and offered support.  Emotional Assessment Appearance:  Appears stated age Attitude/Demeanor/Rapport:  Unable to Assess (Patient was asleep) Affect (typically observed):  Unable to Assess (Patient was asleep) Orientation:  Oriented to Self, Oriented to Place, Oriented to  Time, Oriented to Situation Alcohol / Substance use:  Tobacco Use (History of smoking, no record of alcohol or drug use) Psych involvement (Current and /or in the community):  No (Comment)  Discharge Needs  Concerns to be addressed:  Discharge Planning Concerns Readmission within the last 30 days:  Yes Current discharge risk:  None Barriers to Discharge:  No Barriers Identified  Sable Feil, LCSW 02/15/2016, 3:50 PM

## 2016-02-15 NOTE — Progress Notes (Signed)
[facility] Rounding Note  Subjective/Interval History:  Seen by cardiology this AM and felt his acute systolic HF possible non-ischemic. Carvedilol was started. Lasix was stopped. UOP past 24 hours was about a liter K up this AM but was getting KDur which has been stopped Creatinine continues to rise. Says feels just a little better but still no appetite, + nausea  Objective Vital signs in last 24 hours: Vitals:   02/14/16 1500 02/14/16 2134 02/15/16 0544 02/15/16 0840  BP: (!) 155/72 (!) 178/97 (!) 162/88 (!) 169/99  Pulse: 95 (!) 104 96 (!) 110  Resp: 20 (!) 24 20 (!) 24  Temp: 98.2 F (36.8 C) 98.3 F (36.8 C) 97.8 F (36.6 C)   TempSrc: Oral Oral Oral   SpO2: 94% 95% 96% 96%  Weight:   84.5 kg (186 lb 4.6 oz)   Height:       Weight change: 1.1 kg (2 lb 6.8 oz)  Intake/Output Summary (Last 24 hours) at 02/15/16 1321 Last data filed at 02/15/16 0605  Gross per 24 hour  Intake              445 ml  Output             1150 ml  Net             -705 ml   Physical Exam:  Blood pressure (!) 169/99, pulse (!) 110, temperature 97.8 F (36.6 C), temperature source Oral, resp. rate (!) 24, height 6\' 4"  (1.93 m), weight 84.5 kg (186 lb 4.6 oz), SpO2 96 %. Gen: Tall thin pleasant WM VS as noted RA sats 92-96% Pale  No JVD Lung fields are currently clear  Cardiac rhythm is regular.  S1S2 No S3.  Soft apical pansystolic murmur. No diastolic murmur or rub. Abdomen protuberant.  Bruising from insulin injection. No focal tenderness. Ext in sheepskin lined immobilizing splint devices - trace pretibial edema only Neuro: alert, Ox3, no focal deficit I did not turn him over to evaluate the ischial ulcer on the right  Recent Labs Lab 02/14/16 0020 02/14/16 1117 02/15/16 0532  NA 137 136 139  K 4.5 4.8 5.6*  CL 112* 108 110  CO2 17* 19* 18*  GLUCOSE 143* 250* 181*  BUN 90* 95* 105*  CREATININE 3.30* 3.51* 3.71*  CALCIUM 8.4* 8.6* 8.9  PHOS  --   --  6.2*     Recent Labs Lab  02/14/16 0020 02/15/16 0532  AST 18  --   ALT 15*  --   ALKPHOS 68  --   BILITOT 0.8  --   PROT 7.0  --   ALBUMIN 2.5* 2.6*     Recent Labs Lab 02/14/16 0020 02/14/16 1117 02/15/16 0532  WBC 14.2* 11.0* 11.1*  NEUTROABS 11.1*  --  8.1*  HGB 9.7* 9.8* 9.4*  HCT 28.2* 29.5* 27.3*  MCV 87.0 90.2 87.8  PLT 224 221 194     Recent Labs Lab 02/14/16 0020 02/14/16 1117 02/15/16 0532  TROPONINI 0.15* 0.19* 0.23*   CBG:  Recent Labs Lab 02/14/16 1154 02/14/16 1644 02/14/16 2136 02/15/16 0901 02/15/16 1149  GLUCAP 237* 169* 173* 177* 190*    Iron/TIBC/Ferritin/ %Sat    Component Value Date/Time   IRON 28 (L) 02/15/2016 0532   TIBC 193 (L) 02/15/2016 0532   FERRITIN 316 08/31/2014 0036   IRONPCTSAT 14 (L) 02/15/2016 0532   Studies/Results: Dg Chest 2 View  Result Date: 02/14/2016 CLINICAL DATA:  Acute onset of wheezing and shortness  of breath. Initial encounter. EXAM: CHEST  2 VIEW COMPARISON:  Chest radiograph performed 08/30/2014 FINDINGS: Vascular congestion is noted, with bilateral central airspace opacification and small bilateral pleural effusions, compatible with pulmonary edema. No pneumothorax is seen. The cardiomediastinal silhouette is borderline normal in size. No acute osseous abnormalities are identified. IMPRESSION: Vascular congestion, with bilateral central airspace opacification and small bilateral pleural effusions, compatible with pulmonary edema. Electronically Signed   By: Garald Balding M.D.   On: 02/14/2016 01:35   US Renal  Result Date: 02/14/2016 CLINICAL DATA:  Acute renal failure. EXAM: RENAL / URINARY TRACT ULTRASOUND COMPLETE COMPARISON:  Renal ultrasound of March 30, 2011. FINDINGS: Right Kidney: Length: 11.2 cm. Two simple cysts are seen in the upper pole, with the largest measuring 2.3 cm. Increased echogenicity of renal parenchyma is noted consistent with medical renal disease. No mass or hydronephrosis visualized. Left Kidney:  Length: 10.7 cm. Increased echogenicity of renal parenchyma is noted consistent with medical renal disease. No mass or hydronephrosis visualized. Bladder: Decompressed secondary to Foley catheter. IMPRESSION: Simple right renal cyst. Increased echogenicity of renal parenchyma bilaterally is noted consistent with medical renal disease. Electronically Signed   By: Marijo Conception, M.D.   On: 02/14/2016 08:36   Mr Sacrum/si Joints Wo Contrast  Result Date: 02/14/2016 CLINICAL DATA:  Right ischial pressure ulcer since September of this year. Wound culture from 01/30/2016 grew MRSA. EXAM: MR SACRUM WITHOUT CONTRAST TECHNIQUE: Multiplanar, multisequence MR imaging was performed. No intravenous contrast was administered. COMPARISON:  MRI of the pelvis 09/01/2014. FINDINGS: There is partial visualization of marrow edema in the left parasymphyseal pubic bone, unchanged. No fracture is identified. A large ulceration over the right ischial tuberosity extends to bone. The wound appears to be packed. Mildly increased T2 signal and thickening are seen about the margins of the wound which could be due to infection or granulation tissue. A small focus of marrow edema is seen subjacent to the ulcer in the ischium compatible with osteomyelitis. No soft tissue abscess is identified. An ulceration is also seen over the left ischial tuberosity which may be healed. Marked edema is seen in subcutaneous soft tissues over the right greater trochanter. Small focus of mild marrow edema is seen in the underlying greater trochanter. Bone marrow signal is normal in the sacrum. There is no SI joint effusion. Small amount of fluid is present in the hip joints bilaterally most compatible with physiologic change. No muscle or tendon tear is seen. Imaged intrapelvic contents demonstrated a Foley catheter in place. The urinary bladder is decompressed. Its walls appear markedly thickened. IMPRESSION: Large ulceration over the right ischial  tuberosity extends to bone. Mild marrow edema is seen in the subjacent ischial tuberosity compatible with osteomyelitis. Subcutaneous edema over the right greater trochanter may be due to cellulitis. Small focus of mild marrow edema in the greater trochanter is worrisome for osteomyelitis. The urinary bladder is nearly completely decompressed but its walls appear markedly thickened which may be due to chronic bladder outlet obstruction and/or cystitis. Electronically Signed   By: Inge Rise M.D.   On: 02/14/2016 16:03   Medications:   . aspirin EC  81 mg Oral Daily  . carvedilol  3.125 mg Oral BID WC  . chlorhexidine  15 mL Mouth Rinse BID  . collagenase   Topical Daily  . enoxaparin (LOVENOX) injection  30 mg Subcutaneous Q24H  . insulin aspart  0-5 Units Subcutaneous QHS  . insulin aspart  0-9 Units Subcutaneous TID WC  .  insulin detemir  10 Units Subcutaneous Daily  . mouth rinse  15 mL Mouth Rinse q12n4p  . multivitamin with minerals  1 tablet Oral Daily  . oxybutynin  5 mg Oral BID  . protein supplement shake  11 oz Oral BID BM  . simvastatin  20 mg Oral QPM  . sodium polystyrene  30 g Oral Once    Background:  75 y.o. year-old with PMH significant for DM2, HTN, multiple sclerosis, spastic quadriplegia, urinary retention, h/o decubitus/ischial ulcers, baseline CKD. Recently hospitalized (9/29-10/4) with R ischial pressure ulcer, that was debrided, treated with 6 days of vanco and zosyn (for this plus a klebsiella and strep UTI) Discharged  10/4 (with a creatinine of 1.82) to finish 4 more days of bactrim. Was brought back to the hospital from  [facility] (there for rehab) with new onset dyspnea, and hypoxemia. CXR in the ED showed pulmonary edema, creatinine was noted to be elevated at 3.3 and he was admitted. BNP elevated >900, troponins elevated.   ECHO shows LV EF 35% - 40% (down from 50-55% 09/2014), moderate diffuse hypokinesis, mild to moderate MR and Grade 1 diastolic  dysfunction. We were asked to see re his AKI on CKD.  Assessment/Recommendations  AKI on CKD3             Baseline creatinine 1.8-1.9             >50% reduction in GFR since discharge 10/4 and creatinine continues to rise (GFR probably lower than projected given low muscle mass from MS)             COULD have a cardiorenal component, although his cardiopulm sx were quite acute - no chronic HF             CXR looked wet on admission but only minimal peripheral edema             Receive 6 days of vanco last admission (No creatinines since discharge - and as we know  vanco toxicity can appear rather suddenly and dramatically)             Continuing foley drainage with known urinary retention problems  Prn use of lasix   Currently UPC suggests nephrotic range proteinuria (5.36 gm)             Diabetic component likely but I cannot exclude other GN (including post infectious) - blood and protein on UA but urine looks infected so hard to interpret) - see above  Will order serologic studies as I can not clearly explain progressive loss of renal function  If loss of kidney function continues next 48 hours will require transfer to [facility] for dialysis and a renal biopsy (all of this discussed with patient, sons and wife and he is agreeable to the plan)  Hyperkalemia    D/t renal failure plus BID KDur (I failed to not that on his med list yesterday or I would have stopped)  S/p kayexalate  CKD3             Baseline creatinine 1.8-1.9              Dipstick proteinuria present as far back as 2012              Acute onset dyspnea with hypoxia             CXR w/pulm edema, BNP elevated, trops +. ECHO with reduction in EF since 2016  Acute systolic CHF possibly nonischemic per cardiology              Currently sats OK on room air, little in the way of peripheral edema and has had no past diuretic requirement   Lasix has been stopped by cardiology after modest (1 liter) response yesterday and  carvedilol added            Anemia              Required transfusion last admission              Currently Fe deficient  Not on ATB's so will treat with Feraheme  R ischial ulcer             Debrided last adm and treated with Vanco and Zosyn             Hoping for skin flap by Wound Team at [facility] in November             MRI ordered to r/o osteo - suggestive  Presume ID will be involved   IDDM             Per primary   Jamal Maes, MD [place] pager 02/15/2016, 1:21 PM

## 2016-02-16 DIAGNOSIS — Z7982 Long term (current) use of aspirin: Secondary | ICD-10-CM

## 2016-02-16 DIAGNOSIS — G041 Tropical spastic paraplegia: Secondary | ICD-10-CM

## 2016-02-16 DIAGNOSIS — N179 Acute kidney failure, unspecified: Secondary | ICD-10-CM

## 2016-02-16 DIAGNOSIS — E1122 Type 2 diabetes mellitus with diabetic chronic kidney disease: Secondary | ICD-10-CM

## 2016-02-16 DIAGNOSIS — Z79899 Other long term (current) drug therapy: Secondary | ICD-10-CM

## 2016-02-16 DIAGNOSIS — N183 Chronic kidney disease, stage 3 (moderate): Secondary | ICD-10-CM

## 2016-02-16 DIAGNOSIS — M868X8 Other osteomyelitis, other site: Secondary | ICD-10-CM

## 2016-02-16 DIAGNOSIS — I1310 Hypertensive heart and chronic kidney disease without heart failure, with stage 1 through stage 4 chronic kidney disease, or unspecified chronic kidney disease: Secondary | ICD-10-CM

## 2016-02-16 DIAGNOSIS — I429 Cardiomyopathy, unspecified: Secondary | ICD-10-CM

## 2016-02-16 DIAGNOSIS — J810 Acute pulmonary edema: Secondary | ICD-10-CM | POA: Diagnosis present

## 2016-02-16 DIAGNOSIS — N2889 Other specified disorders of kidney and ureter: Secondary | ICD-10-CM

## 2016-02-16 DIAGNOSIS — B9689 Other specified bacterial agents as the cause of diseases classified elsewhere: Secondary | ICD-10-CM

## 2016-02-16 DIAGNOSIS — G35 Multiple sclerosis: Secondary | ICD-10-CM

## 2016-02-16 DIAGNOSIS — I151 Hypertension secondary to other renal disorders: Secondary | ICD-10-CM

## 2016-02-16 DIAGNOSIS — Z794 Long term (current) use of insulin: Secondary | ICD-10-CM

## 2016-02-16 LAB — RENAL FUNCTION PANEL
ALBUMIN: 2.5 g/dL — AB (ref 3.5–5.0)
ANION GAP: 10 (ref 5–15)
BUN: 105 mg/dL — AB (ref 6–20)
CHLORIDE: 108 mmol/L (ref 101–111)
CO2: 19 mmol/L — ABNORMAL LOW (ref 22–32)
Calcium: 8.4 mg/dL — ABNORMAL LOW (ref 8.9–10.3)
Creatinine, Ser: 4.02 mg/dL — ABNORMAL HIGH (ref 0.61–1.24)
GFR, EST AFRICAN AMERICAN: 16 mL/min — AB (ref >60)
GFR, EST NON AFRICAN AMERICAN: 13 mL/min — AB (ref >60)
Glucose, Bld: 183 mg/dL — ABNORMAL HIGH (ref 65–99)
PHOSPHORUS: 7.4 mg/dL — AB (ref 2.5–4.6)
POTASSIUM: 5.1 mmol/L (ref 3.5–5.1)
Sodium: 137 mmol/L (ref 135–145)

## 2016-02-16 LAB — GLUCOSE, CAPILLARY
GLUCOSE-CAPILLARY: 186 mg/dL — AB (ref 65–99)
GLUCOSE-CAPILLARY: 137 mg/dL — AB (ref 65–99)
GLUCOSE-CAPILLARY: 154 mg/dL — AB (ref 65–99)
Glucose-Capillary: 212 mg/dL — ABNORMAL HIGH (ref 65–99)

## 2016-02-16 LAB — C3 COMPLEMENT: C3 Complement: 111 mg/dL (ref 82–167)

## 2016-02-16 LAB — C4 COMPLEMENT: Complement C4, Body Fluid: 25 mg/dL (ref 14–44)

## 2016-02-16 LAB — ANTISTREPTOLYSIN O TITER: ASO: 376 IU/mL — ABNORMAL HIGH (ref 0.0–200.0)

## 2016-02-16 MED ORDER — FUROSEMIDE 10 MG/ML IJ SOLN
80.0000 mg | Freq: Once | INTRAMUSCULAR | Status: AC
  Administered 2016-02-16: 80 mg via INTRAVENOUS
  Filled 2016-02-16: qty 8

## 2016-02-16 NOTE — Progress Notes (Signed)
PROGRESS NOTE    Lance [name]  DQQ:[id number]  [name]: October 21, 1940  DOA: 02/13/2016 PCP: Lance Cobble, MD  Hospital course: The patient is a 75 y.o. year-old with PMH significant for DM2, HTN, multiple sclerosis, spastic quadriplegia, urinary retention, h/o decubitus ulcers/ischial osteo Left (2016 with MRSA bacteremia treated with 7 weeks of vancomycin).  He was recently hospitalized (9/29-10/4) because of a wound associated with a R ischial pressure ulcer, that was debrided at bedside, treated with 6 days of vanco and zosyn ( for this plus a klebsiella and strep UTI) He was discharged on 10/4 (with a creatinine of 1.82.) to finish 4 more days of bactrim.  He was brought back to the hospital from [facility] with new onset dyspnea, and hypoxemia. CXR in the ED showed pulmonary edema, creatinine was noted to be elevated at 3.3 and he was admitted. BNP elevated >900, troponins elevated.  Assessment & Plan:   1. Cardiorenal syndrome on CKD III:  Hyperkalemia noted today, ordered kayexalate x 1, lasix given, recheck in AM. Baseline Cr 1.7.  Baseline EF 50%, no previous CHF flare.  Unclear precipitating factor at present (no recent NSAIDs, no known hypotension at facility, no recent contrast exposure).  No casts on UA, mild pyuria, recent antibiotics with Bactrim but suspect most likely congestive nephropathy/cardiorenal syndrome.  Pt admitted initially for diuresis with IV lasix now off lasix with worsening renal function, He may need dialysis as he is getting more SOB and now on oxygen.  Renal function worsening, nephrology team consulted. Appreciate Dr Sanda Klein assistance and multiple labs pending.  May need transfer to [facility] for hemodialysis and renal biopsy 10/16 if not improving.     2. MS with spastic quadruplegia: Air overlay mattress, frequent turning  3. Anemia: nephrology team starting aranesp 10/15, received feraheme 10/14.   4. Leukocytosis:  Likely secondary to right  ischial sacral wound infection with osteomyelitis.  Ulcer not festering, but noted that CRP/ESR elevated during last hospitalization (and he had osteomyelitis of the pelvis >1 year ago in the setting of TEE- MRSA bacteremia for which he had 7 weeks of IV vancomycin with ID, Dr. Johnnye Sima)  - He is now on daptomycin for treatment of his osteomyelitis.    5. IDDM:  -Continue Levemir, lower dose given renal failure -SSI with meals  CBG (last 3)   Recent Labs  02/15/16 2151 02/16/16 0854 02/16/16 1145  GLUCAP 179* 212* 186*   6. HTN:  Currently hypertensive in the setting of fluid overload. -Continue aspirin and statin, coreg  7. Recurrent UTI with indwelling foley:  Foley last changed before last course of antibiotics. -Continue oxybutynin  8. Severe protein calorie malnutrition: Continue to encourage supplements, consulted dietitian  9. Right Ischial Osteomyelitis;  MRI sacrum positive for osteomyelitis, ID consulted, started daptomycin 10/14 per ID Baxter Flattery) will need 6 weeks of therapy : needs weekly CK tests while on daptomycin -Consult WOC, Pt is followed by plastic surgery at [facility] and s/p skin flap surgery   DVT prophylaxis: Lovenox  Code Status: FULL  Family Communication: None present  Disposition Plan: TBD Consults called: None overnight Admission status: INPATIENT, telemetry  Consultants:  Nephrology  Cardiology  ID  Procedures:  MRI sacrum  Subjective: Pt became more SOB overnight and now on oxygen  Objective: Vitals:   02/15/16 0840 02/15/16 1500 02/15/16 2008 02/16/16 0432  BP: (!) 169/99 (!) 141/99 (!) 147/91 (!) 142/85  Pulse: (!) 110 92 (!) 109 (!) 106  Resp: Marland Kitchen)  24 20 (!) 24 20  Temp:  97.9 F (36.6 C) 98.5 F (36.9 C) 97.9 F (36.6 C)  TempSrc:  Oral Oral Oral  SpO2: 96% 95% (!) 86% 93%  Weight:    85 kg (187 lb 6.3 oz)  Height:        Intake/Output Summary (Last 24 hours) at 02/16/16 1313 Last data filed at  02/16/16 0432  Gross per 24 hour  Intake           230.52 ml  Output              550 ml  Net          -319.48 ml   Filed Weights   02/14/16 0355 02/15/16 0544 02/16/16 0432  Weight: 83.4 kg (183 lb 13.8 oz) 84.5 kg (186 lb 4.6 oz) 85 kg (187 lb 6.3 oz)    Exam:  General exam:  Frail debilitated elderly male, chronically ill appearing, alert and in no acute distress. Respiratory system: scattered crackles heard. No increased work of breathing. Cardiovascular system: S1 & S2 heard, RRR. No JVD, murmurs, gallops, clicks or pedal edema. Gastrointestinal system: Abdomen is nondistended, soft and nontender. Normal bowel sounds heard. Central nervous system: Alert and oriented. No focal neurological deficits. Extremities: no CCE. Skin: large right ischial stage 4 ulcer to bone  Data Reviewed: Basic Metabolic Panel:  Recent Labs Lab 02/14/16 0020 02/14/16 1117 02/15/16 0532 02/16/16 0532  NA 137 136 139 137  K 4.5 4.8 5.6* 5.1  CL 112* 108 110 108  CO2 17* 19* 18* 19*  GLUCOSE 143* 250* 181* 183*  BUN 90* 95* 105* 105*  CREATININE 3.30* 3.51* 3.71* 4.02*  CALCIUM 8.4* 8.6* 8.9 8.4*  PHOS  --   --  6.2* 7.4*   Liver Function Tests:  Recent Labs Lab 02/14/16 0020 02/15/16 0532 02/16/16 0532  AST 18  --   --   ALT 15*  --   --   ALKPHOS 68  --   --   BILITOT 0.8  --   --   PROT 7.0  --   --   ALBUMIN 2.5* 2.6* 2.5*   No results for input(s): LIPASE, AMYLASE in the last 168 hours. No results for input(s): AMMONIA in the last 168 hours. CBC:  Recent Labs Lab 02/14/16 0020 02/14/16 1117 02/15/16 0532  WBC 14.2* 11.0* 11.1*  NEUTROABS 11.1*  --  8.1*  HGB 9.7* 9.8* 9.4*  HCT 28.2* 29.5* 27.3*  MCV 87.0 90.2 87.8  PLT 224 221 194   Cardiac Enzymes:  Recent Labs Lab 02/14/16 0020 02/14/16 1117 02/15/16 0532 02/15/16 1428  CKTOTAL  --   --   --  33*  TROPONINI 0.15* 0.19* 0.23*  --    CBG (last 3)   Recent Labs  02/15/16 2151 02/16/16 0854  02/16/16 1145  GLUCAP 179* 212* 186*   No results found for this or any previous visit (from the past 240 hour(s)).   Studies: Mr Sacrum/si Joints Wo Contrast  Result Date: 02/14/2016 CLINICAL DATA:  Right ischial pressure ulcer since September of this year. Wound culture from 01/30/2016 grew MRSA. EXAM: MR SACRUM WITHOUT CONTRAST TECHNIQUE: Multiplanar, multisequence MR imaging was performed. No intravenous contrast was administered. COMPARISON:  MRI of the pelvis 09/01/2014. FINDINGS: There is partial visualization of marrow edema in the left parasymphyseal pubic bone, unchanged. No fracture is identified. A large ulceration over the right ischial tuberosity extends to bone. The wound appears to be packed.  Mildly increased T2 signal and thickening are seen about the margins of the wound which could be due to infection or granulation tissue. A small focus of marrow edema is seen subjacent to the ulcer in the ischium compatible with osteomyelitis. No soft tissue abscess is identified. An ulceration is also seen over the left ischial tuberosity which may be healed. Marked edema is seen in subcutaneous soft tissues over the right greater trochanter. Small focus of mild marrow edema is seen in the underlying greater trochanter. Bone marrow signal is normal in the sacrum. There is no SI joint effusion. Small amount of fluid is present in the hip joints bilaterally most compatible with physiologic change. No muscle or tendon tear is seen. Imaged intrapelvic contents demonstrated a Foley catheter in place. The urinary bladder is decompressed. Its walls appear markedly thickened. IMPRESSION: Large ulceration over the right ischial tuberosity extends to bone. Mild marrow edema is seen in the subjacent ischial tuberosity compatible with osteomyelitis. Subcutaneous edema over the right greater trochanter may be due to cellulitis. Small focus of mild marrow edema in the greater trochanter is worrisome for  osteomyelitis. The urinary bladder is nearly completely decompressed but its walls appear markedly thickened which may be due to chronic bladder outlet obstruction and/or cystitis. Electronically Signed   By: Inge Rise M.D.   On: 02/14/2016 16:03   Scheduled Meds: . aspirin EC  81 mg Oral Daily  . carvedilol  3.125 mg Oral BID WC  . chlorhexidine  15 mL Mouth Rinse BID  . collagenase   Topical Daily  . DAPTOmycin (CUBICIN)  IV  8 mg/kg Intravenous Q48H  . enoxaparin (LOVENOX) injection  30 mg Subcutaneous Q24H  . ferumoxytol  510 mg Intravenous Weekly  . insulin aspart  0-5 Units Subcutaneous QHS  . insulin aspart  0-9 Units Subcutaneous TID WC  . insulin detemir  10 Units Subcutaneous Daily  . mouth rinse  15 mL Mouth Rinse q12n4p  . multivitamin with minerals  1 tablet Oral Daily  . oxybutynin  5 mg Oral BID  . protein supplement shake  11 oz Oral BID BM   Continuous Infusions:   Principal Problem:   Acute CHF (congestive heart failure) (HCC) Active Problems:   Multiple sclerosis (HCC)   Sacral decubitus ulcer   Normocytic anemia   Spastic quadriplegia (HCC)   Chronic kidney disease, stage 3   Essential hypertension, benign   DM (diabetes mellitus) type II controlled with renal manifestation (HCC)   Recurrent UTI (urinary tract infection)   AKI (acute kidney injury) ([place])   Elevated troponin   Severe protein-calorie malnutrition ([place])  Time spent:   Irwin Brakeman, MD, FAAFP [facility] Pager [phone]  If 7PM-7AM, please contact night-coverage [email or web address] Password [email or web address] 02/16/2016, 1:13 PM    LOS: 2 days

## 2016-02-16 NOTE — Progress Notes (Addendum)
[facility] Rounding Note Subjective/Interval History:   UOP only about 500 cc/24 hours Feeling more SOB Now wearing O2  New Dx R ischial osteo To get 6 weeks of daptomycin Multiple pending tests re his renal failure  Objective Vital signs in last 24 hours: Vitals:   02/15/16 0840 02/15/16 1500 02/15/16 2008 02/16/16 0432  BP: (!) 169/99 (!) 141/99 (!) 147/91 (!) 142/85  Pulse: (!) 110 92 (!) 109 (!) 106  Resp: (!) 24 20 (!) 24 20  Temp:  97.9 F (36.6 C) 98.5 F (36.9 C) 97.9 F (36.6 C)  TempSrc:  Oral Oral Oral  SpO2: 96% 95% (!) 86% 93%  Weight:    85 kg (187 lb 6.3 oz)  Height:       Weight change: 0.5 kg (1 lb 1.6 oz)  Intake/Output Summary (Last 24 hours) at 02/16/16 1114 Last data filed at 02/16/16 0432  Gross per 24 hour  Intake           230.52 ml  Output              550 ml  Net          -319.48 ml   Physical Exam:  Blood pressure (!) 142/85, pulse (!) 106, temperature 97.9 F (36.6 C), temperature source Oral, resp. rate 20, height 6\' 4"  (1.93 m), weight 85 kg (187 lb 6.3 oz), SpO2 93 %. Gen: Tall thin WM VS as noted RA sats 92-96% (dropped to 86% last PM) Pale  No JVD Crackles both lung bases Cardiac rhythm is regular.  S1S2 No S3.  Soft apical pansystolic murmur.  No diastolic murmur or rub. Abdomen protuberant.  Bruising from insulin injection. No focal tenderness. Ext in sheepskin lined immobilizing splint devices - trace pretibial edema only Neuro: alert, Ox3, no focal deficit   Recent Labs Lab 02/14/16 0020 02/14/16 1117 02/15/16 0532 02/16/16 0532  NA 137 136 139 137  K 4.5 4.8 5.6* 5.1  CL 112* 108 110 108  CO2 17* 19* 18* 19*  GLUCOSE 143* 250* 181* 183*  BUN 90* 95* 105* 105*  CREATININE 3.30* 3.51* 3.71* 4.02*  CALCIUM 8.4* 8.6* 8.9 8.4*  PHOS  --   --  6.2* 7.4*     Recent Labs Lab 02/14/16 0020 02/15/16 0532 02/16/16 0532  AST 18  --   --   ALT 15*  --   --   ALKPHOS 68  --   --   BILITOT 0.8  --   --   PROT 7.0  --    --   ALBUMIN 2.5* 2.6* 2.5*     Recent Labs Lab 02/14/16 0020 02/14/16 1117 02/15/16 0532  WBC 14.2* 11.0* 11.1*  NEUTROABS 11.1*  --  8.1*  HGB 9.7* 9.8* 9.4*  HCT 28.2* 29.5* 27.3*  MCV 87.0 90.2 87.8  PLT 224 221 194     Recent Labs Lab 02/14/16 0020 02/14/16 1117 02/15/16 0532 02/15/16 1428  CKTOTAL  --   --   --  33*  TROPONINI 0.15* 0.19* 0.23*  --    CBG:  Recent Labs Lab 02/15/16 0901 02/15/16 1149 02/15/16 1658 02/15/16 2151 02/16/16 0854  GLUCAP 177* 190* 152* 179* 212*    Iron/TIBC/Ferritin/ %Sat    Component Value Date/Time   IRON 28 (L) 02/15/2016 0532   TIBC 193 (L) 02/15/2016 0532   FERRITIN 316 08/31/2014 0036   IRONPCTSAT 14 (L) 02/15/2016 0532   Studies/Results: Mr Sacrum/si Joints Wo Contrast  Result Date: 02/14/2016  CLINICAL DATA:  Right ischial pressure ulcer since September of this year. Wound culture from 01/30/2016 grew MRSA. EXAM: MR SACRUM WITHOUT CONTRAST TECHNIQUE: Multiplanar, multisequence MR imaging was performed. No intravenous contrast was administered. COMPARISON:  MRI of the pelvis 09/01/2014. FINDINGS: There is partial visualization of marrow edema in the left parasymphyseal pubic bone, unchanged. No fracture is identified. A large ulceration over the right ischial tuberosity extends to bone. The wound appears to be packed. Mildly increased T2 signal and thickening are seen about the margins of the wound which could be due to infection or granulation tissue. A small focus of marrow edema is seen subjacent to the ulcer in the ischium compatible with osteomyelitis. No soft tissue abscess is identified. An ulceration is also seen over the left ischial tuberosity which may be healed. Marked edema is seen in subcutaneous soft tissues over the right greater trochanter. Small focus of mild marrow edema is seen in the underlying greater trochanter. Bone marrow signal is normal in the sacrum. There is no SI joint effusion. Small amount of  fluid is present in the hip joints bilaterally most compatible with physiologic change. No muscle or tendon tear is seen. Imaged intrapelvic contents demonstrated a Foley catheter in place. The urinary bladder is decompressed. Its walls appear markedly thickened. IMPRESSION: Large ulceration over the right ischial tuberosity extends to bone. Mild marrow edema is seen in the subjacent ischial tuberosity compatible with osteomyelitis. Subcutaneous edema over the right greater trochanter may be due to cellulitis. Small focus of mild marrow edema in the greater trochanter is worrisome for osteomyelitis. The urinary bladder is nearly completely decompressed but its walls appear markedly thickened which may be due to chronic bladder outlet obstruction and/or cystitis. Electronically Signed   By: Inge Rise M.D.   On: 02/14/2016 16:03   Medications:   . aspirin EC  81 mg Oral Daily  . carvedilol  3.125 mg Oral BID WC  . chlorhexidine  15 mL Mouth Rinse BID  . collagenase   Topical Daily  . DAPTOmycin (CUBICIN)  IV  8 mg/kg Intravenous Q48H  . enoxaparin (LOVENOX) injection  30 mg Subcutaneous Q24H  . ferumoxytol  510 mg Intravenous Weekly  . insulin aspart  0-5 Units Subcutaneous QHS  . insulin aspart  0-9 Units Subcutaneous TID WC  . insulin detemir  10 Units Subcutaneous Daily  . mouth rinse  15 mL Mouth Rinse q12n4p  . multivitamin with minerals  1 tablet Oral Daily  . oxybutynin  5 mg Oral BID  . protein supplement shake  11 oz Oral BID BM    Background:  75 y.o. year-old with PMH significant for DM2, HTN, multiple sclerosis, spastic quadriplegia, urinary retention, h/o decubitus/ischial ulcers, baseline CKD. Recently hospitalized (9/29-10/4) with R ischial pressure ulcer, that was debrided, treated with 6 days of vanco and zosyn (for this plus a klebsiella and strep UTI) Discharged  10/4 (with a creatinine of 1.82) to finish 4 more days of bactrim. Was brought back to the hospital from   [facility] (there for rehab) with new onset dyspnea, and hypoxemia. CXR in the ED showed pulmonary edema, creatinine was noted to be elevated at 3.3 and he was admitted. BNP elevated >900, troponins elevated.   ECHO LV EF 35% - 40% (down from 50-55% 09/2014), moderate diffuse hypokinesis, mild to moderate MR and Grade 1 diastolic dysfunction. We were asked to see re his AKI on CKD.  Assessment/Recommendations  AKI on CKD3 - etiology not clear. I do  not think this is a primarily a cardiorenal issue - progressive renal failure disproportionate to his heart failure             Baseline creatinine 1.8-1.9             >50% reduction in GFR since discharge 10/4 and creatinine continues to rise (GFR probably lower than projected given low muscle mass from MS)  I think nausea may be uremic sx for him             COULD have a cardiorenal component, although his cardiopulm sx were quite acute - no chronic HF -I don't think that is the major issue             CXR looked wet on admission but only minimal peripheral edema             Receive 6 days of vanco last admission (No creatinines since discharge - and as we know  vanco toxicity can appear rather suddenly and dramatically)             Continuing foley drainage with known urinary retention problems  Prn use of lasix   Currently UPC suggests nephrotic range proteinuria (5.36 gm)             Diabetic component likely but cannot exclude other GN (including post infectious) - blood and protein on UA but urine looks infected so hard to interpret) - see above  Have ordered serologic studies as I can not clearly explain progressive loss of renal function (SPEP, UPEP, ANA,  ANCA, C3, C4, antiGBM all pending)  If loss of kidney function continues next 24 hours will require transfer to [facility] for dialysis and a renal biopsy (all of this discussed with patient, sons and wife and he is agreeable to the plan)  UOP marginal last 24 hours/more SOB/crackles on exam - give  dose of lasix 80 now  Hyperkalemia    D/t renal failure plus BID KDur which was stopped  S/p kayexalate  Improved  Renal diet  CKD3             Baseline creatinine 1.8-1.9              Dipstick proteinuria present as far back as 2012              Acute onset dyspnea with hypoxia/systolic CHF             CXR w/pulm edema, BNP elevated, trops +. ECHO with reduction in EF since 2016             Acute systolic CHF possibly nonischemic per cardiology              Had drop in sats last PM  UOP only about 500 cc/24 hours  Give 80 mg dose lasix today            Anemia              Required transfusion last admission              Currently Fe deficient   Rec'd Feraheme 10/14.   Start Aranesp  R ischial ulcer with osteomyelitis by MRI   ID starting daptomycin   IDDM             [place] [id number] [facility] [phone] pager 02/16/2016, 11:14 AM

## 2016-02-16 NOTE — Progress Notes (Signed)
Pt was sitting up in bed and awake when [employer] arrived. CH responded to page for AD. Pt had company (outside of family) present in addition to his son and wife. [place] offered to return to visit and pt accepted. Please page when assistance is needed. [place], M.Div.   02/15/16 1605  Clinical Encounter Type  Visited With Patient and family together

## 2016-02-16 NOTE — Progress Notes (Signed)
Patient Name: Lance [name] Date of Encounter: 02/16/2016  Primary Cardiologist: [facility] Problem List     Principal Problem:   Acute CHF (congestive heart failure) (HCC) Active Problems:   Multiple sclerosis (HCC)   Sacral decubitus ulcer   Normocytic anemia   Spastic quadriplegia (HCC)   Chronic kidney disease, stage 3   Essential hypertension, benign   DM (diabetes mellitus) type II controlled with renal manifestation (HCC)   Recurrent UTI (urinary tract infection)   AKI (acute kidney injury) (HCC)   Elevated troponin   Severe protein-calorie malnutrition (HCC)     Subjective   Denies chest pain and SOB. Had been SOB last night, improved after 2L O2.  Inpatient Medications    Scheduled Meds: . aspirin EC  81 mg Oral Daily  . carvedilol  3.125 mg Oral BID WC  . chlorhexidine  15 mL Mouth Rinse BID  . collagenase   Topical Daily  . DAPTOmycin (CUBICIN)  IV  8 mg/kg Intravenous Q48H  . enoxaparin (LOVENOX) injection  30 mg Subcutaneous Q24H  . ferumoxytol  510 mg Intravenous Weekly  . insulin aspart  0-5 Units Subcutaneous QHS  . insulin aspart  0-9 Units Subcutaneous TID WC  . insulin detemir  10 Units Subcutaneous Daily  . mouth rinse  15 mL Mouth Rinse q12n4p  . multivitamin with minerals  1 tablet Oral Daily  . oxybutynin  5 mg Oral BID  . protein supplement shake  11 oz Oral BID BM   Continuous Infusions:     Vital Signs    Vitals:   02/15/16 0840 02/15/16 1500 02/15/16 2008 02/16/16 0432  BP: (!) 169/99 (!) 141/99 (!) 147/91 (!) 142/85  Pulse: (!) 110 92 (!) 109 (!) 106  Resp: (!) 24 20 (!) 24 20  Temp:  97.9 F (36.6 C) 98.5 F (36.9 C) 97.9 F (36.6 C)  TempSrc:  Oral Oral Oral  SpO2: 96% 95% (!) 86% 93%  Weight:    187 lb 6.3 oz (85 kg)  Height:        Intake/Output Summary (Last 24 hours) at 02/16/16 0830 Last data filed at 02/16/16 0432  Gross per 24 hour  Intake           230.52 ml  Output              550 ml  Net           -319.48 ml   Filed Weights   02/14/16 0355 02/15/16 0544 02/16/16 0432  Weight: 183 lb 13.8 oz (83.4 kg) 186 lb 4.6 oz (84.5 kg) 187 lb 6.3 oz (85 kg)    Physical Exam  General: NAD Neck: No JVD, no thyromegaly or thyroid nodule.  Lungs: Clear to auscultation bilaterally with normal respiratory effort. CV: Nondisplaced PMI. Mildly tachycardic, regular rhythm, normal S1/S2, no S3/S4, no murmur.  No peripheral edema.  Abdomen: Soft, nontender, no distention.  Skin: Intact without lesions or rashes.  Neurologic: Alert and oriented x 3.  Psych: Normal affect. HEENT: Normal.   Labs    CBC  Recent Labs  02/14/16 0020 02/14/16 1117 02/15/16 0532  WBC 14.2* 11.0* 11.1*  NEUTROABS 11.1*  --  8.1*  HGB 9.7* 9.8* 9.4*  HCT 28.2* 29.5* 27.3*  MCV 87.0 90.2 87.8  PLT 224 221 505   Basic Metabolic Panel  Recent Labs  02/15/16 0532 02/16/16 0532  NA 139 137  K 5.6* 5.1  CL 110 108  CO2 18*  19*  GLUCOSE 181* 183*  BUN 105* 105*  CREATININE 3.71* 4.02*  CALCIUM 8.9 8.4*  PHOS 6.2* 7.4*   Liver Function Tests  Recent Labs  02/14/16 0020 02/15/16 0532 02/16/16 0532  AST 18  --   --   ALT 15*  --   --   ALKPHOS 68  --   --   BILITOT 0.8  --   --   PROT 7.0  --   --   ALBUMIN 2.5* 2.6* 2.5*   No results for input(s): LIPASE, AMYLASE in the last 72 hours. Cardiac Enzymes  Recent Labs  02/14/16 0020 02/14/16 1117 02/15/16 0532 02/15/16 1428  CKTOTAL  --   --   --  33*  TROPONINI 0.15* 0.19* 0.23*  --    BNP Invalid input(s): POCBNP D-Dimer No results for input(s): DDIMER in the last 72 hours. Hemoglobin A1C  Recent Labs  02/14/16 1117  HGBA1C 6.0*   Fasting Lipid Panel No results for input(s): CHOL, HDL, LDLCALC, TRIG, CHOLHDL, LDLDIRECT in the last 72 hours. Thyroid Function Tests No results for input(s): TSH, T4TOTAL, T3FREE, THYROIDAB in the last 72 hours.  Invalid input(s): FREET3  Telemetry     - Personally Reviewed  ECG    -  Personally Reviewed  Radiology    Mr [facility] Joints Wo Contrast  Result Date: 02/14/2016 CLINICAL DATA:  Right ischial pressure ulcer since September of this year. Wound culture from 01/30/2016 grew MRSA. EXAM: MR SACRUM WITHOUT CONTRAST TECHNIQUE: Multiplanar, multisequence MR imaging was performed. No intravenous contrast was administered. COMPARISON:  MRI of the pelvis 09/01/2014. FINDINGS: There is partial visualization of marrow edema in the left parasymphyseal pubic bone, unchanged. No fracture is identified. A large ulceration over the right ischial tuberosity extends to bone. The wound appears to be packed. Mildly increased T2 signal and thickening are seen about the margins of the wound which could be due to infection or granulation tissue. A small focus of marrow edema is seen subjacent to the ulcer in the ischium compatible with osteomyelitis. No soft tissue abscess is identified. An ulceration is also seen over the left ischial tuberosity which may be healed. Marked edema is seen in subcutaneous soft tissues over the right greater trochanter. Small focus of mild marrow edema is seen in the underlying greater trochanter. Bone marrow signal is normal in the sacrum. There is no SI joint effusion. Small amount of fluid is present in the hip joints bilaterally most compatible with physiologic change. No muscle or tendon tear is seen. Imaged intrapelvic contents demonstrated a Foley catheter in place. The urinary bladder is decompressed. Its walls appear markedly thickened. IMPRESSION: Large ulceration over the right ischial tuberosity extends to bone. Mild marrow edema is seen in the subjacent ischial tuberosity compatible with osteomyelitis. Subcutaneous edema over the right greater trochanter may be due to cellulitis. Small focus of mild marrow edema in the greater trochanter is worrisome for osteomyelitis. The urinary bladder is nearly completely decompressed but its walls appear markedly  thickened which may be due to chronic bladder outlet obstruction and/or cystitis. Electronically Signed   By: Inge Rise M.D.   On: 02/14/2016 16:03    Cardiac Studies   Study Conclusions  - Left ventricle: The cavity size was normal. Wall thickness was   normal. Systolic function was moderately reduced. The estimated   ejection fraction was in the range of 35% to 40%. Moderate   diffuse hypokinesis with no identifiable regional variations.   Doppler parameters  are consistent with abnormal left ventricular   relaxation (grade 1 diastolic dysfunction). Indeterminate   ventricular filling pressure. Acoustic contrast opacification   revealed no evidence ofthrombus. - Mitral valve: Mildly calcified annulus. Mildly thickened leaflets   . Mild myxomatous degeneration. Systolic bowing without prolapse.   There was mild to moderate regurgitation directed centrally. - Left atrium: The atrium was mildly dilated. - Pericardium, extracardiac: A trivial pericardial effusion was   identified. There was a left pleural effusion.  Patient Profile     75 yr old male with PMH significant for multiple sclerosis (diagnosed in early 30's) with quadriplegia, chronic decubitus ulcer, anemia, osteomyelitis, CKD stage III, HTN, IDDM, recurrent UTI's who presented with new onset of dyspnea and found to have newly depressed LV systolic function, EF 54-56% (no regional wall motion abnormalities, EF previously 50-55% 09/02/14) and acute on chronic renal failure.  Assessment & Plan    1. Acute systolic heart failure/cardiomyopathy: Given that there are no regional wall motion abnormalities, this may be nonischemic in etiology. ECG shows sinus tachycardia with nonspecific T wave abnormalities in high lateral leads. Troponin elevation is minimal and flat consistent with demand ischemia. Would not pursue coronary angiography given acute on chronic renal failure. Cannot use ACEI's/ARB's. Will continue carvedilol  3.125 mg BID as he has hypertension as well.  Given worsening renal failure, I stopped scheduled IV Lasix on 10/14. I agree with nephrology recommendations with respect to using it prn.   2. New cardiomyopathy, EF 35-40%: Given that there are no regional wall motion abnormalities, this may be nonischemic in etiology. ECG shows sinus tachycardia with nonspecific T wave abnormalities in high lateral leads. Troponin elevation is minimal and flat consistent with demand ischemia. Would not pursue coronary angiography given acute on chronic renal failure.  Continue ASA and statin. Would recommend outpatient nuclear stress test to assess for an ischemic etiology. LVEF will need to be reassessed in several months to check for interval improvement.  3. HTN: Continue Coreg 3.125 mg BID.  4. Acute on chronic renal failure: Given worsening renal failure, he may require renal biopsy and dialysis. Additional recommendations as per nephrology.   Signed, Kate Sable, MD  02/16/2016, 8:30 AM

## 2016-02-16 NOTE — Progress Notes (Signed)
[facility] for Infectious Disease    Date of Admission:  02/13/2016   Total days of antibiotics 2/dapto  ID: Lance [name] is a 75 y.o. male with advanced MS, spastic paraplegia > 16yrs, has ischial decub found to have osteo, but presents with shortness of breath, acute on chronic kidney disease and decreased EF, concerning for new cardiorenal syndrome Principal Problem:   Acute CHF (congestive heart failure) ([place]) Active Problems:   Multiple sclerosis ([place])   Sacral decubitus ulcer   Normocytic anemia   Spastic quadriplegia (HCC)   Chronic kidney disease, stage 3   Essential hypertension, benign   DM (diabetes mellitus) type II controlled with renal manifestation (HCC)   Recurrent UTI (urinary tract infection)   AKI (acute kidney injury) ([place])   Elevated troponin   Severe protein-calorie malnutrition ([place])   Acute pulmonary edema (HCC)    Subjective: Afebrile, but having nausea and poor po intake. Dressing change this morning without difficulty  Medications:  . aspirin EC  81 mg Oral Daily  . carvedilol  3.125 mg Oral BID WC  . chlorhexidine  15 mL Mouth Rinse BID  . collagenase   Topical Daily  . DAPTOmycin (CUBICIN)  IV  8 mg/kg Intravenous Q48H  . enoxaparin (LOVENOX) injection  30 mg Subcutaneous Q24H  . ferumoxytol  510 mg Intravenous Weekly  . insulin aspart  0-5 Units Subcutaneous QHS  . insulin aspart  0-9 Units Subcutaneous TID WC  . insulin detemir  10 Units Subcutaneous Daily  . mouth rinse  15 mL Mouth Rinse q12n4p  . multivitamin with minerals  1 tablet Oral Daily  . oxybutynin  5 mg Oral BID  . protein supplement shake  11 oz Oral BID BM    Objective: Vital signs in last 24 hours: Temp:  [97.9 F (36.6 C)-98.5 F (36.9 C)] 97.9 F (36.6 C) (10/15 0432) Pulse Rate:  [106-109] 106 (10/15 0432) Resp:  [20-24] 20 (10/15 0432) BP: (142-147)/(85-91) 142/85 (10/15 0432) SpO2:  [86 %-93 %] 93 % (10/15 0432) Weight:  [187 lb 6.3 oz (85 kg)]  187 lb 6.3 oz (85 kg) (10/15 0432)  Physical Exam  Constitutional: He is oriented to person, place, and time. He appears well-developed and well-nourished.older than stated age, frail No distress.  HENT:  Mouth/Throat: Oropharynx is clear and moist. No oropharyngeal exudate.  Cardiovascular: Normal rate, regular rhythm and normal heart sounds. Exam reveals no gallop and no friction rub.  No murmur heard.  Pulmonary/Chest: Effort normal and breath sounds normal. No respiratory distress. He has no wheezes.  Abdominal: Soft. Bowel sounds are normal. He exhibits no distension. There is no tenderness.  Lymphadenopathy:  He has no cervical adenopathy.  Neurological: He is alert and oriented to person, place, and time.  Skin: right ischial wound has serous drainage but overall good wound bed  Psychiatric: He has a normal mood and affect. His behavior is normal.    Lab Results  Recent Labs  02/14/16 1117 02/15/16 0532 02/16/16 0532  WBC 11.0* 11.1*  --   HGB 9.8* 9.4*  --   HCT 29.5* 27.3*  --   NA 136 139 137  K 4.8 5.6* 5.1  CL 108 110 108  CO2 19* 18* 19*  BUN 95* 105* 105*  CREATININE 3.51* 3.71* 4.02*   Liver Panel  Recent Labs  02/14/16 0020 02/15/16 0532 02/16/16 0532  PROT 7.0  --   --   ALBUMIN 2.5* 2.6* 2.5*  AST 18  --   --  ALT 15*  --   --   ALKPHOS 68  --   --   BILITOT 0.8  --   --    Sedimentation Rate  Recent Labs  02/15/16 1428  ESRSEDRATE 125*   C-Reactive Protein  Recent Labs  02/14/16 0528  CRP 3.4*    Microbiology: 10/14 blood cx ngtd Studies/Results: No results found.   Assessment/Plan: Ischial osteo = plan on 6 wk of daptomycin at 8mg /kg QOD, or renally dosed. Thus far blood cx are no growth todate. Can get picc line on Tuesday if culture remain negative  Bigger issue is determining cause of cardio-renal syndrome, Cr continues to worsen, though has poor po intake. Patient is scheduled for renal biopsy  New cardiomyopathy =  consider doing nuclear stress test as inpatient, defer to cardiology for management  Dr comer to follow pt if he remains at [facility], I will see him if he transfers to [place], [facility] for Infectious Diseases Cell: [phone] Pager: [phone]  02/16/2016, 4:28 PM

## 2016-02-17 ENCOUNTER — Encounter (HOSPITAL_COMMUNITY): Payer: Self-pay | Admitting: Family Medicine

## 2016-02-17 ENCOUNTER — Inpatient Hospital Stay (HOSPITAL_COMMUNITY): Payer: Medicare Other

## 2016-02-17 DIAGNOSIS — F419 Anxiety disorder, unspecified: Secondary | ICD-10-CM

## 2016-02-17 DIAGNOSIS — Z7409 Other reduced mobility: Secondary | ICD-10-CM | POA: Insufficient documentation

## 2016-02-17 DIAGNOSIS — R0602 Shortness of breath: Secondary | ICD-10-CM

## 2016-02-17 DIAGNOSIS — I429 Cardiomyopathy, unspecified: Secondary | ICD-10-CM

## 2016-02-17 DIAGNOSIS — R0902 Hypoxemia: Secondary | ICD-10-CM | POA: Diagnosis present

## 2016-02-17 DIAGNOSIS — F411 Generalized anxiety disorder: Secondary | ICD-10-CM | POA: Diagnosis present

## 2016-02-17 DIAGNOSIS — M869 Osteomyelitis, unspecified: Secondary | ICD-10-CM

## 2016-02-17 DIAGNOSIS — N289 Disorder of kidney and ureter, unspecified: Secondary | ICD-10-CM

## 2016-02-17 HISTORY — DX: Cardiomyopathy, unspecified: I42.9

## 2016-02-17 LAB — RENAL FUNCTION PANEL
ALBUMIN: 2.6 g/dL — AB (ref 3.5–5.0)
Albumin: 2.5 g/dL — ABNORMAL LOW (ref 3.5–5.0)
Anion gap: 12 (ref 5–15)
Anion gap: 11 (ref 5–15)
BUN: 116 mg/dL — AB (ref 6–20)
BUN: 119 mg/dL — AB (ref 6–20)
CALCIUM: 8.9 mg/dL (ref 8.9–10.3)
CHLORIDE: 105 mmol/L (ref 101–111)
CO2: 21 mmol/L — ABNORMAL LOW (ref 22–32)
CO2: 20 mmol/L — AB (ref 22–32)
CREATININE: 4.15 mg/dL — AB (ref 0.61–1.24)
CREATININE: 4.06 mg/dL — AB (ref 0.61–1.24)
Calcium: 8.4 mg/dL — ABNORMAL LOW (ref 8.9–10.3)
Chloride: 107 mmol/L (ref 101–111)
GFR calc Af Amer: 15 mL/min — ABNORMAL LOW (ref >60)
GFR calc Af Amer: 15 mL/min — ABNORMAL LOW (ref >60)
GFR, EST NON AFRICAN AMERICAN: 13 mL/min — AB (ref >60)
GFR, EST NON AFRICAN AMERICAN: 13 mL/min — AB (ref >60)
GLUCOSE: 204 mg/dL — AB (ref 65–99)
Glucose, Bld: 169 mg/dL — ABNORMAL HIGH (ref 65–99)
PHOSPHORUS: 9.2 mg/dL — AB (ref 2.5–4.6)
POTASSIUM: 5.6 mmol/L — AB (ref 3.5–5.1)
POTASSIUM: 5.1 mmol/L (ref 3.5–5.1)
Phosphorus: 9.6 mg/dL — ABNORMAL HIGH (ref 2.5–4.6)
Sodium: 140 mmol/L (ref 135–145)
Sodium: 136 mmol/L (ref 135–145)

## 2016-02-17 LAB — BLOOD GAS, ARTERIAL
Acid-base deficit: 6.5 mmol/L — ABNORMAL HIGH (ref 0.0–2.0)
Bicarbonate: 20.4 mmol/L (ref 20.0–28.0)
DELIVERY SYSTEMS: POSITIVE
EXPIRATORY PAP: 7
FIO2: 60
INSPIRATORY PAP: 14
MODE: POSITIVE
O2 Saturation: 98.8 %
PH ART: 7.234 — AB (ref 7.350–7.450)
Patient temperature: 98.6
pCO2 arterial: 50.1 mmHg — ABNORMAL HIGH (ref 32.0–48.0)
pO2, Arterial: 156 mmHg — ABNORMAL HIGH (ref 83.0–108.0)

## 2016-02-17 LAB — PROTEIN ELECTROPHORESIS, SERUM
A/G Ratio: 0.6 — ABNORMAL LOW (ref 0.7–1.7)
ALPHA-2-GLOBULIN: 0.8 g/dL (ref 0.4–1.0)
Albumin ELP: 2.5 g/dL — ABNORMAL LOW (ref 2.9–4.4)
Alpha-1-Globulin: 0.4 g/dL (ref 0.0–0.4)
Beta Globulin: 1.5 g/dL — ABNORMAL HIGH (ref 0.7–1.3)
GLOBULIN, TOTAL: 4.2 g/dL — AB (ref 2.2–3.9)
Gamma Globulin: 1.5 g/dL (ref 0.4–1.8)
TOTAL PROTEIN ELP: 6.7 g/dL (ref 6.0–8.5)

## 2016-02-17 LAB — GLUCOSE, CAPILLARY
GLUCOSE-CAPILLARY: 194 mg/dL — AB (ref 65–99)
GLUCOSE-CAPILLARY: 202 mg/dL — AB (ref 65–99)
Glucose-Capillary: 201 mg/dL — ABNORMAL HIGH (ref 65–99)
Glucose-Capillary: 126 mg/dL — ABNORMAL HIGH (ref 65–99)

## 2016-02-17 LAB — POCT ACTIVATED CLOTTING TIME
ACTIVATED CLOTTING TIME: 158 s
Activated Clotting Time: 131 seconds
Activated Clotting Time: 142 seconds
Activated Clotting Time: 158 seconds
Activated Clotting Time: 164 seconds
Activated Clotting Time: 164 seconds
Activated Clotting Time: 164 seconds

## 2016-02-17 LAB — CBC
HEMATOCRIT: 29.4 % — AB (ref 39.0–52.0)
Hemoglobin: 9.8 g/dL — ABNORMAL LOW (ref 13.0–17.0)
MCH: 30 pg (ref 26.0–34.0)
MCHC: 33.3 g/dL (ref 30.0–36.0)
MCV: 89.9 fL (ref 78.0–100.0)
PLATELETS: 202 10*3/uL (ref 150–400)
RBC: 3.27 MIL/uL — ABNORMAL LOW (ref 4.22–5.81)
RDW: 17.3 % — AB (ref 11.5–15.5)
WBC: 12.6 10*3/uL — AB (ref 4.0–10.5)

## 2016-02-17 LAB — ANTINUCLEAR ANTIBODIES, IFA: ANA Ab, IFA: NEGATIVE

## 2016-02-17 LAB — GLOMERULAR BASEMENT MEMBRANE ANTIBODIES: GBM AB: 6 U (ref 0–20)

## 2016-02-17 LAB — MPO/PR-3 (ANCA) ANTIBODIES: Myeloperoxidase Abs: <9 U/mL (ref 0.0–9.0)

## 2016-02-17 LAB — ANTI-DNA ANTIBODY, DOUBLE-STRANDED: DS DNA AB: 3 [IU]/mL (ref 0–9)

## 2016-02-17 MED ORDER — PRISMASOL BGK 4/2.5 32-4-2.5 MEQ/L IV SOLN
INTRAVENOUS | Status: DC
  Administered 2016-02-17 – 2016-02-19 (×3): via INTRAVENOUS_CENTRAL
  Filled 2016-02-17 (×6): qty 5000

## 2016-02-17 MED ORDER — PRISMASOL BGK 4/2.5 32-4-2.5 MEQ/L IV SOLN
INTRAVENOUS | Status: DC
  Administered 2016-02-17 – 2016-02-20 (×19): via INTRAVENOUS_CENTRAL
  Filled 2016-02-17 (×29): qty 5000

## 2016-02-17 MED ORDER — ALTEPLASE 2 MG IJ SOLR
2.0000 mg | Freq: Once | INTRAMUSCULAR | Status: DC | PRN
  Filled 2016-02-17: qty 2

## 2016-02-17 MED ORDER — PRISMASOL BGK 4/2.5 32-4-2.5 MEQ/L IV SOLN
INTRAVENOUS | Status: DC
  Administered 2016-02-17 – 2016-02-19 (×6): via INTRAVENOUS_CENTRAL
  Filled 2016-02-17 (×9): qty 5000

## 2016-02-17 MED ORDER — HEPARIN BOLUS VIA INFUSION (CRRT)
1000.0000 [IU] | INTRAVENOUS | Status: DC | PRN
  Administered 2016-02-17 – 2016-02-20 (×3): 1000 [IU] via INTRAVENOUS_CENTRAL
  Filled 2016-02-17: qty 1000

## 2016-02-17 MED ORDER — ALPRAZOLAM 0.25 MG PO TABS
0.2500 mg | ORAL_TABLET | Freq: Three times a day (TID) | ORAL | Status: DC | PRN
  Administered 2016-02-17: 0.25 mg via ORAL
  Filled 2016-02-17 (×2): qty 1

## 2016-02-17 MED ORDER — ENSURE ENLIVE PO LIQD: 237.0000 mL | Freq: Two times a day (BID) | ORAL | Status: DC

## 2016-02-17 MED ORDER — SODIUM CHLORIDE 0.9 % FOR CRRT
INTRAVENOUS_CENTRAL | Status: DC | PRN
  Administered 2016-02-18 – 2016-02-19 (×2): via INTRAVENOUS_CENTRAL
  Filled 2016-02-17 (×3): qty 1000

## 2016-02-17 MED ORDER — SODIUM POLYSTYRENE SULFONATE 15 GM/60ML PO SUSP
30.0000 g | Freq: Once | ORAL | Status: AC
Start: 2016-02-17 — End: 2016-02-17
  Administered 2016-02-17: 30 g via ORAL
  Filled 2016-02-17: qty 120

## 2016-02-17 MED ORDER — SODIUM CHLORIDE 0.9 % IJ SOLN
250.0000 [IU]/h | INTRAMUSCULAR | Status: DC
  Administered 2016-02-17: 450 [IU]/h via INTRAVENOUS_CENTRAL
  Administered 2016-02-17: 250 [IU]/h via INTRAVENOUS_CENTRAL
  Administered 2016-02-18: 1250 [IU]/h via INTRAVENOUS_CENTRAL
  Administered 2016-02-18: 1400 [IU]/h via INTRAVENOUS_CENTRAL
  Administered 2016-02-19: 800 [IU]/h via INTRAVENOUS_CENTRAL
  Administered 2016-02-20: 1600 [IU]/h via INTRAVENOUS_CENTRAL
  Administered 2016-02-20: 1100 [IU]/h via INTRAVENOUS_CENTRAL
  Filled 2016-02-17 (×7): qty 2

## 2016-02-17 MED ORDER — HEPARIN SODIUM (PORCINE) 1000 UNIT/ML DIALYSIS
1000.0000 [IU] | INTRAMUSCULAR | Status: DC | PRN
  Administered 2016-02-20 (×2): 1400 [IU] via INTRAVENOUS_CENTRAL
  Filled 2016-02-17: qty 4
  Filled 2016-02-17: qty 6

## 2016-02-17 NOTE — Progress Notes (Signed)
[facility] Rounding Note Subjective: Has declined sig the last 6-12 hours, can barely speak, looks pale,+SOB, no cough or CP.  Repeat CXR today w severe pulm edema worsening c/t 10/13 film  Objective Vital signs in last 24 hours: Vitals:   02/16/16 2052 02/17/16 0454 02/17/16 1300 02/17/16 1355  BP: (!) 155/89 (!) 159/85    Pulse: 92 96    Resp: 18 18    Temp: 97.8 F (36.6 C) 97.7 F (36.5 C)    TempSrc: Oral Oral    SpO2: 94% 91% (!) 87% 92%  Weight:  84.7 kg (186 lb 11.7 oz)    Height:       Weight change: -0.3 kg (-10.6 oz)  Intake/Output Summary (Last 24 hours) at 02/17/16 1506 Last data filed at 02/17/16 0800  Gross per 24 hour  Intake              410 ml  Output              825 ml  Net             -415 ml   Physical Exam:  Blood pressure (!) 159/85, pulse 96, temperature 97.7 F (36.5 C), temperature source Oral, resp. rate 18, height 6\' 4"  (1.93 m), weight 84.7 kg (186 lb 11.7 oz), SpO2 92 %. Gen: Tall thin WM, lethargic, whispervoice VS as noted Pale  No JVD Crackles both lung bases Cardiac rhythm is regular.  S1S2 No S3.  Soft apical pansystolic murmur.  No diastolic murmur or rub. Abdomen protuberant.  Bruising from insulin injection. No focal tenderness. Ext in sheepskin lined immobilizing splint devices - trace pretibial edema only Neuro: barely awake, nods head and answers appropriately   Recent Labs Lab 02/14/16 0020 02/14/16 1117 02/15/16 0532 02/16/16 0532 02/17/16 0352  NA 137 136 139 137 140  K 4.5 4.8 5.6* 5.1 5.6*  CL 112* 108 110 108 107  CO2 17* 19* 18* 19* 21*  GLUCOSE 143* 250* 181* 183* 169*  BUN 90* 95* 105* 105* 116*  CREATININE 3.30* 3.51* 3.71* 4.02* 4.15*  CALCIUM 8.4* 8.6* 8.9 8.4* 8.9  PHOS  --   --  6.2* 7.4* 9.2*     Recent Labs Lab 02/14/16 0020 02/15/16 0532 02/16/16 0532 02/17/16 0352  AST 18  --   --   --   ALT 15*  --   --   --   ALKPHOS 68  --   --   --   BILITOT 0.8  --   --   --   PROT 7.0  --   --   --    ALBUMIN 2.5* 2.6* 2.5* 2.6*     Recent Labs Lab 02/14/16 0020 02/14/16 1117 02/15/16 0532 02/17/16 0352  WBC 14.2* 11.0* 11.1* 12.6*  NEUTROABS 11.1*  --  8.1*  --   HGB 9.7* 9.8* 9.4* 9.8*  HCT 28.2* 29.5* 27.3* 29.4*  MCV 87.0 90.2 87.8 89.9  PLT 224 221 194 202     Recent Labs Lab 02/14/16 0020 02/14/16 1117 02/15/16 0532 02/15/16 1428  CKTOTAL  --   --   --  33*  TROPONINI 0.15* 0.19* 0.23*  --    CBG:  Recent Labs Lab 02/16/16 1145 02/16/16 1710 02/16/16 2024 02/17/16 0807 02/17/16 1136  GLUCAP 186* 137* 154* 194* 202*    Iron/TIBC/Ferritin/ %Sat    Component Value Date/Time   IRON 28 (L) 02/15/2016 0532   TIBC 193 (L) 02/15/2016 0532   FERRITIN  316 08/31/2014 0036   IRONPCTSAT 14 (L) 02/15/2016 0532   Studies/Results: Dg Chest Port 1 View  Result Date: 02/17/2016 CLINICAL DATA:  Acute congestive heart failure. Acute renal failure. Worsening shortness of breath and hypoxia. EXAM: PORTABLE CHEST 1 VIEW COMPARISON:  02/14/2016 FINDINGS: Worsening diffuse symmetric bilateral airspace disease is seen, most likely due to worsening pulmonary edema. Bilateral pleural effusions are again noted. No pneumothorax visualized. Heart size is stable. IMPRESSION: Worsening diffuse symmetric airspace disease, most likely due ago pulmonary edema. Bilateral pleural effusions again noted. Electronically Signed   By: Earle Gell M.D.   On: 02/17/2016 14:15   Medications:   . aspirin EC  81 mg Oral Daily  . carvedilol  3.125 mg Oral BID WC  . chlorhexidine  15 mL Mouth Rinse BID  . collagenase   Topical Daily  . DAPTOmycin (CUBICIN)  IV  8 mg/kg Intravenous Q48H  . enoxaparin (LOVENOX) injection  30 mg Subcutaneous Q24H  . feeding supplement (ENSURE ENLIVE)  237 mL Oral BID BM  . ferumoxytol  510 mg Intravenous Weekly  . insulin aspart  0-5 Units Subcutaneous QHS  . insulin aspart  0-9 Units Subcutaneous TID WC  . insulin detemir  10 Units Subcutaneous Daily  .  mouth rinse  15 mL Mouth Rinse q12n4p  . multivitamin with minerals  1 tablet Oral Daily  . oxybutynin  5 mg Oral BID  . protein supplement shake  11 oz Oral BID BM    Background:  75 y.o. year-old with PMH significant for DM2, HTN, multiple sclerosis, spastic quadriplegia, urinary retention, h/o decubitus/ischial ulcers, baseline CKD. Recently hospitalized (9/29-10/4) with R ischial pressure ulcer, that was debrided, treated with 6 days of vanco and zosyn (for this plus a klebsiella and strep UTI) Discharged  10/4 (with a creatinine of 1.82) to finish 4 more days of bactrim. Was brought back to the hospital from  [facility] (there for rehab) with new onset dyspnea, and hypoxemia. CXR in the ED showed pulmonary edema, creatinine was noted to be elevated at 3.3 and he was admitted. BNP elevated >900, troponins elevated.   ECHO LV EF 35% - 40% (down from 50-55% 09/2014), moderate diffuse hypokinesis, mild to moderate MR and Grade 1 diastolic dysfunction. We were asked to see re his AKI on CKD.  Assessment: 1. AKI on CKD 3 - unclear cause, +proteinuria, microhematuria. Serologies negative so far except ASO +.  Could have ICGN from infection. Need dialysis (^BUN/Cr, worsening pulm edema). Plan place temp HD cath and do HD.  2. Hyperkalemia 3. CKD3 - baseline creat 1.8 4. Pulm edema - due to vol overload/ renal failure, hx CM EF 35-40%, much worse today 5. Anemia - Fe def 6. R ischial osteomyelitis - ID started him on daptomycin 7. IDDM  Plan - RR called, move to ICU, get Bipap on and then will place temp cath; if deteriorates will need intubation. HD at [facility] if stabilizes to transfer, vs. CRRT here, get vol down either way.    [name] MD [employer] pgr [phone]   02/17/2016, 3:14 PM

## 2016-02-17 NOTE — Progress Notes (Signed)
Nutrition Follow-up  DOCUMENTATION CODES:   Not applicable  INTERVENTION:  Encouraged patient to continue PO intake as tolerated.  Continue Premier Protein po BID, each supplement provides 160 kcal and 30 grams protein.  Ordered Ensure Enlive po BID, each supplement provides 350 kcal and 20 grams of protein.  If patient begins dialysis, may need to consider oral nutrition supplements that are more appropriate for renal patients (Nepro).   Recommend patient receive renal-appropriate multivitamin with minerals instead of regular in setting of acute on chronic renal failure.  NUTRITION DIAGNOSIS:   Inadequate oral intake related to poor appetite, nausea as evidenced by per patient/family report.  Ongoing.  GOAL:   Patient will meet greater than or equal to 90% of their needs  Not met.  MONITOR:   PO intake, Supplement acceptance, Labs, Weight trends, I & O's, Skin  REASON FOR ASSESSMENT:   Malnutrition Screening Tool, Consult Assessment of nutrition requirement/status  ASSESSMENT:   75 y.o. male with a past medical history significant for MS with quadruplegia, chronic decubitus ulcer, anemia, CKDIII baseline Cr 1.7-1.8, HTN, IDDM and recurrent UTI with indwelling foley catheter who presents with dyspnea.  -Patient found to have newly depressed LV systolic function. EF now 35-40%. -With patient's worsening renal failure, team believes he may require renal biopsy and dialysis. In that case, patient would transfer to [facility].  Family present at time of assessment. Patient appears very lethargic and is more soft-spoken than usual. He reports poor appetite and nausea for 1 week PTA. He was really only able to have 1 meal per day and 1 Premier Protein shake daily. He is able to have small meals now, but thinks he will tolerate drinks better. Patient and family want to add Ensure so he can have some extra calories in setting of poor PO intake.  In the past 24 hours patient has  had 614 kcal (26% minimum estimated kcal needs) and 28 grams protein (34% minimum estimated protein needs).   Medications reviewed and include: Novolog sliding scale daily at bedtime and TID with meals, Levemir 10 units daily, multivitamin with minerals, Zofran prn.  Labs reviewed: CBG 137-202 past 24 hrs, Potassium 5.6, CO2 21, BUN 116, Creatinine 4.15, Phosphorus 9.2.  Nutrition-Focused physical exam completed since could not assess at initial assessment. Findings are mild-moderate fat depletion, severe muscle depletion, and no edema. Depletion of muscle mass in lower body is in setting of paraplegia.   Diet Order:  Diet renal/carb modified with fluid restriction Diet-HS Snack? Nothing; Room service appropriate? Yes; Fluid consistency: Thin  Skin:  Wound (see comment) (Unstageable to buttocks)  Last BM:  02/16/2016  Height:   Ht Readings from Last 1 Encounters:  02/14/16 '6\' 4"'  (1.93 m)    Weight:   Wt Readings from Last 1 Encounters:  02/17/16 186 lb 11.7 oz (84.7 kg)    Ideal Body Weight:  91.82 kg  BMI:  Body mass index is 22.73 kg/m.  Estimated Nutritional Needs:   Kcal:  0240 (27.9 kcal/kg per EAL for paraplegia)  Protein:  83-125 grams (1-1.5 g/kg per EAL for paraplegia with chronic wound)  Fluid:  2.3 L/day  EDUCATION NEEDS:   Education needs no appropriate at this time  Willey Blade, MS, RD, LDN Pager: [phone] After Hours Pager: [phone]

## 2016-02-17 NOTE — Progress Notes (Signed)
PROGRESS NOTE    Lance [name]  OZH:[id number]  [name]: [birth year]  DOA: 02/13/2016 PCP: [name], MD  Hospital course: The patient is a 75 y.o. year-old with PMH significant for DM2, HTN, multiple sclerosis, spastic quadriplegia, urinary retention, h/o decubitus ulcers/ischial osteo Left (2016 with MRSA bacteremia treated with 7 weeks of vancomycin).  He was recently hospitalized (9/29-10/4) because of a wound associated with a R ischial pressure ulcer, that was debrided at bedside, treated with 6 days of vanco and zosyn ( for this plus a klebsiella and strep UTI) He was discharged on 10/4 (with a creatinine of 1.82.) to finish 4 more days of bactrim.  He was brought back to the hospital from [facility] with new onset dyspnea, and hypoxemia. CXR in the ED showed pulmonary edema, creatinine was noted to be elevated at 3.3 and he was admitted. BNP elevated >900, troponins elevated.  Assessment & Plan:   1. Cardiorenal syndrome/Acute systolic Heart Failure/Cardiomyopathy Baseline Cr 1.7.  Baseline EF 50%, no previous CHF flare.  Unclear precipitating factor.  Appreciate cardiology and nephrology team assistance.  ECHO LV EF 35% - 40% (down from 50-55% 09/2014), moderate diffuse hypokinesis, mild to moderate MR and Grade 1 diastolic dysfunction.  Not diuresing well and increasing O2 requirement, check repeat CXR.  Received lasix 80 mg IV 10/15 and did not diurese much.    2. Acute on Chronic Renal Failure (CKD3) - Renal function not improved, potassium trending up, will give kayelate p.o. Renal Team following.  May need transfer to [facility] for HD and renal biopsy. Await renal recommendations.    3. MS with spastic quadruplegia: Air overlay mattress, frequent turning   3. Anemia: nephrology team starting aranesp 10/15, received feraheme 10/14.  4. Leukocytosis:  Likely secondary to right ischial sacral wound infection with osteomyelitis.  Ulcer not festering, but noted that CRP/ESR  elevated during last hospitalization (and he had osteomyelitis of the pelvis >1 year ago in the setting of TEE- MRSA bacteremia for which he had 7 weeks of IV vancomycin with ID, Dr. Johnnye Sima)  - He is now on daptomycin for treatment of his osteomyelitis.    5. IDDM:  -Continue Levemir, lower dose given renal failure -SSI with meals  CBG (last 3)   Recent Labs  02/16/16 1710 02/16/16 2024 02/17/16 0807  GLUCAP 137* 154* 194*   6. HTN:  Currently hypertensive in the setting of fluid overload. -Continue aspirin and statin, coreg  7. Recurrent UTI with indwelling foley:  Foley last changed before last course of antibiotics. -Continue oxybutynin  8. Severe protein calorie malnutrition: Continue to encourage supplements, consulted dietitian  9. Right Ischial Osteomyelitis;  MRI sacrum positive for osteomyelitis, ID consulted, started daptomycin 10/14 per ID Baxter Flattery) will need 6 weeks of therapy : needs weekly CK tests while on daptomycin -Consult WOC, Pt is followed by plastic surgery at [facility] health and s/p skin flap surgery  --Pt now on day 3/42 of daptomycin therapy  10. Generalized Anxiety - Pt started on low dose alprazolam 0.25 mg TID prn.    DVT prophylaxis: Lovenox  Code Status: FULL  Family Communication: None present  Disposition Plan: TBD Consults called: None overnight Admission status: INPATIENT, telemetry  Consultants:  Nephrology  Cardiology  ID  Procedures:  MRI sacrum  Subjective: Pt having a lot of anxiety today  Objective: Vitals:   02/16/16 0432 02/16/16 1719 02/16/16 2052 02/17/16 0454  BP: (!) 142/85 (!) 151/83 (!) 155/89 (!) 159/85  Pulse: Marland Kitchen)  106 97 92 96  Resp: _0 Temp: 97.9 F (36.6 C) 98.1 F (36.7 C) 97.8 F (36.6 C) 97.7 F (36.5 C)  TempSrc: Oral Oral Oral Oral  SpO2: 93% 94% 94% 91%  Weight: 85 kg (187 lb 6.3 oz)   84.7 kg (186 lb 11.7 oz)  Height:        Intake/Output Summary (Last 24 hours)  at 02/17/16 1003 Last data filed at 02/17/16 0800  Gross per 24 hour  Intake              410 ml  Output              825 ml  Net             -415 ml   Filed Weights   02/15/16 0544 02/16/16 0432 02/17/16 0454  Weight: 84.5 kg (186 lb 4.6 oz) 85 kg (187 lb 6.3 oz) 84.7 kg (186 lb 11.7 oz)    Exam:  General exam:  Frail debilitated elderly male, chronically ill appearing, alert and in no acute distress. Respiratory system: scattered crackles heard. No increased work of breathing. Cardiovascular system: S1 & S2 heard, RRR. No JVD, murmurs, gallops, clicks or pedal edema. Gastrointestinal system: Abdomen is nondistended, soft and nontender. Normal bowel sounds heard. Central nervous system: Alert and oriented. No focal neurological deficits. Extremities: no CCE. Skin: large right ischial stage 4 ulcer to bone  Data Reviewed: Basic Metabolic Panel:  Recent Labs Lab 02/14/16 0020 02/14/16 1117 02/15/16 0532 02/16/16 0532 02/17/16 0352  NA 137 136 139 137 140  K 4.5 4.8 5.6* 5.1 5.6*  CL 112* 108 110 108 107  CO2 17* 19* 18* 19* 21*  GLUCOSE 143* 250* 181* 183* 169*  BUN 90* 95* 105* 105* 116*  CREATININE 3.30* 3.51* 3.71* 4.02* 4.15*  CALCIUM 8.4* 8.6* 8.9 8.4* 8.9  PHOS  --   --  6.2* 7.4* 9.2*   Liver Function Tests:  Recent Labs Lab 02/14/16 0020 02/15/16 0532 02/16/16 0532 02/17/16 0352  AST 18  --   --   --   ALT 15*  --   --   --   ALKPHOS 68  --   --   --   BILITOT 0.8  --   --   --   PROT 7.0  --   --   --   ALBUMIN 2.5* 2.6* 2.5* 2.6*   No results for input(s): LIPASE, AMYLASE in the last 168 hours. No results for input(s): AMMONIA in the last 168 hours. CBC:  Recent Labs Lab 02/14/16 0020 02/14/16 1117 02/15/16 0532 02/17/16 0352  WBC 14.2* 11.0* 11.1* 12.6*  NEUTROABS 11.1*  --  8.1*  --   HGB 9.7* 9.8* 9.4* 9.8*  HCT 28.2* 29.5* 27.3* 29.4*  MCV 87.0 90.2 87.8 89.9  PLT 224 221 194 202   Cardiac Enzymes:  Recent Labs Lab  02/14/16 0020 02/14/16 1117 02/15/16 0532 02/15/16 1428  CKTOTAL  --   --   --  33*  TROPONINI 0.15* 0.19* 0.23*  --    CBG (last 3)   Recent Labs  02/16/16 1710 02/16/16 2024 02/17/16 0807  GLUCAP 137* 154* 194*   Recent Results (from the past 240 hour(s))  Culture, blood (Routine X 2) w Reflex to ID Panel     Status: None (Preliminary result)   Collection Time: 02/15/16  1:00 PM  Result Value Ref Range Status   Specimen Description BLOOD RIGHT ARM  2 ML IN YELLOW  Final   Special Requests NONE  Final   Culture   Final    NO GROWTH 1 DAY Performed at [facility]    Report Status PENDING  Incomplete  Culture, blood (Routine X 2) w Reflex to ID Panel     Status: None (Preliminary result)   Collection Time: 02/15/16  1:00 PM  Result Value Ref Range Status   Specimen Description BLOOD LEFT ARM  3 ML IN YELLOW  Final   Special Requests NONE  Final   Culture   Final    NO GROWTH 1 DAY Performed at [facility]    Report Status PENDING  Incomplete    Studies: No results found. Scheduled Meds: . aspirin EC  81 mg Oral Daily  . carvedilol  3.125 mg Oral BID WC  . chlorhexidine  15 mL Mouth Rinse BID  . collagenase   Topical Daily  . DAPTOmycin (CUBICIN)  IV  8 mg/kg Intravenous Q48H  . enoxaparin (LOVENOX) injection  30 mg Subcutaneous Q24H  . ferumoxytol  510 mg Intravenous Weekly  . insulin aspart  0-5 Units Subcutaneous QHS  . insulin aspart  0-9 Units Subcutaneous TID WC  . insulin detemir  10 Units Subcutaneous Daily  . mouth rinse  15 mL Mouth Rinse q12n4p  . multivitamin with minerals  1 tablet Oral Daily  . oxybutynin  5 mg Oral BID  . protein supplement shake  11 oz Oral BID BM  . sodium polystyrene  30 g Oral Once   Continuous Infusions:   Principal Problem:   Acute CHF (congestive heart failure) (HCC) Active Problems:   Multiple sclerosis (HCC)   Sacral decubitus ulcer   Normocytic anemia   Spastic quadriplegia (HCC)   Chronic  kidney disease, stage 3   Essential hypertension, benign   DM (diabetes mellitus) type II controlled with renal manifestation (HCC)   Recurrent UTI (urinary tract infection)   AKI (acute kidney injury) (HCC)   Elevated troponin   Severe protein-calorie malnutrition ([place])   Acute pulmonary edema ([place])  Time spent:   Irwin Brakeman, MD, FAAFP [facility] Pager [phone]  If 7PM-7AM, please contact night-coverage [email or web address] Password [email or web address] 02/17/2016, 10:03 AM    LOS: 3 days

## 2016-02-17 NOTE — Progress Notes (Signed)
On Bipap, looks a little better but not safe to transfer.  Plan move to ICU and do CRRT here until resp status is stable.    [name] MD [employer] 02/17/2016, 3:37 PM

## 2016-02-17 NOTE — Progress Notes (Signed)
    [place] for Infectious Disease   Reason for visit: Follow up on osteomyelitis  Interval History: more sob, anxious; family at bedside  Physical Exam: Constitutional:  Vitals:   02/16/16 2052 02/17/16 0454  BP: (!) 155/89 (!) 159/85  Pulse:  96  Resp:  18  Temp:  97.7 F (36.5 C)   patient appears in NAD Respiratory: Normal respiratory effort; CTA B Cardiovascular: RRR GI: soft, nt  Review of Systems: Constitutional: negative for fevers and chills Gastrointestinal: negative for diarrhea  Lab Results  Component Value Date   WBC 12.6 (H) 02/17/2016   HGB 9.8 (L) 02/17/2016   HCT 29.4 (L) 02/17/2016   MCV 89.9 02/17/2016   PLT 202 02/17/2016    Lab Results  Component Value Date   CREATININE 4.15 (H) 02/17/2016   BUN 116 (H) 02/17/2016   NA 140 02/17/2016   K 5.6 (H) 02/17/2016   CL 107 02/17/2016   CO2 21 (L) 02/17/2016    Lab Results  Component Value Date   ALT 15 (L) 02/14/2016   AST 18 02/14/2016   ALKPHOS 68 02/14/2016     Microbiology: Recent Results (from the past 240 hour(s))  Culture, blood (Routine X 2) w Reflex to ID Panel     Status: None (Preliminary result)   Collection Time: 02/15/16  1:00 PM  Result Value Ref Range Status   Specimen Description BLOOD RIGHT ARM  2 ML IN YELLOW  Final   Special Requests NONE  Final   Culture   Final    NO GROWTH 1 DAY Performed at [facility]    Report Status PENDING  Incomplete  Culture, blood (Routine X 2) w Reflex to ID Panel     Status: None (Preliminary result)   Collection Time: 02/15/16  1:00 PM  Result Value Ref Range Status   Specimen Description BLOOD LEFT ARM  3 ML IN YELLOW  Final   Special Requests NONE  Final   Culture   Final    NO GROWTH 1 DAY Performed at [facility]    Report Status PENDING  Incomplete    Impression/Plan:  1. Osteomyelitis - will need prolonged treatment 6 weeks to help with wound healing.   2. Renal insufficiency - some mild worsening  with creat and patient more symptomatic.  Unclear if related to previous vancomycin and/or bactrim but on daptomycin now to avoid any nephrotoxicity.

## 2016-02-17 NOTE — Progress Notes (Signed)
[place] Progress Note Patient Name: Lance [name] [name]: 24-Mar-1941 MRN: [id number]   Date of Service  02/17/2016  HPI/Events of Note  Patient transferred to the intensive care unit from the floor with worsening respiratory status requiring BiPAP support. Camera check shows patient on BiPAP with IPAP 14 & EPAP 7. FiO2 at 0.4. Respiratory rate at 22. Patient appears to be breathing reasonably comfortably and reports as such. He is awake. Nurse at bedside. Echocardiogram from 10/13 shows grade 1 diastolic dysfunction with ejection fraction 35-40 percent. Patient currently planned for CVVHD with femoral HD catheter placed by nephrologist for further diuresis and overall improvement in pulmonary status. X-ray reviewed shows worsening bilateral lower lobe opacification with questionable consolidation within right lower lung zone with air bronchograms. Patient has a mild leukocytosis. Currently afebrile.   eICU Interventions  Continuing close ICU monitoring. Nurse to notify E Link M.D. for imaging increased requirement and BiPAP support that would prompt immediate evaluation by intensivist.      Intervention Category Evaluation Type: New Patient Evaluation  Tera Partridge 02/17/2016, 5:26 PM

## 2016-02-17 NOTE — Progress Notes (Addendum)
02/17/2016 2:52 PM  Update: RN called about patient's increasing O2 requirements.  STAT portable CXR obtained: worsening pulmonary edema.  I spoke with nephrologist, need to transfer patient to [facility] for hemodialysis.  Transfer orders placed.  Family updated at bedside.  I spoke with Dr. Jonnie Finner, will need to start bipap therapy.    Murvin Natal, MD

## 2016-02-17 NOTE — Procedures (Signed)
A R femoral vein 20 cm Trialysis temp HD catheter was placed under sterile conditions and using US guidance.  No complications, ready to use.   [name] MD [employer] pgr [phone]   02/17/2016, 4:15 PM

## 2016-02-17 NOTE — Progress Notes (Signed)
Pt a/o becoming decreasing lethargic and states he is having difficult breathing. Pt O2 started at 2l 91-93% 0800. Increased to 3 l at 1030 sats 89-90%, repositioned pt continue C/O SOB, 1135 am 02 increased to 5l to maintained sats at 90% pt more comfortable. Family at bedside during the morning, check pt again 1245 pm, LOC changed increased tireness, continues to converse with family and staff. MD notified of changes. Called to RR at 2:30-2:45 p,m  Pt calm, arouses to name call, family at bedside. O2 sat range 89-90% on 5 l, pt ordered Bi-Pap.Marland KitchenMarland KitchenNephrology In to see pt and Dr. Wynetta Emery at the bedside with pt as well. Pt prepared for transfer to CCU per Dr. Barry Dienes and Dr. Cindee Salt. SRP, RN

## 2016-02-17 NOTE — Progress Notes (Signed)
eLink Physician-Brief Progress Note Patient Name: Lance [name] [name]: 29-Jun-1940 MRN: [id number]   Date of Service  02/17/2016  HPI/Events of Note  Camera check. Patient currently on BiPAP with settings unchanged. Blood pressure stable with systolic 379. Tolerating CVVHD pulling 250 mL per hour. Nursing staff at bedside.   eICU Interventions  Continuing current plan of care with close monitoring.      Intervention Category Major Interventions: Respiratory failure - evaluation and management  Tera Partridge 02/17/2016, 10:02 PM

## 2016-02-17 NOTE — Progress Notes (Signed)
Patient Name: Lance [name] Date of Encounter: 02/17/2016  Primary Cardiologist: [facility] Problem List     Principal Problem:   Acute CHF (congestive heart failure) (HCC) Active Problems:   Multiple sclerosis (HCC)   Sacral decubitus ulcer   Normocytic anemia   Spastic quadriplegia (HCC)   Chronic kidney disease, stage 3   Essential hypertension, benign   DM (diabetes mellitus) type II controlled with renal manifestation (HCC)   Recurrent UTI (urinary tract infection)   AKI (acute kidney injury) (HCC)   Elevated troponin   Severe protein-calorie malnutrition (HCC)   Acute pulmonary edema (HCC)     Subjective   Feeling VERY anxious. Would like some medication to make him feel more comfortable. No CP. Breathing is okay  Inpatient Medications    Scheduled Meds: . aspirin EC  81 mg Oral Daily  . carvedilol  3.125 mg Oral BID WC  . chlorhexidine  15 mL Mouth Rinse BID  . collagenase   Topical Daily  . DAPTOmycin (CUBICIN)  IV  8 mg/kg Intravenous Q48H  . enoxaparin (LOVENOX) injection  30 mg Subcutaneous Q24H  . ferumoxytol  510 mg Intravenous Weekly  . insulin aspart  0-5 Units Subcutaneous QHS  . insulin aspart  0-9 Units Subcutaneous TID WC  . insulin detemir  10 Units Subcutaneous Daily  . mouth rinse  15 mL Mouth Rinse q12n4p  . multivitamin with minerals  1 tablet Oral Daily  . oxybutynin  5 mg Oral BID  . protein supplement shake  11 oz Oral BID BM   Continuous Infusions:   PRN Meds: acetaminophen **OR** acetaminophen, bisacodyl, HYDROcodone-acetaminophen, ondansetron **OR** ondansetron (ZOFRAN) IV   Vital Signs    Vitals:   02/16/16 0432 02/16/16 1719 02/16/16 2052 02/17/16 0454  BP: (!) 142/85 (!) 151/83 (!) 155/89 (!) 159/85  Pulse: (!) 106 97 92 96  Resp: 20  18 18   Temp: 97.9 F (36.6 C) 98.1 F (36.7 C) 97.8 F (36.6 C) 97.7 F (36.5 C)  TempSrc: Oral Oral Oral Oral  SpO2: 93% 94% 94% 91%  Weight: 187 lb 6.3 oz (85 kg)   186 lb  11.7 oz (84.7 kg)  Height:        Intake/Output Summary (Last 24 hours) at 02/17/16 0914 Last data filed at 02/17/16 0800  Gross per 24 hour  Intake              410 ml  Output              825 ml  Net             -415 ml   Filed Weights   02/15/16 0544 02/16/16 0432 02/17/16 0454  Weight: 186 lb 4.6 oz (84.5 kg) 187 lb 6.3 oz (85 kg) 186 lb 11.7 oz (84.7 kg)    Physical Exam   GEN: chronically ill appearing, frail HEENT: Grossly normal.  Neck: Supple, no JVD, carotid bruits, or masses. Cardiac: RRR, no murmurs, rubs, or gallops. No clubbing, cyanosis, edema.  Radials/DP/PT 2+ and equal bilaterally.  Respiratory:  Respirations regular and unlabored, clear to auscultation bilaterally. GI: Soft, nontender, nondistended, BS + x 4. MS: quadriplegic with muscle atrophy Skin: warm and dry, no rash. Neuro: . Alert and oriented x3.  Psych: AAOx3.  Normal affect.  Labs    CBC  Recent Labs  02/15/16 0532 02/17/16 0352  WBC 11.1* 12.6*  NEUTROABS 8.1*  --   HGB 9.4* 9.8*  HCT 27.3* 29.4*  MCV 87.8 89.9  PLT 194 622   Basic Metabolic Panel  Recent Labs  02/16/16 0532 02/17/16 0352  NA 137 140  K 5.1 5.6*  CL 108 107  CO2 19* 21*  GLUCOSE 183* 169*  BUN 105* 116*  CREATININE 4.02* 4.15*  CALCIUM 8.4* 8.9  PHOS 7.4* 9.2*   Liver Function Tests  Recent Labs  02/16/16 0532 02/17/16 0352  ALBUMIN 2.5* 2.6*   No results for input(s): LIPASE, AMYLASE in the last 72 hours. Cardiac Enzymes  Recent Labs  02/14/16 1117 02/15/16 0532 02/15/16 1428  CKTOTAL  --   --  33*  TROPONINI 0.19* 0.23*  --    BNP Invalid input(s): POCBNP D-Dimer No results for input(s): DDIMER in the last 72 hours. Hemoglobin A1C  Recent Labs  02/14/16 1117  HGBA1C 6.0*   Fasting Lipid Panel No results for input(s): CHOL, HDL, LDLCALC, TRIG, CHOLHDL, LDLDIRECT in the last 72 hours. Thyroid Function Tests No results for input(s): TSH, T4TOTAL, T3FREE, THYROIDAB in the last 72  hours.  Invalid input(s): FREET3  Telemetry    NSR with brief run of SVT - Personally Reviewed  ECG     Sinus tachy HR 100 - Personally Reviewed  Radiology    No results found.  Cardiac Studies   2D ECHO: 02/14/16 Study Conclusions - Left ventricle: The cavity size was normal. Wall thickness was normal. Systolic function was moderately reduced. The estimated ejection fraction was in the range of 35% to 40%. Moderate diffuse hypokinesis with no identifiable regional variations. Doppler parameters are consistent with abnormal left ventricular relaxation (grade 1 diastolic dysfunction). Indeterminate ventricular filling pressure. Acoustic contrast opacification revealed no evidence ofthrombus. - Mitral valve: Mildly calcified annulus. Mildly thickened leaflets . Mild myxomatous degeneration. Systolic bowing without prolapse. There was mild to moderate regurgitation directed centrally. - Left atrium: The atrium was mildly dilated. - Pericardium, extracardiac: A trivial pericardial effusion was identified. There was a left pleural effusion.  Patient Profile   75 yr old male with PMH significant for multiple sclerosis (diagnosed in early 30's)with quadriplegia, chronic decubitus ulcer, anemia, osteomyelitis, CKD stage III, HTN, IDDM, recurrent UTI's who presented with new onset of dyspnea and found to have newly depressed LV systolic function, EF 29-79% (no regional wall motion abnormalities, EF previously 50-55% 09/02/14) and acute on chronic renal failure.  Assessment & Plan    1. Acute systolic heart failure/cardiomyopathy: Given that there are no regional wall motion abnormalities, this may be nonischemic in etiology. ECG shows sinus tachycardia with nonspecific T wave abnormalities in high lateral leads. Troponin elevation is minimal and flat consistent with demand ischemia. Would not pursue coronary angiography given acute on chronic renal failure. Cannot  use ACEI's/ARB's. Will continue carvedilol 3.125 mg BID as he has hypertension as well. Will increase this to 6.25mg  BID Given worsening renal failure,  IV Lasix was discontinued on 10/14. Will let nephrology guide diuresis, plan is for PRN at this time.   2. New cardiomyopathy, EF 35-40%: Given that there are no regional wall motion abnormalities, this may be nonischemic in etiology. ECG shows sinus tachycardia with nonspecific T wave abnormalities in high lateral leads. Troponin elevation is minimal and flat consistent with demand ischemia. Would not pursue coronary angiography given acute on chronic renal failure.  Continue ASA and statin. Outpatient nuclear stress test to assess for an ischemic etiology recommended by Dr. Gabriela Eves. However, Dr. Baxter Flattery recommended possible inpatient stress testing. At this point, I don't think it will change  management since he is not a cath candidate, but we could consider as an inpatient at a later date. Will defer to Dr. Marlou Porch. LVEF will need to be reassessed in several months to check for interval improvement.  3. HTN: currently on Coreg 3.125 mg BID. Will increase to 6.25 mg BID given HTN and CM. If he remains hypertensive would add hydralazine nitrate combo.   4. Acute on chronic renal failure: Given worsening renal failure, he may require renal biopsy and dialysis. Additional recommendations as per nephrology.  If loss of kidney function continues next 24 hours will require transfer to [facility] for dialysis and a renal biopsy (all of this discussed with patient, sons and wife and he is agreeable to the plan)  5. R ischial ulcer with osteomyelitis by MRi: ID starting daptomycin   6. Anxiety: discussed with Dr. Wynetta Emery. I will start Xanax 0.25mg  TID as needed for anxiety.   Signed, Angelena Form, PA-C  02/17/2016, 9:14 AM   Personally seen and examined. Agree with above. Anxious, frail, Lungs clear. AKI worsening. Off Lasix Increased coreg to  6.25 BID Would not be unreasonable to perform NUC stress when AKI is improving later in this admission.   Candee Furbish, MD

## 2016-02-18 ENCOUNTER — Inpatient Hospital Stay (HOSPITAL_COMMUNITY): Payer: Medicare Other

## 2016-02-18 DIAGNOSIS — M86151 Other acute osteomyelitis, right femur: Secondary | ICD-10-CM

## 2016-02-18 DIAGNOSIS — I420 Dilated cardiomyopathy: Secondary | ICD-10-CM

## 2016-02-18 DIAGNOSIS — L89310 Pressure ulcer of right buttock, unstageable: Secondary | ICD-10-CM

## 2016-02-18 DIAGNOSIS — F411 Generalized anxiety disorder: Secondary | ICD-10-CM

## 2016-02-18 LAB — CBC WITH DIFFERENTIAL/PLATELET
Basophils Absolute: 0 10*3/uL (ref 0.0–0.1)
Basophils Relative: 0 %
EOS ABS: 0 10*3/uL (ref 0.0–0.7)
Eosinophils Relative: 0 %
HEMATOCRIT: 29.9 % — AB (ref 39.0–52.0)
HEMOGLOBIN: 9.7 g/dL — AB (ref 13.0–17.0)
LYMPHS ABS: 2.4 10*3/uL (ref 0.7–4.0)
Lymphocytes Relative: 20 %
MCH: 30.4 pg (ref 26.0–34.0)
MCHC: 32.4 g/dL (ref 30.0–36.0)
MCV: 93.7 fL (ref 78.0–100.0)
MONOS PCT: 10 %
Monocytes Absolute: 1.2 10*3/uL — ABNORMAL HIGH (ref 0.1–1.0)
NEUTROS ABS: 8.5 10*3/uL — AB (ref 1.7–7.7)
NEUTROS PCT: 70 %
Platelets: 224 10*3/uL (ref 150–400)
RBC: 3.19 MIL/uL — ABNORMAL LOW (ref 4.22–5.81)
RDW: 17.8 % — ABNORMAL HIGH (ref 11.5–15.5)
WBC: 12.1 10*3/uL — ABNORMAL HIGH (ref 4.0–10.5)

## 2016-02-18 LAB — RENAL FUNCTION PANEL
ALBUMIN: 2.7 g/dL — AB (ref 3.5–5.0)
Albumin: 2.9 g/dL — ABNORMAL LOW (ref 3.5–5.0)
Anion gap: 11 (ref 5–15)
Anion gap: 9 (ref 5–15)
BUN: 70 mg/dL — AB (ref 6–20)
BUN: 47 mg/dL — ABNORMAL HIGH (ref 6–20)
CALCIUM: 8.3 mg/dL — AB (ref 8.9–10.3)
CHLORIDE: 103 mmol/L (ref 101–111)
CO2: 23 mmol/L (ref 22–32)
CO2: 25 mmol/L (ref 22–32)
CREATININE: 2.44 mg/dL — AB (ref 0.61–1.24)
Calcium: 8.4 mg/dL — ABNORMAL LOW (ref 8.9–10.3)
Chloride: 105 mmol/L (ref 101–111)
Creatinine, Ser: 1.99 mg/dL — ABNORMAL HIGH (ref 0.61–1.24)
GFR calc Af Amer: 28 mL/min — ABNORMAL LOW (ref >60)
GFR calc non Af Amer: 25 mL/min — ABNORMAL LOW (ref >60)
GFR, EST AFRICAN AMERICAN: 36 mL/min — AB (ref >60)
GFR, EST NON AFRICAN AMERICAN: 31 mL/min — AB (ref >60)
GLUCOSE: 115 mg/dL — AB (ref 65–99)
Glucose, Bld: 126 mg/dL — ABNORMAL HIGH (ref 65–99)
PHOSPHORUS: 4.3 mg/dL (ref 2.5–4.6)
POTASSIUM: 4.7 mmol/L (ref 3.5–5.1)
Phosphorus: 3.6 mg/dL (ref 2.5–4.6)
Potassium: 4.6 mmol/L (ref 3.5–5.1)
SODIUM: 139 mmol/L (ref 135–145)
Sodium: 137 mmol/L (ref 135–145)

## 2016-02-18 LAB — KAPPA/LAMBDA LIGHT CHAINS
Kappa free light chain: 528 mg/L — ABNORMAL HIGH (ref 3.3–19.4)
Kappa, lambda light chain ratio: 1.68 — ABNORMAL HIGH (ref 0.26–1.65)
Lambda free light chains: 313.5 mg/L — ABNORMAL HIGH (ref 5.7–26.3)

## 2016-02-18 LAB — POCT ACTIVATED CLOTTING TIME
ACTIVATED CLOTTING TIME: 169 s
ACTIVATED CLOTTING TIME: 169 s
ACTIVATED CLOTTING TIME: 169 s
ACTIVATED CLOTTING TIME: 175 s
ACTIVATED CLOTTING TIME: 186 s
Activated Clotting Time: 175 seconds
Activated Clotting Time: 191 seconds
Activated Clotting Time: 197 seconds
Activated Clotting Time: 191 seconds
Activated Clotting Time: 180 seconds
Activated Clotting Time: 180 seconds
Activated Clotting Time: 180 seconds

## 2016-02-18 LAB — GLUCOSE, CAPILLARY
GLUCOSE-CAPILLARY: 95 mg/dL (ref 65–99)
Glucose-Capillary: 114 mg/dL — ABNORMAL HIGH (ref 65–99)
Glucose-Capillary: 120 mg/dL — ABNORMAL HIGH (ref 65–99)
Glucose-Capillary: 113 mg/dL — ABNORMAL HIGH (ref 65–99)

## 2016-02-18 LAB — IMMUNOFIXATION ELECTROPHORESIS
IGA: 1327 mg/dL — AB (ref 61–437)
IGG (IMMUNOGLOBIN G), SERUM: 1651 mg/dL — AB (ref 700–1600)
IGM, SERUM: 20 mg/dL (ref 15–143)
Total Protein ELP: 6.8 g/dL (ref 6.0–8.5)

## 2016-02-18 LAB — MAGNESIUM: Magnesium: 2.7 mg/dL — ABNORMAL HIGH (ref 1.7–2.4)

## 2016-02-18 LAB — APTT: aPTT: 65 seconds — ABNORMAL HIGH (ref 24–36)

## 2016-02-18 MED ORDER — SODIUM CHLORIDE 0.9 % IV SOLN
500.0000 mg | INTRAVENOUS | Status: DC
  Administered 2016-02-19 – 2016-02-20 (×2): 500 mg via INTRAVENOUS
  Filled 2016-02-18 (×2): qty 10

## 2016-02-18 MED ORDER — ENSURE ENLIVE PO LIQD
237.0000 mL | Freq: Two times a day (BID) | ORAL | Status: DC
  Administered 2016-02-19 – 2016-02-21 (×4): 237 mL via ORAL

## 2016-02-18 MED ORDER — PREMIER PROTEIN SHAKE
11.0000 [oz_av] | Freq: Two times a day (BID) | ORAL | Status: DC
  Administered 2016-02-18 – 2016-02-19 (×3): 11 [oz_av] via ORAL
  Filled 2016-02-18 (×5): qty 325.31

## 2016-02-18 NOTE — Progress Notes (Signed)
Spoke with Dr Jonnie Finner regarding bleeding from HD Cath site.  Dr Jonnie Finner instructed me to decrease his heparin infusion to 1000 U per hour for three hours, if the bleeding continues will then decrease heparin to 500 U per hour.  Continue to get ACT levels every two hours until bleeding has stopped.  Heparin levels will then need to be readjusted after 1700 assuming bleeding has stopped. Glenda Chroman RN

## 2016-02-18 NOTE — Progress Notes (Addendum)
[facility] Rounding Note  Subjective: stable this am, still SOB , just taken off bipap.  CXR this am 60% better pulm edema, still wet though.  Net neg 3 L since starting crrt.  PTT up to 65   Objective Vital signs in last 24 hours: Vitals:   02/18/16 0600 02/18/16 0700 02/18/16 0757 02/18/16 0800  BP: 128/63 137/61  (!) 132/59  Pulse: 87 87  86  Resp: (!) 22 (!) 23  (!) 22  Temp:    98.1 F (36.7 C)  TempSrc:    Axillary  SpO2: 95% 95% 95% 94%  Weight:      Height:       Weight change: -0.7 kg (-1 lb 8.7 oz)  Intake/Output Summary (Last 24 hours) at 02/18/16 0824 Last data filed at 02/18/16 0800  Gross per 24 hour  Intake           203.52 ml  Output             3634 ml  Net         -3430.48 ml   Physical Exam:  Blood pressure (!) 132/59, pulse 86, temperature 98.1 F (36.7 C), temperature source Axillary, resp. rate (!) 22, height 6\' 4"  (1.93 m), weight 84 kg (185 lb 3 oz), SpO2 94 %. Gen: Tall thin WM, alert, whispervoice VS as noted, color is better No JVD Mild rales bilat bases, no wheezing Cardiac rhythm is regular.  S1S2 No S3.  Soft apical pansystolic murmur.  No diastolic murmur or rub. Abdomen protuberant.  Bruising from insulin injection. No focal tenderness. Ext in sheepskin lined immobilizing splint devices - trace pretibial edema only Neuro: barely awake, nods head and answers appropriately  Background:  75 y.o. year-old with PMH significant for DM2, HTN, multiple sclerosis, spastic quadriplegia, urinary retention, h/o decubitus/ischial ulcers, baseline CKD. Recently hospitalized (9/29-10/4) with R ischial pressure ulcer, that was debrided, treated with 6 days of vanco and zosyn (for this plus a klebsiella and strep UTI) Discharged  10/4 (with a creatinine of 1.82) to finish 4 more days of bactrim. Was brought back to the hospital from  [facility] (there for rehab) with new onset dyspnea, and hypoxemia. CXR in the ED showed pulmonary edema, creatinine was noted to be  elevated at 3.3 and he was admitted. BNP elevated >900, troponins elevated.   ECHO LV EF 35% - 40% (down from 50-55% 09/2014), moderate diffuse hypokinesis, mild to moderate MR and Grade 1 diastolic dysfunction.  Assessment: 1. AKI on CKD 3 - unclear cause, +proteinuria, microhematuria. Serologies negative so far except ASO +.  Could have ICGN from infection. Started on HD yest w CRRT.   2. Pulm edema/ vol ^^/ acute resp failure - improving  3. Hyperkalemia- resolved 4. CKD3 - baseline creat 1.8 5. CM EF 35-40% 6. Anemia - Fe def 7. R ischial osteomyelitis - ID started him on daptomycin 8. IDDM  Plan - cont CRRT , dec UF to -100 to 200/ hr, cont hep, watch daily PTT to avoid systemic anticoag, dc lovenox SQ  [name] MD [facility] pgr [phone]   02/18/2016, 8:24 AM  Recent Labs Lab 02/14/16 0020 02/14/16 1117 02/15/16 0532 02/16/16 0532 02/17/16 0352 02/17/16 1740 02/18/16 0535  NA 137 136 139 137 140 136 139  K 4.5 4.8 5.6* 5.1 5.6* 5.1 4.6  CL 112* 108 110 108 107 105 105  CO2 17* 19* 18* 19* 21* 20* 23  GLUCOSE 143* 250* 181* 183* 169*  204* 115*  BUN 90* 95* 105* 105* 116* 119* 70*  CREATININE 3.30* 3.51* 3.71* 4.02* 4.15* 4.06* 2.44*  CALCIUM 8.4* 8.6* 8.9 8.4* 8.9 8.4* 8.3*  PHOS  --   --  6.2* 7.4* 9.2* 9.6* 4.3     Recent Labs Lab 02/14/16 0020  02/17/16 0352 02/17/16 1740 02/18/16 0535  AST 18  --   --   --   --   ALT 15*  --   --   --   --   ALKPHOS 68  --   --   --   --   BILITOT 0.8  --   --   --   --   PROT 7.0  --   --   --   --   ALBUMIN 2.5*  < > 2.6* 2.5* 2.7*  < > = values in this interval not displayed.   Recent Labs Lab 02/14/16 0020 02/14/16 1117 02/15/16 0532 02/17/16 0352  WBC 14.2* 11.0* 11.1* 12.6*  NEUTROABS 11.1*  --  8.1*  --   HGB 9.7* 9.8* 9.4* 9.8*  HCT 28.2* 29.5* 27.3* 29.4*  MCV 87.0 90.2 87.8 89.9  PLT 224 221 194 202     Recent Labs Lab 02/14/16 0020 02/14/16 1117 02/15/16 0532  02/15/16 1428  CKTOTAL  --   --   --  33*  TROPONINI 0.15* 0.19* 0.23*  --    CBG:  Recent Labs Lab 02/17/16 0807 02/17/16 1136 02/17/16 1824 02/17/16 2151 02/18/16 0745  GLUCAP 194* 202* 201* 126* 114*    Iron/TIBC/Ferritin/ %Sat    Component Value Date/Time   IRON 28 (L) 02/15/2016 0532   TIBC 193 (L) 02/15/2016 0532   FERRITIN 316 08/31/2014 0036   IRONPCTSAT 14 (L) 02/15/2016 0532   Studies/Results: Dg Chest Port 1 View  Result Date: 02/17/2016 CLINICAL DATA:  Acute congestive heart failure. Acute renal failure. Worsening shortness of breath and hypoxia. EXAM: PORTABLE CHEST 1 VIEW COMPARISON:  02/14/2016 FINDINGS: Worsening diffuse symmetric bilateral airspace disease is seen, most likely due to worsening pulmonary edema. Bilateral pleural effusions are again noted. No pneumothorax visualized. Heart size is stable. IMPRESSION: Worsening diffuse symmetric airspace disease, most likely due ago pulmonary edema. Bilateral pleural effusions again noted. Electronically Signed   By: Earle Gell M.D.   On: 02/17/2016 14:15   Medications: . heparin 10,000 units/ 20 mL infusion syringe 1,400 Units/hr (02/18/16 0459)  . dialysis replacement fluid (prismasate) 400 mL/hr at 02/18/16 0611  . dialysis replacement fluid (prismasate) 200 mL/hr at 02/17/16 1826  . dialysate (PRISMASATE) 2,000 mL/hr at 02/18/16 0410   . aspirin EC  81 mg Oral Daily  . carvedilol  3.125 mg Oral BID WC  . chlorhexidine  15 mL Mouth Rinse BID  . collagenase   Topical Daily  . DAPTOmycin (CUBICIN)  IV  8 mg/kg Intravenous Q48H  . enoxaparin (LOVENOX) injection  30 mg Subcutaneous Q24H  . feeding supplement (ENSURE ENLIVE)  237 mL Oral BID BM  . ferumoxytol  510 mg Intravenous Weekly  . insulin aspart  0-5 Units Subcutaneous QHS  . insulin aspart  0-9 Units Subcutaneous TID WC  . insulin detemir  10 Units Subcutaneous Daily  . mouth rinse  15 mL Mouth Rinse q12n4p  . multivitamin with minerals  1 tablet  Oral Daily  . oxybutynin  5 mg Oral BID  . protein supplement shake  11 oz Oral BID BM    Background:  75 y.o. year-old with PMH significant  for DM2, HTN, multiple sclerosis, spastic quadriplegia, urinary retention, h/o decubitus/ischial ulcers, baseline CKD. Recently hospitalized (9/29-10/4) with R ischial pressure ulcer, that was debrided, treated with 6 days of vanco and zosyn (for this plus a klebsiella and strep UTI) Discharged  10/4 (with a creatinine of 1.82) to finish 4 more days of bactrim. Was brought back to the hospital from  [facility] (there for rehab) with new onset dyspnea, and hypoxemia. CXR in the ED showed pulmonary edema, creatinine was noted to be elevated at 3.3 and he was admitted. BNP elevated >900, troponins elevated.   ECHO LV EF 35% - 40% (down from 50-55% 09/2014), moderate diffuse hypokinesis, mild to moderate MR and Grade 1 diastolic dysfunction. We were asked to see re his AKI on CKD.  Assessment: 9. AKI on CKD 3 - unclear cause, +proteinuria, microhematuria. Serologies negative so far except ASO +.  Could have ICGN from infection. Need dialysis (^BUN/Cr, worsening pulm edema). Plan place temp HD cath and do HD.  10. Hyperkalemia 11. CKD3 - baseline creat 1.8 12. Pulm edema - due to vol overload/ renal failure, hx CM EF 35-40%, much worse today 13. Anemia - Fe def 14. R ischial osteomyelitis - ID started him on daptomycin 15. IDDM  Plan - RR called, move to ICU, get Bipap on and then will place temp cath; if deteriorates will need intubation. HD at [facility] if stabilizes to transfer, vs. CRRT here, get vol down either way.    [name] MD [employer] pgr [phone]   02/18/2016, 8:24 AM

## 2016-02-18 NOTE — Progress Notes (Signed)
Patient Name: Lance [name] Date of Encounter: 02/18/2016  Primary Cardiologist: Dr. Cliffton Asters Problem List     Principal Problem:   Acute CHF (congestive heart failure) ([place]) Active Problems:   Multiple sclerosis (HCC)   Sacral decubitus ulcer   Normocytic anemia   Acute renal failure (ARF) (HCC)   Spastic quadriplegia (HCC)   Chronic kidney disease, stage 3   Essential hypertension, benign   Acute osteomyelitis, pelvis, right (HCC)   Decubitus ulcer of right ischial area   DM (diabetes mellitus) type II controlled with renal manifestation (HCC)   Recurrent UTI (urinary tract infection)   AKI (acute kidney injury) (HCC)   Depression   Elevated troponin   Severe protein-calorie malnutrition (HCC)   Acute pulmonary edema (HCC)   Hypoxia   Decubitus ulcer of trochanter, stage 4 (HCC)   Generalized anxiety disorder   Cardiomyopathy ([place])     Subjective   Feeling okay. Xanax has helped. Breathing better since starting CRRT and on venti mask.  Inpatient Medications    Scheduled Meds: . aspirin EC  81 mg Oral Daily  . carvedilol  3.125 mg Oral BID WC  . chlorhexidine  15 mL Mouth Rinse BID  . collagenase   Topical Daily  . DAPTOmycin (CUBICIN)  IV  8 mg/kg Intravenous Q48H  . enoxaparin (LOVENOX) injection  30 mg Subcutaneous Q24H  . feeding supplement (ENSURE ENLIVE)  237 mL Oral BID BM  . ferumoxytol  510 mg Intravenous Weekly  . insulin aspart  0-5 Units Subcutaneous QHS  . insulin aspart  0-9 Units Subcutaneous TID WC  . insulin detemir  10 Units Subcutaneous Daily  . mouth rinse  15 mL Mouth Rinse q12n4p  . multivitamin with minerals  1 tablet Oral Daily  . oxybutynin  5 mg Oral BID  . protein supplement shake  11 oz Oral BID BM   Continuous Infusions: . heparin 10,000 units/ 20 mL infusion syringe 1,400 Units/hr (02/18/16 0459)  . dialysis replacement fluid (prismasate) 400 mL/hr at 02/17/16 1825  . dialysis replacement fluid (prismasate)  200 mL/hr at 02/17/16 1826  . dialysate (PRISMASATE) 2,000 mL/hr at 02/18/16 0410   PRN Meds: acetaminophen **OR** acetaminophen, ALPRAZolam, alteplase, bisacodyl, heparin, heparin, HYDROcodone-acetaminophen, ondansetron **OR** ondansetron (ZOFRAN) IV, sodium chloride   Vital Signs    Vitals:   02/18/16 0400 02/18/16 0500 02/18/16 0600 02/18/16 0700  BP: 132/63 137/61 128/63 137/61  Pulse: 80 85 87 87  Resp: (!) 21 (!) 21 (!) 22 (!) 23  Temp: 98 F (36.7 C)     TempSrc: Axillary     SpO2: 96% 95% 95% 95%  Weight:      Height:        Intake/Output Summary (Last 24 hours) at 02/18/16 0752 Last data filed at 02/18/16 0700  Gross per 24 hour  Intake           343.52 ml  Output             3374 ml  Net         -3030.48 ml   Filed Weights   02/16/16 0432 02/17/16 0454 02/17/16 1645  Weight: 187 lb 6.3 oz (85 kg) 186 lb 11.7 oz (84.7 kg) 185 lb 3 oz (84 kg)    Physical Exam   GEN: chronically ill appearing, frail, on CRRT and venti mask  HEENT: Grossly normal.  Neck: Supple, no JVD, carotid bruits, or masses. Cardiac: RRR, no murmurs, rubs, or gallops. No clubbing,  cyanosis, edema.  Radials/DP/PT 2+ and equal bilaterally.  Respiratory:  Respirations regular and unlabored, clear to auscultation bilaterally. GI: Soft, nontender, nondistended, BS + x 4. MS: quadriplegic with muscle atrophy Skin: warm and dry, no rash. Neuro: . Alert and oriented x3.  Psych: AAOx3.  Normal affect.  Labs    CBC  Recent Labs  02/17/16 0352  WBC 12.6*  HGB 9.8*  HCT 29.4*  MCV 89.9  PLT 782   Basic Metabolic Panel  Recent Labs  02/17/16 1740 02/18/16 0535  NA 136 139  K 5.1 4.6  CL 105 105  CO2 20* 23  GLUCOSE 204* 115*  BUN 119* 70*  CREATININE 4.06* 2.44*  CALCIUM 8.4* 8.3*  MG  --  2.7*  PHOS 9.6* 4.3   Liver Function Tests  Recent Labs  02/17/16 1740 02/18/16 0535  ALBUMIN 2.5* 2.7*   No results for input(s): LIPASE, AMYLASE in the last 72 hours. Cardiac  Enzymes  Recent Labs  02/15/16 1428  CKTOTAL 33*     Telemetry    NSR with some PACs - Personally Reviewed  ECG     Sinus tachy HR 100 - Personally Reviewed  Radiology    Dg Chest Port 1 View  Result Date: 02/17/2016 CLINICAL DATA:  Acute congestive heart failure. Acute renal failure. Worsening shortness of breath and hypoxia. EXAM: PORTABLE CHEST 1 VIEW COMPARISON:  02/14/2016 FINDINGS: Worsening diffuse symmetric bilateral airspace disease is seen, most likely due to worsening pulmonary edema. Bilateral pleural effusions are again noted. No pneumothorax visualized. Heart size is stable. IMPRESSION: Worsening diffuse symmetric airspace disease, most likely due ago pulmonary edema. Bilateral pleural effusions again noted. Electronically Signed   By: Earle Gell M.D.   On: 02/17/2016 14:15    Cardiac Studies   2D ECHO: 02/14/16 Study Conclusions - Left ventricle: The cavity size was normal. Wall thickness was normal. Systolic function was moderately reduced. The estimated ejection fraction was in the range of 35% to 40%. Moderate diffuse hypokinesis with no identifiable regional variations. Doppler parameters are consistent with abnormal left ventricular relaxation (grade 1 diastolic dysfunction). Indeterminate ventricular filling pressure. Acoustic contrast opacification revealed no evidence ofthrombus. - Mitral valve: Mildly calcified annulus. Mildly thickened leaflets . Mild myxomatous degeneration. Systolic bowing without prolapse. There was mild to moderate regurgitation directed centrally. - Left atrium: The atrium was mildly dilated. - Pericardium, extracardiac: A trivial pericardial effusion was identified. There was a left pleural effusion.  Patient Profile   75 yr old male with PMH significant for multiple sclerosis (diagnosed in early 30's)with quadriplegia, chronic decubitus ulcer, anemia, osteomyelitis, CKD stage III, HTN, IDDM, recurrent  UTI's who presented with new onset of dyspnea and found to have newly depressed LV systolic function, EF 95-62% (no regional wall motion abnormalities, EF previously 50-55% 09/02/14) and acute on chronic renal failure. He developed acute respiratory failure yesterday requiring BIPAP and was transferred to ICU where he received CRRT.   Assessment & Plan    1. Acute systolic heart failure/cardiomyopathy: Given that there are no regional wall motion abnormalities, this may be nonischemic in etiology. ECG shows sinus tachycardia with nonspecific T wave abnormalities in high lateral leads. Troponin elevation is minimal and flat consistent with demand ischemia. Would not pursue coronary angiography given acute on chronic renal failure. Cannot use ACEI's/ARB's. Will continue carvedilol 3.125 mg BID as he has hypertension as well. Given worsening renal failure,  IV Lasix was discontinued on 10/14. He had severe pulmonary edema last night with  acute respiratory failure requiring BIPAP and transfer to ICU. Now receiving CRRT with improvement in respiratory status. Will defer to nephrology for volume managemnt  2. New cardiomyopathy, EF 35-40%: Given that there are no regional wall motion abnormalities, this may be nonischemic in etiology. ECG shows sinus tachycardia with nonspecific T wave abnormalities in high lateral leads. Troponin elevation is minimal and flat consistent with demand ischemia. Would not pursue coronary angiography given acute on chronic renal failure.  Continue ASA and statin. Plan for inpatient nuclear stress test later this admission or defer to outpatient setting. LVEF will need to be reassessed in several months to check for interval improvement.  3. HTN: BP currently well controlled   4. Acute on chronic renal failure: now s/p CRRT given worsening BUN/creat and pulmonary edema. Creat improved to 2.4  5. R ischial ulcer with osteomyelitis by MRi: ID starting daptomycin   6. Anxiety:  continue xanax.   Signed, Angelena Form, PA-C  02/18/2016, 7:52 AM   Personally seen and examined. Agree with above.  Now CRRT. BP stable. If decreases may need to stop Coreg 3.125. Continue for now.  Pulling off volume.  Anxious. FM O2.  Candee Furbish, MD

## 2016-02-18 NOTE — Progress Notes (Addendum)
2200. CRRT filter clotted. Unable to return blood. Filter changed. Therapy off for approximately 45 minutes during filter change. Will continue to monitor.

## 2016-02-18 NOTE — Progress Notes (Signed)
Pharmacy Antibiotic Note  Lance [name] is a 75 y.o. male admitted on 02/13/2016 with new onset dyspnea, and found to have severe bilateral pulmonary edema.  He was continued on Daptomycin started on 10/14 for MRSA osteomyelitis.  CRRT- CVVHDF was started on 10/16.  Pharmacy has been consulted for antibiotic renal dosing.  Renal function, 10/17: - SCr improved (4.06 > 2.44) with CrCl ~ 31 ml/min CG - Continues on CVVHDF, continuous, no off times documented - Effluent rate:  ave 32 ml/kg/hr over the last 12 and 24 hrs. - Essentially no residual UOP - Weight change: 85 kg (10/15) 84 kg (10/16), 83 kg (10/17)  Plan:  Increase Daptomycin to 6 mg/kg (total weight) IV q24h   Height: 6\' 4"  (193 cm) Weight: 185 lb 3 oz (84 kg) IBW/kg (Calculated) : 86.8  Temp (24hrs), Avg:97.4 F (36.3 C), Min:96.1 F (35.6 C), Max:98.1 F (36.7 C)   Recent Labs Lab 02/14/16 0020 02/14/16 0220 02/14/16 1117 02/15/16 0532 02/16/16 0532 02/17/16 0352 02/17/16 1740 02/18/16 0535  WBC 14.2*  --  11.0* 11.1*  --  12.6*  --  12.1*  CREATININE 3.30*  --  3.51* 3.71* 4.02* 4.15* 4.06* 2.44*  LATICACIDVEN  --  0.7  --   --   --   --   --   --     Estimated Creatinine Clearance: 31.6 mL/min (by C-G formula based on SCr of 2.44 mg/dL (H)).    No Known Allergies  Antimicrobials this admission: Daptomycin 10/14 >> (6 weeks)  Dose adjustments this admission:  Microbiology results: 10/14 BCx: ngtd  Thank you for allowing pharmacy to be a part of this patient's care.  Gretta Arab PharmD, BCPS Pager [phone] 02/18/2016 2:02 PM

## 2016-02-18 NOTE — Progress Notes (Signed)
CRITICAL CARE PROGRESS NOTE    Lance [name]  QRF:[id number]  [name]: 1940/10/05  DOA: 02/13/2016 PCP: [name], MD  Hospital course: The patient is a 75 y.o. year-old with PMH significant for DM2, HTN, multiple sclerosis, spastic quadriplegia, urinary retention, h/o decubitus ulcers/ischial osteo Left (2016 with MRSA bacteremia treated with 7 weeks of vancomycin).  He was recently hospitalized (9/29-10/4) because of a wound associated with a R ischial pressure ulcer, that was debrided at bedside, treated with 6 days of vanco and zosyn ( for this plus a klebsiella and strep UTI) He was discharged on 10/4 (with a creatinine of 1.82.) to finish 4 more days of bactrim.  He was brought back to the hospital from [facility] with new onset dyspnea, and hypoxemia. CXR in the ED showed pulmonary edema, creatinine was noted to be elevated at 3.3 and he was admitted. BNP elevated >900, troponins elevated.  Assessment & Plan:   Acute Respiratory Failure-As noted, patient acutely decompensated 10/16 and required bipap therapy and transfer to ICU, nephrology started on CRRT in ICU, pt tolerating well and much improved respiratory status; vitals stabilized.  Continue current therapy.  Renal team to decide about transfer to [facility].  Repeat portable CXR this morning.   Cardiorenal syndrome/Acute systolic Heart Failure/Cardiomyopathy   Baseline Cr 1.7.  Baseline EF 50%, no previous CHF flare.  Unclear precipitating factor.  Appreciate cardiology and nephrology team assistance.  ECHO LV EF 35% - 40% (down from 50-55% 09/2014), moderate diffuse hypokinesis, mild to moderate MR and Grade 1 diastolic dysfunction.  Not diuresing well and increasing O2 requirement, check repeat CXR.  Received lasix 80 mg IV 10/15 and did not diurese much.    Acute on Chronic Renal Failure (CKD3) - Pt started on CRRT in ICU and improving. Await further renal recommendations.    MS with spastic quadruplegia: Air overlay  mattress, frequent turning   Anemia: nephrology team starting aranesp 10/15, received feraheme 10/14.  Leukocytosis:  Likely secondary to right ischial sacral wound infection with osteomyelitis.  Ulcer not festering, but noted that CRP/ESR elevated during last hospitalization (and he had osteomyelitis of the pelvis >1 year ago in the setting of TEE- MRSA bacteremia for which he had 7 weeks of IV vancomycin with ID, Dr. Johnnye Sima)  - He is now on daptomycin for treatment of his osteomyelitis per ID team.   5. IDDM:  -Continue Levemir, lower dose given renal failure -SSI with meals  CBG (last 3)   Recent Labs  02/17/16 1136 02/17/16 1824 02/17/16 2151  GLUCAP 202* 201* 126*   HTN:  Had been hypertensive in the setting of fluid overload and renal failure, but now improving with CRRT   Recurrent UTI with indwelling foley:  Foley last changed before last course of antibiotics. -Continue oxybutynin  Severe protein calorie malnutrition: Continue to encourage supplements, consulted dietitian  Right Ischial Osteomyelitis;  MRI sacrum positive for osteomyelitis, ID consulted, started daptomycin 10/14 per ID Baxter Flattery) will need 6 weeks of therapy : needs weekly CK tests while on daptomycin -Consult WOC, Pt is followed by plastic surgery at [facility] and s/p skin flap surgery  --Pt now on day 3/42 of daptomycin therapy  Generalized Anxiety - alprazolam prn.    Code Status: FULL  Family Communication: bedside update  Disposition Plan: TBD Consults called: nephrology, ICU, ID, cardiology Admission status: INPATIENT, telemetry  Consultants:  Nephrology  Cardiology  ID  PCCM  Procedures:  MRI sacrum  Subjective: Pt on bipap  therapy, tolerating well  Objective: Vitals:   02/18/16 0400 02/18/16 0500 02/18/16 0600 02/18/16 0700  BP: 132/63 137/61 128/63 137/61  Pulse: 80 85 87 87  Resp: (!) 21 (!) 21 (!) 22 (!) 23  Temp: 98 F (36.7 C)       TempSrc: Axillary     SpO2: 96% 95% 95% 95%  Weight:      Height:        Intake/Output Summary (Last 24 hours) at 02/18/16 0720 Last data filed at 02/18/16 0700  Gross per 24 hour  Intake           343.52 ml  Output             3374 ml  Net         -3030.48 ml   Filed Weights   02/16/16 0432 02/17/16 0454 02/17/16 1645  Weight: 85 kg (187 lb 6.3 oz) 84.7 kg (186 lb 11.7 oz) 84 kg (185 lb 3 oz)    Exam:  General exam:  on bipap, Frail debilitated elderly male, chronically ill appearing, in no apparent distress. Respiratory system: crackles heard. No increased work of breathing. Cardiovascular system: S1 & S2 heard.  Gastrointestinal system: Abdomen is nondistended, soft and nontender. Normal bowel sounds heard. Central nervous system: No focal neurological deficits. Extremities: no cyanosis.  Skin: large right ischial stage 4 ulcer to bone unchanged.   Data Reviewed: Basic Metabolic Panel:  Recent Labs Lab 02/15/16 0532 02/16/16 0532 02/17/16 0352 02/17/16 1740 02/18/16 0535  NA 139 137 140 136 139  K 5.6* 5.1 5.6* 5.1 4.6  CL 110 108 107 105 105  CO2 18* 19* 21* 20* 23  GLUCOSE 181* 183* 169* 204* 115*  BUN 105* 105* 116* 119* 70*  CREATININE 3.71* 4.02* 4.15* 4.06* 2.44*  CALCIUM 8.9 8.4* 8.9 8.4* 8.3*  MG  --   --   --   --  2.7*  PHOS 6.2* 7.4* 9.2* 9.6* 4.3   Liver Function Tests:  Recent Labs Lab 02/14/16 0020 02/15/16 0532 02/16/16 0532 02/17/16 0352 02/17/16 1740 02/18/16 0535  AST 18  --   --   --   --   --   ALT 15*  --   --   --   --   --   ALKPHOS 68  --   --   --   --   --   BILITOT 0.8  --   --   --   --   --   PROT 7.0  --   --   --   --   --   ALBUMIN 2.5* 2.6* 2.5* 2.6* 2.5* 2.7*   No results for input(s): LIPASE, AMYLASE in the last 168 hours. No results for input(s): AMMONIA in the last 168 hours. CBC:  Recent Labs Lab 02/14/16 0020 02/14/16 1117 02/15/16 0532 02/17/16 0352  WBC 14.2* 11.0* 11.1* 12.6*  NEUTROABS 11.1*   --  8.1*  --   HGB 9.7* 9.8* 9.4* 9.8*  HCT 28.2* 29.5* 27.3* 29.4*  MCV 87.0 90.2 87.8 89.9  PLT 224 221 194 202   Cardiac Enzymes:  Recent Labs Lab 02/14/16 0020 02/14/16 1117 02/15/16 0532 02/15/16 1428  CKTOTAL  --   --   --  33*  TROPONINI 0.15* 0.19* 0.23*  --    CBG (last 3)   Recent Labs  02/17/16 1136 02/17/16 1824 02/17/16 2151  GLUCAP 202* 201* 126*   Recent Results (from the past 240 hour(s))  Culture, blood (Routine X 2) w Reflex to ID Panel     Status: None (Preliminary result)   Collection Time: 02/15/16  1:00 PM  Result Value Ref Range Status   Specimen Description BLOOD RIGHT ARM  2 ML IN YELLOW  Final   Special Requests NONE  Final   Culture   Final    NO GROWTH 2 DAYS Performed at [facility]    Report Status PENDING  Incomplete  Culture, blood (Routine X 2) w Reflex to ID Panel     Status: None (Preliminary result)   Collection Time: 02/15/16  1:00 PM  Result Value Ref Range Status   Specimen Description BLOOD LEFT ARM  3 ML IN YELLOW  Final   Special Requests NONE  Final   Culture   Final    NO GROWTH 2 DAYS Performed at [facility]    Report Status PENDING  Incomplete    Studies: Dg Chest Port 1 View  Result Date: 02/17/2016 CLINICAL DATA:  Acute congestive heart failure. Acute renal failure. Worsening shortness of breath and hypoxia. EXAM: PORTABLE CHEST 1 VIEW COMPARISON:  02/14/2016 FINDINGS: Worsening diffuse symmetric bilateral airspace disease is seen, most likely due to worsening pulmonary edema. Bilateral pleural effusions are again noted. No pneumothorax visualized. Heart size is stable. IMPRESSION: Worsening diffuse symmetric airspace disease, most likely due ago pulmonary edema. Bilateral pleural effusions again noted. Electronically Signed   By: Earle Gell M.D.   On: 02/17/2016 14:15   Scheduled Meds: . aspirin EC  81 mg Oral Daily  . carvedilol  3.125 mg Oral BID WC  . chlorhexidine  15 mL Mouth Rinse BID  .  collagenase   Topical Daily  . DAPTOmycin (CUBICIN)  IV  8 mg/kg Intravenous Q48H  . enoxaparin (LOVENOX) injection  30 mg Subcutaneous Q24H  . feeding supplement (ENSURE ENLIVE)  237 mL Oral BID BM  . ferumoxytol  510 mg Intravenous Weekly  . insulin aspart  0-5 Units Subcutaneous QHS  . insulin aspart  0-9 Units Subcutaneous TID WC  . insulin detemir  10 Units Subcutaneous Daily  . mouth rinse  15 mL Mouth Rinse q12n4p  . multivitamin with minerals  1 tablet Oral Daily  . oxybutynin  5 mg Oral BID  . protein supplement shake  11 oz Oral BID BM   Continuous Infusions: . heparin 10,000 units/ 20 mL infusion syringe 1,400 Units/hr (02/18/16 0459)  . dialysis replacement fluid (prismasate) 400 mL/hr at 02/17/16 1825  . dialysis replacement fluid (prismasate) 200 mL/hr at 02/17/16 1826  . dialysate (PRISMASATE) 2,000 mL/hr at 02/18/16 0410    Principal Problem:   Acute CHF (congestive heart failure) (HCC) Active Problems:   Multiple sclerosis (HCC)   Sacral decubitus ulcer   Normocytic anemia   Acute renal failure (ARF) (HCC)   Spastic quadriplegia (HCC)   Chronic kidney disease, stage 3   Essential hypertension, benign   Acute osteomyelitis, pelvis, right (HCC)   Decubitus ulcer of right ischial area   DM (diabetes mellitus) type II controlled with renal manifestation (HCC)   Recurrent UTI (urinary tract infection)   AKI (acute kidney injury) (HCC)   Depression   Elevated troponin   Severe protein-calorie malnutrition (HCC)   Acute pulmonary edema (HCC)   Hypoxia   Decubitus ulcer of trochanter, stage 4 (HCC)   Generalized anxiety disorder   Cardiomyopathy [facility])  Critical Care Time spent: 13 mins  Irwin Brakeman, MD, FAAFP [facility] Pager [phone]  If 7PM-7AM, please contact night-coverage [email or web address] Password [email or web address] 02/18/2016, 7:20 AM    LOS: 4 days

## 2016-02-18 NOTE — Progress Notes (Signed)
Pt seen, HR91, RR25, spo2 100% on 55% venturi mask.  Pt appears comfortable, no increased wob or respiratory distress noted or voiced by pt at this time.  Bipap remains in room on standby but not indicated at this time.  RT will continue to monitor pt.

## 2016-02-18 NOTE — Progress Notes (Signed)
Spoke with Dr Jonnie Finner regarding blood clot that has formed around HD Cath site.  Dressing was changed at femoral site and clot was assessed but not removed, gauze dressing was placed over site  per Dr Jonnie Finner.  In addition, heparin was turned off on CRRT.  Dr Jonnie Finner will reassess patient in the morning. Glenda Chroman RN

## 2016-02-18 NOTE — Progress Notes (Signed)
Pt taken off of BIPAP and placed on 30% venti mask.  Tolerating well at this time.

## 2016-02-19 ENCOUNTER — Inpatient Hospital Stay (HOSPITAL_COMMUNITY): Payer: Medicare Other

## 2016-02-19 DIAGNOSIS — N186 End stage renal disease: Secondary | ICD-10-CM

## 2016-02-19 DIAGNOSIS — J9601 Acute respiratory failure with hypoxia: Secondary | ICD-10-CM

## 2016-02-19 DIAGNOSIS — I428 Other cardiomyopathies: Secondary | ICD-10-CM

## 2016-02-19 LAB — CBC WITH DIFFERENTIAL/PLATELET
BASOS ABS: 0 10*3/uL (ref 0.0–0.1)
BASOS PCT: 0 %
BASOS PCT: 0 %
Basophils Absolute: 0 10*3/uL (ref 0.0–0.1)
EOS ABS: 0 10*3/uL (ref 0.0–0.7)
EOS PCT: 0 %
Eosinophils Absolute: 0 10*3/uL (ref 0.0–0.7)
Eosinophils Relative: 0 %
HCT: 27.5 % — ABNORMAL LOW (ref 39.0–52.0)
HEMATOCRIT: 27.6 % — AB (ref 39.0–52.0)
HEMOGLOBIN: 9.3 g/dL — AB (ref 13.0–17.0)
Hemoglobin: 9.2 g/dL — ABNORMAL LOW (ref 13.0–17.0)
Lymphocytes Relative: 26 %
Lymphocytes Relative: 26 %
Lymphs Abs: 2.5 10*3/uL (ref 0.7–4.0)
Lymphs Abs: 2.5 10*3/uL (ref 0.7–4.0)
MCH: 30.5 pg (ref 26.0–34.0)
MCH: 30.3 pg (ref 26.0–34.0)
MCHC: 33.8 g/dL (ref 30.0–36.0)
MCHC: 33.3 g/dL (ref 30.0–36.0)
MCV: 90.2 fL (ref 78.0–100.0)
MCV: 90.8 fL (ref 78.0–100.0)
MONO ABS: 1.2 10*3/uL — AB (ref 0.1–1.0)
MONOS PCT: 12 %
Monocytes Absolute: 1.1 10*3/uL — ABNORMAL HIGH (ref 0.1–1.0)
Monocytes Relative: 11 %
NEUTROS ABS: 5.8 10*3/uL (ref 1.7–7.7)
NEUTROS PCT: 63 %
NEUTROS PCT: 62 %
Neutro Abs: 6 10*3/uL (ref 1.7–7.7)
PLATELETS: 243 10*3/uL (ref 150–400)
PLATELETS: 237 10*3/uL (ref 150–400)
RBC: 3.05 MIL/uL — AB (ref 4.22–5.81)
RBC: 3.04 MIL/uL — ABNORMAL LOW (ref 4.22–5.81)
RDW: 17.5 % — ABNORMAL HIGH (ref 11.5–15.5)
RDW: 17.6 % — AB (ref 11.5–15.5)
WBC: 9.6 10*3/uL (ref 4.0–10.5)
WBC: 9.5 10*3/uL (ref 4.0–10.5)

## 2016-02-19 LAB — RENAL FUNCTION PANEL
ALBUMIN: 2.8 g/dL — AB (ref 3.5–5.0)
ANION GAP: 11 (ref 5–15)
Albumin: 2.7 g/dL — ABNORMAL LOW (ref 3.5–5.0)
Anion gap: 8 (ref 5–15)
BUN: 38 mg/dL — AB (ref 6–20)
BUN: 28 mg/dL — ABNORMAL HIGH (ref 6–20)
CALCIUM: 8.5 mg/dL — AB (ref 8.9–10.3)
CALCIUM: 8.3 mg/dL — AB (ref 8.9–10.3)
CHLORIDE: 103 mmol/L (ref 101–111)
CO2: 27 mmol/L (ref 22–32)
CO2: 25 mmol/L (ref 22–32)
CREATININE: 1.78 mg/dL — AB (ref 0.61–1.24)
CREATININE: 1.57 mg/dL — AB (ref 0.61–1.24)
Chloride: 101 mmol/L (ref 101–111)
GFR calc non Af Amer: 36 mL/min — ABNORMAL LOW (ref >60)
GFR calc non Af Amer: 42 mL/min — ABNORMAL LOW (ref >60)
GFR, EST AFRICAN AMERICAN: 42 mL/min — AB (ref >60)
GFR, EST AFRICAN AMERICAN: 48 mL/min — AB (ref >60)
GLUCOSE: 104 mg/dL — AB (ref 65–99)
GLUCOSE: 167 mg/dL — AB (ref 65–99)
PHOSPHORUS: 2.5 mg/dL (ref 2.5–4.6)
Phosphorus: 3.6 mg/dL (ref 2.5–4.6)
Potassium: 4.5 mmol/L (ref 3.5–5.1)
Potassium: 4.4 mmol/L (ref 3.5–5.1)
SODIUM: 138 mmol/L (ref 135–145)
SODIUM: 137 mmol/L (ref 135–145)

## 2016-02-19 LAB — POCT ACTIVATED CLOTTING TIME
ACTIVATED CLOTTING TIME: 131 s
ACTIVATED CLOTTING TIME: 153 s
ACTIVATED CLOTTING TIME: 147 s
ACTIVATED CLOTTING TIME: 153 s
Activated Clotting Time: 147 seconds
Activated Clotting Time: 147 seconds
Activated Clotting Time: 158 seconds
Activated Clotting Time: 147 seconds

## 2016-02-19 LAB — GLUCOSE, CAPILLARY
GLUCOSE-CAPILLARY: 141 mg/dL — AB (ref 65–99)
GLUCOSE-CAPILLARY: 160 mg/dL — AB (ref 65–99)
Glucose-Capillary: 111 mg/dL — ABNORMAL HIGH (ref 65–99)
Glucose-Capillary: 139 mg/dL — ABNORMAL HIGH (ref 65–99)

## 2016-02-19 LAB — APTT: APTT: 29 s (ref 24–36)

## 2016-02-19 LAB — MAGNESIUM: MAGNESIUM: 2.7 mg/dL — AB (ref 1.7–2.4)

## 2016-02-19 NOTE — Progress Notes (Signed)
Per daughter request. Please contact son, Corene Cornea and daughte, Andris Flurry with all pertinent information about the patient. They are the two main points of contact and will relay information to the rest of the family. Numbers listed in emergency contact information.

## 2016-02-19 NOTE — Progress Notes (Addendum)
[facility] Rounding Note  Subjective: has wet cough, on 4L [place] now, breathing better overall.  CXR continues to imrpove but still abnormal w L > R effusion and mild vasc congestion.   Objective Vital signs in last 24 hours: Vitals:   02/19/16 0712 02/19/16 0725 02/19/16 0755 02/19/16 0800  BP:    (!) 131/48  Pulse: 80 77 83 75  Resp: (!) 22 19 19 20   Temp:    97.5 F (36.4 C)  TempSrc:    Oral  SpO2: 100% 100% 96% 100%  Weight:      Height:       Weight change: -1 kg (-2 lb 3.3 oz)  Intake/Output Summary (Last 24 hours) at 02/19/16 0841 Last data filed at 02/19/16 0800  Gross per 24 hour  Intake              860 ml  Output             4261 ml  Net            -3401 ml   Physical Exam:  Blood pressure (!) 131/48, pulse 75, temperature 97.5 F (36.4 C), temperature source Oral, resp. rate 20, height 6\' 4"  (1.93 m), weight 77.4 kg (170 lb 10.2 oz), SpO2 100 %. Gen: Tall thin WM, alert, stronger voice and color much better No JVD Chest - R base clear, L base diffuse rhonchi Cor - RRR no MRG  Abd - soft ntnd no mass or ascites Ext - no edema Neuro - awake and alert, moves UE's well, LE's weak  Background:  75 y.o. year-old with PMH significant for DM2, HTN, multiple sclerosis, spastic quadriplegia, urinary retention, h/o decubitus/ischial ulcers, baseline CKD. Recently hospitalized (9/29-10/4) with R ischial pressure ulcer, that was debrided, treated with 6 days of vanco and zosyn (for this plus a klebsiella and strep UTI) Discharged  10/4 (with a creatinine of 1.82) to finish 4 more days of bactrim. Was brought back to the hospital from  [facility] (there for rehab) with new onset dyspnea, and hypoxemia. CXR in the ED showed pulmonary edema, creatinine was noted to be elevated at 3.3 and he was admitted. BNP elevated >900, troponins elevated.   ECHO LV EF 35% - 40% (down from 50-55% 09/2014), moderate diffuse hypokinesis, mild to moderate MR and Grade 1 diastolic  dysfunction.  Assessment: 1. AKI on CKD 3 - unclear cause, +proteinuria ~5gm, microhematuria. Serologies negative so far except ASO +.  Could have ICGN from infection. CRRT D # 3 today 2. Pulm edema/ vol ^^/ acute resp failure - improving, has residual L > R effusion (and/or infiltrate) 3. Hyperkalemia- resolved 4. CKD3 - baseline creat 1.8 last admit Oct '17 5. CM EF 35-40% 6. Anemia - Fe def 7. R ischial osteomyelitis - ID started him on daptomycin 8. IDDM  Plan - cont CRRT for volume removal, may be nearing maximal volume reduction; resp situation precarious still, ?PNA or other issues related to MS/ resp muscular capacity, have d/w primary will get CCM input.  Anticipate may be able to dc CRRT in 24 hrs and transition to IHD (at [facility]).    [name] MD [facility] pgr [phone]   02/18/2016, 8:24 AM  Recent Labs Lab 02/15/16 0532 02/16/16 0532 02/17/16 0352 02/17/16 1740 02/18/16 0535 02/18/16 1619 02/19/16 0445  NA 139 137 140 136 139 137 138  K 5.6* 5.1 5.6* 5.1 4.6 4.7 4.5  CL 110 108 107 105 105 103 103  CO2 18* 19* 21* 20* 23 25 27   GLUCOSE 181* 183* 169* 204* 115* 126* 104*  BUN 105* 105* 116* 119* 70* 47* 38*  CREATININE 3.71* 4.02* 4.15* 4.06* 2.44* 1.99* 1.78*  CALCIUM 8.9 8.4* 8.9 8.4* 8.3* 8.4* 8.5*  PHOS 6.2* 7.4* 9.2* 9.6* 4.3 3.6 3.6     Recent Labs Lab 02/14/16 0020  02/18/16 0535 02/18/16 1619 02/19/16 0445  AST 18  --   --   --   --   ALT 15*  --   --   --   --   ALKPHOS 68  --   --   --   --   BILITOT 0.8  --   --   --   --   PROT 7.0  --   --   --   --   ALBUMIN 2.5*  < > 2.7* 2.9* 2.7*  < > = values in this interval not displayed.   Recent Labs Lab 02/15/16 0532 02/17/16 0352 02/18/16 0535 02/19/16 0358 02/19/16 0445  WBC 11.1* 12.6* 12.1* 9.6 9.5  NEUTROABS 8.1*  --  8.5* 6.0 5.8  HGB 9.4* 9.8* 9.7* 9.3* 9.2*  HCT 27.3* 29.4* 29.9* 27.5* 27.6*  MCV 87.8 89.9 93.7 90.2 90.8  PLT 194 202 224 243 237      Recent Labs Lab 02/14/16 0020 02/14/16 1117 02/15/16 0532 02/15/16 1428  CKTOTAL  --   --   --  33*  TROPONINI 0.15* 0.19* 0.23*  --    CBG:  Recent Labs Lab 02/18/16 0745 02/18/16 1155 02/18/16 1605 02/18/16 2211 02/19/16 0758  GLUCAP 114* 120* 113* 95 111*    Iron/TIBC/Ferritin/ %Sat    Component Value Date/Time   IRON 28 (L) 02/15/2016 0532   TIBC 193 (L) 02/15/2016 0532   FERRITIN 316 08/31/2014 0036   IRONPCTSAT 14 (L) 02/15/2016 0532   Studies/Results: Dg Chest Port 1 View  Result Date: 02/19/2016 CLINICAL DATA:  Pulmonary edema. EXAM: PORTABLE CHEST 1 VIEW COMPARISON:  02/18/2016. FINDINGS: Heart size normal. Persistent but improving bilateral pulmonary infiltrates/edema. Low lung volumes. Persistent pleural effusions, left side greater than right . No pneumothorax. Degenerative changes thoracic spine . IMPRESSION: Persistent but improving bilateral pulmonary infiltrates/edema. Low lung volumes. Persistent pleural effusions, left side greater than right. Electronically Signed   By: Marcello Moores  Register   On: 02/19/2016 07:26   Dg Chest Port 1 View  Result Date: 02/18/2016 CLINICAL DATA:  Acute respiratory distress.  Aspiration. EXAM: PORTABLE CHEST 1 VIEW COMPARISON:  02/18/2016 at 0754 hours FINDINGS: The cardiomediastinal silhouette is unchanged. Pulmonary vascular congestion and diffuse interstitial prominence, slightly increased from prior. Left mid lung and left greater than right basilar airspace opacities have mildly progressed. Small left pleural effusion may have mildly enlarged. No pneumothorax identified. IMPRESSION: Slight interval worsening of left greater than right lung opacities suggestive of edema, although superimposed infection or aspiration is not excluded. Slightly increased size of small left pleural effusion. Electronically Signed   By: Logan Bores M.D.   On: 02/18/2016 11:15   Dg Chest Port 1 View  Result Date: 02/18/2016 CLINICAL DATA:   Acute respiratory failure.  CHF EXAM: PORTABLE CHEST 1 VIEW COMPARISON:  02/17/2016 FINDINGS: Improvement in bilateral airspace disease compatible with pulmonary edema. There remains mild-to-moderate bilateral edema. Small bilateral effusions and bibasilar atelectasis have improved. IMPRESSION: Improving congestive heart failure with edema and bilateral effusions. Bibasilar consolidation has improved. Electronically Signed   By: Franchot Gallo M.D.   On: 02/18/2016 09:23  Dg Chest Port 1 View  Result Date: 02/17/2016 CLINICAL DATA:  Acute congestive heart failure. Acute renal failure. Worsening shortness of breath and hypoxia. EXAM: PORTABLE CHEST 1 VIEW COMPARISON:  02/14/2016 FINDINGS: Worsening diffuse symmetric bilateral airspace disease is seen, most likely due to worsening pulmonary edema. Bilateral pleural effusions are again noted. No pneumothorax visualized. Heart size is stable. IMPRESSION: Worsening diffuse symmetric airspace disease, most likely due ago pulmonary edema. Bilateral pleural effusions again noted. Electronically Signed   By: Earle Gell M.D.   On: 02/17/2016 14:15   Medications: . heparin 10,000 units/ 20 mL infusion syringe Stopped (02/18/16 1628)  . dialysis replacement fluid (prismasate) 400 mL/hr at 02/18/16 1952  . dialysis replacement fluid (prismasate) 200 mL/hr at 02/18/16 2000  . dialysate (PRISMASATE) 2,000 mL/hr at 02/19/16 0746   . aspirin EC  81 mg Oral Daily  . carvedilol  3.125 mg Oral BID WC  . chlorhexidine  15 mL Mouth Rinse BID  . collagenase   Topical Daily  . DAPTOmycin (CUBICIN)  IV  500 mg Intravenous Q24H  . feeding supplement (ENSURE ENLIVE)  237 mL Oral BID BM  . ferumoxytol  510 mg Intravenous Weekly  . insulin aspart  0-5 Units Subcutaneous QHS  . insulin aspart  0-9 Units Subcutaneous TID WC  . insulin detemir  10 Units Subcutaneous Daily  . mouth rinse  15 mL Mouth Rinse q12n4p  . multivitamin with minerals  1 tablet Oral Daily  .  oxybutynin  5 mg Oral BID  . protein supplement shake  11 oz Oral BID BM

## 2016-02-19 NOTE — Consult Note (Signed)
PULMONARY / CRITICAL CARE MEDICINE   Name: Lance [name] MRN: [id number] [name]: Jan 19, 1941    ADMISSION DATE:  02/13/2016 CONSULTATION DATE:  02/19/16  REFERRING MD:  Dr. Clementeen Graham / [facility]   CHIEF COMPLAINT:  Respiratory Failure / Volume Overload   HISTORY OF PRESENT ILLNESS:   75 y/o M with PMH of multiple sclerosis (initially dx in his 44's), spastic quadriplegia, IDDM, HTN, urinary retention, decubitus (ischial) ulcers, prior osteomyelitis, baseline CKD (sr cr 1.8 02/2016) and recent hospitalization from 9/29-104 for right ischial pressure ulcers that required debridement 10/2 (6 days vanco / zosyn) and klebsiella / strep UTI (completed 4 more days bactrim) who presented to [facility] on 10/12 from [place] with reports of shortness of breath and desaturations.    Initial evaluation found him to be hypoxic with saturations in low 80's.  He was placed on O2 with relief in symptoms.  On exam he was found to have decreased breath sounds in RLL.  Initial labs - WBC 14.2, Hgb 9.7, platelets 224, Na 137, K 4.5, Cl 112, sr Cr 3.30, albumin 2.5, troponin 0.15, UA was positive for nitrite, large leukocytes.  EKG was within normal limits.  The patient was admitted per [facility] for possible ongoing UTI, volume overload / possible CHF, acute on chronic renal failure.  The patient was diuresed.  2D ECHO was completed and found the patient to have a new reduction of LVEF to 35-40%, moderated diffuse hypokinesis, grade 1 diastolic dysfunction, and small pericardial effusion.  Cardiology was consulted for acute systolic heart failure / cardiomyopathy.  It was felt the patient was not a candidate for LHC due to AKI.  He developed worsening renal failure and volume overload with subsequent respiratory distress.  He was transferred to ICU for BiPAP.  Lasix was discontinued 10/14. Nephrology was consulted and the patient was started on CRRT.  The patient had 7.7L volume removed since admission with significant improvement in  respiratory status.  He currently has been weaned to room air.  CXR review notes improvement in pulmonary edema, persistent pleural effusions L>R. WBC range 12 > to 9.5 10/18.  Patient is afebrile.  He is being treated with IV daptomycin for ischial wounds / osteomyelitis found on MR sacrum.      Of note, the patient is followed at [facility] by plastic surgery for possible excision of his sacral ulcer with flap reconstruction.    PCCM consulted 10/18 for evaluation of effusions, respiratory failure  PAST MEDICAL HISTORY :  He  has a past medical history of Acute CHF (congestive heart failure) ([place]) (02/14/2016); Acute osteomyelitis, pelvis, right ([place]); Acute renal failure ([place]); Anemia in chronic renal disease; Blood transfusion; Cardiomyopathy ([place]) (02/17/2016); Chronic spastic paraplegia ([facility]); CKD (chronic kidney disease), stage III; Decreased sensation of lower extremity; Decubitus ulcer of ischium; Decubitus ulcer of trochanter, stage 4 ([place]) (06/14/2015); History of MRSA infection; History of recurrent UTIs; Hypertension; MS (multiple sclerosis) ([place]); Neuralgia neuritis, sciatic nerve; Neurogenic bladder; PONV (postoperative nausea and vomiting); Self-catheterizes urinary bladder; and Type 2 diabetes mellitus ([place]).  PAST SURGICAL HISTORY: He  has a past surgical history that includes Tonsillectomy; Finger surgery (2004); Wound debridement; Extracorporeal shock wave lithotripsy (03-23-2011); Incision and drainage of wound (Left, 0/01/9832); and Application of a-cell of extremity (Left, 09/05/2014).  No Known Allergies  No current facility-administered medications on file prior to encounter.    Current Outpatient Prescriptions on File Prior to Encounter  Medication Sig  . Ascorbic Acid (VITAMIN C PO) Take 1 tablet by mouth  daily.   . aspirin 81 MG tablet Take 1 tablet (81 mg total) by mouth daily.  . bisacodyl (DULCOLAX) 10 MG suppository Place 1 suppository (10 mg total) rectally daily as needed  for moderate constipation.  . Cholecalciferol (VITAMIN D3) 2000 UNITS TABS Take 2,000 mg by mouth daily.    . fish oil-omega-3 fatty acids 1000 MG capsule Take 1 capsule (1 g total) by mouth daily.  Marland Kitchen HYDROcodone-acetaminophen (NORCO/VICODIN) 5-325 MG tablet Take 1-2 tablets by mouth every 4 (four) hours as needed for moderate pain.  Marland Kitchen insulin aspart (NOVOLOG FLEXPEN) 100 UNIT/ML FlexPen Inject 8 Units into the skin 3 (three) times daily with meals.  Marland Kitchen LEVEMIR FLEXTOUCH 100 UNIT/ML Pen Inject 18 Units into the skin daily.   . Multiple Vitamin (MULTIVITAMIN WITH MINERALS) TABS tablet Take 1 tablet by mouth daily.  Marland Kitchen oxybutynin (DITROPAN) 5 MG tablet Take 5 mg by mouth 2 (two) times daily.   . simvastatin (ZOCOR) 20 MG tablet Take 20 mg by mouth every evening.  . collagenase (SANTYL) ointment Apply topically daily.  . protein supplement shake (PREMIER PROTEIN) LIQD Take 325 mLs (11 oz total) by mouth 2 (two) times daily between meals. (Patient not taking: Reported on 02/14/2016)    FAMILY HISTORY:  His indicated that his mother is deceased. He indicated that his father is deceased. He indicated that the status of his paternal grandmother is unknown. He indicated that the status of his paternal grandfather is unknown. He indicated that the status of his neg hx is unknown.    SOCIAL HISTORY: He  reports that he quit smoking about 32 years ago. His smoking use included Cigars. He has never used smokeless tobacco. He reports that he drinks about 1.8 oz of alcohol per week . He reports that he does not use drugs.  REVIEW OF SYSTEMS:  POSITIVES IN BOLD Gen: Denies fever, chills, weight change, fatigue, night sweats HEENT: Denies blurred vision, double vision, hearing loss, tinnitus, sinus congestion, rhinorrhea, sore throat, neck stiffness, dysphagia PULM: Denies shortness of breath on admit (resolved), cough, sputum production, hemoptysis, wheezing CV: Denies chest pain, edema, orthopnea, paroxysmal  nocturnal dyspnea, palpitations GI: Denies abdominal pain, nausea, vomiting, diarrhea, hematochezia, melena, constipation, change in bowel habits GU: Denies dysuria, hematuria, polyuria, oliguria, urethral discharge Endocrine: Denies hot or cold intolerance, polyuria, polyphagia or appetite change Derm: Denies rash, dry skin, scaling or peeling skin change Heme: Denies easy bruising, bleeding, bleeding gums Neuro: Denies headache, numbness, weakness, slurred speech, loss of memory or consciousness   SUBJECTIVE:  RN reports pt weaned to room air.  Remains on CVVHD.  No acute issues.   VITAL SIGNS: BP 138/60 (BP Location: Left Arm)   Pulse 88   Temp 97.5 F (36.4 C) (Oral)   Resp (!) 21   Ht 6\' 4"  (1.93 m)   Wt 170 lb 10.2 oz (77.4 kg)   SpO2 100%   BMI 20.77 kg/m   HEMODYNAMICS:    VENTILATOR SETTINGS: FiO2 (%):  [55 %] 55 %  INTAKE / OUTPUT: I/O last 3 completed shifts: In: 940 [P.O.:380; Other:450; IV Piggyback:110] Out: 6812 [Urine:82; XNTZG:0174]  PHYSICAL EXAMINATION: General:  Chronically ill appearing male in NAD, lying in bed  Neuro:  AAOx4, speech clear, Generalized weakness  HEENT:  MM pink/dry, no JVD Cardiovascular:  s1s2 rrr, SR on monitor  Lungs:  Even/non-labored, lungs bilaterally diminished but clear  Abdomen:  Obese/soft, bsx4 active  Musculoskeletal:  No acute deformities Skin:  R sacral/hip dressing c/d/i  LABS:  BMET  Recent Labs Lab 02/18/16 0535 02/18/16 1619 02/19/16 0445  NA 139 137 138  K 4.6 4.7 4.5  CL 105 103 103  CO2 23 25 27   BUN 70* 47* 38*  CREATININE 2.44* 1.99* 1.78*  GLUCOSE 115* 126* 104*    Electrolytes  Recent Labs Lab 02/18/16 0535 02/18/16 1619 02/19/16 0445  CALCIUM 8.3* 8.4* 8.5*  MG 2.7*  --  2.7*  PHOS 4.3 3.6 3.6    CBC  Recent Labs Lab 02/18/16 0535 02/19/16 0358 02/19/16 0445  WBC 12.1* 9.6 9.5  HGB 9.7* 9.3* 9.2*  HCT 29.9* 27.5* 27.6*  PLT 224 243 237    Coag's  Recent Labs Lab  02/18/16 0535 02/19/16 0445  APTT 65* 29    Sepsis Markers  Recent Labs Lab 02/14/16 0220  LATICACIDVEN 0.7    ABG  Recent Labs Lab 02/17/16 1515  PHART 7.234*  PCO2ART 50.1*  PO2ART 156*    Liver Enzymes  Recent Labs Lab 02/14/16 0020  02/18/16 0535 02/18/16 1619 02/19/16 0445  AST 18  --   --   --   --   ALT 15*  --   --   --   --   ALKPHOS 68  --   --   --   --   BILITOT 0.8  --   --   --   --   ALBUMIN 2.5*  < > 2.7* 2.9* 2.7*  < > = values in this interval not displayed.  Cardiac Enzymes  Recent Labs Lab 02/14/16 0020 02/14/16 1117 02/15/16 0532  TROPONINI 0.15* 0.19* 0.23*    Glucose  Recent Labs Lab 02/17/16 2151 02/18/16 0745 02/18/16 1155 02/18/16 1605 02/18/16 2211 02/19/16 0758  GLUCAP 126* 114* 120* 113* 95 111*    Imaging Dg Chest Port 1 View  Result Date: 02/19/2016 CLINICAL DATA:  Pulmonary edema. EXAM: PORTABLE CHEST 1 VIEW COMPARISON:  02/18/2016. FINDINGS: Heart size normal. Persistent but improving bilateral pulmonary infiltrates/edema. Low lung volumes. Persistent pleural effusions, left side greater than right . No pneumothorax. Degenerative changes thoracic spine . IMPRESSION: Persistent but improving bilateral pulmonary infiltrates/edema. Low lung volumes. Persistent pleural effusions, left side greater than right. Electronically Signed   By: Marcello Moores  Register   On: 02/19/2016 07:26   Dg Chest Port 1 View  Result Date: 02/18/2016 CLINICAL DATA:  Acute respiratory distress.  Aspiration. EXAM: PORTABLE CHEST 1 VIEW COMPARISON:  02/18/2016 at 0754 hours FINDINGS: The cardiomediastinal silhouette is unchanged. Pulmonary vascular congestion and diffuse interstitial prominence, slightly increased from prior. Left mid lung and left greater than right basilar airspace opacities have mildly progressed. Small left pleural effusion may have mildly enlarged. No pneumothorax identified. IMPRESSION: Slight interval worsening of left  greater than right lung opacities suggestive of edema, although superimposed infection or aspiration is not excluded. Slightly increased size of small left pleural effusion. Electronically Signed   By: Logan Bores M.D.   On: 02/18/2016 11:15     STUDIES:  MR Sacrum 10/13 >> ischial tuberosity osteomyelitis, large ulceration, bladder wall thickened concerning for cystitis  Renal US 10/13 >> simple R renal cyst, increased echogenicity of renal parenchyma c/w medical renal disease  CULTURES: BCx2 10/14 >>   ANTIBIOTICS: Vanco 10/13 x1 Ceftriaxone 10/13 >>  Daptomycin 10/14 >>   SIGNIFICANT EVENTS: 9/29-10/4  Admit for R ischial pressure ulcers that required debridement 10/2 and klebsiella / strep UTI  10/12  Admit with SOB, volume overload/pulmoanry edema, new systolic CHF /  NICM 10/16  Tx to ICU with resp distress, HD catheter placed, CRRT initiated   LINES/TUBES: R Fem TLC 10/16 >>   DISCUSSION: 75 y/o M with PMH of MS with chronic ischial decubitus ulcers s/p recent debridement admitted 10/12 with SOB in the setting of volume overload secondary to new NICM/systolic CHF (new reduction in EF to 35-40%).  Developed respiratory distress and transferred to ICU for BiPAP.    ASSESSMENT / PLAN:  PULMONARY A: Acute Respiratory Failure with Hypoxia - in setting of volume overload, cardiomyopathy, clinically improved Bilateral Pleural Effusions - L>R, suspect transudative effusions in the setting of CHF P:   Pulmonary hygiene - IS, mobilize as able / chair position  Volume removal per CRRT / Nephrology  No role for thoracentesis given suspect transudative etiology Intermittent CXR  Wean O2 for sats > 92% Intermittent BiPAP for increased WOB Initiated conversation regarding mechanical ventilation.  He would be difficult to liberate from the vent under any circumstance given history of MS / overall weakness.  He has a living will that states he does not want "to be on machines  indefinitely"  CARDIOVASCULAR A:  Acute Systolic CHF / Cardiomyopathy - suspected NICM as no wall motion abnormalities Cardiomyopathy - new, EF reduction to 35-40% Elevated Troponin - mild, suspect demand ischemia  HTN P:  Monitor in SDU  Continue ASA + Statin  Cardiology following > recommends nuclear stress testing later in admit or as outpatient   RENAL A:   AKI on CKD - baseline sr cr 1.8 02/2016 Hyperkalemia  P:   CVVHD per Nephrology  Will need to transfer to [facility] for intermittent HD > defer to renal  Monitor BMP  Replace electrolytes as indicated   GASTROINTESTINAL A:   Protein Calorie Malnutrition  P:   Nutrition consult for dietary supplement needs Diet as tolerated   HEMATOLOGIC A:   Anemia of Chronic Disease + IDA  P:  Trend CBC    INFECTIOUS A:   Right Ischial Osteomyelitis  P:   ID following Continue abx as ordered, see above Does not appear to be acutely infected from a pulmonary perspective Low pressure mattress  Wound Care   ENDOCRINE A:   IDDM    P:   SSI   NEUROLOGIC A:   Multiple Sclerosis  Spastic Paraplegia  P:   Supportive therapies - low air loss mattress PT as able  Frequent turning with wounds   FAMILY  - Updates:  Patient updated on plan of care at bedside.    - Inter-disciplinary family meet or Palliative Care meeting due by:  Ongoing, will follow up with patient / family in am regarding code status / overall goals of care.     Noe Gens, NP-C [facility] Pgr: [phone] or if no answer [phone] 02/19/2016, 10:10 AM  Attending Note:  I have examined patient, reviewed labs, studies and notes. I have discussed the case with B Ollis, and I agree with the data and plans as amended above. 75 yo man, hx MS and associated complications including spastic paraplegia, decubitus ulcers and osteo. Also with hx DM, CKD, recent rx for UTI. He was admitted with acute resp failure w B interstitial / alveolar  infiltrates, found to have new acute renal failure, new global LV dysfxn. He required BiPAP support on presentation. He was continued on dapto and ceftriaxone to complete rx for his UTI and to treat R hip osteo. Started on CVVHD and had 7L + volume  removal with improved CXR and resp status. On evaluation today he remains on CVVHD, is now on RA and is able to converse comfortably with a weak voice. He has LLL atx on CXR and had decreased bibasilar BS. R hip has a large clean dressing in place. Continuing current therapy, expect that CVVHD will end 10/18 or 19, then we can assess renal recovery. He will likely ned more formal cards w/u at some point once stabilized. Could start w repeat TTE to see if global ischemia was due to sepsis (may resolve). We broached the subject of code status today. Discussed the dangers of MV, particularly the risk of requiring prolonged MV should he decline to point of requiring requiring acute MV support. I do not believe he would be easily extubated given hx MS. Asked them to consider his wishes. I will speak with them again, recommend that we be aggressive with his care but consider DNI status given the above.  Independent critical care time is 40  minutes.   Baltazar Apo, MD, PhD 02/19/2016, 12:10 PM [facility] [phone] or if no answer [phone]

## 2016-02-19 NOTE — Progress Notes (Signed)
CPT for 1600 completed- doc. Under 1200 times two.

## 2016-02-19 NOTE — Progress Notes (Signed)
1945. PrismaFlex TMP and pressure drop levels suggesting filter may clot off soon. No clots visible in set, blood returned. Filter changed. Therapy resumed at 2035. Will continue to monitor.

## 2016-02-19 NOTE — Progress Notes (Signed)
    [facility] Infectious [facility]   Reason for visit: Follow up on osteomyelitis  Interval History: feels much better since this am with his breathing; no fever, on CRRT  Physical Exam: Constitutional:  Vitals:   02/19/16 1400 02/19/16 1500  BP: 129/62 138/60  Pulse: 90 90  Resp: (!) 22 18  Temp:     patient appears in NAD Respiratory: Normal respiratory effort; CTA B Cardiovascular: RRR GI: soft, nt  Review of Systems: Constitutional: negative for fevers and chills Gastrointestinal: negative for diarrhea  Lab Results  Component Value Date   WBC 9.5 02/19/2016   HGB 9.2 (L) 02/19/2016   HCT 27.6 (L) 02/19/2016   MCV 90.8 02/19/2016   PLT 237 02/19/2016    Lab Results  Component Value Date   CREATININE 1.78 (H) 02/19/2016   BUN 38 (H) 02/19/2016   NA 138 02/19/2016   K 4.5 02/19/2016   CL 103 02/19/2016   CO2 27 02/19/2016    Lab Results  Component Value Date   ALT 15 (L) 02/14/2016   AST 18 02/14/2016   ALKPHOS 68 02/14/2016     Microbiology: Recent Results (from the past 240 hour(s))  Culture, blood (Routine X 2) w Reflex to ID Panel     Status: None (Preliminary result)   Collection Time: 02/15/16  1:00 PM  Result Value Ref Range Status   Specimen Description BLOOD RIGHT ARM  2 ML IN YELLOW  Final   Special Requests NONE  Final   Culture   Final    NO GROWTH 4 DAYS Performed at [facility]    Report Status PENDING  Incomplete  Culture, blood (Routine X 2) w Reflex to ID Panel     Status: None (Preliminary result)   Collection Time: 02/15/16  1:00 PM  Result Value Ref Range Status   Specimen Description BLOOD LEFT ARM  3 ML IN YELLOW  Final   Special Requests NONE  Final   Culture   Final    NO GROWTH 4 DAYS Performed at [facility]    Report Status PENDING  Incomplete    Impression/Plan:  1. Osteomyelitis - will need prolonged treatment 6 weeks to help with wound healing.   2. Renal insufficiency - on crrt.   Unclear  if related to previous vancomycin and/or bactrim or other cause, but on daptomycin now to avoid any nephrotoxicity.   3. Medication management - getting qod daptomycin on crrt.  Weekly ck.

## 2016-02-19 NOTE — Progress Notes (Signed)
PROGRESS NOTE                                                                                                                                                                                                             Patient Demographics:    [name], is a 75 y.o. male, [name] - 27-Oct-1940, IHW:[id number]  Admit date - 02/13/2016   Admitting Physician Edwin Dada, MD  Outpatient Primary MD for the patient is Lance Cobble, MD  LOS - 5  Outpatient Specialists:   Chief Complaint  Patient presents with  . Respiratory Distress       Brief Narrative   75 year old male with history of multiple sclerosis, spastic quadriplegia, chronic Foley for urinary retention, chronic kidney disease stage III, diabetes mellitus, hypertension, history of decubitus ulcers with history of ischial osteomyelitis, who was recently hospitalized due to right ischial pressure ulcer requiring debridement and treated with vancomycin and Zosyn. He was then discharged on oral Bactrim (both for the ulcer and Klebsiella/strep UTI). He was discharged to SNF but returned with new onset dyspnea and hypoxemia. Chest x-ray showed pulmonary edema with worsened renal function. He had elevated BNP >900 and mildly elevated troponins. Patient found to have acute cardiomyopathy with acute respiratory failure requiring BiPAP and transferred to ICU. He also developed worsened renal function concerning for cardiorenal syndrome now requiring CRRT.    Subjective:   Patient on CRRT, issues of clotted filter during the night now working. Patient having episodic cough unable to bring up any phlegm.   Assessment  & Plan :    Principal Problem:   Acute hypoxic respiratory failure ([place]) Secondary to acute cardiomyopathy with pulmonary edema. On improved with aggressive diuresis, now requiring CRRT. Wean oxygen to room air. Chest x-ray shows persistent  pulmonary edema which appears improving. SLP evaluation to rule out aspiration. Has bilateral pleural effusion (L >R), likely transudate from volume overload. Ordered pulmonary toilet. Pulmonary consult appreciated. Recommend volume management with CRRT.   Active Problems: Acute systolic CHF/nonischemic cardiomyopathy EF of 35-40% as per echo. Elevated troponin likely due to demand ischemia. No wall motion abnormalities. On tinea Coreg. Lasix discontinued due to worsening renal function. Now requiring CRRT.   Acute on chronic kidney disease Appears baseline creatinine of 1.8. Suspect cardiorenal etiology.ASO titre positive, ? Immune complex glomerulonephritis. Patient started on CRRT,  renal plan on temporary HD catheter placement with transferred to [facility] for HD if hemodynamically stable.  Decubitus stage IV ulcer with Right ischial osteomyelitis As suggested on MRI. Patient being followed by plastic surgeon at [name] for possible muscle flap surgery. Prior culture from ischial wounds grew MRSA and enterococcus. Started on 6 weeks course of IV daptomycin by ID.  Severe protein calorie malnutrition Added supplement.    DM (diabetes mellitus) type II controlled with renal manifestation ([facility]) Currently on reduced dose Levemir 10 units at bedtime with sliding scale coverage. Last A1c of 6.1.    Multiple sclerosis ([place]) With spastic paraplegia diagnosed in mid 13s.  Normocytic anemia At baseline.  Chronic Foley catheter   history of recurrent UTI. On suppressive trimethoprim at home. Continue oxybutynin.       Code Status : Full code  Family Communication  : Discussed at length with son and daughter and updated planning  Disposition Plan  : Continue ICU monitoring  Barriers For Discharge : Active symptoms  Consults  :   Renal Cardiology Pulmonary ID  Procedures  :  2-D echo CRRT MRI sacrum  DVT Prophylaxis  :  SCDs  Lab Results  Component Value Date    PLT 237 02/19/2016    Antibiotics  :   Anti-infectives    Start     Dose/Rate Route Frequency Ordered Stop   02/19/16 0600  DAPTOmycin (CUBICIN) 500 mg in sodium chloride 0.9 % IVPB     500 mg 220 mL/hr over 30 Minutes Intravenous Every 24 hours 02/18/16 1413     02/15/16 1500  DAPTOmycin (CUBICIN) 676 mg in sodium chloride 0.9 % IVPB  Status:  Discontinued     8 mg/kg  84.5 kg 227 mL/hr over 30 Minutes Intravenous Every 48 hours 02/15/16 1407 02/18/16 1413   02/15/16 1400  DAPTOmycin (CUBICIN) 676 mg in sodium chloride 0.9 % IVPB  Status:  Discontinued     8 mg/kg  84.5 kg 227 mL/hr over 30 Minutes Intravenous Every 24 hours 02/15/16 1353 02/15/16 1406   02/14/16 0215  vancomycin (VANCOCIN) IVPB 1000 mg/200 mL premix  Status:  Discontinued     1,000 mg 200 mL/hr over 60 Minutes Intravenous  Once 02/14/16 0212 02/14/16 0243   02/14/16 0145  cefTRIAXone (ROCEPHIN) 1 g in dextrose 5 % 50 mL IVPB     1 g 100 mL/hr over 30 Minutes Intravenous  Once 02/14/16 0144 02/14/16 0232        Objective:   Vitals:   02/19/16 1000 02/19/16 1016 02/19/16 1100 02/19/16 1200  BP: (!) 131/53  (!) 126/54 (!) 122/49  Pulse: 92 88 83 74  Resp: 15 18 (!) 24 19  Temp:    97.5 F (36.4 C)  TempSrc:    Oral  SpO2: 99% 100% 100% 98%  Weight:      Height:        Wt Readings from Last 3 Encounters:  02/19/16 77.4 kg (170 lb 10.2 oz)  02/13/16 85 kg (187 lb 6.4 oz)  02/06/16 79.9 kg (176 lb 3.2 oz)     Intake/Output Summary (Last 24 hours) at 02/19/16 1302 Last data filed at 02/19/16 1300  Gross per 24 hour  Intake             1000 ml  Output             4112 ml  Net            -3112 ml  Physical Exam  Gen: Elderly male appears fatigued HEENT: Pallor present, moist mucosa, supple neck Chest: Diminished bilateral bed sounds with fine crackles CVS: N S1&S2, no murmurs, rubs or gallop GI: soft, NT, ND, BS+, chronic Foley Musculoskeletal: warm, sacral decubitus ulcer CNS: Alert and  oriented, paraplegic    Data Review:    CBC  Recent Labs Lab 02/14/16 0020  02/15/16 0532 02/17/16 0352 02/18/16 0535 02/19/16 0358 02/19/16 0445  WBC 14.2*  < > 11.1* 12.6* 12.1* 9.6 9.5  HGB 9.7*  < > 9.4* 9.8* 9.7* 9.3* 9.2*  HCT 28.2*  < > 27.3* 29.4* 29.9* 27.5* 27.6*  PLT 224  < > 194 202 224 243 237  MCV 87.0  < > 87.8 89.9 93.7 90.2 90.8  MCH 29.9  < > 30.2 30.0 30.4 30.5 30.3  MCHC 34.4  < > 34.4 33.3 32.4 33.8 33.3  RDW 16.3*  < > 16.9* 17.3* 17.8* 17.5* 17.6*  LYMPHSABS 1.9  --  2.1  --  2.4 2.5 2.5  MONOABS 1.1*  --  0.9  --  1.2* 1.1* 1.2*  EOSABS 0.1  --  0.0  --  0.0 0.0 0.0  BASOSABS 0.1  --  0.0  --  0.0 0.0 0.0  < > = values in this interval not displayed.  Chemistries   Recent Labs Lab 02/14/16 0020  02/17/16 0352 02/17/16 1740 02/18/16 0535 02/18/16 1619 02/19/16 0445  NA 137  < > 140 136 139 137 138  K 4.5  < > 5.6* 5.1 4.6 4.7 4.5  CL 112*  < > 107 105 105 103 103  CO2 17*  < > 21* 20* 23 25 27   GLUCOSE 143*  < > 169* 204* 115* 126* 104*  BUN 90*  < > 116* 119* 70* 47* 38*  CREATININE 3.30*  < > 4.15* 4.06* 2.44* 1.99* 1.78*  CALCIUM 8.4*  < > 8.9 8.4* 8.3* 8.4* 8.5*  MG  --   --   --   --  2.7*  --  2.7*  AST 18  --   --   --   --   --   --   ALT 15*  --   --   --   --   --   --   ALKPHOS 68  --   --   --   --   --   --   BILITOT 0.8  --   --   --   --   --   --   < > = values in this interval not displayed. ------------------------------------------------------------------------------------------------------------------ No results for input(s): CHOL, HDL, LDLCALC, TRIG, CHOLHDL, LDLDIRECT in the last 72 hours.  Lab Results  Component Value Date   HGBA1C 6.0 (H) 02/14/2016   ------------------------------------------------------------------------------------------------------------------ No results for input(s): TSH, T4TOTAL, T3FREE, THYROIDAB in the last 72 hours.  Invalid input(s):  FREET3 ------------------------------------------------------------------------------------------------------------------ No results for input(s): VITAMINB12, FOLATE, FERRITIN, TIBC, IRON, RETICCTPCT in the last 72 hours.  Coagulation profile No results for input(s): INR, PROTIME in the last 168 hours.  No results for input(s): DDIMER in the last 72 hours.  Cardiac Enzymes  Recent Labs Lab 02/14/16 0020 02/14/16 1117 02/15/16 0532  TROPONINI 0.15* 0.19* 0.23*   ------------------------------------------------------------------------------------------------------------------    Component Value Date/Time   BNP 927.0 (H) 02/14/2016 0020    Inpatient Medications  Scheduled Meds: . aspirin EC  81 mg Oral Daily  . carvedilol  3.125 mg Oral BID WC  . chlorhexidine  15 mL Mouth Rinse BID  . collagenase   Topical Daily  . DAPTOmycin (CUBICIN)  IV  500 mg Intravenous Q24H  . feeding supplement (ENSURE ENLIVE)  237 mL Oral BID BM  . ferumoxytol  510 mg Intravenous Weekly  . insulin aspart  0-5 Units Subcutaneous QHS  . insulin aspart  0-9 Units Subcutaneous TID WC  . insulin detemir  10 Units Subcutaneous Daily  . mouth rinse  15 mL Mouth Rinse q12n4p  . multivitamin with minerals  1 tablet Oral Daily  . oxybutynin  5 mg Oral BID  . protein supplement shake  11 oz Oral BID BM   Continuous Infusions: . heparin 10,000 units/ 20 mL infusion syringe 800 Units/hr (02/19/16 1300)  . dialysis replacement fluid (prismasate) 400 mL/hr at 02/19/16 0954  . dialysis replacement fluid (prismasate) 200 mL/hr at 02/18/16 2000  . dialysate (PRISMASATE) 2,000 mL/hr at 02/19/16 1259   PRN Meds:.acetaminophen **OR** acetaminophen, ALPRAZolam, alteplase, bisacodyl, heparin, heparin, HYDROcodone-acetaminophen, ondansetron **OR** ondansetron (ZOFRAN) IV, sodium chloride  Micro Results Recent Results (from the past 240 hour(s))  Culture, blood (Routine X 2) w Reflex to ID Panel     Status: None  (Preliminary result)   Collection Time: 02/15/16  1:00 PM  Result Value Ref Range Status   Specimen Description BLOOD RIGHT ARM  2 ML IN YELLOW  Final   Special Requests NONE  Final   Culture   Final    NO GROWTH 3 DAYS Performed at [facility]    Report Status PENDING  Incomplete  Culture, blood (Routine X 2) w Reflex to ID Panel     Status: None (Preliminary result)   Collection Time: 02/15/16  1:00 PM  Result Value Ref Range Status   Specimen Description BLOOD LEFT ARM  3 ML IN YELLOW  Final   Special Requests NONE  Final   Culture   Final    NO GROWTH 3 DAYS Performed at [facility]    Report Status PENDING  Incomplete    Radiology Reports Dg Chest 2 View  Result Date: 02/14/2016 CLINICAL DATA:  Acute onset of wheezing and shortness of breath. Initial encounter. EXAM: CHEST  2 VIEW COMPARISON:  Chest radiograph performed 08/30/2014 FINDINGS: Vascular congestion is noted, with bilateral central airspace opacification and small bilateral pleural effusions, compatible with pulmonary edema. No pneumothorax is seen. The cardiomediastinal silhouette is borderline normal in size. No acute osseous abnormalities are identified. IMPRESSION: Vascular congestion, with bilateral central airspace opacification and small bilateral pleural effusions, compatible with pulmonary edema. Electronically Signed   By: Garald Balding M.D.   On: 02/14/2016 01:35   US Renal  Result Date: 02/14/2016 CLINICAL DATA:  Acute renal failure. EXAM: RENAL / URINARY TRACT ULTRASOUND COMPLETE COMPARISON:  Renal ultrasound of March 30, 2011. FINDINGS: Right Kidney: Length: 11.2 cm. Two simple cysts are seen in the upper pole, with the largest measuring 2.3 cm. Increased echogenicity of renal parenchyma is noted consistent with medical renal disease. No mass or hydronephrosis visualized. Left Kidney: Length: 10.7 cm. Increased echogenicity of renal parenchyma is noted consistent with medical renal  disease. No mass or hydronephrosis visualized. Bladder: Decompressed secondary to Foley catheter. IMPRESSION: Simple right renal cyst. Increased echogenicity of renal parenchyma bilaterally is noted consistent with medical renal disease. Electronically Signed   By: Marijo Conception, M.D.   On: 02/14/2016 08:36   Mr Sacrum/si Joints Wo Contrast  Result Date: 02/14/2016 CLINICAL DATA:  Right ischial pressure ulcer since  September of this year. Wound culture from 01/30/2016 grew MRSA. EXAM: MR SACRUM WITHOUT CONTRAST TECHNIQUE: Multiplanar, multisequence MR imaging was performed. No intravenous contrast was administered. COMPARISON:  MRI of the pelvis 09/01/2014. FINDINGS: There is partial visualization of marrow edema in the left parasymphyseal pubic bone, unchanged. No fracture is identified. A large ulceration over the right ischial tuberosity extends to bone. The wound appears to be packed. Mildly increased T2 signal and thickening are seen about the margins of the wound which could be due to infection or granulation tissue. A small focus of marrow edema is seen subjacent to the ulcer in the ischium compatible with osteomyelitis. No soft tissue abscess is identified. An ulceration is also seen over the left ischial tuberosity which may be healed. Marked edema is seen in subcutaneous soft tissues over the right greater trochanter. Small focus of mild marrow edema is seen in the underlying greater trochanter. Bone marrow signal is normal in the sacrum. There is no SI joint effusion. Small amount of fluid is present in the hip joints bilaterally most compatible with physiologic change. No muscle or tendon tear is seen. Imaged intrapelvic contents demonstrated a Foley catheter in place. The urinary bladder is decompressed. Its walls appear markedly thickened. IMPRESSION: Large ulceration over the right ischial tuberosity extends to bone. Mild marrow edema is seen in the subjacent ischial tuberosity compatible with  osteomyelitis. Subcutaneous edema over the right greater trochanter may be due to cellulitis. Small focus of mild marrow edema in the greater trochanter is worrisome for osteomyelitis. The urinary bladder is nearly completely decompressed but its walls appear markedly thickened which may be due to chronic bladder outlet obstruction and/or cystitis. Electronically Signed   By: Inge Rise M.D.   On: 02/14/2016 16:03   Dg Chest Port 1 View  Result Date: 02/19/2016 CLINICAL DATA:  Pulmonary edema. EXAM: PORTABLE CHEST 1 VIEW COMPARISON:  02/18/2016. FINDINGS: Heart size normal. Persistent but improving bilateral pulmonary infiltrates/edema. Low lung volumes. Persistent pleural effusions, left side greater than right . No pneumothorax. Degenerative changes thoracic spine . IMPRESSION: Persistent but improving bilateral pulmonary infiltrates/edema. Low lung volumes. Persistent pleural effusions, left side greater than right. Electronically Signed   By: Marcello Moores  Register   On: 02/19/2016 07:26   Dg Chest Port 1 View  Result Date: 02/18/2016 CLINICAL DATA:  Acute respiratory distress.  Aspiration. EXAM: PORTABLE CHEST 1 VIEW COMPARISON:  02/18/2016 at 0754 hours FINDINGS: The cardiomediastinal silhouette is unchanged. Pulmonary vascular congestion and diffuse interstitial prominence, slightly increased from prior. Left mid lung and left greater than right basilar airspace opacities have mildly progressed. Small left pleural effusion may have mildly enlarged. No pneumothorax identified. IMPRESSION: Slight interval worsening of left greater than right lung opacities suggestive of edema, although superimposed infection or aspiration is not excluded. Slightly increased size of small left pleural effusion. Electronically Signed   By: Logan Bores M.D.   On: 02/18/2016 11:15   Dg Chest Port 1 View  Result Date: 02/18/2016 CLINICAL DATA:  Acute respiratory failure.  CHF EXAM: PORTABLE CHEST 1 VIEW COMPARISON:   02/17/2016 FINDINGS: Improvement in bilateral airspace disease compatible with pulmonary edema. There remains mild-to-moderate bilateral edema. Small bilateral effusions and bibasilar atelectasis have improved. IMPRESSION: Improving congestive heart failure with edema and bilateral effusions. Bibasilar consolidation has improved. Electronically Signed   By: Franchot Gallo M.D.   On: 02/18/2016 09:23   Dg Chest Port 1 View  Result Date: 02/17/2016 CLINICAL DATA:  Acute congestive heart failure. Acute  renal failure. Worsening shortness of breath and hypoxia. EXAM: PORTABLE CHEST 1 VIEW COMPARISON:  02/14/2016 FINDINGS: Worsening diffuse symmetric bilateral airspace disease is seen, most likely due to worsening pulmonary edema. Bilateral pleural effusions are again noted. No pneumothorax visualized. Heart size is stable. IMPRESSION: Worsening diffuse symmetric airspace disease, most likely due ago pulmonary edema. Bilateral pleural effusions again noted. Electronically Signed   By: Earle Gell M.D.   On: 02/17/2016 14:15   Dg Hip Unilat With Pelvis 2-3 Views Right  Result Date: 01/30/2016 CLINICAL DATA:  75 year old with approximate 6 week history of a decubitus ulcer involving the right buttock, with recent worsening of the infection. EXAM: DG HIP (WITH OR WITHOUT PELVIS) 2-3V RIGHT COMPARISON:  Right hip x-rays 05/28/2015.  MRI pelvis 09/01/2014. FINDINGS: No evidence of acute fracture or dislocation. No evidence of osteomyelitis. No visible joint effusion. Moderate joint space narrowing. Bone mineral density well-preserved. Included AP pelvis again shows the healed osteomyelitis with new bone formation at the left ischium. Contralateral left hip joint with symmetric moderate joint space narrowing. Sacroiliac joints and symphysis pubis intact. Mild degenerative changes involving the visualized lower lumbar spine. Penile prosthesis again noted. IMPRESSION: No acute osseous abnormality. No radiographic  evidence of acute osteomyelitis. Electronically Signed   By: Evangeline Dakin M.D.   On: 01/30/2016 20:36    Time Spent in minutes  35   Louellen Molder M.D on 02/19/2016 at 1:02 PM  Between 7am to 7pm - Pager - [phone]  After 7pm go to [email or web address] - password [name]  [facility] -  Office  [phone]

## 2016-02-19 NOTE — Progress Notes (Addendum)
2130. RN paged Dr. Jonnie Finner [phone]) at number listed in today's note to notify MD of filter having been replaced, per care order instruction. Second paged attempted at 2230. RN called  [employer] and had page sent to on-call MD through their secretarial service at [phone]. RN informed MD of reason anticoagulant was stopped on 02/18/16. RN also informed MD that filter was changed at 2000 due to filter TMP and pressure drop pressures increasing simultaneously, filter had not clotted, and blood was able to be returned to patient. Orders received. Renal MD ordered RN to increase heparin to 900 U/hr, per ACT level check at 2154, and to follow protocol for titration of heparin dosing and ACT checks. Will continue to monitor.

## 2016-02-19 NOTE — Progress Notes (Signed)
Per MD order- PT placed on CPT Q4. RT placed PT on bed percussion for 20 minutes and PT demonstrated verbal and hands on understanding of Flutter device to use Q2 hrs. PT tolerating bed percussion well at this time- HE 93, RR 20, Sp02 100% on 2 lpm, BS diminished-clr. RN aware.

## 2016-02-19 NOTE — Progress Notes (Signed)
CSW assisting with d/c planning. Pt to transfer to [facility] tomorrow for HD, per nsg. [place] contacted and updated. CSW will continue to follow to assist with d/c planning to SNF.  Roselyn Reef Jamecia Lerman LCSW [phone]

## 2016-02-19 NOTE — Progress Notes (Signed)
Patient Name: Lance [name] Date of Encounter: 02/19/2016  Primary Cardiologist: Dr. Cliffton Asters Problem List     Principal Problem:   Acute CHF (congestive heart failure) ([place]) Active Problems:   Multiple sclerosis (HCC)   Sacral decubitus ulcer   Normocytic anemia   Acute renal failure (ARF) (HCC)   Spastic quadriplegia (HCC)   Chronic kidney disease, stage 3   Essential hypertension, benign   Acute osteomyelitis, pelvis, right (HCC)   Decubitus ulcer of right ischial area   DM (diabetes mellitus) type II controlled with renal manifestation (HCC)   Recurrent UTI (urinary tract infection)   AKI (acute kidney injury) (HCC)   Depression   Elevated troponin   Severe protein-calorie malnutrition (HCC)   Acute pulmonary edema (HCC)   Hypoxia   Decubitus ulcer of trochanter, stage 4 (HCC)   Generalized anxiety disorder   Cardiomyopathy ([place])     Subjective   Breathing significantly improved, close to baseline. No chest discomfort or palpitations. Daughter at the bedside.   Inpatient Medications    Scheduled Meds: . aspirin EC  81 mg Oral Daily  . carvedilol  3.125 mg Oral BID WC  . chlorhexidine  15 mL Mouth Rinse BID  . collagenase   Topical Daily  . DAPTOmycin (CUBICIN)  IV  500 mg Intravenous Q24H  . feeding supplement (ENSURE ENLIVE)  237 mL Oral BID BM  . ferumoxytol  510 mg Intravenous Weekly  . insulin aspart  0-5 Units Subcutaneous QHS  . insulin aspart  0-9 Units Subcutaneous TID WC  . insulin detemir  10 Units Subcutaneous Daily  . mouth rinse  15 mL Mouth Rinse q12n4p  . multivitamin with minerals  1 tablet Oral Daily  . oxybutynin  5 mg Oral BID  . protein supplement shake  11 oz Oral BID BM   Continuous Infusions: . heparin 10,000 units/ 20 mL infusion syringe 800 Units/hr (02/19/16 1000)  . dialysis replacement fluid (prismasate) 400 mL/hr at 02/19/16 0954  . dialysis replacement fluid (prismasate) 200 mL/hr at 02/18/16 2000  .  dialysate (PRISMASATE) 2,000 mL/hr at 02/19/16 0746   PRN Meds: acetaminophen **OR** acetaminophen, ALPRAZolam, alteplase, bisacodyl, heparin, heparin, HYDROcodone-acetaminophen, ondansetron **OR** ondansetron (ZOFRAN) IV, sodium chloride   Vital Signs    Vitals:   02/19/16 0800 02/19/16 0900 02/19/16 1000 02/19/16 1016  BP: (!) 131/48 138/60 (!) 131/53   Pulse: 75 88 92 88  Resp: 20 (!) 21 15 18   Temp: 97.5 F (36.4 C)     TempSrc: Oral     SpO2: 100% 100% 99% 100%  Weight:      Height:        Intake/Output Summary (Last 24 hours) at 02/19/16 1056 Last data filed at 02/19/16 1000  Gross per 24 hour  Intake              980 ml  Output             4183 ml  Net            -3203 ml   Filed Weights   02/17/16 1645 02/18/16 1400 02/19/16 0230  Weight: 185 lb 3 oz (84 kg) 182 lb 15.7 oz (83 kg) 170 lb 10.2 oz (77.4 kg)    Physical Exam   GEN: Chronically ill-appearing Caucasian male, currently in no acute distress.  HEENT: Grossly normal.  Neck: Supple, no JVD, carotid bruits, or masses. Cardiac: RRR, no murmurs, rubs, or gallops. No clubbing, cyanosis, or edema.  Radials/DP/PT 2+ and equal bilaterally.  Respiratory:  Respirations regular and unlabored, clear to auscultation bilaterally. GI: Soft, nontender, nondistended, BS + x 4. MS: no deformity. Chronic atrophy noted secondary to quadriplegia. Skin: warm and dry, no rash. Neuro:  Strength and sensation are intact. Psych: AAOx3.  Normal affect.  Labs    CBC  Recent Labs  02/19/16 0358 02/19/16 0445  WBC 9.6 9.5  NEUTROABS 6.0 5.8  HGB 9.3* 9.2*  HCT 27.5* 27.6*  MCV 90.2 90.8  PLT 243 720   Basic Metabolic Panel  Recent Labs  02/18/16 0535 02/18/16 1619 02/19/16 0445  NA 139 137 138  K 4.6 4.7 4.5  CL 105 103 103  CO2 23 25 27   GLUCOSE 115* 126* 104*  BUN 70* 47* 38*  CREATININE 2.44* 1.99* 1.78*  CALCIUM 8.3* 8.4* 8.5*  MG 2.7*  --  2.7*  PHOS 4.3 3.6 3.6   Liver Function Tests  Recent  Labs  02/18/16 1619 02/19/16 0445  ALBUMIN 2.9* 2.7*   No results for input(s): LIPASE, AMYLASE in the last 72 hours. Cardiac Enzymes No results for input(s): CKTOTAL, CKMB, CKMBINDEX, TROPONINI in the last 72 hours. BNP Invalid input(s): POCBNP D-Dimer No results for input(s): DDIMER in the last 72 hours. Hemoglobin A1C No results for input(s): HGBA1C in the last 72 hours. Fasting Lipid Panel No results for input(s): CHOL, HDL, LDLCALC, TRIG, CHOLHDL, LDLDIRECT in the last 72 hours. Thyroid Function Tests No results for input(s): TSH, T4TOTAL, T3FREE, THYROIDAB in the last 72 hours.  Invalid input(s): FREET3  Telemetry    NSR, HR in 70's - 90's. No atopic events. - Personally Reviewed  ECG   No new tracings.   Radiology    Dg Chest Port 1 View  Result Date: 02/19/2016 CLINICAL DATA:  Pulmonary edema. EXAM: PORTABLE CHEST 1 VIEW COMPARISON:  02/18/2016. FINDINGS: Heart size normal. Persistent but improving bilateral pulmonary infiltrates/edema. Low lung volumes. Persistent pleural effusions, left side greater than right . No pneumothorax. Degenerative changes thoracic spine . IMPRESSION: Persistent but improving bilateral pulmonary infiltrates/edema. Low lung volumes. Persistent pleural effusions, left side greater than right. Electronically Signed   By: Marcello Moores  Register   On: 02/19/2016 07:26   Dg Chest Port 1 View  Result Date: 02/18/2016 CLINICAL DATA:  Acute respiratory distress.  Aspiration. EXAM: PORTABLE CHEST 1 VIEW COMPARISON:  02/18/2016 at 0754 hours FINDINGS: The cardiomediastinal silhouette is unchanged. Pulmonary vascular congestion and diffuse interstitial prominence, slightly increased from prior. Left mid lung and left greater than right basilar airspace opacities have mildly progressed. Small left pleural effusion may have mildly enlarged. No pneumothorax identified. IMPRESSION: Slight interval worsening of left greater than right lung opacities suggestive of  edema, although superimposed infection or aspiration is not excluded. Slightly increased size of small left pleural effusion. Electronically Signed   By: Logan Bores M.D.   On: 02/18/2016 11:15   Dg Chest Port 1 View  Result Date: 02/18/2016 CLINICAL DATA:  Acute respiratory failure.  CHF EXAM: PORTABLE CHEST 1 VIEW COMPARISON:  02/17/2016 FINDINGS: Improvement in bilateral airspace disease compatible with pulmonary edema. There remains mild-to-moderate bilateral edema. Small bilateral effusions and bibasilar atelectasis have improved. IMPRESSION: Improving congestive heart failure with edema and bilateral effusions. Bibasilar consolidation has improved. Electronically Signed   By: Franchot Gallo M.D.   On: 02/18/2016 09:23   Dg Chest Port 1 View  Result Date: 02/17/2016 CLINICAL DATA:  Acute congestive heart failure. Acute renal failure. Worsening shortness of breath and  hypoxia. EXAM: PORTABLE CHEST 1 VIEW COMPARISON:  02/14/2016 FINDINGS: Worsening diffuse symmetric bilateral airspace disease is seen, most likely due to worsening pulmonary edema. Bilateral pleural effusions are again noted. No pneumothorax visualized. Heart size is stable. IMPRESSION: Worsening diffuse symmetric airspace disease, most likely due ago pulmonary edema. Bilateral pleural effusions again noted. Electronically Signed   By: Earle Gell M.D.   On: 02/17/2016 14:15    Cardiac Studies   Echocardiogram: 02/14/2016 Study Conclusions  - Left ventricle: The cavity size was normal. Wall thickness was   normal. Systolic function was moderately reduced. The estimated   ejection fraction was in the range of 35% to 40%. Moderate   diffuse hypokinesis with no identifiable regional variations.   Doppler parameters are consistent with abnormal left ventricular   relaxation (grade 1 diastolic dysfunction). Indeterminate   ventricular filling pressure. Acoustic contrast opacification   revealed no evidence ofthrombus. -  Mitral valve: Mildly calcified annulus. Mildly thickened leaflets   . Mild myxomatous degeneration. Systolic bowing without prolapse.   There was mild to moderate regurgitation directed centrally. - Left atrium: The atrium was mildly dilated. - Pericardium, extracardiac: A trivial pericardial effusion was   identified. There was a left pleural effusion.  Patient Profile     75 yr old male w/ PMH significant for multiple sclerosis (diagnosed in early 30's)with quadriplegia, chronic decubitus ulcer, anemia, osteomyelitis, CKD stage III, HTN, IDDM, and recurrent UTI's who presented with new onset of dyspnea and found to have newly depressed LV systolic function, EF 24-46% (no regional wall motion abnormalities, EF previously 50-55% 09/02/14) and acute on chronic renal failure.   Assessment & Plan    1. Acute systolic heart failure/cardiomyopathy - echo shows EF of 35-40%, previously normal at 50-55% by echo in 09/2014. Given that there are no regional wall motion abnormalities, this may be nonischemic in etiology. EKG shows sinus tachycardia with nonspecific T wave abnormalities in the lateral leads. - with AKI, cannot use ACEI's/ARB's/Spironolactone. ContinueCarvedilol 3.125 mg BID for now.  - Given worsening renal failure, IV Lasix was discontinued on 10/14. With creatinine peaking at 4.15, started on CRRT with improvement in creatinine along with his respiratory status.  - Volume management per Nephrology.   2. New cardiomyopathy, EF 35-40% - given that there are no regional wall motion abnormalities, this may be nonischemic in etiology. EKG shows sinus tachycardia with nonspecific T wave abnormalities in the lateral leads. Troponin elevation peaked at 0.23 and remained flat, consistent with demand ischemia. - Would not pursue coronary angiography at this time given acute on chronic renal failure.  Continue ASA and statin. Plan for inpatient nuclear stress test later this admission or defer to  outpatient setting.   3. HTN - BP has been 124/45 - 156/80 in the past 24 hours. - continue low-dose Coreg. Will not titrate at this time with concurrent CRRT.  4. Acute on chronic renal failure: - creatinine peaked at 4.15 on 02/17/2016, now s/p CRRT given worsening BUN/creat and pulmonary edema. Creatinine improved to 1.78 this AM.   5. R ischial ulcer with osteomyelitis by MRI - ID following. Currently on Daptomycin.    Signed, Erma Heritage, PA  02/19/2016, 10:56 AM   Personally seen and examined. Agree with above. CRRT helped significantly with fluid status, less pulm edema.  EF 40% Creat 1.79 Low dose Coreg. No changes  Candee Furbish, MD

## 2016-02-19 NOTE — Progress Notes (Addendum)
Dr. Jonnie Finner made aware that filter clotted tiwce last night with blood return from only one circuit. After further instruction patient is to be placed back on Heparin with orders for continuous heparin to start at 800 units per hour and continue at that rate unless ACT >220. Will continue to monitor pt. Baseline ACT before heparin was started is 130

## 2016-02-19 NOTE — Progress Notes (Addendum)
0600. CRRT filter needs replacment. Blood returned. Filter changed. Therapy off for approximately 45 minutes during filter change. Will continue to monitor.

## 2016-02-20 DIAGNOSIS — J9601 Acute respiratory failure with hypoxia: Secondary | ICD-10-CM

## 2016-02-20 DIAGNOSIS — J9600 Acute respiratory failure, unspecified whether with hypoxia or hypercapnia: Secondary | ICD-10-CM

## 2016-02-20 DIAGNOSIS — N170 Acute kidney failure with tubular necrosis: Secondary | ICD-10-CM

## 2016-02-20 LAB — RENAL FUNCTION PANEL
ALBUMIN: 2.8 g/dL — AB (ref 3.5–5.0)
Anion gap: 10 (ref 5–15)
BUN: 25 mg/dL — AB (ref 6–20)
CO2: 24 mmol/L (ref 22–32)
Calcium: 8.6 mg/dL — ABNORMAL LOW (ref 8.9–10.3)
Chloride: 102 mmol/L (ref 101–111)
Creatinine, Ser: 1.6 mg/dL — ABNORMAL HIGH (ref 0.61–1.24)
GFR calc Af Amer: 47 mL/min — ABNORMAL LOW (ref >60)
GFR, EST NON AFRICAN AMERICAN: 41 mL/min — AB (ref >60)
GLUCOSE: 139 mg/dL — AB (ref 65–99)
POTASSIUM: 4.3 mmol/L (ref 3.5–5.1)
Phosphorus: 2.4 mg/dL — ABNORMAL LOW (ref 2.5–4.6)
SODIUM: 136 mmol/L (ref 135–145)

## 2016-02-20 LAB — CULTURE, BLOOD (ROUTINE X 2)
CULTURE: NO GROWTH
CULTURE: NO GROWTH

## 2016-02-20 LAB — POCT ACTIVATED CLOTTING TIME
ACTIVATED CLOTTING TIME: 147 s
ACTIVATED CLOTTING TIME: 164 s
ACTIVATED CLOTTING TIME: 164 s
ACTIVATED CLOTTING TIME: 164 s
ACTIVATED CLOTTING TIME: 175 s
ACTIVATED CLOTTING TIME: 180 s
Activated Clotting Time: 169 seconds
Activated Clotting Time: 175 seconds
Activated Clotting Time: 175 seconds

## 2016-02-20 LAB — GLUCOSE, CAPILLARY
GLUCOSE-CAPILLARY: 218 mg/dL — AB (ref 65–99)
GLUCOSE-CAPILLARY: 122 mg/dL — AB (ref 65–99)
GLUCOSE-CAPILLARY: 263 mg/dL — AB (ref 65–99)
Glucose-Capillary: 147 mg/dL — ABNORMAL HIGH (ref 65–99)

## 2016-02-20 LAB — MAGNESIUM: Magnesium: 2.6 mg/dL — ABNORMAL HIGH (ref 1.7–2.4)

## 2016-02-20 LAB — APTT: APTT: 70 s — AB (ref 24–36)

## 2016-02-20 MED ORDER — ASPIRIN 81 MG PO CHEW
81.0000 mg | CHEWABLE_TABLET | Freq: Every day | ORAL | Status: DC
  Administered 2016-02-20 – 2016-02-26 (×7): 81 mg via ORAL
  Filled 2016-02-20 (×8): qty 1

## 2016-02-20 MED ORDER — SODIUM CHLORIDE 0.9 % IV SOLN
600.0000 mg | INTRAVENOUS | Status: DC
  Filled 2016-02-20: qty 12

## 2016-02-20 MED ORDER — POLYETHYLENE GLYCOL 3350 17 G PO PACK
17.0000 g | PACK | Freq: Every day | ORAL | Status: DC | PRN
  Administered 2016-02-25: 17 g via ORAL
  Filled 2016-02-20: qty 1

## 2016-02-20 MED ORDER — HEPARIN SODIUM (PORCINE) 5000 UNIT/ML IJ SOLN
5000.0000 [IU] | Freq: Three times a day (TID) | INTRAMUSCULAR | Status: DC
  Administered 2016-02-20 – 2016-02-26 (×19): 5000 [IU] via SUBCUTANEOUS
  Filled 2016-02-20 (×15): qty 1

## 2016-02-20 NOTE — Progress Notes (Signed)
Patient arrived on unit from [facility].  No family at bedside.  Telemetry placed per MD order and CMT notified.

## 2016-02-20 NOTE — Progress Notes (Signed)
Pharmacy Antibiotic Note  Lance [name] is a 75 y.o. male admitted on 02/13/2016 with new onset dyspnea, and found to have severe bilateral pulmonary edema.  He was continued on Daptomycin per ID starting on 10/14 for MRSA osteomyelitis.  CRRT- CVVHDF was started on 10/16 and stopped on 10/19. Patient to transfer to [facility] for intermittent HD.  Pharmacy has been consulted for antibiotic renal dosing.  Renal function, 10/19: Day #4 of 42 daptomycin - SCr improved on CRRT but unreliable to use to calculate CrCl - CVVHDF - stopped 10/19 at ~0930 -  Anuria - Weight decreased with CRRT  Plan:  Adjust Daptomycin to 8 mg/kg (total weight) after each HD - need to f/u HD schedule  Last dose given 10/19 at 5am (~4hr prior to d/c of CRRT) - will not give supplemental dose  Monitor CK - next check due 10/21  Height: 6\' 4"  (193 cm) Weight: 164 lb 14.5 oz (74.8 kg) IBW/kg (Calculated) : 86.8  Temp (24hrs), Avg:97.8 F (36.6 C), Min:97.5 F (36.4 C), Max:98.1 F (36.7 C)   Recent Labs Lab 02/14/16 0220  02/15/16 0532  02/17/16 0352  02/18/16 0535 02/18/16 1619 02/19/16 0358 02/19/16 0445 02/19/16 1600 02/20/16 0420  WBC  --   < > 11.1*  --  12.6*  --  12.1*  --  9.6 9.5  --   --   CREATININE  --   < > 3.71*  < > 4.15*  < > 2.44* 1.99*  --  1.78* 1.57* 1.60*  LATICACIDVEN 0.7  --   --   --   --   --   --   --   --   --   --   --   < > = values in this interval not displayed.  Estimated Creatinine Clearance: 42.9 mL/min (by C-G formula based on SCr of 1.6 mg/dL (H)).    No Known Allergies  Antimicrobials this admission: Daptomycin 10/14 >> (6 weeks)  Dose adjustments this admission:  Microbiology results: Microbiology results:  10/14 BCx: ngtd  9/28 wound: MRSA, enterococcus faecalis (amp susc) 9/28 urine: Klebsiella, Viridans strep 9/28 blood: NGF   Thank you for allowing pharmacy to be a part of this patient's care.  Doreene Eland, PharmD, BCPS.   Pager:  [phone] 02/20/2016 9:50 AM

## 2016-02-20 NOTE — Progress Notes (Signed)
Patient Name: Lance [name] Date of Encounter: 02/20/2016  Primary Cardiologist: Dr. Cliffton Asters Problem List     Principal Problem:   Acute systolic heart failure [facility]) Active Problems:   Multiple sclerosis (HCC)   Sacral decubitus ulcer   Normocytic anemia   Acute renal failure (ARF) (HCC)   Spastic quadriplegia (HCC)   Chronic kidney disease, stage 3   Essential hypertension, benign   Acute osteomyelitis, pelvis, right (HCC)   Decubitus ulcer of right ischial area   DM (diabetes mellitus) type II controlled with renal manifestation (HCC)   Recurrent UTI (urinary tract infection)   AKI (acute kidney injury) (HCC)   Depression   Elevated troponin   Severe protein-calorie malnutrition (HCC)   Acute pulmonary edema (HCC)   Hypoxia   Decubitus ulcer of trochanter, stage 4 (HCC)   Generalized anxiety disorder   Cardiomyopathy ([place])   Acute renal failure with acute tubular necrosis superimposed on stage 3 chronic kidney disease (HCC)   NICM (nonischemic cardiomyopathy) ([place])    Subjective   Sleepy this AM, but arousable and answering questions appropriately. No chest discomfort or palpitations. Plans are for the patient to transfer to [facility] later today for HD.   Inpatient Medications    Scheduled Meds: . aspirin EC  81 mg Oral Daily  . carvedilol  3.125 mg Oral BID WC  . chlorhexidine  15 mL Mouth Rinse BID  . collagenase   Topical Daily  . DAPTOmycin (CUBICIN)  IV  500 mg Intravenous Q24H  . feeding supplement (ENSURE ENLIVE)  237 mL Oral BID BM  . ferumoxytol  510 mg Intravenous Weekly  . insulin aspart  0-5 Units Subcutaneous QHS  . insulin aspart  0-9 Units Subcutaneous TID WC  . insulin detemir  10 Units Subcutaneous Daily  . mouth rinse  15 mL Mouth Rinse q12n4p  . multivitamin with minerals  1 tablet Oral Daily  . oxybutynin  5 mg Oral BID  . protein supplement shake  11 oz Oral BID BM   Continuous Infusions: . heparin 10,000 units/ 20 mL  infusion syringe 1,850 Units/hr (02/20/16 0816)  . dialysis replacement fluid (prismasate) 400 mL/hr at 02/19/16 2004  . dialysis replacement fluid (prismasate) 200 mL/hr at 02/19/16 2004  . dialysate (PRISMASATE) 2,000 mL/hr at 02/20/16 0649   PRN Meds: acetaminophen **OR** acetaminophen, ALPRAZolam, alteplase, bisacodyl, heparin, heparin, HYDROcodone-acetaminophen, ondansetron **OR** ondansetron (ZOFRAN) IV, sodium chloride   Vital Signs    Vitals:   02/20/16 0600 02/20/16 0700 02/20/16 0710 02/20/16 0800  BP: (!) 149/53  139/63 (!) 144/62  Pulse: 86 81 80 88  Resp: 16 20 (!) 21 17  Temp:      TempSrc:      SpO2: 100% 99% 100% 100%  Weight:      Height:        Intake/Output Summary (Last 24 hours) at 02/20/16 0819 Last data filed at 02/20/16 0800  Gross per 24 hour  Intake              845 ml  Output             3934 ml  Net            -3089 ml   Filed Weights   02/18/16 1400 02/19/16 0230 02/20/16 0400  Weight: 182 lb 15.7 oz (83 kg) 170 lb 10.2 oz (77.4 kg) 164 lb 14.5 oz (74.8 kg)    Physical Exam   GEN: Chronically ill-appearing Caucasian male,  currently in no acute distress.  HEENT: Grossly normal.  Neck: Supple, no JVD, carotid bruits, or masses. Cardiac: RRR, no murmurs, rubs, or gallops. No clubbing, cyanosis, or edema.  Radials/DP/PT 2+ and equal bilaterally.  Respiratory:  Respirations regular and unlabored, clear to auscultation bilaterally. GI: Soft, nontender, nondistended, BS + x 4. MS: no deformity. Chronic atrophy noted secondary to quadriplegia. Skin: warm and dry, no rash. Neuro:  Strength and sensation are intact. Psych: AAOx3.  Normal affect.  Labs    CBC  Recent Labs  02/19/16 0358 02/19/16 0445  WBC 9.6 9.5  NEUTROABS 6.0 5.8  HGB 9.3* 9.2*  HCT 27.5* 27.6*  MCV 90.2 90.8  PLT 243 790   Basic Metabolic Panel  Recent Labs  02/19/16 0445 02/19/16 1600 02/20/16 0420  NA 138 137 136  K 4.5 4.4 4.3  CL 103 101 102  CO2 27 25  24   GLUCOSE 104* 167* 139*  BUN 38* 28* 25*  CREATININE 1.78* 1.57* 1.60*  CALCIUM 8.5* 8.3* 8.6*  MG 2.7*  --  2.6*  PHOS 3.6 2.5 2.4*   Liver Function Tests  Recent Labs  02/19/16 1600 02/20/16 0420  ALBUMIN 2.8* 2.8*   No results for input(s): LIPASE, AMYLASE in the last 72 hours. Cardiac Enzymes No results for input(s): CKTOTAL, CKMB, CKMBINDEX, TROPONINI in the last 72 hours. BNP Invalid input(s): POCBNP D-Dimer No results for input(s): DDIMER in the last 72 hours. Hemoglobin A1C No results for input(s): HGBA1C in the last 72 hours. Fasting Lipid Panel No results for input(s): CHOL, HDL, LDLCALC, TRIG, CHOLHDL, LDLDIRECT in the last 72 hours. Thyroid Function Tests No results for input(s): TSH, T4TOTAL, T3FREE, THYROIDAB in the last 72 hours.  Invalid input(s): FREET3  Telemetry    NSR, HR in 70's - 80's. No atopic events. - Personally Reviewed  ECG   No new tracings.   Radiology    Dg Chest Port 1 View  Result Date: 02/19/2016 CLINICAL DATA:  Pulmonary edema. EXAM: PORTABLE CHEST 1 VIEW COMPARISON:  02/18/2016. FINDINGS: Heart size normal. Persistent but improving bilateral pulmonary infiltrates/edema. Low lung volumes. Persistent pleural effusions, left side greater than right . No pneumothorax. Degenerative changes thoracic spine . IMPRESSION: Persistent but improving bilateral pulmonary infiltrates/edema. Low lung volumes. Persistent pleural effusions, left side greater than right. Electronically Signed   By: Marcello Moores  Register   On: 02/19/2016 07:26   Dg Chest Port 1 View  Result Date: 02/18/2016 CLINICAL DATA:  Acute respiratory distress.  Aspiration. EXAM: PORTABLE CHEST 1 VIEW COMPARISON:  02/18/2016 at 0754 hours FINDINGS: The cardiomediastinal silhouette is unchanged. Pulmonary vascular congestion and diffuse interstitial prominence, slightly increased from prior. Left mid lung and left greater than right basilar airspace opacities have mildly  progressed. Small left pleural effusion may have mildly enlarged. No pneumothorax identified. IMPRESSION: Slight interval worsening of left greater than right lung opacities suggestive of edema, although superimposed infection or aspiration is not excluded. Slightly increased size of small left pleural effusion. Electronically Signed   By: Logan Bores M.D.   On: 02/18/2016 11:15   Dg Chest Port 1 View  Result Date: 02/18/2016 CLINICAL DATA:  Acute respiratory failure.  CHF EXAM: PORTABLE CHEST 1 VIEW COMPARISON:  02/17/2016 FINDINGS: Improvement in bilateral airspace disease compatible with pulmonary edema. There remains mild-to-moderate bilateral edema. Small bilateral effusions and bibasilar atelectasis have improved. IMPRESSION: Improving congestive heart failure with edema and bilateral effusions. Bibasilar consolidation has improved. Electronically Signed   By: Franchot Gallo M.D.  On: 02/18/2016 09:23   Dg Chest Port 1 View  Result Date: 02/17/2016 CLINICAL DATA:  Acute congestive heart failure. Acute renal failure. Worsening shortness of breath and hypoxia. EXAM: PORTABLE CHEST 1 VIEW COMPARISON:  02/14/2016 FINDINGS: Worsening diffuse symmetric bilateral airspace disease is seen, most likely due to worsening pulmonary edema. Bilateral pleural effusions are again noted. No pneumothorax visualized. Heart size is stable. IMPRESSION: Worsening diffuse symmetric airspace disease, most likely due ago pulmonary edema. Bilateral pleural effusions again noted. Electronically Signed   By: Earle Gell M.D.   On: 02/17/2016 14:15    Cardiac Studies   Echocardiogram: 02/14/2016 Study Conclusions  - Left ventricle: The cavity size was normal. Wall thickness was   normal. Systolic function was moderately reduced. The estimated   ejection fraction was in the range of 35% to 40%. Moderate   diffuse hypokinesis with no identifiable regional variations.   Doppler parameters are consistent with abnormal  left ventricular   relaxation (grade 1 diastolic dysfunction). Indeterminate   ventricular filling pressure. Acoustic contrast opacification   revealed no evidence ofthrombus. - Mitral valve: Mildly calcified annulus. Mildly thickened leaflets   . Mild myxomatous degeneration. Systolic bowing without prolapse.   There was mild to moderate regurgitation directed centrally. - Left atrium: The atrium was mildly dilated. - Pericardium, extracardiac: A trivial pericardial effusion was   identified. There was a left pleural effusion.  Patient Profile     75 yr old male w/ PMH significant for multiple sclerosis (diagnosed in early 30's)with quadriplegia, chronic decubitus ulcer, anemia, osteomyelitis, CKD stage III, HTN, IDDM, and recurrent UTI's who presented with new onset of dyspnea and found to have newly depressed LV systolic function, EF 67-20% (no regional wall motion abnormalities, EF previously 50-55% 09/02/14) and acute on chronic renal failure.   Assessment & Plan    1. Acute systolic heart failure/cardiomyopathy - echo shows EF of 35-40%, previously normal at 50-55% by echo in 09/2014. Given that there are no regional wall motion abnormalities, this may be nonischemic in etiology. EKG shows sinus tachycardia with nonspecific T wave abnormalities in the lateral leads. - with AKI, cannot use ACEI's/ARB's/Spironolactone. ContinueCarvedilol 3.125 mg BID for now.  - Given worsening renal failure, IV Lasix was discontinued on 10/14. With creatinine peaking at 4.15, started on CRRT with improvement in creatinine along with his respiratory status. Net -10.9L thus far. - Volume management per Nephrology.   2. New cardiomyopathy, EF 35-40% - given that there are no regional wall motion abnormalities, this may be nonischemic in etiology. EKG shows sinus tachycardia with nonspecific T wave abnormalities in the lateral leads. Troponin elevation peaked at 0.23 and remained flat, consistent with  demand ischemia. - Would not pursue coronary angiography at this time given acute on chronic renal failure.  - Continue ASA and statin. Plan for inpatient nuclear stress test later this admission or defer to outpatient setting.   3. HTN - BP has been 113/45 - 150/71 in the past 24 hours. - continue low-dose Coreg. Nephrology plans on transferring the patient to [facility] for initiation of intermittent HD. Would monitor BP response with HD and if BP allows, titrate Coreg to 6.25mg  BID.   4. Acute on chronic renal failure: - creatinine peaked at 4.15 on 02/17/2016, now s/p CRRT given worsening BUN/creat and pulmonary edema. Creatinine improved to 1.60 this AM.   5. R ischial ulcer with osteomyelitis by MRI - ID following. Currently on Daptomycin.   Signed, Erma Heritage, [place]  02/20/2016,  8:19 AM   Personally seen and examined. Agree with above. To [facility] for HD No change in cardiac meds Will follow  Candee Furbish, MD

## 2016-02-20 NOTE — Progress Notes (Signed)
[facility] Rounding Note  Subjective: net UF 3 L yeseterday, off O2 today (!) and wet cough has improved/ resolved.  No SOB.  Dry mouth, thirsty, BP's dropped to 110's but up now 130's, HR up some to 90's.    Objective Vital signs in last 24 hours: Vitals:   02/20/16 0600 02/20/16 0700 02/20/16 0710 02/20/16 0800  BP: (!) 149/53  139/63 (!) 144/62  Pulse: 86 81 80 88  Resp: 16 20 (!) 21 17  Temp:    97.5 F (36.4 C)  TempSrc:    Oral  SpO2: 100% 99% 100% 100%  Weight:      Height:       Weight change: -8.2 kg (-18 lb 1.2 oz)  Intake/Output Summary (Last 24 hours) at 02/20/16 0854 Last data filed at 02/20/16 0800  Gross per 24 hour  Intake              845 ml  Output             3934 ml  Net            -3089 ml   Physical Exam:  Blood pressure (!) 144/62, pulse 88, temperature 97.5 F (36.4 C), temperature source Oral, resp. rate 17, height 6\' 4"  (1.93 m), weight 74.8 kg (164 lb 14.5 oz), SpO2 100 %. Gen: Tall thin WM, alert, stronger voice and color much better No JVD Chest - R base clear, L base diffuse rhonchi Cor - RRR no MRG  Abd - soft ntnd no mass or ascites Ext - no edema Neuro - awake and alert, moves UE's well, LE's weak  Background:  75 y.o. year-old with PMH significant for DM2, HTN, multiple sclerosis, spastic quadriplegia, urinary retention, h/o decubitus/ischial ulcers, baseline CKD. Recently hospitalized (9/29-10/4) with R ischial pressure ulcer, that was debrided, treated with 6 days of vanco and zosyn (for this plus a klebsiella and strep UTI) Discharged  10/4 (with a creatinine of 1.82) to finish 4 more days of bactrim. Was brought back to the hospital from  [facility] (there for rehab) with new onset dyspnea, and hypoxemia. CXR in the ED showed pulmonary edema, creatinine was noted to be elevated at 3.3 and he was admitted. BNP elevated >900, troponins elevated.   ECHO LV EF 35% - 40% (down from 50-55% 09/2014), moderate diffuse hypokinesis, mild to moderate MR and  Grade 1 diastolic dysfunction.  Assessment: 1. AKI on CKD 3 - unclear cause, +proteinuria ~5gm, microhematuria. Serologies negative so far except ASO +. Cardiorenal component likely.  Could have ICGN from infection. Fluid overload/ pulm edema/ pleur effusions main complication and now seems to be under control.  Poor candidate for immunosuppression so would not do renal biopsy.  Will wait and see if her recovers renal function. So far is anuric.  In the meantime will transition to intermitt HD at [facility]. Have d/w pt, family and primary MD.  2. Pulm edema/ acute resp failure - much better 3. CKD3 - baseline creat 1.8 last admit Oct '17 4. CM EF 35-40% 5. Anemia - Fe def 6. R ischial osteomyelitis - ID started him on daptomycin 7. IDDM  Plan - as above   [name] MD [facility], [place] pgr [phone]   02/18/2016, 8:24 AM  Recent Labs Lab 02/17/16 0352 02/17/16 1740 02/18/16 0535 02/18/16 1619 02/19/16 0445 02/19/16 1600 02/20/16 0420  NA 140 136 139 137 138 137 136  K 5.6* 5.1 4.6 4.7 4.5 4.4 4.3  CL 107  105 105 103 103 101 102  CO2 21* 20* 23 25 27 25 24   GLUCOSE 169* 204* 115* 126* 104* 167* 139*  BUN 116* 119* 70* 47* 38* 28* 25*  CREATININE 4.15* 4.06* 2.44* 1.99* 1.78* 1.57* 1.60*  CALCIUM 8.9 8.4* 8.3* 8.4* 8.5* 8.3* 8.6*  PHOS 9.2* 9.6* 4.3 3.6 3.6 2.5 2.4*     Recent Labs Lab 02/14/16 0020  02/19/16 0445 02/19/16 1600 02/20/16 0420  AST 18  --   --   --   --   ALT 15*  --   --   --   --   ALKPHOS 68  --   --   --   --   BILITOT 0.8  --   --   --   --   PROT 7.0  --   --   --   --   ALBUMIN 2.5*  < > 2.7* 2.8* 2.8*  < > = values in this interval not displayed.   Recent Labs Lab 02/15/16 0532 02/17/16 0352 02/18/16 0535 02/19/16 0358 02/19/16 0445  WBC 11.1* 12.6* 12.1* 9.6 9.5  NEUTROABS 8.1*  --  8.5* 6.0 5.8  HGB 9.4* 9.8* 9.7* 9.3* 9.2*  HCT 27.3* 29.4* 29.9* 27.5* 27.6*  MCV 87.8 89.9 93.7 90.2 90.8  PLT 194 202 224 243 237      Recent Labs Lab 02/14/16 0020 02/14/16 1117 02/15/16 0532 02/15/16 1428  CKTOTAL  --   --   --  33*  TROPONINI 0.15* 0.19* 0.23*  --    CBG:  Recent Labs Lab 02/19/16 0758 02/19/16 1210 02/19/16 1646 02/19/16 2143 02/20/16 0742  GLUCAP 111* 141* 160* 139* 147*    Iron/TIBC/Ferritin/ %Sat    Component Value Date/Time   IRON 28 (L) 02/15/2016 0532   TIBC 193 (L) 02/15/2016 0532   FERRITIN 316 08/31/2014 0036   IRONPCTSAT 14 (L) 02/15/2016 0532   Studies/Results: Dg Chest Port 1 View  Result Date: 02/19/2016 CLINICAL DATA:  Pulmonary edema. EXAM: PORTABLE CHEST 1 VIEW COMPARISON:  02/18/2016. FINDINGS: Heart size normal. Persistent but improving bilateral pulmonary infiltrates/edema. Low lung volumes. Persistent pleural effusions, left side greater than right . No pneumothorax. Degenerative changes thoracic spine . IMPRESSION: Persistent but improving bilateral pulmonary infiltrates/edema. Low lung volumes. Persistent pleural effusions, left side greater than right. Electronically Signed   By: Marcello Moores  Register   On: 02/19/2016 07:26   Dg Chest Port 1 View  Result Date: 02/18/2016 CLINICAL DATA:  Acute respiratory distress.  Aspiration. EXAM: PORTABLE CHEST 1 VIEW COMPARISON:  02/18/2016 at 0754 hours FINDINGS: The cardiomediastinal silhouette is unchanged. Pulmonary vascular congestion and diffuse interstitial prominence, slightly increased from prior. Left mid lung and left greater than right basilar airspace opacities have mildly progressed. Small left pleural effusion may have mildly enlarged. No pneumothorax identified. IMPRESSION: Slight interval worsening of left greater than right lung opacities suggestive of edema, although superimposed infection or aspiration is not excluded. Slightly increased size of small left pleural effusion. Electronically Signed   By: Logan Bores M.D.   On: 02/18/2016 11:15   Medications: . heparin 10,000 units/ 20 mL infusion syringe  1,850 Units/hr (02/20/16 0816)  . dialysis replacement fluid (prismasate) 400 mL/hr at 02/19/16 2004  . dialysis replacement fluid (prismasate) 200 mL/hr at 02/19/16 2004  . dialysate (PRISMASATE) 2,000 mL/hr at 02/20/16 0649   . aspirin EC  81 mg Oral Daily  . carvedilol  3.125 mg Oral BID WC  . chlorhexidine  15 mL  Mouth Rinse BID  . collagenase   Topical Daily  . DAPTOmycin (CUBICIN)  IV  500 mg Intravenous Q24H  . feeding supplement (ENSURE ENLIVE)  237 mL Oral BID BM  . ferumoxytol  510 mg Intravenous Weekly  . insulin aspart  0-5 Units Subcutaneous QHS  . insulin aspart  0-9 Units Subcutaneous TID WC  . insulin detemir  10 Units Subcutaneous Daily  . mouth rinse  15 mL Mouth Rinse q12n4p  . multivitamin with minerals  1 tablet Oral Daily  . oxybutynin  5 mg Oral BID  . protein supplement shake  11 oz Oral BID BM

## 2016-02-20 NOTE — Progress Notes (Signed)
Date:  February 20, 2016 Chart reviewed for concurrent status and case management needs. Will continue to follow the patient for status change: To be transferred to [facility] for HD Discharge Planning: following for needs Expected discharge date: [id number] Velva Harman, BSN, [place], [place]

## 2016-02-20 NOTE — Evaluation (Addendum)
Clinical/Bedside Swallow Evaluation Patient Details  Name: Lance [name] MRN: [id number] Date of Birth: 1940-06-09  Today's Date: 02/20/2016 Time: SLP Start Time (ACUTE ONLY): 8676 SLP Stop Time (ACUTE ONLY): 0958 SLP Time Calculation (min) (ACUTE ONLY): 35 min  Past Medical History:  Past Medical History:  Diagnosis Date  . Acute CHF (congestive heart failure) ([place]) 02/14/2016  . Acute osteomyelitis, pelvis, right ([place])   . Acute renal failure ([place])   . Anemia in chronic renal disease   . Blood transfusion   . Cardiomyopathy ([place]) 02/17/2016  . Chronic spastic paraplegia ([facility])    secondary to MS  . CKD (chronic kidney disease), stage III   . Decreased sensation of lower extremity    due to MS  . Decubitus ulcer of ischium   . Decubitus ulcer of trochanter, stage 4 ([place]) 06/14/2015  . History of MRSA infection    urine  . History of recurrent UTIs   . Hypertension   . MS (multiple sclerosis) ([place])   . Neuralgia neuritis, sciatic nerve    MS for 30 years  . Neurogenic bladder   . PONV (postoperative nausea and vomiting)   . Self-catheterizes urinary bladder   . Type 2 diabetes mellitus ([place])    Past Surgical History:  Past Surgical History:  Procedure Laterality Date  . APPLICATION OF A-CELL OF EXTREMITY Left 09/05/2014   Procedure: PLACEMENT OF ACELL AND VAC;  Surgeon: Theodoro Kos, DO;  Location: [place];  Service: Plastics;  Laterality: Left;  . EXTRACORPOREAL SHOCK WAVE LITHOTRIPSY  03-23-2011  . FINGER SURGERY  2004  . INCISION AND DRAINAGE OF WOUND Left 09/05/2014   Procedure: IRRIGATION AND DEBRIDEMENT OF LEFT ISCHIUM WOUND WITH ;  Surgeon: Theodoro Kos, DO;  Location: [place];  Service: Plastics;  Laterality: Left;  . TONSILLECTOMY    . WOUND DEBRIDEMENT     [facility]2/17[facility]    HPI:  pt is a 75 yo male adm to [facility] with heart failure.  PMH + for MS with spastic quadriparesis, decubititis, recurrent UTIs with foley cath, COPD requiring Bipap.  Pt is to transfer today  to to [facility] for HD.  He is a full code. CXR 10/18 Persistent but improving bilateral pulmonary infiltrates/edema. Low lung volumes, persistent pleural effusions - left more than right.     Assessment / Plan / Recommendation Clinical Impression  Pt presents with gross weakness/deconditioning which may negatively impact airway protection and swallow ability.  Suspect this is a transient dysphagia due to medical condition.   He also does not have a uvula, as he reports it was removed as a child - he denies dysphagia as a result of surgery.  No focal CN deficits noted.  SLP observed pt consuming medicine with RN crushed with puree with use of liquids to aid oral transit, apple juice, Ensure, applesauce and crackers.  Prolonged oral transiting noted,  suspect due to pt intentionally coordinating swallow.  No oral residuals apparent but pt does report sensation of pharyngeal residuals requiring liquids to faciliate clearance.  Multiple swallows noted across all consistencies but no overt indication of aspiration. Subtle throat clearing noted with Ensure, ? Due to secretions retained mixing with Ensure.  Pt has grossly weak cough and hoarseness, which he states is not baseline.    Advised him to strengthen cough and "hock" to facilitate airway clearance and focus on exhalation after swallow to decrease aspiration risk.  Minimal po consumption and dialysis fatigued pt.  As pt is aware of his fatigue  and dysphagia, recommend continue diet to allow him to choose items he may tolerate better.    SlP follow up recommended to faciliate strengthening, pt education and assure po tolerance.     Aspiration Risk    Moderate    Diet Recommendation Regular;Thin liquid   Liquid Administration via: Straw Medication Administration: Crushed with puree (start and follow with liquids) Supervision: Patient able to self feed;Intermittent supervision to cue for compensatory strategies (set up assistance needed) Compensations:  Slow rate;Small sips/bites;Follow solids with liquid (intermittent dry swallow) Postural Changes: Seated upright at 90 degrees;Remain upright for at least 30 minutes after po intake    Other  Recommendations Oral Care Recommendations: Oral care BID   Follow up Recommendations    TBD    Frequency and Duration min 1 x/week  1 week       Prognosis Prognosis for Safe Diet Advancement: Fair Barriers to Reach Goals: Other (Comment) (gross weakness)      Swallow Study   General Date of Onset: 02/20/16 HPI: pt is a 75 yo male adm to [facility] with heart failure.  PMH + for MS with spastic quadriparesis, decubititis, recurrent UTIs with foley cath, COPD requiring Bipap.  Pt is to transfer today to to [facility] for HD.  He is a full code. CXR 10/18 Persistent but improving bilateral pulmonary infiltrates/edema. Low lung volumes, persistent pleural effusions - left more than right.   Type of Study: Bedside Swallow Evaluation Diet Prior to this Study: Regular;Thin liquids Temperature Spikes Noted: No History of Recent Intubation: No Behavior/Cognition: Alert;Cooperative;Pleasant mood Oral Cavity Assessment: Dry Oral Care Completed by SLP: No Oral Cavity - Dentition: Adequate natural dentition Vision: Functional for self-feeding Self-Feeding Abilities: Able to feed self;Needs set up Patient Positioning: Upright in bed Baseline Vocal Quality: Hoarse;Low vocal intensity Volitional Cough: Weak Volitional Swallow: Able to elicit    Oral/Motor/Sensory Function Overall Oral Motor/Sensory Function: Generalized oral weakness (xerostomic, viscous secretions posterior oral cavity, pt cleared with oral suction) Velum:  (pt has no uvula was removed since childhood when had tonsillectomy/adnoidectomy) Mandible: Within Functional Limits   Ice Chips Ice chips: Not tested   Thin Liquid Thin Liquid: Impaired Presentation: Self Fed;Straw Oral Phase Functional Implications: Prolonged oral transit (suspect coordinating  swallow causing delay in transiting) Pharyngeal  Phase Impairments: Suspected delayed Swallow;Multiple swallows    Nectar Thick Nectar Thick Liquid: Impaired (Ensure) Presentation: Straw;Self Fed Oral phase functional implications: Prolonged oral transit (oral coordination) Pharyngeal Phase Impairments: Suspected delayed Swallow;Multiple swallows;Throat Clearing - Delayed (subtle delayed throat clear noted, pt admits to "coating in throat" )   Honey Thick Honey Thick Liquid: Not tested   Puree Puree: Impaired Presentation: Self Fed;Spoon Oral Phase Functional Implications: Prolonged oral transit Pharyngeal Phase Impairments: Suspected delayed Swallow;Multiple swallows;Other (comments) (sensation of residuals requiring liquids to faciliate clearance)   Solid   GO   Solid: Impaired Presentation: Self Fed Oral Phase Functional Implications: Prolonged oral transit Pharyngeal Phase Impairments: Suspected delayed Swallow;Multiple swallows        Luanna Salk, [place] [facility] SLP [phone]

## 2016-02-20 NOTE — Progress Notes (Addendum)
NUTRITION NOTE  Pt seen by another RD on 10/16 for full assessment. Consult placed ~24 hours ago for: protein calorie malnutrition.  Per rounds this AM, SLP saw pt this AM and pt to remain on regular-consistency diet. RN reports that pt has not had a good appetite but that he has been receiving Ensure; orders in place for Ensure Enlive BID and Premier Protein BID.   Pt now off of CRRT and to transfer to [place] at [facility] for likely intermittent HD. Spoke with RD who covers [address] to alert her to patient and medical course. RD at [facility] to see pt tomorrow and change interventions and estimated nutrition needs as needed based on medical course. That RD to also provide renal nutrition information as warranted/desired.   No labs other than CBGs available today. RD at [facility] to monitor if oral nutrition supplements and/or flavors of supplements need to be adjusted once further information is available.    Jarome Matin, MS, RD, LDN Inpatient Clinical Dietitian Pager # [phone] After hours/weekend pager # [phone]

## 2016-02-20 NOTE — Progress Notes (Signed)
Patient now transferred to [facility] for dialysis.  From an ID standpoint he will need 6 of IV daptomycin through November 27th.  Please call if further ID issues arise.  Thanks Scharlene Gloss, MD

## 2016-02-20 NOTE — Progress Notes (Signed)
APTT: 70. RN drew lab sample from purple port on HD catheter. CRRT machine was paused during lab draw. RN flushed with 10 cc normal saline and wasted 10 cc of blood prior to drawing APTT lab sample.

## 2016-02-20 NOTE — Progress Notes (Signed)
PROGRESS NOTE                                                                                                                                                                                                             Patient Demographics:    Lance [name], is a 75 y.o. male, [name] - 1941/04/05, LNL:[id number]  Admit date - 02/13/2016   Admitting Physician Edwin Dada, MD  Outpatient Primary MD for the patient is [name], MD  LOS - 6  Outpatient Specialists:   Chief Complaint  Patient presents with  . Respiratory Distress       Brief Narrative   75 year old male with history of multiple sclerosis, spastic quadriplegia, chronic Foley for urinary retention, chronic kidney disease stage III, diabetes mellitus, hypertension, history of decubitus ulcers with history of ischial osteomyelitis, who was recently hospitalized due to right ischial pressure ulcer requiring debridement and treated with vancomycin and Zosyn. He was then discharged on oral Bactrim (both for the ulcer and Klebsiella/strep UTI). He was discharged to SNF but returned with new onset dyspnea and hypoxemia. Chest x-ray showed pulmonary edema with worsened renal function. He had elevated BNP >900 and mildly elevated troponins. Patient found to have acute cardiomyopathy with acute respiratory failure requiring BiPAP and transferred to ICU. He also developed worsened renal function concerning for cardiorenal syndrome now requiring CRRT.    Subjective:   tolerating CRRT. Respiratory status much better today.    Assessment  & Plan :    Principal Problem:   Acute hypoxic respiratory failure ([place]) Secondary to acute cardiomyopathy with pulmonary edema. now requiring CRRT. Wean oxygen to room air. Pulmonary edema with transudative fluids on CXR, improving with CRRT. Pulmonary consult appreciated. Recommend volume management with  CRRT.   Active Problems: Acute systolic CHF/nonischemic cardiomyopathy EF of 35-40% as per echo. Elevated troponin likely due to demand ischemia. No wall motion abnormalities. On Coreg. Lasix discontinued due to worsening renal function. Not on ACEi/ ARB due to renal failure. Plan for intermittent HD.  Acute on chronic kidney disease Appears baseline creatinine of 1.8. Suspect cardiorenal etiology.ASO titre positive, ? Immune complex glomerulonephritis. Patient started on CRRT,via rt femoral catheter. Tolerated well and volume status improving. [facility].   Decubitus stage IV ulcer with Right ischial osteomyelitis As suggested on MRI. Patient being  followed by plastic surgeon at [facility] for possible muscle flap surgery. Prior culture from ischial wounds grew MRSA and enterococcus. Started on 6 weeks course of IV daptomycin by ID.  Severe protein calorie malnutrition Added supplement.    DM (diabetes mellitus) type II controlled with renal manifestation ([facility]) Currently on reduced dose Levemir 10 units at bedtime with sliding scale coverage. Last A1c of 6.1.    Multiple sclerosis ([place]) With spastic paraplegia diagnosed in mid 2s.  Normocytic anemia At baseline.  Chronic Foley catheter   history of recurrent UTI. On suppressive trimethoprim at home. Continue oxybutynin.       Code Status : Full code  Family Communication  : Daughter updated on phone  Disposition Plan  : transfer to [facility] telemetry for intermittent HD  Barriers For Discharge : Active symptoms  Consults  :   Renal Cardiology Pulmonary ID  Procedures  :  2-D echo CRRT MRI sacrum  DVT Prophylaxis  :  SCDs  Lab Results  Component Value Date   PLT 237 02/19/2016    Antibiotics  :   Anti-infectives    Start     Dose/Rate Route Frequency Ordered Stop   02/21/16 2200  DAPTOmycin (CUBICIN) 600 mg in sodium chloride 0.9 % IVPB     600 mg 224 mL/hr over 30 Minutes  Intravenous Every M-W-F (Hemodialysis) 02/20/16 1001     02/19/16 0600  DAPTOmycin (CUBICIN) 500 mg in sodium chloride 0.9 % IVPB  Status:  Discontinued     500 mg 220 mL/hr over 30 Minutes Intravenous Every 24 hours 02/18/16 1413 02/20/16 1001   02/15/16 1500  DAPTOmycin (CUBICIN) 676 mg in sodium chloride 0.9 % IVPB  Status:  Discontinued     8 mg/kg  84.5 kg 227 mL/hr over 30 Minutes Intravenous Every 48 hours 02/15/16 1407 02/18/16 1413   02/15/16 1400  DAPTOmycin (CUBICIN) 676 mg in sodium chloride 0.9 % IVPB  Status:  Discontinued     8 mg/kg  84.5 kg 227 mL/hr over 30 Minutes Intravenous Every 24 hours 02/15/16 1353 02/15/16 1406   02/14/16 0215  vancomycin (VANCOCIN) IVPB 1000 mg/200 mL premix  Status:  Discontinued     1,000 mg 200 mL/hr over 60 Minutes Intravenous  Once 02/14/16 0212 02/14/16 0243   02/14/16 0145  cefTRIAXone (ROCEPHIN) 1 g in dextrose 5 % 50 mL IVPB     1 g 100 mL/hr over 30 Minutes Intravenous  Once 02/14/16 0144 02/14/16 0232        Objective:   Vitals:   02/20/16 0710 02/20/16 0800 02/20/16 0900 02/20/16 1000  BP: 139/63 (!) 144/62 (!) 136/57 (!) 125/55  Pulse: 80 88 81 (!) 49  Resp: (!) 21 17 19  (!) 23  Temp:  97.5 F (36.4 C)    TempSrc:  Oral    SpO2: 100% 100% 99% 100%  Weight:      Height:        Wt Readings from Last 3 Encounters:  02/20/16 74.8 kg (164 lb 14.5 oz)  02/13/16 85 kg (187 lb 6.4 oz)  02/06/16 79.9 kg (176 lb 3.2 oz)     Intake/Output Summary (Last 24 hours) at 02/20/16 1037 Last data filed at 02/20/16 1000  Gross per 24 hour  Intake              745 ml  Output             3755 ml  Net            -  3010 ml     Physical Exam  Gen: Elderly male appears fatigued HEENT:moist mucosa, supple neck Chest: improved lung breath sounds b/l CVS: N S1&S2, no murmurs, rubs or gallop GI: soft, NT, ND, BS+, chronic Foley Musculoskeletal: warm, sacral decubitus ulcer CNS: Alert and oriented, paraplegic    Data Review:     CBC  Recent Labs Lab 02/14/16 0020  02/15/16 0532 02/17/16 0352 02/18/16 0535 02/19/16 0358 02/19/16 0445  WBC 14.2*  < > 11.1* 12.6* 12.1* 9.6 9.5  HGB 9.7*  < > 9.4* 9.8* 9.7* 9.3* 9.2*  HCT 28.2*  < > 27.3* 29.4* 29.9* 27.5* 27.6*  PLT 224  < > 194 202 224 243 237  MCV 87.0  < > 87.8 89.9 93.7 90.2 90.8  MCH 29.9  < > 30.2 30.0 30.4 30.5 30.3  MCHC 34.4  < > 34.4 33.3 32.4 33.8 33.3  RDW 16.3*  < > 16.9* 17.3* 17.8* 17.5* 17.6*  LYMPHSABS 1.9  --  2.1  --  2.4 2.5 2.5  MONOABS 1.1*  --  0.9  --  1.2* 1.1* 1.2*  EOSABS 0.1  --  0.0  --  0.0 0.0 0.0  BASOSABS 0.1  --  0.0  --  0.0 0.0 0.0  < > = values in this interval not displayed.  Chemistries   Recent Labs Lab 02/14/16 0020  02/18/16 0535 02/18/16 1619 02/19/16 0445 02/19/16 1600 02/20/16 0420  NA 137  < > 139 137 138 137 136  K 4.5  < > 4.6 4.7 4.5 4.4 4.3  CL 112*  < > 105 103 103 101 102  CO2 17*  < > 23 25 27 25 24   GLUCOSE 143*  < > 115* 126* 104* 167* 139*  BUN 90*  < > 70* 47* 38* 28* 25*  CREATININE 3.30*  < > 2.44* 1.99* 1.78* 1.57* 1.60*  CALCIUM 8.4*  < > 8.3* 8.4* 8.5* 8.3* 8.6*  MG  --   --  2.7*  --  2.7*  --  2.6*  AST 18  --   --   --   --   --   --   ALT 15*  --   --   --   --   --   --   ALKPHOS 68  --   --   --   --   --   --   BILITOT 0.8  --   --   --   --   --   --   < > = values in this interval not displayed. ------------------------------------------------------------------------------------------------------------------ No results for input(s): CHOL, HDL, LDLCALC, TRIG, CHOLHDL, LDLDIRECT in the last 72 hours.  Lab Results  Component Value Date   HGBA1C 6.0 (H) 02/14/2016   ------------------------------------------------------------------------------------------------------------------ No results for input(s): TSH, T4TOTAL, T3FREE, THYROIDAB in the last 72 hours.  Invalid input(s):  FREET3 ------------------------------------------------------------------------------------------------------------------ No results for input(s): VITAMINB12, FOLATE, FERRITIN, TIBC, IRON, RETICCTPCT in the last 72 hours.  Coagulation profile No results for input(s): INR, PROTIME in the last 168 hours.  No results for input(s): DDIMER in the last 72 hours.  Cardiac Enzymes  Recent Labs Lab 02/14/16 0020 02/14/16 1117 02/15/16 0532  TROPONINI 0.15* 0.19* 0.23*   ------------------------------------------------------------------------------------------------------------------    Component Value Date/Time   BNP 927.0 (H) 02/14/2016 0020    Inpatient Medications  Scheduled Meds: . aspirin  81 mg Oral Daily  . carvedilol  3.125 mg Oral BID WC  . chlorhexidine  15 mL  Mouth Rinse BID  . collagenase   Topical Daily  . [START ON 02/21/2016] DAPTOmycin (CUBICIN)  IV  600 mg Intravenous Q M,W,F-HD  . feeding supplement (ENSURE ENLIVE)  237 mL Oral BID BM  . ferumoxytol  510 mg Intravenous Weekly  . insulin aspart  0-5 Units Subcutaneous QHS  . insulin aspart  0-9 Units Subcutaneous TID WC  . insulin detemir  10 Units Subcutaneous Daily  . mouth rinse  15 mL Mouth Rinse q12n4p  . multivitamin with minerals  1 tablet Oral Daily  . oxybutynin  5 mg Oral BID  . protein supplement shake  11 oz Oral BID BM   Continuous Infusions:   PRN Meds:.acetaminophen **OR** acetaminophen, ALPRAZolam, bisacodyl, HYDROcodone-acetaminophen, ondansetron **OR** ondansetron (ZOFRAN) IV, polyethylene glycol  Micro Results Recent Results (from the past 240 hour(s))  Culture, blood (Routine X 2) w Reflex to ID Panel     Status: None (Preliminary result)   Collection Time: 02/15/16  1:00 PM  Result Value Ref Range Status   Specimen Description BLOOD RIGHT ARM  2 ML IN YELLOW  Final   Special Requests NONE  Final   Culture   Final    NO GROWTH 4 DAYS Performed at [facility]    Report Status  PENDING  Incomplete  Culture, blood (Routine X 2) w Reflex to ID Panel     Status: None (Preliminary result)   Collection Time: 02/15/16  1:00 PM  Result Value Ref Range Status   Specimen Description BLOOD LEFT ARM  3 ML IN YELLOW  Final   Special Requests NONE  Final   Culture   Final    NO GROWTH 4 DAYS Performed at [facility]    Report Status PENDING  Incomplete    Radiology Reports Dg Chest 2 View  Result Date: 02/14/2016 CLINICAL DATA:  Acute onset of wheezing and shortness of breath. Initial encounter. EXAM: CHEST  2 VIEW COMPARISON:  Chest radiograph performed 08/30/2014 FINDINGS: Vascular congestion is noted, with bilateral central airspace opacification and small bilateral pleural effusions, compatible with pulmonary edema. No pneumothorax is seen. The cardiomediastinal silhouette is borderline normal in size. No acute osseous abnormalities are identified. IMPRESSION: Vascular congestion, with bilateral central airspace opacification and small bilateral pleural effusions, compatible with pulmonary edema. Electronically Signed   By: Garald Balding M.D.   On: 02/14/2016 01:35   US Renal  Result Date: 02/14/2016 CLINICAL DATA:  Acute renal failure. EXAM: RENAL / URINARY TRACT ULTRASOUND COMPLETE COMPARISON:  Renal ultrasound of March 30, 2011. FINDINGS: Right Kidney: Length: 11.2 cm. Two simple cysts are seen in the upper pole, with the largest measuring 2.3 cm. Increased echogenicity of renal parenchyma is noted consistent with medical renal disease. No mass or hydronephrosis visualized. Left Kidney: Length: 10.7 cm. Increased echogenicity of renal parenchyma is noted consistent with medical renal disease. No mass or hydronephrosis visualized. Bladder: Decompressed secondary to Foley catheter. IMPRESSION: Simple right renal cyst. Increased echogenicity of renal parenchyma bilaterally is noted consistent with medical renal disease. Electronically Signed   By: Marijo Conception,  M.D.   On: 02/14/2016 08:36   Mr Sacrum/si Joints Wo Contrast  Result Date: 02/14/2016 CLINICAL DATA:  Right ischial pressure ulcer since September of this year. Wound culture from 01/30/2016 grew MRSA. EXAM: MR SACRUM WITHOUT CONTRAST TECHNIQUE: Multiplanar, multisequence MR imaging was performed. No intravenous contrast was administered. COMPARISON:  MRI of the pelvis 09/01/2014. FINDINGS: There is partial visualization of marrow edema in the  left parasymphyseal pubic bone, unchanged. No fracture is identified. A large ulceration over the right ischial tuberosity extends to bone. The wound appears to be packed. Mildly increased T2 signal and thickening are seen about the margins of the wound which could be due to infection or granulation tissue. A small focus of marrow edema is seen subjacent to the ulcer in the ischium compatible with osteomyelitis. No soft tissue abscess is identified. An ulceration is also seen over the left ischial tuberosity which may be healed. Marked edema is seen in subcutaneous soft tissues over the right greater trochanter. Small focus of mild marrow edema is seen in the underlying greater trochanter. Bone marrow signal is normal in the sacrum. There is no SI joint effusion. Small amount of fluid is present in the hip joints bilaterally most compatible with physiologic change. No muscle or tendon tear is seen. Imaged intrapelvic contents demonstrated a Foley catheter in place. The urinary bladder is decompressed. Its walls appear markedly thickened. IMPRESSION: Large ulceration over the right ischial tuberosity extends to bone. Mild marrow edema is seen in the subjacent ischial tuberosity compatible with osteomyelitis. Subcutaneous edema over the right greater trochanter may be due to cellulitis. Small focus of mild marrow edema in the greater trochanter is worrisome for osteomyelitis. The urinary bladder is nearly completely decompressed but its walls appear markedly thickened  which may be due to chronic bladder outlet obstruction and/or cystitis. Electronically Signed   By: Inge Rise M.D.   On: 02/14/2016 16:03   Dg Chest Port 1 View  Result Date: 02/19/2016 CLINICAL DATA:  Pulmonary edema. EXAM: PORTABLE CHEST 1 VIEW COMPARISON:  02/18/2016. FINDINGS: Heart size normal. Persistent but improving bilateral pulmonary infiltrates/edema. Low lung volumes. Persistent pleural effusions, left side greater than right . No pneumothorax. Degenerative changes thoracic spine . IMPRESSION: Persistent but improving bilateral pulmonary infiltrates/edema. Low lung volumes. Persistent pleural effusions, left side greater than right. Electronically Signed   By: Marcello Moores  Register   On: 02/19/2016 07:26   Dg Chest Port 1 View  Result Date: 02/18/2016 CLINICAL DATA:  Acute respiratory distress.  Aspiration. EXAM: PORTABLE CHEST 1 VIEW COMPARISON:  02/18/2016 at 0754 hours FINDINGS: The cardiomediastinal silhouette is unchanged. Pulmonary vascular congestion and diffuse interstitial prominence, slightly increased from prior. Left mid lung and left greater than right basilar airspace opacities have mildly progressed. Small left pleural effusion may have mildly enlarged. No pneumothorax identified. IMPRESSION: Slight interval worsening of left greater than right lung opacities suggestive of edema, although superimposed infection or aspiration is not excluded. Slightly increased size of small left pleural effusion. Electronically Signed   By: Logan Bores M.D.   On: 02/18/2016 11:15   Dg Chest Port 1 View  Result Date: 02/18/2016 CLINICAL DATA:  Acute respiratory failure.  CHF EXAM: PORTABLE CHEST 1 VIEW COMPARISON:  02/17/2016 FINDINGS: Improvement in bilateral airspace disease compatible with pulmonary edema. There remains mild-to-moderate bilateral edema. Small bilateral effusions and bibasilar atelectasis have improved. IMPRESSION: Improving congestive heart failure with edema and  bilateral effusions. Bibasilar consolidation has improved. Electronically Signed   By: Franchot Gallo M.D.   On: 02/18/2016 09:23   Dg Chest Port 1 View  Result Date: 02/17/2016 CLINICAL DATA:  Acute congestive heart failure. Acute renal failure. Worsening shortness of breath and hypoxia. EXAM: PORTABLE CHEST 1 VIEW COMPARISON:  02/14/2016 FINDINGS: Worsening diffuse symmetric bilateral airspace disease is seen, most likely due to worsening pulmonary edema. Bilateral pleural effusions are again noted. No pneumothorax visualized. Heart size is  stable. IMPRESSION: Worsening diffuse symmetric airspace disease, most likely due ago pulmonary edema. Bilateral pleural effusions again noted. Electronically Signed   By: Earle Gell M.D.   On: 02/17/2016 14:15   Dg Hip Unilat With Pelvis 2-3 Views Right  Result Date: 01/30/2016 CLINICAL DATA:  75 year old with approximate 6 week history of a decubitus ulcer involving the right buttock, with recent worsening of the infection. EXAM: DG HIP (WITH OR WITHOUT PELVIS) 2-3V RIGHT COMPARISON:  Right hip x-rays 05/28/2015.  MRI pelvis 09/01/2014. FINDINGS: No evidence of acute fracture or dislocation. No evidence of osteomyelitis. No visible joint effusion. Moderate joint space narrowing. Bone mineral density well-preserved. Included AP pelvis again shows the healed osteomyelitis with new bone formation at the left ischium. Contralateral left hip joint with symmetric moderate joint space narrowing. Sacroiliac joints and symphysis pubis intact. Mild degenerative changes involving the visualized lower lumbar spine. Penile prosthesis again noted. IMPRESSION: No acute osseous abnormality. No radiographic evidence of acute osteomyelitis. Electronically Signed   By: Evangeline Dakin M.D.   On: 01/30/2016 20:36    Time Spent in minutes  35   Louellen Molder M.D on 02/20/2016 at 10:37 AM  Between 7am to 7pm - Pager - [phone]  After 7pm go to [email or web address] - password  [facility]  [facility] -  Office  [phone]

## 2016-02-20 NOTE — Progress Notes (Signed)
PULMONARY / CRITICAL CARE MEDICINE   Name: Lance [name] MRN: [id number] [name]: [birth year]    ADMISSION DATE:  02/13/2016 CONSULTATION DATE:  02/19/16  REFERRING MD:  Dr. Clementeen Graham / [facility]   CHIEF COMPLAINT:  Respiratory Failure / Volume Overload   SUMMARY:   75 y/o M with PMH of multiple sclerosis (initially dx in his 63's), spastic quadriplegia, IDDM, HTN, urinary retention, decubitus (ischial) ulcers, prior osteomyelitis, baseline CKD (sr cr 1.8 02/2016) and recent hospitalization from 9/29-104 for right ischial pressure ulcers that required debridement 10/2 (6 days vanco / zosyn) and klebsiella / strep UTI (completed 4 more days bactrim) who presented to [facility] on 10/12 from [place] with reports of shortness of breath and desaturations.    Initial evaluation found him to be hypoxic with saturations in low 80's.  He was placed on O2 with relief in symptoms.  On exam he was found to have decreased breath sounds in RLL.  Initial labs - WBC 14.2, Hgb 9.7, platelets 224, Na 137, K 4.5, Cl 112, sr Cr 3.30, albumin 2.5, troponin 0.15, UA was positive for nitrite, large leukocytes.  EKG was within normal limits.  The patient was admitted per [facility] for possible ongoing UTI, volume overload / possible CHF, acute on chronic renal failure.  The patient was diuresed.  2D ECHO was completed and found the patient to have a new reduction of LVEF to 35-40%, moderated diffuse hypokinesis, grade 1 diastolic dysfunction, and small pericardial effusion.  Cardiology was consulted for acute systolic heart failure / cardiomyopathy.  It was felt the patient was not a candidate for LHC due to AKI.  He developed worsening renal failure and volume overload with subsequent respiratory distress.  He was transferred to ICU for BiPAP.  Lasix was discontinued 10/14. Nephrology was consulted and the patient was started on CRRT.  The patient had 7.7L volume removed since admission with significant improvement in respiratory status.   He currently has been weaned to room air.  CXR review notes improvement in pulmonary edema, persistent pleural effusions L>R. WBC range 12 > to 9.5 10/18.  Patient is afebrile.  He is being treated with IV daptomycin for ischial wounds / osteomyelitis found on MR sacrum.      Of note, the patient is followed at [facility] by plastic surgery for possible excision of his sacral ulcer with flap reconstruction.    PCCM consulted 10/18 for evaluation of effusions, respiratory failure.  He was taken off CVVHD 10/19 and pending transfer to [facility] for intermittent HD.    SUBJECTIVE:  RN reports no acute events.  Blood returned on CVVHD, stopping this am with pending transfer to [facility] for intermittent HD.    VITAL SIGNS: BP (!) 136/57 (BP Location: Left Arm)   Pulse 81   Temp 97.5 F (36.4 C) (Oral)   Resp 19   Ht 6\' 4"  (1.93 m)   Wt 164 lb 14.5 oz (74.8 kg)   SpO2 99%   BMI 20.07 kg/m   HEMODYNAMICS:    VENTILATOR SETTINGS:    INTAKE / OUTPUT: I/O last 3 completed shifts: In: 9476 [P.O.:380; Other:655; IV Piggyback:220] Out: 5966 [Urine:15; Other:5951]  PHYSICAL EXAMINATION: General:  Chronically ill appearing male in NAD, lying in bed  Neuro:  AAOx4, speech clear, Generalized weakness but appears stronger 10/19 HEENT:  MM pink/dry, no JVD, improved voice projection intermittently Cardiovascular:  s1s2 rrr, SR on monitor  Lungs:  Even/non-labored, lungs bilaterally clear  Abdomen:  Obese/soft, bsx4 active  Musculoskeletal:  No acute deformities Skin:  R sacral/hip dressing c/d/i  LABS:  BMET  Recent Labs Lab 02/19/16 0445 02/19/16 1600 02/20/16 0420  NA 138 137 136  K 4.5 4.4 4.3  CL 103 101 102  CO2 27 25 24   BUN 38* 28* 25*  CREATININE 1.78* 1.57* 1.60*  GLUCOSE 104* 167* 139*    Electrolytes  Recent Labs Lab 02/18/16 0535  02/19/16 0445 02/19/16 1600 02/20/16 0420  CALCIUM 8.3*  < > 8.5* 8.3* 8.6*  MG 2.7*  --  2.7*  --  2.6*  PHOS 4.3  < > 3.6 2.5 2.4*  < > =  values in this interval not displayed.  CBC  Recent Labs Lab 02/18/16 0535 02/19/16 0358 02/19/16 0445  WBC 12.1* 9.6 9.5  HGB 9.7* 9.3* 9.2*  HCT 29.9* 27.5* 27.6*  PLT 224 243 237    Coag's  Recent Labs Lab 02/18/16 0535 02/19/16 0445 02/20/16 0420  APTT 65* 29 70*    Sepsis Markers  Recent Labs Lab 02/14/16 0220  LATICACIDVEN 0.7    ABG  Recent Labs Lab 02/17/16 1515  PHART 7.234*  PCO2ART 50.1*  PO2ART 156*    Liver Enzymes  Recent Labs Lab 02/14/16 0020  02/19/16 0445 02/19/16 1600 02/20/16 0420  AST 18  --   --   --   --   ALT 15*  --   --   --   --   ALKPHOS 68  --   --   --   --   BILITOT 0.8  --   --   --   --   ALBUMIN 2.5*  < > 2.7* 2.8* 2.8*  < > = values in this interval not displayed.  Cardiac Enzymes  Recent Labs Lab 02/14/16 0020 02/14/16 1117 02/15/16 0532  TROPONINI 0.15* 0.19* 0.23*    Glucose  Recent Labs Lab 02/18/16 2211 02/19/16 0758 02/19/16 1210 02/19/16 1646 02/19/16 2143 02/20/16 0742  GLUCAP 95 111* 141* 160* 139* 147*    Imaging No results found.   STUDIES:  MR Sacrum 10/13 >> ischial tuberosity osteomyelitis, large ulceration, bladder wall thickened concerning for cystitis  Renal US 10/13 >> simple R renal cyst, increased echogenicity of renal parenchyma c/w medical renal disease  CULTURES: BCx2 10/14 >>   ANTIBIOTICS: Vanco 10/13 x1 Ceftriaxone 10/13 >>  Daptomycin 10/14 >>   SIGNIFICANT EVENTS: 9/29-10/4  Admit for R ischial pressure ulcers that required debridement 10/2 and klebsiella / strep UTI  10/12  Admit with SOB, volume overload/pulmoanry edema, new systolic CHF / NICM 85/27  Tx to ICU with resp distress, HD catheter placed, CRRT initiated   LINES/TUBES: R Fem TLC 10/16 >>   DISCUSSION: 75 y/o M with PMH of MS with chronic ischial decubitus ulcers s/p recent debridement admitted 10/12 with SOB in the setting of volume overload secondary to new NICM/systolic CHF (new  reduction in EF to 35-40%).  Developed respiratory distress and transferred to ICU for BiPAP.    ASSESSMENT / PLAN:  PULMONARY A: Acute Respiratory Failure with Hypoxia - in setting of volume overload, cardiomyopathy, clinically improved Bilateral Pleural Effusions - L>R, suspect transudative effusions in the setting of CHF P:   Pulmonary hygiene - IS, mobilize as able / chair position  Volume removal per CRRT / Nephrology  No role for thoracentesis given likely transudative etiology Intermittent CXR  Wean O2 for sats > 92% Intermittent BiPAP for increased WOB Initiated conversation regarding mechanical ventilation 10/18.  He would be difficult  to liberate from the vent under any circumstance given history of MS / overall weakness.  He has a living will that states he does not want "to be on machines indefinitely".  His family is reviewing the living will at his request.  They are actively discussing these issues but he is not ready to make any decisions at this time.   CARDIOVASCULAR A:  Acute Systolic CHF / Cardiomyopathy - suspected NICM as no wall motion abnormalities Cardiomyopathy - new, EF reduction to 35-40% Elevated Troponin - mild, suspect demand ischemia  HTN P:  Monitor in SDU  Continue ASA + Statin  Cardiology following > recommends nuclear stress testing later in admit or as outpatient   RENAL A:   AKI on CKD - baseline sr cr 1.8 02/2016 Suspected Cardiorenal Syndrome  Hyperkalemia  P:   HD per Nephrology  Pending transfer to [facility] for intermittent HD > defer to renal  Monitor BMP  Replace electrolytes as indicated   GASTROINTESTINAL A:   Protein Calorie Malnutrition  P:   Nutrition consult for dietary supplement needs Diet as tolerated   HEMATOLOGIC A:   Anemia of Chronic Disease + IDA  P:  Trend CBC   INFECTIOUS A:   Right Ischial Osteomyelitis  P:   ID following Continue abx as ordered, see above Does not appear to be acutely infected from a  pulmonary perspective (infiltrate) Low pressure mattress  Wound Care   ENDOCRINE A:   IDDM    P:   SSI   NEUROLOGIC A:   Multiple Sclerosis  Spastic Paraplegia  P:   Supportive therapies - low air loss mattress PT as able  Frequent turning with wounds   FAMILY  - Updates:  Patient updated on plan of care at bedside.    - Inter-disciplinary family meet or Palliative Care meeting due by:  Ongoing, followed up with patient regarding code status / overall goals of care. He and his family are reviewing his living will.  They are concerned that his wife has a degree of dementia and can not make decisions for him.  He has 5 children and they are obtaining a copy and discussing with the attorney.  Would recommend followed up conversation with primary MD.  He was hesitant to make a decision regarding ventilation at this time and wanted to consider it further.   PCCM will be available PRN.  Please call back if new needs arise.   Noe Gens, NP-C [facility] Pgr: [phone] or if no answer [phone] 02/20/2016, 9:08 AM  Attending Note:  I have examined patient, reviewed labs, studies and notes. I have discussed the case with B Ollis, and I agree with the data and plans as amended above. 75 yo man, hx MS and associated complications including spastic paraplegia, decubitus ulcers and R hip osteo. Also with hx DM, CKD, recent rx for UTI. Admitted now with acute resp failure w B interstitial / alveolar infiltrates, found to have new acute renal failure, new global LV dysfxn. He required BiPAP support, was continued on dapto and ceftriaxone to complete rx for his UTI and to treat R hip osteo. Started on CVVHD and had 7L + volume removal with improved CXR and resp status. Now resp status improved, on RA. Off CVVHD and will follow to see if he achieves renal recovery. Will need to discuss with him whether he would want long term iHD. We have initiated conversations with him and  family about whether he  would benefit from and therefore undergo intubation / MV should he develop resp failure. They are consider this. I have recommended that he would have a very difficult time being liberated from MV due to neuromuscular disease which may make it an inappropriate course to choose. Please call if we can help any further.   Baltazar Apo, MD, PhD 02/20/2016, 10:46 AM [facility] [phone] or if no answer [phone]

## 2016-02-20 NOTE — Consult Note (Signed)
   [facility] CM Inpatient Consult   02/20/2016  Lance [name] 09/21/1940 [id number]   Patient screened for [facility] Care Management program. Chart reviewed. Noted plan is to transfer to [facility]. Will follow up for potential [place] Management services.   Marthenia Rolling, MSN-Ed, RN,BSN [facility] Liaison [phone]

## 2016-02-21 LAB — RENAL FUNCTION PANEL
ALBUMIN: 2.4 g/dL — AB (ref 3.5–5.0)
ANION GAP: 10 (ref 5–15)
BUN: 52 mg/dL — ABNORMAL HIGH (ref 6–20)
CALCIUM: 8.7 mg/dL — AB (ref 8.9–10.3)
CO2: 24 mmol/L (ref 22–32)
Chloride: 100 mmol/L — ABNORMAL LOW (ref 101–111)
Creatinine, Ser: 3.79 mg/dL — ABNORMAL HIGH (ref 0.61–1.24)
GFR, EST AFRICAN AMERICAN: 17 mL/min — AB (ref >60)
GFR, EST NON AFRICAN AMERICAN: 14 mL/min — AB (ref >60)
GLUCOSE: 321 mg/dL — AB (ref 65–99)
PHOSPHORUS: 3.6 mg/dL (ref 2.5–4.6)
POTASSIUM: 4.5 mmol/L (ref 3.5–5.1)
SODIUM: 134 mmol/L — AB (ref 135–145)

## 2016-02-21 LAB — GLUCOSE, CAPILLARY
Glucose-Capillary: 149 mg/dL — ABNORMAL HIGH (ref 65–99)
Glucose-Capillary: 289 mg/dL — ABNORMAL HIGH (ref 65–99)
Glucose-Capillary: 185 mg/dL — ABNORMAL HIGH (ref 65–99)
Glucose-Capillary: 206 mg/dL — ABNORMAL HIGH (ref 65–99)

## 2016-02-21 MED ORDER — SODIUM CHLORIDE 0.9% FLUSH
10.0000 mL | INTRAVENOUS | Status: DC | PRN
  Administered 2016-02-21: 10 mL
  Administered 2016-02-23: 30 mL
  Filled 2016-02-21 (×2): qty 40

## 2016-02-21 MED ORDER — SODIUM CHLORIDE 0.9 % IV SOLN
600.0000 mg | INTRAVENOUS | Status: DC
  Administered 2016-02-22 – 2016-02-23 (×2): 600 mg via INTRAVENOUS
  Filled 2016-02-21 (×3): qty 12

## 2016-02-21 MED ORDER — NEPRO/CARBSTEADY PO LIQD
237.0000 mL | Freq: Three times a day (TID) | ORAL | Status: DC
  Administered 2016-02-21 – 2016-02-26 (×12): 237 mL via ORAL

## 2016-02-21 MED ORDER — CARVEDILOL 6.25 MG PO TABS
6.2500 mg | ORAL_TABLET | Freq: Two times a day (BID) | ORAL | Status: DC
  Administered 2016-02-21 – 2016-02-26 (×11): 6.25 mg via ORAL
  Filled 2016-02-21 (×11): qty 1
  Filled 2016-02-21: qty 2

## 2016-02-21 NOTE — Progress Notes (Signed)
Nutrition Follow-up  DOCUMENTATION CODES:   Not applicable  INTERVENTION:  Provide Nepro Shake po TID, each supplement provides 425 kcal and 19 grams protein.  Discontinue Ensure and Premier Protein.  Renal diet handout given.   NUTRITION DIAGNOSIS:   Inadequate oral intake related to poor appetite, nausea as evidenced by per patient/family report; improving  GOAL:   Patient will meet greater than or equal to 90% of their needs; progressing  MONITOR:   PO intake, Supplement acceptance, Labs, Weight trends, I & O's, Skin  REASON FOR ASSESSMENT:   Malnutrition Screening Tool, Consult Assessment of nutrition requirement/status  ASSESSMENT:   75 y.o. male with a past medical history significant for MS with quadruplegia, chronic decubitus ulcer, anemia, CKDIII baseline Cr 1.7-1.8, HTN, IDDM and recurrent UTI with indwelling foley catheter who presents with dyspnea. Pt transferred from [facility] for IHD.   No recent meal completion recorded, but pt reports consuming peaches, cereal, and milk this AM. Pt reports appetite has improved. Per SLP, pt reports swallowing has also improved. PT however reports he is unable to determine if po intake at his next meal will be adequate or not. Pt currently has Ensure and Premier protein ordered. Pt has been consuming his Ensure however has been refusing his Premier Protein. Pt currently undergoing HD. RD to switch nutritional supplements to Nepro Shake. RD additionally attempted diet education however pt refused and states to leave him a handout for him and his family to review at a later time. Handout "Eating Healthy with Kidney Disease" given and put on pt bedside table. Pt encouraged to eat his food at meals and to drink his supplements. RD to continue to monitor.   Labs and medications reviewed.   RD unable to diagnosis malnutrition at this time as pt quadriplegic and the observation of fat and muscle mass loss is expected. Weight loss since  admission likely related to fluid status.   Diet Order:  Diet renal/carb modified with fluid restriction Diet-HS Snack? Nothing; Room service appropriate? Yes; Fluid consistency: Thin  Skin:  Wound (see comment) (Unstageable to buttocks)  Last BM:  10/19  Height:   Ht Readings from Last 1 Encounters:  02/20/16 6\' 4"  (1.93 m)    Weight:   Wt Readings from Last 1 Encounters:  02/20/16 165 lb (74.8 kg)    Ideal Body Weight:  91.82 kg  BMI:  Body mass index is 20.08 kg/m.  Estimated Nutritional Needs:   Kcal:  2050-2250  Protein:  100-115 grams  Fluid:  1.2 L/day  EDUCATION NEEDS:   Education needs addressed  Corrin Parker, MS, RD, LDN Pager # [phone] After hours/ weekend pager # [phone]

## 2016-02-21 NOTE — Care Management Important Message (Signed)
Important Message  Patient Details  Name: SUEDE GREENAWALT MRN: [id number] Date of Birth: 04-Feb-1941   Medicare Important Message Given:  Yes    Ryah Cribb, Rory Percy, RN 02/21/2016, 1:43 PM

## 2016-02-21 NOTE — Progress Notes (Signed)
Speech Language Pathology Treatment: Dysphagia  Patient Details Name: Lance [name] MRN: [id number] [name]: 26-Sep-1940 Today's Date: 02/21/2016 Time: 1140-1150 SLP Time Calculation (min) (ACUTE ONLY): 10 min  Assessment / Plan / Recommendation Clinical Impression  Pt seen for dysphagia treatment. Pt tolerated thin liquids with ease and no overt s/s of aspiration. Offered pt several options for solid consistencies to try but pt politely declined. Pt reported feeling "much better" and reported no longer feeling like food was getting stuck since clearing out a lot of phlegm this morning; also reported taking meals "very slow". Reviewed compensatory strategies with pt: small bites/ sips, alternate solids with liquids, sit upright. Pt verbalized understanding of strategies. Pt reported wanting to order fish for all meals because it is softer. Offered to downgrade diet to dysphagia 3, but pt would like to keep regular diet at this time. Continue regular diet, thin liquids, meds crushed in puree, intermittent supervision to cue compensatory strategies. Will sign off at this time; please re-consult if needs arise.   HPI HPI: pt is a 75 yo male adm to [name] with heart failure.  PMH + for MS with spastic quadriparesis, decubititis, recurrent UTIs with foley cath, COPD requiring Bipap.  Pt is to transfer today to to [facility] for HD.  He is a full code. CXR 10/18 Persistent but improving bilateral pulmonary infiltrates/edema. Low lung volumes, persistent pleural effusions - left more than right.        SLP Plan  All goals met     Recommendations  Diet recommendations: Regular;Thin liquid Liquids provided via: Cup;Straw Medication Administration: Crushed with puree Supervision: Patient able to self feed;Intermittent supervision to cue for compensatory strategies Compensations: Slow rate;Small sips/bites;Follow solids with liquid Postural Changes and/or Swallow Maneuvers: Seated upright 90 degrees               Oral Care Recommendations: Oral care BID Follow up Recommendations: None Plan: All goals met       GO                Kern Reap, MA, CCC-SLP 02/21/2016, 12:26 PM [phone]

## 2016-02-21 NOTE — Progress Notes (Signed)
[facility] Rounding Note  Subjective: stable off of supp O2, not making any sig amount of urine.  No sig SOB or other c/o's.  Labs pend  Objective: Vital signs in last 24 hours: Vitals:   02/20/16 1548 02/20/16 1945 02/20/16 2106 02/21/16 0500  BP: (!) 137/49  (!) 125/36 (!) 164/66  Pulse: (!) 55 71 82 (!) 111  Resp: 18 20 18 20   Temp: 98 F (36.7 C)  97.7 F (36.5 C) 98.4 F (36.9 C)  TempSrc: Oral  Oral Oral  SpO2: 94% 94% 97% 93%  Weight: 73.5 kg (162 lb 0.6 oz)  74.8 kg (165 lb)   Height: 6\' 4"  (1.93 m)      Weight change: -1.3 kg (-2 lb 13.9 oz)  Intake/Output Summary (Last 24 hours) at 02/21/16 1205 Last data filed at 02/21/16 0553  Gross per 24 hour  Intake                0 ml  Output                0 ml  Net                0 ml   Physical Exam:  Blood pressure (!) 164/66, pulse (!) 111, temperature 98.4 F (36.9 C), temperature source Oral, resp. rate 20, height 6\' 4"  (1.93 m), weight 74.8 kg (165 lb), SpO2 93 %. Gen: Tall thin WM, alert, stronger voice and color much better No JVD Chest - R base clear, L base diffuse rhonchi Cor - RRR no MRG  Abd - soft ntnd no mass or ascites Ext - no edema Neuro - awake and alert, moves UE's well, LE's weak  Background:  75 y.o. year-old with PMH significant for DM2, HTN, multiple sclerosis, spastic quadriplegia, urinary retention, h/o decubitus/ischial ulcers, baseline CKD. Recently hospitalized (9/29-10/4) with R ischial pressure ulcer, that was debrided, treated with 6 days of vanco and zosyn (for this plus a klebsiella and strep UTI) Discharged  10/4 (with a creatinine of 1.82) to finish 4 more days of bactrim. Was brought back to the hospital from  [facility] (there for rehab) with new onset dyspnea, and hypoxemia. CXR in the ED showed pulmonary edema, creatinine was noted to be elevated at 3.3 and he was admitted. BNP elevated >900, troponins elevated.   ECHO LV EF 35% - 40% (down from 50-55% 09/2014), moderate diffuse hypokinesis,  mild to moderate MR and Grade 1 diastolic dysfunction.  Assessment: 1. AKI on CKD 3 - unclear cause, +proteinuria ~5gm, microhematuria. Serologies negative except ASO +. Cardiorenal component likely.  Could have ICGN from infection as well. Fluid overload/ pulm edema/ pleur effusions main complication and now seems to be under control.  Ongoing serious infections probably preclude immunosuppression so will hold off on kidney biopsy. Manage expectantly and hope for renal recovery. Pt will want to do long-term HD if needed, he has sig physical limitations however gets around OK at baseline in a WC. Cont intermittent HD for now.  2. Pulm edema/ acute resp failure - resolved 3. CKD3 - baseline creat 1.8 last admit Oct '17 4. CM EF 35-40% 5. Anemia - Fe def 6. R ischial osteomyelitis - ID started him on daptomycin 7. IDDM  Plan - reg HD tomorrow. If not recovering function, will plan [facility] placement next week, keep fem cath in for now.    [name] MD [employer] pgr [phone]   02/18/2016, 8:24 AM  Recent Labs Lab  02/17/16 0352 02/17/16 1740 02/18/16 0535 02/18/16 1619 02/19/16 0445 02/19/16 1600 02/20/16 0420  NA 140 136 139 137 138 137 136  K 5.6* 5.1 4.6 4.7 4.5 4.4 4.3  CL 107 105 105 103 103 101 102  CO2 21* 20* 23 25 27 25 24   GLUCOSE 169* 204* 115* 126* 104* 167* 139*  BUN 116* 119* 70* 47* 38* 28* 25*  CREATININE 4.15* 4.06* 2.44* 1.99* 1.78* 1.57* 1.60*  CALCIUM 8.9 8.4* 8.3* 8.4* 8.5* 8.3* 8.6*  PHOS 9.2* 9.6* 4.3 3.6 3.6 2.5 2.4*     Recent Labs Lab 02/19/16 0445 02/19/16 1600 02/20/16 0420  ALBUMIN 2.7* 2.8* 2.8*     Recent Labs Lab 02/15/16 0532 02/17/16 0352 02/18/16 0535 02/19/16 0358 02/19/16 0445  WBC 11.1* 12.6* 12.1* 9.6 9.5  NEUTROABS 8.1*  --  8.5* 6.0 5.8  HGB 9.4* 9.8* 9.7* 9.3* 9.2*  HCT 27.3* 29.4* 29.9* 27.5* 27.6*  MCV 87.8 89.9 93.7 90.2 90.8  PLT 194 202 224 243 237     Recent Labs Lab 02/15/16 0532  02/15/16 1428  CKTOTAL  --  33*  TROPONINI 0.23*  --    CBG:  Recent Labs Lab 02/20/16 1139 02/20/16 1624 02/20/16 2132 02/21/16 0733 02/21/16 1128  GLUCAP 218* 122* 263* 149* 289*    Iron/TIBC/Ferritin/ %Sat    Component Value Date/Time   IRON 28 (L) 02/15/2016 0532   TIBC 193 (L) 02/15/2016 0532   FERRITIN 316 08/31/2014 0036   IRONPCTSAT 14 (L) 02/15/2016 0532   Studies/Results: No results found. Medications:   . aspirin  81 mg Oral Daily  . carvedilol  6.25 mg Oral BID WC  . chlorhexidine  15 mL Mouth Rinse BID  . collagenase   Topical Daily  . DAPTOmycin (CUBICIN)  IV  600 mg Intravenous Q M,W,F-HD  . feeding supplement (ENSURE ENLIVE)  237 mL Oral BID BM  . ferumoxytol  510 mg Intravenous Weekly  . heparin subcutaneous  5,000 Units Subcutaneous Q8H  . insulin aspart  0-5 Units Subcutaneous QHS  . insulin aspart  0-9 Units Subcutaneous TID WC  . insulin detemir  10 Units Subcutaneous Daily  . mouth rinse  15 mL Mouth Rinse q12n4p  . multivitamin with minerals  1 tablet Oral Daily  . oxybutynin  5 mg Oral BID  . protein supplement shake  11 oz Oral BID BM

## 2016-02-21 NOTE — Progress Notes (Signed)
Patient Name: Lance [name] Date of Encounter: 02/21/2016  Primary Cardiologist: Dr. Cliffton Asters Problem List     Principal Problem:   Acute systolic heart failure [facility]) Active Problems:   Multiple sclerosis (HCC)   Sacral decubitus ulcer   Normocytic anemia   Acute renal failure (ARF) (HCC)   Spastic quadriplegia (HCC)   Chronic kidney disease, stage 3   Essential hypertension, benign   Acute osteomyelitis, pelvis, right (HCC)   Decubitus ulcer of right ischial area   DM (diabetes mellitus) type II controlled with renal manifestation (HCC)   Recurrent UTI (urinary tract infection)   AKI (acute kidney injury) (HCC)   Depression   Elevated troponin   Severe protein-calorie malnutrition (HCC)   Acute pulmonary edema (HCC)   Hypoxia   Decubitus ulcer of trochanter, stage 4 (HCC)   Generalized anxiety disorder   Cardiomyopathy ([place])   Acute renal failure with acute tubular necrosis superimposed on stage 3 chronic kidney disease (HCC)   NICM (nonischemic cardiomyopathy) (HCC)   Acute respiratory failure (HCC)    Subjective   Reports sleeping well this AM. Hungry and ready for breakfast. No chest discomfort or palpitations. Breathing at baseline.  Inpatient Medications    Scheduled Meds: . aspirin  81 mg Oral Daily  . carvedilol  3.125 mg Oral BID WC  . chlorhexidine  15 mL Mouth Rinse BID  . collagenase   Topical Daily  . DAPTOmycin (CUBICIN)  IV  600 mg Intravenous Q M,W,F-HD  . feeding supplement (ENSURE ENLIVE)  237 mL Oral BID BM  . ferumoxytol  510 mg Intravenous Weekly  . heparin subcutaneous  5,000 Units Subcutaneous Q8H  . insulin aspart  0-5 Units Subcutaneous QHS  . insulin aspart  0-9 Units Subcutaneous TID WC  . insulin detemir  10 Units Subcutaneous Daily  . mouth rinse  15 mL Mouth Rinse q12n4p  . multivitamin with minerals  1 tablet Oral Daily  . oxybutynin  5 mg Oral BID  . protein supplement shake  11 oz Oral BID BM   Continuous  Infusions:   PRN Meds: acetaminophen **OR** acetaminophen, ALPRAZolam, bisacodyl, HYDROcodone-acetaminophen, ondansetron **OR** ondansetron (ZOFRAN) IV, polyethylene glycol   Vital Signs    Vitals:   02/20/16 1548 02/20/16 1945 02/20/16 2106 02/21/16 0500  BP: (!) 137/49  (!) 125/36 (!) 164/66  Pulse: (!) 55 71 82 (!) 111  Resp: 18 20 18 20   Temp: 98 F (36.7 C)  97.7 F (36.5 C) 98.4 F (36.9 C)  TempSrc: Oral  Oral Oral  SpO2: 94% 94% 97% 93%  Weight: 162 lb 0.6 oz (73.5 kg)  165 lb (74.8 kg)   Height: 6\' 4"  (1.93 m)       Intake/Output Summary (Last 24 hours) at 02/21/16 0741 Last data filed at 02/21/16 0553  Gross per 24 hour  Intake               80 ml  Output              342 ml  Net             -262 ml   Filed Weights   02/20/16 0400 02/20/16 1548 02/20/16 2106  Weight: 164 lb 14.5 oz (74.8 kg) 162 lb 0.6 oz (73.5 kg) 165 lb (74.8 kg)    Physical Exam   GEN: Chronically ill-appearing Caucasian male, currently in no acute distress.  HEENT: Grossly normal.  Neck: Supple, no JVD, carotid bruits, or masses.  Cardiac: RRR, no murmurs, rubs, or gallops. No clubbing, cyanosis, or edema.  Radials/DP/PT 2+ and equal bilaterally.  Respiratory:  Respirations regular and unlabored, clear to auscultation bilaterally. GI: Soft, nontender, nondistended, BS + x 4. MS: no deformity. Chronic atrophy noted secondary to quadriplegia. Skin: warm and dry, no rash. Neuro:  Strength and sensation are intact. Psych: AAOx3.  Normal affect.  Labs    CBC  Recent Labs  02/19/16 0358 02/19/16 0445  WBC 9.6 9.5  NEUTROABS 6.0 5.8  HGB 9.3* 9.2*  HCT 27.5* 27.6*  MCV 90.2 90.8  PLT 243 235   Basic Metabolic Panel  Recent Labs  02/19/16 0445 02/19/16 1600 02/20/16 0420  NA 138 137 136  K 4.5 4.4 4.3  CL 103 101 102  CO2 27 25 24   GLUCOSE 104* 167* 139*  BUN 38* 28* 25*  CREATININE 1.78* 1.57* 1.60*  CALCIUM 8.5* 8.3* 8.6*  MG 2.7*  --  2.6*  PHOS 3.6 2.5 2.4*    Liver Function Tests  Recent Labs  02/19/16 1600 02/20/16 0420  ALBUMIN 2.8* 2.8*     Telemetry    NSR, HR in 70's - 80's. Occasional PAC's. - Personally Reviewed  ECG   No new tracings.   Radiology    Dg Chest Port 1 View  Result Date: 02/19/2016 CLINICAL DATA:  Pulmonary edema. EXAM: PORTABLE CHEST 1 VIEW COMPARISON:  02/18/2016. FINDINGS: Heart size normal. Persistent but improving bilateral pulmonary infiltrates/edema. Low lung volumes. Persistent pleural effusions, left side greater than right . No pneumothorax. Degenerative changes thoracic spine . IMPRESSION: Persistent but improving bilateral pulmonary infiltrates/edema. Low lung volumes. Persistent pleural effusions, left side greater than right. Electronically Signed   By: Marcello Moores  Register   On: 02/19/2016 07:26   Dg Chest Port 1 View  Result Date: 02/18/2016 CLINICAL DATA:  Acute respiratory distress.  Aspiration. EXAM: PORTABLE CHEST 1 VIEW COMPARISON:  02/18/2016 at 0754 hours FINDINGS: The cardiomediastinal silhouette is unchanged. Pulmonary vascular congestion and diffuse interstitial prominence, slightly increased from prior. Left mid lung and left greater than right basilar airspace opacities have mildly progressed. Small left pleural effusion may have mildly enlarged. No pneumothorax identified. IMPRESSION: Slight interval worsening of left greater than right lung opacities suggestive of edema, although superimposed infection or aspiration is not excluded. Slightly increased size of small left pleural effusion. Electronically Signed   By: Logan Bores M.D.   On: 02/18/2016 11:15   Dg Chest Port 1 View  Result Date: 02/18/2016 CLINICAL DATA:  Acute respiratory failure.  CHF EXAM: PORTABLE CHEST 1 VIEW COMPARISON:  02/17/2016 FINDINGS: Improvement in bilateral airspace disease compatible with pulmonary edema. There remains mild-to-moderate bilateral edema. Small bilateral effusions and bibasilar atelectasis have  improved. IMPRESSION: Improving congestive heart failure with edema and bilateral effusions. Bibasilar consolidation has improved. Electronically Signed   By: Franchot Gallo M.D.   On: 02/18/2016 09:23   Dg Chest Port 1 View  Result Date: 02/17/2016 CLINICAL DATA:  Acute congestive heart failure. Acute renal failure. Worsening shortness of breath and hypoxia. EXAM: PORTABLE CHEST 1 VIEW COMPARISON:  02/14/2016 FINDINGS: Worsening diffuse symmetric bilateral airspace disease is seen, most likely due to worsening pulmonary edema. Bilateral pleural effusions are again noted. No pneumothorax visualized. Heart size is stable. IMPRESSION: Worsening diffuse symmetric airspace disease, most likely due ago pulmonary edema. Bilateral pleural effusions again noted. Electronically Signed   By: Earle Gell M.D.   On: 02/17/2016 14:15    Cardiac Studies   Echocardiogram: 02/14/2016  Study Conclusions  - Left ventricle: The cavity size was normal. Wall thickness was   normal. Systolic function was moderately reduced. The estimated   ejection fraction was in the range of 35% to 40%. Moderate   diffuse hypokinesis with no identifiable regional variations.   Doppler parameters are consistent with abnormal left ventricular   relaxation (grade 1 diastolic dysfunction). Indeterminate   ventricular filling pressure. Acoustic contrast opacification   revealed no evidence ofthrombus. - Mitral valve: Mildly calcified annulus. Mildly thickened leaflets   . Mild myxomatous degeneration. Systolic bowing without prolapse.   There was mild to moderate regurgitation directed centrally. - Left atrium: The atrium was mildly dilated. - Pericardium, extracardiac: A trivial pericardial effusion was   identified. There was a left pleural effusion.  Patient Profile     75 yr old male w/ PMH significant for multiple sclerosis (diagnosed in early 30's)with quadriplegia, chronic decubitus ulcer, anemia, osteomyelitis, CKD  stage III, HTN, IDDM, and recurrent UTI's who presented with new onset of dyspnea and found to have newly depressed LV systolic function, EF 41-32% (no regional wall motion abnormalities, EF previously 50-55% 09/02/14) and acute on chronic renal failure.   Assessment & Plan    1. Acute systolic heart failure/cardiomyopathy - echo shows EF of 35-40%, previously normal at 50-55% by echo in 09/2014. Given that there are no regional wall motion abnormalities, this may be nonischemic in etiology. EKG shows sinus tachycardia with nonspecific T wave abnormalities in the lateral leads. - with AKI, cannot use ACEI's/ARB's/Spironolactone.   - Given worsening renal failure, IV Lasix was discontinued on 10/14. With creatinine peaking at 4.15, started on CRRT with improvement in creatinine along with his respiratory status. Net -11.0L thus far. For IHD. - Volume management per Nephrology.   2. New cardiomyopathy, EF 35-40% - given that there are no regional wall motion abnormalities, this may be nonischemic in etiology. EKG shows sinus tachycardia with nonspecific T wave abnormalities in the lateral leads. Troponin elevation peaked at 0.23 and remained flat, consistent with demand ischemia. - Would not pursue coronary angiography at this time given acute on chronic renal failure.  - Continue ASA and statin. Plan for inpatient nuclear stress test later as outpatient one over this acute illness if his clinical status warrants. For now med mgt.    3. HTN - BP has been 121/36 - 176/66 in the past 24 hours. - continue low-dose Coreg. Has been transferred to [facility] for HD. Would monitor BP response with HD and if BP allows, titrate Coreg to 6.25mg  BID.   4. Acute on chronic renal failure: - creatinine peaked at 4.15 on 02/17/2016, now s/p CRRT given worsening BUN/creat and pulmonary edema. Creatinine improved to 1.60 on 10/19.   5. R ischial ulcer with osteomyelitis by MRI - ID following. Currently on Daptomycin.     Signed, Erma Heritage, PA  02/21/2016, 7:41 AM   Personally seen and examined. Agree with above.  Increased coreg to 6.25 BID At this point, no further new cardiac recommendations. Will sign off. Please call if any ?Candee Furbish, MD

## 2016-02-21 NOTE — Care Management Note (Signed)
Case Management Note  Patient Details  Name: SUVAN STCYR MRN: [id number] Date of Birth: 1940/07/07  Subjective/Objective:     CM following for progression and d/c planning.                Action/Plan: 02/21/2016 Pt transferred from [facility] for HD as needed. CM will follow for progress and d/c planning. Noted earlier orders for [facility], however pt is SNF resident. Unclear how mobile this pt was before admission,, need to eval this to determine if pt would be able to travel to HD after d/c if this should be necessary.   Expected Discharge Date:                  Expected Discharge Plan:  Skilled Nursing Facility  In-House Referral:  Clinical Social Work  Discharge planning Services  CM Consult  Post Acute Care Choice:  NA Choice offered to:  NA  DME Arranged:  N/A DME Agency:  NA  HH Arranged:  NA HH Agency:  NA  Status of Service:  Completed, signed off  If discussed at [place] of Stay Meetings, dates discussed:    Additional Comments:  Adron Bene, RN 02/21/2016, 11:32 AM

## 2016-02-21 NOTE — Progress Notes (Signed)
PROGRESS NOTE                                                                                                                                                                                                             Patient Demographics:    Lance [name], is a 75 y.o. male, [name] - 09/20/1940, KVQ:[id number]  Admit date - 02/13/2016   Admitting Physician Edwin Dada, MD  Outpatient Primary MD for the patient is [name], MD  LOS - 7  Outpatient Specialists:   Chief Complaint  Patient presents with  . Respiratory Distress       Brief Narrative   75 year old male with history of multiple sclerosis, spastic quadriplegia, chronic Foley for urinary retention, chronic kidney disease stage III, diabetes mellitus, hypertension, history of decubitus ulcers with history of ischial osteomyelitis, who was recently hospitalized due to right ischial pressure ulcer requiring debridement and treated with vancomycin and Zosyn. He was then discharged on oral Bactrim (both for the ulcer and Klebsiella/strep UTI). He was discharged to SNF but returned with new onset dyspnea and hypoxemia. Chest x-ray showed pulmonary edema with worsened renal function. He had elevated BNP >900 and mildly elevated troponins. Patient found to have acute cardiomyopathy with acute respiratory failure requiring BiPAP and transferred to ICU. He also developed worsened renal function concerning for cardiorenal syndrome now requiring CRRT.    Subjective:   tolerating CRRT. Respiratory status much better today.    Assessment  & Plan :    Principal Problem:   Acute hypoxic respiratory failure ([place]) Secondary to acute cardiomyopathy with pulmonary edema. now requiring CRRT. Wean oxygen to room air. Pulmonary edema with transudative fluids on CXR, improving with CRRT. Pulmonary consult appreciated. Recommend volume management with  CRRT.  Active Problems: Acute systolic CHF/nonischemic cardiomyopathy -EF of 35-40% as per echo. Elevated troponin likely due to demand ischemia. No wall motion abnormalities. -On Coreg. Lasix discontinued due to worsening renal function. Not on ACEi/ ARB due to renal failure. -continue statins and ASA -Plan for intermittent HD for volume control.  Acute on chronic kidney disease (stage 3-4 at baseline) -Appears baseline creatinine of 1.8. Suspect cardiorenal etiology. ASO titre positive, ? Immune complex glomerulonephritis. -Patient started on CRRT, via rt femoral catheter. Tolerated well and volume status improved.  -[facility]; expectations  is for kidney recovery. -Cr 3. Decubitus stage IV ulcer with Right ischial osteomyelitis -As suggested on MRI. Patient being followed by plastic surgeon at [facility] for possible muscle flap surgery at some point. -Prior culture from ischial wounds grew MRSA and enterococcus. Started on 6 weeks course of IV daptomycin by ID recommendations. -no fever and non-toxic currently 3.79 (10/20)  Severe protein calorie malnutrition -Added supplement. -nutritonal service consulted  DM (diabetes mellitus) type II controlled with renal manifestation ([facility]) -Currently on reduced dose Levemir 10 units at bedtime with sliding scale coverage. -Last A1c of 6.1. -will follow CBG's and adjust hypoglycemic regimen as needed   Multiple sclerosis ([place]) -With spastic paraplegia diagnosed in mid 9s. -limited mobility and essentially wheelchair  Bound   Normocytic anemia -At baseline. -IV iron and aranesp as per renal discretion -no signs of acute GI bleed  Chronic Foley catheter  -history of recurrent UTI.  -On suppressive trimethoprim at home.  -Continue oxybutynin.  Code Status : Full code  Family Communication  : friend at bedside   Disposition Plan  :remains inpatient; will follow rec's from renal service    Barriers For Discharge :receiving HD intermittently; kidneys still not recovered and volume status still been adjusted.  Consults  :   Renal Cardiology Pulmonary ID  Procedures  :  2-D echo CRRT Now receiving intermittent HD MRI sacrum  DVT Prophylaxis  :  SCDs  Lab Results  Component Value Date   PLT 237 02/19/2016    Antibiotics  :   Anti-infectives    Start     Dose/Rate Route Frequency Ordered Stop   02/22/16 2200  DAPTOmycin (CUBICIN) 600 mg in sodium chloride 0.9 % IVPB     600 mg 224 mL/hr over 30 Minutes Intravenous Every T-Th-Sa (Hemodialysis) 02/21/16 1324     02/21/16 2200  DAPTOmycin (CUBICIN) 600 mg in sodium chloride 0.9 % IVPB  Status:  Discontinued     600 mg 224 mL/hr over 30 Minutes Intravenous Every M-W-F (Hemodialysis) 02/20/16 1001 02/21/16 1324   02/19/16 0600  DAPTOmycin (CUBICIN) 500 mg in sodium chloride 0.9 % IVPB  Status:  Discontinued     500 mg 220 mL/hr over 30 Minutes Intravenous Every 24 hours 02/18/16 1413 02/20/16 1001   02/15/16 1500  DAPTOmycin (CUBICIN) 676 mg in sodium chloride 0.9 % IVPB  Status:  Discontinued     8 mg/kg  84.5 kg 227 mL/hr over 30 Minutes Intravenous Every 48 hours 02/15/16 1407 02/18/16 1413   02/15/16 1400  DAPTOmycin (CUBICIN) 676 mg in sodium chloride 0.9 % IVPB  Status:  Discontinued     8 mg/kg  84.5 kg 227 mL/hr over 30 Minutes Intravenous Every 24 hours 02/15/16 1353 02/15/16 1406   02/14/16 0215  vancomycin (VANCOCIN) IVPB 1000 mg/200 mL premix  Status:  Discontinued     1,000 mg 200 mL/hr over 60 Minutes Intravenous  Once 02/14/16 0212 02/14/16 0243   02/14/16 0145  cefTRIAXone (ROCEPHIN) 1 g in dextrose 5 % 50 mL IVPB     1 g 100 mL/hr over 30 Minutes Intravenous  Once 02/14/16 0144 02/14/16 0232        Objective:   Vitals:   02/20/16 1548 02/20/16 1945 02/20/16 2106 02/21/16 0500  BP: (!) 137/49  (!) 125/36 (!) 164/66  Pulse: (!) 55 71 82 (!) 111  Resp: 18 20 18 20   Temp: 98 F (36.7 C)   97.7 F (36.5 C) 98.4 F (36.9 C)  TempSrc: Oral  Oral Oral  SpO2: 94% 94% 97% 93%  Weight: 73.5 kg (162 lb 0.6 oz)  74.8 kg (165 lb)   Height: 6\' 4"  (1.93 m)       Wt Readings from Last 3 Encounters:  02/20/16 74.8 kg (165 lb)  02/13/16 85 kg (187 lb 6.4 oz)  02/06/16 79.9 kg (176 lb 3.2 oz)     Intake/Output Summary (Last 24 hours) at 02/21/16 1410 Last data filed at 02/21/16 0553  Gross per 24 hour  Intake                0 ml  Output                0 ml  Net                0 ml     Physical Exam  Gen: Elderly male appears frail and chronically ill; afebrile and no complaining of CP or SOB. HEENT: moist mucosa, supple neck, no JVD appreciated  Chest: improved lung breath sounds b/l, no frank crackles or wheezing  CVS: N S1&S2, no murmurs, rubs or gallop GI: soft, NT, ND, BS+, chronic Foley Musculoskeletal: warm, sacral decubitus ulcer appreciated, positive serosanguineous drainage  CNS: Alert and oriented, paraplegic, able to follow commands.    Data Review:    CBC  Recent Labs Lab 02/15/16 0532 02/17/16 0352 02/18/16 0535 02/19/16 0358 02/19/16 0445  WBC 11.1* 12.6* 12.1* 9.6 9.5  HGB 9.4* 9.8* 9.7* 9.3* 9.2*  HCT 27.3* 29.4* 29.9* 27.5* 27.6*  PLT 194 202 224 243 237  MCV 87.8 89.9 93.7 90.2 90.8  MCH 30.2 30.0 30.4 30.5 30.3  MCHC 34.4 33.3 32.4 33.8 33.3  RDW 16.9* 17.3* 17.8* 17.5* 17.6*  LYMPHSABS 2.1  --  2.4 2.5 2.5  MONOABS 0.9  --  1.2* 1.1* 1.2*  EOSABS 0.0  --  0.0 0.0 0.0  BASOSABS 0.0  --  0.0 0.0 0.0    Chemistries   Recent Labs Lab 02/18/16 0535 02/18/16 1619 02/19/16 0445 02/19/16 1600 02/20/16 0420 02/21/16 1119  NA 139 137 138 137 136 134*  K 4.6 4.7 4.5 4.4 4.3 4.5  CL 105 103 103 101 102 100*  CO2 23 25 27 25 24 24   GLUCOSE 115* 126* 104* 167* 139* 321*  BUN 70* 47* 38* 28* 25* 52*  CREATININE 2.44* 1.99* 1.78* 1.57* 1.60* 3.79*  CALCIUM 8.3* 8.4* 8.5* 8.3* 8.6* 8.7*  MG 2.7*  --  2.7*  --  2.6*  --     ------------------------------------------------------------------------------------------------------------------  Lab Results  Component Value Date   HGBA1C 6.0 (H) 02/14/2016   ------------------------------------------------------------------------------------------------------------------ No results for input(s): TSH, T4TOTAL, T3FREE, THYROIDAB in the last 72 hours.  Invalid input(s): FREET3 ------------------------------------------------------------------------------------------------------------------  Cardiac Enzymes  Recent Labs Lab 02/15/16 0532  TROPONINI 0.23*   ------------------------------------------------------------------------------------------------------------------    Component Value Date/Time   BNP 927.0 (H) 02/14/2016 0020    Inpatient Medications  Scheduled Meds: . aspirin  81 mg Oral Daily  . carvedilol  6.25 mg Oral BID WC  . chlorhexidine  15 mL Mouth Rinse BID  . collagenase   Topical Daily  . [START ON 02/22/2016] DAPTOmycin (CUBICIN)  IV  600 mg Intravenous Q T,Th,Sa-HD  . feeding supplement (NEPRO CARB STEADY)  237 mL Oral TID BM  . ferumoxytol  510 mg Intravenous Weekly  . heparin subcutaneous  5,000 Units Subcutaneous Q8H  . insulin aspart  0-5 Units Subcutaneous QHS  . insulin aspart  0-9  Units Subcutaneous TID WC  . insulin detemir  10 Units Subcutaneous Daily  . mouth rinse  15 mL Mouth Rinse q12n4p  . multivitamin with minerals  1 tablet Oral Daily  . oxybutynin  5 mg Oral BID   Continuous Infusions:   PRN Meds:.acetaminophen **OR** acetaminophen, ALPRAZolam, bisacodyl, HYDROcodone-acetaminophen, ondansetron **OR** ondansetron (ZOFRAN) IV, polyethylene glycol, sodium chloride flush  Micro Results Recent Results (from the past 240 hour(s))  Culture, blood (Routine X 2) w Reflex to ID Panel     Status: None   Collection Time: 02/15/16  1:00 PM  Result Value Ref Range Status   Specimen Description BLOOD RIGHT ARM  2 ML IN  YELLOW  Final   Special Requests NONE  Final   Culture   Final    NO GROWTH 5 DAYS Performed at [facility]    Report Status 02/20/2016 FINAL  Final  Culture, blood (Routine X 2) w Reflex to ID Panel     Status: None   Collection Time: 02/15/16  1:00 PM  Result Value Ref Range Status   Specimen Description BLOOD LEFT ARM  3 ML IN YELLOW  Final   Special Requests NONE  Final   Culture   Final    NO GROWTH 5 DAYS Performed at [facility]    Report Status 02/20/2016 FINAL  Final    Radiology Reports Dg Chest 2 View  Result Date: 02/14/2016 CLINICAL DATA:  Acute onset of wheezing and shortness of breath. Initial encounter. EXAM: CHEST  2 VIEW COMPARISON:  Chest radiograph performed 08/30/2014 FINDINGS: Vascular congestion is noted, with bilateral central airspace opacification and small bilateral pleural effusions, compatible with pulmonary edema. No pneumothorax is seen. The cardiomediastinal silhouette is borderline normal in size. No acute osseous abnormalities are identified. IMPRESSION: Vascular congestion, with bilateral central airspace opacification and small bilateral pleural effusions, compatible with pulmonary edema. Electronically Signed   By: Garald Balding M.D.   On: 02/14/2016 01:35   US Renal  Result Date: 02/14/2016 CLINICAL DATA:  Acute renal failure. EXAM: RENAL / URINARY TRACT ULTRASOUND COMPLETE COMPARISON:  Renal ultrasound of March 30, 2011. FINDINGS: Right Kidney: Length: 11.2 cm. Two simple cysts are seen in the upper pole, with the largest measuring 2.3 cm. Increased echogenicity of renal parenchyma is noted consistent with medical renal disease. No mass or hydronephrosis visualized. Left Kidney: Length: 10.7 cm. Increased echogenicity of renal parenchyma is noted consistent with medical renal disease. No mass or hydronephrosis visualized. Bladder: Decompressed secondary to Foley catheter. IMPRESSION: Simple right renal cyst. Increased echogenicity  of renal parenchyma bilaterally is noted consistent with medical renal disease. Electronically Signed   By: Marijo Conception, M.D.   On: 02/14/2016 08:36   Mr Sacrum/si Joints Wo Contrast  Result Date: 02/14/2016 CLINICAL DATA:  Right ischial pressure ulcer since September of this year. Wound culture from 01/30/2016 grew MRSA. EXAM: MR SACRUM WITHOUT CONTRAST TECHNIQUE: Multiplanar, multisequence MR imaging was performed. No intravenous contrast was administered. COMPARISON:  MRI of the pelvis 09/01/2014. FINDINGS: There is partial visualization of marrow edema in the left parasymphyseal pubic bone, unchanged. No fracture is identified. A large ulceration over the right ischial tuberosity extends to bone. The wound appears to be packed. Mildly increased T2 signal and thickening are seen about the margins of the wound which could be due to infection or granulation tissue. A small focus of marrow edema is seen subjacent to the ulcer in the ischium compatible with osteomyelitis. No soft tissue  abscess is identified. An ulceration is also seen over the left ischial tuberosity which may be healed. Marked edema is seen in subcutaneous soft tissues over the right greater trochanter. Small focus of mild marrow edema is seen in the underlying greater trochanter. Bone marrow signal is normal in the sacrum. There is no SI joint effusion. Small amount of fluid is present in the hip joints bilaterally most compatible with physiologic change. No muscle or tendon tear is seen. Imaged intrapelvic contents demonstrated a Foley catheter in place. The urinary bladder is decompressed. Its walls appear markedly thickened. IMPRESSION: Large ulceration over the right ischial tuberosity extends to bone. Mild marrow edema is seen in the subjacent ischial tuberosity compatible with osteomyelitis. Subcutaneous edema over the right greater trochanter may be due to cellulitis. Small focus of mild marrow edema in the greater trochanter is  worrisome for osteomyelitis. The urinary bladder is nearly completely decompressed but its walls appear markedly thickened which may be due to chronic bladder outlet obstruction and/or cystitis. Electronically Signed   By: Inge Rise M.D.   On: 02/14/2016 16:03   Dg Chest Port 1 View  Result Date: 02/19/2016 CLINICAL DATA:  Pulmonary edema. EXAM: PORTABLE CHEST 1 VIEW COMPARISON:  02/18/2016. FINDINGS: Heart size normal. Persistent but improving bilateral pulmonary infiltrates/edema. Low lung volumes. Persistent pleural effusions, left side greater than right . No pneumothorax. Degenerative changes thoracic spine . IMPRESSION: Persistent but improving bilateral pulmonary infiltrates/edema. Low lung volumes. Persistent pleural effusions, left side greater than right. Electronically Signed   By: Marcello Moores  Register   On: 02/19/2016 07:26   Dg Chest Port 1 View  Result Date: 02/18/2016 CLINICAL DATA:  Acute respiratory distress.  Aspiration. EXAM: PORTABLE CHEST 1 VIEW COMPARISON:  02/18/2016 at 0754 hours FINDINGS: The cardiomediastinal silhouette is unchanged. Pulmonary vascular congestion and diffuse interstitial prominence, slightly increased from prior. Left mid lung and left greater than right basilar airspace opacities have mildly progressed. Small left pleural effusion may have mildly enlarged. No pneumothorax identified. IMPRESSION: Slight interval worsening of left greater than right lung opacities suggestive of edema, although superimposed infection or aspiration is not excluded. Slightly increased size of small left pleural effusion. Electronically Signed   By: Logan Bores M.D.   On: 02/18/2016 11:15   Dg Chest Port 1 View  Result Date: 02/18/2016 CLINICAL DATA:  Acute respiratory failure.  CHF EXAM: PORTABLE CHEST 1 VIEW COMPARISON:  02/17/2016 FINDINGS: Improvement in bilateral airspace disease compatible with pulmonary edema. There remains mild-to-moderate bilateral edema. Small  bilateral effusions and bibasilar atelectasis have improved. IMPRESSION: Improving congestive heart failure with edema and bilateral effusions. Bibasilar consolidation has improved. Electronically Signed   By: Franchot Gallo M.D.   On: 02/18/2016 09:23   Dg Chest Port 1 View  Result Date: 02/17/2016 CLINICAL DATA:  Acute congestive heart failure. Acute renal failure. Worsening shortness of breath and hypoxia. EXAM: PORTABLE CHEST 1 VIEW COMPARISON:  02/14/2016 FINDINGS: Worsening diffuse symmetric bilateral airspace disease is seen, most likely due to worsening pulmonary edema. Bilateral pleural effusions are again noted. No pneumothorax visualized. Heart size is stable. IMPRESSION: Worsening diffuse symmetric airspace disease, most likely due ago pulmonary edema. Bilateral pleural effusions again noted. Electronically Signed   By: Earle Gell M.D.   On: 02/17/2016 14:15   Dg Hip Unilat With Pelvis 2-3 Views Right  Result Date: 01/30/2016 CLINICAL DATA:  75 year old with approximate 6 week history of a decubitus ulcer involving the right buttock, with recent worsening of the infection. EXAM:  DG HIP (WITH OR WITHOUT PELVIS) 2-3V RIGHT COMPARISON:  Right hip x-rays 05/28/2015.  MRI pelvis 09/01/2014. FINDINGS: No evidence of acute fracture or dislocation. No evidence of osteomyelitis. No visible joint effusion. Moderate joint space narrowing. Bone mineral density well-preserved. Included AP pelvis again shows the healed osteomyelitis with new bone formation at the left ischium. Contralateral left hip joint with symmetric moderate joint space narrowing. Sacroiliac joints and symphysis pubis intact. Mild degenerative changes involving the visualized lower lumbar spine. Penile prosthesis again noted. IMPRESSION: No acute osseous abnormality. No radiographic evidence of acute osteomyelitis. Electronically Signed   By: Evangeline Dakin M.D.   On: 01/30/2016 20:36    Time Spent in minutes  25  minutes   Barton Dubois M.D on 02/21/2016 at 2:10 PM  Between 7am to 7pm - Pager - [phone] After 7pm go to [email or web address] - password [email or web address]  [facility] -  Office  [phone]

## 2016-02-21 NOTE — Progress Notes (Signed)
Pt refuses CPT at this time.  He states that he has had a productive cough and had a lot come up.

## 2016-02-21 NOTE — Progress Notes (Signed)
Inpatient Diabetes Program Recommendations  AACE/ADA: New Consensus Statement on Inpatient Glycemic Control (2015)  Target Ranges:  Prepandial:   less than 140 mg/dL      Peak postprandial:   less than 180 mg/dL (1-2 hours)      Critically ill patients:  140 - 180 mg/dL   Lab Results  Component Value Date   GLUCAP 289 (H) 02/21/2016   HGBA1C 6.0 (H) 02/14/2016    Review of Glycemic Control:  Results for Lance [name], Lance [name] (MRN [id number]) as of 02/21/2016 15:54  Ref. Range 02/20/2016 11:39 02/20/2016 16:24 02/20/2016 21:32 02/21/2016 07:33 02/21/2016 11:28  Glucose-Capillary Latest Ref Range: 65 - 99 mg/dL 218 (H) 122 (H) 263 (H) 149 (H) 289 (H)   Diabetes history: Type 2 diabetes Outpatient Diabetes medications: Novolog 8 units tid with meals, Levemir 18 units daily Current orders for Inpatient glycemic control:  Levemir 10 units daily, Novolog sensitive tid with meals  Inpatient Diabetes Program Recommendations:    Please consider adding Novolog 3 units tid with meals (hold if patient eats less than 50%).  Thanks, Adah Perl, RN, BC-ADM Inpatient Diabetes Coordinator Pager [phone] (8a-5p)

## 2016-02-22 DIAGNOSIS — L89204 Pressure ulcer of unspecified hip, stage 4: Secondary | ICD-10-CM

## 2016-02-22 DIAGNOSIS — F329 Major depressive disorder, single episode, unspecified: Secondary | ICD-10-CM

## 2016-02-22 DIAGNOSIS — J9600 Acute respiratory failure, unspecified whether with hypoxia or hypercapnia: Secondary | ICD-10-CM

## 2016-02-22 LAB — RENAL FUNCTION PANEL
Albumin: 2.4 g/dL — ABNORMAL LOW (ref 3.5–5.0)
Anion gap: 10 (ref 5–15)
BUN: 54 mg/dL — ABNORMAL HIGH (ref 6–20)
CO2: 26 mmol/L (ref 22–32)
Calcium: 8.8 mg/dL — ABNORMAL LOW (ref 8.9–10.3)
Chloride: 101 mmol/L (ref 101–111)
Creatinine, Ser: 3.83 mg/dL — ABNORMAL HIGH (ref 0.61–1.24)
GFR calc Af Amer: 16 mL/min — ABNORMAL LOW
GFR calc non Af Amer: 14 mL/min — ABNORMAL LOW
Glucose, Bld: 151 mg/dL — ABNORMAL HIGH (ref 65–99)
Phosphorus: 3.7 mg/dL (ref 2.5–4.6)
Potassium: 4.1 mmol/L (ref 3.5–5.1)
Sodium: 137 mmol/L (ref 135–145)

## 2016-02-22 LAB — CBC
HEMATOCRIT: 25.5 % — AB (ref 39.0–52.0)
Hemoglobin: 8.2 g/dL — ABNORMAL LOW (ref 13.0–17.0)
MCH: 29.9 pg (ref 26.0–34.0)
MCHC: 32.2 g/dL (ref 30.0–36.0)
MCV: 93.1 fL (ref 78.0–100.0)
Platelets: 250 10*3/uL (ref 150–400)
RBC: 2.74 MIL/uL — ABNORMAL LOW (ref 4.22–5.81)
RDW: 18.3 % — AB (ref 11.5–15.5)
WBC: 10.1 10*3/uL (ref 4.0–10.5)

## 2016-02-22 LAB — HEPATITIS B SURFACE ANTIGEN: Hepatitis B Surface Ag: NEGATIVE

## 2016-02-22 LAB — GLUCOSE, CAPILLARY
GLUCOSE-CAPILLARY: 112 mg/dL — AB (ref 65–99)
Glucose-Capillary: 133 mg/dL — ABNORMAL HIGH (ref 65–99)

## 2016-02-22 LAB — ALT: ALT: 15 U/L — ABNORMAL LOW (ref 17–63)

## 2016-02-22 LAB — CK: Total CK: 25 U/L — ABNORMAL LOW (ref 49–397)

## 2016-02-22 MED ORDER — SODIUM CHLORIDE 0.9 % IV SOLN: 100.0000 mL | INTRAVENOUS | Status: DC | PRN

## 2016-02-22 MED ORDER — LIDOCAINE HCL (PF) 1 % IJ SOLN: 5.0000 mL | INTRAMUSCULAR | Status: DC | PRN

## 2016-02-22 MED ORDER — PENTAFLUOROPROP-TETRAFLUOROETH EX AERO: 1.0000 "application " | INHALATION_SPRAY | CUTANEOUS | Status: DC | PRN

## 2016-02-22 MED ORDER — LIDOCAINE-PRILOCAINE 2.5-2.5 % EX CREA: 1.0000 "application " | TOPICAL_CREAM | CUTANEOUS | Status: DC | PRN

## 2016-02-22 MED ORDER — HEPARIN SODIUM (PORCINE) 1000 UNIT/ML DIALYSIS
2000.0000 [IU] | INTRAMUSCULAR | Status: DC | PRN
Start: 2016-02-23 — End: 2016-02-22

## 2016-02-22 MED ORDER — HEPARIN SODIUM (PORCINE) 1000 UNIT/ML DIALYSIS: 1000.0000 [IU] | INTRAMUSCULAR | Status: DC | PRN

## 2016-02-22 MED ORDER — ALTEPLASE 2 MG IJ SOLR: 2.0000 mg | Freq: Once | INTRAMUSCULAR | Status: DC | PRN

## 2016-02-22 NOTE — Progress Notes (Addendum)
[facility] Rounding Note  Subjective: stable off of supp O2, not making any sig amount of urine.  No sig SOB or other c/o's.  Labs pend  Objective: Vital signs in last 24 hours: Vitals:   02/22/16 0851 02/22/16 0922 02/22/16 0923 02/22/16 0953  BP: (!) 104/53 (!) 79/55 90/71 (!) 95/54  Pulse: 83 83 76 74  Resp: 15 15 20 16   Temp:      TempSrc:      SpO2:      Weight:      Height:       Weight change: 1.2 kg (2 lb 10.3 oz)  Intake/Output Summary (Last 24 hours) at 02/22/16 0958 Last data filed at 02/22/16 4696  Gross per 24 hour  Intake              480 ml  Output              175 ml  Net              305 ml   Physical Exam:  Blood pressure (!) 95/54, pulse 74, temperature 97.8 F (36.6 C), temperature source Oral, resp. rate 16, height 6\' 4"  (1.93 m), weight 75 kg (165 lb 5.5 oz), SpO2 98 %. Gen: Tall thin WM, alert, stronger voice and color much better No JVD Chest - R base clear, L base diffuse rhonchi Cor - RRR no MRG  Abd - soft ntnd no mass or ascites Ext - no edema Neuro - awake and alert, moves UE's well, LE's weak  Background:  75 y.o. year-old with PMH significant for DM2, HTN, multiple sclerosis, spastic quadriplegia, urinary retention, h/o decubitus/ischial ulcers, baseline CKD. Recently hospitalized (9/29-10/4) with R ischial pressure ulcer, that was debrided, treated with 6 days of vanco and zosyn (for this plus a klebsiella and strep UTI) Discharged  10/4 (with a creatinine of 1.82) to finish 4 more days of bactrim. Was brought back to the hospital from [facility] (there for rehab) with new onset dyspnea, and hypoxemia. CXR in the ED showed pulmonary edema, creatinine was noted to be elevated at 3.3 and he was admitted. BNP elevated >900, troponins elevated.   ECHO LV EF 35% - 40% (down from 50-55% 09/2014), moderate diffuse hypokinesis, mild to moderate MR and Grade 1 diastolic dysfunction.  Assessment: 1. AKI on CKD 3 - unclear cause, cardiorenal element likely vs meds  (Bactrim) vs immune-complex from osteo. Ongoing serious infections preclude immunosuppression so would not do kidney biopsy. Manage expectantly and hope for renal recovery. Urine output remains minimal.  Pt at this time would want to do long-term HD if needed- he has sig physical limitations however gets around at baseline in a WC and self-transfers. Cont intermittent HD for now.  2. Pulm edema/ vol overload / acute resp failure - resolved 3. CKD3 - baseline creat 1.8 last admit Oct '17 4. CM EF 35-40% on low dose coreg  5. Anemia - Fe def 6. R ischial osteomyelitis - ID started him on daptomycin 7. IDDM  Plan - reg HD today. If not recovering will need TDC next week.     [name] MD [facility] pgr [phone]   02/18/2016, 8:24 AM  Recent Labs Lab 02/18/16 0535 02/18/16 1619 02/19/16 0445 02/19/16 1600 02/20/16 0420 02/21/16 1119 02/22/16 0743  NA 139 137 138 137 136 134* 137  K 4.6 4.7 4.5 4.4 4.3 4.5 4.1  CL 105 103 103 101 102 100* 101  CO2 23 25  27 25 24 24 26   GLUCOSE 115* 126* 104* 167* 139* 321* 151*  BUN 70* 47* 38* 28* 25* 52* 54*  CREATININE 2.44* 1.99* 1.78* 1.57* 1.60* 3.79* 3.83*  CALCIUM 8.3* 8.4* 8.5* 8.3* 8.6* 8.7* 8.8*  PHOS 4.3 3.6 3.6 2.5 2.4* 3.6 3.7     Recent Labs Lab 02/20/16 0420 02/21/16 1119 02/22/16 0742 02/22/16 0743  ALT  --   --  15*  --   ALBUMIN 2.8* 2.4*  --  2.4*     Recent Labs Lab 02/18/16 0535 02/19/16 0358 02/19/16 0445 02/22/16 0742  WBC 12.1* 9.6 9.5 10.1  NEUTROABS 8.5* 6.0 5.8  --   HGB 9.7* 9.3* 9.2* 8.2*  HCT 29.9* 27.5* 27.6* 25.5*  MCV 93.7 90.2 90.8 93.1  PLT 224 243 237 250     Recent Labs Lab 02/15/16 1428 02/22/16 0741  CKTOTAL 33* 25*   CBG:  Recent Labs Lab 02/20/16 2132 02/21/16 0733 02/21/16 1128 02/21/16 1622 02/21/16 2059  GLUCAP 263* 149* 289* 185* 206*    Iron/TIBC/Ferritin/ %Sat    Component Value Date/Time   IRON 28 (L) 02/15/2016 0532   TIBC 193 (L)  02/15/2016 0532   FERRITIN 316 08/31/2014 0036   IRONPCTSAT 14 (L) 02/15/2016 0532   Studies/Results: No results found. Medications:   . aspirin  81 mg Oral Daily  . carvedilol  6.25 mg Oral BID WC  . collagenase   Topical Daily  . DAPTOmycin (CUBICIN)  IV  600 mg Intravenous Q T,Th,Sa-HD  . feeding supplement (NEPRO CARB STEADY)  237 mL Oral TID BM  . ferumoxytol  510 mg Intravenous Weekly  . heparin subcutaneous  5,000 Units Subcutaneous Q8H  . insulin aspart  0-5 Units Subcutaneous QHS  . insulin aspart  0-9 Units Subcutaneous TID WC  . insulin detemir  10 Units Subcutaneous Daily  . mouth rinse  15 mL Mouth Rinse q12n4p  . multivitamin with minerals  1 tablet Oral Daily  . oxybutynin  5 mg Oral BID

## 2016-02-22 NOTE — Progress Notes (Signed)
Pt off floor at time of scheduled CPT. Will check back

## 2016-02-22 NOTE — Progress Notes (Signed)
PROGRESS NOTE                                                                                                                                                                                                             Patient Demographics:    Lance [name], is a 75 y.o. male, [name] - February 14, 1941, LTJ:[id number]  Admit date - 02/13/2016   Admitting Physician Edwin Dada, MD  Outpatient Primary MD for the patient is [name], MD  LOS - 8  Outpatient Specialists:   Chief Complaint  Patient presents with  . Respiratory Distress       Brief Narrative   75 year old male with history of multiple sclerosis, spastic quadriplegia, chronic Foley for urinary retention, chronic kidney disease stage III, diabetes mellitus, hypertension, history of decubitus ulcers with history of ischial osteomyelitis, who was recently hospitalized due to right ischial pressure ulcer requiring debridement and treated with vancomycin and Zosyn. He was then discharged on oral Bactrim (both for the ulcer and Klebsiella/strep UTI). He was discharged to SNF but returned with new onset dyspnea and hypoxemia. Chest x-ray showed pulmonary edema with worsened renal function. He had elevated BNP >900 and mildly elevated troponins. Patient found to have acute cardiomyopathy with acute respiratory failure requiring BiPAP and transferred to ICU. He also developed worsened renal function concerning for cardiorenal syndrome now requiring CRRT.    Subjective:   tolerating CRRT. Respiratory status much better today.    Assessment  & Plan :    Principal Problem:   Acute hypoxic respiratory failure ([place]) Secondary to acute cardiomyopathy with pulmonary edema. now requiring CRRT. Wean oxygen to room air. Pulmonary edema with transudative fluids on CXR, improving with CRRT. Pulmonary consult appreciated. Recommend volume management with  CRRT.  Active Problems: Acute systolic CHF/nonischemic cardiomyopathy -EF of 35-40% as per echo. Elevated troponin likely due to demand ischemia. No wall motion abnormalities. -On Coreg. Lasix discontinued due to worsening renal function. Not on ACEi/ ARB due to renal failure. -continue statins and ASA -Plan for intermittent HD for volume control in expectancy for kidneys to recover function. -Had HD today; will follow renal function in am and follow nephrology rec's for next treatment .  Acute on chronic kidney disease (stage 3-4 at baseline) -Appears baseline creatinine of 1.8. Suspect cardiorenal etiology. ASO titre positive, ? Immune complex glomerulonephritis. -  Patient started on CRRT, via rt femoral catheter. Tolerated well and volume status improved.  -[facility]; expectations is for kidney recovery. -Cr 3.83 10/21 -patient received HD today; will follow renal function in am   Decubitus stage IV ulcer with Right ischial osteomyelitis -As suggested on MRI. Patient being followed by plastic surgeon at [facility] for possible muscle flap surgery at some point. -Prior culture from ischial wounds grew MRSA and enterococcus. Started on 6 weeks course of IV daptomycin by ID recommendations. -no fever and non-toxic currently   Severe protein calorie malnutrition -Added supplement. -nutritonal service consulted  DM (diabetes mellitus) type II controlled with renal manifestation ([facility]) -Currently on reduced dose Levemir 10 units at bedtime with sliding scale coverage. -Last A1c of 6.1. -will follow CBG's and adjust hypoglycemic regimen as needed   Multiple sclerosis ([place]) -With spastic paraplegia diagnosed in mid 51s. -limited mobility and essentially wheelchair  Bound   Normocytic anemia -At baseline. -IV iron and aranesp as per renal discretion -no signs of acute GI bleed  Chronic Foley catheter  -history of recurrent UTI.  -On suppressive  trimethoprim at home.  -Continue oxybutynin.  Code Status : Full code  Family Communication  : friend at bedside   Disposition Plan  :remains inpatient; will follow rec's from renal service   Barriers For Discharge :receiving HD intermittently; kidneys still not recovered and volume status still been adjusted.  Consults  :   Renal Cardiology Pulmonary ID  Procedures  :  2-D echo CRRT Now receiving intermittent HD MRI sacrum  DVT Prophylaxis  :  SCDs  Lab Results  Component Value Date   PLT 250 02/22/2016    Antibiotics  :   Anti-infectives    Start     Dose/Rate Route Frequency Ordered Stop   02/22/16 2200  DAPTOmycin (CUBICIN) 600 mg in sodium chloride 0.9 % IVPB     600 mg 224 mL/hr over 30 Minutes Intravenous Every T-Th-Sa (Hemodialysis) 02/21/16 1324     02/21/16 2200  DAPTOmycin (CUBICIN) 600 mg in sodium chloride 0.9 % IVPB  Status:  Discontinued     600 mg 224 mL/hr over 30 Minutes Intravenous Every M-W-F (Hemodialysis) 02/20/16 1001 02/21/16 1324   02/19/16 0600  DAPTOmycin (CUBICIN) 500 mg in sodium chloride 0.9 % IVPB  Status:  Discontinued     500 mg 220 mL/hr over 30 Minutes Intravenous Every 24 hours 02/18/16 1413 02/20/16 1001   02/15/16 1500  DAPTOmycin (CUBICIN) 676 mg in sodium chloride 0.9 % IVPB  Status:  Discontinued     8 mg/kg  84.5 kg 227 mL/hr over 30 Minutes Intravenous Every 48 hours 02/15/16 1407 02/18/16 1413   02/15/16 1400  DAPTOmycin (CUBICIN) 676 mg in sodium chloride 0.9 % IVPB  Status:  Discontinued     8 mg/kg  84.5 kg 227 mL/hr over 30 Minutes Intravenous Every 24 hours 02/15/16 1353 02/15/16 1406   02/14/16 0215  vancomycin (VANCOCIN) IVPB 1000 mg/200 mL premix  Status:  Discontinued     1,000 mg 200 mL/hr over 60 Minutes Intravenous  Once 02/14/16 0212 02/14/16 0243   02/14/16 0145  cefTRIAXone (ROCEPHIN) 1 g in dextrose 5 % 50 mL IVPB     1 g 100 mL/hr over 30 Minutes Intravenous  Once 02/14/16 0144 02/14/16 0232          Objective:   Vitals:   02/22/16 1054 02/22/16 1130 02/22/16 1215 02/22/16 1756  BP: (!) 102/58 Marland Kitchen)  114/48 (!) 101/56 (!) 116/46  Pulse: 78 89 82 70  Resp: (!) 22 18 16 17   Temp:  97.4 F (36.3 C) 97.6 F (36.4 C) 98.2 F (36.8 C)  TempSrc:  Oral Oral Oral  SpO2:   99% 97%  Weight:  75 kg (165 lb 5.5 oz)    Height:        Wt Readings from Last 3 Encounters:  02/22/16 75 kg (165 lb 5.5 oz)  02/13/16 85 kg (187 lb 6.4 oz)  02/06/16 79.9 kg (176 lb 3.2 oz)     Intake/Output Summary (Last 24 hours) at 02/22/16 1913 Last data filed at 02/22/16 1757  Gross per 24 hour  Intake              720 ml  Output              275 ml  Net              445 ml     Physical Exam Gen: Elderly male appears frail and chronically ill; afebrile and no complaining of CP or SOB. Had HD today and tolerated procedure well HEENT: moist mucosa, supple neck, no JVD appreciated  Chest: improved lung breath sounds b/l, no frank crackles or wheezing  CVS: N S1&S2, no murmurs, rubs or gallop GI: soft, NT, ND, BS+, chronic Foley Musculoskeletal: warm, sacral decubitus ulcer appreciated, positive serosanguineous drainage  CNS: Alert and oriented, paraplegic, able to follow commands.    Data Review:    CBC  Recent Labs Lab 02/17/16 0352 02/18/16 0535 02/19/16 0358 02/19/16 0445 02/22/16 0742  WBC 12.6* 12.1* 9.6 9.5 10.1  HGB 9.8* 9.7* 9.3* 9.2* 8.2*  HCT 29.4* 29.9* 27.5* 27.6* 25.5*  PLT 202 224 243 237 250  MCV 89.9 93.7 90.2 90.8 93.1  MCH 30.0 30.4 30.5 30.3 29.9  MCHC 33.3 32.4 33.8 33.3 32.2  RDW 17.3* 17.8* 17.5* 17.6* 18.3*  LYMPHSABS  --  2.4 2.5 2.5  --   MONOABS  --  1.2* 1.1* 1.2*  --   EOSABS  --  0.0 0.0 0.0  --   BASOSABS  --  0.0 0.0 0.0  --     Chemistries   Recent Labs Lab 02/18/16 0535  02/19/16 0445 02/19/16 1600 02/20/16 0420 02/21/16 1119 02/22/16 0742 02/22/16 0743  NA 139  < > 138 137 136 134*  --  137  K 4.6  < > 4.5 4.4 4.3 4.5  --  4.1  CL 105   < > 103 101 102 100*  --  101  CO2 23  < > 27 25 24 24   --  26  GLUCOSE 115*  < > 104* 167* 139* 321*  --  151*  BUN 70*  < > 38* 28* 25* 52*  --  54*  CREATININE 2.44*  < > 1.78* 1.57* 1.60* 3.79*  --  3.83*  CALCIUM 8.3*  < > 8.5* 8.3* 8.6* 8.7*  --  8.8*  MG 2.7*  --  2.7*  --  2.6*  --   --   --   ALT  --   --   --   --   --   --  15*  --   < > = values in this interval not displayed. ------------------------------------------------------------------------------------------------------------------  Lab Results  Component Value Date   HGBA1C 6.0 (H) 02/14/2016   ------------------------------------------------------------------------------------------------------------------ No results for input(s): TSH, T4TOTAL, T3FREE, THYROIDAB in the last 72 hours.  Invalid input(s):  FREET3 ------------------------------------------------------------------------------------------------------------------  Cardiac Enzymes No results for input(s): CKMB, TROPONINI, MYOGLOBIN in the last 168 hours.  Invalid input(s): CK ------------------------------------------------------------------------------------------------------------------    Component Value Date/Time   BNP 927.0 (H) 02/14/2016 0020    Inpatient Medications  Scheduled Meds: . aspirin  81 mg Oral Daily  . carvedilol  6.25 mg Oral BID WC  . collagenase   Topical Daily  . DAPTOmycin (CUBICIN)  IV  600 mg Intravenous Q T,Th,Sa-HD  . feeding supplement (NEPRO CARB STEADY)  237 mL Oral TID BM  . heparin subcutaneous  5,000 Units Subcutaneous Q8H  . insulin aspart  0-5 Units Subcutaneous QHS  . insulin aspart  0-9 Units Subcutaneous TID WC  . insulin detemir  10 Units Subcutaneous Daily  . mouth rinse  15 mL Mouth Rinse q12n4p  . multivitamin with minerals  1 tablet Oral Daily  . oxybutynin  5 mg Oral BID   Continuous Infusions:   PRN Meds:.acetaminophen **OR** acetaminophen, ALPRAZolam, bisacodyl, HYDROcodone-acetaminophen,  ondansetron **OR** ondansetron (ZOFRAN) IV, polyethylene glycol, sodium chloride flush  Micro Results Recent Results (from the past 240 hour(s))  Culture, blood (Routine X 2) w Reflex to ID Panel     Status: None   Collection Time: 02/15/16  1:00 PM  Result Value Ref Range Status   Specimen Description BLOOD RIGHT ARM  2 ML IN YELLOW  Final   Special Requests NONE  Final   Culture   Final    NO GROWTH 5 DAYS Performed at [facility]    Report Status 02/20/2016 FINAL  Final  Culture, blood (Routine X 2) w Reflex to ID Panel     Status: None   Collection Time: 02/15/16  1:00 PM  Result Value Ref Range Status   Specimen Description BLOOD LEFT ARM  3 ML IN YELLOW  Final   Special Requests NONE  Final   Culture   Final    NO GROWTH 5 DAYS Performed at [facility]    Report Status 02/20/2016 FINAL  Final    Radiology Reports Dg Chest 2 View  Result Date: 02/14/2016 CLINICAL DATA:  Acute onset of wheezing and shortness of breath. Initial encounter. EXAM: CHEST  2 VIEW COMPARISON:  Chest radiograph performed 08/30/2014 FINDINGS: Vascular congestion is noted, with bilateral central airspace opacification and small bilateral pleural effusions, compatible with pulmonary edema. No pneumothorax is seen. The cardiomediastinal silhouette is borderline normal in size. No acute osseous abnormalities are identified. IMPRESSION: Vascular congestion, with bilateral central airspace opacification and small bilateral pleural effusions, compatible with pulmonary edema. Electronically Signed   By: Garald Balding M.D.   On: 02/14/2016 01:35   US Renal  Result Date: 02/14/2016 CLINICAL DATA:  Acute renal failure. EXAM: RENAL / URINARY TRACT ULTRASOUND COMPLETE COMPARISON:  Renal ultrasound of March 30, 2011. FINDINGS: Right Kidney: Length: 11.2 cm. Two simple cysts are seen in the upper pole, with the largest measuring 2.3 cm. Increased echogenicity of renal parenchyma is noted consistent  with medical renal disease. No mass or hydronephrosis visualized. Left Kidney: Length: 10.7 cm. Increased echogenicity of renal parenchyma is noted consistent with medical renal disease. No mass or hydronephrosis visualized. Bladder: Decompressed secondary to Foley catheter. IMPRESSION: Simple right renal cyst. Increased echogenicity of renal parenchyma bilaterally is noted consistent with medical renal disease. Electronically Signed   By: Marijo Conception, M.D.   On: 02/14/2016 08:36   Mr Sacrum/si Joints Wo Contrast  Result Date: 02/14/2016 CLINICAL DATA:  Right ischial pressure ulcer  since September of this year. Wound culture from 01/30/2016 grew MRSA. EXAM: MR SACRUM WITHOUT CONTRAST TECHNIQUE: Multiplanar, multisequence MR imaging was performed. No intravenous contrast was administered. COMPARISON:  MRI of the pelvis 09/01/2014. FINDINGS: There is partial visualization of marrow edema in the left parasymphyseal pubic bone, unchanged. No fracture is identified. A large ulceration over the right ischial tuberosity extends to bone. The wound appears to be packed. Mildly increased T2 signal and thickening are seen about the margins of the wound which could be due to infection or granulation tissue. A small focus of marrow edema is seen subjacent to the ulcer in the ischium compatible with osteomyelitis. No soft tissue abscess is identified. An ulceration is also seen over the left ischial tuberosity which may be healed. Marked edema is seen in subcutaneous soft tissues over the right greater trochanter. Small focus of mild marrow edema is seen in the underlying greater trochanter. Bone marrow signal is normal in the sacrum. There is no SI joint effusion. Small amount of fluid is present in the hip joints bilaterally most compatible with physiologic change. No muscle or tendon tear is seen. Imaged intrapelvic contents demonstrated a Foley catheter in place. The urinary bladder is decompressed. Its walls appear  markedly thickened. IMPRESSION: Large ulceration over the right ischial tuberosity extends to bone. Mild marrow edema is seen in the subjacent ischial tuberosity compatible with osteomyelitis. Subcutaneous edema over the right greater trochanter may be due to cellulitis. Small focus of mild marrow edema in the greater trochanter is worrisome for osteomyelitis. The urinary bladder is nearly completely decompressed but its walls appear markedly thickened which may be due to chronic bladder outlet obstruction and/or cystitis. Electronically Signed   By: Inge Rise M.D.   On: 02/14/2016 16:03   Dg Chest Port 1 View  Result Date: 02/19/2016 CLINICAL DATA:  Pulmonary edema. EXAM: PORTABLE CHEST 1 VIEW COMPARISON:  02/18/2016. FINDINGS: Heart size normal. Persistent but improving bilateral pulmonary infiltrates/edema. Low lung volumes. Persistent pleural effusions, left side greater than right . No pneumothorax. Degenerative changes thoracic spine . IMPRESSION: Persistent but improving bilateral pulmonary infiltrates/edema. Low lung volumes. Persistent pleural effusions, left side greater than right. Electronically Signed   By: Marcello Moores  Register   On: 02/19/2016 07:26   Dg Chest Port 1 View  Result Date: 02/18/2016 CLINICAL DATA:  Acute respiratory distress.  Aspiration. EXAM: PORTABLE CHEST 1 VIEW COMPARISON:  02/18/2016 at 0754 hours FINDINGS: The cardiomediastinal silhouette is unchanged. Pulmonary vascular congestion and diffuse interstitial prominence, slightly increased from prior. Left mid lung and left greater than right basilar airspace opacities have mildly progressed. Small left pleural effusion may have mildly enlarged. No pneumothorax identified. IMPRESSION: Slight interval worsening of left greater than right lung opacities suggestive of edema, although superimposed infection or aspiration is not excluded. Slightly increased size of small left pleural effusion. Electronically Signed   By: Logan Bores M.D.   On: 02/18/2016 11:15   Dg Chest Port 1 View  Result Date: 02/18/2016 CLINICAL DATA:  Acute respiratory failure.  CHF EXAM: PORTABLE CHEST 1 VIEW COMPARISON:  02/17/2016 FINDINGS: Improvement in bilateral airspace disease compatible with pulmonary edema. There remains mild-to-moderate bilateral edema. Small bilateral effusions and bibasilar atelectasis have improved. IMPRESSION: Improving congestive heart failure with edema and bilateral effusions. Bibasilar consolidation has improved. Electronically Signed   By: Franchot Gallo M.D.   On: 02/18/2016 09:23   Dg Chest Port 1 View  Result Date: 02/17/2016 CLINICAL DATA:  Acute congestive heart failure.  Acute renal failure. Worsening shortness of breath and hypoxia. EXAM: PORTABLE CHEST 1 VIEW COMPARISON:  02/14/2016 FINDINGS: Worsening diffuse symmetric bilateral airspace disease is seen, most likely due to worsening pulmonary edema. Bilateral pleural effusions are again noted. No pneumothorax visualized. Heart size is stable. IMPRESSION: Worsening diffuse symmetric airspace disease, most likely due ago pulmonary edema. Bilateral pleural effusions again noted. Electronically Signed   By: Earle Gell M.D.   On: 02/17/2016 14:15   Dg Hip Unilat With Pelvis 2-3 Views Right  Result Date: 01/30/2016 CLINICAL DATA:  75 year old with approximate 6 week history of a decubitus ulcer involving the right buttock, with recent worsening of the infection. EXAM: DG HIP (WITH OR WITHOUT PELVIS) 2-3V RIGHT COMPARISON:  Right hip x-rays 05/28/2015.  MRI pelvis 09/01/2014. FINDINGS: No evidence of acute fracture or dislocation. No evidence of osteomyelitis. No visible joint effusion. Moderate joint space narrowing. Bone mineral density well-preserved. Included AP pelvis again shows the healed osteomyelitis with new bone formation at the left ischium. Contralateral left hip joint with symmetric moderate joint space narrowing. Sacroiliac joints and symphysis  pubis intact. Mild degenerative changes involving the visualized lower lumbar spine. Penile prosthesis again noted. IMPRESSION: No acute osseous abnormality. No radiographic evidence of acute osteomyelitis. Electronically Signed   By: Evangeline Dakin M.D.   On: 01/30/2016 20:36    Time Spent in minutes  25 minutes   Barton Dubois M.D on 02/22/2016 at 7:13 PM  Between 7am to 7pm - Pager - [phone] After 7pm go to [email or web address] - password [facility]  [facility] -  Office  [phone]

## 2016-02-22 NOTE — Progress Notes (Signed)
RT by to do scheduled CPT. Pt has lots of family in the room. RT will come back later for CPT.

## 2016-02-22 NOTE — Progress Notes (Signed)
Pt continues to refuse chest vest. Machine removed from room at this time.

## 2016-02-23 LAB — RENAL FUNCTION PANEL
ANION GAP: 11 (ref 5–15)
Albumin: 2.2 g/dL — ABNORMAL LOW (ref 3.5–5.0)
BUN: 35 mg/dL — AB (ref 6–20)
CHLORIDE: 97 mmol/L — AB (ref 101–111)
CO2: 25 mmol/L (ref 22–32)
Calcium: 8.2 mg/dL — ABNORMAL LOW (ref 8.9–10.3)
Creatinine, Ser: 3.51 mg/dL — ABNORMAL HIGH (ref 0.61–1.24)
GFR, EST AFRICAN AMERICAN: 18 mL/min — AB (ref >60)
GFR, EST NON AFRICAN AMERICAN: 16 mL/min — AB (ref >60)
Glucose, Bld: 104 mg/dL — ABNORMAL HIGH (ref 65–99)
PHOSPHORUS: 4.8 mg/dL — AB (ref 2.5–4.6)
POTASSIUM: 3.9 mmol/L (ref 3.5–5.1)
Sodium: 133 mmol/L — ABNORMAL LOW (ref 135–145)

## 2016-02-23 LAB — GLUCOSE, CAPILLARY
GLUCOSE-CAPILLARY: 104 mg/dL — AB (ref 65–99)
GLUCOSE-CAPILLARY: 204 mg/dL — AB (ref 65–99)
GLUCOSE-CAPILLARY: 116 mg/dL — AB (ref 65–99)
GLUCOSE-CAPILLARY: 178 mg/dL — AB (ref 65–99)

## 2016-02-23 LAB — HEPATITIS B SURFACE ANTIBODY,QUALITATIVE: Hep B S Ab: NONREACTIVE

## 2016-02-23 LAB — HEPATITIS B CORE ANTIBODY, TOTAL: HEP B C TOTAL AB: NEGATIVE

## 2016-02-23 MED ORDER — SODIUM CHLORIDE 0.9 % IV SOLN
600.0000 mg | INTRAVENOUS | Status: DC
  Administered 2016-02-24: 600 mg via INTRAVENOUS
  Filled 2016-02-23 (×2): qty 12

## 2016-02-23 NOTE — Progress Notes (Signed)
[facility] Rounding Note  Subjective: feels best today since having been admitted.  occ cough.  No new c/o's.   Objective: Vital signs in last 24 hours: Vitals:   02/22/16 1756 02/22/16 2138 02/23/16 0646 02/23/16 0949  BP: (!) 116/46 (!) 118/56 (!) 110/56 (!) 130/51  Pulse: 70 84 78 86  Resp: 17 17 16 17   Temp: 98.2 F (36.8 C) 98.4 F (36.9 C) 98.7 F (37.1 C) 97.7 F (36.5 C)  TempSrc: Oral Oral Oral Oral  SpO2: 97% 98% 97% 98%  Weight:      Height:       Weight change: 0.3 kg (10.6 oz)  Intake/Output Summary (Last 24 hours) at 02/23/16 1445 Last data filed at 02/23/16 1157  Gross per 24 hour  Intake              600 ml  Output              175 ml  Net              425 ml   Physical Exam:  Blood pressure (!) 130/51, pulse 86, temperature 97.7 F (36.5 C), temperature source Oral, resp. rate 17, height 6\' 4"  (1.93 m), weight 75 kg (165 lb 5.5 oz), SpO2 98 %. Gen: Tall very thin WM, alert, quiet voice No JVD Chest - clear bilat Cor - RRR no MRG  Abd - soft ntnd no mass or ascites Ext - no edema, + musc wasting Neuro - NF, ox 3, paraplegic  Background:  75 y.o. year-old with PMH significant for DM2, HTN, multiple sclerosis, spastic paraplegia, urinary retention, h/o decubitus/ischial ulcers, baseline CKD. Recently hospitalized (9/29-10/4) with R ischial pressure ulcer, that was debrided, treated with 6 days of vanco and zosyn (for this plus a klebsiella and strep UTI) Discharged  10/4 (with a creatinine of 1.82) to finish 4 more days of bactrim. Was brought back to the hospital from [facility] (there for rehab) with new onset dyspnea, and hypoxemia. CXR in the ED showed pulmonary edema, creatinine was noted to be elevated at 3.3 and he was admitted. BNP elevated >900, troponins elevated.   ECHO LV EF 35% - 40% (down from 50-55% 09/2014), moderate diffuse hypokinesis, mild to moderate MR and Grade 1 diastolic dysfunction.  Assessment: 1. AKI on CKD 3 - unclear cause, cardiorenal  element likely vs meds (Bactrim) vs immune-complex from osteo. Ongoing serious infections preclude immunosuppression so will not pursue kidney biopsy. Manage expectantly and hope for renal recovery. Urine output remains low.  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  2. CKD 3- baseline creat 1.8 last admit Oct '17 3. Acute resp failure/ pulm edema - resolved w CRRT/ HD, euvolemic now 4. CM EF 35-40% on low dose coreg  5. Anemia - Fe def 6. R ischial osteomyelitis - ID started him on daptomycin 7. IDDM 8. HD access - has temp femoral HD cath, consider TDC soon as needed  [place] pgr [phone]   02/18/2016, 8:24 AM  Recent Labs Lab 02/18/16 1619 02/19/16 0445 02/19/16 1600 02/20/16 0420 02/21/16 1119 02/22/16 0743 02/23/16 0630  NA 137 138 137 136 134* 137 133*  K 4.7 4.5 4.4 4.3 4.5 4.1 3.9  CL 103 103 101 102 100* 101 97*  CO2 25 27 25 24 24 26  25  GLUCOSE 126* 104* 167* 139* 321* 151* 104*  BUN 47* 38* 28* 25* 52* 54* 35*  CREATININE 1.99* 1.78* 1.57* 1.60* 3.79* 3.83* 3.51*  CALCIUM 8.4* 8.5* 8.3* 8.6* 8.7* 8.8* 8.2*  PHOS 3.6 3.6 2.5 2.4* 3.6 3.7 4.8*     Recent Labs Lab 02/21/16 1119 02/22/16 0742 02/22/16 0743 02/23/16 0630  ALT  --  15*  --   --   ALBUMIN 2.4*  --  2.4* 2.2*     Recent Labs Lab 02/18/16 0535 02/19/16 0358 02/19/16 0445 02/22/16 0742  WBC 12.1* 9.6 9.5 10.1  NEUTROABS 8.5* 6.0 5.8  --   HGB 9.7* 9.3* 9.2* 8.2*  HCT 29.9* 27.5* 27.6* 25.5*  MCV 93.7 90.2 90.8 93.1  PLT 224 243 237 250     Recent Labs Lab 02/22/16 0741  CKTOTAL 25*   CBG:  Recent Labs Lab 02/21/16 2059 02/22/16 1219 02/22/16 1711 02/23/16 0750 02/23/16 1157  GLUCAP 206* 133* 112* 104* 204*    Iron/TIBC/Ferritin/ %Sat    Component Value Date/Time   IRON 28 (L) 02/15/2016 0532   TIBC 193 (L)  02/15/2016 0532   FERRITIN 316 08/31/2014 0036   IRONPCTSAT 14 (L) 02/15/2016 0532   Studies/Results: No results found. Medications:   . aspirin  81 mg Oral Daily  . carvedilol  6.25 mg Oral BID WC  . collagenase   Topical Daily  . DAPTOmycin (CUBICIN)  IV  600 mg Intravenous Q T,Th,Sa-HD  . feeding supplement (NEPRO CARB STEADY)  237 mL Oral TID BM  . heparin subcutaneous  5,000 Units Subcutaneous Q8H  . insulin aspart  0-5 Units Subcutaneous QHS  . insulin aspart  0-9 Units Subcutaneous TID WC  . insulin detemir  10 Units Subcutaneous Daily  . mouth rinse  15 mL Mouth Rinse q12n4p  . multivitamin with minerals  1 tablet Oral Daily  . oxybutynin  5 mg Oral BID

## 2016-02-23 NOTE — Progress Notes (Signed)
PROGRESS NOTE                                                                                                                                                                                                             Patient Demographics:    Lance [name], is a 75 y.o. male, [name] - July 14, 1940, JJO:[id number]  Admit date - 02/13/2016   Admitting Physician Edwin Dada, MD  Outpatient Primary MD for the patient is [name], MD  LOS - 9  Outpatient Specialists:   Chief Complaint  Patient presents with  . Respiratory Distress       Brief Narrative   75 year old male with history of multiple sclerosis, spastic quadriplegia, chronic Foley for urinary retention, chronic kidney disease stage III, diabetes mellitus, hypertension, history of decubitus ulcers with history of ischial osteomyelitis, who was recently hospitalized due to right ischial pressure ulcer requiring debridement and treated with vancomycin and Zosyn. He was then discharged on oral Bactrim (both for the ulcer and Klebsiella/strep UTI). He was discharged to SNF but returned with new onset dyspnea and hypoxemia. Chest x-ray showed pulmonary edema with worsened renal function. He had elevated BNP >900 and mildly elevated troponins. Patient found to have acute cardiomyopathy with acute respiratory failure requiring BiPAP and transferred to ICU. He also developed worsened renal function concerning for cardiorenal syndrome now requiring CRRT.    Subjective:   Afebrile, in no distress. denies CP and SOB. Has tolerated HD session w/o problems.     Assessment  & Plan :    Principal Problem:   Acute hypoxic respiratory failure ([place]) Secondary to acute cardiomyopathy with pulmonary edema. now requiring CRRT. Wean oxygen to room air. Pulmonary edema with transudative fluids on CXR, improving with CRRT. Pulmonary consult appreciated. Recommend volume  management with CRRT.  Active Problems: Acute systolic CHF/nonischemic cardiomyopathy -EF of 35-40% as per echo. Elevated troponin likely due to demand ischemia. No wall motion abnormalities. -On Coreg. Lasix discontinued due to worsening renal function. Not on ACEi/ ARB due to renal failure. -continue statins and ASA -Plan for intermittent HD for volume control in expectancy for kidneys to recover function. -Had HD yesterday; will follow renal function in am and follow nephrology rec's for next treatment .  Acute on chronic kidney disease (stage 3-4 at baseline) -Appears baseline creatinine of 1.8. Suspect cardiorenal  etiology. ASO titre positive, ? Immune complex glomerulonephritis. -Patient started on CRRT, via rt femoral catheter. Tolerated well and volume status improved.  -[facility]; expectations is for kidney recovery. -Cr 3.51 10/2 after HD -will follow renal function again in am  -nephrology to decide next treatment  Decubitus stage IV ulcer with Right ischial osteomyelitis -As suggested on MRI. Patient being followed by plastic surgeon at [facility] for possible muscle flap surgery at some point. -Prior culture from ischial wounds grew MRSA and enterococcus. Started on 6 weeks course of IV daptomycin by ID recommendations. -no fever and non-toxic appearance currently  -last antibiotic dose anticipated Mar 30, 2016  Severe protein calorie malnutrition -continue feeding supplement. -nutritonal service consulted  DM (diabetes mellitus) type II controlled with renal manifestation (H[facility]) -Currently on reduced dose Levemir 10 units at bedtime with sliding scale coverage. -Last A1c of 6.1. -will follow CBG's and adjust hypoglycemic regimen as needed   Multiple sclerosis ([place]) -With spastic paraplegia diagnosed in mid 82s. -limited mobility and essentially wheelchair  Bound   Normocytic anemia -At baseline. -IV iron and aranesp as per renal  discretion -no signs of acute GI bleed  Chronic Foley catheter  -history of recurrent UTI.  -On suppressive trimethoprim at home.  -Continue oxybutynin. -foley in place  Code Status : Full code  Family Communication  : friend at bedside   Disposition Plan  :remains inpatient; will follow rec's from renal service   Barriers For Discharge :receiving HD intermittently; kidneys still not recovered and volume status still been adjusted.  Consults  :   Renal Cardiology Pulmonary ID  Procedures  :  2-D echo CRRT Now receiving intermittent HD MRI sacrum  DVT Prophylaxis  :  SCDs  Lab Results  Component Value Date   PLT 250 02/22/2016    Antibiotics  :   Anti-infectives    Start     Dose/Rate Route Frequency Ordered Stop   02/22/16 2200  DAPTOmycin (CUBICIN) 600 mg in sodium chloride 0.9 % IVPB     600 mg 224 mL/hr over 30 Minutes Intravenous Every T-Th-Sa (Hemodialysis) 02/21/16 1324     02/21/16 2200  DAPTOmycin (CUBICIN) 600 mg in sodium chloride 0.9 % IVPB  Status:  Discontinued     600 mg 224 mL/hr over 30 Minutes Intravenous Every M-W-F (Hemodialysis) 02/20/16 1001 02/21/16 1324   02/19/16 0600  DAPTOmycin (CUBICIN) 500 mg in sodium chloride 0.9 % IVPB  Status:  Discontinued     500 mg 220 mL/hr over 30 Minutes Intravenous Every 24 hours 02/18/16 1413 02/20/16 1001   02/15/16 1500  DAPTOmycin (CUBICIN) 676 mg in sodium chloride 0.9 % IVPB  Status:  Discontinued     8 mg/kg  84.5 kg 227 mL/hr over 30 Minutes Intravenous Every 48 hours 02/15/16 1407 02/18/16 1413   02/15/16 1400  DAPTOmycin (CUBICIN) 676 mg in sodium chloride 0.9 % IVPB  Status:  Discontinued     8 mg/kg  84.5 kg 227 mL/hr over 30 Minutes Intravenous Every 24 hours 02/15/16 1353 02/15/16 1406   02/14/16 0215  vancomycin (VANCOCIN) IVPB 1000 mg/200 mL premix  Status:  Discontinued     1,000 mg 200 mL/hr over 60 Minutes Intravenous  Once 02/14/16 0212 02/14/16 0243   02/14/16 0145  cefTRIAXone  (ROCEPHIN) 1 g in dextrose 5 % 50 mL IVPB     1 g 100 mL/hr over 30 Minutes Intravenous  Once 02/14/16 0144 02/14/16 0232  Objective:   Vitals:   02/22/16 1756 02/22/16 2138 02/23/16 0646 02/23/16 0949  BP: (!) 116/46 (!) 118/56 (!) 110/56 (!) 130/51  Pulse: 70 84 78 86  Resp: 17 17 16 17   Temp: 98.2 F (36.8 C) 98.4 F (36.9 C) 98.7 F (37.1 C) 97.7 F (36.5 C)  TempSrc: Oral Oral Oral Oral  SpO2: 97% 98% 97% 98%  Weight:      Height:        Wt Readings from Last 3 Encounters:  02/22/16 75 kg (165 lb 5.5 oz)  02/13/16 85 kg (187 lb 6.4 oz)  02/06/16 79.9 kg (176 lb 3.2 oz)     Intake/Output Summary (Last 24 hours) at 02/23/16 1255 Last data filed at 02/23/16 1157  Gross per 24 hour  Intake             1080 ml  Output              175 ml  Net              905 ml     Physical Exam Gen: Elderly male appears frail and chronically ill; afebrile and no complaining of CP or SOB. Had HD yesterday (1021) and tolerated procedure well HEENT: moist mucosa, supple neck, no JVD appreciated  Chest: improved lung breath sounds b/l, no frank crackles or wheezing  CVS: N S1&S2, no murmurs, rubs or gallop GI: soft, NT, ND, BS+, chronic Foley Musculoskeletal: warm, sacral decubitus ulcer appreciated, positive serosanguineous drainage  CNS: Alert and oriented, paraplegic, able to follow commands.    Data Review:    CBC  Recent Labs Lab 02/17/16 0352 02/18/16 0535 02/19/16 0358 02/19/16 0445 02/22/16 0742  WBC 12.6* 12.1* 9.6 9.5 10.1  HGB 9.8* 9.7* 9.3* 9.2* 8.2*  HCT 29.4* 29.9* 27.5* 27.6* 25.5*  PLT 202 224 243 237 250  MCV 89.9 93.7 90.2 90.8 93.1  MCH 30.0 30.4 30.5 30.3 29.9  MCHC 33.3 32.4 33.8 33.3 32.2  RDW 17.3* 17.8* 17.5* 17.6* 18.3*  LYMPHSABS  --  2.4 2.5 2.5  --   MONOABS  --  1.2* 1.1* 1.2*  --   EOSABS  --  0.0 0.0 0.0  --   BASOSABS  --  0.0 0.0 0.0  --     Chemistries   Recent Labs Lab 02/18/16 0535  02/19/16 0445 02/19/16 1600  02/20/16 0420 02/21/16 1119 02/22/16 0742 02/22/16 0743 02/23/16 0630  NA 139  < > 138 137 136 134*  --  137 133*  K 4.6  < > 4.5 4.4 4.3 4.5  --  4.1 3.9  CL 105  < > 103 101 102 100*  --  101 97*  CO2 23  < > 27 25 24 24   --  26 25  GLUCOSE 115*  < > 104* 167* 139* 321*  --  151* 104*  BUN 70*  < > 38* 28* 25* 52*  --  54* 35*  CREATININE 2.44*  < > 1.78* 1.57* 1.60* 3.79*  --  3.83* 3.51*  CALCIUM 8.3*  < > 8.5* 8.3* 8.6* 8.7*  --  8.8* 8.2*  MG 2.7*  --  2.7*  --  2.6*  --   --   --   --   ALT  --   --   --   --   --   --  15*  --   --   < > = values in this interval not displayed. ------------------------------------------------------------------------------------------------------------------  Lab Results  Component Value Date   HGBA1C 6.0 (H) 02/14/2016   ------------------------------------------------------------------------------------------------------------------ No results for input(s): TSH, T4TOTAL, T3FREE, THYROIDAB in the last 72 hours.  Invalid input(s): FREET3 ------------------------------------------------------------------------------------------------------------------  Cardiac Enzymes No results for input(s): CKMB, TROPONINI, MYOGLOBIN in the last 168 hours.  Invalid input(s): CK ------------------------------------------------------------------------------------------------------------------    Component Value Date/Time   BNP 927.0 (H) 02/14/2016 0020    Inpatient Medications  Scheduled Meds: . aspirin  81 mg Oral Daily  . carvedilol  6.25 mg Oral BID WC  . collagenase   Topical Daily  . DAPTOmycin (CUBICIN)  IV  600 mg Intravenous Q T,Th,Sa-HD  . feeding supplement (NEPRO CARB STEADY)  237 mL Oral TID BM  . heparin subcutaneous  5,000 Units Subcutaneous Q8H  . insulin aspart  0-5 Units Subcutaneous QHS  . insulin aspart  0-9 Units Subcutaneous TID WC  . insulin detemir  10 Units Subcutaneous Daily  . mouth rinse  15 mL Mouth Rinse q12n4p  .  multivitamin with minerals  1 tablet Oral Daily  . oxybutynin  5 mg Oral BID   Continuous Infusions:   PRN Meds:.acetaminophen **OR** acetaminophen, ALPRAZolam, bisacodyl, HYDROcodone-acetaminophen, ondansetron **OR** ondansetron (ZOFRAN) IV, polyethylene glycol  Micro Results Recent Results (from the past 240 hour(s))  Culture, blood (Routine X 2) w Reflex to ID Panel     Status: None   Collection Time: 02/15/16  1:00 PM  Result Value Ref Range Status   Specimen Description BLOOD RIGHT ARM  2 ML IN YELLOW  Final   Special Requests NONE  Final   Culture   Final    NO GROWTH 5 DAYS Performed at [facility]    Report Status 02/20/2016 FINAL  Final  Culture, blood (Routine X 2) w Reflex to ID Panel     Status: None   Collection Time: 02/15/16  1:00 PM  Result Value Ref Range Status   Specimen Description BLOOD LEFT ARM  3 ML IN YELLOW  Final   Special Requests NONE  Final   Culture   Final    NO GROWTH 5 DAYS Performed at [facility]    Report Status 02/20/2016 FINAL  Final    Radiology Reports Dg Chest 2 View  Result Date: 02/14/2016 CLINICAL DATA:  Acute onset of wheezing and shortness of breath. Initial encounter. EXAM: CHEST  2 VIEW COMPARISON:  Chest radiograph performed 08/30/2014 FINDINGS: Vascular congestion is noted, with bilateral central airspace opacification and small bilateral pleural effusions, compatible with pulmonary edema. No pneumothorax is seen. The cardiomediastinal silhouette is borderline normal in size. No acute osseous abnormalities are identified. IMPRESSION: Vascular congestion, with bilateral central airspace opacification and small bilateral pleural effusions, compatible with pulmonary edema. Electronically Signed   By: Garald Balding M.D.   On: 02/14/2016 01:35   US Renal  Result Date: 02/14/2016 CLINICAL DATA:  Acute renal failure. EXAM: RENAL / URINARY TRACT ULTRASOUND COMPLETE COMPARISON:  Renal ultrasound of March 30, 2011.  FINDINGS: Right Kidney: Length: 11.2 cm. Two simple cysts are seen in the upper pole, with the largest measuring 2.3 cm. Increased echogenicity of renal parenchyma is noted consistent with medical renal disease. No mass or hydronephrosis visualized. Left Kidney: Length: 10.7 cm. Increased echogenicity of renal parenchyma is noted consistent with medical renal disease. No mass or hydronephrosis visualized. Bladder: Decompressed secondary to Foley catheter. IMPRESSION: Simple right renal cyst. Increased echogenicity of renal parenchyma bilaterally is noted consistent with medical renal disease. Electronically Signed   By:  Marijo Conception, M.D.   On: 02/14/2016 08:36   Mr Sacrum/si Joints Wo Contrast  Result Date: 02/14/2016 CLINICAL DATA:  Right ischial pressure ulcer since September of this year. Wound culture from 01/30/2016 grew MRSA. EXAM: MR SACRUM WITHOUT CONTRAST TECHNIQUE: Multiplanar, multisequence MR imaging was performed. No intravenous contrast was administered. COMPARISON:  MRI of the pelvis 09/01/2014. FINDINGS: There is partial visualization of marrow edema in the left parasymphyseal pubic bone, unchanged. No fracture is identified. A large ulceration over the right ischial tuberosity extends to bone. The wound appears to be packed. Mildly increased T2 signal and thickening are seen about the margins of the wound which could be due to infection or granulation tissue. A small focus of marrow edema is seen subjacent to the ulcer in the ischium compatible with osteomyelitis. No soft tissue abscess is identified. An ulceration is also seen over the left ischial tuberosity which may be healed. Marked edema is seen in subcutaneous soft tissues over the right greater trochanter. Small focus of mild marrow edema is seen in the underlying greater trochanter. Bone marrow signal is normal in the sacrum. There is no SI joint effusion. Small amount of fluid is present in the hip joints bilaterally most  compatible with physiologic change. No muscle or tendon tear is seen. Imaged intrapelvic contents demonstrated a Foley catheter in place. The urinary bladder is decompressed. Its walls appear markedly thickened. IMPRESSION: Large ulceration over the right ischial tuberosity extends to bone. Mild marrow edema is seen in the subjacent ischial tuberosity compatible with osteomyelitis. Subcutaneous edema over the right greater trochanter may be due to cellulitis. Small focus of mild marrow edema in the greater trochanter is worrisome for osteomyelitis. The urinary bladder is nearly completely decompressed but its walls appear markedly thickened which may be due to chronic bladder outlet obstruction and/or cystitis. Electronically Signed   By: Inge Rise M.D.   On: 02/14/2016 16:03   Dg Chest Port 1 View  Result Date: 02/19/2016 CLINICAL DATA:  Pulmonary edema. EXAM: PORTABLE CHEST 1 VIEW COMPARISON:  02/18/2016. FINDINGS: Heart size normal. Persistent but improving bilateral pulmonary infiltrates/edema. Low lung volumes. Persistent pleural effusions, left side greater than right . No pneumothorax. Degenerative changes thoracic spine . IMPRESSION: Persistent but improving bilateral pulmonary infiltrates/edema. Low lung volumes. Persistent pleural effusions, left side greater than right. Electronically Signed   By: Marcello Moores  Register   On: 02/19/2016 07:26   Dg Chest Port 1 View  Result Date: 02/18/2016 CLINICAL DATA:  Acute respiratory distress.  Aspiration. EXAM: PORTABLE CHEST 1 VIEW COMPARISON:  02/18/2016 at 0754 hours FINDINGS: The cardiomediastinal silhouette is unchanged. Pulmonary vascular congestion and diffuse interstitial prominence, slightly increased from prior. Left mid lung and left greater than right basilar airspace opacities have mildly progressed. Small left pleural effusion may have mildly enlarged. No pneumothorax identified. IMPRESSION: Slight interval worsening of left greater than  right lung opacities suggestive of edema, although superimposed infection or aspiration is not excluded. Slightly increased size of small left pleural effusion. Electronically Signed   By: Logan Bores M.D.   On: 02/18/2016 11:15   Dg Chest Port 1 View  Result Date: 02/18/2016 CLINICAL DATA:  Acute respiratory failure.  CHF EXAM: PORTABLE CHEST 1 VIEW COMPARISON:  02/17/2016 FINDINGS: Improvement in bilateral airspace disease compatible with pulmonary edema. There remains mild-to-moderate bilateral edema. Small bilateral effusions and bibasilar atelectasis have improved. IMPRESSION: Improving congestive heart failure with edema and bilateral effusions. Bibasilar consolidation has improved. Electronically Signed  By: Franchot Gallo M.D.   On: 02/18/2016 09:23   Dg Chest Port 1 View  Result Date: 02/17/2016 CLINICAL DATA:  Acute congestive heart failure. Acute renal failure. Worsening shortness of breath and hypoxia. EXAM: PORTABLE CHEST 1 VIEW COMPARISON:  02/14/2016 FINDINGS: Worsening diffuse symmetric bilateral airspace disease is seen, most likely due to worsening pulmonary edema. Bilateral pleural effusions are again noted. No pneumothorax visualized. Heart size is stable. IMPRESSION: Worsening diffuse symmetric airspace disease, most likely due ago pulmonary edema. Bilateral pleural effusions again noted. Electronically Signed   By: Earle Gell M.D.   On: 02/17/2016 14:15   Dg Hip Unilat With Pelvis 2-3 Views Right  Result Date: 01/30/2016 CLINICAL DATA:  75 year old with approximate 6 week history of a decubitus ulcer involving the right buttock, with recent worsening of the infection. EXAM: DG HIP (WITH OR WITHOUT PELVIS) 2-3V RIGHT COMPARISON:  Right hip x-rays 05/28/2015.  MRI pelvis 09/01/2014. FINDINGS: No evidence of acute fracture or dislocation. No evidence of osteomyelitis. No visible joint effusion. Moderate joint space narrowing. Bone mineral density well-preserved. Included AP pelvis  again shows the healed osteomyelitis with new bone formation at the left ischium. Contralateral left hip joint with symmetric moderate joint space narrowing. Sacroiliac joints and symphysis pubis intact. Mild degenerative changes involving the visualized lower lumbar spine. Penile prosthesis again noted. IMPRESSION: No acute osseous abnormality. No radiographic evidence of acute osteomyelitis. Electronically Signed   By: Evangeline Dakin M.D.   On: 01/30/2016 20:36    Time Spent in minutes  25 minutes   Barton Dubois M.D on 02/23/2016 at 12:55 PM  Between 7am to 7pm - Pager - [phone] After 7pm go to [email or web address] - password [facility]  [facility] -  Office  [phone]

## 2016-02-23 NOTE — Procedures (Signed)
Pt does not want to do flutter at this time, pt states that he does on his own.

## 2016-02-23 NOTE — Procedures (Signed)
Flutter valve done at this time X10, tolerated well.

## 2016-02-24 DIAGNOSIS — L89154 Pressure ulcer of sacral region, stage 4: Secondary | ICD-10-CM

## 2016-02-24 LAB — RENAL FUNCTION PANEL
Albumin: 2.2 g/dL — ABNORMAL LOW (ref 3.5–5.0)
Anion gap: 11 (ref 5–15)
BUN: 55 mg/dL — AB (ref 6–20)
CHLORIDE: 97 mmol/L — AB (ref 101–111)
CO2: 26 mmol/L (ref 22–32)
Calcium: 8.6 mg/dL — ABNORMAL LOW (ref 8.9–10.3)
Creatinine, Ser: 4.73 mg/dL — ABNORMAL HIGH (ref 0.61–1.24)
GFR calc Af Amer: 13 mL/min — ABNORMAL LOW (ref >60)
GFR calc non Af Amer: 11 mL/min — ABNORMAL LOW (ref >60)
GLUCOSE: 176 mg/dL — AB (ref 65–99)
POTASSIUM: 4.2 mmol/L (ref 3.5–5.1)
Phosphorus: 6.3 mg/dL — ABNORMAL HIGH (ref 2.5–4.6)
Sodium: 134 mmol/L — ABNORMAL LOW (ref 135–145)

## 2016-02-24 LAB — GLUCOSE, CAPILLARY
GLUCOSE-CAPILLARY: 172 mg/dL — AB (ref 65–99)
GLUCOSE-CAPILLARY: 141 mg/dL — AB (ref 65–99)
Glucose-Capillary: 125 mg/dL — ABNORMAL HIGH (ref 65–99)

## 2016-02-24 LAB — CBC
HEMATOCRIT: 24 % — AB (ref 39.0–52.0)
Hemoglobin: 7.8 g/dL — ABNORMAL LOW (ref 13.0–17.0)
MCH: 30.1 pg (ref 26.0–34.0)
MCHC: 32.5 g/dL (ref 30.0–36.0)
MCV: 92.7 fL (ref 78.0–100.0)
Platelets: 239 10*3/uL (ref 150–400)
RBC: 2.59 MIL/uL — ABNORMAL LOW (ref 4.22–5.81)
RDW: 18.2 % — AB (ref 11.5–15.5)
WBC: 7.5 10*3/uL (ref 4.0–10.5)

## 2016-02-24 MED ORDER — SODIUM CHLORIDE 0.9 % IV SOLN: 100.0000 mL | INTRAVENOUS | Status: DC | PRN

## 2016-02-24 MED ORDER — ALTEPLASE 2 MG IJ SOLR: 2.0000 mg | Freq: Once | INTRAMUSCULAR | Status: DC | PRN

## 2016-02-24 MED ORDER — LIDOCAINE-PRILOCAINE 2.5-2.5 % EX CREA: 1.0000 "application " | TOPICAL_CREAM | CUTANEOUS | Status: DC | PRN

## 2016-02-24 MED ORDER — PENTAFLUOROPROP-TETRAFLUOROETH EX AERO: 1.0000 "application " | INHALATION_SPRAY | CUTANEOUS | Status: DC | PRN

## 2016-02-24 MED ORDER — HEPARIN SODIUM (PORCINE) 1000 UNIT/ML DIALYSIS: 2500.0000 [IU] | INTRAMUSCULAR | Status: DC | PRN

## 2016-02-24 MED ORDER — SODIUM CHLORIDE 0.9 % IV SOLN
600.0000 mg | INTRAVENOUS | Status: DC
  Administered 2016-02-26: 600 mg via INTRAVENOUS
  Filled 2016-02-24 (×2): qty 12

## 2016-02-24 MED ORDER — HEPARIN SODIUM (PORCINE) 1000 UNIT/ML DIALYSIS: 1000.0000 [IU] | INTRAMUSCULAR | Status: DC | PRN

## 2016-02-24 MED ORDER — LIDOCAINE HCL (PF) 1 % IJ SOLN: 5.0000 mL | INTRAMUSCULAR | Status: DC | PRN

## 2016-02-24 NOTE — Consult Note (Signed)
   [facility] Inpatient Consult   02/24/2016  Lance [name] [birth year] [id number]    [facility] Care Management follow up. Spoke with inpatient RNCM. Patient is from SNF. Likely to return to SNF. However, if discharge plans change, requested for [facility] Care Management consult to be placed.   Marthenia Rolling, MSN-Ed, RN,BSN [facility] Liaison [phone]

## 2016-02-24 NOTE — Progress Notes (Signed)
Pharmacy Antibiotic Note  Lance [name] is a 75 y.o. male admitted on 02/13/2016 with new onset dyspnea, and found to have severe bilateral pulmonary edema.  He was continued on Daptomycin per ID starting on 10/14 for MRSA osteomyelitis.  CRRT- CVVHDF was started on 10/16 and stopped on 10/19, now transitioned to IHD.   The patient received a dose of Daptomycin post HD on 10/21 and 10/23 appropriately however it is also noted that it is charted as given off-chedule on 10/22 AM (unclear why this dose was given). Will monitor for any adverse effects, next CK check is on Saturday.   Plan: - Continue Daptomycin 600 mg (~8 mg/kg) post HD - Assuming MWF HD sessions for now - but will monitor for any off-schedule sessions - Will monitor CKs weekly  Height: 6\' 4"  (193 cm) Weight: 162 lb 11.2 oz (73.8 kg) IBW/kg (Calculated) : 86.8  Temp (24hrs), Avg:98 F (36.7 C), Min:97.6 F (36.4 C), Max:98.6 F (37 C)   Recent Labs Lab 02/18/16 0535  02/19/16 0358 02/19/16 0445  02/20/16 0420 02/21/16 1119 02/22/16 0742 02/22/16 0743 02/23/16 0630 02/24/16 0715  WBC 12.1*  --  9.6 9.5  --   --   --  10.1  --   --  7.5  CREATININE 2.44*  < >  --  1.78*  < > 1.60* 3.79*  --  3.83* 3.51* 4.73*  < > = values in this interval not displayed.  Estimated Creatinine Clearance: 14.3 mL/min (by C-G formula based on SCr of 4.73 mg/dL (H)).    No Known Allergies  Antimicrobials this admission: Daptomycin 10/14 >> (6 weeks)  Dose adjustments this admission:  Microbiology results: Microbiology results:  10/14 BCx: ngtd  9/28 wound: MRSA, enterococcus faecalis (amp susc) 9/28 urine: Klebsiella, Viridans strep 9/28 blood: NGF   Thank you for allowing pharmacy to be a part of this patient's care.  Alycia Rossetti, PharmD, BCPS Clinical Pharmacist Pager: [phone] Clinical phone for 02/24/2016 from 7a-3:30p: [phone] If after 3:30p, please call main pharmacy at: [phone] 02/24/2016 3:04 PM

## 2016-02-24 NOTE — Progress Notes (Addendum)
PROGRESS NOTE                                                                                                                                                                                                             Patient Demographics:    Lance [name], is a 75 y.o. male, [name] - [birth year], XVQ:[id number]  Admit date - 02/13/2016   Admitting Physician Edwin Dada, MD  Outpatient Primary MD for the patient is [name], MD  LOS - 10  Outpatient Specialists:   Chief Complaint  Patient presents with  . Respiratory Distress       Brief Narrative   75 year old male with history of multiple sclerosis, spastic quadriplegia, chronic Foley for urinary retention, chronic kidney disease stage III, diabetes mellitus, hypertension, history of decubitus ulcers with history of ischial osteomyelitis, who was recently hospitalized due to right ischial pressure ulcer requiring debridement and treated with vancomycin and Zosyn. He was then discharged on oral Bactrim (both for the ulcer and Klebsiella/strep UTI). He was discharged to SNF but returned with new onset dyspnea and hypoxemia. Chest x-ray showed pulmonary edema with worsened renal function. He had elevated BNP >900 and mildly elevated troponins. Patient found to have acute cardiomyopathy with acute respiratory failure requiring BiPAP and transferred to ICU. He also developed worsened renal function concerning for cardiorenal syndrome now requiring CRRT.    Subjective:  Afebrile, in no distress. denies CP and SOB. Had HD again today. Renal function still going up between treatments and with decreased urine output.   Assessment  & Plan :    Principal Problem:   Acute hypoxic respiratory failure ([place]) Secondary to acute cardiomyopathy with pulmonary edema. now requiring CRRT. Wean oxygen to room air. Pulmonary edema with transudative fluids on CXR, improving  with CRRT. Pulmonary was consulted and Recommended volume management with CRRT. Condition improved, no longer SOB and with good O2 sat on RA -now managing volume with intermittent HD.  Active Problems: Acute systolic CHF/nonischemic cardiomyopathy -EF of 35-40% as per echo. Elevated troponin likely due to demand ischemia. No wall motion abnormalities. -On Coreg. Lasix discontinued due to worsening renal function. Not on ACEi/ ARB due to renal failure. -continue statins and ASA -Plan for intermittent HD for volume control in expectancy for kidneys to recover function. -Had HD today 10/23; will follow renal function  in am and follow nephrology rec's for next treatment .  Acute on chronic kidney disease (stage 3-4 at baseline) -Appears baseline creatinine of 1.8. Suspect cardiorenal etiology. ASO titre positive, ? Immune complex glomerulonephritis. -Patient started on CRRT, via rt femoral catheter. Tolerated well and volume status improved.  -[facility]; expectations is for kidney recovery. -Cr 4.73 10/23 prior to HD treatment again  -will follow renal function again in am  -nephrology to decide next treatment  Decubitus stage IV ulcer with Right ischial osteomyelitis -As suggested on MRI. Patient being followed by plastic surgeon at [facility] for possible muscle flap surgery at some point. -Prior culture from ischial wounds grew MRSA and enterococcus. Started on 6 weeks course of IV daptomycin by ID recommendations. -no fever and non-toxic appearance currently  -last antibiotic dose anticipated Mar 30, 2016  Severe protein calorie malnutrition -continue feeding supplement. -nutritonal service consulted  DM (diabetes mellitus) type II controlled with renal manifestation (HCC) -Currently on reduced dose Levemir 10 units at bedtime with sliding scale coverage. -Last A1c of 6.1. -will follow CBG's and adjust hypoglycemic regimen as needed   Multiple  sclerosis ([place]) -With spastic paraplegia diagnosed in mid 58s. -limited mobility and essentially wheelchair  Bound   Normocytic anemia -At baseline. -IV iron and aranesp as per renal discretion -no signs of acute GI bleed  Chronic Foley catheter  -history of recurrent UTI.  -On suppressive trimethoprim at home.  -Continue oxybutynin. -foley in place  Code Status : Full code  Family Communication  : friend at bedside   Disposition Plan  :remains inpatient; will follow rec's from renal service   Barriers For Discharge :receiving HD intermittently; kidneys still not recovered and volume status still been adjusted through HD.  Consults  :   Renal Cardiology Pulmonary ID  Procedures  :  2-D echo CRRT Now receiving intermittent HD MRI sacrum  DVT Prophylaxis  :  SCDs  Lab Results  Component Value Date   PLT 239 02/24/2016    Antibiotics  :   Anti-infectives    Start     Dose/Rate Route Frequency Ordered Stop   02/26/16 1800  DAPTOmycin (CUBICIN) 600 mg in sodium chloride 0.9 % IVPB     600 mg 224 mL/hr over 30 Minutes Intravenous Every M-W-F (1800) 02/24/16 1517     02/24/16 1200  DAPTOmycin (CUBICIN) 600 mg in sodium chloride 0.9 % IVPB  Status:  Discontinued     600 mg 224 mL/hr over 30 Minutes Intravenous Every M-W-F (Hemodialysis) 02/23/16 1625 02/24/16 1517   02/22/16 2200  DAPTOmycin (CUBICIN) 600 mg in sodium chloride 0.9 % IVPB  Status:  Discontinued     600 mg 224 mL/hr over 30 Minutes Intravenous Every T-Th-Sa (Hemodialysis) 02/21/16 1324 02/23/16 1625   02/21/16 2200  DAPTOmycin (CUBICIN) 600 mg in sodium chloride 0.9 % IVPB  Status:  Discontinued     600 mg 224 mL/hr over 30 Minutes Intravenous Every M-W-F (Hemodialysis) 02/20/16 1001 02/21/16 1324   02/19/16 0600  DAPTOmycin (CUBICIN) 500 mg in sodium chloride 0.9 % IVPB  Status:  Discontinued     500 mg 220 mL/hr over 30 Minutes Intravenous Every 24 hours 02/18/16 1413 02/20/16 1001   02/15/16 1500   DAPTOmycin (CUBICIN) 676 mg in sodium chloride 0.9 % IVPB  Status:  Discontinued     8 mg/kg  84.5 kg 227 mL/hr over 30 Minutes Intravenous Every 48 hours 02/15/16 1407 02/18/16 1413   02/15/16  1400  DAPTOmycin (CUBICIN) 676 mg in sodium chloride 0.9 % IVPB  Status:  Discontinued     8 mg/kg  84.5 kg 227 mL/hr over 30 Minutes Intravenous Every 24 hours 02/15/16 1353 02/15/16 1406   02/14/16 0215  vancomycin (VANCOCIN) IVPB 1000 mg/200 mL premix  Status:  Discontinued     1,000 mg 200 mL/hr over 60 Minutes Intravenous  Once 02/14/16 0212 02/14/16 0243   02/14/16 0145  cefTRIAXone (ROCEPHIN) 1 g in dextrose 5 % 50 mL IVPB     1 g 100 mL/hr over 30 Minutes Intravenous  Once 02/14/16 0144 02/14/16 0232        Objective:   Vitals:   02/24/16 1030 02/24/16 1051 02/24/16 1055 02/24/16 1216  BP: 109/71 (!) 121/49 (!) 119/53 (!) 109/49  Pulse: 66 83 82 85  Resp: 14 20 19    Temp:   97.8 F (36.6 C)   TempSrc:   Oral   SpO2:      Weight:   73.8 kg (162 lb 11.2 oz)   Height:        Wt Readings from Last 3 Encounters:  02/24/16 73.8 kg (162 lb 11.2 oz)  02/13/16 85 kg (187 lb 6.4 oz)  02/06/16 79.9 kg (176 lb 3.2 oz)     Intake/Output Summary (Last 24 hours) at 02/24/16 1612 Last data filed at 02/24/16 1055  Gross per 24 hour  Intake              420 ml  Output             1000 ml  Net             -580 ml     Physical Exam Gen: Elderly male appears frail and chronically ill; afebrile and in no distress. Patient seen right after HD; tolerated treatment w/o problems. No complaining of CP or SOB.  HEENT: moist mucosa, supple neck, no JVD appreciated  Chest: improved lung breath sounds b/l, no frank crackles or wheezing  CVS: N S1&S2, no murmurs, rubs or gallop GI: soft, NT, ND, BS+, chronic Foley Musculoskeletal: warm, sacral decubitus ulcer appreciated, positive serosanguineous drainage  CNS: Alert and oriented, paraplegic, able to follow commands.    Data Review:     CBC  Recent Labs Lab 02/18/16 0535 02/19/16 0358 02/19/16 0445 02/22/16 0742 02/24/16 0715  WBC 12.1* 9.6 9.5 10.1 7.5  HGB 9.7* 9.3* 9.2* 8.2* 7.8*  HCT 29.9* 27.5* 27.6* 25.5* 24.0*  PLT 224 243 237 250 239  MCV 93.7 90.2 90.8 93.1 92.7  MCH 30.4 30.5 30.3 29.9 30.1  MCHC 32.4 33.8 33.3 32.2 32.5  RDW 17.8* 17.5* 17.6* 18.3* 18.2*  LYMPHSABS 2.4 2.5 2.5  --   --   MONOABS 1.2* 1.1* 1.2*  --   --   EOSABS 0.0 0.0 0.0  --   --   BASOSABS 0.0 0.0 0.0  --   --     Chemistries   Recent Labs Lab 02/18/16 0535  02/19/16 0445  02/20/16 0420 02/21/16 1119 02/22/16 0742 02/22/16 0743 02/23/16 0630 02/24/16 0715  NA 139  < > 138  < > 136 134*  --  137 133* 134*  K 4.6  < > 4.5  < > 4.3 4.5  --  4.1 3.9 4.2  CL 105  < > 103  < > 102 100*  --  101 97* 97*  CO2 23  < > 27  < > 24 24  --  26 25 26   GLUCOSE 115*  < > 104*  < > 139* 321*  --  151* 104* 176*  BUN 70*  < > 38*  < > 25* 52*  --  54* 35* 55*  CREATININE 2.44*  < > 1.78*  < > 1.60* 3.79*  --  3.83* 3.51* 4.73*  CALCIUM 8.3*  < > 8.5*  < > 8.6* 8.7*  --  8.8* 8.2* 8.6*  MG 2.7*  --  2.7*  --  2.6*  --   --   --   --   --   ALT  --   --   --   --   --   --  15*  --   --   --   < > = values in this interval not displayed. ------------------------------------------------------------------------------------------------------------------  Lab Results  Component Value Date   HGBA1C 6.0 (H) 02/14/2016   ------------------------------------------------------------------------------------------------------------------ No results for input(s): TSH, T4TOTAL, T3FREE, THYROIDAB in the last 72 hours.  Invalid input(s): FREET3 ------------------------------------------------------------------------------------------------------------------  Cardiac Enzymes No results for input(s): CKMB, TROPONINI, MYOGLOBIN in the last 168 hours.  Invalid input(s):  CK ------------------------------------------------------------------------------------------------------------------    Component Value Date/Time   BNP 927.0 (H) 02/14/2016 0020    Inpatient Medications  Scheduled Meds: . aspirin  81 mg Oral Daily  . carvedilol  6.25 mg Oral BID WC  . collagenase   Topical Daily  . [START ON 02/26/2016] DAPTOmycin (CUBICIN)  IV  600 mg Intravenous Q M,W,F-1800  . feeding supplement (NEPRO CARB STEADY)  237 mL Oral TID BM  . heparin subcutaneous  5,000 Units Subcutaneous Q8H  . insulin aspart  0-5 Units Subcutaneous QHS  . insulin aspart  0-9 Units Subcutaneous TID WC  . insulin detemir  10 Units Subcutaneous Daily  . mouth rinse  15 mL Mouth Rinse q12n4p  . multivitamin with minerals  1 tablet Oral Daily  . oxybutynin  5 mg Oral BID   Continuous Infusions:   PRN Meds:.acetaminophen **OR** acetaminophen, ALPRAZolam, bisacodyl, HYDROcodone-acetaminophen, ondansetron **OR** ondansetron (ZOFRAN) IV, polyethylene glycol  Micro Results Recent Results (from the past 240 hour(s))  Culture, blood (Routine X 2) w Reflex to ID Panel     Status: None   Collection Time: 02/15/16  1:00 PM  Result Value Ref Range Status   Specimen Description BLOOD RIGHT ARM  2 ML IN YELLOW  Final   Special Requests NONE  Final   Culture   Final    NO GROWTH 5 DAYS Performed at [facility]    Report Status 02/20/2016 FINAL  Final  Culture, blood (Routine X 2) w Reflex to ID Panel     Status: None   Collection Time: 02/15/16  1:00 PM  Result Value Ref Range Status   Specimen Description BLOOD LEFT ARM  3 ML IN YELLOW  Final   Special Requests NONE  Final   Culture   Final    NO GROWTH 5 DAYS Performed at [facility]    Report Status 02/20/2016 FINAL  Final    Radiology Reports Dg Chest 2 View  Result Date: 02/14/2016 CLINICAL DATA:  Acute onset of wheezing and shortness of breath. Initial encounter. EXAM: CHEST  2 VIEW COMPARISON:  Chest  radiograph performed 08/30/2014 FINDINGS: Vascular congestion is noted, with bilateral central airspace opacification and small bilateral pleural effusions, compatible with pulmonary edema. No pneumothorax is seen. The cardiomediastinal silhouette is borderline normal in size. No acute osseous abnormalities are identified. IMPRESSION: Vascular congestion,  with bilateral central airspace opacification and small bilateral pleural effusions, compatible with pulmonary edema. Electronically Signed   By: Garald Balding M.D.   On: 02/14/2016 01:35   US Renal  Result Date: 02/14/2016 CLINICAL DATA:  Acute renal failure. EXAM: RENAL / URINARY TRACT ULTRASOUND COMPLETE COMPARISON:  Renal ultrasound of March 30, 2011. FINDINGS: Right Kidney: Length: 11.2 cm. Two simple cysts are seen in the upper pole, with the largest measuring 2.3 cm. Increased echogenicity of renal parenchyma is noted consistent with medical renal disease. No mass or hydronephrosis visualized. Left Kidney: Length: 10.7 cm. Increased echogenicity of renal parenchyma is noted consistent with medical renal disease. No mass or hydronephrosis visualized. Bladder: Decompressed secondary to Foley catheter. IMPRESSION: Simple right renal cyst. Increased echogenicity of renal parenchyma bilaterally is noted consistent with medical renal disease. Electronically Signed   By: Marijo Conception, M.D.   On: 02/14/2016 08:36   Mr Sacrum/si Joints Wo Contrast  Result Date: 02/14/2016 CLINICAL DATA:  Right ischial pressure ulcer since September of this year. Wound culture from 01/30/2016 grew MRSA. EXAM: MR SACRUM WITHOUT CONTRAST TECHNIQUE: Multiplanar, multisequence MR imaging was performed. No intravenous contrast was administered. COMPARISON:  MRI of the pelvis 09/01/2014. FINDINGS: There is partial visualization of marrow edema in the left parasymphyseal pubic bone, unchanged. No fracture is identified. A large ulceration over the right ischial tuberosity  extends to bone. The wound appears to be packed. Mildly increased T2 signal and thickening are seen about the margins of the wound which could be due to infection or granulation tissue. A small focus of marrow edema is seen subjacent to the ulcer in the ischium compatible with osteomyelitis. No soft tissue abscess is identified. An ulceration is also seen over the left ischial tuberosity which may be healed. Marked edema is seen in subcutaneous soft tissues over the right greater trochanter. Small focus of mild marrow edema is seen in the underlying greater trochanter. Bone marrow signal is normal in the sacrum. There is no SI joint effusion. Small amount of fluid is present in the hip joints bilaterally most compatible with physiologic change. No muscle or tendon tear is seen. Imaged intrapelvic contents demonstrated a Foley catheter in place. The urinary bladder is decompressed. Its walls appear markedly thickened. IMPRESSION: Large ulceration over the right ischial tuberosity extends to bone. Mild marrow edema is seen in the subjacent ischial tuberosity compatible with osteomyelitis. Subcutaneous edema over the right greater trochanter may be due to cellulitis. Small focus of mild marrow edema in the greater trochanter is worrisome for osteomyelitis. The urinary bladder is nearly completely decompressed but its walls appear markedly thickened which may be due to chronic bladder outlet obstruction and/or cystitis. Electronically Signed   By: Inge Rise M.D.   On: 02/14/2016 16:03   Dg Chest Port 1 View  Result Date: 02/19/2016 CLINICAL DATA:  Pulmonary edema. EXAM: PORTABLE CHEST 1 VIEW COMPARISON:  02/18/2016. FINDINGS: Heart size normal. Persistent but improving bilateral pulmonary infiltrates/edema. Low lung volumes. Persistent pleural effusions, left side greater than right . No pneumothorax. Degenerative changes thoracic spine . IMPRESSION: Persistent but improving bilateral pulmonary  infiltrates/edema. Low lung volumes. Persistent pleural effusions, left side greater than right. Electronically Signed   By: Marcello Moores  Register   On: 02/19/2016 07:26   Dg Chest Port 1 View  Result Date: 02/18/2016 CLINICAL DATA:  Acute respiratory distress.  Aspiration. EXAM: PORTABLE CHEST 1 VIEW COMPARISON:  02/18/2016 at 0754 hours FINDINGS: The cardiomediastinal silhouette is unchanged. Pulmonary vascular  congestion and diffuse interstitial prominence, slightly increased from prior. Left mid lung and left greater than right basilar airspace opacities have mildly progressed. Small left pleural effusion may have mildly enlarged. No pneumothorax identified. IMPRESSION: Slight interval worsening of left greater than right lung opacities suggestive of edema, although superimposed infection or aspiration is not excluded. Slightly increased size of small left pleural effusion. Electronically Signed   By: Logan Bores M.D.   On: 02/18/2016 11:15   Dg Chest Port 1 View  Result Date: 02/18/2016 CLINICAL DATA:  Acute respiratory failure.  CHF EXAM: PORTABLE CHEST 1 VIEW COMPARISON:  02/17/2016 FINDINGS: Improvement in bilateral airspace disease compatible with pulmonary edema. There remains mild-to-moderate bilateral edema. Small bilateral effusions and bibasilar atelectasis have improved. IMPRESSION: Improving congestive heart failure with edema and bilateral effusions. Bibasilar consolidation has improved. Electronically Signed   By: Franchot Gallo M.D.   On: 02/18/2016 09:23   Dg Chest Port 1 View  Result Date: 02/17/2016 CLINICAL DATA:  Acute congestive heart failure. Acute renal failure. Worsening shortness of breath and hypoxia. EXAM: PORTABLE CHEST 1 VIEW COMPARISON:  02/14/2016 FINDINGS: Worsening diffuse symmetric bilateral airspace disease is seen, most likely due to worsening pulmonary edema. Bilateral pleural effusions are again noted. No pneumothorax visualized. Heart size is stable. IMPRESSION:  Worsening diffuse symmetric airspace disease, most likely due ago pulmonary edema. Bilateral pleural effusions again noted. Electronically Signed   By: Earle Gell M.D.   On: 02/17/2016 14:15   Dg Hip Unilat With Pelvis 2-3 Views Right  Result Date: 01/30/2016 CLINICAL DATA:  76 year old with approximate 6 week history of a decubitus ulcer involving the right buttock, with recent worsening of the infection. EXAM: DG HIP (WITH OR WITHOUT PELVIS) 2-3V RIGHT COMPARISON:  Right hip x-rays 05/28/2015.  MRI pelvis 09/01/2014. FINDINGS: No evidence of acute fracture or dislocation. No evidence of osteomyelitis. No visible joint effusion. Moderate joint space narrowing. Bone mineral density well-preserved. Included AP pelvis again shows the healed osteomyelitis with new bone formation at the left ischium. Contralateral left hip joint with symmetric moderate joint space narrowing. Sacroiliac joints and symphysis pubis intact. Mild degenerative changes involving the visualized lower lumbar spine. Penile prosthesis again noted. IMPRESSION: No acute osseous abnormality. No radiographic evidence of acute osteomyelitis. Electronically Signed   By: Evangeline Dakin M.D.   On: 01/30/2016 20:36    Time Spent in minutes  25 minutes   Barton Dubois M.D on 02/24/2016 at 4:12 PM  Between 7am to 7pm - Pager - [phone] After 7pm go to [email or web address] - password [facility]  [facility] -  Office  [phone]

## 2016-02-24 NOTE — Progress Notes (Signed)
Admit: 02/13/2016 LOS: 10  2M AoCKD3 (BL 1.7-1.8) after admit with pulm edema, pelvic OM on TMP/SMX; req RRT (CRRT-->iHD)  Subjective:  iHD this AM 1L net negative; tol well No UOP reported Change in SCr and BUn reflect low GFR Feels ok   10/22 0701 - 10/23 0700 In: 900 [P.O.:660; NG/GT:240] Out: 0   Filed Weights   02/23/16 2137 02/24/16 0645 02/24/16 1055  Weight: 82.2 kg (181 lb 3.5 oz) 74.8 kg (164 lb 14.5 oz) 73.8 kg (162 lb 11.2 oz)    Scheduled Meds: . aspirin  81 mg Oral Daily  . carvedilol  6.25 mg Oral BID WC  . collagenase   Topical Daily  . DAPTOmycin (CUBICIN)  IV  600 mg Intravenous Q M,W,F-HD  . feeding supplement (NEPRO CARB STEADY)  237 mL Oral TID BM  . heparin subcutaneous  5,000 Units Subcutaneous Q8H  . insulin aspart  0-5 Units Subcutaneous QHS  . insulin aspart  0-9 Units Subcutaneous TID WC  . insulin detemir  10 Units Subcutaneous Daily  . mouth rinse  15 mL Mouth Rinse q12n4p  . multivitamin with minerals  1 tablet Oral Daily  . oxybutynin  5 mg Oral BID   Continuous Infusions:  PRN Meds:.acetaminophen **OR** acetaminophen, ALPRAZolam, bisacodyl, HYDROcodone-acetaminophen, ondansetron **OR** ondansetron (ZOFRAN) IV, polyethylene glycol  Current Labs: reviewed   Physical Exam:  Blood pressure (!) 119/53, pulse 82, temperature 97.8 F (36.6 C), temperature source Oral, resp. rate 19, height 6\' 4"  (1.93 m), weight 73.8 kg (162 lb 11.2 oz), SpO2 96 %. NAD RRR No edema CTAB AAOx3 S/nd/nd  A 1. AoCKD3, dialysis dependent, using temp HD cath 1. Etiology: TMP/SMX, Cardiorenal, IC mediated with OM, ? vanc 2. Ischial OM on daptomycin 3. Pulm Edema acute systolic CHF, resolved 4. MS, wheelchair depdendent 5. HTN 6. Spastic Quadriplegia 7. DM2 8. Anemia  P 1. Monitor for recovery of function 2. Consider moving to [facility] over course of week 3. Daily weights, Daily Renal Panel, Strict I/Os, Avoid nephrotoxins (NSAIDs, judicious IV Contrast)     Pearson Grippe MD 02/24/2016, 12:12 PM   Recent Labs Lab 02/22/16 0743 02/23/16 0630 02/24/16 0715  NA 137 133* 134*  K 4.1 3.9 4.2  CL 101 97* 97*  CO2 26 25 26   GLUCOSE 151* 104* 176*  BUN 54* 35* 55*  CREATININE 3.83* 3.51* 4.73*  CALCIUM 8.8* 8.2* 8.6*  PHOS 3.7 4.8* 6.3*    Recent Labs Lab 02/18/16 0535 02/19/16 0358 02/19/16 0445 02/22/16 0742 02/24/16 0715  WBC 12.1* 9.6 9.5 10.1 7.5  NEUTROABS 8.5* 6.0 5.8  --   --   HGB 9.7* 9.3* 9.2* 8.2* 7.8*  HCT 29.9* 27.5* 27.6* 25.5* 24.0*  MCV 93.7 90.2 90.8 93.1 92.7  PLT 224 243 237 250 239

## 2016-02-25 LAB — RENAL FUNCTION PANEL
ANION GAP: 10 (ref 5–15)
Albumin: 2.2 g/dL — ABNORMAL LOW (ref 3.5–5.0)
BUN: 29 mg/dL — ABNORMAL HIGH (ref 6–20)
CHLORIDE: 97 mmol/L — AB (ref 101–111)
CO2: 27 mmol/L (ref 22–32)
Calcium: 8.4 mg/dL — ABNORMAL LOW (ref 8.9–10.3)
Creatinine, Ser: 3.29 mg/dL — ABNORMAL HIGH (ref 0.61–1.24)
GFR calc non Af Amer: 17 mL/min — ABNORMAL LOW (ref >60)
GFR, EST AFRICAN AMERICAN: 20 mL/min — AB (ref >60)
Glucose, Bld: 120 mg/dL — ABNORMAL HIGH (ref 65–99)
Phosphorus: 4.2 mg/dL (ref 2.5–4.6)
Potassium: 4.9 mmol/L (ref 3.5–5.1)
Sodium: 134 mmol/L — ABNORMAL LOW (ref 135–145)

## 2016-02-25 LAB — GLUCOSE, CAPILLARY
Glucose-Capillary: 147 mg/dL — ABNORMAL HIGH (ref 65–99)
Glucose-Capillary: 174 mg/dL — ABNORMAL HIGH (ref 65–99)
Glucose-Capillary: 128 mg/dL — ABNORMAL HIGH (ref 65–99)
Glucose-Capillary: 190 mg/dL — ABNORMAL HIGH (ref 65–99)

## 2016-02-25 NOTE — Progress Notes (Signed)
Admit: 02/13/2016 LOS: 11  77M AoCKD3 (BL 1.7-1.8) after admit with pulm edema, pelvic OM on TMP/SMX; req RRT (CRRT-->iHD)  Subjective:  No new events Little UOP Has fem temp cath on R  10/23 0701 - 10/24 0700 In: 240 [P.O.:240] Out: 1250 [Urine:250]  Filed Weights   02/24/16 0645 02/24/16 1055 02/24/16 2109  Weight: 74.8 kg (164 lb 14.5 oz) 73.8 kg (162 lb 11.2 oz) 77.6 kg (171 lb 1.2 oz)    Scheduled Meds: . aspirin  81 mg Oral Daily  . carvedilol  6.25 mg Oral BID WC  . collagenase   Topical Daily  . [START ON 02/26/2016] DAPTOmycin (CUBICIN)  IV  600 mg Intravenous Q M,W,F-1800  . feeding supplement (NEPRO CARB STEADY)  237 mL Oral TID BM  . heparin subcutaneous  5,000 Units Subcutaneous Q8H  . insulin aspart  0-5 Units Subcutaneous QHS  . insulin aspart  0-9 Units Subcutaneous TID WC  . insulin detemir  10 Units Subcutaneous Daily  . mouth rinse  15 mL Mouth Rinse q12n4p  . multivitamin with minerals  1 tablet Oral Daily  . oxybutynin  5 mg Oral BID   Continuous Infusions:  PRN Meds:.acetaminophen **OR** acetaminophen, ALPRAZolam, bisacodyl, HYDROcodone-acetaminophen, ondansetron **OR** ondansetron (ZOFRAN) IV, polyethylene glycol  Current Labs: reviewed   Physical Exam:  Blood pressure (!) 122/49, pulse 72, temperature 98.3 F (36.8 C), temperature source Oral, resp. rate 17, height 6\' 4"  (1.93 m), weight 77.6 kg (171 lb 1.2 oz), SpO2 97 %. NAD RRR No edema CTAB AAOx3 S/nd/nd  A 1. AoCKD3, dialysis dependent, using temp Fem HD cath 1. Etiology: TMP/SMX, Cardiorenal, IC mediated with OM, ? vanc 2. Ischial OM on daptomycin 3. Pulm Edema acute systolic CHF, resolved 4. MS, wheelchair depdendent 5. HTN 6. Spastic Quadriplegia 7. DM2 8. Anemia  P 1. Monitor for recovery of function 2. If still need HD tomorrow will arrange [facility] placement 3. Might need acute outpt HD 4. Daily weights, Daily Renal Panel, Strict I/Os, Avoid nephrotoxins (NSAIDs, judicious  IV Contrast)    Pearson Grippe MD 02/25/2016, 10:41 AM   Recent Labs Lab 02/23/16 0630 02/24/16 0715 02/25/16 0420  NA 133* 134* 134*  K 3.9 4.2 4.9  CL 97* 97* 97*  CO2 25 26 27   GLUCOSE 104* 176* 120*  BUN 35* 55* 29*  CREATININE 3.51* 4.73* 3.29*  CALCIUM 8.2* 8.6* 8.4*  PHOS 4.8* 6.3* 4.2    Recent Labs Lab 02/19/16 0358 02/19/16 0445 02/22/16 0742 02/24/16 0715  WBC 9.6 9.5 10.1 7.5  NEUTROABS 6.0 5.8  --   --   HGB 9.3* 9.2* 8.2* 7.8*  HCT 27.5* 27.6* 25.5* 24.0*  MCV 90.2 90.8 93.1 92.7  PLT 243 237 250 239

## 2016-02-25 NOTE — Procedures (Signed)
Flutter done at this time X10, pt tolerated well

## 2016-02-25 NOTE — Progress Notes (Signed)
PROGRESS NOTE                                                                                                                                                                                                             Patient Demographics:    Lance [name], is a 75 y.o. male, [name] - [birth year], CBJ:[id number]  Admit date - 02/13/2016   Admitting Physician Edwin Dada, MD  Outpatient Primary MD for the patient is [name], MD  LOS - 11  Outpatient Specialists:   Chief Complaint  Patient presents with  . Respiratory Distress       Brief Narrative   75 year old male with history of multiple sclerosis, spastic quadriplegia, chronic Foley for urinary retention, chronic kidney disease stage III, diabetes mellitus, hypertension, history of decubitus ulcers with history of ischial osteomyelitis, who was recently hospitalized due to right ischial pressure ulcer requiring debridement and treated with vancomycin and Zosyn. He was then discharged on oral Bactrim (both for the ulcer and Klebsiella/strep UTI). He was discharged to SNF but returned with new onset dyspnea and hypoxemia. Chest x-ray showed pulmonary edema with worsened renal function. He had elevated BNP >900 and mildly elevated troponins. Patient found to have acute cardiomyopathy with acute respiratory failure requiring BiPAP and transferred to ICU. He also developed worsened renal function concerning for cardiorenal syndrome now requiring CRRT.    Subjective:  Afebrile, in no distress. Denies CP and SOB. Had HD again on 10/23. Renal function improved after HD. Urine output still poor (approx 200-250CC in 24 hours).   Assessment  & Plan :    Principal Problem:   Acute hypoxic respiratory failure ([place]) Secondary to acute cardiomyopathy with pulmonary edema. now requiring CRRT. Wean oxygen to room air. Pulmonary edema with transudative fluids on CXR,  improving with CRRT. Pulmonary was consulted and Recommended volume management with CRRT. Condition improved, no longer SOB and with good O2 sat on RA -now managing volume with intermittent HD.  Active Problems: Acute systolic CHF/nonischemic cardiomyopathy -EF of 35-40% as per echo. Elevated troponin likely due to demand ischemia. No wall motion abnormalities. -On Coreg. Lasix discontinued due to worsening renal function. Not on ACEi/ ARB due to renal failure. -continue statins and ASA -Plan for intermittent HD for volume control in expectancy for kidneys to recover function. -Had HD yesterday 10/23; will  follow renal function in am again and follow nephrology rec's for next treatment .  Acute on chronic kidney disease (stage 3-4 at baseline) -Appears baseline creatinine of 1.8. Suspect cardiorenal etiology. ASO titre positive, ? Immune complex glomerulonephritis. -Patient started on CRRT, via rt femoral catheter. Tolerated well and volume status improved.  -[facility]; expectations are for kidney recovery. -Cr 3.29 10/24 after HD treatment on 10/23  -will follow renal function again in am  -nephrology to decide next treatment  Decubitus stage IV ulcer with Right ischial osteomyelitis -As suggested on MRI. Patient being followed by plastic surgeon at [facility] for possible muscle flap surgery at some point. -Prior culture from ischial wounds grew MRSA and enterococcus. Started on 6 weeks course of IV daptomycin by ID recommendations. -no fever and non-toxic appearance currently  -last antibiotic dose anticipated Mar 30, 2016  Severe protein calorie malnutrition -continue feeding supplement. -nutritonal service consulted, will follow rec's  DM (diabetes mellitus) type II controlled with renal manifestation ([facility]) -Currently on reduced dose Levemir 10 units at bedtime with sliding scale coverage. -Last A1c of 6.1. -will follow CBG's and adjust  hypoglycemic regimen as needed   Multiple sclerosis ([place]) -With spastic paraplegia diagnosed in mid 22s. -limited mobility and essentially wheelchair  Bound   Normocytic anemia -At baseline. -IV iron and aranesp as per renal discretion -no signs of acute GI bleed -hgb 7.8 last checked on 10/23  Chronic Foley catheter  -history of recurrent UTI.  -On suppressive trimethoprim at home.  -Continue oxybutynin. -foley in place  Code Status : Full code  Family Communication  : wife and sister at bedside 10/24  Disposition Plan  :remains inpatient; will follow rec's from renal service   Barriers For Discharge :receiving HD intermittently; kidneys still not recovered and volume status still been adjusted through HD.  Consults  :   Renal Cardiology Pulmonary ID  Procedures  :  2-D echo CRRT Now receiving intermittent HD MRI sacrum  DVT Prophylaxis  :  SCDs  Lab Results  Component Value Date   PLT 239 02/24/2016    Antibiotics  :   Anti-infectives    Start     Dose/Rate Route Frequency Ordered Stop   02/26/16 1800  DAPTOmycin (CUBICIN) 600 mg in sodium chloride 0.9 % IVPB     600 mg 224 mL/hr over 30 Minutes Intravenous Every M-W-F (1800) 02/24/16 1517     02/24/16 1200  DAPTOmycin (CUBICIN) 600 mg in sodium chloride 0.9 % IVPB  Status:  Discontinued     600 mg 224 mL/hr over 30 Minutes Intravenous Every M-W-F (Hemodialysis) 02/23/16 1625 02/24/16 1517   02/22/16 2200  DAPTOmycin (CUBICIN) 600 mg in sodium chloride 0.9 % IVPB  Status:  Discontinued     600 mg 224 mL/hr over 30 Minutes Intravenous Every T-Th-Sa (Hemodialysis) 02/21/16 1324 02/23/16 1625   02/21/16 2200  DAPTOmycin (CUBICIN) 600 mg in sodium chloride 0.9 % IVPB  Status:  Discontinued     600 mg 224 mL/hr over 30 Minutes Intravenous Every M-W-F (Hemodialysis) 02/20/16 1001 02/21/16 1324   02/19/16 0600  DAPTOmycin (CUBICIN) 500 mg in sodium chloride 0.9 % IVPB  Status:  Discontinued     500 mg 220  mL/hr over 30 Minutes Intravenous Every 24 hours 02/18/16 1413 02/20/16 1001   02/15/16 1500  DAPTOmycin (CUBICIN) 676 mg in sodium chloride 0.9 % IVPB  Status:  Discontinued     8 mg/kg  84.5 kg 227  mL/hr over 30 Minutes Intravenous Every 48 hours 02/15/16 1407 02/18/16 1413   02/15/16 1400  DAPTOmycin (CUBICIN) 676 mg in sodium chloride 0.9 % IVPB  Status:  Discontinued     8 mg/kg  84.5 kg 227 mL/hr over 30 Minutes Intravenous Every 24 hours 02/15/16 1353 02/15/16 1406   02/14/16 0215  vancomycin (VANCOCIN) IVPB 1000 mg/200 mL premix  Status:  Discontinued     1,000 mg 200 mL/hr over 60 Minutes Intravenous  Once 02/14/16 0212 02/14/16 0243   02/14/16 0145  cefTRIAXone (ROCEPHIN) 1 g in dextrose 5 % 50 mL IVPB     1 g 100 mL/hr over 30 Minutes Intravenous  Once 02/14/16 0144 02/14/16 0232        Objective:   Vitals:   02/24/16 1759 02/24/16 2109 02/25/16 0530 02/25/16 0941  BP: (!) 120/55 (!) 121/40 (!) 119/59 (!) 122/49  Pulse: 85 82 83 72  Resp: 20 19 18 17   Temp: 98 F (36.7 C) 99.1 F (37.3 C) 99 F (37.2 C) 98.3 F (36.8 C)  TempSrc: Oral Oral Oral Oral  SpO2: 98% 97% 97% 97%  Weight:  77.6 kg (171 lb 1.2 oz)    Height:        Wt Readings from Last 3 Encounters:  02/24/16 77.6 kg (171 lb 1.2 oz)  02/13/16 85 kg (187 lb 6.4 oz)  02/06/16 79.9 kg (176 lb 3.2 oz)     Intake/Output Summary (Last 24 hours) at 02/25/16 1415 Last data filed at 02/25/16 1120  Gross per 24 hour  Intake              480 ml  Output              250 ml  Net              230 ml     Physical Exam Gen: Elderly male frail, underweight and chronically ill; afebrile and in no distress. Patient feeling better overall. Had HD on 10/23; tolerated treatment w/o problems. No complaining of CP or SOB.  HEENT: moist mucosa, supple neck, no JVD appreciated  Chest: improved lung breath sounds b/l, no frank crackles or wheezing  CVS: N S1&S2, no murmurs, rubs or gallop GI: soft, NT, ND, BS+,  chronic Foley Musculoskeletal: warm, sacral decubitus ulcer appreciated, positive serosanguineous drainage  CNS: Alert and oriented, paraplegic, able to follow commands.    Data Review:    CBC  Recent Labs Lab 02/19/16 0358 02/19/16 0445 02/22/16 0742 02/24/16 0715  WBC 9.6 9.5 10.1 7.5  HGB 9.3* 9.2* 8.2* 7.8*  HCT 27.5* 27.6* 25.5* 24.0*  PLT 243 237 250 239  MCV 90.2 90.8 93.1 92.7  MCH 30.5 30.3 29.9 30.1  MCHC 33.8 33.3 32.2 32.5  RDW 17.5* 17.6* 18.3* 18.2*  LYMPHSABS 2.5 2.5  --   --   MONOABS 1.1* 1.2*  --   --   EOSABS 0.0 0.0  --   --   BASOSABS 0.0 0.0  --   --     Chemistries   Recent Labs Lab 02/19/16 0445  02/20/16 0420 02/21/16 1119 02/22/16 0742 02/22/16 0743 02/23/16 0630 02/24/16 0715 02/25/16 0420  NA 138  < > 136 134*  --  137 133* 134* 134*  K 4.5  < > 4.3 4.5  --  4.1 3.9 4.2 4.9  CL 103  < > 102 100*  --  101 97* 97* 97*  CO2 27  < > 24 24  --  26 25 26 27   GLUCOSE 104*  < > 139* 321*  --  151* 104* 176* 120*  BUN 38*  < > 25* 52*  --  54* 35* 55* 29*  CREATININE 1.78*  < > 1.60* 3.79*  --  3.83* 3.51* 4.73* 3.29*  CALCIUM 8.5*  < > 8.6* 8.7*  --  8.8* 8.2* 8.6* 8.4*  MG 2.7*  --  2.6*  --   --   --   --   --   --   ALT  --   --   --   --  15*  --   --   --   --   < > = values in this interval not displayed. ------------------------------------------------------------------------------------------------------------------  Lab Results  Component Value Date   HGBA1C 6.0 (H) 02/14/2016   ------------------------------------------------------------------------------------------------------------------ No results for input(s): TSH, T4TOTAL, T3FREE, THYROIDAB in the last 72 hours.  Invalid input(s): FREET3 ------------------------------------------------------------------------------------------------------------------  Cardiac Enzymes No results for input(s): CKMB, TROPONINI, MYOGLOBIN in the last 168 hours.  Invalid input(s):  CK ------------------------------------------------------------------------------------------------------------------    Component Value Date/Time   BNP 927.0 (H) 02/14/2016 0020    Inpatient Medications  Scheduled Meds: . aspirin  81 mg Oral Daily  . carvedilol  6.25 mg Oral BID WC  . collagenase   Topical Daily  . [START ON 02/26/2016] DAPTOmycin (CUBICIN)  IV  600 mg Intravenous Q M,W,F-1800  . feeding supplement (NEPRO CARB STEADY)  237 mL Oral TID BM  . heparin subcutaneous  5,000 Units Subcutaneous Q8H  . insulin aspart  0-5 Units Subcutaneous QHS  . insulin aspart  0-9 Units Subcutaneous TID WC  . insulin detemir  10 Units Subcutaneous Daily  . mouth rinse  15 mL Mouth Rinse q12n4p  . multivitamin with minerals  1 tablet Oral Daily  . oxybutynin  5 mg Oral BID   Continuous Infusions:   PRN Meds:.acetaminophen **OR** acetaminophen, ALPRAZolam, bisacodyl, HYDROcodone-acetaminophen, ondansetron **OR** ondansetron (ZOFRAN) IV, polyethylene glycol  Micro Results No results found for this or any previous visit (from the past 240 hour(s)).  Radiology Reports Dg Chest 2 View  Result Date: 02/14/2016 CLINICAL DATA:  Acute onset of wheezing and shortness of breath. Initial encounter. EXAM: CHEST  2 VIEW COMPARISON:  Chest radiograph performed 08/30/2014 FINDINGS: Vascular congestion is noted, with bilateral central airspace opacification and small bilateral pleural effusions, compatible with pulmonary edema. No pneumothorax is seen. The cardiomediastinal silhouette is borderline normal in size. No acute osseous abnormalities are identified. IMPRESSION: Vascular congestion, with bilateral central airspace opacification and small bilateral pleural effusions, compatible with pulmonary edema. Electronically Signed   By: Garald Balding M.D.   On: 02/14/2016 01:35   US Renal  Result Date: 02/14/2016 CLINICAL DATA:  Acute renal failure. EXAM: RENAL / URINARY TRACT ULTRASOUND COMPLETE  COMPARISON:  Renal ultrasound of March 30, 2011. FINDINGS: Right Kidney: Length: 11.2 cm. Two simple cysts are seen in the upper pole, with the largest measuring 2.3 cm. Increased echogenicity of renal parenchyma is noted consistent with medical renal disease. No mass or hydronephrosis visualized. Left Kidney: Length: 10.7 cm. Increased echogenicity of renal parenchyma is noted consistent with medical renal disease. No mass or hydronephrosis visualized. Bladder: Decompressed secondary to Foley catheter. IMPRESSION: Simple right renal cyst. Increased echogenicity of renal parenchyma bilaterally is noted consistent with medical renal disease. Electronically Signed   By: Marijo Conception, M.D.   On: 02/14/2016 08:36   Mr Sacrum/si Joints Wo Contrast  Result Date: 02/14/2016 CLINICAL  DATA:  Right ischial pressure ulcer since September of this year. Wound culture from 01/30/2016 grew MRSA. EXAM: MR SACRUM WITHOUT CONTRAST TECHNIQUE: Multiplanar, multisequence MR imaging was performed. No intravenous contrast was administered. COMPARISON:  MRI of the pelvis 09/01/2014. FINDINGS: There is partial visualization of marrow edema in the left parasymphyseal pubic bone, unchanged. No fracture is identified. A large ulceration over the right ischial tuberosity extends to bone. The wound appears to be packed. Mildly increased T2 signal and thickening are seen about the margins of the wound which could be due to infection or granulation tissue. A small focus of marrow edema is seen subjacent to the ulcer in the ischium compatible with osteomyelitis. No soft tissue abscess is identified. An ulceration is also seen over the left ischial tuberosity which may be healed. Marked edema is seen in subcutaneous soft tissues over the right greater trochanter. Small focus of mild marrow edema is seen in the underlying greater trochanter. Bone marrow signal is normal in the sacrum. There is no SI joint effusion. Small amount of fluid is  present in the hip joints bilaterally most compatible with physiologic change. No muscle or tendon tear is seen. Imaged intrapelvic contents demonstrated a Foley catheter in place. The urinary bladder is decompressed. Its walls appear markedly thickened. IMPRESSION: Large ulceration over the right ischial tuberosity extends to bone. Mild marrow edema is seen in the subjacent ischial tuberosity compatible with osteomyelitis. Subcutaneous edema over the right greater trochanter may be due to cellulitis. Small focus of mild marrow edema in the greater trochanter is worrisome for osteomyelitis. The urinary bladder is nearly completely decompressed but its walls appear markedly thickened which may be due to chronic bladder outlet obstruction and/or cystitis. Electronically Signed   By: Inge Rise M.D.   On: 02/14/2016 16:03   Dg Chest Port 1 View  Result Date: 02/19/2016 CLINICAL DATA:  Pulmonary edema. EXAM: PORTABLE CHEST 1 VIEW COMPARISON:  02/18/2016. FINDINGS: Heart size normal. Persistent but improving bilateral pulmonary infiltrates/edema. Low lung volumes. Persistent pleural effusions, left side greater than right . No pneumothorax. Degenerative changes thoracic spine . IMPRESSION: Persistent but improving bilateral pulmonary infiltrates/edema. Low lung volumes. Persistent pleural effusions, left side greater than right. Electronically Signed   By: Marcello Moores  Register   On: 02/19/2016 07:26   Dg Chest Port 1 View  Result Date: 02/18/2016 CLINICAL DATA:  Acute respiratory distress.  Aspiration. EXAM: PORTABLE CHEST 1 VIEW COMPARISON:  02/18/2016 at 0754 hours FINDINGS: The cardiomediastinal silhouette is unchanged. Pulmonary vascular congestion and diffuse interstitial prominence, slightly increased from prior. Left mid lung and left greater than right basilar airspace opacities have mildly progressed. Small left pleural effusion may have mildly enlarged. No pneumothorax identified. IMPRESSION: Slight  interval worsening of left greater than right lung opacities suggestive of edema, although superimposed infection or aspiration is not excluded. Slightly increased size of small left pleural effusion. Electronically Signed   By: Logan Bores M.D.   On: 02/18/2016 11:15   Dg Chest Port 1 View  Result Date: 02/18/2016 CLINICAL DATA:  Acute respiratory failure.  CHF EXAM: PORTABLE CHEST 1 VIEW COMPARISON:  02/17/2016 FINDINGS: Improvement in bilateral airspace disease compatible with pulmonary edema. There remains mild-to-moderate bilateral edema. Small bilateral effusions and bibasilar atelectasis have improved. IMPRESSION: Improving congestive heart failure with edema and bilateral effusions. Bibasilar consolidation has improved. Electronically Signed   By: Franchot Gallo M.D.   On: 02/18/2016 09:23   Dg Chest Port 1 View  Result Date: 02/17/2016 CLINICAL  DATA:  Acute congestive heart failure. Acute renal failure. Worsening shortness of breath and hypoxia. EXAM: PORTABLE CHEST 1 VIEW COMPARISON:  02/14/2016 FINDINGS: Worsening diffuse symmetric bilateral airspace disease is seen, most likely due to worsening pulmonary edema. Bilateral pleural effusions are again noted. No pneumothorax visualized. Heart size is stable. IMPRESSION: Worsening diffuse symmetric airspace disease, most likely due ago pulmonary edema. Bilateral pleural effusions again noted. Electronically Signed   By: Earle Gell M.D.   On: 02/17/2016 14:15   Dg Hip Unilat With Pelvis 2-3 Views Right  Result Date: 01/30/2016 CLINICAL DATA:  75 year old with approximate 6 week history of a decubitus ulcer involving the right buttock, with recent worsening of the infection. EXAM: DG HIP (WITH OR WITHOUT PELVIS) 2-3V RIGHT COMPARISON:  Right hip x-rays 05/28/2015.  MRI pelvis 09/01/2014. FINDINGS: No evidence of acute fracture or dislocation. No evidence of osteomyelitis. No visible joint effusion. Moderate joint space narrowing. Bone mineral  density well-preserved. Included AP pelvis again shows the healed osteomyelitis with new bone formation at the left ischium. Contralateral left hip joint with symmetric moderate joint space narrowing. Sacroiliac joints and symphysis pubis intact. Mild degenerative changes involving the visualized lower lumbar spine. Penile prosthesis again noted. IMPRESSION: No acute osseous abnormality. No radiographic evidence of acute osteomyelitis. Electronically Signed   By: Evangeline Dakin M.D.   On: 01/30/2016 20:36    Time Spent in minutes  25 minutes   Barton Dubois M.D on 02/25/2016 at 2:15 PM  Between 7am to 7pm - Pager - [phone] After 7pm go to [email or web address] - password [facility]  [facility] -  Office  [phone]

## 2016-02-26 DIAGNOSIS — N185 Chronic kidney disease, stage 5: Secondary | ICD-10-CM

## 2016-02-26 LAB — CBC
HEMATOCRIT: 22.3 % — AB (ref 39.0–52.0)
HEMOGLOBIN: 7.2 g/dL — AB (ref 13.0–17.0)
MCH: 30.5 pg (ref 26.0–34.0)
MCHC: 32.3 g/dL (ref 30.0–36.0)
MCV: 94.5 fL (ref 78.0–100.0)
Platelets: 223 10*3/uL (ref 150–400)
RBC: 2.36 MIL/uL — AB (ref 4.22–5.81)
RDW: 18.1 % — ABNORMAL HIGH (ref 11.5–15.5)
WBC: 7 10*3/uL (ref 4.0–10.5)

## 2016-02-26 LAB — GLUCOSE, CAPILLARY
GLUCOSE-CAPILLARY: 91 mg/dL (ref 65–99)
GLUCOSE-CAPILLARY: 153 mg/dL — AB (ref 65–99)
Glucose-Capillary: 151 mg/dL — ABNORMAL HIGH (ref 65–99)
Glucose-Capillary: 181 mg/dL — ABNORMAL HIGH (ref 65–99)

## 2016-02-26 LAB — RENAL FUNCTION PANEL
ALBUMIN: 2.1 g/dL — AB (ref 3.5–5.0)
ANION GAP: 12 (ref 5–15)
BUN: 50 mg/dL — ABNORMAL HIGH (ref 6–20)
CALCIUM: 8.6 mg/dL — AB (ref 8.9–10.3)
CO2: 25 mmol/L (ref 22–32)
Chloride: 95 mmol/L — ABNORMAL LOW (ref 101–111)
Creatinine, Ser: 4.67 mg/dL — ABNORMAL HIGH (ref 0.61–1.24)
GFR calc Af Amer: 13 mL/min — ABNORMAL LOW (ref >60)
GFR calc non Af Amer: 11 mL/min — ABNORMAL LOW (ref >60)
GLUCOSE: 167 mg/dL — AB (ref 65–99)
PHOSPHORUS: 5.5 mg/dL — AB (ref 2.5–4.6)
Potassium: 5.5 mmol/L — ABNORMAL HIGH (ref 3.5–5.1)
SODIUM: 132 mmol/L — AB (ref 135–145)

## 2016-02-26 MED ORDER — HEPARIN SODIUM (PORCINE) 1000 UNIT/ML DIALYSIS: 20.0000 [IU]/kg | INTRAMUSCULAR | Status: DC | PRN

## 2016-02-26 MED ORDER — DEXTROSE 5 % IV SOLN
1.5000 g | INTRAVENOUS | Status: AC
  Administered 2016-02-27: 1.5 g via INTRAVENOUS
  Filled 2016-02-26 (×2): qty 1.5

## 2016-02-26 NOTE — Care Management Important Message (Signed)
Important Message  Patient Details  Name: BRISON FIUMARA MRN: [id number] Date of Birth: [birth year]   Medicare Important Message Given:  Yes    Reeya Bound, Rory Percy, RN 02/26/2016, 12:58 PM

## 2016-02-26 NOTE — Progress Notes (Signed)
Admit: 02/13/2016 LOS: 12  42M AoCKD3 (BL 1.7-1.8) after admit with pulm edema, pelvic OM on TMP/SMX; req RRT (CRRT-->iHD)  Subjective:  No new events Little UOP Feels well BUn/SCr and K all worsened  10/24 0701 - 10/25 0700 In: 1320 [P.O.:1080; NG/GT:240] Out: 275 [Urine:275]  Filed Weights   02/24/16 1055 02/24/16 2109 02/25/16 2135  Weight: 73.8 kg (162 lb 11.2 oz) 77.6 kg (171 lb 1.2 oz) 77.5 kg (170 lb 13.7 oz)    Scheduled Meds: . aspirin  81 mg Oral Daily  . carvedilol  6.25 mg Oral BID WC  . collagenase   Topical Daily  . DAPTOmycin (CUBICIN)  IV  600 mg Intravenous Q M,W,F-1800  . feeding supplement (NEPRO CARB STEADY)  237 mL Oral TID BM  . heparin subcutaneous  5,000 Units Subcutaneous Q8H  . insulin aspart  0-5 Units Subcutaneous QHS  . insulin aspart  0-9 Units Subcutaneous TID WC  . insulin detemir  10 Units Subcutaneous Daily  . mouth rinse  15 mL Mouth Rinse q12n4p  . multivitamin with minerals  1 tablet Oral Daily  . oxybutynin  5 mg Oral BID   Continuous Infusions:  PRN Meds:.acetaminophen **OR** acetaminophen, ALPRAZolam, bisacodyl, HYDROcodone-acetaminophen, ondansetron **OR** ondansetron (ZOFRAN) IV, polyethylene glycol  Current Labs: reviewed   Physical Exam:  Blood pressure 125/61, pulse 77, temperature 98.3 F (36.8 C), temperature source Oral, resp. rate 18, height 6\' 4"  (1.93 m), weight 77.5 kg (170 lb 13.7 oz), SpO2 95 %. NAD RRR No edema CTAB AAOx3 S/nd/nd  A 1. AoCKD3 (BL 1.7-1.8), dialysis dependent, using temp Fem HD cath 1. Etiology: TMP/SMX, Cardiorenal, IC mediated with OM, ? vanc 2. No evidnce of recovery to date 2. Ischial OM on daptomycin 3. Pulm Edema acute systolic CHF, resolved 4. MS, wheelchair depdendent 5. HTN 6. Spastic Quadriplegia 7. DM2 8. Anemia  P 1. HD today, no evidence of sufficient GFR; 2K, 3.5h, 1L UF 2. Remove Fem Temp HD cath post HD 3. C/s VVS For TDC, hopefully tomorrow 1026 4. Might need acute  outpt HD 5. Daily weights, Daily Renal Panel, Strict I/Os, Avoid nephrotoxins (NSAIDs, judicious IV Contrast)    Pearson Grippe MD 02/26/2016, 11:21 AM   Recent Labs Lab 02/24/16 0715 02/25/16 0420 02/26/16 0511  NA 134* 134* 132*  K 4.2 4.9 5.5*  CL 97* 97* 95*  CO2 26 27 25   GLUCOSE 176* 120* 167*  BUN 55* 29* 50*  CREATININE 4.73* 3.29* 4.67*  CALCIUM 8.6* 8.4* 8.6*  PHOS 6.3* 4.2 5.5*    Recent Labs Lab 02/22/16 0742 02/24/16 0715  WBC 10.1 7.5  HGB 8.2* 7.8*  HCT 25.5* 24.0*  MCV 93.1 92.7  PLT 250 239

## 2016-02-26 NOTE — Progress Notes (Signed)
Nutrition Follow-up  DOCUMENTATION CODES:   Not applicable  INTERVENTION:  Continue Nepro Shake po TID, each supplement provides 425 kcal and 19 grams protein.  Encourage adequate PO intake.   NUTRITION DIAGNOSIS:   Inadequate oral intake related to poor appetite, nausea as evidenced by per patient/family report; improving  GOAL:   Patient will meet greater than or equal to 90% of their needs; met  MONITOR:   PO intake, Supplement acceptance, Labs, Weight trends, I & O's, Skin  REASON FOR ASSESSMENT:   Malnutrition Screening Tool, Consult Assessment of nutrition requirement/status  ASSESSMENT:   75 y.o. male with a past medical history significant for MS with quadruplegia, chronic decubitus ulcer, anemia, CKDIII baseline Cr 1.7-1.8, HTN, IDDM and recurrent UTI with indwelling foley catheter who presents with dyspnea.   Meal completion mostly 75-100% at meals. Pt reports appetite and po intake has been improving. Pt currently has Nepro Shake ordered and has been consuming them. RD to continue with current orders. Plans for tunneled dialysis catheter placement tomorrow.   Labs and medications reviewed. Potassium elevated at 5.5. Phosphorous elevated at 5.5.  Diet Order:  Diet renal/carb modified with fluid restriction Diet-HS Snack? Nothing; Room service appropriate? Yes; Fluid consistency: Thin Diet NPO time specified Except for: Sips with Meds  Skin:  Wound (see comment) (Unstageble to buttocks)  Last BM:  10/24  Height:   Ht Readings from Last 1 Encounters:  02/20/16 _0  (1.93 m)    Weight:   Wt Readings from Last 1 Encounters:  02/25/16 170 lb 13.7 oz (77.5 kg)    Ideal Body Weight:  91.82 kg  BMI:  Body mass index is 20.8 kg/m.  Estimated Nutritional Needs:   Kcal:  2050-2250  Protein:  100-115 grams  Fluid:  1.2 L/day  EDUCATION NEEDS:   Education needs addressed  Corrin Parker, MS, RD, LDN Pager # [phone] After hours/ weekend pager #  [phone]

## 2016-02-26 NOTE — Consult Note (Signed)
[facility] [place]  Patient name: Lance [name] MRN: [id number] [name]: [birth year] Sex: male  REASON FOR CONSULT: dialysis catheter, consult is from Dr. Joelyn Oms.   HPI: Lance [name] is a 75 y.o. male with AKI on CKD3.  We are being asked to place a TDC. He was admitted secondary to new onset dyspnea andhypoxemia. CXR in the ED showed pulmonary edema, creatinine was noted to be elevated at 3.3 and he was admitted. BNP elevated >900, troponins elevated. ECHO shows LV EF 35% - 40% (down from 50-55% 09/2014).   A right femoral vein 20 cm Trialysis temp HD catheter was placed.  He has been on CRRT.    PMH significant for DM2, HTN, multiple sclerosis, spastic quadriplegia, urinary retention, h/o decubitus ulcers/ischial osteo currently being treated with daptomycin  Left(2016 with MRSA bacteremia treated with 7 weeks of vancomycin).      Past Medical History:  Diagnosis Date  . Acute CHF (congestive heart failure) ([place]) 02/14/2016  . Acute osteomyelitis, pelvis, right ([place])   . Acute renal failure ([place])   . Anemia in chronic renal disease   . Blood transfusion   . Cardiomyopathy ([place]) 02/17/2016  . Chronic spastic paraplegia ([facility])    secondary to MS  . CKD (chronic kidney disease), stage III   . Decreased sensation of lower extremity    due to MS  . Decubitus ulcer of ischium   . Decubitus ulcer of trochanter, stage 4 ([place]) 06/14/2015  . History of MRSA infection    urine  . History of recurrent UTIs   . Hypertension   . MS (multiple sclerosis) ([place])   . Neuralgia neuritis, sciatic nerve    MS for 30 years  . Neurogenic bladder   . PONV (postoperative nausea and vomiting)   . Self-catheterizes urinary bladder   . Type 2 diabetes mellitus (H[facility])     Family History  Problem Relation Age of Onset  . Stroke Father   . Diabetes Father   . Diabetes Paternal Grandmother   . Stroke Paternal Grandfather   . Heart disease Neg Hx   . Cancer Neg Hx      SOCIAL HISTORY: Social History   Social History  . Marital status: Married    Spouse name: N/A  . Number of children: N/A  . Years of education: N/A   Occupational History  . Not on file.   Social History Main Topics  . Smoking status: Former Smoker    Types: Cigars    Quit date: 05/06/1983  . Smokeless tobacco: Never Used  . Alcohol use 1.8 oz/week    3 Glasses of wine per week     Comment: 3 glasses of wine per week  . Drug use: No  . Sexual activity: Not Currently    Birth control/ protection: None   Other Topics Concern  . Not on file   Social History Narrative  . No narrative on file    No Known Allergies  Current Facility-Administered Medications  Medication Dose Route Frequency Provider Last Rate Last Dose  . acetaminophen (TYLENOL) tablet 650 mg  650 mg Oral Q6H PRN Edwin Dada, MD   650 mg at 02/17/16 1142   Or  . acetaminophen (TYLENOL) suppository 650 mg  650 mg Rectal Q6H PRN Edwin Dada, MD      . ALPRAZolam Duanne Moron) tablet 0.25 mg  0.25 mg Oral TID PRN Eileen Stanford, PA-C   0.25 mg at 02/17/16 1139  .  aspirin chewable tablet 81 mg  81 mg Oral Daily Berton Mount, RPH   81 mg at 02/26/16 1009  . bisacodyl (DULCOLAX) suppository 10 mg  10 mg Rectal Daily PRN Edwin Dada, MD      . carvedilol (COREG) tablet 6.25 mg  6.25 mg Oral BID WC Roney Jaffe, MD   6.25 mg at 02/26/16 1009  . collagenase (SANTYL) ointment   Topical Daily Edwin Dada, MD      . DAPTOmycin (CUBICIN) 600 mg in sodium chloride 0.9 % IVPB  600 mg Intravenous Q M,W,F-1800 Rolla Flatten, [facility]      . feeding supplement (NEPRO CARB STEADY) liquid 237 mL  237 mL Oral TID BM Barton Dubois, MD   237 mL at 02/26/16 1011  . heparin injection 5,000 Units  5,000 Units Subcutaneous Q8H Nishant Dhungel, MD   5,000 Units at 02/26/16 0559  . HYDROcodone-acetaminophen (NORCO/VICODIN) 5-325 MG per tablet 1-2 tablet  1-2 tablet Oral Q4H PRN Edwin Dada, MD      . insulin aspart (novoLOG) injection 0-5 Units  0-5 Units Subcutaneous QHS Edwin Dada, MD   0 Units at 02/23/16 2138  . insulin aspart (novoLOG) injection 0-9 Units  0-9 Units Subcutaneous TID WC Edwin Dada, MD   2 Units at 02/26/16 1008  . insulin detemir (LEVEMIR) injection 10 Units  10 Units Subcutaneous Daily Edwin Dada, MD   10 Units at 02/26/16 1121  . MEDLINE mouth rinse  15 mL Mouth Rinse q12n4p Edwin Dada, MD   15 mL at 02/26/16 1200  . multivitamin with minerals tablet 1 tablet  1 tablet Oral Daily [place] Marisa Hua, MD   1 tablet at 02/26/16 1009  . ondansetron (ZOFRAN) tablet 4 mg  4 mg Oral Q6H PRN Edwin Dada, MD       Or  . ondansetron (ZOFRAN) injection 4 mg  4 mg Intravenous Q6H PRN Edwin Dada, MD   4 mg at 02/17/16 1203  . oxybutynin (DITROPAN) tablet 5 mg  5 mg Oral BID Edwin Dada, MD   5 mg at 02/26/16 1009  . polyethylene glycol (MIRALAX / GLYCOLAX) packet 17 g  17 g Oral Daily PRN Nishant Dhungel, MD   17 g at 02/25/16 7494    REVIEW OF SYSTEMS:  [X]  denotes positive finding, [ ]  denotes negative finding Cardiac  Comments:  Chest pain or chest pressure:    Shortness of breath upon exertion:    Short of breath when lying flat:    Irregular heart rhythm:        Vascular    Pain in calf, thigh, or hip brought on by ambulation:    Pain in feet at night that wakes you up from your sleep:     Blood clot in your veins:    Leg swelling:         Pulmonary    Oxygen at home:    Productive cough:     Wheezing:         Neurologic    Sudden weakness in arms or legs:     Sudden numbness in arms or legs:     Sudden onset of difficulty speaking or slurred speech:    Temporary loss of vision in one eye:     Problems with dizziness:         Gastrointestinal    Blood in stool:     Vomited blood:  Genitourinary    Burning when urinating:     Blood in urine:          Psychiatric    Major depression:         Hematologic    Bleeding problems:    Problems with blood clotting too easily:        Skin    Rashes or ulcers:        Constitutional    Fever or chills:      PHYSICAL EXAM: Vitals:   02/25/16 1754 02/25/16 2135 02/26/16 0617 02/26/16 0938  BP: (!) 125/58 (!) 122/51 (!) 147/59 125/61  Pulse: 81 80 81 77  Resp: 17 17 18 18   Temp: 98 F (36.7 C) 98.6 F (37 C) 98.3 F (36.8 C) 98.3 F (36.8 C)  TempSrc: Oral Oral Oral Oral  SpO2: 100% 98% 97% 95%  Weight:  170 lb 13.7 oz (77.5 kg)    Height:        GENERAL: The patient is alert and oriented male, in no acute distress. The patient is seen in HD.The vital signs are documented above. VASCULAR: 2+ radial pulses and 2+ dorsalis pedis pulses bilaterally. Right femoral dialysis catheter PULMONARY: Non labored respiratory effort.  MUSCULOSKELETAL: Atrophy bilateral lower extremities.  NEUROLOGIC: No focal weakness or paresthesias are detected. SKIN: There are no ulcers or rashes noted. PSYCHIATRIC: The patient has a normal affect.  MEDICAL ISSUES: Acute on chronic kidney disease stage 3  The patient is currently dialyzing via a right femoral temporary catheter. Plan for tunneled dialysis catheter placement tomorrow with Dr. Bridgett Larsson.    Virgina Jock, PA-C [facility] [place] [phone]  I have independently interviewed patient and agree with PA assessment and plan above. [facility] tomorrow. If he will need long term dialysis will need to get vein mapping as well. NPO at midnight. Discussed risks and benefits with patient and he agrees to proceed with one of my partners tomorrow.   Brandon C. Donzetta Matters, MD [facility] [place] Office: [phone] Pager: [phone]

## 2016-02-26 NOTE — Progress Notes (Signed)
PROGRESS NOTE                                              Chief Complaint  Patient presents with  . Respiratory Distress       Brief Narrative   1 ? multiple sclerosis,  spastic quadriplegia since ~ 2005,  chronic Foley for urinary retention on suppressive Bactrim chronic kidney disease stage III,  diabetes mellitus ty II ,  hypertension,  history of decubitus ulcers with history of ischial osteomyelitis-s/p L post thigh MSK V-Y advancement flap closure L Isch ulcer + R fasciocut rotation flap advancement [follows with Dr. Neysa Hotter [facility]]  Recently hospitalized (9/29-10/4) with R ischial pressure ulcer, that was debrided, treated with 6 days of vanco and zosyn (for this plus a klebsiella and strep UTI) Discharged  10/4 (with a creatinine of 1.82)to finish 4 more days of bactrim. [facility] with new onset dyspnea, andhypoxemia. CXR in the ED showed pulmonary edema, creatinine was noted to be elevated at 3.3 and he was admitted. BNP elevated >900, troponins elevated. ECHO LV EF 35% - 40% (down from 50-55% 09/2014), moderate diffuse hypokinesis, mild to moderate MR and Grade 1 diastolic dysfunction. Patient found to have acute cardiomyopathy with acute respiratory failure requiring BiPAP and transferred to ICU. He also developed worsened renal function concerning for cardiorenal syndrome now requiring CRRT.    Subjective:   Seen on HD Doing fair No n/v/cp or SOB Asking about dispo   Assessment  & Plan :    Principal Problem:   Acute hypoxic respiratory failure ([place]) Secondary to acute cardiomyopathy with pulmonary edema. now requiring CRRT. Wean oxygen to room air. Pulmonary edema with transudative fluids on CXR, improving with CRRT. Pulmonary was consulted and Recommended volume management with CRRT. Condition improved, no longer SOB and with good O2 sat on  RA -now managing volume with intermittent HD.  Active Problems: Acute systolic CHF/nonischemic cardiomyopathy -EF of 35-40% as per echo. Elevated troponin likely due to demand ischemia. No wall motion abnormalities. -On Coreg. Lasix discontinued due to worsening renal function. Not on ACEi/ ARB due to renal failure. -continue statins and ASA -Plan for intermittent HD for volume control in expectancy for kidneys to recover function. -Had HD yesterday 10/23; will follow renal function in am again and follow nephrology rec's for next treatment .  Acute on chronic kidney disease (stage 3-4 at baseline) -Appears baseline creatinine of 1.8. Suspect cardiorenal etiology. ASO titre positive, ? Immune complex glomerulonephritis. -Patient started on CRRT, via rt femoral catheter. Tolerated well and volume status improved.  -[facility]; expectations are for kidney recovery. -Cr 4 range -for more durable access placement prior to d/c -VVS consulted   Decubitus stage IV ulcer with Right ischial osteomyelitis -As suggested on MRI. Patient being followed by [facility] at [facility] for possible muscle flap surgery at some point. -Prior culture from ischial wounds grew MRSA and enterococcus. Started on 6 weeks course of IV daptomycin by ID recommendations. -no fever and non-toxic appearance currently  -last antibiotic dose anticipated Mar 30, 2016  Severe protein calorie  malnutrition -continue feeding supplement. -nutritonal service consulted, will follow rec's  DM (diabetes mellitus) type II controlled with renal manifestation ([place]) -Currently on reduced dose Levemir 10 units at bedtime with sliding scale coverage. -Last A1c of 6.1. -will follow CBG's-sugars 151-181  Multiple sclerosis ([facility]) -With spastic paraplegia diagnosed in mid 30s. -limited mobility and essentially wheelchair  Bound   Normocytic anemia -At baseline. -IV iron and aranesp as per  renal discretion -no signs of acute GI bleed -hgb 7.8 last checked on 10/23  Chronic Foley catheter  -history of recurrent UTI.  -On suppressive trimethoprim at home.  -Continue oxybutynin. -foley in place  Code Status : Full code  Family Communication  : wife and sister at bedside 10/24  Disposition Plan  :remains inpatient; will follow rec's from renal service   Barriers For Discharge :receiving HD intermittently; kidneys still not recovered and volume status still been adjusted through HD. potential d/c to [facility] as might have bed availability  Consults  :   Renal Cardiology Pulmonary ID  Procedures  :  2-D echo CRRT Now receiving intermittent HD MRI sacrum  DVT Prophylaxis  :  SCDs  Lab Results  Component Value Date   PLT 239 02/24/2016    Antibiotics  :   Anti-infectives    Start     Dose/Rate Route Frequency Ordered Stop   02/26/16 1800  DAPTOmycin (CUBICIN) 600 mg in sodium chloride 0.9 % IVPB     600 mg 224 mL/hr over 30 Minutes Intravenous Every M-W-F (1800) 02/24/16 1517     02/24/16 1200  DAPTOmycin (CUBICIN) 600 mg in sodium chloride 0.9 % IVPB  Status:  Discontinued     600 mg 224 mL/hr over 30 Minutes Intravenous Every M-W-F (Hemodialysis) 02/23/16 1625 02/24/16 1517   02/22/16 2200  DAPTOmycin (CUBICIN) 600 mg in sodium chloride 0.9 % IVPB  Status:  Discontinued     600 mg 224 mL/hr over 30 Minutes Intravenous Every T-Th-Sa (Hemodialysis) 02/21/16 1324 02/23/16 1625   02/21/16 2200  DAPTOmycin (CUBICIN) 600 mg in sodium chloride 0.9 % IVPB  Status:  Discontinued     600 mg 224 mL/hr over 30 Minutes Intravenous Every M-W-F (Hemodialysis) 02/20/16 1001 02/21/16 1324   02/19/16 0600  DAPTOmycin (CUBICIN) 500 mg in sodium chloride 0.9 % IVPB  Status:  Discontinued     500 mg 220 mL/hr over 30 Minutes Intravenous Every 24 hours 02/18/16 1413 02/20/16 1001   02/15/16 1500  DAPTOmycin (CUBICIN) 676 mg in sodium chloride 0.9 % IVPB  Status:  Discontinued      8 mg/kg  84.5 kg 227 mL/hr over 30 Minutes Intravenous Every 48 hours 02/15/16 1407 02/18/16 1413   02/15/16 1400  DAPTOmycin (CUBICIN) 676 mg in sodium chloride 0.9 % IVPB  Status:  Discontinued     8 mg/kg  84.5 kg 227 mL/hr over 30 Minutes Intravenous Every 24 hours 02/15/16 1353 02/15/16 1406   02/14/16 0215  vancomycin (VANCOCIN) IVPB 1000 mg/200 mL premix  Status:  Discontinued     1,000 mg 200 mL/hr over 60 Minutes Intravenous  Once 02/14/16 0212 02/14/16 0243   02/14/16 0145  cefTRIAXone (ROCEPHIN) 1 g in dextrose 5 % 50 mL IVPB     1 g 100 mL/hr over 30 Minutes Intravenous  Once 02/14/16 0144 02/14/16 0232        Objective:   Vitals:   02/25/16 1754 02/25/16 2135 02/26/16 0617 02/26/16 0938  BP: (!) 125/58 (!) 122/51 (!) 147/59 125/61  Pulse:  81 80 81 77  Resp: 17 17 18 18   Temp: 98 F (36.7 C) 98.6 F (37 C) 98.3 F (36.8 C) 98.3 F (36.8 C)  TempSrc: Oral Oral Oral Oral  SpO2: 100% 98% 97% 95%  Weight:  77.5 kg (170 lb 13.7 oz)    Height:        Wt Readings from Last 3 Encounters:  02/25/16 77.5 kg (170 lb 13.7 oz)  02/13/16 85 kg (187 lb 6.4 oz)  02/06/16 79.9 kg (176 lb 3.2 oz)     Intake/Output Summary (Last 24 hours) at 02/26/16 1257 Last data filed at 02/26/16 5726  Gross per 24 hour  Intake             1080 ml  Output              275 ml  Net              805 ml     Physical Exam Gen: Elderly male frail, underweight and chronically ill; afebrile HEENT: moist mucosa, supple neck, no JVD appreciated  Chest: improved lung breath sounds b/l, no wheeze CVS: N S1&S2, no murmurs, rubs or gallop GI: soft, NT, ND, BS+, chronic Foley Musculoskeletal: warm, sacral decubitus ulcer appreciated, positive serosanguineous drainage  CNS: Alert and oriented, paraplegic, able to follow commands.    Data Review:    CBC  Recent Labs Lab 02/22/16 0742 02/24/16 0715  WBC 10.1 7.5  HGB 8.2* 7.8*  HCT 25.5* 24.0*  PLT 250 239  MCV 93.1 92.7  MCH  29.9 30.1  MCHC 32.2 32.5  RDW 18.3* 18.2*    Chemistries   Recent Labs Lab 02/20/16 0420  02/22/16 0742 02/22/16 0743 02/23/16 0630 02/24/16 0715 02/25/16 0420 02/26/16 0511  NA 136  < >  --  137 133* 134* 134* 132*  K 4.3  < >  --  4.1 3.9 4.2 4.9 5.5*  CL 102  < >  --  101 97* 97* 97* 95*  CO2 24  < >  --  26 25 26 27 25   GLUCOSE 139*  < >  --  151* 104* 176* 120* 167*  BUN 25*  < >  --  54* 35* 55* 29* 50*  CREATININE 1.60*  < >  --  3.83* 3.51* 4.73* 3.29* 4.67*  CALCIUM 8.6*  < >  --  8.8* 8.2* 8.6* 8.4* 8.6*  MG 2.6*  --   --   --   --   --   --   --   ALT  --   --  15*  --   --   --   --   --   < > = values in this interval not displayed. ------------------------------------------------------------------------------------------------------------------  Lab Results  Component Value Date   HGBA1C 6.0 (H) 02/14/2016   ------------------------------------------------------------------------------------------------------------------ No results for input(s): TSH, T4TOTAL, T3FREE, THYROIDAB in the last 72 hours.  Invalid input(s): FREET3 ------------------------------------------------------------------------------------------------------------------  Cardiac Enzymes No results for input(s): CKMB, TROPONINI, MYOGLOBIN in the last 168 hours.  Invalid input(s): CK ------------------------------------------------------------------------------------------------------------------    Component Value Date/Time   BNP 927.0 (H) 02/14/2016 0020    Inpatient Medications  Scheduled Meds: . aspirin  81 mg Oral Daily  . carvedilol  6.25 mg Oral BID WC  . collagenase   Topical Daily  . DAPTOmycin (CUBICIN)  IV  600 mg Intravenous Q M,W,F-1800  . feeding supplement (NEPRO CARB STEADY)  237 mL Oral TID BM  . heparin  subcutaneous  5,000 Units Subcutaneous Q8H  . insulin aspart  0-5 Units Subcutaneous QHS  . insulin aspart  0-9 Units Subcutaneous TID WC  . insulin detemir   10 Units Subcutaneous Daily  . mouth rinse  15 mL Mouth Rinse q12n4p  . multivitamin with minerals  1 tablet Oral Daily  . oxybutynin  5 mg Oral BID   Continuous Infusions:   PRN Meds:.acetaminophen **OR** acetaminophen, ALPRAZolam, bisacodyl, HYDROcodone-acetaminophen, ondansetron **OR** ondansetron (ZOFRAN) IV, polyethylene glycol  Micro Results No results found for this or any previous visit (from the past 240 hour(s)).  Radiology Reports Dg Chest 2 View  Result Date: 02/14/2016 CLINICAL DATA:  Acute onset of wheezing and shortness of breath. Initial encounter. EXAM: CHEST  2 VIEW COMPARISON:  Chest radiograph performed 08/30/2014 FINDINGS: Vascular congestion is noted, with bilateral central airspace opacification and small bilateral pleural effusions, compatible with pulmonary edema. No pneumothorax is seen. The cardiomediastinal silhouette is borderline normal in size. No acute osseous abnormalities are identified. IMPRESSION: Vascular congestion, with bilateral central airspace opacification and small bilateral pleural effusions, compatible with pulmonary edema. Electronically Signed   By: Garald Balding M.D.   On: 02/14/2016 01:35   US Renal  Result Date: 02/14/2016 CLINICAL DATA:  Acute renal failure. EXAM: RENAL / URINARY TRACT ULTRASOUND COMPLETE COMPARISON:  Renal ultrasound of March 30, 2011. FINDINGS: Right Kidney: Length: 11.2 cm. Two simple cysts are seen in the upper pole, with the largest measuring 2.3 cm. Increased echogenicity of renal parenchyma is noted consistent with medical renal disease. No mass or hydronephrosis visualized. Left Kidney: Length: 10.7 cm. Increased echogenicity of renal parenchyma is noted consistent with medical renal disease. No mass or hydronephrosis visualized. Bladder: Decompressed secondary to Foley catheter. IMPRESSION: Simple right renal cyst. Increased echogenicity of renal parenchyma bilaterally is noted consistent with medical renal  disease. Electronically Signed   By: Marijo Conception, M.D.   On: 02/14/2016 08:36   Mr Sacrum/si Joints Wo Contrast  Result Date: 02/14/2016 CLINICAL DATA:  Right ischial pressure ulcer since September of this year. Wound culture from 01/30/2016 grew MRSA. EXAM: MR SACRUM WITHOUT CONTRAST TECHNIQUE: Multiplanar, multisequence MR imaging was performed. No intravenous contrast was administered. COMPARISON:  MRI of the pelvis 09/01/2014. FINDINGS: There is partial visualization of marrow edema in the left parasymphyseal pubic bone, unchanged. No fracture is identified. A large ulceration over the right ischial tuberosity extends to bone. The wound appears to be packed. Mildly increased T2 signal and thickening are seen about the margins of the wound which could be due to infection or granulation tissue. A small focus of marrow edema is seen subjacent to the ulcer in the ischium compatible with osteomyelitis. No soft tissue abscess is identified. An ulceration is also seen over the left ischial tuberosity which may be healed. Marked edema is seen in subcutaneous soft tissues over the right greater trochanter. Small focus of mild marrow edema is seen in the underlying greater trochanter. Bone marrow signal is normal in the sacrum. There is no SI joint effusion. Small amount of fluid is present in the hip joints bilaterally most compatible with physiologic change. No muscle or tendon tear is seen. Imaged intrapelvic contents demonstrated a Foley catheter in place. The urinary bladder is decompressed. Its walls appear markedly thickened. IMPRESSION: Large ulceration over the right ischial tuberosity extends to bone. Mild marrow edema is seen in the subjacent ischial tuberosity compatible with osteomyelitis. Subcutaneous edema over the right greater trochanter may be due to cellulitis.  Small focus of mild marrow edema in the greater trochanter is worrisome for osteomyelitis. The urinary bladder is nearly completely  decompressed but its walls appear markedly thickened which may be due to chronic bladder outlet obstruction and/or cystitis. Electronically Signed   By: Inge Rise M.D.   On: 02/14/2016 16:03   Dg Chest Port 1 View  Result Date: 02/19/2016 CLINICAL DATA:  Pulmonary edema. EXAM: PORTABLE CHEST 1 VIEW COMPARISON:  02/18/2016. FINDINGS: Heart size normal. Persistent but improving bilateral pulmonary infiltrates/edema. Low lung volumes. Persistent pleural effusions, left side greater than right . No pneumothorax. Degenerative changes thoracic spine . IMPRESSION: Persistent but improving bilateral pulmonary infiltrates/edema. Low lung volumes. Persistent pleural effusions, left side greater than right. Electronically Signed   By: Marcello Moores  Register   On: 02/19/2016 07:26   Dg Chest Port 1 View  Result Date: 02/18/2016 CLINICAL DATA:  Acute respiratory distress.  Aspiration. EXAM: PORTABLE CHEST 1 VIEW COMPARISON:  02/18/2016 at 0754 hours FINDINGS: The cardiomediastinal silhouette is unchanged. Pulmonary vascular congestion and diffuse interstitial prominence, slightly increased from prior. Left mid lung and left greater than right basilar airspace opacities have mildly progressed. Small left pleural effusion may have mildly enlarged. No pneumothorax identified. IMPRESSION: Slight interval worsening of left greater than right lung opacities suggestive of edema, although superimposed infection or aspiration is not excluded. Slightly increased size of small left pleural effusion. Electronically Signed   By: Logan Bores M.D.   On: 02/18/2016 11:15   Dg Chest Port 1 View  Result Date: 02/18/2016 CLINICAL DATA:  Acute respiratory failure.  CHF EXAM: PORTABLE CHEST 1 VIEW COMPARISON:  02/17/2016 FINDINGS: Improvement in bilateral airspace disease compatible with pulmonary edema. There remains mild-to-moderate bilateral edema. Small bilateral effusions and bibasilar atelectasis have improved. IMPRESSION:  Improving congestive heart failure with edema and bilateral effusions. Bibasilar consolidation has improved. Electronically Signed   By: Franchot Gallo M.D.   On: 02/18/2016 09:23   Dg Chest Port 1 View  Result Date: 02/17/2016 CLINICAL DATA:  Acute congestive heart failure. Acute renal failure. Worsening shortness of breath and hypoxia. EXAM: PORTABLE CHEST 1 VIEW COMPARISON:  02/14/2016 FINDINGS: Worsening diffuse symmetric bilateral airspace disease is seen, most likely due to worsening pulmonary edema. Bilateral pleural effusions are again noted. No pneumothorax visualized. Heart size is stable. IMPRESSION: Worsening diffuse symmetric airspace disease, most likely due ago pulmonary edema. Bilateral pleural effusions again noted. Electronically Signed   By: Earle Gell M.D.   On: 02/17/2016 14:15   Dg Hip Unilat With Pelvis 2-3 Views Right  Result Date: 01/30/2016 CLINICAL DATA:  75 year old with approximate 6 week history of a decubitus ulcer involving the right buttock, with recent worsening of the infection. EXAM: DG HIP (WITH OR WITHOUT PELVIS) 2-3V RIGHT COMPARISON:  Right hip x-rays 05/28/2015.  MRI pelvis 09/01/2014. FINDINGS: No evidence of acute fracture or dislocation. No evidence of osteomyelitis. No visible joint effusion. Moderate joint space narrowing. Bone mineral density well-preserved. Included AP pelvis again shows the healed osteomyelitis with new bone formation at the left ischium. Contralateral left hip joint with symmetric moderate joint space narrowing. Sacroiliac joints and symphysis pubis intact. Mild degenerative changes involving the visualized lower lumbar spine. Penile prosthesis again noted. IMPRESSION: No acute osseous abnormality. No radiographic evidence of acute osteomyelitis. Electronically Signed   By: Evangeline Dakin M.D.   On: 01/30/2016 20:36    Time Spent in minutes  25 minutes   Nita Sells M.D on 02/26/2016 at 12:57 PM  Between 7am  to 7pm -  Pager - [phone] After 7pm go to [email or web address] - password [facility]  [facility] -  Office  [phone]

## 2016-02-27 ENCOUNTER — Inpatient Hospital Stay
Admission: AD | Admit: 2016-02-27 | Discharge: 2016-04-09 | Disposition: A | Discharge status: Skilled Nursing Facility | Payer: Self-pay | Source: Ambulatory Visit | Attending: Internal Medicine | Admitting: Internal Medicine

## 2016-02-27 ENCOUNTER — Encounter (HOSPITAL_COMMUNITY)
Admission: EM | Disposition: A | Discharge status: Other Acute Inpatient Hospital | Payer: Self-pay | Source: Home / Self Care | Attending: Internal Medicine

## 2016-02-27 ENCOUNTER — Inpatient Hospital Stay (HOSPITAL_COMMUNITY): Payer: Medicare Other

## 2016-02-27 ENCOUNTER — Inpatient Hospital Stay (HOSPITAL_COMMUNITY): Payer: Medicare Other | Admitting: Certified Registered"

## 2016-02-27 ENCOUNTER — Encounter (HOSPITAL_COMMUNITY): Payer: Self-pay | Admitting: *Deleted

## 2016-02-27 DIAGNOSIS — G35 Multiple sclerosis: Secondary | ICD-10-CM | POA: Diagnosis not present

## 2016-02-27 DIAGNOSIS — N185 Chronic kidney disease, stage 5: Secondary | ICD-10-CM | POA: Diagnosis not present

## 2016-02-27 DIAGNOSIS — E785 Hyperlipidemia, unspecified: Secondary | ICD-10-CM | POA: Diagnosis not present

## 2016-02-27 DIAGNOSIS — N184 Chronic kidney disease, stage 4 (severe): Secondary | ICD-10-CM | POA: Diagnosis not present

## 2016-02-27 DIAGNOSIS — E1121 Type 2 diabetes mellitus with diabetic nephropathy: Secondary | ICD-10-CM | POA: Diagnosis present

## 2016-02-27 DIAGNOSIS — N2581 Secondary hyperparathyroidism of renal origin: Secondary | ICD-10-CM | POA: Diagnosis not present

## 2016-02-27 DIAGNOSIS — B9562 Methicillin resistant Staphylococcus aureus infection as the cause of diseases classified elsewhere: Secondary | ICD-10-CM | POA: Diagnosis present

## 2016-02-27 DIAGNOSIS — D631 Anemia in chronic kidney disease: Secondary | ICD-10-CM | POA: Diagnosis not present

## 2016-02-27 DIAGNOSIS — N186 End stage renal disease: Secondary | ICD-10-CM | POA: Diagnosis not present

## 2016-02-27 DIAGNOSIS — G8250 Quadriplegia, unspecified: Secondary | ICD-10-CM | POA: Diagnosis not present

## 2016-02-27 DIAGNOSIS — D649 Anemia, unspecified: Secondary | ICD-10-CM | POA: Diagnosis not present

## 2016-02-27 DIAGNOSIS — I5021 Acute systolic (congestive) heart failure: Secondary | ICD-10-CM | POA: Diagnosis not present

## 2016-02-27 DIAGNOSIS — N390 Urinary tract infection, site not specified: Secondary | ICD-10-CM | POA: Diagnosis not present

## 2016-02-27 DIAGNOSIS — B955 Unspecified streptococcus as the cause of diseases classified elsewhere: Secondary | ICD-10-CM | POA: Diagnosis present

## 2016-02-27 DIAGNOSIS — M729 Fibroblastic disorder, unspecified: Secondary | ICD-10-CM | POA: Diagnosis not present

## 2016-02-27 DIAGNOSIS — E1122 Type 2 diabetes mellitus with diabetic chronic kidney disease: Secondary | ICD-10-CM | POA: Diagnosis not present

## 2016-02-27 DIAGNOSIS — I130 Hypertensive heart and chronic kidney disease with heart failure and stage 1 through stage 4 chronic kidney disease, or unspecified chronic kidney disease: Secondary | ICD-10-CM | POA: Diagnosis not present

## 2016-02-27 DIAGNOSIS — E875 Hyperkalemia: Secondary | ICD-10-CM | POA: Diagnosis not present

## 2016-02-27 DIAGNOSIS — E43 Unspecified severe protein-calorie malnutrition: Secondary | ICD-10-CM | POA: Diagnosis present

## 2016-02-27 DIAGNOSIS — I5089 Other heart failure: Secondary | ICD-10-CM | POA: Diagnosis not present

## 2016-02-27 DIAGNOSIS — R0602 Shortness of breath: Secondary | ICD-10-CM | POA: Diagnosis not present

## 2016-02-27 DIAGNOSIS — J9600 Acute respiratory failure, unspecified whether with hypoxia or hypercapnia: Secondary | ICD-10-CM | POA: Diagnosis not present

## 2016-02-27 DIAGNOSIS — I5023 Acute on chronic systolic (congestive) heart failure: Secondary | ICD-10-CM | POA: Diagnosis not present

## 2016-02-27 DIAGNOSIS — L8992 Pressure ulcer of unspecified site, stage 2: Secondary | ICD-10-CM | POA: Diagnosis not present

## 2016-02-27 DIAGNOSIS — M792 Neuralgia and neuritis, unspecified: Secondary | ICD-10-CM | POA: Diagnosis not present

## 2016-02-27 DIAGNOSIS — J9620 Acute and chronic respiratory failure, unspecified whether with hypoxia or hypercapnia: Secondary | ICD-10-CM | POA: Diagnosis not present

## 2016-02-27 DIAGNOSIS — N183 Chronic kidney disease, stage 3 (moderate): Secondary | ICD-10-CM | POA: Diagnosis present

## 2016-02-27 DIAGNOSIS — L89210 Pressure ulcer of right hip, unstageable: Secondary | ICD-10-CM | POA: Diagnosis not present

## 2016-02-27 DIAGNOSIS — M8610 Other acute osteomyelitis, unspecified site: Secondary | ICD-10-CM | POA: Diagnosis not present

## 2016-02-27 DIAGNOSIS — M6281 Muscle weakness (generalized): Secondary | ICD-10-CM | POA: Diagnosis not present

## 2016-02-27 DIAGNOSIS — I4891 Unspecified atrial fibrillation: Secondary | ICD-10-CM | POA: Diagnosis present

## 2016-02-27 DIAGNOSIS — G8220 Paraplegia, unspecified: Secondary | ICD-10-CM | POA: Diagnosis not present

## 2016-02-27 DIAGNOSIS — I129 Hypertensive chronic kidney disease with stage 1 through stage 4 chronic kidney disease, or unspecified chronic kidney disease: Secondary | ICD-10-CM | POA: Diagnosis present

## 2016-02-27 DIAGNOSIS — B999 Unspecified infectious disease: Secondary | ICD-10-CM | POA: Diagnosis not present

## 2016-02-27 DIAGNOSIS — I509 Heart failure, unspecified: Secondary | ICD-10-CM | POA: Diagnosis not present

## 2016-02-27 DIAGNOSIS — I132 Hypertensive heart and chronic kidney disease with heart failure and with stage 5 chronic kidney disease, or end stage renal disease: Secondary | ICD-10-CM | POA: Diagnosis not present

## 2016-02-27 DIAGNOSIS — Z792 Long term (current) use of antibiotics: Secondary | ICD-10-CM | POA: Diagnosis not present

## 2016-02-27 DIAGNOSIS — N059 Unspecified nephritic syndrome with unspecified morphologic changes: Secondary | ICD-10-CM | POA: Diagnosis not present

## 2016-02-27 DIAGNOSIS — N319 Neuromuscular dysfunction of bladder, unspecified: Secondary | ICD-10-CM | POA: Diagnosis present

## 2016-02-27 DIAGNOSIS — A4150 Gram-negative sepsis, unspecified: Secondary | ICD-10-CM | POA: Diagnosis present

## 2016-02-27 DIAGNOSIS — A419 Sepsis, unspecified organism: Secondary | ICD-10-CM | POA: Diagnosis not present

## 2016-02-27 DIAGNOSIS — L89154 Pressure ulcer of sacral region, stage 4: Secondary | ICD-10-CM | POA: Diagnosis not present

## 2016-02-27 DIAGNOSIS — I429 Cardiomyopathy, unspecified: Secondary | ICD-10-CM | POA: Diagnosis present

## 2016-02-27 DIAGNOSIS — N170 Acute kidney failure with tubular necrosis: Secondary | ICD-10-CM | POA: Diagnosis not present

## 2016-02-27 DIAGNOSIS — E119 Type 2 diabetes mellitus without complications: Secondary | ICD-10-CM | POA: Diagnosis not present

## 2016-02-27 DIAGNOSIS — M869 Osteomyelitis, unspecified: Secondary | ICD-10-CM | POA: Diagnosis not present

## 2016-02-27 DIAGNOSIS — D638 Anemia in other chronic diseases classified elsewhere: Secondary | ICD-10-CM | POA: Diagnosis present

## 2016-02-27 DIAGNOSIS — L89314 Pressure ulcer of right buttock, stage 4: Secondary | ICD-10-CM | POA: Diagnosis not present

## 2016-02-27 DIAGNOSIS — Z466 Encounter for fitting and adjustment of urinary device: Secondary | ICD-10-CM | POA: Diagnosis not present

## 2016-02-27 DIAGNOSIS — M868X8 Other osteomyelitis, other site: Secondary | ICD-10-CM | POA: Diagnosis not present

## 2016-02-27 DIAGNOSIS — N179 Acute kidney failure, unspecified: Secondary | ICD-10-CM | POA: Diagnosis present

## 2016-02-27 DIAGNOSIS — Z452 Encounter for adjustment and management of vascular access device: Secondary | ICD-10-CM | POA: Diagnosis not present

## 2016-02-27 DIAGNOSIS — J9621 Acute and chronic respiratory failure with hypoxia: Secondary | ICD-10-CM | POA: Diagnosis present

## 2016-02-27 DIAGNOSIS — J9601 Acute respiratory failure with hypoxia: Secondary | ICD-10-CM | POA: Diagnosis not present

## 2016-02-27 DIAGNOSIS — Z992 Dependence on renal dialysis: Secondary | ICD-10-CM | POA: Diagnosis not present

## 2016-02-27 DIAGNOSIS — Z794 Long term (current) use of insulin: Secondary | ICD-10-CM | POA: Diagnosis not present

## 2016-02-27 DIAGNOSIS — E1129 Type 2 diabetes mellitus with other diabetic kidney complication: Secondary | ICD-10-CM | POA: Diagnosis not present

## 2016-02-27 HISTORY — PX: INSERTION OF DIALYSIS CATHETER: SHX1324

## 2016-02-27 LAB — CBC WITH DIFFERENTIAL/PLATELET
Basophils Absolute: 0 10*3/uL (ref 0.0–0.1)
Basophils Relative: 0 %
Eosinophils Absolute: 0 10*3/uL (ref 0.0–0.7)
Eosinophils Relative: 0 %
HCT: 23.8 % — ABNORMAL LOW (ref 39.0–52.0)
HEMOGLOBIN: 7.7 g/dL — AB (ref 13.0–17.0)
LYMPHS ABS: 1.9 10*3/uL (ref 0.7–4.0)
LYMPHS PCT: 27 %
MCH: 30.4 pg (ref 26.0–34.0)
MCHC: 32.4 g/dL (ref 30.0–36.0)
MCV: 94.1 fL (ref 78.0–100.0)
Monocytes Absolute: 0.8 10*3/uL (ref 0.1–1.0)
Monocytes Relative: 11 %
NEUTROS PCT: 62 %
Neutro Abs: 4.3 10*3/uL (ref 1.7–7.7)
PLATELETS: 227 10*3/uL (ref 150–400)
RBC: 2.53 MIL/uL — AB (ref 4.22–5.81)
RDW: 17.8 % — AB (ref 11.5–15.5)
WBC: 7 10*3/uL (ref 4.0–10.5)

## 2016-02-27 LAB — RENAL FUNCTION PANEL
Albumin: 2.1 g/dL — ABNORMAL LOW (ref 3.5–5.0)
Anion gap: 10 (ref 5–15)
BUN: 32 mg/dL — ABNORMAL HIGH (ref 6–20)
CHLORIDE: 97 mmol/L — AB (ref 101–111)
CO2: 26 mmol/L (ref 22–32)
Calcium: 8.6 mg/dL — ABNORMAL LOW (ref 8.9–10.3)
Creatinine, Ser: 3.63 mg/dL — ABNORMAL HIGH (ref 0.61–1.24)
GFR, EST AFRICAN AMERICAN: 18 mL/min — AB (ref >60)
GFR, EST NON AFRICAN AMERICAN: 15 mL/min — AB (ref >60)
Glucose, Bld: 125 mg/dL — ABNORMAL HIGH (ref 65–99)
POTASSIUM: 4.5 mmol/L (ref 3.5–5.1)
Phosphorus: 4.8 mg/dL — ABNORMAL HIGH (ref 2.5–4.6)
Sodium: 133 mmol/L — ABNORMAL LOW (ref 135–145)

## 2016-02-27 LAB — GLUCOSE, CAPILLARY
GLUCOSE-CAPILLARY: 112 mg/dL — AB (ref 65–99)
GLUCOSE-CAPILLARY: 126 mg/dL — AB (ref 65–99)
GLUCOSE-CAPILLARY: 121 mg/dL — AB (ref 65–99)

## 2016-02-27 SURGERY — INSERTION OF DIALYSIS CATHETER
Anesthesia: Monitor Anesthesia Care | Site: Chest

## 2016-02-27 MED ORDER — HEPARIN SODIUM (PORCINE) 1000 UNIT/ML IJ SOLN
INTRAMUSCULAR | Status: DC | PRN
  Administered 2016-02-27: 1000 [IU]

## 2016-02-27 MED ORDER — LIDOCAINE-EPINEPHRINE (PF) 1 %-1:200000 IJ SOLN
INTRAMUSCULAR | Status: DC | PRN
  Administered 2016-02-27: 30 mL

## 2016-02-27 MED ORDER — FENTANYL CITRATE (PF) 100 MCG/2ML IJ SOLN
INTRAMUSCULAR | Status: AC
  Filled 2016-02-27: qty 2

## 2016-02-27 MED ORDER — CARVEDILOL 3.125 MG PO TABS
6.2500 mg | ORAL_TABLET | Freq: Once | ORAL | Status: AC
  Administered 2016-02-27: 6.25 mg via ORAL

## 2016-02-27 MED ORDER — 0.9 % SODIUM CHLORIDE (POUR BTL) OPTIME
TOPICAL | Status: DC | PRN
  Administered 2016-02-27: 1000 mL

## 2016-02-27 MED ORDER — FENTANYL CITRATE (PF) 100 MCG/2ML IJ SOLN
INTRAMUSCULAR | Status: DC | PRN
  Administered 2016-02-27: 50 ug via INTRAVENOUS

## 2016-02-27 MED ORDER — MIDAZOLAM HCL 2 MG/2ML IJ SOLN
INTRAMUSCULAR | Status: AC
  Filled 2016-02-27: qty 2

## 2016-02-27 MED ORDER — IOPAMIDOL (ISOVUE-300) INJECTION 61%
INTRAVENOUS | Status: AC
  Filled 2016-02-27: qty 50

## 2016-02-27 MED ORDER — ALPRAZOLAM 0.25 MG PO TABS: 0.2500 mg | ORAL_TABLET | Freq: Three times a day (TID) | ORAL | 0 refills | Status: DC | PRN

## 2016-02-27 MED ORDER — SODIUM CHLORIDE 0.9 % IV SOLN
INTRAVENOUS | Status: DC | PRN
  Administered 2016-02-27: 10:00:00 via INTRAVENOUS

## 2016-02-27 MED ORDER — PROPOFOL 500 MG/50ML IV EMUL
INTRAVENOUS | Status: DC | PRN
  Administered 2016-02-27: 25 ug/kg/min via INTRAVENOUS

## 2016-02-27 MED ORDER — HEPARIN SODIUM (PORCINE) 1000 UNIT/ML IJ SOLN
INTRAMUSCULAR | Status: AC
  Filled 2016-02-27: qty 1

## 2016-02-27 MED ORDER — POLYETHYLENE GLYCOL 3350 17 G PO PACK: 17.0000 g | PACK | Freq: Every day | ORAL | 0 refills | Status: DC | PRN

## 2016-02-27 MED ORDER — MIDAZOLAM HCL 5 MG/5ML IJ SOLN
INTRAMUSCULAR | Status: DC | PRN
  Administered 2016-02-27 (×2): 1 mg via INTRAVENOUS

## 2016-02-27 MED ORDER — LIDOCAINE-EPINEPHRINE (PF) 1 %-1:200000 IJ SOLN
INTRAMUSCULAR | Status: AC
  Filled 2016-02-27: qty 30

## 2016-02-27 MED ORDER — FENTANYL CITRATE (PF) 100 MCG/2ML IJ SOLN: 25.0000 ug | INTRAMUSCULAR | Status: DC | PRN

## 2016-02-27 MED ORDER — CARVEDILOL 6.25 MG PO TABS: 6.2500 mg | ORAL_TABLET | Freq: Two times a day (BID) | ORAL | Status: DC

## 2016-02-27 MED ORDER — HEPARIN SODIUM (PORCINE) 5000 UNIT/ML IJ SOLN
INTRAMUSCULAR | Status: DC | PRN
  Administered 2016-02-27: 10:00:00

## 2016-02-27 MED ORDER — INSULIN DETEMIR 100 UNIT/ML ~~LOC~~ SOLN: 10.0000 [IU] | Freq: Every day | SUBCUTANEOUS | 11 refills | Status: DC

## 2016-02-27 MED ORDER — DAPTOMYCIN 500 MG IV SOLR: 600.0000 mg | INTRAVENOUS | Status: DC

## 2016-02-27 SURGICAL SUPPLY — 45 items
BAG DECANTER FOR FLEXI CONT (MISCELLANEOUS) ×3 IMPLANT
BIOPATCH RED 1 DISK 7.0 (GAUZE/BANDAGES/DRESSINGS) ×2 IMPLANT
BIOPATCH RED 1IN DISK 7.0MM (GAUZE/BANDAGES/DRESSINGS) ×1
CATH PALINDROME RT-P 15FX19CM (CATHETERS) IMPLANT
CATH PALINDROME RT-P 15FX23CM (CATHETERS) ×3 IMPLANT
CATH PALINDROME RT-P 15FX28CM (CATHETERS) IMPLANT
CATH PALINDROME RT-P 15FX55CM (CATHETERS) IMPLANT
CATH STRAIGHT 5FR 65CM (CATHETERS) IMPLANT
COVER PROBE W GEL 5X96 (DRAPES) ×3 IMPLANT
COVER SURGICAL LIGHT HANDLE (MISCELLANEOUS) ×3 IMPLANT
DERMABOND ADVANCED (GAUZE/BANDAGES/DRESSINGS) ×2
DERMABOND ADVANCED .7 DNX12 (GAUZE/BANDAGES/DRESSINGS) ×1 IMPLANT
DRAPE C-ARM 42X72 X-RAY (DRAPES) ×3 IMPLANT
DRAPE CHEST BREAST 15X10 FENES (DRAPES) ×3 IMPLANT
GAUZE SPONGE 2X2 8PLY STRL LF (GAUZE/BANDAGES/DRESSINGS) IMPLANT
GAUZE SPONGE 4X4 16PLY XRAY LF (GAUZE/BANDAGES/DRESSINGS) ×3 IMPLANT
GLOVE BIO SURGEON STRL SZ 6.5 (GLOVE) ×2 IMPLANT
GLOVE BIO SURGEON STRL SZ7 (GLOVE) ×3 IMPLANT
GLOVE BIO SURGEONS STRL SZ 6.5 (GLOVE) ×1
GLOVE BIOGEL PI IND STRL 7.0 (GLOVE) ×1 IMPLANT
GLOVE BIOGEL PI IND STRL 7.5 (GLOVE) ×1 IMPLANT
GLOVE BIOGEL PI INDICATOR 7.0 (GLOVE) ×2
GLOVE BIOGEL PI INDICATOR 7.5 (GLOVE) ×2
GLOVE SURG SS PI 6.5 STRL IVOR (GLOVE) ×3 IMPLANT
GOWN STRL REUS W/ TWL LRG LVL3 (GOWN DISPOSABLE) ×3 IMPLANT
GOWN STRL REUS W/TWL LRG LVL3 (GOWN DISPOSABLE) ×6
KIT BASIN OR (CUSTOM PROCEDURE TRAY) ×3 IMPLANT
KIT ROOM TURNOVER OR (KITS) ×3 IMPLANT
NEEDLE 18GX1X1/2 (RX/OR ONLY) (NEEDLE) ×3 IMPLANT
NEEDLE HYPO 25GX1X1/2 BEV (NEEDLE) ×3 IMPLANT
NS IRRIG 1000ML POUR BTL (IV SOLUTION) ×3 IMPLANT
PACK SURGICAL SETUP 50X90 (CUSTOM PROCEDURE TRAY) ×3 IMPLANT
PAD ARMBOARD 7.5X6 YLW CONV (MISCELLANEOUS) ×6 IMPLANT
SET MICROPUNCTURE 5F STIFF (MISCELLANEOUS) IMPLANT
SOAP 2 % CHG 4 OZ (WOUND CARE) ×3 IMPLANT
SPONGE GAUZE 2X2 STER 10/PKG (GAUZE/BANDAGES/DRESSINGS)
SUT ETHILON 3 0 PS 1 (SUTURE) ×3 IMPLANT
SUT MNCRL AB 4-0 PS2 18 (SUTURE) ×3 IMPLANT
SYR 20CC LL (SYRINGE) ×6 IMPLANT
SYR 3ML LL SCALE MARK (SYRINGE) ×3 IMPLANT
SYR 5ML LL (SYRINGE) ×3 IMPLANT
SYR CONTROL 10ML LL (SYRINGE) ×3 IMPLANT
SYRINGE 10CC LL (SYRINGE) ×3 IMPLANT
WATER STERILE IRR 1000ML POUR (IV SOLUTION) ×3 IMPLANT
WIRE AMPLATZ SS-J .035X180CM (WIRE) IMPLANT

## 2016-02-27 NOTE — Anesthesia Preprocedure Evaluation (Addendum)
Anesthesia Evaluation  Patient identified by MRN, date of birth, ID band Patient awake    History of Anesthesia Complications (+) history of anesthetic complications  Airway Mallampati: II   Neck ROM: Full    Dental  (+) Teeth Intact, Dental Advisory Given   Pulmonary former smoker,    breath sounds clear to auscultation       Cardiovascular hypertension, +CHF   Rhythm:Regular Rate:Normal     Neuro/Psych  Neuromuscular disease    GI/Hepatic   Endo/Other  diabetes  Renal/GU Renal InsufficiencyRenal disease     Musculoskeletal   Abdominal   Peds  Hematology  (+) anemia ,   Anesthesia Other Findings   Reproductive/Obstetrics                            Anesthesia Physical Anesthesia Plan  ASA: III  Anesthesia Plan: MAC   Post-op Pain Management:    Induction: Intravenous  Airway Management Planned: Simple Face Mask  Additional Equipment:   Intra-op Plan:   Post-operative Plan:   Informed Consent: I have reviewed the patients History and Physical, chart, labs and discussed the procedure including the risks, benefits and alternatives for the proposed anesthesia with the patient or authorized representative who has indicated his/her understanding and acceptance.     Plan Discussed with:   Anesthesia Plan Comments:         Anesthesia Quick Evaluation

## 2016-02-27 NOTE — Anesthesia Postprocedure Evaluation (Signed)
Anesthesia Post Note  Patient: Alto Gandolfo Mower  Procedure(s) Performed: Procedure(s) (LRB): INSERTION OF DIALYSIS CATHETER-RIGHT INTERNAL JUGULA PLACEMENT (N/A)  Patient location during evaluation: PACU Anesthesia Type: MAC Level of consciousness: awake and alert Pain management: pain level controlled Vital Signs Assessment: post-procedure vital signs reviewed and stable Respiratory status: spontaneous breathing, nonlabored ventilation, respiratory function stable and patient connected to nasal cannula oxygen Cardiovascular status: stable and blood pressure returned to baseline Anesthetic complications: no    Last Vitals:  Vitals:   02/27/16 1140 02/27/16 1318  BP: (!) 111/48 (!) 107/47  Pulse: 69 70  Resp: 16 18  Temp: 36.5 C 36.8 C    Last Pain:  Vitals:   02/27/16 1318  TempSrc: Oral  PainSc:                  Boden Stucky,JAMES TERRILL

## 2016-02-27 NOTE — Interval H&P Note (Signed)
History and Physical Interval Note:  02/27/2016 9:07 AM  Lance [name]  has presented today for surgery, with the diagnosis of Stage III Chronic Kidney Disease N18.3  The various methods of treatment have been discussed with the patient and family. After consideration of risks, benefits and other options for treatment, the patient has consented to  Procedure(s): INSERTION OF DIALYSIS CATHETER (N/A) as a surgical intervention .  The patient's history has been reviewed, patient examined, no change in status, stable for surgery.  I have reviewed the patient's chart and labs.  Questions were answered to the patient's satisfaction.     Adele Barthel

## 2016-02-27 NOTE — Progress Notes (Signed)
Admit: 02/13/2016 LOS: 13  69M AoCKD3 (BL 1.7-1.8) after admit with pulm edema, pelvic OM on TMP/SMX; req RRT (CRRT-->iHD)  Subjective:  HD yesterday, tolerated well, 1L UF Fem Temp HD cath not yet removed TDC R IJ placed this AM  10/25 0701 - 10/26 0700 In: 803 [P.O.:360; NG/GT:240; IV Piggyback:112] Out: 226 [Urine:225]  Filed Weights   02/26/16 1310 02/26/16 1657 02/26/16 2112  Weight: 74.8 kg (164 lb 14.5 oz) 73.8 kg (162 lb 11.2 oz) 73.5 kg (162 lb 0.6 oz)    Scheduled Meds: . [MAR Hold] aspirin  81 mg Oral Daily  . [MAR Hold] carvedilol  6.25 mg Oral BID WC  . [MAR Hold] collagenase   Topical Daily  . [MAR Hold] DAPTOmycin (CUBICIN)  IV  600 mg Intravenous Q M,W,F-1800  . [MAR Hold] feeding supplement (NEPRO CARB STEADY)  237 mL Oral TID BM  . [MAR Hold] heparin subcutaneous  5,000 Units Subcutaneous Q8H  . [MAR Hold] insulin aspart  0-5 Units Subcutaneous QHS  . [MAR Hold] insulin aspart  0-9 Units Subcutaneous TID WC  . [MAR Hold] insulin detemir  10 Units Subcutaneous Daily  . [MAR Hold] mouth rinse  15 mL Mouth Rinse q12n4p  . [MAR Hold] multivitamin with minerals  1 tablet Oral Daily  . [MAR Hold] oxybutynin  5 mg Oral BID   Continuous Infusions:  PRN Meds:.[MAR Hold] acetaminophen **OR** [MAR Hold] acetaminophen, [MAR Hold] ALPRAZolam, [MAR Hold] bisacodyl, fentaNYL (SUBLIMAZE) injection, [MAR Hold] HYDROcodone-acetaminophen, [MAR Hold] ondansetron **OR** [MAR Hold] ondansetron (ZOFRAN) IV, [MAR Hold] polyethylene glycol  Current Labs: reviewed   Physical Exam:  Blood pressure (!) 117/48, pulse 70, temperature 97.7 F (36.5 C), resp. rate 11, height 6\' 4"  (1.93 m), weight 73.5 kg (162 lb 0.6 oz), SpO2 99 %. NAD RRR No edema CTAB AAOx3 S/nd/nd  A 1. AoCKD3 (BL 1.7-1.8), dialysis dependent, using temp Fem HD cath 1. Etiology: TMP/SMX, Cardiorenal, IC mediated with OM, ? vanc 2. No evidnce of recovery to date 3. Has R IJ TDC 10/26 VVS 4. No AVF or AVG  given t his is still acute 2. Ischial OM on daptomycin 3. Pulm Edema acute systolic CHF, resolved 4. MS, wheelchair depdendent 5. HTN 6. Spastic Quadriplegia 7. DM2 8. Anemia  P 1. HD MWF schedule, no evidence of sufficient GFR; 2K, 3.5h, 1L UF, no hep 2. Remove Fem Temp HD cath today 3. Might need acute outpt HD vs LTACH 4. Daily weights, Daily Renal Panel, Strict I/Os, Avoid nephrotoxins (NSAIDs, judicious IV Contrast)    Pearson Grippe MD 02/27/2016, 11:47 AM   Recent Labs Lab 02/25/16 0420 02/26/16 0511 02/27/16 0500  NA 134* 132* 133*  K 4.9 5.5* 4.5  CL 97* 95* 97*  CO2 27 25 26   GLUCOSE 120* 167* 125*  BUN 29* 50* 32*  CREATININE 3.29* 4.67* 3.63*  CALCIUM 8.4* 8.6* 8.6*  PHOS 4.2 5.5* 4.8*    Recent Labs Lab 02/24/16 0715 02/26/16 1328 02/27/16 0500  WBC 7.5 7.0 7.0  NEUTROABS  --   --  4.3  HGB 7.8* 7.2* 7.7*  HCT 24.0* 22.3* 23.8*  MCV 92.7 94.5 94.1  PLT 239 223 227

## 2016-02-27 NOTE — Op Note (Signed)
OPERATIVE NOTE  PROCEDURE: 1. Right internal jugular vein tunneled dialysis catheter placement 2. Right internal jugular vein cannulation under ultrasound guidance  PRE-OPERATIVE DIAGNOSIS: end-stage renal failure  POST-OPERATIVE DIAGNOSIS: same as above  SURGEON: Adele Barthel, MD  ANESTHESIA: local and MAC  ESTIMATED BLOOD LOSS: 30 cc  FINDING(S): 1.  Tips of the catheter in the right atrium on fluoroscopy 2.  No obvious pneumothorax on fluoroscopy  SPECIMEN(S):  none  INDICATIONS:   Lance [name] is a 75 y.o. male who presents with end stage renal disease.  The patient presents for tunneled dialysis catheter placement.  The patient is aware the risks of tunneled dialysis catheter placement include but are not limited to: bleeding, infection, central venous injury, pneumothorax, possible venous stenosis, possible malpositioning in the venous system, and possible infections related to long-term catheter presence.  The patient was aware of these risks and agreed to proceed.  DESCRIPTION: After written full informed consent was obtained from the patient, the patient was taken back to the operating room.  Prior to induction, the patient was given IV antibiotics.  After obtaining adequate sedation, the patient was prepped and draped in the standard fashion for a chest or neck tunneled dialysis catheter placement.   The cannulation site, the catheter exit site, and tract for the subcutaneous tunnel were then anesthestized with a total of 20 cc of 1% lidocaine without epinephrine.  Under ultrasound guidance, the right internal jugular vein was cannulated with the 18 gauge needle.  A J-wire was then placed down into the right ventricle under fluoroscopic guidance.  The wire was then secured in place with a clamp to the drapes.  I then made stab incisions at the neck and exit sites.   I dissected from the exit site to the cannulation site with a tunneler.   The subcutaneous tunnel was  dilated by passing a plastic dilator over the metal dissector. The wire was then unclamped and I removed the needle.  The skin tract and venotomy was dilated serially with dilators.  Finally, the dilator-sheath was placed under fluoroscopic guidance into the superior vena cava.  The dilator and wire were removed.  A 23 cm Palindrome catheter was placed under fluoroscopic guidance down into the right atrium.  The sheath was broken and peeled away while holding the catheter cuff at the level of the skin.  The back end of this catheter was transected, and docked onto the tunneler.  The distal catheter was delivered through the subcutaneous tunnel.  The catheter was transected a second time, revealing the two lumens of this catheter.  The ports were docked onto these two lumens.  The catheter collar was then snapped into place.  Each port was tested by aspirating and flushing.  No resistance was noted.  Each port was then thoroughly flushed with heparinized saline.  The catheter was secured in placed with two interrupted stitches of 3-0 Nylon tied to the catheter.  The neck incision was closed with a U-stitch of 4-0 Monocryl.  The neck and chest incision were cleaned and sterile bandages applied.  Each port was then loaded with concentrated heparin (1000 Units/mL) at the manufacturer recommended volumes to each port.  Sterile caps were applied to each port.    On completion fluoroscopy, the tips of the catheter were in the right atrium, and there was no evidence of pneumothorax.   COMPLICATIONS: none  CONDITION: stable   Adele Barthel, MD [facility] Office: [phone]  Pager: [phone]  02/27/2016, 10:55 AM

## 2016-02-27 NOTE — Discharge Summary (Signed)
Physician Discharge Summary  Lance [name] YKZ:[id number] [name]: [birth year] DOA: 02/13/2016  PCP: [name], MD  Admit date: 02/13/2016 Discharge date: 02/27/2016  Time spent: 40 minutes  Recommendations for Outpatient Follow-up:  1. Patient will need adjustment of his insulin as per sliding scale at facility 2. Patient will need continued daptomycin for pelvic osteomyelitis until 03/30/2016 as per Monday Wednesday Friday dialysis schedule and will continue dialysis as well 3. Patient will require further in that from nephrologist as an outpatient regarding needs for either fistula placement and to be cleared with end-stage renal disease and clipping 4. Consider suppressive therapy with either nitrofurantoin or Bactrim once completed course of Cubicin for his chronic indwelling Foley catheter  Discharge Diagnoses:  Principal Problem:   Acute systolic heart failure ([place]) Active Problems:   Multiple sclerosis ([place])   Sacral decubitus ulcer   Normocytic anemia   Acute renal failure (ARF) (HCC)   Spastic quadriplegia (HCC)   Chronic kidney disease, stage 3   Essential hypertension, benign   Acute osteomyelitis, pelvis, right (HCC)   Decubitus ulcer of right ischial area   DM (diabetes mellitus) type II controlled with renal manifestation (HCC)   Recurrent UTI (urinary tract infection)   AKI (acute kidney injury) (HCC)   Depression   Elevated troponin   Severe protein-calorie malnutrition (HCC)   Acute pulmonary edema (HCC)   Hypoxia   Decubitus ulcer of trochanter, stage 4 (HCC)   Generalized anxiety disorder   Cardiomyopathy ([place])   Acute renal failure with acute tubular necrosis superimposed on stage 3 chronic kidney disease (HCC)   NICM (nonischemic cardiomyopathy) ([place])   Acute respiratory failure ([place])   Discharge Condition: Stabilized  Diet recommendation: Heart healthy renal  Filed Weights   02/26/16 1310 02/26/16 1657 02/26/16 2112  Weight: 74.8 kg (164  lb 14.5 oz) 73.8 kg (162 lb 11.2 oz) 73.5 kg (162 lb 0.6 oz)    History of present illness:  74 ? multiple sclerosis,  spastic quadriplegia since ~ 2005,  chronic Foley for urinary retention on suppressive Bactrim chronic kidney disease stage III,  diabetes mellitus ty II ,  hypertension,  history of decubitus ulcers with history of ischial osteomyelitis-s/p L post thigh MSK V-Y advancement flap closure L Isch ulcer + R fasciocut rotation flap advancement [follows with Dr. Neysa Hotter [facility]]   Recently hospitalized (9/29-10/4) with R ischial pressure ulcer, that was debrided, treated with 6 days of vanco and zosyn (for this plus a klebsiella and strep UTI) Discharged  10/4 (with a creatinine of 1.82) to finish 4 more days of bactrim. Was brought back to the hospital from [facility] (there for rehab) with new onset dyspnea, and hypoxemia. CXR in the ED showed pulmonary edema, creatinine was noted to be elevated at 3.3 and he was admitted. BNP elevated >900, troponins elevated.   ECHO LV EF 35% - 40% (down from 50-55% 09/2014), moderate diffuse hypokinesis, mild to moderate MR and Grade 1 diastolic dysfunction. Patient found to have acute cardiomyopathy with acute respiratory failure requiring BiPAP and transferred to ICU. He also developed worsened renal function concerning for cardiorenal syndrome now requiring CRRT.   Hospital Course:    Acute hypoxic respiratory failure (HCC) Secondary to acute cardiomyopathy with pulmonary edema. now requiring CRRT. Wean oxygen to room air. Pulmonary edema with transudative fluids on CXR, improving with CRRT. Pulmonary was consulted and Recommended volume management with CRRT. Condition improved, no longer SOB and with good O2 sat on RA -now managing volume with  intermittent HD. -We will continue on same Monday Wednesday Friday schedule   Active Problems: Acute systolic CHF/nonischemic cardiomyopathy -EF of 35-40% as per echo. Elevated troponin likely due  to demand ischemia. No wall motion abnormalities. -On Coreg. Lasix discontinued due to worsening renal function. Not on ACEi/ ARB due to renal failure. -continue statins and ASA -Plan for intermittent HD for volume control  -Will need outpatient nephrology workup   Acute on chronic kidney disease (stage 3-4 at baseline) -Appears baseline creatinine of 1.8. Suspect cardiorenal etiology. ASO titre positive, ? Immune complex glomerulonephritis. -Patient started on CRRT, via rt femoral catheter. Tolerated well and volume status improved.  -[facility]; expectations are for kidney recovery. -Cr 4 range -Tunneled dialysis catheter placed 02/27/2016 and femoral catheter removed same day     Decubitus stage IV ulcer with Right ischial osteomyelitis -As suggested on MRI. Patient being followed by plastic surgeon at [facility] for possible muscle flap surgery at some point. -Prior culture from ischial wounds grew MRSA and enterococcus. Started on 6 weeks course of IV daptomycin by ID recommendations. -no fever and non-toxic appearance currently  -last antibiotic dose anticipated Mar 30, 2016   Severe protein calorie malnutrition -continue feeding supplement. -nutritonal service consulted   DM (diabetes mellitus) type II controlled with renal manifestation (HCC) -Currently on reduced dose Levemir 10 units at bedtime with sliding scale coverage. -Last A1c of 6.1. -will follow CBG's-sugars 120-150   Multiple sclerosis ([place]) -With spastic paraplegia diagnosed in mid 16s. -limited mobility and essentially wheelchair  Bound    Normocytic anemia -At baseline. -IV iron and aranesp as per renal discretion -no signs of acute GI bleed -hgb 7.8 last checked on 10/23   Chronic Foley catheter  -history of recurrent UTI.  -On suppressive trimethoprim at home.  -Continue oxybutynin. -foley in place   Discharge Exam: Vitals:   02/27/16 1125 02/27/16 1140  BP:  (!) 117/48 (!) 111/48  Pulse: 70 69  Resp: 11 16  Temp:  97.7 F (36.5 C)    General: Alert pleasant oriented no apparent distress just back from procedure Cardiovascular: S1-S2 no murmur rub or gallop Respiratory: Clinically clear no added sound no rales or rhonchi  Discharge Instructions   Discharge Instructions    Diet - low sodium heart healthy    Complete by:  As directed    Increase activity slowly    Complete by:  As directed      Current Discharge Medication List    START taking these medications   Details  ALPRAZolam (XANAX) 0.25 MG tablet Take 1 tablet (0.25 mg total) by mouth 3 (three) times daily as needed for anxiety. Qty: 30 tablet, Refills: 0    carvedilol (COREG) 6.25 MG tablet Take 1 tablet (6.25 mg total) by mouth 2 (two) times daily with a meal.    DAPTOmycin 600 mg in sodium chloride 0.9 % 100 mL Inject 600 mg into the vein every Monday, Wednesday, and Friday at 6 PM.    insulin detemir (LEVEMIR) 100 UNIT/ML injection Inject 0.1 mLs (10 Units total) into the skin daily. Qty: 10 mL, Refills: 11    polyethylene glycol (MIRALAX / GLYCOLAX) packet Take 17 g by mouth daily as needed for mild constipation. Qty: 14 each, Refills: 0      CONTINUE these medications which have NOT CHANGED   Details  aspirin 81 MG tablet Take 1 tablet (81 mg total) by mouth daily. Qty: 1 tablet, Refills: 0  fish oil-omega-3 fatty acids 1000 MG capsule Take 1 capsule (1 g total) by mouth daily. Qty: 1 capsule, Refills: 0    HYDROcodone-acetaminophen (NORCO/VICODIN) 5-325 MG tablet Take 1-2 tablets by mouth every 4 (four) hours as needed for moderate pain. Qty: 30 tablet, Refills: 0    insulin aspart (NOVOLOG FLEXPEN) 100 UNIT/ML FlexPen Inject 8 Units into the skin 3 (three) times daily with meals. Qty: 15 mL, Refills: 2    Multiple Vitamin (MULTIVITAMIN WITH MINERALS) TABS tablet Take 1 tablet by mouth daily.    oxybutynin (DITROPAN) 5 MG tablet Take 5 mg by mouth 2  (two) times daily.     simvastatin (ZOCOR) 20 MG tablet Take 20 mg by mouth every evening.    collagenase (SANTYL) ointment Apply topically daily. Qty: 15 g, Refills: 0    protein supplement shake (PREMIER PROTEIN) LIQD Take 325 mLs (11 oz total) by mouth 2 (two) times daily between meals. Qty: 19500 mL, Refills: 0      STOP taking these medications     Ascorbic Acid (VITAMIN C PO)      bisacodyl (DULCOLAX) 10 MG suppository      Cholecalciferol (VITAMIN D3) 2000 UNITS TABS      LEVEMIR FLEXTOUCH 100 UNIT/ML Pen        No Known Allergies    The results of significant diagnostics from this hospitalization (including imaging, microbiology, ancillary and laboratory) are listed below for reference.    Significant Diagnostic Studies: Dg Chest 2 View  Result Date: 02/14/2016 CLINICAL DATA:  Acute onset of wheezing and shortness of breath. Initial encounter. EXAM: CHEST  2 VIEW COMPARISON:  Chest radiograph performed 08/30/2014 FINDINGS: Vascular congestion is noted, with bilateral central airspace opacification and small bilateral pleural effusions, compatible with pulmonary edema. No pneumothorax is seen. The cardiomediastinal silhouette is borderline normal in size. No acute osseous abnormalities are identified. IMPRESSION: Vascular congestion, with bilateral central airspace opacification and small bilateral pleural effusions, compatible with pulmonary edema. Electronically Signed   By: Garald Balding M.D.   On: 02/14/2016 01:35   US Renal  Result Date: 02/14/2016 CLINICAL DATA:  Acute renal failure. EXAM: RENAL / URINARY TRACT ULTRASOUND COMPLETE COMPARISON:  Renal ultrasound of March 30, 2011. FINDINGS: Right Kidney: Length: 11.2 cm. Two simple cysts are seen in the upper pole, with the largest measuring 2.3 cm. Increased echogenicity of renal parenchyma is noted consistent with medical renal disease. No mass or hydronephrosis visualized. Left Kidney: Length: 10.7 cm.  Increased echogenicity of renal parenchyma is noted consistent with medical renal disease. No mass or hydronephrosis visualized. Bladder: Decompressed secondary to Foley catheter. IMPRESSION: Simple right renal cyst. Increased echogenicity of renal parenchyma bilaterally is noted consistent with medical renal disease. Electronically Signed   By: Marijo Conception, M.D.   On: 02/14/2016 08:36   Mr Sacrum/si Joints Wo Contrast  Result Date: 02/14/2016 CLINICAL DATA:  Right ischial pressure ulcer since September of this year. Wound culture from 01/30/2016 grew MRSA. EXAM: MR SACRUM WITHOUT CONTRAST TECHNIQUE: Multiplanar, multisequence MR imaging was performed. No intravenous contrast was administered. COMPARISON:  MRI of the pelvis 09/01/2014. FINDINGS: There is partial visualization of marrow edema in the left parasymphyseal pubic bone, unchanged. No fracture is identified. A large ulceration over the right ischial tuberosity extends to bone. The wound appears to be packed. Mildly increased T2 signal and thickening are seen about the margins of the wound which could be due to infection or granulation tissue. A small focus of marrow  edema is seen subjacent to the ulcer in the ischium compatible with osteomyelitis. No soft tissue abscess is identified. An ulceration is also seen over the left ischial tuberosity which may be healed. Marked edema is seen in subcutaneous soft tissues over the right greater trochanter. Small focus of mild marrow edema is seen in the underlying greater trochanter. Bone marrow signal is normal in the sacrum. There is no SI joint effusion. Small amount of fluid is present in the hip joints bilaterally most compatible with physiologic change. No muscle or tendon tear is seen. Imaged intrapelvic contents demonstrated a Foley catheter in place. The urinary bladder is decompressed. Its walls appear markedly thickened. IMPRESSION: Large ulceration over the right ischial tuberosity extends to  bone. Mild marrow edema is seen in the subjacent ischial tuberosity compatible with osteomyelitis. Subcutaneous edema over the right greater trochanter may be due to cellulitis. Small focus of mild marrow edema in the greater trochanter is worrisome for osteomyelitis. The urinary bladder is nearly completely decompressed but its walls appear markedly thickened which may be due to chronic bladder outlet obstruction and/or cystitis. Electronically Signed   By: Inge Rise M.D.   On: 02/14/2016 16:03   Dg Chest Port 1 View  Result Date: 02/27/2016 CLINICAL DATA:  Status post placement of a dialysis catheter. EXAM: PORTABLE CHEST 1 VIEW COMPARISON:  02/19/2016 FINDINGS: There is a new, dual-lumen, right internal jugular tunneled central venous catheter. The tip projects in the lower superior vena cava just above the caval atrial junction. No pneumothorax. Cardiac silhouette is normal in size. No mediastinal or hilar masses. Lungs are essentially clear. No evidence of pulmonary edema. No convincing pleural effusion. IMPRESSION: 1. Right internal jugular tunneled dual lumen central venous catheter is well positioned with its tip just above the caval atrial junction. 2. No pneumothorax. 3. No acute cardiopulmonary disease. Electronically Signed   By: Lajean Manes M.D.   On: 02/27/2016 11:28   Dg Chest Port 1 View  Result Date: 02/19/2016 CLINICAL DATA:  Pulmonary edema. EXAM: PORTABLE CHEST 1 VIEW COMPARISON:  02/18/2016. FINDINGS: Heart size normal. Persistent but improving bilateral pulmonary infiltrates/edema. Low lung volumes. Persistent pleural effusions, left side greater than right . No pneumothorax. Degenerative changes thoracic spine . IMPRESSION: Persistent but improving bilateral pulmonary infiltrates/edema. Low lung volumes. Persistent pleural effusions, left side greater than right. Electronically Signed   By: Marcello Moores  Register   On: 02/19/2016 07:26   Dg Chest Port 1 View  Result Date:  02/18/2016 CLINICAL DATA:  Acute respiratory distress.  Aspiration. EXAM: PORTABLE CHEST 1 VIEW COMPARISON:  02/18/2016 at 0754 hours FINDINGS: The cardiomediastinal silhouette is unchanged. Pulmonary vascular congestion and diffuse interstitial prominence, slightly increased from prior. Left mid lung and left greater than right basilar airspace opacities have mildly progressed. Small left pleural effusion may have mildly enlarged. No pneumothorax identified. IMPRESSION: Slight interval worsening of left greater than right lung opacities suggestive of edema, although superimposed infection or aspiration is not excluded. Slightly increased size of small left pleural effusion. Electronically Signed   By: Logan Bores M.D.   On: 02/18/2016 11:15   Dg Chest Port 1 View  Result Date: 02/18/2016 CLINICAL DATA:  Acute respiratory failure.  CHF EXAM: PORTABLE CHEST 1 VIEW COMPARISON:  02/17/2016 FINDINGS: Improvement in bilateral airspace disease compatible with pulmonary edema. There remains mild-to-moderate bilateral edema. Small bilateral effusions and bibasilar atelectasis have improved. IMPRESSION: Improving congestive heart failure with edema and bilateral effusions. Bibasilar consolidation has improved. Electronically Signed  By: Franchot Gallo M.D.   On: 02/18/2016 09:23   Dg Chest Port 1 View  Result Date: 02/17/2016 CLINICAL DATA:  Acute congestive heart failure. Acute renal failure. Worsening shortness of breath and hypoxia. EXAM: PORTABLE CHEST 1 VIEW COMPARISON:  02/14/2016 FINDINGS: Worsening diffuse symmetric bilateral airspace disease is seen, most likely due to worsening pulmonary edema. Bilateral pleural effusions are again noted. No pneumothorax visualized. Heart size is stable. IMPRESSION: Worsening diffuse symmetric airspace disease, most likely due ago pulmonary edema. Bilateral pleural effusions again noted. Electronically Signed   By: Earle Gell M.D.   On: 02/17/2016 14:15   Dg Fluoro  Guide Cv Line-no Report  Result Date: 02/27/2016 CLINICAL DATA:  FLOURO GUIDE CV LINE Fluoroscopy was utilized by the requesting physician.  No radiographic interpretation.   Dg Hip Unilat With Pelvis 2-3 Views Right  Result Date: 01/30/2016 CLINICAL DATA:  75 year old with approximate 6 week history of a decubitus ulcer involving the right buttock, with recent worsening of the infection. EXAM: DG HIP (WITH OR WITHOUT PELVIS) 2-3V RIGHT COMPARISON:  Right hip x-rays 05/28/2015.  MRI pelvis 09/01/2014. FINDINGS: No evidence of acute fracture or dislocation. No evidence of osteomyelitis. No visible joint effusion. Moderate joint space narrowing. Bone mineral density well-preserved. Included AP pelvis again shows the healed osteomyelitis with new bone formation at the left ischium. Contralateral left hip joint with symmetric moderate joint space narrowing. Sacroiliac joints and symphysis pubis intact. Mild degenerative changes involving the visualized lower lumbar spine. Penile prosthesis again noted. IMPRESSION: No acute osseous abnormality. No radiographic evidence of acute osteomyelitis. Electronically Signed   By: Evangeline Dakin M.D.   On: 01/30/2016 20:36    Microbiology: No results found for this or any previous visit (from the past 240 hour(s)).   Labs: Basic Metabolic Panel:  Recent Labs Lab 02/23/16 0630 02/24/16 0715 02/25/16 0420 02/26/16 0511 02/27/16 0500  NA 133* 134* 134* 132* 133*  K 3.9 4.2 4.9 5.5* 4.5  CL 97* 97* 97* 95* 97*  CO2 25 26 27 25 26   GLUCOSE 104* 176* 120* 167* 125*  BUN 35* 55* 29* 50* 32*  CREATININE 3.51* 4.73* 3.29* 4.67* 3.63*  CALCIUM 8.2* 8.6* 8.4* 8.6* 8.6*  PHOS 4.8* 6.3* 4.2 5.5* 4.8*   Liver Function Tests:  Recent Labs Lab 02/22/16 0742  02/23/16 0630 02/24/16 0715 02/25/16 0420 02/26/16 0511 02/27/16 0500  ALT 15*  --   --   --   --   --   --   ALBUMIN  --   < > 2.2* 2.2* 2.2* 2.1* 2.1*  < > = values in this interval not  displayed. No results for input(s): LIPASE, AMYLASE in the last 168 hours. No results for input(s): AMMONIA in the last 168 hours. CBC:  Recent Labs Lab 02/22/16 0742 02/24/16 0715 02/26/16 1328 02/27/16 0500  WBC 10.1 7.5 7.0 7.0  NEUTROABS  --   --   --  4.3  HGB 8.2* 7.8* 7.2* 7.7*  HCT 25.5* 24.0* 22.3* 23.8*  MCV 93.1 92.7 94.5 94.1  PLT 250 239 223 227   Cardiac Enzymes:  Recent Labs Lab 02/22/16 0741  CKTOTAL 25*   BNP: BNP (last 3 results)  Recent Labs  02/14/16 0020  BNP 927.0*    ProBNP (last 3 results) No results for input(s): PROBNP in the last 8760 hours.  CBG:  Recent Labs Lab 02/26/16 1152 02/26/16 1714 02/26/16 2107 02/27/16 0758 02/27/16 1114  GLUCAP 181* 91 153* 112* 126*  SignedNita Sells MD   [facility] 02/27/2016, 1:10 PM

## 2016-02-27 NOTE — Transfer of Care (Signed)
Immediate Anesthesia Transfer of Care Note  Patient: Lance [name]  Procedure(s) Performed: Procedure(s): INSERTION OF DIALYSIS CATHETER-RIGHT INTERNAL JUGULA PLACEMENT (N/A)  Patient Location: PACU  Anesthesia Type:MAC  Level of Consciousness: awake, alert  and oriented  Airway & Oxygen Therapy: Patient Spontanous Breathing  Post-op Assessment: Report given to RN, Post -op Vital signs reviewed and stable and Patient moving all extremities X 4  Post vital signs: Reviewed and stable  Last Vitals:  Vitals:   02/27/16 0556 02/27/16 0845  BP: (!) 117/49 (!) 116/45  Pulse: 77 80  Resp: 18 18  Temp: 36.9 C 37 C    Last Pain:  Vitals:   02/27/16 0845  TempSrc: Oral  PainSc:          Complications: No apparent anesthesia complications

## 2016-02-27 NOTE — Final Progress Note (Signed)
Report given to [facility] for discharge and transfer to room 5e29.  All questions answered.

## 2016-02-27 NOTE — H&P (View-Only) (Signed)
[facility]  Patient name: Lance [name] MRN: [id number] [name]: [birth year] Sex: male  REASON FOR CONSULT: dialysis catheter, consult is from Dr. Joelyn Oms.   HPI: Lance [name] is a 75 y.o. male with AKI on CKD3.  We are being asked to place a TDC. He was admitted secondary to new onset dyspnea andhypoxemia. CXR in the ED showed pulmonary edema, creatinine was noted to be elevated at 3.3 and he was admitted. BNP elevated >900, troponins elevated. ECHO shows LV EF 35% - 40% (down from 50-55% 09/2014).   A right femoral vein 20 cm Trialysis temp HD catheter was placed.  He has been on CRRT.    PMH significant for DM2, HTN, multiple sclerosis, spastic quadriplegia, urinary retention, h/o decubitus ulcers/ischial osteo currently being treated with daptomycin  Left(2016 with MRSA bacteremia treated with 7 weeks of vancomycin).      Past Medical History:  Diagnosis Date  . Acute CHF (congestive heart failure) ([place]) 02/14/2016  . Acute osteomyelitis, pelvis, right ([place])   . Acute renal failure ([place])   . Anemia in chronic renal disease   . Blood transfusion   . Cardiomyopathy ([place]) 02/17/2016  . Chronic spastic paraplegia (HCC)    secondary to MS  . CKD (chronic kidney disease), stage III   . Decreased sensation of lower extremity    due to MS  . Decubitus ulcer of ischium   . Decubitus ulcer of trochanter, stage 4 ([place]) 06/14/2015  . History of MRSA infection    urine  . History of recurrent UTIs   . Hypertension   . MS (multiple sclerosis) ([place])   . Neuralgia neuritis, sciatic nerve    MS for 30 years  . Neurogenic bladder   . PONV (postoperative nausea and vomiting)   . Self-catheterizes urinary bladder   . Type 2 diabetes mellitus (HCC)     Family History  Problem Relation Age of Onset  . Stroke Father   . Diabetes Father   . Diabetes Paternal Grandmother   . Stroke Paternal Grandfather   . Heart disease Neg Hx   . Cancer Neg Hx      SOCIAL HISTORY: Social History   Social History  . Marital status: Married    Spouse name: N/A  . Number of children: N/A  . Years of education: N/A   Occupational History  . Not on file.   Social History Main Topics  . Smoking status: Former Smoker    Types: Cigars    Quit date: 05/06/1983  . Smokeless tobacco: Never Used  . Alcohol use 1.8 oz/week    3 Glasses of wine per week     Comment: 3 glasses of wine per week  . Drug use: No  . Sexual activity: Not Currently    Birth control/ protection: None   Other Topics Concern  . Not on file   Social History Narrative  . No narrative on file    No Known Allergies  Current Facility-Administered Medications  Medication Dose Route Frequency Provider Last Rate Last Dose  . acetaminophen (TYLENOL) tablet 650 mg  650 mg Oral Q6H PRN Edwin Dada, MD   650 mg at 02/17/16 1142   Or  . acetaminophen (TYLENOL) suppository 650 mg  650 mg Rectal Q6H PRN Edwin Dada, MD      . ALPRAZolam Duanne Moron) tablet 0.25 mg  0.25 mg Oral TID PRN Eileen Stanford, PA-C   0.25 mg at 02/17/16 1139  .  aspirin chewable tablet 81 mg  81 mg Oral Daily Berton Mount, RPH   81 mg at 02/26/16 1009  . bisacodyl (DULCOLAX) suppository 10 mg  10 mg Rectal Daily PRN Edwin Dada, MD      . carvedilol (COREG) tablet 6.25 mg  6.25 mg Oral BID WC Roney Jaffe, MD   6.25 mg at 02/26/16 1009  . collagenase (SANTYL) ointment   Topical Daily Edwin Dada, MD      . DAPTOmycin (CUBICIN) 600 mg in sodium chloride 0.9 % IVPB  600 mg Intravenous Q M,W,F-1800 Rolla Flatten, [facility]      . feeding supplement (NEPRO CARB STEADY) liquid 237 mL  237 mL Oral TID BM Barton Dubois, MD   237 mL at 02/26/16 1011  . heparin injection 5,000 Units  5,000 Units Subcutaneous Q8H Nishant Dhungel, MD   5,000 Units at 02/26/16 0559  . HYDROcodone-acetaminophen (NORCO/VICODIN) 5-325 MG per tablet 1-2 tablet  1-2 tablet Oral Q4H PRN Edwin Dada, MD      . insulin aspart (novoLOG) injection 0-5 Units  0-5 Units Subcutaneous QHS Edwin Dada, MD   0 Units at 02/23/16 2138  . insulin aspart (novoLOG) injection 0-9 Units  0-9 Units Subcutaneous TID WC Edwin Dada, MD   2 Units at 02/26/16 1008  . insulin detemir (LEVEMIR) injection 10 Units  10 Units Subcutaneous Daily Edwin Dada, MD   10 Units at 02/26/16 1121  . MEDLINE mouth rinse  15 mL Mouth Rinse q12n4p Edwin Dada, MD   15 mL at 02/26/16 1200  . multivitamin with minerals tablet 1 tablet  1 tablet Oral Daily Clanford Marisa Hua, MD   1 tablet at 02/26/16 1009  . ondansetron (ZOFRAN) tablet 4 mg  4 mg Oral Q6H PRN Edwin Dada, MD       Or  . ondansetron (ZOFRAN) injection 4 mg  4 mg Intravenous Q6H PRN Edwin Dada, MD   4 mg at 02/17/16 1203  . oxybutynin (DITROPAN) tablet 5 mg  5 mg Oral BID Edwin Dada, MD   5 mg at 02/26/16 1009  . polyethylene glycol (MIRALAX / GLYCOLAX) packet 17 g  17 g Oral Daily PRN Nishant Dhungel, MD   17 g at 02/25/16 8182    REVIEW OF SYSTEMS:  [X]  denotes positive finding, [ ]  denotes negative finding Cardiac  Comments:  Chest pain or chest pressure:    Shortness of breath upon exertion:    Short of breath when lying flat:    Irregular heart rhythm:        Vascular    Pain in calf, thigh, or hip brought on by ambulation:    Pain in feet at night that wakes you up from your sleep:     Blood clot in your veins:    Leg swelling:         Pulmonary    Oxygen at home:    Productive cough:     Wheezing:         Neurologic    Sudden weakness in arms or legs:     Sudden numbness in arms or legs:     Sudden onset of difficulty speaking or slurred speech:    Temporary loss of vision in one eye:     Problems with dizziness:         Gastrointestinal    Blood in stool:     Vomited blood:  Genitourinary    Burning when urinating:     Blood in urine:          Psychiatric    Major depression:         Hematologic    Bleeding problems:    Problems with blood clotting too easily:        Skin    Rashes or ulcers:        Constitutional    Fever or chills:      PHYSICAL EXAM: Vitals:   02/25/16 1754 02/25/16 2135 02/26/16 0617 02/26/16 0938  BP: (!) 125/58 (!) 122/51 (!) 147/59 125/61  Pulse: 81 80 81 77  Resp: 17 17 18 18   Temp: 98 F (36.7 C) 98.6 F (37 C) 98.3 F (36.8 C) 98.3 F (36.8 C)  TempSrc: Oral Oral Oral Oral  SpO2: 100% 98% 97% 95%  Weight:  170 lb 13.7 oz (77.5 kg)    Height:        GENERAL: The patient is alert and oriented male, in no acute distress. The patient is seen in HD.The vital signs are documented above. VASCULAR: 2+ radial pulses and 2+ dorsalis pedis pulses bilaterally. Right femoral dialysis catheter PULMONARY: Non labored respiratory effort.  MUSCULOSKELETAL: Atrophy bilateral lower extremities.  NEUROLOGIC: No focal weakness or paresthesias are detected. SKIN: There are no ulcers or rashes noted. PSYCHIATRIC: The patient has a normal affect.  MEDICAL ISSUES: Acute on chronic kidney disease stage 3  The patient is currently dialyzing via a right femoral temporary catheter. Plan for tunneled dialysis catheter placement tomorrow with Dr. Bridgett Larsson.    Virgina Jock, PA-C [facility] [phone]  I have independently interviewed patient and agree with PA assessment and plan above. [facility] tomorrow. If he will need long term dialysis will need to get vein mapping as well. NPO at midnight. Discussed risks and benefits with patient and he agrees to proceed with one of my partners tomorrow.   Meryle Pugmire C. Donzetta Matters, MD [facility] Office: [phone] Pager: [phone]

## 2016-02-28 ENCOUNTER — Encounter (HOSPITAL_COMMUNITY): Payer: Self-pay | Admitting: Vascular Surgery

## 2016-02-28 LAB — CBC
HEMATOCRIT: 23.8 % — AB (ref 39.0–52.0)
Hemoglobin: 7.6 g/dL — ABNORMAL LOW (ref 13.0–17.0)
MCH: 30.6 pg (ref 26.0–34.0)
MCHC: 31.9 g/dL (ref 30.0–36.0)
MCV: 96 fL (ref 78.0–100.0)
Platelets: 198 10*3/uL (ref 150–400)
RBC: 2.48 MIL/uL — ABNORMAL LOW (ref 4.22–5.81)
RDW: 17.7 % — AB (ref 11.5–15.5)
WBC: 6.7 10*3/uL (ref 4.0–10.5)

## 2016-02-28 LAB — PROTIME-INR
INR: 1.11
Prothrombin Time: 14.4 seconds (ref 11.4–15.2)

## 2016-02-28 LAB — COMPREHENSIVE METABOLIC PANEL
ALBUMIN: 2.1 g/dL — AB (ref 3.5–5.0)
ALT: 14 U/L — ABNORMAL LOW (ref 17–63)
AST: 15 U/L (ref 15–41)
Alkaline Phosphatase: 62 U/L (ref 38–126)
Anion gap: 11 (ref 5–15)
BUN: 47 mg/dL — AB (ref 6–20)
CHLORIDE: 98 mmol/L — AB (ref 101–111)
CO2: 26 mmol/L (ref 22–32)
Calcium: 8.8 mg/dL — ABNORMAL LOW (ref 8.9–10.3)
Creatinine, Ser: 5.54 mg/dL — ABNORMAL HIGH (ref 0.61–1.24)
GFR calc Af Amer: 11 mL/min — ABNORMAL LOW (ref >60)
GFR calc non Af Amer: 9 mL/min — ABNORMAL LOW (ref >60)
GLUCOSE: 145 mg/dL — AB (ref 65–99)
POTASSIUM: 5.1 mmol/L (ref 3.5–5.1)
Sodium: 135 mmol/L (ref 135–145)
Total Bilirubin: 0.8 mg/dL (ref 0.3–1.2)
Total Protein: 7 g/dL (ref 6.5–8.1)

## 2016-02-28 NOTE — Consult Note (Signed)
Date: 02/28/2016                  Patient Name:  Lance [name]  MRN: [id number]  [name]: [birth year]  Age / Sex: 75 y.o., male         PCP: [name], MD                 Service Requesting Consult: Internal medicine                 Reason for Consult: ARF            History of Present Illness: Patient is a 75 y.o. male with medical problems of DM2, HTN, multiple sclerosis, spastic quadriplegia, urinary retention, h/o decubitus/ischial ulcers, baseline CKD. Recently hospitalized (9/29-10/4) with R ischial pressure ulcer, that was debrided, treated with 6 days of vanco and zosyn (for this plus a klebsiella and strep UTI) Discharged  10/4 (with a creatinine of 1.82)to finish 4 more days of bactrim. [facility] with new onset dyspnea, andhypoxemia. CXR in the ED showed pulmonary edema, creatinine was noted to be elevated at 3.3 and he was admitted. BNP elevated >900, troponins elevated. ECHO LV EF 35% - 40% (down from 50-55% 09/2014), moderate diffuse hypokinesis, mild to moderate MR and Grade 1 diastolic dysfunction who was admitted to [facility] on 02/27/2016 for ongoing management.   Tunneled dialysis cathter placed by VIR on 02/27/16    Medications: Outpatient medications: Prescriptions Prior to Admission  Medication Sig Dispense Refill Last Dose  . ALPRAZolam (XANAX) 0.25 MG tablet Take 1 tablet (0.25 mg total) by mouth 3 (three) times daily as needed for anxiety. 30 tablet 0   . aspirin 81 MG tablet Take 1 tablet (81 mg total) by mouth daily. 1 tablet 0 02/13/2016 at 0900  . carvedilol (COREG) 6.25 MG tablet Take 1 tablet (6.25 mg total) by mouth 2 (two) times daily with a meal.     . collagenase (SANTYL) ointment Apply topically daily. 15 g 0 unknown at unknown  . DAPTOmycin 600 mg in sodium chloride 0.9 % 100 mL Inject 600 mg into the vein every Monday, Wednesday, and Friday at 6 PM.     . fish oil-omega-3 fatty acids 1000  MG capsule Take 1 capsule (1 g total) by mouth daily. 1 capsule 0 02/13/2016 at 0900  . HYDROcodone-acetaminophen (NORCO/VICODIN) 5-325 MG tablet Take 1-2 tablets by mouth every 4 (four) hours as needed for moderate pain. 30 tablet 0 unknown at unknown  . insulin aspart (NOVOLOG FLEXPEN) 100 UNIT/ML FlexPen Inject 8 Units into the skin 3 (three) times daily with meals. 15 mL 2 02/13/2016 at 1700  . insulin detemir (LEVEMIR) 100 UNIT/ML injection Inject 0.1 mLs (10 Units total) into the skin daily. 10 mL 11   . Multiple Vitamin (MULTIVITAMIN WITH MINERALS) TABS tablet Take 1 tablet by mouth daily.   02/13/2016 at 0900  . oxybutynin (DITROPAN) 5 MG tablet Take 5 mg by mouth 2 (two) times daily.    02/13/2016 at 1700  . polyethylene glycol (MIRALAX / GLYCOLAX) packet Take 17 g by mouth daily as needed for mild constipation. 14 each 0   . protein supplement shake (PREMIER PROTEIN) LIQD Take 325 mLs (11 oz total) by mouth 2 (two) times daily between meals. (Patient not taking: Reported on 02/14/2016) 19500 mL 0 Not Taking at Unknown time  . simvastatin (ZOCOR) 20 MG tablet Take 20 mg by mouth every  evening.   02/13/2016 at 2100    Current medications: No current facility-administered medications for this encounter.     ASA Biotene Carvedilol cubicin Fish oil Heparin SQ Lantus nephrovite oxybutinin   Allergies: No Known Allergies    Past Medical History: Past Medical History:  Diagnosis Date  . Acute CHF (congestive heart failure) ([place]) 02/14/2016  . Acute osteomyelitis, pelvis, right ([place])   . Acute renal failure ([place])   . Anemia in chronic renal disease   . Blood transfusion   . Cardiomyopathy ([place]) 02/17/2016  . Chronic spastic paraplegia (HCC)    secondary to MS  . CKD (chronic kidney disease), stage III   . Decreased sensation of lower extremity    due to MS  . Decubitus ulcer of ischium   . Decubitus ulcer of trochanter, stage 4 ([place]) 06/14/2015  . History of MRSA  infection    urine  . History of recurrent UTIs   . Hypertension   . MS (multiple sclerosis) ([place])   . Neuralgia neuritis, sciatic nerve    MS for 30 years  . Neurogenic bladder   . PONV (postoperative nausea and vomiting)   . Self-catheterizes urinary bladder   . Type 2 diabetes mellitus ([place])      Past Surgical History: Past Surgical History:  Procedure Laterality Date  . APPLICATION OF A-CELL OF EXTREMITY Left 09/05/2014   Procedure: PLACEMENT OF ACELL AND VAC;  Surgeon: Theodoro Kos, DO;  Location: [facility];  Service: Plastics;  Laterality: Left;  . EXTRACORPOREAL SHOCK WAVE LITHOTRIPSY  03-23-2011  . FINGER SURGERY  2004  . INCISION AND DRAINAGE OF WOUND Left 09/05/2014   Procedure: IRRIGATION AND DEBRIDEMENT OF LEFT ISCHIUM WOUND WITH ;  Surgeon: Theodoro Kos, DO;  Location: [facility];  Service: Plastics;  Laterality: Left;  . TONSILLECTOMY    . WOUND DEBRIDEMENT     [facility]      Family History: Family History  Problem Relation Age of Onset  . Stroke Father   . Diabetes Father   . Diabetes Paternal Grandmother   . Stroke Paternal Grandfather   . Heart disease Neg Hx   . Cancer Neg Hx      Social History: Social History   Social History  . Marital status: Married    Spouse name: N/A  . Number of children: N/A  . Years of education: N/A   Occupational History  . Not on file.   Social History Main Topics  . Smoking status: Former Smoker    Types: Cigars    Quit date: 05/06/1983  . Smokeless tobacco: Never Used  . Alcohol use 1.8 oz/week    3 Glasses of wine per week     Comment: 3 glasses of wine per week  . Drug use: No  . Sexual activity: Not Currently    Birth control/ protection: None   Other Topics Concern  . Not on file   Social History Narrative  . No narrative on file     Review of Systems: Gen: no fever, chills HEENT:  Denies c/o CV: recent heart failure, No h/o MI Resp: no lung c/o GI: able to eat without N/V GU : self cath at  home MS: Multiple sclerosis, wheelchair dependent Derm:  no c/o Psych: no c/o Heme:  No c/o  Neuro: weakness of legs Endocrine: DM-2  Vital Signs: There were no vitals taken for this visit.  No intake or output data in the 24 hours ending 02/28/16 0906  Weight trends:  There were no vitals filed for this visit.  Physical Exam: General:  Thin cachectic gentleman, laying in bed, NAD  HEENT Anicteric, moist oral mucus membranes  Neck:  supple  Lungs:  normal breathing effort, clear to auscultation  Heart::  regular, no rub or gallop  Abdomen: Soft, non tender  Extremities:  muscle wasting, no leg edema  Neurologic: A & O  Skin: No acute rashes  Access: Rt IJ PC  Foley: Present with dark urine       Lab results: Basic Metabolic Panel:  Recent Labs Lab 02/25/16 0420 02/26/16 0511 02/27/16 0500 02/28/16 0555  NA 134* 132* 133* 135  K 4.9 5.5* 4.5 5.1  CL 97* 95* 97* 98*  CO2 27 25 26 26   GLUCOSE 120* 167* 125* 145*  BUN 29* 50* 32* 47*  CREATININE 3.29* 4.67* 3.63* 5.54*  CALCIUM 8.4* 8.6* 8.6* 8.8*  PHOS 4.2 5.5* 4.8*  --     Liver Function Tests:  Recent Labs Lab 02/28/16 0555  AST 15  ALT 14*  ALKPHOS 62  BILITOT 0.8  PROT 7.0  ALBUMIN 2.1*   No results for input(s): LIPASE, AMYLASE in the last 168 hours. No results for input(s): AMMONIA in the last 168 hours.  CBC:  Recent Labs Lab 02/27/16 0500 02/28/16 0555  WBC 7.0 6.7  NEUTROABS 4.3  --   HGB 7.7* 7.6*  HCT 23.8* 23.8*  MCV 94.1 96.0  PLT 227 198    Cardiac Enzymes:  Recent Labs Lab 02/22/16 0741  CKTOTAL 25*    BNP: Invalid input(s): POCBNP  CBG:  Recent Labs Lab 02/26/16 1714 02/26/16 2107 02/27/16 0758 02/27/16 1114 02/27/16 1317  GLUCAP 91 153* 112* 126* 121*    Microbiology: Recent Results (from the past 720 hour(s))  Urine culture     Status: Abnormal   Collection Time: 01/30/16 11:20 AM  Result Value Ref Range Status   Specimen Description URINE,  CLEAN CATCH  Final   Special Requests NONE  Final   Culture (A)  Final    >=100,000 COLONIES/mL VIRIDANS STREPTOCOCCUS Standardized susceptibility testing for this organism is not available. >=100,000 COLONIES/mL KLEBSIELLA PNEUMONIAE    Report Status 02/01/2016 FINAL  Final   Organism ID, Bacteria KLEBSIELLA PNEUMONIAE (A)  Final      Susceptibility   Klebsiella pneumoniae - MIC*    AMPICILLIN >=32 RESISTANT Resistant     CEFAZOLIN <=4 SENSITIVE Sensitive     CEFTRIAXONE <=1 SENSITIVE Sensitive     CIPROFLOXACIN <=0.25 SENSITIVE Sensitive     GENTAMICIN <=1 SENSITIVE Sensitive     IMIPENEM <=0.25 SENSITIVE Sensitive     NITROFURANTOIN 64 INTERMEDIATE Intermediate     TRIMETH/SULFA >=320 RESISTANT Resistant     AMPICILLIN/SULBACTAM 4 SENSITIVE Sensitive     PIP/TAZO <=4 SENSITIVE Sensitive     Extended ESBL NEGATIVE Sensitive     * >=100,000 COLONIES/mL KLEBSIELLA PNEUMONIAE  Culture, blood (routine x 2)     Status: None   Collection Time: 01/30/16  4:11 PM  Result Value Ref Range Status   Specimen Description BLOOD LEFT ANTECUBITAL  Final   Special Requests BOTTLES DRAWN AEROBIC AND ANAEROBIC 5ML EA  Final   Culture   Final    NO GROWTH 5 DAYS Performed at [facility]    Report Status 02/04/2016 FINAL  Final  Culture, blood (routine x 2)     Status: None   Collection Time: 01/30/16  4:24 PM  Result Value Ref Range Status  Specimen Description BLOOD RIGHT ANTECUBITAL  Final   Special Requests BOTTLES DRAWN AEROBIC AND ANAEROBIC 5CC  Final   Culture   Final    NO GROWTH 5 DAYS Performed at [facility]    Report Status 02/04/2016 FINAL  Final  Wound or Superficial Culture     Status: None   Collection Time: 01/30/16  4:58 PM  Result Value Ref Range Status   Specimen Description WOUND  Final   Special Requests DECUBITUS  Final   Gram Stain   Final    ABUNDANT WBC PRESENT,BOTH PMN AND MONONUCLEAR ABUNDANT GRAM NEGATIVE RODS ABUNDANT GRAM POSITIVE COCCI  IN PAIRS Performed at [facility]    Culture   Final    FEW METHICILLIN RESISTANT STAPHYLOCOCCUS AUREUS FEW ENTEROCOCCUS FAECALIS    Report Status 02/03/2016 FINAL  Final   Organism ID, Bacteria METHICILLIN RESISTANT STAPHYLOCOCCUS AUREUS  Final   Organism ID, Bacteria ENTEROCOCCUS FAECALIS  Final      Susceptibility   Enterococcus faecalis - MIC*    AMPICILLIN <=2 SENSITIVE Sensitive     VANCOMYCIN 1 SENSITIVE Sensitive     GENTAMICIN SYNERGY SENSITIVE Sensitive     * FEW ENTEROCOCCUS FAECALIS   Methicillin resistant staphylococcus aureus - MIC*    CIPROFLOXACIN >=8 RESISTANT Resistant     ERYTHROMYCIN >=8 RESISTANT Resistant     GENTAMICIN <=0.5 SENSITIVE Sensitive     OXACILLIN >=4 RESISTANT Resistant     TETRACYCLINE <=1 SENSITIVE Sensitive     VANCOMYCIN 1 SENSITIVE Sensitive     TRIMETH/SULFA >=320 RESISTANT Resistant     CLINDAMYCIN <=0.25 SENSITIVE Sensitive     RIFAMPIN <=0.5 SENSITIVE Sensitive     Inducible Clindamycin NEGATIVE Sensitive     * FEW METHICILLIN RESISTANT STAPHYLOCOCCUS AUREUS  MRSA PCR Screening     Status: None   Collection Time: 01/31/16  4:30 AM  Result Value Ref Range Status   MRSA by PCR NEGATIVE NEGATIVE Final    Comment:        The GeneXpert MRSA Assay (FDA approved for NASAL specimens only), is one component of a comprehensive MRSA colonization surveillance program. It is not intended to diagnose MRSA infection nor to guide or monitor treatment for MRSA infections.   Culture, blood (Routine X 2) w Reflex to ID Panel     Status: None   Collection Time: 02/15/16  1:00 PM  Result Value Ref Range Status   Specimen Description BLOOD RIGHT ARM  2 ML IN YELLOW  Final   Special Requests NONE  Final   Culture   Final    NO GROWTH 5 DAYS Performed at [facility]    Report Status 02/20/2016 FINAL  Final  Culture, blood (Routine X 2) w Reflex to ID Panel     Status: None   Collection Time: 02/15/16  1:00 PM  Result Value Ref  Range Status   Specimen Description BLOOD LEFT ARM  3 ML IN YELLOW  Final   Special Requests NONE  Final   Culture   Final    NO GROWTH 5 DAYS Performed at [facility]    Report Status 02/20/2016 FINAL  Final     Coagulation Studies:  Recent Labs  02/28/16 0555  LABPROT 14.4  INR 1.11    Urinalysis: No results for input(s): COLORURINE, LABSPEC, PHURINE, GLUCOSEU, HGBUR, BILIRUBINUR, KETONESUR, PROTEINUR, UROBILINOGEN, NITRITE, LEUKOCYTESUR in the last 72 hours.  Invalid input(s): APPERANCEUR      Imaging: Dg Chest Port 1  View  Result Date: 02/27/2016 CLINICAL DATA:  Status post placement of a dialysis catheter. EXAM: PORTABLE CHEST 1 VIEW COMPARISON:  02/19/2016 FINDINGS: There is a new, dual-lumen, right internal jugular tunneled central venous catheter. The tip projects in the lower superior vena cava just above the caval atrial junction. No pneumothorax. Cardiac silhouette is normal in size. No mediastinal or hilar masses. Lungs are essentially clear. No evidence of pulmonary edema. No convincing pleural effusion. IMPRESSION: 1. Right internal jugular tunneled dual lumen central venous catheter is well positioned with its tip just above the caval atrial junction. 2. No pneumothorax. 3. No acute cardiopulmonary disease. Electronically Signed   By: Lajean Manes M.D.   On: 02/27/2016 11:28   Dg Fluoro Guide Cv Line-no Report  Result Date: 02/27/2016 CLINICAL DATA:  FLOURO GUIDE CV LINE Fluoroscopy was utilized by the requesting physician.  No radiographic interpretation.      Assessment & Plan: Pt is a 75 y.o. caucasian male with medical problems of DM2, HTN, multiple sclerosis, spastic quadriplegia, urinary retention, h/o decubitus/ischial ulcers, baseline CKD. Recently hospitalized (9/29-10/4) with R ischial pressure ulcer, that was debrided, treated with 6 days of vanco and zosyn (for this plus a klebsiella and strep UTI) Discharged  10/4 (with a creatinine of  1.82)to finish 4 more days of bactrim. [facility] with new onset dyspnea, andhypoxemia. CXR in the ED showed pulmonary edema, creatinine was noted to be elevated at 3.3 and he was admitted. BNP elevated >900, troponins elevated. ECHO LV EF 35% - 40% (down from 50-55% 09/2014), moderate diffuse hypokinesis, mild to moderate MR and Grade 1 diastolic dysfunction who was transferred to [facility] on 02/27/2016 for ongoing management.   1. ARF , oliguric ATN vs bactrim vs Immune complex disease from Osteomyelitis- Biopsy not done because of underlying infections and debility Patient was initially started on CRRT on 10/16 Tunneled dialysis cathter placed on 10/26 Discussed various dialysis options with the patient, incase of no renal recovery. He is not able to do home modalities by himself but does not want to place burden on his wife. In that case, outpatient HD would be the only option. Patient is working to get back to be able to use wheel chair.  For now, will place on M-W-F Schedule No UF for today  2. CKD st 3/4 at baseline Baseline Cr 1.8 per noted  3. Anemia of CKD - will start on Aranesp - oral iron supplements  4. St 4 decubitis and rt ischial Osteomyelitis - Patient being followed by plastic surgeon at [facility] for possible muscle flap surgery at some point. -Prior culture from ischial wounds grew MRSA and enterococcus. Started on 6 weeks course of IV daptomycin by ID recommendations. -no fever and non-toxic appearance currently  -last antibiotic dose anticipated Mar 30, 2016  5. Multiple sclerosis - patient was doing self cath at home - Currently has a foley cathter

## 2016-03-02 LAB — COMPREHENSIVE METABOLIC PANEL
ALBUMIN: 2 g/dL — AB (ref 3.5–5.0)
ALK PHOS: 61 U/L (ref 38–126)
ALT: 14 U/L — ABNORMAL LOW (ref 17–63)
ANION GAP: 11 (ref 5–15)
AST: 15 U/L (ref 15–41)
BUN: 45 mg/dL — ABNORMAL HIGH (ref 6–20)
CALCIUM: 8.6 mg/dL — AB (ref 8.9–10.3)
CHLORIDE: 101 mmol/L (ref 101–111)
CO2: 24 mmol/L (ref 22–32)
Creatinine, Ser: 6.45 mg/dL — ABNORMAL HIGH (ref 0.61–1.24)
GFR calc Af Amer: 9 mL/min — ABNORMAL LOW (ref >60)
GFR calc non Af Amer: 8 mL/min — ABNORMAL LOW (ref >60)
GLUCOSE: 141 mg/dL — AB (ref 65–99)
Potassium: 4.2 mmol/L (ref 3.5–5.1)
SODIUM: 136 mmol/L (ref 135–145)
Total Bilirubin: 0.5 mg/dL (ref 0.3–1.2)
Total Protein: 7.2 g/dL (ref 6.5–8.1)

## 2016-03-02 LAB — CK: Total CK: 29 U/L — ABNORMAL LOW (ref 49–397)

## 2016-03-02 LAB — CBC
HEMATOCRIT: 21.8 % — AB (ref 39.0–52.0)
HEMOGLOBIN: 7.1 g/dL — AB (ref 13.0–17.0)
MCH: 31 pg (ref 26.0–34.0)
MCHC: 32.6 g/dL (ref 30.0–36.0)
MCV: 95.2 fL (ref 78.0–100.0)
Platelets: 198 10*3/uL (ref 150–400)
RBC: 2.29 MIL/uL — ABNORMAL LOW (ref 4.22–5.81)
RDW: 17.4 % — ABNORMAL HIGH (ref 11.5–15.5)
WBC: 5.8 10*3/uL (ref 4.0–10.5)

## 2016-03-02 LAB — PROTIME-INR
INR: 1.04
PROTHROMBIN TIME: 13.6 s (ref 11.4–15.2)

## 2016-03-02 LAB — PHOSPHORUS: Phosphorus: 5.1 mg/dL — ABNORMAL HIGH (ref 2.5–4.6)

## 2016-03-03 LAB — BASIC METABOLIC PANEL
ANION GAP: 9 (ref 5–15)
BUN: 36 mg/dL — ABNORMAL HIGH (ref 6–20)
CHLORIDE: 101 mmol/L (ref 101–111)
CO2: 26 mmol/L (ref 22–32)
Calcium: 8.9 mg/dL (ref 8.9–10.3)
Creatinine, Ser: 5.19 mg/dL — ABNORMAL HIGH (ref 0.61–1.24)
GFR calc non Af Amer: 10 mL/min — ABNORMAL LOW (ref >60)
GFR, EST AFRICAN AMERICAN: 11 mL/min — AB (ref >60)
Glucose, Bld: 123 mg/dL — ABNORMAL HIGH (ref 65–99)
POTASSIUM: 4.3 mmol/L (ref 3.5–5.1)
Sodium: 136 mmol/L (ref 135–145)

## 2016-03-03 LAB — CBC WITH DIFFERENTIAL/PLATELET
BASOS ABS: 0 10*3/uL (ref 0.0–0.1)
BASOS PCT: 0 %
Eosinophils Absolute: 0.1 10*3/uL (ref 0.0–0.7)
Eosinophils Relative: 2 %
HEMATOCRIT: 22.4 % — AB (ref 39.0–52.0)
HEMOGLOBIN: 7.2 g/dL — AB (ref 13.0–17.0)
Lymphocytes Relative: 25 %
Lymphs Abs: 1.5 10*3/uL (ref 0.7–4.0)
MCH: 30.3 pg (ref 26.0–34.0)
MCHC: 32.1 g/dL (ref 30.0–36.0)
MCV: 94.1 fL (ref 78.0–100.0)
MONOS PCT: 9 %
Monocytes Absolute: 0.6 10*3/uL (ref 0.1–1.0)
NEUTROS ABS: 4 10*3/uL (ref 1.7–7.7)
NEUTROS PCT: 64 %
Platelets: 226 10*3/uL (ref 150–400)
RBC: 2.38 MIL/uL — ABNORMAL LOW (ref 4.22–5.81)
RDW: 16.9 % — ABNORMAL HIGH (ref 11.5–15.5)
WBC: 6.3 10*3/uL (ref 4.0–10.5)

## 2016-03-03 LAB — PTH, INTACT AND CALCIUM
CALCIUM TOTAL (PTH): 9.5 mg/dL (ref 8.6–10.2)
PTH: 51 pg/mL (ref 15–65)

## 2016-03-03 NOTE — Progress Notes (Addendum)
[facility]  ROUNDING NOTE   Subjective:  Patient remains dialysis dependent at the moment. Oliguria persists. Patient in good spirits at the moment. Family at bedside.    Objective:  Vital signs in last 24 hours:  Temperature 98.5 pulse 74 respirations 15 blood pressure 117/61  Physical Exam: General: No acute distress  Head: Normocephalic, atraumatic. Moist oral mucosal membranes  Eyes: Anicteric  Neck: Supple, trachea midline  Lungs:  Clear to auscultation, normal effort  Heart: S1S2 no rubs  Abdomen:  Soft, nontender, bowel sounds present  Extremities: Trace peripheral edema.  Neurologic: Nonfocal, moving all four extremities  Skin: No lesions  Access: R IJ permcath    Basic Metabolic Panel:  Recent Labs Lab 02/26/16 0511 02/27/16 0500 02/28/16 0555 03/02/16 0544 03/03/16 0714  NA 132* 133* 135 136 136  K 5.5* 4.5 5.1 4.2 4.3  CL 95* 97* 98* 101 101  CO2 25 26 26 24 26   GLUCOSE 167* 125* 145* 141* 123*  BUN 50* 32* 47* 45* 36*  CREATININE 4.67* 3.63* 5.54* 6.45* 5.19*  CALCIUM 8.6* 8.6* 8.8* 8.6*  9.5 8.9  PHOS 5.5* 4.8*  --  5.1*  --     Liver Function Tests:  Recent Labs Lab 02/26/16 0511 02/27/16 0500 02/28/16 0555 03/02/16 0544  AST  --   --  15 15  ALT  --   --  14* 14*  ALKPHOS  --   --  62 61  BILITOT  --   --  0.8 0.5  PROT  --   --  7.0 7.2  ALBUMIN 2.1* 2.1* 2.1* 2.0*   No results for input(s): LIPASE, AMYLASE in the last 168 hours. No results for input(s): AMMONIA in the last 168 hours.  CBC:  Recent Labs Lab 02/26/16 1328 02/27/16 0500 02/28/16 0555 03/02/16 0544 03/03/16 0714  WBC 7.0 7.0 6.7 5.8 6.3  NEUTROABS  --  4.3  --   --  4.0  HGB 7.2* 7.7* 7.6* 7.1* 7.2*  HCT 22.3* 23.8* 23.8* 21.8* 22.4*  MCV 94.5 94.1 96.0 95.2 94.1  PLT 223 227 198 198 226    Cardiac Enzymes:  Recent Labs Lab 03/02/16 0544  CKTOTAL 29*    BNP: Invalid input(s): POCBNP  CBG:  Recent Labs Lab 02/26/16 1714  02/26/16 2107 02/27/16 0758 02/27/16 1114 02/27/16 1317  GLUCAP 91 153* 112* 126* 121*    Microbiology: Results for orders placed or performed during the hospital encounter of 02/13/16  Culture, blood (Routine X 2) w Reflex to ID Panel     Status: None   Collection Time: 02/15/16  1:00 PM  Result Value Ref Range Status   Specimen Description BLOOD RIGHT ARM  2 ML IN YELLOW  Final   Special Requests NONE  Final   Culture   Final    NO GROWTH 5 DAYS Performed at [facility]    Report Status 02/20/2016 FINAL  Final  Culture, blood (Routine X 2) w Reflex to ID Panel     Status: None   Collection Time: 02/15/16  1:00 PM  Result Value Ref Range Status   Specimen Description BLOOD LEFT ARM  3 ML IN YELLOW  Final   Special Requests NONE  Final   Culture   Final    NO GROWTH 5 DAYS Performed at [facility]    Report Status 02/20/2016 FINAL  Final    Coagulation Studies:  Recent Labs  03/02/16 0544  LABPROT 13.6  INR  1.04    Urinalysis: No results for input(s): COLORURINE, LABSPEC, PHURINE, GLUCOSEU, HGBUR, BILIRUBINUR, KETONESUR, PROTEINUR, UROBILINOGEN, NITRITE, LEUKOCYTESUR in the last 72 hours.  Invalid input(s): APPERANCEUR    Imaging: No results found.   Medications:    Aranesp 60   Assessment/ Plan:  75 y.o. caucasian male with medical problems of DM2, HTN, multiple sclerosis, spastic quadriplegia, urinary retention, h/o decubitus/ischial ulcers, baseline CKD. Recently hospitalized (9/29-10/4) with R ischial pressure ulcer, that was debrided, treated with 6 days of vanco and zosyn (for this plus a klebsiella and strep UTI) Discharged 10/4 (with a creatinine of 1.82)to finish 4 more days of bactrim. [facility] with new onset dyspnea, andhypoxemia. CXR in the ED showed pulmonary edema, creatinine was noted to be elevated at 3.3 and he was admitted. BNP elevated >900, troponins elevated.  ECHO LV EF 35% - 40% (down from 50-55% 09/2014), moderate diffuse hypokinesis, mild to moderate MR and Grade 1 diastolic dysfunction who was transferred to [facility] from [facility] on 02/27/2016 for ongoing management.   1. ARF , oliguric ATN vs bactrim vs Immune complex disease from Osteomyelitis- Biopsy not done because of underlying infections and debility Patient was initially started on CRRT on 10/16 Tunneled dialysis cathter placed on 10/26 -  Patient completed hemodialysis yesterday. He tolerated well. We will plan for hemodialysis again on Wednesday. Still too early to tell as to whether this represents end-stage renal disease.  2. CKD st 3/4 at baseline Baseline Cr 1.8 per Prior notes.  3. Anemia of CKD - hemoglobin currently 7.2. Patient on Aranesp per protocol.  4. St 4 decubitis and rt ischial Osteomyelitis:  Management per primary team.  5.  Secondary hyperparathyroidism. Posture is noted to be 5.1. Continue to monitor bone metabolism parameters.   LOS: 0 Lance [name] 10/31/20174:12 PM

## 2016-03-04 LAB — CBC
HEMATOCRIT: 21.1 % — AB (ref 39.0–52.0)
Hemoglobin: 6.9 g/dL — CL (ref 13.0–17.0)
MCH: 30.7 pg (ref 26.0–34.0)
MCHC: 32.7 g/dL (ref 30.0–36.0)
MCV: 93.8 fL (ref 78.0–100.0)
PLATELETS: 222 10*3/uL (ref 150–400)
RBC: 2.25 MIL/uL — AB (ref 4.22–5.81)
RDW: 16.9 % — ABNORMAL HIGH (ref 11.5–15.5)
WBC: 7.3 10*3/uL (ref 4.0–10.5)

## 2016-03-04 LAB — RENAL FUNCTION PANEL
ALBUMIN: 1.9 g/dL — AB (ref 3.5–5.0)
ANION GAP: 10 (ref 5–15)
BUN: 50 mg/dL — ABNORMAL HIGH (ref 6–20)
CO2: 26 mmol/L (ref 22–32)
Calcium: 9 mg/dL (ref 8.9–10.3)
Chloride: 101 mmol/L (ref 101–111)
Creatinine, Ser: 6.53 mg/dL — ABNORMAL HIGH (ref 0.61–1.24)
GFR calc non Af Amer: 7 mL/min — ABNORMAL LOW (ref >60)
GFR, EST AFRICAN AMERICAN: 9 mL/min — AB (ref >60)
GLUCOSE: 180 mg/dL — AB (ref 65–99)
PHOSPHORUS: 5 mg/dL — AB (ref 2.5–4.6)
POTASSIUM: 4.6 mmol/L (ref 3.5–5.1)
Sodium: 137 mmol/L (ref 135–145)

## 2016-03-04 LAB — PROTIME-INR
INR: 1.17
Prothrombin Time: 15 seconds (ref 11.4–15.2)

## 2016-03-04 LAB — COMPREHENSIVE METABOLIC PANEL
ALT: 17 U/L (ref 17–63)
AST: 16 U/L (ref 15–41)
Albumin: 1.9 g/dL — ABNORMAL LOW (ref 3.5–5.0)
Alkaline Phosphatase: 59 U/L (ref 38–126)
Anion gap: 11 (ref 5–15)
BUN: 50 mg/dL — AB (ref 6–20)
CHLORIDE: 100 mmol/L — AB (ref 101–111)
CO2: 26 mmol/L (ref 22–32)
CREATININE: 6.58 mg/dL — AB (ref 0.61–1.24)
Calcium: 9 mg/dL (ref 8.9–10.3)
GFR calc non Af Amer: 7 mL/min — ABNORMAL LOW (ref >60)
GFR, EST AFRICAN AMERICAN: 9 mL/min — AB (ref >60)
Glucose, Bld: 181 mg/dL — ABNORMAL HIGH (ref 65–99)
Potassium: 4.5 mmol/L (ref 3.5–5.1)
SODIUM: 137 mmol/L (ref 135–145)
Total Bilirubin: 0.4 mg/dL (ref 0.3–1.2)
Total Protein: 6.8 g/dL (ref 6.5–8.1)

## 2016-03-04 LAB — PREPARE RBC (CROSSMATCH)

## 2016-03-04 LAB — HEMOGLOBIN A1C
Hgb A1c MFr Bld: 6.3 % — ABNORMAL HIGH (ref 4.8–5.6)
MEAN PLASMA GLUCOSE: 134 mg/dL

## 2016-03-04 LAB — ABO/RH: ABO/RH(D): A POS

## 2016-03-04 NOTE — Progress Notes (Signed)
[facility]  ROUNDING NOTE   Subjective:  Hemoglobin and crit are down to 6.9. Patient due for hemodialysis today and we plan to administer blood confusion during dialysis. Still has very little urine output.   Objective:  Vital signs in last 24 hours:  Temperature 97.2 pulse 76 respirations 16 blood pressure 101/70  Physical Exam: General: No acute distress  Head: Normocephalic, atraumatic. Moist oral mucosal membranes  Eyes: Anicteric  Neck: Supple, trachea midline  Lungs:  Clear to auscultation, normal effort  Heart: S1S2 no rubs  Abdomen:  Soft, nontender, bowel sounds present  Extremities: Trace peripheral edema.  Neurologic: Nonfocal, moving all four extremities  Skin: No lesions  Access: R IJ permcath    Basic Metabolic Panel:  Recent Labs Lab 02/27/16 0500 02/28/16 0555 03/02/16 0544 03/03/16 0714 03/04/16 0655  NA 133* 135 136 136 137  137  K 4.5 5.1 4.2 4.3 4.5  4.6  CL 97* 98* 101 101 100*  101  CO2 26 26 24 26 26  26   GLUCOSE 125* 145* 141* 123* 181*  180*  BUN 32* 47* 45* 36* 50*  50*  CREATININE 3.63* 5.54* 6.45* 5.19* 6.58*  6.53*  CALCIUM 8.6* 8.8* 8.6*  9.5 8.9 9.0  9.0  PHOS 4.8*  --  5.1*  --  5.0*    Liver Function Tests:  Recent Labs Lab 02/27/16 0500 02/28/16 0555 03/02/16 0544 03/04/16 0655  AST  --  15 15 16   ALT  --  14* 14* 17  ALKPHOS  --  62 61 59  BILITOT  --  0.8 0.5 0.4  PROT  --  7.0 7.2 6.8  ALBUMIN 2.1* 2.1* 2.0* 1.9*  1.9*   No results for input(s): LIPASE, AMYLASE in the last 168 hours. No results for input(s): AMMONIA in the last 168 hours.  CBC:  Recent Labs Lab 02/27/16 0500 02/28/16 0555 03/02/16 0544 03/03/16 0714 03/04/16 0655  WBC 7.0 6.7 5.8 6.3 7.3  NEUTROABS 4.3  --   --  4.0  --   HGB 7.7* 7.6* 7.1* 7.2* 6.9*  HCT 23.8* 23.8* 21.8* 22.4* 21.1*  MCV 94.1 96.0 95.2 94.1 93.8  PLT 227 198 198 226 222    Cardiac Enzymes:  Recent Labs Lab 03/02/16 0544  CKTOTAL 29*     BNP: Invalid input(s): POCBNP  CBG:  Recent Labs Lab 02/26/16 1714 02/26/16 2107 02/27/16 0758 02/27/16 1114 02/27/16 1317  GLUCAP 91 153* 112* 126* 121*    Microbiology: Results for orders placed or performed during the hospital encounter of 02/13/16  Culture, blood (Routine X 2) w Reflex to ID Panel     Status: None   Collection Time: 02/15/16  1:00 PM  Result Value Ref Range Status   Specimen Description BLOOD RIGHT ARM  2 ML IN YELLOW  Final   Special Requests NONE  Final   Culture   Final    NO GROWTH 5 DAYS Performed at [facility]    Report Status 02/20/2016 FINAL  Final  Culture, blood (Routine X 2) w Reflex to ID Panel     Status: None   Collection Time: 02/15/16  1:00 PM  Result Value Ref Range Status   Specimen Description BLOOD LEFT ARM  3 ML IN YELLOW  Final   Special Requests NONE  Final   Culture   Final    NO GROWTH 5 DAYS Performed at [facility]    Report Status 02/20/2016 FINAL  Final  Coagulation Studies:  Recent Labs  03/02/16 0544 03/04/16 0655  LABPROT 13.6 15.0  INR 1.04 1.17    Urinalysis: No results for input(s): COLORURINE, LABSPEC, PHURINE, GLUCOSEU, HGBUR, BILIRUBINUR, KETONESUR, PROTEINUR, UROBILINOGEN, NITRITE, LEUKOCYTESUR in the last 72 hours.  Invalid input(s): APPERANCEUR    Imaging: No results found.   Medications:    Aranesp 60   Assessment/ Plan:  75 y.o. caucasian male with medical problems of DM2, HTN, multiple sclerosis, spastic quadriplegia, urinary retention, h/o decubitus/ischial ulcers, baseline CKD. Recently hospitalized (9/29-10/4) with R ischial pressure ulcer, that was debrided, treated with 6 days of vanco and zosyn (for this plus a klebsiella and strep UTI) Discharged 10/4 (with a creatinine of 1.82)to finish 4 more days of bactrim. [facility] with new onset dyspnea, andhypoxemia. CXR in the ED showed pulmonary edema,  creatinine was noted to be elevated at 3.3 and he was admitted. BNP elevated >900, troponins elevated. ECHO LV EF 35% - 40% (down from 50-55% 09/2014), moderate diffuse hypokinesis, mild to moderate MR and Grade 1 diastolic dysfunction who was transferred to [facility] from [facility] on 02/27/2016 for ongoing management.   1. ARF , oliguric ATN vs bactrim vs Immune complex disease from Osteomyelitis- Biopsy not done because of underlying infections and debility Patient was initially started on CRRT on 10/16 Tunneled dialysis cathter placed on 10/26 -  Creatinine continues to trend up off of dialysis. Patient scheduled for hemodialysis today at which point and we will also administer blood transfusion.  2. CKD st 3/4 at baseline Baseline Cr 1.8 per Prior notes.  3. Anemia of CKD - hemoglobin down to 6.9. Patient will continue on Aranesp however he will also receive 1 unit PRBC during dialysis today.  4. St 4 decubitis and rt ischial Osteomyelitis:  Management per primary team.  5.  Secondary hyperparathyroidism. Phosphorus currently 5.0. Continue to monitor phosphorus as well as calcium.   LOS: 0 Tonny Isensee 11/1/201710:50 AM

## 2016-03-05 LAB — TYPE AND SCREEN
ABO/RH(D): A POS
ANTIBODY SCREEN: NEGATIVE
Unit division: 0

## 2016-03-05 LAB — CBC
HEMATOCRIT: 24.4 % — AB (ref 39.0–52.0)
HEMOGLOBIN: 7.8 g/dL — AB (ref 13.0–17.0)
MCH: 29.5 pg (ref 26.0–34.0)
MCHC: 32 g/dL (ref 30.0–36.0)
MCV: 92.4 fL (ref 78.0–100.0)
Platelets: 218 10*3/uL (ref 150–400)
RBC: 2.64 MIL/uL — AB (ref 4.22–5.81)
RDW: 18 % — ABNORMAL HIGH (ref 11.5–15.5)
WBC: 8 10*3/uL (ref 4.0–10.5)

## 2016-03-06 LAB — PROTIME-INR
INR: 1.15
Prothrombin Time: 14.8 seconds (ref 11.4–15.2)

## 2016-03-06 LAB — COMPREHENSIVE METABOLIC PANEL
ALBUMIN: 1.8 g/dL — AB (ref 3.5–5.0)
ALT: 19 U/L (ref 17–63)
AST: 17 U/L (ref 15–41)
Alkaline Phosphatase: 62 U/L (ref 38–126)
Anion gap: 8 (ref 5–15)
BUN: 41 mg/dL — ABNORMAL HIGH (ref 6–20)
CHLORIDE: 100 mmol/L — AB (ref 101–111)
CO2: 26 mmol/L (ref 22–32)
CREATININE: 5.46 mg/dL — AB (ref 0.61–1.24)
Calcium: 8.7 mg/dL — ABNORMAL LOW (ref 8.9–10.3)
GFR calc non Af Amer: 9 mL/min — ABNORMAL LOW (ref >60)
GFR, EST AFRICAN AMERICAN: 11 mL/min — AB (ref >60)
GLUCOSE: 132 mg/dL — AB (ref 65–99)
Potassium: 4.3 mmol/L (ref 3.5–5.1)
SODIUM: 134 mmol/L — AB (ref 135–145)
Total Bilirubin: 0.4 mg/dL (ref 0.3–1.2)
Total Protein: 6.6 g/dL (ref 6.5–8.1)

## 2016-03-06 LAB — CBC
HCT: 23.8 % — ABNORMAL LOW (ref 39.0–52.0)
Hemoglobin: 7.7 g/dL — ABNORMAL LOW (ref 13.0–17.0)
MCH: 30 pg (ref 26.0–34.0)
MCHC: 32.4 g/dL (ref 30.0–36.0)
MCV: 92.6 fL (ref 78.0–100.0)
PLATELETS: 214 10*3/uL (ref 150–400)
RBC: 2.57 MIL/uL — AB (ref 4.22–5.81)
RDW: 17.2 % — ABNORMAL HIGH (ref 11.5–15.5)
WBC: 8.7 10*3/uL (ref 4.0–10.5)

## 2016-03-06 LAB — RENAL FUNCTION PANEL
ALBUMIN: 1.8 g/dL — AB (ref 3.5–5.0)
ANION GAP: 8 (ref 5–15)
BUN: 41 mg/dL — ABNORMAL HIGH (ref 6–20)
CO2: 26 mmol/L (ref 22–32)
Calcium: 8.8 mg/dL — ABNORMAL LOW (ref 8.9–10.3)
Chloride: 101 mmol/L (ref 101–111)
Creatinine, Ser: 5.32 mg/dL — ABNORMAL HIGH (ref 0.61–1.24)
GFR calc Af Amer: 11 mL/min — ABNORMAL LOW (ref >60)
GFR, EST NON AFRICAN AMERICAN: 10 mL/min — AB (ref >60)
GLUCOSE: 134 mg/dL — AB (ref 65–99)
PHOSPHORUS: 4 mg/dL (ref 2.5–4.6)
POTASSIUM: 4.3 mmol/L (ref 3.5–5.1)
Sodium: 135 mmol/L (ref 135–145)

## 2016-03-06 NOTE — Progress Notes (Signed)
[facility]  ROUNDING NOTE   Subjective:  Patient resting comfortably in bed. He continues to be dialysis dependent. BUN currently 41 with a creatinine of 5.4. He also remains oliguric.  Objective:  Vital signs in last 24 hours:  Temperature 97.7 pulse 67 respirations 14 blood pressure 115/53  Physical Exam: General: No acute distress  Head: Normocephalic, atraumatic. Moist oral mucosal membranes  Eyes: Anicteric  Neck: Supple, trachea midline  Lungs:  Clear to auscultation, normal effort  Heart: S1S2 no rubs  Abdomen:  Soft, nontender, bowel sounds present  Extremities: Trace peripheral edema.  Neurologic: Nonfocal, moving all four extremities  Skin: No lesions  Access: Retta Diones permcath    Basic Metabolic Panel:  Recent Labs Lab 03/02/16 0544 03/03/16 0714 03/04/16 0655 03/06/16 0414  NA 136 136 137  137 134*  135  K 4.2 4.3 4.5  4.6 4.3  4.3  CL 101 101 100*  101 100*  101  CO2 24 26 26  26 26  26   GLUCOSE 141* 123* 181*  180* 132*  134*  BUN 45* 36* 50*  50* 41*  41*  CREATININE 6.45* 5.19* 6.58*  6.53* 5.46*  5.32*  CALCIUM 8.6*  9.5 8.9 9.0  9.0 8.7*  8.8*  PHOS 5.1*  --  5.0* 4.0    Liver Function Tests:  Recent Labs Lab 03/02/16 0544 03/04/16 0655 03/06/16 0414  AST 15 16 17   ALT 14* 17 19  ALKPHOS 61 59 62  BILITOT 0.5 0.4 0.4  PROT 7.2 6.8 6.6  ALBUMIN 2.0* 1.9*  1.9* 1.8*  1.8*   No results for input(s): LIPASE, AMYLASE in the last 168 hours. No results for input(s): AMMONIA in the last 168 hours.  CBC:  Recent Labs Lab 03/02/16 0544 03/03/16 0714 03/04/16 0655 03/05/16 0658 03/06/16 0414  WBC 5.8 6.3 7.3 8.0 8.7  NEUTROABS  --  4.0  --   --   --   HGB 7.1* 7.2* 6.9* 7.8* 7.7*  HCT 21.8* 22.4* 21.1* 24.4* 23.8*  MCV 95.2 94.1 93.8 92.4 92.6  PLT 198 226 222 218 214    Cardiac Enzymes:  Recent Labs Lab 03/02/16 0544  CKTOTAL 29*    BNP: Invalid input(s): POCBNP  CBG: No results for  input(s): GLUCAP in the last 168 hours.  Microbiology: Results for orders placed or performed during the hospital encounter of 02/13/16  Culture, blood (Routine X 2) w Reflex to ID Panel     Status: None   Collection Time: 02/15/16  1:00 PM  Result Value Ref Range Status   Specimen Description BLOOD RIGHT ARM  2 ML IN YELLOW  Final   Special Requests NONE  Final   Culture   Final    NO GROWTH 5 DAYS Performed at [facility]    Report Status 02/20/2016 FINAL  Final  Culture, blood (Routine X 2) w Reflex to ID Panel     Status: None   Collection Time: 02/15/16  1:00 PM  Result Value Ref Range Status   Specimen Description BLOOD LEFT ARM  3 ML IN YELLOW  Final   Special Requests NONE  Final   Culture   Final    NO GROWTH 5 DAYS Performed at [facility]    Report Status 02/20/2016 FINAL  Final    Coagulation Studies:  Recent Labs  03/04/16 0655 03/06/16 0414  LABPROT 15.0 14.8  INR 1.17 1.15    Urinalysis: No results for input(s): COLORURINE,  LABSPEC, PHURINE, GLUCOSEU, HGBUR, BILIRUBINUR, KETONESUR, PROTEINUR, UROBILINOGEN, NITRITE, LEUKOCYTESUR in the last 72 hours.  Invalid input(s): APPERANCEUR    Imaging: No results found.   Medications:    Aranesp 60   Assessment/ Plan:  75 y.o. caucasian male with medical problems of DM2, HTN, multiple sclerosis, spastic quadriplegia, urinary retention, h/o decubitus/ischial ulcers, baseline CKD. Recently hospitalized (9/29-10/4) with R ischial pressure ulcer, that was debrided, treated with 6 days of vanco and zosyn (for this plus a klebsiella and strep UTI) Discharged 10/4 (with a creatinine of 1.82)to finish 4 more days of bactrim. [facility] with new onset dyspnea, andhypoxemia. CXR in the ED showed pulmonary edema, creatinine was noted to be elevated at 3.3 and he was admitted. BNP elevated >900, troponins elevated. ECHO LV EF 35% - 40% (down from  50-55% 09/2014), moderate diffuse hypokinesis, mild to moderate MR and Grade 1 diastolic dysfunction who was transferred to [facility] from [facility] on 02/27/2016 for ongoing management.   1. ARF , oliguric ATN vs bactrim vs Immune complex disease from Osteomyelitis- Biopsy not done because of underlying infections and debility Patient was initially started on CRRT on 10/16 Tunneled dialysis cathter placed on 10/26 -  Oliguria persists.  BUN and creatinine remained high.  Therefore he will need ongoing dialysis at this time.  We will plan for dialysis today as well as on Monday.  2. CKD st 3/4 at baseline Unclear if the patient will achieve his prior baseline creatinine.  Continue to monitor crene trend.  3. Anemia of CKD - continue Aranesp.  Hemoglobin currently 7.7.  Patient received blood transfusion earlier this week as well.  4. St 4 decubitis and rt ischial Osteomyelitis:  Management per primary team.  5.  Secondary hyperparathyroidism. Phosphorus down to 4.0.  Continue to monitor bone mineral metabolism parameters.   LOS: 0 Avraham Benish 11/3/20172:21 PM

## 2016-03-09 LAB — CBC
HCT: 24.7 % — ABNORMAL LOW (ref 39.0–52.0)
Hemoglobin: 8 g/dL — ABNORMAL LOW (ref 13.0–17.0)
MCH: 30.1 pg (ref 26.0–34.0)
MCHC: 32.4 g/dL (ref 30.0–36.0)
MCV: 92.9 fL (ref 78.0–100.0)
PLATELETS: 232 10*3/uL (ref 150–400)
RBC: 2.66 MIL/uL — AB (ref 4.22–5.81)
RDW: 16.3 % — ABNORMAL HIGH (ref 11.5–15.5)
WBC: 7.6 10*3/uL (ref 4.0–10.5)

## 2016-03-09 LAB — RENAL FUNCTION PANEL
ALBUMIN: 2 g/dL — AB (ref 3.5–5.0)
Anion gap: 12 (ref 5–15)
BUN: 54 mg/dL — AB (ref 6–20)
CO2: 25 mmol/L (ref 22–32)
Calcium: 9.1 mg/dL (ref 8.9–10.3)
Chloride: 99 mmol/L — ABNORMAL LOW (ref 101–111)
Creatinine, Ser: 5.93 mg/dL — ABNORMAL HIGH (ref 0.61–1.24)
GFR calc Af Amer: 10 mL/min — ABNORMAL LOW (ref >60)
GFR calc non Af Amer: 8 mL/min — ABNORMAL LOW (ref >60)
GLUCOSE: 148 mg/dL — AB (ref 65–99)
PHOSPHORUS: 5.3 mg/dL — AB (ref 2.5–4.6)
POTASSIUM: 4.2 mmol/L (ref 3.5–5.1)
SODIUM: 136 mmol/L (ref 135–145)

## 2016-03-09 LAB — COMPREHENSIVE METABOLIC PANEL
ALK PHOS: 68 U/L (ref 38–126)
ALT: 17 U/L (ref 17–63)
AST: 15 U/L (ref 15–41)
Albumin: 2 g/dL — ABNORMAL LOW (ref 3.5–5.0)
Anion gap: 13 (ref 5–15)
BILIRUBIN TOTAL: 0.4 mg/dL (ref 0.3–1.2)
BUN: 53 mg/dL — ABNORMAL HIGH (ref 6–20)
CALCIUM: 9 mg/dL (ref 8.9–10.3)
CO2: 23 mmol/L (ref 22–32)
CREATININE: 6.03 mg/dL — AB (ref 0.61–1.24)
Chloride: 99 mmol/L — ABNORMAL LOW (ref 101–111)
GFR, EST AFRICAN AMERICAN: 10 mL/min — AB (ref >60)
GFR, EST NON AFRICAN AMERICAN: 8 mL/min — AB (ref >60)
Glucose, Bld: 151 mg/dL — ABNORMAL HIGH (ref 65–99)
Potassium: 4.2 mmol/L (ref 3.5–5.1)
Sodium: 135 mmol/L (ref 135–145)
Total Protein: 6.7 g/dL (ref 6.5–8.1)

## 2016-03-09 LAB — PROTIME-INR
INR: 1.11
PROTHROMBIN TIME: 14.3 s (ref 11.4–15.2)

## 2016-03-09 LAB — CK: Total CK: 29 U/L — ABNORMAL LOW (ref 49–397)

## 2016-03-09 NOTE — Progress Notes (Signed)
[facility]  ROUNDING NOTE   Subjective:  Patient resting comfortably in bed. He continues to be dialysis dependent. Pre-dialysis BUN currently 53 with a creatinine of 6.03 UOP appears to be improving 350 cc yesterday but about 600 cc in the Foley bag today States he plans to sit on the side of the bed tomottow  Objective:  Vital signs in last 24 hours:  Temperature 98.1  pulse 63 respirations 20 blood pressure 104/54  Physical Exam: General: No acute distress  Head: Normocephalic, atraumatic. Moist oral mucosal membranes  Eyes: Anicteric  Neck: Supple,   Lungs:  Clear to auscultation, normal effort  Heart: S1S2 no rubs  Abdomen:  Soft, nontender, bowel sounds present  Extremities: Trace peripheral edema.  Neurologic: Nonfocal, moving all four extremities  Skin: No lesions  Access: Retta Diones permcath    Basic Metabolic Panel:  Recent Labs Lab 03/03/16 0714 03/04/16 0655 03/06/16 0414 03/09/16 0833  NA 136 137  137 134*  135 135  136  K 4.3 4.5  4.6 4.3  4.3 4.2  4.2  CL 101 100*  101 100*  101 99*  99*  CO2 26 26  26 26  26 23  25   GLUCOSE 123* 181*  180* 132*  134* 151*  148*  BUN 36* 50*  50* 41*  41* 53*  54*  CREATININE 5.19* 6.58*  6.53* 5.46*  5.32* 6.03*  5.93*  CALCIUM 8.9 9.0  9.0 8.7*  8.8* 9.0  9.1  PHOS  --  5.0* 4.0 5.3*    Liver Function Tests:  Recent Labs Lab 03/04/16 0655 03/06/16 0414 03/09/16 0833  AST 16 17 15   ALT 17 19 17   ALKPHOS 59 62 68  BILITOT 0.4 0.4 0.4  PROT 6.8 6.6 6.7  ALBUMIN 1.9*  1.9* 1.8*  1.8* 2.0*  2.0*   No results for input(s): LIPASE, AMYLASE in the last 168 hours. No results for input(s): AMMONIA in the last 168 hours.  CBC:  Recent Labs Lab 03/03/16 0714 03/04/16 0655 03/05/16 0658 03/06/16 0414 03/09/16 0833  WBC 6.3 7.3 8.0 8.7 7.6  NEUTROABS 4.0  --   --   --   --   HGB 7.2* 6.9* 7.8* 7.7* 8.0*  HCT 22.4* 21.1* 24.4* 23.8* 24.7*  MCV 94.1 93.8 92.4 92.6 92.9   PLT 226 222 218 214 232    Cardiac Enzymes:  Recent Labs Lab 03/09/16 0833  CKTOTAL 29*    BNP: Invalid input(s): POCBNP  CBG: No results for input(s): GLUCAP in the last 168 hours.  Microbiology: Results for orders placed or performed during the hospital encounter of 02/13/16  Culture, blood (Routine X 2) w Reflex to ID Panel     Status: None   Collection Time: 02/15/16  1:00 PM  Result Value Ref Range Status   Specimen Description BLOOD RIGHT ARM  2 ML IN YELLOW  Final   Special Requests NONE  Final   Culture   Final    NO GROWTH 5 DAYS Performed at [facility]    Report Status 02/20/2016 FINAL  Final  Culture, blood (Routine X 2) w Reflex to ID Panel     Status: None   Collection Time: 02/15/16  1:00 PM  Result Value Ref Range Status   Specimen Description BLOOD LEFT ARM  3 ML IN YELLOW  Final   Special Requests NONE  Final   Culture   Final    NO GROWTH 5 DAYS Performed at  [facility]    Report Status 02/20/2016 FINAL  Final    Coagulation Studies:  Recent Labs  03/09/16 0833  LABPROT 14.3  INR 1.11    Urinalysis: No results for input(s): COLORURINE, LABSPEC, PHURINE, GLUCOSEU, HGBUR, BILIRUBINUR, KETONESUR, PROTEINUR, UROBILINOGEN, NITRITE, LEUKOCYTESUR in the last 72 hours.  Invalid input(s): APPERANCEUR    Imaging: No results found.   Medications:    Aranesp 60   Assessment/ Plan:  75 y.o. caucasian male with medical problems of DM2, HTN, multiple sclerosis, spastic quadriplegia, urinary retention, h/o decubitus/ischial ulcers, baseline CKD. Recently hospitalized (9/29-10/4) with R ischial pressure ulcer, that was debrided, treated with 6 days of vanco and zosyn (for this plus a klebsiella and strep UTI) Discharged 10/4 (with a creatinine of 1.82)to finish 4 more days of bactrim. [facility] with new onset dyspnea, andhypoxemia. CXR in the ED showed pulmonary edema,  creatinine was noted to be elevated at 3.3 and he was admitted. BNP elevated >900, troponins elevated. ECHO LV EF 35% - 40% (down from 50-55% 09/2014), moderate diffuse hypokinesis, mild to moderate MR and Grade 1 diastolic dysfunction who was transferred to [facility] from [facility] on 02/27/2016 for ongoing management.   1. ARF , oliguric ATN vs bactrim vs Immune complex disease from Osteomyelitis- Biopsy not done because of underlying infections and debility Patient was initially started on CRRT on 10/16 Tunneled dialysis cathter placed on 10/26 -  BUN and creatinine remain high.  Therefore he will need ongoing dialysis at this time.    - Plan for HD on Wednesday - Monitor UOP closely as it appears to be increasing. Minimal UF with dialysis  2. CKD st 3/4 at baseline Unclear if the patient will achieve his prior baseline creatinine.  Continue to monitor crene trend.  3. Anemia of CKD - continue Aranesp.  Hemoglobin currently 8.0.  Patient received blood transfusion this admission as well.  4. St 4 decubitis and rt ischial Osteomyelitis:  Management per primary team.  5.  Secondary hyperparathyroidism. Phosphorus 5.3.  Continue to monitor bone mineral metabolism parameters.   LOS: 0 Haddy Mullinax 11/6/20174:00 PM

## 2016-03-11 LAB — RENAL FUNCTION PANEL
Albumin: 1.9 g/dL — ABNORMAL LOW (ref 3.5–5.0)
Anion gap: 11 (ref 5–15)
BUN: 43 mg/dL — AB (ref 6–20)
CHLORIDE: 100 mmol/L — AB (ref 101–111)
CO2: 25 mmol/L (ref 22–32)
CREATININE: 5.01 mg/dL — AB (ref 0.61–1.24)
Calcium: 8.7 mg/dL — ABNORMAL LOW (ref 8.9–10.3)
GFR calc Af Amer: 12 mL/min — ABNORMAL LOW (ref >60)
GFR, EST NON AFRICAN AMERICAN: 10 mL/min — AB (ref >60)
Glucose, Bld: 86 mg/dL (ref 65–99)
Phosphorus: 5.1 mg/dL — ABNORMAL HIGH (ref 2.5–4.6)
Potassium: 4.3 mmol/L (ref 3.5–5.1)
Sodium: 136 mmol/L (ref 135–145)

## 2016-03-11 LAB — CBC
HCT: 22.2 % — ABNORMAL LOW (ref 39.0–52.0)
Hemoglobin: 7.2 g/dL — ABNORMAL LOW (ref 13.0–17.0)
MCH: 30.1 pg (ref 26.0–34.0)
MCHC: 32.4 g/dL (ref 30.0–36.0)
MCV: 92.9 fL (ref 78.0–100.0)
PLATELETS: 209 10*3/uL (ref 150–400)
RBC: 2.39 MIL/uL — ABNORMAL LOW (ref 4.22–5.81)
RDW: 16.5 % — AB (ref 11.5–15.5)
WBC: 6.9 10*3/uL (ref 4.0–10.5)

## 2016-03-11 NOTE — Progress Notes (Signed)
[facility]  ROUNDING NOTE   Subjective:  Patient resting comfortably in bed. He continues to be dialysis dependent. Pre-dialysis BUN currently 43 with a creatinine of 5.01 UOP appears to be improving 550 cc reported    Objective:  Vital signs in last 24 hours:  Temperature 97.8  pulse 78 respirations 16 blood pressure 139/68  Physical Exam: General: No acute distress  Head: Normocephalic, atraumatic. Moist oral mucosal membranes  Eyes: Anicteric  Neck: Supple,   Lungs:  Clear to auscultation, normal effort  Heart: S1S2 no rubs  Abdomen:  Soft, nontender, bowel sounds present  Extremities: Trace peripheral edema.  Neurologic: Nonfocal, moving all four extremities  Skin: No lesions  Access: R IJ permcath    Basic Metabolic Panel:  Recent Labs Lab 03/06/16 0414 03/09/16 0833 03/11/16 0655  NA 134*  135 135  136 136  K 4.3  4.3 4.2  4.2 4.3  CL 100*  101 99*  99* 100*  CO2 26  26 23  25 25   GLUCOSE 132*  134* 151*  148* 86  BUN 41*  41* 53*  54* 43*  CREATININE 5.46*  5.32* 6.03*  5.93* 5.01*  CALCIUM 8.7*  8.8* 9.0  9.1 8.7*  PHOS 4.0 5.3* 5.1*    Liver Function Tests:  Recent Labs Lab 03/06/16 0414 03/09/16 0833 03/11/16 0655  AST 17 15  --   ALT 19 17  --   ALKPHOS 62 68  --   BILITOT 0.4 0.4  --   PROT 6.6 6.7  --   ALBUMIN 1.8*  1.8* 2.0*  2.0* 1.9*   No results for input(s): LIPASE, AMYLASE in the last 168 hours. No results for input(s): AMMONIA in the last 168 hours.  CBC:  Recent Labs Lab 03/05/16 0658 03/06/16 0414 03/09/16 0833 03/11/16 1549  WBC 8.0 8.7 7.6 6.9  HGB 7.8* 7.7* 8.0* 7.2*  HCT 24.4* 23.8* 24.7* 22.2*  MCV 92.4 92.6 92.9 92.9  PLT 218 214 232 209    Cardiac Enzymes:  Recent Labs Lab 03/09/16 0833  CKTOTAL 29*    BNP: Invalid input(s): POCBNP  CBG: No results for input(s): GLUCAP in the last 168 hours.  Microbiology: Results for orders placed or performed during the  hospital encounter of 02/13/16  Culture, blood (Routine X 2) w Reflex to ID Panel     Status: None   Collection Time: 02/15/16  1:00 PM  Result Value Ref Range Status   Specimen Description BLOOD RIGHT ARM  2 ML IN YELLOW  Final   Special Requests NONE  Final   Culture   Final    NO GROWTH 5 DAYS Performed at [facility]    Report Status 02/20/2016 FINAL  Final  Culture, blood (Routine X 2) w Reflex to ID Panel     Status: None   Collection Time: 02/15/16  1:00 PM  Result Value Ref Range Status   Specimen Description BLOOD LEFT ARM  3 ML IN YELLOW  Final   Special Requests NONE  Final   Culture   Final    NO GROWTH 5 DAYS Performed at [facility]    Report Status 02/20/2016 FINAL  Final    Coagulation Studies:  Recent Labs  03/09/16 0833  LABPROT 14.3  INR 1.11    Urinalysis: No results for input(s): COLORURINE, LABSPEC, PHURINE, GLUCOSEU, HGBUR, BILIRUBINUR, KETONESUR, PROTEINUR, UROBILINOGEN, NITRITE, LEUKOCYTESUR in the last 72 hours.  Invalid input(s): APPERANCEUR    Imaging: No  results found.   Medications:    Aranesp 60   Assessment/ Plan:  76 y.o. caucasian male with medical problems of DM2, HTN, multiple sclerosis, spastic quadriplegia, urinary retention, h/o decubitus/ischial ulcers, baseline CKD. Recently hospitalized (9/29-10/4) with R ischial pressure ulcer, that was debrided, treated with 6 days of vanco and zosyn (for this plus a klebsiella and strep UTI) Discharged 10/4 (with a creatinine of 1.82)to finish 4 more days of bactrim. [facility] with new onset dyspnea, andhypoxemia. CXR in the ED showed pulmonary edema, creatinine was noted to be elevated at 3.3 and he was admitted. BNP elevated >900, troponins elevated. ECHO LV EF 35% - 40% (down from 50-55% 09/2014), moderate diffuse hypokinesis, mild to moderate MR and Grade 1 diastolic dysfunction who was transferred to [facility] from  [facility] on 02/27/2016 for ongoing management.   1. ARF, oliguric ATN vs bactrim vs Immune complex disease from Osteomyelitis- Biopsy not done because of underlying infections and debility Patient was initially started on CRRT on 10/16 Tunneled dialysis cathter placed on 10/26 -  BUN and creatinine remain high.  Therefore he will need ongoing dialysis at this time.    - Plan for HD on MWF schedule - Monitor UOP closely as it appears to be increasing. Minimal UF with dialysis - d/c Fluid restriction  2. CKD st 3/4 at baseline Unclear if the patient will achieve his prior baseline creatinine.  Continue to monitor creatinine trend.  3. Anemia of CKD - continue Aranesp.  Hemoglobin currently 7.2.  Patient received blood transfusion this admission as well. Aranesp 100  4. St 4 decubitis and rt ischial Osteomyelitis:  Management per primary team.  5.  Secondary hyperparathyroidism. Phosphorus 5.1.  Continue to monitor bone mineral metabolism parameters.   LOS: 0 Lance [name] 11/8/20174:17 PM

## 2016-03-13 ENCOUNTER — Encounter (HOSPITAL_BASED_OUTPATIENT_CLINIC_OR_DEPARTMENT_OTHER): Payer: Self-pay

## 2016-03-13 DIAGNOSIS — Z01810 Encounter for preprocedural cardiovascular examination: Secondary | ICD-10-CM

## 2016-03-13 LAB — RENAL FUNCTION PANEL
ALBUMIN: 1.9 g/dL — AB (ref 3.5–5.0)
Anion gap: 9 (ref 5–15)
BUN: 32 mg/dL — AB (ref 6–20)
CALCIUM: 8.6 mg/dL — AB (ref 8.9–10.3)
CO2: 27 mmol/L (ref 22–32)
Chloride: 102 mmol/L (ref 101–111)
Creatinine, Ser: 4.31 mg/dL — ABNORMAL HIGH (ref 0.61–1.24)
GFR calc Af Amer: 14 mL/min — ABNORMAL LOW (ref >60)
GFR, EST NON AFRICAN AMERICAN: 12 mL/min — AB (ref >60)
GLUCOSE: 90 mg/dL (ref 65–99)
PHOSPHORUS: 4.7 mg/dL — AB (ref 2.5–4.6)
POTASSIUM: 4.6 mmol/L (ref 3.5–5.1)
SODIUM: 138 mmol/L (ref 135–145)

## 2016-03-13 LAB — CBC
HEMATOCRIT: 23.2 % — AB (ref 39.0–52.0)
HEMOGLOBIN: 7.5 g/dL — AB (ref 13.0–17.0)
MCH: 30.7 pg (ref 26.0–34.0)
MCHC: 32.3 g/dL (ref 30.0–36.0)
MCV: 95.1 fL (ref 78.0–100.0)
Platelets: 228 10*3/uL (ref 150–400)
RBC: 2.44 MIL/uL — ABNORMAL LOW (ref 4.22–5.81)
RDW: 17.2 % — AB (ref 11.5–15.5)
WBC: 8.2 10*3/uL (ref 4.0–10.5)

## 2016-03-13 NOTE — Progress Notes (Signed)
[facility]  ROUNDING NOTE   Subjective:  Patient resting comfortably in bed. He continues to be dialysis dependent. Pre-dialysis BUN currently 32 with a creatinine of 4.31 UOP still less than optimal 450 cc reported    Objective:  Vital signs in last 24 hours:  Temperature 97.6, pulse 80, respirations 27, blood pressure 140/56  Physical Exam: General: No acute distress  Head: Normocephalic, atraumatic. Moist oral mucosal membranes  Eyes: Anicteric  Neck: Supple,   Lungs:  Clear to auscultation, normal effort  Heart: S1S2 no rubs  Abdomen:  Soft, nontender, bowel sounds present  Extremities: Trace peripheral edema. Unable to move LE  Neurologic: Nonfocal, moving all four extremities  Skin: Heel ulcers, soft boot support, decubitus with wound vac  Access: R IJ permcath    Basic Metabolic Panel:  Recent Labs Lab 03/09/16 0833 03/11/16 0655 03/13/16 0617  NA 135  136 136 138  K 4.2  4.2 4.3 4.6  CL 99*  99* 100* 102  CO2 23  25 25 27   GLUCOSE 151*  148* 86 90  BUN 53*  54* 43* 32*  CREATININE 6.03*  5.93* 5.01* 4.31*  CALCIUM 9.0  9.1 8.7* 8.6*  PHOS 5.3* 5.1* 4.7*    Liver Function Tests:  Recent Labs Lab 03/09/16 0833 03/11/16 0655 03/13/16 0617  AST 15  --   --   ALT 17  --   --   ALKPHOS 68  --   --   BILITOT 0.4  --   --   PROT 6.7  --   --   ALBUMIN 2.0*  2.0* 1.9* 1.9*   No results for input(s): LIPASE, AMYLASE in the last 168 hours. No results for input(s): AMMONIA in the last 168 hours.  CBC:  Recent Labs Lab 03/09/16 0833 03/11/16 1549 03/13/16 0617  WBC 7.6 6.9 8.2  HGB 8.0* 7.2* 7.5*  HCT 24.7* 22.2* 23.2*  MCV 92.9 92.9 95.1  PLT 232 209 228    Cardiac Enzymes:  Recent Labs Lab 03/09/16 0833  CKTOTAL 29*    BNP: Invalid input(s): POCBNP  CBG: No results for input(s): GLUCAP in the last 168 hours.  Microbiology: Results for orders placed or performed during the hospital encounter of 02/13/16   Culture, blood (Routine X 2) w Reflex to ID Panel     Status: None   Collection Time: 02/15/16  1:00 PM  Result Value Ref Range Status   Specimen Description BLOOD RIGHT ARM  2 ML IN YELLOW  Final   Special Requests NONE  Final   Culture   Final    NO GROWTH 5 DAYS Performed at [facility]    Report Status 02/20/2016 FINAL  Final  Culture, blood (Routine X 2) w Reflex to ID Panel     Status: None   Collection Time: 02/15/16  1:00 PM  Result Value Ref Range Status   Specimen Description BLOOD LEFT ARM  3 ML IN YELLOW  Final   Special Requests NONE  Final   Culture   Final    NO GROWTH 5 DAYS Performed at [facility]    Report Status 02/20/2016 FINAL  Final    Coagulation Studies: No results for input(s): LABPROT, INR in the last 72 hours.  Urinalysis: No results for input(s): COLORURINE, LABSPEC, PHURINE, GLUCOSEU, HGBUR, BILIRUBINUR, KETONESUR, PROTEINUR, UROBILINOGEN, NITRITE, LEUKOCYTESUR in the last 72 hours.  Invalid input(s): APPERANCEUR    Imaging: No results found.   Medications:  Aranesp 60   Assessment/ Plan:  75 y.o. caucasian male with medical problems of DM2, HTN, multiple sclerosis, spastic quadriplegia, urinary retention, h/o decubitus/ischial ulcers, baseline CKD. Recently hospitalized (9/29-10/4) with R ischial pressure ulcer, that was debrided, treated with 6 days of vanco and zosyn (for this plus a klebsiella and strep UTI) Discharged 10/4 (with a creatinine of 1.82)to finish 4 more days of bactrim. [facility] with new onset dyspnea, andhypoxemia. CXR in the ED showed pulmonary edema, creatinine was noted to be elevated at 3.3 and he was admitted. BNP elevated >900, troponins elevated. ECHO LV EF 35% - 40% (down from 50-55% 09/2014), moderate diffuse hypokinesis, mild to moderate MR and Grade 1 diastolic dysfunction who was transferred to [facility] from [facility] on 02/27/2016 for ongoing  management.   1. ARF, oliguric ATN vs bactrim vs Immune complex disease from Osteomyelitis- Biopsy not done because of underlying infections and debility Patient was initially started on CRRT on 10/16 Tunneled dialysis cathter placed on 10/26 -  BUN and creatinine remain high.  Therefore he will need ongoing dialysis at this time.  Will consider ESRD Dx at 4-6 weeks of needing dialysis - Plan for HD on MWF schedule - Monitor UOP closely, Last 450 cc  Minimal UF with dialysis - d/c Fluid restriction - disposition will be affected by inability to stand and decubitus ulcer. He is trying to sit at the edge of bed. - We discussed various dialysis modalities and options available to home - Outpatient center; PD at home or HHD- he has thought about hiring help with home dialysis. He will discuss with family - Vein mapping done for AVF  2. CKD st 3/4 at baseline Unclear if the patient will achieve his prior baseline creatinine.  Continue to monitor creatinine trend.  3. Anemia of CKD - continue Aranesp.  Hemoglobin currently 7.5  Patient received blood transfusion this admission as well. Aranesp 100  4. St 4 decubitis and rt ischial Osteomyelitis:  Management per primary team. - wound vac  5.  Secondary hyperparathyroidism. Phosphorus 4.7.  Continue to monitor bone mineral metabolism parameters.   LOS: 0 Lance [name] 11/10/20176:59 PM

## 2016-03-13 NOTE — Progress Notes (Signed)
Right  Upper Extremity Vein Map  Cephalic  Segment Diameter Depth Comment  1. Axilla 1.1 mm 6.9 mm   2. Mid upper arm 2.1 mm 5.1 mm   3. Above AC 2.3 mm 2.8 mm   4. In AC 2.4 mm 5.1 mm   5. Below AC 2.7 mm 3.9 mm   6. Mid forearm 2.2 mm 3.8 mm   7. Wrist 2.5 mm 3 mm    Basilic  Segment Diameter Depth Comment  1. Axilla 4.4 mm 13.6 mm   2. Mid upper arm  3.6 mm 9.5 mm   3. Above AC 4.5 mm 8.8 mm   4. In AC 3.8 mm 7.5 mm   5. Below AC 1.4 mm 2.3 mm   6. Mid forearm 1.2 mm 1.8 mm   7. Wrist 0.9 mm 1.8 mm     Left  Upper Extremity Vein Map  Cephalic  Segment Diameter Depth Comment  1. Axilla 1.7 mm 5.9 mm   2. Mid upper arm 2.5 mm 2.7 mm   3. Above AC 0.1 mm 4.7 mm   4. In AC 0.8 mm 3 mm   5. Below AC 2.2 mm 3.6 mm   6. Mid forearm 2.6 mm 5.7 mm   7. Wrist 2.2 mm 2.5 mm    Basilic  Segment Diameter Depth Comment  1. Axilla 2.5 mm 10.8 mm   2. Mid upper arm 2.5 mm 10 mm   3. Above AC 3.9 mm 6.5 mm   4. In AC 2.1 mm 6.9 mm   5. Below AC 1.9 mm 3.1 mm   6. Mid forearm 1.3 mm 1.8 mm   7. Wrist 1.1 mm 3.5 mm

## 2016-03-16 LAB — CBC
HEMATOCRIT: 23 % — AB (ref 39.0–52.0)
HEMOGLOBIN: 7.3 g/dL — AB (ref 13.0–17.0)
MCH: 30 pg (ref 26.0–34.0)
MCHC: 31.7 g/dL (ref 30.0–36.0)
MCV: 94.7 fL (ref 78.0–100.0)
Platelets: 256 10*3/uL (ref 150–400)
RBC: 2.43 MIL/uL — ABNORMAL LOW (ref 4.22–5.81)
RDW: 16.9 % — ABNORMAL HIGH (ref 11.5–15.5)
WBC: 7.3 10*3/uL (ref 4.0–10.5)

## 2016-03-16 LAB — RENAL FUNCTION PANEL
ALBUMIN: 1.9 g/dL — AB (ref 3.5–5.0)
ANION GAP: 9 (ref 5–15)
BUN: 52 mg/dL — ABNORMAL HIGH (ref 6–20)
CO2: 27 mmol/L (ref 22–32)
Calcium: 8.9 mg/dL (ref 8.9–10.3)
Chloride: 104 mmol/L (ref 101–111)
Creatinine, Ser: 4.87 mg/dL — ABNORMAL HIGH (ref 0.61–1.24)
GFR calc Af Amer: 12 mL/min — ABNORMAL LOW (ref >60)
GFR calc non Af Amer: 11 mL/min — ABNORMAL LOW (ref >60)
GLUCOSE: 100 mg/dL — AB (ref 65–99)
PHOSPHORUS: 4.5 mg/dL (ref 2.5–4.6)
POTASSIUM: 4.1 mmol/L (ref 3.5–5.1)
Sodium: 140 mmol/L (ref 135–145)

## 2016-03-16 NOTE — Progress Notes (Signed)
[facility]  ROUNDING NOTE   Subjective:  verall injury or persistent renal function remains quite poor. Patient resting comfortably in bed. He completed hemodialysis today.    Objective:  Vital signs in last 24 hours:  Temperature 97.5 pulse 70 respirations 15 blood pressure 141/67  Physical Exam: General: No acute distress  Head: Normocephalic, atraumatic. Moist oral mucosal membranes  Eyes: Anicteric  Neck: Supple  Lungs:  Clear to auscultation, normal effort  Heart: S1S2 no rubs  Abdomen:  Soft, nontender, bowel sounds present  Extremities: Trace peripheral edema. Unable to move LE  Neurologic: Nonfocal, moving all four extremities  Skin: Heel ulcers, soft boot support, decubitus with wound vac  Access: R IJ permcath    Basic Metabolic Panel:  Recent Labs Lab 03/11/16 0655 03/13/16 0617 03/16/16 0739  NA 136 138 140  K 4.3 4.6 4.1  CL 100* 102 104  CO2 25 27 27   GLUCOSE 86 90 100*  BUN 43* 32* 52*  CREATININE 5.01* 4.31* 4.87*  CALCIUM 8.7* 8.6* 8.9  PHOS 5.1* 4.7* 4.5    Liver Function Tests:  Recent Labs Lab 03/11/16 0655 03/13/16 0617 03/16/16 0739  ALBUMIN 1.9* 1.9* 1.9*   No results for input(s): LIPASE, AMYLASE in the last 168 hours. No results for input(s): AMMONIA in the last 168 hours.  CBC:  Recent Labs Lab 03/11/16 1549 03/13/16 0617 03/16/16 0738  WBC 6.9 8.2 7.3  HGB 7.2* 7.5* 7.3*  HCT 22.2* 23.2* 23.0*  MCV 92.9 95.1 94.7  PLT 209 228 256    Cardiac Enzymes: No results for input(s): CKTOTAL, CKMB, CKMBINDEX, TROPONINI in the last 168 hours.  BNP: Invalid input(s): POCBNP  CBG: No results for input(s): GLUCAP in the last 168 hours.  Microbiology: Results for orders placed or performed during the hospital encounter of 02/13/16  Culture, blood (Routine X 2) w Reflex to ID Panel     Status: None   Collection Time: 02/15/16  1:00 PM  Result Value Ref Range Status   Specimen Description BLOOD RIGHT ARM  2  ML IN YELLOW  Final   Special Requests NONE  Final   Culture   Final    NO GROWTH 5 DAYS Performed at [facility]    Report Status 02/20/2016 FINAL  Final  Culture, blood (Routine X 2) w Reflex to ID Panel     Status: None   Collection Time: 02/15/16  1:00 PM  Result Value Ref Range Status   Specimen Description BLOOD LEFT ARM  3 ML IN YELLOW  Final   Special Requests NONE  Final   Culture   Final    NO GROWTH 5 DAYS Performed at [facility]    Report Status 02/20/2016 FINAL  Final    Coagulation Studies: No results for input(s): LABPROT, INR in the last 72 hours.  Urinalysis: No results for input(s): COLORURINE, LABSPEC, PHURINE, GLUCOSEU, HGBUR, BILIRUBINUR, KETONESUR, PROTEINUR, UROBILINOGEN, NITRITE, LEUKOCYTESUR in the last 72 hours.  Invalid input(s): APPERANCEUR    Imaging: No results found.   Medications:    Aranesp 60   Assessment/ Plan:  75 y.o. caucasian male with medical problems of DM2, HTN, multiple sclerosis, spastic quadriplegia, urinary retention, h/o decubitus/ischial ulcers, baseline CKD. Recently hospitalized (9/29-10/4) with R ischial pressure ulcer, that was debrided, treated with 6 days of vanco and zosyn (for this plus a klebsiella and strep UTI) Discharged 10/4 (with a creatinine of 1.82)to finish 4 more days of bactrim. [facility] with new onset dyspnea, andhypoxemia. CXR in the ED showed pulmonary edema, creatinine was noted to be elevated at 3.3 and he was admitted. BNP elevated >900, troponins elevated. ECHO LV EF 35% - 40% (down from 50-55% 09/2014), moderate diffuse hypokinesis, mild to moderate MR and Grade 1 diastolic dysfunction who was transferred to [facility] from [facility] on 02/27/2016 for ongoing management.   1. ARF, oliguric ATN vs bactrim vs Immune complex disease from Osteomyelitis- Biopsy not done because of underlying infections and debility Patient was initially  started on CRRT on 10/16 Tunneled dialysis cathter placed on 10/26 -  Overall renal function remains quite poor. BUN 52 with a creatinine of 4.8. Patient does not have much muscle mass. We will plan for hemodialysis again on Wednesday. The issue remains as to whether he would be able to sit for 4 hours for hemodialysis. We will begin to attempt this over the course of this week.  2. CKD st 3/4 at baseline Unclear if the patient will achieve his prior baseline creatinine.  Continue to monitor creatinine trend.  3. Anemia of CKD - overall hemoglobin remains low at 7.3. Continue Aranesp to treat his underlying anemia.  4. St 4 decubitis and rt ischial Osteomyelitis:  Management per primary team. - wound vac  5.  Secondary hyperparathyroidism. Phosphorus rechecked this morning and acceptable at 4.5. Continue to monitor bone mineral metabolism parameters.  LOS: 0 Saud Bail 11/13/20174:10 PM

## 2016-03-18 DIAGNOSIS — N185 Chronic kidney disease, stage 5: Secondary | ICD-10-CM

## 2016-03-18 LAB — RENAL FUNCTION PANEL
ALBUMIN: 1.9 g/dL — AB (ref 3.5–5.0)
ANION GAP: 9 (ref 5–15)
BUN: 46 mg/dL — ABNORMAL HIGH (ref 6–20)
CO2: 28 mmol/L (ref 22–32)
Calcium: 8.7 mg/dL — ABNORMAL LOW (ref 8.9–10.3)
Chloride: 101 mmol/L (ref 101–111)
Creatinine, Ser: 4.36 mg/dL — ABNORMAL HIGH (ref 0.61–1.24)
GFR, EST AFRICAN AMERICAN: 14 mL/min — AB (ref >60)
GFR, EST NON AFRICAN AMERICAN: 12 mL/min — AB (ref >60)
Glucose, Bld: 131 mg/dL — ABNORMAL HIGH (ref 65–99)
PHOSPHORUS: 4.7 mg/dL — AB (ref 2.5–4.6)
POTASSIUM: 4.1 mmol/L (ref 3.5–5.1)
Sodium: 138 mmol/L (ref 135–145)

## 2016-03-18 LAB — CK: CK TOTAL: 18 U/L — AB (ref 49–397)

## 2016-03-18 LAB — CBC
HEMATOCRIT: 22.4 % — AB (ref 39.0–52.0)
HEMOGLOBIN: 7.4 g/dL — AB (ref 13.0–17.0)
MCH: 31.2 pg (ref 26.0–34.0)
MCHC: 33 g/dL (ref 30.0–36.0)
MCV: 94.5 fL (ref 78.0–100.0)
Platelets: 233 10*3/uL (ref 150–400)
RBC: 2.37 MIL/uL — ABNORMAL LOW (ref 4.22–5.81)
RDW: 17.4 % — ABNORMAL HIGH (ref 11.5–15.5)
WBC: 7.6 10*3/uL (ref 4.0–10.5)

## 2016-03-18 LAB — C-REACTIVE PROTEIN: CRP: 5.6 mg/dL — ABNORMAL HIGH (ref <1.0)

## 2016-03-18 LAB — SEDIMENTATION RATE: SED RATE: 130 mm/h — AB (ref 0–16)

## 2016-03-18 NOTE — Consult Note (Signed)
[facility]  Patient name: Lance [name] MRN: [id number] [name]: 18-Sep-1940 Sex: male  REASON FOR CONSULT: permanent dialysis access, consult is from Dr. Candiss Norse  HPI: Lance [name] is a 75 y.o. male, who presents for evaluation for permanent dialysis. The patient is known to [facility] from having undergone recent right IJ tunneled dialysis catheter placement on 02/27/16 by Bridgett Larsson. He was recently admitted on 02/13/16 for new onset dyspnea and hypoxemia. He was found to have acute cardiomyopathy with acute respiratory failure requiring bipap. He developed worsening renal function requiring CRRT.   The patient has multiple sclerosis resulting in spastic quadriplegia since 2005. Other medical problems include decubitus ulcer stage IV and right ischial osteomyelitis until 03/30/16. He currently has a wound vac to his decubitus ulcer.  He has a chronic foley catheter with recurrent UTI. He has type II diabetes on insulin. He is a former smoker.   Past Medical History:  Diagnosis Date  . Acute CHF (congestive heart failure) ([place]) 02/14/2016  . Acute osteomyelitis, pelvis, right ([place])   . Acute renal failure ([place])   . Anemia in chronic renal disease   . Blood transfusion   . Cardiomyopathy ([place]) 02/17/2016  . Chronic spastic paraplegia (H[facility])    secondary to MS  . CKD (chronic kidney disease), stage III   . Decreased sensation of lower extremity    due to MS  . Decubitus ulcer of ischium   . Decubitus ulcer of trochanter, stage 4 ([place]) 06/14/2015  . History of MRSA infection    urine  . History of recurrent UTIs   . Hypertension   . MS (multiple sclerosis) ([place])   . Neuralgia neuritis, sciatic nerve    MS for 30 years  . Neurogenic bladder   . PONV (postoperative nausea and vomiting)   . Self-catheterizes urinary bladder   . Type 2 diabetes mellitus (H[facility])     Family History  Problem Relation Age of Onset  . Stroke Father   . Diabetes Father   .  Diabetes Paternal Grandmother   . Stroke Paternal Grandfather   . Heart disease Neg Hx   . Cancer Neg Hx     SOCIAL HISTORY: Social History   Social History  . Marital status: Married    Spouse name: N/A  . Number of children: N/A  . Years of education: N/A   Occupational History  . Not on file.   Social History Main Topics  . Smoking status: Former Smoker    Types: Cigars    Quit date: 05/06/1983  . Smokeless tobacco: Never Used  . Alcohol use 1.8 oz/week    3 Glasses of wine per week     Comment: 3 glasses of wine per week  . Drug use: No  . Sexual activity: Not Currently    Birth control/ protection: None   Other Topics Concern  . Not on file   Social History Narrative  . No narrative on file    No Known Allergies  No current facility-administered medications for this encounter.     REVIEW OF SYSTEMS:  [X]  denotes positive finding, [ ]  denotes negative finding Cardiac  Comments:  Chest pain or chest pressure:    Shortness of breath upon exertion:    Short of breath when lying flat:    Irregular heart rhythm:        Vascular    Pain in calf, thigh, or hip brought on by ambulation:    Pain  in feet at night that wakes you up from your sleep:     Blood clot in your veins:    Leg swelling:         Pulmonary    Oxygen at home:    Productive cough:     Wheezing:         Neurologic    Sudden weakness in arms or legs:     Sudden numbness in arms or legs:     Sudden onset of difficulty speaking or slurred speech:    Temporary loss of vision in one eye:     Problems with dizziness:         Gastrointestinal    Blood in stool:     Vomited blood:         Genitourinary    Burning when urinating:     Blood in urine:        Psychiatric    Major depression:         Hematologic    Bleeding problems:    Problems with blood clotting too easily:        Skin    Rashes or ulcers:        Constitutional    Fever or chills:      PHYSICAL EXAM: There  were no vitals filed for this visit.  GENERAL: The patient is a well-nourished male, in no acute distress. The vital signs are documented above. CARDIAC: regular VASCULAR: 2+ radial and brachial pulses bilaterally.  PULMONARY: Non labored respiratory effort.  MUSCULOSKELETAL: atrophic bilateral lower extremities PSYCHIATRIC: The patient has a normal affect.  DATA:  Vein Mapping 03/13/16   RUE: Adequate right basilic vein 3.8 MP-5.3 mm. Small right cephalic vein. LUE: Marginal left basilic vein. Small left cephalic vein.   MEDICAL ISSUES:  CKD stage 4  The patient is currently on dialysis via right IJ tunneled dialysis catheter. He has an adequate vein conduit for right basilic vein transposition. Discussed that this will be done in two stages. He is currently receiving VAC therapy to his decubitus ulcer with daptomycin for right ischial osteomyelitis. He has been afebrile without leukocytosis. We will plan for first stage basilic vein transposition sometime next week.    Virgina Jock, PA-C [facility]   I agree with the above.  Will plan for right arm BVT next week on Tuesday  Lance [name]

## 2016-03-18 NOTE — Progress Notes (Signed)
[facility]  ROUNDING NOTE   Subjective:  Patient completed hemodialysis today. Overall creatinine remains high at 4.36. Still has significant decubitus ulcer.    Objective:  Vital signs in last 24 hours:  emperature 97.4 pulse 70 respirations 18 blood pressure 112/51  Physical Exam: General: No acute distress  Head: Normocephalic, atraumatic. Moist oral mucosal membranes  Eyes: Anicteric  Neck: Supple  Lungs:  Clear to auscultation, normal effort  Heart: S1S2 no rubs  Abdomen:  Soft, nontender, bowel sounds present  Extremities: Trace peripheral edema. Unable to move LE  Neurologic: Nonfocal, moving all four extremities  Skin: Heel ulcers, soft boot support, decubitus with wound vac  Access: R IJ permcath    Basic Metabolic Panel:  Recent Labs Lab 03/13/16 0617 03/16/16 0739 03/18/16 0555  NA 138 140 138  K 4.6 4.1 4.1  CL 102 104 101  CO2 27 27 28   GLUCOSE 90 100* 131*  BUN 32* 52* 46*  CREATININE 4.31* 4.87* 4.36*  CALCIUM 8.6* 8.9 8.7*  PHOS 4.7* 4.5 4.7*    Liver Function Tests:  Recent Labs Lab 03/13/16 0617 03/16/16 0739 03/18/16 0555  ALBUMIN 1.9* 1.9* 1.9*   No results for input(s): LIPASE, AMYLASE in the last 168 hours. No results for input(s): AMMONIA in the last 168 hours.  CBC:  Recent Labs Lab 03/13/16 0617 03/16/16 0738 03/18/16 0555  WBC 8.2 7.3 7.6  HGB 7.5* 7.3* 7.4*  HCT 23.2* 23.0* 22.4*  MCV 95.1 94.7 94.5  PLT 228 256 233    Cardiac Enzymes:  Recent Labs Lab 03/18/16 0555  CKTOTAL 18*    BNP: Invalid input(s): POCBNP  CBG: No results for input(s): GLUCAP in the last 168 hours.  Microbiology: Results for orders placed or performed during the hospital encounter of 02/13/16  Culture, blood (Routine X 2) w Reflex to ID Panel     Status: None   Collection Time: 02/15/16  1:00 PM  Result Value Ref Range Status   Specimen Description BLOOD RIGHT ARM  2 ML IN YELLOW  Final   Special Requests NONE   Final   Culture   Final    NO GROWTH 5 DAYS Performed at [facility]    Report Status 02/20/2016 FINAL  Final  Culture, blood (Routine X 2) w Reflex to ID Panel     Status: None   Collection Time: 02/15/16  1:00 PM  Result Value Ref Range Status   Specimen Description BLOOD LEFT ARM  3 ML IN YELLOW  Final   Special Requests NONE  Final   Culture   Final    NO GROWTH 5 DAYS Performed at [facility]    Report Status 02/20/2016 FINAL  Final    Coagulation Studies: No results for input(s): LABPROT, INR in the last 72 hours.  Urinalysis: No results for input(s): COLORURINE, LABSPEC, PHURINE, GLUCOSEU, HGBUR, BILIRUBINUR, KETONESUR, PROTEINUR, UROBILINOGEN, NITRITE, LEUKOCYTESUR in the last 72 hours.  Invalid input(s): APPERANCEUR    Imaging: No results found.   Medications:    Aranesp 60   Assessment/ Plan:  75 y.o. caucasian male with medical problems of DM2, HTN, multiple sclerosis, spastic quadriplegia, urinary retention, h/o decubitus/ischial ulcers, baseline CKD. Recently hospitalized (9/29-10/4) with R ischial pressure ulcer, that was debrided, treated with 6 days of vanco and zosyn (for this plus a klebsiella and strep UTI) Discharged 10/4 (with a creatinine of 1.82)to finish 4 more days of bactrim. [facility] with new onset dyspnea, andhypoxemia. CXR in the ED showed pulmonary edema, creatinine was noted to be elevated at 3.3 and he was admitted. BNP elevated >900, troponins elevated. ECHO LV EF 35% - 40% (down from 50-55% 09/2014), moderate diffuse hypokinesis, mild to moderate MR and Grade 1 diastolic dysfunction who was transferred to [facility] from [facility] on 02/27/2016 for ongoing management.   1. ARF, oliguric ATN vs bactrim vs Immune complex disease from Osteomyelitis- Biopsy not done because of underlying infections and debility Patient was initially started on CRRT on 10/16 Tunneled dialysis  cathter placed on 10/26 -  Creatinine today was 4.36. Therefore we will need to continue hemodialysis at the moment on a Monday, Wednesday, Friday schedule. Next also Friday.  2. CKD st 3/4 at baseline Unclear if the patient will achieve his prior baseline creatinine.  Continue to monitor creatinine trend.  3. Anemia of CKD - hemoglobin currently 7.4.Continue Aranesp 100 mcg subcutaneous weekly.  4. St 4 decubitis and rt ischial Osteomyelitis:  Management per primary team. - wound vac  5.  Secondary hyperparathyroidism.  Phosphorus currently 4.7 and acceptable. Patient is not on binder therapy at the moment.    LOS: 0 Lance [name] 11/15/20175:35 PM

## 2016-03-20 LAB — RENAL FUNCTION PANEL
Albumin: 2.1 g/dL — ABNORMAL LOW (ref 3.5–5.0)
Anion gap: 9 (ref 5–15)
BUN: 34 mg/dL — AB (ref 6–20)
CHLORIDE: 101 mmol/L (ref 101–111)
CO2: 25 mmol/L (ref 22–32)
Calcium: 8.9 mg/dL (ref 8.9–10.3)
Creatinine, Ser: 3.89 mg/dL — ABNORMAL HIGH (ref 0.61–1.24)
GFR calc Af Amer: 16 mL/min — ABNORMAL LOW (ref >60)
GFR calc non Af Amer: 14 mL/min — ABNORMAL LOW (ref >60)
GLUCOSE: 112 mg/dL — AB (ref 65–99)
POTASSIUM: 3.9 mmol/L (ref 3.5–5.1)
Phosphorus: 4.1 mg/dL (ref 2.5–4.6)
Sodium: 135 mmol/L (ref 135–145)

## 2016-03-20 LAB — CBC
HEMATOCRIT: 23.2 % — AB (ref 39.0–52.0)
Hemoglobin: 7.6 g/dL — ABNORMAL LOW (ref 13.0–17.0)
MCH: 31 pg (ref 26.0–34.0)
MCHC: 32.8 g/dL (ref 30.0–36.0)
MCV: 94.7 fL (ref 78.0–100.0)
Platelets: 238 10*3/uL (ref 150–400)
RBC: 2.45 MIL/uL — ABNORMAL LOW (ref 4.22–5.81)
RDW: 17.3 % — AB (ref 11.5–15.5)
WBC: 7.2 10*3/uL (ref 4.0–10.5)

## 2016-03-20 NOTE — Progress Notes (Signed)
[facility]  ROUNDING NOTE   Subjective:  Patient seen and evaluated during hemodialysis. He appears to be tolerating this well. We discussed setting up for dialysis sometime next week.    Objective:  Vital signs in last 24 hours:  Pulse 74 respirations 18 pulse ox 98% afebrile  Physical Exam: General: No acute distress  Head: Normocephalic, atraumatic. Moist oral mucosal membranes  Eyes: Anicteric  Neck: Supple  Lungs:  Clear to auscultation, normal effort  Heart: S1S2 no rubs  Abdomen:  Soft, nontender, bowel sounds present  Extremities: Trace peripheral edema. Unable to move LE  Neurologic: Nonfocal, moving all four extremities  Skin: Heel ulcers, soft boot support, decubitus with wound vac  Access: R IJ permcath    Basic Metabolic Panel:  Recent Labs Lab 03/16/16 0739 03/18/16 0555 03/20/16 0552  NA 140 138 135  K 4.1 4.1 3.9  CL 104 101 101  CO2 27 28 25   GLUCOSE 100* 131* 112*  BUN 52* 46* 34*  CREATININE 4.87* 4.36* 3.89*  CALCIUM 8.9 8.7* 8.9  PHOS 4.5 4.7* 4.1    Liver Function Tests:  Recent Labs Lab 03/16/16 0739 03/18/16 0555 03/20/16 0552  ALBUMIN 1.9* 1.9* 2.1*   No results for input(s): LIPASE, AMYLASE in the last 168 hours. No results for input(s): AMMONIA in the last 168 hours.  CBC:  Recent Labs Lab 03/16/16 0738 03/18/16 0555 03/20/16 0552  WBC 7.3 7.6 7.2  HGB 7.3* 7.4* 7.6*  HCT 23.0* 22.4* 23.2*  MCV 94.7 94.5 94.7  PLT 256 233 238    Cardiac Enzymes:  Recent Labs Lab 03/18/16 0555  CKTOTAL 18*    BNP: Invalid input(s): POCBNP  CBG: No results for input(s): GLUCAP in the last 168 hours.  Microbiology: Results for orders placed or performed during the hospital encounter of 02/13/16  Culture, blood (Routine X 2) w Reflex to ID Panel     Status: None   Collection Time: 02/15/16  1:00 PM  Result Value Ref Range Status   Specimen Description BLOOD RIGHT ARM  2 ML IN YELLOW  Final   Special  Requests NONE  Final   Culture   Final    NO GROWTH 5 DAYS Performed at [facility]    Report Status 02/20/2016 FINAL  Final  Culture, blood (Routine X 2) w Reflex to ID Panel     Status: None   Collection Time: 02/15/16  1:00 PM  Result Value Ref Range Status   Specimen Description BLOOD LEFT ARM  3 ML IN YELLOW  Final   Special Requests NONE  Final   Culture   Final    NO GROWTH 5 DAYS Performed at [facility]    Report Status 02/20/2016 FINAL  Final    Coagulation Studies: No results for input(s): LABPROT, INR in the last 72 hours.  Urinalysis: No results for input(s): COLORURINE, LABSPEC, PHURINE, GLUCOSEU, HGBUR, BILIRUBINUR, KETONESUR, PROTEINUR, UROBILINOGEN, NITRITE, LEUKOCYTESUR in the last 72 hours.  Invalid input(s): APPERANCEUR    Imaging: No results found.   Medications:    Aranesp 60   Assessment/ Plan:  75 y.o. caucasian male with medical problems of DM2, HTN, multiple sclerosis, spastic quadriplegia, urinary retention, h/o decubitus/ischial ulcers, baseline CKD. Recently hospitalized (9/29-10/4) with R ischial pressure ulcer, that was debrided, treated with 6 days of vanco and zosyn (for this plus a klebsiella and strep UTI) Discharged 10/4 (with a creatinine of 1.82)to finish 4 more days of bactrim. [facility] with new onset dyspnea, andhypoxemia. CXR in the ED showed pulmonary edema, creatinine was noted to be elevated at 3.3 and he was admitted. BNP elevated >900, troponins elevated. ECHO LV EF 35% - 40% (down from 50-55% 09/2014), moderate diffuse hypokinesis, mild to moderate MR and Grade 1 diastolic dysfunction who was transferred to [facility] from [facility] on 02/27/2016 for ongoing management.   1. ARF, oliguric ATN vs bactrim vs Immune complex disease from Osteomyelitis- Biopsy not done because of underlying infections and debility Patient was initially started on CRRT on  10/16 Tunneled dialysis cathter placed on 10/26 -  Patient seen and evaluated during hemodialysis. He appears to be tolerating this well. Still making very little urine. He may end up having ESRD status.  2. CKD st 3/4 at baseline Unclear if the patient will achieve his prior baseline creatinine.  Continue to monitor creatinine trend.  3. Anemia of CKD - hemoglobin currently 7.6. Continue Aranesp as ordered.  4. St 4 decubitis and rt ischial Osteomyelitis:  Management per primary team. - wound vac and local wound care per hospitalist.  5.  Secondary hyperparathyroidism.  Phosphorus 4.1, calcium 8.9, both acceptable. Continue to monitor her bone mineral metabolism parameters.    LOS: 0 Lance [name] 11/17/20175:11 PM

## 2016-03-22 LAB — RENAL FUNCTION PANEL
ANION GAP: 9 (ref 5–15)
Albumin: 2 g/dL — ABNORMAL LOW (ref 3.5–5.0)
BUN: 36 mg/dL — AB (ref 6–20)
CHLORIDE: 100 mmol/L — AB (ref 101–111)
CO2: 28 mmol/L (ref 22–32)
Calcium: 9 mg/dL (ref 8.9–10.3)
Creatinine, Ser: 3.73 mg/dL — ABNORMAL HIGH (ref 0.61–1.24)
GFR calc Af Amer: 17 mL/min — ABNORMAL LOW (ref >60)
GFR calc non Af Amer: 15 mL/min — ABNORMAL LOW (ref >60)
GLUCOSE: 130 mg/dL — AB (ref 65–99)
POTASSIUM: 3.9 mmol/L (ref 3.5–5.1)
Phosphorus: 4.7 mg/dL — ABNORMAL HIGH (ref 2.5–4.6)
Sodium: 137 mmol/L (ref 135–145)

## 2016-03-22 LAB — CBC
HEMATOCRIT: 24.5 % — AB (ref 39.0–52.0)
HEMOGLOBIN: 7.8 g/dL — AB (ref 13.0–17.0)
MCH: 30.4 pg (ref 26.0–34.0)
MCHC: 31.8 g/dL (ref 30.0–36.0)
MCV: 95.3 fL (ref 78.0–100.0)
Platelets: 263 10*3/uL (ref 150–400)
RBC: 2.57 MIL/uL — ABNORMAL LOW (ref 4.22–5.81)
RDW: 17.2 % — AB (ref 11.5–15.5)
WBC: 7.6 10*3/uL (ref 4.0–10.5)

## 2016-03-23 LAB — HEPATITIS B SURFACE ANTIGEN: HEP B S AG: NEGATIVE

## 2016-03-23 NOTE — Progress Notes (Signed)
[facility]  ROUNDING NOTE   Subjective:  Patient had hemodialysis yesterday given thanks giving schedule. He is due for dialysis again tomorrow. He is also having fistula placement tomorrow.    Objective:  Vital signs in last 24 hours:  Temperature 98.4 pulse 71 respirations 17 blood pressure 139/59  Physical Exam: General: No acute distress  Head: Normocephalic, atraumatic. Moist oral mucosal membranes  Eyes: Anicteric  Neck: Supple  Lungs:  Clear to auscultation, normal effort  Heart: S1S2 no rubs  Abdomen:  Soft, nontender, bowel sounds present  Extremities: Trace peripheral edema. Unable to move LE  Neurologic: Nonfocal, moving all four extremities  Skin: Heel ulcers, soft boot support, decubitus with wound vac  Access: R IJ permcath    Basic Metabolic Panel:  Recent Labs Lab 03/18/16 0555 03/20/16 0552 03/22/16 0648  NA 138 135 137  K 4.1 3.9 3.9  CL 101 101 100*  CO2 28 25 28   GLUCOSE 131* 112* 130*  BUN 46* 34* 36*  CREATININE 4.36* 3.89* 3.73*  CALCIUM 8.7* 8.9 9.0  PHOS 4.7* 4.1 4.7*    Liver Function Tests:  Recent Labs Lab 03/18/16 0555 03/20/16 0552 03/22/16 0648  ALBUMIN 1.9* 2.1* 2.0*   No results for input(s): LIPASE, AMYLASE in the last 168 hours. No results for input(s): AMMONIA in the last 168 hours.  CBC:  Recent Labs Lab 03/18/16 0555 03/20/16 0552 03/22/16 0648  WBC 7.6 7.2 7.6  HGB 7.4* 7.6* 7.8*  HCT 22.4* 23.2* 24.5*  MCV 94.5 94.7 95.3  PLT 233 238 263    Cardiac Enzymes:  Recent Labs Lab 03/18/16 0555  CKTOTAL 18*    BNP: Invalid input(s): POCBNP  CBG: No results for input(s): GLUCAP in the last 168 hours.  Microbiology: Results for orders placed or performed during the hospital encounter of 02/13/16  Culture, blood (Routine X 2) w Reflex to ID Panel     Status: None   Collection Time: 02/15/16  1:00 PM  Result Value Ref Range Status   Specimen Description BLOOD RIGHT ARM  2 ML IN YELLOW   Final   Special Requests NONE  Final   Culture   Final    NO GROWTH 5 DAYS Performed at [facility]    Report Status 02/20/2016 FINAL  Final  Culture, blood (Routine X 2) w Reflex to ID Panel     Status: None   Collection Time: 02/15/16  1:00 PM  Result Value Ref Range Status   Specimen Description BLOOD LEFT ARM  3 ML IN YELLOW  Final   Special Requests NONE  Final   Culture   Final    NO GROWTH 5 DAYS Performed at [facility]    Report Status 02/20/2016 FINAL  Final    Coagulation Studies: No results for input(s): LABPROT, INR in the last 72 hours.  Urinalysis: No results for input(s): COLORURINE, LABSPEC, PHURINE, GLUCOSEU, HGBUR, BILIRUBINUR, KETONESUR, PROTEINUR, UROBILINOGEN, NITRITE, LEUKOCYTESUR in the last 72 hours.  Invalid input(s): APPERANCEUR    Imaging: No results found.   Medications:    Aranesp 60   Assessment/ Plan:  75 y.o. caucasian male with medical problems of DM2, HTN, multiple sclerosis, spastic quadriplegia, urinary retention, h/o decubitus/ischial ulcers, baseline CKD. Recently hospitalized (9/29-10/4) with R ischial pressure ulcer, that was debrided, treated with 6 days of vanco and zosyn (for this plus a klebsiella and strep UTI) Discharged 10/4 (with a creatinine of 1.82)to finish 4 more days of bactrim. [facility] with new onset dyspnea, andhypoxemia. CXR in the ED showed pulmonary edema, creatinine was noted to be elevated at 3.3 and he was admitted. BNP elevated >900, troponins elevated. ECHO LV EF 35% - 40% (down from 50-55% 09/2014), moderate diffuse hypokinesis, mild to moderate MR and Grade 1 diastolic dysfunction who was transferred to [facility] from [facility] on 02/27/2016 for ongoing management.   1. ARF, oliguric ATN vs bactrim vs Immune complex disease from Osteomyelitis- Biopsy not done because of underlying infections and debility Patient was initially started on CRRT  on 10/16 Tunneled dialysis cathter placed on 10/26 -  Patient due for hemodialysis again tomorrow given Thanksgiving schedule. We will prepare her orders.  Fistula to be placed tomorrow as well.  2. CKD st 3/4 at baseline t does not appear that the patient has recovered enough renal function to be off of dialysis. See plan above.  3. Anemia of CKD - emoglobin up slightly to 7.8. Continue Aranesp as ordered.  4. St 4 decubitis and rt ischial Osteomyelitis:  Management per primary team. - wound vac and local wound care per hospitalist.  5.  Secondary hyperparathyroidism.  Phosphorus up slightly to 4.7. Continue to monitor bone metabolism parameters periodically.    LOS: 0 Raeshawn Tafolla 11/20/20175:29 PM

## 2016-03-24 ENCOUNTER — Encounter: Payer: Self-pay | Admitting: Anesthesiology

## 2016-03-24 ENCOUNTER — Encounter
Admission: AD | Disposition: A | Discharge status: Skilled Nursing Facility | Payer: Self-pay | Source: Ambulatory Visit | Attending: Internal Medicine

## 2016-03-24 ENCOUNTER — Encounter (HOSPITAL_COMMUNITY): Payer: Self-pay | Admitting: Anesthesiology

## 2016-03-24 HISTORY — PX: BASCILIC VEIN TRANSPOSITION: SHX5742

## 2016-03-24 LAB — RENAL FUNCTION PANEL
Albumin: 2.1 g/dL — ABNORMAL LOW (ref 3.5–5.0)
Anion gap: 13 (ref 5–15)
BUN: 44 mg/dL — ABNORMAL HIGH (ref 6–20)
CHLORIDE: 100 mmol/L — AB (ref 101–111)
CO2: 21 mmol/L — AB (ref 22–32)
CREATININE: 4.22 mg/dL — AB (ref 0.61–1.24)
Calcium: 8.5 mg/dL — ABNORMAL LOW (ref 8.9–10.3)
GFR calc non Af Amer: 13 mL/min — ABNORMAL LOW (ref >60)
GFR, EST AFRICAN AMERICAN: 15 mL/min — AB (ref >60)
Glucose, Bld: 122 mg/dL — ABNORMAL HIGH (ref 65–99)
Phosphorus: 6.1 mg/dL — ABNORMAL HIGH (ref 2.5–4.6)
Potassium: 4.8 mmol/L (ref 3.5–5.1)
Sodium: 134 mmol/L — ABNORMAL LOW (ref 135–145)

## 2016-03-24 LAB — C-REACTIVE PROTEIN: CRP: 4.1 mg/dL — AB (ref <1.0)

## 2016-03-24 LAB — PTH, INTACT AND CALCIUM
CALCIUM TOTAL (PTH): 9 mg/dL (ref 8.6–10.2)
PTH: 29 pg/mL (ref 15–65)

## 2016-03-24 LAB — POCT I-STAT 4, (NA,K, GLUC, HGB,HCT)
Glucose, Bld: 106 mg/dL — ABNORMAL HIGH (ref 65–99)
HEMATOCRIT: 23 % — AB (ref 39.0–52.0)
HEMOGLOBIN: 7.8 g/dL — AB (ref 13.0–17.0)
Potassium: 3.9 mmol/L (ref 3.5–5.1)
SODIUM: 139 mmol/L (ref 135–145)

## 2016-03-24 LAB — CBC
HEMATOCRIT: 22.7 % — AB (ref 39.0–52.0)
Hemoglobin: 7.2 g/dL — ABNORMAL LOW (ref 13.0–17.0)
MCH: 30.5 pg (ref 26.0–34.0)
MCHC: 31.7 g/dL (ref 30.0–36.0)
MCV: 96.2 fL (ref 78.0–100.0)
Platelets: 249 10*3/uL (ref 150–400)
RBC: 2.36 MIL/uL — ABNORMAL LOW (ref 4.22–5.81)
RDW: 17.5 % — AB (ref 11.5–15.5)
WBC: 5.8 10*3/uL (ref 4.0–10.5)

## 2016-03-24 LAB — SEDIMENTATION RATE: SED RATE: 126 mm/h — AB (ref 0–16)

## 2016-03-24 LAB — CK: Total CK: 26 U/L — ABNORMAL LOW (ref 49–397)

## 2016-03-24 SURGERY — TRANSPOSITION, VEIN, BASILIC
Anesthesia: General | Site: Arm Lower | Laterality: Right

## 2016-03-24 MED ORDER — LIDOCAINE 2% (20 MG/ML) 5 ML SYRINGE
INTRAMUSCULAR | Status: AC
  Filled 2016-03-24: qty 5

## 2016-03-24 MED ORDER — 0.9 % SODIUM CHLORIDE (POUR BTL) OPTIME
TOPICAL | Status: DC | PRN
  Administered 2016-03-24: 1000 mL

## 2016-03-24 MED ORDER — SODIUM CHLORIDE 0.9 % IV SOLN
INTRAVENOUS | Status: DC | PRN
  Administered 2016-03-24: 07:00:00 via INTRAVENOUS

## 2016-03-24 MED ORDER — ONDANSETRON HCL 4 MG/2ML IJ SOLN
INTRAMUSCULAR | Status: AC
  Filled 2016-03-24: qty 2

## 2016-03-24 MED ORDER — PROPOFOL 10 MG/ML IV BOLUS
INTRAVENOUS | Status: DC | PRN
  Administered 2016-03-24: 120 mg via INTRAVENOUS

## 2016-03-24 MED ORDER — ONDANSETRON HCL 4 MG/2ML IJ SOLN
INTRAMUSCULAR | Status: DC | PRN
  Administered 2016-03-24: 4 mg via INTRAVENOUS

## 2016-03-24 MED ORDER — FENTANYL CITRATE (PF) 100 MCG/2ML IJ SOLN
INTRAMUSCULAR | Status: AC
  Filled 2016-03-24: qty 2

## 2016-03-24 MED ORDER — CEFAZOLIN IN D5W 1 GM/50ML IV SOLN
INTRAVENOUS | Status: DC | PRN
  Administered 2016-03-24: 1 g via INTRAVENOUS

## 2016-03-24 MED ORDER — LIDOCAINE HCL (PF) 1 % IJ SOLN
INTRAMUSCULAR | Status: DC | PRN
  Administered 2016-03-24: 30 mL

## 2016-03-24 MED ORDER — FENTANYL CITRATE (PF) 100 MCG/2ML IJ SOLN
INTRAMUSCULAR | Status: DC | PRN
  Administered 2016-03-24 (×2): 50 ug via INTRAVENOUS

## 2016-03-24 MED ORDER — MIDAZOLAM HCL 2 MG/2ML IJ SOLN
INTRAMUSCULAR | Status: AC
  Filled 2016-03-24: qty 2

## 2016-03-24 MED ORDER — MIDAZOLAM HCL 5 MG/5ML IJ SOLN
INTRAMUSCULAR | Status: DC | PRN
  Administered 2016-03-24: 0.5 mg via INTRAVENOUS
  Administered 2016-03-24: 1 mg via INTRAVENOUS
  Administered 2016-03-24: 0.5 mg via INTRAVENOUS

## 2016-03-24 MED ORDER — LIDOCAINE HCL (PF) 1 % IJ SOLN
INTRAMUSCULAR | Status: AC
  Filled 2016-03-24: qty 30

## 2016-03-24 MED ORDER — SODIUM CHLORIDE 0.9 % IV SOLN
INTRAVENOUS | Status: DC | PRN
  Administered 2016-03-24: 500 mL

## 2016-03-24 MED ORDER — PROPOFOL 10 MG/ML IV BOLUS
INTRAVENOUS | Status: AC
  Filled 2016-03-24: qty 20

## 2016-03-24 MED ORDER — LIDOCAINE HCL (CARDIAC) 20 MG/ML IV SOLN
INTRAVENOUS | Status: DC | PRN
  Administered 2016-03-24: 60 mg via INTRAVENOUS

## 2016-03-24 MED ORDER — DEXTROSE 5 % IV SOLN
INTRAVENOUS | Status: DC | PRN
  Administered 2016-03-24: 25 ug/min via INTRAVENOUS

## 2016-03-24 SURGICAL SUPPLY — 39 items
ARMBAND PINK RESTRICT EXTREMIT (MISCELLANEOUS) ×3 IMPLANT
CANISTER SUCTION 2500CC (MISCELLANEOUS) ×3 IMPLANT
CATH EMB 2FR 60CM (CATHETERS) ×3 IMPLANT
CLIP TI MEDIUM 24 (CLIP) ×3 IMPLANT
CLIP TI WIDE RED SMALL 24 (CLIP) ×3 IMPLANT
CORDS BIPOLAR (ELECTRODE) IMPLANT
COVER PROBE W GEL 5X96 (DRAPES) ×3 IMPLANT
DECANTER SPIKE VIAL GLASS SM (MISCELLANEOUS) ×3 IMPLANT
DERMABOND ADVANCED (GAUZE/BANDAGES/DRESSINGS) ×2
DERMABOND ADVANCED .7 DNX12 (GAUZE/BANDAGES/DRESSINGS) ×1 IMPLANT
ELECT REM PT RETURN 9FT ADLT (ELECTROSURGICAL) ×3
ELECTRODE REM PT RTRN 9FT ADLT (ELECTROSURGICAL) ×1 IMPLANT
GLOVE BIO SURGEON STRL SZ 6.5 (GLOVE) ×2 IMPLANT
GLOVE BIO SURGEON STRL SZ7 (GLOVE) ×3 IMPLANT
GLOVE BIO SURGEONS STRL SZ 6.5 (GLOVE) ×1
GLOVE BIOGEL PI IND STRL 6.5 (GLOVE) ×2 IMPLANT
GLOVE BIOGEL PI IND STRL 7.5 (GLOVE) ×1 IMPLANT
GLOVE BIOGEL PI INDICATOR 6.5 (GLOVE) ×4
GLOVE BIOGEL PI INDICATOR 7.5 (GLOVE) ×2
GLOVE ECLIPSE 6.5 STRL STRAW (GLOVE) ×3 IMPLANT
GOWN STRL REUS W/ TWL LRG LVL3 (GOWN DISPOSABLE) ×4 IMPLANT
GOWN STRL REUS W/TWL LRG LVL3 (GOWN DISPOSABLE) ×8
HEMOSTAT SPONGE AVITENE ULTRA (HEMOSTASIS) IMPLANT
KIT BASIN OR (CUSTOM PROCEDURE TRAY) ×3 IMPLANT
KIT ROOM TURNOVER OR (KITS) ×3 IMPLANT
NS IRRIG 1000ML POUR BTL (IV SOLUTION) ×3 IMPLANT
PACK CV ACCESS (CUSTOM PROCEDURE TRAY) ×3 IMPLANT
PAD ARMBOARD 7.5X6 YLW CONV (MISCELLANEOUS) ×6 IMPLANT
SUT MNCRL AB 4-0 PS2 18 (SUTURE) ×3 IMPLANT
SUT PROLENE 6 0 BV (SUTURE) ×3 IMPLANT
SUT PROLENE 7 0 BV 1 (SUTURE) ×9 IMPLANT
SUT SILK 2 0 SH (SUTURE) IMPLANT
SUT VIC AB 2-0 CT1 27 (SUTURE)
SUT VIC AB 2-0 CT1 TAPERPNT 27 (SUTURE) IMPLANT
SUT VIC AB 3-0 SH 27 (SUTURE) ×4
SUT VIC AB 3-0 SH 27X BRD (SUTURE) ×2 IMPLANT
SYR TB 1ML LUER SLIP (SYRINGE) ×3 IMPLANT
UNDERPAD 30X30 (UNDERPADS AND DIAPERS) ×3 IMPLANT
WATER STERILE IRR 1000ML POUR (IV SOLUTION) ×3 IMPLANT

## 2016-03-24 NOTE — Op Note (Addendum)
OPERATIVE NOTE   PROCEDURE: right radiocephaic arteriovenous fistula placement  PRE-OPERATIVE DIAGNOSIS: end stage renal disease-HD: M/W/F   POST-OPERATIVE DIAGNOSIS: same as above   SURGEON: Adele Barthel, MD  ASSISTANT(S): Silva Bandy, PAC   ANESTHESIA: general  ESTIMATED BLOOD LOSS: 50 cc  FINDING(S): 1.  Adequate forearm cephalic vein for attempt at radiocephalic arteriovenous fistula  2.  Forearm cephalic vein: 4.0-0.8 mm 3.  Radial artery (2.5 mm) pulsatile with some atherosclerotic disease evident in wall 4.  Weak thrill at end of case  5.  Dopplerable radial signal at end of case  SPECIMEN(S):  none  INDICATIONS:   Lance [name] is a 75 y.o. male who presents with end stage renal disease-HD: M/W/F .  The patient is scheduled for right first stage basilic vein transposition.  The patient is aware the risks include but are not limited to: bleeding, infection, steal syndrome, nerve damage, ischemic monomelic neuropathy, failure to mature, and need for additional procedures.  He was aware of the need for additional operation to perform a transposition of the matured fistula.  The patient is aware of the risks of the procedure and elects to proceed forward.  DESCRIPTION: After full informed written consent was obtained from the patient, the patient was brought back to the operating room and placed supine upon the operating table.  Prior to induction, the patient received IV antibiotics.   After obtaining adequate anesthesia, the patient was then prepped and draped in the standard fashion for a right arm access procedure.  I turned my attention first to identifying the patient's basilic vein and brachial artery under ultrasound guidance.  In this process, I found a reasonable cephalic vein draining into the brachial vein system.  In tracing the vein distally, I found the forearm cephalic vein to be adequate for access use.  I marked out the distal cephalic vein and radial  artery.  At this point, I elected to change the case to a left radiocephalic arteriovenous fistula to try to avoid needing a second operation.  At this point, I injected local anesthetic to obtain a field block of the wrist.  In total, I injected about 5 mL 1% lidocaine without epinephrine.  I made a longitudinal  incision at the level of the wrist and dissected through the subcutaneous tissue and fascia to gain exposure of the radial artery.  This was noted to be 2.5 mm in diameter externally with a strong pulse.  This was dissected out proximally and distally and controlled with vessel loops .  I then dissected out the cephalic vein.  This was noted to be 2.5-3.5 mm in diameter externally.  The distal segment of the vein was ligated with a  2-0 silk, and the vein was transected.  I passed a 2 Fogarty proximally in the vein, rendering some valves incompetent.  The vein appeared to be >4 mm in diameter externally.  I then instilled the heparinized saline into the vein and clamped it.  At this point, I reset my exposure of the radial artery and placed the artery under tension proximally and distally.  I made an arteriotomy with a #11 blade, and then I extended the arteriotomy with a Potts scissor.  I injected heparinized saline proximal and distal to this arteriotomy.  The vein was then sewn to the artery in an end-to-side configuration with a running stitch of 7-0 Prolene.  Prior to completing this anastomosis, I allowed the vein and artery to backbleed.  There was  no evidence of clot from any vessels.  I completed the anastomosis in the usual fashion and then released all vessel loops and clamps.  There was a faintly palpable thrill in the venous outflow, and there was a dopplerable radial signal.  At this point, I irrigated out the surgical wound.  There was no further active bleeding.  The subcutaneous tissue was reapproximated with a running stitch of 3-0 Vicryl.  The skin was then reapproximated with a  running subcuticular stitch of 4-0 Vicryl.  The skin was then cleaned, dried, and reinforced with Dermabond.  The patient tolerated this procedure well.    COMPLICATIONS: none  CONDITION: stable   Adele Barthel, MD, [facility] [place] Office: [phone] Pager: [phone]  03/24/2016, 9:28 AM

## 2016-03-24 NOTE — Anesthesia Postprocedure Evaluation (Signed)
Anesthesia Post Note  Patient: Lance [name]  Procedure(s) Performed: Procedure(s) (LRB): RADIAL- CEPHALIC FISTULA CREATION RIGHT ARM (Right)  Patient location during evaluation: PACU Anesthesia Type: General Level of consciousness: awake and alert Pain management: pain level controlled Vital Signs Assessment: post-procedure vital signs reviewed and stable Respiratory status: spontaneous breathing, nonlabored ventilation, respiratory function stable and patient connected to nasal cannula oxygen Cardiovascular status: blood pressure returned to baseline and stable Postop Assessment: no signs of nausea or vomiting Anesthetic complications: no    Last Vitals:  Vitals:   03/24/16 0955 03/24/16 1005  BP:  110/60  Pulse: (!) 57 (!) 58  Resp: 12   Temp: 36.4 C     Last Pain: There were no vitals filed for this visit.               Tiajuana Amass

## 2016-03-24 NOTE — H&P (View-Only) (Signed)
[facility] [place]  Patient name: Lance [name] MRN: [id number] [name]: 1940/09/10 Sex: male  REASON FOR CONSULT: dialysis catheter, consult is from Dr. Joelyn Oms.   HPI: Lance [name] is a 74 y.o. male with AKI on CKD3.  We are being asked to place a TDC. He was admitted secondary to new onset dyspnea andhypoxemia. CXR in the ED showed pulmonary edema, creatinine was noted to be elevated at 3.3 and he was admitted. BNP elevated >900, troponins elevated. ECHO shows LV EF 35% - 40% (down from 50-55% 09/2014).   A right femoral vein 20 cm Trialysis temp HD catheter was placed.  He has been on CRRT.    PMH significant for DM2, HTN, multiple sclerosis, spastic quadriplegia, urinary retention, h/o decubitus ulcers/ischial osteo currently being treated with daptomycin  Left(2016 with MRSA bacteremia treated with 7 weeks of vancomycin).      Past Medical History:  Diagnosis Date  . Acute CHF (congestive heart failure) ([place]) 02/14/2016  . Acute osteomyelitis, pelvis, right ([place])   . Acute renal failure ([place])   . Anemia in chronic renal disease   . Blood transfusion   . Cardiomyopathy ([place]) 02/17/2016  . Chronic spastic paraplegia (HCC)    secondary to MS  . CKD (chronic kidney disease), stage III   . Decreased sensation of lower extremity    due to MS  . Decubitus ulcer of ischium   . Decubitus ulcer of trochanter, stage 4 ([place]) 06/14/2015  . History of MRSA infection    urine  . History of recurrent UTIs   . Hypertension   . MS (multiple sclerosis) ([place])   . Neuralgia neuritis, sciatic nerve    MS for 30 years  . Neurogenic bladder   . PONV (postoperative nausea and vomiting)   . Self-catheterizes urinary bladder   . Type 2 diabetes mellitus (HCC)     Family History  Problem Relation Age of Onset  . Stroke Father   . Diabetes Father   . Diabetes Paternal Grandmother   . Stroke Paternal Grandfather   . Heart disease Neg Hx   . Cancer Neg Hx      SOCIAL HISTORY: Social History   Social History  . Marital status: Married    Spouse name: N/A  . Number of children: N/A  . Years of education: N/A   Occupational History  . Not on file.   Social History Main Topics  . Smoking status: Former Smoker    Types: Cigars    Quit date: 05/06/1983  . Smokeless tobacco: Never Used  . Alcohol use 1.8 oz/week    3 Glasses of wine per week     Comment: 3 glasses of wine per week  . Drug use: No  . Sexual activity: Not Currently    Birth control/ protection: None   Other Topics Concern  . Not on file   Social History Narrative  . No narrative on file    No Known Allergies  Current Facility-Administered Medications  Medication Dose Route Frequency Provider Last Rate Last Dose  . acetaminophen (TYLENOL) tablet 650 mg  650 mg Oral Q6H PRN Edwin Dada, MD   650 mg at 02/17/16 1142   Or  . acetaminophen (TYLENOL) suppository 650 mg  650 mg Rectal Q6H PRN Edwin Dada, MD      . ALPRAZolam Duanne Moron) tablet 0.25 mg  0.25 mg Oral TID PRN Eileen Stanford, PA-C   0.25 mg at 02/17/16 1139  .  aspirin chewable tablet 81 mg  81 mg Oral Daily Berton Mount, RPH   81 mg at 02/26/16 1009  . bisacodyl (DULCOLAX) suppository 10 mg  10 mg Rectal Daily PRN Edwin Dada, MD      . carvedilol (COREG) tablet 6.25 mg  6.25 mg Oral BID WC Roney Jaffe, MD   6.25 mg at 02/26/16 1009  . collagenase (SANTYL) ointment   Topical Daily Edwin Dada, MD      . DAPTOmycin (CUBICIN) 600 mg in sodium chloride 0.9 % IVPB  600 mg Intravenous Q M,W,F-1800 Rolla Flatten, [facility]      . feeding supplement (NEPRO CARB STEADY) liquid 237 mL  237 mL Oral TID BM Barton Dubois, MD   237 mL at 02/26/16 1011  . heparin injection 5,000 Units  5,000 Units Subcutaneous Q8H Nishant Dhungel, MD   5,000 Units at 02/26/16 0559  . HYDROcodone-acetaminophen (NORCO/VICODIN) 5-325 MG per tablet 1-2 tablet  1-2 tablet Oral Q4H PRN Edwin Dada, MD      . insulin aspart (novoLOG) injection 0-5 Units  0-5 Units Subcutaneous QHS Edwin Dada, MD   0 Units at 02/23/16 2138  . insulin aspart (novoLOG) injection 0-9 Units  0-9 Units Subcutaneous TID WC Edwin Dada, MD   2 Units at 02/26/16 1008  . insulin detemir (LEVEMIR) injection 10 Units  10 Units Subcutaneous Daily Edwin Dada, MD   10 Units at 02/26/16 1121  . MEDLINE mouth rinse  15 mL Mouth Rinse q12n4p Edwin Dada, MD   15 mL at 02/26/16 1200  . multivitamin with minerals tablet 1 tablet  1 tablet Oral Daily Clanford Marisa Hua, MD   1 tablet at 02/26/16 1009  . ondansetron (ZOFRAN) tablet 4 mg  4 mg Oral Q6H PRN Edwin Dada, MD       Or  . ondansetron (ZOFRAN) injection 4 mg  4 mg Intravenous Q6H PRN Edwin Dada, MD   4 mg at 02/17/16 1203  . oxybutynin (DITROPAN) tablet 5 mg  5 mg Oral BID Edwin Dada, MD   5 mg at 02/26/16 1009  . polyethylene glycol (MIRALAX / GLYCOLAX) packet 17 g  17 g Oral Daily PRN Nishant Dhungel, MD   17 g at 02/25/16 6160    REVIEW OF SYSTEMS:  [X]  denotes positive finding, [ ]  denotes negative finding Cardiac  Comments:  Chest pain or chest pressure:    Shortness of breath upon exertion:    Short of breath when lying flat:    Irregular heart rhythm:        Vascular    Pain in calf, thigh, or hip brought on by ambulation:    Pain in feet at night that wakes you up from your sleep:     Blood clot in your veins:    Leg swelling:         Pulmonary    Oxygen at home:    Productive cough:     Wheezing:         Neurologic    Sudden weakness in arms or legs:     Sudden numbness in arms or legs:     Sudden onset of difficulty speaking or slurred speech:    Temporary loss of vision in one eye:     Problems with dizziness:         Gastrointestinal    Blood in stool:     Vomited blood:  Genitourinary    Burning when urinating:     Blood in urine:          Psychiatric    Major depression:         Hematologic    Bleeding problems:    Problems with blood clotting too easily:        Skin    Rashes or ulcers:        Constitutional    Fever or chills:      PHYSICAL EXAM: Vitals:   02/25/16 1754 02/25/16 2135 02/26/16 0617 02/26/16 0938  BP: (!) 125/58 (!) 122/51 (!) 147/59 125/61  Pulse: 81 80 81 77  Resp: 17 17 18 18   Temp: 98 F (36.7 C) 98.6 F (37 C) 98.3 F (36.8 C) 98.3 F (36.8 C)  TempSrc: Oral Oral Oral Oral  SpO2: 100% 98% 97% 95%  Weight:  170 lb 13.7 oz (77.5 kg)    Height:        GENERAL: The patient is alert and oriented male, in no acute distress. The patient is seen in HD.The vital signs are documented above. VASCULAR: 2+ radial pulses and 2+ dorsalis pedis pulses bilaterally. Right femoral dialysis catheter PULMONARY: Non labored respiratory effort.  MUSCULOSKELETAL: Atrophy bilateral lower extremities.  NEUROLOGIC: No focal weakness or paresthesias are detected. SKIN: There are no ulcers or rashes noted. PSYCHIATRIC: The patient has a normal affect.  MEDICAL ISSUES: Acute on chronic kidney disease stage 3  The patient is currently dialyzing via a right femoral temporary catheter. Plan for tunneled dialysis catheter placement tomorrow with Dr. Bridgett Larsson.    Virgina Jock, PA-C [facility] [place] [phone]  I have independently interviewed patient and agree with PA assessment and plan above. [facility] tomorrow. If he will need long term dialysis will need to get vein mapping as well. NPO at midnight. Discussed risks and benefits with patient and he agrees to proceed with one of my partners tomorrow.   Valdez Brannan C. Donzetta Matters, MD [facility] [place] Office: [phone] Pager: [phone]

## 2016-03-24 NOTE — Anesthesia Preprocedure Evaluation (Signed)
Anesthesia Evaluation  Patient identified by MRN, date of birth, ID band Patient awake    History of Anesthesia Complications (+) history of anesthetic complications  Airway Mallampati: II   Neck ROM: Full    Dental  (+) Teeth Intact, Dental Advisory Given   Pulmonary former smoker,    breath sounds clear to auscultation       Cardiovascular hypertension, +CHF   Rhythm:Regular Rate:Normal     Neuro/Psych  Neuromuscular disease    GI/Hepatic   Endo/Other  diabetes  Renal/GU Renal InsufficiencyRenal disease     Musculoskeletal   Abdominal   Peds  Hematology  (+) anemia ,   Anesthesia Other Findings   Reproductive/Obstetrics                             Lab Results  Component Value Date   WBC 7.6 03/22/2016   HGB 7.8 (L) 03/22/2016   HCT 24.5 (L) 03/22/2016   MCV 95.3 03/22/2016   PLT 263 03/22/2016   Lab Results  Component Value Date   CREATININE 3.73 (H) 03/22/2016   BUN 36 (H) 03/22/2016   NA 137 03/22/2016   K 3.9 03/22/2016   CL 100 (L) 03/22/2016   CO2 28 03/22/2016    Anesthesia Physical  Anesthesia Plan  ASA: III  Anesthesia Plan: General   Post-op Pain Management:    Induction: Intravenous  Airway Management Planned: LMA  Additional Equipment:   Intra-op Plan:   Post-operative Plan: Extubation in OR  Informed Consent:   Plan Discussed with:   Anesthesia Plan Comments:         Anesthesia Quick Evaluation

## 2016-03-24 NOTE — Anesthesia Procedure Notes (Signed)
Procedure Name: LMA Insertion Date/Time: 03/24/2016 8:03 AM Performed by: Greggory Stallion, Nivek Powley L Pre-anesthesia Checklist: Patient identified, Emergency Drugs available, Suction available and Patient being monitored Patient Re-evaluated:Patient Re-evaluated prior to inductionOxygen Delivery Method: Circle System Utilized Preoxygenation: Pre-oxygenation with 100% oxygen Intubation Type: IV induction Ventilation: Mask ventilation without difficulty LMA: LMA inserted LMA Size: 4.0 Number of attempts: 1 Placement Confirmation: positive ETCO2 and breath sounds checked- equal and bilateral Tube secured with: Tape Dental Injury: Teeth and Oropharynx as per pre-operative assessment

## 2016-03-24 NOTE — Transfer of Care (Signed)
Immediate Anesthesia Transfer of Care Note  Patient: Lance [name]  Procedure(s) Performed: Procedure(s): RADIAL- CEPHALIC FISTULA CREATION RIGHT ARM (Right)  Patient Location: PACU  Anesthesia Type:General  Level of Consciousness: awake, alert  and oriented  Airway & Oxygen Therapy: Patient Spontanous Breathing and Patient connected to nasal cannula oxygen  Post-op Assessment: Report given to RN and Post -op Vital signs reviewed and stable  Post vital signs: Reviewed and stable  Last Vitals: There were no vitals filed for this visit.  Last Pain: There were no vitals filed for this visit.       Complications: No apparent anesthesia complications

## 2016-03-24 NOTE — Interval H&P Note (Signed)
[facility] [place]  History and Physical Update  The patient was interviewed and re-examined.  The patient's previous History and Physical has been reviewed and is unchanged from Dr. [name] consult except for: interval TDC placement.  There is no change in the plan of care: R 1st stage BVT.   Risk, benefits, and alternatives to access surgery were discussed.    The patient is aware the risks include but are not limited to: bleeding, infection, steal syndrome, nerve damage, ischemic monomelic neuropathy, failure to mature, need for additional procedures, death and stroke.    The patient is aware is scheduled for a STAGED BVT, so a second operation will be necessary.  The patient agrees to proceed forward with the procedure.   Adele Barthel, MD [facility] [place] Office: [phone] Pager: [phone]  03/24/2016, 7:16 AM

## 2016-03-25 ENCOUNTER — Encounter (HOSPITAL_COMMUNITY): Payer: Self-pay | Admitting: Vascular Surgery

## 2016-03-25 LAB — HEMOGLOBIN AND HEMATOCRIT, BLOOD
HCT: 22.6 % — ABNORMAL LOW (ref 39.0–52.0)
Hemoglobin: 7.3 g/dL — ABNORMAL LOW (ref 13.0–17.0)

## 2016-03-25 NOTE — Progress Notes (Signed)
  Postoperative hemodialysis access     Date of Surgery:  03/24/16 Surgeon: Bridgett Larsson  Subjective:  No complaints  PHYSICAL EXAMINATION:  Vitals:   03/24/16 0955 03/24/16 1005  BP:  110/60  Pulse: (!) 57 (!) 58  Resp: 12   Temp: 97.5 F (36.4 C)     Incision is clean and dry Sensation in digits is intact;  There is  Thrill  There is bruit. The graft/fistula is palpable    ASSESSMENT/PLAN:  Lance [name] is a 75 y.o. year old male who is s/p right radial cephalic AVF.  -graft/fistula is patent -pt does not have evidence of steal sx -f/u with Dr. Bridgett Larsson in 4-6 weeks to check maturation of AVF -will sign off-call as needed.   Leontine Locket, PA-C [facility] [phone]

## 2016-03-25 NOTE — Progress Notes (Signed)
[facility]  ROUNDING NOTE   Subjective:  Patient seen and evaluated at bedside. He was able to sit approximately for 4 hours today in a chair. This is a good sign.    Objective:  Vital signs in last 24 hours:  Temperature 97.7 pulse 70 respirations 18 blood pressure 126/66  Physical Exam: General: No acute distress  Head: Normocephalic, atraumatic. Moist oral mucosal membranes  Eyes: Anicteric  Neck: Supple  Lungs:  Clear to auscultation, normal effort  Heart: S1S2 no rubs  Abdomen:  Soft, nontender, bowel sounds present  Extremities: Trace peripheral edema. Unable to move LE  Neurologic: Nonfocal, moving all four extremities  Skin: Heel ulcers, soft boot support, decubitus with wound vac  Access: R IJ permcath    Basic Metabolic Panel:  Recent Labs Lab 03/20/16 0552 03/22/16 0648 03/23/16 0624 03/24/16 0736 03/24/16 1708  NA 135 137  --  139 134*  K 3.9 3.9  --  3.9 4.8  CL 101 100*  --   --  100*  CO2 25 28  --   --  21*  GLUCOSE 112* 130*  --  106* 122*  BUN 34* 36*  --   --  44*  CREATININE 3.89* 3.73*  --   --  4.22*  CALCIUM 8.9 9.0 9.0  --  8.5*  PHOS 4.1 4.7*  --   --  6.1*    Liver Function Tests:  Recent Labs Lab 03/20/16 0552 03/22/16 0648 03/24/16 1708  ALBUMIN 2.1* 2.0* 2.1*   No results for input(s): LIPASE, AMYLASE in the last 168 hours. No results for input(s): AMMONIA in the last 168 hours.  CBC:  Recent Labs Lab 03/20/16 0552 03/22/16 0648 03/24/16 0736 03/24/16 1708 03/25/16 0732  WBC 7.2 7.6  --  5.8  --   HGB 7.6* 7.8* 7.8* 7.2* 7.3*  HCT 23.2* 24.5* 23.0* 22.7* 22.6*  MCV 94.7 95.3  --  96.2  --   PLT 238 263  --  249  --     Cardiac Enzymes:  Recent Labs Lab 03/24/16 1708  CKTOTAL 26*    BNP: Invalid input(s): POCBNP  CBG: No results for input(s): GLUCAP in the last 168 hours.  Microbiology: Results for orders placed or performed during the hospital encounter of 02/13/16  Culture, blood  (Routine X 2) w Reflex to ID Panel     Status: None   Collection Time: 02/15/16  1:00 PM  Result Value Ref Range Status   Specimen Description BLOOD RIGHT ARM  2 ML IN YELLOW  Final   Special Requests NONE  Final   Culture   Final    NO GROWTH 5 DAYS Performed at [facility]    Report Status 02/20/2016 FINAL  Final  Culture, blood (Routine X 2) w Reflex to ID Panel     Status: None   Collection Time: 02/15/16  1:00 PM  Result Value Ref Range Status   Specimen Description BLOOD LEFT ARM  3 ML IN YELLOW  Final   Special Requests NONE  Final   Culture   Final    NO GROWTH 5 DAYS Performed at [facility]    Report Status 02/20/2016 FINAL  Final    Coagulation Studies: No results for input(s): LABPROT, INR in the last 72 hours.  Urinalysis: No results for input(s): COLORURINE, LABSPEC, PHURINE, GLUCOSEU, HGBUR, BILIRUBINUR, KETONESUR, PROTEINUR, UROBILINOGEN, NITRITE, LEUKOCYTESUR in the last 72 hours.  Invalid input(s): APPERANCEUR    Imaging: No  results found.   Medications:    Aranesp 60   Assessment/ Plan:  75 y.o. caucasian male with medical problems of DM2, HTN, multiple sclerosis, spastic quadriplegia, urinary retention, h/o decubitus/ischial ulcers, baseline CKD. Recently hospitalized (9/29-10/4) with R ischial pressure ulcer, that was debrided, treated with 6 days of vanco and zosyn (for this plus a klebsiella and strep UTI) Discharged 10/4 (with a creatinine of 1.82)to finish 4 more days of bactrim. [facility] new onset dyspnea, andhypoxemia. CXR in the ED showed pulmonary edema, creatinine was noted to be elevated at 3.3 and he was admitted. BNP elevated >900, troponins elevated. ECHO LV EF 35% - 40% (down from 50-55% 09/2014), moderate diffuse hypokinesis, mild to moderate MR and Grade 1 diastolic dysfunction who was transferred to [facility] from [facility] on 02/27/2016 for ongoing management.    1. ARF, oliguric ATN vs bactrim vs Immune complex disease from Osteomyelitis- Biopsy not done because of underlying infections and debility Patient was initially started on CRRT on 10/16 Tunneled dialysis cathter placed on 10/26 -  Creatinine was up to 4.2 with an EGFR 13 today. Therefore it appears that the patient remains dialysis dependent at the moment. We will plan for dialysis again on Friday.  2. CKD st 3/4 at baseline Patient remains dialysis-dependent as above. Please see plan above.  3. Anemia of CKD - hemoglobin has drifted down. Currently hemoglobin 7.3. Consider blood transfusion for hemoglobin of 7 or less.  4. St 4 decubitis and rt ischial Osteomyelitis:  Management per primary team. - wound vac and local wound care per hospitalist.  5.  Secondary hyperparathyroidism.  Phosphorus up to 6.1 today. Continue to monitor serum phosphorus.    LOS: 0 Lance [name] 11/22/20174:28 PM

## 2016-03-27 LAB — RENAL FUNCTION PANEL
ALBUMIN: 2.2 g/dL — AB (ref 3.5–5.0)
ANION GAP: 10 (ref 5–15)
BUN: 49 mg/dL — ABNORMAL HIGH (ref 6–20)
CALCIUM: 9.3 mg/dL (ref 8.9–10.3)
CO2: 26 mmol/L (ref 22–32)
Chloride: 103 mmol/L (ref 101–111)
Creatinine, Ser: 5.27 mg/dL — ABNORMAL HIGH (ref 0.61–1.24)
GFR calc non Af Amer: 10 mL/min — ABNORMAL LOW (ref >60)
GFR, EST AFRICAN AMERICAN: 11 mL/min — AB (ref >60)
Glucose, Bld: 136 mg/dL — ABNORMAL HIGH (ref 65–99)
PHOSPHORUS: 5.7 mg/dL — AB (ref 2.5–4.6)
POTASSIUM: 4.6 mmol/L (ref 3.5–5.1)
SODIUM: 139 mmol/L (ref 135–145)

## 2016-03-27 LAB — CBC
HCT: 24 % — ABNORMAL LOW (ref 39.0–52.0)
Hemoglobin: 7.6 g/dL — ABNORMAL LOW (ref 13.0–17.0)
MCH: 30.8 pg (ref 26.0–34.0)
MCHC: 31.7 g/dL (ref 30.0–36.0)
MCV: 97.2 fL (ref 78.0–100.0)
Platelets: 260 K/uL (ref 150–400)
RBC: 2.47 MIL/uL — ABNORMAL LOW (ref 4.22–5.81)
RDW: 17.6 % — ABNORMAL HIGH (ref 11.5–15.5)
WBC: 6.1 K/uL (ref 4.0–10.5)

## 2016-03-27 NOTE — Progress Notes (Signed)
[facility]  ROUNDING NOTE   Subjective:  Patient due for hemodialysis today. He will plan to set up for the dialysis treatment. Appears to be in good spirits.    Objective:  Vital signs in last 24 hours:  Temperature 98.3 pulse 73 respirations 10 blood pressure 141/75  Physical Exam: General: No acute distress  Head: Normocephalic, atraumatic. Moist oral mucosal membranes  Eyes: Anicteric  Neck: Supple  Lungs:  Clear to auscultation, normal effort  Heart: S1S2 no rubs  Abdomen:  Soft, nontender, bowel sounds present  Extremities: Trace peripheral edema. Unable to move LE  Neurologic: Nonfocal, moving all four extremities  Skin: Heel ulcers, soft boot support, decubitus with wound vac  Access: R IJ permcath    Basic Metabolic Panel:  Recent Labs Lab 03/22/16 0648 03/23/16 0624 03/24/16 0736 03/24/16 1708 03/27/16 0707  NA 137  --  139 134* 139  K 3.9  --  3.9 4.8 4.6  CL 100*  --   --  100* 103  CO2 28  --   --  21* 26  GLUCOSE 130*  --  106* 122* 136*  BUN 36*  --   --  44* 49*  CREATININE 3.73*  --   --  4.22* 5.27*  CALCIUM 9.0 9.0  --  8.5* 9.3  PHOS 4.7*  --   --  6.1* 5.7*    Liver Function Tests:  Recent Labs Lab 03/22/16 0648 03/24/16 1708 03/27/16 0707  ALBUMIN 2.0* 2.1* 2.2*   No results for input(s): LIPASE, AMYLASE in the last 168 hours. No results for input(s): AMMONIA in the last 168 hours.  CBC:  Recent Labs Lab 03/22/16 0648 03/24/16 0736 03/24/16 1708 03/25/16 0732 03/27/16 0707  WBC 7.6  --  5.8  --  6.1  HGB 7.8* 7.8* 7.2* 7.3* 7.6*  HCT 24.5* 23.0* 22.7* 22.6* 24.0*  MCV 95.3  --  96.2  --  97.2  PLT 263  --  249  --  260    Cardiac Enzymes:  Recent Labs Lab 03/24/16 1708  CKTOTAL 26*    BNP: Invalid input(s): POCBNP  CBG: No results for input(s): GLUCAP in the last 168 hours.  Microbiology: Results for orders placed or performed during the hospital encounter of 02/13/16  Culture, blood  (Routine X 2) w Reflex to ID Panel     Status: None   Collection Time: 02/15/16  1:00 PM  Result Value Ref Range Status   Specimen Description BLOOD RIGHT ARM  2 ML IN YELLOW  Final   Special Requests NONE  Final   Culture   Final    NO GROWTH 5 DAYS Performed at [facility]    Report Status 02/20/2016 FINAL  Final  Culture, blood (Routine X 2) w Reflex to ID Panel     Status: None   Collection Time: 02/15/16  1:00 PM  Result Value Ref Range Status   Specimen Description BLOOD LEFT ARM  3 ML IN YELLOW  Final   Special Requests NONE  Final   Culture   Final    NO GROWTH 5 DAYS Performed at [facility]    Report Status 02/20/2016 FINAL  Final    Coagulation Studies: No results for input(s): LABPROT, INR in the last 72 hours.  Urinalysis: No results for input(s): COLORURINE, LABSPEC, PHURINE, GLUCOSEU, HGBUR, BILIRUBINUR, KETONESUR, PROTEINUR, UROBILINOGEN, NITRITE, LEUKOCYTESUR in the last 72 hours.  Invalid input(s): APPERANCEUR    Imaging: No results found.  Medications:    Aranesp 60   Assessment/ Plan:  75 y.o. caucasian male with medical problems of DM2, HTN, multiple sclerosis, spastic quadriplegia, urinary retention, h/o decubitus/ischial ulcers, baseline CKD. Recently hospitalized (9/29-10/4) with R ischial pressure ulcer, that was debrided, treated with 6 days of vanco and zosyn (for this plus a klebsiella and strep UTI) Discharged 10/4 (with a creatinine of 1.82)to finish 4 more days of bactrim. [facility] with new onset dyspnea, andhypoxemia. CXR in the ED showed pulmonary edema, creatinine was noted to be elevated at 3.3 and he was admitted. BNP elevated >900, troponins elevated. ECHO LV EF 35% - 40% (down from 50-55% 09/2014), moderate diffuse hypokinesis, mild to moderate MR and Grade 1 diastolic dysfunction who was transferred to [facility] from [facility] on 02/27/2016 for ongoing management.    1. ARF, oliguric ATN vs bactrim vs Immune complex disease from Osteomyelitis- Biopsy not done because of underlying infections and debility Patient was initially started on CRRT on 10/16 Tunneled dialysis cathter placed on 10/26 Right radiocephalic fistula placed 16/01/09 -  Patient due for hemodialysis again today. He will perform dialysis while sitting up in a chair.  2. CKD st 3/4 at baseline Creatinine has trended higher today at 5.3. Patient most likely dialysis dependent.  3. Anemia of CKD -hemoglobin currently 7.6 which is a bit higher than before. Continue to monitor CBC.  4. St 4 decubitis and rt ischial Osteomyelitis:  Management per primary team. - wound vac and local wound care per hospitalist.  5.  Secondary hyperparathyroidism.  Phosphorous slightly high today at 5.7. His phosphorus remains high next week consider binder therapy.   LOS: 0 Sarinah Doetsch 11/24/201712:02 PM

## 2016-03-30 LAB — RENAL FUNCTION PANEL
ALBUMIN: 2.3 g/dL — AB (ref 3.5–5.0)
Anion gap: 10 (ref 5–15)
BUN: 49 mg/dL — ABNORMAL HIGH (ref 6–20)
CALCIUM: 9.2 mg/dL (ref 8.9–10.3)
CO2: 27 mmol/L (ref 22–32)
CREATININE: 5.14 mg/dL — AB (ref 0.61–1.24)
Chloride: 102 mmol/L (ref 101–111)
GFR, EST AFRICAN AMERICAN: 12 mL/min — AB (ref >60)
GFR, EST NON AFRICAN AMERICAN: 10 mL/min — AB (ref >60)
Glucose, Bld: 101 mg/dL — ABNORMAL HIGH (ref 65–99)
PHOSPHORUS: 6 mg/dL — AB (ref 2.5–4.6)
Potassium: 4.3 mmol/L (ref 3.5–5.1)
SODIUM: 139 mmol/L (ref 135–145)

## 2016-03-30 LAB — CBC
HCT: 23.5 % — ABNORMAL LOW (ref 39.0–52.0)
Hemoglobin: 7.5 g/dL — ABNORMAL LOW (ref 13.0–17.0)
MCH: 31.3 pg (ref 26.0–34.0)
MCHC: 31.9 g/dL (ref 30.0–36.0)
MCV: 97.9 fL (ref 78.0–100.0)
PLATELETS: 282 10*3/uL (ref 150–400)
RBC: 2.4 MIL/uL — AB (ref 4.22–5.81)
RDW: 17.7 % — ABNORMAL HIGH (ref 11.5–15.5)
WBC: 7.8 10*3/uL (ref 4.0–10.5)

## 2016-03-30 NOTE — Progress Notes (Signed)
[facility]  ROUNDING NOTE   Subjective:  Patient seen at the end of dialysis.Tolerated well.. UF goal had to be decreased due to low BP Patient did dialysis sitting up in the chair  urine output 800 cc however, serum creatinine remains elevated at 5.14    Objective:  Vital signs in last 24 hours:  Temperature 98.1 pulse 68, respirations 10, blood pressure 98/51  Physical Exam: General: No acute distress  Head: Normocephalic, atraumatic. Moist oral mucosal membranes  Eyes: Anicteric  Neck: Supple  Lungs:  normal effort  Heart: S1S2 no rubs  Abdomen:  Soft, nontender, foley  Extremities: Trace peripheral edema. Unable to move LE  Neurologic: Alert, able to answer question  Skin: Heel ulcers, soft boot support, decubitus  Access: R IJ permcath    Basic Metabolic Panel:  Recent Labs Lab 03/24/16 0736 03/24/16 1708 03/27/16 0707 03/30/16 0818  NA 139 134* 139 139  K 3.9 4.8 4.6 4.3  CL  --  100* 103 102  CO2  --  21* 26 27  GLUCOSE 106* 122* 136* 101*  BUN  --  44* 49* 49*  CREATININE  --  4.22* 5.27* 5.14*  CALCIUM  --  8.5* 9.3 9.2  PHOS  --  6.1* 5.7* 6.0*    Liver Function Tests:  Recent Labs Lab 03/24/16 1708 03/27/16 0707 03/30/16 0818  ALBUMIN 2.1* 2.2* 2.3*   No results for input(s): LIPASE, AMYLASE in the last 168 hours. No results for input(s): AMMONIA in the last 168 hours.  CBC:  Recent Labs Lab 03/24/16 0736 03/24/16 1708 03/25/16 0732 03/27/16 0707 03/30/16 0818  WBC  --  5.8  --  6.1 7.8  HGB 7.8* 7.2* 7.3* 7.6* 7.5*  HCT 23.0* 22.7* 22.6* 24.0* 23.5*  MCV  --  96.2  --  97.2 97.9  PLT  --  249  --  260 282    Cardiac Enzymes:  Recent Labs Lab 03/24/16 1708  CKTOTAL 26*    BNP: Invalid input(s): POCBNP  CBG: No results for input(s): GLUCAP in the last 168 hours.  Microbiology: Results for orders placed or performed during the hospital encounter of 02/13/16  Culture, blood (Routine X 2) w Reflex to ID  Panel     Status: None   Collection Time: 02/15/16  1:00 PM  Result Value Ref Range Status   Specimen Description BLOOD RIGHT ARM  2 ML IN YELLOW  Final   Special Requests NONE  Final   Culture   Final    NO GROWTH 5 DAYS Performed at [facility]    Report Status 02/20/2016 FINAL  Final  Culture, blood (Routine X 2) w Reflex to ID Panel     Status: None   Collection Time: 02/15/16  1:00 PM  Result Value Ref Range Status   Specimen Description BLOOD LEFT ARM  3 ML IN YELLOW  Final   Special Requests NONE  Final   Culture   Final    NO GROWTH 5 DAYS Performed at [facility]    Report Status 02/20/2016 FINAL  Final    Coagulation Studies: No results for input(s): LABPROT, INR in the last 72 hours.  Urinalysis: No results for input(s): COLORURINE, LABSPEC, PHURINE, GLUCOSEU, HGBUR, BILIRUBINUR, KETONESUR, PROTEINUR, UROBILINOGEN, NITRITE, LEUKOCYTESUR in the last 72 hours.  Invalid input(s): APPERANCEUR    Imaging: No results found.   Medications:    Aranesp 60   Assessment/ Plan:  75 y.o. caucasian male with medical  problems of DM2, HTN, multiple sclerosis, spastic quadriplegia, urinary retention, h/o decubitus/ischial ulcers, baseline CKD. Recently hospitalized (9/29-10/4) with R ischial pressure ulcer, that was debrided, treated with 6 days of vanco and zosyn (for this plus a klebsiella and strep UTI) Discharged 10/4 (with a creatinine of 1.82)to finish 4 more days of bactrim. [facility] with new onset dyspnea, andhypoxemia. CXR in the ED showed pulmonary edema, creatinine was noted to be elevated at 3.3 and he was admitted. BNP elevated >900, troponins elevated. ECHO LV EF 35% - 40% (down from 50-55% 09/2014), moderate diffuse hypokinesis, mild to moderate MR and Grade 1 diastolic dysfunction who was transferred to [facility] from [facility] on 02/27/2016 for ongoing management.   1. ARF,  UOP 800 cc ATN vs  bactrim vs Immune complex disease from Osteomyelitis- Biopsy not done because of underlying infections and debility Patient was initially started on CRRT on 10/16 Tunneled dialysis cathter placed on 10/26 Right radiocephalic fistula placed 16/60/60 -  Patient underwent emodialysis while sitting up in a chair. UOP 800 cc; S Cr remains high at >5 - nearing ESRD  2. CKD st 3/4 at baseline Creatinine has remained high. Patient most likely dialysis dependent.  3. Anemia of CKD -hemoglobin currently 7.5   - Aranesp continued  4. St 4 decubitis and rt ischial Osteomyelitis:  Management per primary team. - wound vac and local wound care per hospitalist.  5.  Secondary hyperparathyroidism.   Phosphorous slightly high today at 6.0.  Start binder therapy.    LOS: 0 Romon Devereux 11/27/20173:48 PM

## 2016-04-01 LAB — CBC
HCT: 23.6 % — ABNORMAL LOW (ref 39.0–52.0)
HEMOGLOBIN: 7.7 g/dL — AB (ref 13.0–17.0)
MCH: 31.7 pg (ref 26.0–34.0)
MCHC: 32.6 g/dL (ref 30.0–36.0)
MCV: 97.1 fL (ref 78.0–100.0)
PLATELETS: 247 10*3/uL (ref 150–400)
RBC: 2.43 MIL/uL — AB (ref 4.22–5.81)
RDW: 17.4 % — ABNORMAL HIGH (ref 11.5–15.5)
WBC: 7.8 10*3/uL (ref 4.0–10.5)

## 2016-04-01 LAB — RENAL FUNCTION PANEL
ANION GAP: 12 (ref 5–15)
Albumin: 2.3 g/dL — ABNORMAL LOW (ref 3.5–5.0)
BUN: 39 mg/dL — ABNORMAL HIGH (ref 6–20)
CALCIUM: 9.6 mg/dL (ref 8.9–10.3)
CO2: 26 mmol/L (ref 22–32)
CREATININE: 4.51 mg/dL — AB (ref 0.61–1.24)
Chloride: 97 mmol/L — ABNORMAL LOW (ref 101–111)
GFR, EST AFRICAN AMERICAN: 14 mL/min — AB (ref >60)
GFR, EST NON AFRICAN AMERICAN: 12 mL/min — AB (ref >60)
Glucose, Bld: 120 mg/dL — ABNORMAL HIGH (ref 65–99)
PHOSPHORUS: 5.7 mg/dL — AB (ref 2.5–4.6)
Potassium: 4.1 mmol/L (ref 3.5–5.1)
SODIUM: 135 mmol/L (ref 135–145)

## 2016-04-01 NOTE — Progress Notes (Signed)
[facility]  ROUNDING NOTE   Subjective:  Patient is doing fair Patient has been sitting in chair for HD. Has had 3 sitting treatments  urine output 950 cc however, serum creatinine remains elevated at 4.51    Objective:  Vital signs in last 24 hours:  Temperature 98.3 pulse 71, respirations 10, blood pressure 124/59  Physical Exam: General: No acute distress  Head: Normocephalic, atraumatic. Moist oral mucosal membranes  Eyes: Anicteric  Neck: Supple  Lungs:  normal effort  Heart: S1S2 no rubs  Abdomen:  Soft, nontender, foley  Extremities: Trace peripheral edema. Unable to move LE  Neurologic: Alert, oriented  Skin: Heel ulcers, soft boot support, decubitus, wound vac  Access: R IJ permcath    Basic Metabolic Panel:  Recent Labs Lab 03/27/16 0707 03/30/16 0818 04/01/16 0724  NA 139 139 135  K 4.6 4.3 4.1  CL 103 102 97*  CO2 26 27 26   GLUCOSE 136* 101* 120*  BUN 49* 49* 39*  CREATININE 5.27* 5.14* 4.51*  CALCIUM 9.3 9.2 9.6  PHOS 5.7* 6.0* 5.7*    Liver Function Tests:  Recent Labs Lab 03/27/16 0707 03/30/16 0818 04/01/16 0724  ALBUMIN 2.2* 2.3* 2.3*   No results for input(s): LIPASE, AMYLASE in the last 168 hours. No results for input(s): AMMONIA in the last 168 hours.  CBC:  Recent Labs Lab 03/27/16 0707 03/30/16 0818 04/01/16 0724  WBC 6.1 7.8 7.8  HGB 7.6* 7.5* 7.7*  HCT 24.0* 23.5* 23.6*  MCV 97.2 97.9 97.1  PLT 260 282 247    Cardiac Enzymes: No results for input(s): CKTOTAL, CKMB, CKMBINDEX, TROPONINI in the last 168 hours.  BNP: Invalid input(s): POCBNP  CBG: No results for input(s): GLUCAP in the last 168 hours.  Microbiology: Results for orders placed or performed during the hospital encounter of 02/13/16  Culture, blood (Routine X 2) w Reflex to ID Panel     Status: None   Collection Time: 02/15/16  1:00 PM  Result Value Ref Range Status   Specimen Description BLOOD RIGHT ARM  2 ML IN YELLOW  Final    Special Requests NONE  Final   Culture   Final    NO GROWTH 5 DAYS Performed at [facility]    Report Status 02/20/2016 FINAL  Final  Culture, blood (Routine X 2) w Reflex to ID Panel     Status: None   Collection Time: 02/15/16  1:00 PM  Result Value Ref Range Status   Specimen Description BLOOD LEFT ARM  3 ML IN YELLOW  Final   Special Requests NONE  Final   Culture   Final    NO GROWTH 5 DAYS Performed at [facility]    Report Status 02/20/2016 FINAL  Final    Coagulation Studies: No results for input(s): LABPROT, INR in the last 72 hours.  Urinalysis: No results for input(s): COLORURINE, LABSPEC, PHURINE, GLUCOSEU, HGBUR, BILIRUBINUR, KETONESUR, PROTEINUR, UROBILINOGEN, NITRITE, LEUKOCYTESUR in the last 72 hours.  Invalid input(s): APPERANCEUR    Imaging: No results found.   Medications:    Aranesp 100   Assessment/ Plan:  75 y.o. caucasian male with medical problems of DM2, HTN, multiple sclerosis, spastic quadriplegia, urinary retention, h/o decubitus/ischial ulcers, baseline CKD. Recently hospitalized (9/29-10/4) with R ischial pressure ulcer, that was debrided, treated with 6 days of vanco and zosyn (for this plus a klebsiella and strep UTI) Discharged 10/4 (with a creatinine of 1.82)to finish 4 more days of bactrim. [facility] with new onset dyspnea, andhypoxemia. CXR in the ED showed pulmonary edema, creatinine was noted to be elevated at 3.3 and he was admitted. BNP elevated >900, troponins elevated. ECHO LV EF 35% - 40% (down from 50-55% 09/2014), moderate diffuse hypokinesis, mild to moderate MR and Grade 1 diastolic dysfunction who was transferred to [facility] on 02/27/2016 for ongoing management.   1. ARF,   ATN vs bactrim vs Immune complex disease from Osteomyelitis- Biopsy not done because of underlying infections and debility Patient was initially started on CRRT on  10/16 Tunneled dialysis cathter placed on 10/26 Right radiocephalic fistula placed 65/03/54 -  Patient underwent emodialysis while sitting up in a chair. UOP 950 cc; S Cr remains high at >4.5 - nearing ESRD, but will try 24 urine collection this weekend as UOP appears to have improved No UF with HD Hold HD on Friday for 24 hr urine Cr Clearance  2. CKD st 3/4 at baseline Creatinine has remained high. Patient most likely dialysis dependent.  3. Anemia of CKD -hemoglobin currently 7.7   - Aranesp continued  4. St 4 decubitis and rt ischial Osteomyelitis:  Management per primary team. - wound vac and local wound care per hospitalist.  5.  Secondary hyperparathyroidism.   Phosphorous slightly high today at 5.7.  continue binder therapy.    LOS: 0 Aleighna Wojtas 11/29/201710:04 AM

## 2016-04-03 LAB — RENAL FUNCTION PANEL
ALBUMIN: 2.2 g/dL — AB (ref 3.5–5.0)
Anion gap: 9 (ref 5–15)
BUN: 40 mg/dL — AB (ref 6–20)
CALCIUM: 10.3 mg/dL (ref 8.9–10.3)
CO2: 28 mmol/L (ref 22–32)
CREATININE: 4.46 mg/dL — AB (ref 0.61–1.24)
Chloride: 100 mmol/L — ABNORMAL LOW (ref 101–111)
GFR calc Af Amer: 14 mL/min — ABNORMAL LOW (ref >60)
GFR, EST NON AFRICAN AMERICAN: 12 mL/min — AB (ref >60)
GLUCOSE: 185 mg/dL — AB (ref 65–99)
PHOSPHORUS: 4.7 mg/dL — AB (ref 2.5–4.6)
Potassium: 4.5 mmol/L (ref 3.5–5.1)
SODIUM: 137 mmol/L (ref 135–145)

## 2016-04-03 NOTE — Progress Notes (Signed)
[facility]  ROUNDING NOTE   Subjective:  Patient is doing fair Patient has been sitting in chair for HD. Has had 3 sitting treatments  urine output >850 cc however, serum creatinine remains elevated at 4.51    Objective:  Vital signs in last 24 hours:  Temperature 97.8 pulse 81, respirations 10, blood pressure 138/66  Physical Exam: General: No acute distress  Head: Normocephalic, atraumatic. Moist oral mucosal membranes  Eyes: Anicteric  Neck: Supple  Lungs:  normal effort  Heart: S1S2 no rubs  Abdomen:  Soft, nontender, foley  Extremities: Trace peripheral edema. Unable to move LE  Neurologic: Alert, oriented  Skin: Heel ulcers, soft boot support, decubitus, wound vac  Access: R IJ permcath    Basic Metabolic Panel:  Recent Labs Lab 03/30/16 0818 04/01/16 0724  NA 139 135  K 4.3 4.1  CL 102 97*  CO2 27 26  GLUCOSE 101* 120*  BUN 49* 39*  CREATININE 5.14* 4.51*  CALCIUM 9.2 9.6  PHOS 6.0* 5.7*    Liver Function Tests:  Recent Labs Lab 03/30/16 0818 04/01/16 0724  ALBUMIN 2.3* 2.3*   No results for input(s): LIPASE, AMYLASE in the last 168 hours. No results for input(s): AMMONIA in the last 168 hours.  CBC:  Recent Labs Lab 03/30/16 0818 04/01/16 0724  WBC 7.8 7.8  HGB 7.5* 7.7*  HCT 23.5* 23.6*  MCV 97.9 97.1  PLT 282 247    Cardiac Enzymes: No results for input(s): CKTOTAL, CKMB, CKMBINDEX, TROPONINI in the last 168 hours.  BNP: Invalid input(s): POCBNP  CBG: No results for input(s): GLUCAP in the last 168 hours.  Microbiology: Results for orders placed or performed during the hospital encounter of 02/13/16  Culture, blood (Routine X 2) w Reflex to ID Panel     Status: None   Collection Time: 02/15/16  1:00 PM  Result Value Ref Range Status   Specimen Description BLOOD RIGHT ARM  2 ML IN YELLOW  Final   Special Requests NONE  Final   Culture   Final    NO GROWTH 5 DAYS Performed at [facility]    Report  Status 02/20/2016 FINAL  Final  Culture, blood (Routine X 2) w Reflex to ID Panel     Status: None   Collection Time: 02/15/16  1:00 PM  Result Value Ref Range Status   Specimen Description BLOOD LEFT ARM  3 ML IN YELLOW  Final   Special Requests NONE  Final   Culture   Final    NO GROWTH 5 DAYS Performed at [facility]    Report Status 02/20/2016 FINAL  Final    Coagulation Studies: No results for input(s): LABPROT, INR in the last 72 hours.  Urinalysis: No results for input(s): COLORURINE, LABSPEC, PHURINE, GLUCOSEU, HGBUR, BILIRUBINUR, KETONESUR, PROTEINUR, UROBILINOGEN, NITRITE, LEUKOCYTESUR in the last 72 hours.  Invalid input(s): APPERANCEUR    Imaging: No results found.   Medications:    Aranesp 100   Assessment/ Plan:  75 y.o. caucasian male with medical problems of DM2, HTN, multiple sclerosis, spastic quadriplegia, urinary retention, h/o decubitus/ischial ulcers, baseline CKD. Recently hospitalized (9/29-10/4) with R ischial pressure ulcer, that was debrided, treated with 6 days of vanco and zosyn (for this plus a klebsiella and strep UTI) Discharged 10/4 (with a creatinine of 1.82)to finish 4 more days of bactrim. [facility] with new onset dyspnea, andhypoxemia. CXR in the ED showed pulmonary edema, creatinine  was noted to be elevated at 3.3 and he was admitted. BNP elevated >900, troponins elevated. ECHO LV EF 35% - 40% (down from 50-55% 09/2014), moderate diffuse hypokinesis, mild to moderate MR and Grade 1 diastolic dysfunction who was transferred to [facility] from [facility] on 02/27/2016 for ongoing management.   1. ARF,   ATN vs bactrim vs Immune complex disease from Osteomyelitis- Biopsy not done because of underlying infections and debility Patient was initially started on CRRT on 10/16 Tunneled dialysis cathter placed on 10/26 Right radiocephalic fistula placed 95/09/32 -  Patient underwent  hemodialysis while sitting up in a chair. UOP good cc; S Cr remains high at >4.5 - nearing ESRD, but will try 24 urine collection this weekend as UOP appears to have improved Hold HD today for 24 hr urine Cr Clearance  2. CKD st 3/4 at baseline Creatinine has remained high. Patient most likely dialysis dependent.  3. Anemia of CKD -hemoglobin currently 7.7   - Aranesp continued  4. St 4 decubitis and rt ischial Osteomyelitis:  Management per primary team. - wound vac and local wound care per hospitalist.  5.  Secondary hyperparathyroidism.   Phosphorous slightly high today at 5.7.  continue binder therapy.    LOS: 0 Carly Sabo 12/1/20179:49 AM

## 2016-04-04 LAB — BASIC METABOLIC PANEL
Anion gap: 10 (ref 5–15)
BUN: 45 mg/dL — AB (ref 6–20)
CALCIUM: 10.2 mg/dL (ref 8.9–10.3)
CO2: 28 mmol/L (ref 22–32)
CREATININE: 4.91 mg/dL — AB (ref 0.61–1.24)
Chloride: 99 mmol/L — ABNORMAL LOW (ref 101–111)
GFR calc Af Amer: 12 mL/min — ABNORMAL LOW (ref >60)
GFR, EST NON AFRICAN AMERICAN: 11 mL/min — AB (ref >60)
GLUCOSE: 136 mg/dL — AB (ref 65–99)
Potassium: 4 mmol/L (ref 3.5–5.1)
SODIUM: 137 mmol/L (ref 135–145)

## 2016-04-04 LAB — CREATININE CLEARANCE, URINE, 24 HOUR
CREATININE 24H UR: 480 mg/d — AB (ref 800–2000)
CREATININE, URINE: 29.99 mg/dL
Collection Interval-CRCL: 24 hours
Creatinine Clearance: 7 mL/min — ABNORMAL LOW (ref 75–125)
Urine Total Volume-CRCL: 1600 mL

## 2016-04-06 LAB — CBC
HEMATOCRIT: 25.2 % — AB (ref 39.0–52.0)
HEMOGLOBIN: 8 g/dL — AB (ref 13.0–17.0)
MCH: 31 pg (ref 26.0–34.0)
MCHC: 31.7 g/dL (ref 30.0–36.0)
MCV: 97.7 fL (ref 78.0–100.0)
Platelets: 255 10*3/uL (ref 150–400)
RBC: 2.58 MIL/uL — ABNORMAL LOW (ref 4.22–5.81)
RDW: 16.4 % — AB (ref 11.5–15.5)
WBC: 7.4 10*3/uL (ref 4.0–10.5)

## 2016-04-06 NOTE — Progress Notes (Signed)
[facility]  ROUNDING NOTE   Subjective:  Patient is doing fair Patient has been sitting in chair for HD.   24 hr urine CrCl was 7 cc/min    Objective:  Vital signs in last 24 hours:  Temperature 98.3 pulse 73, respirations 16, blood pressure 121/64  Physical Exam: General: No acute distress  Head: Normocephalic, atraumatic. Moist oral mucosal membranes  Eyes: Anicteric  Neck: Supple  Lungs:  normal effort  Heart: S1S2 no rubs  Abdomen:  Soft, nontender, foley  Extremities: Trace peripheral edema. Unable to move LE  Neurologic: Alert, oriented  Skin: Heel ulcers, soft boot support, decubitus, wound vac  Access: R IJ permcath, rt arm AVF developing    Basic Metabolic Panel:  Recent Labs Lab 04/01/16 0724 04/03/16 1146 04/04/16 0524  NA 135 137 137  K 4.1 4.5 4.0  CL 97* 100* 99*  CO2 26 28 28   GLUCOSE 120* 185* 136*  BUN 39* 40* 45*  CREATININE 4.51* 4.46* 4.91*  CALCIUM 9.6 10.3 10.2  PHOS 5.7* 4.7*  --     Liver Function Tests:  Recent Labs Lab 04/01/16 0724 04/03/16 1146  ALBUMIN 2.3* 2.2*   No results for input(s): LIPASE, AMYLASE in the last 168 hours. No results for input(s): AMMONIA in the last 168 hours.  CBC:  Recent Labs Lab 04/01/16 0724  WBC 7.8  HGB 7.7*  HCT 23.6*  MCV 97.1  PLT 247    Cardiac Enzymes: No results for input(s): CKTOTAL, CKMB, CKMBINDEX, TROPONINI in the last 168 hours.  BNP: Invalid input(s): POCBNP  CBG: No results for input(s): GLUCAP in the last 168 hours.  Microbiology: Results for orders placed or performed during the hospital encounter of 02/13/16  Culture, blood (Routine X 2) w Reflex to ID Panel     Status: None   Collection Time: 02/15/16  1:00 PM  Result Value Ref Range Status   Specimen Description BLOOD RIGHT ARM  2 ML IN YELLOW  Final   Special Requests NONE  Final   Culture   Final    NO GROWTH 5 DAYS Performed at [facility]    Report Status 02/20/2016 FINAL  Final   Culture, blood (Routine X 2) w Reflex to ID Panel     Status: None   Collection Time: 02/15/16  1:00 PM  Result Value Ref Range Status   Specimen Description BLOOD LEFT ARM  3 ML IN YELLOW  Final   Special Requests NONE  Final   Culture   Final    NO GROWTH 5 DAYS Performed at [facility]    Report Status 02/20/2016 FINAL  Final    Coagulation Studies: No results for input(s): LABPROT, INR in the last 72 hours.  Urinalysis: No results for input(s): COLORURINE, LABSPEC, PHURINE, GLUCOSEU, HGBUR, BILIRUBINUR, KETONESUR, PROTEINUR, UROBILINOGEN, NITRITE, LEUKOCYTESUR in the last 72 hours.  Invalid input(s): APPERANCEUR    Imaging: No results found.   Medications:    Aranesp 100   Assessment/ Plan:  75 y.o. caucasian male with medical problems of DM2, HTN, multiple sclerosis, spastic quadriplegia, urinary retention, h/o decubitus/ischial ulcers, baseline CKD. Recently hospitalized (9/29-10/4) with R ischial pressure ulcer, that was debrided, treated with 6 days of vanco and zosyn (for this plus a klebsiella and strep UTI) Discharged 10/4 (with a creatinine of 1.82)to finish 4 more days of bactrim. [facility] with new onset dyspnea, andhypoxemia. CXR in the ED showed pulmonary  edema, creatinine was noted to be elevated at 3.3 and he was admitted. BNP elevated >900, troponins elevated. ECHO LV EF 35% - 40% (down from 50-55% 09/2014), moderate diffuse hypokinesis, mild to moderate MR and Grade 1 diastolic dysfunction who was transferred to [facility] from [facility] on 02/27/2016 for ongoing management.   1. ESRD- worsening of diabetic nephropathy ATN vs bactrim vs Immune complex disease from Osteomyelitis- Biopsy not done because of underlying infections and debility Patient was initially started on CRRT on 10/16 Tunneled dialysis cathter placed on 10/26 Right radiocephalic fistula placed 25/83/46  CrCl 7 cc/min, UOP 1600  cc Patient likely dialysis dependent at present Outpatient d/c planning Minimal uf with HD as patient is maintaining good UOP  2. Anemia of CKD -hemoglobin currently 7.7   - Aranesp continued  3. St 4 decubitis and rt ischial Osteomyelitis:  Management per primary team. - wound vac and local wound care per hospitalist. - Patient is able to sit in chair for dialysis  4.  Secondary hyperparathyroidism.   continue binder therapy.  Results for Lance [name], Lance [name] (MRN [id number]) as of 04/06/2016 09:34  Ref. Range 03/22/2016 19:04  Hepatitis B Surface Ag Latest Ref Range: Negative  Negative      LOS: 0 Dave Mergen 12/4/20179:30 AM

## 2016-04-08 LAB — CBC
HEMATOCRIT: 21.6 % — AB (ref 39.0–52.0)
Hemoglobin: 7 g/dL — ABNORMAL LOW (ref 13.0–17.0)
MCH: 32 pg (ref 26.0–34.0)
MCHC: 32.4 g/dL (ref 30.0–36.0)
MCV: 98.6 fL (ref 78.0–100.0)
PLATELETS: 219 10*3/uL (ref 150–400)
RBC: 2.19 MIL/uL — AB (ref 4.22–5.81)
RDW: 16.3 % — AB (ref 11.5–15.5)
WBC: 7.1 10*3/uL (ref 4.0–10.5)

## 2016-04-08 NOTE — Progress Notes (Signed)
[facility]  ROUNDING NOTE   Subjective:  Patient is doing fair Patient has been sitting in chair for HD.   24 hr urine CrCl was 7 cc/min Seen during HD, tolerating well BFR 300-350;  uop 1900 cc    Objective:  Vital signs in last 24 hours:  Temperature 97.4 pulse 66, respirations 16, blood pressure 98/48  Physical Exam: General: No acute distress  Head: Normocephalic, atraumatic. Moist oral mucosal membranes  Eyes: Anicteric  Neck: Supple  Lungs:  normal effort  Heart: S1S2 no rubs  Abdomen:  Soft, nontender, foley  Extremities: Trace peripheral edema. Unable to move LE  Neurologic: Alert, oriented  Skin: Heel ulcers, soft boot support, decubitus, wound vac  Access: R IJ permcath, rt arm AVF developing  Chronic foley  Basic Metabolic Panel:  Recent Labs Lab 04/03/16 1146 04/04/16 0524  NA 137 137  K 4.5 4.0  CL 100* 99*  CO2 28 28  GLUCOSE 185* 136*  BUN 40* 45*  CREATININE 4.46* 4.91*  CALCIUM 10.3 10.2  PHOS 4.7*  --     Liver Function Tests:  Recent Labs Lab 04/03/16 1146  ALBUMIN 2.2*   No results for input(s): LIPASE, AMYLASE in the last 168 hours. No results for input(s): AMMONIA in the last 168 hours.  CBC:  Recent Labs Lab 04/06/16 1023 04/08/16 1554  WBC 7.4 7.1  HGB 8.0* 7.0*  HCT 25.2* 21.6*  MCV 97.7 98.6  PLT 255 219    Cardiac Enzymes: No results for input(s): CKTOTAL, CKMB, CKMBINDEX, TROPONINI in the last 168 hours.  BNP: Invalid input(s): POCBNP  CBG: No results for input(s): GLUCAP in the last 168 hours.  Microbiology: Results for orders placed or performed during the hospital encounter of 02/13/16  Culture, blood (Routine X 2) w Reflex to ID Panel     Status: None   Collection Time: 02/15/16  1:00 PM  Result Value Ref Range Status   Specimen Description BLOOD RIGHT ARM  2 ML IN YELLOW  Final   Special Requests NONE  Final   Culture   Final    NO GROWTH 5 DAYS Performed at [facility]    Report Status 02/20/2016 FINAL  Final  Culture, blood (Routine X 2) w Reflex to ID Panel     Status: None   Collection Time: 02/15/16  1:00 PM  Result Value Ref Range Status   Specimen Description BLOOD LEFT ARM  3 ML IN YELLOW  Final   Special Requests NONE  Final   Culture   Final    NO GROWTH 5 DAYS Performed at [facility]    Report Status 02/20/2016 FINAL  Final    Coagulation Studies: No results for input(s): LABPROT, INR in the last 72 hours.  Urinalysis: No results for input(s): COLORURINE, LABSPEC, PHURINE, GLUCOSEU, HGBUR, BILIRUBINUR, KETONESUR, PROTEINUR, UROBILINOGEN, NITRITE, LEUKOCYTESUR in the last 72 hours.  Invalid input(s): APPERANCEUR    Imaging: No results found.   Medications:    Aranesp 100   Assessment/ Plan:  75 y.o. caucasian male with medical problems of DM2, HTN, multiple sclerosis, spastic quadriplegia, urinary retention, h/o decubitus/ischial ulcers, baseline CKD. Recently hospitalized (9/29-10/4) with R ischial pressure ulcer, that was debrided, treated with 6 days of vanco and zosyn (for this plus a klebsiella and strep UTI) Discharged 10/4 (with a creatinine of 1.82)to finish 4 more days of bactrim. [facility] with new onset dyspnea, andhypoxemia. CXR in  the ED showed pulmonary edema, creatinine was noted to be elevated at 3.3 and he was admitted. BNP elevated >900, troponins elevated. ECHO LV EF 35% - 40% (down from 50-55% 09/2014), moderate diffuse hypokinesis, mild to moderate MR and Grade 1 diastolic dysfunction who was transferred to [facility] on 02/27/2016 for ongoing management.   1. ESRD- worsening of diabetic nephropathy ATN vs bactrim vs Immune complex disease from Osteomyelitis- Biopsy not done because of underlying infections and debility Patient was initially started on CRRT on 10/16 Tunneled dialysis cathter placed on 10/26 Right radiocephalic fistula placed  29/52/84  CrCl 7 cc/min, UOP 1600 cc Patient likely dialysis dependent at present Outpatient d/c planning Minimal uf with HD as patient is maintaining good UOP  2. Anemia of CKD -hemoglobin currently 7.0   - Aranesp continued  3. St 4 decubitis and rt ischial Osteomyelitis:  Management per primary team. - wound vac and local wound care per hospitalist. - Patient is able to sit in chair for dialysis  4.  Secondary hyperparathyroidism.   continue binder therapy.    Ref. Range 03/22/2016 19:04  Hepatitis B Surface Ag Latest Ref Range: Negative  Negative      LOS: 0 Asaph Serena 12/6/20174:50 PM

## 2016-04-09 DIAGNOSIS — R6510 Systemic inflammatory response syndrome (SIRS) of non-infectious origin without acute organ dysfunction: Secondary | ICD-10-CM | POA: Diagnosis not present

## 2016-04-09 DIAGNOSIS — N186 End stage renal disease: Secondary | ICD-10-CM | POA: Diagnosis not present

## 2016-04-09 DIAGNOSIS — Z466 Encounter for fitting and adjustment of urinary device: Secondary | ICD-10-CM | POA: Diagnosis not present

## 2016-04-09 DIAGNOSIS — A419 Sepsis, unspecified organism: Secondary | ICD-10-CM | POA: Diagnosis not present

## 2016-04-09 DIAGNOSIS — I129 Hypertensive chronic kidney disease with stage 1 through stage 4 chronic kidney disease, or unspecified chronic kidney disease: Secondary | ICD-10-CM | POA: Diagnosis not present

## 2016-04-09 DIAGNOSIS — Y95 Nosocomial condition: Secondary | ICD-10-CM | POA: Diagnosis present

## 2016-04-09 DIAGNOSIS — F329 Major depressive disorder, single episode, unspecified: Secondary | ICD-10-CM | POA: Diagnosis not present

## 2016-04-09 DIAGNOSIS — B999 Unspecified infectious disease: Secondary | ICD-10-CM | POA: Diagnosis not present

## 2016-04-09 DIAGNOSIS — D631 Anemia in chronic kidney disease: Secondary | ICD-10-CM | POA: Diagnosis not present

## 2016-04-09 DIAGNOSIS — R05 Cough: Secondary | ICD-10-CM | POA: Diagnosis not present

## 2016-04-09 DIAGNOSIS — I501 Left ventricular failure: Secondary | ICD-10-CM | POA: Diagnosis not present

## 2016-04-09 DIAGNOSIS — D509 Iron deficiency anemia, unspecified: Secondary | ICD-10-CM | POA: Diagnosis not present

## 2016-04-09 DIAGNOSIS — G8220 Paraplegia, unspecified: Secondary | ICD-10-CM | POA: Diagnosis not present

## 2016-04-09 DIAGNOSIS — R369 Urethral discharge, unspecified: Secondary | ICD-10-CM | POA: Diagnosis not present

## 2016-04-09 DIAGNOSIS — Z992 Dependence on renal dialysis: Secondary | ICD-10-CM | POA: Diagnosis not present

## 2016-04-09 DIAGNOSIS — R195 Other fecal abnormalities: Secondary | ICD-10-CM | POA: Diagnosis not present

## 2016-04-09 DIAGNOSIS — J9690 Respiratory failure, unspecified, unspecified whether with hypoxia or hypercapnia: Secondary | ICD-10-CM | POA: Diagnosis not present

## 2016-04-09 DIAGNOSIS — J181 Lobar pneumonia, unspecified organism: Secondary | ICD-10-CM | POA: Diagnosis not present

## 2016-04-09 DIAGNOSIS — J9601 Acute respiratory failure with hypoxia: Secondary | ICD-10-CM | POA: Diagnosis present

## 2016-04-09 DIAGNOSIS — I110 Hypertensive heart disease with heart failure: Secondary | ICD-10-CM | POA: Diagnosis not present

## 2016-04-09 DIAGNOSIS — L89214 Pressure ulcer of right hip, stage 4: Secondary | ICD-10-CM | POA: Diagnosis not present

## 2016-04-09 DIAGNOSIS — E440 Moderate protein-calorie malnutrition: Secondary | ICD-10-CM | POA: Diagnosis present

## 2016-04-09 DIAGNOSIS — E876 Hypokalemia: Secondary | ICD-10-CM | POA: Diagnosis not present

## 2016-04-09 DIAGNOSIS — E785 Hyperlipidemia, unspecified: Secondary | ICD-10-CM | POA: Diagnosis not present

## 2016-04-09 DIAGNOSIS — E1122 Type 2 diabetes mellitus with diabetic chronic kidney disease: Secondary | ICD-10-CM | POA: Diagnosis not present

## 2016-04-09 DIAGNOSIS — Z794 Long term (current) use of insulin: Secondary | ICD-10-CM | POA: Diagnosis not present

## 2016-04-09 DIAGNOSIS — R0602 Shortness of breath: Secondary | ICD-10-CM | POA: Diagnosis not present

## 2016-04-09 DIAGNOSIS — T83518A Infection and inflammatory reaction due to other urinary catheter, initial encounter: Secondary | ICD-10-CM | POA: Diagnosis present

## 2016-04-09 DIAGNOSIS — G35 Multiple sclerosis: Secondary | ICD-10-CM | POA: Diagnosis not present

## 2016-04-09 DIAGNOSIS — J189 Pneumonia, unspecified organism: Secondary | ICD-10-CM | POA: Diagnosis present

## 2016-04-09 DIAGNOSIS — M729 Fibroblastic disorder, unspecified: Secondary | ICD-10-CM | POA: Diagnosis not present

## 2016-04-09 DIAGNOSIS — R112 Nausea with vomiting, unspecified: Secondary | ICD-10-CM | POA: Diagnosis not present

## 2016-04-09 DIAGNOSIS — R3989 Other symptoms and signs involving the genitourinary system: Secondary | ICD-10-CM | POA: Diagnosis not present

## 2016-04-09 DIAGNOSIS — I10 Essential (primary) hypertension: Secondary | ICD-10-CM | POA: Diagnosis not present

## 2016-04-09 DIAGNOSIS — L8992 Pressure ulcer of unspecified site, stage 2: Secondary | ICD-10-CM | POA: Diagnosis not present

## 2016-04-09 DIAGNOSIS — I130 Hypertensive heart and chronic kidney disease with heart failure and stage 1 through stage 4 chronic kidney disease, or unspecified chronic kidney disease: Secondary | ICD-10-CM | POA: Diagnosis not present

## 2016-04-09 DIAGNOSIS — E1129 Type 2 diabetes mellitus with other diabetic kidney complication: Secondary | ICD-10-CM | POA: Diagnosis not present

## 2016-04-09 DIAGNOSIS — F3289 Other specified depressive episodes: Secondary | ICD-10-CM | POA: Diagnosis not present

## 2016-04-09 DIAGNOSIS — R319 Hematuria, unspecified: Secondary | ICD-10-CM | POA: Diagnosis not present

## 2016-04-09 DIAGNOSIS — R410 Disorientation, unspecified: Secondary | ICD-10-CM | POA: Diagnosis not present

## 2016-04-09 DIAGNOSIS — I428 Other cardiomyopathies: Secondary | ICD-10-CM | POA: Diagnosis not present

## 2016-04-09 DIAGNOSIS — N170 Acute kidney failure with tubular necrosis: Secondary | ICD-10-CM | POA: Diagnosis not present

## 2016-04-09 DIAGNOSIS — I120 Hypertensive chronic kidney disease with stage 5 chronic kidney disease or end stage renal disease: Secondary | ICD-10-CM | POA: Diagnosis not present

## 2016-04-09 DIAGNOSIS — Z22322 Carrier or suspected carrier of Methicillin resistant Staphylococcus aureus: Secondary | ICD-10-CM | POA: Diagnosis not present

## 2016-04-09 DIAGNOSIS — L89323 Pressure ulcer of left buttock, stage 3: Secondary | ICD-10-CM | POA: Diagnosis present

## 2016-04-09 DIAGNOSIS — Z23 Encounter for immunization: Secondary | ICD-10-CM | POA: Diagnosis not present

## 2016-04-09 DIAGNOSIS — K920 Hematemesis: Secondary | ICD-10-CM | POA: Diagnosis not present

## 2016-04-09 DIAGNOSIS — L89159 Pressure ulcer of sacral region, unspecified stage: Secondary | ICD-10-CM | POA: Diagnosis not present

## 2016-04-09 DIAGNOSIS — I5021 Acute systolic (congestive) heart failure: Secondary | ICD-10-CM | POA: Diagnosis not present

## 2016-04-09 DIAGNOSIS — A4902 Methicillin resistant Staphylococcus aureus infection, unspecified site: Secondary | ICD-10-CM | POA: Diagnosis not present

## 2016-04-09 DIAGNOSIS — D638 Anemia in other chronic diseases classified elsewhere: Secondary | ICD-10-CM | POA: Diagnosis not present

## 2016-04-09 DIAGNOSIS — K226 Gastro-esophageal laceration-hemorrhage syndrome: Secondary | ICD-10-CM | POA: Diagnosis present

## 2016-04-09 DIAGNOSIS — N390 Urinary tract infection, site not specified: Secondary | ICD-10-CM | POA: Diagnosis not present

## 2016-04-09 DIAGNOSIS — L89314 Pressure ulcer of right buttock, stage 4: Secondary | ICD-10-CM | POA: Diagnosis present

## 2016-04-09 DIAGNOSIS — M869 Osteomyelitis, unspecified: Secondary | ICD-10-CM | POA: Diagnosis not present

## 2016-04-09 DIAGNOSIS — L89154 Pressure ulcer of sacral region, stage 4: Secondary | ICD-10-CM | POA: Diagnosis not present

## 2016-04-09 DIAGNOSIS — N179 Acute kidney failure, unspecified: Secondary | ICD-10-CM | POA: Diagnosis not present

## 2016-04-09 DIAGNOSIS — M898X9 Other specified disorders of bone, unspecified site: Secondary | ICD-10-CM | POA: Diagnosis present

## 2016-04-09 DIAGNOSIS — N183 Chronic kidney disease, stage 3 (moderate): Secondary | ICD-10-CM | POA: Diagnosis not present

## 2016-04-09 DIAGNOSIS — R069 Unspecified abnormalities of breathing: Secondary | ICD-10-CM | POA: Diagnosis not present

## 2016-04-09 DIAGNOSIS — N319 Neuromuscular dysfunction of bladder, unspecified: Secondary | ICD-10-CM | POA: Diagnosis present

## 2016-04-09 DIAGNOSIS — M792 Neuralgia and neuritis, unspecified: Secondary | ICD-10-CM | POA: Diagnosis not present

## 2016-04-09 DIAGNOSIS — N2581 Secondary hyperparathyroidism of renal origin: Secondary | ICD-10-CM | POA: Diagnosis present

## 2016-04-09 DIAGNOSIS — K921 Melena: Secondary | ICD-10-CM | POA: Diagnosis not present

## 2016-04-09 DIAGNOSIS — M6281 Muscle weakness (generalized): Secondary | ICD-10-CM | POA: Diagnosis not present

## 2016-04-09 DIAGNOSIS — D649 Anemia, unspecified: Secondary | ICD-10-CM | POA: Diagnosis not present

## 2016-04-09 DIAGNOSIS — R310 Gross hematuria: Secondary | ICD-10-CM | POA: Diagnosis present

## 2016-04-09 DIAGNOSIS — G8250 Quadriplegia, unspecified: Secondary | ICD-10-CM | POA: Diagnosis present

## 2016-04-09 DIAGNOSIS — Z66 Do not resuscitate: Secondary | ICD-10-CM | POA: Diagnosis present

## 2016-04-09 LAB — CBC
HEMATOCRIT: 23.5 % — AB (ref 39.0–52.0)
Hemoglobin: 7.5 g/dL — ABNORMAL LOW (ref 13.0–17.0)
MCH: 32.1 pg (ref 26.0–34.0)
MCHC: 31.9 g/dL (ref 30.0–36.0)
MCV: 100.4 fL — AB (ref 78.0–100.0)
PLATELETS: 203 10*3/uL (ref 150–400)
RBC: 2.34 MIL/uL — ABNORMAL LOW (ref 4.22–5.81)
RDW: 16.7 % — AB (ref 11.5–15.5)
WBC: 6.5 10*3/uL (ref 4.0–10.5)

## 2016-04-09 LAB — PREPARE RBC (CROSSMATCH)

## 2016-04-10 DIAGNOSIS — N186 End stage renal disease: Secondary | ICD-10-CM | POA: Diagnosis not present

## 2016-04-10 DIAGNOSIS — Z23 Encounter for immunization: Secondary | ICD-10-CM | POA: Diagnosis not present

## 2016-04-10 DIAGNOSIS — D631 Anemia in chronic kidney disease: Secondary | ICD-10-CM | POA: Diagnosis not present

## 2016-04-10 DIAGNOSIS — E1129 Type 2 diabetes mellitus with other diabetic kidney complication: Secondary | ICD-10-CM | POA: Diagnosis not present

## 2016-04-10 LAB — TYPE AND SCREEN
ABO/RH(D): A POS
Antibody Screen: NEGATIVE
UNIT DIVISION: 0

## 2016-04-10 LAB — BASIC METABOLIC PANEL
BUN: 45 mg/dL — AB (ref 4–21)
Creatinine: 4.9 mg/dL — AB (ref 0.6–1.3)
GLUCOSE: 136 mg/dL
POTASSIUM: 4 mmol/L (ref 3.4–5.3)
SODIUM: 137 mmol/L (ref 137–147)

## 2016-04-10 LAB — HEMOGLOBIN A1C: Hemoglobin A1C: 6.1

## 2016-04-10 LAB — CBC AND DIFFERENTIAL
HCT: 24 % — AB (ref 41–53)
Hemoglobin: 7.5 g/dL — AB (ref 13.5–17.5)
Platelets: 203 10*3/uL (ref 150–399)
WBC: 6.5 10*3/mL

## 2016-04-13 DIAGNOSIS — N186 End stage renal disease: Secondary | ICD-10-CM | POA: Diagnosis not present

## 2016-04-13 DIAGNOSIS — D631 Anemia in chronic kidney disease: Secondary | ICD-10-CM | POA: Diagnosis not present

## 2016-04-13 DIAGNOSIS — Z23 Encounter for immunization: Secondary | ICD-10-CM | POA: Diagnosis not present

## 2016-04-13 DIAGNOSIS — E1129 Type 2 diabetes mellitus with other diabetic kidney complication: Secondary | ICD-10-CM | POA: Diagnosis not present

## 2016-04-14 ENCOUNTER — Non-Acute Institutional Stay (SKILLED_NURSING_FACILITY): Payer: Medicare Other | Admitting: Internal Medicine

## 2016-04-14 ENCOUNTER — Encounter: Payer: Self-pay | Admitting: Internal Medicine

## 2016-04-14 DIAGNOSIS — N183 Chronic kidney disease, stage 3 (moderate): Secondary | ICD-10-CM | POA: Diagnosis not present

## 2016-04-14 DIAGNOSIS — I5021 Acute systolic (congestive) heart failure: Secondary | ICD-10-CM

## 2016-04-14 DIAGNOSIS — I428 Other cardiomyopathies: Secondary | ICD-10-CM

## 2016-04-14 DIAGNOSIS — F329 Major depressive disorder, single episode, unspecified: Secondary | ICD-10-CM | POA: Diagnosis not present

## 2016-04-14 DIAGNOSIS — N170 Acute kidney failure with tubular necrosis: Secondary | ICD-10-CM

## 2016-04-14 NOTE — Progress Notes (Signed)
Nursing Home Location: [facility] Room Number: [place], [place] [id number]   Code Status: Full Code  This is a readmission note to [facility] performed on this date, greater than 30 days since SNF discharge.  Included are preadmission medical/surgical history;reconciled medication list; family history; social history and comprehensive review of systems.  Corrections and additions to the records were documented . Comprehensive physical exam was also performed. Additionally a clinical summary was entered for each active diagnosis pertinent to this admission in the Problem List to enhance continuity of care.  HPI: The patient was hospitalized 10/12-10/26/17 with acute systolic heart failure. That discharge summary was reviewed. Pulmonary edema radiographically was in the context of creatinine elevation to 3.3. BNP was greater than 900. Troponin was also elevated, attributed to demand ischemia. Echo revealed 35-40 percent ejection fraction with moderate diffuse hypokinesis mild-moderate mitral regurgitation, and grade 1 diastolic dysfunction. He was felt to have acute, nonischemic cardiomyopathy with acute respiratory failure requiring BiPAP for he was treated in the ICU. He has underlying spastic paraplegia from multiple sclerosis He was found to have severe protein caloric malnutrition. He received sliding scale insulin for elevated sugars. His course was complicated by a decubitus stage IV ulcer associated with osteomyelitis. He underwent debridement and received antibiotics. He was transferred from the hospital to [facility] for the wound care 10/26-12/7/17.  He was being treated for pelvic osteomyelitis with daptomycin until 11/27. Also he receives dialysis Monday, Wednesday, and Friday. Chronic indwelling Foley catheter is in place.  Possible suppressive therapy with either nitrofurantoin or Bactrim has  not been initiated although it was considered. On 04/10/16 creatinine was 4.9. He's exhibited a significant anemia which for  the most part has been normochromic, normocytic; but on 12/7 MCV was 100.4. Most recent hemoglobin/hematocrit were 7.5 and 24. Diabetes is well controlled with an A1c of 6.1% on low-dose basal insulin at bedtime.  Past medical and surgical history: Reviewed  Social history: Review  Family history: Reviewed  Review of systems: He has 2 active concerns. He is having pain and some oozing of blood at the site of the heparin injections. He is inquiring into other options in place of the heparin. He describes some posterior neck pain on the  right or left intermittently during dialysis.  He reluctantly admits to being "down" about his present situation. He is reluctant to take an antidepressant.  He categorically denies any active cardiopulmonary symptoms. In reviewing the history prior to the admission for acute systolic heart failure, he noted that he had the acute onset of shortness of breath without any other prodrome or obvious etiology.  Constitutional: No fever,significant weight change, fatigue  Eyes: No redness, discharge, pain, vision change ENT/mouth: No nasal congestion,  purulent discharge, earache,change in hearing ,sore throat  Cardiovascular: No chest pain, palpitations,paroxysmal nocturnal dyspnea, claudication, edema  Respiratory: No cough, sputum production,hemoptysis, DOE , significant snoring,apnea  Gastrointestinal: No heartburn,dysphagia,abdominal pain, nausea / vomiting,rectal bleeding, melena,change in bowels Genitourinary: No dysuria,hematuria, pyuria,  incontinence, nocturia Musculoskeletal: No joint stiffness, joint swelling, weakness,pain Dermatologic: No rash, pruritus, change in appearance of skin Neurologic: No dizziness,headache,syncope, seizures, numbness , tingling Endocrine: No change in hair/skin/ nails, excessive thirst, excessive hunger,  excessive urination  Hematologic/lymphatic: No lymphadenopathy Allergy/immunology: No itchy/ watery eyes, significant sneezing, urticaria, angioedema  Physical exam:  Pertinent or positive findings: pattern alopecia is present. He has thinning of the lateral eyebrows. Heart sounds  are dramatically distant. Sounds are also decreased.  A catheter is present in right upper chest. He has scattered circular bruises over the abdomen. There is no active bleeding at any of the sites. Posterior tibial pulses are decreased. He has minimal movement of the lower extremities. With stimulation of the left lower extremity, the left toe is upgoing. There is wasting of the interosseous muscles.   General appearance:Adequately nourished; no acute distress , increased work of breathing is present.   Lymphatic: No lymphadenopathy about the head, neck, axilla . Eyes: No conjunctival inflammation or lid edema is present. There is no scleral icterus. Ears:  External ear exam shows no significant lesions or deformities.   Nose:  External nasal examination shows no deformity or inflammation. Nasal mucosa are pink and moist without lesions ,exudates Oral exam: lips and gums are healthy appearing.There is no oropharyngeal erythema or exudate . Neck:  No thyromegaly, masses, tenderness noted.    Lungs:Chest clear to auscultation without wheezes, rhonchi,rales , rubs. Abdomen:Bowel sounds are normal. Abdomen is soft and nontender with no organomegaly, hernias,masses. GU: deferred ; Foley in place  Extremities:  No cyanosis, clubbing,edema  Neurologic exam : Strength equal  in upper extremities Balance,Rhomberg,finger to nose testing could not be completed due to clinical state Deep tendon reflexes are equal in UE Skin: Warm & dry w/o tenting. No significant rash.  See clinical summary under each active problem in the Problem List with associated updated therapeutic plan

## 2016-04-14 NOTE — Assessment & Plan Note (Signed)
Dialysis Monday, Wednesday, and Friday Nutrition consult as he is concerned about phosphorus content of some foods

## 2016-04-14 NOTE — Patient Instructions (Signed)
See Current Assessment & Plan in Problem List under specific Diagnosis 

## 2016-04-14 NOTE — Assessment & Plan Note (Signed)
At this time the patient declines any medication. The rationale for low-dose SSRI was discussed.

## 2016-04-14 NOTE — Assessment & Plan Note (Signed)
Continue sodium restriction. Adjust diuretic therapy as needed for progression of diastolic dysfunction.

## 2016-04-14 NOTE — Assessment & Plan Note (Signed)
Continue diuretics and carvedilol

## 2016-04-15 DIAGNOSIS — N186 End stage renal disease: Secondary | ICD-10-CM | POA: Diagnosis not present

## 2016-04-15 DIAGNOSIS — Z23 Encounter for immunization: Secondary | ICD-10-CM | POA: Diagnosis not present

## 2016-04-15 DIAGNOSIS — D631 Anemia in chronic kidney disease: Secondary | ICD-10-CM | POA: Diagnosis not present

## 2016-04-15 DIAGNOSIS — E1129 Type 2 diabetes mellitus with other diabetic kidney complication: Secondary | ICD-10-CM | POA: Diagnosis not present

## 2016-04-16 DIAGNOSIS — L89214 Pressure ulcer of right hip, stage 4: Secondary | ICD-10-CM | POA: Diagnosis not present

## 2016-04-17 DIAGNOSIS — Z23 Encounter for immunization: Secondary | ICD-10-CM | POA: Diagnosis not present

## 2016-04-17 DIAGNOSIS — N186 End stage renal disease: Secondary | ICD-10-CM | POA: Diagnosis not present

## 2016-04-17 DIAGNOSIS — E1129 Type 2 diabetes mellitus with other diabetic kidney complication: Secondary | ICD-10-CM | POA: Diagnosis not present

## 2016-04-17 DIAGNOSIS — D631 Anemia in chronic kidney disease: Secondary | ICD-10-CM | POA: Diagnosis not present

## 2016-04-20 ENCOUNTER — Non-Acute Institutional Stay (SKILLED_NURSING_FACILITY): Payer: Medicare Other | Admitting: Internal Medicine

## 2016-04-20 ENCOUNTER — Encounter: Payer: Self-pay | Admitting: Internal Medicine

## 2016-04-20 DIAGNOSIS — G35 Multiple sclerosis: Secondary | ICD-10-CM

## 2016-04-20 DIAGNOSIS — R410 Disorientation, unspecified: Secondary | ICD-10-CM

## 2016-04-20 DIAGNOSIS — N170 Acute kidney failure with tubular necrosis: Secondary | ICD-10-CM | POA: Diagnosis not present

## 2016-04-20 DIAGNOSIS — N183 Chronic kidney disease, stage 3 unspecified: Secondary | ICD-10-CM

## 2016-04-20 DIAGNOSIS — R369 Urethral discharge, unspecified: Secondary | ICD-10-CM

## 2016-04-20 LAB — HEPATIC FUNCTION PANEL
ALT: 7 U/L — AB (ref 10–40)
AST: 10 U/L — AB (ref 14–40)
Alkaline Phosphatase: 68 U/L (ref 25–125)
Bilirubin, Total: 0.3 mg/dL

## 2016-04-20 LAB — BASIC METABOLIC PANEL
BUN: 62 mg/dL — AB (ref 4–21)
CREATININE: 6 mg/dL — AB (ref 0.6–1.3)
GLUCOSE: 125 mg/dL
POTASSIUM: 4 mmol/L (ref 3.4–5.3)
SODIUM: 143 mmol/L (ref 137–147)

## 2016-04-20 LAB — CBC AND DIFFERENTIAL
HCT: 30 % — AB (ref 41–53)
HEMOGLOBIN: 9.6 g/dL — AB (ref 13.5–17.5)
Platelets: 221 10*3/uL (ref 150–399)
WBC: 6.4 10^3/mL

## 2016-04-20 NOTE — Progress Notes (Signed)
This is a nursing facility follow up for specific acute issue of mental status changes Interim medical record and care since last [place] visit was updated with review of diagnostic studies and change in clinical status since last visit were documented.  HPI: Nursing noted repetitive behavior manifested as rubbing his head and then his chest and stating "something is wrong with me". This lasted approximately 1 hour. At this time he is medically clear and oriented. He realizes that he was "somewhat disoriented" today. He denied any neuro or cardiac prodrome but did have "buzzing" in his ears. He also felt as if he had some pressure in his ears. There was no other change in cranial nerve function. At this time he has a urinalysis and culture and sensitivity pending as he had some discharge with foul odor around his Foley catheter on 12/16. He denies any GU symptoms. Over the weekend he did refuses heparin once. He denies any significant bleeding with the heparin. Labs revealed mild hyperglycemia with glucose 125. Creatinine was 5.99 and BUN 61.7. He did not go to dialysis because of mental status changes as I was informed that there was no caregiver at the dialysis today. White count 6400 with normal differential. His anemia has improved, hematocrit 29.8. Last value was 24. Copies of labs were provided for him to take to dialysis 12/19.  Review of systems: Constitutional: No fever,significant weight change, fatigue  Eyes: No redness, discharge, pain, vision change ENT/mouth: No nasal congestion,  purulent discharge, change in hearing ,sore throat  Cardiovascular: No chest pain, palpitations,paroxysmal nocturnal dyspnea, claudication, edema  Respiratory: No cough, sputum production,hemoptysis, DOE , significant snoring,apnea  Gastrointestinal: No heartburn,dysphagia,abdominal pain, nausea / vomiting,rectal bleeding, melena,change in bowels Genitourinary: No dysuria,hematuria, pyuria,   incontinence, nocturia Musculoskeletal: No joint stiffness, joint swelling, weakness,pain Dermatologic: No rash, pruritus, change in appearance of skin Neurologic: No dizziness,headache,syncope, seizures, numbness , tingling Psychiatric: No significant anxiety , depression, insomnia, anorexia Endocrine: No change in hair/skin/ nails, excessive thirst, excessive hunger, excessive urination  Hematologic/lymphatic: No lymphadenopathy,abnormal bleeding Allergy/immunology: No itchy/ watery eyes, significant sneezing, urticaria, angioedema  Physical exam:  Pertinent or positive findings: he is oriented but responds slowly.  The left nasolabial fold is asymmetric. His wife states that this is a chronic finding.  Heart sounds are distant and slightly irregular. He has a dialysis catheter at the right upper chest.   he has essentially no movement in the lower extremities. The lower extremities are in soft cast. Pedal pulses are decreased. Wound nurse states that the wound is improving and shows no signs of purulence.   General appearance:Adequately nourished; no acute distress , increased work of breathing is present.   Lymphatic: No lymphadenopathy about the head, neck, axilla . Eyes: No conjunctival inflammation or lid edema is present. There is no scleral icterus. Ears:  External ear exam shows no significant lesions or deformities. Hearing intact bilaterally to whisper at 6 feet.  Nose:  External nasal examination shows no deformity or inflammation. Nasal mucosa are pink and moist without lesions ,exudates Oral exam: lips and gums are healthy appearing.There is no oropharyngeal erythema or exudate . Neck:  No thyromegaly, masses, tenderness noted.    Heart:  No gallop, murmur, click, rub .  Lungs:Chest clear to auscultation without wheezes, rhonchi,rales , rubs. Abdomen:Bowel sounds are normal. Abdomen is soft and nontender with no organomegaly, hernias,masses. GU: deferred  Extremities:  No  cyanosis, clubbing,edema  Neurologic exam : Cn 2-7 intact grossly Balance,Rhomberg,finger to  nose testing could not be completed due to clinical state Skin: Warm & dry w/o tenting. No significant  rash.  #1 acute mental status changes, essentially resolved. No cranial nerve deficit present. Neurologic deficits present are pre-existing and related to his MS.  Plan: antibiotics will be started empirically should he have a fever. He will continue to be monitored for recurrent mental status changes

## 2016-04-20 NOTE — Patient Instructions (Addendum)
See Current Assessment & Plan in Problem List under specific Diagnosis 

## 2016-04-21 ENCOUNTER — Encounter: Payer: Self-pay | Admitting: *Deleted

## 2016-04-21 ENCOUNTER — Other Ambulatory Visit: Payer: Self-pay | Admitting: Internal Medicine

## 2016-04-21 DIAGNOSIS — R4182 Altered mental status, unspecified: Secondary | ICD-10-CM | POA: Insufficient documentation

## 2016-04-21 DIAGNOSIS — D631 Anemia in chronic kidney disease: Secondary | ICD-10-CM | POA: Diagnosis not present

## 2016-04-21 DIAGNOSIS — N186 End stage renal disease: Secondary | ICD-10-CM | POA: Diagnosis not present

## 2016-04-21 DIAGNOSIS — Z23 Encounter for immunization: Secondary | ICD-10-CM | POA: Diagnosis not present

## 2016-04-21 DIAGNOSIS — R404 Transient alteration of awareness: Secondary | ICD-10-CM

## 2016-04-21 DIAGNOSIS — E1129 Type 2 diabetes mellitus with other diabetic kidney complication: Secondary | ICD-10-CM | POA: Diagnosis not present

## 2016-04-21 NOTE — Assessment & Plan Note (Signed)
04/20/16 dialysis held because of mental status changes, to be resumed as per dialysis facility Copies of labs provided for dialysis facility

## 2016-04-21 NOTE — Assessment & Plan Note (Signed)
04/20/16 no new neuromuscular deficits as per exam

## 2016-04-21 NOTE — Assessment & Plan Note (Signed)
Neurology  consultation. 

## 2016-04-22 DIAGNOSIS — N186 End stage renal disease: Secondary | ICD-10-CM | POA: Diagnosis not present

## 2016-04-22 DIAGNOSIS — E1129 Type 2 diabetes mellitus with other diabetic kidney complication: Secondary | ICD-10-CM | POA: Diagnosis not present

## 2016-04-22 DIAGNOSIS — D631 Anemia in chronic kidney disease: Secondary | ICD-10-CM | POA: Diagnosis not present

## 2016-04-22 DIAGNOSIS — Z23 Encounter for immunization: Secondary | ICD-10-CM | POA: Diagnosis not present

## 2016-04-23 ENCOUNTER — Encounter: Payer: Self-pay | Admitting: Internal Medicine

## 2016-04-23 ENCOUNTER — Non-Acute Institutional Stay (SKILLED_NURSING_FACILITY): Payer: Medicare Other | Admitting: Internal Medicine

## 2016-04-23 DIAGNOSIS — F329 Major depressive disorder, single episode, unspecified: Secondary | ICD-10-CM

## 2016-04-23 DIAGNOSIS — N390 Urinary tract infection, site not specified: Secondary | ICD-10-CM

## 2016-04-23 NOTE — Assessment & Plan Note (Signed)
Cipro 500 mg twice a day 7 days

## 2016-04-23 NOTE — Patient Instructions (Signed)
See Current Assessment & Plan in Problem List under specific Diagnosis 

## 2016-04-23 NOTE — Progress Notes (Signed)
    Facility Location: [facility]  Room Number: 465   Code Status: Full Code   This is a nursing facility follow up for specific acute issue of mental status changes in context of UTI.  Interim medical record and care since last [place] visit was updated with review of diagnostic studies and change in clinical status since last visit were documented.  HPI: The patient has exhibited noncompliance with his medical therapy and refusing vital signs since last night. He makes statements such as " they should just have let me go in the hospital". He was seen 12/18 for some mental status changes which cleared within an hour. There were no new localizing neurologic signs beyond those due to his MS.Because of the mental status changes, dialysis was postponed. Labs drawn revealed a creatinine of 5.99 & BUN 61.7. His hematocrit was 29.8, significantly improved from a value of 24 on 12/8. He was empirically placed on nitrofurantoin pending the final culture results. This revealed greater than 100,000 colonies of Klebsiella pneumoniae intermediately sensitive to the nitrofurantoin. He was changed to Cipro 500 mg twice a day, but as noted has been refusing meds. Dialysis has been resumed.  Review of systems: He now admits to being profoundly depressed wondering how he will go on. He denies any new neurologic signs. Constitutional: No fever,significant weight change, fatigue  Eyes: No redness, discharge, pain, vision change ENT/mouth: No nasal congestion,  purulent discharge, earache,change in hearing ,sore throat  Cardiovascular: No chest pain, palpitations,paroxysmal nocturnal dyspnea, claudication, edema  Respiratory: No cough, sputum production,hemoptysis, DOE , significant snoring,apnea   Gastrointestinal: No heartburn,dysphagia,abdominal pain, nausea / vomiting,rectal bleeding, melena,change in bowels Genitourinary: No dysuria,hematuria, pyuria,   incontinence, nocturia Dermatologic: No rash, pruritus, change in appearance of skin Neurologic: No new dizziness,headache,syncope, seizures, numbness , tingling  Physical exam:  Pertinent or positive findings: He appears profoundly depressed, he is constantly frowning. He has pattern alopecia. He has intermittent slight ptosis. The left nasolabial fold is slightly decreased. He is symmetrically weak in the upper extremities. Again he is unable to move the lower extremities  General appearance:Adequately nourished; no acute distress , increased work of breathing is present.   Lymphatic: No lymphadenopathy about the head, neck, axilla . Eyes: No conjunctival inflammation or lid edema is present. There is no scleral icterus. Ears:  External ear exam shows no significant lesions or deformities.   Nose:  External nasal examination shows no deformity or inflammation. Nasal mucosa are pink and moist without lesions ,exudates Oral exam: lips and gums are healthy appearing.There is no oropharyngeal erythema or exudate . Neck:  No thyromegaly, masses, tenderness noted.    Heart:  Normal rate and regular rhythm. S1 and S2 normal without gallop, murmur, click, rub .  Lungs:Breath sounds decreased.Chest clear to auscultation without wheezes, rhonchi,rales , rubs. Abdomen:Bowel sounds are normal. Abdomen is soft and nontender with no organomegaly, hernias,masses. GU: Foley present Extremities:  No cyanosis, clubbing,edema  Neurologic exam : Cn 2-7 intact except for nasolabial fold changes which is chronic according to his wife. Extraocular motor function and field of vision intact. Sensation to light touch normal over the face  Balance,Rhomberg,finger to nose testing could not be completed due to clinical state Skin: Warm & dry w/o tenting. No significant lesions or rash.    See summary under each active problem in the Problem List with associated updated therapeutic plan

## 2016-04-23 NOTE — Assessment & Plan Note (Signed)
Sertraline will be initiated and titrated based on clinical response

## 2016-04-24 ENCOUNTER — Encounter: Payer: Self-pay | Admitting: Vascular Surgery

## 2016-04-24 DIAGNOSIS — E1129 Type 2 diabetes mellitus with other diabetic kidney complication: Secondary | ICD-10-CM | POA: Diagnosis not present

## 2016-04-24 DIAGNOSIS — Z23 Encounter for immunization: Secondary | ICD-10-CM | POA: Diagnosis not present

## 2016-04-24 DIAGNOSIS — D631 Anemia in chronic kidney disease: Secondary | ICD-10-CM | POA: Diagnosis not present

## 2016-04-24 DIAGNOSIS — N186 End stage renal disease: Secondary | ICD-10-CM | POA: Diagnosis not present

## 2016-04-26 DIAGNOSIS — N186 End stage renal disease: Secondary | ICD-10-CM | POA: Diagnosis not present

## 2016-04-26 DIAGNOSIS — Z23 Encounter for immunization: Secondary | ICD-10-CM | POA: Diagnosis not present

## 2016-04-26 DIAGNOSIS — E1129 Type 2 diabetes mellitus with other diabetic kidney complication: Secondary | ICD-10-CM | POA: Diagnosis not present

## 2016-04-26 DIAGNOSIS — D631 Anemia in chronic kidney disease: Secondary | ICD-10-CM | POA: Diagnosis not present

## 2016-04-28 ENCOUNTER — Telehealth: Payer: Self-pay | Admitting: Vascular Surgery

## 2016-04-28 NOTE — Telephone Encounter (Signed)
-----   Message from Mena Goes, RN sent at 04/28/2016  1:47 PM EST ----- Regarding: FW: Appt   ----- Message ----- From: Donzetta Matters Sent: 04/28/2016   1:09 PM To: [facility] Clinical Pool Subject: Appt                                           Hilda Blades from [facility] called regarding this patient's appointment. He is currently scheduled 1/5. He has dialysis MWF and they are limited with transportation. Would it be ok to move his appointment to 1/24?

## 2016-04-28 NOTE — Telephone Encounter (Signed)
Spoke to [place] from [facility] to resch appt to 05/27/16 at 9:30.

## 2016-04-29 DIAGNOSIS — Z23 Encounter for immunization: Secondary | ICD-10-CM | POA: Diagnosis not present

## 2016-04-29 DIAGNOSIS — D631 Anemia in chronic kidney disease: Secondary | ICD-10-CM | POA: Diagnosis not present

## 2016-04-29 DIAGNOSIS — N186 End stage renal disease: Secondary | ICD-10-CM | POA: Diagnosis not present

## 2016-04-29 DIAGNOSIS — E1129 Type 2 diabetes mellitus with other diabetic kidney complication: Secondary | ICD-10-CM | POA: Diagnosis not present

## 2016-04-30 DIAGNOSIS — L89214 Pressure ulcer of right hip, stage 4: Secondary | ICD-10-CM | POA: Diagnosis not present

## 2016-05-01 DIAGNOSIS — Z23 Encounter for immunization: Secondary | ICD-10-CM | POA: Diagnosis not present

## 2016-05-01 DIAGNOSIS — E1129 Type 2 diabetes mellitus with other diabetic kidney complication: Secondary | ICD-10-CM | POA: Diagnosis not present

## 2016-05-01 DIAGNOSIS — N186 End stage renal disease: Secondary | ICD-10-CM | POA: Diagnosis not present

## 2016-05-01 DIAGNOSIS — D631 Anemia in chronic kidney disease: Secondary | ICD-10-CM | POA: Diagnosis not present

## 2016-05-03 DIAGNOSIS — N186 End stage renal disease: Secondary | ICD-10-CM | POA: Diagnosis not present

## 2016-05-03 DIAGNOSIS — E1122 Type 2 diabetes mellitus with diabetic chronic kidney disease: Secondary | ICD-10-CM | POA: Diagnosis not present

## 2016-05-03 DIAGNOSIS — E1129 Type 2 diabetes mellitus with other diabetic kidney complication: Secondary | ICD-10-CM | POA: Diagnosis not present

## 2016-05-03 DIAGNOSIS — D631 Anemia in chronic kidney disease: Secondary | ICD-10-CM | POA: Diagnosis not present

## 2016-05-03 DIAGNOSIS — Z23 Encounter for immunization: Secondary | ICD-10-CM | POA: Diagnosis not present

## 2016-05-03 DIAGNOSIS — Z992 Dependence on renal dialysis: Secondary | ICD-10-CM | POA: Diagnosis not present

## 2016-05-06 DIAGNOSIS — D509 Iron deficiency anemia, unspecified: Secondary | ICD-10-CM | POA: Diagnosis not present

## 2016-05-06 DIAGNOSIS — D631 Anemia in chronic kidney disease: Secondary | ICD-10-CM | POA: Diagnosis not present

## 2016-05-06 DIAGNOSIS — Z23 Encounter for immunization: Secondary | ICD-10-CM | POA: Diagnosis not present

## 2016-05-06 DIAGNOSIS — E1129 Type 2 diabetes mellitus with other diabetic kidney complication: Secondary | ICD-10-CM | POA: Diagnosis not present

## 2016-05-06 DIAGNOSIS — N186 End stage renal disease: Secondary | ICD-10-CM | POA: Diagnosis not present

## 2016-05-06 DIAGNOSIS — N2581 Secondary hyperparathyroidism of renal origin: Secondary | ICD-10-CM | POA: Diagnosis not present

## 2016-05-07 ENCOUNTER — Non-Acute Institutional Stay (SKILLED_NURSING_FACILITY): Payer: Medicare Other | Admitting: Internal Medicine

## 2016-05-07 ENCOUNTER — Encounter: Payer: Self-pay | Admitting: Internal Medicine

## 2016-05-07 DIAGNOSIS — R3989 Other symptoms and signs involving the genitourinary system: Secondary | ICD-10-CM | POA: Diagnosis not present

## 2016-05-07 DIAGNOSIS — F329 Major depressive disorder, single episode, unspecified: Secondary | ICD-10-CM

## 2016-05-07 DIAGNOSIS — R112 Nausea with vomiting, unspecified: Secondary | ICD-10-CM | POA: Insufficient documentation

## 2016-05-07 DIAGNOSIS — R195 Other fecal abnormalities: Secondary | ICD-10-CM | POA: Diagnosis not present

## 2016-05-07 NOTE — Patient Instructions (Signed)
See Current Assessment & Plan

## 2016-05-07 NOTE — Progress Notes (Signed)
   [facility] Room:224  [name], MD [place] [id number]  This is a nursing facility follow up for specific acute issue of nausea and vomiting & loose stools.  Interim medical record and care since last [place] visit was updated with review of diagnostic studies and change in clinical status since last visit were documented.  HPI: After returning from dialysis yesterday he exhibited  Nausea & vomiting 3 and loose stools 2. Clear liquid diet for 24 hours was ordered by the on-call physician with subjective improvement. He denies any active GI, GU, or pulmonary / upper respiratory tract symptoms He has had minor epistaxis in the context of Lovenox prophylaxis. Wound care nurse reports no change in the skin lesions with no evidence of cellulitis or purulence.  Review of systems:Constitutional: No fever,significant weight change, fatigue  Eyes: No redness, discharge, pain, vision change ENT/mouth: No nasal congestion,  purulent discharge, earache,change in hearing ,sore throat  Cardiovascular: No chest pain, palpitations,paroxysmal nocturnal dyspnea, claudication, edema  Respiratory: No cough, sputum production,hemoptysis, DOE , significant snoring,apnea  Gastrointestinal: No heartburn,dysphagia,abdominal pain, nausea / vomiting,rectal bleeding, melena,change in bowels Genitourinary: No dysuria,hematuria, pyuria,  incontinence, nocturia Musculoskeletal: No joint stiffness, joint swelling Dermatologic: No new rash, pruritus, change in appearance of skin Neurologic: No dizziness,headache,syncope, seizures, numbness , tingling Psychiatric: No significant anxiety , depression, insomnia, anorexia Endocrine: No change in hair/skin/ nails, excessive thirst, excessive hunger, excessive urination  Hematologic/lymphatic: No lymphadenopathy,abnormal bleeding Allergy/immunology: No itchy/ watery eyes, significant sneezing, urticaria,  angioedema  Physical exam:  Pertinent or positive findings: Pattern alopecia is present. He is lethargic with a markedly flat affect. Decreased L nasolabial fold. Nares are dry. He has thick  oropharyngeal secretions which are clear. He has scattered mild bruising of the abdomen. Lower extremities are in booties. Pedal pulses are decreased.Trace pedal edema. Urine in the Foley bag is dark orange red.  General appearance:Adequately nourished; no acute distress , increased work of breathing is present.   Lymphatic: No lymphadenopathy about the head, neck, axilla . Eyes: No conjunctival inflammation or lid edema is present. There is no scleral icterus. Ears:  External ear exam shows no significant lesions or deformities.   Nose:  External nasal examination shows no deformity or inflammation. Nasal mucosa are pink and moist without lesions ,exudates Oral exam: lips and gums are healthy appearing.There is no oropharyngeal erythema or exudate . Neck:  No thyromegaly, masses, tenderness noted.    Heart:  Normal rate and regular rhythm. S1 and S2 normal without gallop, murmur, click, rub .  Lungs:Chest clear to auscultation anteriorly without wheezes, rhonchi,rales , rubs. Abdomen:Bowel sounds are normal. Abdomen is soft and nontender with no organomegaly, hernias,masses. GU: deferred  Extremities:  No cyanosis, clubbing Neurologic  Balance,Rhomberg,finger to nose testing could not be completed due to neuromuscular weakness especially in legs Skin: Warm & dry w/o tenting. No significant  rash.  #1 nausea and vomiting 2 #2 loose stools without frank diarrhea #3 dark orange red urine in Foley bag #4 depression Plan: Urine for culture and sensitivity Probiotic for loose stool Continue antidepressant; consider psychiatry consult if depression fails to respond Request copy of any labs at dialysis 05/08/16

## 2016-05-08 ENCOUNTER — Encounter: Payer: Medicare Other | Admitting: Vascular Surgery

## 2016-05-09 ENCOUNTER — Inpatient Hospital Stay (HOSPITAL_COMMUNITY)
Admission: EM | Admit: 2016-05-09 | Discharge: 2016-05-18 | DRG: 871 | Disposition: A | Discharge status: Skilled Nursing Facility | Payer: Medicare Other | Attending: Internal Medicine | Admitting: Internal Medicine

## 2016-05-09 ENCOUNTER — Emergency Department (HOSPITAL_COMMUNITY): Payer: Medicare Other

## 2016-05-09 ENCOUNTER — Inpatient Hospital Stay (HOSPITAL_COMMUNITY): Payer: Medicare Other

## 2016-05-09 ENCOUNTER — Encounter (HOSPITAL_COMMUNITY): Payer: Self-pay | Admitting: Emergency Medicine

## 2016-05-09 DIAGNOSIS — Z79899 Other long term (current) drug therapy: Secondary | ICD-10-CM

## 2016-05-09 DIAGNOSIS — D631 Anemia in chronic kidney disease: Secondary | ICD-10-CM | POA: Diagnosis present

## 2016-05-09 DIAGNOSIS — R05 Cough: Secondary | ICD-10-CM | POA: Diagnosis not present

## 2016-05-09 DIAGNOSIS — I10 Essential (primary) hypertension: Secondary | ICD-10-CM | POA: Diagnosis not present

## 2016-05-09 DIAGNOSIS — E1122 Type 2 diabetes mellitus with diabetic chronic kidney disease: Secondary | ICD-10-CM | POA: Diagnosis not present

## 2016-05-09 DIAGNOSIS — G8250 Quadriplegia, unspecified: Secondary | ICD-10-CM | POA: Diagnosis present

## 2016-05-09 DIAGNOSIS — I428 Other cardiomyopathies: Secondary | ICD-10-CM | POA: Diagnosis present

## 2016-05-09 DIAGNOSIS — J9690 Respiratory failure, unspecified, unspecified whether with hypoxia or hypercapnia: Secondary | ICD-10-CM | POA: Diagnosis present

## 2016-05-09 DIAGNOSIS — J9601 Acute respiratory failure with hypoxia: Secondary | ICD-10-CM | POA: Diagnosis present

## 2016-05-09 DIAGNOSIS — K920 Hematemesis: Secondary | ICD-10-CM | POA: Diagnosis present

## 2016-05-09 DIAGNOSIS — I110 Hypertensive heart disease with heart failure: Secondary | ICD-10-CM | POA: Diagnosis not present

## 2016-05-09 DIAGNOSIS — Z87891 Personal history of nicotine dependence: Secondary | ICD-10-CM

## 2016-05-09 DIAGNOSIS — R6510 Systemic inflammatory response syndrome (SIRS) of non-infectious origin without acute organ dysfunction: Secondary | ICD-10-CM | POA: Diagnosis present

## 2016-05-09 DIAGNOSIS — N319 Neuromuscular dysfunction of bladder, unspecified: Secondary | ICD-10-CM | POA: Diagnosis present

## 2016-05-09 DIAGNOSIS — A419 Sepsis, unspecified organism: Secondary | ICD-10-CM | POA: Diagnosis not present

## 2016-05-09 DIAGNOSIS — F329 Major depressive disorder, single episode, unspecified: Secondary | ICD-10-CM | POA: Diagnosis not present

## 2016-05-09 DIAGNOSIS — Y95 Nosocomial condition: Secondary | ICD-10-CM | POA: Diagnosis present

## 2016-05-09 DIAGNOSIS — R0602 Shortness of breath: Secondary | ICD-10-CM | POA: Diagnosis not present

## 2016-05-09 DIAGNOSIS — R0902 Hypoxemia: Secondary | ICD-10-CM | POA: Diagnosis not present

## 2016-05-09 DIAGNOSIS — K921 Melena: Secondary | ICD-10-CM | POA: Diagnosis not present

## 2016-05-09 DIAGNOSIS — B9562 Methicillin resistant Staphylococcus aureus infection as the cause of diseases classified elsewhere: Secondary | ICD-10-CM | POA: Diagnosis present

## 2016-05-09 DIAGNOSIS — E440 Moderate protein-calorie malnutrition: Secondary | ICD-10-CM | POA: Diagnosis present

## 2016-05-09 DIAGNOSIS — L89152 Pressure ulcer of sacral region, stage 2: Secondary | ICD-10-CM | POA: Diagnosis not present

## 2016-05-09 DIAGNOSIS — Z6823 Body mass index (BMI) 23.0-23.9, adult: Secondary | ICD-10-CM

## 2016-05-09 DIAGNOSIS — I501 Left ventricular failure: Secondary | ICD-10-CM

## 2016-05-09 DIAGNOSIS — R319 Hematuria, unspecified: Secondary | ICD-10-CM | POA: Diagnosis not present

## 2016-05-09 DIAGNOSIS — Z66 Do not resuscitate: Secondary | ICD-10-CM | POA: Diagnosis present

## 2016-05-09 DIAGNOSIS — J189 Pneumonia, unspecified organism: Secondary | ICD-10-CM | POA: Diagnosis present

## 2016-05-09 DIAGNOSIS — R1312 Dysphagia, oropharyngeal phase: Secondary | ICD-10-CM | POA: Diagnosis not present

## 2016-05-09 DIAGNOSIS — N2581 Secondary hyperparathyroidism of renal origin: Secondary | ICD-10-CM | POA: Diagnosis present

## 2016-05-09 DIAGNOSIS — R112 Nausea with vomiting, unspecified: Secondary | ICD-10-CM | POA: Diagnosis not present

## 2016-05-09 DIAGNOSIS — Z993 Dependence on wheelchair: Secondary | ICD-10-CM

## 2016-05-09 DIAGNOSIS — G35 Multiple sclerosis: Secondary | ICD-10-CM | POA: Diagnosis present

## 2016-05-09 DIAGNOSIS — K226 Gastro-esophageal laceration-hemorrhage syndrome: Secondary | ICD-10-CM | POA: Diagnosis present

## 2016-05-09 DIAGNOSIS — L89323 Pressure ulcer of left buttock, stage 3: Secondary | ICD-10-CM | POA: Diagnosis present

## 2016-05-09 DIAGNOSIS — L89314 Pressure ulcer of right buttock, stage 4: Secondary | ICD-10-CM | POA: Diagnosis present

## 2016-05-09 DIAGNOSIS — T83518A Infection and inflammatory reaction due to other urinary catheter, initial encounter: Secondary | ICD-10-CM | POA: Diagnosis present

## 2016-05-09 DIAGNOSIS — A4902 Methicillin resistant Staphylococcus aureus infection, unspecified site: Secondary | ICD-10-CM | POA: Diagnosis not present

## 2016-05-09 DIAGNOSIS — Z992 Dependence on renal dialysis: Secondary | ICD-10-CM | POA: Diagnosis not present

## 2016-05-09 DIAGNOSIS — N186 End stage renal disease: Secondary | ICD-10-CM | POA: Diagnosis not present

## 2016-05-09 DIAGNOSIS — L89159 Pressure ulcer of sacral region, unspecified stage: Secondary | ICD-10-CM | POA: Diagnosis present

## 2016-05-09 DIAGNOSIS — Z1612 Extended spectrum beta lactamase (ESBL) resistance: Secondary | ICD-10-CM | POA: Diagnosis present

## 2016-05-09 DIAGNOSIS — R310 Gross hematuria: Secondary | ICD-10-CM | POA: Diagnosis present

## 2016-05-09 DIAGNOSIS — F32A Depression, unspecified: Secondary | ICD-10-CM | POA: Diagnosis present

## 2016-05-09 DIAGNOSIS — N390 Urinary tract infection, site not specified: Secondary | ICD-10-CM | POA: Diagnosis present

## 2016-05-09 DIAGNOSIS — M898X9 Other specified disorders of bone, unspecified site: Secondary | ICD-10-CM | POA: Diagnosis present

## 2016-05-09 DIAGNOSIS — J80 Acute respiratory distress syndrome: Secondary | ICD-10-CM | POA: Diagnosis not present

## 2016-05-09 DIAGNOSIS — D638 Anemia in other chronic diseases classified elsewhere: Secondary | ICD-10-CM | POA: Diagnosis not present

## 2016-05-09 DIAGNOSIS — L89322 Pressure ulcer of left buttock, stage 2: Secondary | ICD-10-CM | POA: Diagnosis not present

## 2016-05-09 DIAGNOSIS — E1129 Type 2 diabetes mellitus with other diabetic kidney complication: Secondary | ICD-10-CM | POA: Diagnosis present

## 2016-05-09 DIAGNOSIS — B961 Klebsiella pneumoniae [K. pneumoniae] as the cause of diseases classified elsewhere: Secondary | ICD-10-CM | POA: Diagnosis present

## 2016-05-09 DIAGNOSIS — E876 Hypokalemia: Secondary | ICD-10-CM | POA: Diagnosis present

## 2016-05-09 DIAGNOSIS — J181 Lobar pneumonia, unspecified organism: Secondary | ICD-10-CM | POA: Diagnosis not present

## 2016-05-09 DIAGNOSIS — R488 Other symbolic dysfunctions: Secondary | ICD-10-CM | POA: Diagnosis not present

## 2016-05-09 DIAGNOSIS — Z7982 Long term (current) use of aspirin: Secondary | ICD-10-CM

## 2016-05-09 DIAGNOSIS — Z794 Long term (current) use of insulin: Secondary | ICD-10-CM | POA: Diagnosis not present

## 2016-05-09 DIAGNOSIS — R069 Unspecified abnormalities of breathing: Secondary | ICD-10-CM | POA: Diagnosis not present

## 2016-05-09 DIAGNOSIS — N183 Chronic kidney disease, stage 3 (moderate): Secondary | ICD-10-CM | POA: Diagnosis not present

## 2016-05-09 DIAGNOSIS — R531 Weakness: Secondary | ICD-10-CM | POA: Diagnosis not present

## 2016-05-09 DIAGNOSIS — Z87440 Personal history of urinary (tract) infections: Secondary | ICD-10-CM

## 2016-05-09 DIAGNOSIS — M6281 Muscle weakness (generalized): Secondary | ICD-10-CM | POA: Diagnosis not present

## 2016-05-09 DIAGNOSIS — Z8614 Personal history of Methicillin resistant Staphylococcus aureus infection: Secondary | ICD-10-CM

## 2016-05-09 DIAGNOSIS — F3289 Other specified depressive episodes: Secondary | ICD-10-CM | POA: Diagnosis not present

## 2016-05-09 DIAGNOSIS — I120 Hypertensive chronic kidney disease with stage 5 chronic kidney disease or end stage renal disease: Secondary | ICD-10-CM | POA: Diagnosis not present

## 2016-05-09 LAB — POC OCCULT BLOOD, ED: FECAL OCCULT BLD: POSITIVE — AB

## 2016-05-09 LAB — CBC WITH DIFFERENTIAL/PLATELET
BASOS PCT: 0 %
Basophils Absolute: 0 10*3/uL (ref 0.0–0.1)
EOS ABS: 0.2 10*3/uL (ref 0.0–0.7)
Eosinophils Relative: 2 %
HCT: 29.8 % — ABNORMAL LOW (ref 39.0–52.0)
HEMOGLOBIN: 9.4 g/dL — AB (ref 13.0–17.0)
Lymphocytes Relative: 10 %
Lymphs Abs: 0.9 10*3/uL (ref 0.7–4.0)
MCH: 30.8 pg (ref 26.0–34.0)
MCHC: 31.5 g/dL (ref 30.0–36.0)
MCV: 97.7 fL (ref 78.0–100.0)
MONOS PCT: 7 %
Monocytes Absolute: 0.7 10*3/uL (ref 0.1–1.0)
NEUTROS PCT: 81 %
Neutro Abs: 7.7 10*3/uL (ref 1.7–7.7)
Platelets: 199 10*3/uL (ref 150–400)
RBC: 3.05 MIL/uL — AB (ref 4.22–5.81)
RDW: 16.6 % — ABNORMAL HIGH (ref 11.5–15.5)
WBC: 9.5 10*3/uL (ref 4.0–10.5)

## 2016-05-09 LAB — URINALYSIS, ROUTINE W REFLEX MICROSCOPIC
Bilirubin Urine: NEGATIVE
Glucose, UA: NEGATIVE mg/dL
Ketones, ur: NEGATIVE mg/dL
Nitrite: NEGATIVE
Protein, ur: 100 mg/dL — AB
SPECIFIC GRAVITY, URINE: 1.012 (ref 1.005–1.030)
Squamous Epithelial / LPF: NONE SEEN
pH: 8 (ref 5.0–8.0)

## 2016-05-09 LAB — I-STAT CG4 LACTIC ACID, ED
LACTIC ACID, VENOUS: 1.46 mmol/L (ref 0.5–1.9)
LACTIC ACID, VENOUS: 1.49 mmol/L (ref 0.5–1.9)

## 2016-05-09 LAB — I-STAT ARTERIAL BLOOD GAS, ED
ACID-BASE EXCESS: 4 mmol/L — AB (ref 0.0–2.0)
Bicarbonate: 27.6 mmol/L (ref 20.0–28.0)
O2 SAT: 93 %
PCO2 ART: 39.2 mmHg (ref 32.0–48.0)
PH ART: 7.459 — AB (ref 7.350–7.450)
TCO2: 29 mmol/L (ref 0–100)
pO2, Arterial: 67 mmHg — ABNORMAL LOW (ref 83.0–108.0)

## 2016-05-09 LAB — COMPREHENSIVE METABOLIC PANEL
ALK PHOS: 67 U/L (ref 38–126)
ALT: 13 U/L — AB (ref 17–63)
AST: 16 U/L (ref 15–41)
Albumin: 2.1 g/dL — ABNORMAL LOW (ref 3.5–5.0)
Anion gap: 16 — ABNORMAL HIGH (ref 5–15)
BILIRUBIN TOTAL: 0.8 mg/dL (ref 0.3–1.2)
BUN: 62 mg/dL — ABNORMAL HIGH (ref 6–20)
CALCIUM: 8.3 mg/dL — AB (ref 8.9–10.3)
CO2: 26 mmol/L (ref 22–32)
CREATININE: 6.87 mg/dL — AB (ref 0.61–1.24)
Chloride: 93 mmol/L — ABNORMAL LOW (ref 101–111)
GFR, EST AFRICAN AMERICAN: 8 mL/min — AB (ref >60)
GFR, EST NON AFRICAN AMERICAN: 7 mL/min — AB (ref >60)
Glucose, Bld: 117 mg/dL — ABNORMAL HIGH (ref 65–99)
Potassium: 3.9 mmol/L (ref 3.5–5.1)
Sodium: 135 mmol/L (ref 135–145)
TOTAL PROTEIN: 7.3 g/dL (ref 6.5–8.1)

## 2016-05-09 LAB — GLUCOSE, CAPILLARY: GLUCOSE-CAPILLARY: 101 mg/dL — AB (ref 65–99)

## 2016-05-09 LAB — HEMOGLOBIN AND HEMATOCRIT, BLOOD
HCT: 31.3 % — ABNORMAL LOW (ref 39.0–52.0)
HEMOGLOBIN: 10 g/dL — AB (ref 13.0–17.0)

## 2016-05-09 LAB — TYPE AND SCREEN
ABO/RH(D): A POS
ANTIBODY SCREEN: NEGATIVE

## 2016-05-09 LAB — MRSA PCR SCREENING: MRSA BY PCR: POSITIVE — AB

## 2016-05-09 LAB — PROTIME-INR
INR: 1.1
PROTHROMBIN TIME: 14.2 s (ref 11.4–15.2)

## 2016-05-09 MED ORDER — INSULIN ASPART 100 UNIT/ML ~~LOC~~ SOLN
0.0000 [IU] | SUBCUTANEOUS | Status: DC
  Administered 2016-05-10: 3 [IU] via SUBCUTANEOUS
  Administered 2016-05-10: 2 [IU] via SUBCUTANEOUS
  Administered 2016-05-10: 3 [IU] via SUBCUTANEOUS
  Administered 2016-05-10: 2 [IU] via SUBCUTANEOUS
  Administered 2016-05-11 – 2016-05-12 (×2): 3 [IU] via SUBCUTANEOUS
  Administered 2016-05-12: 2 [IU] via SUBCUTANEOUS
  Administered 2016-05-12 – 2016-05-13 (×5): 3 [IU] via SUBCUTANEOUS
  Administered 2016-05-13 – 2016-05-14 (×4): 2 [IU] via SUBCUTANEOUS
  Administered 2016-05-14: 3 [IU] via SUBCUTANEOUS
  Administered 2016-05-14: 2 [IU] via SUBCUTANEOUS
  Administered 2016-05-14 – 2016-05-15 (×2): 3 [IU] via SUBCUTANEOUS
  Administered 2016-05-15: 2 [IU] via SUBCUTANEOUS
  Administered 2016-05-16: 3 [IU] via SUBCUTANEOUS
  Administered 2016-05-16 – 2016-05-17 (×2): 2 [IU] via SUBCUTANEOUS
  Administered 2016-05-17 – 2016-05-18 (×3): 3 [IU] via SUBCUTANEOUS

## 2016-05-09 MED ORDER — CARVEDILOL 3.125 MG PO TABS
3.1250 mg | ORAL_TABLET | Freq: Two times a day (BID) | ORAL | Status: DC
  Administered 2016-05-10 (×2): 3.125 mg via ORAL
  Filled 2016-05-09 (×2): qty 1

## 2016-05-09 MED ORDER — VANCOMYCIN HCL 10 G IV SOLR
1750.0000 mg | Freq: Once | INTRAVENOUS | Status: AC
  Administered 2016-05-09: 1750 mg via INTRAVENOUS
  Filled 2016-05-09: qty 1750

## 2016-05-09 MED ORDER — ACETAMINOPHEN 325 MG PO TABS
650.0000 mg | ORAL_TABLET | Freq: Four times a day (QID) | ORAL | Status: DC | PRN
  Administered 2016-05-09: 650 mg via ORAL
  Filled 2016-05-09: qty 2

## 2016-05-09 MED ORDER — PANTOPRAZOLE SODIUM 40 MG IV SOLR
40.0000 mg | Freq: Two times a day (BID) | INTRAVENOUS | Status: DC
  Administered 2016-05-10 – 2016-05-12 (×6): 40 mg via INTRAVENOUS
  Filled 2016-05-09 (×7): qty 40

## 2016-05-09 MED ORDER — MELATONIN 3 MG PO TABS
3.0000 mg | ORAL_TABLET | Freq: Every day | ORAL | Status: DC
  Administered 2016-05-10 – 2016-05-17 (×8): 3 mg via ORAL
  Filled 2016-05-09 (×10): qty 1

## 2016-05-09 MED ORDER — SODIUM CHLORIDE 0.9 % IV SOLN
62.5000 mg | INTRAVENOUS | Status: DC
  Administered 2016-05-13: 62.5 mg via INTRAVENOUS
  Filled 2016-05-09: qty 5

## 2016-05-09 MED ORDER — SACCHAROMYCES BOULARDII 250 MG PO CAPS
250.0000 mg | ORAL_CAPSULE | Freq: Two times a day (BID) | ORAL | Status: DC
  Administered 2016-05-10 – 2016-05-18 (×16): 250 mg via ORAL
  Filled 2016-05-09 (×17): qty 1

## 2016-05-09 MED ORDER — ONDANSETRON HCL 4 MG PO TABS
4.0000 mg | ORAL_TABLET | Freq: Four times a day (QID) | ORAL | Status: DC | PRN
  Administered 2016-05-18: 4 mg via ORAL
  Filled 2016-05-09: qty 1

## 2016-05-09 MED ORDER — VANCOMYCIN HCL IN DEXTROSE 750-5 MG/150ML-% IV SOLN
750.0000 mg | Freq: Once | INTRAVENOUS | Status: DC
  Filled 2016-05-09: qty 150

## 2016-05-09 MED ORDER — FUROSEMIDE 40 MG PO TABS
40.0000 mg | ORAL_TABLET | Freq: Two times a day (BID) | ORAL | Status: DC
  Administered 2016-05-10: 40 mg via ORAL
  Filled 2016-05-09: qty 1

## 2016-05-09 MED ORDER — SERTRALINE HCL 50 MG PO TABS
50.0000 mg | ORAL_TABLET | Freq: Every day | ORAL | Status: DC
  Administered 2016-05-10 – 2016-05-18 (×9): 50 mg via ORAL
  Filled 2016-05-09 (×9): qty 1

## 2016-05-09 MED ORDER — OXYBUTYNIN CHLORIDE 5 MG PO TABS
5.0000 mg | ORAL_TABLET | Freq: Two times a day (BID) | ORAL | Status: DC
  Administered 2016-05-10 – 2016-05-18 (×17): 5 mg via ORAL
  Filled 2016-05-09 (×17): qty 1

## 2016-05-09 MED ORDER — ASPIRIN 81 MG PO CHEW
81.0000 mg | CHEWABLE_TABLET | Freq: Every day | ORAL | Status: DC
  Administered 2016-05-10: 81 mg via ORAL
  Filled 2016-05-09: qty 1

## 2016-05-09 MED ORDER — DEXTROSE 5 % IV SOLN: 2.0000 g | Freq: Once | INTRAVENOUS | Status: DC

## 2016-05-09 MED ORDER — VANCOMYCIN HCL IN DEXTROSE 750-5 MG/150ML-% IV SOLN
750.0000 mg | INTRAVENOUS | Status: AC
  Administered 2016-05-10: 750 mg via INTRAVENOUS
  Filled 2016-05-09: qty 150

## 2016-05-09 MED ORDER — ONDANSETRON HCL 4 MG/2ML IJ SOLN
4.0000 mg | Freq: Four times a day (QID) | INTRAMUSCULAR | Status: DC | PRN
  Filled 2016-05-09: qty 2

## 2016-05-09 MED ORDER — DEXTROSE 5 % IV SOLN
2.0000 g | Freq: Once | INTRAVENOUS | Status: AC
  Administered 2016-05-09: 2 g via INTRAVENOUS
  Filled 2016-05-09: qty 2

## 2016-05-09 MED ORDER — DEXTROSE 5 % IV SOLN
1.0000 g | INTRAVENOUS | Status: DC
  Administered 2016-05-10 (×2): 1 g via INTRAVENOUS
  Filled 2016-05-09 (×2): qty 1

## 2016-05-09 MED ORDER — VANCOMYCIN HCL IN DEXTROSE 1-5 GM/200ML-% IV SOLN
1000.0000 mg | Freq: Once | INTRAVENOUS | Status: DC
  Filled 2016-05-09: qty 200

## 2016-05-09 MED ORDER — ACETAMINOPHEN 650 MG RE SUPP: 650.0000 mg | Freq: Four times a day (QID) | RECTAL | Status: DC | PRN

## 2016-05-09 MED ORDER — ASPIRIN 81 MG PO TABS: 81.0000 mg | ORAL_TABLET | Freq: Every day | ORAL | Status: DC

## 2016-05-09 NOTE — H&P (Signed)
[facility] History and Physical  BRENTLY VOORHIS JME:[id number] [name]: 1940/07/27 DOA: 05/09/2016  Referring physician:  PCP: [name], MD   Chief Complaint: "I threw up blood."  HPI: Lance [name] is a 76 y.o. male  with past mental history significant for congestive heart failure end-stage renal disease recurrent UTIs and multiple sclerosis presents emergency room from [facility] with complaints of vomiting, diarrhea, shortness of breath. Patient was brought by EMS after was found out that his O2 saturation was 77%. Of note patient did miss his dialysis treatment yesterday. Patient reportedly had some nausea and vomiting yesterday. The physician at his facility was working him up for urinary tract infection. He had not been given any antibiotics. Patient reports that with his episodes of vomiting there was some blood.  ED course: Patient started on vancomycin and Zosyn. Patient placed on BiPAP due to hypoxia. Patient had a bloody melanotic stool. Patient also appeared to have blood on UA and in bedside urinal. Report given that patient was on heparin at his facility to prevent DVTs. INR was normal and initial lactic acid was normal.   Review of Systems:  As per HPI otherwise 10 point review of systems negative.    Past Medical History:  Diagnosis Date  . Acute CHF (congestive heart failure) ([place]) 02/14/2016  . Acute osteomyelitis, pelvis, right ([place])   . Acute renal failure ([place])   . Anemia in chronic renal disease   . Blood transfusion   . Cardiomyopathy ([place]) 02/17/2016  . Chronic spastic paraplegia (HCC)    secondary to MS  . CKD (chronic kidney disease), stage III   . Decreased sensation of lower extremity    due to MS  . Decubitus ulcer of ischium   . Decubitus ulcer of trochanter, stage 4 ([place]) 06/14/2015  . History of MRSA infection    urine  . History of recurrent UTIs   . Hypertension   . MS (multiple sclerosis) ([place])   .  Neuralgia neuritis, sciatic nerve    MS for 30 years  . Neurogenic bladder   . PONV (postoperative nausea and vomiting)   . Self-catheterizes urinary bladder   . Type 2 diabetes mellitus ([place])    Past Surgical History:  Procedure Laterality Date  . APPLICATION OF A-CELL OF EXTREMITY Left 09/05/2014   Procedure: PLACEMENT OF ACELL AND VAC;  Surgeon: Theodoro Kos, DO;  Location: [place];  Service: Plastics;  Laterality: Left;  . BASCILIC VEIN TRANSPOSITION Right 03/24/2016   Procedure: RADIAL- CEPHALIC FISTULA CREATION RIGHT ARM;  Surgeon: Conrad [place], MD;  Location: [place];  Service: Vascular;  Laterality: Right;  . EXTRACORPOREAL SHOCK WAVE LITHOTRIPSY  03-23-2011  . FINGER SURGERY  2004  . INCISION AND DRAINAGE OF WOUND Left 09/05/2014   Procedure: IRRIGATION AND DEBRIDEMENT OF LEFT ISCHIUM WOUND WITH ;  Surgeon: Theodoro Kos, DO;  Location: [place];  Service: Plastics;  Laterality: Left;  . INSERTION OF DIALYSIS CATHETER N/A 02/27/2016   Procedure: INSERTION OF DIALYSIS CATHETER-RIGHT INTERNAL JUGULA PLACEMENT;  Surgeon: Conrad [place], MD;  Location: [place];  Service: Vascular;  Laterality: N/A;  . TONSILLECTOMY    . WOUND DEBRIDEMENT     [facility]2/17[facility]    Social History:  reports that he quit smoking about 33 years ago. His smoking use included Cigars. He has never used smokeless tobacco. He reports that he drinks about 1.8 oz of alcohol per week . He reports that he does not use drugs.  Allergies  Allergen Reactions  . No Known Allergies     Family History  Problem Relation Age of Onset  . Stroke Father   . Diabetes Father   . Diabetes Paternal Grandmother   . Stroke Paternal Grandfather   . Heart disease Neg Hx   . Cancer Neg Hx      Prior to Admission medications   Medication Sig Start Date End Date Taking? Authorizing Provider  acetaminophen (TYLENOL) 325 MG tablet Take 650 mg by mouth every 6 (six) hours as needed.   Yes Historical Provider, MD  Amino Acids-Protein Hydrolys  (FEEDING SUPPLEMENT, PRO-STAT SUGAR FREE 64,) LIQD Take 76ml by mouth twice daily for wound healing   Yes Historical Provider, MD  aspirin 81 MG tablet Take 1 tablet (81 mg total) by mouth daily. 03/30/11  Yes Rolan Bucco, MD  b complex-vitamin c-folic acid (NEPHRO-VITE) 0.8 MG TABS tablet Take 1 tablet by mouth at bedtime.   Yes Historical Provider, MD  carvedilol (COREG) 3.125 MG tablet Take 3.125 mg by mouth 2 (two) times daily with a meal.   Yes Historical Provider, MD  famotidine (PEPCID) 20 MG tablet Take 20 mg by mouth 2 (two) times daily.   Yes Historical Provider, MD  ferric citrate (AURYXIA) 1 GM 210 MG(Fe) tablet Take 420 mg by mouth daily before breakfast.   Yes Historical Provider, MD  ferrous sulfate 325 (65 FE) MG tablet Take 325 mg by mouth 2 (two) times daily with a meal.   Yes Historical Provider, MD  furosemide (LASIX) 40 MG tablet Take 40 mg by mouth 2 (two) times daily.   Yes Historical Provider, MD  heparin 5000 UNIT/ML injection Inject 5,000 Units into the skin every 8 (eight) hours.   Yes Historical Provider, MD  insulin glargine (LANTUS) 100 UNIT/ML injection Inject 12 Units into the skin at bedtime.   Yes Historical Provider, MD  Melatonin 3 MG TABS Take 3 mg by mouth at bedtime.   Yes Historical Provider, MD  NON FORMULARY Drink 1 can of Nephro Steady twice daily.   Yes Historical Provider, MD  Nutritional Supplements (NEPRO) LIQD One can by mouth twice daily for supplement/wound healing   Yes Historical Provider, MD  oxybutynin (DITROPAN) 5 MG tablet Take 5 mg by mouth 2 (two) times daily.    Yes Historical Provider, MD  polyethylene glycol (MIRALAX / GLYCOLAX) packet Take 17 g by mouth daily as needed for mild constipation. 02/27/16  Yes Nita Sells, MD  promethazine (PHENERGAN) 25 MG tablet Take 25 mg by mouth every 6 (six) hours as needed for nausea or vomiting.   Yes Historical Provider, MD  saccharomyces boulardii (FLORASTOR) 250 MG capsule Take 250 mg by  mouth 2 (two) times daily. For 7 days; started on 05-08-15   Yes Historical Provider, MD  sertraline (ZOLOFT) 50 MG tablet Take 50 mg by mouth daily.  04/23/16  Yes Hendricks Limes, MD  traMADol (ULTRAM) 50 MG tablet Take 50 mg by mouth every 6 (six) hours as needed.    Yes Historical Provider, MD  baclofen (LIORESAL) 10 MG tablet Take 10 mg by mouth at bedtime as needed for muscle spasms.    Historical Provider, MD   Physical Exam: Vitals:   05/09/16 1615 05/09/16 1630 05/09/16 1645 05/09/16 1700  BP: 151/56 (!) 149/52 (!) 138/50 (!) 145/39  Pulse: 87 85 83 91  Resp: (!) 27 24 19 21   Temp:      TempSrc:  SpO2: 100% 100% 100% 100%  Weight:      Height:        Wt Readings from Last 3 Encounters:  05/09/16 77.1 kg (170 lb)  05/07/16 77.6 kg (171 lb)  04/23/16 77.6 kg (171 lb)    General:  Appears calm and comfortable, Ill-appearing, alert oriented 3 Eyes:  PERRL, EOMI, normal lids, iris ENT:  grossly normal hearing, lips & tongue Neck:  no LAD, masses or thyromegaly Cardiovascular:  RRR, no m/r/g. No LE edema.  Respiratory:  On BiPAP, decreased air movement, crackles at bases Abdomen:  soft, ntnd Skin:  no rash or induration seen on limited exam Musculoskeletal:  grossly normal tone BUE and contractures in lower extr Psychiatric:  grossly normal mood and affect, speech fluent and appropriate Neurologic:  CN 2-12 grossly intact, moves all extremities in coordinated fashion.          Labs on Admission:  Basic Metabolic Panel:  Recent Labs Lab 05/09/16 1151  NA 135  K 3.9  CL 93*  CO2 26  GLUCOSE 117*  BUN 62*  CREATININE 6.87*  CALCIUM 8.3*   Liver Function Tests:  Recent Labs Lab 05/09/16 1151  AST 16  ALT 13*  ALKPHOS 67  BILITOT 0.8  PROT 7.3  ALBUMIN 2.1*   No results for input(s): LIPASE, AMYLASE in the last 168 hours. No results for input(s): AMMONIA in the last 168 hours. CBC:  Recent Labs Lab 05/09/16 1151  WBC 9.5  NEUTROABS 7.7  HGB  9.4*  HCT 29.8*  MCV 97.7  PLT 199   Cardiac Enzymes: No results for input(s): CKTOTAL, CKMB, CKMBINDEX, TROPONINI in the last 168 hours.  BNP (last 3 results)  Recent Labs  02/14/16 0020  BNP 927.0*    ProBNP (last 3 results) No results for input(s): PROBNP in the last 8760 hours.   Serum creatinine: 6.87 mg/dL High 05/09/16 1151 Estimated creatinine clearance: 9.6 mL/min  CBG: No results for input(s): GLUCAP in the last 168 hours.  Radiological Exams on Admission: Dg Chest Port 1 View  Result Date: 05/09/2016 CLINICAL DATA:  76 year old male with shortness of breath and cough for the past 2 days EXAM: PORTABLE CHEST 1 VIEW COMPARISON:  Prior chest x-ray 02/27/2016 FINDINGS: Stable position of right IJ approach tunneled hemodialysis catheter. The catheter tips overlie the superior cavoatrial junction. Stable borderline cardiomegaly. Slightly increased pulmonary vascular congestion likely reflecting mild interstitial edema. Nonspecific left lower lobe airspace opacity obscures the diaphragm. IMPRESSION: 1. Nonspecific left basilar airspace opacity, likely within the left lower lobe. Differential considerations include pleural fluid with atelectasis and/or infiltrate. Consider PA and lateral chest x-ray for further differentiation. 2. Slightly increased pulmonary vascular congestion now with mild interstitial edema. 3. Stable cardiomegaly. 4. Stable right IJ approach tunneled hemodialysis catheter. Electronically Signed   By: Jacqulynn Cadet M.D.   On: 05/09/2016 11:48    EKG: Independently reviewed. Ventricular rate 94, PR interval 100, QRS duration 101, QTC 454; sinus rhythm, PACs, no STEMI-when compared to 10-17 EKG no significant ST changes  Assessment/Plan Principal Problem:   Respiratory failure (HCC) Active Problems:   Sacral decubitus ulcer   DM (diabetes mellitus) type II controlled with renal manifestation (HCC)   Depression   SIRS (systemic inflammatory response  syndrome) (HCC)   Melena   Hematemesis   SIRS 2/2 pna/uti Patient hemodynamically stable Given maxipime emergency room, will continue Given vanc in the emergency room, will continue Urine culture pending Blood cultures 2 pending Patient given fluid  with meds in the emergency room Lactic acid normal UA grossly positive Culture sent Sputum cult ordered CXR in AM  ESRD Nephro consult in for HD Cont lasix  GI bleed  Last known normal value 9.6, 9.4 today Will trend Protonix BID 40mg  Consider GI consult in AM Based on hx likely due to meds  Depression No SI/HI Cont zoloft  CHF Cont coreg  Gross hematuria Inr normal Avoid blood thinners Renal U/S ordered  Sacral decub Wound care consult  DM  SSI q4 checks Hold lantus  Bladder dysfunction Continue ditropan  Code Status: DNR  DVT Prophylaxis: SCD Family Communication: wife and son at bedside Disposition Plan: Pending Improvement  Status: inpt sdu  Elwin Mocha, MD Family Medicine [facility] [email or web address] Password [email or web address]

## 2016-05-09 NOTE — Consult Note (Signed)
[facility] Renal Consultation Note  Indication for Consultation:  Management of ESRD/hemodialysis; anemia, hypertension/volume and secondary hyperparathyroidism  HPI: Lance [name] is a 76 y.o. male ESRD  presumed sec DM  Hd start 02/14/16  [facility] admit[facility] -dc 04/09/16[facility] (last HD was wed 1/03)complex med problems including multple sclerosis /wheelchr Boind Quadriplegia/ R Ischial Pressure ulcer with Osteo/Neuro genic Bladder with  Foley/2d ech EF 35-40% mod MR now presented from [facility] with with  sob/ cough x 2 days, ams reported yesterday with fever of 100.42F, missed Dialysis session yest per EMS pt had  77% on RA, placed on NRB >sats up to 94%.. Now OX4 CXR =mild interstitial edema/Nonspecific left basilar airspace opacity k =3.9 ,, hgb 9.4 and place on NRB mask, bp 123/51.We are consulted for HD / ESRD issues and HD ordered for today. Noted at OP uhit leaving below edw and tolerating , establishing lower edw.He has a perm. Cath for HD and RFA AVF slowly developing  ( 03/2716 inserted Dr. Bridgett Larsson). Appears comfortable in ER currently on Nonrebreathing Mask and wife and son at bedside.Noted on NH medlist has Baclofen 10mg   qhs prn, ultram 50mg   q 6hr prn , Oxybutynim 5mg  bid , Zoloft 50mg  qday which could also cause some AMS in ESRD pt (SHOULD Not be on Baclofen sec.  ESRD . ) Pt pleasant and as conversive as he can be on a NRB- abx being infused      Past Medical History:  Diagnosis Date  . Acute CHF (congestive heart failure) ([place]) 02/14/2016  . Acute osteomyelitis, pelvis, right ([place])   . Acute renal failure ([place])   . Anemia in chronic renal disease   . Blood transfusion   . Cardiomyopathy ([place]) 02/17/2016  . Chronic spastic paraplegia (HCC)    secondary to MS  . CKD (chronic kidney disease), stage III   . Decreased sensation of lower extremity    due to MS  . Decubitus ulcer of ischium   . Decubitus ulcer of trochanter, stage  4 ([place]) 06/14/2015  . History of MRSA infection    urine  . History of recurrent UTIs   . Hypertension   . MS (multiple sclerosis) ([place])   . Neuralgia neuritis, sciatic nerve    MS for 30 years  . Neurogenic bladder   . PONV (postoperative nausea and vomiting)   . Self-catheterizes urinary bladder   . Type 2 diabetes mellitus ([place])     Past Surgical History:  Procedure Laterality Date  . APPLICATION OF A-CELL OF EXTREMITY Left 09/05/2014   Procedure: PLACEMENT OF ACELL AND VAC;  Surgeon: Theodoro Kos, DO;  Location: [place];  Service: Plastics;  Laterality: Left;  . BASCILIC VEIN TRANSPOSITION Right 03/24/2016   Procedure: RADIAL- CEPHALIC FISTULA CREATION RIGHT ARM;  Surgeon: Conrad [place], MD;  Location: [place];  Service: Vascular;  Laterality: Right;  . EXTRACORPOREAL SHOCK WAVE LITHOTRIPSY  03-23-2011  . FINGER SURGERY  2004  . INCISION AND DRAINAGE OF WOUND Left 09/05/2014   Procedure: IRRIGATION AND DEBRIDEMENT OF LEFT ISCHIUM WOUND WITH ;  Surgeon: Theodoro Kos, DO;  Location: [place];  Service: Plastics;  Laterality: Left;  . INSERTION OF DIALYSIS CATHETER N/A 02/27/2016   Procedure: INSERTION OF DIALYSIS CATHETER-RIGHT INTERNAL JUGULA PLACEMENT;  Surgeon: Conrad [place], MD;  Location: [place];  Service: Vascular;  Laterality: N/A;  . TONSILLECTOMY    . WOUND DEBRIDEMENT     10/2/17[facility]  Family History  Problem Relation Age of Onset  . Stroke Father   . Diabetes Father   . Diabetes Paternal Grandmother   . Stroke Paternal Grandfather   . Heart disease Neg Hx   . Cancer Neg Hx       reports that he quit smoking about 33 years ago. His smoking use included Cigars. He has never used smokeless tobacco. He reports that he drinks about 1.8 oz of alcohol per week . He reports that he does not use drugs.   Allergies  Allergen Reactions  . No Known Allergies     Prior to Admission medications   Medication Sig Start Date End Date Taking? Authorizing Provider  acetaminophen  (TYLENOL) 325 MG tablet Take 650 mg by mouth every 6 (six) hours as needed.    Historical Provider, MD  Amino Acids-Protein Hydrolys (FEEDING SUPPLEMENT, PRO-STAT SUGAR FREE 64,) LIQD Take 19ml by mouth twice daily for wound healing    Historical Provider, MD  aspirin 81 MG tablet Take 1 tablet (81 mg total) by mouth daily. 03/30/11   Rolan Bucco, MD  b complex-vitamin c-folic acid (NEPHRO-VITE) 0.8 MG TABS tablet Take 1 tablet by mouth at bedtime.    Historical Provider, MD  baclofen (LIORESAL) 10 MG tablet Take 10 mg by mouth at bedtime as needed for muscle spasms.    Historical Provider, MD  carvedilol (COREG) 3.125 MG tablet Take 3.125 mg by mouth 2 (two) times daily with a meal.    Historical Provider, MD  famotidine (PEPCID) 20 MG tablet Take 20 mg by mouth 2 (two) times daily.    Historical Provider, MD  ferric citrate (AURYXIA) 1 GM 210 MG(Fe) tablet Take 420 mg by mouth daily before breakfast.    Historical Provider, MD  ferrous sulfate 325 (65 FE) MG tablet Take 325 mg by mouth 2 (two) times daily with a meal.    Historical Provider, MD  furosemide (LASIX) 40 MG tablet Take 40 mg by mouth 2 (two) times daily.    Historical Provider, MD  heparin 5000 UNIT/ML injection Inject 5,000 Units into the skin every 8 (eight) hours.    Historical Provider, MD  insulin glargine (LANTUS) 100 UNIT/ML injection Inject 12 Units into the skin at bedtime.    Historical Provider, MD  Melatonin 3 MG TABS Take 3 mg by mouth at bedtime.    Historical Provider, MD  NON FORMULARY Drink 1 can of Nephro Steady twice daily.    Historical Provider, MD  Nutritional Supplements (NEPRO) LIQD One can by mouth twice daily for supplement/wound healing    Historical Provider, MD  oxybutynin (DITROPAN) 5 MG tablet Take 5 mg by mouth 2 (two) times daily.     Historical Provider, MD  polyethylene glycol (MIRALAX / GLYCOLAX) packet Take 17 g by mouth daily as needed for mild constipation. 02/27/16   Nita Sells, MD   sertraline (ZOLOFT) 50 MG tablet Take 50 mg by mouth daily.  04/23/16   Hendricks Limes, MD  traMADol (ULTRAM) 50 MG tablet Take 50 mg by mouth every 6 (six) hours as needed.     Historical Provider, MD     Continuous: . vancomycin 1,750 mg (05/09/16 1246)    Results for orders placed or performed during the hospital encounter of 05/09/16 (from the past 48 hour(s))  I-Stat CG4 Lactic Acid, ED     Status: None   Collection Time: 05/09/16 12:16 PM  Result Value Ref Range   Lactic Acid, Venous 1.46  0.5 - 1.9 mmol/L  I-Stat arterial blood gas, ED     Status: Abnormal   Collection Time: 05/09/16 12:34 PM  Result Value Ref Range   pH, Arterial 7.459 (H) 7.350 - 7.450   pCO2 arterial 39.2 32.0 - 48.0 mmHg   pO2, Arterial 67.0 (L) 83.0 - 108.0 mmHg   Bicarbonate 27.6 20.0 - 28.0 mmol/L   TCO2 29 0 - 100 mmol/L   O2 Saturation 93.0 %   Acid-Base Excess 4.0 (H) 0.0 - 2.0 mmol/L   Patient temperature 100.2 F    Collection site RADIAL, ALLEN'S TEST ACCEPTABLE    Drawn by RT    Sample type ARTERIAL     ROS: Ho as above  Pt notable to converse much with Nonrebreathing O2 mask   Physical Exam: Vitals:   05/09/16 1206 05/09/16 1215  BP:  119/64  Pulse:  94  Resp:  26  Temp: 100.2 F (37.9 C)      General: alert WM Thin Chronically ill , NAD, HEENT:  [place] , MM dry , Non icteric  Neck: supple, pos jvd Heart: RRR no rub, mur ,or gallop Lungs: Decreased bs throughout  Abdomen: BS pos. Soft NT, ND, Foley in place  Extremities: Bilat ft in large protective soft  boots trace pedal edema/R foot contracted  Skin: dry, warm to touch  , no overt rash  Neuro:  Alert /  Weak abut Moves all extrem to requests  Except right lower etrem  And decr left lower extrem ROM Dialysis Access: R IJ Perm cath / R FA VAF pos bruit / slow to develop  Dialysis Orders: Center: [place]   on MWF . EDW 3 30 min  HD Bath 2k, 2ca  Time 3hr 1min Heparin 2300. Access RFA AVF(03/24/16 insert), RIJ PC    MIrcera 100q  2wks (given 05/07/15)   Units IV/HD  Venofer 50mg  q weekly  hd  Other op labs hgb 9.5 ca 11.7 04/10/16 , phos 3.5  pth 35   Assessment/Plan  1. Hypoxia sec MIssed hd and PNA - hd today  / PNA rx per admit  2. ESRD -  HD Nl schedule MWF - will need time inc to 4 hours I think 3. Hypertension/volume  - bp ok / attempt uf on hd today sec missed yest and ^ vol  On cxr (BEDWTS  VERY ?? In [place]). Lower EDW 4. Anemia  - hgb 9.4 next esa  Due 05/20/16  fu hgb trend  venofer q weekly  5. Metabolic bone disease -  No vit d / Lorin Picket as binder pre brk as op / fu ca/phos trend Use Velphoro in hosp. 6. HO DM = per admit  7. HO MS / QUAD = at snh 8. Ho CM- uf on hd today  9. Polypharmacy- DC Baclofen with ho esrd  Ernest Haber, PA-C [place] [phone] 05/09/2016, 12:38 PM   Patient seen and examined, agree with above note with above modifications. Fairly new to ESRD chronically ill WM- now presenting with hypoxia- dec MS thought to have PNA, volume overload and possible medication induced sedation.  HD today and challenge, look to have lower EDW as OP- HD again Monday  Corliss Parish, MD 05/09/2016

## 2016-05-09 NOTE — Progress Notes (Signed)
Pharmacy Antibiotic Note  Lance [name] is a 76 y.o. male with ESRD admitted on 05/09/2016 with SOB and cough.  Pharmacy has been consulted for vancomycin and cefepime dosing for PNA. Patient missed last HD session due to sickness. Received cefepime 2 g IV in ED at 12:19.  Plan: Vancomycin 1750 mg IV load then 750 mg after each HD session Cefepime 2 g IV after each HD session  Height: 5\' 10"  (177.8 cm) Weight: 170 lb (77.1 kg) IBW/kg (Calculated) : 73  Temp (24hrs), Avg:100.2 F (37.9 C), Min:100.2 F (37.9 C), Max:100.2 F (37.9 C)   Recent Labs Lab 05/09/16 1216  LATICACIDVEN 1.46    Estimated Creatinine Clearance: 11 mL/min (by C-G formula based on SCr of 6 mg/dL (A)).    Allergies  Allergen Reactions  . No Known Allergies     Antimicrobials this admission: Vancomycin 1/6 >>  Cefepime 1/6 >>   Dose adjustments this admission:   Microbiology results:   Thank you for allowing pharmacy to be a part of this patient's care.  Renold Genta, PharmD, BCPS Clinical Pharmacist Phone for today - [facility] - [phone] 05/09/2016 12:31 PM

## 2016-05-09 NOTE — ED Triage Notes (Signed)
Pt in from [place] via [facility] with sob and cough x 2 days. Per EMS, pt was altered yesterday with fever of 100.56F, missed Dialysis session also. When EMS arrived, pt was 77% on RA, they placed on NRB and sats came up to 94%. Pt is a&ox4, hx of MS, HTN.

## 2016-05-09 NOTE — ED Provider Notes (Signed)
[facility] DEPT Provider Note   CSN: [id number] Arrival date & time: 05/09/16 1114     History    Chief Complaint  Patient presents with  . Shortness of Breath  . Cough     HPI Lance [name] is a 76 y.o. male.  76yo M w/ extensive PMH including CHF, ESRD on HD, MS, HTN who p/w shortness of breath and cough. Patient reports 2 days of cough and progressively worsening shortness of breath. His symptoms became much worse this morning which is why he called EMS. He notes a fever to 100.4 yesterday evening . Confusion. He missed his routine dialysis session yesterday because of illness. He had nausea, vomiting, and diarrhea 3 days ago. The vomiting has improved but he continues to have mild diarrhea. He denies any pain currently, specifically no chest pain. He has blood in his Foley catheter bag and is unsure when this began. EMS noted that the patient was 77% on room air which improved to 94% on NRB.    Past Medical History:  Diagnosis Date  . Acute CHF (congestive heart failure) ([place]) 02/14/2016  . Acute osteomyelitis, pelvis, right ([place])   . Acute renal failure ([place])   . Anemia in chronic renal disease   . Blood transfusion   . Cardiomyopathy ([place]) 02/17/2016  . Chronic spastic paraplegia (HCC)    secondary to MS  . CKD (chronic kidney disease), stage III   . Decreased sensation of lower extremity    due to MS  . Decubitus ulcer of ischium   . Decubitus ulcer of trochanter, stage 4 ([place]) 06/14/2015  . History of MRSA infection    urine  . History of recurrent UTIs   . Hypertension   . MS (multiple sclerosis) ([place])   . Neuralgia neuritis, sciatic nerve    MS for 30 years  . Neurogenic bladder   . PONV (postoperative nausea and vomiting)   . Self-catheterizes urinary bladder   . Type 2 diabetes mellitus [facility])      Patient Active Problem List   Diagnosis Date Noted  . Altered mental status 04/21/2016  . Acute respiratory failure ([place])   . Acute renal failure  with acute tubular necrosis superimposed on stage 3 chronic kidney disease ([place])   . NICM (nonischemic cardiomyopathy) ([place])   . Impaired functional mobility, balance, and endurance 02/17/2016  . Generalized anxiety disorder 02/17/2016  . Cardiomyopathy ([place]) 02/17/2016  . Hypoxia   . Acute pulmonary edema (HCC)   . Elevated troponin 02/14/2016  . Severe protein-calorie malnutrition ([place]) 02/14/2016  . Acute systolic heart failure ([place]) 02/14/2016  . Depression 02/13/2016  . AKI (acute kidney injury) ([place]) 01/30/2016  . DM (diabetes mellitus) type II controlled with renal manifestation ([place]) 09/06/2015  . Dyslipidemia associated with type 2 diabetes mellitus ([place]) 09/06/2015  . Recurrent UTI (urinary tract infection) 09/06/2015  . Decubitus ulcer of right ischial area 06/14/2015  . Decubitus ulcer of trochanter, stage 4 ([place]) 06/14/2015  . Left perineal ischial pressure ulcer   . Acute osteomyelitis, pelvis, right ([place])   . Urinary tract infection   . Chronic kidney disease, stage 3 08/03/2013  . Essential hypertension, benign 08/03/2013  . Cellulitis of foot 08/02/2013  . Spastic quadriplegia ([place]) 07/10/2011  . Multiple sclerosis ([place]) 03/30/2011  . Sacral decubitus ulcer 03/30/2011  . Normocytic anemia 03/30/2011    Past Surgical History:  Procedure Laterality Date  . APPLICATION OF A-CELL OF EXTREMITY Left 09/05/2014   Procedure: PLACEMENT OF ACELL AND  VAC;  Surgeon: Theodoro Kos, DO;  Location: [facility];  Service: Plastics;  Laterality: Left;  . BASCILIC VEIN TRANSPOSITION Right 03/24/2016   Procedure: RADIAL- CEPHALIC FISTULA CREATION RIGHT ARM;  Surgeon: Conrad [place], MD;  Location: [facility];  Service: Vascular;  Laterality: Right;  . EXTRACORPOREAL SHOCK WAVE LITHOTRIPSY  03-23-2011  . FINGER SURGERY  2004  . INCISION AND DRAINAGE OF WOUND Left 09/05/2014   Procedure: IRRIGATION AND DEBRIDEMENT OF LEFT ISCHIUM WOUND WITH ;  Surgeon: Theodoro Kos, DO;  Location: [facility];  Service:  Plastics;  Laterality: Left;  . INSERTION OF DIALYSIS CATHETER N/A 02/27/2016   Procedure: INSERTION OF DIALYSIS CATHETER-RIGHT INTERNAL JUGULA PLACEMENT;  Surgeon: Conrad Dooly, MD;  Location: [facility];  Service: Vascular;  Laterality: N/A;  . TONSILLECTOMY    . WOUND DEBRIDEMENT     [facility]2/17[facility]         Home Medications    Prior to Admission medications   Medication Sig Start Date End Date Taking? Authorizing Provider  acetaminophen (TYLENOL) 325 MG tablet Take 650 mg by mouth every 6 (six) hours as needed.    Historical Provider, MD  Amino Acids-Protein Hydrolys (FEEDING SUPPLEMENT, PRO-STAT SUGAR FREE 64,) LIQD Take 76ml by mouth twice daily for wound healing    Historical Provider, MD  aspirin 81 MG tablet Take 1 tablet (81 mg total) by mouth daily. 03/30/11   Rolan Bucco, MD  b complex-vitamin c-folic acid (NEPHRO-VITE) 0.8 MG TABS tablet Take 1 tablet by mouth at bedtime.    Historical Provider, MD  baclofen (LIORESAL) 10 MG tablet Take 10 mg by mouth at bedtime as needed for muscle spasms.    Historical Provider, MD  carvedilol (COREG) 3.125 MG tablet Take 3.125 mg by mouth 2 (two) times daily with a meal.    Historical Provider, MD  famotidine (PEPCID) 20 MG tablet Take 20 mg by mouth 2 (two) times daily.    Historical Provider, MD  ferric citrate (AURYXIA) 1 GM 210 MG(Fe) tablet Take 420 mg by mouth daily before breakfast.    Historical Provider, MD  ferrous sulfate 325 (65 FE) MG tablet Take 325 mg by mouth 2 (two) times daily with a meal.    Historical Provider, MD  furosemide (LASIX) 40 MG tablet Take 40 mg by mouth 2 (two) times daily.    Historical Provider, MD  heparin 5000 UNIT/ML injection Inject 5,000 Units into the skin every 8 (eight) hours.    Historical Provider, MD  insulin glargine (LANTUS) 100 UNIT/ML injection Inject 12 Units into the skin at bedtime.    Historical Provider, MD  Melatonin 3 MG TABS Take 3 mg by mouth at bedtime.    Historical Provider, MD  NON  FORMULARY Drink 1 can of Nephro Steady twice daily.    Historical Provider, MD  Nutritional Supplements (NEPRO) LIQD One can by mouth twice daily for supplement/wound healing    Historical Provider, MD  oxybutynin (DITROPAN) 5 MG tablet Take 5 mg by mouth 2 (two) times daily.     Historical Provider, MD  polyethylene glycol (MIRALAX / GLYCOLAX) packet Take 17 g by mouth daily as needed for mild constipation. 02/27/16   Nita Sells, MD  sertraline (ZOLOFT) 50 MG tablet Take 50 mg by mouth daily.  04/23/16   Hendricks Limes, MD  traMADol (ULTRAM) 50 MG tablet Take 50 mg by mouth every 6 (six) hours as needed.     Historical Provider, MD      [facility]  History  Problem Relation Age of Onset  . Stroke Father   . Diabetes Father   . Diabetes Paternal Grandmother   . Stroke Paternal Grandfather   . Heart disease Neg Hx   . Cancer Neg Hx      Social History  Substance Use Topics  . Smoking status: Former Smoker    Types: Cigars    Quit date: 05/06/1983  . Smokeless tobacco: Never Used  . Alcohol use 1.8 oz/week    3 Glasses of wine per week     Comment: 3 glasses of wine per week     Allergies     No known allergies    Review of Systems  10 Systems reviewed and are negative for acute change except as noted in the HPI.   Physical Exam Updated Vital Signs Ht 5\' 10"  (1.778 m)   Wt 170 lb (77.1 kg)   SpO2 (!) 88%   BMI 24.39 kg/m   Physical Exam  Constitutional: He is oriented to person, place, and time. No distress.  Thin, frail, chronically ill appearing elderly man awake and alert  HENT:  Head: Normocephalic and atraumatic.  Dry mucous membranes  Eyes: Conjunctivae are normal. Pupils are equal, round, and reactive to light.  Neck: Neck supple.  Cardiovascular: Normal rate, regular rhythm and normal heart sounds.   No murmur heard. Pulmonary/Chest:  Mild tachypnea, diminished BS b/l, no wheezing  Abdominal: Soft. Bowel sounds are normal. He exhibits no  distension. There is tenderness. There is no rebound and no guarding.  Mild suprapubic tenderness  Genitourinary:  Genitourinary Comments: Foley catheter in place draining bloody urine  Musculoskeletal: He exhibits no edema.  Neurological: He is alert and oriented to person, place, and time.  Fluent speech  Skin: Skin is warm and dry.  vascath in R chest w/ no surrounding erythema or drainage  Psychiatric: He has a normal mood and affect.  Nursing note and vitals reviewed.     ED Treatments / Results  Labs (all labs ordered are listed, but only abnormal results are displayed) Labs Reviewed  CULTURE, BLOOD (ROUTINE X 2)  CULTURE, BLOOD (ROUTINE X 2)  URINE CULTURE  COMPREHENSIVE METABOLIC PANEL  CBC WITH DIFFERENTIAL/PLATELET  URINALYSIS, ROUTINE W REFLEX MICROSCOPIC  BRAIN NATRIURETIC PEPTIDE  BLOOD GAS, ARTERIAL  I-STAT CG4 LACTIC ACID, ED     EKG  EKG Interpretation  Date/Time:  Saturday May 09 2016 11:23:25 EST Ventricular Rate:  94 PR Interval:    QRS Duration: 101 QT Interval:  363 QTC Calculation: 454 R Axis:   78 Text Interpretation:  Sinus rhythm Atrial premature complex No significant change since last tracing Confirmed by LITTLE MD, RACHEL [phone]) on 05/09/2016 11:40:37 AM         Radiology Dg Chest Port 1 View  Result Date: 05/09/2016 CLINICAL DATA:  76 year old male with shortness of breath and cough for the past 2 days EXAM: PORTABLE CHEST 1 VIEW COMPARISON:  Prior chest x-ray 02/27/2016 FINDINGS: Stable position of right IJ approach tunneled hemodialysis catheter. The catheter tips overlie the superior cavoatrial junction. Stable borderline cardiomegaly. Slightly increased pulmonary vascular congestion likely reflecting mild interstitial edema. Nonspecific left lower lobe airspace opacity obscures the diaphragm. IMPRESSION: 1. Nonspecific left basilar airspace opacity, likely within the left lower lobe. Differential considerations include pleural  fluid with atelectasis and/or infiltrate. Consider PA and lateral chest x-ray for further differentiation. 2. Slightly increased pulmonary vascular congestion now with mild interstitial edema. 3. Stable cardiomegaly. 4. Stable right  IJ approach tunneled hemodialysis catheter. Electronically Signed   By: Jacqulynn Cadet M.D.   On: 05/09/2016 11:48    Procedures Procedures (including critical care time) .Critical Care Performed by: Sharlett Iles Authorized by: Sharlett Iles   Critical care provider statement:    Critical care time (minutes):  45   Critical care time was exclusive of:  Separately billable procedures and treating other patients   Critical care was necessary to treat or prevent imminent or life-threatening deterioration of the following conditions:  Respiratory failure   Critical care was time spent personally by me on the following activities:  Development of treatment plan with patient or surrogate, evaluation of patient's response to treatment, examination of patient, obtaining history from patient or surrogate, ordering and performing treatments and interventions, ordering and review of laboratory studies, ordering and review of radiographic studies, re-evaluation of patient's condition and review of old charts    Medications Ordered in ED  Medications - No data to display   Initial Impression / Assessment and Plan / ED Course  I have reviewed the triage vital signs and the nursing notes.  Pertinent labs & imaging results that were available during my care of the patient were reviewed by me and considered in my medical decision making (see chart for details).  Clinical Course     PT w/ 2d progressively worsening shortness of breath and cough, fever last night. He was hypoxic by EMS. On my exam, he was breathing comfortably on NRB, O2 92%. No obvious wheezes on exam. Diminished breath sounds bilaterally. Patient reported to be febrile by EMS to 100.4,  therefore initiated a code sepsis with blood and urine cultures. Gave vancomycin and cefepime to treat HCAP. Chest x-ray shows left basilar opacity, pleural fluid versus infiltrate. He does have some mild interstitial edema and vascular congestion. Placed on bipap.  Labwork shows normal lactate, Hemoccult positive stool, reassuring ABG, stable hemoglobin at 9.4, normal potassium, UA with large amount of blood. The patient appears comfortable on reexamination and I am able to titrate down his FiO2 on BiPAP. I have discussed with [facility] hospitalist, [name], who will admit for further treatment. I appreciate his assistance. Patient admitted to stepdown for further care. Family updated on plan.  Final Clinical Impressions(s) / ED Diagnoses   Final diagnoses:  HCAP (healthcare-associated pneumonia)  Pulmonary edema with congestive heart failure (HCC)  Hematuria, unspecified type  Melena  Acute respiratory failure with hypoxia [facility])     New Prescriptions   No medications on file       Sharlett Iles, MD 05/09/16 1653

## 2016-05-10 ENCOUNTER — Inpatient Hospital Stay (HOSPITAL_COMMUNITY): Payer: Medicare Other

## 2016-05-10 DIAGNOSIS — F3289 Other specified depressive episodes: Secondary | ICD-10-CM

## 2016-05-10 DIAGNOSIS — K920 Hematemesis: Secondary | ICD-10-CM

## 2016-05-10 DIAGNOSIS — N186 End stage renal disease: Secondary | ICD-10-CM

## 2016-05-10 DIAGNOSIS — D638 Anemia in other chronic diseases classified elsewhere: Secondary | ICD-10-CM

## 2016-05-10 DIAGNOSIS — Z992 Dependence on renal dialysis: Secondary | ICD-10-CM

## 2016-05-10 LAB — BASIC METABOLIC PANEL
Anion gap: 14 (ref 5–15)
BUN: 18 mg/dL (ref 6–20)
CHLORIDE: 94 mmol/L — AB (ref 101–111)
CO2: 25 mmol/L (ref 22–32)
Calcium: 7.3 mg/dL — ABNORMAL LOW (ref 8.9–10.3)
Creatinine, Ser: 2.95 mg/dL — ABNORMAL HIGH (ref 0.61–1.24)
GFR calc Af Amer: 22 mL/min — ABNORMAL LOW (ref >60)
GFR calc non Af Amer: 19 mL/min — ABNORMAL LOW (ref >60)
GLUCOSE: 146 mg/dL — AB (ref 65–99)
POTASSIUM: 3.4 mmol/L — AB (ref 3.5–5.1)
Sodium: 133 mmol/L — ABNORMAL LOW (ref 135–145)

## 2016-05-10 LAB — GLUCOSE, CAPILLARY
GLUCOSE-CAPILLARY: 171 mg/dL — AB (ref 65–99)
Glucose-Capillary: 141 mg/dL — ABNORMAL HIGH (ref 65–99)
Glucose-Capillary: 153 mg/dL — ABNORMAL HIGH (ref 65–99)
Glucose-Capillary: 87 mg/dL (ref 65–99)
Glucose-Capillary: 128 mg/dL — ABNORMAL HIGH (ref 65–99)

## 2016-05-10 LAB — CBC
HCT: 28.7 % — ABNORMAL LOW (ref 39.0–52.0)
Hemoglobin: 9.2 g/dL — ABNORMAL LOW (ref 13.0–17.0)
MCH: 31.2 pg (ref 26.0–34.0)
MCHC: 32.1 g/dL (ref 30.0–36.0)
MCV: 97.3 fL (ref 78.0–100.0)
Platelets: 192 10*3/uL (ref 150–400)
RBC: 2.95 MIL/uL — ABNORMAL LOW (ref 4.22–5.81)
RDW: 16.5 % — ABNORMAL HIGH (ref 11.5–15.5)
WBC: 11.9 10*3/uL — ABNORMAL HIGH (ref 4.0–10.5)

## 2016-05-10 LAB — BRAIN NATRIURETIC PEPTIDE: B Natriuretic Peptide: 467.6 pg/mL — ABNORMAL HIGH (ref 0.0–100.0)

## 2016-05-10 MED ORDER — MUPIROCIN 2 % EX OINT
1.0000 "application " | TOPICAL_OINTMENT | Freq: Two times a day (BID) | CUTANEOUS | Status: AC
  Administered 2016-05-10 – 2016-05-14 (×10): 1 via NASAL
  Filled 2016-05-10: qty 22

## 2016-05-10 MED ORDER — CHLORHEXIDINE GLUCONATE CLOTH 2 % EX PADS
6.0000 | MEDICATED_PAD | Freq: Every day | CUTANEOUS | Status: AC
  Administered 2016-05-11 – 2016-05-14 (×4): 6 via TOPICAL

## 2016-05-10 NOTE — Progress Notes (Signed)
Patient ID: Lance [name], male   [name]: 09/09/1940, 76 y.o.   MRN: [id number]    PROGRESS NOTE    Lance [name]  VXY:[id number] [name]: [birth year] DOA: 05/09/2016  PCP: [name], MD   Brief Narrative:  76 y.o. male  with past mental history significant for congestive heart failure with last 2 D ECHO in 02/2016 with EF 35% and mild myxomatous degeneration, end-stage renal disease, recurrent UTI, MS, from [facility] SN[facility] who presented to ED with vomiting, diarrhea, shortness of breath. His O2 sat was 77%at the time EMS arrived. The physician at the SNF was working him up for urinary tract infection. He had not been given any antibiotics. Patient reported that he vomited blood.  Patient started on vancomycin and cefepime on admission. Patient placed on BiPAP due to hypoxia. Patient had a bloody melanotic stool. Patient also appeared to have blood on UA. Report given that patient was on heparin at his facility to prevent DVTs. INR was normal and initial lactic acid was normal.  Assessment & Plan:   Principal Problem: Sepsis secondary to pneumonia and UTI / Leukocytosis - Sepsis criteria met on admission with fever, tachycardia, tachypnea, hypotension, hypoxia, leukocytosis and source of infection presumed to be pneumonia versus UTI - CXR showed left basilar airspace opacity, likely within the left lower lobe. He had only small leukocytes on UA - He is on vanco and cefepime - Blood cx negative  - Urine cx is growing gram negative rods, final report pending - Monitor daily CBC  Active Problems: Hematemesis - Resolved at this point - Hemoglobin is 9.2 - Continue protonix 40 mg Q 12 hours  - Check CBC in am  Hypokalemia - Possibly from lasix - Check BMP in am  End stage renal disease on HD - HD MWF - Management per renal  Anemia of chronic disease - Due to ESRD - Hemoglobin stable   DM (diabetes mellitus) type II controlled with renal manifestation with long term  insulin use (HCC) - Continue sliding scale insulin  Depression - Continue Zoloft   Essential hypertension - Continue carvedilol 3.125 mg BID  DVT prophylaxis: SCD's bilaterally  Code Status: full code  Family Communication: spoke with his son over the phone today  Disposition Plan: not yet stable for discharge, will likely need 3-4 days of inpatient stay while awaiting urine and blood cx results    Consultants:   Nephrology   Procedures:   None  Antimicrobials:   Cefepime and vanco 05/09/2016 -->    Subjective: Feels weak and tired.  Objective: Vitals:   05/10/16 0215 05/10/16 0225 05/10/16 0918 05/10/16 1500  BP: (!) 99/55 (!) 106/57 (!) 110/46 (!) 108/32  Pulse: 86 85 81 81  Resp: (!) 22 (!) '22 16 16  ' Temp:  98.2 F (36.8 C)  98.3 F (36.8 C)  TempSrc:  Oral  Oral  SpO2:   93% 100%  Weight:      Height:        Intake/Output Summary (Last 24 hours) at 05/10/16 1513 Last data filed at 05/10/16 0500  Gross per 24 hour  Intake              125 ml  Output              900 ml  Net             -775 ml   Filed Weights   05/09/16 1130 05/09/16 2245  Weight: 77.1 kg (170 lb) 74.5  kg (164 lb 3.9 oz)    Examination:  General exam: Appears weak, ill Respiratory system: Clear to auscultation. Respiratory effort normal. Cardiovascular system: S1 & S2 heard, rate controlled  Gastrointestinal system: Abdomen is nondistended, soft and nontender. No organomegaly or masses felt. Normal bowel sounds heard. Central nervous system: Alert and oriented. No focal neurological deficits. Extremities: Symmetric 5 x 5 power. Skin: No rashes, lesions or ulcers Psychiatry: Judgement and insight appear normal. Mood & affect appropriate.   Data Reviewed: I have personally reviewed following labs and imaging studies  CBC:  Recent Labs Lab 05/09/16 1151 05/09/16 2017 05/10/16 0410  WBC 9.5  --  11.9*  NEUTROABS 7.7  --   --   HGB 9.4* 10.0* 9.2*  HCT 29.8* 31.3* 28.7*    MCV 97.7  --  97.3  PLT 199  --  696   Basic Metabolic Panel:  Recent Labs Lab 05/09/16 1151 05/10/16 0410  NA 135 133*  K 3.9 3.4*  CL 93* 94*  CO2 26 25  GLUCOSE 117* 146*  BUN 62* 18  CREATININE 6.87* 2.95*  CALCIUM 8.3* 7.3*   GFR: Estimated Creatinine Clearance: 22.3 mL/min (by C-G formula based on SCr of 2.95 mg/dL (H)). Liver Function Tests:  Recent Labs Lab 05/09/16 1151  AST 16  ALT 13*  ALKPHOS 67  BILITOT 0.8  PROT 7.3  ALBUMIN 2.1*   No results for input(s): LIPASE, AMYLASE in the last 168 hours. No results for input(s): AMMONIA in the last 168 hours. Coagulation Profile:  Recent Labs Lab 05/09/16 1509  INR 1.10   Cardiac Enzymes: No results for input(s): CKTOTAL, CKMB, CKMBINDEX, TROPONINI in the last 168 hours. BNP (last 3 results) No results for input(s): PROBNP in the last 8760 hours. HbA1C: No results for input(s): HGBA1C in the last 72 hours. CBG:  Recent Labs Lab 05/09/16 2027 05/10/16 0341 05/10/16 0750 05/10/16 1129  GLUCAP 101* 141* 153* 171*   Lipid Profile: No results for input(s): CHOL, HDL, LDLCALC, TRIG, CHOLHDL, LDLDIRECT in the last 72 hours. Thyroid Function Tests: No results for input(s): TSH, T4TOTAL, FREET4, T3FREE, THYROIDAB in the last 72 hours. Anemia Panel: No results for input(s): VITAMINB12, FOLATE, FERRITIN, TIBC, IRON, RETICCTPCT in the last 72 hours. Urine analysis:    Component Value Date/Time   COLORURINE AMBER (A) 05/09/2016 1220   APPEARANCEUR CLOUDY (A) 05/09/2016 1220   LABSPEC 1.012 05/09/2016 1220   PHURINE 8.0 05/09/2016 1220   GLUCOSEU NEGATIVE 05/09/2016 1220   HGBUR LARGE (A) 05/09/2016 1220   BILIRUBINUR NEGATIVE 05/09/2016 1220   KETONESUR NEGATIVE 05/09/2016 1220   PROTEINUR 100 (A) 05/09/2016 1220   UROBILINOGEN 0.2 08/30/2014 2203   NITRITE NEGATIVE 05/09/2016 1220   LEUKOCYTESUR SMALL (A) 05/09/2016 1220   Sepsis Labs: '@LABRCNTIP' (procalcitonin:4,lacticidven:4)   Urine  culture     Status: Abnormal (Preliminary result)   Collection Time: 05/09/16 12:20 PM  Result Value Ref Range Status   Specimen Description URINE, CLEAN CATCH  Final   Special Requests Normal  Final   Culture >=100,000 COLONIES/mL GRAM NEGATIVE RODS (A)  Final   Report Status PENDING  Incomplete  MRSA PCR Screening     Status: Abnormal   Collection Time: 05/09/16  8:06 PM  Result Value Ref Range Status   MRSA by PCR POSITIVE (A) NEGATIVE Final      Radiology Studies: US Renal Result Date: 05/09/2016  Increased renal echogenicity bilaterally is indicative of medical renal disease. No hydronephrosis seen on either side. There is  a complex cystic mass arising from the upper pole right kidney. There is an area of apparent nodularity within this cyst, raising concern for potential cystic neoplasm. Further evaluation with pre and post contrast MRI or CT nonemergently should be considered. MRI is preferred in younger patients (due to lack of ionizing radiation) and for evaluating calcified lesion(s). Electronically Signed   By: Lowella Grip III M.D.   On: 05/09/2016 21:53   Dg Chest Port 1 View Result Date: 05/10/2016 Stable left basilar changes.   Dg Chest Port 1 View Result Date: 05/09/2016 1. Nonspecific left basilar airspace opacity, likely within the left lower lobe. Differential considerations include pleural fluid with atelectasis and/or infiltrate. Consider PA and lateral chest x-ray for further differentiation. 2. Slightly increased pulmonary vascular congestion now with mild interstitial edema. 3. Stable cardiomegaly. 4. Stable right IJ approach tunneled hemodialysis catheter.    Scheduled Meds: . aspirin  81 mg Oral Daily  . carvedilol  3.125 mg Oral BID WC  . ceFEPime (MAXIPIME) IV  1 g Intravenous Q24H  . [START ON 05/11/2016] Chlorhexidine Gluconate Cloth  6 each Topical Q0600  . [START ON 05/13/2016] ferric gluconate (FERRLECIT/NULECIT) IV  62.5 mg Intravenous Q Wed-HD  .  insulin aspart  0-15 Units Subcutaneous Q4H  . Melatonin  3 mg Oral QHS  . mupirocin ointment  1 application Nasal BID  . oxybutynin  5 mg Oral BID  . pantoprazole (PROTONIX) IV  40 mg Intravenous Q12H  . saccharomyces boulardii  250 mg Oral BID  . sertraline  50 mg Oral Daily   Continuous Infusions:   LOS: 1 day    Time spent: 25 minutes  Greater than 50% of the time spent on counseling and coordinating the care.   Leisa Lenz, MD [facility] Pager [phone]  If 7PM-7AM, please contact night-coverage [email or web address] Password [email or web address] 05/10/2016, 3:13 PM

## 2016-05-10 NOTE — Progress Notes (Signed)
Pt has 2 pressure ulcer that were present on admission.  One is about size of silver dollar on Right buttock (with tunneling; stage 3/4) and second is about size of a nickel on left hip (stage 2/3).  Wet to dry dressings were applied by RNs.  Pt states the staff at SNF do a daily wet to dry dsg on them.  Small amount serosangineous drainage visible.  WOC ordered by MD upon admission.  Pt repositioned on his side.

## 2016-05-10 NOTE — Progress Notes (Signed)
Subjective:  Feels better after HD last pm-am /ate  some brk - only 900 removed with HD - limited by hypotension  Objective Vital signs in last 24 hours: Vitals:   05/10/16 0145 05/10/16 0215 05/10/16 0225 05/10/16 0918  BP: (!) 94/47 (!) 99/55 (!) 106/57 (!) 110/46  Pulse: 91 86 85 81  Resp: (!) 22 (!) 22 (!) 22 16  Temp:   98.2 F (36.8 C)   TempSrc:   Oral   SpO2:    93%  Weight:      Height:       Weight change:   Physical Exam: General: alert WM Thin Chronically ill , NAD, Heart: RRR no rub, mur ,or gallop Lungs: Decreased bs throughout  Abdomen: BS pos. Soft NT, ND, Foley in place  Extremities: Bilat ft in large protective soft  boots trace pedal edema/R foot contracted  Dialysis Access: R IJ Perm cath / R FA VAF pos bruit / slow to develop   OP Dialysis Orders: Center: [facility]   on MWF . EDW= 76 kg  (but leaving at 75.5,75.4 last 2 txs) 3 30 min  HD Bath 2k, 2ca  Time 3hr 81min Heparin 2300. Access RFA AVF(03/24/16 insert), RIJ PC    MIrcera 100q 2wks (given 05/07/15)   Units IV/HD  Venofer 50mg  q weekly  hd  Other op labs hgb 9.5 ca 11.7 04/10/16 , phos 3.5  pth 35   Assessment/Plan  1. Hypoxia sec MIssed hd and PNA - Had HD late last night  With 900 cc uf and now off Re breather Mask and on 4 l [place]  O 2 sat improved  . / 2. SIRS 2/2 pna/uti-  rx per admit / has chronic indwelling foley sec Neurogenic bladder  3. ESRD -  K=3.4 use 4 k bath for am hd  Reck labs pre hd/ HD Nl schedule MWF Next HD  Tomor. DW pt need to increase time to 4hrs.with ho cm = He agrees  / has AVF RFA Slow to develop=has  Op .[facility] f/u 1/24 Dr. Bridgett Larsson  4. Hypertension/volume  - bp ok / attempt uf on hd tomor. Old edw 2 (was leaving below 75.5-75.4   As op ) attempt 2-2.5 l uf tomor  May need Midodrine  5. Reported AMS= Many factors= Polypharmacy=DC Baclofen with ho esrd/  infec / pna  With hypoxia - back to baseline  This am  6. Anemia  - hgb 9.2 next esa  Due 05/20/16  fu hgb trend  venofer q  weekly 7.  Melena- wu per admit , use no hep hd for now 8. Sacral decub/ HO  R Ischial Pressure ulcer- RX  per Admit team ( has scheduled FU with , Plastic Surgery f/u 1/23 -- Dr Pascal Lux.  At [facility]  ) 9. Metabolic bone disease -  No vit d / Lorin Picket as binder pre brk as op / fu ca/phos trend Use Velphoro in hosp. 10. R renal mass/ complex cyst on Renal US - wu per admit " Concern for potential Neoplasm "? I would not pursue any management for this given his poor condition 11. HO DM = per admit  12. HO MS / QUAD = at [place], PA-C [place] [phone] 05/10/2016,10:29 AM  LOS: 1 day    Patient seen and examined, agree with above note with above modifications. O2 requirement is less and he looks a little perkier today- no plans today- regular HD  in AM Corliss Parish, MD 05/10/2016     Labs: Basic Metabolic Panel:  Recent Labs Lab 05/09/16 1151 05/10/16 0410  NA 135 133*  K 3.9 3.4*  CL 93* 94*  CO2 26 25  GLUCOSE 117* 146*  BUN 62* 18  CREATININE 6.87* 2.95*  CALCIUM 8.3* 7.3*   Liver Function Tests:  Recent Labs Lab 05/09/16 1151  AST 16  ALT 13*  ALKPHOS 67  BILITOT 0.8  PROT 7.3  ALBUMIN 2.1*   No results for input(s): LIPASE, AMYLASE in the last 168 hours. No results for input(s): AMMONIA in the last 168 hours. CBC:  Recent Labs Lab 05/09/16 1151 05/09/16 2017 05/10/16 0410  WBC 9.5  --  11.9*  NEUTROABS 7.7  --   --   HGB 9.4* 10.0* 9.2*  HCT 29.8* 31.3* 28.7*  MCV 97.7  --  97.3  PLT 199  --  192   Cardiac Enzymes: No results for input(s): CKTOTAL, CKMB, CKMBINDEX, TROPONINI in the last 168 hours. CBG:  Recent Labs Lab 05/09/16 2027 05/10/16 0341 05/10/16 0750  GLUCAP 101* 141* 153*    Studies/Results: US Renal  Result Date: 05/09/2016 CLINICAL DATA:  Hematuria.  End-stage renal disease EXAM: RENAL ULTRASOUND COMPARISON:  February 14, 2016. FINDINGS: Right Kidney: Length: 11.9 cm. Echogenicity is  increased. Renal cortical thickness is within normal limits. No perinephric fluid or hydronephrosis visualized. There is a somewhat complex cystic mass arising from the upper pole of the right kidney measuring 2.5 x 2.3 x 1.9 cm. No other renal mass evident. No sonographically demonstrable calculus or ureterectasis. Left Kidney: Length: 12.7 cm. Echogenicity is increased. Renal cortical thickness is normal. No mass, perinephric fluid, or hydronephrosis visualized. No sonographically demonstrable calculus or ureterectasis. Bladder: Urinary bladder is decompressed by Foley catheter and cannot be assessed. IMPRESSION: Increased renal echogenicity bilaterally is indicative of medical renal disease. No hydronephrosis seen on either side. There is a complex cystic mass arising from the upper pole right kidney. There is an area of apparent nodularity within this cyst, raising concern for potential cystic neoplasm. Further evaluation with pre and post contrast MRI or CT nonemergently should be considered. MRI is preferred in younger patients (due to lack of ionizing radiation) and for evaluating calcified lesion(s). Electronically Signed   By: Lowella Grip III M.D.   On: 05/09/2016 21:53   Dg Chest Port 1 View  Result Date: 05/10/2016 CLINICAL DATA:  Hypoxia EXAM: PORTABLE CHEST 1 VIEW COMPARISON:  05/09/2016 FINDINGS: Cardiac shadow is mildly enlarged. Dialysis catheter is again seen. Left basilar opacities are again identified and stable. Mild interstitial changes are noted bilaterally. No new focal abnormality is seen. IMPRESSION: Stable left basilar changes. Electronically Signed   By: Inez Catalina M.D.   On: 05/10/2016 08:34   Dg Chest Port 1 View  Result Date: 05/09/2016 CLINICAL DATA:  76 year old male with shortness of breath and cough for the past 2 days EXAM: PORTABLE CHEST 1 VIEW COMPARISON:  Prior chest x-ray 02/27/2016 FINDINGS: Stable position of right IJ approach tunneled hemodialysis catheter. The  catheter tips overlie the superior cavoatrial junction. Stable borderline cardiomegaly. Slightly increased pulmonary vascular congestion likely reflecting mild interstitial edema. Nonspecific left lower lobe airspace opacity obscures the diaphragm. IMPRESSION: 1. Nonspecific left basilar airspace opacity, likely within the left lower lobe. Differential considerations include pleural fluid with atelectasis and/or infiltrate. Consider PA and lateral chest x-ray for further differentiation. 2. Slightly increased pulmonary vascular congestion now with mild interstitial edema. 3. Stable cardiomegaly.  4. Stable right IJ approach tunneled hemodialysis catheter. Electronically Signed   By: Jacqulynn Cadet M.D.   On: 05/09/2016 11:48   Medications:  . aspirin  81 mg Oral Daily  . carvedilol  3.125 mg Oral BID WC  . ceFEPime (MAXIPIME) IV  1 g Intravenous Q24H  . [START ON 05/13/2016] ferric gluconate (FERRLECIT/NULECIT) IV  62.5 mg Intravenous Q Wed-HD  . furosemide  40 mg Oral BID  . insulin aspart  0-15 Units Subcutaneous Q4H  . Melatonin  3 mg Oral QHS  . oxybutynin  5 mg Oral BID  . pantoprazole (PROTONIX) IV  40 mg Intravenous Q12H  . saccharomyces boulardii  250 mg Oral BID  . sertraline  50 mg Oral Daily

## 2016-05-11 DIAGNOSIS — A419 Sepsis, unspecified organism: Principal | ICD-10-CM

## 2016-05-11 DIAGNOSIS — B961 Klebsiella pneumoniae [K. pneumoniae] as the cause of diseases classified elsewhere: Secondary | ICD-10-CM

## 2016-05-11 DIAGNOSIS — N390 Urinary tract infection, site not specified: Secondary | ICD-10-CM

## 2016-05-11 LAB — CBC
HEMATOCRIT: 25.9 % — AB (ref 39.0–52.0)
HEMOGLOBIN: 8.2 g/dL — AB (ref 13.0–17.0)
MCH: 30.7 pg (ref 26.0–34.0)
MCHC: 31.7 g/dL (ref 30.0–36.0)
MCV: 97 fL (ref 78.0–100.0)
Platelets: 221 10*3/uL (ref 150–400)
RBC: 2.67 MIL/uL — AB (ref 4.22–5.81)
RDW: 16.4 % — ABNORMAL HIGH (ref 11.5–15.5)
WBC: 14 10*3/uL — AB (ref 4.0–10.5)

## 2016-05-11 LAB — BASIC METABOLIC PANEL
ANION GAP: 12 (ref 5–15)
BUN: 41 mg/dL — ABNORMAL HIGH (ref 6–20)
CHLORIDE: 95 mmol/L — AB (ref 101–111)
CO2: 28 mmol/L (ref 22–32)
CREATININE: 4.62 mg/dL — AB (ref 0.61–1.24)
Calcium: 8.1 mg/dL — ABNORMAL LOW (ref 8.9–10.3)
GFR calc non Af Amer: 11 mL/min — ABNORMAL LOW (ref >60)
GFR, EST AFRICAN AMERICAN: 13 mL/min — AB (ref >60)
Glucose, Bld: 130 mg/dL — ABNORMAL HIGH (ref 65–99)
Potassium: 3.1 mmol/L — ABNORMAL LOW (ref 3.5–5.1)
Sodium: 135 mmol/L (ref 135–145)

## 2016-05-11 LAB — GLUCOSE, CAPILLARY
GLUCOSE-CAPILLARY: 151 mg/dL — AB (ref 65–99)
Glucose-Capillary: 116 mg/dL — ABNORMAL HIGH (ref 65–99)
Glucose-Capillary: 117 mg/dL — ABNORMAL HIGH (ref 65–99)
Glucose-Capillary: 120 mg/dL — ABNORMAL HIGH (ref 65–99)

## 2016-05-11 LAB — HEPATITIS B SURFACE ANTIGEN: HEP B S AG: NEGATIVE

## 2016-05-11 MED ORDER — VANCOMYCIN HCL IN DEXTROSE 750-5 MG/150ML-% IV SOLN
750.0000 mg | INTRAVENOUS | Status: DC
  Administered 2016-05-11: 750 mg via INTRAVENOUS

## 2016-05-11 MED ORDER — RENA-VITE PO TABS
1.0000 | ORAL_TABLET | Freq: Every day | ORAL | Status: DC
  Administered 2016-05-11 – 2016-05-17 (×5): 1 via ORAL
  Filled 2016-05-11 (×5): qty 1

## 2016-05-11 MED ORDER — ZINC SULFATE 220 (50 ZN) MG PO CAPS
220.0000 mg | ORAL_CAPSULE | Freq: Every day | ORAL | Status: DC
  Administered 2016-05-11 – 2016-05-18 (×7): 220 mg via ORAL
  Filled 2016-05-11 (×9): qty 1

## 2016-05-11 MED ORDER — ORAL CARE MOUTH RINSE
15.0000 mL | Freq: Two times a day (BID) | OROMUCOSAL | Status: DC
  Administered 2016-05-11 – 2016-05-18 (×14): 15 mL via OROMUCOSAL

## 2016-05-11 MED ORDER — VANCOMYCIN HCL IN DEXTROSE 750-5 MG/150ML-% IV SOLN
INTRAVENOUS | Status: AC
  Filled 2016-05-11: qty 150

## 2016-05-11 MED ORDER — DM-GUAIFENESIN ER 30-600 MG PO TB12
1.0000 | ORAL_TABLET | Freq: Two times a day (BID) | ORAL | Status: DC
  Administered 2016-05-11 – 2016-05-15 (×5): 1 via ORAL
  Filled 2016-05-11 (×12): qty 1

## 2016-05-11 MED ORDER — POTASSIUM CHLORIDE CRYS ER 20 MEQ PO TBCR
40.0000 meq | EXTENDED_RELEASE_TABLET | Freq: Once | ORAL | Status: AC
  Administered 2016-05-11: 40 meq via ORAL
  Filled 2016-05-11: qty 2

## 2016-05-11 MED ORDER — DEXTROSE 5 % IV SOLN
2.0000 g | INTRAVENOUS | Status: DC
  Administered 2016-05-11: 2 g via INTRAVENOUS
  Filled 2016-05-11: qty 2

## 2016-05-11 MED ORDER — VITAMIN C 500 MG PO TABS
1000.0000 mg | ORAL_TABLET | Freq: Every day | ORAL | Status: DC
  Administered 2016-05-11 – 2016-05-18 (×7): 1000 mg via ORAL
  Filled 2016-05-11 (×8): qty 2

## 2016-05-11 NOTE — Progress Notes (Addendum)
Patient ID: Lance [name], male   [name]: August 10, 1940, 76 y.o.   MRN: [id number]    PROGRESS NOTE    Lance [name]  WUJ:[id number] [name]: 1940/11/16 DOA: 05/09/2016  PCP: [name], MD   Brief Narrative:  76 y.o. male  with past mental history significant for congestive heart failure with last 2 D ECHO in 02/2016 with EF 35% and mild myxomatous degeneration, end-stage renal disease, recurrent UTI, MS, from [facility] SNF who presented to ED with vomiting, diarrhea, shortness of breath. His O2 sat was 77%at the time EMS arrived. The physician at the SNF was working him up for urinary tract infection. He had not been given any antibiotics. Patient reported that he vomited blood.  Patient started on vancomycin and cefepime on admission. Patient placed on BiPAP due to hypoxia. Patient had a bloody melanotic stool. Patient also appeared to have blood on UA. Report given that patient was on heparin at his facility to prevent DVTs. INR was normal and initial lactic acid was normal.  Assessment & Plan:   Principal Problem: Sepsis secondary to pneumonia and Klebsiella UTI / Leukocytosis - Sepsis criteria met on admission with fever, tachycardia, tachypnea, hypotension, hypoxia, leukocytosis and source of infection presumed to be pneumonia versus UTI - CXR showed left basilar airspace opacity, likely within the left lower lobe - Urine culture is growing Klebsiella pneumonia - We'll stop vancomycin and use cefepime only until sensitivity report available - Blood cx negative   Active Problems: Hematemesis - Resolved at this point - Hemoglobin is 8.2 this am - Continue protonix 40 mg Q 12 hours   Hypokalemia - Possibly from lasix - Supplemented and check BMP in am - Check magnesium level   End stage renal disease on HD - HD MWF - Management per renal  Anemia of chronic disease - Due to ESRD - Hemoglobin stable   DM (diabetes mellitus) type II controlled with renal manifestation  with long term insulin use (HCC) - Continue sliding scale insulin - CBG's in past 24 hours: 87, 128, 116  Depression - Continue Zoloft   Essential hypertension - Continue carvedilol 3.125 mg BID  DVT prophylaxis: SCD's bilaterally  Code Status: DNR/DNI Family Communication: spoke with his son over the phone 1/7 Disposition Plan: not yet stable for discharge, likely by 05/14/2016   Consultants:   Nephrology   Procedures:   None  Antimicrobials:   Cefepime 05/09/2016 -->  Vanco 05/09/2016 --> 05/11/2016   Subjective: No overnight events.   Objective: Vitals:   05/11/16 1000 05/11/16 1030 05/11/16 1100 05/11/16 1127  BP: (!) 102/47 (!) 101/45 (!) 100/48 (!) 114/51  Pulse: 78 84 84 82  Resp: (!) 28 (!) 23 (!) 29 (!) 25  Temp:    98 F (36.7 C)  TempSrc:    Oral  SpO2: 96% 98% 97% 100%  Weight:    74 kg (163 lb 2.3 oz)  Height:        Intake/Output Summary (Last 24 hours) at 05/11/16 1151 Last data filed at 05/11/16 1127  Gross per 24 hour  Intake              290 ml  Output             1000 ml  Net             -710 ml   Filed Weights   05/09/16 2245 05/11/16 0727 05/11/16 1127  Weight: 74.5 kg (164 lb 3.9 oz) 75 kg (165 lb  5.5 oz) 74 kg (163 lb 2.3 oz)    Examination:  General exam: no distress  Respiratory system: No wheezing, no rhonchi  Cardiovascular system: S1 & S2 heard, rate controlled  Gastrointestinal system: (+) BS, non tender abd  Central nervous system: No focal neurological deficits. Extremities: No tenderness, palpable pulses  Skin: No rashes, lesions or ulcers, skin is warm and dry  Psychiatry: Mood & affect appropriate.   Data Reviewed: I have personally reviewed following labs and imaging studies  CBC:  Recent Labs Lab 05/09/16 1151 05/09/16 2017 05/10/16 0410 05/11/16 0500  WBC 9.5  --  11.9* 14.0*  NEUTROABS 7.7  --   --   --   HGB 9.4* 10.0* 9.2* 8.2*  HCT 29.8* 31.3* 28.7* 25.9*  MCV 97.7  --  97.3 97.0  PLT 199  --  192  366   Basic Metabolic Panel:  Recent Labs Lab 05/09/16 1151 05/10/16 0410 05/11/16 0500  NA 135 133* 135  K 3.9 3.4* 3.1*  CL 93* 94* 95*  CO2 '26 25 28  ' GLUCOSE 117* 146* 130*  BUN 62* 18 41*  CREATININE 6.87* 2.95* 4.62*  CALCIUM 8.3* 7.3* 8.1*   GFR: Estimated Creatinine Clearance: 14.3 mL/min (by C-G formula based on SCr of 4.62 mg/dL (H)). Liver Function Tests:  Recent Labs Lab 05/09/16 1151  AST 16  ALT 13*  ALKPHOS 67  BILITOT 0.8  PROT 7.3  ALBUMIN 2.1*   No results for input(s): LIPASE, AMYLASE in the last 168 hours. No results for input(s): AMMONIA in the last 168 hours. Coagulation Profile:  Recent Labs Lab 05/09/16 1509  INR 1.10   Cardiac Enzymes: No results for input(s): CKTOTAL, CKMB, CKMBINDEX, TROPONINI in the last 168 hours. BNP (last 3 results) No results for input(s): PROBNP in the last 8760 hours. HbA1C: No results for input(s): HGBA1C in the last 72 hours. CBG:  Recent Labs Lab 05/10/16 0750 05/10/16 1129 05/10/16 1646 05/10/16 2030 05/11/16 0029  GLUCAP 153* 171* 87 128* 116*   Lipid Profile: No results for input(s): CHOL, HDL, LDLCALC, TRIG, CHOLHDL, LDLDIRECT in the last 72 hours. Thyroid Function Tests: No results for input(s): TSH, T4TOTAL, FREET4, T3FREE, THYROIDAB in the last 72 hours. Anemia Panel: No results for input(s): VITAMINB12, FOLATE, FERRITIN, TIBC, IRON, RETICCTPCT in the last 72 hours. Urine analysis:    Component Value Date/Time   COLORURINE AMBER (A) 05/09/2016 1220   APPEARANCEUR CLOUDY (A) 05/09/2016 1220   LABSPEC 1.012 05/09/2016 1220   PHURINE 8.0 05/09/2016 1220   GLUCOSEU NEGATIVE 05/09/2016 1220   HGBUR LARGE (A) 05/09/2016 1220   BILIRUBINUR NEGATIVE 05/09/2016 1220   KETONESUR NEGATIVE 05/09/2016 1220   PROTEINUR 100 (A) 05/09/2016 1220   UROBILINOGEN 0.2 08/30/2014 2203   NITRITE NEGATIVE 05/09/2016 1220   LEUKOCYTESUR SMALL (A) 05/09/2016 1220   Sepsis  Labs: '@[email or web address]' (procalcitonin:4,lacticidven:4)   Urine culture     Status: Abnormal (Preliminary result)   Collection Time: 05/09/16 12:20 PM  Result Value Ref Range Status   Specimen Description URINE, CLEAN CATCH  Final   Special Requests Normal  Final   Culture >=100,000 COLONIES/mL GRAM NEGATIVE RODS (A)  Final   Report Status PENDING  Incomplete  MRSA PCR Screening     Status: Abnormal   Collection Time: 05/09/16  8:06 PM  Result Value Ref Range Status   MRSA by PCR POSITIVE (A) NEGATIVE Final      Radiology Studies: US Renal Result Date: 05/09/2016  Increased renal echogenicity bilaterally is  indicative of medical renal disease. No hydronephrosis seen on either side. There is a complex cystic mass arising from the upper pole right kidney. There is an area of apparent nodularity within this cyst, raising concern for potential cystic neoplasm. Further evaluation with pre and post contrast MRI or CT nonemergently should be considered. MRI is preferred in younger patients (due to lack of ionizing radiation) and for evaluating calcified lesion(s). Electronically Signed   By: Lowella Grip III M.D.   On: 05/09/2016 21:53   Dg Chest Port 1 View Result Date: 05/10/2016 Stable left basilar changes.   Dg Chest Port 1 View Result Date: 05/09/2016 1. Nonspecific left basilar airspace opacity, likely within the left lower lobe. Differential considerations include pleural fluid with atelectasis and/or infiltrate. Consider PA and lateral chest x-ray for further differentiation. 2. Slightly increased pulmonary vascular congestion now with mild interstitial edema. 3. Stable cardiomegaly. 4. Stable right IJ approach tunneled hemodialysis catheter.    Scheduled Meds: . ceFEPime (MAXIPIME) IV  2 g Intravenous Q M,W,F-2000  . Chlorhexidine Gluconate Cloth  6 each Topical Q0600  . dextromethorphan-guaiFENesin  1 tablet Oral BID  . [START ON 05/13/2016] ferric gluconate (FERRLECIT/NULECIT) IV  62.5  mg Intravenous Q Wed-HD  . insulin aspart  0-15 Units Subcutaneous Q4H  . mouth rinse  15 mL Mouth Rinse BID  . Melatonin  3 mg Oral QHS  . mupirocin ointment  1 application Nasal BID  . oxybutynin  5 mg Oral BID  . pantoprazole (PROTONIX) IV  40 mg Intravenous Q12H  . saccharomyces boulardii  250 mg Oral BID  . sertraline  50 mg Oral Daily  . vancomycin  750 mg Intravenous Q M,W,F-HD   Continuous Infusions:   LOS: 2 days    Time spent: 25 minutes  Greater than 50% of the time spent on counseling and coordinating the care.   Leisa Lenz, MD [facility] Pager [phone]  If 7PM-7AM, please contact night-coverage [email or web address] Password [email or web address] 05/11/2016, 11:51 AM

## 2016-05-11 NOTE — Progress Notes (Signed)
  [facility] Progress Note   Subjective: no c/o  Vitals:   05/11/16 0727 05/11/16 0732 05/11/16 0800 05/11/16 0830  BP: (!) 123/57 (!) 126/58 (!) 101/51 (!) 92/41  Pulse: 80 80 84 82  Resp: 20 (!) 29 (!) 21 (!) 26  Temp: 98.3 F (36.8 C)     TempSrc: Oral     SpO2: 98% 96% 97% 97%  Weight: 75 kg (165 lb 5.5 oz)     Height:        Inpatient medications: . carvedilol  3.125 mg Oral BID WC  . ceFEPime (MAXIPIME) IV  2 g Intravenous Q M,W,F-2000  . Chlorhexidine Gluconate Cloth  6 each Topical Q0600  . [START ON 05/13/2016] ferric gluconate (FERRLECIT/NULECIT) IV  62.5 mg Intravenous Q Wed-HD  . insulin aspart  0-15 Units Subcutaneous Q4H  . mouth rinse  15 mL Mouth Rinse BID  . Melatonin  3 mg Oral QHS  . mupirocin ointment  1 application Nasal BID  . oxybutynin  5 mg Oral BID  . pantoprazole (PROTONIX) IV  40 mg Intravenous Q12H  . saccharomyces boulardii  250 mg Oral BID  . sertraline  50 mg Oral Daily  . Vancomycin      . vancomycin  750 mg Intravenous Q M,W,F-HD    acetaminophen **OR** acetaminophen, ondansetron **OR** ondansetron (ZOFRAN) IV  Exam: [name], no distress, on HD No jvd Chest dec'd BS bilat RRR no mrg Abd soft ntnd w foley in place Ext bilat protective boots, R foot contracted R IJ cath/ R FA AVF+bruit  Dialysis: [facility]   3.5h   76kg    2/2 bath  Hep 2300   RFA AVF (03/24/16)/ R IJ cath  - Mirc 100 last 1/03  - Venofer 50/wk Labs: pth 35 P 3.5 Ca 11.7  Hb 9.5      Assessment: 1. Hypoxia/ SOB/ PNA /UTI - improving fevers, on abx, urine cx+GNR 2. ESRD on HD mwf 3. MS/ quadriplegic/ NGB - indwelling foley 4. HTN - bp's soft, dc coreg 5. Anemia next esa 1/17 6. Melena - resolved, no hep for now w hd 7. Sacral decub - per primary 8. 2HPTH - no chg meds 9. DM on insulin 10. Vol at dry wt  Plan - HD today, min UF   [name] MD [facility] pager [phone]   05/11/2016, 9:14 AM    Recent  Labs Lab 05/09/16 1151 05/10/16 0410 05/11/16 0500  NA 135 133* 135  K 3.9 3.4* 3.1*  CL 93* 94* 95*  CO2 26 25 28   GLUCOSE 117* 146* 130*  BUN 62* 18 41*  CREATININE 6.87* 2.95* 4.62*  CALCIUM 8.3* 7.3* 8.1*    Recent Labs Lab 05/09/16 1151  AST 16  ALT 13*  ALKPHOS 67  BILITOT 0.8  PROT 7.3  ALBUMIN 2.1*    Recent Labs Lab 05/09/16 1151 05/09/16 2017 05/10/16 0410 05/11/16 0500  WBC 9.5  --  11.9* 14.0*  NEUTROABS 7.7  --   --   --   HGB 9.4* 10.0* 9.2* 8.2*  HCT 29.8* 31.3* 28.7* 25.9*  MCV 97.7  --  97.3 97.0  PLT 199  --  192 221   Iron/TIBC/Ferritin/ %Sat    Component Value Date/Time   IRON 28 (L) 02/15/2016 0532   TIBC 193 (L) 02/15/2016 0532   FERRITIN 316 08/31/2014 0036   IRONPCTSAT 14 (L) 02/15/2016 0532

## 2016-05-11 NOTE — Progress Notes (Signed)
Pharmacy Antibiotic Note  Lance [name] is a 76 y.o. male with ESRD admitted on 05/09/2016 with SOB and cough.  Pharmacy has been consulted for vancomycin and cefepime dosing for PNA.   Patient is ESRD on HD- last HD session was overnight 1/6- tolerated 3.5h with BFR 400. Currently in HD.  Plan: Vancomycin  750 mg IV qMWF with each HD session Cefepime 2 g IV qMWF @ 2000 Follow c/s, clinical progression, HD schedule/tolerance, LOT  Height: 5\' 10"  (177.8 cm) Weight: 165 lb 5.5 oz (75 kg) IBW/kg (Calculated) : 73  Temp (24hrs), Avg:98.6 F (37 C), Min:98 F (36.7 C), Max:99.3 F (37.4 C)   Recent Labs Lab 05/09/16 1151 05/09/16 1216 05/09/16 1534 05/10/16 0410 05/11/16 0500  WBC 9.5  --   --  11.9* 14.0*  CREATININE 6.87*  --   --  2.95* 4.62*  LATICACIDVEN  --  1.46 1.49  --   --     Estimated Creatinine Clearance: 14.3 mL/min (by C-G formula based on SCr of 4.62 mg/dL (H)).    Allergies  Allergen Reactions  . No Known Allergies     Antimicrobials this admission: Vancomycin 1/6 >>  Cefepime 1/6 >>   Dose adjustments this admission: 1/8: cefepime changed to 2g IV qHD day  Microbiology results:  1/6 BCx: ngtd 1/6 UCx: 100K kleb pneumo 1/6 MRSA PCR: Positive    Thank you for allowing pharmacy to be a part of this patient's care.  Adasyn Mcadams D. Lorilyn Laitinen, PharmD, BCPS Clinical Pharmacist Pager: [phone] 05/11/2016 8:37 AM

## 2016-05-11 NOTE — Consult Note (Signed)
[place] Nurse wound consult note Reason for Consult: bilateral ischial wounds, POA from SNF Wound type: Stage 4 Pressure injury; right ischium; Stage 3 Pressure injury left ischium Pressure Injury POA: Yes Measurement: right ischium: 4cm x 3.5cm x 0.5cm with undermining from 11-2 o'clock that is 4.0cm  Left ischial wound: 2cm x 2cm x 0.2cm Wound bed: Right ischium: 95% clean, pink, moist, non granular;5% yellow Left ischium: 100% clean, pink, dry Drainage (amount, consistency, odor) scant from each site, no odor Periwound: intact, epibole of the wound edges of the right ischium, due to chronicity Dressing procedure/placement/frequency: Saline moistened gauze to the right ischial wound, change BID Hydrogel to the left ischial wound, change daily  Low air loss mattress in place for pressure redistribution Prevalon boots in place to protect and float heels.  Will request MVI, Zinc, Vit. C for patient from nephrology for wound healing.   Discussed POC with patient and bedside nurse.  Re consult if needed, will not follow at this time. Thanks  Rydge Texidor [employer], RN,CWOCN, CNS [phone])

## 2016-05-11 NOTE — Progress Notes (Signed)
No documented urine output since admission.  Pt has chronic foley (states he used to self cath TID but got the foley to make it easier).  Has been on HD for about 2 weeks per wife at bedside.  Bladder Scan done which did not show any urine retention.  Lower abdomen soft and not distended.  Pt does not remember the last time the foley was changed, no documented date on foley bag.

## 2016-05-12 ENCOUNTER — Encounter: Payer: Self-pay | Admitting: Internal Medicine

## 2016-05-12 DIAGNOSIS — I10 Essential (primary) hypertension: Secondary | ICD-10-CM

## 2016-05-12 LAB — CBC
HCT: 25.6 % — ABNORMAL LOW (ref 39.0–52.0)
Hemoglobin: 8.2 g/dL — ABNORMAL LOW (ref 13.0–17.0)
MCH: 31.3 pg (ref 26.0–34.0)
MCHC: 32 g/dL (ref 30.0–36.0)
MCV: 97.7 fL (ref 78.0–100.0)
PLATELETS: 202 10*3/uL (ref 150–400)
RBC: 2.62 MIL/uL — ABNORMAL LOW (ref 4.22–5.81)
RDW: 16.6 % — AB (ref 11.5–15.5)
WBC: 13.9 10*3/uL — AB (ref 4.0–10.5)

## 2016-05-12 LAB — BASIC METABOLIC PANEL
ANION GAP: 11 (ref 5–15)
BUN: 30 mg/dL — AB (ref 6–20)
CALCIUM: 8.4 mg/dL — AB (ref 8.9–10.3)
CO2: 23 mmol/L (ref 22–32)
Chloride: 99 mmol/L — ABNORMAL LOW (ref 101–111)
Creatinine, Ser: 3.02 mg/dL — ABNORMAL HIGH (ref 0.61–1.24)
GFR calc Af Amer: 22 mL/min — ABNORMAL LOW (ref >60)
GFR, EST NON AFRICAN AMERICAN: 19 mL/min — AB (ref >60)
GLUCOSE: 109 mg/dL — AB (ref 65–99)
Potassium: 4.4 mmol/L (ref 3.5–5.1)
Sodium: 133 mmol/L — ABNORMAL LOW (ref 135–145)

## 2016-05-12 LAB — GLUCOSE, CAPILLARY
GLUCOSE-CAPILLARY: 136 mg/dL — AB (ref 65–99)
GLUCOSE-CAPILLARY: 155 mg/dL — AB (ref 65–99)
GLUCOSE-CAPILLARY: 156 mg/dL — AB (ref 65–99)
Glucose-Capillary: 101 mg/dL — ABNORMAL HIGH (ref 65–99)
Glucose-Capillary: 104 mg/dL — ABNORMAL HIGH (ref 65–99)
Glucose-Capillary: 113 mg/dL — ABNORMAL HIGH (ref 65–99)
Glucose-Capillary: 183 mg/dL — ABNORMAL HIGH (ref 65–99)

## 2016-05-12 LAB — URINE CULTURE: SPECIAL REQUESTS: NORMAL

## 2016-05-12 LAB — MAGNESIUM: Magnesium: 1.9 mg/dL (ref 1.7–2.4)

## 2016-05-12 MED ORDER — NEPRO/CARBSTEADY PO LIQD
237.0000 mL | Freq: Three times a day (TID) | ORAL | Status: DC
  Administered 2016-05-12 – 2016-05-18 (×9): 237 mL via ORAL
  Filled 2016-05-12 (×22): qty 237

## 2016-05-12 MED ORDER — SODIUM CHLORIDE 0.9 % IV SOLN
500.0000 mg | Freq: Every day | INTRAVENOUS | Status: DC
  Administered 2016-05-12: 500 mg via INTRAVENOUS
  Filled 2016-05-12 (×2): qty 0.5

## 2016-05-12 MED ORDER — VANCOMYCIN HCL IN DEXTROSE 750-5 MG/150ML-% IV SOLN
750.0000 mg | INTRAVENOUS | Status: DC
  Administered 2016-05-13 – 2016-05-18 (×2): 750 mg via INTRAVENOUS
  Filled 2016-05-12 (×4): qty 150

## 2016-05-12 MED ORDER — CARVEDILOL 3.125 MG PO TABS
3.1250 mg | ORAL_TABLET | Freq: Two times a day (BID) | ORAL | Status: DC
  Administered 2016-05-12 – 2016-05-18 (×9): 3.125 mg via ORAL
  Filled 2016-05-12 (×10): qty 1

## 2016-05-12 NOTE — Consult Note (Signed)
   [facility] CM Inpatient Consult   05/12/2016  [name] 1940/09/23 [id number]   Patient assess for 3 admission in 6 months.  Patient on Medicare ACO registry.  Per chart review the patient is Lance [name] is a 76 y.o. male  with past mental history significant for congestive heart failure end-stage renal disease recurrent UTIs and multiple sclerosis presents emergency room from [facility] living [facility] rehabilitation with complaints of vomiting, diarrhea, shortness of breath. Patient was brought by EMS after was found out that his O2 saturation was 77%. Of note patient did miss his dialysis treatment the day prior to [place]. Patient is admitted with sepsis, pneumonia, and Klebsiella UTI on IV antibiotics. Patient's current disposition is to return to [place].  Made inpatient RNCM aware and that following for progression.   Will follow up for progression and disposition needs.  For questions, please contact:  Natividad Brood, RN BSN [facility] Liaison  [phone] business mobile phone Toll free office [phone]

## 2016-05-12 NOTE — Progress Notes (Addendum)
Patient ID: Lance [name], male   [name]: Nov 20, 1940, 76 y.o.   MRN: [id number]    PROGRESS NOTE    Lance [name]  TMA:[id number] [name]: Jan 15, 1941 DOA: 05/09/2016  PCP: [name], MD   Brief Narrative:  76 y.o. male  with past mental history significant for congestive heart failure with last 2 D ECHO in 02/2016 with EF 35% and mild myxomatous degeneration, end-stage renal disease, recurrent UTI, MS, from [facility] SNF who presented to ED with vomiting, diarrhea, shortness of breath. His O2 sat was 77%at the time EMS arrived. The physician at the SNF was working him up for urinary tract infection. He had not been given any antibiotics. Patient reported that he vomited blood.  Patient started on vancomycin and cefepime on admission. Patient placed on BiPAP due to hypoxia. Patient had a bloody melanotic stool. Patient also appeared to have blood on UA. Report given that patient was on heparin at his facility to prevent DVTs. INR was normal and initial lactic acid was normal.  Assessment & Plan:   Principal Problem: Sepsis secondary to left lower lobe pneumonia and Klebsiella and MRSA UTI / Leukocytosis - Sepsis criteria met on admission with fever, tachycardia, tachypnea, hypotension, hypoxia, leukocytosis and source of infection presumed to be pneumonia versus UTI - CXR showed left basilar airspace opacity, likely within the left lower lobe - Urine culture is growing Klebsiella pneumonia - Also has MRSA in urine - Continue vanco and change cefepime to meropenem  - Blood cx negative   Active Problems: Hematemesis - Resolved at this point - Hemoglobin is 8.2 this am, stable overall  - Continue protonix 40 mg Q 12 hours   Hypokalemia - Possibly from lasix - Supplemented and WNL  End stage renal disease on HD - HD MWF - Management per renal  Anemia of chronic disease - Due to ESRD - Hemoglobin stable  - Continue ferric gluconate with HD  DM (diabetes mellitus) type  II controlled with renal manifestation with long term insulin use (HCC) - Continue sliding scale insulin  Depression - Continue Zoloft   Essential hypertension - Continue carvedilol 3.125 mg BID  Stage 4 right ischium pressure ulcer / stage 3 left ischium pressure ulcer - Appreciate WOC assessment   DVT prophylaxis: SCD's bilaterally  Code Status: DNR/DNI Family Communication: spoke with his son over the phone 1/7; his wife over the phone 1/8  Disposition Plan: not yet stable for discharge, likely by 05/14/2016; will nee PT evaluation prior to discharge as well    Consultants:   Nephrology   PT  [place]  Procedures:   None  Antimicrobials:   Cefepime 05/09/2016 --> 05/12/2016  Meropenem 05/12/2016 -->  Vanco 05/09/2016 -->   Subjective: No overnight events.   Objective: Vitals:   05/12/16 0033 05/12/16 0437 05/12/16 0725 05/12/16 1108  BP: (!) 129/52 (!) 149/61 140/61 (!) 143/61  Pulse: 87 88 87 81  Resp: (!) 22 (!) 29 (!) 29 (!) 29  Temp: 99.1 F (37.3 C) 98.3 F (36.8 C) 98.4 F (36.9 C) 97.5 F (36.4 C)  TempSrc: Oral Oral Oral Oral  SpO2: 94% 95% 96% 97%  Weight:      Height:        Intake/Output Summary (Last 24 hours) at 05/12/16 1141 Last data filed at 05/12/16 1040  Gross per 24 hour  Intake              890 ml  Output  75 ml  Net              815 ml   Filed Weights   05/09/16 2245 05/11/16 0727 05/11/16 1127  Weight: 74.5 kg (164 lb 3.9 oz) 75 kg (165 lb 5.5 oz) 74 kg (163 lb 2.3 oz)    Examination:  General exam: no distress, calm and comfortable  Respiratory system: No wheezing, no rhonchi  Cardiovascular system: S1 & S2 heard, RRR Gastrointestinal system: (+) BS, non tender abd, non distended  Central nervous system: Nonfocal  Extremities: No tenderness, palpable pulses bilaterally  Skin: No rashes, lesions or ulcers, skin is warm and dry  Psychiatry: Normal mood and behavior   Data Reviewed: I have personally reviewed  following labs and imaging studies  CBC:  Recent Labs Lab 05/09/16 1151 05/09/16 2017 05/10/16 0410 05/11/16 0500 05/12/16 0615  WBC 9.5  --  11.9* 14.0* 13.9*  NEUTROABS 7.7  --   --   --   --   HGB 9.4* 10.0* 9.2* 8.2* 8.2*  HCT 29.8* 31.3* 28.7* 25.9* 25.6*  MCV 97.7  --  97.3 97.0 97.7  PLT 199  --  192 221 824   Basic Metabolic Panel:  Recent Labs Lab 05/09/16 1151 05/10/16 0410 05/11/16 0500 05/12/16 0615  NA 135 133* 135 133*  K 3.9 3.4* 3.1* 4.4  CL 93* 94* 95* 99*  CO2 '26 25 28 23  ' GLUCOSE 117* 146* 130* 109*  BUN 62* 18 41* 30*  CREATININE 6.87* 2.95* 4.62* 3.02*  CALCIUM 8.3* 7.3* 8.1* 8.4*  MG  --   --   --  1.9   GFR: Estimated Creatinine Clearance: 21.8 mL/min (by C-G formula based on SCr of 3.02 mg/dL (H)). Liver Function Tests:  Recent Labs Lab 05/09/16 1151  AST 16  ALT 13*  ALKPHOS 67  BILITOT 0.8  PROT 7.3  ALBUMIN 2.1*   No results for input(s): LIPASE, AMYLASE in the last 168 hours. No results for input(s): AMMONIA in the last 168 hours. Coagulation Profile:  Recent Labs Lab 05/09/16 1509  INR 1.10   Cardiac Enzymes: No results for input(s): CKTOTAL, CKMB, CKMBINDEX, TROPONINI in the last 168 hours. BNP (last 3 results) No results for input(s): PROBNP in the last 8760 hours. HbA1C: No results for input(s): HGBA1C in the last 72 hours. CBG:  Recent Labs Lab 05/11/16 2101 05/12/16 0011 05/12/16 0427 05/12/16 0724 05/12/16 1107  GLUCAP 151* 104* 113* 101* 183*   Lipid Profile: No results for input(s): CHOL, HDL, LDLCALC, TRIG, CHOLHDL, LDLDIRECT in the last 72 hours. Thyroid Function Tests: No results for input(s): TSH, T4TOTAL, FREET4, T3FREE, THYROIDAB in the last 72 hours. Anemia Panel: No results for input(s): VITAMINB12, FOLATE, FERRITIN, TIBC, IRON, RETICCTPCT in the last 72 hours. Urine analysis:    Component Value Date/Time   COLORURINE AMBER (A) 05/09/2016 1220   APPEARANCEUR CLOUDY (A) 05/09/2016 1220    LABSPEC 1.012 05/09/2016 1220   PHURINE 8.0 05/09/2016 1220   GLUCOSEU NEGATIVE 05/09/2016 1220   HGBUR LARGE (A) 05/09/2016 1220   BILIRUBINUR NEGATIVE 05/09/2016 1220   KETONESUR NEGATIVE 05/09/2016 1220   PROTEINUR 100 (A) 05/09/2016 1220   UROBILINOGEN 0.2 08/30/2014 2203   NITRITE NEGATIVE 05/09/2016 1220   LEUKOCYTESUR SMALL (A) 05/09/2016 1220   Sepsis Labs: '@LABRCNTIP' (procalcitonin:4,lacticidven:4)   Urine culture     Status: Abnormal (Preliminary result)   Collection Time: 05/09/16 12:20 PM  Result Value Ref Range Status   Specimen Description URINE, CLEAN CATCH  Final   Special Requests Normal  Final   Culture >=100,000 COLONIES/mL GRAM NEGATIVE RODS (A)  Final   Report Status PENDING  Incomplete  MRSA PCR Screening     Status: Abnormal   Collection Time: 05/09/16  8:06 PM  Result Value Ref Range Status   MRSA by PCR POSITIVE (A) NEGATIVE Final      Radiology Studies: US Renal Result Date: 05/09/2016  Increased renal echogenicity bilaterally is indicative of medical renal disease. No hydronephrosis seen on either side. There is a complex cystic mass arising from the upper pole right kidney. There is an area of apparent nodularity within this cyst, raising concern for potential cystic neoplasm. Further evaluation with pre and post contrast MRI or CT nonemergently should be considered. MRI is preferred in younger patients (due to lack of ionizing radiation) and for evaluating calcified lesion(s). Electronically Signed   By: Lowella Grip III M.D.   On: 05/09/2016 21:53   Dg Chest Port 1 View Result Date: 05/10/2016 Stable left basilar changes.   Dg Chest Port 1 View Result Date: 05/09/2016 1. Nonspecific left basilar airspace opacity, likely within the left lower lobe. Differential considerations include pleural fluid with atelectasis and/or infiltrate. Consider PA and lateral chest x-ray for further differentiation. 2. Slightly increased pulmonary vascular congestion  now with mild interstitial edema. 3. Stable cardiomegaly. 4. Stable right IJ approach tunneled hemodialysis catheter.    Scheduled Meds: . ceFEPime (MAXIPIME) IV  2 g Intravenous Q M,W,F-2000  . Chlorhexidine Gluconate Cloth  6 each Topical Q0600  . dextromethorphan-guaiFENesin  1 tablet Oral BID  . [START ON 05/13/2016] ferric gluconate (FERRLECIT/NULECIT) IV  62.5 mg Intravenous Q Wed-HD  . insulin aspart  0-15 Units Subcutaneous Q4H  . mouth rinse  15 mL Mouth Rinse BID  . Melatonin  3 mg Oral QHS  . multivitamin  1 tablet Oral QHS  . mupirocin ointment  1 application Nasal BID  . oxybutynin  5 mg Oral BID  . pantoprazole (PROTONIX) IV  40 mg Intravenous Q12H  . saccharomyces boulardii  250 mg Oral BID  . sertraline  50 mg Oral Daily  . vitamin C  1,000 mg Oral Daily  . zinc sulfate  220 mg Oral Daily   Continuous Infusions:   LOS: 3 days    Time spent: 25 minutes  Greater than 50% of the time spent on counseling and coordinating the care.   Leisa Lenz, MD [facility] Pager [phone]  If 7PM-7AM, please contact night-coverage [email or web address] Password [email or web address] 05/12/2016, 11:41 AM

## 2016-05-12 NOTE — Progress Notes (Signed)
Opened in error

## 2016-05-12 NOTE — Care Management Important Message (Signed)
Important Message  Patient Details  Name: Lance [name] MRN: [id number] Date of Birth: Jan 10, 1941   Medicare Important Message Given:  Yes    Bethena Roys, RN 05/12/2016, 11:37 AM

## 2016-05-12 NOTE — Progress Notes (Signed)
[facility] Progress Note   Subjective: no new c/o  Vitals:   05/12/16 0033 05/12/16 0437 05/12/16 0725 05/12/16 1108  BP: (!) 129/52 (!) 149/61 140/61 (!) 143/61  Pulse: 87 88 87 81  Resp: (!) 22 (!) 29 (!) 29 (!) 29  Temp: 99.1 F (37.3 C) 98.3 F (36.8 C) 98.4 F (36.9 C) 97.5 F (36.4 C)  TempSrc: Oral Oral Oral Oral  SpO2: 94% 95% 96% 97%  Weight:      Height:        Inpatient medications: . carvedilol  3.125 mg Oral BID WC  . Chlorhexidine Gluconate Cloth  6 each Topical Q0600  . dextromethorphan-guaiFENesin  1 tablet Oral BID  . [START ON 05/13/2016] ferric gluconate (FERRLECIT/NULECIT) IV  62.5 mg Intravenous Q Wed-HD  . insulin aspart  0-15 Units Subcutaneous Q4H  . mouth rinse  15 mL Mouth Rinse BID  . Melatonin  3 mg Oral QHS  . meropenem (MERREM) IV  500 mg Intravenous Q2000  . multivitamin  1 tablet Oral QHS  . mupirocin ointment  1 application Nasal BID  . oxybutynin  5 mg Oral BID  . pantoprazole (PROTONIX) IV  40 mg Intravenous Q12H  . saccharomyces boulardii  250 mg Oral BID  . sertraline  50 mg Oral Daily  . [START ON 05/13/2016] vancomycin  750 mg Intravenous Q M,W,F-HD  . vitamin C  1,000 mg Oral Daily  . zinc sulfate  220 mg Oral Daily    acetaminophen **OR** acetaminophen, ondansetron **OR** ondansetron (ZOFRAN) IV  Exam: [name], no distress, on HD No jvd Chest dec'd BS bilat RRR no mrg Abd soft ntnd w foley in place Ext bilat protective boots, R foot contracted R IJ cath/ R FA AVF+bruit  Dialysis: [facility]   3.5h   76kg    2/2 bath  Hep 2300   RFA AVF (03/24/16)/ R IJ cath  - Mirc 100 last 1/03  - Venofer 50/wk Labs: pth 35 P 3.5 Ca 11.7  Hb 9.5      Assessment: 1. Hypoxia/ SOB/ PNA /UTI - improving fevers, on abx, urine cx+GNR 2. ESRD on HD mwf, maturing AVF 3. MS/ quadriplegic/ NGB - indwelling foley 4. HTN - bp's better off of coreg 5. Anemia next esa 1/17 6. Melena - resolved, no hep for now w  hd 7. Sacral decub - per primary 8. 2HPTH - no chg meds 9. DM on insulin 10. Vol at dry wt 11. EOL - discussed EOL issues, poor QOL , pt is considering options including comfort care; he will d/w his family.   Plan - HD Gretta Arab MD [facility] pager [phone]   05/12/2016, 2:11 PM    Recent Labs Lab 05/10/16 0410 05/11/16 0500 05/12/16 0615  NA 133* 135 133*  K 3.4* 3.1* 4.4  CL 94* 95* 99*  CO2 25 28 23   GLUCOSE 146* 130* 109*  BUN 18 41* 30*  CREATININE 2.95* 4.62* 3.02*  CALCIUM 7.3* 8.1* 8.4*    Recent Labs Lab 05/09/16 1151  AST 16  ALT 13*  ALKPHOS 67  BILITOT 0.8  PROT 7.3  ALBUMIN 2.1*    Recent Labs Lab 05/09/16 1151  05/10/16 0410 05/11/16 0500 05/12/16 0615  WBC 9.5  --  11.9* 14.0* 13.9*  NEUTROABS 7.7  --   --   --   --   HGB 9.4*  < > 9.2* 8.2* 8.2*  HCT 29.8*  < >  28.7* 25.9* 25.6*  MCV 97.7  --  97.3 97.0 97.7  PLT 199  --  192 221 202  < > = values in this interval not displayed. Iron/TIBC/Ferritin/ %Sat    Component Value Date/Time   IRON 28 (L) 02/15/2016 0532   TIBC 193 (L) 02/15/2016 0532   FERRITIN 316 08/31/2014 0036   IRONPCTSAT 14 (L) 02/15/2016 0532

## 2016-05-12 NOTE — Progress Notes (Signed)
Initial Nutrition Assessment  DOCUMENTATION CODES:   Non-severe (moderate) malnutrition in context of chronic illness  INTERVENTION:    Nepro Shake po TID, each supplement provides 425 kcal and 19 grams protein  NUTRITION DIAGNOSIS:   Malnutrition related to chronic illness as evidenced by mild depletion of body fat, mild depletion of muscle mass, percent weight loss (7.4% weight loss within 3 months).  GOAL:   Patient will meet greater than or equal to 90% of their needs  MONITOR:   PO intake, Supplement acceptance, Skin  REASON FOR ASSESSMENT:   Malnutrition Screening Tool    ASSESSMENT:   76 y.o. male  with past mental history significant for congestive heart failure with last 2 D ECHO in 02/2016 with EF 35% and mild myxomatous degeneration, end-stage renal disease, recurrent UTI, MS, from [facility] SNF who presented to ED with vomiting, diarrhea, shortness of breath.  Patient reports recent poor appetite and intake, but he thinks it is improving. He usually drinks Premier Protein Shakes and Nepro Shakes. Nutrition-Focused physical exam completed. Findings are mild-moderate fat depletion, mild-moderate and severe muscle depletion, and no edema.  Labs reviewed.  Medications reviewed and include renal MVI, vitamin C, and zinc.  Diet Order:  Diet regular Room service appropriate? Yes; Fluid consistency: Thin  Skin:  Wound (see comment) (stage II and III to sacrum; unstageable to buttocks)  Last BM:  1/9  Height:   Ht Readings from Last 1 Encounters:  05/09/16 5\' 10"  (1.778 m)    Weight:   Wt Readings from Last 1 Encounters:  05/11/16 163 lb 2.3 oz (74 kg)    Ideal Body Weight:  75.5 kg  BMI:  Body mass index is 23.41 kg/m.  Estimated Nutritional Needs:   Kcal:  2200-2400  Protein:  100-120 gm  Fluid:  1.2 L  EDUCATION NEEDS:   No education needs identified at this time  Molli Barrows, [place], [place], [place] Pager [phone] After Hours Pager  [phone]

## 2016-05-12 NOTE — Progress Notes (Signed)
Pt family contacted RN and would like an update of the plan of care for their father from the provider. Please call either Corene Cornea at [phone] or Gayla Doss at [phone].

## 2016-05-12 NOTE — Progress Notes (Signed)
Pharmacy Antibiotic Note  Lance [name] is a 76 y.o. male admitted on 05/09/2016 with sepsis and UTI.  Pharmacy has been consulted for Vancomycin and Merropenem dosing.  Antibiotics changed based on urine cx results/resistance.    Started on Vancomycin 1/6 and received dose after HD 1/8.  Will resume at previous dosing. Unfortunately all Carbapenems are dosed q24h in ESRD patients and cannot be solely scheduled with HD outpatient.  Plan: Merrem 500mg  IV q24h - first now, then schedule in the evening to be after HD Vancomycin 750mg  IV qHD-MWF  Height: 5\' 10"  (177.8 cm) Weight: 163 lb 2.3 oz (74 kg) IBW/kg (Calculated) : 73  Temp (24hrs), Avg:98.2 F (36.8 C), Min:97.5 F (36.4 C), Max:99.1 F (37.3 C)   Recent Labs Lab 05/09/16 1151 05/09/16 1216 05/09/16 1534 05/10/16 0410 05/11/16 0500 05/12/16 0615  WBC 9.5  --   --  11.9* 14.0* 13.9*  CREATININE 6.87*  --   --  2.95* 4.62* 3.02*  LATICACIDVEN  --  1.46 1.49  --   --   --     Estimated Creatinine Clearance: 21.8 mL/min (by C-G formula based on SCr of 3.02 mg/dL (H)).    Allergies  Allergen Reactions  . No Known Allergies     Antimicrobials this admission: Cefepime 1/6>> 1/9 Vancomycin 1/6>> 1/8, restart 1/9 *got 1750mg  as a load, s/p HD on 1/6 (3.5h BFR 400)and given 750mg  after. Est level 1/8 pre-HD ~27 Merrem 1/9 >>  Dose adjustments this admission: 1/8- cefepime changed to 2q qHD day as back on schedule  Microbiology results: 1/6 BCx: ngtd 1/6 UCx: 100K kleb pneumo (S: Primax, Gent) and 100K MRSA 1/6 MRSA PCR: Positive   Thank you for allowing pharmacy to be a part of this patient's care.  [employer], Pharm.D., BCPS Clinical Pharmacist Pager [phone] 05/12/2016 12:15 PM

## 2016-05-12 NOTE — Care Management Note (Addendum)
Case Management Note  Patient Details  Name: Lance [name] MRN: [id number] Date of Birth: 1941/03/26  Subjective/Objective:  Pt presented for Vomiting, Diarrhea and SOB. Pt being worked up for Sepsis secondary to pneumonia and Klebsiella UTI / Leukocytosis. Initiated on IV Maxipime and Vancomycin.   Action/Plan: Pt is from [facility]. Plan will be to return to [facility] once stable. CSW is aware. CM will continue to monitor.   Expected Discharge Date:                  Expected Discharge Plan:  [place]  In-House Referral:  Clinical Social Work  Discharge planning Services  CM Consult  Post Acute Care Choice:  NA Choice offered to:  NA  DME Arranged:  N/A DME Agency:  NA  HH Arranged:  NA HH Agency:  NA  Status of Service:  Completed, signed off  If discussed at [place] of Stay Meetings, dates discussed:  05-14-16  Additional Comments: [place] 05-18-16 Jacqlyn Krauss, RN, BSN [phone] Plan will be for d/c to SNF [facility] today. CSW assisting with disposition needs.  Bethena Roys, RN 05/12/2016, 11:42 AM

## 2016-05-13 DIAGNOSIS — J181 Lobar pneumonia, unspecified organism: Secondary | ICD-10-CM

## 2016-05-13 LAB — GLUCOSE, CAPILLARY
GLUCOSE-CAPILLARY: 133 mg/dL — AB (ref 65–99)
GLUCOSE-CAPILLARY: 173 mg/dL — AB (ref 65–99)
GLUCOSE-CAPILLARY: 139 mg/dL — AB (ref 65–99)
GLUCOSE-CAPILLARY: 161 mg/dL — AB (ref 65–99)
Glucose-Capillary: 151 mg/dL — ABNORMAL HIGH (ref 65–99)
Glucose-Capillary: 161 mg/dL — ABNORMAL HIGH (ref 65–99)

## 2016-05-13 LAB — CBC
HCT: 24.1 % — ABNORMAL LOW (ref 39.0–52.0)
Hemoglobin: 8 g/dL — ABNORMAL LOW (ref 13.0–17.0)
MCH: 32.5 pg (ref 26.0–34.0)
MCHC: 33.2 g/dL (ref 30.0–36.0)
MCV: 98 fL (ref 78.0–100.0)
Platelets: 216 10*3/uL (ref 150–400)
RBC: 2.46 MIL/uL — AB (ref 4.22–5.81)
RDW: 16.8 % — ABNORMAL HIGH (ref 11.5–15.5)
WBC: 10.7 10*3/uL — AB (ref 4.0–10.5)

## 2016-05-13 MED ORDER — VANCOMYCIN HCL IN DEXTROSE 750-5 MG/150ML-% IV SOLN
INTRAVENOUS | Status: AC
  Administered 2016-05-13: 750 mg via INTRAVENOUS
  Filled 2016-05-13: qty 150

## 2016-05-13 MED ORDER — SODIUM CHLORIDE 0.9 % IV SOLN: 100.0000 mL | INTRAVENOUS | Status: DC | PRN

## 2016-05-13 MED ORDER — SODIUM CHLORIDE 0.9 % IV SOLN
500.0000 mg | Freq: Every day | INTRAVENOUS | Status: DC
  Administered 2016-05-13 – 2016-05-17 (×5): 500 mg via INTRAVENOUS
  Filled 2016-05-13 (×7): qty 0.5

## 2016-05-13 MED ORDER — LIDOCAINE-PRILOCAINE 2.5-2.5 % EX CREA: 1.0000 "application " | TOPICAL_CREAM | CUTANEOUS | Status: DC | PRN

## 2016-05-13 MED ORDER — HEPARIN SODIUM (PORCINE) 1000 UNIT/ML DIALYSIS
2300.0000 [IU] | Freq: Once | INTRAMUSCULAR | Status: DC
Start: 2016-05-14 — End: 2016-05-13

## 2016-05-13 MED ORDER — LIDOCAINE HCL (PF) 1 % IJ SOLN: 5.0000 mL | INTRAMUSCULAR | Status: DC | PRN

## 2016-05-13 MED ORDER — ALTEPLASE 2 MG IJ SOLR: 2.0000 mg | Freq: Once | INTRAMUSCULAR | Status: DC | PRN

## 2016-05-13 MED ORDER — PENTAFLUOROPROP-TETRAFLUOROETH EX AERO: 1.0000 "application " | INHALATION_SPRAY | CUTANEOUS | Status: DC | PRN

## 2016-05-13 MED ORDER — HEPARIN SODIUM (PORCINE) 1000 UNIT/ML DIALYSIS: 1000.0000 [IU] | INTRAMUSCULAR | Status: DC | PRN

## 2016-05-13 MED ORDER — PANTOPRAZOLE SODIUM 40 MG PO TBEC
40.0000 mg | DELAYED_RELEASE_TABLET | Freq: Two times a day (BID) | ORAL | Status: DC
  Administered 2016-05-13 – 2016-05-18 (×11): 40 mg via ORAL
  Filled 2016-05-13 (×13): qty 1

## 2016-05-13 NOTE — Procedures (Signed)
  I was present at this dialysis session, have reviewed the session itself and made  appropriate changes [name] MD [place] pager [phone]   05/13/2016, 9:27 AM

## 2016-05-13 NOTE — Progress Notes (Signed)
Pt has not voided today. Bladder scan obtained that showed no urine. Pt denies any pain or pressure.  Will cont to monitor pt.

## 2016-05-13 NOTE — Progress Notes (Signed)
PT Cancellation Note  Patient Details Name: Lance [name] MRN: [id number] [name]: 12-22-1940   Cancelled Treatment:    Reason Eval/Treat Not Completed: PT screened, no needs identified, will sign off. Pt resides at [facility] and uses mechanical lift for all OOB. Plan is to return to SNF. Reviewed chart and spoke with pt and is at baseline level.  No skilled acute PT needs identified and will sign off at this time.    Lance [name] 05/13/2016, 2:13 PM

## 2016-05-13 NOTE — Progress Notes (Addendum)
Patient ID: Lance [name], male   [name]: 1941-04-21, 76 y.o.   MRN: [id number]    PROGRESS NOTE    Rodgers Likes Criado  HAF:[id number] [name]: Jul 05, 1940 DOA: 05/09/2016  PCP: [name], MD   Brief Narrative:  76 y.o. male  with past mental history significant for congestive heart failure with last 2 D ECHO in 02/2016 with EF 35% and mild myxomatous degeneration, end-stage renal disease, recurrent UTI, MS, from [facility] who presented to ED with vomiting, diarrhea, shortness of breath. His O2 sat was 77%at the time EMS arrived. The physician at the SNF was working him up for urinary tract infection. He had not been given any antibiotics. Patient reported that he vomited blood.  Patient started on vancomycin and cefepime on admission. Patient placed on BiPAP due to hypoxia. Patient had a bloody melanotic stool. Patient also appeared to have blood on UA. Report given that patient was on heparin at his facility to prevent DVTs. INR was normal and initial lactic acid was normal.  Assessment & Plan:   Principal Problem: Sepsis secondary to left lower lobe pneumonia and Klebsiella and MRSA UTI / Leukocytosis - Sepsis criteria met on admission with fever, tachycardia, tachypnea, hypotension, hypoxia, leukocytosis - Source of infection likely Klebsiella and MRSA UTI as well as pneumonia - CXR showed left basilar airspace opacity, likely within the left lower lobe - Urine culture is growing Klebsiella pneumonia and MRSA - Continue vanco and meropenem  - Blood cx negative   Active Problems: Hematemesis - Resolved  - Hemoglobin overall stable  - Continue protonix 40 mg Q 12 hours   Hypokalemia - Possibly from lasix - Supplemented and WNL - Check BMP in am  End stage renal disease on HD - HD MWF - Management per renal  Anemia of chronic disease - Due to ESRD - Hemoglobin stable  - Continue ferric gluconate with HD  DM (diabetes mellitus) type II controlled with renal  manifestation with long term insulin use (HCC) - Continue sliding scale insulin - CBG's in past 24 hours: 155, 133, 173  Depression - Continue Zoloft   Essential hypertension - Continue carvedilol 3.125 mg BID  Stage 4 right ischium pressure ulcer / stage 3 left ischium pressure ulcer - Appreciate WOC assessment  - Stage 4 Pressure injury; right ischium; Stage 3 Pressure injury left ischium - Measurement: right ischium: 4cm x 3.5cm x 0.5cm with undermining from 11-2 o'clock that is 4.0cm  - Left ischial wound: 2cm x 2cm x 0.2cm - Dressing procedure/placement/frequency: Saline moistened gauze to the right ischial wound, change BID. Hydrogel to the left ischial wound, change daily. Low air loss mattress in place for pressure redistribution. Prevalon boots in place to protect and float heels.  DVT prophylaxis: SCD's bilaterally  Code Status: DNR/DNI Family Communication: family not at the bedside this am  Disposition Plan: not yet stable for discharge, likely by 05/15/2016   Consultants:   Nephrology   PT  [place]  Procedures:   None  Antimicrobials:   Cefepime 05/09/2016 --> 05/12/2016  Meropenem 05/12/2016 -->  Vanco 05/09/2016 -->   Subjective: No overnight events.   Objective: Vitals:   05/13/16 0900 05/13/16 0930 05/13/16 1000 05/13/16 1030  BP: (!) 115/55 (!) 117/52 (!) 107/50 (!) 117/53  Pulse: 81 82 90 85  Resp: (!) _0 (!) 28  Temp:      TempSrc:      SpO2:      Weight:  Height:        Intake/Output Summary (Last 24 hours) at 05/13/16 1136 Last data filed at 05/13/16 0600  Gross per 24 hour  Intake              842 ml  Output                0 ml  Net              842 ml   Filed Weights   05/11/16 0727 05/11/16 1127 05/13/16 0450  Weight: 75 kg (165 lb 5.5 oz) 74 kg (163 lb 2.3 oz) 71 kg (156 lb 8.4 oz)    Examination:  General exam: no distress Respiratory system: diminished breath sounds, no wheezing  Cardiovascular system: S1 & S2 heard,  Rate controlled  Gastrointestinal system: (+) BS, non tender abd, non distended  Central nervous system: No focal deficits  Extremities: palpable pulses, no tenderness, has prevalon boots on  Skin: warm and dry  Psychiatry: Normal mood and behavior, no agitation or restlessness   Data Reviewed: I have personally reviewed following labs and imaging studies  CBC:  Recent Labs Lab 05/09/16 1151 05/09/16 2017 05/10/16 0410 05/11/16 0500 05/12/16 0615 05/13/16 0607  WBC 9.5  --  11.9* 14.0* 13.9* 10.7*  NEUTROABS 7.7  --   --   --   --   --   HGB 9.4* 10.0* 9.2* 8.2* 8.2* 8.0*  HCT 29.8* 31.3* 28.7* 25.9* 25.6* 24.1*  MCV 97.7  --  97.3 97.0 97.7 98.0  PLT 199  --  192 221 202 216   Basic Metabolic Panel:  Recent Labs Lab 05/09/16 1151 05/10/16 0410 05/11/16 0500 05/12/16 0615  NA 135 133* 135 133*  K 3.9 3.4* 3.1* 4.4  CL 93* 94* 95* 99*  CO2 26 25 28 23  GLUCOSE 117* 146* 130* 109*  BUN 62* 18 41* 30*  CREATININE 6.87* 2.95* 4.62* 3.02*  CALCIUM 8.3* 7.3* 8.1* 8.4*  MG  --   --   --  1.9   GFR: Estimated Creatinine Clearance: 21.2 mL/min (by C-G formula based on SCr of 3.02 mg/dL (H)). Liver Function Tests:  Recent Labs Lab 05/09/16 1151  AST 16  ALT 13*  ALKPHOS 67  BILITOT 0.8  PROT 7.3  ALBUMIN 2.1*   No results for input(s): LIPASE, AMYLASE in the last 168 hours. No results for input(s): AMMONIA in the last 168 hours. Coagulation Profile:  Recent Labs Lab 05/09/16 1509  INR 1.10   Cardiac Enzymes: No results for input(s): CKTOTAL, CKMB, CKMBINDEX, TROPONINI in the last 168 hours. BNP (last 3 results) No results for input(s): PROBNP in the last 8760 hours. HbA1C: No results for input(s): HGBA1C in the last 72 hours. CBG:  Recent Labs Lab 05/12/16 1715 05/12/16 2031 05/12/16 2351 05/13/16 0359 05/13/16 0726  GLUCAP 156* 136* 155* 133* 173*   Lipid Profile: No results for input(s): CHOL, HDL, LDLCALC, TRIG, CHOLHDL, LDLDIRECT in the  last 72 hours. Thyroid Function Tests: No results for input(s): TSH, T4TOTAL, FREET4, T3FREE, THYROIDAB in the last 72 hours. Anemia Panel: No results for input(s): VITAMINB12, FOLATE, FERRITIN, TIBC, IRON, RETICCTPCT in the last 72 hours. Urine analysis:    Component Value Date/Time   COLORURINE AMBER (A) 05/09/2016 1220   APPEARANCEUR CLOUDY (A) 05/09/2016 1220   LABSPEC 1.012 05/09/2016 1220   PHURINE 8.0 05/09/2016 1220   GLUCOSEU NEGATIVE 05/09/2016 1220   HGBUR LARGE (A) 05/09/2016 1220   BILIRUBINUR NEGATIVE   05/09/2016 1220   KETONESUR NEGATIVE 05/09/2016 1220   PROTEINUR 100 (A) 05/09/2016 1220   UROBILINOGEN 0.2 08/30/2014 2203   NITRITE NEGATIVE 05/09/2016 1220   LEUKOCYTESUR SMALL (A) 05/09/2016 1220   Sepsis Labs: @LABRCNTIP(procalcitonin:4,lacticidven:4)   Urine culture     Status: Abnormal (Preliminary result)   Collection Time: 05/09/16 12:20 PM  Result Value Ref Range Status   Specimen Description URINE, CLEAN CATCH  Final   Special Requests Normal  Final   Culture >=100,000 COLONIES/mL GRAM NEGATIVE RODS (A)  Final   Report Status PENDING  Incomplete  MRSA PCR Screening     Status: Abnormal   Collection Time: 05/09/16  8:06 PM  Result Value Ref Range Status   MRSA by PCR POSITIVE (A) NEGATIVE Final      Radiology Studies: Us Renal Result Date: 05/09/2016  Increased renal echogenicity bilaterally is indicative of medical renal disease. No hydronephrosis seen on either side. There is a complex cystic mass arising from the upper pole right kidney. There is an area of apparent nodularity within this cyst, raising concern for potential cystic neoplasm. Further evaluation with pre and post contrast MRI or CT nonemergently should be considered. MRI is preferred in younger patients (due to lack of ionizing radiation) and for evaluating calcified lesion(s). Electronically Signed   By: William  Woodruff III M.D.   On: 05/09/2016 21:53   Dg Chest Port 1 View Result  Date: 05/10/2016 Stable left basilar changes.   Dg Chest Port 1 View Result Date: 05/09/2016 1. Nonspecific left basilar airspace opacity, likely within the left lower lobe. Differential considerations include pleural fluid with atelectasis and/or infiltrate. Consider PA and lateral chest x-ray for further differentiation. 2. Slightly increased pulmonary vascular congestion now with mild interstitial edema. 3. Stable cardiomegaly. 4. Stable right IJ approach tunneled hemodialysis catheter.    Scheduled Meds: . carvedilol  3.125 mg Oral BID WC  . Chlorhexidine Gluconate Cloth  6 each Topical Q0600  . dextromethorphan-guaiFENesin  1 tablet Oral BID  . feeding supplement (NEPRO CARB STEADY)  237 mL Oral TID BM  . ferric gluconate (FERRLECIT/NULECIT) IV  62.5 mg Intravenous Q Wed-HD  . [START ON 05/14/2016] heparin  2,300 Units Dialysis Once in dialysis  . insulin aspart  0-15 Units Subcutaneous Q4H  . mouth rinse  15 mL Mouth Rinse BID  . Melatonin  3 mg Oral QHS  . meropenem (MERREM) IV  500 mg Intravenous Q2000  . multivitamin  1 tablet Oral QHS  . mupirocin ointment  1 application Nasal BID  . oxybutynin  5 mg Oral BID  . pantoprazole (PROTONIX) IV  40 mg Intravenous Q12H  . saccharomyces boulardii  250 mg Oral BID  . sertraline  50 mg Oral Daily  . vancomycin  750 mg Intravenous Q M,W,F-HD  . vitamin C  1,000 mg Oral Daily  . zinc sulfate  220 mg Oral Daily   Continuous Infusions:   LOS: 4 days    Time spent: 25 minutes  Greater than 50% of the time spent on counseling and coordinating the care.   , , MD [facility] Pager [phone]  If 7PM-7AM, please contact night-coverage [email or web address] Password [email or web address] 05/13/2016, 11:36 AM     

## 2016-05-13 NOTE — Progress Notes (Signed)
PT Note: Pt off of the unit at dialysis.  Will check back as schedule permits. Lance Glad [name], [place] Pager [phone] 05/13/2016

## 2016-05-13 NOTE — Clinical Social Work Placement (Signed)
   CLINICAL SOCIAL WORK PLACEMENT  NOTE  Date:  05/13/2016  Patient Details  Name: ABDULMALIK DARCO MRN: [id number] Date of Birth: 05-05-1940  Clinical Social Work is seeking post-discharge placement for this patient at the [place] level of care (*CSW will initial, date and re-position this form in  chart as items are completed):      Patient/family provided with [place] Work Department's list of facilities offering this level of care within the geographic area requested by the patient (or if unable, by the patient's family).  Yes   Patient/family informed of their freedom to choose among providers that offer the needed level of care, that participate in Medicare, Medicaid or managed care program needed by the patient, have an available bed and are willing to accept the patient.      Patient/family informed of [facility]'s ownership interest in [facility] and [facility], as well as of the fact that they are under no obligation to receive care at these facilities.  PASRR submitted to EDS on       PASRR number received on       Existing PASRR number confirmed on 05/13/16     FL2 transmitted to all facilities in geographic area requested by pt/family on 05/13/16     FL2 transmitted to all facilities within larger geographic area on       Patient informed that his/her managed care company has contracts with or will negotiate with certain facilities, including the following:        Yes   Patient/family informed of bed offers received.  Patient chooses bed at [facility] recommends and patient chooses bed at      Patient to be transferred to [facility] on  .  Patient to be transferred to facility by [facility]     Patient family notified on   of transfer.  Name of family member notified:        PHYSICIAN Please prepare priority discharge summary, including medications, Please prepare  prescriptions, Please sign FL2, Please sign DNR     Additional Comment:    _______________________________________________ Alla German, LCSW 05/13/2016, 11:20 AM

## 2016-05-13 NOTE — Progress Notes (Signed)
Opened in error

## 2016-05-13 NOTE — Clinical Social Work Note (Addendum)
Clinical Social Work Assessment  Patient Details  Name: Lance [name] MRN: [id number] Date of Birth: 04/10/1941  Date of referral:  05/13/16               Reason for consult:  Discharge Planning, Facility Placement                Permission sought to share information with:  Facility Sport and exercise psychologist, Family Supports Permission granted to share information::     Name::     Bethena Roys and Mining engineer::  [facility]  Relationship::     Contact Information:     Housing/Transportation Living arrangements for the past 2 months:  [place] of Information:  Patient, Spouse Patient Interpreter Needed:  None Criminal Activity/Legal Involvement Pertinent to Current Situation/Hospitalization:  No - Comment as needed Significant Relationships:  Adult Children, Spouse Lives with:  Spouse Do you feel safe going back to the place where you live?  Yes Need for family participation in patient care:  Yes (Comment)  Care giving concerns:  The patient and family agree with return to [facility] as the patient is not able to be managed at home in his current condition.   Social Worker assessment / plan:  CSW met with patient and wife at bedside to complete assessment. Patient's son was also present at bedside. The patient states that he was admitted from [facility]. The patient and wife are happy with the care the patient has received at [facility] and plan for the patient to return at time of discharge. CSW has contacted admissions with [facility] and confirmed that the patient will be able to return at time of discharge. CSW will follow.  Employment status:  Disabled (Comment on whether or not currently receiving Disability), Retired Forensic scientist:  Medicare PT Recommendations:  [place] / Referral to community resources:  [place]  Patient/Family's Response to care:  The patient and family state they are  happy with the care the patient is receiving at this time. Both express appreciation for CSW involvement with discharge planning.   Patient/Family's Understanding of and Emotional Response to Diagnosis, Current Treatment, and Prognosis:  The patient and family have a good understanding of the reason for the patient's admission. The patient is coping well with hospitalization. Patient and family are hopefull the patient will be able to return home after rehab.  Emotional Assessment Appearance:  Appears stated age Attitude/Demeanor/Rapport:  Other (The patient and family are appropriate and welcoming of CSW.) Affect (typically observed):  Accepting, Appropriate, Calm, Pleasant Orientation:  Oriented to Self, Oriented to Place, Oriented to  Time, Oriented to Situation Alcohol / Substance use:  Not Applicable Psych involvement (Current and /or in the community):  No (Comment)  Discharge Needs  Concerns to be addressed:  Discharge Planning Concerns, Care Coordination Readmission within the last 30 days:  No Current discharge risk:  Physical Impairment Barriers to Discharge:  Continued Medical Work up   Rigoberto Noel, LCSW 05/13/2016, 8:36 AM

## 2016-05-13 NOTE — NC FL2 (Signed)
[place] MEDICAID FL2 LEVEL OF CARE SCREENING TOOL     IDENTIFICATION  Patient Name: Lance [name] Birthdate: 05-Dec-1940 Sex: male Admission Date (Current Location): 05/09/2016  [facility] and [place] Number:  Herbalist and Address:  [facility] [place], [place] [address] [place], [place] [address]      Provider Number: [id number]  Attending Physician Name and Address:  Robbie Lis, MD  Relative Name and Phone Number:       Current Level of Care: Hospital Recommended Level of Care: [employer] Prior Approval Number:    Date Approved/Denied:   PASRR Number: [id number]  Discharge Plan: SNF    Current Diagnoses: Patient Active Problem List   Diagnosis Date Noted  . Respiratory failure ([place]) 05/09/2016  . SIRS (systemic inflammatory response syndrome) ([place]) 05/09/2016  . Melena 05/09/2016  . Hematemesis 05/09/2016  . Altered mental status 04/21/2016  . Acute respiratory failure ([place])   . Acute renal failure with acute tubular necrosis superimposed on stage 3 chronic kidney disease ([place])   . NICM (nonischemic cardiomyopathy) ([place])   . Impaired functional mobility, balance, and endurance 02/17/2016  . Generalized anxiety disorder 02/17/2016  . Cardiomyopathy ([place]) 02/17/2016  . Hypoxia   . Acute pulmonary edema (HCC)   . Elevated troponin 02/14/2016  . Severe protein-calorie malnutrition ([place]) 02/14/2016  . Acute systolic heart failure ([place]) 02/14/2016  . Depression 02/13/2016  . AKI (acute kidney injury) ([place]) 01/30/2016  . DM (diabetes mellitus) type II controlled with renal manifestation ([place]) 09/06/2015  . Dyslipidemia associated with type 2 diabetes mellitus ([place]) 09/06/2015  . Recurrent UTI (urinary tract infection) 09/06/2015  . Decubitus ulcer of right ischial area 06/14/2015  . Decubitus ulcer of trochanter, stage 4 ([place]) 06/14/2015  . Left perineal ischial pressure ulcer   . Acute osteomyelitis, pelvis, right  ([place])   . Urinary tract infection   . Chronic kidney disease, stage 3 08/03/2013  . Essential hypertension, benign 08/03/2013  . Cellulitis of foot 08/02/2013  . Spastic quadriplegia ([place]) 07/10/2011  . Multiple sclerosis ([place]) 03/30/2011  . Sacral decubitus ulcer 03/30/2011  . Normocytic anemia 03/30/2011    Orientation RESPIRATION BLADDER Height & Weight     Self, Time, Situation, Place  Normal Continent Weight: 71 kg (156 lb 8.4 oz) Height:  5\' 10"  (177.8 cm)  BEHAVIORAL SYMPTOMS/MOOD NEUROLOGICAL BOWEL NUTRITION STATUS   (NONE)  (NONE) Incontinent Diet (Regular)  AMBULATORY STATUS COMMUNICATION OF NEEDS Skin   Extensive Assist Verbally PU Stage and Appropriate Care, Other (Comment) (Unstageable wound of right buttocks.)   PU Stage 2 Dressing:  (Stage II of left sacrum) PU Stage 3 Dressing:  (Stage III of right sacrum)                 Personal Care Assistance Level of Assistance  Bathing, Dressing, Feeding Bathing Assistance: Maximum assistance Feeding assistance: Independent Dressing Assistance: Maximum assistance     Functional Limitations Info  Sight, Hearing, Speech Sight Info: Adequate Hearing Info: Adequate Speech Info: Adequate    SPECIAL CARE FACTORS FREQUENCY  PT (By licensed PT), OT (By licensed OT) (Patient on MWF HD Schedule)     PT Frequency: 5/week OT Frequency: 5/week            Contractures Contractures Info: Not present    Additional Factors Info  Code Status, Allergies, Psychotropic, Insulin Sliding Scale, Isolation Precautions Code Status Info: DNR Allergies Info: NKDA Psychotropic Info: Zoloft Insulin Sliding Scale Info: 6/day. Isolation  Precautions Info: ESBL, MRSA     Current Medications (05/13/2016):  This is the current hospital active medication list Current Facility-Administered Medications  Medication Dose Route Frequency Provider Last Rate Last Dose  . 0.9 %  sodium chloride infusion  100 mL Intravenous PRN Roney Jaffe,  MD      . 0.9 %  sodium chloride infusion  100 mL Intravenous PRN Roney Jaffe, MD      . acetaminophen (TYLENOL) tablet 650 mg  650 mg Oral Q6H PRN Elwin Mocha, MD   650 mg at 05/09/16 2014   Or  . acetaminophen (TYLENOL) suppository 650 mg  650 mg Rectal Q6H PRN Elwin Mocha, MD      . alteplase (CATHFLO ACTIVASE) injection 2 mg  2 mg Intracatheter Once PRN Roney Jaffe, MD      . carvedilol (COREG) tablet 3.125 mg  3.125 mg Oral BID WC Robbie Lis, MD   3.125 mg at 05/12/16 1759  . Chlorhexidine Gluconate Cloth 2 % PADS 6 each  6 each Topical Q0600 Robbie Lis, MD   6 each at 05/13/16 0510  . dextromethorphan-guaiFENesin (MUCINEX DM) 30-600 MG per 12 hr tablet 1 tablet  1 tablet Oral BID Robbie Lis, MD   1 tablet at 05/12/16 [phone]  . feeding supplement (NEPRO CARB STEADY) liquid 237 mL  237 mL Oral TID BM Robbie Lis, MD   237 mL at 05/12/16 2004  . ferric gluconate (NULECIT) 62.5 mg in sodium chloride 0.9 % 100 mL IVPB  62.5 mg Intravenous Q Wed-HD Ernest Haber, PA-C      . heparin injection 1,000 Units  1,000 Units Dialysis PRN Roney Jaffe, MD      . Derrill Memo ON 05/14/2016] heparin injection 2,300 Units  2,300 Units Dialysis Once in dialysis Roney Jaffe, MD      . insulin aspart (novoLOG) injection 0-15 Units  0-15 Units Subcutaneous Q4H Elwin Mocha, MD   3 Units at 05/13/16 0800  . lidocaine (PF) (XYLOCAINE) 1 % injection 5 mL  5 mL Intradermal PRN Roney Jaffe, MD      . lidocaine-prilocaine (EMLA) cream 1 application  1 application Topical PRN Roney Jaffe, MD      . MEDLINE mouth rinse  15 mL Mouth Rinse BID Robbie Lis, MD   15 mL at 05/12/16 2226  . Melatonin TABS 3 mg  3 mg Oral QHS Elwin Mocha, MD   3 mg at 05/12/16 2211  . meropenem (MERREM) 500 mg in sodium chloride 0.9 % 50 mL IVPB  500 mg Intravenous Q2000 Kimberly B Hammons, RPH   500 mg at 05/12/16 1432  . multivitamin (RENA-VIT) tablet 1 tablet  1 tablet Oral QHS Roney Jaffe, MD   1  tablet at 05/11/16 2130  . mupirocin ointment (BACTROBAN) 2 % 1 application  1 application Nasal BID Robbie Lis, MD   1 application at 13/08/65 [phone]  . ondansetron (ZOFRAN) tablet 4 mg  4 mg Oral Q6H PRN Elwin Mocha, MD       Or  . ondansetron [facility]) injection 4 mg  4 mg Intravenous Q6H PRN Elwin Mocha, MD      . oxybutynin [facility]) tablet 5 mg  5 mg Oral BID Elwin Mocha, MD   5 mg at 05/13/16 0802  . pantoprazole (PROTONIX) injection 40 mg  40 mg Intravenous Q12H Elwin Mocha, MD   40 mg at 05/12/16 2211  . pentafluoroprop-tetrafluoroeth (GEBAUERS)  aerosol 1 application  1 application Topical PRN Roney Jaffe, MD      . saccharomyces boulardii [facility]) capsule 250 mg  250 mg Oral BID Elwin Mocha, MD   250 mg at 05/12/16 2210  . sertraline (ZOLOFT) tablet 50 mg  50 mg Oral Daily Elwin Mocha, MD   50 mg at 05/13/16 0802  . vancomycin (VANCOCIN) IVPB 750 mg/150 ml premix  750 mg Intravenous Q M,W,F-HD Kimberly B Hammons, RPH      . vitamin C (ASCORBIC ACID) tablet 1,000 mg  1,000 mg Oral Daily Roney Jaffe, MD   1,000 mg at 05/12/16 0829  . zinc sulfate capsule 220 mg  220 mg Oral Daily Roney Jaffe, MD   220 mg at 05/12/16 2820     Discharge Medications: Please see discharge summary for a list of discharge medications.  Relevant Imaging Results:  Relevant Lab Results:   Additional Information SSN: [id number]  Rigoberto Noel, LCSW

## 2016-05-14 DIAGNOSIS — A4902 Methicillin resistant Staphylococcus aureus infection, unspecified site: Secondary | ICD-10-CM

## 2016-05-14 DIAGNOSIS — E876 Hypokalemia: Secondary | ICD-10-CM

## 2016-05-14 LAB — CBC
HEMATOCRIT: 25.1 % — AB (ref 39.0–52.0)
Hemoglobin: 7.9 g/dL — ABNORMAL LOW (ref 13.0–17.0)
MCH: 30.7 pg (ref 26.0–34.0)
MCHC: 31.5 g/dL (ref 30.0–36.0)
MCV: 97.7 fL (ref 78.0–100.0)
PLATELETS: 256 10*3/uL (ref 150–400)
RBC: 2.57 MIL/uL — AB (ref 4.22–5.81)
RDW: 16.6 % — ABNORMAL HIGH (ref 11.5–15.5)
WBC: 12.1 10*3/uL — AB (ref 4.0–10.5)

## 2016-05-14 LAB — GLUCOSE, CAPILLARY
GLUCOSE-CAPILLARY: 155 mg/dL — AB (ref 65–99)
GLUCOSE-CAPILLARY: 150 mg/dL — AB (ref 65–99)
GLUCOSE-CAPILLARY: 136 mg/dL — AB (ref 65–99)
Glucose-Capillary: 115 mg/dL — ABNORMAL HIGH (ref 65–99)
Glucose-Capillary: 133 mg/dL — ABNORMAL HIGH (ref 65–99)
Glucose-Capillary: 194 mg/dL — ABNORMAL HIGH (ref 65–99)

## 2016-05-14 LAB — BASIC METABOLIC PANEL
Anion gap: 11 (ref 5–15)
BUN: 21 mg/dL — ABNORMAL HIGH (ref 6–20)
CO2: 26 mmol/L (ref 22–32)
Calcium: 8.7 mg/dL — ABNORMAL LOW (ref 8.9–10.3)
Chloride: 100 mmol/L — ABNORMAL LOW (ref 101–111)
Creatinine, Ser: 2.86 mg/dL — ABNORMAL HIGH (ref 0.61–1.24)
GFR calc Af Amer: 23 mL/min — ABNORMAL LOW (ref >60)
GFR calc non Af Amer: 20 mL/min — ABNORMAL LOW (ref >60)
Glucose, Bld: 145 mg/dL — ABNORMAL HIGH (ref 65–99)
Potassium: 3.8 mmol/L (ref 3.5–5.1)
Sodium: 137 mmol/L (ref 135–145)

## 2016-05-14 LAB — CULTURE, BLOOD (ROUTINE X 2)
CULTURE: NO GROWTH
Culture: NO GROWTH

## 2016-05-14 NOTE — Progress Notes (Signed)
Patient ID: Lance [name], male   [name]: Apr 19, 1941, 75 y.o.   MRN: [id number]    PROGRESS NOTE    Morad Tal Sapp  WUJ:[id number] [name]: August 01, 1940 DOA: 05/09/2016  PCP: [name], MD   Brief Narrative:  76 y.o. male  with past mental history significant for congestive heart failure with last 2 D ECHO in 02/2016 with EF 35% and mild myxomatous degeneration, end-stage renal disease, recurrent UTI, MS, from [facility] who presented to ED with vomiting, diarrhea, shortness of breath. His O2 sat was 77%at the time EMS arrived. The physician at the SNF was working him up for urinary tract infection. He had not been given any antibiotics. Patient reported that he vomited blood.  Patient started on vancomycin and cefepime on admission. Patient placed on BiPAP due to hypoxia. Patient had a bloody melanotic stool. Patient also appeared to have blood on UA. Report given that patient was on heparin at his facility to prevent DVTs. INR was normal and initial lactic acid was normal.  Assessment & Plan:   Principal Problem: Sepsis secondary to left lower lobe pneumonia and Klebsiella and MRSA UTI / Leukocytosis - Sepsis criteria met on admission with fever, tachycardia, tachypnea, hypotension, hypoxia, leukocytosis - Source of infection likely Klebsiella and MRSA UTI as well as pneumonia - CXR showed left basilar airspace opacity, likely within the left lower lobe - Urine culture is growing Klebsiella pneumonia and MRSA - We will continue vanco and meropenem for now  - Blood cx negative   Active Problems: Hematemesis - Resolved  - Hemoglobin 7.9 this am  - Continue protonix 40 mg Q 12 hours   Hypokalemia - Possibly from lasix - Supplemented and WNL  End stage renal disease on HD - HD MWF - Management per renal  Anemia of chronic disease - Due to ESRD - Hemoglobin 7.9 - Continue ferric gluconate with HD  DM (diabetes mellitus) type II controlled with renal manifestation  with long term insulin use (HCC) - Continue sliding scale insulin  Depression - Continue Zoloft   Essential hypertension - Continue carvedilol 3.125 mg BID  Stage 4 right ischium pressure ulcer / stage 3 left ischium pressure ulcer - Appreciate WOC assessment  - Stage 4 Pressure injury; right ischium; Stage 3 Pressure injury left ischium - Measurement: right ischium: 4cm x 3.5cm x 0.5cm with undermining from 11-2 o'clock that is 4.0cm  - Left ischial wound: 2cm x 2cm x 0.2cm - Dressing procedure/placement/frequency: Saline moistened gauze to the right ischial wound, change BID. Hydrogel to the left ischial wound, change daily. Low air loss mattress in place for pressure redistribution. Prevalon boots in place to protect and float heels.  DVT prophylaxis: SCD's bilaterally  Code Status: DNR/DNI Family Communication: family not at the bedside this am; called his wife and daughter this am separately to give update   Disposition Plan: not yet stable for discharge, likely by 05/18/2016   Consultants:   Nephrology   PT  [place]  Procedures:   None  Antimicrobials:   Cefepime 05/09/2016 --> 05/12/2016  Meropenem 05/12/2016 -->  Vanco 05/09/2016 -->   Subjective: No overnight events.   Objective: Vitals:   05/14/16 0029 05/14/16 0349 05/14/16 0729 05/14/16 0932  BP: (!) 124/57 (!) 132/58 (!) 147/54 (!) 146/60  Pulse: 86 88 91 89  Resp: (!) 22 (!) 28 (!) 24 11  Temp: 98.7 F (37.1 C) 99.3 F (37.4 C) 98.5 F (36.9 C) 98.3 F (36.8 C)  TempSrc: Oral Oral  Oral Oral  SpO2: 95% 93% 95% 95%  Weight:  70 kg (154 lb 5.2 oz)    Height:        Intake/Output Summary (Last 24 hours) at 05/14/16 0944 Last data filed at 05/14/16 0600  Gross per 24 hour  Intake              560 ml  Output                0 ml  Net              560 ml   Filed Weights   05/11/16 1127 05/13/16 0450 05/14/16 0349  Weight: 74 kg (163 lb 2.3 oz) 71 kg (156 lb 8.4 oz) 70 kg (154 lb 5.2 oz)     Examination:  General exam: no distress, comfortable  Respiratory system: no wheezing, no rhonchi  Cardiovascular system: S1 & S2 heard, Rate controlled  Gastrointestinal system: (+) BS, no distention  Central nervous system: No focal deficits  Extremities: palpable pulses, no tenderness, has prevalon boots on  Skin: skin is warm and dry  Psychiatry: No agitation or restlessness   Data Reviewed: I have personally reviewed following labs and imaging studies  CBC:  Recent Labs Lab 05/09/16 1151  05/10/16 0410 05/11/16 0500 05/12/16 0615 05/13/16 0607 05/14/16 0541  WBC 9.5  --  11.9* 14.0* 13.9* 10.7* 12.1*  NEUTROABS 7.7  --   --   --   --   --   --   HGB 9.4*  < > 9.2* 8.2* 8.2* 8.0* 7.9*  HCT 29.8*  < > 28.7* 25.9* 25.6* 24.1* 25.1*  MCV 97.7  --  97.3 97.0 97.7 98.0 97.7  PLT 199  --  192 221 202 216 256  < > = values in this interval not displayed. Basic Metabolic Panel:  Recent Labs Lab 05/09/16 1151 05/10/16 0410 05/11/16 0500 05/12/16 0615 05/14/16 0541  NA 135 133* 135 133* 137  K 3.9 3.4* 3.1* 4.4 3.8  CL 93* 94* 95* 99* 100*  CO2 '26 25 28 23 26  ' GLUCOSE 117* 146* 130* 109* 145*  BUN 62* 18 41* 30* 21*  CREATININE 6.87* 2.95* 4.62* 3.02* 2.86*  CALCIUM 8.3* 7.3* 8.1* 8.4* 8.7*  MG  --   --   --  1.9  --    GFR: Estimated Creatinine Clearance: 22.1 mL/min (by C-G formula based on SCr of 2.86 mg/dL (H)). Liver Function Tests:  Recent Labs Lab 05/09/16 1151  AST 16  ALT 13*  ALKPHOS 67  BILITOT 0.8  PROT 7.3  ALBUMIN 2.1*   No results for input(s): LIPASE, AMYLASE in the last 168 hours. No results for input(s): AMMONIA in the last 168 hours. Coagulation Profile:  Recent Labs Lab 05/09/16 1509  INR 1.10   Cardiac Enzymes: No results for input(s): CKTOTAL, CKMB, CKMBINDEX, TROPONINI in the last 168 hours. BNP (last 3 results) No results for input(s): PROBNP in the last 8760 hours. HbA1C: No results for input(s): HGBA1C in the last  72 hours. CBG:  Recent Labs Lab 05/13/16 2104 05/13/16 2152 05/14/16 0031 05/14/16 0353 05/14/16 0726  GLUCAP 151* 161* 133* 115* 155*   Lipid Profile: No results for input(s): CHOL, HDL, LDLCALC, TRIG, CHOLHDL, LDLDIRECT in the last 72 hours. Thyroid Function Tests: No results for input(s): TSH, T4TOTAL, FREET4, T3FREE, THYROIDAB in the last 72 hours. Anemia Panel: No results for input(s): VITAMINB12, FOLATE, FERRITIN, TIBC, IRON, RETICCTPCT in the last 72 hours. Urine  analysis:    Component Value Date/Time   COLORURINE AMBER (A) 05/09/2016 1220   APPEARANCEUR CLOUDY (A) 05/09/2016 1220   LABSPEC 1.012 05/09/2016 1220   PHURINE 8.0 05/09/2016 1220   GLUCOSEU NEGATIVE 05/09/2016 1220   HGBUR LARGE (A) 05/09/2016 1220   BILIRUBINUR NEGATIVE 05/09/2016 1220   KETONESUR NEGATIVE 05/09/2016 1220   PROTEINUR 100 (A) 05/09/2016 1220   UROBILINOGEN 0.2 08/30/2014 2203   NITRITE NEGATIVE 05/09/2016 1220   LEUKOCYTESUR SMALL (A) 05/09/2016 1220   Sepsis Labs: '@[email or web address]' (procalcitonin:4,lacticidven:4)   Urine culture     Status: Abnormal (Preliminary result)   Collection Time: 05/09/16 12:20 PM  Result Value Ref Range Status   Specimen Description URINE, CLEAN CATCH  Final   Special Requests Normal  Final   Culture >=100,000 COLONIES/mL GRAM NEGATIVE RODS (A)  Final   Report Status PENDING  Incomplete  MRSA PCR Screening     Status: Abnormal   Collection Time: 05/09/16  8:06 PM  Result Value Ref Range Status   MRSA by PCR POSITIVE (A) NEGATIVE Final      Radiology Studies: US Renal Result Date: 05/09/2016  Increased renal echogenicity bilaterally is indicative of medical renal disease. No hydronephrosis seen on either side. There is a complex cystic mass arising from the upper pole right kidney. There is an area of apparent nodularity within this cyst, raising concern for potential cystic neoplasm. Further evaluation with pre and post contrast MRI or CT nonemergently should  be considered. MRI is preferred in younger patients (due to lack of ionizing radiation) and for evaluating calcified lesion(s). Electronically Signed   By: Lowella Grip III M.D.   On: 05/09/2016 21:53   Dg Chest Port 1 View Result Date: 05/10/2016 Stable left basilar changes.   Dg Chest Port 1 View Result Date: 05/09/2016 1. Nonspecific left basilar airspace opacity, likely within the left lower lobe. Differential considerations include pleural fluid with atelectasis and/or infiltrate. Consider PA and lateral chest x-ray for further differentiation. 2. Slightly increased pulmonary vascular congestion now with mild interstitial edema. 3. Stable cardiomegaly. 4. Stable right IJ approach tunneled hemodialysis catheter.    Scheduled Meds: . carvedilol  3.125 mg Oral BID WC  . Chlorhexidine Gluconate Cloth  6 each Topical Q0600  . dextromethorphan-guaiFENesin  1 tablet Oral BID  . feeding supplement (NEPRO CARB STEADY)  237 mL Oral TID BM  . ferric gluconate (FERRLECIT/NULECIT) IV  62.5 mg Intravenous Q Wed-HD  . insulin aspart  0-15 Units Subcutaneous Q4H  . mouth rinse  15 mL Mouth Rinse BID  . Melatonin  3 mg Oral QHS  . meropenem (MERREM) IV  500 mg Intravenous Q2000  . multivitamin  1 tablet Oral QHS  . mupirocin ointment  1 application Nasal BID  . oxybutynin  5 mg Oral BID  . pantoprazole  40 mg Oral BID  . saccharomyces boulardii  250 mg Oral BID  . sertraline  50 mg Oral Daily  . vancomycin  750 mg Intravenous Q M,W,F-HD  . vitamin C  1,000 mg Oral Daily  . zinc sulfate  220 mg Oral Daily   Continuous Infusions:   LOS: 5 days    Time spent: 15 minutes  Greater than 50% of the time spent on counseling and coordinating the care.   Leisa Lenz, MD [facility] Pager [phone]  If 7PM-7AM, please contact night-coverage [email or web address] Password [facility] 05/14/2016, 9:44 AM

## 2016-05-14 NOTE — Progress Notes (Signed)
[facility] Progress Note   Subjective: no new c/o  Vitals:   05/14/16 0349 05/14/16 0729 05/14/16 0932 05/14/16 1113  BP: (!) 132/58 (!) 147/54 (!) 146/60 (!) 139/51  Pulse: 88 91 89 85  Resp: (!) 28 (!) 24 11 (!) 25  Temp: 99.3 F (37.4 C) 98.5 F (36.9 C) 98.3 F (36.8 C) 98.2 F (36.8 C)  TempSrc: Oral Oral Oral Oral  SpO2: 93% 95% 95% 96%  Weight: 70 kg (154 lb 5.2 oz)     Height:        Inpatient medications: . carvedilol  3.125 mg Oral BID WC  . Chlorhexidine Gluconate Cloth  6 each Topical Q0600  . dextromethorphan-guaiFENesin  1 tablet Oral BID  . feeding supplement (NEPRO CARB STEADY)  237 mL Oral TID BM  . ferric gluconate (FERRLECIT/NULECIT) IV  62.5 mg Intravenous Q Wed-HD  . insulin aspart  0-15 Units Subcutaneous Q4H  . mouth rinse  15 mL Mouth Rinse BID  . Melatonin  3 mg Oral QHS  . meropenem (MERREM) IV  500 mg Intravenous Q2000  . multivitamin  1 tablet Oral QHS  . mupirocin ointment  1 application Nasal BID  . oxybutynin  5 mg Oral BID  . pantoprazole  40 mg Oral BID  . saccharomyces boulardii  250 mg Oral BID  . sertraline  50 mg Oral Daily  . vancomycin  750 mg Intravenous Q M,W,F-HD  . vitamin C  1,000 mg Oral Daily  . zinc sulfate  220 mg Oral Daily    acetaminophen **OR** acetaminophen, ondansetron **OR** ondansetron (ZOFRAN) IV  Exam: [name], no distress, on HD No jvd Chest dec'd BS bilat RRR no mrg Abd soft ntnd w foley in place Ext bilat protective boots, R foot contracted R IJ cath/ R FA AVF+bruit  Dialysis: [facility]    3.5h   76kg    2/2 bath  Hep 2300   RFA AVF (03/24/16)/ R IJ cath  - Mirc 100 last 1/03  - Venofer 50/wk Labs: pth 35 P 3.5 Ca 11.7  Hb 9.5      Assessment: 1. Hypoxia/ SOB/ PNA /UTI - improving fevers, on abx, urine cx+GNR 2. ESRD on HD mwf, maturing AVF 3. MS/ quadriplegic/ NGB - indwelling foley 4. HTN - bp's better off of coreg 5. Anemia next esa 1/17 6. Melena - resolved, no  hep for now w hd 7. Sacral decub - per primary 8. 2HPTH - no chg meds 9. DM on insulin 10. Vol at dry wt 11. DNR  Plan - HD Friday, poss back to SNF over weekend per pt   [name] MD [place] pager [phone]   05/14/2016, 1:43 PM    Recent Labs Lab 05/11/16 0500 05/12/16 0615 05/14/16 0541  NA 135 133* 137  K 3.1* 4.4 3.8  CL 95* 99* 100*  CO2 28 23 26   GLUCOSE 130* 109* 145*  BUN 41* 30* 21*  CREATININE 4.62* 3.02* 2.86*  CALCIUM 8.1* 8.4* 8.7*    Recent Labs Lab 05/09/16 1151  AST 16  ALT 13*  ALKPHOS 67  BILITOT 0.8  PROT 7.3  ALBUMIN 2.1*    Recent Labs Lab 05/09/16 1151  05/12/16 0615 05/13/16 0607 05/14/16 0541  WBC 9.5  < > 13.9* 10.7* 12.1*  NEUTROABS 7.7  --   --   --   --   HGB 9.4*  < > 8.2* 8.0* 7.9*  HCT 29.8*  < >  25.6* 24.1* 25.1*  MCV 97.7  < > 97.7 98.0 97.7  PLT 199  < > 202 216 256  < > = values in this interval not displayed. Iron/TIBC/Ferritin/ %Sat    Component Value Date/Time   IRON 28 (L) 02/15/2016 0532   TIBC 193 (L) 02/15/2016 0532   FERRITIN 316 08/31/2014 0036   IRONPCTSAT 14 (L) 02/15/2016 0532

## 2016-05-15 DIAGNOSIS — R112 Nausea with vomiting, unspecified: Secondary | ICD-10-CM

## 2016-05-15 LAB — RENAL FUNCTION PANEL
Albumin: 1.7 g/dL — ABNORMAL LOW (ref 3.5–5.0)
Anion gap: 11 (ref 5–15)
BUN: 9 mg/dL (ref 6–20)
CO2: 27 mmol/L (ref 22–32)
Calcium: 8.1 mg/dL — ABNORMAL LOW (ref 8.9–10.3)
Chloride: 96 mmol/L — ABNORMAL LOW (ref 101–111)
Creatinine, Ser: 1.67 mg/dL — ABNORMAL HIGH (ref 0.61–1.24)
GFR calc Af Amer: 45 mL/min — ABNORMAL LOW
GFR calc non Af Amer: 38 mL/min — ABNORMAL LOW
Glucose, Bld: 156 mg/dL — ABNORMAL HIGH (ref 65–99)
Phosphorus: 1.5 mg/dL — ABNORMAL LOW (ref 2.5–4.6)
Potassium: 4 mmol/L (ref 3.5–5.1)
Sodium: 134 mmol/L — ABNORMAL LOW (ref 135–145)

## 2016-05-15 LAB — CBC
HCT: 24.7 % — ABNORMAL LOW (ref 39.0–52.0)
Hemoglobin: 8 g/dL — ABNORMAL LOW (ref 13.0–17.0)
MCH: 30.9 pg (ref 26.0–34.0)
MCHC: 32.4 g/dL (ref 30.0–36.0)
MCV: 95.4 fL (ref 78.0–100.0)
Platelets: 278 K/uL (ref 150–400)
RBC: 2.59 MIL/uL — ABNORMAL LOW (ref 4.22–5.81)
RDW: 16.4 % — ABNORMAL HIGH (ref 11.5–15.5)
WBC: 8.1 K/uL (ref 4.0–10.5)

## 2016-05-15 LAB — GLUCOSE, CAPILLARY
GLUCOSE-CAPILLARY: 102 mg/dL — AB (ref 65–99)
GLUCOSE-CAPILLARY: 176 mg/dL — AB (ref 65–99)
Glucose-Capillary: 117 mg/dL — ABNORMAL HIGH (ref 65–99)
Glucose-Capillary: 133 mg/dL — ABNORMAL HIGH (ref 65–99)
Glucose-Capillary: 87 mg/dL (ref 65–99)
Glucose-Capillary: 128 mg/dL — ABNORMAL HIGH (ref 65–99)

## 2016-05-15 MED ORDER — ALTEPLASE 2 MG IJ SOLR: 2.0000 mg | Freq: Once | INTRAMUSCULAR | Status: DC | PRN

## 2016-05-15 MED ORDER — HEPARIN SODIUM (PORCINE) 1000 UNIT/ML DIALYSIS: 2300.0000 [IU] | Freq: Once | INTRAMUSCULAR | Status: DC

## 2016-05-15 MED ORDER — HEPARIN SODIUM (PORCINE) 1000 UNIT/ML DIALYSIS: 1000.0000 [IU] | INTRAMUSCULAR | Status: DC | PRN

## 2016-05-15 MED ORDER — PENTAFLUOROPROP-TETRAFLUOROETH EX AERO: 1.0000 "application " | INHALATION_SPRAY | CUTANEOUS | Status: DC | PRN

## 2016-05-15 MED ORDER — SODIUM CHLORIDE 0.9 % IV SOLN: 100.0000 mL | INTRAVENOUS | Status: DC | PRN

## 2016-05-15 MED ORDER — LIDOCAINE-PRILOCAINE 2.5-2.5 % EX CREA: 1.0000 "application " | TOPICAL_CREAM | CUTANEOUS | Status: DC | PRN

## 2016-05-15 MED ORDER — LIDOCAINE HCL (PF) 1 % IJ SOLN: 5.0000 mL | INTRAMUSCULAR | Status: DC | PRN

## 2016-05-15 NOTE — Progress Notes (Signed)
Pharmacy Antibiotic Note  Lance [name] is a 76 y.o. male admitted on 05/09/2016 with sepsis and UTI.  Pharmacy has been consulted for Vancomycin and Merropenem dosing - vancomycin D#7, meropenem day #4.  Antibiotics changed based on urine cx results/resistance. Afebrile, WBC up 12.1  Unfortunately all Carbapenems are dosed q24h in ESRD patients and cannot be solely scheduled with HD outpatient.  Plan: -Merrem 500mg  IV q24h @ 2000 to be after HD -Vancomycin 750mg  IV qHD-MWF - d/c soon? -F/u c/s, clinical progression, HD schedule/tolerance, LOT  Height: 5\' 10"  (177.8 cm) Weight: 152 lb 1.9 oz (69 kg) IBW/kg (Calculated) : 73  Temp (24hrs), Avg:98.2 F (36.8 C), Min:97.7 F (36.5 C), Max:98.9 F (37.2 C)   Recent Labs Lab 05/09/16 1151 05/09/16 1216 05/09/16 1534 05/10/16 0410 05/11/16 0500 05/12/16 0615 05/13/16 0607 05/14/16 0541  WBC 9.5  --   --  11.9* 14.0* 13.9* 10.7* 12.1*  CREATININE 6.87*  --   --  2.95* 4.62* 3.02*  --  2.86*  LATICACIDVEN  --  1.46 1.49  --   --   --   --   --     Estimated Creatinine Clearance: 21.8 mL/min (by C-G formula based on SCr of 2.86 mg/dL (H)).    Allergies  Allergen Reactions  . No Known Allergies     Antimicrobials this admission: Cefepime 1/6>> 1/9 Vancomycin 1/6>> 1/8, restart 1/9>> Merrem 1/9 >>  Dose adjustments this admission: 1/8- cefepime changed to 2q qHD day as back on schedule  Microbiology results: 1/6 BCx: neg 1/6 UCx: 100K kleb pneumo (S: Primax, Gent) and 100K MRSA 1/6 MRSA PCR: Positive    Elicia Lamp, PharmD, BCPS Clinical Pharmacist 05/15/2016 11:00 AM

## 2016-05-15 NOTE — Progress Notes (Signed)
PROGRESS NOTE    Lance [name]  CVK:[id number] [name]: October 12, 1940 DOA: 05/09/2016 PCP: [name], MD   Brief Narrative: 76 y.o.malewith past mental history significant for congestive heart failure with last 2 D ECHO in 02/2016 with EF 35% and mild myxomatous degeneration, end-stage renal disease, recurrent UTI, MS, from [facility] who presented to ED with vomiting, diarrhea, shortness of breath. His O2 sat was 77%at the time EMS arrived. The physician at the SNF was working him up for urinary tract infection. He had not been given any antibiotics. Patient reported that he vomited blood. Patient started on vancomycin and cefepime on admission. Patient placed on BiPAP due to hypoxia. Patient had a bloody melanotic stool. Patient also appeared to have blood on UA. Report given that patient was on heparin at his facility to prevent DVTs. INR was normal and initial lactic acid was normal.  Assessment & Plan:  # Sepsis secondary to left lower lobe pneumonia and Klebsiella and MRSA UTI / Leukocytosis - Sepsis criteria met on admission with fever, tachycardia, tachypnea, hypotension, hypoxia, leukocytosis - Source of infection likely Klebsiella and MRSA UTI as well as pneumonia - CXR showed left basilar airspace opacity, likely within the left lower lobe - Currently on vancomycin day 7 and meropenem day for. - Blood cultures negative so far.   # Hematemesis - resolved now, h/h stable today. Monitor CBC.   - Continue protonix 40 mg Q 12 hours  -Patient with nausea vomiting and abdominal pain today. Continue supportive care. Abdomen exam is nontender.  # End stage renal disease on HD - HD MWF - Management per renal. Hemodialysis today.  # Anemia of chronic disease and possibly due to hematemesis. Monitor CBC. On IV iron during dialysis.  # DM (diabetes mellitus) type II controlled with renal manifestation with long term insulin use (HCC) - Continue sliding scale insulin.  Encourage oral intake.  # Depression - Continue Zoloft   # Essential hypertension: Blood pressure acceptable. - Continue carvedilol 3.125 mg BID  #Stage 4 right ischium pressure ulcer / stage 3 left ischium pressure ulcer: -Wound care consult assessment appreciated. Continue dressing change. Patient will benefit from outpatient wound care evaluation and treatment.  #As per medical record, patient with history of MS, quadriplegic and chronic indwelling Foley catheter: PT, OT treatment and possibly discharge to SNF.  Principal Problem:   Respiratory failure (HCC) Active Problems:   Sacral decubitus ulcer   DM (diabetes mellitus) type II controlled with renal manifestation (HCC)   Depression   SIRS (systemic inflammatory response syndrome) (HCC)   Melena   Hematemesis  DVT prophylaxis: Heparin subcutaneous. Code Status: DO NOT RESUSCITATE Family Communication: No family present at bedside Disposition Plan: Likely discharge back to SNF over the weekend if patient's symptoms improved.  Consultants:   Nephrologist  Procedures: None Antimicrobials: Vancomycin and meropenem.  Subjective: Patient was seen and examined at bedside. Patient reported weakness for the Nausea vomiting and abdominal discomfort. Denied chest pain, shortness of breath, headache or dizziness.   Objective: Vitals:   05/15/16 0030 05/15/16 0429 05/15/16 0721 05/15/16 1053  BP: (!) 140/58 131/63 (!) 138/56 135/64  Pulse: 77 81 78 88  Resp: (!) 29 (!) 27 (!) 28 15  Temp: 97.7 F (36.5 C) 98.9 F (37.2 C) 98 F (36.7 C) 98.4 F (36.9 C)  TempSrc: Oral Oral Oral Oral  SpO2: 95% 95% 95% 95%  Weight:  69 kg (152 lb 1.9 oz)    Height:  Intake/Output Summary (Last 24 hours) at 05/15/16 1343 Last data filed at 05/15/16 0300  Gross per 24 hour  Intake              340 ml  Output                0 ml  Net              340 ml   Filed Weights   05/13/16 0450 05/14/16 0349 05/15/16 0429  Weight:  71 kg (156 lb 8.4 oz) 70 kg (154 lb 5.2 oz) 69 kg (152 lb 1.9 oz)    Examination:  General exam: Ill-looking male lying on bed, not in distress  Respiratory system: Clear to auscultation. Respiratory effort normal. No wheezing or crackle Cardiovascular system: S1 & S2 heard, RRR.  No pedal edema. Gastrointestinal system: Abdomen is nondistended, soft and nontender. Normal bowel sounds heard. Central nervous system: Alert awake and following commands. Skin: No rashes, lesions or ulcers Psychiatry: Judgement and insight appear normal. Mood & affect appropriate.     Data Reviewed: I have personally reviewed following labs and imaging studies  CBC:  Recent Labs Lab 05/09/16 1151  05/10/16 0410 05/11/16 0500 05/12/16 0615 05/13/16 0607 05/14/16 0541  WBC 9.5  --  11.9* 14.0* 13.9* 10.7* 12.1*  NEUTROABS 7.7  --   --   --   --   --   --   HGB 9.4*  < > 9.2* 8.2* 8.2* 8.0* 7.9*  HCT 29.8*  < > 28.7* 25.9* 25.6* 24.1* 25.1*  MCV 97.7  --  97.3 97.0 97.7 98.0 97.7  PLT 199  --  192 221 202 216 256  < > = values in this interval not displayed. Basic Metabolic Panel:  Recent Labs Lab 05/09/16 1151 05/10/16 0410 05/11/16 0500 05/12/16 0615 05/14/16 0541  NA 135 133* 135 133* 137  K 3.9 3.4* 3.1* 4.4 3.8  CL 93* 94* 95* 99* 100*  CO2 _0 GLUCOSE 117* 146* 130* 109* 145*  BUN 62* 18 41* 30* 21*  CREATININE 6.87* 2.95* 4.62* 3.02* 2.86*  CALCIUM 8.3* 7.3* 8.1* 8.4* 8.7*  MG  --   --   --  1.9  --    GFR: Estimated Creatinine Clearance: 21.8 mL/min (by C-G formula based on SCr of 2.86 mg/dL (H)). Liver Function Tests:  Recent Labs Lab 05/09/16 1151  AST 16  ALT 13*  ALKPHOS 67  BILITOT 0.8  PROT 7.3  ALBUMIN 2.1*   No results for input(s): LIPASE, AMYLASE in the last 168 hours. No results for input(s): AMMONIA in the last 168 hours. Coagulation Profile:  Recent Labs Lab 05/09/16 1509  INR 1.10   Cardiac Enzymes: No results for input(s):  CKTOTAL, CKMB, CKMBINDEX, TROPONINI in the last 168 hours. BNP (last 3 results) No results for input(s): PROBNP in the last 8760 hours. HbA1C: No results for input(s): HGBA1C in the last 72 hours. CBG:  Recent Labs Lab 05/14/16 2044 05/15/16 0101 05/15/16 0426 05/15/16 0719 05/15/16 1052  GLUCAP 136* 117* 133* 87 128*   Lipid Profile: No results for input(s): CHOL, HDL, LDLCALC, TRIG, CHOLHDL, LDLDIRECT in the last 72 hours. Thyroid Function Tests: No results for input(s): TSH, T4TOTAL, FREET4, T3FREE, THYROIDAB in the last 72 hours. Anemia Panel: No results for input(s): VITAMINB12, FOLATE, FERRITIN, TIBC, IRON, RETICCTPCT in the last 72 hours. Sepsis Labs:  Recent Labs Lab 05/09/16 1216 05/09/16 1534  LATICACIDVEN 1.46 1.49  Recent Results (from the past 240 hour(s))  Culture, blood (routine x 2)     Status: None   Collection Time: 05/09/16 11:30 AM  Result Value Ref Range Status   Specimen Description BLOOD LEFT FOREARM  Final   Special Requests BOTTLES DRAWN AEROBIC AND ANAEROBIC  5CC  Final   Culture NO GROWTH 5 DAYS  Final   Report Status 05/14/2016 FINAL  Final  Culture, blood (routine x 2)     Status: None   Collection Time: 05/09/16 11:55 AM  Result Value Ref Range Status   Specimen Description BLOOD LEFT HAND  Final   Special Requests BOTTLES DRAWN AEROBIC AND ANAEROBIC  5CC  Final   Culture NO GROWTH 5 DAYS  Final   Report Status 05/14/2016 FINAL  Final  Urine culture     Status: Abnormal   Collection Time: 05/09/16 12:20 PM  Result Value Ref Range Status   Specimen Description URINE, CLEAN CATCH  Final   Special Requests Normal  Final   Culture (A)  Final    >=100,000 COLONIES/mL KLEBSIELLA PNEUMONIAE Confirmed Extended Spectrum Beta-Lactamase Producer (ESBL) >=100,000 COLONIES/mL METHICILLIN RESISTANT STAPHYLOCOCCUS AUREUS    Report Status 05/12/2016 FINAL  Final   Organism ID, Bacteria KLEBSIELLA PNEUMONIAE (A)  Final   Organism ID, Bacteria  METHICILLIN RESISTANT STAPHYLOCOCCUS AUREUS (A)  Final      Susceptibility   Klebsiella pneumoniae - MIC*    AMPICILLIN >=32 RESISTANT Resistant     CEFAZOLIN >=64 RESISTANT Resistant     CEFTRIAXONE <=1 RESISTANT Resistant     CIPROFLOXACIN 2 INTERMEDIATE Intermediate     GENTAMICIN <=1 SENSITIVE Sensitive     IMIPENEM <=0.25 SENSITIVE Sensitive     NITROFURANTOIN >=512 RESISTANT Resistant     TRIMETH/SULFA >=320 RESISTANT Resistant     AMPICILLIN/SULBACTAM >=32 RESISTANT Resistant     PIP/TAZO 32 RESISTANT Resistant     Extended ESBL POSITIVE Resistant     * >=100,000 COLONIES/mL KLEBSIELLA PNEUMONIAE   Methicillin resistant staphylococcus aureus - MIC*    CIPROFLOXACIN >=8 RESISTANT Resistant     GENTAMICIN <=0.5 SENSITIVE Sensitive     NITROFURANTOIN <=16 SENSITIVE Sensitive     OXACILLIN >=4 RESISTANT Resistant     TETRACYCLINE <=1 SENSITIVE Sensitive     VANCOMYCIN <=0.5 SENSITIVE Sensitive     TRIMETH/SULFA >=320 RESISTANT Resistant     CLINDAMYCIN <=0.25 SENSITIVE Sensitive     RIFAMPIN <=0.5 SENSITIVE Sensitive     Inducible Clindamycin NEGATIVE Sensitive     * >=100,000 COLONIES/mL METHICILLIN RESISTANT STAPHYLOCOCCUS AUREUS  MRSA PCR Screening     Status: Abnormal   Collection Time: 05/09/16  8:06 PM  Result Value Ref Range Status   MRSA by PCR POSITIVE (A) NEGATIVE Final    Comment:        The GeneXpert MRSA Assay (FDA approved for NASAL specimens only), is one component of a comprehensive MRSA colonization surveillance program. It is not intended to diagnose MRSA infection nor to guide or monitor treatment for MRSA infections. RESULT CALLED TO, READ BACK BY AND VERIFIED WITH: NEWELL,M RN [id number] 05/09/16 MITCHELL,L          Radiology Studies: No results found.      Scheduled Meds: . carvedilol  3.125 mg Oral BID WC  . Chlorhexidine Gluconate Cloth  6 each Topical Q0600  . dextromethorphan-guaiFENesin  1 tablet Oral BID  . feeding supplement (NEPRO  CARB STEADY)  237 mL Oral TID BM  . ferric gluconate (FERRLECIT/NULECIT) IV  62.5  mg Intravenous Q Wed-HD  . [START ON 05/16/2016] heparin  2,300 Units Dialysis Once in dialysis  . insulin aspart  0-15 Units Subcutaneous Q4H  . mouth rinse  15 mL Mouth Rinse BID  . Melatonin  3 mg Oral QHS  . meropenem (MERREM) IV  500 mg Intravenous Q2000  . multivitamin  1 tablet Oral QHS  . oxybutynin  5 mg Oral BID  . pantoprazole  40 mg Oral BID  . saccharomyces boulardii  250 mg Oral BID  . sertraline  50 mg Oral Daily  . vancomycin  750 mg Intravenous Q M,W,F-HD  . vitamin C  1,000 mg Oral Daily  . zinc sulfate  220 mg Oral Daily   Continuous Infusions:   LOS: 6 days    Anyeli Hockenbury Tanna Furry, MD [facility] Pager [phone]  If 7PM-7AM, please contact night-coverage [email or web address] Password [facility] 05/15/2016, 1:43 PM

## 2016-05-15 NOTE — Progress Notes (Signed)
PT Cancellation Note  Patient Details Name: Lance [name] MRN: [id number] [name]: 1941/02/19   Cancelled Treatment:    Reason Eval/Treat Not Completed: PT screened, no needs identified, will sign off. Pt at baseline level.    Lance [name] 05/15/2016, 11:58 AM

## 2016-05-15 NOTE — Procedures (Signed)
Stable on HD now.  No new c/o's.     I was present at this dialysis session, have reviewed the session itself and made  appropriate changes [name] MD [facility] pager [phone]   05/15/2016, 2:16 PM

## 2016-05-16 DIAGNOSIS — J9690 Respiratory failure, unspecified, unspecified whether with hypoxia or hypercapnia: Secondary | ICD-10-CM

## 2016-05-16 LAB — GLUCOSE, CAPILLARY
GLUCOSE-CAPILLARY: 99 mg/dL (ref 65–99)
GLUCOSE-CAPILLARY: 112 mg/dL — AB (ref 65–99)
GLUCOSE-CAPILLARY: 118 mg/dL — AB (ref 65–99)
Glucose-Capillary: 126 mg/dL — ABNORMAL HIGH (ref 65–99)
Glucose-Capillary: 192 mg/dL — ABNORMAL HIGH (ref 65–99)
Glucose-Capillary: 92 mg/dL (ref 65–99)

## 2016-05-16 NOTE — Clinical Social Work Note (Addendum)
CSW informed that pt will have additional needs post DC. Facility notified, spoke to [facility] and updated on pt's needs post DC. CSW informed facility that pt will need 2 wks of IV Antibiotics and questioned If perif. IV line stay in place. Lance [name] reported that she needed this in writing. CSW updated CN Christy who will page MD.  CSW will continue to follow.  MD added progress note stating what pt will need post DC. CSW faxed progress note to facility. Facility advised CSW pt's bed not available today. Pt's bed will not be available until Monday. Family did not agree to pay while pt was hospitalized and bed was filled by a new resident.  CSW will continue to follow and contact facility Monday AM to ensure bed is available.  Lance [name] B. Joline Maxcy Clinical Social Work Dept Weekend Social Worker [phone] 12:13 PM

## 2016-05-16 NOTE — Progress Notes (Signed)
[facility] Progress Note   Subjective: no new c/o  Vitals:   05/15/16 2032 05/15/16 2300 05/16/16 0455 05/16/16 0816  BP: (!) 115/42 (!) 123/45 (!) 124/52 (!) 136/53  Pulse: 85 77 74 85  Resp: 15 16 12 17   Temp: 98 F (36.7 C) 97.7 F (36.5 C) 97.9 F (36.6 C) 98 F (36.7 C)  TempSrc: Oral Oral Oral Oral  SpO2: 96% 96% 94% 95%  Weight:   71 kg (156 lb 8.4 oz)   Height:        Inpatient medications: . carvedilol  3.125 mg Oral BID WC  . dextromethorphan-guaiFENesin  1 tablet Oral BID  . feeding supplement (NEPRO CARB STEADY)  237 mL Oral TID BM  . ferric gluconate (FERRLECIT/NULECIT) IV  62.5 mg Intravenous Q Wed-HD  . insulin aspart  0-15 Units Subcutaneous Q4H  . mouth rinse  15 mL Mouth Rinse BID  . Melatonin  3 mg Oral QHS  . meropenem (MERREM) IV  500 mg Intravenous Q2000  . multivitamin  1 tablet Oral QHS  . oxybutynin  5 mg Oral BID  . pantoprazole  40 mg Oral BID  . saccharomyces boulardii  250 mg Oral BID  . sertraline  50 mg Oral Daily  . vancomycin  750 mg Intravenous Q M,W,F-HD  . vitamin C  1,000 mg Oral Daily  . zinc sulfate  220 mg Oral Daily    acetaminophen **OR** acetaminophen, ondansetron **OR** ondansetron (ZOFRAN) IV  Exam: [name], no distress, on HD No jvd Chest dec'd BS bilat RRR no mrg Abd soft ntnd w foley in place Ext bilat protective boots, R foot contracted R IJ cath/ R FA AVF+bruit  Dialysis: [facility]    3.5h   76kg    2/2 bath  Hep 2300   RFA AVF (03/24/16)/ R IJ cath  - Mirc 100 last 1/03  - Venofer 50/wk Labs: pth 35 P 3.5 Ca 11.7  Hb 9.5      Assessment: 1. UTI MRSA + ESBL Klebsiella - per primary will need vanc w HD for MRSA and also PICC line for Klebsiella  2. ESRD on HD mwf, maturing AVF 3. MS/ quadriplegic/ NGB - indwelling foley 4. HTN - bp's better off of coreg 5. Anemia next esa 1/17 6. Melena - resolved, no hep for now w hd 7. Sacral decub - per primary 8. 2HPTH - no chg meds 9. DM  on insulin 10. Vol at dry wt 11. DNR  Plan - HD Monday, IV abx as above   [name] MD [facility] pager [phone]   05/16/2016, 10:40 AM    Recent Labs Lab 05/12/16 0615 05/14/16 0541 05/15/16 1919  NA 133* 137 134*  K 4.4 3.8 4.0  CL 99* 100* 96*  CO2 23 26 27   GLUCOSE 109* 145* 156*  BUN 30* 21* 9  CREATININE 3.02* 2.86* 1.67*  CALCIUM 8.4* 8.7* 8.1*  PHOS  --   --  1.5*    Recent Labs Lab 05/09/16 1151 05/15/16 1919  AST 16  --   ALT 13*  --   ALKPHOS 67  --   BILITOT 0.8  --   PROT 7.3  --   ALBUMIN 2.1* 1.7*    Recent Labs Lab 05/09/16 1151  05/13/16 0607 05/14/16 0541 05/15/16 1919  WBC 9.5  < > 10.7* 12.1* 8.1  NEUTROABS 7.7  --   --   --   --   HGB 9.4*  < >  8.0* 7.9* 8.0*  HCT 29.8*  < > 24.1* 25.1* 24.7*  MCV 97.7  < > 98.0 97.7 95.4  PLT 199  < > 216 256 278  < > = values in this interval not displayed. Iron/TIBC/Ferritin/ %Sat    Component Value Date/Time   IRON 28 (L) 02/15/2016 0532   TIBC 193 (L) 02/15/2016 0532   FERRITIN 316 08/31/2014 0036   IRONPCTSAT 14 (L) 02/15/2016 0532

## 2016-05-16 NOTE — Progress Notes (Signed)
PROGRESS NOTE  Lance [name] KHT:[id number] [name]: 1941/01/18 DOA: 05/09/2016 PCP: [name], MD   LOS: 7 days   Brief Narrative: 76 y.o.malewith past mental history significant for congestive heart failure with last 2 D ECHO in 02/2016 with EF 35% and mild myxomatous degeneration, end-stage renal disease, recurrent UTI, MS, from [facility] who presented to ED with vomiting, diarrhea, shortness of breath. His O2 sat was 77%at the time EMS arrived. The physician at the SNF was working him up for urinary tract infection. He had not been given any antibiotics. Patient reported that he vomited blood.Patient started on vancomycin and cefepime on admission. Patient placed on BiPAP due to hypoxia. Patient had a bloody melanotic stool. Patient also appeared to have blood on UA. Report given that patient was on heparin at his facility to prevent DVTs. INR was normal and initial lactic acid was normal.   Assessment & Plan: Principal Problem:   Respiratory failure (HCC) Active Problems:   Sacral decubitus ulcer   DM (diabetes mellitus) type II controlled with renal manifestation (HCC)   Depression   SIRS (systemic inflammatory response syndrome) (HCC)   Melena   Hematemesis   Sepsis secondary to left lower lobe pneumonia and Klebsiella and MRSA UTI  - Sepsis criteria met on admission with fever, tachycardia, tachypnea, hypotension, hypoxia, leukocytosis - Source of infection likely Klebsiella and MRSA UTI as well as pneumonia - CXR showed left basilar airspace opacity, likely within the left lower lobe - Currently on vancomycin day 8 and meropenem day 5. Will need 14 days treatment for complicated UTI.  - Blood cultures negative so far.  - replace foley  Hematemesis - resolved now, h/h stable today. Monitor CBC. ? MW tear - Continue protonix 40 mg Q 12 hours   End stage renal disease on HD - HD MWF - Management per renal. Hemodialysis today.  Anemia of chronic disease    - Monitor CBC. On IV iron during dialysis.  DM (diabetes mellitus) type II controlled with renal manifestation with long term insulin use (HCC) - Continue sliding scale insulin. Encourage oral intake.  Depression - Continue Zoloft   Essential hypertension - Continue carvedilol   Stage 4 right ischium pressure ulcer / stage 3 left ischium pressure ulcer - Wound care consult assessment appreciated. Continue dressing change. Patient will benefit from outpatient wound care evaluation and treatment.    DVT prophylaxis: SCDs Code Status: DNR Family Communication: no family at bedside Disposition Plan: SNF 1-2 days  Consultants:   Nephrology   Procedures:   none  Antimicrobials:  Vancomycin day 8  Meropenem day 5   Subjective: - no chest pain, shortness of breath, no abdominal pain, nausea or vomiting. First day when he is feeling well  Objective: Vitals:   05/15/16 2032 05/15/16 2300 05/16/16 0455 05/16/16 0816  BP: (!) 115/42 (!) 123/45 (!) 124/52 (!) 136/53  Pulse: 85 77 74 85  Resp: _0 Temp: 98 F (36.7 C) 97.7 F (36.5 C) 97.9 F (36.6 C) 98 F (36.7 C)  TempSrc: Oral Oral Oral Oral  SpO2: 96% 96% 94% 95%  Weight:   71 kg (156 lb 8.4 oz)   Height:        Intake/Output Summary (Last 24 hours) at 05/16/16 1115 Last data filed at 05/15/16 1821  Gross per 24 hour  Intake              120 ml  Output  0 ml  Net              120 ml   Filed Weights   05/15/16 1300 05/15/16 1643 05/16/16 0455  Weight: 69 kg (152 lb 1.9 oz) 70 kg (154 lb 5.2 oz) 71 kg (156 lb 8.4 oz)    Examination: Constitutional: NAD Vitals:   05/15/16 2032 05/15/16 2300 05/16/16 0455 05/16/16 0816  BP: (!) 115/42 (!) 123/45 (!) 124/52 (!) 136/53  Pulse: 85 77 74 85  Resp: _0 Temp: 98 F (36.7 C) 97.7 F (36.5 C) 97.9 F (36.6 C) 98 F (36.7 C)  TempSrc: Oral Oral Oral Oral  SpO2: 96% 96% 94% 95%  Weight:   71 kg (156 lb 8.4 oz)   Height:        General exam: no distress  Respiratory system: No wheezing, no rhonchi  Cardiovascular system: S1 & S2 heard, rate controlled  Gastrointestinal system: (+) BS, non tender abd  Central nervous system: No focal neurological deficits. Extremities: No tenderness, palpable pulses  Skin: No rashes, lesions or ulcers, skin is warm and dry  Psychiatry: Mood & affect appropriate.    Data Reviewed: I have personally reviewed following labs and imaging studies  CBC:  Recent Labs Lab 05/09/16 1151  05/11/16 0500 05/12/16 0615 05/13/16 0607 05/14/16 0541 05/15/16 1919  WBC 9.5  < > 14.0* 13.9* 10.7* 12.1* 8.1  NEUTROABS 7.7  --   --   --   --   --   --   HGB 9.4*  < > 8.2* 8.2* 8.0* 7.9* 8.0*  HCT 29.8*  < > 25.9* 25.6* 24.1* 25.1* 24.7*  MCV 97.7  < > 97.0 97.7 98.0 97.7 95.4  PLT 199  < > 221 202 216 256 278  < > = values in this interval not displayed. Basic Metabolic Panel:  Recent Labs Lab 05/10/16 0410 05/11/16 0500 05/12/16 0615 05/14/16 0541 05/15/16 1919  NA 133* 135 133* 137 134*  K 3.4* 3.1* 4.4 3.8 4.0  CL 94* 95* 99* 100* 96*  CO2 _1 GLUCOSE 146* 130* 109* 145* 156*  BUN 18 41* 30* 21* 9  CREATININE 2.95* 4.62* 3.02* 2.86* 1.67*  CALCIUM 7.3* 8.1* 8.4* 8.7* 8.1*  MG  --   --  1.9  --   --   PHOS  --   --   --   --  1.5*   GFR: Estimated Creatinine Clearance: 38.4 mL/min (by C-G formula based on SCr of 1.67 mg/dL (H)). Liver Function Tests:  Recent Labs Lab 05/09/16 1151 05/15/16 1919  AST 16  --   ALT 13*  --   ALKPHOS 67  --   BILITOT 0.8  --   PROT 7.3  --   ALBUMIN 2.1* 1.7*   No results for input(s): LIPASE, AMYLASE in the last 168 hours. No results for input(s): AMMONIA in the last 168 hours. Coagulation Profile:  Recent Labs Lab 05/09/16 1509  INR 1.10   Cardiac Enzymes: No results for input(s): CKTOTAL, CKMB, CKMBINDEX, TROPONINI in the last 168 hours. BNP (last 3 results) No results for input(s): PROBNP in the  last 8760 hours. HbA1C: No results for input(s): HGBA1C in the last 72 hours. CBG:  Recent Labs Lab 05/15/16 1807 05/15/16 2042 05/16/16 0033 05/16/16 0513 05/16/16 0815  GLUCAP 102* 176* 126* 99 112*   Lipid Profile: No results for input(s): CHOL, HDL, LDLCALC, TRIG, CHOLHDL, LDLDIRECT in the last  72 hours. Thyroid Function Tests: No results for input(s): TSH, T4TOTAL, FREET4, T3FREE, THYROIDAB in the last 72 hours. Anemia Panel: No results for input(s): VITAMINB12, FOLATE, FERRITIN, TIBC, IRON, RETICCTPCT in the last 72 hours. Urine analysis:    Component Value Date/Time   COLORURINE AMBER (A) 05/09/2016 1220   APPEARANCEUR CLOUDY (A) 05/09/2016 1220   LABSPEC 1.012 05/09/2016 1220   PHURINE 8.0 05/09/2016 1220   GLUCOSEU NEGATIVE 05/09/2016 1220   HGBUR LARGE (A) 05/09/2016 1220   BILIRUBINUR NEGATIVE 05/09/2016 1220   KETONESUR NEGATIVE 05/09/2016 1220   PROTEINUR 100 (A) 05/09/2016 1220   UROBILINOGEN 0.2 08/30/2014 2203   NITRITE NEGATIVE 05/09/2016 1220   LEUKOCYTESUR SMALL (A) 05/09/2016 1220   Sepsis Labs: Invalid input(s): PROCALCITONIN, LACTICIDVEN  Recent Results (from the past 240 hour(s))  Culture, blood (routine x 2)     Status: None   Collection Time: 05/09/16 11:30 AM  Result Value Ref Range Status   Specimen Description BLOOD LEFT FOREARM  Final   Special Requests BOTTLES DRAWN AEROBIC AND ANAEROBIC  5CC  Final   Culture NO GROWTH 5 DAYS  Final   Report Status 05/14/2016 FINAL  Final  Culture, blood (routine x 2)     Status: None   Collection Time: 05/09/16 11:55 AM  Result Value Ref Range Status   Specimen Description BLOOD LEFT HAND  Final   Special Requests BOTTLES DRAWN AEROBIC AND ANAEROBIC  5CC  Final   Culture NO GROWTH 5 DAYS  Final   Report Status 05/14/2016 FINAL  Final  Urine culture     Status: Abnormal   Collection Time: 05/09/16 12:20 PM  Result Value Ref Range Status   Specimen Description URINE, CLEAN CATCH  Final   Special  Requests Normal  Final   Culture (A)  Final    >=100,000 COLONIES/mL KLEBSIELLA PNEUMONIAE Confirmed Extended Spectrum Beta-Lactamase Producer (ESBL) >=100,000 COLONIES/mL METHICILLIN RESISTANT STAPHYLOCOCCUS AUREUS    Report Status 05/12/2016 FINAL  Final   Organism ID, Bacteria KLEBSIELLA PNEUMONIAE (A)  Final   Organism ID, Bacteria METHICILLIN RESISTANT STAPHYLOCOCCUS AUREUS (A)  Final      Susceptibility   Klebsiella pneumoniae - MIC*    AMPICILLIN >=32 RESISTANT Resistant     CEFAZOLIN >=64 RESISTANT Resistant     CEFTRIAXONE <=1 RESISTANT Resistant     CIPROFLOXACIN 2 INTERMEDIATE Intermediate     GENTAMICIN <=1 SENSITIVE Sensitive     IMIPENEM <=0.25 SENSITIVE Sensitive     NITROFURANTOIN >=512 RESISTANT Resistant     TRIMETH/SULFA >=320 RESISTANT Resistant     AMPICILLIN/SULBACTAM >=32 RESISTANT Resistant     PIP/TAZO 32 RESISTANT Resistant     Extended ESBL POSITIVE Resistant     * >=100,000 COLONIES/mL KLEBSIELLA PNEUMONIAE   Methicillin resistant staphylococcus aureus - MIC*    CIPROFLOXACIN >=8 RESISTANT Resistant     GENTAMICIN <=0.5 SENSITIVE Sensitive     NITROFURANTOIN <=16 SENSITIVE Sensitive     OXACILLIN >=4 RESISTANT Resistant     TETRACYCLINE <=1 SENSITIVE Sensitive     VANCOMYCIN <=0.5 SENSITIVE Sensitive     TRIMETH/SULFA >=320 RESISTANT Resistant     CLINDAMYCIN <=0.25 SENSITIVE Sensitive     RIFAMPIN <=0.5 SENSITIVE Sensitive     Inducible Clindamycin NEGATIVE Sensitive     * >=100,000 COLONIES/mL METHICILLIN RESISTANT STAPHYLOCOCCUS AUREUS  MRSA PCR Screening     Status: Abnormal   Collection Time: 05/09/16  8:06 PM  Result Value Ref Range Status   MRSA by PCR POSITIVE (A) NEGATIVE Final  Comment:        The GeneXpert MRSA Assay (FDA approved for NASAL specimens only), is one component of a comprehensive MRSA colonization surveillance program. It is not intended to diagnose MRSA infection nor to guide or monitor treatment for MRSA  infections. RESULT CALLED TO, READ BACK BY AND VERIFIED WITH: NEWELL,M RN 2248 05/09/16 MITCHELL,L       Radiology Studies: No results found.   Scheduled Meds: . carvedilol  3.125 mg Oral BID WC  . dextromethorphan-guaiFENesin  1 tablet Oral BID  . feeding supplement (NEPRO CARB STEADY)  237 mL Oral TID BM  . ferric gluconate (FERRLECIT/NULECIT) IV  62.5 mg Intravenous Q Wed-HD  . insulin aspart  0-15 Units Subcutaneous Q4H  . mouth rinse  15 mL Mouth Rinse BID  . Melatonin  3 mg Oral QHS  . meropenem (MERREM) IV  500 mg Intravenous Q2000  . multivitamin  1 tablet Oral QHS  . oxybutynin  5 mg Oral BID  . pantoprazole  40 mg Oral BID  . saccharomyces boulardii  250 mg Oral BID  . sertraline  50 mg Oral Daily  . vancomycin  750 mg Intravenous Q M,W,F-HD  . vitamin C  1,000 mg Oral Daily  . zinc sulfate  220 mg Oral Daily   Continuous Infusions:   Marzetta Board, MD, PhD [facility] Pager [phone]  If 7PM-7AM, please contact night-coverage [email or web address] Password [email or web address] 05/16/2016, 11:15 AM

## 2016-05-17 LAB — GLUCOSE, CAPILLARY
GLUCOSE-CAPILLARY: 99 mg/dL (ref 65–99)
GLUCOSE-CAPILLARY: 96 mg/dL (ref 65–99)
GLUCOSE-CAPILLARY: 113 mg/dL — AB (ref 65–99)
Glucose-Capillary: 128 mg/dL — ABNORMAL HIGH (ref 65–99)
Glucose-Capillary: 146 mg/dL — ABNORMAL HIGH (ref 65–99)
Glucose-Capillary: 196 mg/dL — ABNORMAL HIGH (ref 65–99)

## 2016-05-17 MED ORDER — PENTAFLUOROPROP-TETRAFLUOROETH EX AERO: 1.0000 "application " | INHALATION_SPRAY | CUTANEOUS | Status: DC | PRN

## 2016-05-17 MED ORDER — LIDOCAINE HCL (PF) 1 % IJ SOLN: 5.0000 mL | INTRAMUSCULAR | Status: DC | PRN

## 2016-05-17 MED ORDER — SODIUM CHLORIDE 0.9 % IV SOLN: 100.0000 mL | INTRAVENOUS | Status: DC | PRN

## 2016-05-17 MED ORDER — ALTEPLASE 2 MG IJ SOLR: 2.0000 mg | Freq: Once | INTRAMUSCULAR | Status: DC | PRN

## 2016-05-17 MED ORDER — LIDOCAINE-PRILOCAINE 2.5-2.5 % EX CREA: 1.0000 "application " | TOPICAL_CREAM | CUTANEOUS | Status: DC | PRN

## 2016-05-17 MED ORDER — HEPARIN SODIUM (PORCINE) 1000 UNIT/ML DIALYSIS: 1000.0000 [IU] | INTRAMUSCULAR | Status: DC | PRN

## 2016-05-17 MED ORDER — POLYETHYLENE GLYCOL 3350 17 G PO PACK
17.0000 g | PACK | Freq: Every day | ORAL | Status: DC
  Administered 2016-05-17 – 2016-05-18 (×2): 17 g via ORAL
  Filled 2016-05-17 (×2): qty 1

## 2016-05-17 MED ORDER — HEPARIN SODIUM (PORCINE) 1000 UNIT/ML DIALYSIS: 2300.0000 [IU] | Freq: Once | INTRAMUSCULAR | Status: DC

## 2016-05-17 NOTE — Progress Notes (Addendum)
  [facility] Progress Note   Subjective: no new c/o  Vitals:   05/16/16 1142 05/16/16 1700 05/16/16 2037 05/17/16 0537  BP: (!) 120/48  (!) 131/53 (!) 122/56  Pulse: 85     Resp: 12     Temp: 98.1 F (36.7 C) 98.2 F (36.8 C) 98 F (36.7 C) 98.2 F (36.8 C)  TempSrc: Oral Oral Oral Oral  SpO2: 92%     Weight:    79 kg (174 lb 2.6 oz)  Height:        Inpatient medications: . carvedilol  3.125 mg Oral BID WC  . dextromethorphan-guaiFENesin  1 tablet Oral BID  . feeding supplement (NEPRO CARB STEADY)  237 mL Oral TID BM  . ferric gluconate (FERRLECIT/NULECIT) IV  62.5 mg Intravenous Q Wed-HD  . insulin aspart  0-15 Units Subcutaneous Q4H  . mouth rinse  15 mL Mouth Rinse BID  . Melatonin  3 mg Oral QHS  . meropenem (MERREM) IV  500 mg Intravenous Q2000  . multivitamin  1 tablet Oral QHS  . oxybutynin  5 mg Oral BID  . pantoprazole  40 mg Oral BID  . saccharomyces boulardii  250 mg Oral BID  . sertraline  50 mg Oral Daily  . vancomycin  750 mg Intravenous Q M,W,F-HD  . vitamin C  1,000 mg Oral Daily  . zinc sulfate  220 mg Oral Daily    acetaminophen **OR** acetaminophen, ondansetron **OR** ondansetron (ZOFRAN) IV  Exam: [name], no distress, on HD No jvd Chest dec'd BS bilat RRR no mrg Abd soft ntnd w foley in place Ext bilat protective boots, R foot contracted R IJ cath/ R FA AVF+bruit  D[facility]: [facility]    3.5h   76kg    2/2 bath  Hep 2300   RFA AVF (03/24/16)/ R IJ cath  - Mirc 100 last 1/03  - Venofer 50/wk Labs: pth 35 P 3.5 Ca 11.7  Hb 9.5      Assessment: 1. UTI MRSA + ESBL Klebsiella - on IV vanc/ Maxipime, per primary 2. MS/ quadriplegic/ NGB - indwelling foley 3. HTN - bp's better off of coreg 4. Anemia next esa 1/17 5. Melena - resolved, no hep for now w hd 6. Sacral decub - per primary 7. 2HPTH - no chg meds 8. DM on insulin 9. Vol at dry wt 10. DNR 11. Dispo - dc to SNF soon, possibly Monday  Plan - HD Monday,  IV abx as above   [name] MD [facility] pager [phone]   05/17/2016, 7:11 AM    Recent Labs Lab 05/12/16 0615 05/14/16 0541 05/15/16 1919  NA 133* 137 134*  K 4.4 3.8 4.0  CL 99* 100* 96*  CO2 23 26 27   GLUCOSE 109* 145* 156*  BUN 30* 21* 9  CREATININE 3.02* 2.86* 1.67*  CALCIUM 8.4* 8.7* 8.1*  PHOS  --   --  1.5*    Recent Labs Lab 05/15/16 1919  ALBUMIN 1.7*    Recent Labs Lab 05/13/16 0607 05/14/16 0541 05/15/16 1919  WBC 10.7* 12.1* 8.1  HGB 8.0* 7.9* 8.0*  HCT 24.1* 25.1* 24.7*  MCV 98.0 97.7 95.4  PLT 216 256 278   Iron/TIBC/Ferritin/ %Sat    Component Value Date/Time   IRON 28 (L) 02/15/2016 0532   TIBC 193 (L) 02/15/2016 0532   FERRITIN 316 08/31/2014 0036   IRONPCTSAT 14 (L) 02/15/2016 0532

## 2016-05-17 NOTE — Progress Notes (Signed)
PROGRESS NOTE  Lance [name] KXF:[id number] [name]: Mar 13, 1941 DOA: 05/09/2016 PCP: [name], MD   LOS: 8 days   Brief Narrative: 76 y.o.malewith past mental history significant for congestive heart failure with last 2 D ECHO in 02/2016 with EF 35% and mild myxomatous degeneration, end-stage renal disease, recurrent UTI, MS, from [facility] SNF who presented to ED with vomiting, diarrhea, shortness of breath. His O2 sat was 77%at the time EMS arrived. The physician at the SNF was working him up for urinary tract infection. He had not been given any antibiotics. Patient reported that he vomited blood.Patient started on vancomycin and cefepime on admission. Patient placed on BiPAP due to hypoxia. Patient had a bloody melanotic stool. Patient also appeared to have blood on UA. Report given that patient was on heparin at his facility to prevent DVTs. INR was normal and initial lactic acid was normal.   Assessment & Plan: Principal Problem:   Respiratory failure (HCC) Active Problems:   Sacral decubitus ulcer   DM (diabetes mellitus) type II controlled with renal manifestation (HCC)   Depression   SIRS (systemic inflammatory response syndrome) (HCC)   Melena   Hematemesis   Sepsis secondary to left lower lobe pneumonia and Klebsiella and MRSA UTI  - Sepsis criteria met on admission with fever, tachycardia, tachypnea, hypotension, hypoxia, leukocytosis - Source of infection likely Klebsiella and MRSA UTI as well as pneumonia - CXR showed left basilar airspace opacity, likely within the left lower lobe - Currently on vancomycin day 9 and meropenem day 6. Will need 14 days treatment for complicated UTI.  - Blood cultures negative so far.  - replaced foleyon 1/13  Hematemesis - resolved, h/h overall stabl. Monitor CBC closely, will need a repeat Hb 1/15. ? MW tear - Continue protonix 40 mg Q 12 hours for now  End stage renal disease on HD - HD MWF - Management per renal. D/w  Dr. Jonnie Finner. Will do HD Monday morning. Should be able to go to SNF afterwards if Hb stable as well.  Anemia of chronic disease  - Monitor CBC. On IV iron during dialysis.  DM (diabetes mellitus) type II controlled with renal manifestation with long term insulin use (HCC) - Continue sliding scale insulin. Encourage oral intake.  Depression - Continue Zoloft   Essential hypertension - Continue carvedilol   Stage 4 right ischium pressure ulcer / stage 3 left ischium pressure ulcer - Wound care consult assessment appreciated. Continue dressing change. Patient will benefit from outpatient wound care evaluation and treatment.    DVT prophylaxis: SCDs Code Status: DNR Family Communication: no family at bedside Disposition Plan: SNF 1-2 days  Consultants:   Nephrology   Procedures:   none  Antimicrobials:  Vancomycin day 9  Meropenem day 6   Subjective: - no chest pain, shortness of breath, no abdominal pain, nausea or vomiting. Feeling overall OK  Objective: Vitals:   05/16/16 1700 05/16/16 2037 05/17/16 0537 05/17/16 0725  BP:  (!) 131/53 (!) 122/56 (!) 147/75  Pulse:    92  Resp:    19  Temp: 98.2 F (36.8 C) 98 F (36.7 C) 98.2 F (36.8 C) 98.3 F (36.8 C)  TempSrc: Oral Oral Oral Oral  SpO2:    97%  Weight:   79 kg (174 lb 2.6 oz)   Height:        Intake/Output Summary (Last 24 hours) at 05/17/16 1116 Last data filed at 05/17/16 0918  Gross per 24 hour  Intake  400 ml  Output              225 ml  Net              175 ml   Filed Weights   05/15/16 1643 05/16/16 0455 05/17/16 0537  Weight: 70 kg (154 lb 5.2 oz) 71 kg (156 lb 8.4 oz) 79 kg (174 lb 2.6 oz)    Examination: Constitutional: NAD Vitals:   05/16/16 1700 05/16/16 2037 05/17/16 0537 05/17/16 0725  BP:  (!) 131/53 (!) 122/56 (!) 147/75  Pulse:    92  Resp:    19  Temp: 98.2 F (36.8 C) 98 F (36.7 C) 98.2 F (36.8 C) 98.3 F (36.8 C)  TempSrc: Oral Oral Oral Oral    SpO2:    97%  Weight:   79 kg (174 lb 2.6 oz)   Height:       General exam: no distress  Respiratory system: No wheezing, no rhonchi  Cardiovascular system: S1 & S2 heard, rate controlled  Gastrointestinal system: (+) BS, non tender abd  Central nervous system: No focal neurological deficits. Extremities: No tenderness, palpable pulses  Skin: No rashes, lesions or ulcers, skin is warm and dry  Psychiatry: Mood & affect appropriate.    Data Reviewed: I have personally reviewed following labs and imaging studies  CBC:  Recent Labs Lab 05/11/16 0500 05/12/16 0615 05/13/16 0607 05/14/16 0541 05/15/16 1919  WBC 14.0* 13.9* 10.7* 12.1* 8.1  HGB 8.2* 8.2* 8.0* 7.9* 8.0*  HCT 25.9* 25.6* 24.1* 25.1* 24.7*  MCV 97.0 97.7 98.0 97.7 95.4  PLT 221 202 216 256 939   Basic Metabolic Panel:  Recent Labs Lab 05/11/16 0500 05/12/16 0615 05/14/16 0541 05/15/16 1919  NA 135 133* 137 134*  K 3.1* 4.4 3.8 4.0  CL 95* 99* 100* 96*  CO2 '28 23 26 27  ' GLUCOSE 130* 109* 145* 156*  BUN 41* 30* 21* 9  CREATININE 4.62* 3.02* 2.86* 1.67*  CALCIUM 8.1* 8.4* 8.7* 8.1*  MG  --  1.9  --   --   PHOS  --   --   --  1.5*   GFR: Estimated Creatinine Clearance: 39.5 mL/min (by C-G formula based on SCr of 1.67 mg/dL (H)). Liver Function Tests:  Recent Labs Lab 05/15/16 1919  ALBUMIN 1.7*   No results for input(s): LIPASE, AMYLASE in the last 168 hours. No results for input(s): AMMONIA in the last 168 hours. Coagulation Profile: No results for input(s): INR, PROTIME in the last 168 hours. Cardiac Enzymes: No results for input(s): CKTOTAL, CKMB, CKMBINDEX, TROPONINI in the last 168 hours. BNP (last 3 results) No results for input(s): PROBNP in the last 8760 hours. HbA1C: No results for input(s): HGBA1C in the last 72 hours. CBG:  Recent Labs Lab 05/16/16 1713 05/16/16 2035 05/17/16 0029 05/17/16 0415 05/17/16 0717  GLUCAP 92 118* 128* 99 96   Lipid Profile: No results for  input(s): CHOL, HDL, LDLCALC, TRIG, CHOLHDL, LDLDIRECT in the last 72 hours. Thyroid Function Tests: No results for input(s): TSH, T4TOTAL, FREET4, T3FREE, THYROIDAB in the last 72 hours. Anemia Panel: No results for input(s): VITAMINB12, FOLATE, FERRITIN, TIBC, IRON, RETICCTPCT in the last 72 hours. Urine analysis:    Component Value Date/Time   COLORURINE AMBER (A) 05/09/2016 1220   APPEARANCEUR CLOUDY (A) 05/09/2016 1220   LABSPEC 1.012 05/09/2016 1220   PHURINE 8.0 05/09/2016 1220   GLUCOSEU NEGATIVE 05/09/2016 1220   HGBUR LARGE (A) 05/09/2016 1220  BILIRUBINUR NEGATIVE 05/09/2016 1220   KETONESUR NEGATIVE 05/09/2016 1220   PROTEINUR 100 (A) 05/09/2016 1220   UROBILINOGEN 0.2 08/30/2014 2203   NITRITE NEGATIVE 05/09/2016 1220   LEUKOCYTESUR SMALL (A) 05/09/2016 1220   Sepsis Labs: Invalid input(s): PROCALCITONIN, LACTICIDVEN  Recent Results (from the past 240 hour(s))  Culture, blood (routine x 2)     Status: None   Collection Time: 05/09/16 11:30 AM  Result Value Ref Range Status   Specimen Description BLOOD LEFT FOREARM  Final   Special Requests BOTTLES DRAWN AEROBIC AND ANAEROBIC  5CC  Final   Culture NO GROWTH 5 DAYS  Final   Report Status 05/14/2016 FINAL  Final  Culture, blood (routine x 2)     Status: None   Collection Time: 05/09/16 11:55 AM  Result Value Ref Range Status   Specimen Description BLOOD LEFT HAND  Final   Special Requests BOTTLES DRAWN AEROBIC AND ANAEROBIC  5CC  Final   Culture NO GROWTH 5 DAYS  Final   Report Status 05/14/2016 FINAL  Final  Urine culture     Status: Abnormal   Collection Time: 05/09/16 12:20 PM  Result Value Ref Range Status   Specimen Description URINE, CLEAN CATCH  Final   Special Requests Normal  Final   Culture (A)  Final    >=100,000 COLONIES/mL KLEBSIELLA PNEUMONIAE Confirmed Extended Spectrum Beta-Lactamase Producer (ESBL) >=100,000 COLONIES/mL METHICILLIN RESISTANT STAPHYLOCOCCUS AUREUS    Report Status 05/12/2016  FINAL  Final   Organism ID, Bacteria KLEBSIELLA PNEUMONIAE (A)  Final   Organism ID, Bacteria METHICILLIN RESISTANT STAPHYLOCOCCUS AUREUS (A)  Final      Susceptibility   Klebsiella pneumoniae - MIC*    AMPICILLIN >=32 RESISTANT Resistant     CEFAZOLIN >=64 RESISTANT Resistant     CEFTRIAXONE <=1 RESISTANT Resistant     CIPROFLOXACIN 2 INTERMEDIATE Intermediate     GENTAMICIN <=1 SENSITIVE Sensitive     IMIPENEM <=0.25 SENSITIVE Sensitive     NITROFURANTOIN >=512 RESISTANT Resistant     TRIMETH/SULFA >=320 RESISTANT Resistant     AMPICILLIN/SULBACTAM >=32 RESISTANT Resistant     PIP/TAZO 32 RESISTANT Resistant     Extended ESBL POSITIVE Resistant     * >=100,000 COLONIES/mL KLEBSIELLA PNEUMONIAE   Methicillin resistant staphylococcus aureus - MIC*    CIPROFLOXACIN >=8 RESISTANT Resistant     GENTAMICIN <=0.5 SENSITIVE Sensitive     NITROFURANTOIN <=16 SENSITIVE Sensitive     OXACILLIN >=4 RESISTANT Resistant     TETRACYCLINE <=1 SENSITIVE Sensitive     VANCOMYCIN <=0.5 SENSITIVE Sensitive     TRIMETH/SULFA >=320 RESISTANT Resistant     CLINDAMYCIN <=0.25 SENSITIVE Sensitive     RIFAMPIN <=0.5 SENSITIVE Sensitive     Inducible Clindamycin NEGATIVE Sensitive     * >=100,000 COLONIES/mL METHICILLIN RESISTANT STAPHYLOCOCCUS AUREUS  MRSA PCR Screening     Status: Abnormal   Collection Time: 05/09/16  8:06 PM  Result Value Ref Range Status   MRSA by PCR POSITIVE (A) NEGATIVE Final    Comment:        The GeneXpert MRSA Assay (FDA approved for NASAL specimens only), is one component of a comprehensive MRSA colonization surveillance program. It is not intended to diagnose MRSA infection nor to guide or monitor treatment for MRSA infections. RESULT CALLED TO, READ BACK BY AND VERIFIED WITH: NEWELL,M RN 2248 05/09/16 MITCHELL,L       Radiology Studies: No results found.   Scheduled Meds: . carvedilol  3.125 mg Oral BID WC  .  dextromethorphan-guaiFENesin  1 tablet Oral BID    . feeding supplement (NEPRO CARB STEADY)  237 mL Oral TID BM  . ferric gluconate (FERRLECIT/NULECIT) IV  62.5 mg Intravenous Q Wed-HD  . [START ON 05/18/2016] heparin  2,300 Units Dialysis Once in dialysis  . insulin aspart  0-15 Units Subcutaneous Q4H  . mouth rinse  15 mL Mouth Rinse BID  . Melatonin  3 mg Oral QHS  . meropenem (MERREM) IV  500 mg Intravenous Q2000  . multivitamin  1 tablet Oral QHS  . oxybutynin  5 mg Oral BID  . pantoprazole  40 mg Oral BID  . polyethylene glycol  17 g Oral Daily  . saccharomyces boulardii  250 mg Oral BID  . sertraline  50 mg Oral Daily  . vancomycin  750 mg Intravenous Q M,W,F-HD  . vitamin C  1,000 mg Oral Daily  . zinc sulfate  220 mg Oral Daily   Continuous Infusions:   Marzetta Board, MD, PhD [facility] Pager [phone]  If 7PM-7AM, please contact night-coverage [email or web address] Password [email or web address] 05/17/2016, 11:16 AM

## 2016-05-17 NOTE — Progress Notes (Signed)
Received call from unit Charge RN-regarding pt's request to go to [facility]- on review of chart pt does not meet criteria for LTACH at this time- as he is established at outpt HD center and his iv abx can be managed by HD and the SNF- have spoken with the pt and explained that the SNF can handle his current needs and meet his current level of care. LTACH is not appropriate and pt can return to SNF level. MD to be notified by charge nurse on unit.

## 2016-05-18 DIAGNOSIS — N319 Neuromuscular dysfunction of bladder, unspecified: Secondary | ICD-10-CM | POA: Diagnosis not present

## 2016-05-18 DIAGNOSIS — J181 Lobar pneumonia, unspecified organism: Secondary | ICD-10-CM | POA: Diagnosis not present

## 2016-05-18 DIAGNOSIS — L89310 Pressure ulcer of right buttock, unstageable: Secondary | ICD-10-CM | POA: Diagnosis not present

## 2016-05-18 DIAGNOSIS — D509 Iron deficiency anemia, unspecified: Secondary | ICD-10-CM | POA: Diagnosis not present

## 2016-05-18 DIAGNOSIS — B9562 Methicillin resistant Staphylococcus aureus infection as the cause of diseases classified elsewhere: Secondary | ICD-10-CM | POA: Diagnosis not present

## 2016-05-18 DIAGNOSIS — B999 Unspecified infectious disease: Secondary | ICD-10-CM | POA: Diagnosis not present

## 2016-05-18 DIAGNOSIS — N390 Urinary tract infection, site not specified: Secondary | ICD-10-CM | POA: Diagnosis not present

## 2016-05-18 DIAGNOSIS — E1121 Type 2 diabetes mellitus with diabetic nephropathy: Secondary | ICD-10-CM | POA: Diagnosis not present

## 2016-05-18 DIAGNOSIS — N183 Chronic kidney disease, stage 3 (moderate): Secondary | ICD-10-CM

## 2016-05-18 DIAGNOSIS — A419 Sepsis, unspecified organism: Secondary | ICD-10-CM | POA: Diagnosis not present

## 2016-05-18 DIAGNOSIS — E119 Type 2 diabetes mellitus without complications: Secondary | ICD-10-CM | POA: Diagnosis not present

## 2016-05-18 DIAGNOSIS — G35 Multiple sclerosis: Secondary | ICD-10-CM | POA: Diagnosis not present

## 2016-05-18 DIAGNOSIS — E1129 Type 2 diabetes mellitus with other diabetic kidney complication: Secondary | ICD-10-CM | POA: Diagnosis not present

## 2016-05-18 DIAGNOSIS — N2581 Secondary hyperparathyroidism of renal origin: Secondary | ICD-10-CM | POA: Diagnosis not present

## 2016-05-18 DIAGNOSIS — L8992 Pressure ulcer of unspecified site, stage 2: Secondary | ICD-10-CM | POA: Diagnosis not present

## 2016-05-18 DIAGNOSIS — R6510 Systemic inflammatory response syndrome (SIRS) of non-infectious origin without acute organ dysfunction: Secondary | ICD-10-CM | POA: Diagnosis not present

## 2016-05-18 DIAGNOSIS — Z794 Long term (current) use of insulin: Secondary | ICD-10-CM

## 2016-05-18 DIAGNOSIS — L89314 Pressure ulcer of right buttock, stage 4: Secondary | ICD-10-CM | POA: Diagnosis not present

## 2016-05-18 DIAGNOSIS — I120 Hypertensive chronic kidney disease with stage 5 chronic kidney disease or end stage renal disease: Secondary | ICD-10-CM | POA: Diagnosis not present

## 2016-05-18 DIAGNOSIS — B961 Klebsiella pneumoniae [K. pneumoniae] as the cause of diseases classified elsewhere: Secondary | ICD-10-CM | POA: Diagnosis not present

## 2016-05-18 DIAGNOSIS — F329 Major depressive disorder, single episode, unspecified: Secondary | ICD-10-CM | POA: Diagnosis not present

## 2016-05-18 DIAGNOSIS — N189 Chronic kidney disease, unspecified: Secondary | ICD-10-CM | POA: Diagnosis not present

## 2016-05-18 DIAGNOSIS — L89159 Pressure ulcer of sacral region, unspecified stage: Secondary | ICD-10-CM

## 2016-05-18 DIAGNOSIS — K920 Hematemesis: Secondary | ICD-10-CM | POA: Diagnosis not present

## 2016-05-18 DIAGNOSIS — L89322 Pressure ulcer of left buttock, stage 2: Secondary | ICD-10-CM | POA: Diagnosis not present

## 2016-05-18 DIAGNOSIS — Z992 Dependence on renal dialysis: Secondary | ICD-10-CM | POA: Diagnosis not present

## 2016-05-18 DIAGNOSIS — R1312 Dysphagia, oropharyngeal phase: Secondary | ICD-10-CM | POA: Diagnosis not present

## 2016-05-18 DIAGNOSIS — J80 Acute respiratory distress syndrome: Secondary | ICD-10-CM | POA: Diagnosis not present

## 2016-05-18 DIAGNOSIS — N186 End stage renal disease: Secondary | ICD-10-CM | POA: Diagnosis not present

## 2016-05-18 DIAGNOSIS — Z87440 Personal history of urinary (tract) infections: Secondary | ICD-10-CM | POA: Diagnosis not present

## 2016-05-18 DIAGNOSIS — E1122 Type 2 diabetes mellitus with diabetic chronic kidney disease: Secondary | ICD-10-CM | POA: Diagnosis not present

## 2016-05-18 DIAGNOSIS — I129 Hypertensive chronic kidney disease with stage 1 through stage 4 chronic kidney disease, or unspecified chronic kidney disease: Secondary | ICD-10-CM | POA: Diagnosis not present

## 2016-05-18 DIAGNOSIS — R488 Other symbolic dysfunctions: Secondary | ICD-10-CM | POA: Diagnosis not present

## 2016-05-18 DIAGNOSIS — M6281 Muscle weakness (generalized): Secondary | ICD-10-CM | POA: Diagnosis not present

## 2016-05-18 DIAGNOSIS — R531 Weakness: Secondary | ICD-10-CM | POA: Diagnosis not present

## 2016-05-18 DIAGNOSIS — L89214 Pressure ulcer of right hip, stage 4: Secondary | ICD-10-CM | POA: Diagnosis not present

## 2016-05-18 DIAGNOSIS — M869 Osteomyelitis, unspecified: Secondary | ICD-10-CM | POA: Diagnosis not present

## 2016-05-18 DIAGNOSIS — A4901 Methicillin susceptible Staphylococcus aureus infection, unspecified site: Secondary | ICD-10-CM | POA: Diagnosis not present

## 2016-05-18 DIAGNOSIS — J189 Pneumonia, unspecified organism: Secondary | ICD-10-CM | POA: Diagnosis not present

## 2016-05-18 DIAGNOSIS — L89152 Pressure ulcer of sacral region, stage 2: Secondary | ICD-10-CM | POA: Diagnosis not present

## 2016-05-18 DIAGNOSIS — Z23 Encounter for immunization: Secondary | ICD-10-CM | POA: Diagnosis not present

## 2016-05-18 DIAGNOSIS — L89220 Pressure ulcer of left hip, unstageable: Secondary | ICD-10-CM | POA: Diagnosis not present

## 2016-05-18 DIAGNOSIS — G8220 Paraplegia, unspecified: Secondary | ICD-10-CM | POA: Diagnosis not present

## 2016-05-18 DIAGNOSIS — D631 Anemia in chronic kidney disease: Secondary | ICD-10-CM | POA: Diagnosis not present

## 2016-05-18 DIAGNOSIS — D638 Anemia in other chronic diseases classified elsewhere: Secondary | ICD-10-CM | POA: Diagnosis not present

## 2016-05-18 LAB — CBC
HCT: 24.4 % — ABNORMAL LOW (ref 39.0–52.0)
Hemoglobin: 7.9 g/dL — ABNORMAL LOW (ref 13.0–17.0)
MCH: 31 pg (ref 26.0–34.0)
MCHC: 32.4 g/dL (ref 30.0–36.0)
MCV: 95.7 fL (ref 78.0–100.0)
PLATELETS: 325 10*3/uL (ref 150–400)
RBC: 2.55 MIL/uL — ABNORMAL LOW (ref 4.22–5.81)
RDW: 16 % — AB (ref 11.5–15.5)
WBC: 9.7 10*3/uL (ref 4.0–10.5)

## 2016-05-18 LAB — RENAL FUNCTION PANEL
Albumin: 1.5 g/dL — ABNORMAL LOW (ref 3.5–5.0)
Anion gap: 12 (ref 5–15)
BUN: 40 mg/dL — AB (ref 6–20)
CHLORIDE: 97 mmol/L — AB (ref 101–111)
CO2: 25 mmol/L (ref 22–32)
CREATININE: 4.74 mg/dL — AB (ref 0.61–1.24)
Calcium: 9 mg/dL (ref 8.9–10.3)
GFR calc Af Amer: 13 mL/min — ABNORMAL LOW (ref >60)
GFR calc non Af Amer: 11 mL/min — ABNORMAL LOW (ref >60)
GLUCOSE: 112 mg/dL — AB (ref 65–99)
Phosphorus: 6 mg/dL — ABNORMAL HIGH (ref 2.5–4.6)
Potassium: 4.8 mmol/L (ref 3.5–5.1)
SODIUM: 134 mmol/L — AB (ref 135–145)

## 2016-05-18 LAB — GLUCOSE, CAPILLARY
GLUCOSE-CAPILLARY: 89 mg/dL (ref 65–99)
Glucose-Capillary: 108 mg/dL — ABNORMAL HIGH (ref 65–99)
Glucose-Capillary: 136 mg/dL — ABNORMAL HIGH (ref 65–99)
Glucose-Capillary: 159 mg/dL — ABNORMAL HIGH (ref 65–99)

## 2016-05-18 MED ORDER — SODIUM CHLORIDE 0.9 % IV SOLN: 500.0000 mg | Freq: Every day | INTRAVENOUS | 0 refills | Status: DC

## 2016-05-18 MED ORDER — VANCOMYCIN HCL IN DEXTROSE 750-5 MG/150ML-% IV SOLN
INTRAVENOUS | Status: AC
  Administered 2016-05-18: 750 mg via INTRAVENOUS
  Filled 2016-05-18: qty 150

## 2016-05-18 MED ORDER — VANCOMYCIN HCL IN DEXTROSE 750-5 MG/150ML-% IV SOLN: 750.0000 mg | INTRAVENOUS | 0 refills | Status: DC

## 2016-05-18 MED ORDER — FENTANYL CITRATE (PF) 100 MCG/2ML IJ SOLN: 25.0000 ug | INTRAMUSCULAR | Status: DC | PRN

## 2016-05-18 NOTE — Progress Notes (Signed)
Clinical Social Worker facilitated patient discharge including contacting patient family and facility to confirm patient discharge plans.  Clinical information faxed to facility and family agreeable with plan.  CSW arranged ambulance transport via [facility] to [facility] .  RN Gwen to call [phone] for report prior to discharge.  Clinical Social Worker will sign off for now as social work intervention is no longer needed. Please consult [place] again if new need arises.  Rhea Pink, MSW, [place]

## 2016-05-18 NOTE — Discharge Summary (Addendum)
Physician Discharge Summary  Lance [name] YQM:[id number] [name]: [birth year] DOA: 05/09/2016  PCP: Lance Cobble, MD  Admit date: 05/09/2016 Discharge date: 05/18/2016  Admitted From: SNF Disposition:  SNF  Recommendations for Outpatient Follow-up:  1. Continue Vancomycin with HD for 2 additional doses, 1/17 and 1/19 to complete 14 day course for MRSA UTI 2. Continue Meropenem 500 mg daily for 8 additional days to complete a 14 day course for ESBL Klebsiella UTI 3. Antibiotics at SNF via peripheral IV Line. Keep PIV on discharge and discontinue once antibiotics are finished.    Discharge Condition: stable CODE STATUS: DNR Diet recommendation: renal  HPI: Per Dr. Aggie [name], Lance [name] is a 75 y.o. male  with past mental history significant for congestive heart failure end-stage renal disease recurrent UTIs and multiple sclerosis presents emergency room from [facility] living [facility] rehabilitation with complaints of vomiting, diarrhea, shortness of breath. Patient was brought by EMS after was found out that his O2 saturation was 77%. Of note patient did miss his dialysis treatment yesterday. Patient reportedly had some nausea and vomiting yesterday. The physician at his facility was working him up for urinary tract infection. He had not been given any antibiotics. Patient reports that with his episodes of vomiting there was some blood. ED course: Patient started on vancomycin and Zosyn. Patient placed on BiPAP due to hypoxia. Patient had a bloody melanotic stool. Patient also appeared to have blood on UA and in bedside urinal. Report given that patient was on heparin at his facility to prevent DVTs. INR was normal and initial lactic acid was normal.  Hospital Course: Discharge Diagnoses:  Principal Problem:   Respiratory failure ([place]) Active Problems:   Sacral decubitus ulcer   DM (diabetes mellitus) type II controlled with renal manifestation (HCC)   Depression   SIRS (systemic  inflammatory response syndrome) (HCC)   Melena   Hematemesis   Sepsis secondary to left lower lobe pneumonia and Klebsiella and MRSA UTI with urinary catheter - Sepsis criteria met on admission with fever, tachycardia, tachypnea, hypotension, hypoxia, leukocytosis. Source of infection likely Klebsiella and MRSA UTI as well as pneumonia, he was initially on Vancomycin and Cefepime, his cefepime was changed to Meropenem when Klebsiella speciated as ESBL. He improved, sepsis physiology resolved and returned to baseline. Continue Vancomycin with HD for 2 additional doses and Meropenem for 8 additional days to complete 14 day course for complicated UTI.  Hematemesis - resolved, h/h stable. Likely MW tear due to retching in the setting of nausea/vomiting from UTI End stage renal disease on HD - HD MWF Anemia of chronic disease  - Monitor CBC. On IV iron during dialysis. DM (diabetes mellitus) type II controlled with renal manifestation with long term insulin use (HCC) Depression - Continue Zoloft  Essential hypertension - Continue carvedilol  Stage 4 right ischium pressure ulcer / stage 3 left ischium pressure ulcer - Wound care consult assessment appreciated. Continue dressing change. Patient will benefit from outpatient wound care evaluation and treatment. Moderate malnutrition in the context of chronic illnes   Discharge Instructions   Allergies as of 05/18/2016      Reactions   No Known Allergies       Medication List    TAKE these medications   acetaminophen 325 MG tablet Commonly known as:  TYLENOL Take 650 mg by mouth every 6 (six) hours as needed.   aspirin 81 MG tablet Take 1 tablet (81 mg total) by mouth daily.   AURYXIA 1 GM 210  MG(Fe) tablet Generic drug:  ferric citrate Take 420 mg by mouth daily before breakfast.   b complex-vitamin c-folic acid 0.8 MG Tabs tablet Take 1 tablet by mouth at bedtime.   baclofen 10 MG tablet Commonly known as:  LIORESAL Take 10 mg by  mouth at bedtime as needed for muscle spasms.   carvedilol 3.125 MG tablet Commonly known as:  COREG Take 3.125 mg by mouth 2 (two) times daily with a meal.   famotidine 20 MG tablet Commonly known as:  PEPCID Take 20 mg by mouth 2 (two) times daily.   feeding supplement (PRO-STAT SUGAR FREE 64) Liqd Take 73m by mouth twice daily for wound healing   ferrous sulfate 325 (65 FE) MG tablet Take 325 mg by mouth 2 (two) times daily with a meal.   furosemide 40 MG tablet Commonly known as:  LASIX Take 40 mg by mouth 2 (two) times daily.   heparin 5000 UNIT/ML injection Inject 5,000 Units into the skin every 8 (eight) hours.   insulin glargine 100 UNIT/ML injection Commonly known as:  LANTUS Inject 12 Units into the skin at bedtime.   Melatonin 3 MG Tabs Take 3 mg by mouth at bedtime.   meropenem 500 mg in sodium chloride 0.9 % 50 mL Inject 500 mg into the vein daily at 8 pm. For 8 additional days to complete 14 days   NEPRO Liqd One can by mouth twice daily for supplement/wound healing   NON FORMULARY Drink 1 can of Nephro Steady twice daily.   oxybutynin 5 MG tablet Commonly known as:  DITROPAN Take 5 mg by mouth 2 (two) times daily.   polyethylene glycol packet Commonly known as:  MIRALAX / GLYCOLAX Take 17 g by mouth daily as needed for mild constipation.   promethazine 25 MG tablet Commonly known as:  PHENERGAN Take 25 mg by mouth every 6 (six) hours as needed for nausea or vomiting.   saccharomyces boulardii 250 MG capsule Commonly known as:  FLORASTOR Take 250 mg by mouth 2 (two) times daily. For 7 days; started on 05-08-15   sertraline 50 MG tablet Commonly known as:  ZOLOFT Take 50 mg by mouth daily.   traMADol 50 MG tablet Commonly known as:  ULTRAM Take 50 mg by mouth every 6 (six) hours as needed.   Vancomycin 750-5 MG/150ML-% Soln Commonly known as:  VANCOCIN Inject 150 mLs (750 mg total) into the vein every Monday, Wednesday, and Friday with  hemodialysis. For 2 additional doses 1/17 and 1/19 with HD, to complete a 14 day course.       Allergies  Allergen Reactions  . No Known Allergies     Consultations:  Nephrology   Procedures/Studies:  HD  [place]  Result Date: 05/09/2016 CLINICAL DATA:  Hematuria.  End-stage renal disease EXAM: RENAL ULTRASOUND COMPARISON:  February 14, 2016. FINDINGS: Right Kidney: Length: 11.9 cm. Echogenicity is increased. Renal cortical thickness is within normal limits. No perinephric fluid or hydronephrosis visualized. There is a somewhat complex cystic mass arising from the upper pole of the right kidney measuring 2.5 x 2.3 x 1.9 cm. No other renal mass evident. No sonographically demonstrable calculus or ureterectasis. Left Kidney: Length: 12.7 cm. Echogenicity is increased. Renal cortical thickness is normal. No mass, perinephric fluid, or hydronephrosis visualized. No sonographically demonstrable calculus or ureterectasis. Bladder: Urinary bladder is decompressed by Foley catheter and cannot be assessed. IMPRESSION: Increased renal echogenicity bilaterally is indicative of medical renal disease. No hydronephrosis seen on either  side. There is a complex cystic mass arising from the upper pole right kidney. There is an area of apparent nodularity within this cyst, raising concern for potential cystic neoplasm. Further evaluation with pre and post contrast MRI or CT nonemergently should be considered. MRI is preferred in younger patients (due to lack of ionizing radiation) and for evaluating calcified lesion(s). Electronically Signed   By: Lowella Grip III M.D.   On: 05/09/2016 21:53   Dg Chest Port 1 View  Result Date: 05/10/2016 CLINICAL DATA:  Hypoxia EXAM: PORTABLE CHEST 1 VIEW COMPARISON:  05/09/2016 FINDINGS: Cardiac shadow is mildly enlarged. Dialysis catheter is again seen. Left basilar opacities are again identified and stable. Mild interstitial changes are noted bilaterally. No new focal  abnormality is seen. IMPRESSION: Stable left basilar changes. Electronically Signed   By: Inez Catalina M.D.   On: 05/10/2016 08:34   Dg Chest Port 1 View  Result Date: 05/09/2016 CLINICAL DATA:  76 year old male with shortness of breath and cough for the past 2 days EXAM: PORTABLE CHEST 1 VIEW COMPARISON:  Prior chest x-ray 02/27/2016 FINDINGS: Stable position of right IJ approach tunneled hemodialysis catheter. The catheter tips overlie the superior cavoatrial junction. Stable borderline cardiomegaly. Slightly increased pulmonary vascular congestion likely reflecting mild interstitial edema. Nonspecific left lower lobe airspace opacity obscures the diaphragm. IMPRESSION: 1. Nonspecific left basilar airspace opacity, likely within the left lower lobe. Differential considerations include pleural fluid with atelectasis and/or infiltrate. Consider PA and lateral chest x-ray for further differentiation. 2. Slightly increased pulmonary vascular congestion now with mild interstitial edema. 3. Stable cardiomegaly. 4. Stable right IJ approach tunneled hemodialysis catheter. Electronically Signed   By: Jacqulynn Cadet M.D.   On: 05/09/2016 11:48      Subjective: - no chest pain, shortness of breath, no abdominal pain, nausea or vomiting.   Discharge Exam: Vitals:   05/18/16 0730 05/18/16 0800  BP: (!) 147/68 135/68  Pulse: 73 83  Resp: 20 (!) 25  Temp:     Vitals:   05/18/16 0715 05/18/16 0720 05/18/16 0730 05/18/16 0800  BP: (!) 145/66 (!) 148/72 (!) 147/68 135/68  Pulse: 79 72 73 83  Resp: 17 (!) 21 20 (!) 25  Temp:      TempSrc:      SpO2:      Weight:      Height:        General: Pt is alert, awake, not in acute distress Cardiovascular: RRR, S1/S2 +, no rubs, no gallops Respiratory: CTA bilaterally, no wheezing, no rhonchi Abdominal: Soft, NT, ND, bowel sounds + Extremities: no edema, no cyanosis    The results of significant diagnostics from this hospitalization (including  imaging, microbiology, ancillary and laboratory) are listed below for reference.     Microbiology: Recent Results (from the past 240 hour(s))  Culture, blood (routine x 2)     Status: None   Collection Time: 05/09/16 11:30 AM  Result Value Ref Range Status   Specimen Description BLOOD LEFT FOREARM  Final   Special Requests BOTTLES DRAWN AEROBIC AND ANAEROBIC  5CC  Final   Culture NO GROWTH 5 DAYS  Final   Report Status 05/14/2016 FINAL  Final  Culture, blood (routine x 2)     Status: None   Collection Time: 05/09/16 11:55 AM  Result Value Ref Range Status   Specimen Description BLOOD LEFT HAND  Final   Special Requests BOTTLES DRAWN AEROBIC AND ANAEROBIC  5CC  Final   Culture NO GROWTH 5  DAYS  Final   Report Status 05/14/2016 FINAL  Final  Urine culture     Status: Abnormal   Collection Time: 05/09/16 12:20 PM  Result Value Ref Range Status   Specimen Description URINE, CLEAN CATCH  Final   Special Requests Normal  Final   Culture (A)  Final    >=100,000 COLONIES/mL KLEBSIELLA PNEUMONIAE Confirmed Extended Spectrum Beta-Lactamase Producer (ESBL) >=100,000 COLONIES/mL METHICILLIN RESISTANT STAPHYLOCOCCUS AUREUS    Report Status 05/12/2016 FINAL  Final   Organism ID, Bacteria KLEBSIELLA PNEUMONIAE (A)  Final   Organism ID, Bacteria METHICILLIN RESISTANT STAPHYLOCOCCUS AUREUS (A)  Final      Susceptibility   Klebsiella pneumoniae - MIC*    AMPICILLIN >=32 RESISTANT Resistant     CEFAZOLIN >=64 RESISTANT Resistant     CEFTRIAXONE <=1 RESISTANT Resistant     CIPROFLOXACIN 2 INTERMEDIATE Intermediate     GENTAMICIN <=1 SENSITIVE Sensitive     IMIPENEM <=0.25 SENSITIVE Sensitive     NITROFURANTOIN >=512 RESISTANT Resistant     TRIMETH/SULFA >=320 RESISTANT Resistant     AMPICILLIN/SULBACTAM >=32 RESISTANT Resistant     PIP/TAZO 32 RESISTANT Resistant     Extended ESBL POSITIVE Resistant     * >=100,000 COLONIES/mL KLEBSIELLA PNEUMONIAE   Methicillin resistant staphylococcus  aureus - MIC*    CIPROFLOXACIN >=8 RESISTANT Resistant     GENTAMICIN <=0.5 SENSITIVE Sensitive     NITROFURANTOIN <=16 SENSITIVE Sensitive     OXACILLIN >=4 RESISTANT Resistant     TETRACYCLINE <=1 SENSITIVE Sensitive     VANCOMYCIN <=0.5 SENSITIVE Sensitive     TRIMETH/SULFA >=320 RESISTANT Resistant     CLINDAMYCIN <=0.25 SENSITIVE Sensitive     RIFAMPIN <=0.5 SENSITIVE Sensitive     Inducible Clindamycin NEGATIVE Sensitive     * >=100,000 COLONIES/mL METHICILLIN RESISTANT STAPHYLOCOCCUS AUREUS  MRSA PCR Screening     Status: Abnormal   Collection Time: 05/09/16  8:06 PM  Result Value Ref Range Status   MRSA by PCR POSITIVE (A) NEGATIVE Final    Comment:        The GeneXpert MRSA Assay (FDA approved for NASAL specimens only), is one component of a comprehensive MRSA colonization surveillance program. It is not intended to diagnose MRSA infection nor to guide or monitor treatment for MRSA infections. RESULT CALLED TO, READ BACK BY AND VERIFIED WITH: NEWELL,M RN 2248 05/09/16 MITCHELL,L      Labs: BNP (last 3 results)  Recent Labs  02/14/16 0020 05/10/16 0410  BNP 927.0* 038.3*   Basic Metabolic Panel:  Recent Labs Lab 05/12/16 0615 05/14/16 0541 05/15/16 1919  NA 133* 137 134*  K 4.4 3.8 4.0  CL 99* 100* 96*  CO2 '23 26 27  ' GLUCOSE 109* 145* 156*  BUN 30* 21* 9  CREATININE 3.02* 2.86* 1.67*  CALCIUM 8.4* 8.7* 8.1*  MG 1.9  --   --   PHOS  --   --  1.5*   Liver Function Tests:  Recent Labs Lab 05/15/16 1919  ALBUMIN 1.7*   No results for input(s): LIPASE, AMYLASE in the last 168 hours. No results for input(s): AMMONIA in the last 168 hours. CBC:  Recent Labs Lab 05/12/16 0615 05/13/16 0607 05/14/16 0541 05/15/16 1919  WBC 13.9* 10.7* 12.1* 8.1  HGB 8.2* 8.0* 7.9* 8.0*  HCT 25.6* 24.1* 25.1* 24.7*  MCV 97.7 98.0 97.7 95.4  PLT 202 216 256 278   Cardiac Enzymes: No results for input(s): CKTOTAL, CKMB, CKMBINDEX, TROPONINI in the last 168  hours.  BNP: Invalid input(s): POCBNP CBG:  Recent Labs Lab 05/17/16 1145 05/17/16 1625 05/17/16 2038 05/18/16 0026 05/18/16 0420  GLUCAP 196* 113* 146* 159* 89   D-Dimer No results for input(s): DDIMER in the last 72 hours. Hgb A1c No results for input(s): HGBA1C in the last 72 hours. Lipid Profile No results for input(s): CHOL, HDL, LDLCALC, TRIG, CHOLHDL, LDLDIRECT in the last 72 hours. Thyroid function studies No results for input(s): TSH, T4TOTAL, T3FREE, THYROIDAB in the last 72 hours.  Invalid input(s): FREET3 Anemia work up No results for input(s): VITAMINB12, FOLATE, FERRITIN, TIBC, IRON, RETICCTPCT in the last 72 hours. Urinalysis    Component Value Date/Time   COLORURINE AMBER (A) 05/09/2016 1220   APPEARANCEUR CLOUDY (A) 05/09/2016 1220   LABSPEC 1.012 05/09/2016 1220   PHURINE 8.0 05/09/2016 1220   GLUCOSEU NEGATIVE 05/09/2016 1220   HGBUR LARGE (A) 05/09/2016 1220   BILIRUBINUR NEGATIVE 05/09/2016 1220   KETONESUR NEGATIVE 05/09/2016 1220   PROTEINUR 100 (A) 05/09/2016 1220   UROBILINOGEN 0.2 08/30/2014 2203   NITRITE NEGATIVE 05/09/2016 1220   LEUKOCYTESUR SMALL (A) 05/09/2016 1220   Sepsis Labs Invalid input(s): PROCALCITONIN,  WBC,  LACTICIDVEN Microbiology Recent Results (from the past 240 hour(s))  Culture, blood (routine x 2)     Status: None   Collection Time: 05/09/16 11:30 AM  Result Value Ref Range Status   Specimen Description BLOOD LEFT FOREARM  Final   Special Requests BOTTLES DRAWN AEROBIC AND ANAEROBIC  5CC  Final   Culture NO GROWTH 5 DAYS  Final   Report Status 05/14/2016 FINAL  Final  Culture, blood (routine x 2)     Status: None   Collection Time: 05/09/16 11:55 AM  Result Value Ref Range Status   Specimen Description BLOOD LEFT HAND  Final   Special Requests BOTTLES DRAWN AEROBIC AND ANAEROBIC  5CC  Final   Culture NO GROWTH 5 DAYS  Final   Report Status 05/14/2016 FINAL  Final  Urine culture     Status: Abnormal    Collection Time: 05/09/16 12:20 PM  Result Value Ref Range Status   Specimen Description URINE, CLEAN CATCH  Final   Special Requests Normal  Final   Culture (A)  Final    >=100,000 COLONIES/mL KLEBSIELLA PNEUMONIAE Confirmed Extended Spectrum Beta-Lactamase Producer (ESBL) >=100,000 COLONIES/mL METHICILLIN RESISTANT STAPHYLOCOCCUS AUREUS    Report Status 05/12/2016 FINAL  Final   Organism ID, Bacteria KLEBSIELLA PNEUMONIAE (A)  Final   Organism ID, Bacteria METHICILLIN RESISTANT STAPHYLOCOCCUS AUREUS (A)  Final      Susceptibility   Klebsiella pneumoniae - MIC*    AMPICILLIN >=32 RESISTANT Resistant     CEFAZOLIN >=64 RESISTANT Resistant     CEFTRIAXONE <=1 RESISTANT Resistant     CIPROFLOXACIN 2 INTERMEDIATE Intermediate     GENTAMICIN <=1 SENSITIVE Sensitive     IMIPENEM <=0.25 SENSITIVE Sensitive     NITROFURANTOIN >=512 RESISTANT Resistant     TRIMETH/SULFA >=320 RESISTANT Resistant     AMPICILLIN/SULBACTAM >=32 RESISTANT Resistant     PIP/TAZO 32 RESISTANT Resistant     Extended ESBL POSITIVE Resistant     * >=100,000 COLONIES/mL KLEBSIELLA PNEUMONIAE   Methicillin resistant staphylococcus aureus - MIC*    CIPROFLOXACIN >=8 RESISTANT Resistant     GENTAMICIN <=0.5 SENSITIVE Sensitive     NITROFURANTOIN <=16 SENSITIVE Sensitive     OXACILLIN >=4 RESISTANT Resistant     TETRACYCLINE <=1 SENSITIVE Sensitive     VANCOMYCIN <=0.5 SENSITIVE Sensitive     TRIMETH/SULFA >=  320 RESISTANT Resistant     CLINDAMYCIN <=0.25 SENSITIVE Sensitive     RIFAMPIN <=0.5 SENSITIVE Sensitive     Inducible Clindamycin NEGATIVE Sensitive     * >=100,000 COLONIES/mL METHICILLIN RESISTANT STAPHYLOCOCCUS AUREUS  MRSA PCR Screening     Status: Abnormal   Collection Time: 05/09/16  8:06 PM  Result Value Ref Range Status   MRSA by PCR POSITIVE (A) NEGATIVE Final    Comment:        The GeneXpert MRSA Assay (FDA approved for NASAL specimens only), is one component of a comprehensive MRSA  colonization surveillance program. It is not intended to diagnose MRSA infection nor to guide or monitor treatment for MRSA infections. RESULT CALLED TO, READ BACK BY AND VERIFIED WITH: NEWELL,M RN 2248 05/09/16 MITCHELL,L     Time coordinating discharge: Over 30 minutes  SIGNED:  Marzetta Board, MD  [facility] 05/18/2016, 8:49 AM Pager [phone]  If 7PM-7AM, please contact night-coverage [email or web address] Password [email or web address]

## 2016-05-18 NOTE — Clinical Social Work Note (Signed)
CSW notified by Suanne Marker @ [facility] pt's bed will be available today, and pharmacist has questions about pt's meds. CSW notified pt's nurse to advise and provided contact information for [facility] for clarification. If Suanne Marker is not available ok for nurse to call Pharmacist @ [phone] Opt 1 and then Opt 0.   CSW will continue to follow and coordinate pt's transfer back to [facility] today.  Deckard Stuber B. Joline Maxcy Clinical Social Work Dept Weekend Social Worker [phone] 9:12 AM

## 2016-05-18 NOTE — Procedures (Signed)
I have seen and examined this patient and agree with the plan of care   Ext bilat protective boots, R foot contracted R IJ cath/ R FA AVF+bruit  1. UTI MRSA + ESBL Klebsiella - on IV vanc/ Maxipime, per primary 2. MS/ quadriplegic/ NGB - indwelling foley  Some melena stool   No heparin   K  4.8    CO 2  23     Alb 1.5     Ca  9  Phos 6.0   Hb 8.0   Lance [name] W 05/18/2016, 10:41 AM

## 2016-05-18 NOTE — Consult Note (Signed)
   [facility] CM Inpatient Consult   05/18/2016  Lance [name] 1940/09/14 [id number]  Follow up note:  Patient is discharging to a skilled nursing facility. Will update [facility] staff of disposition.  For questions, please contact:  Natividad Brood, RN BSN [facility] Liaison  [phone] business mobile phone Toll free office [phone]

## 2016-05-20 ENCOUNTER — Non-Acute Institutional Stay (SKILLED_NURSING_FACILITY): Payer: Medicare Other | Admitting: Internal Medicine

## 2016-05-20 ENCOUNTER — Encounter: Payer: Self-pay | Admitting: Internal Medicine

## 2016-05-20 DIAGNOSIS — D509 Iron deficiency anemia, unspecified: Secondary | ICD-10-CM | POA: Diagnosis not present

## 2016-05-20 DIAGNOSIS — N186 End stage renal disease: Secondary | ICD-10-CM | POA: Diagnosis not present

## 2016-05-20 DIAGNOSIS — Z23 Encounter for immunization: Secondary | ICD-10-CM | POA: Diagnosis not present

## 2016-05-20 DIAGNOSIS — N2581 Secondary hyperparathyroidism of renal origin: Secondary | ICD-10-CM | POA: Diagnosis not present

## 2016-05-20 DIAGNOSIS — N390 Urinary tract infection, site not specified: Secondary | ICD-10-CM | POA: Diagnosis not present

## 2016-05-20 DIAGNOSIS — D631 Anemia in chronic kidney disease: Secondary | ICD-10-CM | POA: Diagnosis not present

## 2016-05-20 DIAGNOSIS — J189 Pneumonia, unspecified organism: Secondary | ICD-10-CM

## 2016-05-20 DIAGNOSIS — K920 Hematemesis: Secondary | ICD-10-CM

## 2016-05-20 DIAGNOSIS — E1129 Type 2 diabetes mellitus with other diabetic kidney complication: Secondary | ICD-10-CM | POA: Diagnosis not present

## 2016-05-20 NOTE — Patient Instructions (Addendum)
See Current Assessment & Plan under specific Diagnosis. Total time 40  minutes; greater than 50% of the visit spent counseling patient and coordinating care for problems addressed at this encounter

## 2016-05-20 NOTE — Progress Notes (Signed)
This is a nursing facility follow up for [place] readmission within 30 days  Interim medical record and care since last [place] visit was updated with review of diagnostic studies and change in clinical status since last visit were documented.  HPI: The patient was hospitalized with urosepsis 1/6-1/15/18. He was sent to the emergency room from the SNF because of desaturation with O2 sats of 77% on room air. He was experiencing vomiting, diarrhea, and dyspnea. The patient also had a bloody melanotic stool and hematemesis. He was on subcutaneous heparin every 8 hours as DVT prophylaxis. Left lower lobe pneumonia was documented on x-ray. Urine culture isolated Klebsiella and MRSA. Because the Klebsiella was ESBL cefepime was changed to meropenem Pending at the time of admission was a urine culture collected 1/4.It subsequently returned 05/12/16 revealing Klebsiella which was uniformly resistant to oral medications except possibly Cipro. The MRSA had not been isolated on the outpatient culture At the SNF he was to continue vancomycin  Monday, Wednesday, Friday with hemodialysis. Full 14 day course will be completed as of 1/19 dose. Meropenem 500 mg was to be given daily at 8 PM 1/15-1/23/18. He has chronic stage IV right ischial pressure ulcer & stage 3 left ischial pressure ulcer. Wound care consult was completed. He tentatively has an appointment at [facility] to consider grafts for these lesions. He continues dialysis 3 days a week.  Review of systems: He denies significant active symptoms at this time. Specifically he has no cardiopulmonary, GI, GU, or hematologic symptoms.  Constitutional: No fever,significant weight change, fatigue  Eyes: No redness, discharge, pain, vision change ENT/mouth: No nasal congestion,  purulent discharge, earache,change in hearing ,sore throat  Cardiovascular: No chest pain, palpitations,paroxysmal nocturnal dyspnea,  claudication, edema  Respiratory: No cough, sputum production,hemoptysis, DOE , significant snoring,apnea  Gastrointestinal: No heartburn,dysphagia,abdominal pain, nausea / vomiting,rectal bleeding, melena,change in bowels Genitourinary: No dysuria,hematuria, pyuria,  incontinence, nocturia Musculoskeletal: No joint stiffness, joint swelling, weakness,pain Dermatologic: No rash, pruritus, change in appearance of skin Neurologic: No dizziness,headache,syncope, seizures, numbness , tingling Endocrine: No change in hair/skin/ nails, excessive thirst, excessive hunger, excessive urination  Hematologic/lymphatic: No significant bruising, lymphadenopathy,abnormal bleeding Allergy/immunology: No itchy/ watery eyes, significant sneezing, urticaria, angioedema  Physical exam:  Pertinent or positive findings: Pattern alopecia is present. A boss is present over the right forehead. He has temporal wasting. His voice is gravelly but this is a constant. He was sitting in the wheelchair allowing better auscultation of the chest than on prior exams. His inspirations are shallow. He has minimal rales at the bases. Heart sounds are distant. Posterior tibial pulses are decreased. The dorsalis pedis pulses are strong. He has essentially no movement of the lower extremities. His feet remain in foam booties. Upper extremities are also weak. He has interosseous muscle wasting. He has scattered bruising over the forearms.  General appearance:Adequately nourished; no acute distress , increased work of breathing is present.   Lymphatic: No lymphadenopathy about the head, neck, axilla . Eyes: No conjunctival inflammation or lid edema is present. There is no scleral icterus. Ears:  External ear exam shows no significant lesions or deformities.   Nose:  External nasal examination shows no deformity or inflammation. Nasal mucosa are pink and moist without lesions ,exudates Oral exam: lips and gums are healthy appearing.There  is no oropharyngeal erythema or exudate . Neck:  No thyromegaly, masses, tenderness noted.    Heart:  Normal rate and regular rhythm. S1 and S2 normal without  gallop, murmur, click, rub .  Abdomen:Bowel sounds are normal. Abdomen is soft and nontender with no organomegaly, hernias,masses. GU: deferred ;Foley in place Extremities:  No cyanosis, clubbing,edema  Skin: Warm & dry w/o tenting. No significant rash.    See plan under each active problem in the Diagnosis List

## 2016-05-20 NOTE — Assessment & Plan Note (Signed)
In context of nausea and vomiting with urosepsis Subcutaneous heparin every 8 hours as DVT prophylaxis No past history of DVT or PTE Would novel oral anticoaguant be safer if cost not prohibitive (consult Pharmacy)

## 2016-05-21 ENCOUNTER — Encounter: Payer: Self-pay | Admitting: Vascular Surgery

## 2016-05-22 DIAGNOSIS — E1129 Type 2 diabetes mellitus with other diabetic kidney complication: Secondary | ICD-10-CM | POA: Diagnosis not present

## 2016-05-22 DIAGNOSIS — Z23 Encounter for immunization: Secondary | ICD-10-CM | POA: Diagnosis not present

## 2016-05-22 DIAGNOSIS — D509 Iron deficiency anemia, unspecified: Secondary | ICD-10-CM | POA: Diagnosis not present

## 2016-05-22 DIAGNOSIS — N2581 Secondary hyperparathyroidism of renal origin: Secondary | ICD-10-CM | POA: Diagnosis not present

## 2016-05-22 DIAGNOSIS — D631 Anemia in chronic kidney disease: Secondary | ICD-10-CM | POA: Diagnosis not present

## 2016-05-22 DIAGNOSIS — N186 End stage renal disease: Secondary | ICD-10-CM | POA: Diagnosis not present

## 2016-05-25 DIAGNOSIS — E1129 Type 2 diabetes mellitus with other diabetic kidney complication: Secondary | ICD-10-CM | POA: Diagnosis not present

## 2016-05-25 DIAGNOSIS — Z23 Encounter for immunization: Secondary | ICD-10-CM | POA: Diagnosis not present

## 2016-05-25 DIAGNOSIS — D509 Iron deficiency anemia, unspecified: Secondary | ICD-10-CM | POA: Diagnosis not present

## 2016-05-25 DIAGNOSIS — N186 End stage renal disease: Secondary | ICD-10-CM | POA: Diagnosis not present

## 2016-05-25 DIAGNOSIS — N2581 Secondary hyperparathyroidism of renal origin: Secondary | ICD-10-CM | POA: Diagnosis not present

## 2016-05-25 DIAGNOSIS — D631 Anemia in chronic kidney disease: Secondary | ICD-10-CM | POA: Diagnosis not present

## 2016-05-25 LAB — BASIC METABOLIC PANEL: Creatinine: 5.9 mg/dL — AB (ref 0.6–1.3)

## 2016-05-25 NOTE — Progress Notes (Signed)
    Postoperative Access Visit   History of Present Illness  Lance [name] is a 76 y.o. year old male who presents for postoperative follow-up for: R RC AVF (Date: 03/24/16).  The patient's wounds are healed.  The patient notes no steal symptoms.  The patient is able to complete their activities of daily living.  The patient's current symptoms are: unrelated to access.  For VQI Use Only  PRE-ADM LIVING: Nursing home  AMB STATUS: Wheelchair  Physical Examination Vitals:   05/27/16 0847  BP: (!) 145/59  Pulse: 72  Resp: 18  Temp: (!) 96.7 F (35.9 C)    RUE: Incision is healed, skin feels warm, hand grip is 5/5, sensation in digits is intact, palpable thrill, bruit can be auscultated, on Sonosite: 5-5.5 mm, shallow fistula, palpable radial  Medical Decision Making  Lance [name] is a 76 y.o. year old male who presents s/p R RC AVF .  The patient's access is NOT ready for use.  Will check maturation in one month.  If not ready, will proceed with balloon assisted maturation procedure.  Thank you for allowing [place] to participate in this patient's care.  Adele Barthel, MD, FACS [facility] [place] Office: [phone] Pager: [phone]

## 2016-05-26 DIAGNOSIS — E119 Type 2 diabetes mellitus without complications: Secondary | ICD-10-CM | POA: Diagnosis not present

## 2016-05-26 DIAGNOSIS — N183 Chronic kidney disease, stage 3 (moderate): Secondary | ICD-10-CM | POA: Diagnosis not present

## 2016-05-26 DIAGNOSIS — I129 Hypertensive chronic kidney disease with stage 1 through stage 4 chronic kidney disease, or unspecified chronic kidney disease: Secondary | ICD-10-CM | POA: Diagnosis not present

## 2016-05-26 DIAGNOSIS — G8220 Paraplegia, unspecified: Secondary | ICD-10-CM | POA: Diagnosis not present

## 2016-05-26 DIAGNOSIS — Z992 Dependence on renal dialysis: Secondary | ICD-10-CM | POA: Diagnosis not present

## 2016-05-26 DIAGNOSIS — L89314 Pressure ulcer of right buttock, stage 4: Secondary | ICD-10-CM | POA: Diagnosis not present

## 2016-05-27 ENCOUNTER — Ambulatory Visit (INDEPENDENT_AMBULATORY_CARE_PROVIDER_SITE_OTHER): Payer: Medicare Other | Admitting: Vascular Surgery

## 2016-05-27 ENCOUNTER — Encounter: Payer: Self-pay | Admitting: Vascular Surgery

## 2016-05-27 ENCOUNTER — Encounter: Payer: Medicare Other | Admitting: Vascular Surgery

## 2016-05-27 VITALS — BP 145/59 | HR 72 | Temp 96.7°F | Resp 18 | Ht 76.0 in | Wt 164.0 lb

## 2016-05-27 DIAGNOSIS — D509 Iron deficiency anemia, unspecified: Secondary | ICD-10-CM | POA: Diagnosis not present

## 2016-05-27 DIAGNOSIS — N186 End stage renal disease: Secondary | ICD-10-CM | POA: Diagnosis not present

## 2016-05-27 DIAGNOSIS — Z23 Encounter for immunization: Secondary | ICD-10-CM | POA: Diagnosis not present

## 2016-05-27 DIAGNOSIS — N2581 Secondary hyperparathyroidism of renal origin: Secondary | ICD-10-CM | POA: Diagnosis not present

## 2016-05-27 DIAGNOSIS — E1129 Type 2 diabetes mellitus with other diabetic kidney complication: Secondary | ICD-10-CM | POA: Diagnosis not present

## 2016-05-27 DIAGNOSIS — D631 Anemia in chronic kidney disease: Secondary | ICD-10-CM | POA: Diagnosis not present

## 2016-05-27 DIAGNOSIS — Z992 Dependence on renal dialysis: Secondary | ICD-10-CM

## 2016-05-28 DIAGNOSIS — L89220 Pressure ulcer of left hip, unstageable: Secondary | ICD-10-CM | POA: Diagnosis not present

## 2016-05-28 DIAGNOSIS — L89214 Pressure ulcer of right hip, stage 4: Secondary | ICD-10-CM | POA: Diagnosis not present

## 2016-05-29 DIAGNOSIS — Z23 Encounter for immunization: Secondary | ICD-10-CM | POA: Diagnosis not present

## 2016-05-29 DIAGNOSIS — D509 Iron deficiency anemia, unspecified: Secondary | ICD-10-CM | POA: Diagnosis not present

## 2016-05-29 DIAGNOSIS — N186 End stage renal disease: Secondary | ICD-10-CM | POA: Diagnosis not present

## 2016-05-29 DIAGNOSIS — N2581 Secondary hyperparathyroidism of renal origin: Secondary | ICD-10-CM | POA: Diagnosis not present

## 2016-05-29 DIAGNOSIS — E1129 Type 2 diabetes mellitus with other diabetic kidney complication: Secondary | ICD-10-CM | POA: Diagnosis not present

## 2016-05-29 DIAGNOSIS — D631 Anemia in chronic kidney disease: Secondary | ICD-10-CM | POA: Diagnosis not present

## 2016-06-01 DIAGNOSIS — Z23 Encounter for immunization: Secondary | ICD-10-CM | POA: Diagnosis not present

## 2016-06-01 DIAGNOSIS — D631 Anemia in chronic kidney disease: Secondary | ICD-10-CM | POA: Diagnosis not present

## 2016-06-01 DIAGNOSIS — N186 End stage renal disease: Secondary | ICD-10-CM | POA: Diagnosis not present

## 2016-06-01 DIAGNOSIS — E1129 Type 2 diabetes mellitus with other diabetic kidney complication: Secondary | ICD-10-CM | POA: Diagnosis not present

## 2016-06-01 DIAGNOSIS — D509 Iron deficiency anemia, unspecified: Secondary | ICD-10-CM | POA: Diagnosis not present

## 2016-06-01 DIAGNOSIS — N2581 Secondary hyperparathyroidism of renal origin: Secondary | ICD-10-CM | POA: Diagnosis not present

## 2016-06-02 ENCOUNTER — Other Ambulatory Visit: Payer: Self-pay | Admitting: *Deleted

## 2016-06-02 DIAGNOSIS — Z87440 Personal history of urinary (tract) infections: Secondary | ICD-10-CM | POA: Diagnosis not present

## 2016-06-02 DIAGNOSIS — N319 Neuromuscular dysfunction of bladder, unspecified: Secondary | ICD-10-CM | POA: Diagnosis not present

## 2016-06-02 DIAGNOSIS — N189 Chronic kidney disease, unspecified: Secondary | ICD-10-CM | POA: Diagnosis not present

## 2016-06-02 NOTE — Patient Outreach (Signed)
Spoke with Cameron Ali, SW at facility.  She states patient has ended with therapy but is being skilled under nursing, he is new to dialysis.  She states that wife will take patient home eventually but no discharge is set for now, she states patient was at facility for 100 days last year but did go home for a few months before returning. She does not think he will remain LTC.  Discussed that patient is eligible for [facility] care management program services, requested that SW let this RNCM know of patient discharge home to review discharge planning needs, she agrees.   Plan:  Will sign off case for now, will consult again closer to discharge.   [name] [name], RN, BSN, [place] [phone]) Business Cell  [phone]) Toll Free Office

## 2016-06-03 DIAGNOSIS — N2581 Secondary hyperparathyroidism of renal origin: Secondary | ICD-10-CM | POA: Diagnosis not present

## 2016-06-03 DIAGNOSIS — Z23 Encounter for immunization: Secondary | ICD-10-CM | POA: Diagnosis not present

## 2016-06-03 DIAGNOSIS — E1129 Type 2 diabetes mellitus with other diabetic kidney complication: Secondary | ICD-10-CM | POA: Diagnosis not present

## 2016-06-03 DIAGNOSIS — N186 End stage renal disease: Secondary | ICD-10-CM | POA: Diagnosis not present

## 2016-06-03 DIAGNOSIS — I120 Hypertensive chronic kidney disease with stage 5 chronic kidney disease or end stage renal disease: Secondary | ICD-10-CM | POA: Diagnosis not present

## 2016-06-03 DIAGNOSIS — D509 Iron deficiency anemia, unspecified: Secondary | ICD-10-CM | POA: Diagnosis not present

## 2016-06-03 DIAGNOSIS — Z992 Dependence on renal dialysis: Secondary | ICD-10-CM | POA: Diagnosis not present

## 2016-06-03 DIAGNOSIS — D631 Anemia in chronic kidney disease: Secondary | ICD-10-CM | POA: Diagnosis not present

## 2016-06-04 DIAGNOSIS — L89220 Pressure ulcer of left hip, unstageable: Secondary | ICD-10-CM | POA: Diagnosis not present

## 2016-06-04 DIAGNOSIS — L89214 Pressure ulcer of right hip, stage 4: Secondary | ICD-10-CM | POA: Diagnosis not present

## 2016-06-05 DIAGNOSIS — N186 End stage renal disease: Secondary | ICD-10-CM | POA: Diagnosis not present

## 2016-06-05 DIAGNOSIS — Z23 Encounter for immunization: Secondary | ICD-10-CM | POA: Diagnosis not present

## 2016-06-05 DIAGNOSIS — D631 Anemia in chronic kidney disease: Secondary | ICD-10-CM | POA: Diagnosis not present

## 2016-06-05 DIAGNOSIS — E1129 Type 2 diabetes mellitus with other diabetic kidney complication: Secondary | ICD-10-CM | POA: Diagnosis not present

## 2016-06-05 DIAGNOSIS — N2581 Secondary hyperparathyroidism of renal origin: Secondary | ICD-10-CM | POA: Diagnosis not present

## 2016-06-08 DIAGNOSIS — Z23 Encounter for immunization: Secondary | ICD-10-CM | POA: Diagnosis not present

## 2016-06-08 DIAGNOSIS — N186 End stage renal disease: Secondary | ICD-10-CM | POA: Diagnosis not present

## 2016-06-08 DIAGNOSIS — D631 Anemia in chronic kidney disease: Secondary | ICD-10-CM | POA: Diagnosis not present

## 2016-06-08 DIAGNOSIS — E1129 Type 2 diabetes mellitus with other diabetic kidney complication: Secondary | ICD-10-CM | POA: Diagnosis not present

## 2016-06-08 DIAGNOSIS — N2581 Secondary hyperparathyroidism of renal origin: Secondary | ICD-10-CM | POA: Diagnosis not present

## 2016-06-10 DIAGNOSIS — N2581 Secondary hyperparathyroidism of renal origin: Secondary | ICD-10-CM | POA: Diagnosis not present

## 2016-06-10 DIAGNOSIS — Z23 Encounter for immunization: Secondary | ICD-10-CM | POA: Diagnosis not present

## 2016-06-10 DIAGNOSIS — D631 Anemia in chronic kidney disease: Secondary | ICD-10-CM | POA: Diagnosis not present

## 2016-06-10 DIAGNOSIS — N186 End stage renal disease: Secondary | ICD-10-CM | POA: Diagnosis not present

## 2016-06-10 DIAGNOSIS — E1129 Type 2 diabetes mellitus with other diabetic kidney complication: Secondary | ICD-10-CM | POA: Diagnosis not present

## 2016-06-11 DIAGNOSIS — L89214 Pressure ulcer of right hip, stage 4: Secondary | ICD-10-CM | POA: Diagnosis not present

## 2016-06-11 DIAGNOSIS — L89220 Pressure ulcer of left hip, unstageable: Secondary | ICD-10-CM | POA: Diagnosis not present

## 2016-06-12 DIAGNOSIS — N186 End stage renal disease: Secondary | ICD-10-CM | POA: Diagnosis not present

## 2016-06-12 DIAGNOSIS — E1129 Type 2 diabetes mellitus with other diabetic kidney complication: Secondary | ICD-10-CM | POA: Diagnosis not present

## 2016-06-12 DIAGNOSIS — D631 Anemia in chronic kidney disease: Secondary | ICD-10-CM | POA: Diagnosis not present

## 2016-06-12 DIAGNOSIS — Z23 Encounter for immunization: Secondary | ICD-10-CM | POA: Diagnosis not present

## 2016-06-12 DIAGNOSIS — N2581 Secondary hyperparathyroidism of renal origin: Secondary | ICD-10-CM | POA: Diagnosis not present

## 2016-06-15 DIAGNOSIS — N186 End stage renal disease: Secondary | ICD-10-CM | POA: Diagnosis not present

## 2016-06-15 DIAGNOSIS — N2581 Secondary hyperparathyroidism of renal origin: Secondary | ICD-10-CM | POA: Diagnosis not present

## 2016-06-15 DIAGNOSIS — Z23 Encounter for immunization: Secondary | ICD-10-CM | POA: Diagnosis not present

## 2016-06-15 DIAGNOSIS — D631 Anemia in chronic kidney disease: Secondary | ICD-10-CM | POA: Diagnosis not present

## 2016-06-15 DIAGNOSIS — E1129 Type 2 diabetes mellitus with other diabetic kidney complication: Secondary | ICD-10-CM | POA: Diagnosis not present

## 2016-06-17 DIAGNOSIS — N186 End stage renal disease: Secondary | ICD-10-CM | POA: Diagnosis not present

## 2016-06-17 DIAGNOSIS — N2581 Secondary hyperparathyroidism of renal origin: Secondary | ICD-10-CM | POA: Diagnosis not present

## 2016-06-17 DIAGNOSIS — Z23 Encounter for immunization: Secondary | ICD-10-CM | POA: Diagnosis not present

## 2016-06-17 DIAGNOSIS — D631 Anemia in chronic kidney disease: Secondary | ICD-10-CM | POA: Diagnosis not present

## 2016-06-17 DIAGNOSIS — E1129 Type 2 diabetes mellitus with other diabetic kidney complication: Secondary | ICD-10-CM | POA: Diagnosis not present

## 2016-06-18 DIAGNOSIS — L89220 Pressure ulcer of left hip, unstageable: Secondary | ICD-10-CM | POA: Diagnosis not present

## 2016-06-18 DIAGNOSIS — L89214 Pressure ulcer of right hip, stage 4: Secondary | ICD-10-CM | POA: Diagnosis not present

## 2016-06-19 ENCOUNTER — Other Ambulatory Visit: Payer: Self-pay | Admitting: *Deleted

## 2016-06-19 DIAGNOSIS — N2581 Secondary hyperparathyroidism of renal origin: Secondary | ICD-10-CM | POA: Diagnosis not present

## 2016-06-19 DIAGNOSIS — D631 Anemia in chronic kidney disease: Secondary | ICD-10-CM | POA: Diagnosis not present

## 2016-06-19 DIAGNOSIS — E1129 Type 2 diabetes mellitus with other diabetic kidney complication: Secondary | ICD-10-CM | POA: Diagnosis not present

## 2016-06-19 DIAGNOSIS — Z23 Encounter for immunization: Secondary | ICD-10-CM | POA: Diagnosis not present

## 2016-06-19 DIAGNOSIS — N186 End stage renal disease: Secondary | ICD-10-CM | POA: Diagnosis not present

## 2016-06-19 NOTE — Patient Outreach (Signed)
[facility]) Care Management  06/19/2016  Lance [name] 01/25/1941 [id number]   Met with patient briefly at facility, he states he is on the way to dialysis.  Gave patient a [facility] care management brochure with RNCM contact.  Spoke with Tillie Rung, SW at facility. She states patient does not have discharge date set but plan is for him to go home with his wife.   Plan to follow up as indicated re: [facility] care management services in the community.  [name] [name], RN, BSN, [place] [phone]) Business Cell  [phone]) Toll Free Office

## 2016-06-22 DIAGNOSIS — N2581 Secondary hyperparathyroidism of renal origin: Secondary | ICD-10-CM | POA: Diagnosis not present

## 2016-06-22 DIAGNOSIS — D631 Anemia in chronic kidney disease: Secondary | ICD-10-CM | POA: Diagnosis not present

## 2016-06-22 DIAGNOSIS — E1129 Type 2 diabetes mellitus with other diabetic kidney complication: Secondary | ICD-10-CM | POA: Diagnosis not present

## 2016-06-22 DIAGNOSIS — Z23 Encounter for immunization: Secondary | ICD-10-CM | POA: Diagnosis not present

## 2016-06-22 DIAGNOSIS — N186 End stage renal disease: Secondary | ICD-10-CM | POA: Diagnosis not present

## 2016-06-23 ENCOUNTER — Encounter: Payer: Self-pay | Admitting: Vascular Surgery

## 2016-06-23 NOTE — Progress Notes (Signed)
    Postoperative Access Visit   History of Present Illness  Lance [name] is a 76 y.o. (Dec 19, 1940) male who presents for postoperative follow-up for: R RC AVF (Date: 03/24/16).  The patient's wounds are healed.  The patient notes no steal symptoms.  The patient is able to complete their activities of daily living.  The patient's current symptoms are: unrelated to access.  For VQI Use Only  PRE-ADM LIVING: Nursing home  AMB STATUS: Wheelchair  Physical Examination  Vitals:   06/26/16 1027  BP: (!) 128/59  Pulse: 70  Resp: 18  Temp: 98.1 F (36.7 C)  SpO2: 98%  Weight: 163 lb (73.9 kg)  Height: 6\' 4"  (1.93 m)    RUE: Incision is healed, skin feels warm, hand grip is 5/5, sensation in digits is intact, palpable thrill, bruit can be auscultated, on Sonosite: 6 mm distal fistula with some segments of proximal 4.5-4.5 mm, shallow fistula, palpable radial   Medical Decision Making  Lance [name] is a 76 y.o. (1940/11/10) male who presents s/p R RC AVF   Given pt's debilitated state, would like to avoid any further interventions  R access duplex next visit to look for side branches  There has been some improvement in diameters since last visit, if fistula is not fully mature next time will proceed with BAM vs side branch ligation.  Thank you for allowing [place] to participate in this patient's care.   Adele Barthel, MD, FACS [facility] [place] Office: [phone] Pager: [phone]

## 2016-06-24 ENCOUNTER — Non-Acute Institutional Stay (SKILLED_NURSING_FACILITY): Payer: Medicare Other | Admitting: Nurse Practitioner

## 2016-06-24 DIAGNOSIS — N186 End stage renal disease: Secondary | ICD-10-CM | POA: Diagnosis not present

## 2016-06-24 DIAGNOSIS — E1122 Type 2 diabetes mellitus with diabetic chronic kidney disease: Secondary | ICD-10-CM | POA: Diagnosis not present

## 2016-06-24 DIAGNOSIS — L89310 Pressure ulcer of right buttock, unstageable: Secondary | ICD-10-CM

## 2016-06-24 DIAGNOSIS — Z992 Dependence on renal dialysis: Secondary | ICD-10-CM

## 2016-06-24 DIAGNOSIS — N2581 Secondary hyperparathyroidism of renal origin: Secondary | ICD-10-CM | POA: Diagnosis not present

## 2016-06-24 DIAGNOSIS — G35 Multiple sclerosis: Secondary | ICD-10-CM | POA: Diagnosis not present

## 2016-06-24 DIAGNOSIS — N183 Type 2 diabetes mellitus with diabetic chronic kidney disease: Secondary | ICD-10-CM

## 2016-06-24 DIAGNOSIS — N390 Urinary tract infection, site not specified: Secondary | ICD-10-CM | POA: Diagnosis not present

## 2016-06-24 DIAGNOSIS — G35D Multiple sclerosis, unspecified: Secondary | ICD-10-CM

## 2016-06-24 DIAGNOSIS — Z23 Encounter for immunization: Secondary | ICD-10-CM | POA: Diagnosis not present

## 2016-06-24 DIAGNOSIS — D631 Anemia in chronic kidney disease: Secondary | ICD-10-CM | POA: Diagnosis not present

## 2016-06-24 DIAGNOSIS — E1129 Type 2 diabetes mellitus with other diabetic kidney complication: Secondary | ICD-10-CM | POA: Diagnosis not present

## 2016-06-24 DIAGNOSIS — M869 Osteomyelitis, unspecified: Secondary | ICD-10-CM | POA: Diagnosis not present

## 2016-06-24 DIAGNOSIS — Z794 Long term (current) use of insulin: Secondary | ICD-10-CM

## 2016-06-24 NOTE — Progress Notes (Signed)
Nursing Home Location: [facility]  Place of Service: SNF (31)  PCP: [name], MD  Allergies  Allergen Reactions  . No Known Allergies     Chief Complaint  Patient presents with  . Medical Management of Chronic Issues    Routine Visit    HPI:  Patient is a 76 y.o. male seen today at [facility] for routine follow up.  Pt with hx of paraplegic due to MS, CKD, DM and hyperlipidemia. [facility]. Pt had rehospitalization in January due to urosepsis.Urine culture isolated Klebsiella and MRSA. Culture returned 05/12/16 revealing Klebsiella which was uniformly resistant to oral medications except possibly Cipro. He was to continue vancomycin  Monday, Wednesday, Friday with hemodialysis. Full 14 day course  completed as of 1/19 dose. Meropenem 500 mg daily at 8 PM 1/15-1/23/18. Most recently VRE noted in urine, currently on linezoloid for 14 days. He conts to be followed with urology, he has chronic foley catheter due to neurogenic bladder from MS.  He has chronic stage IV right ischial pressure ulcer & stage 3 left ischial pressure ulcer. Wound care consult was completed. He tentatively has an appointment at [facility] to consider grafts for these lesions. Wound care at [facility] following pt, biopsy taken revealing osteomyelitis chronic and acute.  He continues dialysis  Monday, Wednesday and Friday.   Review of Systems:  Review of Systems  Constitutional: Negative for activity change and appetite change.  HENT: Negative for congestion.   Respiratory: Negative for cough and shortness of breath.   Cardiovascular: Negative for chest pain, palpitations and leg swelling.  Gastrointestinal: Negative for abdominal pain and constipation.  Genitourinary:       Chronic foley   Musculoskeletal: Negative for back pain and myalgias.  Neurological:  Negative for dizziness.  Psychiatric/Behavioral: The patient is not nervous/anxious.     Past Medical History:  Diagnosis Date  . Acute CHF (congestive heart failure) ([place]) 02/14/2016  . Acute osteomyelitis, pelvis, right ([place])   . Acute renal failure ([place])   . Anemia in chronic renal disease   . Blood transfusion   . Cardiomyopathy ([place]) 02/17/2016  . Chronic spastic paraplegia (HCC)    secondary to MS  . CKD (chronic kidney disease), stage III   . Decreased sensation of lower extremity    due to MS  . Decubitus ulcer of ischium   . Decubitus ulcer of trochanter, stage 4 ([place]) 06/14/2015  . History of MRSA infection    urine  . History of recurrent UTIs   . Hypertension   . MS (multiple sclerosis) ([place])   . Neuralgia neuritis, sciatic nerve    MS for 30 years  . Neurogenic bladder   . PONV (postoperative nausea and vomiting)   . Self-catheterizes urinary bladder   . Type 2 diabetes mellitus ([place])    Past Surgical History:  Procedure Laterality Date  . APPLICATION OF A-CELL OF EXTREMITY Left 09/05/2014   Procedure: PLACEMENT OF ACELL AND VAC;  Surgeon: Theodoro Kos, DO;  Location: [place];  Service: Plastics;  Laterality: Left;  . BASCILIC VEIN TRANSPOSITION Right 03/24/2016   Procedure: RADIAL- CEPHALIC FISTULA CREATION RIGHT ARM;  Surgeon: Conrad [place], MD;  Location: [facility];  Service: Vascular;  Laterality: Right;  . EXTRACORPOREAL SHOCK WAVE LITHOTRIPSY  03-23-2011  . FINGER SURGERY  2004  . INCISION AND DRAINAGE OF WOUND Left  09/05/2014   Procedure: IRRIGATION AND DEBRIDEMENT OF LEFT ISCHIUM WOUND WITH ;  Surgeon: Theodoro Kos, DO;  Location: [facility];  Service: Plastics;  Laterality: Left;  . INSERTION OF DIALYSIS CATHETER N/A 02/27/2016   Procedure: INSERTION OF DIALYSIS CATHETER-RIGHT INTERNAL JUGULA PLACEMENT;  Surgeon: Conrad [place], MD;  Location: [facility];  Service: Vascular;  Laterality: N/A;  . TONSILLECTOMY    . WOUND DEBRIDEMENT     [facility]    Social History:    reports that he quit smoking about 33 years ago. His smoking use included Cigars. He has never used smokeless tobacco. He reports that he drinks about 1.8 oz of alcohol per week . He reports that he does not use drugs.  Family History  Problem Relation Age of Onset  . Stroke Father   . Diabetes Father   . Diabetes Paternal Grandmother   . Stroke Paternal Grandfather   . Heart disease Neg Hx   . Cancer Neg Hx     Medications: Patient's Medications  New Prescriptions   No medications on file  Previous Medications   ACETAMINOPHEN (TYLENOL) 325 MG TABLET    Take 650 mg by mouth every 6 (six) hours as needed.   AMINO ACIDS-PROTEIN HYDROLYS (FEEDING SUPPLEMENT, PRO-STAT SUGAR FREE 64,) LIQD    Take 9ml by mouth twice daily for wound healing   ASPIRIN 81 MG TABLET    Take 1 tablet (81 mg total) by mouth daily.   B COMPLEX-VITAMIN C-FOLIC ACID (NEPHRO-VITE) 0.8 MG TABS TABLET    Take 1 tablet by mouth at bedtime.   BACLOFEN (LIORESAL) 10 MG TABLET    Take 10 mg by mouth at bedtime as needed for muscle spasms.   CARVEDILOL (COREG) 3.125 MG TABLET    Take 3.125 mg by mouth 2 (two) times daily with a meal.   ENOXAPARIN (LOVENOX) 30 MG/0.3ML INJECTION    Inject 30 mg into the skin daily.   FAMOTIDINE (PEPCID) 20 MG TABLET    Take 20 mg by mouth 2 (two) times daily.   FERROUS SULFATE 325 (65 FE) MG TABLET    Take 325 mg by mouth 2 (two) times daily with a meal.   FUROSEMIDE (LASIX) 40 MG TABLET    Take 40 mg by mouth 2 (two) times daily.   INSULIN GLARGINE (LANTUS) 100 UNIT/ML INJECTION    Inject 12 Units into the skin at bedtime.   LINEZOLID (ZYVOX) 600 MG TABLET    Take 600 mg by mouth 2 (two) times daily. For 14 days. Start on 06/14/16   MELATONIN 3 MG TABS    Take 3 mg by mouth at bedtime.   NUTRITIONAL SUPPLEMENTS (NEPRO) LIQD    One can by mouth twice daily for supplement/wound healing   OXYBUTYNIN (DITROPAN) 5 MG TABLET    Take 5 mg by mouth 2 (two) times daily.    POLYETHYLENE GLYCOL  (MIRALAX / GLYCOLAX) PACKET    Take 17 g by mouth daily as needed for mild constipation.   PROMETHAZINE (PHENERGAN) 25 MG TABLET    Take 25 mg by mouth every 6 (six) hours as needed for nausea or vomiting.   SACCHAROMYCES BOULARDII (FLORASTOR) 250 MG CAPSULE    Take 250 mg by mouth 2 (two) times daily. For 14 days; started on 06/15/15   SERTRALINE (ZOLOFT) 50 MG TABLET    Take 50 mg by mouth daily.    SUCROFERRIC OXYHYDROXIDE (VELPHORO) 500 MG CHEWABLE TABLET    Chew 1,000 mg by mouth  3 (three) times daily with meals.   TRAMADOL (ULTRAM) 50 MG TABLET    Take 50 mg by mouth every 6 (six) hours as needed.   Modified Medications   No medications on file  Discontinued Medications   FERRIC CITRATE (AURYXIA) 1 GM 210 MG(FE) TABLET    Take 420 mg by mouth 3 (three) times daily with meals.    HEPARIN 5000 UNIT/ML INJECTION    Inject 5,000 Units into the skin every 8 (eight) hours.   MEROPENEM 500 MG IN SODIUM CHLORIDE 0.9 % 50 ML    Inject 500 mg into the vein daily at 8 pm. For 8 additional days to complete 14 days   NON FORMULARY    Drink 1 can of Nephro Steady twice daily.   VANCOMYCIN (VANCOCIN) 750-5 MG/150ML-% SOLN    Inject 150 mLs (750 mg total) into the vein every Monday, Wednesday, and Friday with hemodialysis. For 2 additional doses 1/17 and 1/19 with HD, to complete a 14 day course.     Physical Exam: Vitals:   06/24/16 1130  BP: (!) 145/60  Pulse: 78  Resp: 20  Temp: 98.2 F (36.8 C)  SpO2: 96%  Weight: 164 lb (74.4 kg)  Height: 6\' 4"  (1.93 m)    Physical Exam  Constitutional: He is oriented to person, place, and time. He appears well-developed and well-nourished. No distress.  HENT:  Head: Normocephalic and atraumatic.  Mouth/Throat: Oropharynx is clear and moist. No oropharyngeal exudate.  Eyes: Conjunctivae and EOM are normal. Pupils are equal, round, and reactive to light.  Neck: Normal range of motion. Neck supple.  Cardiovascular: Normal rate, regular rhythm and normal  heart sounds.   Pulmonary/Chest: Effort normal and breath sounds normal.  Abdominal: Soft. Bowel sounds are normal.  Genitourinary:  Genitourinary Comments: Foley catheter with clear yellow urine  Musculoskeletal: He exhibits no edema or tenderness.  Atrophied muscle of the lower legs  Neurological: He is alert and oriented to person, place, and time.  Paraplegia due to MS, unna boots on bilaterally  Skin: Skin is warm and dry. He is not diaphoretic.  Psychiatric: He has a normal mood and affect.    Labs reviewed: Basic Metabolic Panel:  Recent Labs  02/19/16 0445  02/20/16 0420  04/03/16 1146  05/12/16 0615 05/14/16 0541 05/15/16 1919 05/18/16 0700 05/25/16  NA 138  < > 136  < > 137  < > 133* 137 134* 134*  --   K 4.5  < > 4.3  < > 4.5  < > 4.4 3.8 4.0 4.8  --   CL 103  < > 102  < > 100*  < > 99* 100* 96* 97*  --   CO2 27  < > 24  < > 28  < > 23 26 27 25   --   GLUCOSE 104*  < > 139*  < > 185*  < > 109* 145* 156* 112*  --   BUN 38*  < > 25*  < > 40*  < > 30* 21* 9 40*  --   CREATININE 1.78*  < > 1.60*  < > 4.46*  < > 3.02* 2.86* 1.67* 4.74* 5.9*  CALCIUM 8.5*  < > 8.6*  < > 10.3  < > 8.4* 8.7* 8.1* 9.0  --   MG 2.7*  --  2.6*  --   --   --  1.9  --   --   --   --   PHOS 3.6  < >  2.4*  < > 4.7*  --   --   --  1.5* 6.0*  --   < > = values in this interval not displayed. Liver Function Tests:  Recent Labs  03/06/16 0414 03/09/16 0833  04/20/16 05/09/16 1151 05/15/16 1919 05/18/16 0700  AST 17 15  --  10* 16  --   --   ALT 19 17  --  7* 13*  --   --   ALKPHOS 62 68  --  68 67  --   --   BILITOT 0.4 0.4  --   --  0.8  --   --   PROT 6.6 6.7  --   --  7.3  --   --   ALBUMIN 1.8*  1.8* 2.0*  2.0*  < >  --  2.1* 1.7* 1.5*  < > = values in this interval not displayed.  Recent Labs  01/30/16 1305  LIPASE 18   No results for input(s): AMMONIA in the last 8760 hours. CBC:  Recent Labs  02/27/16 0500  03/03/16 0714  05/09/16 1151  05/14/16 0541 05/15/16 1919  05/18/16 0500  WBC 7.0  < > 6.3  < > 9.5  < > 12.1* 8.1 9.7  NEUTROABS 4.3  --  4.0  --  7.7  --   --   --   --   HGB 7.7*  < > 7.2*  < > 9.4*  < > 7.9* 8.0* 7.9*  HCT 23.8*  < > 22.4*  < > 29.8*  < > 25.1* 24.7* 24.4*  MCV 94.1  < > 94.1  < > 97.7  < > 97.7 95.4 95.7  PLT 227  < > 226  < > 199  < > 256 278 325  < > = values in this interval not displayed. TSH: No results for input(s): TSH in the last 8760 hours. A1C: Lab Results  Component Value Date   HGBA1C 6.1 04/10/2016   Lipid Panel: No results for input(s): CHOL, HDL, LDLCALC, TRIG, CHOLHDL, LDLDIRECT in the last 8760 hours.   Assessment/Plan 1. Recurrent UTI conts to follow up with urology, currently on linezolid for VRE   2. ESRD on dialysis ([place]) conts on dialysis M,W,F  3. Multiple sclerosis ([place]) Paraplegic, chronic foley due to neurogenic bladder.  Without increase in pain or spasticity   4. Controlled type 2 diabetes mellitus with stage 3 chronic kidney disease, with long-term current use of insulin (HCC) A1c at goal, will cont current regimen   5. Osteomyelitis, unspecified site, unspecified type ([place]) Wound care following pt and sample sent to [facility] which revealed acute on chronic osteomyelitis, ID referral placed and pt with upcoming appt in March  6. Decubitus ulcer of right ischium, unstageable ([place]) conts to be followed by wound care in facility as well as [place]. Harle Battiest  [facility] [phone] 8 am - 5 pm) [phone] (after hours)

## 2016-06-25 DIAGNOSIS — L89220 Pressure ulcer of left hip, unstageable: Secondary | ICD-10-CM | POA: Diagnosis not present

## 2016-06-25 DIAGNOSIS — L89214 Pressure ulcer of right hip, stage 4: Secondary | ICD-10-CM | POA: Diagnosis not present

## 2016-06-26 ENCOUNTER — Encounter: Payer: Self-pay | Admitting: Vascular Surgery

## 2016-06-26 ENCOUNTER — Ambulatory Visit (INDEPENDENT_AMBULATORY_CARE_PROVIDER_SITE_OTHER): Payer: Self-pay | Admitting: Vascular Surgery

## 2016-06-26 VITALS — BP 128/59 | HR 70 | Temp 98.1°F | Resp 18 | Ht 76.0 in | Wt 163.0 lb

## 2016-06-26 DIAGNOSIS — Z23 Encounter for immunization: Secondary | ICD-10-CM | POA: Diagnosis not present

## 2016-06-26 DIAGNOSIS — N186 End stage renal disease: Secondary | ICD-10-CM

## 2016-06-26 DIAGNOSIS — Z992 Dependence on renal dialysis: Secondary | ICD-10-CM

## 2016-06-26 DIAGNOSIS — N2581 Secondary hyperparathyroidism of renal origin: Secondary | ICD-10-CM | POA: Diagnosis not present

## 2016-06-26 DIAGNOSIS — D631 Anemia in chronic kidney disease: Secondary | ICD-10-CM | POA: Diagnosis not present

## 2016-06-26 DIAGNOSIS — E1129 Type 2 diabetes mellitus with other diabetic kidney complication: Secondary | ICD-10-CM | POA: Diagnosis not present

## 2016-06-29 DIAGNOSIS — D631 Anemia in chronic kidney disease: Secondary | ICD-10-CM | POA: Diagnosis not present

## 2016-06-29 DIAGNOSIS — N186 End stage renal disease: Secondary | ICD-10-CM | POA: Diagnosis not present

## 2016-06-29 DIAGNOSIS — N2581 Secondary hyperparathyroidism of renal origin: Secondary | ICD-10-CM | POA: Diagnosis not present

## 2016-06-29 DIAGNOSIS — E1129 Type 2 diabetes mellitus with other diabetic kidney complication: Secondary | ICD-10-CM | POA: Diagnosis not present

## 2016-06-29 DIAGNOSIS — Z23 Encounter for immunization: Secondary | ICD-10-CM | POA: Diagnosis not present

## 2016-06-30 ENCOUNTER — Non-Acute Institutional Stay (SKILLED_NURSING_FACILITY): Payer: Medicare Other | Admitting: Internal Medicine

## 2016-06-30 DIAGNOSIS — A4901 Methicillin susceptible Staphylococcus aureus infection, unspecified site: Secondary | ICD-10-CM | POA: Diagnosis not present

## 2016-06-30 DIAGNOSIS — L89220 Pressure ulcer of left hip, unstageable: Secondary | ICD-10-CM | POA: Diagnosis not present

## 2016-06-30 DIAGNOSIS — E1121 Type 2 diabetes mellitus with diabetic nephropathy: Secondary | ICD-10-CM

## 2016-06-30 DIAGNOSIS — L89314 Pressure ulcer of right buttock, stage 4: Secondary | ICD-10-CM

## 2016-06-30 DIAGNOSIS — M869 Osteomyelitis, unspecified: Secondary | ICD-10-CM | POA: Diagnosis not present

## 2016-06-30 LAB — CBC AND DIFFERENTIAL
HCT: 20 % — AB (ref 41–53)
Hemoglobin: 6.5 g/dL — AB (ref 13.5–17.5)
WBC: 7.5 10^3/mL

## 2016-06-30 NOTE — Progress Notes (Signed)
    06/30/16;  Facility [facility]  Chief complaint; review of multiple medical issues including anemia, question recurrent osteomyelitis.  History of present illness; this is from previous stays at the [facility] wound care center.he has had previous pressure sores and had plastic surgery at [facility] He has a stage IV wound over his right ischial tuberosity. He has exposed bone. A bone biopsy was done by the in on2/3/18. This showed a light growth of MRSA.  Sensitivities to follow although I  Don't see these sensitivities.since I last saw this manis now on dialysis for chronic renal failure. He is alsorecently been discovered to have a hemoglobin of 6.5. This is from 2/27. The MCV is actually elevated at 98.1   His platelet count was normal. Differentit essentiallyal white count He was on Lovenox. That was discontinued. His hemoglobin was apparently 7.8 on 2/23 He does not appear to be on Procrit or Aranesp. He is on iron.Stool for guacs have been ordered.  I note that the patient has a Foley cath, not sure why. He was recently treated for 2 weeks with Zyvox secondary to VRE in his urine.this ended yesterday.   the patient has a pico wound VAC apparently could not get a KCI I am not sure what the issue was here.  Social; the patient's last day in the facility is March 14 as he runs out of Medicare days. This will place a lot of the man's on his family He is now on dialysis  Review of systems Ge patient states he feels "okay" Respiratory no cough no sputum Cardiac no chest pain GI no obvious bleeding GU Foley catheter in place.  hysical examination Gen. The patient looks pale Abdomen; no liver no spleen no masses no tenderness GU; he has a Foley catheter in place, I'm not exactly sure whether he puts out enough urinefor this to be used to protect his wound Skin; he has a fairly nondescript area ohe clearly has palpable bone in this.  Impression/plan #1the bone biopsy from [facility] this m  showed acute on chrnic osteomyelitis. The culture showed a light growth of MRSA.He is going to need IV antibiotics I'll call dialysis tomorrowl. He saw plastic surgery at [facility] and although the patient told me he didn't feel they wanted to operate the tone of the note I read suggested   They were consiering it. #2 significant anemia the hemoglobin drped to 6.5. Order from today as dialysis to recheck this tomorrow he is getting iron replacement. His iron level on 2/23 was 99 although I don't see a percent saturation. His RDW was highpossibly suggesting reticulocytosis. I note his serum albumin was 2.5

## 2016-07-01 DIAGNOSIS — E1122 Type 2 diabetes mellitus with diabetic chronic kidney disease: Secondary | ICD-10-CM | POA: Diagnosis not present

## 2016-07-01 DIAGNOSIS — N2581 Secondary hyperparathyroidism of renal origin: Secondary | ICD-10-CM | POA: Diagnosis not present

## 2016-07-01 DIAGNOSIS — Z992 Dependence on renal dialysis: Secondary | ICD-10-CM | POA: Diagnosis not present

## 2016-07-01 DIAGNOSIS — E1129 Type 2 diabetes mellitus with other diabetic kidney complication: Secondary | ICD-10-CM | POA: Diagnosis not present

## 2016-07-01 DIAGNOSIS — D631 Anemia in chronic kidney disease: Secondary | ICD-10-CM | POA: Diagnosis not present

## 2016-07-01 DIAGNOSIS — Z23 Encounter for immunization: Secondary | ICD-10-CM | POA: Diagnosis not present

## 2016-07-01 DIAGNOSIS — N186 End stage renal disease: Secondary | ICD-10-CM | POA: Diagnosis not present

## 2016-07-01 NOTE — Addendum Note (Signed)
Addended by: Lianne Cure A on: 07/01/2016 03:13 PM   Modules accepted: Orders

## 2016-07-02 ENCOUNTER — Encounter: Payer: Self-pay | Admitting: Internal Medicine

## 2016-07-02 ENCOUNTER — Non-Acute Institutional Stay (SKILLED_NURSING_FACILITY): Payer: Medicare Other | Admitting: Internal Medicine

## 2016-07-02 ENCOUNTER — Inpatient Hospital Stay (HOSPITAL_COMMUNITY)
Admission: EM | Admit: 2016-07-02 | Discharge: 2016-07-05 | DRG: 811 | Disposition: A | Discharge status: Skilled Nursing Facility | Payer: Medicare Other | Attending: Internal Medicine | Admitting: Internal Medicine

## 2016-07-02 ENCOUNTER — Encounter (HOSPITAL_COMMUNITY): Payer: Self-pay

## 2016-07-02 DIAGNOSIS — Z7982 Long term (current) use of aspirin: Secondary | ICD-10-CM

## 2016-07-02 DIAGNOSIS — N319 Neuromuscular dysfunction of bladder, unspecified: Secondary | ICD-10-CM | POA: Diagnosis present

## 2016-07-02 DIAGNOSIS — Z87440 Personal history of urinary (tract) infections: Secondary | ICD-10-CM

## 2016-07-02 DIAGNOSIS — Z8614 Personal history of Methicillin resistant Staphylococcus aureus infection: Secondary | ICD-10-CM

## 2016-07-02 DIAGNOSIS — I132 Hypertensive heart and chronic kidney disease with heart failure and with stage 5 chronic kidney disease, or end stage renal disease: Secondary | ICD-10-CM | POA: Diagnosis not present

## 2016-07-02 DIAGNOSIS — N390 Urinary tract infection, site not specified: Secondary | ICD-10-CM | POA: Diagnosis present

## 2016-07-02 DIAGNOSIS — K922 Gastrointestinal hemorrhage, unspecified: Secondary | ICD-10-CM | POA: Diagnosis not present

## 2016-07-02 DIAGNOSIS — D649 Anemia, unspecified: Secondary | ICD-10-CM

## 2016-07-02 DIAGNOSIS — L89214 Pressure ulcer of right hip, stage 4: Secondary | ICD-10-CM | POA: Diagnosis not present

## 2016-07-02 DIAGNOSIS — F32A Depression, unspecified: Secondary | ICD-10-CM | POA: Diagnosis present

## 2016-07-02 DIAGNOSIS — D631 Anemia in chronic kidney disease: Secondary | ICD-10-CM | POA: Diagnosis present

## 2016-07-02 DIAGNOSIS — R5383 Other fatigue: Secondary | ICD-10-CM | POA: Diagnosis not present

## 2016-07-02 DIAGNOSIS — Z992 Dependence on renal dialysis: Secondary | ICD-10-CM | POA: Diagnosis not present

## 2016-07-02 DIAGNOSIS — Z794 Long term (current) use of insulin: Secondary | ICD-10-CM | POA: Diagnosis not present

## 2016-07-02 DIAGNOSIS — M869 Osteomyelitis, unspecified: Secondary | ICD-10-CM | POA: Diagnosis not present

## 2016-07-02 DIAGNOSIS — Z7401 Bed confinement status: Secondary | ICD-10-CM

## 2016-07-02 DIAGNOSIS — E1122 Type 2 diabetes mellitus with diabetic chronic kidney disease: Secondary | ICD-10-CM

## 2016-07-02 DIAGNOSIS — L89314 Pressure ulcer of right buttock, stage 4: Secondary | ICD-10-CM | POA: Diagnosis not present

## 2016-07-02 DIAGNOSIS — E1129 Type 2 diabetes mellitus with other diabetic kidney complication: Secondary | ICD-10-CM | POA: Diagnosis present

## 2016-07-02 DIAGNOSIS — L89320 Pressure ulcer of left buttock, unstageable: Secondary | ICD-10-CM | POA: Diagnosis present

## 2016-07-02 DIAGNOSIS — G35 Multiple sclerosis: Secondary | ICD-10-CM | POA: Diagnosis present

## 2016-07-02 DIAGNOSIS — I4581 Long QT syndrome: Secondary | ICD-10-CM | POA: Diagnosis present

## 2016-07-02 DIAGNOSIS — Z7901 Long term (current) use of anticoagulants: Secondary | ICD-10-CM

## 2016-07-02 DIAGNOSIS — I5042 Chronic combined systolic (congestive) and diastolic (congestive) heart failure: Secondary | ICD-10-CM | POA: Diagnosis present

## 2016-07-02 DIAGNOSIS — G35D Multiple sclerosis, unspecified: Secondary | ICD-10-CM

## 2016-07-02 DIAGNOSIS — I10 Essential (primary) hypertension: Secondary | ICD-10-CM | POA: Diagnosis not present

## 2016-07-02 DIAGNOSIS — E876 Hypokalemia: Secondary | ICD-10-CM | POA: Diagnosis present

## 2016-07-02 DIAGNOSIS — M86272 Subacute osteomyelitis, left ankle and foot: Secondary | ICD-10-CM | POA: Insufficient documentation

## 2016-07-02 DIAGNOSIS — G8220 Paraplegia, unspecified: Secondary | ICD-10-CM | POA: Diagnosis present

## 2016-07-02 DIAGNOSIS — Z833 Family history of diabetes mellitus: Secondary | ICD-10-CM

## 2016-07-02 DIAGNOSIS — D62 Acute posthemorrhagic anemia: Secondary | ICD-10-CM | POA: Diagnosis not present

## 2016-07-02 DIAGNOSIS — R195 Other fecal abnormalities: Secondary | ICD-10-CM | POA: Diagnosis not present

## 2016-07-02 DIAGNOSIS — D509 Iron deficiency anemia, unspecified: Secondary | ICD-10-CM | POA: Diagnosis not present

## 2016-07-02 DIAGNOSIS — F329 Major depressive disorder, single episode, unspecified: Secondary | ICD-10-CM | POA: Diagnosis present

## 2016-07-02 DIAGNOSIS — R69 Illness, unspecified: Secondary | ICD-10-CM | POA: Diagnosis not present

## 2016-07-02 DIAGNOSIS — L89159 Pressure ulcer of sacral region, unspecified stage: Secondary | ICD-10-CM | POA: Diagnosis not present

## 2016-07-02 DIAGNOSIS — G8250 Quadriplegia, unspecified: Secondary | ICD-10-CM

## 2016-07-02 DIAGNOSIS — E440 Moderate protein-calorie malnutrition: Secondary | ICD-10-CM | POA: Insufficient documentation

## 2016-07-02 DIAGNOSIS — K5900 Constipation, unspecified: Secondary | ICD-10-CM | POA: Diagnosis present

## 2016-07-02 DIAGNOSIS — N186 End stage renal disease: Secondary | ICD-10-CM

## 2016-07-02 DIAGNOSIS — K219 Gastro-esophageal reflux disease without esophagitis: Secondary | ICD-10-CM | POA: Diagnosis present

## 2016-07-02 DIAGNOSIS — Z681 Body mass index (BMI) 19 or less, adult: Secondary | ICD-10-CM

## 2016-07-02 DIAGNOSIS — M8668 Other chronic osteomyelitis, other site: Secondary | ICD-10-CM | POA: Diagnosis present

## 2016-07-02 DIAGNOSIS — D539 Nutritional anemia, unspecified: Secondary | ICD-10-CM | POA: Diagnosis present

## 2016-07-02 DIAGNOSIS — L89220 Pressure ulcer of left hip, unstageable: Secondary | ICD-10-CM | POA: Diagnosis not present

## 2016-07-02 DIAGNOSIS — N2581 Secondary hyperparathyroidism of renal origin: Secondary | ICD-10-CM | POA: Diagnosis present

## 2016-07-02 DIAGNOSIS — E1169 Type 2 diabetes mellitus with other specified complication: Secondary | ICD-10-CM | POA: Diagnosis present

## 2016-07-02 DIAGNOSIS — Z993 Dependence on wheelchair: Secondary | ICD-10-CM

## 2016-07-02 DIAGNOSIS — Z823 Family history of stroke: Secondary | ICD-10-CM

## 2016-07-02 DIAGNOSIS — F3289 Other specified depressive episodes: Secondary | ICD-10-CM | POA: Diagnosis not present

## 2016-07-02 DIAGNOSIS — Z87891 Personal history of nicotine dependence: Secondary | ICD-10-CM

## 2016-07-02 LAB — CBC WITH DIFFERENTIAL/PLATELET
Basophils Absolute: 0 10*3/uL (ref 0.0–0.1)
Basophils Relative: 0 %
Eosinophils Absolute: 0.9 10*3/uL — ABNORMAL HIGH (ref 0.0–0.7)
Eosinophils Relative: 10 %
HCT: 12.2 % — ABNORMAL LOW (ref 39.0–52.0)
Hemoglobin: 3.7 g/dL — CL (ref 13.0–17.0)
Lymphocytes Relative: 26 %
Lymphs Abs: 2.4 10*3/uL (ref 0.7–4.0)
MCH: 30.4 pg (ref 26.0–34.0)
MCHC: 31.1 g/dL (ref 30.0–36.0)
MCV: 97.6 fL (ref 78.0–100.0)
Monocytes Absolute: 1.2 10*3/uL — ABNORMAL HIGH (ref 0.1–1.0)
Monocytes Relative: 13 %
Neutro Abs: 4.7 10*3/uL (ref 1.7–7.7)
Neutrophils Relative %: 51 %
Platelets: 151 10*3/uL (ref 150–400)
RBC: 1.25 MIL/uL — ABNORMAL LOW (ref 4.22–5.81)
RDW: 16.9 % — ABNORMAL HIGH (ref 11.5–15.5)
WBC: 9.2 10*3/uL (ref 4.0–10.5)

## 2016-07-02 LAB — COMPREHENSIVE METABOLIC PANEL
ALT: 15 U/L — ABNORMAL LOW (ref 17–63)
AST: 18 U/L (ref 15–41)
Albumin: 2.1 g/dL — ABNORMAL LOW (ref 3.5–5.0)
Alkaline Phosphatase: 60 U/L (ref 38–126)
Anion gap: 10 (ref 5–15)
BUN: 36 mg/dL — ABNORMAL HIGH (ref 6–20)
CO2: 32 mmol/L (ref 22–32)
Calcium: 9 mg/dL (ref 8.9–10.3)
Chloride: 96 mmol/L — ABNORMAL LOW (ref 101–111)
Creatinine, Ser: 3.19 mg/dL — ABNORMAL HIGH (ref 0.61–1.24)
GFR calc Af Amer: 20 mL/min — ABNORMAL LOW (ref >60)
GFR calc non Af Amer: 18 mL/min — ABNORMAL LOW (ref >60)
Glucose, Bld: 175 mg/dL — ABNORMAL HIGH (ref 65–99)
Potassium: 3.3 mmol/L — ABNORMAL LOW (ref 3.5–5.1)
Sodium: 138 mmol/L (ref 135–145)
Total Bilirubin: 0.3 mg/dL (ref 0.3–1.2)
Total Protein: 7.5 g/dL (ref 6.5–8.1)

## 2016-07-02 LAB — PREPARE RBC (CROSSMATCH)

## 2016-07-02 LAB — CBC AND DIFFERENTIAL
HCT: 18 % — AB (ref 41–53)
Hemoglobin: 5.9 g/dL — AB (ref 13.5–17.5)
Platelets: 136 10*3/uL — AB (ref 150–399)
WBC: 6.9 10^3/mL

## 2016-07-02 LAB — POC OCCULT BLOOD, ED: Fecal Occult Bld: NEGATIVE

## 2016-07-02 MED ORDER — SODIUM CHLORIDE 0.9 % IV SOLN
10.0000 mL/h | Freq: Once | INTRAVENOUS | Status: AC
  Administered 2016-07-02: 10 mL/h via INTRAVENOUS

## 2016-07-02 NOTE — H&P (Addendum)
History and Physical    Lance [name] OMV:[id number] [name]: 11-Sep-1940 DOA: 07/02/2016  Referring MD/NP/PA: Eliezer Mccoy, PA-C PCP: [name], MD  Patient coming from: [facility]   Chief Complaint: Low hemoglobin  HPI: Lance [name] is a 76 y.o. male with medical history significant of ESRD on HD(M/W/F), DM type 2, multiple sclerosis with paraplegia, chronic anemia, decubitus ulcer, CHF, and neurogenic bladder with chronic catheter; who presents with complaints of low hemoglobin count. Patient reports that his hemoglobin last week was 7.7 g/dL and normally is between 7-9 g/dL. He had been staying at [facility] for cure of a left gluteal decubitus ulcer. There is a wound VAC over the ulcer but patient notes that there has been very minimal drainage. Over the last week his hemoglobin was noted to drop when checked at [facility]. 2 days ago his hemoglobin was 6.5 and then today noted to be 5.5. He had been on Lovenox injections daily for DVT prophylaxis, but this had been stopped after initial drop. Associated symptoms include generalized fatigue, constipation, dark stools(on iron supplementation), and occasional nausea/nonbloody emesis with dialysis. Denies any significant chest pain, shortness of breath, fever, or chills.  He has required transfusions in the past and notes that his last transfusion was some time in 04/2016, but his hemoglobin has never dropped as low. Patient reports that his last colonoscopy was likely more than 10 years ago.    ED Course:  Upon admission to the emergency department patient was seen have relatively normal vital signs. Lab work revealed hemoglobin 3.7, potassium 3.3, BUN 36, creatinine 3.19. Stool guaiac was negative. Patient was ordered to be transfused 3 units of packed red blood cells. TRH called to admitt.  Review of Systems: As per HPI otherwise 10 point review of systems negative.   Past Medical History:  Diagnosis Date  . Acute CHF (congestive  heart failure) ([place]) 02/14/2016  . Acute osteomyelitis, pelvis, right ([place])   . Acute renal failure ([place])   . Anemia in chronic renal disease   . Blood transfusion   . Cardiomyopathy ([place]) 02/17/2016  . Chronic spastic paraplegia ([facility])    secondary to MS  . CKD (chronic kidney disease), stage III   . Decreased sensation of lower extremity    due to MS  . Decubitus ulcer of ischium   . Decubitus ulcer of trochanter, stage 4 ([place]) 06/14/2015  . History of MRSA infection    urine  . History of recurrent UTIs   . Hypertension   . MS (multiple sclerosis) ([place])   . Neuralgia neuritis, sciatic nerve    MS for 30 years  . Neurogenic bladder   . PONV (postoperative nausea and vomiting)   . Self-catheterizes urinary bladder   . Type 2 diabetes mellitus ([place])     Past Surgical History:  Procedure Laterality Date  . APPLICATION OF A-CELL OF EXTREMITY Left 09/05/2014   Procedure: PLACEMENT OF ACELL AND VAC;  Surgeon: Theodoro Kos, DO;  Location: [place];  Service: Plastics;  Laterality: Left;  . BASCILIC VEIN TRANSPOSITION Right 03/24/2016   Procedure: RADIAL- CEPHALIC FISTULA CREATION RIGHT ARM;  Surgeon: Conrad Leesburg, MD;  Location: [place];  Service: Vascular;  Laterality: Right;  . EXTRACORPOREAL SHOCK WAVE LITHOTRIPSY  03-23-2011  . FINGER SURGERY  2004  . INCISION AND DRAINAGE OF WOUND Left 09/05/2014   Procedure: IRRIGATION AND DEBRIDEMENT OF LEFT ISCHIUM WOUND WITH ;  Surgeon: Theodoro Kos, DO;  Location: [place];  Service: Plastics;  Laterality: Left;  .  INSERTION OF DIALYSIS CATHETER N/A 02/27/2016   Procedure: INSERTION OF DIALYSIS CATHETER-RIGHT INTERNAL JUGULA PLACEMENT;  Surgeon: Conrad , MD;  Location: [place];  Service: Vascular;  Laterality: N/A;  . TONSILLECTOMY    . WOUND DEBRIDEMENT     [facility]/2/17[facility]      reports that he quit smoking about 33 years ago. His smoking use included Cigars. He has never used smokeless tobacco. He reports that he drinks about 0.6 oz of alcohol  per week . He reports that he does not use drugs.  No Known Allergies  Family History  Problem Relation Age of Onset  . Stroke Father   . Diabetes Father   . Diabetes Paternal Grandmother   . Stroke Paternal Grandfather   . Heart disease Neg Hx   . Cancer Neg Hx     Prior to Admission medications   Medication Sig Start Date End Date Taking? Authorizing Provider  acetaminophen (TYLENOL) 325 MG tablet Take 650 mg by mouth every 6 (six) hours as needed for moderate pain or fever.    Yes Historical Provider, MD  Amino Acids-Protein Hydrolys (FEEDING SUPPLEMENT, PRO-STAT SUGAR FREE 64,) LIQD Take 23ml by mouth twice daily for wound healing   Yes Historical Provider, MD  aspirin 81 MG tablet Take 1 tablet (81 mg total) by mouth daily. 03/30/11  Yes Rolan Bucco, MD  b complex-vitamin c-folic acid (NEPHRO-VITE) 0.8 MG TABS tablet Take 1 tablet by mouth every morning.    Yes Historical Provider, MD  baclofen (LIORESAL) 10 MG tablet Take 10 mg by mouth at bedtime as needed for muscle spasms.   Yes Historical Provider, MD  bisacodyl (DULCOLAX) 10 MG suppository Place 10 mg rectally as needed for moderate constipation.   Yes Historical Provider, MD  carvedilol (COREG) 3.125 MG tablet Take 3.125 mg by mouth 2 (two) times daily with a meal.   Yes Historical Provider, MD  famotidine (PEPCID) 20 MG tablet Take 20 mg by mouth 2 (two) times daily.   Yes Historical Provider, MD  ferrous sulfate 325 (65 FE) MG tablet Take 325 mg by mouth 2 (two) times daily with a meal.   Yes Historical Provider, MD  furosemide (LASIX) 20 MG tablet Take 20 mg by mouth 2 (two) times daily.   Yes Historical Provider, MD  insulin glargine (LANTUS) 100 UNIT/ML injection Inject 12 Units into the skin every morning.    Yes Historical Provider, MD  magnesium hydroxide (MILK OF MAGNESIA) 400 MG/5ML suspension Take 30 mLs by mouth daily as needed for mild constipation.   Yes Historical Provider, MD  Melatonin 3 MG TABS Take 3 mg  by mouth at bedtime.   Yes Historical Provider, MD  Nutritional Supplements (NEPRO) LIQD Take 237 mLs by mouth 2 (two) times daily. One can by mouth twice daily for supplement/wound healing    Yes Historical Provider, MD  oxybutynin (DITROPAN) 5 MG tablet Take 5 mg by mouth 2 (two) times daily.    Yes Historical Provider, MD  polyethylene glycol (MIRALAX / GLYCOLAX) packet Take 17 g by mouth daily as needed for mild constipation. 02/27/16  Yes Nita Sells, MD  promethazine (PHENERGAN) 25 MG tablet Take 25 mg by mouth every 6 (six) hours as needed for nausea or vomiting.   Yes Historical Provider, MD  sertraline (ZOLOFT) 50 MG tablet Take 50 mg by mouth at bedtime.  04/23/16  Yes Hendricks Limes, MD  Skin Protectants, Misc. (EUCERIN) cream Apply 1 application topically daily. For feet  Yes Historical Provider, MD  Sodium Phosphates (RA SALINE ENEMA) 19-7 GM/118ML ENEM Place 1 application rectally once.   Yes Historical Provider, MD  sucroferric oxyhydroxide (VELPHORO) 500 MG chewable tablet Chew 1,000 mg by mouth 3 (three) times daily with meals.   Yes Historical Provider, MD  traMADol (ULTRAM) 50 MG tablet Take 50 mg by mouth every 6 (six) hours as needed for severe pain.    Yes Historical Provider, MD  enoxaparin (LOVENOX) 30 MG/0.3ML injection Inject 30 mg into the skin daily.    Historical Provider, MD    Physical Exam:  Constitutional: Elderly male who appears to be in NAD, calm, but uncomfortable laying in hospital gurney. Vitals:   07/02/16 2025 07/02/16 2030 07/02/16 2130 07/02/16 2200  BP: 151/60 142/59 149/61 153/62  Pulse: 97 94 87 91  Resp: 22 22 21 19   Temp: 98.7 F (37.1 C)     TempSrc: Oral     SpO2: 98% 99% 97% 100%  Weight: 73.9 kg (163 lb)     Height: 6\' 4"  (1.93 m)      Eyes: PERRL, lids and conjunctivae normal ENMT: Mucous membranes are dry. Posterior pharynx clear of any exudate or lesions.  Neck: normal, supple, no masses, no thyromegaly Respiratory:  clear to auscultation bilaterally, no wheezing, no crackles. Mildly tachypneic. No accessory muscle use.  Cardiovascular: Regular rate and rhythm, no murmurs / rubs / gallops. No extremity edema. 2+ pedal pulses. No carotid bruits. Port access of the right upper chest wall. Maturing fistula of the right upper extremity. Abdomen: no tenderness, no masses palpated. No hepatosplenomegaly. Bowel sounds positive.  Musculoskeletal: no clubbing / cyanosis. Paraplegia with lower extremity contractures.  Skin: Pallor. decubitus ulcer stage 2-3.   Neurologic: CN 2-12 grossly intact. Sensation intact, DTR normal. Strength 5/5 in all 4.  Psychiatric: Normal judgment and insight. Alert and oriented x 3. Normal mood.     Labs on Admission: I have personally reviewed following labs and imaging studies  CBC:  Recent Labs Lab 07/02/16 07/02/16 2107  WBC 6.9 9.2  NEUTROABS  --  4.7  HGB 5.9* 3.7*  HCT 18* 12.2*  MCV  --  97.6  PLT 136* 161   Basic Metabolic Panel:  Recent Labs Lab 07/02/16 2107  NA 138  K 3.3*  CL 96*  CO2 32  GLUCOSE 175*  BUN 36*  CREATININE 3.19*  CALCIUM 9.0   GFR: Estimated Creatinine Clearance: 20.9 mL/min (by C-G formula based on SCr of 3.19 mg/dL (H)). Liver Function Tests:  Recent Labs Lab 07/02/16 2107  AST 18  ALT 15*  ALKPHOS 60  BILITOT 0.3  PROT 7.5  ALBUMIN 2.1*   No results for input(s): LIPASE, AMYLASE in the last 168 hours. No results for input(s): AMMONIA in the last 168 hours. Coagulation Profile: No results for input(s): INR, PROTIME in the last 168 hours. Cardiac Enzymes: No results for input(s): CKTOTAL, CKMB, CKMBINDEX, TROPONINI in the last 168 hours. BNP (last 3 results) No results for input(s): PROBNP in the last 8760 hours. HbA1C: No results for input(s): HGBA1C in the last 72 hours. CBG: No results for input(s): GLUCAP in the last 168 hours. Lipid Profile: No results for input(s): CHOL, HDL, LDLCALC, TRIG, CHOLHDL, LDLDIRECT  in the last 72 hours. Thyroid Function Tests: No results for input(s): TSH, T4TOTAL, FREET4, T3FREE, THYROIDAB in the last 72 hours. Anemia Panel: No results for input(s): VITAMINB12, FOLATE, FERRITIN, TIBC, IRON, RETICCTPCT in the last 72 hours. Urine analysis:  Component Value Date/Time   COLORURINE AMBER (A) 05/09/2016 1220   APPEARANCEUR CLOUDY (A) 05/09/2016 1220   LABSPEC 1.012 05/09/2016 1220   PHURINE 8.0 05/09/2016 1220   GLUCOSEU NEGATIVE 05/09/2016 1220   HGBUR LARGE (A) 05/09/2016 1220   BILIRUBINUR NEGATIVE 05/09/2016 1220   KETONESUR NEGATIVE 05/09/2016 1220   PROTEINUR 100 (A) 05/09/2016 1220   UROBILINOGEN 0.2 08/30/2014 2203   NITRITE NEGATIVE 05/09/2016 1220   LEUKOCYTESUR SMALL (A) 05/09/2016 1220   Sepsis Labs: No results found for this or any previous visit (from the past 240 hour(s)).   Radiological Exams on Admission: No results found.  EKG: Independently reviewed. Sinus rhythm with borderline QTC  Assessment/Plan Symptomatic anemia/Question GI bleed: acute. Patient presents with downward trending hemoglobin to 3.7 on admission. Stool guaiac negative. Question possible source at this time differential includes diverticular bleed now possibly resolved versus blood loss from decubitus wound versus upper GI bleed.  - Admit to a telemetry bed - Continuous pulse oximetry with nasal cannula oxygen as needed - Continue transfusion of 3 units of PRBCs - Continue Ferrous sulfate - Serial monitoring of H&H  - Monitor electrolytes and replace as needed  - clear liquid diet for now - Will possible need consult to GI in a.m.  ESRD on HD(M/W/F) - Message left on hemodialysis voicemail  Left decubitus wound  and Osteomyelitis:  Patient reports that Dr. Dellia Nims had wanted him to started on vancomycin during dialysis treatments from recent bone biopsy that showed light growth of MRSA. See nursing home note from 06/30/2016. - Low air loss mattress - Wound care  consult  - We will likely need to arrange this with nephrology.  Hypokalemia: Acute. potassium 3.3 on admission - Give 40 mEq of potassium chloride 1 dose now - Continue to monitor and replace as needed   Multiple sclerosis with paraplegia: At baseline patient reports being bedbound, and is intermittently transported in a wheelchair. - Continue baclofen, oxybutynin   Neurogenic bladder with chronic indwelling Foley :Patient with previous history of recurrent UTIs. - Follow-up urinalysis    Diabetes mellitus type 2 - Hypoglycemic protocol  - Continue home Lantus dose  - CBGs with sensitive SSI  Constipation - Continue to monitor and may warrant further treatment  Depression - Continue Zoloft  Essential hypertension - Continue home Coreg and Lasix  Gerd - Continue Pepcid  DVT prophylaxis: SCDs Code Status: Full   Family Communication: No family present at bedside Disposition Plan: [facility] Consults called: none  Admission status: inpatient  Norval Morton MD [facility] Pager [phone]  If 7PM-7AM, please contact night-coverage [email or web address] Password [facility]  07/02/2016, 10:29 PM

## 2016-07-02 NOTE — Progress Notes (Signed)
    [facility] Room: 224  PCP: [name], MD [place] [id number]   This is a nursing facility follow up for specific acute issue of progressive anemia.  Interim medical record and care since last [place] visit was updated with review of diagnostic studies and change in clinical status since last visit were documented.  HPI: He reports that 48 hours ago he had significant bleeding from the left hip wound. Nursing applied pressure for 15 minutes to control bleeding. Lovenox was discontinued. Denies any other bleeding dyscrasias. He has no history of deep venous thromboses or pulmonary thromboembolism, the Lovenox had been prescribed because of his sedentary state in the context of multiple sclerosis.  Also urine culture revealed over 100,000 colonies of at least 2 organisms. Urology had been consult to, suprapubic tube placement was not felt to be of any value in preventing recurrent urinary tract infections. Treatment of colonization was not recommended as selection of a resistant organism would be anticipated.  Review of systems: Epistaxis, hemoptysis, hematuria, melena, or rectal bleeding denied. No unexplained weight loss, significant dyspepsia,dysphagia, or abdominal pain.  There is no abnormal bruising. Despite the severity of the anemia, he denies shortness of breath or chest pain. He has no active GU symptoms in the context of his MS.  He has a follow-up with infectious disease for possible MRSA osteomyelitis later this month to address positive cultures of the wound and bone fragments. Dr. Dellia Nims who is a wound care specialist stated that he would contact dialysis to arrange vancomycin therapy for one month.  Physical exam:  Pertinent or positive findings:He is markedly pale. Has occasional premature beat. The wound in the left hip area is dressed.  Lower extremities are in booties. He has no movement of the lower extremities.  There is wasting of the hands. Pedal pulses are decreased.    General appearance:Adequately nourished; no acute distress , increased work of breathing is present.   Lymphatic: No lymphadenopathy about the head, neck, axilla . Eyes: No conjunctival inflammation or lid edema is present. There is no scleral icterus. Ears:  External ear exam shows no significant lesions or deformities.   Nose:  External nasal examination shows no deformity or inflammation. Nasal mucosa are pink and moist without lesions ,exudates Oral exam: lips and gums are healthy appearing.There is no oropharyngeal erythema or exudate . Neck:  No thyromegaly, masses, tenderness noted.    Heart:  Normal rate and regular rhythm. S1 and S2 normal without gallop, murmur, click, rub .  Lungs:Chest clear to auscultation without wheezes, rhonchi,rales , rubs. Abdomen:Bowel sounds are normal. Abdomen is soft and nontender with no organomegaly, hernias,masses. GU: Foley cath Skin: Warm & dry w/o tenting. No significant rash.    See summary under each active problem in the Problem List with associated updated therapeutic plan

## 2016-07-02 NOTE — Patient Instructions (Addendum)
Severe anemia despite lack of cardio pulmonary symptoms necessitates transfusion ; transport to ER Lovenox will be discontinued as risk outweighs benefit of this drug prophylactically.

## 2016-07-02 NOTE — Assessment & Plan Note (Signed)
Vancomycin will be arranged by Dr Dellia Nims through dialysis for one month

## 2016-07-02 NOTE — ED Provider Notes (Signed)
[facility] DEPT Provider Note   CSN: [id number] Arrival date & time: 07/02/16  2007     History   Chief Complaint Chief Complaint  Patient presents with  . Abnormal Lab    HPI Lance [name] is a 76 y.o. male with history of MS, CHF, anemia, CKD with dialysis MWF who presents from [place] with low hemoglobin. Patient has felt more fatigued recently. He denies any chest pain, shortness of breath, abdominal pain, nausea, vomiting. Patient has a urinary catheter and is on a 1500 mL fluid restriction. Patient also has a decubitus ulcer that has a wound VAC attached on his R buttock. Patient does not note any blood in his stools, however he states that he does not usually see his stool. He has had blood transfusions in the past. Patient is on iron supplementation.  HPI  Past Medical History:  Diagnosis Date  . Acute CHF (congestive heart failure) ([place]) 02/14/2016  . Acute osteomyelitis, pelvis, right ([place])   . Acute renal failure ([place])   . Anemia in chronic renal disease   . Blood transfusion   . Cardiomyopathy ([place]) 02/17/2016  . Chronic spastic paraplegia (HCC)    secondary to MS  . CKD (chronic kidney disease), stage III   . Decreased sensation of lower extremity    due to MS  . Decubitus ulcer of ischium   . Decubitus ulcer of trochanter, stage 4 ([place]) 06/14/2015  . History of MRSA infection    urine  . History of recurrent UTIs   . Hypertension   . MS (multiple sclerosis) ([address])   . Neuralgia neuritis, sciatic nerve    MS for 30 years  . Neurogenic bladder   . PONV (postoperative nausea and vomiting)   . Self-catheterizes urinary bladder   . Type 2 diabetes mellitus [facility])     Patient Active Problem List   Diagnosis Date Noted  . Osteomyelitis ([place]) 07/02/2016  . Symptomatic anemia 07/02/2016  . Respiratory failure ([place]) 05/09/2016  . SIRS (systemic inflammatory response syndrome) ([place]) 05/09/2016  . Melena 05/09/2016  . Hematemesis 05/09/2016  .  Non-intractable vomiting with nausea 05/07/2016  . Altered mental status 04/21/2016  . Acute respiratory failure ([place])   . ESRD on dialysis ([address])   . NICM (nonischemic cardiomyopathy) ([place])   . Impaired functional mobility, balance, and endurance 02/17/2016  . Generalized anxiety disorder 02/17/2016  . Cardiomyopathy ([place]) 02/17/2016  . Hypoxia   . Acute pulmonary edema (HCC)   . Elevated troponin 02/14/2016  . Severe protein-calorie malnutrition ([place]) 02/14/2016  . Acute systolic heart failure ([place]) 02/14/2016  . Depression 02/13/2016  . DM (diabetes mellitus) type II controlled with renal manifestation ([place]) 09/06/2015  . Dyslipidemia associated with type 2 diabetes mellitus ([place]) 09/06/2015  . Recurrent UTI (urinary tract infection) 09/06/2015  . Decubitus ulcer of right ischial area 06/14/2015  . Decubitus ulcer of trochanter, stage 4 ([place]) 06/14/2015  . Left perineal ischial pressure ulcer   . Acute osteomyelitis, pelvis, right ([place])   . Essential hypertension, benign 08/03/2013  . Cellulitis of foot 08/02/2013  . Spastic quadriplegia ([place]) 07/10/2011  . Multiple sclerosis ([place]) 03/30/2011  . Sacral decubitus ulcer 03/30/2011  . Progressive anemia 03/30/2011    Past Surgical History:  Procedure Laterality Date  . APPLICATION OF A-CELL OF EXTREMITY Left 09/05/2014   Procedure: PLACEMENT OF ACELL AND VAC;  Surgeon: Theodoro Kos, DO;  Location: [name];  Service: Plastics;  Laterality: Left;  . BASCILIC VEIN TRANSPOSITION Right 03/24/2016  Procedure: RADIAL- CEPHALIC FISTULA CREATION RIGHT ARM;  Surgeon: Conrad Silverton, MD;  Location: [place];  Service: Vascular;  Laterality: Right;  . EXTRACORPOREAL SHOCK WAVE LITHOTRIPSY  03-23-2011  . FINGER SURGERY  2004  . INCISION AND DRAINAGE OF WOUND Left 09/05/2014   Procedure: IRRIGATION AND DEBRIDEMENT OF LEFT ISCHIUM WOUND WITH ;  Surgeon: Theodoro Kos, DO;  Location: [facility];  Service: Plastics;  Laterality: Left;  . INSERTION OF DIALYSIS  CATHETER N/A 02/27/2016   Procedure: INSERTION OF DIALYSIS CATHETER-RIGHT INTERNAL JUGULA PLACEMENT;  Surgeon: Conrad , MD;  Location: [facility] [place];  Service: Vascular;  Laterality: N/A;  . TONSILLECTOMY    . WOUND DEBRIDEMENT     [facility]/2/17[facility]        Home Medications    Prior to Admission medications   Medication Sig Start Date End Date Taking? Authorizing Provider  acetaminophen (TYLENOL) 325 MG tablet Take 650 mg by mouth every 6 (six) hours as needed for moderate pain or fever.    Yes Historical Provider, MD  Amino Acids-Protein Hydrolys (FEEDING SUPPLEMENT, PRO-STAT SUGAR FREE 64,) LIQD Take 42ml by mouth twice daily for wound healing   Yes Historical Provider, MD  aspirin 81 MG tablet Take 1 tablet (81 mg total) by mouth daily. 03/30/11  Yes Rolan Bucco, MD  b complex-vitamin c-folic acid (NEPHRO-VITE) 0.8 MG TABS tablet Take 1 tablet by mouth every morning.    Yes Historical Provider, MD  baclofen (LIORESAL) 10 MG tablet Take 10 mg by mouth at bedtime as needed for muscle spasms.   Yes Historical Provider, MD  bisacodyl (DULCOLAX) 10 MG suppository Place 10 mg rectally as needed for moderate constipation.   Yes Historical Provider, MD  carvedilol (COREG) 3.125 MG tablet Take 3.125 mg by mouth 2 (two) times daily with a meal.   Yes Historical Provider, MD  famotidine (PEPCID) 20 MG tablet Take 20 mg by mouth 2 (two) times daily.   Yes Historical Provider, MD  ferrous sulfate 325 (65 FE) MG tablet Take 325 mg by mouth 2 (two) times daily with a meal.   Yes Historical Provider, MD  furosemide (LASIX) 20 MG tablet Take 20 mg by mouth 2 (two) times daily.   Yes Historical Provider, MD  insulin glargine (LANTUS) 100 UNIT/ML injection Inject 12 Units into the skin every morning.    Yes Historical Provider, MD  magnesium hydroxide (MILK OF MAGNESIA) 400 MG/5ML suspension Take 30 mLs by mouth daily as needed for mild constipation.   Yes Historical Provider, MD  Melatonin 3 MG TABS Take 3 mg  by mouth at bedtime.   Yes Historical Provider, MD  Nutritional Supplements (NEPRO) LIQD Take 237 mLs by mouth 2 (two) times daily. One can by mouth twice daily for supplement/wound healing    Yes Historical Provider, MD  oxybutynin (DITROPAN) 5 MG tablet Take 5 mg by mouth 2 (two) times daily.    Yes Historical Provider, MD  polyethylene glycol (MIRALAX / GLYCOLAX) packet Take 17 g by mouth daily as needed for mild constipation. 02/27/16  Yes Nita Sells, MD  promethazine (PHENERGAN) 25 MG tablet Take 25 mg by mouth every 6 (six) hours as needed for nausea or vomiting.   Yes Historical Provider, MD  sertraline (ZOLOFT) 50 MG tablet Take 50 mg by mouth at bedtime.  04/23/16  Yes Hendricks Limes, MD  Skin Protectants, Misc. (EUCERIN) cream Apply 1 application topically daily. For feet   Yes Historical Provider, MD  Sodium Phosphates (RA SALINE  ENEMA) 19-7 GM/118ML ENEM Place 1 application rectally once.   Yes Historical Provider, MD  sucroferric oxyhydroxide (VELPHORO) 500 MG chewable tablet Chew 1,000 mg by mouth 3 (three) times daily with meals.   Yes Historical Provider, MD  traMADol (ULTRAM) 50 MG tablet Take 50 mg by mouth every 6 (six) hours as needed for severe pain.    Yes Historical Provider, MD  enoxaparin (LOVENOX) 30 MG/0.3ML injection Inject 30 mg into the skin daily.    Historical Provider, MD    Family History Family History  Problem Relation Age of Onset  . Stroke Father   . Diabetes Father   . Diabetes Paternal Grandmother   . Stroke Paternal Grandfather   . Heart disease Neg Hx   . Cancer Neg Hx     Social History Social History  Substance Use Topics  . Smoking status: Former Smoker    Types: Cigars    Quit date: 05/06/1983  . Smokeless tobacco: Never Used  . Alcohol use 0.6 oz/week    1 Glasses of wine per week     Comment: pt has not had any since Dec 2017     Allergies   Patient has no known allergies.   Review of Systems Review of Systems    Constitutional: Negative for chills and fever.  HENT: Negative for facial swelling and sore throat.   Respiratory: Negative for shortness of breath.   Cardiovascular: Negative for chest pain.  Gastrointestinal: Negative for abdominal pain, nausea and vomiting.  Genitourinary: Negative for dysuria.  Musculoskeletal: Negative for back pain.  Skin: Negative for rash and wound.  Neurological: Negative for headaches.  Psychiatric/Behavioral: The patient is not nervous/anxious.      Physical Exam Updated Vital Signs BP 149/63   Pulse 86   Temp 98.7 F (37.1 C) (Oral)   Resp 14   Ht 6\' 4"  (1.93 m)   Wt 73.9 kg   SpO2 96%   BMI 19.84 kg/m   Physical Exam  Constitutional: He appears well-developed and well-nourished. No distress.  HENT:  Head: Normocephalic and atraumatic.  Mouth/Throat: Oropharynx is clear and moist. No oropharyngeal exudate.  Eyes: Conjunctivae are normal. Pupils are equal, round, and reactive to light. Right eye exhibits no discharge. Left eye exhibits no discharge. No scleral icterus.  Neck: Normal range of motion. Neck supple. No thyromegaly present.  Cardiovascular: Normal rate, regular rhythm, normal heart sounds and intact distal pulses.  Exam reveals no gallop and no friction rub.   No murmur heard. Pulmonary/Chest: Effort normal and breath sounds normal. No stridor. No respiratory distress. He has no wheezes. He has no rales.  Abdominal: Soft. Bowel sounds are normal. He exhibits no distension. There is no tenderness. There is no rebound and no guarding.  Genitourinary: Rectal exam shows anal tone normal and guaiac negative stool.  Genitourinary Comments: Black stool  Musculoskeletal: He exhibits no edema.  Lymphadenopathy:    He has no cervical adenopathy.  Neurological: He is alert. Coordination normal.  Skin: Skin is warm and dry. No rash noted. He is not diaphoretic. No pallor.  Covered wound to R buttocks with wound vac  Psychiatric: He has a  normal mood and affect.  Nursing note and vitals reviewed.    ED Treatments / Results  Labs (all labs ordered are listed, but only abnormal results are displayed) Labs Reviewed  COMPREHENSIVE METABOLIC PANEL - Abnormal; Notable for the following:       Result Value   Potassium 3.3 (*)  Chloride 96 (*)    Glucose, Bld 175 (*)    BUN 36 (*)    Creatinine, Ser 3.19 (*)    Albumin 2.1 (*)    ALT 15 (*)    GFR calc non Af Amer 18 (*)    GFR calc Af Amer 20 (*)    All other components within normal limits  CBC WITH DIFFERENTIAL/PLATELET - Abnormal; Notable for the following:    RBC 1.25 (*)    Hemoglobin 3.7 (*)    HCT 12.2 (*)    RDW 16.9 (*)    Monocytes Absolute 1.2 (*)    Eosinophils Absolute 0.9 (*)    All other components within normal limits  URINALYSIS, ROUTINE W REFLEX MICROSCOPIC  BASIC METABOLIC PANEL  CBC  CALCIUM, IONIZED  POC OCCULT BLOOD, ED  TYPE AND SCREEN  PREPARE RBC (CROSSMATCH)    EKG  EKG Interpretation  Date/Time:  Thursday July 02 2016 20:34:35 EST Ventricular Rate:  90 PR Interval:    QRS Duration: 100 QT Interval:  401 QTC Calculation: 491 R Axis:   48 Text Interpretation:  Sinus rhythm Borderline prolonged QT interval Confirmed by Alvino Chapel  MD, NATHAN [phone]) on 07/03/2016 12:06:12 AM       Radiology No results found.  Procedures Procedures (including critical care time)  Medications Ordered in ED Medications  Melatonin TABS 3 mg (not administered)  RA SALINE ENEMA 19-7 [id number] ENEM 1 application (not administered)  carvedilol (COREG) tablet 3.125 mg (not administered)  promethazine (PHENERGAN) tablet 25 mg (not administered)  feeding supplement (PRO-STAT SUGAR FREE 64) liquid 30 mL (not administered)  bisacodyl (DULCOLAX) suppository 10 mg (not administered)  sertraline (ZOLOFT) tablet 50 mg (not administered)  furosemide (LASIX) tablet 20 mg (not administered)  insulin glargine (LANTUS) injection 12 Units (not  administered)  famotidine (PEPCID) tablet 20 mg (not administered)  ferrous sulfate tablet 325 mg (not administered)  oxybutynin (DITROPAN) tablet 5 mg (not administered)  traMADol (ULTRAM) tablet 50 mg (not administered)  albuterol (PROVENTIL) (2.5 MG/3ML) 0.083% nebulizer solution 2.5 mg (not administered)  insulin aspart (novoLOG) injection 0-9 Units (not administered)  insulin aspart (novoLOG) injection 0-5 Units (not administered)  potassium chloride SA (K-DUR,KLOR-CON) CR tablet 40 mEq (not administered)  0.9 %  sodium chloride infusion (10 mL/hr Intravenous New Bag/Given 07/02/16 2248)     Initial Impression / Assessment and Plan / ED Course  I have reviewed the triage vital signs and the nursing notes.  Pertinent labs & imaging results that were available during my care of the patient were reviewed by me and considered in my medical decision making (see chart for details).     Patient with acute on chronic anemia.No chest pain or shortness of breath CBC shows hemoglobin 3.7, hematocrit 12.2. CMP shows potassium 3.3, chloride 96, BUN 36, creatinine 3.19, albumin 2.1. Fecal occult negative. EKG shows NSR, prolonged QT. 2 units of packed RBCs ordered in the ED. I consulted Dr. Tamala Julian with [facility] who will admit the patient for further evaluation and treatment. Patient also evaluated by Dr. Alvino Chapel who guided the patient's management and agrees with plan.  Final Clinical Impressions(s) / ED Diagnoses   Final diagnoses:  Symptomatic anemia    New Prescriptions New Prescriptions   No medications on file     Frederica Kuster, Hershal Coria 07/03/16 0007    Davonna Belling, MD 07/03/16 [phone]

## 2016-07-02 NOTE — ED Notes (Signed)
Critical Lab Value   Hgb 3.7  Physician notified

## 2016-07-02 NOTE — Assessment & Plan Note (Signed)
07/02/16 send to ER for red packed cells because critical hemoglobin despite lack of symptoms

## 2016-07-02 NOTE — ED Triage Notes (Signed)
Pt BIB [facility] from [place]. Facility called EMS r/t drop in hemoglobin. Facility reports pt was at 2/23 pt hgb 7.8, 2/27 pt hgb 6.5, and today hgb was 5.9, 1.88 RBCs. Lovenox stopped X2 days Facility reports left hip wound with minimal bleeding and report that it has not been enough to associate with drop. Dialysis MWF. Pt A&O c/o fatigue.

## 2016-07-02 NOTE — ED Notes (Signed)
Pt signed consent.

## 2016-07-03 DIAGNOSIS — N2581 Secondary hyperparathyroidism of renal origin: Secondary | ICD-10-CM | POA: Diagnosis present

## 2016-07-03 DIAGNOSIS — F329 Major depressive disorder, single episode, unspecified: Secondary | ICD-10-CM | POA: Diagnosis present

## 2016-07-03 DIAGNOSIS — N186 End stage renal disease: Secondary | ICD-10-CM | POA: Diagnosis not present

## 2016-07-03 DIAGNOSIS — D500 Iron deficiency anemia secondary to blood loss (chronic): Secondary | ICD-10-CM | POA: Diagnosis not present

## 2016-07-03 DIAGNOSIS — N390 Urinary tract infection, site not specified: Secondary | ICD-10-CM | POA: Diagnosis present

## 2016-07-03 DIAGNOSIS — I4581 Long QT syndrome: Secondary | ICD-10-CM | POA: Diagnosis present

## 2016-07-03 DIAGNOSIS — L89324 Pressure ulcer of left buttock, stage 4: Secondary | ICD-10-CM | POA: Diagnosis not present

## 2016-07-03 DIAGNOSIS — Z466 Encounter for fitting and adjustment of urinary device: Secondary | ICD-10-CM | POA: Diagnosis not present

## 2016-07-03 DIAGNOSIS — K219 Gastro-esophageal reflux disease without esophagitis: Secondary | ICD-10-CM | POA: Diagnosis present

## 2016-07-03 DIAGNOSIS — I10 Essential (primary) hypertension: Secondary | ICD-10-CM

## 2016-07-03 DIAGNOSIS — E1129 Type 2 diabetes mellitus with other diabetic kidney complication: Secondary | ICD-10-CM | POA: Diagnosis not present

## 2016-07-03 DIAGNOSIS — Z992 Dependence on renal dialysis: Secondary | ICD-10-CM

## 2016-07-03 DIAGNOSIS — M86651 Other chronic osteomyelitis, right thigh: Secondary | ICD-10-CM | POA: Diagnosis not present

## 2016-07-03 DIAGNOSIS — G35 Multiple sclerosis: Secondary | ICD-10-CM

## 2016-07-03 DIAGNOSIS — M8668 Other chronic osteomyelitis, other site: Secondary | ICD-10-CM | POA: Diagnosis present

## 2016-07-03 DIAGNOSIS — E440 Moderate protein-calorie malnutrition: Secondary | ICD-10-CM | POA: Insufficient documentation

## 2016-07-03 DIAGNOSIS — I120 Hypertensive chronic kidney disease with stage 5 chronic kidney disease or end stage renal disease: Secondary | ICD-10-CM | POA: Diagnosis not present

## 2016-07-03 DIAGNOSIS — I509 Heart failure, unspecified: Secondary | ICD-10-CM | POA: Diagnosis not present

## 2016-07-03 DIAGNOSIS — R195 Other fecal abnormalities: Secondary | ICD-10-CM | POA: Diagnosis not present

## 2016-07-03 DIAGNOSIS — I132 Hypertensive heart and chronic kidney disease with heart failure and with stage 5 chronic kidney disease, or end stage renal disease: Secondary | ICD-10-CM | POA: Diagnosis present

## 2016-07-03 DIAGNOSIS — M869 Osteomyelitis, unspecified: Secondary | ICD-10-CM

## 2016-07-03 DIAGNOSIS — D631 Anemia in chronic kidney disease: Secondary | ICD-10-CM | POA: Diagnosis present

## 2016-07-03 DIAGNOSIS — Z794 Long term (current) use of insulin: Secondary | ICD-10-CM

## 2016-07-03 DIAGNOSIS — G8220 Paraplegia, unspecified: Secondary | ICD-10-CM | POA: Diagnosis present

## 2016-07-03 DIAGNOSIS — L89320 Pressure ulcer of left buttock, unstageable: Secondary | ICD-10-CM | POA: Diagnosis present

## 2016-07-03 DIAGNOSIS — E876 Hypokalemia: Secondary | ICD-10-CM | POA: Diagnosis present

## 2016-07-03 DIAGNOSIS — E1122 Type 2 diabetes mellitus with diabetic chronic kidney disease: Secondary | ICD-10-CM

## 2016-07-03 DIAGNOSIS — E1169 Type 2 diabetes mellitus with other specified complication: Secondary | ICD-10-CM | POA: Diagnosis present

## 2016-07-03 DIAGNOSIS — D649 Anemia, unspecified: Secondary | ICD-10-CM

## 2016-07-03 DIAGNOSIS — F3289 Other specified depressive episodes: Secondary | ICD-10-CM | POA: Diagnosis not present

## 2016-07-03 DIAGNOSIS — N319 Neuromuscular dysfunction of bladder, unspecified: Secondary | ICD-10-CM | POA: Diagnosis present

## 2016-07-03 DIAGNOSIS — I5042 Chronic combined systolic (congestive) and diastolic (congestive) heart failure: Secondary | ICD-10-CM | POA: Diagnosis present

## 2016-07-03 DIAGNOSIS — Z681 Body mass index (BMI) 19 or less, adult: Secondary | ICD-10-CM | POA: Diagnosis not present

## 2016-07-03 DIAGNOSIS — D62 Acute posthemorrhagic anemia: Secondary | ICD-10-CM | POA: Diagnosis present

## 2016-07-03 DIAGNOSIS — L89314 Pressure ulcer of right buttock, stage 4: Secondary | ICD-10-CM | POA: Diagnosis present

## 2016-07-03 DIAGNOSIS — Z7401 Bed confinement status: Secondary | ICD-10-CM | POA: Diagnosis not present

## 2016-07-03 DIAGNOSIS — F319 Bipolar disorder, unspecified: Secondary | ICD-10-CM | POA: Diagnosis not present

## 2016-07-03 DIAGNOSIS — K5900 Constipation, unspecified: Secondary | ICD-10-CM | POA: Diagnosis present

## 2016-07-03 DIAGNOSIS — R531 Weakness: Secondary | ICD-10-CM | POA: Diagnosis not present

## 2016-07-03 DIAGNOSIS — R5383 Other fatigue: Secondary | ICD-10-CM | POA: Diagnosis not present

## 2016-07-03 LAB — GLUCOSE, CAPILLARY
Glucose-Capillary: 117 mg/dL — ABNORMAL HIGH (ref 65–99)
Glucose-Capillary: 172 mg/dL — ABNORMAL HIGH (ref 65–99)
Glucose-Capillary: 147 mg/dL — ABNORMAL HIGH (ref 65–99)

## 2016-07-03 LAB — BASIC METABOLIC PANEL
ANION GAP: 9 (ref 5–15)
BUN: 40 mg/dL — AB (ref 6–20)
CALCIUM: 8.9 mg/dL (ref 8.9–10.3)
CO2: 28 mmol/L (ref 22–32)
Chloride: 101 mmol/L (ref 101–111)
Creatinine, Ser: 3.39 mg/dL — ABNORMAL HIGH (ref 0.61–1.24)
GFR calc Af Amer: 19 mL/min — ABNORMAL LOW (ref >60)
GFR, EST NON AFRICAN AMERICAN: 16 mL/min — AB (ref >60)
Glucose, Bld: 164 mg/dL — ABNORMAL HIGH (ref 65–99)
POTASSIUM: 3.8 mmol/L (ref 3.5–5.1)
SODIUM: 138 mmol/L (ref 135–145)

## 2016-07-03 LAB — MRSA PCR SCREENING: MRSA by PCR: NEGATIVE

## 2016-07-03 LAB — URINALYSIS, ROUTINE W REFLEX MICROSCOPIC
Glucose, UA: 100 mg/dL — AB
KETONES UR: NEGATIVE mg/dL
Nitrite: POSITIVE — AB
Protein, ur: 100 mg/dL — AB
Specific Gravity, Urine: 1.01 (ref 1.005–1.030)

## 2016-07-03 LAB — CBC
HCT: 27.6 % — ABNORMAL LOW (ref 39.0–52.0)
HEMOGLOBIN: 9.1 g/dL — AB (ref 13.0–17.0)
MCH: 30.5 pg (ref 26.0–34.0)
MCHC: 33 g/dL (ref 30.0–36.0)
MCV: 92.6 fL (ref 78.0–100.0)
Platelets: 127 10*3/uL — ABNORMAL LOW (ref 150–400)
RBC: 2.98 MIL/uL — ABNORMAL LOW (ref 4.22–5.81)
RDW: 16.1 % — ABNORMAL HIGH (ref 11.5–15.5)
WBC: 7.3 10*3/uL (ref 4.0–10.5)

## 2016-07-03 LAB — CBG MONITORING, ED
GLUCOSE-CAPILLARY: 127 mg/dL — AB (ref 65–99)
Glucose-Capillary: 128 mg/dL — ABNORMAL HIGH (ref 65–99)

## 2016-07-03 LAB — VANCOMYCIN, RANDOM: Vancomycin Rm: <4

## 2016-07-03 LAB — HEMOGLOBIN AND HEMATOCRIT, BLOOD
HEMATOCRIT: 29.5 % — AB (ref 39.0–52.0)
Hemoglobin: 9.5 g/dL — ABNORMAL LOW (ref 13.0–17.0)

## 2016-07-03 LAB — URINALYSIS, MICROSCOPIC (REFLEX)

## 2016-07-03 MED ORDER — CARVEDILOL 3.125 MG PO TABS
3.1250 mg | ORAL_TABLET | Freq: Two times a day (BID) | ORAL | Status: DC
  Administered 2016-07-03 – 2016-07-05 (×4): 3.125 mg via ORAL
  Filled 2016-07-03 (×5): qty 1

## 2016-07-03 MED ORDER — FAMOTIDINE 20 MG PO TABS
20.0000 mg | ORAL_TABLET | Freq: Two times a day (BID) | ORAL | Status: DC
  Administered 2016-07-03 – 2016-07-04 (×5): 20 mg via ORAL
  Filled 2016-07-03 (×5): qty 1

## 2016-07-03 MED ORDER — FLEET ENEMA 7-19 GM/118ML RE ENEM
1.0000 | ENEMA | Freq: Once | RECTAL | Status: DC | PRN
  Filled 2016-07-03: qty 1

## 2016-07-03 MED ORDER — PROMETHAZINE HCL 25 MG PO TABS: 25.0000 mg | ORAL_TABLET | Freq: Four times a day (QID) | ORAL | Status: DC | PRN

## 2016-07-03 MED ORDER — FERROUS SULFATE 325 (65 FE) MG PO TABS
325.0000 mg | ORAL_TABLET | Freq: Two times a day (BID) | ORAL | Status: DC
  Administered 2016-07-03 (×2): 325 mg via ORAL
  Filled 2016-07-03 (×3): qty 1

## 2016-07-03 MED ORDER — PRO-STAT SUGAR FREE PO LIQD
30.0000 mL | Freq: Two times a day (BID) | ORAL | Status: DC
  Administered 2016-07-03 (×2): 30 mL via ORAL
  Filled 2016-07-03 (×4): qty 30

## 2016-07-03 MED ORDER — FUROSEMIDE 20 MG PO TABS
20.0000 mg | ORAL_TABLET | Freq: Two times a day (BID) | ORAL | Status: DC
  Administered 2016-07-03: 20 mg via ORAL
  Filled 2016-07-03: qty 1

## 2016-07-03 MED ORDER — VANCOMYCIN HCL 1000 MG IV SOLR
1500.0000 mg | Freq: Once | INTRAVENOUS | Status: DC
  Filled 2016-07-03: qty 1500

## 2016-07-03 MED ORDER — VANCOMYCIN HCL IN DEXTROSE 750-5 MG/150ML-% IV SOLN: 750.0000 mg | INTRAVENOUS | Status: DC

## 2016-07-03 MED ORDER — BISACODYL 10 MG RE SUPP: 10.0000 mg | Freq: Every day | RECTAL | Status: DC | PRN

## 2016-07-03 MED ORDER — VANCOMYCIN HCL 1000 MG IV SOLR
1500.0000 mg | Freq: Once | INTRAVENOUS | Status: AC
  Administered 2016-07-03: 1500 mg via INTRAVENOUS
  Filled 2016-07-03: qty 1500

## 2016-07-03 MED ORDER — MELATONIN 3 MG PO TABS
3.0000 mg | ORAL_TABLET | Freq: Every day | ORAL | Status: DC
  Administered 2016-07-03 – 2016-07-04 (×3): 3 mg via ORAL
  Filled 2016-07-03 (×3): qty 1

## 2016-07-03 MED ORDER — TRAMADOL HCL 50 MG PO TABS: 50.0000 mg | ORAL_TABLET | Freq: Four times a day (QID) | ORAL | Status: DC | PRN

## 2016-07-03 MED ORDER — BOOST / RESOURCE BREEZE PO LIQD
1.0000 | Freq: Two times a day (BID) | ORAL | Status: DC
  Administered 2016-07-03: 1 via ORAL

## 2016-07-03 MED ORDER — FUROSEMIDE 10 MG/ML IJ SOLN
20.0000 mg | Freq: Once | INTRAMUSCULAR | Status: AC
  Administered 2016-07-03: 20 mg via INTRAVENOUS
  Filled 2016-07-03: qty 2

## 2016-07-03 MED ORDER — POTASSIUM CHLORIDE CRYS ER 20 MEQ PO TBCR
40.0000 meq | EXTENDED_RELEASE_TABLET | ORAL | Status: AC
  Administered 2016-07-03: 40 meq via ORAL
  Filled 2016-07-03: qty 2

## 2016-07-03 MED ORDER — VANCOMYCIN HCL IN DEXTROSE 750-5 MG/150ML-% IV SOLN
750.0000 mg | INTRAVENOUS | Status: DC
  Filled 2016-07-03: qty 150

## 2016-07-03 MED ORDER — INSULIN ASPART 100 UNIT/ML ~~LOC~~ SOLN
0.0000 [IU] | Freq: Three times a day (TID) | SUBCUTANEOUS | Status: DC
  Administered 2016-07-03: 2 [IU] via SUBCUTANEOUS
  Administered 2016-07-03: 1 [IU] via SUBCUTANEOUS
  Administered 2016-07-04: 2 [IU] via SUBCUTANEOUS
  Filled 2016-07-03: qty 1

## 2016-07-03 MED ORDER — ALBUTEROL SULFATE (2.5 MG/3ML) 0.083% IN NEBU: 2.5000 mg | INHALATION_SOLUTION | RESPIRATORY_TRACT | Status: DC | PRN

## 2016-07-03 MED ORDER — INSULIN ASPART 100 UNIT/ML ~~LOC~~ SOLN
0.0000 [IU] | Freq: Every day | SUBCUTANEOUS | Status: DC
  Administered 2016-07-04: 2 [IU] via SUBCUTANEOUS

## 2016-07-03 MED ORDER — SERTRALINE HCL 50 MG PO TABS
50.0000 mg | ORAL_TABLET | Freq: Every day | ORAL | Status: DC
  Administered 2016-07-03 – 2016-07-04 (×2): 50 mg via ORAL
  Filled 2016-07-03 (×3): qty 1

## 2016-07-03 MED ORDER — INSULIN GLARGINE 100 UNIT/ML ~~LOC~~ SOLN
12.0000 [IU] | Freq: Every day | SUBCUTANEOUS | Status: DC
  Administered 2016-07-03 – 2016-07-05 (×3): 12 [IU] via SUBCUTANEOUS
  Filled 2016-07-03 (×4): qty 0.12

## 2016-07-03 MED ORDER — OXYBUTYNIN CHLORIDE 5 MG PO TABS
5.0000 mg | ORAL_TABLET | Freq: Two times a day (BID) | ORAL | Status: DC
  Administered 2016-07-03 – 2016-07-05 (×6): 5 mg via ORAL
  Filled 2016-07-03 (×7): qty 1

## 2016-07-03 NOTE — Progress Notes (Signed)
Pharmacy Antibiotic Note  Lance [name] is a 76 y.o. male admitted on 07/02/2016 with Acute on chronic Symptomatic anemia (hgb 3.7) s/p transfusion. He also has a Left decubitus woundand chronic Osteomyelitis. Pharmacy has been consulted for vancomycin dosing.  Pt has ESRD on HD MWF, but will have HD tomorrow instead of today Random vancomycin level < 4, indicating pt hasn't received vancomycin yet  Plan: Vancomycin 1500 mg IV x 1 today Vancomycin 750 mg IV tomorrow after HD  Vancomycin 750 mg IV MWF next week for now F/u HD schedule and adjust as needed.     Height: 6\' 4"  (193 cm) Weight: 157 lb (71.2 kg) IBW/kg (Calculated) : 86.8  Temp (24hrs), Avg:98.2 F (36.8 C), Min:97.5 F (36.4 C), Max:98.8 F (37.1 C)   Recent Labs Lab 07/02/16 07/02/16 2107 07/03/16 0910  WBC 6.9 9.2 7.3  CREATININE  --  3.19* 3.39*    Estimated Creatinine Clearance: 19 mL/min (by C-G formula based on SCr of 3.39 mg/dL (H)).    No Known Allergies  Antimicrobials this admission: Vancomycin 3/2 >>  Dose adjustments this admission:   Microbiology results: 3/2 UCx:     Thank you for allowing pharmacy to be a part of this patient's care.  Maryanna Shape, PharmD, BCPS  Clinical Pharmacist  Pager: [phone]  07/03/2016 4:53 PM

## 2016-07-03 NOTE — Progress Notes (Signed)
Initial Nutrition Assessment  DOCUMENTATION CODES:   Non-severe (moderate) malnutrition in context of chronic illness  INTERVENTION:  Provide Boost Breeze po BID, each supplement provides 250 kcal and 9 grams of protein.  Continue 30 ml Prostat po BID, each supplement provides 100 kcal and 15 grams of protein.   Encourage adequate PO intake.   NUTRITION DIAGNOSIS:   Malnutrition related to chronic illness as evidenced by moderate depletion of body fat, moderate depletions of muscle mass.  GOAL:   Patient will meet greater than or equal to 90% of their needs  MONITOR:   PO intake, Supplement acceptance, Diet advancement, Labs, Weight trends, Skin, I & O's  REASON FOR ASSESSMENT:   Malnutrition Screening Tool    ASSESSMENT:   76 y.o. male with medical history significant of ESRD on HD(M/W/F), DM type 2, multiple sclerosis with paraplegia, chronic anemia, decubitus ulcer, CHF, and neurogenic bladder with chronic catheter; who presents with complaints of low hemoglobin count.  Pt is currently on a clear liquid diet. Pt reports appetite is just "ok". Pt reports usually consuming at least 2-3 meals a day with a Nepro shake and Premier Protein shake once daily. Pt reports following a renal/carb mod diet PTA. Question accuracy of recent weight recorded, however per weight records weight has been fluctuating (lkely related to fluid status) however mostly stable. RD to order Boost Breeze to aid in caloric and protein needs while pt on a clear liquid diet. Pt currently has Prostat ordered. RD to continue with orders.   Nutrition-Focused physical exam completed. Findings are moderate fat depletion, moderate muscle depletion, and no edema.   Labs and medications reviewed.  Diet Order:  Diet clear liquid Room service appropriate? Yes; Fluid consistency: Thin; Fluid restriction: 1500 mL Fluid  Skin:  Wound (see comment) (DTI to L foot, stg 4 R buttocks, stg 3 L thigh)  Last BM:   Unknown  Height:   Ht Readings from Last 1 Encounters:  07/02/16 6\' 4"  (1.93 m)    Weight:   Wt Readings from Last 1 Encounters:  07/03/16 157 lb (71.2 kg)  Admit weight  163 lb (73.9 kg)  Ideal Body Weight:  91.8 kg  BMI:  Body mass index is 19.11 kg/m.  Estimated Nutritional Needs:   Kcal:  2200-2400  Protein:  100-115 grams  Fluid:  1.5 L/day  EDUCATION NEEDS:   No education needs identified at this time  Corrin Parker, MS, RD, LDN Pager # [phone] After hours/ weekend pager # [phone]

## 2016-07-03 NOTE — Progress Notes (Signed)
Patient arrived on unit via stretcher from ED.  No family at bedside.  Telemetry placed per MD order and CMT notified.  

## 2016-07-03 NOTE — Consult Note (Signed)
[facility] Renal Consultation Note  Indication for Consultation:  Management of ESRD/hemodialysis; anemia, hypertension/volume and secondary hyperparathyroidism  HPI: Lance [name] is a 76 y.o. male. ESRD due to DM, started HD 02/14/16 during [facility] admit (02/27/16 - 04/09/16) d/c to [facility] - Multiple Sclerosis (wheelchair bound quadriplegia),neurogenic bladder with chronic foley, cardiomyopathy (EF 35-40%, moderate MR) with  R ischial pressure ulcer  With wound vac /with osteomyelitis,Dr Dellia Nims -following  At wound center with  ischial bone bx MRSA osteo saw him yesterday  and he wants him on 4 more weeks of Vanc. Per HPI Ho  hgb drop at [facility] and op kidney center.  At NH reported 6.5  06/30/16 and then today noted to be 5.5. Yesterday . Reported to be  symptomatic  Sent to ER= hemoglobin 3.7, potassium 3.3, BUN 36,  Stool guaiac was negative. Patient was ordered to be transfused 3 units of packed red blood cells.. At op kidney center ([facility] HAS NOT MISSED HDTIME )  -  hgb 6.1 on 07/01/16 >. 7.8 on 06/26/16> 7.8  On 06/17/16 and on max esa  225 mcg Mircera  q weeks last given 06/24/16( due next 07/08/16) - HGB now 9.1 after 3 u prbcs in ER. Pt on po Iron supplementation at NH with black stools and Velphoro (iron based phos binder ) . Currently no cos feels stronger/tells me has plans to move home 07/15/16 with home aide if stable . At op kidney center  achieving edw  With out problems and plans to move to [facility] center  When moves home with help arranged .   Past Medical History:  Diagnosis Date  . Acute CHF (congestive heart failure) ([place]) 02/14/2016  . Acute osteomyelitis, pelvis, right ([place])   . Acute renal failure ([place])   . Anemia in chronic renal disease   . Blood transfusion   . Cardiomyopathy ([place]) 02/17/2016  . Chronic spastic paraplegia (HCC)    secondary to MS  . CKD (chronic kidney disease), stage III   . Decreased sensation of lower extremity    due to  MS  . Decubitus ulcer of ischium   . Decubitus ulcer of trochanter, stage 4 ([place]) 06/14/2015  . History of MRSA infection    urine  . History of recurrent UTIs   . Hypertension   . MS (multiple sclerosis) ([place])   . Neuralgia neuritis, sciatic nerve    MS for 30 years  . Neurogenic bladder   . PONV (postoperative nausea and vomiting)   . Self-catheterizes urinary bladder   . Type 2 diabetes mellitus ([place])     Past Surgical History:  Procedure Laterality Date  . APPLICATION OF A-CELL OF EXTREMITY Left 09/05/2014   Procedure: PLACEMENT OF ACELL AND VAC;  Surgeon: Theodoro Kos, DO;  Location: [place];  Service: Plastics;  Laterality: Left;  . BASCILIC VEIN TRANSPOSITION Right 03/24/2016   Procedure: RADIAL- CEPHALIC FISTULA CREATION RIGHT ARM;  Surgeon: Conrad Perquimans, MD;  Location: [place];  Service: Vascular;  Laterality: Right;  . EXTRACORPOREAL SHOCK WAVE LITHOTRIPSY  03-23-2011  . FINGER SURGERY  2004  . INCISION AND DRAINAGE OF WOUND Left 09/05/2014   Procedure: IRRIGATION AND DEBRIDEMENT OF LEFT ISCHIUM WOUND WITH ;  Surgeon: Theodoro Kos, DO;  Location: [place];  Service: Plastics;  Laterality: Left;  . INSERTION OF DIALYSIS CATHETER N/A 02/27/2016   Procedure: INSERTION OF DIALYSIS CATHETER-RIGHT INTERNAL JUGULA PLACEMENT;  Surgeon: Conrad Craven, MD;  Location: [place];  Service: Vascular;  Laterality: N/A;  . TONSILLECTOMY    . WOUND DEBRIDEMENT     [facility]/2/17[facility]       Family History  Problem Relation Age of Onset  . Stroke Father   . Diabetes Father   . Diabetes Paternal Grandmother   . Stroke Paternal Grandfather   . Heart disease Neg Hx   . Cancer Neg Hx       reports that he quit smoking about 33 years ago. His smoking use included Cigars. He has never used smokeless tobacco. He reports that he drinks about 0.6 oz of alcohol per week . He reports that he does not use drugs.  No Known Allergies  Prior to Admission medications   Medication Sig Start Date End Date Taking?  Authorizing Provider  acetaminophen (TYLENOL) 325 MG tablet Take 650 mg by mouth every 6 (six) hours as needed for moderate pain or fever.    Yes Historical Provider, MD  Amino Acids-Protein Hydrolys (FEEDING SUPPLEMENT, PRO-STAT SUGAR FREE 64,) LIQD Take 64m by mouth twice daily for wound healing   Yes Historical Provider, MD  aspirin 81 MG tablet Take 1 tablet (81 mg total) by mouth daily. 03/30/11  Yes DRolan Bucco MD  b complex-vitamin c-folic acid (NEPHRO-VITE) 0.8 MG TABS tablet Take 1 tablet by mouth every morning.    Yes Historical Provider, MD  baclofen (LIORESAL) 10 MG tablet Take 10 mg by mouth at bedtime as needed for muscle spasms.   Yes Historical Provider, MD  bisacodyl (DULCOLAX) 10 MG suppository Place 10 mg rectally as needed for moderate constipation.   Yes Historical Provider, MD  carvedilol (COREG) 3.125 MG tablet Take 3.125 mg by mouth 2 (two) times daily with a meal.   Yes Historical Provider, MD  famotidine (PEPCID) 20 MG tablet Take 20 mg by mouth 2 (two) times daily.   Yes Historical Provider, MD  ferrous sulfate 325 (65 FE) MG tablet Take 325 mg by mouth 2 (two) times daily with a meal.   Yes Historical Provider, MD  furosemide (LASIX) 20 MG tablet Take 20 mg by mouth 2 (two) times daily.   Yes Historical Provider, MD  insulin glargine (LANTUS) 100 UNIT/ML injection Inject 12 Units into the skin every morning.    Yes Historical Provider, MD  magnesium hydroxide (MILK OF MAGNESIA) 400 MG/5ML suspension Take 30 mLs by mouth daily as needed for mild constipation.   Yes Historical Provider, MD  Melatonin 3 MG TABS Take 3 mg by mouth at bedtime.   Yes Historical Provider, MD  Nutritional Supplements (NEPRO) LIQD Take 237 mLs by mouth 2 (two) times daily. One can by mouth twice daily for supplement/wound healing    Yes Historical Provider, MD  oxybutynin (DITROPAN) 5 MG tablet Take 5 mg by mouth 2 (two) times daily.    Yes Historical Provider, MD  polyethylene glycol  (MIRALAX / GLYCOLAX) packet Take 17 g by mouth daily as needed for mild constipation. 02/27/16  Yes JNita Sells MD  promethazine (PHENERGAN) 25 MG tablet Take 25 mg by mouth every 6 (six) hours as needed for nausea or vomiting.   Yes Historical Provider, MD  sertraline (ZOLOFT) 50 MG tablet Take 50 mg by mouth at bedtime.  04/23/16  Yes WHendricks Limes MD  Skin Protectants, Misc. (EUCERIN) cream Apply 1 application topically daily. For feet   Yes Historical Provider, MD  Sodium Phosphates (RA SALINE ENEMA) 19-7 GM/118ML ENEM Place 1 application rectally once.   Yes Historical Provider,  MD  sucroferric oxyhydroxide (VELPHORO) 500 MG chewable tablet Chew 1,000 mg by mouth 3 (three) times daily with meals.   Yes Historical Provider, MD  traMADol (ULTRAM) 50 MG tablet Take 50 mg by mouth every 6 (six) hours as needed for severe pain.    Yes Historical Provider, MD  enoxaparin (LOVENOX) 30 MG/0.3ML injection Inject 30 mg into the skin daily.    Historical Provider, MD    [id number], bisacodyl, promethazine, sodium phosphate, traMADol  Results for orders placed or performed during the hospital encounter of 07/02/16 (from the past 48 hour(s))  Comprehensive metabolic panel     Status: Abnormal   Collection Time: 07/02/16  9:07 PM  Result Value Ref Range   Sodium 138 135 - 145 mmol/L   Potassium 3.3 (L) 3.5 - 5.1 mmol/L   Chloride 96 (L) 101 - 111 mmol/L   CO2 32 22 - 32 mmol/L   Glucose, Bld 175 (H) 65 - 99 mg/dL   BUN 36 (H) 6 - 20 mg/dL   Creatinine, Ser 3.19 (H) 0.61 - 1.24 mg/dL   Calcium 9.0 8.9 - 10.3 mg/dL   Total Protein 7.5 6.5 - 8.1 g/dL   Albumin 2.1 (L) 3.5 - 5.0 g/dL   AST 18 15 - 41 U/L   ALT 15 (L) 17 - 63 U/L   Alkaline Phosphatase 60 38 - 126 U/L   Total Bilirubin 0.3 0.3 - 1.2 mg/dL   GFR calc non Af Amer 18 (L) >60 mL/min   GFR calc Af Amer 20 (L) >60 mL/min    Comment: (NOTE) The eGFR has been calculated using the CKD EPI equation. This calculation has not  been validated in all clinical situations. eGFR's persistently <60 mL/min signify possible Chronic Kidney Disease.    Anion gap 10 5 - 15  CBC with Differential     Status: Abnormal   Collection Time: 07/02/16  9:07 PM  Result Value Ref Range   WBC 9.2 4.0 - 10.5 K/uL   RBC 1.25 (L) 4.22 - 5.81 MIL/uL   Hemoglobin 3.7 (LL) 13.0 - 17.0 g/dL    Comment: REPEATED TO VERIFY CRITICAL RESULT CALLED TO, READ BACK BY AND VERIFIED WITH: WAGNER N RN AT 2144 ON 03.01.2018 BY COCHRANE S    HCT 12.2 (L) 39.0 - 52.0 %   MCV 97.6 78.0 - 100.0 fL   MCH 30.4 26.0 - 34.0 pg   MCHC 31.1 30.0 - 36.0 g/dL   RDW 16.9 (H) 11.5 - 15.5 %   Platelets 151 150 - 400 K/uL   Neutrophils Relative % 51 %   Neutro Abs 4.7 1.7 - 7.7 K/uL   Lymphocytes Relative 26 %   Lymphs Abs 2.4 0.7 - 4.0 K/uL   Monocytes Relative 13 %   Monocytes Absolute 1.2 (H) 0.1 - 1.0 K/uL   Eosinophils Relative 10 %   Eosinophils Absolute 0.9 (H) 0.0 - 0.7 K/uL   Basophils Relative 0 %   Basophils Absolute 0.0 0.0 - 0.1 K/uL  Type and screen [place]     Status: None (Preliminary result)   Collection Time: 07/02/16  9:07 PM  Result Value Ref Range   ABO/RH(D) A POS    Antibody Screen NEG    Sample Expiration 07/05/2016    Unit Number [id number]    Blood Component Type RED CELLS,LR    Unit division 00    Status of Unit ISSUED    Transfusion Status OK TO TRANSFUSE  Crossmatch Result Compatible    Unit Number [id number]    Blood Component Type RED CELLS,LR    Unit division 00    Status of Unit ISSUED,FINAL    Transfusion Status OK TO TRANSFUSE    Crossmatch Result Compatible    Unit Number [id number]    Blood Component Type RED CELLS,LR    Unit division 00    Status of Unit ISSUED    Transfusion Status OK TO TRANSFUSE    Crossmatch Result Compatible    Unit Number [id number]    Blood Component Type RED CELLS,LR    Unit division 00    Status of Unit ALLOCATED    Transfusion Status OK  TO TRANSFUSE    Crossmatch Result Compatible   POC occult blood, ED     Status: None   Collection Time: 07/02/16  9:24 PM  Result Value Ref Range   Fecal Occult Bld NEGATIVE NEGATIVE  Urinalysis, Routine w reflex microscopic     Status: Abnormal   Collection Time: 07/02/16  9:27 PM  Result Value Ref Range   Color, Urine RED (A) YELLOW    Comment: BIOCHEMICALS MAY BE AFFECTED BY COLOR   APPearance CLOUDY (A) CLEAR   Specific Gravity, Urine 1.010 1.005 - 1.030   pH >9.0 (H) 5.0 - 8.0   Glucose, UA 100 (A) NEGATIVE mg/dL   Hgb urine dipstick LARGE (A) NEGATIVE   Bilirubin Urine SMALL (A) NEGATIVE   Ketones, ur NEGATIVE NEGATIVE mg/dL   Protein, ur 100 (A) NEGATIVE mg/dL   Nitrite POSITIVE (A) NEGATIVE   Leukocytes, UA LARGE (A) NEGATIVE  Urinalysis, Microscopic (reflex)     Status: Abnormal   Collection Time: 07/02/16  9:27 PM  Result Value Ref Range   RBC / HPF TOO NUMEROUS TO COUNT 0 - 5 RBC/hpf   WBC, UA TOO NUMEROUS TO COUNT 0 - 5 WBC/hpf   Bacteria, UA FEW (A) NONE SEEN   Squamous Epithelial / LPF 0-5 (A) NONE SEEN  Prepare RBC     Status: None   Collection Time: 07/02/16 10:06 PM  Result Value Ref Range   Order Confirmation ORDER PROCESSED BY BLOOD BANK   CBG monitoring, ED     Status: Abnormal   Collection Time: 07/03/16  2:06 AM  Result Value Ref Range   Glucose-Capillary 128 (H) 65 - 99 mg/dL  CBG monitoring, ED     Status: Abnormal   Collection Time: 07/03/16  8:39 AM  Result Value Ref Range   Glucose-Capillary 127 (H) 65 - 99 mg/dL  Basic metabolic panel     Status: Abnormal   Collection Time: 07/03/16  9:10 AM  Result Value Ref Range   Sodium 138 135 - 145 mmol/L   Potassium 3.8 3.5 - 5.1 mmol/L   Chloride 101 101 - 111 mmol/L   CO2 28 22 - 32 mmol/L   Glucose, Bld 164 (H) 65 - 99 mg/dL   BUN 40 (H) 6 - 20 mg/dL   Creatinine, Ser 3.39 (H) 0.61 - 1.24 mg/dL   Calcium 8.9 8.9 - 10.3 mg/dL   GFR calc non Af Amer 16 (L) >60 mL/min   GFR calc Af Amer 19 (L) >60  mL/min    Comment: (NOTE) The eGFR has been calculated using the CKD EPI equation. This calculation has not been validated in all clinical situations. eGFR's persistently <60 mL/min signify possible Chronic Kidney Disease.    Anion gap 9 5 - 15  CBC     Status:  Abnormal   Collection Time: 07/03/16  9:10 AM  Result Value Ref Range   WBC 7.3 4.0 - 10.5 K/uL   RBC 2.98 (L) 4.22 - 5.81 MIL/uL   Hemoglobin 9.1 (L) 13.0 - 17.0 g/dL    Comment: REPEATED TO VERIFY POST TRANSFUSION SPECIMEN    HCT 27.6 (L) 39.0 - 52.0 %   MCV 92.6 78.0 - 100.0 fL   MCH 30.5 26.0 - 34.0 pg   MCHC 33.0 30.0 - 36.0 g/dL   RDW 16.1 (H) 11.5 - 15.5 %   Platelets 127 (L) 150 - 400 K/uL     ROS: see hpi  Physical Exam: Vitals:   07/03/16 0900 07/03/16 0940  BP: 162/71 152/72  Pulse: 85 84  Resp: 23 26  Temp:       General: alert thin chronically ill WM NAD /at baseline MS OX4 HEENT:[place] dry MMM, EOMI , nonicteric  Neck: no jvd , supple Heart: RRR, 1/6 sem, rub or gallop Lungs: CTA . non labored breathing  Abdomen: bs pos. Soft, NT, ND Extremities: nop pedal edema / bilat feet wrapped in large boot bandaged not unwrapped  Skin: no overt rash , warm dry Neuro: Bilat l leg paralysis , moves upper etrem. No acute focal deficits  From his baseline   Dialysis Access: RFA AVF  POs bruit  (not mature to use/ OV 06/26/16   )  R IJ perm cath Nontender, No dc   Dialysis Orders: [facility] . EDW 73.5 kg HD Bath 2k, 2ca  Time 3.5 hrs Heparin 2.300. Access RFA AVF ( Dr. Bridgett Larsson 03/24/16 inserted,  on OV 06/26/16 =if fistula is not fully mature next time will proceed with BAM vs side branch ligation. Merwyn Katos cath     Mircera 225 mcg q 2 weeks last 06/24/16    Assessment/Plan 1. Anemia=(multiple etiologies ) ESRD Anemia, Infection related /  Elba Barman per admit team  GI to see/ check Transfer sat with  Pre hd labs- it will be up since got 3 units PRBCs- on max ESA at kidney center 2. ESRD -  HD MWF  Schedule  k 3.1  on admit k replacement ( use 4k bath on hd today ) fu k  may need change op K bath to 3.0.  Actually going to run him tomorrow instead of today given busy hemo schedule today   3. Hypertension/volume  -  bp stable after 3 u prbcs / per bed wt below edw , no uf on hd today / On Coreg 3.178m bid and Lasix  260mbid ( can dc lasix sec to HD/ ESRD pt)- will UF a little since got 3 units and saline 4. Metabolic bone disease -  Velphoro as Phos binder when eating / no op vit on hd  5.  R ischial pressure ulcer with osteomyelitis per Dr. RoDellia Nimsval  This week - 4weeks Vanco on hd - consult Pharm for vanco  6. Multiple sclerosis with paraplegia\ 7. Neurogenic bladder with chronic indwelling Foley 8. Dm TYPE 2 - PER ADMIT    DaErnest HaberPA-C [id number]/06/2016, 10:40 AM   Patient seen and examined, agree with above note with above modifications. Pt looks fine, feels well.  Must be losing blood somewhere, is on max ESA therapy so not a lot we can do- will arrange for the vancomycin to be given with HD for osteo KeCorliss ParishMD 07/03/2016

## 2016-07-03 NOTE — Consult Note (Signed)
Referring Provider: Dr. Tana Coast Primary Care Physician:  [name], MD Primary Gastroenterologist:  Althia Forts  Reason for Consultation:  Anemia  HPI: Lance [name] is a 76 y.o. male with multiple medical problems as stated below being seen for a consult due to acute on chronic anemia with a Hgb 3.7 in the setting of a reported bleed from a left hip wound prior to admit. Heme negative. Denies melena or hematochezia. Baseline Hgb 7-9 and currently 9.5. Denies abdominal pain, N/V, GERD. Reports intermittent problems swallowing for years. ESRD on dialysis. Unable to walk for years with paraplegia and multiple sclerosis. Was on prophylactic doses of Lovenox prior to admit. Reports last colonoscopy was over 10 years ago (records not available at this time).  Past Medical History:  Diagnosis Date  . Acute CHF (congestive heart failure) ([place]) 02/14/2016  . Acute osteomyelitis, pelvis, right ([place])   . Acute renal failure ([place])   . Anemia in chronic renal disease   . Blood transfusion   . Cardiomyopathy ([place]) 02/17/2016  . Chronic spastic paraplegia (HCC)    secondary to MS  . CKD (chronic kidney disease), stage III   . Decreased sensation of lower extremity    due to MS  . Decubitus ulcer of ischium   . Decubitus ulcer of trochanter, stage 4 ([place]) 06/14/2015  . History of MRSA infection    urine  . History of recurrent UTIs   . Hypertension   . MS (multiple sclerosis) ([place])   . Neuralgia neuritis, sciatic nerve    MS for 30 years  . Neurogenic bladder   . PONV (postoperative nausea and vomiting)   . Self-catheterizes urinary bladder   . Type 2 diabetes mellitus ([place])     Past Surgical History:  Procedure Laterality Date  . APPLICATION OF A-CELL OF EXTREMITY Left 09/05/2014   Procedure: PLACEMENT OF ACELL AND VAC;  Surgeon: Theodoro Kos, DO;  Location: [place];  Service: Plastics;  Laterality: Left;  . BASCILIC VEIN TRANSPOSITION Right 03/24/2016   Procedure: RADIAL- CEPHALIC  FISTULA CREATION RIGHT ARM;  Surgeon: Conrad [place], MD;  Location: [place];  Service: Vascular;  Laterality: Right;  . EXTRACORPOREAL SHOCK WAVE LITHOTRIPSY  03-23-2011  . FINGER SURGERY  2004  . INCISION AND DRAINAGE OF WOUND Left 09/05/2014   Procedure: IRRIGATION AND DEBRIDEMENT OF LEFT ISCHIUM WOUND WITH ;  Surgeon: Theodoro Kos, DO;  Location: [place];  Service: Plastics;  Laterality: Left;  . INSERTION OF DIALYSIS CATHETER N/A 02/27/2016   Procedure: INSERTION OF DIALYSIS CATHETER-RIGHT INTERNAL JUGULA PLACEMENT;  Surgeon: Conrad Lochsloy, MD;  Location: [place];  Service: Vascular;  Laterality: N/A;  . TONSILLECTOMY    . WOUND DEBRIDEMENT     [facility]/2/17[facility]     Prior to Admission medications   Medication Sig Start Date End Date Taking? Authorizing Provider  acetaminophen (TYLENOL) 325 MG tablet Take 650 mg by mouth every 6 (six) hours as needed for moderate pain or fever.    Yes Historical Provider, MD  Amino Acids-Protein Hydrolys (FEEDING SUPPLEMENT, PRO-STAT SUGAR FREE 64,) LIQD Take 47ml by mouth twice daily for wound healing   Yes Historical Provider, MD  aspirin 81 MG tablet Take 1 tablet (81 mg total) by mouth daily. 03/30/11  Yes Rolan Bucco, MD  b complex-vitamin c-folic acid (NEPHRO-VITE) 0.8 MG TABS tablet Take 1 tablet by mouth every morning.    Yes Historical Provider, MD  baclofen (LIORESAL) 10 MG tablet Take 10 mg by mouth at bedtime as  needed for muscle spasms.   Yes Historical Provider, MD  bisacodyl (DULCOLAX) 10 MG suppository Place 10 mg rectally as needed for moderate constipation.   Yes Historical Provider, MD  carvedilol (COREG) 3.125 MG tablet Take 3.125 mg by mouth 2 (two) times daily with a meal.   Yes Historical Provider, MD  famotidine (PEPCID) 20 MG tablet Take 20 mg by mouth 2 (two) times daily.   Yes Historical Provider, MD  ferrous sulfate 325 (65 FE) MG tablet Take 325 mg by mouth 2 (two) times daily with a meal.   Yes Historical Provider, MD  furosemide (LASIX)  20 MG tablet Take 20 mg by mouth 2 (two) times daily.   Yes Historical Provider, MD  insulin glargine (LANTUS) 100 UNIT/ML injection Inject 12 Units into the skin every morning.    Yes Historical Provider, MD  magnesium hydroxide (MILK OF MAGNESIA) 400 MG/5ML suspension Take 30 mLs by mouth daily as needed for mild constipation.   Yes Historical Provider, MD  Melatonin 3 MG TABS Take 3 mg by mouth at bedtime.   Yes Historical Provider, MD  Nutritional Supplements (NEPRO) LIQD Take 237 mLs by mouth 2 (two) times daily. One can by mouth twice daily for supplement/wound healing    Yes Historical Provider, MD  oxybutynin (DITROPAN) 5 MG tablet Take 5 mg by mouth 2 (two) times daily.    Yes Historical Provider, MD  polyethylene glycol (MIRALAX / GLYCOLAX) packet Take 17 g by mouth daily as needed for mild constipation. 02/27/16  Yes Nita Sells, MD  promethazine (PHENERGAN) 25 MG tablet Take 25 mg by mouth every 6 (six) hours as needed for nausea or vomiting.   Yes Historical Provider, MD  sertraline (ZOLOFT) 50 MG tablet Take 50 mg by mouth at bedtime.  04/23/16  Yes Hendricks Limes, MD  Skin Protectants, Misc. (EUCERIN) cream Apply 1 application topically daily. For feet   Yes Historical Provider, MD  Sodium Phosphates (RA SALINE ENEMA) 19-7 GM/118ML ENEM Place 1 application rectally once.   Yes Historical Provider, MD  sucroferric oxyhydroxide (VELPHORO) 500 MG chewable tablet Chew 1,000 mg by mouth 3 (three) times daily with meals.   Yes Historical Provider, MD  traMADol (ULTRAM) 50 MG tablet Take 50 mg by mouth every 6 (six) hours as needed for severe pain.    Yes Historical Provider, MD  enoxaparin (LOVENOX) 30 MG/0.3ML injection Inject 30 mg into the skin daily.    Historical Provider, MD    Scheduled Meds: . carvedilol  3.125 mg Oral BID WC  . famotidine  20 mg Oral BID  . feeding supplement  1 Container Oral BID BM  . feeding supplement (PRO-STAT SUGAR FREE 64)  30 mL Oral BID  .  ferrous sulfate  325 mg Oral BID WC  . insulin aspart  0-5 Units Subcutaneous QHS  . insulin aspart  0-9 Units Subcutaneous TID WC  . insulin glargine  12 Units Subcutaneous Daily  . Melatonin  3 mg Oral QHS  . oxybutynin  5 mg Oral BID  . sertraline  50 mg Oral QHS  . [START ON 07/04/2016] vancomycin  750 mg Intravenous Q T,Th,Sa-HD  . [START ON 07/06/2016] vancomycin  750 mg Intravenous Q M,W,F-HD   Continuous Infusions: PRN Meds:.albuterol, bisacodyl, promethazine, sodium phosphate, traMADol  Allergies as of 07/02/2016  . (No Known Allergies)    Family History  Problem Relation Age of Onset  . Stroke Father   . Diabetes Father   . Diabetes  Paternal Grandmother   . Stroke Paternal Grandfather   . Heart disease Neg Hx   . Cancer Neg Hx     Social History   Social History  . Marital status: Married    Spouse name: N/A  . Number of children: N/A  . Years of education: N/A   Occupational History  . Not on file.   Social History Main Topics  . Smoking status: Former Smoker    Types: Cigars    Quit date: 05/06/1983  . Smokeless tobacco: Never Used  . Alcohol use 0.6 oz/week    1 Glasses of wine per week     Comment: pt has not had any since Dec 2017  . Drug use: No  . Sexual activity: Not Currently    Birth control/ protection: None   Other Topics Concern  . Not on file   Social History Narrative  . No narrative on file    Review of Systems: All negative except as stated above in HPI.  Physical Exam: Vital signs: Vitals:   07/03/16 1130 07/03/16 1228  BP:  129/63  Pulse: 75 68  Resp: 13 18  Temp:  97.7 F (36.5 C)   Last BM Date:  (PTA) General:   Alert, thin, elderly, frail, contractures, no acute distress HEENT: anicteric sclera, oropharynx clear Lungs:  Clear throughout to auscultation.   No wheezes, crackles, or rhonchi. No acute distress. Heart:  Regular rate and rhythm; no murmurs, clicks, rubs,  or gallops. Abdomen: soft, nontender, nondistended,  +BS  Rectal:  Deferred Ext: contractures of legs  GI:  Lab Results:  Recent Labs  07/02/16 07/02/16 2107 07/03/16 0910 07/03/16 1448  WBC 6.9 9.2 7.3  --   HGB 5.9* 3.7* 9.1* 9.5*  HCT 18* 12.2* 27.6* 29.5*  PLT 136* 151 127*  --    BMET  Recent Labs  07/02/16 2107 07/03/16 0910  NA 138 138  K 3.3* 3.8  CL 96* 101  CO2 32 28  GLUCOSE 175* 164*  BUN 36* 40*  CREATININE 3.19* 3.39*  CALCIUM 9.0 8.9   LFT  Recent Labs  07/02/16 2107  PROT 7.5  ALBUMIN 2.1*  AST 18  ALT 15*  ALKPHOS 60  BILITOT 0.3   PT/INR No results for input(s): LABPROT, INR in the last 72 hours.   Studies/Results: No results found.  Impression/Plan: 76 yo with multiple medical problems who had severe anemia of 3.7 without any overt GI bleeding and heme negative. Would manage conservatively and would NOT recommend any invasive GI procedures. Hgb is above baseline and no visible bleeding. Severe anemia may have been related to the hip wound that reportedly bled. Advance diet per primary team recs. No f/u with GI needed. Will sign off. Call us back if needed.    LOS: 1 day   [place] C.  07/03/2016, 5:33 PM  Pager [phone]  If no answer or after 5 PM call [phone]

## 2016-07-03 NOTE — ED Notes (Signed)
Trouble w/ pump, blood not started until 401am.  15 minute check at 416.

## 2016-07-03 NOTE — ED Notes (Signed)
Pt. Transferred to 6 E.  Stable upon transfer.  Pt. Was talking on the phone.

## 2016-07-03 NOTE — Progress Notes (Signed)
Pharmacy Antibiotic Note  Lance [name] is a 76 y.o. male with ESRD admitted on 07/02/2016 with low Hgb requiring transfusions. The patient had been staying at [facility] for cure of L-gluteal decubitus ulcer with a wound vac. Imaging show chronic osteo and recent cultures grew MRSA. Per Dr. Janalyn Rouse note on 2/27 - the plan was for the patient to start Vancomycin at HD for a planned 4 week duration. Pharmacy has been consulted for Vancomycin.  It is unclear if the patient has received any doses of Vancomycin thus far so will plan to check a random level prior to initiation. The patient is normally MWF however plans are not to dialyze the patient today due to a busy schedule and will dialyze off-schedule on Sat, 3/3.    Plan: 1. Obtain a Vancomycin random level 2. Pending level results - will plan to start Vancomycin - with noted plans to dialyze off-schedule on 3/3  Height: 6\' 4"  (193 cm) Weight: 157 lb (71.2 kg) IBW/kg (Calculated) : 86.8  Temp (24hrs), Avg:98.2 F (36.8 C), Min:97.5 F (36.4 C), Max:98.8 F (37.1 C)   Recent Labs Lab 07/02/16 07/02/16 2107 07/03/16 0910  WBC 6.9 9.2 7.3  CREATININE  --  3.19* 3.39*    Estimated Creatinine Clearance: 19 mL/min (by C-G formula based on SCr of 3.39 mg/dL (H)).    No Known Allergies  Antimicrobials this admission:  Dose adjustments this admission:  Microbiology results:  Thank you for allowing pharmacy to be a part of this patient's care.  Alycia Rossetti, PharmD, BCPS Clinical Pharmacist Pager: [phone] Clinical phone for 07/03/2016 from 7a-3:30p: [phone] If after 3:30p, please call main pharmacy at: [phone] 07/03/2016 5:08 PM

## 2016-07-03 NOTE — Progress Notes (Signed)
[facility]                                                                              Patient Demographics  Lance [name], is a 76 y.o. male, [name] - [birth year], VVO:[id number]  Admit date - 07/02/2016   Admitting Physician Norval Morton, MD  Outpatient Primary MD for the patient is [name], MD  Outpatient specialists:   LOS - 1  days    Chief Complaint  Patient presents with  . Abnormal Lab       Brief summary   LARWENCE [name] is a 76 y.o. male with medical history significant of ESRD on HD(M/W/F), DM type 2, multiple sclerosis with paraplegia, chronic anemia, decubitus ulcer, CHF, and neurogenic bladder with chronic catheter; who presents with complaints of low hemoglobin count. Patient reports that his hemoglobin last week was 7.7 g/dL and normally is between 7-9 g/dL. He had been staying at [facility] for cure of a left gluteal decubitus ulcer. There is a wound VAC over the ulcer but patient notes that there has been very minimal drainage. Over the last week his hemoglobin was noted to drop when checked at SNF 2 days ago his hemoglobin was 6.5 and then today noted to be 5.5. He had been on Lovenox injections daily for DVT prophylaxis, but this had been stopped after initial drop. Associated symptoms include generalized fatigue, constipation, dark stools(on iron supplementation), and occasional nausea/nonbloody emesis with dialysis. Denies any significant chest pain, shortness of breath, fever, or chills.  He has required transfusions in the past and notes that his last transfusion was some time in 04/2016, but his hemoglobin has never dropped as low. Patient reports that his last colonoscopy was likely more than 10 years ago.    Assessment & Plan    Principal Problem: Acute on chronic Symptomatic anemia -  hemoglobin 3.7 on admission. Stool guaiac negative on admission. Patient also reported that 3 days ago prior to admission he had significant  bleeding from the left hip wound. Nursing applied pressure for 15 minutes to control bleeding. Lovenox was discontinued which she had been receiving for DVT prophylaxis. Patient himself did not report any hematochezia or melena. - Patient received 3 units packed RBCs, hemoglobin now stable at 9.1  - No recent GI workup, per patient colonoscopy more than 10 years ago - Clear liquid diet for now, serial H&H, GI consulted - Both sacral wounds inspected, no bleeding currently  Active problems ESRD on HD(M/W/F) - Nephrology consulted, and discussed with Dr. Clover Mealy  Left decubitus wound and chronic Osteomyelitis:  Patient reports that Dr. Dellia Nims had wanted him to started on vancomycin during dialysis treatments from recent bone biopsy that showed light growth of MRSA. See nursing home note from 06/30/2016. - MRI in 10/17 had shown large ulceration on the right issue tuberosity extending to the bone compatible with ostium mellitus, patient has a wound VAC - Low air loss mattress,  Wound care consult  - Vancomycin per pharmacy with hemodialysis  Multiple sclerosis with paraplegia: -  At baseline patient reports being bedbound, and is intermittently transported in a wheelchair. - Continue baclofen,  oxybutynin   Neurogenic bladder with chronic indwelling Foley :Patient with previous history of recurrent UTIs. - UA positive for UTI however can have colonization, no fevers, leukocytosis currently - If patient spikes any fevers, will add IV every Rocephin or Fortaz with HD, for now obtain urine culture and sensitivities  Diabetes mellitus type 2 - Continue home Lantus dose  - CBGs with sensitive SSI  Depression - Continue Zoloft  Essential hypertension - Continue home Coreg and Lasix  Gerd - Continue Pepcid, place on PPI   Code Status: full  DVT Prophylaxis:  SCD's Family Communication: Discussed in detail with the patient, all imaging results, lab results explained to the patient     Disposition Plan  Time Spent in minutes  25 minutes  Procedures:  HD  Consultants:   Renal   Antimicrobials:   Vancomycin   Medications  Scheduled Meds: . carvedilol  3.125 mg Oral BID WC  . famotidine  20 mg Oral BID  . feeding supplement (PRO-STAT SUGAR FREE 64)  30 mL Oral BID  . ferrous sulfate  325 mg Oral BID WC  . insulin aspart  0-5 Units Subcutaneous QHS  . insulin aspart  0-9 Units Subcutaneous TID WC  . insulin glargine  12 Units Subcutaneous Daily  . Melatonin  3 mg Oral QHS  . oxybutynin  5 mg Oral BID  . sertraline  50 mg Oral QHS   Continuous Infusions: PRN Meds:.albuterol, bisacodyl, promethazine, sodium phosphate, traMADol   Antibiotics   Anti-infectives    None        Subjective:   Yeray Brisbon was seen and examined today. Currently no complaints.  Patient denies dizziness, chest pain, shortness of breath, abdominal pain, N/V/D/C, new weakness, numbess, tingling. No fevers or chills.  Objective:   Vitals:   07/03/16 0900 07/03/16 0940 07/03/16 1130 07/03/16 1228  BP: 162/71 152/72  129/63  Pulse: 85 84 75 68  Resp: 23 26 13 18   Temp:    97.7 F (36.5 C)  TempSrc:    Oral  SpO2: 96% 97% 98% 97%  Weight:    71.2 kg (157 lb)  Height:        Intake/Output Summary (Last 24 hours) at 07/03/16 1415 Last data filed at 07/03/16 0934  Gross per 24 hour  Intake          2807.67 ml  Output                0 ml  Net          2807.67 ml     Wt Readings from Last 3 Encounters:  07/03/16 71.2 kg (157 lb)  07/02/16 73.9 kg (163 lb)  06/26/16 73.9 kg (163 lb)     Exam  General: Alert and oriented x 3, NAD  HEENT:  PERRLA, EOMI, Anicteric Sclera, mucous membranes moist.   Neck: Supple, no JVD, no masses  Cardiovascular: S1 S2 auscultated, no rubs, murmurs or gallops. Regular rate and rhythm.  Respiratory: Clear to auscultation bilaterally, no wheezing, rales or rhonchi  Gastrointestinal: Soft, nontender, nondistended, +  bowel sounds  Ext: no cyanosis clubbing or edema  Neuro: Paraplegic  Skin: Stage IV pressure injury to the right ischium with wound VAC, left ischium 1 x 1.5cm   Psych: Normal affect and demeanor, alert and oriented x3    Data Reviewed:  I have personally reviewed following labs and imaging studies  Micro Results Recent Results (from the past 240 hour(s))  MRSA PCR Screening  Status: None   Collection Time: 07/03/16 12:19 PM  Result Value Ref Range Status   MRSA by PCR NEGATIVE NEGATIVE Final    Comment:        The GeneXpert MRSA Assay (FDA approved for NASAL specimens only), is one component of a comprehensive MRSA colonization surveillance program. It is not intended to diagnose MRSA infection nor to guide or monitor treatment for MRSA infections.     Radiology Reports No results found.  Lab Data:  CBC:  Recent Labs Lab 07/02/16 07/02/16 2107 07/03/16 0910  WBC 6.9 9.2 7.3  NEUTROABS  --  4.7  --   HGB 5.9* 3.7* 9.1*  HCT 18* 12.2* 27.6*  MCV  --  97.6 92.6  PLT 136* 151 518*   Basic Metabolic Panel:  Recent Labs Lab 07/02/16 2107 07/03/16 0910  NA 138 138  K 3.3* 3.8  CL 96* 101  CO2 32 28  GLUCOSE 175* 164*  BUN 36* 40*  CREATININE 3.19* 3.39*  CALCIUM 9.0 8.9   GFR: Estimated Creatinine Clearance: 19 mL/min (by C-G formula based on SCr of 3.39 mg/dL (H)). Liver Function Tests:  Recent Labs Lab 07/02/16 2107  AST 18  ALT 15*  ALKPHOS 60  BILITOT 0.3  PROT 7.5  ALBUMIN 2.1*   No results for input(s): LIPASE, AMYLASE in the last 168 hours. No results for input(s): AMMONIA in the last 168 hours. Coagulation Profile: No results for input(s): INR, PROTIME in the last 168 hours. Cardiac Enzymes: No results for input(s): CKTOTAL, CKMB, CKMBINDEX, TROPONINI in the last 168 hours. BNP (last 3 results) No results for input(s): PROBNP in the last 8760 hours. HbA1C: No results for input(s): HGBA1C in the last 72 hours. CBG:  Recent  Labs Lab 07/03/16 0206 07/03/16 0839 07/03/16 1220  GLUCAP 128* 127* 117*   Lipid Profile: No results for input(s): CHOL, HDL, LDLCALC, TRIG, CHOLHDL, LDLDIRECT in the last 72 hours. Thyroid Function Tests: No results for input(s): TSH, T4TOTAL, FREET4, T3FREE, THYROIDAB in the last 72 hours. Anemia Panel: No results for input(s): VITAMINB12, FOLATE, FERRITIN, TIBC, IRON, RETICCTPCT in the last 72 hours. Urine analysis:    Component Value Date/Time   COLORURINE RED (A) 07/02/2016 2127   APPEARANCEUR CLOUDY (A) 07/02/2016 2127   LABSPEC 1.010 07/02/2016 2127   PHURINE >9.0 (H) 07/02/2016 2127   GLUCOSEU 100 (A) 07/02/2016 2127   HGBUR LARGE (A) 07/02/2016 2127   BILIRUBINUR SMALL (A) 07/02/2016 2127   KETONESUR NEGATIVE 07/02/2016 2127   PROTEINUR 100 (A) 07/02/2016 2127   UROBILINOGEN 0.2 08/30/2014 2203   NITRITE POSITIVE (A) 07/02/2016 2127   LEUKOCYTESUR LARGE (A) 07/02/2016 2127     Jamorris Ndiaye M.D. [facility] 07/03/2016, 2:15 PM  Pager: [phone] Between 7am to 7pm - call Pager - [phone]  After 7pm go to [email or web address] - password [email or web address]  Call night coverage person covering after 7pm

## 2016-07-03 NOTE — Consult Note (Signed)
[place] Nurse wound consult note Reason for Consult: pressure injuries Patient from SNF with osteomyelitis known in the right ischium  With exposed bone and bone bx + MRSA per review of the records.  Patient has been followed by [facility] wound center and has had other surgical interventions for his wounds in the past.   Wound type: Chronic Stage 4 Pressure injury right ischium: per record review.  Unstageable Pressure injury left ishcium Pressure Injury POA: Yes Measurement: Left ischium: 1.0cm x 1.5cm x 0.1cm  Right ischium was not assessed today Wound bed: Left ischium: 100% yellow Drainage (amount, consistency, odor) none from the left ischium Periwound: intact with evidence of scarring from previous skin ulcerations and surgery Dressing procedure/placement/frequency: 1. Enzymatic debridement for the left ischium, cover with dry dressing 2. Low air loss mattress for pressure redistribution 3. Leave PICO in place, was just placed in facility yesterday.  I will plan to remove on Monday, if removed by nursing staff before Monday I have left orders to place KCI VAC and NPWT dressing. 4. Maximize nutrition for wound healing.  [place] nurse team will follow along with you for support with wound care as needed.  Estell [place], [place]

## 2016-07-04 DIAGNOSIS — E440 Moderate protein-calorie malnutrition: Secondary | ICD-10-CM

## 2016-07-04 LAB — RENAL FUNCTION PANEL
ALBUMIN: 2.1 g/dL — AB (ref 3.5–5.0)
Anion gap: 7 (ref 5–15)
BUN: 54 mg/dL — AB (ref 6–20)
CO2: 30 mmol/L (ref 22–32)
Calcium: 9.1 mg/dL (ref 8.9–10.3)
Chloride: 102 mmol/L (ref 101–111)
Creatinine, Ser: 4.03 mg/dL — ABNORMAL HIGH (ref 0.61–1.24)
GFR calc Af Amer: 15 mL/min — ABNORMAL LOW (ref >60)
GFR calc non Af Amer: 13 mL/min — ABNORMAL LOW (ref >60)
Glucose, Bld: 84 mg/dL (ref 65–99)
PHOSPHORUS: 2.5 mg/dL (ref 2.5–4.6)
POTASSIUM: 3.6 mmol/L (ref 3.5–5.1)
SODIUM: 139 mmol/L (ref 135–145)

## 2016-07-04 LAB — CBC
HCT: 28.7 % — ABNORMAL LOW (ref 39.0–52.0)
Hemoglobin: 9.2 g/dL — ABNORMAL LOW (ref 13.0–17.0)
MCH: 29.9 pg (ref 26.0–34.0)
MCHC: 32.1 g/dL (ref 30.0–36.0)
MCV: 93.2 fL (ref 78.0–100.0)
PLATELETS: 160 10*3/uL (ref 150–400)
RBC: 3.08 MIL/uL — ABNORMAL LOW (ref 4.22–5.81)
RDW: 16 % — AB (ref 11.5–15.5)
WBC: 9.2 10*3/uL (ref 4.0–10.5)

## 2016-07-04 LAB — BASIC METABOLIC PANEL
ANION GAP: 9 (ref 5–15)
BUN: 54 mg/dL — ABNORMAL HIGH (ref 6–20)
CALCIUM: 9.1 mg/dL (ref 8.9–10.3)
CO2: 29 mmol/L (ref 22–32)
Chloride: 101 mmol/L (ref 101–111)
Creatinine, Ser: 4.04 mg/dL — ABNORMAL HIGH (ref 0.61–1.24)
GFR, EST AFRICAN AMERICAN: 15 mL/min — AB (ref >60)
GFR, EST NON AFRICAN AMERICAN: 13 mL/min — AB (ref >60)
Glucose, Bld: 84 mg/dL (ref 65–99)
Potassium: 3.6 mmol/L (ref 3.5–5.1)
Sodium: 139 mmol/L (ref 135–145)

## 2016-07-04 LAB — GLUCOSE, CAPILLARY
GLUCOSE-CAPILLARY: 72 mg/dL (ref 65–99)
GLUCOSE-CAPILLARY: 171 mg/dL — AB (ref 65–99)
Glucose-Capillary: 101 mg/dL — ABNORMAL HIGH (ref 65–99)
Glucose-Capillary: 212 mg/dL — ABNORMAL HIGH (ref 65–99)

## 2016-07-04 LAB — URINE CULTURE

## 2016-07-04 LAB — CALCIUM, IONIZED: Calcium, Ionized, Serum: 5.4 mg/dL (ref 4.5–5.6)

## 2016-07-04 LAB — HEPATITIS B SURFACE ANTIGEN

## 2016-07-04 LAB — HEPATITIS B SURFACE AG, CONFIRM: HBsAg Confirmation: POSITIVE — AB

## 2016-07-04 MED ORDER — COLLAGENASE 250 UNIT/GM EX OINT
TOPICAL_OINTMENT | Freq: Every day | CUTANEOUS | Status: DC
  Administered 2016-07-04: 23:00:00 via TOPICAL
  Filled 2016-07-04: qty 30

## 2016-07-04 MED ORDER — NEPRO/CARBSTEADY PO LIQD
237.0000 mL | Freq: Three times a day (TID) | ORAL | Status: DC
  Administered 2016-07-04 – 2016-07-05 (×2): 237 mL via ORAL
  Filled 2016-07-04 (×3): qty 237

## 2016-07-04 MED ORDER — DARBEPOETIN ALFA 200 MCG/0.4ML IJ SOSY: 200.0000 ug | PREFILLED_SYRINGE | INTRAMUSCULAR | Status: DC

## 2016-07-04 MED ORDER — VANCOMYCIN HCL IN DEXTROSE 750-5 MG/150ML-% IV SOLN
750.0000 mg | Freq: Once | INTRAVENOUS | Status: AC
  Administered 2016-07-04: 750 mg via INTRAVENOUS
  Filled 2016-07-04: qty 150

## 2016-07-04 NOTE — Progress Notes (Signed)
[facility]                                                                              Patient Demographics  Lance [name], is a 76 y.o. male, [name] - 05/26/1940, AQT:[id number]  Admit date - 07/02/2016   Admitting Physician Norval Morton, MD  Outpatient Primary MD for the patient is [name], MD  Outpatient specialists:   LOS - 2  days    Chief Complaint  Patient presents with  . Abnormal Lab       Brief summary   Lance [name] is a 76 y.o. male with medical history significant of ESRD on HD(M/W/F), DM type 2, multiple sclerosis with paraplegia, chronic anemia, decubitus ulcer, CHF, and neurogenic bladder with chronic catheter; who presents with complaints of low hemoglobin count. Patient reports that his hemoglobin last week was 7.7 g/dL and normally is between 7-9 g/dL. He had been staying at [facility] for cure of a left gluteal decubitus ulcer. There is a wound VAC over the ulcer but patient notes that there has been very minimal drainage. Over the last week his hemoglobin was noted to drop when checked at SNF 2 days ago his hemoglobin was 6.5 and then today noted to be 5.5. He had been on Lovenox injections daily for DVT prophylaxis, but this had been stopped after initial drop. Associated symptoms include generalized fatigue, constipation, dark stools(on iron supplementation), and occasional nausea/nonbloody emesis with dialysis. Denies any significant chest pain, shortness of breath, fever, or chills.  He has required transfusions in the past and notes that his last transfusion was some time in 04/2016, but his hemoglobin has never dropped as low. Patient reports that his last colonoscopy was likely more than 10 years ago.    Assessment & Plan    Principal Problem: Acute on chronic Symptomatic anemia -  hemoglobin 3.7 on admission. Stool guaiac negative on admission. Patient also reported that 3 days ago prior to admission he had significant  bleeding from the left hip wound. Nursing applied pressure for 15 minutes to control bleeding. Lovenox was discontinued which she had been receiving for DVT prophylaxis. Patient himself did not report any hematochezia or melena. - Patient received 3 units packed RBCs, hemoglobin now stable at 9.2 -  Both sacral wounds inspected, no bleeding currently - GI was consulted, recommended no invasive GI procedures, heme-negative  Active problems ESRD on HD(M/W/F) - Nephrology consulted, undergoing HD today  Left decubitus wound and chronic Osteomyelitis:  Patient reports that Dr. Dellia Nims had wanted him to started on vancomycin during dialysis treatments from recent bone biopsy that showed light growth of MRSA. See nursing home note from 06/30/2016. - MRI in 10/17 had shown large ulceration on the right issue tuberosity extending to the bone compatible with ostium mellitus, patient has a wound VAC - Low air loss mattress,  Wound care consulted  - Vancomycin per pharmacy with hemodialysis  Multiple sclerosis with paraplegia: -  At baseline patient reports being bedbound, and is intermittently transported in a wheelchair. - Continue baclofen, oxybutynin   Neurogenic bladder with chronic indwelling Foley :Patient with previous history of recurrent  UTIs. - UA positive for UTI however can have colonization, no fevers, leukocytosis currently - If patient spikes any fevers, will add IV every Rocephin or Fortaz with HD, for now obtain urine culture and sensitivities - Urine culture with colonization, multiples species  Diabetes mellitus type 2 - Continue home Lantus dose  - CBGs with sensitive SSI  Depression - Continue Zoloft  Essential hypertension - Continue home Coreg and Lasix  Gerd - Continue Pepcid, place on PPI   Code Status: full  DVT Prophylaxis:  SCD's Family Communication: Discussed in detail with the patient, all imaging results, lab results explained to the patient     Disposition Plan: Possible DC in a.m.  Time Spent in minutes  25 minutes  Procedures:  HD  Consultants:   Renal   Antimicrobials:   Vancomycin   Medications  Scheduled Meds: . carvedilol  3.125 mg Oral BID WC  . [START ON 07/08/2016] darbepoetin (ARANESP) injection - DIALYSIS  200 mcg Intravenous Q Wed-HD  . famotidine  20 mg Oral BID  . feeding supplement (NEPRO CARB STEADY)  237 mL Oral TID WC  . feeding supplement (PRO-STAT SUGAR FREE 64)  30 mL Oral BID  . insulin aspart  0-5 Units Subcutaneous QHS  . insulin aspart  0-9 Units Subcutaneous TID WC  . insulin glargine  12 Units Subcutaneous Daily  . Melatonin  3 mg Oral QHS  . oxybutynin  5 mg Oral BID  . sertraline  50 mg Oral QHS  . vancomycin  750 mg Intravenous Q T,Th,Sa-HD  . [START ON 07/06/2016] vancomycin  750 mg Intravenous Q M,W,F-HD  . vancomycin  750 mg Intravenous Once   Continuous Infusions: PRN Meds:.albuterol, bisacodyl, promethazine, sodium phosphate, traMADol   Antibiotics   Anti-infectives    Start     Dose/Rate Route Frequency Ordered Stop   07/06/16 1200  vancomycin (VANCOCIN) IVPB 750 mg/150 ml premix     750 mg 150 mL/hr over 60 Minutes Intravenous Every M-W-F (Hemodialysis) 07/03/16 1712     07/04/16 1500  vancomycin (VANCOCIN) IVPB 750 mg/150 ml premix     750 mg 150 mL/hr over 60 Minutes Intravenous  Once 07/04/16 1304     07/04/16 1200  vancomycin (VANCOCIN) IVPB 750 mg/150 ml premix     750 mg 150 mL/hr over 60 Minutes Intravenous Every T-Th-Sa (Hemodialysis) 07/03/16 1712 07/07/16 1159   07/03/16 2030  vancomycin (VANCOCIN) 1,500 mg in sodium chloride 0.9 % 250 mL IVPB     1,500 mg 250 mL/hr over 60 Minutes Intravenous  Once 07/03/16 2002 07/03/16 2207   07/03/16 1800  vancomycin (VANCOCIN) 1,500 mg in sodium chloride 0.9 % 250 mL IVPB  Status:  Discontinued     1,500 mg 250 mL/hr over 60 Minutes Intravenous  Once 07/03/16 1712 07/03/16 1716        Subjective:   Javius  Hamada was seen and examined today. Seen in HD today, denies any complaints, feeling better today. Patient denied any dizziness, chest pain, shortness of breath, abdominal pain, N/V/D/C, new weakness, numbess, tingling. No fevers or chills.  Objective:   Vitals:   07/04/16 1130 07/04/16 1200 07/04/16 1230 07/04/16 1234  BP: 108/60 (!) 108/58 107/63 120/62  Pulse: 92 95 95 89  Resp: (!) 22 16 (!) 25 20  Temp:    98.2 F (36.8 C)  TempSrc:    Oral  SpO2: 97% 95% 97% 98%  Weight:    74.9 kg (165 lb 2 oz)  Height:  Intake/Output Summary (Last 24 hours) at 07/04/16 1405 Last data filed at 07/04/16 1355  Gross per 24 hour  Intake              730 ml  Output             1400 ml  Net             -670 ml     Wt Readings from Last 3 Encounters:  07/04/16 74.9 kg (165 lb 2 oz)  07/02/16 73.9 kg (163 lb)  06/26/16 73.9 kg (163 lb)     Exam  General: Alert and oriented x 3, NAD  HEENT:    Neck: Supple, no JVD, no masses  Cardiovascular: S1 S2 Clear, RRR  Respiratory: Clear to auscultation bilaterally, no wheezing, rales or rhonchi  Gastrointestinal: Soft, nontender, nondistended, + bowel sounds  Ext: no cyanosis clubbing or edema  Neuro: Paraplegic  Skin: Stage IV pressure injury to the right ischium with wound VAC, left ischium 1 x 1.5cm   Psych: Normal affect and demeanor, alert and oriented x3    Data Reviewed:  I have personally reviewed following labs and imaging studies  Micro Results Recent Results (from the past 240 hour(s))  MRSA PCR Screening     Status: None   Collection Time: 07/03/16 12:19 PM  Result Value Ref Range Status   MRSA by PCR NEGATIVE NEGATIVE Final    Comment:        The GeneXpert MRSA Assay (FDA approved for NASAL specimens only), is one component of a comprehensive MRSA colonization surveillance program. It is not intended to diagnose MRSA infection nor to guide or monitor treatment for MRSA infections.   Urine culture      Status: Abnormal   Collection Time: 07/03/16  2:27 PM  Result Value Ref Range Status   Specimen Description URINE, RANDOM  Final   Special Requests NONE  Final   Culture MULTIPLE SPECIES PRESENT, SUGGEST RECOLLECTION (A)  Final   Report Status 07/04/2016 FINAL  Final    Radiology Reports No results found.  Lab Data:  CBC:  Recent Labs Lab 07/02/16 07/02/16 2107 07/03/16 0910 07/03/16 1448 07/04/16 0908  WBC 6.9 9.2 7.3  --  9.2  NEUTROABS  --  4.7  --   --   --   HGB 5.9* 3.7* 9.1* 9.5* 9.2*  HCT 18* 12.2* 27.6* 29.5* 28.7*  MCV  --  97.6 92.6  --  93.2  PLT 136* 151 127*  --  623   Basic Metabolic Panel:  Recent Labs Lab 07/02/16 2107 07/03/16 0910 07/04/16 0908  NA 138 138 139  139  K 3.3* 3.8 3.6  3.6  CL 96* 101 101  102  CO2 32 28 29  30   GLUCOSE 175* 164* 84  84  BUN 36* 40* 54*  54*  CREATININE 3.19* 3.39* 4.04*  4.03*  CALCIUM 9.0 8.9 9.1  9.1  PHOS  --   --  2.5   GFR: Estimated Creatinine Clearance: 16.7 mL/min (by C-G formula based on SCr of 4.04 mg/dL (H)). Liver Function Tests:  Recent Labs Lab 07/02/16 2107 07/04/16 0908  AST 18  --   ALT 15*  --   ALKPHOS 60  --   BILITOT 0.3  --   PROT 7.5  --   ALBUMIN 2.1* 2.1*   No results for input(s): LIPASE, AMYLASE in the last 168 hours. No results for input(s): AMMONIA in the last 168 hours. Coagulation  Profile: No results for input(s): INR, PROTIME in the last 168 hours. Cardiac Enzymes: No results for input(s): CKTOTAL, CKMB, CKMBINDEX, TROPONINI in the last 168 hours. BNP (last 3 results) No results for input(s): PROBNP in the last 8760 hours. HbA1C: No results for input(s): HGBA1C in the last 72 hours. CBG:  Recent Labs Lab 07/03/16 1220 07/03/16 1803 07/03/16 2055 07/04/16 0756 07/04/16 1307  GLUCAP 117* 172* 147* 72 101*   Lipid Profile: No results for input(s): CHOL, HDL, LDLCALC, TRIG, CHOLHDL, LDLDIRECT in the last 72 hours. Thyroid Function Tests: No  results for input(s): TSH, T4TOTAL, FREET4, T3FREE, THYROIDAB in the last 72 hours. Anemia Panel: No results for input(s): VITAMINB12, FOLATE, FERRITIN, TIBC, IRON, RETICCTPCT in the last 72 hours. Urine analysis:    Component Value Date/Time   COLORURINE RED (A) 07/02/2016 2127   APPEARANCEUR CLOUDY (A) 07/02/2016 2127   LABSPEC 1.010 07/02/2016 2127   PHURINE >9.0 (H) 07/02/2016 2127   GLUCOSEU 100 (A) 07/02/2016 2127   HGBUR LARGE (A) 07/02/2016 2127   BILIRUBINUR SMALL (A) 07/02/2016 2127   KETONESUR NEGATIVE 07/02/2016 2127   PROTEINUR 100 (A) 07/02/2016 2127   UROBILINOGEN 0.2 08/30/2014 2203   NITRITE POSITIVE (A) 07/02/2016 2127   LEUKOCYTESUR LARGE (A) 07/02/2016 2127     Kordae Buonocore M.D. [facility] 07/04/2016, 2:05 PM  Pager: [phone] Between 7am to 7pm - call Pager - [phone]  After 7pm go to [email or web address] - password [email or web address]  Call night coverage person covering after 7pm

## 2016-07-04 NOTE — Procedures (Signed)
Patient was seen on dialysis and the procedure was supervised.  BFR 400  Via PC BP is  127/60 .   Patient appears to be tolerating treatment well  Lance [name] A 07/04/2016

## 2016-07-04 NOTE — Progress Notes (Signed)
Subjective:  No cos, for HD today/ noted GI  Plans  Conservative approach " no invasive GI procedures with HGB  Above baseline and no visible bleeding " Seen on HD this AM- hgb fairly stable  Objective Vital signs in last 24 hours: Vitals:   07/03/16 1228 07/03/16 1700 07/03/16 2057 07/04/16 0505  BP: 129/63 (!) 151/60 133/64 (!) 159/61  Pulse: 68 66 71 84  Resp: 18 18 18 16   Temp: 97.7 F (36.5 C) 97.4 F (36.3 C) 98.3 F (36.8 C) 98.7 F (37.1 C)  TempSrc: Oral Oral Oral Oral  SpO2: 97% 98% 98% 97%  Weight: 71.2 kg (157 lb)  73.9 kg (163 lb)   Height:       Weight change: -2.722 kg (-6 lb)  Physical Exam: General: alert thin chronically ill WM NAD /at baseline MS OX4 Heart: RRR, 1/6 sem, rub or gallop Lungs: CTA . non labored breathing  Abdomen: bs pos. Soft, NT, ND Extremities: nop pedal edema / bilat feet wrapped in large boot bandaged not unwrapped   Dialysis Access: RFA AVF  POs bruit  (not mature to use/ OV 06/26/16   )  R IJ perm cath Nontender, No dc   Dialysis Orders: [facility] . EDW 73.5 kg HD Bath 2k, 2ca  Time 3.5 hrs Heparin 2.300. Access RFA AVF ( Dr. Bridgett Larsson 03/24/16 inserted,  on OV 06/26/16 =if fistula is not fully mature next time will proceed with BAM vs side branch ligation. Merwyn Katos cath     Mircera 225 mcg q 2 weeks last 06/24/16    Problem/Plan: 1. Anemia=(multiple etiologies ) ESRD Anemia, Infection related /  Elba Barman per admit team / Vincenza Hews  GI = Dr. Michail Sermon saw=" heme negative. Would manage conservatively and would NOT recommend any invasive GI procedures. Hgb is above baseline and no visible bleeding."/ hgb 3.7 > 3 u prbcs > 9.1 >9.5>9.2  And pending today on HD/ on Max ESA at [facility]  center q 2wks with  Next dose 07/08/16 / check Transfer sat with  Pre hd labs, no iv iron at op center/ on po fe at [facility] can dc po and cover with IV fe at kid center   2. ESRD -  HD MWF  Schedule / did not run yesterday sec to busy hd sched.  / k 3.1 on admit k replacement>3.8   ( use 4k bath on hd today ) fu k  may need change op kid center  K bath to 3.0.  Hypertension/volume  -  bp stable  159/61 this am  / needs uf on HD (3 kg above EDW) / per bed wt this am at edw  / On Coreg 3.125mg  bid and Lasix  20mg  bid ( can dc lasix sec to HD/ ESRD pt) 3. Metabolic bone disease -  Velphoro as Phos binder when eating / no op vit on hd  4.  R ischial pressure ulcer with osteomyelitis per Dr. Dellia Nims eval last week he notified Amalia Hailey PA-C 3/02=" Need for- 4weeks Vanco on hd "- consult Pharm for vanco  5. Multiple sclerosis with paraplegia\ 6. Neurogenic bladder with chronic indwelling Foley   Ernest Haber, PA-C [place] [phone] 07/04/2016,8:36 AM  LOS: 2 days    Patient seen and examined, agree with above note with above modifications.  Seen on HD looks good- says wants to go home- hgb pretty stable.  HD done today off schedule due to busy HD schedule yesterday -  I would say if hgb stable tomorrow he could go home- likely need to check hgb maybe 2 times a week at [facility] Corliss Parish, MD 07/04/2016     Labs: Basic Metabolic Panel:  Recent Labs Lab 07/02/16 2107 07/03/16 0910  NA 138 138  K 3.3* 3.8  CL 96* 101  CO2 32 28  GLUCOSE 175* 164*  BUN 36* 40*  CREATININE 3.19* 3.39*  CALCIUM 9.0 8.9   Liver Function Tests:  Recent Labs Lab 07/02/16 2107  AST 18  ALT 15*  ALKPHOS 60  BILITOT 0.3  PROT 7.5  ALBUMIN 2.1*   No results for input(s): LIPASE, AMYLASE in the last 168 hours. No results for input(s): AMMONIA in the last 168 hours. CBC:  Recent Labs Lab 07/02/16 2107 07/03/16 0910 07/03/16 1448 07/04/16 0908  WBC 9.2 7.3  --  9.2  NEUTROABS 4.7  --   --   --   HGB 3.7* 9.1* 9.5* 9.2*  HCT 12.2* 27.6* 29.5* 28.7*  MCV 97.6 92.6  --  93.2  PLT 151 127*  --  160   Cardiac Enzymes: No results for input(s): CKTOTAL, CKMB, CKMBINDEX, TROPONINI in the last 168 hours. CBG:  Recent Labs Lab  07/03/16 0839 07/03/16 1220 07/03/16 1803 07/03/16 2055 07/04/16 0756  GLUCAP 127* 117* 172* 147* 72    Studies/Results: No results found. Medications:  . carvedilol  3.125 mg Oral BID WC  . famotidine  20 mg Oral BID  . feeding supplement  1 Container Oral BID BM  . feeding supplement (PRO-STAT SUGAR FREE 64)  30 mL Oral BID  . ferrous sulfate  325 mg Oral BID WC  . insulin aspart  0-5 Units Subcutaneous QHS  . insulin aspart  0-9 Units Subcutaneous TID WC  . insulin glargine  12 Units Subcutaneous Daily  . Melatonin  3 mg Oral QHS  . oxybutynin  5 mg Oral BID  . sertraline  50 mg Oral QHS  . vancomycin  750 mg Intravenous Q T,Th,Sa-HD  . [START ON 07/06/2016] vancomycin  750 mg Intravenous Q M,W,F-HD

## 2016-07-05 DIAGNOSIS — M86651 Other chronic osteomyelitis, right thigh: Secondary | ICD-10-CM | POA: Diagnosis not present

## 2016-07-05 DIAGNOSIS — K5901 Slow transit constipation: Secondary | ICD-10-CM | POA: Diagnosis not present

## 2016-07-05 DIAGNOSIS — D631 Anemia in chronic kidney disease: Secondary | ICD-10-CM | POA: Diagnosis not present

## 2016-07-05 DIAGNOSIS — M4628 Osteomyelitis of vertebra, sacral and sacrococcygeal region: Secondary | ICD-10-CM | POA: Diagnosis not present

## 2016-07-05 DIAGNOSIS — L89220 Pressure ulcer of left hip, unstageable: Secondary | ICD-10-CM | POA: Diagnosis not present

## 2016-07-05 DIAGNOSIS — G35 Multiple sclerosis: Secondary | ICD-10-CM | POA: Diagnosis not present

## 2016-07-05 DIAGNOSIS — L89310 Pressure ulcer of right buttock, unstageable: Secondary | ICD-10-CM | POA: Diagnosis not present

## 2016-07-05 DIAGNOSIS — F329 Major depressive disorder, single episode, unspecified: Secondary | ICD-10-CM | POA: Diagnosis not present

## 2016-07-05 DIAGNOSIS — N319 Neuromuscular dysfunction of bladder, unspecified: Secondary | ICD-10-CM | POA: Diagnosis not present

## 2016-07-05 DIAGNOSIS — L89214 Pressure ulcer of right hip, stage 4: Secondary | ICD-10-CM | POA: Diagnosis not present

## 2016-07-05 DIAGNOSIS — Z7409 Other reduced mobility: Secondary | ICD-10-CM | POA: Diagnosis not present

## 2016-07-05 DIAGNOSIS — L89314 Pressure ulcer of right buttock, stage 4: Secondary | ICD-10-CM | POA: Diagnosis not present

## 2016-07-05 DIAGNOSIS — D500 Iron deficiency anemia secondary to blood loss (chronic): Secondary | ICD-10-CM | POA: Diagnosis not present

## 2016-07-05 DIAGNOSIS — K219 Gastro-esophageal reflux disease without esophagitis: Secondary | ICD-10-CM | POA: Diagnosis not present

## 2016-07-05 DIAGNOSIS — I132 Hypertensive heart and chronic kidney disease with heart failure and with stage 5 chronic kidney disease, or end stage renal disease: Secondary | ICD-10-CM | POA: Diagnosis not present

## 2016-07-05 DIAGNOSIS — D649 Anemia, unspecified: Secondary | ICD-10-CM | POA: Diagnosis not present

## 2016-07-05 DIAGNOSIS — N2581 Secondary hyperparathyroidism of renal origin: Secondary | ICD-10-CM | POA: Diagnosis not present

## 2016-07-05 DIAGNOSIS — N390 Urinary tract infection, site not specified: Secondary | ICD-10-CM | POA: Diagnosis not present

## 2016-07-05 DIAGNOSIS — E1129 Type 2 diabetes mellitus with other diabetic kidney complication: Secondary | ICD-10-CM | POA: Diagnosis not present

## 2016-07-05 DIAGNOSIS — M869 Osteomyelitis, unspecified: Secondary | ICD-10-CM | POA: Diagnosis not present

## 2016-07-05 DIAGNOSIS — Z992 Dependence on renal dialysis: Secondary | ICD-10-CM | POA: Diagnosis not present

## 2016-07-05 DIAGNOSIS — F319 Bipolar disorder, unspecified: Secondary | ICD-10-CM | POA: Diagnosis not present

## 2016-07-05 DIAGNOSIS — E43 Unspecified severe protein-calorie malnutrition: Secondary | ICD-10-CM | POA: Diagnosis not present

## 2016-07-05 DIAGNOSIS — N186 End stage renal disease: Secondary | ICD-10-CM | POA: Diagnosis not present

## 2016-07-05 DIAGNOSIS — E1122 Type 2 diabetes mellitus with diabetic chronic kidney disease: Secondary | ICD-10-CM | POA: Diagnosis not present

## 2016-07-05 DIAGNOSIS — Z794 Long term (current) use of insulin: Secondary | ICD-10-CM | POA: Diagnosis not present

## 2016-07-05 DIAGNOSIS — L89324 Pressure ulcer of left buttock, stage 4: Secondary | ICD-10-CM | POA: Diagnosis not present

## 2016-07-05 DIAGNOSIS — I120 Hypertensive chronic kidney disease with stage 5 chronic kidney disease or end stage renal disease: Secondary | ICD-10-CM | POA: Diagnosis not present

## 2016-07-05 DIAGNOSIS — I509 Heart failure, unspecified: Secondary | ICD-10-CM | POA: Diagnosis not present

## 2016-07-05 DIAGNOSIS — F3289 Other specified depressive episodes: Secondary | ICD-10-CM | POA: Diagnosis not present

## 2016-07-05 DIAGNOSIS — R531 Weakness: Secondary | ICD-10-CM | POA: Diagnosis not present

## 2016-07-05 DIAGNOSIS — G8220 Paraplegia, unspecified: Secondary | ICD-10-CM | POA: Diagnosis not present

## 2016-07-05 DIAGNOSIS — E440 Moderate protein-calorie malnutrition: Secondary | ICD-10-CM | POA: Diagnosis not present

## 2016-07-05 DIAGNOSIS — I10 Essential (primary) hypertension: Secondary | ICD-10-CM | POA: Diagnosis not present

## 2016-07-05 DIAGNOSIS — Z466 Encounter for fitting and adjustment of urinary device: Secondary | ICD-10-CM | POA: Diagnosis not present

## 2016-07-05 LAB — CBC
HEMATOCRIT: 29.7 % — AB (ref 39.0–52.0)
HEMOGLOBIN: 9.3 g/dL — AB (ref 13.0–17.0)
MCH: 29.7 pg (ref 26.0–34.0)
MCHC: 31.3 g/dL (ref 30.0–36.0)
MCV: 94.9 fL (ref 78.0–100.0)
Platelets: 170 10*3/uL (ref 150–400)
RBC: 3.13 MIL/uL — AB (ref 4.22–5.81)
RDW: 16.1 % — ABNORMAL HIGH (ref 11.5–15.5)
WBC: 8.7 10*3/uL (ref 4.0–10.5)

## 2016-07-05 LAB — BASIC METABOLIC PANEL
Anion gap: 9 (ref 5–15)
BUN: 25 mg/dL — AB (ref 6–20)
CO2: 29 mmol/L (ref 22–32)
CREATININE: 2.71 mg/dL — AB (ref 0.61–1.24)
Calcium: 9 mg/dL (ref 8.9–10.3)
Chloride: 98 mmol/L — ABNORMAL LOW (ref 101–111)
GFR calc Af Amer: 25 mL/min — ABNORMAL LOW (ref >60)
GFR, EST NON AFRICAN AMERICAN: 21 mL/min — AB (ref >60)
Glucose, Bld: 113 mg/dL — ABNORMAL HIGH (ref 65–99)
POTASSIUM: 3.6 mmol/L (ref 3.5–5.1)
SODIUM: 136 mmol/L (ref 135–145)

## 2016-07-05 LAB — GLUCOSE, CAPILLARY
GLUCOSE-CAPILLARY: 103 mg/dL — AB (ref 65–99)
Glucose-Capillary: 201 mg/dL — ABNORMAL HIGH (ref 65–99)

## 2016-07-05 LAB — OCCULT BLOOD X 1 CARD TO LAB, STOOL: Fecal Occult Bld: POSITIVE — AB

## 2016-07-05 MED ORDER — RA SALINE ENEMA 19-7 GM/118ML RE ENEM: 1.0000 "application " | ENEMA | RECTAL | Status: DC | PRN

## 2016-07-05 MED ORDER — INSULIN ASPART 100 UNIT/ML ~~LOC~~ SOLN: 0.0000 [IU] | Freq: Three times a day (TID) | SUBCUTANEOUS | 11 refills | Status: DC

## 2016-07-05 MED ORDER — TRAMADOL HCL 50 MG PO TABS: 50.0000 mg | ORAL_TABLET | Freq: Four times a day (QID) | ORAL | 0 refills | Status: DC | PRN

## 2016-07-05 MED ORDER — VANCOMYCIN HCL IN DEXTROSE 750-5 MG/150ML-% IV SOLN: 750.0000 mg | INTRAVENOUS | Status: DC

## 2016-07-05 MED ORDER — PANTOPRAZOLE SODIUM 40 MG PO TBEC
40.0000 mg | DELAYED_RELEASE_TABLET | Freq: Every day | ORAL | Status: DC
  Administered 2016-07-05: 40 mg via ORAL
  Filled 2016-07-05: qty 1

## 2016-07-05 MED ORDER — PANTOPRAZOLE SODIUM 40 MG PO TBEC: 40.0000 mg | DELAYED_RELEASE_TABLET | Freq: Every day | ORAL | Status: DC

## 2016-07-05 MED ORDER — COLLAGENASE 250 UNIT/GM EX OINT: TOPICAL_OINTMENT | Freq: Every day | CUTANEOUS | 0 refills | Status: DC

## 2016-07-05 NOTE — Clinical Social Work Placement (Signed)
   CLINICAL SOCIAL WORK PLACEMENT  NOTE  Date:  07/05/2016  Patient Details  Name: Lance [name] MRN: [id number] Date of Birth: [birth year]  Clinical Social Work is seeking post-discharge placement for this patient at the [place] level of care (*CSW will initial, date and re-position this form in  chart as items are completed):  Yes   Patient/family provided with [place] Work Department's list of facilities offering this level of care within the geographic area requested by the patient (or if unable, by the patient's family).  Yes   Patient/family informed of their freedom to choose among providers that offer the needed level of care, that participate in Medicare, Medicaid or managed care program needed by the patient, have an available bed and are willing to accept the patient.  Yes   Patient/family informed of [facility]'s ownership interest in [facility] and [facility], as well as of the fact that they are under no obligation to receive care at these facilities.  PASRR submitted to EDS on       PASRR number received on       Existing PASRR number confirmed on 07/05/16     FL2 transmitted to all facilities in geographic area requested by pt/family on 07/05/16     FL2 transmitted to all facilities within larger geographic area on       Patient informed that his/her managed care company has contracts with or will negotiate with certain facilities, including the following:        Yes   Patient/family informed of bed offers received.  Patient chooses bed at  [facility])     Physician recommends and patient chooses bed at      Patient to be transferred to  [facility]) on  .  Patient to be transferred to facility by  [facility])     Patient family notified on 07/05/16 of transfer.  Name of family member notified:  Spouse     PHYSICIAN Please sign FL2     Additional Comment:     _______________________________________________ Serafina Mitchell, LCSWA 07/05/2016, 11:22 AM

## 2016-07-05 NOTE — Clinical Social Work Note (Signed)
Clinical Social Worker facilitated patient discharge including contacting patient family and facility to confirm patient discharge plans.  Clinical information faxed to facility and family agreeable with plan.  CSW arranged ambulance transport via [facility] to [place]. RN to call report [phone] prior to discharge.  Clinical Social Worker will sign off for now as social work intervention is no longer needed. Please consult [place] again if new need arises.  Fleda Pagel B. Joline Maxcy Clinical Social Work Dept Weekend Social Worker [phone] 11:15 AM

## 2016-07-05 NOTE — Progress Notes (Signed)
Report called to [place] at [facility].  All questions answered. Copy of AVS given to patient.

## 2016-07-05 NOTE — Discharge Summary (Addendum)
Physician Discharge Summary   Patient ID: Lance [name] MRN: [id number] [name]/AGE: 76/76/1942 76 y.o.  Admit date: 07/02/2016 Discharge date: 07/05/2016  Primary Care Physician:  Lance Cobble, MD  Discharge Diagnoses:    . Symptomatic anemia . Spastic quadriplegia (Lance [name]) . Sacral decubitus ulcer . Osteomyelitis (Lance [name]) . Multiple sclerosis (Lance [name]) . Essential hypertension, benign . DM (diabetes mellitus) type II controlled with renal manifestation ([place]) . Depression   Consults: Nephrology Gastroenterology  Recommendations for Outpatient Follow-up:  1. Please check CBC tomorrow 07/06/16 and at least 2 times a week for a little while 2. Vancomycin to be continued with hemodialysis at least for 1 month and then further duration to be determined by Dr. Dellia [name]   DIET: Carb modified diet  Allergies:  No Known Allergies   DISCHARGE MEDICATIONS: Current Discharge Medication List    START taking these medications   Details  collagenase (SANTYL) ointment Apply topically daily. APPLY TO LEFT ISCHIAL WOUND DAILY Qty: 15 g, Refills: 0    insulin aspart (NOVOLOG) 100 UNIT/ML injection Inject 0-9 Units into the skin 3 (three) times daily with meals. Sliding scale CBG 70 - 120: 0 units CBG 121 - 150: 1 unit,  CBG 151 - 200: 2 units,  CBG 201 - 250: 3 units,  CBG 251 - 300: 5 units,  CBG 301 - 350: 7 units,  CBG 351 - 400: 9 units   CBG > 400: 9 units and notify your MD Qty: 10 mL, Refills: 11    pantoprazole (PROTONIX) 40 MG tablet Take 1 tablet (40 mg total) by mouth daily before breakfast.    Vancomycin (VANCOCIN) 750-5 MG/150ML-% SOLN Inject 150 mLs (750 mg total) into the vein every Monday, Wednesday, and Friday with hemodialysis. X 4 weeks, further continuation to be determined by Dr Lance [name]: 4000 mL      CONTINUE these medications which have CHANGED   Details  Sodium Phosphates (RA SALINE ENEMA) 19-7 GM/118ML ENEM Place 1 application rectally as needed.    traMADol  (ULTRAM) 50 MG tablet Take 1 tablet (50 mg total) by mouth every 6 (six) hours as needed for severe pain. Qty: 20 tablet, Refills: 0      CONTINUE these medications which have NOT CHANGED   Details  acetaminophen (TYLENOL) 325 MG tablet Take 650 mg by mouth every 6 (six) hours as needed for moderate pain or fever.     Amino Acids-Protein Hydrolys (FEEDING SUPPLEMENT, PRO-STAT SUGAR FREE 64,) LIQD Take 38ml by mouth twice daily for wound healing    b complex-vitamin c-folic acid (NEPHRO-VITE) 0.8 MG TABS tablet Take 1 tablet by mouth every morning.     baclofen (LIORESAL) 10 MG tablet Take 10 mg by mouth at bedtime as needed for muscle spasms.    bisacodyl (DULCOLAX) 10 MG suppository Place 10 mg rectally as needed for moderate constipation.    carvedilol (COREG) 3.125 MG tablet Take 3.125 mg by mouth 2 (two) times daily with a meal.    ferrous sulfate 325 (65 FE) MG tablet Take 325 mg by mouth 2 (two) times daily with a meal.    insulin glargine (LANTUS) 100 UNIT/ML injection Inject 12 Units into the skin every morning.     magnesium hydroxide (MILK OF MAGNESIA) 400 MG/5ML suspension Take 30 mLs by mouth daily as needed for mild constipation.    Melatonin 3 MG TABS Take 3 mg by mouth at bedtime.    Nutritional Supplements (NEPRO) LIQD Take 237 mLs by mouth 2 (two)  times daily. One can by mouth twice daily for supplement/wound healing     oxybutynin (DITROPAN) 5 MG tablet Take 5 mg by mouth 2 (two) times daily.     polyethylene glycol (MIRALAX / GLYCOLAX) packet Take 17 g by mouth daily as needed for mild constipation. Qty: 14 each, Refills: 0    promethazine (PHENERGAN) 25 MG tablet Take 25 mg by mouth every 6 (six) hours as needed for nausea or vomiting.    sertraline (ZOLOFT) 50 MG tablet Take 50 mg by mouth at bedtime.  Qty: 30 tablet, Refills: 3   Associated Diagnoses: Reactive depression    Skin Protectants, Misc. (EUCERIN) cream Apply 1 application topically daily. For  feet    sucroferric oxyhydroxide (VELPHORO) 500 MG chewable tablet Chew 1,000 mg by mouth 3 (three) times daily with meals.      STOP taking these medications     aspirin 81 MG tablet      famotidine (PEPCID) 20 MG tablet      furosemide (LASIX) 20 MG tablet      enoxaparin (LOVENOX) 30 MG/0.3ML injection          Brief H and P: For complete details please refer to admission H and P, but in brief Lance Ohm Rockeleinis a 76 y.o.malewith medical history significant ofESRD on HD(M/W/F), DM type 2, multiple sclerosis withparaplegia, chronic anemia, decubitus ulcer, CHF, and neurogenic bladder with chronic catheter; who presents with complaints of low hemoglobin count. Patient reports that his hemoglobin last week was 7.7 g/dL and normally is between 7-9 g/dL. He had been staying at [facility] for cure of a left gluteal decubitus ulcer. There is a wound VAC over the ulcer but patient notes that there has been very minimal drainage. Over the last week his hemoglobin was noted to drop when checked at SNF 2 days ago his hemoglobin was 6.5 and then today noted to be 5.5. He had been on Lovenox injections daily for DVT prophylaxis, but this had been stopped after initial drop. Associated symptoms include generalized fatigue, constipation, dark stools(on iron supplementation), and occasional nausea/nonbloody emesis with dialysis. Denies any significant chest pain, shortness of breath, fever, or chills. He has required transfusions in the past and notes that his last transfusion was some time in 04/2016, but his hemoglobin has never dropped as low. Patient reports that his last colonoscopy was likely more than 10 years ago.   Hospital Course:   Acute on chronic Symptomatic anemia likely due to bleeding from the left hip wound -  hemoglobin 3.7 on admission. Stool guaiac was negative on admission. Patient also reported that 3 days ago prior to admission he had significant bleeding from the left hip  wound. Nursing applied pressure for 15 minutes to control bleeding. Lovenox was discontinued at the facility prior to admission which he had been receiving for DVT prophylaxis. Patient himself did not report any hematochezia or melena. - Patient received 3 units packed RBCs.  Both sacral wounds were inspected, no bleeding currently - GI was consulted, patient was seen by Dr. Michail Sermon and recommended no invasive GI procedures.  - On the day of discharge, patient had a large bowel movement, FOBT was positive, however hemoglobin remained stable 9.3. I spoke with Dr. Stacie Glaze gastroenterology on call who recommended no invasive GI procedures and conservative management. Per GI, recommended to check CBC tomorrow at the skilled nursing facility and follow CBC at least 2 times a week for closer monitoring. Outpatient follow-up by GI if needed.  ESRD on HD(M/W/F) - Nephrology consulted,  patient underwent hemodialysis per schedule.  Left decubitus woundand chronic Osteomyelitis: Patient reports that Dr. Dellia [name] had wanted him to started on vancomycin during dialysis treatments from recent bone biopsy that showed light growth of MRSA. See nursing home note from 06/30/2016. - MRI in 10/17 had shown large ulceration on the right issue tuberosity extending to the bone compatible with ostium mellitus, patient has a wound VAC - Low air loss mattress,  Wound care consulted  - Patient was started on vancomycin with hemodialysis, continue for 1 month and further duration to be determined by Dr. Dellia [name].  Multiple sclerosis with paraplegia: -  At baseline patient reports being bedbound,and is intermittently transported in a wheelchair. - Continue baclofen, oxybutynin  Neurogenic bladder with chronic indwelling Foley :Patient with previous history of recurrent UTIs. -  Urine culture with colonization, multiples species. No fevers or leukocytosis.  Diabetes mellitus type 2 - Continue home Lantus dose ,  added sensitive sliding scale insulin  Depression - Continue Zoloft  Essential hypertension - Continue home Coreg  Gerd - Pepcid was discontinued and patient was placed on PPI    Malnutrition of moderate degree - Patient registered dietitian was consulted and patient was placed on feeding supplement, pro-stat    Day of Discharge BP 137/61 (BP Location: Left Arm)   Pulse 84   Temp 97.7 F (36.5 C) (Oral)   Resp 16   Ht 6\' 4"  (1.93 m)   Wt 74.4 kg (164 lb)   SpO2 96%   BMI 19.96 kg/m   Physical Exam: General: Alert and awake oriented x3 not in any acute distress. HEENT: anicteric sclera, pupils reactive to light and accommodation CVS: S1-S2 clear no murmur rubs or gallops Chest: clear to auscultation bilaterally, no wheezing rales or rhonchi Abdomen: soft nontender, nondistended, normal bowel sounds Extremities: no cyanosis, clubbing or edema noted bilaterally Neuro: paraplegia Skin: Stage IV pressure injury to the right ischium with wound VAC, left ischium 1 x 1.5cm   The results of significant diagnostics from this hospitalization (including imaging, microbiology, ancillary and laboratory) are listed below for reference.    LAB RESULTS: Basic Metabolic Panel:  Recent Labs Lab 07/04/16 0908 07/05/16 0703  NA 139  139 136  K 3.6  3.6 3.6  CL 101  102 98*  CO2 29  30 29   GLUCOSE 84  84 113*  BUN 54*  54* 25*  CREATININE 4.04*  4.03* 2.71*  CALCIUM 9.1  9.1 9.0  PHOS 2.5  --    Liver Function Tests:  Recent Labs Lab 07/02/16 2107 07/04/16 0908  AST 18  --   ALT 15*  --   ALKPHOS 60  --   BILITOT 0.3  --   PROT 7.5  --   ALBUMIN 2.1* 2.1*   No results for input(s): LIPASE, AMYLASE in the last 168 hours. No results for input(s): AMMONIA in the last 168 hours. CBC:  Recent Labs Lab 07/02/16 2107  07/04/16 0908 07/05/16 0703  WBC 9.2  < > 9.2 8.7  NEUTROABS 4.7  --   --   --   HGB 3.7*  < > 9.2* 9.3*  HCT 12.2*  < > 28.7* 29.7*   MCV 97.6  < > 93.2 94.9  PLT 151  < > 160 170  < > = values in this interval not displayed. Cardiac Enzymes: No results for input(s): CKTOTAL, CKMB, CKMBINDEX, TROPONINI in the last 168 hours. BNP: Invalid input(s): POCBNP CBG:  Recent Labs Lab 07/04/16 2059 07/05/16 0816  GLUCAP 212* 103*    Significant Diagnostic Studies:  No results found.  2D ECHO:   Disposition and Follow-up: Discharge Instructions    Diet Carb Modified    Complete by:  As directed    Increase activity slowly    Complete by:  As directed        DISPOSITION: SNF   [place]    Lance Cobble, MD. Schedule an appointment as soon as possible for a visit in 1 week(s).   Specialty:  Internal Medicine Contact information: [place] [address] [phone]            Time spent on Discharge: 35 mins   Signed:   RAI,RIPUDEEP M.D. [facility] 07/05/2016, 9:57 AM Pager: [phone]  Coding query ADDENDUM  History of chronic systolic and diastolic CHF:  Echo 78/9784 EF 78-41%, grade 1 diastolic dysfunction    RAI,RIPUDEEP M.D. [facility] 07/13/2016, 10:11 AM  Pager: [phone]

## 2016-07-05 NOTE — Progress Notes (Signed)
Subjective:  No cos / tolerated HD yest. We think is being discharged today   Objective Vital signs in last 24 hours: Vitals:   07/04/16 1234 07/04/16 1700 07/04/16 2226 07/05/16 0505  BP: 120/62 126/65 (!) 133/46 137/61  Pulse: 89 83 88 84  Resp: 20  18 16   Temp: 98.2 F (36.8 C) 98.5 F (36.9 C) 99.2 F (37.3 C) 97.7 F (36.5 C)  TempSrc: Oral Oral Oral Oral  SpO2: 98%  97% 96%  Weight: 74.9 kg (165 lb 2 oz)  74.4 kg (164 lb)   Height:       Weight change: 5.485 kg (12 lb 1.5 oz)  Physical Exam: General: alert thin chronically ill WM NAD  Heart: RRR, 1/6 sem, rub or gallop Lungs: CTA . non labored breathing  Abdomen: bs pos. Soft, NT, ND Extremities: no pedal edema / bilat feet wrapped in large boot bandaged not unwrapped  Dialysis Access: RFA AVF POs bruit (not mature to use/ OV 06/26/16 ) R IJ perm cath Nontender, No dc   [facility]: [facility]. EDW 73.5 kgHD Bath 2k, 2caTime 3.5 hrsHeparin 2.300. Access RFA AVF ( Dr. Bridgett Larsson 03/24/16 inserted, on OV 06/26/16 =if fistula is not fully mature next time will proceed with BAM vs side branch ligation.Merwyn Katos cath  Mircera 225 mcg q 2 weeks last 06/24/16   Problem/Plan: 1. Anemia=(multiple etiologies ) ESRD Anemia, Infection related /  Am HGB 9.3 stable / GI = Dr. Michail Sermon saw=" heme negative. Would manage conservatively and would NOT recommend any invasive GI procedures. Hgb is stable and no visible bleeding."/admit  hgb 3.7 > 3 u prbcs  on Max ESA at [facility]  center q 2wks with  Next dose 07/08/16 /  Transfer sat. pending  Pre hd labs yest.., no iv iron at op center/ on po fe at [facility] can dc po and cover with IV fe at  At kid. center /notify op unit check hgb 2 times a week for a while 2. ESRD - HD MWF  Off Schedule / hd yesterday  /k 3.6 yest.  ( used 4k bath on hd  )  change op kid center  K bath to 3.0 / Next HD tomor / will notify op unit changes  3. Hypertension/volume - bp stable  137/61 this  am  / 1.0 uf on HD yest.,0.9 kg above EDW post wt / per bed wt   / On Coreg 3.125mg  bid 4. Metabolic bone disease - Velphoro as Phos binder / no op vit on hd  5. R ischial pressure ulcer with osteomyelitis per Dr. Dellia Nims eval last week he notified Amalia Hailey PA-C 3/02=" Need for- 4weeks Vanco on hd "- consult Pharm for vanco  6. Multiple sclerosis with paraplegia  Ernest Haber, PA-C [place] [phone] 07/05/2016,8:27 AM  LOS: 3 days    Patient seen and examined, agree with above note with above modifications. Brief hosp for extreme anemia supposedly 3.7- however, after 3 units PRBC hgb has been stable in the 9's.  No aggressive invasive work up- for [employer] today .  Cont max ESA as OP plus iron and check hgb 2 times a week for a little while  Corliss Parish, MD 07/05/2016     Labs: Basic Metabolic Panel:  Recent Labs Lab 07/02/16 2107 07/03/16 0910 07/04/16 0908  NA 138 138 139  139  K 3.3* 3.8 3.6  3.6  CL 96* 101 101  102  CO2 32 28  29  30  GLUCOSE 175* 164* 84  84  BUN 36* 40* 54*  54*  CREATININE 3.19* 3.39* 4.04*  4.03*  CALCIUM 9.0 8.9 9.1  9.1  PHOS  --   --  2.5   Liver Function Tests:  Recent Labs Lab 07/02/16 2107 07/04/16 0908  AST 18  --   ALT 15*  --   ALKPHOS 60  --   BILITOT 0.3  --   PROT 7.5  --   ALBUMIN 2.1* 2.1*   No results for input(s): LIPASE, AMYLASE in the last 168 hours. No results for input(s): AMMONIA in the last 168 hours. CBC:  Recent Labs Lab 07/02/16 2107 07/03/16 0910 07/03/16 1448 07/04/16 0908 07/05/16 0703  WBC 9.2 7.3  --  9.2 8.7  NEUTROABS 4.7  --   --   --   --   HGB 3.7* 9.1* 9.5* 9.2* 9.3*  HCT 12.2* 27.6* 29.5* 28.7* 29.7*  MCV 97.6 92.6  --  93.2 94.9  PLT 151 127*  --  160 170   Cardiac Enzymes: No results for input(s): CKTOTAL, CKMB, CKMBINDEX, TROPONINI in the last 168 hours. CBG:  Recent Labs Lab 07/04/16 0756 07/04/16 1307 07/04/16 1645 07/04/16 2059  07/05/16 0816  GLUCAP 72 101* 171* 212* 103*    Studies/Results: No results found. Medications:  . carvedilol  3.125 mg Oral BID WC  . collagenase   Topical Daily  . [START ON 07/08/2016] darbepoetin (ARANESP) injection - DIALYSIS  200 mcg Intravenous Q Wed-HD  . feeding supplement (NEPRO CARB STEADY)  237 mL Oral TID WC  . feeding supplement (PRO-STAT SUGAR FREE 64)  30 mL Oral BID  . insulin aspart  0-5 Units Subcutaneous QHS  . insulin aspart  0-9 Units Subcutaneous TID WC  . insulin glargine  12 Units Subcutaneous Daily  . Melatonin  3 mg Oral QHS  . oxybutynin  5 mg Oral BID  . pantoprazole  40 mg Oral Q0600  . sertraline  50 mg Oral QHS  . vancomycin  750 mg Intravenous Q T,Th,Sa-HD  . [START ON 07/06/2016] vancomycin  750 mg Intravenous Q M,W,F-HD

## 2016-07-05 NOTE — NC FL2 (Signed)
[place] MEDICAID FL2 LEVEL OF CARE SCREENING TOOL     IDENTIFICATION  Patient Name: Lance [name] Birthdate: [birth year] Sex: male Admission Date (Current Location): 07/02/2016  [facility] and [place] Number:  Herbalist and Address:  [facility] [place], [place] [address] [place], [place] [address]      Provider Number: [id number]  Attending Physician Name and Address:  Mendel Corning, MD  Relative Name and Phone Number:       Current Level of Care: Hospital Recommended Level of Care: [place] Prior Approval Number:    Date Approved/Denied:   PASRR Number: [id number]  Discharge Plan: SNF    Current Diagnoses: Patient Active Problem List   Diagnosis Date Noted  . Malnutrition of moderate degree 07/03/2016  . Osteomyelitis ([place]) 07/02/2016  . Symptomatic anemia 07/02/2016  . Respiratory failure ([place]) 05/09/2016  . SIRS (systemic inflammatory response syndrome) ([place]) 05/09/2016  . Melena 05/09/2016  . Hematemesis 05/09/2016  . Non-intractable vomiting with nausea 05/07/2016  . Altered mental status 04/21/2016  . Acute respiratory failure ([place])   . ESRD on dialysis ([place])   . NICM (nonischemic cardiomyopathy) ([place])   . Impaired functional mobility, balance, and endurance 02/17/2016  . Generalized anxiety disorder 02/17/2016  . Cardiomyopathy ([place]) 02/17/2016  . Hypoxia   . Acute pulmonary edema (HCC)   . Elevated troponin 02/14/2016  . Severe protein-calorie malnutrition ([place]) 02/14/2016  . Acute systolic heart failure ([place]) 02/14/2016  . Depression 02/13/2016  . DM (diabetes mellitus) type II controlled with renal manifestation ([place]) 09/06/2015  . Dyslipidemia associated with type 2 diabetes mellitus ([place]) 09/06/2015  . Recurrent UTI (urinary tract infection) 09/06/2015  . Decubitus ulcer of right ischial area 06/14/2015  . Decubitus ulcer of trochanter, stage 4 ([place]) 06/14/2015  . Left perineal ischial pressure  ulcer   . Acute osteomyelitis, pelvis, right ([place])   . Essential hypertension, benign 08/03/2013  . Cellulitis of foot 08/02/2013  . Spastic quadriplegia ([place]) 07/10/2011  . Multiple sclerosis ([place]) 03/30/2011  . Sacral decubitus ulcer 03/30/2011  . Progressive anemia 03/30/2011    Orientation RESPIRATION BLADDER Height & Weight     Self, Time, Situation, Place  Normal Continent Weight: 164 lb (74.4 kg) Height:  6\' 4"  (193 cm)  BEHAVIORAL SYMPTOMS/MOOD NEUROLOGICAL BOWEL NUTRITION STATUS      Continent Diet (See DC Summary)  AMBULATORY STATUS COMMUNICATION OF NEEDS Skin   Limited Assist Verbally Normal                       Personal Care Assistance Level of Assistance  Bathing, Dressing Bathing Assistance: Limited assistance   Dressing Assistance: Limited assistance     Functional Limitations Info  Sight, Speech, Hearing Sight Info: Adequate Hearing Info: Adequate Speech Info: Adequate    SPECIAL CARE FACTORS FREQUENCY  PT (By licensed PT), OT (By licensed OT)     PT Frequency: 5x wk OT Frequency: 5x wk            Contractures Contractures Info: Not present    Additional Factors Info  Code Status, Allergies, Isolation Precautions Code Status Info: Full  Allergies Info: No Known Allergies     Isolation Precautions Info: Contact     Current Medications (07/05/2016):  This is the current hospital active medication list Current Facility-Administered Medications  Medication Dose Route Frequency Provider Last Rate Last Dose  . albuterol (PROVENTIL) (2.5 MG/3ML) 0.083% nebulizer solution 2.5 mg  2.5 mg  Nebulization Q2H PRN Norval Morton, MD      . bisacodyl (DULCOLAX) suppository 10 mg  10 mg Rectal Daily PRN Norval Morton, MD      . carvedilol (COREG) tablet 3.125 mg  3.125 mg Oral BID WC Norval Morton, MD   3.125 mg at 07/05/16 0925  . collagenase (SANTYL) ointment   Topical Daily Ripudeep Krystal Eaton, MD      . Derrill Memo ON 07/08/2016] Darbepoetin Alfa  (ARANESP) injection 200 mcg  200 mcg Intravenous Q Wed-HD Ernest Haber, PA-C      . feeding supplement (NEPRO CARB STEADY) liquid 237 mL  237 mL Oral TID WC Ernest Haber, PA-C 0 mL/hr at 07/05/16 0200 237 mL at 07/05/16 0925  . feeding supplement (PRO-STAT SUGAR FREE 64) liquid 30 mL  30 mL Oral BID Norval Morton, MD   30 mL at 07/03/16 2106  . insulin aspart (novoLOG) injection 0-5 Units  0-5 Units Subcutaneous QHS Norval Morton, MD   2 Units at 07/04/16 2329  . insulin aspart (novoLOG) injection 0-9 Units  0-9 Units Subcutaneous TID WC Norval Morton, MD   2 Units at 07/04/16 1809  . insulin glargine (LANTUS) injection 12 Units  12 Units Subcutaneous Daily Norval Morton, MD   12 Units at 07/05/16 1049  . Melatonin TABS 3 mg  3 mg Oral QHS Norval Morton, MD   3 mg at 07/04/16 2328  . oxybutynin (DITROPAN) tablet 5 mg  5 mg Oral BID Norval Morton, MD   5 mg at 07/05/16 0925  . pantoprazole (PROTONIX) EC tablet 40 mg  40 mg Oral Q0600 Ripudeep Krystal Eaton, MD   40 mg at 07/05/16 0925  . promethazine (PHENERGAN) tablet 25 mg  25 mg Oral Q6H PRN Norval Morton, MD      . sertraline (ZOLOFT) tablet 50 mg  50 mg Oral QHS Norval Morton, MD   50 mg at 07/04/16 2328  . sodium phosphate (FLEET) 7-19 GM/118ML enema 1 enema  1 enema Rectal Once PRN Norval Morton, MD      . traMADol Veatrice Bourbon) tablet 50 mg  50 mg Oral Q6H PRN Norval Morton, MD      . vancomycin (VANCOCIN) IVPB 750 mg/150 ml premix  750 mg Intravenous Q T,Th,Sa-HD Leodis Sias, RPH      . [START ON 07/06/2016] vancomycin (VANCOCIN) IVPB 750 mg/150 ml premix  750 mg Intravenous Q M,W,F-HD Leodis Sias, [facility]         Discharge Medications: Please see discharge summary for a list of discharge medications.  Relevant Imaging Results:  Relevant Lab Results:   Additional Information [phone] Tavaras Goody B, LCSWA

## 2016-07-06 ENCOUNTER — Telehealth: Payer: Self-pay

## 2016-07-06 DIAGNOSIS — M4628 Osteomyelitis of vertebra, sacral and sacrococcygeal region: Secondary | ICD-10-CM | POA: Diagnosis not present

## 2016-07-06 DIAGNOSIS — N2581 Secondary hyperparathyroidism of renal origin: Secondary | ICD-10-CM | POA: Diagnosis not present

## 2016-07-06 DIAGNOSIS — D631 Anemia in chronic kidney disease: Secondary | ICD-10-CM | POA: Diagnosis not present

## 2016-07-06 DIAGNOSIS — N186 End stage renal disease: Secondary | ICD-10-CM | POA: Diagnosis not present

## 2016-07-06 LAB — TYPE AND SCREEN
ABO/RH(D): A POS
Antibody Screen: NEGATIVE
Unit division: 0
Unit division: 0
Unit division: 0
Unit division: 0

## 2016-07-06 LAB — BPAM RBC
Blood Product Expiration Date: 201803142359
Blood Product Expiration Date: 201803142359
Blood Product Expiration Date: 201803272359
Blood Product Expiration Date: 201803272359
ISSUE DATE / TIME: 201803012231
ISSUE DATE / TIME: 201803020105
ISSUE DATE / TIME: 201803020337
Unit Type and Rh: 6200
Unit Type and Rh: 6200
Unit Type and Rh: 6200
Unit Type and Rh: 6200

## 2016-07-06 NOTE — Telephone Encounter (Signed)
Possible re-admission to facility. This is a patient you were seeing at [facility] . [facility] F/U is needed if patient was re-admitted to facility upon discharge. Hospital discharge from [facility] on 07/05/16.

## 2016-07-07 ENCOUNTER — Encounter: Payer: Self-pay | Admitting: Internal Medicine

## 2016-07-07 ENCOUNTER — Non-Acute Institutional Stay (SKILLED_NURSING_FACILITY): Payer: Medicare Other | Admitting: Internal Medicine

## 2016-07-07 DIAGNOSIS — D649 Anemia, unspecified: Secondary | ICD-10-CM

## 2016-07-07 DIAGNOSIS — N390 Urinary tract infection, site not specified: Secondary | ICD-10-CM

## 2016-07-07 DIAGNOSIS — M869 Osteomyelitis, unspecified: Secondary | ICD-10-CM | POA: Diagnosis not present

## 2016-07-07 NOTE — Assessment & Plan Note (Signed)
Concept of colonization due to chronic catheterization  discussed with him. [facility] will continue follow-up

## 2016-07-07 NOTE — Progress Notes (Signed)
    Facility Location: [facility]  Room Number: 103   Code Status: Full Code   This is a nursing facility follow up for [facility] readmission within 30 days  Interim medical record and care since last [place] visit was updated with review of diagnostic studies and change in clinical status since last visit were documented.  HPI: The patient was hospitalized 3/1-07/05/16 with symptomatic anemia. He had developed profound anemia in the context of bleeding from a left hip wound while on prophylactic Lovenox. Wound VAC had been initiated for the left gluteal decubitus ulcer area. 48 hours pre admission active bleeding was noted requiring pressure. Hemoglobin was found to be 6.5 prompting referral to the ER for transfusion. In the emergency room he was found to have hemoglobin of 5.5. He had generalized fatigue but denied dyspnea or angina. H&H were 9.3 and 29.7 @ the time of discharge post 3 units pc. Creatinine varied from 2.71-4.04. He continues dialysis. Vancomycin has been initiated for osteomyelitis and will be administered @ dialysis.  Review of systems:  He does describe ongoing fatigue.  Epistaxis, hemoptysis, hematuria, melena, or rectal bleeding denied. Stool is dark, but he is on iron No unexplained weight loss, significant dyspepsia,dysphagia, or abdominal pain.  There is no abnormal bruising , bleeding, or difficulty stopping bleeding with injury.  Constitutional: No fever,significant weight change Cardiovascular: No chest pain, palpitations,paroxysmal nocturnal dyspnea, claudication, edema  Respiratory: No cough, sputum production, snoring,apnea    Physical exam:  Pertinent or positive findings: Some skin pallor is present. Fistula present at the base the right neck. Foley catheter in place. Posterior tibial pulses decreased. Essentially no movement of the lower extremities. The lower extremities are in support booties  General  appearance:Adequately nourished; no acute distress , increased work of breathing is present.   Lymphatic: No lymphadenopathy about the head, neck, axilla . Eyes: No conjunctival inflammation or lid edema is present. There is no scleral icterus. Ears:  External ear exam shows no significant lesions or deformities.   Nose:  External nasal examination shows no deformity or inflammation. Nasal mucosa are pink and moist without lesions ,exudates Oral exam: lips and gums are healthy appearing.There is no oropharyngeal erythema or exudate . Neck:  No thyromegaly, masses, tenderness noted.    Heart:  Normal rate and regular rhythm. S1 and S2 normal without gallop, murmur, click, rub .  Lungs:Chest clear to auscultation without wheezes, rhonchi,rales , rubs. Abdomen:Bowel sounds are normal. Abdomen is soft and nontender with no organomegaly, hernias,masses. GU: deferred  Extremities:  No cyanosis, clubbing,edema  Skin: Warm & dry w/o tenting. No significant rash.  See summary under each active problem in the Problem List with associated updated therapeutic plan

## 2016-07-07 NOTE — Assessment & Plan Note (Signed)
Palliative care consultation discussed with the patient. He has requested literature concerning this.   In the presence of advanced and serious comorbid conditions as are present; this should enhance supportive care at home and prevent complications.   An incredibly sensitive and compassionate discussion of these issues is found in the book Being Mortal by Dr Eda Keys. I recommend  this to him & his family

## 2016-07-07 NOTE — Assessment & Plan Note (Signed)
Clinically stable following transfusion of 3 units of packed cells ;clinically no evidence of active bleeding off Lovenox Discussed risk of Lovenox outweighing potential benefit

## 2016-07-07 NOTE — Patient Instructions (Signed)
See assessment and plan under each diagnosis in the problem list and acutely for this visit Palliative care literature provided to him

## 2016-07-08 DIAGNOSIS — D631 Anemia in chronic kidney disease: Secondary | ICD-10-CM | POA: Diagnosis not present

## 2016-07-08 DIAGNOSIS — N186 End stage renal disease: Secondary | ICD-10-CM | POA: Diagnosis not present

## 2016-07-08 DIAGNOSIS — M4628 Osteomyelitis of vertebra, sacral and sacrococcygeal region: Secondary | ICD-10-CM | POA: Diagnosis not present

## 2016-07-08 DIAGNOSIS — N2581 Secondary hyperparathyroidism of renal origin: Secondary | ICD-10-CM | POA: Diagnosis not present

## 2016-07-09 DIAGNOSIS — L89220 Pressure ulcer of left hip, unstageable: Secondary | ICD-10-CM | POA: Diagnosis not present

## 2016-07-09 DIAGNOSIS — L89214 Pressure ulcer of right hip, stage 4: Secondary | ICD-10-CM | POA: Diagnosis not present

## 2016-07-10 DIAGNOSIS — M4628 Osteomyelitis of vertebra, sacral and sacrococcygeal region: Secondary | ICD-10-CM | POA: Diagnosis not present

## 2016-07-10 DIAGNOSIS — N2581 Secondary hyperparathyroidism of renal origin: Secondary | ICD-10-CM | POA: Diagnosis not present

## 2016-07-10 DIAGNOSIS — D631 Anemia in chronic kidney disease: Secondary | ICD-10-CM | POA: Diagnosis not present

## 2016-07-10 DIAGNOSIS — N186 End stage renal disease: Secondary | ICD-10-CM | POA: Diagnosis not present

## 2016-07-13 ENCOUNTER — Encounter: Payer: Self-pay | Admitting: Vascular Surgery

## 2016-07-13 DIAGNOSIS — M4628 Osteomyelitis of vertebra, sacral and sacrococcygeal region: Secondary | ICD-10-CM | POA: Diagnosis not present

## 2016-07-13 DIAGNOSIS — N186 End stage renal disease: Secondary | ICD-10-CM | POA: Diagnosis not present

## 2016-07-13 DIAGNOSIS — D631 Anemia in chronic kidney disease: Secondary | ICD-10-CM | POA: Diagnosis not present

## 2016-07-13 DIAGNOSIS — N2581 Secondary hyperparathyroidism of renal origin: Secondary | ICD-10-CM | POA: Diagnosis not present

## 2016-07-15 ENCOUNTER — Non-Acute Institutional Stay (SKILLED_NURSING_FACILITY): Payer: Medicare Other | Admitting: Nurse Practitioner

## 2016-07-15 ENCOUNTER — Encounter: Payer: Self-pay | Admitting: Nurse Practitioner

## 2016-07-15 DIAGNOSIS — K5901 Slow transit constipation: Secondary | ICD-10-CM

## 2016-07-15 DIAGNOSIS — E1122 Type 2 diabetes mellitus with diabetic chronic kidney disease: Secondary | ICD-10-CM | POA: Diagnosis not present

## 2016-07-15 DIAGNOSIS — Z7409 Other reduced mobility: Secondary | ICD-10-CM | POA: Diagnosis not present

## 2016-07-15 DIAGNOSIS — N186 End stage renal disease: Secondary | ICD-10-CM | POA: Diagnosis not present

## 2016-07-15 DIAGNOSIS — I10 Essential (primary) hypertension: Secondary | ICD-10-CM

## 2016-07-15 DIAGNOSIS — G35 Multiple sclerosis: Secondary | ICD-10-CM

## 2016-07-15 DIAGNOSIS — M869 Osteomyelitis, unspecified: Secondary | ICD-10-CM | POA: Diagnosis not present

## 2016-07-15 DIAGNOSIS — Z794 Long term (current) use of insulin: Secondary | ICD-10-CM

## 2016-07-15 DIAGNOSIS — Z992 Dependence on renal dialysis: Secondary | ICD-10-CM | POA: Diagnosis not present

## 2016-07-15 DIAGNOSIS — N390 Urinary tract infection, site not specified: Secondary | ICD-10-CM

## 2016-07-15 DIAGNOSIS — E43 Unspecified severe protein-calorie malnutrition: Secondary | ICD-10-CM

## 2016-07-15 DIAGNOSIS — N2581 Secondary hyperparathyroidism of renal origin: Secondary | ICD-10-CM | POA: Diagnosis not present

## 2016-07-15 DIAGNOSIS — L89310 Pressure ulcer of right buttock, unstageable: Secondary | ICD-10-CM | POA: Diagnosis not present

## 2016-07-15 DIAGNOSIS — M4628 Osteomyelitis of vertebra, sacral and sacrococcygeal region: Secondary | ICD-10-CM | POA: Diagnosis not present

## 2016-07-15 DIAGNOSIS — D631 Anemia in chronic kidney disease: Secondary | ICD-10-CM | POA: Diagnosis not present

## 2016-07-15 DIAGNOSIS — F329 Major depressive disorder, single episode, unspecified: Secondary | ICD-10-CM | POA: Diagnosis not present

## 2016-07-15 MED ORDER — INSULIN GLARGINE 100 UNIT/ML ~~LOC~~ SOLN: 12.0000 [IU] | SUBCUTANEOUS | 0 refills | Status: DC

## 2016-07-15 MED ORDER — COLLAGENASE 250 UNIT/GM EX OINT: TOPICAL_OINTMENT | Freq: Every day | CUTANEOUS | 0 refills | Status: DC

## 2016-07-15 MED ORDER — CARVEDILOL 3.125 MG PO TABS: 3.1250 mg | ORAL_TABLET | Freq: Two times a day (BID) | ORAL | 0 refills | Status: DC

## 2016-07-15 MED ORDER — NEPHRO-VITE 0.8 MG PO TABS: 1.0000 | ORAL_TABLET | ORAL | 0 refills | Status: DC

## 2016-07-15 MED ORDER — BACLOFEN 10 MG PO TABS: 10.0000 mg | ORAL_TABLET | Freq: Every evening | ORAL | 0 refills | Status: DC | PRN

## 2016-07-15 MED ORDER — INSULIN ASPART 100 UNIT/ML ~~LOC~~ SOLN: 0.0000 [IU] | Freq: Three times a day (TID) | SUBCUTANEOUS | 0 refills | Status: DC

## 2016-07-15 MED ORDER — SUCROFERRIC OXYHYDROXIDE 500 MG PO CHEW: 1000.0000 mg | CHEWABLE_TABLET | Freq: Three times a day (TID) | ORAL | 0 refills | Status: DC

## 2016-07-15 MED ORDER — SERTRALINE HCL 50 MG PO TABS: 50.0000 mg | ORAL_TABLET | Freq: Every day | ORAL | 0 refills | Status: DC

## 2016-07-15 MED ORDER — LINACLOTIDE 145 MCG PO CAPS: 145.0000 ug | ORAL_CAPSULE | ORAL | 0 refills | Status: DC

## 2016-07-15 MED ORDER — OXYBUTYNIN CHLORIDE 5 MG PO TABS: 5.0000 mg | ORAL_TABLET | Freq: Two times a day (BID) | ORAL | 0 refills | Status: DC

## 2016-07-15 MED ORDER — PANTOPRAZOLE SODIUM 40 MG PO TBEC: 40.0000 mg | DELAYED_RELEASE_TABLET | Freq: Every day | ORAL | Status: DC

## 2016-07-15 NOTE — Progress Notes (Signed)
Nursing Home Location: [facility]  Place of Service: SNF (31)  PCP: [name], MD  No Known Allergies  Chief Complaint  Patient presents with  . Discharge Note    Resident is being discharged from SNF.     HPI:  Patient is a 76 y.o. male seen today at [facility] for discharge .  Pt with hx of paraplegic due to MS, CKD, DM and hyperlipidemia. [facility]/26-12/7/17. He has most recently been hospitalized 3/1-07/05/16 with symptomatic anemia. He had developed profound anemia in the context of bleeding from a left hip wound while on prophylactic Lovenox. Wound VAC had been initiated for the left gluteal decubitus ulcer area. 48 hours pre admission active bleeding was noted requiring pressure. Hemoglobin was found to be 6.5 prompting referral to the ER for transfusion. In the emergency room he was found to have hemoglobin of 5.5. He had generalized fatigue but denied dyspnea or angina. H&H were 9.3 and 29.7 @ the time of discharge post 3 units pc. Creatinine varied from 2.71-4.04. He continues dialysis. Vancomycin has been initiated for osteomyelitis and will be administered @ dialysis.  Pt also following with [facility] for chronic wounds and urology for ongoing UTI. He currently has a catheter due to urinary retention. Pt is requesting discharge to avoid co-pay. He reports his wife and son help him at home. He has transportation set up to get him to appts. Plans to also pay someone to help with his care as well.   Review of Systems:  Review of Systems  Constitutional: Negative for activity change and appetite change.  HENT: Negative for congestion.   Respiratory: Negative for cough and shortness of breath.   Cardiovascular: Negative for chest pain, palpitations and leg swelling.  Gastrointestinal: Negative for abdominal pain and constipation.  Genitourinary:       Chronic foley     Musculoskeletal: Negative for back pain and myalgias.  Neurological: Negative for dizziness.  Psychiatric/Behavioral: The patient is not nervous/anxious.     Past Medical History:  Diagnosis Date  . Acute CHF (congestive heart failure) ([place]) 02/14/2016  . Acute osteomyelitis, pelvis, right ([place])   . Acute renal failure ([place])   . Anemia in chronic renal disease   . Blood transfusion   . Cardiomyopathy ([place]) 02/17/2016  . Chronic spastic paraplegia (HCC)    secondary to MS  . CKD (chronic kidney disease), stage III   . Decreased sensation of lower extremity    due to MS  . Decubitus ulcer of ischium   . Decubitus ulcer of trochanter, stage 4 ([place]) 06/14/2015  . History of MRSA infection    urine  . History of recurrent UTIs   . Hypertension   . MS (multiple sclerosis) ([place])   . Neuralgia neuritis, sciatic nerve    MS for 30 years  . Neurogenic bladder   . PONV (postoperative nausea and vomiting)   . Self-catheterizes urinary bladder   . Type 2 diabetes mellitus ([place])    Past Surgical History:  Procedure Laterality Date  . APPLICATION OF A-CELL OF EXTREMITY Left 09/05/2014   Procedure: PLACEMENT OF ACELL AND VAC;  Surgeon: Theodoro Kos, DO;  Location: [facility];  Service: Plastics;  Laterality: Left;  . BASCILIC VEIN TRANSPOSITION Right 03/24/2016   Procedure: RADIAL- CEPHALIC FISTULA CREATION RIGHT ARM;  Surgeon: Conrad Meridian, MD;  Location: [facility];  Service: Vascular;  Laterality:  Right;  Marland Kitchen EXTRACORPOREAL SHOCK WAVE LITHOTRIPSY  03-23-2011  . FINGER SURGERY  2004  . INCISION AND DRAINAGE OF WOUND Left 09/05/2014   Procedure: IRRIGATION AND DEBRIDEMENT OF LEFT ISCHIUM WOUND WITH ;  Surgeon: Theodoro Kos, DO;  Location: [facility];  Service: Plastics;  Laterality: Left;  . INSERTION OF DIALYSIS CATHETER N/A 02/27/2016   Procedure: INSERTION OF DIALYSIS CATHETER-RIGHT INTERNAL JUGULA PLACEMENT;  Surgeon: Conrad Tensed, MD;  Location: [facility];  Service: Vascular;  Laterality: N/A;  .  TONSILLECTOMY    . WOUND DEBRIDEMENT     [facility]    Social History:   reports that he quit smoking about 33 years ago. His smoking use included Cigars. He has never used smokeless tobacco. He reports that he does not drink alcohol or use drugs.  Family History  Problem Relation Age of Onset  . Stroke Father   . Diabetes Father   . Diabetes Paternal Grandmother   . Stroke Paternal Grandfather   . Heart disease Neg Hx   . Cancer Neg Hx     Medications: Patient's Medications  New Prescriptions   No medications on file  Previous Medications   ACETAMINOPHEN (TYLENOL) 325 MG TABLET    Take 650 mg by mouth every 6 (six) hours as needed for moderate pain or fever.    AMINO ACIDS-PROTEIN HYDROLYS (FEEDING SUPPLEMENT, PRO-STAT SUGAR FREE 64,) LIQD    Take 82ml by mouth twice daily for wound healing   B COMPLEX-VITAMIN C-FOLIC ACID (NEPHRO-VITE) 0.8 MG TABS TABLET    Take 1 tablet by mouth every morning.    BACLOFEN (LIORESAL) 10 MG TABLET    Take 10 mg by mouth at bedtime as needed for muscle spasms.   BISACODYL (DULCOLAX) 10 MG SUPPOSITORY    Place 10 mg rectally as needed for moderate constipation.   CARVEDILOL (COREG) 3.125 MG TABLET    Take 3.125 mg by mouth 2 (two) times daily with a meal.   COLLAGENASE (SANTYL) OINTMENT    Apply topically daily. APPLY TO LEFT ISCHIAL WOUND DAILY   FERROUS SULFATE 325 (65 FE) MG TABLET    Take 325 mg by mouth 2 (two) times daily with a meal.   INSULIN ASPART (NOVOLOG) 100 UNIT/ML INJECTION    Inject 0-9 Units into the skin 3 (three) times daily with meals. Sliding scale CBG 70 - 120: 0 units CBG 121 - 150: 1 unit,  CBG 151 - 200: 2 units,  CBG 201 - 250: 3 units,  CBG 251 - 300: 5 units,  CBG 301 - 350: 7 units,  CBG 351 - 400: 9 units   CBG > 400: 9 units and notify your MD   INSULIN GLARGINE (LANTUS) 100 UNIT/ML INJECTION    Inject 12 Units into the skin every morning.    LINACLOTIDE (LINZESS) 145 MCG CAPS CAPSULE    Take 145 mcg by mouth every  other day.   MAGNESIUM HYDROXIDE (MILK OF MAGNESIA) 400 MG/5ML SUSPENSION    Take 30 mLs by mouth daily as needed for mild constipation.   MELATONIN 3 MG TABS    Take 3 mg by mouth at bedtime.   NUTRITIONAL SUPPLEMENTS (NEPRO) LIQD    Take 237 mLs by mouth 2 (two) times daily. One can by mouth twice daily for supplement/wound healing    OXYBUTYNIN (DITROPAN) 5 MG TABLET    Take 5 mg by mouth 2 (two) times daily.    PANTOPRAZOLE (PROTONIX) 40 MG TABLET    Take 1  tablet (40 mg total) by mouth daily before breakfast.   SERTRALINE (ZOLOFT) 50 MG TABLET    Take 50 mg by mouth at bedtime.    SKIN PROTECTANTS, MISC. (EUCERIN) CREAM    Apply 1 application topically daily. For feet   SODIUM PHOSPHATES (RA SALINE ENEMA) 19-7 GM/118ML ENEM    Place 1 application rectally as needed.   SUCROFERRIC OXYHYDROXIDE (VELPHORO) 500 MG CHEWABLE TABLET    Chew 1,000 mg by mouth 3 (three) times daily with meals.   TRAMADOL (ULTRAM) 50 MG TABLET    Take 1 tablet (50 mg total) by mouth every 6 (six) hours as needed for severe pain.   VANCOMYCIN (VANCOCIN) 750-5 MG/150ML-% SOLN    Inject 150 mLs (750 mg total) into the vein every Monday, Wednesday, and Friday with hemodialysis. X 4 weeks, further continuation to be determined by Dr Victoriano Lain Medications   No medications on file  Discontinued Medications   POLYETHYLENE GLYCOL (MIRALAX / GLYCOLAX) PACKET    Take 17 g by mouth daily as needed for mild constipation.   PROMETHAZINE (PHENERGAN) 25 MG TABLET    Take 25 mg by mouth every 6 (six) hours as needed for nausea or vomiting.   SUCROFERRIC OXYHYDROXIDE (VELPHORO) 500 MG CHEWABLE TABLET    Chew 1,000 mg by mouth 3 (three) times daily with meals.     Physical Exam: Vitals:   07/15/16 1202  BP: 116/74  Pulse: 82  Resp: 20  Temp: 97.5 F (36.4 C)  SpO2: 96%  Weight: 157 lb 4.8 oz (71.4 kg)  Height: 6\' 4"  (1.93 m)    Physical Exam  Constitutional: He is oriented to person, place, and time. He appears  well-developed and well-nourished. No distress.  HENT:  Head: Normocephalic and atraumatic.  Mouth/Throat: Oropharynx is clear and moist. No oropharyngeal exudate.  Eyes: Conjunctivae and EOM are normal. Pupils are equal, round, and reactive to light.  Neck: Normal range of motion. Neck supple.  Cardiovascular: Normal rate, regular rhythm and normal heart sounds.   Pulmonary/Chest: Effort normal and breath sounds normal.  Abdominal: Soft. Bowel sounds are normal.  Genitourinary:  Genitourinary Comments: Foley catheter with clear yellow urine  Musculoskeletal: He exhibits no edema or tenderness.  Atrophied muscle of the lower and upper extremities   Neurological: He is alert and oriented to person, place, and time.  Paraplegia due to MS  Skin: Skin is warm and dry. He is not diaphoretic.  Wound vac  Psychiatric: He has a normal mood and affect.    Labs reviewed: Basic Metabolic Panel:  Recent Labs  02/19/16 0445  02/20/16 0420  05/12/16 0615  05/15/16 1919 05/18/16 0700  07/03/16 0910 07/04/16 0908 07/05/16 0703  NA 138  < > 136  < > 133*  < > 134* 134*  < > 138 139  139 136  K 4.5  < > 4.3  < > 4.4  < > 4.0 4.8  < > 3.8 3.6  3.6 3.6  CL 103  < > 102  < > 99*  < > 96* 97*  < > 101 101  102 98*  CO2 27  < > 24  < > 23  < > 27 25  < > 28 29  30 29   GLUCOSE 104*  < > 139*  < > 109*  < > 156* 112*  < > 164* 84  84 113*  BUN 38*  < > 25*  < > 30*  < > 9  40*  < > 40* 54*  54* 25*  CREATININE 1.78*  < > 1.60*  < > 3.02*  < > 1.67* 4.74*  < > 3.39* 4.04*  4.03* 2.71*  CALCIUM 8.5*  < > 8.6*  < > 8.4*  < > 8.1* 9.0  < > 8.9 9.1  9.1 9.0  MG 2.7*  --  2.6*  --  1.9  --   --   --   --   --   --   --   PHOS 3.6  < > 2.4*  < >  --   --  1.5* 6.0*  --   --  2.5  --   < > = values in this interval not displayed. Liver Function Tests:  Recent Labs  03/09/16 0833  04/20/16 05/09/16 1151  05/18/16 0700 07/02/16 2107 07/04/16 0908  AST 15  --  10* 16  --   --  18  --   ALT  17  --  7* 13*  --   --  15*  --   ALKPHOS 68  --  68 67  --   --  60  --   BILITOT 0.4  --   --  0.8  --   --  0.3  --   PROT 6.7  --   --  7.3  --   --  7.5  --   ALBUMIN 2.0*  2.0*  < >  --  2.1*  < > 1.5* 2.1* 2.1*  < > = values in this interval not displayed.  Recent Labs  01/30/16 1305  LIPASE 18   No results for input(s): AMMONIA in the last 8760 hours. CBC:  Recent Labs  03/03/16 0714  05/09/16 1151  07/02/16 2107 07/03/16 0910 07/03/16 1448 07/04/16 0908 07/05/16 0703  WBC 6.3  < > 9.5  < > 9.2 7.3  --  9.2 8.7  NEUTROABS 4.0  --  7.7  --  4.7  --   --   --   --   HGB 7.2*  < > 9.4*  < > 3.7* 9.1* 9.5* 9.2* 9.3*  HCT 22.4*  < > 29.8*  < > 12.2* 27.6* 29.5* 28.7* 29.7*  MCV 94.1  < > 97.7  < > 97.6 92.6  --  93.2 94.9  PLT 226  < > 199  < > 151 127*  --  160 170  < > = values in this interval not displayed. TSH: No results for input(s): TSH in the last 8760 hours. A1C: Lab Results  Component Value Date   HGBA1C 6.1 04/10/2016   Lipid Panel: No results for input(s): CHOL, HDL, LDLCALC, TRIG, CHOLHDL, LDLDIRECT in the last 8760 hours.   Assessment/Plan 1. Reactive depression - sertraline (ZOLOFT) 50 MG tablet; Take 1 tablet (50 mg total) by mouth at bedtime.  Dispense: 30 tablet; Refill: 0  2. Essential hypertension, benign Stable on current regimen.  - carvedilol (COREG) 3.125 MG tablet; Take 1 tablet (3.125 mg total) by mouth 2 (two) times daily with a meal.  Dispense: 60 tablet; Refill: 0  3. Controlled type 2 diabetes mellitus with chronic kidney disease on chronic dialysis, with long-term current use of insulin (HCC) -a1C  At goal in dec. Cont insulin at this time. - insulin aspart (NOVOLOG) 100 UNIT/ML injection; Inject 0-9 Units into the skin 3 (three) times daily with meals. Per sliding scale  Dispense: 10 mL; Refill: 0 - insulin glargine (LANTUS) 100 UNIT/ML injection;  Inject 0.12 mLs (12 Units total) into the skin every morning.  Dispense: 10 mL;  Refill: 0  4. Multiple sclerosis (HCC) Stable,ongoing weakness and LE paralysis.  - baclofen (LIORESAL) 10 MG tablet; Take 1 tablet (10 mg total) by mouth at bedtime as needed for muscle spasms.  Dispense: 30 each; Refill: 0  5. Osteomyelitis of other site, unspecified type ([place]) vanc being administered during dialysis. Has follow up with ID scheduled.  6. Recurrent UTI (urinary tract infection) Following urology and ID at this time.   7. Severe protein-calorie malnutrition ([place]) conts on supplements.   8. Impaired functional mobility, balance, and endurance Will cont to need therapy via home health.  9. ESRD (end stage renal disease) on dialysis ([place]) Cont on HD Tuesday, thursday, Saturday, takes scat  - b complex-vitamin c-folic acid (NEPHRO-VITE) 0.8 MG TABS tablet; Take 1 tablet by mouth every morning.  Dispense: 30 tablet; Refill: 0  10. Slow transit constipation - linaclotide (LINZESS) 145 MCG CAPS capsule; Take 1 capsule (145 mcg total) by mouth every other day.  Dispense: 30 capsule; Refill: 0  11. Decubitus ulcer of right ischium, unstageable ([place]) - wound care in facility has been following while at [facility] but once pt discharges will need to go back to wound care center. Staff to make appt with wound care center prior to discharge. Also following with [facility] and ID due to osteomyelitis.   12. Anemia Clinically stable; Will follow up CBC prior to discharge. Cont on iron supplements  pt is stable for discharge-will need PT/OT/nursing per home health. DME needed includes hoyer lift and air mattress and wound vac. Rx sent via epic for 1 month supply..  will need to follow up with PCP within 2 weeks.    Carlos American. Harle Battiest  [facility] [phone] 8 am - 5 pm) [phone] (after hours)

## 2016-07-16 ENCOUNTER — Other Ambulatory Visit: Payer: Self-pay | Admitting: *Deleted

## 2016-07-16 NOTE — Patient Outreach (Signed)
[facility]) Care Management  07/16/2016  Lance [name] [place] [birth year] [id number]   Met with patient at bedside to assess care management needs.  Patient reports that He is going home on Saturday. He lives with his wife and a disabled son. He is bedbound.  He reports he has been in and out of the hospital and facility for the past 2 years. He is concerned about transition home.  He states he will have hoyer lift, wife is learning how to use it before he discharges. He is working on getting his bed and mattress. He states that he has hired a Agricultural engineer to assist with care planning activities as his children live out of state. He states he will have home care due to wounds, states he has a wound that is slow healing.   RNCM reviewed [facility] care management program, as it is free, he is interested for future needs and accepted brochure.   Plan to sign off at this time, encouraged patient that he should call [facility] for future needs, he agrees.   [name] [name], RN, BSN, [place] [phone]) Business Cell  [phone]) Toll Free Office

## 2016-07-17 DIAGNOSIS — M4628 Osteomyelitis of vertebra, sacral and sacrococcygeal region: Secondary | ICD-10-CM | POA: Diagnosis not present

## 2016-07-17 DIAGNOSIS — N186 End stage renal disease: Secondary | ICD-10-CM | POA: Diagnosis not present

## 2016-07-17 DIAGNOSIS — N2581 Secondary hyperparathyroidism of renal origin: Secondary | ICD-10-CM | POA: Diagnosis not present

## 2016-07-17 DIAGNOSIS — D631 Anemia in chronic kidney disease: Secondary | ICD-10-CM | POA: Diagnosis not present

## 2016-07-20 ENCOUNTER — Telehealth: Payer: Self-pay | Admitting: *Deleted

## 2016-07-20 ENCOUNTER — Other Ambulatory Visit: Payer: Self-pay

## 2016-07-20 DIAGNOSIS — I120 Hypertensive chronic kidney disease with stage 5 chronic kidney disease or end stage renal disease: Secondary | ICD-10-CM | POA: Diagnosis not present

## 2016-07-20 DIAGNOSIS — L89314 Pressure ulcer of right buttock, stage 4: Secondary | ICD-10-CM | POA: Diagnosis not present

## 2016-07-20 DIAGNOSIS — Z794 Long term (current) use of insulin: Secondary | ICD-10-CM | POA: Diagnosis not present

## 2016-07-20 DIAGNOSIS — R1312 Dysphagia, oropharyngeal phase: Secondary | ICD-10-CM | POA: Diagnosis not present

## 2016-07-20 DIAGNOSIS — Z466 Encounter for fitting and adjustment of urinary device: Secondary | ICD-10-CM | POA: Diagnosis not present

## 2016-07-20 DIAGNOSIS — E785 Hyperlipidemia, unspecified: Secondary | ICD-10-CM | POA: Diagnosis not present

## 2016-07-20 DIAGNOSIS — N319 Neuromuscular dysfunction of bladder, unspecified: Secondary | ICD-10-CM | POA: Diagnosis not present

## 2016-07-20 DIAGNOSIS — Z8701 Personal history of pneumonia (recurrent): Secondary | ICD-10-CM | POA: Diagnosis not present

## 2016-07-20 DIAGNOSIS — I429 Cardiomyopathy, unspecified: Secondary | ICD-10-CM | POA: Diagnosis not present

## 2016-07-20 DIAGNOSIS — Z87440 Personal history of urinary (tract) infections: Secondary | ICD-10-CM | POA: Diagnosis not present

## 2016-07-20 DIAGNOSIS — N186 End stage renal disease: Secondary | ICD-10-CM | POA: Diagnosis not present

## 2016-07-20 DIAGNOSIS — E1122 Type 2 diabetes mellitus with diabetic chronic kidney disease: Secondary | ICD-10-CM | POA: Diagnosis not present

## 2016-07-20 DIAGNOSIS — L89222 Pressure ulcer of left hip, stage 2: Secondary | ICD-10-CM | POA: Diagnosis not present

## 2016-07-20 DIAGNOSIS — Z992 Dependence on renal dialysis: Secondary | ICD-10-CM | POA: Diagnosis not present

## 2016-07-20 DIAGNOSIS — G35 Multiple sclerosis: Secondary | ICD-10-CM | POA: Diagnosis not present

## 2016-07-20 DIAGNOSIS — D638 Anemia in other chronic diseases classified elsewhere: Secondary | ICD-10-CM | POA: Diagnosis not present

## 2016-07-20 MED ORDER — INSULIN GLARGINE 100 UNITS/ML SOLOSTAR PEN: 12.0000 [IU] | PEN_INJECTOR | Freq: Every day | SUBCUTANEOUS | 0 refills | Status: DC

## 2016-07-20 MED ORDER — PEN NEEDLES 30G X 8 MM MISC: 1.0000 | Freq: Four times a day (QID) | 0 refills | Status: DC

## 2016-07-20 MED ORDER — INSULIN ASPART 100 UNIT/ML FLEXPEN: 0.0000 [IU] | PEN_INJECTOR | Freq: Three times a day (TID) | SUBCUTANEOUS | 0 refills | Status: DC

## 2016-07-20 NOTE — Telephone Encounter (Signed)
Lance [name] with [facility] called and stated that you discharged patient home with Lantus and Novolog Vials. Patient is not comfortable with the vials and wants the Insulin Pens. Takes Lantus 12 units once daily and Novolog Sliding Scale. Also wants the Pen Needles. Patient informed nurse that he has not taken any insulin since being discharged. Please Advise.

## 2016-07-20 NOTE — Telephone Encounter (Signed)
The following message was received by clinical intake today:  Carmell Austria with [place] called and stated that you discharged patient home with Lantus and Novolog Vials. Patient is not comfortable with the vials and wants the Insulin Pens. Takes Lantus 12 units once daily and Novolog Sliding Scale. Also wants the Pen Needles. Patient informed nurse that he has not taken any insulin since being discharged. Please Advise.   Rx was sent to pharmacy for both Novolog Flexpens and Lantus Solostar pens, as well as, pen needles. Pharmacy and patient have been notified of the change from vial to pens.

## 2016-07-20 NOTE — Telephone Encounter (Signed)
Okay to fax 1 month supply of pens to pharmacy

## 2016-07-20 NOTE — Telephone Encounter (Signed)
Rx have been faxed to pharmacy. Please see medication refill note for details. Patient has been notified of the change.

## 2016-07-20 NOTE — Progress Notes (Signed)
    Postoperative Access Visit   History of Present Illness  Lance [name] is a 76 y.o. year old male who presents for postoperative follow-up for: R RC AVF (Date: 03/24/16).  The patient's wounds are healed.  The patient notes no steal symptoms.  The patient is able to complete their activities of daily living.  The patient's current symptoms are: none.  Pt returns for evaluation of R RC AVF for side branches.  For VQI Use Only  PRE-ADM LIVING: Nursing home  AMB STATUS: Wheelchair   Physical Examination Vitals:   07/24/16 1004  BP: (!) 145/72  Pulse: 86  Resp: 16  Temp: 97.3 F (36.3 C)    RUE: Incision is healed, skin feels warm, hand grip is 5/5, sensation in digits is intact, palpable thrill, bruit can be auscultated, on Sonosite: proximal 2/3 fistula > 6 mm, obvious large side branches can be seen on the skin  RLE Access Duplex  (07/24/2016)  3.5-6.6 mm diameter  1.7-3.0 mm depth  Medical Decision Making  Lance [name] is a 76 y.o. year old male who presents s/p R RC AVF.   The distal fistula will likely be easily cannulated.  The more proximal extent might be more difficult, though there is 8-10 cm of >6 mm fistula.  We discussed proceeding with cannulation attempts.  If not successful, this patient needs side branch ligation.  The patient's tunneled dialysis catheter can be removed after two successful cannulations and completed dialysis treatments.  Thank you for allowing [place] to participate in this patient's care.  Adele Barthel, MD, FACS [facility] Office: [phone] Pager: [phone]

## 2016-07-21 DIAGNOSIS — D631 Anemia in chronic kidney disease: Secondary | ICD-10-CM | POA: Diagnosis not present

## 2016-07-21 DIAGNOSIS — G35 Multiple sclerosis: Secondary | ICD-10-CM | POA: Diagnosis not present

## 2016-07-21 DIAGNOSIS — I120 Hypertensive chronic kidney disease with stage 5 chronic kidney disease or end stage renal disease: Secondary | ICD-10-CM | POA: Diagnosis not present

## 2016-07-21 DIAGNOSIS — E1122 Type 2 diabetes mellitus with diabetic chronic kidney disease: Secondary | ICD-10-CM | POA: Diagnosis not present

## 2016-07-21 DIAGNOSIS — N2581 Secondary hyperparathyroidism of renal origin: Secondary | ICD-10-CM | POA: Diagnosis not present

## 2016-07-21 DIAGNOSIS — M8668 Other chronic osteomyelitis, other site: Secondary | ICD-10-CM | POA: Diagnosis not present

## 2016-07-21 DIAGNOSIS — L89314 Pressure ulcer of right buttock, stage 4: Secondary | ICD-10-CM | POA: Diagnosis not present

## 2016-07-21 DIAGNOSIS — N186 End stage renal disease: Secondary | ICD-10-CM | POA: Diagnosis not present

## 2016-07-21 DIAGNOSIS — L89222 Pressure ulcer of left hip, stage 2: Secondary | ICD-10-CM | POA: Diagnosis not present

## 2016-07-22 DIAGNOSIS — E1122 Type 2 diabetes mellitus with diabetic chronic kidney disease: Secondary | ICD-10-CM | POA: Diagnosis not present

## 2016-07-22 DIAGNOSIS — G35 Multiple sclerosis: Secondary | ICD-10-CM | POA: Diagnosis not present

## 2016-07-22 DIAGNOSIS — L89314 Pressure ulcer of right buttock, stage 4: Secondary | ICD-10-CM | POA: Diagnosis not present

## 2016-07-22 DIAGNOSIS — L89222 Pressure ulcer of left hip, stage 2: Secondary | ICD-10-CM | POA: Diagnosis not present

## 2016-07-22 DIAGNOSIS — N186 End stage renal disease: Secondary | ICD-10-CM | POA: Diagnosis not present

## 2016-07-22 DIAGNOSIS — I120 Hypertensive chronic kidney disease with stage 5 chronic kidney disease or end stage renal disease: Secondary | ICD-10-CM | POA: Diagnosis not present

## 2016-07-24 ENCOUNTER — Encounter: Payer: Self-pay | Admitting: Vascular Surgery

## 2016-07-24 ENCOUNTER — Ambulatory Visit (HOSPITAL_COMMUNITY)
Admission: RE | Admit: 2016-07-24 | Discharge: 2016-07-24 | Disposition: A | Discharge status: Home / Self Care | Payer: Medicare Other | Source: Ambulatory Visit | Attending: Vascular Surgery | Admitting: Vascular Surgery

## 2016-07-24 ENCOUNTER — Ambulatory Visit (INDEPENDENT_AMBULATORY_CARE_PROVIDER_SITE_OTHER): Payer: Self-pay | Admitting: Vascular Surgery

## 2016-07-24 VITALS — BP 145/72 | HR 86 | Temp 97.3°F | Resp 16 | Ht 76.0 in | Wt 157.0 lb

## 2016-07-24 DIAGNOSIS — N186 End stage renal disease: Secondary | ICD-10-CM | POA: Insufficient documentation

## 2016-07-24 DIAGNOSIS — N2581 Secondary hyperparathyroidism of renal origin: Secondary | ICD-10-CM | POA: Diagnosis not present

## 2016-07-24 DIAGNOSIS — Z992 Dependence on renal dialysis: Secondary | ICD-10-CM | POA: Insufficient documentation

## 2016-07-24 DIAGNOSIS — D631 Anemia in chronic kidney disease: Secondary | ICD-10-CM | POA: Diagnosis not present

## 2016-07-24 DIAGNOSIS — M8668 Other chronic osteomyelitis, other site: Secondary | ICD-10-CM | POA: Diagnosis not present

## 2016-07-24 MED ORDER — LIDOCAINE-PRILOCAINE 2.5-2.5 % EX CREA: 1.0000 "application " | TOPICAL_CREAM | CUTANEOUS | 1 refills | Status: DC | PRN

## 2016-07-25 DIAGNOSIS — N2581 Secondary hyperparathyroidism of renal origin: Secondary | ICD-10-CM | POA: Diagnosis not present

## 2016-07-25 DIAGNOSIS — M8668 Other chronic osteomyelitis, other site: Secondary | ICD-10-CM | POA: Diagnosis not present

## 2016-07-25 DIAGNOSIS — G35 Multiple sclerosis: Secondary | ICD-10-CM | POA: Diagnosis not present

## 2016-07-25 DIAGNOSIS — L89314 Pressure ulcer of right buttock, stage 4: Secondary | ICD-10-CM | POA: Diagnosis not present

## 2016-07-25 DIAGNOSIS — I120 Hypertensive chronic kidney disease with stage 5 chronic kidney disease or end stage renal disease: Secondary | ICD-10-CM | POA: Diagnosis not present

## 2016-07-25 DIAGNOSIS — E1122 Type 2 diabetes mellitus with diabetic chronic kidney disease: Secondary | ICD-10-CM | POA: Diagnosis not present

## 2016-07-25 DIAGNOSIS — N186 End stage renal disease: Secondary | ICD-10-CM | POA: Diagnosis not present

## 2016-07-25 DIAGNOSIS — D631 Anemia in chronic kidney disease: Secondary | ICD-10-CM | POA: Diagnosis not present

## 2016-07-25 DIAGNOSIS — L89222 Pressure ulcer of left hip, stage 2: Secondary | ICD-10-CM | POA: Diagnosis not present

## 2016-07-26 DIAGNOSIS — L89222 Pressure ulcer of left hip, stage 2: Secondary | ICD-10-CM | POA: Diagnosis not present

## 2016-07-26 DIAGNOSIS — L89314 Pressure ulcer of right buttock, stage 4: Secondary | ICD-10-CM | POA: Diagnosis not present

## 2016-07-26 DIAGNOSIS — G35 Multiple sclerosis: Secondary | ICD-10-CM | POA: Diagnosis not present

## 2016-07-26 DIAGNOSIS — E1122 Type 2 diabetes mellitus with diabetic chronic kidney disease: Secondary | ICD-10-CM | POA: Diagnosis not present

## 2016-07-26 DIAGNOSIS — N186 End stage renal disease: Secondary | ICD-10-CM | POA: Diagnosis not present

## 2016-07-26 DIAGNOSIS — I120 Hypertensive chronic kidney disease with stage 5 chronic kidney disease or end stage renal disease: Secondary | ICD-10-CM | POA: Diagnosis not present

## 2016-07-27 DIAGNOSIS — I120 Hypertensive chronic kidney disease with stage 5 chronic kidney disease or end stage renal disease: Secondary | ICD-10-CM | POA: Diagnosis not present

## 2016-07-27 DIAGNOSIS — E1122 Type 2 diabetes mellitus with diabetic chronic kidney disease: Secondary | ICD-10-CM | POA: Diagnosis not present

## 2016-07-27 DIAGNOSIS — N186 End stage renal disease: Secondary | ICD-10-CM | POA: Diagnosis not present

## 2016-07-27 DIAGNOSIS — L89222 Pressure ulcer of left hip, stage 2: Secondary | ICD-10-CM | POA: Diagnosis not present

## 2016-07-27 DIAGNOSIS — L89314 Pressure ulcer of right buttock, stage 4: Secondary | ICD-10-CM | POA: Diagnosis not present

## 2016-07-27 DIAGNOSIS — G35 Multiple sclerosis: Secondary | ICD-10-CM | POA: Diagnosis not present

## 2016-07-28 ENCOUNTER — Ambulatory Visit: Payer: Medicare Other | Admitting: Internal Medicine

## 2016-07-28 DIAGNOSIS — L89222 Pressure ulcer of left hip, stage 2: Secondary | ICD-10-CM | POA: Diagnosis not present

## 2016-07-28 DIAGNOSIS — D631 Anemia in chronic kidney disease: Secondary | ICD-10-CM | POA: Diagnosis not present

## 2016-07-28 DIAGNOSIS — I120 Hypertensive chronic kidney disease with stage 5 chronic kidney disease or end stage renal disease: Secondary | ICD-10-CM | POA: Diagnosis not present

## 2016-07-28 DIAGNOSIS — L89314 Pressure ulcer of right buttock, stage 4: Secondary | ICD-10-CM | POA: Diagnosis not present

## 2016-07-28 DIAGNOSIS — G35 Multiple sclerosis: Secondary | ICD-10-CM | POA: Diagnosis not present

## 2016-07-28 DIAGNOSIS — E1122 Type 2 diabetes mellitus with diabetic chronic kidney disease: Secondary | ICD-10-CM | POA: Diagnosis not present

## 2016-07-28 DIAGNOSIS — N2581 Secondary hyperparathyroidism of renal origin: Secondary | ICD-10-CM | POA: Diagnosis not present

## 2016-07-28 DIAGNOSIS — M8668 Other chronic osteomyelitis, other site: Secondary | ICD-10-CM | POA: Diagnosis not present

## 2016-07-28 DIAGNOSIS — N186 End stage renal disease: Secondary | ICD-10-CM | POA: Diagnosis not present

## 2016-07-29 DIAGNOSIS — R8290 Unspecified abnormal findings in urine: Secondary | ICD-10-CM | POA: Diagnosis not present

## 2016-07-29 DIAGNOSIS — E1122 Type 2 diabetes mellitus with diabetic chronic kidney disease: Secondary | ICD-10-CM | POA: Diagnosis not present

## 2016-07-29 DIAGNOSIS — I120 Hypertensive chronic kidney disease with stage 5 chronic kidney disease or end stage renal disease: Secondary | ICD-10-CM | POA: Diagnosis not present

## 2016-07-29 DIAGNOSIS — N186 End stage renal disease: Secondary | ICD-10-CM | POA: Diagnosis not present

## 2016-07-29 DIAGNOSIS — G35 Multiple sclerosis: Secondary | ICD-10-CM | POA: Diagnosis not present

## 2016-07-29 DIAGNOSIS — L89222 Pressure ulcer of left hip, stage 2: Secondary | ICD-10-CM | POA: Diagnosis not present

## 2016-07-29 DIAGNOSIS — L89314 Pressure ulcer of right buttock, stage 4: Secondary | ICD-10-CM | POA: Diagnosis not present

## 2016-07-29 DIAGNOSIS — R8299 Other abnormal findings in urine: Secondary | ICD-10-CM | POA: Diagnosis not present

## 2016-07-30 DIAGNOSIS — N2581 Secondary hyperparathyroidism of renal origin: Secondary | ICD-10-CM | POA: Diagnosis not present

## 2016-07-30 DIAGNOSIS — N186 End stage renal disease: Secondary | ICD-10-CM | POA: Diagnosis not present

## 2016-07-30 DIAGNOSIS — M8668 Other chronic osteomyelitis, other site: Secondary | ICD-10-CM | POA: Diagnosis not present

## 2016-07-30 DIAGNOSIS — D631 Anemia in chronic kidney disease: Secondary | ICD-10-CM | POA: Diagnosis not present

## 2016-07-31 DIAGNOSIS — I120 Hypertensive chronic kidney disease with stage 5 chronic kidney disease or end stage renal disease: Secondary | ICD-10-CM | POA: Diagnosis not present

## 2016-07-31 DIAGNOSIS — L89314 Pressure ulcer of right buttock, stage 4: Secondary | ICD-10-CM | POA: Diagnosis not present

## 2016-07-31 DIAGNOSIS — N186 End stage renal disease: Secondary | ICD-10-CM | POA: Diagnosis not present

## 2016-07-31 DIAGNOSIS — L89222 Pressure ulcer of left hip, stage 2: Secondary | ICD-10-CM | POA: Diagnosis not present

## 2016-07-31 DIAGNOSIS — G35 Multiple sclerosis: Secondary | ICD-10-CM | POA: Diagnosis not present

## 2016-07-31 DIAGNOSIS — E1122 Type 2 diabetes mellitus with diabetic chronic kidney disease: Secondary | ICD-10-CM | POA: Diagnosis not present

## 2016-08-01 DIAGNOSIS — E1122 Type 2 diabetes mellitus with diabetic chronic kidney disease: Secondary | ICD-10-CM | POA: Diagnosis not present

## 2016-08-01 DIAGNOSIS — N186 End stage renal disease: Secondary | ICD-10-CM | POA: Diagnosis not present

## 2016-08-01 DIAGNOSIS — Z992 Dependence on renal dialysis: Secondary | ICD-10-CM | POA: Diagnosis not present

## 2016-08-03 DIAGNOSIS — N2581 Secondary hyperparathyroidism of renal origin: Secondary | ICD-10-CM | POA: Diagnosis not present

## 2016-08-03 DIAGNOSIS — D631 Anemia in chronic kidney disease: Secondary | ICD-10-CM | POA: Diagnosis not present

## 2016-08-03 DIAGNOSIS — L89314 Pressure ulcer of right buttock, stage 4: Secondary | ICD-10-CM | POA: Diagnosis not present

## 2016-08-03 DIAGNOSIS — E1122 Type 2 diabetes mellitus with diabetic chronic kidney disease: Secondary | ICD-10-CM | POA: Diagnosis not present

## 2016-08-03 DIAGNOSIS — L89222 Pressure ulcer of left hip, stage 2: Secondary | ICD-10-CM | POA: Diagnosis not present

## 2016-08-03 DIAGNOSIS — D509 Iron deficiency anemia, unspecified: Secondary | ICD-10-CM | POA: Diagnosis not present

## 2016-08-03 DIAGNOSIS — G35 Multiple sclerosis: Secondary | ICD-10-CM | POA: Diagnosis not present

## 2016-08-03 DIAGNOSIS — N186 End stage renal disease: Secondary | ICD-10-CM | POA: Diagnosis not present

## 2016-08-03 DIAGNOSIS — I120 Hypertensive chronic kidney disease with stage 5 chronic kidney disease or end stage renal disease: Secondary | ICD-10-CM | POA: Diagnosis not present

## 2016-08-04 DIAGNOSIS — D631 Anemia in chronic kidney disease: Secondary | ICD-10-CM | POA: Diagnosis not present

## 2016-08-04 DIAGNOSIS — D509 Iron deficiency anemia, unspecified: Secondary | ICD-10-CM | POA: Diagnosis not present

## 2016-08-04 DIAGNOSIS — E1122 Type 2 diabetes mellitus with diabetic chronic kidney disease: Secondary | ICD-10-CM | POA: Diagnosis not present

## 2016-08-04 DIAGNOSIS — N2581 Secondary hyperparathyroidism of renal origin: Secondary | ICD-10-CM | POA: Diagnosis not present

## 2016-08-04 DIAGNOSIS — N186 End stage renal disease: Secondary | ICD-10-CM | POA: Diagnosis not present

## 2016-08-05 ENCOUNTER — Telehealth: Payer: Self-pay | Admitting: *Deleted

## 2016-08-05 DIAGNOSIS — N186 End stage renal disease: Secondary | ICD-10-CM | POA: Diagnosis not present

## 2016-08-05 DIAGNOSIS — L89222 Pressure ulcer of left hip, stage 2: Secondary | ICD-10-CM | POA: Diagnosis not present

## 2016-08-05 DIAGNOSIS — G35 Multiple sclerosis: Secondary | ICD-10-CM | POA: Diagnosis not present

## 2016-08-05 DIAGNOSIS — I120 Hypertensive chronic kidney disease with stage 5 chronic kidney disease or end stage renal disease: Secondary | ICD-10-CM | POA: Diagnosis not present

## 2016-08-05 DIAGNOSIS — L89314 Pressure ulcer of right buttock, stage 4: Secondary | ICD-10-CM | POA: Diagnosis not present

## 2016-08-05 DIAGNOSIS — E1122 Type 2 diabetes mellitus with diabetic chronic kidney disease: Secondary | ICD-10-CM | POA: Diagnosis not present

## 2016-08-05 NOTE — Telephone Encounter (Signed)
Received denial from [facility] for [place]/Prilocn 2.5-2.5% cream 08/05/16 Qty: 30  Sig: Apply 1 application topically as needed    Rx written 07/24/2016

## 2016-08-06 DIAGNOSIS — D631 Anemia in chronic kidney disease: Secondary | ICD-10-CM | POA: Diagnosis not present

## 2016-08-06 DIAGNOSIS — M8668 Other chronic osteomyelitis, other site: Secondary | ICD-10-CM | POA: Diagnosis not present

## 2016-08-06 DIAGNOSIS — D509 Iron deficiency anemia, unspecified: Secondary | ICD-10-CM | POA: Diagnosis not present

## 2016-08-06 DIAGNOSIS — E1122 Type 2 diabetes mellitus with diabetic chronic kidney disease: Secondary | ICD-10-CM | POA: Diagnosis not present

## 2016-08-06 DIAGNOSIS — N186 End stage renal disease: Secondary | ICD-10-CM | POA: Diagnosis not present

## 2016-08-06 DIAGNOSIS — N2581 Secondary hyperparathyroidism of renal origin: Secondary | ICD-10-CM | POA: Diagnosis not present

## 2016-08-07 DIAGNOSIS — Z992 Dependence on renal dialysis: Secondary | ICD-10-CM | POA: Diagnosis not present

## 2016-08-07 DIAGNOSIS — N186 End stage renal disease: Secondary | ICD-10-CM | POA: Diagnosis not present

## 2016-08-07 DIAGNOSIS — E1122 Type 2 diabetes mellitus with diabetic chronic kidney disease: Secondary | ICD-10-CM | POA: Diagnosis not present

## 2016-08-07 DIAGNOSIS — L89222 Pressure ulcer of left hip, stage 2: Secondary | ICD-10-CM | POA: Diagnosis not present

## 2016-08-07 DIAGNOSIS — I129 Hypertensive chronic kidney disease with stage 1 through stage 4 chronic kidney disease, or unspecified chronic kidney disease: Secondary | ICD-10-CM | POA: Diagnosis not present

## 2016-08-07 DIAGNOSIS — G35 Multiple sclerosis: Secondary | ICD-10-CM | POA: Diagnosis not present

## 2016-08-07 DIAGNOSIS — I120 Hypertensive chronic kidney disease with stage 5 chronic kidney disease or end stage renal disease: Secondary | ICD-10-CM | POA: Diagnosis not present

## 2016-08-07 DIAGNOSIS — Z794 Long term (current) use of insulin: Secondary | ICD-10-CM | POA: Diagnosis not present

## 2016-08-07 DIAGNOSIS — L89314 Pressure ulcer of right buttock, stage 4: Secondary | ICD-10-CM | POA: Diagnosis not present

## 2016-08-07 DIAGNOSIS — Z872 Personal history of diseases of the skin and subcutaneous tissue: Secondary | ICD-10-CM | POA: Diagnosis not present

## 2016-08-08 ENCOUNTER — Encounter (HOSPITAL_COMMUNITY): Payer: Self-pay | Admitting: Emergency Medicine

## 2016-08-08 ENCOUNTER — Emergency Department (HOSPITAL_COMMUNITY)
Admission: EM | Admit: 2016-08-08 | Discharge: 2016-08-08 | Disposition: A | Discharge status: Home / Self Care | Payer: Medicare Other | Attending: Emergency Medicine | Admitting: Emergency Medicine

## 2016-08-08 DIAGNOSIS — Y732 Prosthetic and other implants, materials and accessory gastroenterology and urology devices associated with adverse incidents: Secondary | ICD-10-CM | POA: Insufficient documentation

## 2016-08-08 DIAGNOSIS — Z794 Long term (current) use of insulin: Secondary | ICD-10-CM | POA: Diagnosis not present

## 2016-08-08 DIAGNOSIS — Z79899 Other long term (current) drug therapy: Secondary | ICD-10-CM | POA: Diagnosis not present

## 2016-08-08 DIAGNOSIS — N186 End stage renal disease: Secondary | ICD-10-CM | POA: Diagnosis not present

## 2016-08-08 DIAGNOSIS — I509 Heart failure, unspecified: Secondary | ICD-10-CM | POA: Insufficient documentation

## 2016-08-08 DIAGNOSIS — Z87891 Personal history of nicotine dependence: Secondary | ICD-10-CM | POA: Insufficient documentation

## 2016-08-08 DIAGNOSIS — R1084 Generalized abdominal pain: Secondary | ICD-10-CM | POA: Diagnosis not present

## 2016-08-08 DIAGNOSIS — R319 Hematuria, unspecified: Secondary | ICD-10-CM | POA: Diagnosis not present

## 2016-08-08 DIAGNOSIS — E1122 Type 2 diabetes mellitus with diabetic chronic kidney disease: Secondary | ICD-10-CM | POA: Insufficient documentation

## 2016-08-08 DIAGNOSIS — T839XXA Unspecified complication of genitourinary prosthetic device, implant and graft, initial encounter: Secondary | ICD-10-CM

## 2016-08-08 DIAGNOSIS — I132 Hypertensive heart and chronic kidney disease with heart failure and with stage 5 chronic kidney disease, or end stage renal disease: Secondary | ICD-10-CM | POA: Diagnosis not present

## 2016-08-08 DIAGNOSIS — T83198A Other mechanical complication of other urinary devices and implants, initial encounter: Secondary | ICD-10-CM | POA: Diagnosis not present

## 2016-08-08 DIAGNOSIS — T83098A Other mechanical complication of other indwelling urethral catheter, initial encounter: Secondary | ICD-10-CM | POA: Diagnosis not present

## 2016-08-08 DIAGNOSIS — R310 Gross hematuria: Secondary | ICD-10-CM | POA: Insufficient documentation

## 2016-08-08 DIAGNOSIS — T83498A Other mechanical complication of other prosthetic devices, implants and grafts of genital tract, initial encounter: Secondary | ICD-10-CM | POA: Diagnosis not present

## 2016-08-08 DIAGNOSIS — Z992 Dependence on renal dialysis: Secondary | ICD-10-CM | POA: Insufficient documentation

## 2016-08-08 NOTE — ED Notes (Signed)
Pt on cell phone with triage. Pt encouraged to allow this writer to obtain oral temperature multiple times. Pt continues to talk and does not Regulatory affairs officer.

## 2016-08-08 NOTE — ED Notes (Signed)
Bed: WA01 Expected date:  Expected time:  Means of arrival:  Comments: 76 yo blood in catheter

## 2016-08-08 NOTE — ED Notes (Signed)
Post irrigation pt urine pink in color. Previous out put dark red.

## 2016-08-08 NOTE — ED Triage Notes (Addendum)
Per EMS pt from home with complaint of hematuria post catheter reinserted by home health nurse; denies clots. Pt verbalizes "felt off" when catheter was inserted yesterday.

## 2016-08-08 NOTE — ED Notes (Signed)
Pt called to transport pt home.

## 2016-08-08 NOTE — ED Provider Notes (Signed)
[facility] [facility] DEPT Provider Note   CSN: [id number] Arrival date & time: 08/08/16  1131     History   Chief Complaint Chief Complaint  Patient presents with  . Hematuria    HPI Lance [name] is a 76 y.o. male.  The history is provided by the patient.  Hematuria  This is a new problem. The current episode started 12 to 24 hours ago. The problem occurs constantly. The problem has not changed since onset.Associated symptoms comments: Lower abdominal fullness. Exacerbated by: foley placement yesterday. Nothing relieves the symptoms. He has tried nothing for the symptoms.    Past Medical History:  Diagnosis Date  . Acute CHF (congestive heart failure) ([place]) 02/14/2016  . Acute osteomyelitis, pelvis, right ([place])   . Acute renal failure ([place])   . Anemia in chronic renal disease   . Blood transfusion   . Cardiomyopathy ([place]) 02/17/2016  . Chronic spastic paraplegia (HCC)    secondary to MS  . CKD (chronic kidney disease), stage III   . Decreased sensation of lower extremity    due to MS  . Decubitus ulcer of ischium   . Decubitus ulcer of trochanter, stage 4 ([place]) 06/14/2015  . History of MRSA infection    urine  . History of recurrent UTIs   . Hypertension   . MS (multiple sclerosis) ([place])   . Neuralgia neuritis, sciatic nerve    MS for 30 years  . Neurogenic bladder   . PONV (postoperative nausea and vomiting)   . Self-catheterizes urinary bladder   . Type 2 diabetes mellitus [facility])     Patient Active Problem List   Diagnosis Date Noted  . Malnutrition of moderate degree 07/03/2016  . Osteomyelitis ([place]) 07/02/2016  . Symptomatic anemia 07/02/2016  . SIRS (systemic inflammatory response syndrome) ([place]) 05/09/2016  . Melena 05/09/2016  . Hematemesis 05/09/2016  . Non-intractable vomiting with nausea 05/07/2016  . Altered mental status 04/21/2016  . Acute respiratory failure ([place])   . ESRD on dialysis ([place])   . NICM (nonischemic cardiomyopathy) ([place])   .  Impaired functional mobility, balance, and endurance 02/17/2016  . Generalized anxiety disorder 02/17/2016  . Cardiomyopathy ([place]) 02/17/2016  . Hypoxia   . Acute pulmonary edema (HCC)   . Elevated troponin 02/14/2016  . Severe protein-calorie malnutrition ([place]) 02/14/2016  . Acute systolic heart failure ([place]) 02/14/2016  . Depression 02/13/2016  . DM (diabetes mellitus) type II controlled with renal manifestation ([place]) 09/06/2015  . Dyslipidemia associated with type 2 diabetes mellitus ([place]) 09/06/2015  . Recurrent UTI (urinary tract infection) 09/06/2015  . Decubitus ulcer of right ischial area 06/14/2015  . Decubitus ulcer of trochanter, stage 4 ([place]) 06/14/2015  . Left perineal ischial pressure ulcer   . Acute osteomyelitis, pelvis, right ([place])   . Essential hypertension, benign 08/03/2013  . Cellulitis of foot 08/02/2013  . Spastic quadriplegia ([place]) 07/10/2011  . Multiple sclerosis ([place]) 03/30/2011  . Sacral decubitus ulcer 03/30/2011  . Progressive anemia 03/30/2011    Past Surgical History:  Procedure Laterality Date  . APPLICATION OF A-CELL OF EXTREMITY Left 09/05/2014   Procedure: PLACEMENT OF ACELL AND VAC;  Surgeon: Theodoro Kos, DO;  Location: [facility];  Service: Plastics;  Laterality: Left;  . BASCILIC VEIN TRANSPOSITION Right 03/24/2016   Procedure: RADIAL- CEPHALIC FISTULA CREATION RIGHT ARM;  Surgeon: Conrad [place], MD;  Location: [facility];  Service: Vascular;  Laterality: Right;  . EXTRACORPOREAL SHOCK WAVE LITHOTRIPSY  03-23-2011  . FINGER SURGERY  2004  .  INCISION AND DRAINAGE OF WOUND Left 09/05/2014   Procedure: IRRIGATION AND DEBRIDEMENT OF LEFT ISCHIUM WOUND WITH ;  Surgeon: Theodoro Kos, DO;  Location: [place];  Service: Plastics;  Laterality: Left;  . INSERTION OF DIALYSIS CATHETER N/A 02/27/2016   Procedure: INSERTION OF DIALYSIS CATHETER-RIGHT INTERNAL JUGULA PLACEMENT;  Surgeon: Conrad [place], MD;  Location: [facility];  Service: Vascular;  Laterality: N/A;  .  TONSILLECTOMY    . WOUND DEBRIDEMENT     [facility]/2/17[facility]        Home Medications    Prior to Admission medications   Medication Sig Start Date End Date Taking? Authorizing Provider  baclofen (LIORESAL) 10 MG tablet Take 1 tablet (10 mg total) by mouth at bedtime as needed for muscle spasms. 07/15/16  Yes Lauree Chandler, NP  insulin detemir (LEVEMIR) 100 UNIT/ML injection Inject 12 Units into the skin daily.   Yes Historical Provider, MD  acetaminophen (TYLENOL) 325 MG tablet Take 650 mg by mouth every 6 (six) hours as needed for moderate pain or fever.     Historical Provider, MD  Amino Acids-Protein Hydrolys (FEEDING SUPPLEMENT, PRO-STAT SUGAR FREE 64,) LIQD Take 63ml by mouth twice daily for wound healing    Historical Provider, MD  b complex-vitamin c-folic acid (NEPHRO-VITE) 0.8 MG TABS tablet Take 1 tablet by mouth every morning. 07/15/16   Lauree Chandler, NP  bisacodyl (DULCOLAX) 10 MG suppository Place 10 mg rectally as needed for moderate constipation.    Historical Provider, MD  carvedilol (COREG) 3.125 MG tablet Take 1 tablet (3.125 mg total) by mouth 2 (two) times daily with a meal. 07/15/16   Lauree Chandler, NP  collagenase (SANTYL) ointment Apply topically daily. APPLY TO LEFT ISCHIAL WOUND DAILY 07/15/16   Lauree Chandler, NP  ferrous sulfate 325 (65 FE) MG tablet Take 325 mg by mouth 2 (two) times daily with a meal.    Historical Provider, MD  insulin aspart (NOVOLOG FLEXPEN) 100 UNIT/ML FlexPen Inject 0-9 Units into the skin 3 (three) times daily with meals. Per sliding scale 07/20/16   Lauree Chandler, NP  insulin glargine (LANTUS) 100 unit/mL SOPN Inject 0.12 mLs (12 Units total) into the skin daily. Take in the morning. 07/20/16   Lauree Chandler, NP  Insulin Pen Needle (PEN NEEDLES) 30G X 8 MM MISC Inject 1 each into the skin 4 (four) times daily. . 07/20/16   Lauree Chandler, NP  lidocaine-prilocaine (EMLA) cream Apply 1 application topically as needed. 07/24/16    Conrad [place], MD  linaclotide [facility]) 145 MCG CAPS capsule Take 1 capsule (145 mcg total) by mouth every other day. 07/15/16   Lauree Chandler, NP  magnesium hydroxide (MILK OF MAGNESIA) 400 MG/5ML suspension Take 30 mLs by mouth daily as needed for mild constipation.    Historical Provider, MD  Melatonin 3 MG TABS Take 3 mg by mouth at bedtime.    Historical Provider, MD  Nutritional Supplements (NEPRO) LIQD Take 237 mLs by mouth 2 (two) times daily. One can by mouth twice daily for supplement/wound healing     Historical Provider, MD  oxybutynin (DITROPAN) 5 MG tablet Take 1 tablet (5 mg total) by mouth 2 (two) times daily. 07/15/16   Lauree Chandler, NP  pantoprazole (PROTONIX) 40 MG tablet Take 1 tablet (40 mg total) by mouth daily before breakfast. 07/15/16   Lauree Chandler, NP  sertraline (ZOLOFT) 50 MG tablet Take 1 tablet (50 mg total) by mouth at  bedtime. 07/15/16   Lauree Chandler, NP  Skin Protectants, Misc. (EUCERIN) cream Apply 1 application topically daily. For feet    Historical Provider, MD  Sodium Phosphates (RA SALINE ENEMA) 19-7 GM/118ML ENEM Place 1 application rectally as needed. 07/05/16   Ripudeep Krystal Eaton, MD  sucroferric oxyhydroxide (VELPHORO) 500 MG chewable tablet Chew 2 tablets (1,000 mg total) by mouth 3 (three) times daily with meals. 07/15/16   Lauree Chandler, NP  traMADol (ULTRAM) 50 MG tablet Take 1 tablet (50 mg total) by mouth every 6 (six) hours as needed for severe pain. 07/05/16   Ripudeep Krystal Eaton, MD  Vancomycin (VANCOCIN) 750-5 MG/150ML-% SOLN Inject 150 mLs (750 mg total) into the vein every Monday, Wednesday, and Friday with hemodialysis. X 4 weeks, further continuation to be determined by Dr Dellia Nims 07/06/16   Ripudeep Krystal Eaton, MD    Family History Family History  Problem Relation Age of Onset  . Stroke Father   . Diabetes Father   . Diabetes Paternal Grandmother   . Stroke Paternal Grandfather   . Heart disease Neg Hx   . Cancer Neg Hx     Social  History Social History  Substance Use Topics  . Smoking status: Former Smoker    Types: Cigars    Quit date: 05/06/1983  . Smokeless tobacco: Never Used  . Alcohol use No     Comment: pt has not had any since Dec 2017     Allergies   Patient has no known allergies.   Review of Systems Review of Systems  Genitourinary: Positive for hematuria.  All other systems reviewed and are negative.    Physical Exam Updated Vital Signs BP (!) 190/72   Pulse 81   Temp 98.5 F (36.9 C) (Oral)   Resp 18   Wt 157 lb (71.2 kg)   SpO2 97%   BMI 19.11 kg/m   Physical Exam  Constitutional: He is oriented to person, place, and time. He appears well-developed and well-nourished. No distress.  HENT:  Head: Normocephalic and atraumatic.  Nose: Nose normal.  Eyes: Conjunctivae are normal.  Neck: Neck supple. No tracheal deviation present.  Cardiovascular: Normal rate, regular rhythm and normal heart sounds.   Pulmonary/Chest: Effort normal and breath sounds normal. No respiratory distress.  Abdominal: Soft. He exhibits no distension. There is no tenderness. There is no guarding.  Genitourinary: Penis normal.  Genitourinary Comments: Hypospadias, bloody drainage in Foley bag  Neurological: He is alert and oriented to person, place, and time.  Skin: Skin is warm and dry.  Psychiatric: He has a normal mood and affect.  Vitals reviewed.    ED Treatments / Results  Labs (all labs ordered are listed, but only abnormal results are displayed) Labs Reviewed - No data to display  EKG  EKG Interpretation None       Radiology No results found.  Procedures Procedures (including critical care time)  Medications Ordered in ED Medications - No data to display   Initial Impression / Assessment and Plan / ED Course  I have reviewed the triage vital signs and the nursing notes.  Pertinent labs & imaging results that were available during my care of the patient were reviewed by me and  considered in my medical decision making (see chart for details).     76 year old male presents with atraumatic Foley insertion yesterday where he has had no urine output from the Foley catheter but has had dark red blood. He has a sensation of  fullness over his bladder. He attempted to inform nurse that fully felt misplaced yesterday but they encouraged him to increase his fluid intake.  Foley catheter was placed by nursing revealing bloody urine output consistent with misplaced Foley catheter. The patient's blood pressure improved following this intervention. He was irrigated with lightening of urine color suggesting no active clinically significant bleeding. Foley catheter will be left in place for follow-up with home health.  Final Clinical Impressions(s) / ED Diagnoses   Final diagnoses:  Gross hematuria  Complication of Foley catheter, initial encounter [facility])    New Prescriptions New Prescriptions   No medications on file     Leo Grosser, MD 08/09/16 0730

## 2016-08-10 ENCOUNTER — Encounter (HOSPITAL_BASED_OUTPATIENT_CLINIC_OR_DEPARTMENT_OTHER): Payer: Medicare Other | Attending: Internal Medicine

## 2016-08-10 DIAGNOSIS — Z992 Dependence on renal dialysis: Secondary | ICD-10-CM | POA: Insufficient documentation

## 2016-08-10 DIAGNOSIS — L89222 Pressure ulcer of left hip, stage 2: Secondary | ICD-10-CM | POA: Diagnosis not present

## 2016-08-10 DIAGNOSIS — E1040 Type 1 diabetes mellitus with diabetic neuropathy, unspecified: Secondary | ICD-10-CM | POA: Insufficient documentation

## 2016-08-10 DIAGNOSIS — L89314 Pressure ulcer of right buttock, stage 4: Secondary | ICD-10-CM | POA: Insufficient documentation

## 2016-08-10 DIAGNOSIS — Z8614 Personal history of Methicillin resistant Staphylococcus aureus infection: Secondary | ICD-10-CM | POA: Insufficient documentation

## 2016-08-10 DIAGNOSIS — E1122 Type 2 diabetes mellitus with diabetic chronic kidney disease: Secondary | ICD-10-CM | POA: Diagnosis not present

## 2016-08-10 DIAGNOSIS — D631 Anemia in chronic kidney disease: Secondary | ICD-10-CM | POA: Diagnosis not present

## 2016-08-10 DIAGNOSIS — L89322 Pressure ulcer of left buttock, stage 2: Secondary | ICD-10-CM | POA: Insufficient documentation

## 2016-08-10 DIAGNOSIS — E1022 Type 1 diabetes mellitus with diabetic chronic kidney disease: Secondary | ICD-10-CM | POA: Insufficient documentation

## 2016-08-10 DIAGNOSIS — D509 Iron deficiency anemia, unspecified: Secondary | ICD-10-CM | POA: Diagnosis not present

## 2016-08-10 DIAGNOSIS — I120 Hypertensive chronic kidney disease with stage 5 chronic kidney disease or end stage renal disease: Secondary | ICD-10-CM | POA: Diagnosis not present

## 2016-08-10 DIAGNOSIS — G35 Multiple sclerosis: Secondary | ICD-10-CM | POA: Insufficient documentation

## 2016-08-10 DIAGNOSIS — N186 End stage renal disease: Secondary | ICD-10-CM | POA: Diagnosis not present

## 2016-08-10 DIAGNOSIS — Z794 Long term (current) use of insulin: Secondary | ICD-10-CM | POA: Insufficient documentation

## 2016-08-10 DIAGNOSIS — I429 Cardiomyopathy, unspecified: Secondary | ICD-10-CM | POA: Insufficient documentation

## 2016-08-10 DIAGNOSIS — G8220 Paraplegia, unspecified: Secondary | ICD-10-CM | POA: Insufficient documentation

## 2016-08-10 DIAGNOSIS — N2581 Secondary hyperparathyroidism of renal origin: Secondary | ICD-10-CM | POA: Diagnosis not present

## 2016-08-11 DIAGNOSIS — E1122 Type 2 diabetes mellitus with diabetic chronic kidney disease: Secondary | ICD-10-CM | POA: Diagnosis not present

## 2016-08-11 DIAGNOSIS — N2581 Secondary hyperparathyroidism of renal origin: Secondary | ICD-10-CM | POA: Diagnosis not present

## 2016-08-11 DIAGNOSIS — D509 Iron deficiency anemia, unspecified: Secondary | ICD-10-CM | POA: Diagnosis not present

## 2016-08-11 DIAGNOSIS — D631 Anemia in chronic kidney disease: Secondary | ICD-10-CM | POA: Diagnosis not present

## 2016-08-11 DIAGNOSIS — N186 End stage renal disease: Secondary | ICD-10-CM | POA: Diagnosis not present

## 2016-08-12 DIAGNOSIS — L89314 Pressure ulcer of right buttock, stage 4: Secondary | ICD-10-CM | POA: Diagnosis not present

## 2016-08-12 DIAGNOSIS — I120 Hypertensive chronic kidney disease with stage 5 chronic kidney disease or end stage renal disease: Secondary | ICD-10-CM | POA: Diagnosis not present

## 2016-08-12 DIAGNOSIS — L89222 Pressure ulcer of left hip, stage 2: Secondary | ICD-10-CM | POA: Diagnosis not present

## 2016-08-12 DIAGNOSIS — N186 End stage renal disease: Secondary | ICD-10-CM | POA: Diagnosis not present

## 2016-08-12 DIAGNOSIS — E1122 Type 2 diabetes mellitus with diabetic chronic kidney disease: Secondary | ICD-10-CM | POA: Diagnosis not present

## 2016-08-12 DIAGNOSIS — G35 Multiple sclerosis: Secondary | ICD-10-CM | POA: Diagnosis not present

## 2016-08-13 DIAGNOSIS — N2581 Secondary hyperparathyroidism of renal origin: Secondary | ICD-10-CM | POA: Diagnosis not present

## 2016-08-13 DIAGNOSIS — M8668 Other chronic osteomyelitis, other site: Secondary | ICD-10-CM | POA: Diagnosis not present

## 2016-08-13 DIAGNOSIS — N186 End stage renal disease: Secondary | ICD-10-CM | POA: Diagnosis not present

## 2016-08-13 DIAGNOSIS — D631 Anemia in chronic kidney disease: Secondary | ICD-10-CM | POA: Diagnosis not present

## 2016-08-13 DIAGNOSIS — E1122 Type 2 diabetes mellitus with diabetic chronic kidney disease: Secondary | ICD-10-CM | POA: Diagnosis not present

## 2016-08-13 DIAGNOSIS — D509 Iron deficiency anemia, unspecified: Secondary | ICD-10-CM | POA: Diagnosis not present

## 2016-08-14 ENCOUNTER — Encounter (HOSPITAL_COMMUNITY): Payer: Self-pay | Admitting: *Deleted

## 2016-08-14 ENCOUNTER — Emergency Department (HOSPITAL_COMMUNITY)
Admission: EM | Admit: 2016-08-14 | Discharge: 2016-08-14 | Disposition: A | Discharge status: Home / Self Care | Payer: Medicare Other | Attending: Emergency Medicine | Admitting: Emergency Medicine

## 2016-08-14 DIAGNOSIS — T8383XA Hemorrhage of genitourinary prosthetic devices, implants and grafts, initial encounter: Secondary | ICD-10-CM | POA: Diagnosis not present

## 2016-08-14 DIAGNOSIS — R310 Gross hematuria: Secondary | ICD-10-CM | POA: Insufficient documentation

## 2016-08-14 DIAGNOSIS — I132 Hypertensive heart and chronic kidney disease with heart failure and with stage 5 chronic kidney disease, or end stage renal disease: Secondary | ICD-10-CM | POA: Diagnosis not present

## 2016-08-14 DIAGNOSIS — Y732 Prosthetic and other implants, materials and accessory gastroenterology and urology devices associated with adverse incidents: Secondary | ICD-10-CM | POA: Diagnosis not present

## 2016-08-14 DIAGNOSIS — N139 Obstructive and reflux uropathy, unspecified: Secondary | ICD-10-CM | POA: Diagnosis not present

## 2016-08-14 DIAGNOSIS — T83198A Other mechanical complication of other urinary devices and implants, initial encounter: Secondary | ICD-10-CM | POA: Diagnosis not present

## 2016-08-14 DIAGNOSIS — Z87891 Personal history of nicotine dependence: Secondary | ICD-10-CM | POA: Diagnosis not present

## 2016-08-14 DIAGNOSIS — T839XXA Unspecified complication of genitourinary prosthetic device, implant and graft, initial encounter: Secondary | ICD-10-CM

## 2016-08-14 DIAGNOSIS — E1122 Type 2 diabetes mellitus with diabetic chronic kidney disease: Secondary | ICD-10-CM | POA: Insufficient documentation

## 2016-08-14 DIAGNOSIS — T83498A Other mechanical complication of other prosthetic devices, implants and grafts of genital tract, initial encounter: Secondary | ICD-10-CM | POA: Diagnosis not present

## 2016-08-14 DIAGNOSIS — N186 End stage renal disease: Secondary | ICD-10-CM | POA: Insufficient documentation

## 2016-08-14 DIAGNOSIS — I509 Heart failure, unspecified: Secondary | ICD-10-CM | POA: Insufficient documentation

## 2016-08-14 DIAGNOSIS — T83091A Other mechanical complication of indwelling urethral catheter, initial encounter: Secondary | ICD-10-CM | POA: Diagnosis not present

## 2016-08-14 DIAGNOSIS — T83098A Other mechanical complication of other indwelling urethral catheter, initial encounter: Secondary | ICD-10-CM | POA: Insufficient documentation

## 2016-08-14 LAB — PROTIME-INR
INR: 1.07
Prothrombin Time: 14 seconds (ref 11.4–15.2)

## 2016-08-14 LAB — BASIC METABOLIC PANEL
Anion gap: 10 (ref 5–15)
BUN: 43 mg/dL — ABNORMAL HIGH (ref 6–20)
CHLORIDE: 101 mmol/L (ref 101–111)
CO2: 30 mmol/L (ref 22–32)
CREATININE: 2.45 mg/dL — AB (ref 0.61–1.24)
Calcium: 9 mg/dL (ref 8.9–10.3)
GFR calc non Af Amer: 24 mL/min — ABNORMAL LOW (ref >60)
GFR, EST AFRICAN AMERICAN: 28 mL/min — AB (ref >60)
Glucose, Bld: 69 mg/dL (ref 65–99)
POTASSIUM: 3.5 mmol/L (ref 3.5–5.1)
Sodium: 141 mmol/L (ref 135–145)

## 2016-08-14 LAB — CBC WITH DIFFERENTIAL/PLATELET
Basophils Absolute: 0 10*3/uL (ref 0.0–0.1)
Basophils Relative: 0 %
EOS ABS: 0.4 10*3/uL (ref 0.0–0.7)
Eosinophils Relative: 5 %
HEMATOCRIT: 26.6 % — AB (ref 39.0–52.0)
Hemoglobin: 8.1 g/dL — ABNORMAL LOW (ref 13.0–17.0)
LYMPHS ABS: 1.8 10*3/uL (ref 0.7–4.0)
LYMPHS PCT: 22 %
MCH: 27.8 pg (ref 26.0–34.0)
MCHC: 30.5 g/dL (ref 30.0–36.0)
MCV: 91.4 fL (ref 78.0–100.0)
MONOS PCT: 7 %
Monocytes Absolute: 0.5 10*3/uL (ref 0.1–1.0)
NEUTROS PCT: 66 %
Neutro Abs: 5.4 10*3/uL (ref 1.7–7.7)
Platelets: 227 10*3/uL (ref 150–400)
RBC: 2.91 MIL/uL — AB (ref 4.22–5.81)
RDW: 18.3 % — ABNORMAL HIGH (ref 11.5–15.5)
WBC: 8.1 10*3/uL (ref 4.0–10.5)

## 2016-08-14 MED ORDER — LIDOCAINE HCL 2 % EX GEL
1.0000 "application " | Freq: Once | CUTANEOUS | Status: AC
  Administered 2016-08-14: 1 via URETHRAL
  Filled 2016-08-14: qty 11

## 2016-08-14 NOTE — ED Notes (Signed)
Bed: WA09 Expected date: 08/14/16 Expected time: 1:09 PM Means of arrival: Ambulance Comments: Foley problems

## 2016-08-14 NOTE — Consult Note (Signed)
Urology Consult   Physician requesting consult: Dr. Laverta Baltimore  Reason for consult: Hematuria and urethral bleeding  History of Present Illness: Lance [name] is a 76 y.o.  Gentleman who is followed by Dr. Tresa Moore presents to the emergency department tonight after developing urethral bleeding.  He recently was seen by home health nursing to change his catheter monthly and his catheter was changed based on the fact that his urine was cloudy.  When his catheter was replaced, he developed significant pain and subsequent bleeding from his urethra likely consistent with catheter trauma due to the balloon being blown up in the prostatic urethra.  A new catheter was then placed but the patient has continued to have very bloody urine including significant bleeding around the catheter.  He was evaluated by home health earlier today and due to his pericatheter urethral bleeding and poor urine output, his catheter was removed.  When he continued to have bleeding, he was recommended to go to the emergency department.  He denies a history of voiding or storage urinary symptoms, hematuria, UTIs, STDs, urolithiasis, GU malignancy/trauma/surgery.  Past Medical History:  Diagnosis Date  . Acute CHF (congestive heart failure) ([place]) 02/14/2016  . Acute osteomyelitis, pelvis, right ([place])   . Acute renal failure ([place])   . Anemia in chronic renal disease   . Blood transfusion   . Cardiomyopathy ([place]) 02/17/2016  . Chronic spastic paraplegia (HCC)    secondary to MS  . CKD (chronic kidney disease), stage III   . Decreased sensation of lower extremity    due to MS  . Decubitus ulcer of ischium   . Decubitus ulcer of trochanter, stage 4 ([place]) 06/14/2015  . History of MRSA infection    urine  . History of recurrent UTIs   . Hypertension   . MS (multiple sclerosis) ([place])   . Neuralgia neuritis, sciatic nerve    MS for 30 years  . Neurogenic bladder   . PONV (postoperative nausea and vomiting)   .  Self-catheterizes urinary bladder   . Type 2 diabetes mellitus ([place])     Past Surgical History:  Procedure Laterality Date  . APPLICATION OF A-CELL OF EXTREMITY Left 09/05/2014   Procedure: PLACEMENT OF ACELL AND VAC;  Surgeon: Theodoro Kos, DO;  Location: [place];  Service: Plastics;  Laterality: Left;  . BASCILIC VEIN TRANSPOSITION Right 03/24/2016   Procedure: RADIAL- CEPHALIC FISTULA CREATION RIGHT ARM;  Surgeon: Conrad Eastville, MD;  Location: [place];  Service: Vascular;  Laterality: Right;  . EXTRACORPOREAL SHOCK WAVE LITHOTRIPSY  03-23-2011  . FINGER SURGERY  2004  . INCISION AND DRAINAGE OF WOUND Left 09/05/2014   Procedure: IRRIGATION AND DEBRIDEMENT OF LEFT ISCHIUM WOUND WITH ;  Surgeon: Theodoro Kos, DO;  Location: [place];  Service: Plastics;  Laterality: Left;  . INSERTION OF DIALYSIS CATHETER N/A 02/27/2016   Procedure: INSERTION OF DIALYSIS CATHETER-RIGHT INTERNAL JUGULA PLACEMENT;  Surgeon: Conrad Denhoff, MD;  Location: [place];  Service: Vascular;  Laterality: N/A;  . TONSILLECTOMY    . WOUND DEBRIDEMENT     [facility]     Medications:  Home meds:  Current Meds  Medication Sig  . acetaminophen (TYLENOL) 325 MG tablet Take 650 mg by mouth every 6 (six) hours as needed for moderate pain or fever.   . Amino Acids-Protein Hydrolys (FEEDING SUPPLEMENT, PRO-STAT SUGAR FREE 64,) LIQD Take 66ml by mouth twice daily for wound healing  . b complex-vitamin c-folic acid (NEPHRO-VITE) 0.8 MG TABS tablet Take 1  tablet by mouth every morning.  . baclofen (LIORESAL) 10 MG tablet Take 1 tablet (10 mg total) by mouth at bedtime as needed for muscle spasms.  . carvedilol (COREG) 3.125 MG tablet Take 1 tablet (3.125 mg total) by mouth 2 (two) times daily with a meal.  . insulin aspart (NOVOLOG FLEXPEN) 100 UNIT/ML FlexPen Inject 0-9 Units into the skin 3 (three) times daily with meals. Per sliding scale  . insulin glargine (LANTUS) 100 unit/mL SOPN Inject 0.12 mLs (12 Units total) into the skin  daily. Take in the morning.  . lidocaine-prilocaine (EMLA) cream Apply 1 application topically as needed.  . Melatonin 3 MG TABS Take 3 mg by mouth at bedtime.  Marland Kitchen oxybutynin (DITROPAN) 5 MG tablet Take 1 tablet (5 mg total) by mouth 2 (two) times daily.  . sertraline (ZOLOFT) 50 MG tablet Take 1 tablet (50 mg total) by mouth at bedtime. (Patient taking differently: Take 25 mg by mouth at bedtime. )    Scheduled Meds: Continuous Infusions: PRN Meds:.  Allergies: No Known Allergies  Family History  Problem Relation Age of Onset  . Stroke Father   . Diabetes Father   . Diabetes Paternal Grandmother   . Stroke Paternal Grandfather   . Heart disease Neg Hx   . Cancer Neg Hx     Social History:  reports that he quit smoking about 33 years ago. His smoking use included Cigars. He has never used smokeless tobacco. He reports that he does not drink alcohol or use drugs.  ROS: A complete review of systems was performed.  All systems are negative except for pertinent findings as noted.  Physical Exam:  Vital signs in last 24 hours: Temp:  [97.8 F (36.6 C)-98 F (36.7 C)] 98 F (36.7 C) (04/13 1825) Pulse Rate:  [72-74] 72 (04/13 1825) Resp:  [18-20] 18 (04/13 1825) BP: (109-130)/(72-75) 109/72 (04/13 1825) SpO2:  [97 %-100 %] 100 % (04/13 1825) Weight:  [73.5 kg (162 lb)] 73.5 kg (162 lb) (04/13 1344) Constitutional:  Alert and oriented, No acute distress Cardiovascular: Regular rate and rhythm, No JVD Respiratory: Normal respiratory effort, Lungs clear bilaterally GI: Abdomen is soft, nontender, nondistended, no abdominal masses Genitourinary: No CVAT. He does have significant urethral erosion with blood seen both around the catheter.  He has grossly bloody urine in the catheter that was placed by the emergency department today. Lymphatic: No lymphadenopathy Neurologic: Grossly intact, no focal deficits Psychiatric: Normal mood and affect  Laboratory Data:   Recent Labs   08/14/16 1513  WBC 8.1  HGB 8.1*  HCT 26.6*  PLT 227     Recent Labs  08/14/16 1513  NA 141  K 3.5  CL 101  GLUCOSE 69  BUN 43*  CALCIUM 9.0  CREATININE 2.45*     Results for orders placed or performed during the hospital encounter of 08/14/16 (from the past 24 hour(s))  Basic metabolic panel     Status: Abnormal   Collection Time: 08/14/16  3:13 PM  Result Value Ref Range   Sodium 141 135 - 145 mmol/L   Potassium 3.5 3.5 - 5.1 mmol/L   Chloride 101 101 - 111 mmol/L   CO2 30 22 - 32 mmol/L   Glucose, Bld 69 65 - 99 mg/dL   BUN 43 (H) 6 - 20 mg/dL   Creatinine, Ser 2.45 (H) 0.61 - 1.24 mg/dL   Calcium 9.0 8.9 - 10.3 mg/dL   GFR calc non Af Amer 24 (L) >60 mL/min  GFR calc Af Amer 28 (L) >60 mL/min   Anion gap 10 5 - 15  CBC with Differential     Status: Abnormal   Collection Time: 08/14/16  3:13 PM  Result Value Ref Range   WBC 8.1 4.0 - 10.5 K/uL   RBC 2.91 (L) 4.22 - 5.81 MIL/uL   Hemoglobin 8.1 (L) 13.0 - 17.0 g/dL   HCT 26.6 (L) 39.0 - 52.0 %   MCV 91.4 78.0 - 100.0 fL   MCH 27.8 26.0 - 34.0 pg   MCHC 30.5 30.0 - 36.0 g/dL   RDW 18.3 (H) 11.5 - 15.5 %   Platelets 227 150 - 400 K/uL   Neutrophils Relative % 66 %   Neutro Abs 5.4 1.7 - 7.7 K/uL   Lymphocytes Relative 22 %   Lymphs Abs 1.8 0.7 - 4.0 K/uL   Monocytes Relative 7 %   Monocytes Absolute 0.5 0.1 - 1.0 K/uL   Eosinophils Relative 5 %   Eosinophils Absolute 0.4 0.0 - 0.7 K/uL   Basophils Relative 0 %   Basophils Absolute 0.0 0.0 - 0.1 K/uL  Protime-INR     Status: None   Collection Time: 08/14/16  3:13 PM  Result Value Ref Range   Prothrombin Time 14.0 11.4 - 15.2 seconds   INR 1.07    No results found for this or any previous visit (from the past 240 hour(s)).  Renal Function:  Recent Labs  08/14/16 1513  CREATININE 2.45*   Estimated Creatinine Clearance: 27.1 mL/min (A) (by C-G formula based on SCr of 2.45 mg/dL (H)).  Procedures:  I irrigated a moderate amount of clot from  his bladder by hand.  I then inflated this balloon to 30 cc and put him on a slight amount of traction.  Further irrigation revealed almost clear urine.  This was consistent with urethral/prostatic bleeding.  Impression/Recommendation Urethral bleeding secondary to Foley catheter trauma: This catheter should be left indwelling and placed on light traction overnight.  He has been formed to taken off traction in the morning if his urine remains clear.  I have recommended that he have his catheter changed in our office next month.  He likely can then resume home health catheter changes at that time.  Serenah Mill,LES 08/14/2016, 8:41 PM    Pryor Curia MD  CC: Dr. Laverta Baltimore

## 2016-08-14 NOTE — ED Notes (Signed)
Bladder scan 230 mL

## 2016-08-14 NOTE — ED Triage Notes (Signed)
EMS reports pt from home, a week ago cath replaced, since has been having clots and bleeding, home health removed foley today around 1145. Pt continues to bleed with clots around meatus. No cath replaced.

## 2016-08-14 NOTE — ED Provider Notes (Signed)
Emergency Department Provider Note   I have reviewed the triage vital signs and the nursing notes.   HISTORY  Chief Complaint Foley Problems   HPI Lance [name] is a 76 y.o. male with PMH of CHF, ESRD, decubitus ulcer with wound vac, and chronic spastic paraplegia 2/2 MS and neurogenic bladder with chronic indwelling foley catheter resents to the emergency department for evaluation of bleeding from his Foley catheter today. Patient states that approximately a week ago home health nurse attempted to exchange his Foley catheter. He had severe pain during the procedure with no confirmed successful placement. He developed bleeding into the Foley but was not passing urine. He waited until the next day when he was still not passing urine and presented to the emergency department. Foley catheter was successfully exchanged at that time but he is continued to have bleeding into his Foley catheter bag since then. He is been to dialysis this week where his hemoglobin was found to be down trending. He was given infusions during dialysis to try and increase his hemoglobin. Today he noticed worsening blood in his Foley catheter bag and also noticed bleeding around the urethral meatus, which is new. Home health came out to evaluate the Foley catheter and removed it prior to ED transport. He denies any fever, chills, lower abdominal pain/fullness.    Past Medical History:  Diagnosis Date  . Acute CHF (congestive heart failure) ([place]) 02/14/2016  . Acute osteomyelitis, pelvis, right ([place])   . Acute renal failure ([place])   . Anemia in chronic renal disease   . Blood transfusion   . Cardiomyopathy ([place]) 02/17/2016  . Chronic spastic paraplegia (H[facility])    secondary to MS  . CKD (chronic kidney disease), stage III   . Decreased sensation of lower extremity    due to MS  . Decubitus ulcer of ischium   . Decubitus ulcer of trochanter, stage 4 ([place]) 06/14/2015  . History of MRSA infection    urine  .  History of recurrent UTIs   . Hypertension   . MS (multiple sclerosis) ([place])   . Neuralgia neuritis, sciatic nerve    MS for 30 years  . Neurogenic bladder   . PONV (postoperative nausea and vomiting)   . Self-catheterizes urinary bladder   . Type 2 diabetes mellitus [facility])     Patient Active Problem List   Diagnosis Date Noted  . Malnutrition of moderate degree 07/03/2016  . Osteomyelitis ([place]) 07/02/2016  . Symptomatic anemia 07/02/2016  . SIRS (systemic inflammatory response syndrome) ([place]) 05/09/2016  . Melena 05/09/2016  . Hematemesis 05/09/2016  . Non-intractable vomiting with nausea 05/07/2016  . Altered mental status 04/21/2016  . Acute respiratory failure ([place])   . ESRD on dialysis ([place])   . NICM (nonischemic cardiomyopathy) ([place])   . Impaired functional mobility, balance, and endurance 02/17/2016  . Generalized anxiety disorder 02/17/2016  . Cardiomyopathy ([place]) 02/17/2016  . Hypoxia   . Acute pulmonary edema (HCC)   . Elevated troponin 02/14/2016  . Severe protein-calorie malnutrition ([place]) 02/14/2016  . Acute systolic heart failure ([place]) 02/14/2016  . Depression 02/13/2016  . DM (diabetes mellitus) type II controlled with renal manifestation ([place]) 09/06/2015  . Dyslipidemia associated with type 2 diabetes mellitus ([place]) 09/06/2015  . Recurrent UTI (urinary tract infection) 09/06/2015  . Decubitus ulcer of right ischial area 06/14/2015  . Decubitus ulcer of trochanter, stage 4 ([place]) 06/14/2015  . Left perineal ischial pressure ulcer   . Acute osteomyelitis, pelvis, right ([place])   .  Essential hypertension, benign 08/03/2013  . Cellulitis of foot 08/02/2013  . Spastic quadriplegia ([place]) 07/10/2011  . Multiple sclerosis ([place]) 03/30/2011  . Sacral decubitus ulcer 03/30/2011  . Progressive anemia 03/30/2011    Past Surgical History:  Procedure Laterality Date  . APPLICATION OF A-CELL OF EXTREMITY Left 09/05/2014   Procedure: PLACEMENT OF ACELL AND VAC;  Surgeon:  Theodoro Kos, DO;  Location: [place];  Service: Plastics;  Laterality: Left;  . BASCILIC VEIN TRANSPOSITION Right 03/24/2016   Procedure: RADIAL- CEPHALIC FISTULA CREATION RIGHT ARM;  Surgeon: Conrad Aubrey, MD;  Location: [place];  Service: Vascular;  Laterality: Right;  . EXTRACORPOREAL SHOCK WAVE LITHOTRIPSY  03-23-2011  . FINGER SURGERY  2004  . INCISION AND DRAINAGE OF WOUND Left 09/05/2014   Procedure: IRRIGATION AND DEBRIDEMENT OF LEFT ISCHIUM WOUND WITH ;  Surgeon: Theodoro Kos, DO;  Location: [place] [place];  Service: Plastics;  Laterality: Left;  . INSERTION OF DIALYSIS CATHETER N/A 02/27/2016   Procedure: INSERTION OF DIALYSIS CATHETER-RIGHT INTERNAL JUGULA PLACEMENT;  Surgeon: Conrad Stevens, MD;  Location: [place];  Service: Vascular;  Laterality: N/A;  . TONSILLECTOMY    . WOUND DEBRIDEMENT     10/2/17[facility]     Current Outpatient Rx  . Order #: [id number] Class: Historical Med  . Order #: [id number] Class: Historical Med  . Order #: [id number] Class: Normal  . Order #: [id number] Class: Normal  . Order #: [id number] Class: Historical Med  . Order #: [id number] Class: Normal  . Order #: [id number] Class: Normal  . Order #: [id number] Class: Historical Med  . Order #: [id number] Class: Phone In  . Order #: [id number] Class: Historical Med  . Order #: [id number] Class: Phone In  . Order #: [id number] Class: Phone In  . Order #: [id number] Class: Normal  . Order #: [id number] Class: Normal  . Order #: [id number] Class: Historical Med  . Order #: [id number] Class: Historical Med  . Order #: [id number] Class: Historical Med  . Order #: [id number] Class: Normal  . Order #: [id number] Class: No Print  . Order #: [id number] Class: Normal  . Order #: [id number] Class: Historical Med  . Order #: [id number] Class: No Print  . Order #: [id number] Class: Normal  . Order #: [id number] Class: Print  . Order #: [id number] Class: No Print    Allergies Patient has no known allergies.  Family History  Problem Relation Age of Onset  . Stroke  Father   . Diabetes Father   . Diabetes Paternal Grandmother   . Stroke Paternal Grandfather   . Heart disease Neg Hx   . Cancer Neg Hx     Social History Social History  Substance Use Topics  . Smoking status: Former Smoker    Types: Cigars    Quit date: 05/06/1983  . Smokeless tobacco: Never Used  . Alcohol use No     Comment: pt has not had any since Dec 2017    Review of Systems  Constitutional: No fever/chills Eyes: No visual changes. ENT: No sore throat. Cardiovascular: Denies chest pain. Respiratory: Denies shortness of breath. Gastrointestinal: No abdominal pain.  No nausea, no vomiting.  No diarrhea.  No constipation. Genitourinary: Negative for dysuria. Positive hematuria and blood from the urethral meatus.  Musculoskeletal: Negative for back pain. Skin: Negative for rash. Neurological: Negative for headaches, focal weakness or numbness.  10-point ROS otherwise negative.  ____________________________________________   PHYSICAL EXAM:  VITAL SIGNS: ED Triage Vitals  Enc Vitals Group     BP 08/14/16 1344 130/75     Pulse Rate 08/14/16 1344 74     Resp 08/14/16  1344 20     Temp 08/14/16 1344 97.8 F (36.6 C)     Temp Source 08/14/16 1344 Oral     SpO2 08/14/16 1344 97 %     Weight 08/14/16 1344 162 lb (73.5 kg)     Height 08/14/16 1344 6\' 4"  (1.93 m)     Pain Score 08/14/16 1322 4   Constitutional: Alert and oriented. Well appearing and in no acute distress. Eyes: Conjunctivae are normal. Head: Atraumatic. Nose: No congestion/rhinnorhea. Mouth/Throat: Mucous membranes are moist.  Oropharynx non-erythematous. Neck: No stridor. Cardiovascular: Normal rate, regular rhythm. Good peripheral circulation. Grossly normal heart sounds.   Respiratory: Normal respiratory effort.  No retractions. Lungs CTAB. Gastrointestinal: Soft and nontender. No distention.  Genitourinary: Clot and BRB on the chux pad. No active bleeding from the urethra at this time. Mild  lower abdomen tenderness but no appreciable bladder fullness.  Musculoskeletal: No lower extremity tenderness nor edema. No gross deformities of extremities. Wound vac to the patient's sacral decubitus ulcer with normal output.  Neurologic:  Normal speech and language. No gross focal neurologic deficits are appreciated.  Skin:  Skin is warm, dry and intact. No rash noted. Psychiatric: Mood and affect are normal. Speech and behavior are normal.  ____________________________________________   LABS (all labs ordered are listed, but only abnormal results are displayed)  Labs Reviewed  BASIC METABOLIC PANEL - Abnormal; Notable for the following:       Result Value   BUN 43 (*)    Creatinine, Ser 2.45 (*)    GFR calc non Af Amer 24 (*)    GFR calc Af Amer 28 (*)    All other components within normal limits  CBC WITH DIFFERENTIAL/PLATELET - Abnormal; Notable for the following:    RBC 2.91 (*)    Hemoglobin 8.1 (*)    HCT 26.6 (*)    RDW 18.3 (*)    All other components within normal limits  PROTIME-INR   ____________________________________________   PROCEDURES  Procedure(s) performed:   Procedures  None ____________________________________________   INITIAL IMPRESSION / ASSESSMENT AND PLAN / ED COURSE  Pertinent labs & imaging results that were available during my care of the patient were reviewed by me and considered in my medical decision making (see chart for details).  Patient resents to the emergency department for evaluation of hematuria and bleeding and clots from his urethra. Symptoms began a little over a week ago with a traumatic Foley placement and have persisted. Hemoglobin also down trending at dialysis. Patient is awake and alert with no infection symptoms. No active bleeding from the urethral meatus. Plan for placement of Foley catheter here in the emergency department as his Foley was removed prior to ED transport. Prior placement in the ED was without  complication. We will irrigate the bladder . Also check baseline blood work and hemoglobin to decide if the patient will require additional treatment.   04:15 PM In cleaning the area for foley placement there was some BRB noted to be coming from the urethra at this time. Will discuss with Urology prior to proceeding further.   Discussed with Urology. Nursing completed foley. We are flushing the catheter with some mild clearing of blood. Urology came to bedside for evaluation and additional flushing. Some traction applied to the foley. Patient discharged home with plan to come to the Urology clinic for his next foley change. Patient's anemia is being addressed at HD.   At this time, I do not feel there is any  life-threatening condition present. I have reviewed and discussed all results (EKG, imaging, lab, urine as appropriate), exam findings with patient. I have reviewed nursing notes and appropriate previous records.  I feel the patient is safe to be discharged home without further emergent workup. Discussed usual and customary return precautions. Patient and family (if present) verbalize understanding and are comfortable with this plan.  Patient will follow-up with their primary care provider. If they do not have a primary care provider, information for follow-up has been provided to them. All questions have been answered.  ____________________________________________  FINAL CLINICAL IMPRESSION(S) / ED DIAGNOSES  Final diagnoses:  Problem with Foley catheter, initial encounter ([place])  Gross hematuria     MEDICATIONS GIVEN DURING THIS VISIT:  Medications  lidocaine (XYLOCAINE) 2 % jelly 1 application (1 application Urethral Given 08/14/16 1720)     NEW OUTPATIENT MEDICATIONS STARTED DURING THIS VISIT:  None   Note:  This document was prepared using Dragon voice recognition software and may include unintentional dictation errors.  Nanda Quinton, MD Emergency Medicine   Margette Fast,  MD 08/17/16 [phone]

## 2016-08-14 NOTE — ED Triage Notes (Signed)
Pt has wound vac to decub on bottom.

## 2016-08-14 NOTE — ED Notes (Signed)
[facility] here to transport pt back to his residence. 

## 2016-08-14 NOTE — ED Notes (Signed)
Irrigated foley 1000 cc ns.  All returned.  Bloody remains with lightening in procedure.  Clots noted in beginning.  Md made aware.

## 2016-08-14 NOTE — Discharge Instructions (Signed)
You were seen in the ED today with blood in the urine that may have been due to a foley catheter problem. We replaced the foley and the blood seems to be clearing. Keep the area taped well and follow up with your Urology team for the next foley catheter change.   You may have some additional bleeding but do not have home health remove the catheter.   Return if the foley stops draining urine, you develop fever/chills, or you have severe lower abdominal pain.

## 2016-08-14 NOTE — ED Notes (Signed)
Foley cath placed.

## 2016-08-14 NOTE — ED Notes (Signed)
[facility] was called for pt's transportation back to his home/residence.

## 2016-08-15 DIAGNOSIS — E1122 Type 2 diabetes mellitus with diabetic chronic kidney disease: Secondary | ICD-10-CM | POA: Diagnosis not present

## 2016-08-15 DIAGNOSIS — N2581 Secondary hyperparathyroidism of renal origin: Secondary | ICD-10-CM | POA: Diagnosis not present

## 2016-08-15 DIAGNOSIS — N186 End stage renal disease: Secondary | ICD-10-CM | POA: Diagnosis not present

## 2016-08-15 DIAGNOSIS — D509 Iron deficiency anemia, unspecified: Secondary | ICD-10-CM | POA: Diagnosis not present

## 2016-08-15 DIAGNOSIS — D631 Anemia in chronic kidney disease: Secondary | ICD-10-CM | POA: Diagnosis not present

## 2016-08-17 DIAGNOSIS — N186 End stage renal disease: Secondary | ICD-10-CM | POA: Diagnosis not present

## 2016-08-17 DIAGNOSIS — G35 Multiple sclerosis: Secondary | ICD-10-CM | POA: Diagnosis not present

## 2016-08-17 DIAGNOSIS — L89222 Pressure ulcer of left hip, stage 2: Secondary | ICD-10-CM | POA: Diagnosis not present

## 2016-08-17 DIAGNOSIS — I120 Hypertensive chronic kidney disease with stage 5 chronic kidney disease or end stage renal disease: Secondary | ICD-10-CM | POA: Diagnosis not present

## 2016-08-17 DIAGNOSIS — L89314 Pressure ulcer of right buttock, stage 4: Secondary | ICD-10-CM | POA: Diagnosis not present

## 2016-08-17 DIAGNOSIS — E1122 Type 2 diabetes mellitus with diabetic chronic kidney disease: Secondary | ICD-10-CM | POA: Diagnosis not present

## 2016-08-18 DIAGNOSIS — N2581 Secondary hyperparathyroidism of renal origin: Secondary | ICD-10-CM | POA: Diagnosis not present

## 2016-08-18 DIAGNOSIS — D631 Anemia in chronic kidney disease: Secondary | ICD-10-CM | POA: Diagnosis not present

## 2016-08-18 DIAGNOSIS — D509 Iron deficiency anemia, unspecified: Secondary | ICD-10-CM | POA: Diagnosis not present

## 2016-08-18 DIAGNOSIS — N186 End stage renal disease: Secondary | ICD-10-CM | POA: Diagnosis not present

## 2016-08-18 DIAGNOSIS — E1122 Type 2 diabetes mellitus with diabetic chronic kidney disease: Secondary | ICD-10-CM | POA: Diagnosis not present

## 2016-08-19 DIAGNOSIS — I120 Hypertensive chronic kidney disease with stage 5 chronic kidney disease or end stage renal disease: Secondary | ICD-10-CM | POA: Diagnosis not present

## 2016-08-19 DIAGNOSIS — L89222 Pressure ulcer of left hip, stage 2: Secondary | ICD-10-CM | POA: Diagnosis not present

## 2016-08-19 DIAGNOSIS — N186 End stage renal disease: Secondary | ICD-10-CM | POA: Diagnosis not present

## 2016-08-19 DIAGNOSIS — L89314 Pressure ulcer of right buttock, stage 4: Secondary | ICD-10-CM | POA: Diagnosis not present

## 2016-08-19 DIAGNOSIS — G35 Multiple sclerosis: Secondary | ICD-10-CM | POA: Diagnosis not present

## 2016-08-19 DIAGNOSIS — E1122 Type 2 diabetes mellitus with diabetic chronic kidney disease: Secondary | ICD-10-CM | POA: Diagnosis not present

## 2016-08-20 DIAGNOSIS — D509 Iron deficiency anemia, unspecified: Secondary | ICD-10-CM | POA: Diagnosis not present

## 2016-08-20 DIAGNOSIS — D631 Anemia in chronic kidney disease: Secondary | ICD-10-CM | POA: Diagnosis not present

## 2016-08-20 DIAGNOSIS — N2581 Secondary hyperparathyroidism of renal origin: Secondary | ICD-10-CM | POA: Diagnosis not present

## 2016-08-20 DIAGNOSIS — N186 End stage renal disease: Secondary | ICD-10-CM | POA: Diagnosis not present

## 2016-08-20 DIAGNOSIS — E1122 Type 2 diabetes mellitus with diabetic chronic kidney disease: Secondary | ICD-10-CM | POA: Diagnosis not present

## 2016-08-21 DIAGNOSIS — N186 End stage renal disease: Secondary | ICD-10-CM | POA: Diagnosis not present

## 2016-08-21 DIAGNOSIS — G35 Multiple sclerosis: Secondary | ICD-10-CM | POA: Diagnosis not present

## 2016-08-21 DIAGNOSIS — I120 Hypertensive chronic kidney disease with stage 5 chronic kidney disease or end stage renal disease: Secondary | ICD-10-CM | POA: Diagnosis not present

## 2016-08-21 DIAGNOSIS — L89314 Pressure ulcer of right buttock, stage 4: Secondary | ICD-10-CM | POA: Diagnosis present

## 2016-08-21 DIAGNOSIS — E1040 Type 1 diabetes mellitus with diabetic neuropathy, unspecified: Secondary | ICD-10-CM | POA: Diagnosis not present

## 2016-08-21 DIAGNOSIS — L89322 Pressure ulcer of left buttock, stage 2: Secondary | ICD-10-CM | POA: Diagnosis not present

## 2016-08-21 DIAGNOSIS — E1022 Type 1 diabetes mellitus with diabetic chronic kidney disease: Secondary | ICD-10-CM | POA: Diagnosis not present

## 2016-08-21 DIAGNOSIS — Z794 Long term (current) use of insulin: Secondary | ICD-10-CM | POA: Diagnosis not present

## 2016-08-21 DIAGNOSIS — G8220 Paraplegia, unspecified: Secondary | ICD-10-CM | POA: Diagnosis not present

## 2016-08-21 DIAGNOSIS — E43 Unspecified severe protein-calorie malnutrition: Secondary | ICD-10-CM | POA: Diagnosis not present

## 2016-08-21 DIAGNOSIS — Z8614 Personal history of Methicillin resistant Staphylococcus aureus infection: Secondary | ICD-10-CM | POA: Diagnosis not present

## 2016-08-21 DIAGNOSIS — I429 Cardiomyopathy, unspecified: Secondary | ICD-10-CM | POA: Diagnosis not present

## 2016-08-21 DIAGNOSIS — Z992 Dependence on renal dialysis: Secondary | ICD-10-CM | POA: Diagnosis not present

## 2016-08-22 DIAGNOSIS — N186 End stage renal disease: Secondary | ICD-10-CM | POA: Diagnosis not present

## 2016-08-22 DIAGNOSIS — D509 Iron deficiency anemia, unspecified: Secondary | ICD-10-CM | POA: Diagnosis not present

## 2016-08-22 DIAGNOSIS — D631 Anemia in chronic kidney disease: Secondary | ICD-10-CM | POA: Diagnosis not present

## 2016-08-22 DIAGNOSIS — N2581 Secondary hyperparathyroidism of renal origin: Secondary | ICD-10-CM | POA: Diagnosis not present

## 2016-08-22 DIAGNOSIS — E1122 Type 2 diabetes mellitus with diabetic chronic kidney disease: Secondary | ICD-10-CM | POA: Diagnosis not present

## 2016-08-23 DIAGNOSIS — L89222 Pressure ulcer of left hip, stage 2: Secondary | ICD-10-CM | POA: Diagnosis not present

## 2016-08-23 DIAGNOSIS — G35 Multiple sclerosis: Secondary | ICD-10-CM | POA: Diagnosis not present

## 2016-08-23 DIAGNOSIS — L89314 Pressure ulcer of right buttock, stage 4: Secondary | ICD-10-CM | POA: Diagnosis not present

## 2016-08-23 DIAGNOSIS — E1122 Type 2 diabetes mellitus with diabetic chronic kidney disease: Secondary | ICD-10-CM | POA: Diagnosis not present

## 2016-08-23 DIAGNOSIS — I120 Hypertensive chronic kidney disease with stage 5 chronic kidney disease or end stage renal disease: Secondary | ICD-10-CM | POA: Diagnosis not present

## 2016-08-23 DIAGNOSIS — N186 End stage renal disease: Secondary | ICD-10-CM | POA: Diagnosis not present

## 2016-08-24 DIAGNOSIS — N186 End stage renal disease: Secondary | ICD-10-CM | POA: Diagnosis not present

## 2016-08-24 DIAGNOSIS — I120 Hypertensive chronic kidney disease with stage 5 chronic kidney disease or end stage renal disease: Secondary | ICD-10-CM | POA: Diagnosis not present

## 2016-08-24 DIAGNOSIS — L89314 Pressure ulcer of right buttock, stage 4: Secondary | ICD-10-CM | POA: Diagnosis not present

## 2016-08-24 DIAGNOSIS — L89222 Pressure ulcer of left hip, stage 2: Secondary | ICD-10-CM | POA: Diagnosis not present

## 2016-08-24 DIAGNOSIS — E1122 Type 2 diabetes mellitus with diabetic chronic kidney disease: Secondary | ICD-10-CM | POA: Diagnosis not present

## 2016-08-24 DIAGNOSIS — G35 Multiple sclerosis: Secondary | ICD-10-CM | POA: Diagnosis not present

## 2016-08-25 DIAGNOSIS — N186 End stage renal disease: Secondary | ICD-10-CM | POA: Diagnosis not present

## 2016-08-25 DIAGNOSIS — D631 Anemia in chronic kidney disease: Secondary | ICD-10-CM | POA: Diagnosis not present

## 2016-08-25 DIAGNOSIS — N2581 Secondary hyperparathyroidism of renal origin: Secondary | ICD-10-CM | POA: Diagnosis not present

## 2016-08-25 DIAGNOSIS — D509 Iron deficiency anemia, unspecified: Secondary | ICD-10-CM | POA: Diagnosis not present

## 2016-08-25 DIAGNOSIS — E1122 Type 2 diabetes mellitus with diabetic chronic kidney disease: Secondary | ICD-10-CM | POA: Diagnosis not present

## 2016-08-26 DIAGNOSIS — I120 Hypertensive chronic kidney disease with stage 5 chronic kidney disease or end stage renal disease: Secondary | ICD-10-CM | POA: Diagnosis not present

## 2016-08-26 DIAGNOSIS — G35 Multiple sclerosis: Secondary | ICD-10-CM | POA: Diagnosis not present

## 2016-08-26 DIAGNOSIS — E1122 Type 2 diabetes mellitus with diabetic chronic kidney disease: Secondary | ICD-10-CM | POA: Diagnosis not present

## 2016-08-26 DIAGNOSIS — N186 End stage renal disease: Secondary | ICD-10-CM | POA: Diagnosis not present

## 2016-08-26 DIAGNOSIS — L89222 Pressure ulcer of left hip, stage 2: Secondary | ICD-10-CM | POA: Diagnosis not present

## 2016-08-26 DIAGNOSIS — L89314 Pressure ulcer of right buttock, stage 4: Secondary | ICD-10-CM | POA: Diagnosis not present

## 2016-08-27 DIAGNOSIS — N2581 Secondary hyperparathyroidism of renal origin: Secondary | ICD-10-CM | POA: Diagnosis not present

## 2016-08-27 DIAGNOSIS — N186 End stage renal disease: Secondary | ICD-10-CM | POA: Diagnosis not present

## 2016-08-27 DIAGNOSIS — E1122 Type 2 diabetes mellitus with diabetic chronic kidney disease: Secondary | ICD-10-CM | POA: Diagnosis not present

## 2016-08-27 DIAGNOSIS — D631 Anemia in chronic kidney disease: Secondary | ICD-10-CM | POA: Diagnosis not present

## 2016-08-27 DIAGNOSIS — D509 Iron deficiency anemia, unspecified: Secondary | ICD-10-CM | POA: Diagnosis not present

## 2016-08-28 DIAGNOSIS — L89314 Pressure ulcer of right buttock, stage 4: Secondary | ICD-10-CM | POA: Diagnosis not present

## 2016-08-28 DIAGNOSIS — L89222 Pressure ulcer of left hip, stage 2: Secondary | ICD-10-CM | POA: Diagnosis not present

## 2016-08-28 DIAGNOSIS — G35 Multiple sclerosis: Secondary | ICD-10-CM | POA: Diagnosis not present

## 2016-08-28 DIAGNOSIS — E1122 Type 2 diabetes mellitus with diabetic chronic kidney disease: Secondary | ICD-10-CM | POA: Diagnosis not present

## 2016-08-28 DIAGNOSIS — N186 End stage renal disease: Secondary | ICD-10-CM | POA: Diagnosis not present

## 2016-08-28 DIAGNOSIS — I120 Hypertensive chronic kidney disease with stage 5 chronic kidney disease or end stage renal disease: Secondary | ICD-10-CM | POA: Diagnosis not present

## 2016-08-29 DIAGNOSIS — E1122 Type 2 diabetes mellitus with diabetic chronic kidney disease: Secondary | ICD-10-CM | POA: Diagnosis not present

## 2016-08-29 DIAGNOSIS — D631 Anemia in chronic kidney disease: Secondary | ICD-10-CM | POA: Diagnosis not present

## 2016-08-29 DIAGNOSIS — D509 Iron deficiency anemia, unspecified: Secondary | ICD-10-CM | POA: Diagnosis not present

## 2016-08-29 DIAGNOSIS — N186 End stage renal disease: Secondary | ICD-10-CM | POA: Diagnosis not present

## 2016-08-29 DIAGNOSIS — N2581 Secondary hyperparathyroidism of renal origin: Secondary | ICD-10-CM | POA: Diagnosis not present

## 2016-08-31 DIAGNOSIS — G35 Multiple sclerosis: Secondary | ICD-10-CM | POA: Diagnosis not present

## 2016-08-31 DIAGNOSIS — I120 Hypertensive chronic kidney disease with stage 5 chronic kidney disease or end stage renal disease: Secondary | ICD-10-CM | POA: Diagnosis not present

## 2016-08-31 DIAGNOSIS — Z992 Dependence on renal dialysis: Secondary | ICD-10-CM | POA: Diagnosis not present

## 2016-08-31 DIAGNOSIS — L89222 Pressure ulcer of left hip, stage 2: Secondary | ICD-10-CM | POA: Diagnosis not present

## 2016-08-31 DIAGNOSIS — N186 End stage renal disease: Secondary | ICD-10-CM | POA: Diagnosis not present

## 2016-08-31 DIAGNOSIS — L89314 Pressure ulcer of right buttock, stage 4: Secondary | ICD-10-CM | POA: Diagnosis not present

## 2016-08-31 DIAGNOSIS — E1122 Type 2 diabetes mellitus with diabetic chronic kidney disease: Secondary | ICD-10-CM | POA: Diagnosis not present

## 2016-09-01 DIAGNOSIS — Z992 Dependence on renal dialysis: Secondary | ICD-10-CM | POA: Diagnosis not present

## 2016-09-01 DIAGNOSIS — N186 End stage renal disease: Secondary | ICD-10-CM | POA: Diagnosis not present

## 2016-09-01 DIAGNOSIS — N2581 Secondary hyperparathyroidism of renal origin: Secondary | ICD-10-CM | POA: Diagnosis not present

## 2016-09-01 DIAGNOSIS — E1122 Type 2 diabetes mellitus with diabetic chronic kidney disease: Secondary | ICD-10-CM | POA: Diagnosis not present

## 2016-09-01 DIAGNOSIS — D631 Anemia in chronic kidney disease: Secondary | ICD-10-CM | POA: Diagnosis not present

## 2016-09-02 ENCOUNTER — Ambulatory Visit: Payer: Medicare Other | Admitting: Internal Medicine

## 2016-09-02 DIAGNOSIS — L89222 Pressure ulcer of left hip, stage 2: Secondary | ICD-10-CM | POA: Diagnosis not present

## 2016-09-02 DIAGNOSIS — I120 Hypertensive chronic kidney disease with stage 5 chronic kidney disease or end stage renal disease: Secondary | ICD-10-CM | POA: Diagnosis not present

## 2016-09-02 DIAGNOSIS — E1122 Type 2 diabetes mellitus with diabetic chronic kidney disease: Secondary | ICD-10-CM | POA: Diagnosis not present

## 2016-09-02 DIAGNOSIS — L89314 Pressure ulcer of right buttock, stage 4: Secondary | ICD-10-CM | POA: Diagnosis not present

## 2016-09-02 DIAGNOSIS — N186 End stage renal disease: Secondary | ICD-10-CM | POA: Diagnosis not present

## 2016-09-02 DIAGNOSIS — G35 Multiple sclerosis: Secondary | ICD-10-CM | POA: Diagnosis not present

## 2016-09-03 DIAGNOSIS — Z992 Dependence on renal dialysis: Secondary | ICD-10-CM | POA: Diagnosis not present

## 2016-09-03 DIAGNOSIS — E1122 Type 2 diabetes mellitus with diabetic chronic kidney disease: Secondary | ICD-10-CM | POA: Diagnosis not present

## 2016-09-03 DIAGNOSIS — N2581 Secondary hyperparathyroidism of renal origin: Secondary | ICD-10-CM | POA: Diagnosis not present

## 2016-09-03 DIAGNOSIS — D631 Anemia in chronic kidney disease: Secondary | ICD-10-CM | POA: Diagnosis not present

## 2016-09-03 DIAGNOSIS — N186 End stage renal disease: Secondary | ICD-10-CM | POA: Diagnosis not present

## 2016-09-04 DIAGNOSIS — N186 End stage renal disease: Secondary | ICD-10-CM | POA: Diagnosis not present

## 2016-09-04 DIAGNOSIS — I120 Hypertensive chronic kidney disease with stage 5 chronic kidney disease or end stage renal disease: Secondary | ICD-10-CM | POA: Diagnosis not present

## 2016-09-04 DIAGNOSIS — E1122 Type 2 diabetes mellitus with diabetic chronic kidney disease: Secondary | ICD-10-CM | POA: Diagnosis not present

## 2016-09-04 DIAGNOSIS — L89222 Pressure ulcer of left hip, stage 2: Secondary | ICD-10-CM | POA: Diagnosis not present

## 2016-09-04 DIAGNOSIS — L89314 Pressure ulcer of right buttock, stage 4: Secondary | ICD-10-CM | POA: Diagnosis not present

## 2016-09-04 DIAGNOSIS — G35 Multiple sclerosis: Secondary | ICD-10-CM | POA: Diagnosis not present

## 2016-09-05 DIAGNOSIS — N186 End stage renal disease: Secondary | ICD-10-CM | POA: Diagnosis not present

## 2016-09-05 DIAGNOSIS — D631 Anemia in chronic kidney disease: Secondary | ICD-10-CM | POA: Diagnosis not present

## 2016-09-05 DIAGNOSIS — Z992 Dependence on renal dialysis: Secondary | ICD-10-CM | POA: Diagnosis not present

## 2016-09-05 DIAGNOSIS — N2581 Secondary hyperparathyroidism of renal origin: Secondary | ICD-10-CM | POA: Diagnosis not present

## 2016-09-05 DIAGNOSIS — E1122 Type 2 diabetes mellitus with diabetic chronic kidney disease: Secondary | ICD-10-CM | POA: Diagnosis not present

## 2016-09-07 DIAGNOSIS — I120 Hypertensive chronic kidney disease with stage 5 chronic kidney disease or end stage renal disease: Secondary | ICD-10-CM | POA: Diagnosis not present

## 2016-09-07 DIAGNOSIS — L89222 Pressure ulcer of left hip, stage 2: Secondary | ICD-10-CM | POA: Diagnosis not present

## 2016-09-07 DIAGNOSIS — N186 End stage renal disease: Secondary | ICD-10-CM | POA: Diagnosis not present

## 2016-09-07 DIAGNOSIS — L89314 Pressure ulcer of right buttock, stage 4: Secondary | ICD-10-CM | POA: Diagnosis not present

## 2016-09-07 DIAGNOSIS — E1122 Type 2 diabetes mellitus with diabetic chronic kidney disease: Secondary | ICD-10-CM | POA: Diagnosis not present

## 2016-09-07 DIAGNOSIS — G35 Multiple sclerosis: Secondary | ICD-10-CM | POA: Diagnosis not present

## 2016-09-08 DIAGNOSIS — D631 Anemia in chronic kidney disease: Secondary | ICD-10-CM | POA: Diagnosis not present

## 2016-09-08 DIAGNOSIS — N2581 Secondary hyperparathyroidism of renal origin: Secondary | ICD-10-CM | POA: Diagnosis not present

## 2016-09-08 DIAGNOSIS — E1122 Type 2 diabetes mellitus with diabetic chronic kidney disease: Secondary | ICD-10-CM | POA: Diagnosis not present

## 2016-09-08 DIAGNOSIS — N186 End stage renal disease: Secondary | ICD-10-CM | POA: Diagnosis not present

## 2016-09-08 DIAGNOSIS — Z992 Dependence on renal dialysis: Secondary | ICD-10-CM | POA: Diagnosis not present

## 2016-09-09 DIAGNOSIS — L89314 Pressure ulcer of right buttock, stage 4: Secondary | ICD-10-CM | POA: Diagnosis not present

## 2016-09-09 DIAGNOSIS — N186 End stage renal disease: Secondary | ICD-10-CM | POA: Diagnosis not present

## 2016-09-09 DIAGNOSIS — I120 Hypertensive chronic kidney disease with stage 5 chronic kidney disease or end stage renal disease: Secondary | ICD-10-CM | POA: Diagnosis not present

## 2016-09-09 DIAGNOSIS — G35 Multiple sclerosis: Secondary | ICD-10-CM | POA: Diagnosis not present

## 2016-09-09 DIAGNOSIS — L89222 Pressure ulcer of left hip, stage 2: Secondary | ICD-10-CM | POA: Diagnosis not present

## 2016-09-09 DIAGNOSIS — R509 Fever, unspecified: Secondary | ICD-10-CM | POA: Diagnosis not present

## 2016-09-09 DIAGNOSIS — E1122 Type 2 diabetes mellitus with diabetic chronic kidney disease: Secondary | ICD-10-CM | POA: Diagnosis not present

## 2016-09-10 DIAGNOSIS — E1122 Type 2 diabetes mellitus with diabetic chronic kidney disease: Secondary | ICD-10-CM | POA: Diagnosis not present

## 2016-09-10 DIAGNOSIS — N2581 Secondary hyperparathyroidism of renal origin: Secondary | ICD-10-CM | POA: Diagnosis not present

## 2016-09-10 DIAGNOSIS — D631 Anemia in chronic kidney disease: Secondary | ICD-10-CM | POA: Diagnosis not present

## 2016-09-10 DIAGNOSIS — Z992 Dependence on renal dialysis: Secondary | ICD-10-CM | POA: Diagnosis not present

## 2016-09-10 DIAGNOSIS — N186 End stage renal disease: Secondary | ICD-10-CM | POA: Diagnosis not present

## 2016-09-11 DIAGNOSIS — N186 End stage renal disease: Secondary | ICD-10-CM | POA: Diagnosis not present

## 2016-09-11 DIAGNOSIS — L89314 Pressure ulcer of right buttock, stage 4: Secondary | ICD-10-CM | POA: Diagnosis not present

## 2016-09-11 DIAGNOSIS — L89222 Pressure ulcer of left hip, stage 2: Secondary | ICD-10-CM | POA: Diagnosis not present

## 2016-09-11 DIAGNOSIS — I120 Hypertensive chronic kidney disease with stage 5 chronic kidney disease or end stage renal disease: Secondary | ICD-10-CM | POA: Diagnosis not present

## 2016-09-11 DIAGNOSIS — E1122 Type 2 diabetes mellitus with diabetic chronic kidney disease: Secondary | ICD-10-CM | POA: Diagnosis not present

## 2016-09-11 DIAGNOSIS — G35 Multiple sclerosis: Secondary | ICD-10-CM | POA: Diagnosis not present

## 2016-09-12 DIAGNOSIS — D631 Anemia in chronic kidney disease: Secondary | ICD-10-CM | POA: Diagnosis not present

## 2016-09-12 DIAGNOSIS — N186 End stage renal disease: Secondary | ICD-10-CM | POA: Diagnosis not present

## 2016-09-12 DIAGNOSIS — Z992 Dependence on renal dialysis: Secondary | ICD-10-CM | POA: Diagnosis not present

## 2016-09-12 DIAGNOSIS — N2581 Secondary hyperparathyroidism of renal origin: Secondary | ICD-10-CM | POA: Diagnosis not present

## 2016-09-12 DIAGNOSIS — E1122 Type 2 diabetes mellitus with diabetic chronic kidney disease: Secondary | ICD-10-CM | POA: Diagnosis not present

## 2016-09-14 DIAGNOSIS — N186 End stage renal disease: Secondary | ICD-10-CM | POA: Diagnosis not present

## 2016-09-14 DIAGNOSIS — L89222 Pressure ulcer of left hip, stage 2: Secondary | ICD-10-CM | POA: Diagnosis not present

## 2016-09-14 DIAGNOSIS — E1122 Type 2 diabetes mellitus with diabetic chronic kidney disease: Secondary | ICD-10-CM | POA: Diagnosis not present

## 2016-09-14 DIAGNOSIS — I120 Hypertensive chronic kidney disease with stage 5 chronic kidney disease or end stage renal disease: Secondary | ICD-10-CM | POA: Diagnosis not present

## 2016-09-14 DIAGNOSIS — N319 Neuromuscular dysfunction of bladder, unspecified: Secondary | ICD-10-CM | POA: Diagnosis not present

## 2016-09-14 DIAGNOSIS — G35 Multiple sclerosis: Secondary | ICD-10-CM | POA: Diagnosis not present

## 2016-09-14 DIAGNOSIS — L89314 Pressure ulcer of right buttock, stage 4: Secondary | ICD-10-CM | POA: Diagnosis not present

## 2016-09-15 DIAGNOSIS — Z992 Dependence on renal dialysis: Secondary | ICD-10-CM | POA: Diagnosis not present

## 2016-09-15 DIAGNOSIS — D631 Anemia in chronic kidney disease: Secondary | ICD-10-CM | POA: Diagnosis not present

## 2016-09-15 DIAGNOSIS — N186 End stage renal disease: Secondary | ICD-10-CM | POA: Diagnosis not present

## 2016-09-15 DIAGNOSIS — E1122 Type 2 diabetes mellitus with diabetic chronic kidney disease: Secondary | ICD-10-CM | POA: Diagnosis not present

## 2016-09-15 DIAGNOSIS — N2581 Secondary hyperparathyroidism of renal origin: Secondary | ICD-10-CM | POA: Diagnosis not present

## 2016-09-16 DIAGNOSIS — L89222 Pressure ulcer of left hip, stage 2: Secondary | ICD-10-CM | POA: Diagnosis not present

## 2016-09-16 DIAGNOSIS — E1122 Type 2 diabetes mellitus with diabetic chronic kidney disease: Secondary | ICD-10-CM | POA: Diagnosis not present

## 2016-09-16 DIAGNOSIS — L89314 Pressure ulcer of right buttock, stage 4: Secondary | ICD-10-CM | POA: Diagnosis not present

## 2016-09-16 DIAGNOSIS — N186 End stage renal disease: Secondary | ICD-10-CM | POA: Diagnosis not present

## 2016-09-16 DIAGNOSIS — I120 Hypertensive chronic kidney disease with stage 5 chronic kidney disease or end stage renal disease: Secondary | ICD-10-CM | POA: Diagnosis not present

## 2016-09-16 DIAGNOSIS — G35 Multiple sclerosis: Secondary | ICD-10-CM | POA: Diagnosis not present

## 2016-09-17 DIAGNOSIS — N2581 Secondary hyperparathyroidism of renal origin: Secondary | ICD-10-CM | POA: Diagnosis not present

## 2016-09-17 DIAGNOSIS — N186 End stage renal disease: Secondary | ICD-10-CM | POA: Diagnosis not present

## 2016-09-17 DIAGNOSIS — Z992 Dependence on renal dialysis: Secondary | ICD-10-CM | POA: Diagnosis not present

## 2016-09-17 DIAGNOSIS — E1122 Type 2 diabetes mellitus with diabetic chronic kidney disease: Secondary | ICD-10-CM | POA: Diagnosis not present

## 2016-09-17 DIAGNOSIS — D631 Anemia in chronic kidney disease: Secondary | ICD-10-CM | POA: Diagnosis not present

## 2016-09-18 ENCOUNTER — Encounter (HOSPITAL_BASED_OUTPATIENT_CLINIC_OR_DEPARTMENT_OTHER): Payer: Medicare Other | Attending: Internal Medicine

## 2016-09-18 DIAGNOSIS — L89222 Pressure ulcer of left hip, stage 2: Secondary | ICD-10-CM | POA: Diagnosis not present

## 2016-09-18 DIAGNOSIS — Z452 Encounter for adjustment and management of vascular access device: Secondary | ICD-10-CM | POA: Diagnosis not present

## 2016-09-18 DIAGNOSIS — Z466 Encounter for fitting and adjustment of urinary device: Secondary | ICD-10-CM | POA: Diagnosis not present

## 2016-09-18 DIAGNOSIS — R1312 Dysphagia, oropharyngeal phase: Secondary | ICD-10-CM | POA: Diagnosis not present

## 2016-09-18 DIAGNOSIS — E1122 Type 2 diabetes mellitus with diabetic chronic kidney disease: Secondary | ICD-10-CM | POA: Insufficient documentation

## 2016-09-18 DIAGNOSIS — D638 Anemia in other chronic diseases classified elsewhere: Secondary | ICD-10-CM | POA: Diagnosis not present

## 2016-09-18 DIAGNOSIS — L89322 Pressure ulcer of left buttock, stage 2: Secondary | ICD-10-CM | POA: Insufficient documentation

## 2016-09-18 DIAGNOSIS — N186 End stage renal disease: Secondary | ICD-10-CM | POA: Diagnosis not present

## 2016-09-18 DIAGNOSIS — G35 Multiple sclerosis: Secondary | ICD-10-CM | POA: Diagnosis not present

## 2016-09-18 DIAGNOSIS — Z87440 Personal history of urinary (tract) infections: Secondary | ICD-10-CM | POA: Diagnosis not present

## 2016-09-18 DIAGNOSIS — Z794 Long term (current) use of insulin: Secondary | ICD-10-CM | POA: Diagnosis not present

## 2016-09-18 DIAGNOSIS — G8220 Paraplegia, unspecified: Secondary | ICD-10-CM | POA: Insufficient documentation

## 2016-09-18 DIAGNOSIS — I120 Hypertensive chronic kidney disease with stage 5 chronic kidney disease or end stage renal disease: Secondary | ICD-10-CM | POA: Insufficient documentation

## 2016-09-18 DIAGNOSIS — Z992 Dependence on renal dialysis: Secondary | ICD-10-CM | POA: Diagnosis not present

## 2016-09-18 DIAGNOSIS — E785 Hyperlipidemia, unspecified: Secondary | ICD-10-CM | POA: Diagnosis not present

## 2016-09-18 DIAGNOSIS — I429 Cardiomyopathy, unspecified: Secondary | ICD-10-CM | POA: Diagnosis not present

## 2016-09-18 DIAGNOSIS — L89314 Pressure ulcer of right buttock, stage 4: Secondary | ICD-10-CM | POA: Diagnosis not present

## 2016-09-18 DIAGNOSIS — N319 Neuromuscular dysfunction of bladder, unspecified: Secondary | ICD-10-CM | POA: Diagnosis not present

## 2016-09-18 DIAGNOSIS — L89329 Pressure ulcer of left buttock, unspecified stage: Secondary | ICD-10-CM | POA: Diagnosis not present

## 2016-09-18 DIAGNOSIS — Z8701 Personal history of pneumonia (recurrent): Secondary | ICD-10-CM | POA: Diagnosis not present

## 2016-09-19 DIAGNOSIS — E1122 Type 2 diabetes mellitus with diabetic chronic kidney disease: Secondary | ICD-10-CM | POA: Diagnosis not present

## 2016-09-19 DIAGNOSIS — Z992 Dependence on renal dialysis: Secondary | ICD-10-CM | POA: Diagnosis not present

## 2016-09-19 DIAGNOSIS — N2581 Secondary hyperparathyroidism of renal origin: Secondary | ICD-10-CM | POA: Diagnosis not present

## 2016-09-19 DIAGNOSIS — N186 End stage renal disease: Secondary | ICD-10-CM | POA: Diagnosis not present

## 2016-09-19 DIAGNOSIS — D631 Anemia in chronic kidney disease: Secondary | ICD-10-CM | POA: Diagnosis not present

## 2016-09-21 DIAGNOSIS — L89222 Pressure ulcer of left hip, stage 2: Secondary | ICD-10-CM | POA: Diagnosis not present

## 2016-09-21 DIAGNOSIS — N186 End stage renal disease: Secondary | ICD-10-CM | POA: Diagnosis not present

## 2016-09-21 DIAGNOSIS — I120 Hypertensive chronic kidney disease with stage 5 chronic kidney disease or end stage renal disease: Secondary | ICD-10-CM | POA: Diagnosis not present

## 2016-09-21 DIAGNOSIS — G35 Multiple sclerosis: Secondary | ICD-10-CM | POA: Diagnosis not present

## 2016-09-21 DIAGNOSIS — L89314 Pressure ulcer of right buttock, stage 4: Secondary | ICD-10-CM | POA: Diagnosis not present

## 2016-09-21 DIAGNOSIS — E1122 Type 2 diabetes mellitus with diabetic chronic kidney disease: Secondary | ICD-10-CM | POA: Diagnosis not present

## 2016-09-22 DIAGNOSIS — D631 Anemia in chronic kidney disease: Secondary | ICD-10-CM | POA: Diagnosis not present

## 2016-09-22 DIAGNOSIS — N2581 Secondary hyperparathyroidism of renal origin: Secondary | ICD-10-CM | POA: Diagnosis not present

## 2016-09-22 DIAGNOSIS — E1122 Type 2 diabetes mellitus with diabetic chronic kidney disease: Secondary | ICD-10-CM | POA: Diagnosis not present

## 2016-09-22 DIAGNOSIS — N186 End stage renal disease: Secondary | ICD-10-CM | POA: Diagnosis not present

## 2016-09-22 DIAGNOSIS — Z992 Dependence on renal dialysis: Secondary | ICD-10-CM | POA: Diagnosis not present

## 2016-09-23 DIAGNOSIS — E1122 Type 2 diabetes mellitus with diabetic chronic kidney disease: Secondary | ICD-10-CM | POA: Diagnosis not present

## 2016-09-23 DIAGNOSIS — I120 Hypertensive chronic kidney disease with stage 5 chronic kidney disease or end stage renal disease: Secondary | ICD-10-CM | POA: Diagnosis not present

## 2016-09-23 DIAGNOSIS — N186 End stage renal disease: Secondary | ICD-10-CM | POA: Diagnosis not present

## 2016-09-23 DIAGNOSIS — L89314 Pressure ulcer of right buttock, stage 4: Secondary | ICD-10-CM | POA: Diagnosis not present

## 2016-09-23 DIAGNOSIS — G35 Multiple sclerosis: Secondary | ICD-10-CM | POA: Diagnosis not present

## 2016-09-23 DIAGNOSIS — L89222 Pressure ulcer of left hip, stage 2: Secondary | ICD-10-CM | POA: Diagnosis not present

## 2016-09-24 DIAGNOSIS — Z992 Dependence on renal dialysis: Secondary | ICD-10-CM | POA: Diagnosis not present

## 2016-09-24 DIAGNOSIS — E1122 Type 2 diabetes mellitus with diabetic chronic kidney disease: Secondary | ICD-10-CM | POA: Diagnosis not present

## 2016-09-24 DIAGNOSIS — N2581 Secondary hyperparathyroidism of renal origin: Secondary | ICD-10-CM | POA: Diagnosis not present

## 2016-09-24 DIAGNOSIS — D631 Anemia in chronic kidney disease: Secondary | ICD-10-CM | POA: Diagnosis not present

## 2016-09-24 DIAGNOSIS — N186 End stage renal disease: Secondary | ICD-10-CM | POA: Diagnosis not present

## 2016-09-25 DIAGNOSIS — E1122 Type 2 diabetes mellitus with diabetic chronic kidney disease: Secondary | ICD-10-CM | POA: Diagnosis not present

## 2016-09-25 DIAGNOSIS — Z992 Dependence on renal dialysis: Secondary | ICD-10-CM | POA: Diagnosis not present

## 2016-09-25 DIAGNOSIS — N186 End stage renal disease: Secondary | ICD-10-CM | POA: Diagnosis not present

## 2016-09-25 DIAGNOSIS — D631 Anemia in chronic kidney disease: Secondary | ICD-10-CM | POA: Diagnosis not present

## 2016-09-25 DIAGNOSIS — N2581 Secondary hyperparathyroidism of renal origin: Secondary | ICD-10-CM | POA: Diagnosis not present

## 2016-09-26 DIAGNOSIS — N2581 Secondary hyperparathyroidism of renal origin: Secondary | ICD-10-CM | POA: Diagnosis not present

## 2016-09-26 DIAGNOSIS — Z992 Dependence on renal dialysis: Secondary | ICD-10-CM | POA: Diagnosis not present

## 2016-09-26 DIAGNOSIS — E1122 Type 2 diabetes mellitus with diabetic chronic kidney disease: Secondary | ICD-10-CM | POA: Diagnosis not present

## 2016-09-26 DIAGNOSIS — D631 Anemia in chronic kidney disease: Secondary | ICD-10-CM | POA: Diagnosis not present

## 2016-09-26 DIAGNOSIS — N186 End stage renal disease: Secondary | ICD-10-CM | POA: Diagnosis not present

## 2016-09-28 DIAGNOSIS — L89314 Pressure ulcer of right buttock, stage 4: Secondary | ICD-10-CM | POA: Diagnosis not present

## 2016-09-28 DIAGNOSIS — E1122 Type 2 diabetes mellitus with diabetic chronic kidney disease: Secondary | ICD-10-CM | POA: Diagnosis not present

## 2016-09-28 DIAGNOSIS — L89222 Pressure ulcer of left hip, stage 2: Secondary | ICD-10-CM | POA: Diagnosis not present

## 2016-09-28 DIAGNOSIS — G35 Multiple sclerosis: Secondary | ICD-10-CM | POA: Diagnosis not present

## 2016-09-28 DIAGNOSIS — N186 End stage renal disease: Secondary | ICD-10-CM | POA: Diagnosis not present

## 2016-09-28 DIAGNOSIS — I120 Hypertensive chronic kidney disease with stage 5 chronic kidney disease or end stage renal disease: Secondary | ICD-10-CM | POA: Diagnosis not present

## 2016-09-29 DIAGNOSIS — N186 End stage renal disease: Secondary | ICD-10-CM | POA: Diagnosis not present

## 2016-09-29 DIAGNOSIS — E1122 Type 2 diabetes mellitus with diabetic chronic kidney disease: Secondary | ICD-10-CM | POA: Diagnosis not present

## 2016-09-29 DIAGNOSIS — D631 Anemia in chronic kidney disease: Secondary | ICD-10-CM | POA: Diagnosis not present

## 2016-09-29 DIAGNOSIS — N2581 Secondary hyperparathyroidism of renal origin: Secondary | ICD-10-CM | POA: Diagnosis not present

## 2016-09-29 DIAGNOSIS — Z992 Dependence on renal dialysis: Secondary | ICD-10-CM | POA: Diagnosis not present

## 2016-09-30 DIAGNOSIS — L89222 Pressure ulcer of left hip, stage 2: Secondary | ICD-10-CM | POA: Diagnosis not present

## 2016-09-30 DIAGNOSIS — E1122 Type 2 diabetes mellitus with diabetic chronic kidney disease: Secondary | ICD-10-CM | POA: Diagnosis not present

## 2016-09-30 DIAGNOSIS — I120 Hypertensive chronic kidney disease with stage 5 chronic kidney disease or end stage renal disease: Secondary | ICD-10-CM | POA: Diagnosis not present

## 2016-09-30 DIAGNOSIS — N186 End stage renal disease: Secondary | ICD-10-CM | POA: Diagnosis not present

## 2016-09-30 DIAGNOSIS — L89314 Pressure ulcer of right buttock, stage 4: Secondary | ICD-10-CM | POA: Diagnosis not present

## 2016-09-30 DIAGNOSIS — G35 Multiple sclerosis: Secondary | ICD-10-CM | POA: Diagnosis not present

## 2016-10-01 DIAGNOSIS — E1122 Type 2 diabetes mellitus with diabetic chronic kidney disease: Secondary | ICD-10-CM | POA: Diagnosis not present

## 2016-10-01 DIAGNOSIS — Z992 Dependence on renal dialysis: Secondary | ICD-10-CM | POA: Diagnosis not present

## 2016-10-01 DIAGNOSIS — N2581 Secondary hyperparathyroidism of renal origin: Secondary | ICD-10-CM | POA: Diagnosis not present

## 2016-10-01 DIAGNOSIS — N186 End stage renal disease: Secondary | ICD-10-CM | POA: Diagnosis not present

## 2016-10-01 DIAGNOSIS — D631 Anemia in chronic kidney disease: Secondary | ICD-10-CM | POA: Diagnosis not present

## 2016-10-02 DIAGNOSIS — L89314 Pressure ulcer of right buttock, stage 4: Secondary | ICD-10-CM | POA: Diagnosis not present

## 2016-10-02 DIAGNOSIS — G35 Multiple sclerosis: Secondary | ICD-10-CM | POA: Diagnosis not present

## 2016-10-02 DIAGNOSIS — I120 Hypertensive chronic kidney disease with stage 5 chronic kidney disease or end stage renal disease: Secondary | ICD-10-CM | POA: Diagnosis not present

## 2016-10-02 DIAGNOSIS — N186 End stage renal disease: Secondary | ICD-10-CM | POA: Diagnosis not present

## 2016-10-02 DIAGNOSIS — E1122 Type 2 diabetes mellitus with diabetic chronic kidney disease: Secondary | ICD-10-CM | POA: Diagnosis not present

## 2016-10-02 DIAGNOSIS — L89222 Pressure ulcer of left hip, stage 2: Secondary | ICD-10-CM | POA: Diagnosis not present

## 2016-10-03 DIAGNOSIS — Z992 Dependence on renal dialysis: Secondary | ICD-10-CM | POA: Diagnosis not present

## 2016-10-03 DIAGNOSIS — N186 End stage renal disease: Secondary | ICD-10-CM | POA: Diagnosis not present

## 2016-10-03 DIAGNOSIS — D631 Anemia in chronic kidney disease: Secondary | ICD-10-CM | POA: Diagnosis not present

## 2016-10-03 DIAGNOSIS — N2581 Secondary hyperparathyroidism of renal origin: Secondary | ICD-10-CM | POA: Diagnosis not present

## 2016-10-03 DIAGNOSIS — E1122 Type 2 diabetes mellitus with diabetic chronic kidney disease: Secondary | ICD-10-CM | POA: Diagnosis not present

## 2016-10-03 DIAGNOSIS — Z23 Encounter for immunization: Secondary | ICD-10-CM | POA: Diagnosis not present

## 2016-10-05 DIAGNOSIS — N186 End stage renal disease: Secondary | ICD-10-CM | POA: Diagnosis not present

## 2016-10-05 DIAGNOSIS — G35 Multiple sclerosis: Secondary | ICD-10-CM | POA: Diagnosis not present

## 2016-10-05 DIAGNOSIS — L89222 Pressure ulcer of left hip, stage 2: Secondary | ICD-10-CM | POA: Diagnosis not present

## 2016-10-05 DIAGNOSIS — L89314 Pressure ulcer of right buttock, stage 4: Secondary | ICD-10-CM | POA: Diagnosis not present

## 2016-10-05 DIAGNOSIS — I120 Hypertensive chronic kidney disease with stage 5 chronic kidney disease or end stage renal disease: Secondary | ICD-10-CM | POA: Diagnosis not present

## 2016-10-05 DIAGNOSIS — E1122 Type 2 diabetes mellitus with diabetic chronic kidney disease: Secondary | ICD-10-CM | POA: Diagnosis not present

## 2016-10-06 DIAGNOSIS — N186 End stage renal disease: Secondary | ICD-10-CM | POA: Diagnosis not present

## 2016-10-06 DIAGNOSIS — E1122 Type 2 diabetes mellitus with diabetic chronic kidney disease: Secondary | ICD-10-CM | POA: Diagnosis not present

## 2016-10-06 DIAGNOSIS — Z23 Encounter for immunization: Secondary | ICD-10-CM | POA: Diagnosis not present

## 2016-10-06 DIAGNOSIS — N2581 Secondary hyperparathyroidism of renal origin: Secondary | ICD-10-CM | POA: Diagnosis not present

## 2016-10-06 DIAGNOSIS — Z992 Dependence on renal dialysis: Secondary | ICD-10-CM | POA: Diagnosis not present

## 2016-10-06 DIAGNOSIS — D631 Anemia in chronic kidney disease: Secondary | ICD-10-CM | POA: Diagnosis not present

## 2016-10-07 DIAGNOSIS — G35 Multiple sclerosis: Secondary | ICD-10-CM | POA: Diagnosis not present

## 2016-10-07 DIAGNOSIS — I120 Hypertensive chronic kidney disease with stage 5 chronic kidney disease or end stage renal disease: Secondary | ICD-10-CM | POA: Diagnosis not present

## 2016-10-07 DIAGNOSIS — N186 End stage renal disease: Secondary | ICD-10-CM | POA: Diagnosis not present

## 2016-10-07 DIAGNOSIS — E1122 Type 2 diabetes mellitus with diabetic chronic kidney disease: Secondary | ICD-10-CM | POA: Diagnosis not present

## 2016-10-07 DIAGNOSIS — L89314 Pressure ulcer of right buttock, stage 4: Secondary | ICD-10-CM | POA: Diagnosis not present

## 2016-10-07 DIAGNOSIS — L89222 Pressure ulcer of left hip, stage 2: Secondary | ICD-10-CM | POA: Diagnosis not present

## 2016-10-08 DIAGNOSIS — N2581 Secondary hyperparathyroidism of renal origin: Secondary | ICD-10-CM | POA: Diagnosis not present

## 2016-10-08 DIAGNOSIS — Z23 Encounter for immunization: Secondary | ICD-10-CM | POA: Diagnosis not present

## 2016-10-08 DIAGNOSIS — Z992 Dependence on renal dialysis: Secondary | ICD-10-CM | POA: Diagnosis not present

## 2016-10-08 DIAGNOSIS — N186 End stage renal disease: Secondary | ICD-10-CM | POA: Diagnosis not present

## 2016-10-08 DIAGNOSIS — D631 Anemia in chronic kidney disease: Secondary | ICD-10-CM | POA: Diagnosis not present

## 2016-10-08 DIAGNOSIS — E1122 Type 2 diabetes mellitus with diabetic chronic kidney disease: Secondary | ICD-10-CM | POA: Diagnosis not present

## 2016-10-09 DIAGNOSIS — G35 Multiple sclerosis: Secondary | ICD-10-CM | POA: Diagnosis not present

## 2016-10-09 DIAGNOSIS — N186 End stage renal disease: Secondary | ICD-10-CM | POA: Diagnosis not present

## 2016-10-09 DIAGNOSIS — L89314 Pressure ulcer of right buttock, stage 4: Secondary | ICD-10-CM | POA: Diagnosis not present

## 2016-10-09 DIAGNOSIS — I120 Hypertensive chronic kidney disease with stage 5 chronic kidney disease or end stage renal disease: Secondary | ICD-10-CM | POA: Diagnosis not present

## 2016-10-09 DIAGNOSIS — L89222 Pressure ulcer of left hip, stage 2: Secondary | ICD-10-CM | POA: Diagnosis not present

## 2016-10-09 DIAGNOSIS — E1122 Type 2 diabetes mellitus with diabetic chronic kidney disease: Secondary | ICD-10-CM | POA: Diagnosis not present

## 2016-10-10 DIAGNOSIS — N186 End stage renal disease: Secondary | ICD-10-CM | POA: Diagnosis not present

## 2016-10-10 DIAGNOSIS — E1122 Type 2 diabetes mellitus with diabetic chronic kidney disease: Secondary | ICD-10-CM | POA: Diagnosis not present

## 2016-10-10 DIAGNOSIS — N2581 Secondary hyperparathyroidism of renal origin: Secondary | ICD-10-CM | POA: Diagnosis not present

## 2016-10-10 DIAGNOSIS — Z23 Encounter for immunization: Secondary | ICD-10-CM | POA: Diagnosis not present

## 2016-10-10 DIAGNOSIS — Z992 Dependence on renal dialysis: Secondary | ICD-10-CM | POA: Diagnosis not present

## 2016-10-10 DIAGNOSIS — D631 Anemia in chronic kidney disease: Secondary | ICD-10-CM | POA: Diagnosis not present

## 2016-10-12 DIAGNOSIS — E1122 Type 2 diabetes mellitus with diabetic chronic kidney disease: Secondary | ICD-10-CM | POA: Diagnosis not present

## 2016-10-12 DIAGNOSIS — N186 End stage renal disease: Secondary | ICD-10-CM | POA: Diagnosis not present

## 2016-10-12 DIAGNOSIS — I120 Hypertensive chronic kidney disease with stage 5 chronic kidney disease or end stage renal disease: Secondary | ICD-10-CM | POA: Diagnosis not present

## 2016-10-12 DIAGNOSIS — L89222 Pressure ulcer of left hip, stage 2: Secondary | ICD-10-CM | POA: Diagnosis not present

## 2016-10-12 DIAGNOSIS — G35 Multiple sclerosis: Secondary | ICD-10-CM | POA: Diagnosis not present

## 2016-10-12 DIAGNOSIS — L89314 Pressure ulcer of right buttock, stage 4: Secondary | ICD-10-CM | POA: Diagnosis not present

## 2016-10-13 DIAGNOSIS — E1122 Type 2 diabetes mellitus with diabetic chronic kidney disease: Secondary | ICD-10-CM | POA: Diagnosis not present

## 2016-10-13 DIAGNOSIS — N186 End stage renal disease: Secondary | ICD-10-CM | POA: Diagnosis not present

## 2016-10-13 DIAGNOSIS — Z23 Encounter for immunization: Secondary | ICD-10-CM | POA: Diagnosis not present

## 2016-10-13 DIAGNOSIS — Z992 Dependence on renal dialysis: Secondary | ICD-10-CM | POA: Diagnosis not present

## 2016-10-13 DIAGNOSIS — D631 Anemia in chronic kidney disease: Secondary | ICD-10-CM | POA: Diagnosis not present

## 2016-10-13 DIAGNOSIS — N2581 Secondary hyperparathyroidism of renal origin: Secondary | ICD-10-CM | POA: Diagnosis not present

## 2016-10-14 DIAGNOSIS — L89314 Pressure ulcer of right buttock, stage 4: Secondary | ICD-10-CM | POA: Diagnosis not present

## 2016-10-14 DIAGNOSIS — E1122 Type 2 diabetes mellitus with diabetic chronic kidney disease: Secondary | ICD-10-CM | POA: Diagnosis not present

## 2016-10-14 DIAGNOSIS — I120 Hypertensive chronic kidney disease with stage 5 chronic kidney disease or end stage renal disease: Secondary | ICD-10-CM | POA: Diagnosis not present

## 2016-10-14 DIAGNOSIS — N186 End stage renal disease: Secondary | ICD-10-CM | POA: Diagnosis not present

## 2016-10-14 DIAGNOSIS — G35 Multiple sclerosis: Secondary | ICD-10-CM | POA: Diagnosis not present

## 2016-10-14 DIAGNOSIS — L89222 Pressure ulcer of left hip, stage 2: Secondary | ICD-10-CM | POA: Diagnosis not present

## 2016-10-15 DIAGNOSIS — Z23 Encounter for immunization: Secondary | ICD-10-CM | POA: Diagnosis not present

## 2016-10-15 DIAGNOSIS — D631 Anemia in chronic kidney disease: Secondary | ICD-10-CM | POA: Diagnosis not present

## 2016-10-15 DIAGNOSIS — E1122 Type 2 diabetes mellitus with diabetic chronic kidney disease: Secondary | ICD-10-CM | POA: Diagnosis not present

## 2016-10-15 DIAGNOSIS — N186 End stage renal disease: Secondary | ICD-10-CM | POA: Diagnosis not present

## 2016-10-15 DIAGNOSIS — Z992 Dependence on renal dialysis: Secondary | ICD-10-CM | POA: Diagnosis not present

## 2016-10-15 DIAGNOSIS — N2581 Secondary hyperparathyroidism of renal origin: Secondary | ICD-10-CM | POA: Diagnosis not present

## 2016-10-16 DIAGNOSIS — G35 Multiple sclerosis: Secondary | ICD-10-CM | POA: Diagnosis not present

## 2016-10-16 DIAGNOSIS — L89222 Pressure ulcer of left hip, stage 2: Secondary | ICD-10-CM | POA: Diagnosis not present

## 2016-10-16 DIAGNOSIS — N186 End stage renal disease: Secondary | ICD-10-CM | POA: Diagnosis not present

## 2016-10-16 DIAGNOSIS — I120 Hypertensive chronic kidney disease with stage 5 chronic kidney disease or end stage renal disease: Secondary | ICD-10-CM | POA: Diagnosis not present

## 2016-10-16 DIAGNOSIS — E1122 Type 2 diabetes mellitus with diabetic chronic kidney disease: Secondary | ICD-10-CM | POA: Diagnosis not present

## 2016-10-16 DIAGNOSIS — L89314 Pressure ulcer of right buttock, stage 4: Secondary | ICD-10-CM | POA: Diagnosis not present

## 2016-10-17 DIAGNOSIS — D631 Anemia in chronic kidney disease: Secondary | ICD-10-CM | POA: Diagnosis not present

## 2016-10-17 DIAGNOSIS — N186 End stage renal disease: Secondary | ICD-10-CM | POA: Diagnosis not present

## 2016-10-17 DIAGNOSIS — Z23 Encounter for immunization: Secondary | ICD-10-CM | POA: Diagnosis not present

## 2016-10-17 DIAGNOSIS — N2581 Secondary hyperparathyroidism of renal origin: Secondary | ICD-10-CM | POA: Diagnosis not present

## 2016-10-17 DIAGNOSIS — E1122 Type 2 diabetes mellitus with diabetic chronic kidney disease: Secondary | ICD-10-CM | POA: Diagnosis not present

## 2016-10-17 DIAGNOSIS — Z992 Dependence on renal dialysis: Secondary | ICD-10-CM | POA: Diagnosis not present

## 2016-10-19 DIAGNOSIS — L89222 Pressure ulcer of left hip, stage 2: Secondary | ICD-10-CM | POA: Diagnosis not present

## 2016-10-19 DIAGNOSIS — I120 Hypertensive chronic kidney disease with stage 5 chronic kidney disease or end stage renal disease: Secondary | ICD-10-CM | POA: Diagnosis not present

## 2016-10-19 DIAGNOSIS — N186 End stage renal disease: Secondary | ICD-10-CM | POA: Diagnosis not present

## 2016-10-19 DIAGNOSIS — E1122 Type 2 diabetes mellitus with diabetic chronic kidney disease: Secondary | ICD-10-CM | POA: Diagnosis not present

## 2016-10-19 DIAGNOSIS — L89314 Pressure ulcer of right buttock, stage 4: Secondary | ICD-10-CM | POA: Diagnosis not present

## 2016-10-19 DIAGNOSIS — G35 Multiple sclerosis: Secondary | ICD-10-CM | POA: Diagnosis not present

## 2016-10-20 DIAGNOSIS — Z23 Encounter for immunization: Secondary | ICD-10-CM | POA: Diagnosis not present

## 2016-10-20 DIAGNOSIS — N2581 Secondary hyperparathyroidism of renal origin: Secondary | ICD-10-CM | POA: Diagnosis not present

## 2016-10-20 DIAGNOSIS — Z992 Dependence on renal dialysis: Secondary | ICD-10-CM | POA: Diagnosis not present

## 2016-10-20 DIAGNOSIS — E1122 Type 2 diabetes mellitus with diabetic chronic kidney disease: Secondary | ICD-10-CM | POA: Diagnosis not present

## 2016-10-20 DIAGNOSIS — D631 Anemia in chronic kidney disease: Secondary | ICD-10-CM | POA: Diagnosis not present

## 2016-10-20 DIAGNOSIS — N186 End stage renal disease: Secondary | ICD-10-CM | POA: Diagnosis not present

## 2016-10-21 DIAGNOSIS — G35 Multiple sclerosis: Secondary | ICD-10-CM | POA: Diagnosis not present

## 2016-10-21 DIAGNOSIS — N186 End stage renal disease: Secondary | ICD-10-CM | POA: Diagnosis not present

## 2016-10-21 DIAGNOSIS — E1122 Type 2 diabetes mellitus with diabetic chronic kidney disease: Secondary | ICD-10-CM | POA: Diagnosis not present

## 2016-10-21 DIAGNOSIS — I120 Hypertensive chronic kidney disease with stage 5 chronic kidney disease or end stage renal disease: Secondary | ICD-10-CM | POA: Diagnosis not present

## 2016-10-21 DIAGNOSIS — L89314 Pressure ulcer of right buttock, stage 4: Secondary | ICD-10-CM | POA: Diagnosis not present

## 2016-10-21 DIAGNOSIS — L89222 Pressure ulcer of left hip, stage 2: Secondary | ICD-10-CM | POA: Diagnosis not present

## 2016-10-22 DIAGNOSIS — E1122 Type 2 diabetes mellitus with diabetic chronic kidney disease: Secondary | ICD-10-CM | POA: Diagnosis not present

## 2016-10-22 DIAGNOSIS — D631 Anemia in chronic kidney disease: Secondary | ICD-10-CM | POA: Diagnosis not present

## 2016-10-22 DIAGNOSIS — Z992 Dependence on renal dialysis: Secondary | ICD-10-CM | POA: Diagnosis not present

## 2016-10-22 DIAGNOSIS — N186 End stage renal disease: Secondary | ICD-10-CM | POA: Diagnosis not present

## 2016-10-22 DIAGNOSIS — Z23 Encounter for immunization: Secondary | ICD-10-CM | POA: Diagnosis not present

## 2016-10-22 DIAGNOSIS — N2581 Secondary hyperparathyroidism of renal origin: Secondary | ICD-10-CM | POA: Diagnosis not present

## 2016-10-23 ENCOUNTER — Other Ambulatory Visit (HOSPITAL_COMMUNITY)
Admission: RE | Admit: 2016-10-23 | Discharge: 2016-10-23 | Disposition: A | Discharge status: Home / Self Care | Payer: Medicare Other | Source: Other Acute Inpatient Hospital | Attending: Internal Medicine | Admitting: Internal Medicine

## 2016-10-23 ENCOUNTER — Encounter (HOSPITAL_BASED_OUTPATIENT_CLINIC_OR_DEPARTMENT_OTHER): Payer: Medicare Other | Attending: Internal Medicine

## 2016-10-23 DIAGNOSIS — L89314 Pressure ulcer of right buttock, stage 4: Secondary | ICD-10-CM | POA: Diagnosis not present

## 2016-10-23 DIAGNOSIS — L89893 Pressure ulcer of other site, stage 3: Secondary | ICD-10-CM | POA: Insufficient documentation

## 2016-10-23 DIAGNOSIS — E11621 Type 2 diabetes mellitus with foot ulcer: Secondary | ICD-10-CM | POA: Diagnosis not present

## 2016-10-23 DIAGNOSIS — G35 Multiple sclerosis: Secondary | ICD-10-CM | POA: Diagnosis not present

## 2016-10-23 DIAGNOSIS — S31502A Unspecified open wound of unspecified external genital organs, female, initial encounter: Secondary | ICD-10-CM | POA: Diagnosis not present

## 2016-10-23 DIAGNOSIS — L97524 Non-pressure chronic ulcer of other part of left foot with necrosis of bone: Secondary | ICD-10-CM | POA: Diagnosis not present

## 2016-10-23 DIAGNOSIS — L03116 Cellulitis of left lower limb: Secondary | ICD-10-CM | POA: Diagnosis not present

## 2016-10-23 DIAGNOSIS — L89323 Pressure ulcer of left buttock, stage 3: Secondary | ICD-10-CM | POA: Insufficient documentation

## 2016-10-23 DIAGNOSIS — S31829A Unspecified open wound of left buttock, initial encounter: Secondary | ICD-10-CM | POA: Diagnosis not present

## 2016-10-23 DIAGNOSIS — S31819A Unspecified open wound of right buttock, initial encounter: Secondary | ICD-10-CM | POA: Diagnosis not present

## 2016-10-23 DIAGNOSIS — L97526 Non-pressure chronic ulcer of other part of left foot with bone involvement without evidence of necrosis: Secondary | ICD-10-CM | POA: Diagnosis not present

## 2016-10-24 DIAGNOSIS — N186 End stage renal disease: Secondary | ICD-10-CM | POA: Diagnosis not present

## 2016-10-24 DIAGNOSIS — Z23 Encounter for immunization: Secondary | ICD-10-CM | POA: Diagnosis not present

## 2016-10-24 DIAGNOSIS — N2581 Secondary hyperparathyroidism of renal origin: Secondary | ICD-10-CM | POA: Diagnosis not present

## 2016-10-24 DIAGNOSIS — Z992 Dependence on renal dialysis: Secondary | ICD-10-CM | POA: Diagnosis not present

## 2016-10-24 DIAGNOSIS — D631 Anemia in chronic kidney disease: Secondary | ICD-10-CM | POA: Diagnosis not present

## 2016-10-24 DIAGNOSIS — E1122 Type 2 diabetes mellitus with diabetic chronic kidney disease: Secondary | ICD-10-CM | POA: Diagnosis not present

## 2016-10-26 DIAGNOSIS — L89314 Pressure ulcer of right buttock, stage 4: Secondary | ICD-10-CM | POA: Diagnosis not present

## 2016-10-26 DIAGNOSIS — L89222 Pressure ulcer of left hip, stage 2: Secondary | ICD-10-CM | POA: Diagnosis not present

## 2016-10-26 DIAGNOSIS — I120 Hypertensive chronic kidney disease with stage 5 chronic kidney disease or end stage renal disease: Secondary | ICD-10-CM | POA: Diagnosis not present

## 2016-10-26 DIAGNOSIS — N186 End stage renal disease: Secondary | ICD-10-CM | POA: Diagnosis not present

## 2016-10-26 DIAGNOSIS — E1122 Type 2 diabetes mellitus with diabetic chronic kidney disease: Secondary | ICD-10-CM | POA: Diagnosis not present

## 2016-10-26 DIAGNOSIS — G35 Multiple sclerosis: Secondary | ICD-10-CM | POA: Diagnosis not present

## 2016-10-27 DIAGNOSIS — Z23 Encounter for immunization: Secondary | ICD-10-CM | POA: Diagnosis not present

## 2016-10-27 DIAGNOSIS — N186 End stage renal disease: Secondary | ICD-10-CM | POA: Diagnosis not present

## 2016-10-27 DIAGNOSIS — E1122 Type 2 diabetes mellitus with diabetic chronic kidney disease: Secondary | ICD-10-CM | POA: Diagnosis not present

## 2016-10-27 DIAGNOSIS — Z992 Dependence on renal dialysis: Secondary | ICD-10-CM | POA: Diagnosis not present

## 2016-10-27 DIAGNOSIS — N2581 Secondary hyperparathyroidism of renal origin: Secondary | ICD-10-CM | POA: Diagnosis not present

## 2016-10-27 DIAGNOSIS — D631 Anemia in chronic kidney disease: Secondary | ICD-10-CM | POA: Diagnosis not present

## 2016-10-27 LAB — AEROBIC CULTURE W GRAM STAIN (SUPERFICIAL SPECIMEN): Gram Stain: NONE SEEN

## 2016-10-28 DIAGNOSIS — L89314 Pressure ulcer of right buttock, stage 4: Secondary | ICD-10-CM | POA: Diagnosis not present

## 2016-10-28 DIAGNOSIS — I120 Hypertensive chronic kidney disease with stage 5 chronic kidney disease or end stage renal disease: Secondary | ICD-10-CM | POA: Diagnosis not present

## 2016-10-28 DIAGNOSIS — L89222 Pressure ulcer of left hip, stage 2: Secondary | ICD-10-CM | POA: Diagnosis not present

## 2016-10-28 DIAGNOSIS — E1122 Type 2 diabetes mellitus with diabetic chronic kidney disease: Secondary | ICD-10-CM | POA: Diagnosis not present

## 2016-10-28 DIAGNOSIS — G35 Multiple sclerosis: Secondary | ICD-10-CM | POA: Diagnosis not present

## 2016-10-28 DIAGNOSIS — N186 End stage renal disease: Secondary | ICD-10-CM | POA: Diagnosis not present

## 2016-10-29 DIAGNOSIS — D631 Anemia in chronic kidney disease: Secondary | ICD-10-CM | POA: Diagnosis not present

## 2016-10-29 DIAGNOSIS — Z992 Dependence on renal dialysis: Secondary | ICD-10-CM | POA: Diagnosis not present

## 2016-10-29 DIAGNOSIS — E1122 Type 2 diabetes mellitus with diabetic chronic kidney disease: Secondary | ICD-10-CM | POA: Diagnosis not present

## 2016-10-29 DIAGNOSIS — N186 End stage renal disease: Secondary | ICD-10-CM | POA: Diagnosis not present

## 2016-10-29 DIAGNOSIS — N2581 Secondary hyperparathyroidism of renal origin: Secondary | ICD-10-CM | POA: Diagnosis not present

## 2016-10-29 DIAGNOSIS — Z23 Encounter for immunization: Secondary | ICD-10-CM | POA: Diagnosis not present

## 2016-10-30 DIAGNOSIS — G35 Multiple sclerosis: Secondary | ICD-10-CM | POA: Diagnosis not present

## 2016-10-30 DIAGNOSIS — E1122 Type 2 diabetes mellitus with diabetic chronic kidney disease: Secondary | ICD-10-CM | POA: Diagnosis not present

## 2016-10-30 DIAGNOSIS — L89222 Pressure ulcer of left hip, stage 2: Secondary | ICD-10-CM | POA: Diagnosis not present

## 2016-10-30 DIAGNOSIS — L89314 Pressure ulcer of right buttock, stage 4: Secondary | ICD-10-CM | POA: Diagnosis not present

## 2016-10-30 DIAGNOSIS — I120 Hypertensive chronic kidney disease with stage 5 chronic kidney disease or end stage renal disease: Secondary | ICD-10-CM | POA: Diagnosis not present

## 2016-10-30 DIAGNOSIS — N186 End stage renal disease: Secondary | ICD-10-CM | POA: Diagnosis not present

## 2016-10-31 DIAGNOSIS — Z23 Encounter for immunization: Secondary | ICD-10-CM | POA: Diagnosis not present

## 2016-10-31 DIAGNOSIS — N2581 Secondary hyperparathyroidism of renal origin: Secondary | ICD-10-CM | POA: Diagnosis not present

## 2016-10-31 DIAGNOSIS — E1122 Type 2 diabetes mellitus with diabetic chronic kidney disease: Secondary | ICD-10-CM | POA: Diagnosis not present

## 2016-10-31 DIAGNOSIS — Z992 Dependence on renal dialysis: Secondary | ICD-10-CM | POA: Diagnosis not present

## 2016-10-31 DIAGNOSIS — N186 End stage renal disease: Secondary | ICD-10-CM | POA: Diagnosis not present

## 2016-10-31 DIAGNOSIS — D631 Anemia in chronic kidney disease: Secondary | ICD-10-CM | POA: Diagnosis not present

## 2016-11-02 DIAGNOSIS — I120 Hypertensive chronic kidney disease with stage 5 chronic kidney disease or end stage renal disease: Secondary | ICD-10-CM | POA: Diagnosis not present

## 2016-11-02 DIAGNOSIS — E1122 Type 2 diabetes mellitus with diabetic chronic kidney disease: Secondary | ICD-10-CM | POA: Diagnosis not present

## 2016-11-02 DIAGNOSIS — G35 Multiple sclerosis: Secondary | ICD-10-CM | POA: Diagnosis not present

## 2016-11-02 DIAGNOSIS — L89314 Pressure ulcer of right buttock, stage 4: Secondary | ICD-10-CM | POA: Diagnosis not present

## 2016-11-02 DIAGNOSIS — N186 End stage renal disease: Secondary | ICD-10-CM | POA: Diagnosis not present

## 2016-11-02 DIAGNOSIS — L89222 Pressure ulcer of left hip, stage 2: Secondary | ICD-10-CM | POA: Diagnosis not present

## 2016-11-03 DIAGNOSIS — D631 Anemia in chronic kidney disease: Secondary | ICD-10-CM | POA: Diagnosis not present

## 2016-11-03 DIAGNOSIS — N186 End stage renal disease: Secondary | ICD-10-CM | POA: Diagnosis not present

## 2016-11-03 DIAGNOSIS — N2581 Secondary hyperparathyroidism of renal origin: Secondary | ICD-10-CM | POA: Diagnosis not present

## 2016-11-03 DIAGNOSIS — M869 Osteomyelitis, unspecified: Secondary | ICD-10-CM | POA: Diagnosis not present

## 2016-11-03 DIAGNOSIS — Z992 Dependence on renal dialysis: Secondary | ICD-10-CM | POA: Diagnosis not present

## 2016-11-03 DIAGNOSIS — E1122 Type 2 diabetes mellitus with diabetic chronic kidney disease: Secondary | ICD-10-CM | POA: Diagnosis not present

## 2016-11-04 DIAGNOSIS — E1122 Type 2 diabetes mellitus with diabetic chronic kidney disease: Secondary | ICD-10-CM | POA: Diagnosis not present

## 2016-11-04 DIAGNOSIS — L89222 Pressure ulcer of left hip, stage 2: Secondary | ICD-10-CM | POA: Diagnosis not present

## 2016-11-04 DIAGNOSIS — G35 Multiple sclerosis: Secondary | ICD-10-CM | POA: Diagnosis not present

## 2016-11-04 DIAGNOSIS — I120 Hypertensive chronic kidney disease with stage 5 chronic kidney disease or end stage renal disease: Secondary | ICD-10-CM | POA: Diagnosis not present

## 2016-11-04 DIAGNOSIS — L89314 Pressure ulcer of right buttock, stage 4: Secondary | ICD-10-CM | POA: Diagnosis not present

## 2016-11-04 DIAGNOSIS — N186 End stage renal disease: Secondary | ICD-10-CM | POA: Diagnosis not present

## 2016-11-05 DIAGNOSIS — N186 End stage renal disease: Secondary | ICD-10-CM | POA: Diagnosis not present

## 2016-11-05 DIAGNOSIS — M869 Osteomyelitis, unspecified: Secondary | ICD-10-CM | POA: Diagnosis not present

## 2016-11-05 DIAGNOSIS — Z992 Dependence on renal dialysis: Secondary | ICD-10-CM | POA: Diagnosis not present

## 2016-11-05 DIAGNOSIS — D631 Anemia in chronic kidney disease: Secondary | ICD-10-CM | POA: Diagnosis not present

## 2016-11-05 DIAGNOSIS — N2581 Secondary hyperparathyroidism of renal origin: Secondary | ICD-10-CM | POA: Diagnosis not present

## 2016-11-05 DIAGNOSIS — E1122 Type 2 diabetes mellitus with diabetic chronic kidney disease: Secondary | ICD-10-CM | POA: Diagnosis not present

## 2016-11-06 ENCOUNTER — Other Ambulatory Visit: Payer: Self-pay | Admitting: Internal Medicine

## 2016-11-06 ENCOUNTER — Ambulatory Visit (HOSPITAL_COMMUNITY)
Admission: RE | Admit: 2016-11-06 | Discharge: 2016-11-06 | Disposition: A | Discharge status: Home / Self Care | Payer: Medicare Other | Source: Ambulatory Visit | Attending: Internal Medicine | Admitting: Internal Medicine

## 2016-11-06 ENCOUNTER — Encounter (HOSPITAL_BASED_OUTPATIENT_CLINIC_OR_DEPARTMENT_OTHER): Payer: Medicare Other | Attending: Internal Medicine

## 2016-11-06 DIAGNOSIS — I1311 Hypertensive heart and chronic kidney disease without heart failure, with stage 5 chronic kidney disease, or end stage renal disease: Secondary | ICD-10-CM | POA: Insufficient documentation

## 2016-11-06 DIAGNOSIS — Z992 Dependence on renal dialysis: Secondary | ICD-10-CM | POA: Insufficient documentation

## 2016-11-06 DIAGNOSIS — G35 Multiple sclerosis: Secondary | ICD-10-CM | POA: Diagnosis not present

## 2016-11-06 DIAGNOSIS — B952 Enterococcus as the cause of diseases classified elsewhere: Secondary | ICD-10-CM | POA: Insufficient documentation

## 2016-11-06 DIAGNOSIS — L89314 Pressure ulcer of right buttock, stage 4: Secondary | ICD-10-CM | POA: Insufficient documentation

## 2016-11-06 DIAGNOSIS — B9562 Methicillin resistant Staphylococcus aureus infection as the cause of diseases classified elsewhere: Secondary | ICD-10-CM | POA: Diagnosis not present

## 2016-11-06 DIAGNOSIS — M869 Osteomyelitis, unspecified: Secondary | ICD-10-CM

## 2016-11-06 DIAGNOSIS — L97524 Non-pressure chronic ulcer of other part of left foot with necrosis of bone: Secondary | ICD-10-CM | POA: Insufficient documentation

## 2016-11-06 DIAGNOSIS — S91302A Unspecified open wound, left foot, initial encounter: Secondary | ICD-10-CM | POA: Diagnosis not present

## 2016-11-06 DIAGNOSIS — M8668 Other chronic osteomyelitis, other site: Secondary | ICD-10-CM | POA: Diagnosis not present

## 2016-11-06 DIAGNOSIS — L89322 Pressure ulcer of left buttock, stage 2: Secondary | ICD-10-CM | POA: Insufficient documentation

## 2016-11-06 DIAGNOSIS — N189 Chronic kidney disease, unspecified: Secondary | ICD-10-CM | POA: Diagnosis not present

## 2016-11-06 DIAGNOSIS — S31819A Unspecified open wound of right buttock, initial encounter: Secondary | ICD-10-CM | POA: Diagnosis not present

## 2016-11-06 DIAGNOSIS — S31829A Unspecified open wound of left buttock, initial encounter: Secondary | ICD-10-CM | POA: Diagnosis not present

## 2016-11-07 DIAGNOSIS — N186 End stage renal disease: Secondary | ICD-10-CM | POA: Diagnosis not present

## 2016-11-07 DIAGNOSIS — M869 Osteomyelitis, unspecified: Secondary | ICD-10-CM | POA: Diagnosis not present

## 2016-11-07 DIAGNOSIS — D631 Anemia in chronic kidney disease: Secondary | ICD-10-CM | POA: Diagnosis not present

## 2016-11-07 DIAGNOSIS — Z992 Dependence on renal dialysis: Secondary | ICD-10-CM | POA: Diagnosis not present

## 2016-11-07 DIAGNOSIS — E1122 Type 2 diabetes mellitus with diabetic chronic kidney disease: Secondary | ICD-10-CM | POA: Diagnosis not present

## 2016-11-07 DIAGNOSIS — N2581 Secondary hyperparathyroidism of renal origin: Secondary | ICD-10-CM | POA: Diagnosis not present

## 2016-11-09 DIAGNOSIS — N186 End stage renal disease: Secondary | ICD-10-CM | POA: Diagnosis not present

## 2016-11-09 DIAGNOSIS — L89314 Pressure ulcer of right buttock, stage 4: Secondary | ICD-10-CM | POA: Diagnosis not present

## 2016-11-09 DIAGNOSIS — G35 Multiple sclerosis: Secondary | ICD-10-CM | POA: Diagnosis not present

## 2016-11-09 DIAGNOSIS — E1122 Type 2 diabetes mellitus with diabetic chronic kidney disease: Secondary | ICD-10-CM | POA: Diagnosis not present

## 2016-11-09 DIAGNOSIS — I120 Hypertensive chronic kidney disease with stage 5 chronic kidney disease or end stage renal disease: Secondary | ICD-10-CM | POA: Diagnosis not present

## 2016-11-09 DIAGNOSIS — L89222 Pressure ulcer of left hip, stage 2: Secondary | ICD-10-CM | POA: Diagnosis not present

## 2016-11-10 DIAGNOSIS — M869 Osteomyelitis, unspecified: Secondary | ICD-10-CM | POA: Diagnosis not present

## 2016-11-10 DIAGNOSIS — D631 Anemia in chronic kidney disease: Secondary | ICD-10-CM | POA: Diagnosis not present

## 2016-11-10 DIAGNOSIS — N2581 Secondary hyperparathyroidism of renal origin: Secondary | ICD-10-CM | POA: Diagnosis not present

## 2016-11-10 DIAGNOSIS — E1122 Type 2 diabetes mellitus with diabetic chronic kidney disease: Secondary | ICD-10-CM | POA: Diagnosis not present

## 2016-11-10 DIAGNOSIS — N186 End stage renal disease: Secondary | ICD-10-CM | POA: Diagnosis not present

## 2016-11-10 DIAGNOSIS — Z992 Dependence on renal dialysis: Secondary | ICD-10-CM | POA: Diagnosis not present

## 2016-11-11 DIAGNOSIS — G35 Multiple sclerosis: Secondary | ICD-10-CM | POA: Diagnosis not present

## 2016-11-11 DIAGNOSIS — E1122 Type 2 diabetes mellitus with diabetic chronic kidney disease: Secondary | ICD-10-CM | POA: Diagnosis not present

## 2016-11-11 DIAGNOSIS — I120 Hypertensive chronic kidney disease with stage 5 chronic kidney disease or end stage renal disease: Secondary | ICD-10-CM | POA: Diagnosis not present

## 2016-11-11 DIAGNOSIS — N186 End stage renal disease: Secondary | ICD-10-CM | POA: Diagnosis not present

## 2016-11-11 DIAGNOSIS — L89314 Pressure ulcer of right buttock, stage 4: Secondary | ICD-10-CM | POA: Diagnosis not present

## 2016-11-11 DIAGNOSIS — L89222 Pressure ulcer of left hip, stage 2: Secondary | ICD-10-CM | POA: Diagnosis not present

## 2016-11-12 DIAGNOSIS — N186 End stage renal disease: Secondary | ICD-10-CM | POA: Diagnosis not present

## 2016-11-12 DIAGNOSIS — N2581 Secondary hyperparathyroidism of renal origin: Secondary | ICD-10-CM | POA: Diagnosis not present

## 2016-11-12 DIAGNOSIS — D631 Anemia in chronic kidney disease: Secondary | ICD-10-CM | POA: Diagnosis not present

## 2016-11-12 DIAGNOSIS — M869 Osteomyelitis, unspecified: Secondary | ICD-10-CM | POA: Diagnosis not present

## 2016-11-12 DIAGNOSIS — E1122 Type 2 diabetes mellitus with diabetic chronic kidney disease: Secondary | ICD-10-CM | POA: Diagnosis not present

## 2016-11-12 DIAGNOSIS — Z992 Dependence on renal dialysis: Secondary | ICD-10-CM | POA: Diagnosis not present

## 2016-11-13 DIAGNOSIS — L89314 Pressure ulcer of right buttock, stage 4: Secondary | ICD-10-CM | POA: Diagnosis not present

## 2016-11-13 DIAGNOSIS — L89222 Pressure ulcer of left hip, stage 2: Secondary | ICD-10-CM | POA: Diagnosis not present

## 2016-11-13 DIAGNOSIS — G35 Multiple sclerosis: Secondary | ICD-10-CM | POA: Diagnosis not present

## 2016-11-13 DIAGNOSIS — I120 Hypertensive chronic kidney disease with stage 5 chronic kidney disease or end stage renal disease: Secondary | ICD-10-CM | POA: Diagnosis not present

## 2016-11-13 DIAGNOSIS — E1122 Type 2 diabetes mellitus with diabetic chronic kidney disease: Secondary | ICD-10-CM | POA: Diagnosis not present

## 2016-11-13 DIAGNOSIS — N186 End stage renal disease: Secondary | ICD-10-CM | POA: Diagnosis not present

## 2016-11-14 DIAGNOSIS — E1122 Type 2 diabetes mellitus with diabetic chronic kidney disease: Secondary | ICD-10-CM | POA: Diagnosis not present

## 2016-11-14 DIAGNOSIS — M869 Osteomyelitis, unspecified: Secondary | ICD-10-CM | POA: Diagnosis not present

## 2016-11-14 DIAGNOSIS — D631 Anemia in chronic kidney disease: Secondary | ICD-10-CM | POA: Diagnosis not present

## 2016-11-14 DIAGNOSIS — N186 End stage renal disease: Secondary | ICD-10-CM | POA: Diagnosis not present

## 2016-11-14 DIAGNOSIS — Z992 Dependence on renal dialysis: Secondary | ICD-10-CM | POA: Diagnosis not present

## 2016-11-14 DIAGNOSIS — N2581 Secondary hyperparathyroidism of renal origin: Secondary | ICD-10-CM | POA: Diagnosis not present

## 2016-11-16 DIAGNOSIS — G35 Multiple sclerosis: Secondary | ICD-10-CM | POA: Diagnosis not present

## 2016-11-16 DIAGNOSIS — L89314 Pressure ulcer of right buttock, stage 4: Secondary | ICD-10-CM | POA: Diagnosis not present

## 2016-11-16 DIAGNOSIS — I120 Hypertensive chronic kidney disease with stage 5 chronic kidney disease or end stage renal disease: Secondary | ICD-10-CM | POA: Diagnosis not present

## 2016-11-16 DIAGNOSIS — E1122 Type 2 diabetes mellitus with diabetic chronic kidney disease: Secondary | ICD-10-CM | POA: Diagnosis not present

## 2016-11-16 DIAGNOSIS — L89222 Pressure ulcer of left hip, stage 2: Secondary | ICD-10-CM | POA: Diagnosis not present

## 2016-11-16 DIAGNOSIS — N186 End stage renal disease: Secondary | ICD-10-CM | POA: Diagnosis not present

## 2016-11-17 DIAGNOSIS — G35 Multiple sclerosis: Secondary | ICD-10-CM | POA: Diagnosis not present

## 2016-11-17 DIAGNOSIS — L97422 Non-pressure chronic ulcer of left heel and midfoot with fat layer exposed: Secondary | ICD-10-CM | POA: Diagnosis not present

## 2016-11-17 DIAGNOSIS — L89222 Pressure ulcer of left hip, stage 2: Secondary | ICD-10-CM | POA: Diagnosis not present

## 2016-11-17 DIAGNOSIS — M869 Osteomyelitis, unspecified: Secondary | ICD-10-CM | POA: Diagnosis not present

## 2016-11-17 DIAGNOSIS — N186 End stage renal disease: Secondary | ICD-10-CM | POA: Diagnosis not present

## 2016-11-17 DIAGNOSIS — E785 Hyperlipidemia, unspecified: Secondary | ICD-10-CM | POA: Diagnosis not present

## 2016-11-17 DIAGNOSIS — E11621 Type 2 diabetes mellitus with foot ulcer: Secondary | ICD-10-CM | POA: Diagnosis not present

## 2016-11-17 DIAGNOSIS — N319 Neuromuscular dysfunction of bladder, unspecified: Secondary | ICD-10-CM | POA: Diagnosis not present

## 2016-11-17 DIAGNOSIS — I120 Hypertensive chronic kidney disease with stage 5 chronic kidney disease or end stage renal disease: Secondary | ICD-10-CM | POA: Diagnosis not present

## 2016-11-17 DIAGNOSIS — L89314 Pressure ulcer of right buttock, stage 4: Secondary | ICD-10-CM | POA: Diagnosis not present

## 2016-11-17 DIAGNOSIS — Z466 Encounter for fitting and adjustment of urinary device: Secondary | ICD-10-CM | POA: Diagnosis not present

## 2016-11-17 DIAGNOSIS — Z992 Dependence on renal dialysis: Secondary | ICD-10-CM | POA: Diagnosis not present

## 2016-11-17 DIAGNOSIS — Z794 Long term (current) use of insulin: Secondary | ICD-10-CM | POA: Diagnosis not present

## 2016-11-17 DIAGNOSIS — R1312 Dysphagia, oropharyngeal phase: Secondary | ICD-10-CM | POA: Diagnosis not present

## 2016-11-17 DIAGNOSIS — I429 Cardiomyopathy, unspecified: Secondary | ICD-10-CM | POA: Diagnosis not present

## 2016-11-17 DIAGNOSIS — E1122 Type 2 diabetes mellitus with diabetic chronic kidney disease: Secondary | ICD-10-CM | POA: Diagnosis not present

## 2016-11-17 DIAGNOSIS — Z8701 Personal history of pneumonia (recurrent): Secondary | ICD-10-CM | POA: Diagnosis not present

## 2016-11-17 DIAGNOSIS — N2581 Secondary hyperparathyroidism of renal origin: Secondary | ICD-10-CM | POA: Diagnosis not present

## 2016-11-17 DIAGNOSIS — D638 Anemia in other chronic diseases classified elsewhere: Secondary | ICD-10-CM | POA: Diagnosis not present

## 2016-11-17 DIAGNOSIS — D631 Anemia in chronic kidney disease: Secondary | ICD-10-CM | POA: Diagnosis not present

## 2016-11-17 DIAGNOSIS — Z87440 Personal history of urinary (tract) infections: Secondary | ICD-10-CM | POA: Diagnosis not present

## 2016-11-18 DIAGNOSIS — G35 Multiple sclerosis: Secondary | ICD-10-CM | POA: Diagnosis not present

## 2016-11-18 DIAGNOSIS — L89314 Pressure ulcer of right buttock, stage 4: Secondary | ICD-10-CM | POA: Diagnosis not present

## 2016-11-18 DIAGNOSIS — E11621 Type 2 diabetes mellitus with foot ulcer: Secondary | ICD-10-CM | POA: Diagnosis not present

## 2016-11-18 DIAGNOSIS — L89222 Pressure ulcer of left hip, stage 2: Secondary | ICD-10-CM | POA: Diagnosis not present

## 2016-11-18 DIAGNOSIS — I120 Hypertensive chronic kidney disease with stage 5 chronic kidney disease or end stage renal disease: Secondary | ICD-10-CM | POA: Diagnosis not present

## 2016-11-18 DIAGNOSIS — L97422 Non-pressure chronic ulcer of left heel and midfoot with fat layer exposed: Secondary | ICD-10-CM | POA: Diagnosis not present

## 2016-11-19 DIAGNOSIS — N186 End stage renal disease: Secondary | ICD-10-CM | POA: Diagnosis not present

## 2016-11-19 DIAGNOSIS — Z992 Dependence on renal dialysis: Secondary | ICD-10-CM | POA: Diagnosis not present

## 2016-11-19 DIAGNOSIS — E1122 Type 2 diabetes mellitus with diabetic chronic kidney disease: Secondary | ICD-10-CM | POA: Diagnosis not present

## 2016-11-19 DIAGNOSIS — M869 Osteomyelitis, unspecified: Secondary | ICD-10-CM | POA: Diagnosis not present

## 2016-11-19 DIAGNOSIS — N2581 Secondary hyperparathyroidism of renal origin: Secondary | ICD-10-CM | POA: Diagnosis not present

## 2016-11-19 DIAGNOSIS — D631 Anemia in chronic kidney disease: Secondary | ICD-10-CM | POA: Diagnosis not present

## 2016-11-20 DIAGNOSIS — G35 Multiple sclerosis: Secondary | ICD-10-CM | POA: Diagnosis not present

## 2016-11-20 DIAGNOSIS — S31819A Unspecified open wound of right buttock, initial encounter: Secondary | ICD-10-CM | POA: Diagnosis not present

## 2016-11-20 DIAGNOSIS — L89322 Pressure ulcer of left buttock, stage 2: Secondary | ICD-10-CM | POA: Diagnosis not present

## 2016-11-20 DIAGNOSIS — I1311 Hypertensive heart and chronic kidney disease without heart failure, with stage 5 chronic kidney disease, or end stage renal disease: Secondary | ICD-10-CM | POA: Diagnosis not present

## 2016-11-20 DIAGNOSIS — S91302A Unspecified open wound, left foot, initial encounter: Secondary | ICD-10-CM | POA: Diagnosis not present

## 2016-11-20 DIAGNOSIS — S31829A Unspecified open wound of left buttock, initial encounter: Secondary | ICD-10-CM | POA: Diagnosis not present

## 2016-11-20 DIAGNOSIS — M8668 Other chronic osteomyelitis, other site: Secondary | ICD-10-CM | POA: Diagnosis not present

## 2016-11-20 DIAGNOSIS — L89314 Pressure ulcer of right buttock, stage 4: Secondary | ICD-10-CM | POA: Diagnosis not present

## 2016-11-20 DIAGNOSIS — L97524 Non-pressure chronic ulcer of other part of left foot with necrosis of bone: Secondary | ICD-10-CM | POA: Diagnosis not present

## 2016-11-20 DIAGNOSIS — M869 Osteomyelitis, unspecified: Secondary | ICD-10-CM | POA: Diagnosis not present

## 2016-11-21 DIAGNOSIS — M869 Osteomyelitis, unspecified: Secondary | ICD-10-CM | POA: Diagnosis not present

## 2016-11-21 DIAGNOSIS — N186 End stage renal disease: Secondary | ICD-10-CM | POA: Diagnosis not present

## 2016-11-21 DIAGNOSIS — N2581 Secondary hyperparathyroidism of renal origin: Secondary | ICD-10-CM | POA: Diagnosis not present

## 2016-11-21 DIAGNOSIS — Z992 Dependence on renal dialysis: Secondary | ICD-10-CM | POA: Diagnosis not present

## 2016-11-21 DIAGNOSIS — D631 Anemia in chronic kidney disease: Secondary | ICD-10-CM | POA: Diagnosis not present

## 2016-11-21 DIAGNOSIS — E1122 Type 2 diabetes mellitus with diabetic chronic kidney disease: Secondary | ICD-10-CM | POA: Diagnosis not present

## 2016-11-23 DIAGNOSIS — L89314 Pressure ulcer of right buttock, stage 4: Secondary | ICD-10-CM | POA: Diagnosis not present

## 2016-11-23 DIAGNOSIS — L89222 Pressure ulcer of left hip, stage 2: Secondary | ICD-10-CM | POA: Diagnosis not present

## 2016-11-23 DIAGNOSIS — I120 Hypertensive chronic kidney disease with stage 5 chronic kidney disease or end stage renal disease: Secondary | ICD-10-CM | POA: Diagnosis not present

## 2016-11-23 DIAGNOSIS — L97422 Non-pressure chronic ulcer of left heel and midfoot with fat layer exposed: Secondary | ICD-10-CM | POA: Diagnosis not present

## 2016-11-23 DIAGNOSIS — G35 Multiple sclerosis: Secondary | ICD-10-CM | POA: Diagnosis not present

## 2016-11-23 DIAGNOSIS — E11621 Type 2 diabetes mellitus with foot ulcer: Secondary | ICD-10-CM | POA: Diagnosis not present

## 2016-11-24 DIAGNOSIS — D631 Anemia in chronic kidney disease: Secondary | ICD-10-CM | POA: Diagnosis not present

## 2016-11-24 DIAGNOSIS — E1122 Type 2 diabetes mellitus with diabetic chronic kidney disease: Secondary | ICD-10-CM | POA: Diagnosis not present

## 2016-11-24 DIAGNOSIS — N186 End stage renal disease: Secondary | ICD-10-CM | POA: Diagnosis not present

## 2016-11-24 DIAGNOSIS — Z992 Dependence on renal dialysis: Secondary | ICD-10-CM | POA: Diagnosis not present

## 2016-11-24 DIAGNOSIS — M869 Osteomyelitis, unspecified: Secondary | ICD-10-CM | POA: Diagnosis not present

## 2016-11-24 DIAGNOSIS — N2581 Secondary hyperparathyroidism of renal origin: Secondary | ICD-10-CM | POA: Diagnosis not present

## 2016-11-25 DIAGNOSIS — I120 Hypertensive chronic kidney disease with stage 5 chronic kidney disease or end stage renal disease: Secondary | ICD-10-CM | POA: Diagnosis not present

## 2016-11-25 DIAGNOSIS — L89314 Pressure ulcer of right buttock, stage 4: Secondary | ICD-10-CM | POA: Diagnosis not present

## 2016-11-25 DIAGNOSIS — L97422 Non-pressure chronic ulcer of left heel and midfoot with fat layer exposed: Secondary | ICD-10-CM | POA: Diagnosis not present

## 2016-11-25 DIAGNOSIS — E11621 Type 2 diabetes mellitus with foot ulcer: Secondary | ICD-10-CM | POA: Diagnosis not present

## 2016-11-25 DIAGNOSIS — L89222 Pressure ulcer of left hip, stage 2: Secondary | ICD-10-CM | POA: Diagnosis not present

## 2016-11-25 DIAGNOSIS — G35 Multiple sclerosis: Secondary | ICD-10-CM | POA: Diagnosis not present

## 2016-11-26 DIAGNOSIS — N2581 Secondary hyperparathyroidism of renal origin: Secondary | ICD-10-CM | POA: Diagnosis not present

## 2016-11-26 DIAGNOSIS — E1122 Type 2 diabetes mellitus with diabetic chronic kidney disease: Secondary | ICD-10-CM | POA: Diagnosis not present

## 2016-11-26 DIAGNOSIS — M869 Osteomyelitis, unspecified: Secondary | ICD-10-CM | POA: Diagnosis not present

## 2016-11-26 DIAGNOSIS — N186 End stage renal disease: Secondary | ICD-10-CM | POA: Diagnosis not present

## 2016-11-26 DIAGNOSIS — Z992 Dependence on renal dialysis: Secondary | ICD-10-CM | POA: Diagnosis not present

## 2016-11-26 DIAGNOSIS — D631 Anemia in chronic kidney disease: Secondary | ICD-10-CM | POA: Diagnosis not present

## 2016-11-27 DIAGNOSIS — I120 Hypertensive chronic kidney disease with stage 5 chronic kidney disease or end stage renal disease: Secondary | ICD-10-CM | POA: Diagnosis not present

## 2016-11-27 DIAGNOSIS — G35 Multiple sclerosis: Secondary | ICD-10-CM | POA: Diagnosis not present

## 2016-11-27 DIAGNOSIS — L97422 Non-pressure chronic ulcer of left heel and midfoot with fat layer exposed: Secondary | ICD-10-CM | POA: Diagnosis not present

## 2016-11-27 DIAGNOSIS — E11621 Type 2 diabetes mellitus with foot ulcer: Secondary | ICD-10-CM | POA: Diagnosis not present

## 2016-11-27 DIAGNOSIS — L89222 Pressure ulcer of left hip, stage 2: Secondary | ICD-10-CM | POA: Diagnosis not present

## 2016-11-27 DIAGNOSIS — L89314 Pressure ulcer of right buttock, stage 4: Secondary | ICD-10-CM | POA: Diagnosis not present

## 2016-11-28 DIAGNOSIS — E1122 Type 2 diabetes mellitus with diabetic chronic kidney disease: Secondary | ICD-10-CM | POA: Diagnosis not present

## 2016-11-28 DIAGNOSIS — Z992 Dependence on renal dialysis: Secondary | ICD-10-CM | POA: Diagnosis not present

## 2016-11-28 DIAGNOSIS — D631 Anemia in chronic kidney disease: Secondary | ICD-10-CM | POA: Diagnosis not present

## 2016-11-28 DIAGNOSIS — N186 End stage renal disease: Secondary | ICD-10-CM | POA: Diagnosis not present

## 2016-11-28 DIAGNOSIS — M869 Osteomyelitis, unspecified: Secondary | ICD-10-CM | POA: Diagnosis not present

## 2016-11-28 DIAGNOSIS — N2581 Secondary hyperparathyroidism of renal origin: Secondary | ICD-10-CM | POA: Diagnosis not present

## 2016-11-30 DIAGNOSIS — L97422 Non-pressure chronic ulcer of left heel and midfoot with fat layer exposed: Secondary | ICD-10-CM | POA: Diagnosis not present

## 2016-11-30 DIAGNOSIS — L89222 Pressure ulcer of left hip, stage 2: Secondary | ICD-10-CM | POA: Diagnosis not present

## 2016-11-30 DIAGNOSIS — E11621 Type 2 diabetes mellitus with foot ulcer: Secondary | ICD-10-CM | POA: Diagnosis not present

## 2016-11-30 DIAGNOSIS — G35 Multiple sclerosis: Secondary | ICD-10-CM | POA: Diagnosis not present

## 2016-11-30 DIAGNOSIS — I120 Hypertensive chronic kidney disease with stage 5 chronic kidney disease or end stage renal disease: Secondary | ICD-10-CM | POA: Diagnosis not present

## 2016-11-30 DIAGNOSIS — L89314 Pressure ulcer of right buttock, stage 4: Secondary | ICD-10-CM | POA: Diagnosis not present

## 2016-12-01 DIAGNOSIS — N186 End stage renal disease: Secondary | ICD-10-CM | POA: Diagnosis not present

## 2016-12-01 DIAGNOSIS — E1122 Type 2 diabetes mellitus with diabetic chronic kidney disease: Secondary | ICD-10-CM | POA: Diagnosis not present

## 2016-12-01 DIAGNOSIS — M869 Osteomyelitis, unspecified: Secondary | ICD-10-CM | POA: Diagnosis not present

## 2016-12-01 DIAGNOSIS — Z992 Dependence on renal dialysis: Secondary | ICD-10-CM | POA: Diagnosis not present

## 2016-12-01 DIAGNOSIS — D631 Anemia in chronic kidney disease: Secondary | ICD-10-CM | POA: Diagnosis not present

## 2016-12-01 DIAGNOSIS — N2581 Secondary hyperparathyroidism of renal origin: Secondary | ICD-10-CM | POA: Diagnosis not present

## 2016-12-02 DIAGNOSIS — G35 Multiple sclerosis: Secondary | ICD-10-CM | POA: Diagnosis not present

## 2016-12-02 DIAGNOSIS — I120 Hypertensive chronic kidney disease with stage 5 chronic kidney disease or end stage renal disease: Secondary | ICD-10-CM | POA: Diagnosis not present

## 2016-12-02 DIAGNOSIS — L97422 Non-pressure chronic ulcer of left heel and midfoot with fat layer exposed: Secondary | ICD-10-CM | POA: Diagnosis not present

## 2016-12-02 DIAGNOSIS — L89314 Pressure ulcer of right buttock, stage 4: Secondary | ICD-10-CM | POA: Diagnosis not present

## 2016-12-02 DIAGNOSIS — L89222 Pressure ulcer of left hip, stage 2: Secondary | ICD-10-CM | POA: Diagnosis not present

## 2016-12-02 DIAGNOSIS — E11621 Type 2 diabetes mellitus with foot ulcer: Secondary | ICD-10-CM | POA: Diagnosis not present

## 2016-12-03 DIAGNOSIS — Z992 Dependence on renal dialysis: Secondary | ICD-10-CM | POA: Diagnosis not present

## 2016-12-03 DIAGNOSIS — N186 End stage renal disease: Secondary | ICD-10-CM | POA: Diagnosis not present

## 2016-12-03 DIAGNOSIS — M869 Osteomyelitis, unspecified: Secondary | ICD-10-CM | POA: Diagnosis not present

## 2016-12-03 DIAGNOSIS — D631 Anemia in chronic kidney disease: Secondary | ICD-10-CM | POA: Diagnosis not present

## 2016-12-03 DIAGNOSIS — E1122 Type 2 diabetes mellitus with diabetic chronic kidney disease: Secondary | ICD-10-CM | POA: Diagnosis not present

## 2016-12-03 DIAGNOSIS — N2581 Secondary hyperparathyroidism of renal origin: Secondary | ICD-10-CM | POA: Diagnosis not present

## 2016-12-04 DIAGNOSIS — E11621 Type 2 diabetes mellitus with foot ulcer: Secondary | ICD-10-CM | POA: Diagnosis not present

## 2016-12-04 DIAGNOSIS — G35 Multiple sclerosis: Secondary | ICD-10-CM | POA: Diagnosis not present

## 2016-12-04 DIAGNOSIS — L89222 Pressure ulcer of left hip, stage 2: Secondary | ICD-10-CM | POA: Diagnosis not present

## 2016-12-04 DIAGNOSIS — I120 Hypertensive chronic kidney disease with stage 5 chronic kidney disease or end stage renal disease: Secondary | ICD-10-CM | POA: Diagnosis not present

## 2016-12-04 DIAGNOSIS — L97422 Non-pressure chronic ulcer of left heel and midfoot with fat layer exposed: Secondary | ICD-10-CM | POA: Diagnosis not present

## 2016-12-04 DIAGNOSIS — L89314 Pressure ulcer of right buttock, stage 4: Secondary | ICD-10-CM | POA: Diagnosis not present

## 2016-12-05 DIAGNOSIS — E1122 Type 2 diabetes mellitus with diabetic chronic kidney disease: Secondary | ICD-10-CM | POA: Diagnosis not present

## 2016-12-05 DIAGNOSIS — Z992 Dependence on renal dialysis: Secondary | ICD-10-CM | POA: Diagnosis not present

## 2016-12-05 DIAGNOSIS — D631 Anemia in chronic kidney disease: Secondary | ICD-10-CM | POA: Diagnosis not present

## 2016-12-05 DIAGNOSIS — N186 End stage renal disease: Secondary | ICD-10-CM | POA: Diagnosis not present

## 2016-12-05 DIAGNOSIS — N2581 Secondary hyperparathyroidism of renal origin: Secondary | ICD-10-CM | POA: Diagnosis not present

## 2016-12-05 DIAGNOSIS — M869 Osteomyelitis, unspecified: Secondary | ICD-10-CM | POA: Diagnosis not present

## 2016-12-07 DIAGNOSIS — G35 Multiple sclerosis: Secondary | ICD-10-CM | POA: Diagnosis not present

## 2016-12-07 DIAGNOSIS — E11621 Type 2 diabetes mellitus with foot ulcer: Secondary | ICD-10-CM | POA: Diagnosis not present

## 2016-12-07 DIAGNOSIS — L97422 Non-pressure chronic ulcer of left heel and midfoot with fat layer exposed: Secondary | ICD-10-CM | POA: Diagnosis not present

## 2016-12-07 DIAGNOSIS — L89314 Pressure ulcer of right buttock, stage 4: Secondary | ICD-10-CM | POA: Diagnosis not present

## 2016-12-07 DIAGNOSIS — I120 Hypertensive chronic kidney disease with stage 5 chronic kidney disease or end stage renal disease: Secondary | ICD-10-CM | POA: Diagnosis not present

## 2016-12-07 DIAGNOSIS — L89222 Pressure ulcer of left hip, stage 2: Secondary | ICD-10-CM | POA: Diagnosis not present

## 2016-12-08 DIAGNOSIS — D631 Anemia in chronic kidney disease: Secondary | ICD-10-CM | POA: Diagnosis not present

## 2016-12-08 DIAGNOSIS — N186 End stage renal disease: Secondary | ICD-10-CM | POA: Diagnosis not present

## 2016-12-08 DIAGNOSIS — M869 Osteomyelitis, unspecified: Secondary | ICD-10-CM | POA: Diagnosis not present

## 2016-12-08 DIAGNOSIS — N2581 Secondary hyperparathyroidism of renal origin: Secondary | ICD-10-CM | POA: Diagnosis not present

## 2016-12-08 DIAGNOSIS — Z992 Dependence on renal dialysis: Secondary | ICD-10-CM | POA: Diagnosis not present

## 2016-12-08 DIAGNOSIS — E1122 Type 2 diabetes mellitus with diabetic chronic kidney disease: Secondary | ICD-10-CM | POA: Diagnosis not present

## 2016-12-09 DIAGNOSIS — E11621 Type 2 diabetes mellitus with foot ulcer: Secondary | ICD-10-CM | POA: Diagnosis not present

## 2016-12-09 DIAGNOSIS — I120 Hypertensive chronic kidney disease with stage 5 chronic kidney disease or end stage renal disease: Secondary | ICD-10-CM | POA: Diagnosis not present

## 2016-12-09 DIAGNOSIS — G35 Multiple sclerosis: Secondary | ICD-10-CM | POA: Diagnosis not present

## 2016-12-09 DIAGNOSIS — L97422 Non-pressure chronic ulcer of left heel and midfoot with fat layer exposed: Secondary | ICD-10-CM | POA: Diagnosis not present

## 2016-12-09 DIAGNOSIS — L89222 Pressure ulcer of left hip, stage 2: Secondary | ICD-10-CM | POA: Diagnosis not present

## 2016-12-09 DIAGNOSIS — L89314 Pressure ulcer of right buttock, stage 4: Secondary | ICD-10-CM | POA: Diagnosis not present

## 2016-12-10 DIAGNOSIS — M869 Osteomyelitis, unspecified: Secondary | ICD-10-CM | POA: Diagnosis not present

## 2016-12-10 DIAGNOSIS — N2581 Secondary hyperparathyroidism of renal origin: Secondary | ICD-10-CM | POA: Diagnosis not present

## 2016-12-10 DIAGNOSIS — Z992 Dependence on renal dialysis: Secondary | ICD-10-CM | POA: Diagnosis not present

## 2016-12-10 DIAGNOSIS — N186 End stage renal disease: Secondary | ICD-10-CM | POA: Diagnosis not present

## 2016-12-10 DIAGNOSIS — D631 Anemia in chronic kidney disease: Secondary | ICD-10-CM | POA: Diagnosis not present

## 2016-12-10 DIAGNOSIS — E1122 Type 2 diabetes mellitus with diabetic chronic kidney disease: Secondary | ICD-10-CM | POA: Diagnosis not present

## 2016-12-11 DIAGNOSIS — L97422 Non-pressure chronic ulcer of left heel and midfoot with fat layer exposed: Secondary | ICD-10-CM | POA: Diagnosis not present

## 2016-12-11 DIAGNOSIS — L89222 Pressure ulcer of left hip, stage 2: Secondary | ICD-10-CM | POA: Diagnosis not present

## 2016-12-11 DIAGNOSIS — E11621 Type 2 diabetes mellitus with foot ulcer: Secondary | ICD-10-CM | POA: Diagnosis not present

## 2016-12-11 DIAGNOSIS — L89314 Pressure ulcer of right buttock, stage 4: Secondary | ICD-10-CM | POA: Diagnosis not present

## 2016-12-11 DIAGNOSIS — I120 Hypertensive chronic kidney disease with stage 5 chronic kidney disease or end stage renal disease: Secondary | ICD-10-CM | POA: Diagnosis not present

## 2016-12-11 DIAGNOSIS — G35 Multiple sclerosis: Secondary | ICD-10-CM | POA: Diagnosis not present

## 2016-12-12 DIAGNOSIS — E1122 Type 2 diabetes mellitus with diabetic chronic kidney disease: Secondary | ICD-10-CM | POA: Diagnosis not present

## 2016-12-12 DIAGNOSIS — N186 End stage renal disease: Secondary | ICD-10-CM | POA: Diagnosis not present

## 2016-12-12 DIAGNOSIS — M869 Osteomyelitis, unspecified: Secondary | ICD-10-CM | POA: Diagnosis not present

## 2016-12-12 DIAGNOSIS — N2581 Secondary hyperparathyroidism of renal origin: Secondary | ICD-10-CM | POA: Diagnosis not present

## 2016-12-12 DIAGNOSIS — Z992 Dependence on renal dialysis: Secondary | ICD-10-CM | POA: Diagnosis not present

## 2016-12-12 DIAGNOSIS — D631 Anemia in chronic kidney disease: Secondary | ICD-10-CM | POA: Diagnosis not present

## 2016-12-14 DIAGNOSIS — L97422 Non-pressure chronic ulcer of left heel and midfoot with fat layer exposed: Secondary | ICD-10-CM | POA: Diagnosis not present

## 2016-12-14 DIAGNOSIS — E11621 Type 2 diabetes mellitus with foot ulcer: Secondary | ICD-10-CM | POA: Diagnosis not present

## 2016-12-14 DIAGNOSIS — L89222 Pressure ulcer of left hip, stage 2: Secondary | ICD-10-CM | POA: Diagnosis not present

## 2016-12-14 DIAGNOSIS — G35 Multiple sclerosis: Secondary | ICD-10-CM | POA: Diagnosis not present

## 2016-12-14 DIAGNOSIS — I120 Hypertensive chronic kidney disease with stage 5 chronic kidney disease or end stage renal disease: Secondary | ICD-10-CM | POA: Diagnosis not present

## 2016-12-14 DIAGNOSIS — L89314 Pressure ulcer of right buttock, stage 4: Secondary | ICD-10-CM | POA: Diagnosis not present

## 2016-12-15 DIAGNOSIS — N186 End stage renal disease: Secondary | ICD-10-CM | POA: Diagnosis not present

## 2016-12-15 DIAGNOSIS — E1122 Type 2 diabetes mellitus with diabetic chronic kidney disease: Secondary | ICD-10-CM | POA: Diagnosis not present

## 2016-12-15 DIAGNOSIS — M869 Osteomyelitis, unspecified: Secondary | ICD-10-CM | POA: Diagnosis not present

## 2016-12-15 DIAGNOSIS — N2581 Secondary hyperparathyroidism of renal origin: Secondary | ICD-10-CM | POA: Diagnosis not present

## 2016-12-15 DIAGNOSIS — Z992 Dependence on renal dialysis: Secondary | ICD-10-CM | POA: Diagnosis not present

## 2016-12-15 DIAGNOSIS — D631 Anemia in chronic kidney disease: Secondary | ICD-10-CM | POA: Diagnosis not present

## 2016-12-16 DIAGNOSIS — E11621 Type 2 diabetes mellitus with foot ulcer: Secondary | ICD-10-CM | POA: Diagnosis not present

## 2016-12-16 DIAGNOSIS — I120 Hypertensive chronic kidney disease with stage 5 chronic kidney disease or end stage renal disease: Secondary | ICD-10-CM | POA: Diagnosis not present

## 2016-12-16 DIAGNOSIS — L89314 Pressure ulcer of right buttock, stage 4: Secondary | ICD-10-CM | POA: Diagnosis not present

## 2016-12-16 DIAGNOSIS — L97422 Non-pressure chronic ulcer of left heel and midfoot with fat layer exposed: Secondary | ICD-10-CM | POA: Diagnosis not present

## 2016-12-16 DIAGNOSIS — G35 Multiple sclerosis: Secondary | ICD-10-CM | POA: Diagnosis not present

## 2016-12-16 DIAGNOSIS — L89222 Pressure ulcer of left hip, stage 2: Secondary | ICD-10-CM | POA: Diagnosis not present

## 2016-12-17 DIAGNOSIS — D631 Anemia in chronic kidney disease: Secondary | ICD-10-CM | POA: Diagnosis not present

## 2016-12-17 DIAGNOSIS — E1122 Type 2 diabetes mellitus with diabetic chronic kidney disease: Secondary | ICD-10-CM | POA: Diagnosis not present

## 2016-12-17 DIAGNOSIS — M869 Osteomyelitis, unspecified: Secondary | ICD-10-CM | POA: Diagnosis not present

## 2016-12-17 DIAGNOSIS — Z992 Dependence on renal dialysis: Secondary | ICD-10-CM | POA: Diagnosis not present

## 2016-12-17 DIAGNOSIS — N2581 Secondary hyperparathyroidism of renal origin: Secondary | ICD-10-CM | POA: Diagnosis not present

## 2016-12-17 DIAGNOSIS — N186 End stage renal disease: Secondary | ICD-10-CM | POA: Diagnosis not present

## 2016-12-18 ENCOUNTER — Encounter (HOSPITAL_BASED_OUTPATIENT_CLINIC_OR_DEPARTMENT_OTHER): Payer: Medicare Other | Attending: Internal Medicine

## 2016-12-18 DIAGNOSIS — I120 Hypertensive chronic kidney disease with stage 5 chronic kidney disease or end stage renal disease: Secondary | ICD-10-CM | POA: Diagnosis not present

## 2016-12-18 DIAGNOSIS — Z992 Dependence on renal dialysis: Secondary | ICD-10-CM | POA: Diagnosis not present

## 2016-12-18 DIAGNOSIS — M869 Osteomyelitis, unspecified: Secondary | ICD-10-CM | POA: Diagnosis not present

## 2016-12-18 DIAGNOSIS — E1122 Type 2 diabetes mellitus with diabetic chronic kidney disease: Secondary | ICD-10-CM | POA: Insufficient documentation

## 2016-12-18 DIAGNOSIS — E1169 Type 2 diabetes mellitus with other specified complication: Secondary | ICD-10-CM | POA: Diagnosis not present

## 2016-12-18 DIAGNOSIS — L97526 Non-pressure chronic ulcer of other part of left foot with bone involvement without evidence of necrosis: Secondary | ICD-10-CM | POA: Diagnosis not present

## 2016-12-18 DIAGNOSIS — N186 End stage renal disease: Secondary | ICD-10-CM | POA: Diagnosis not present

## 2016-12-18 DIAGNOSIS — G35 Multiple sclerosis: Secondary | ICD-10-CM | POA: Diagnosis not present

## 2016-12-18 DIAGNOSIS — G8220 Paraplegia, unspecified: Secondary | ICD-10-CM | POA: Insufficient documentation

## 2016-12-18 DIAGNOSIS — L89314 Pressure ulcer of right buttock, stage 4: Secondary | ICD-10-CM | POA: Diagnosis not present

## 2016-12-18 DIAGNOSIS — E11621 Type 2 diabetes mellitus with foot ulcer: Secondary | ICD-10-CM | POA: Insufficient documentation

## 2016-12-19 DIAGNOSIS — M869 Osteomyelitis, unspecified: Secondary | ICD-10-CM | POA: Diagnosis not present

## 2016-12-19 DIAGNOSIS — N186 End stage renal disease: Secondary | ICD-10-CM | POA: Diagnosis not present

## 2016-12-19 DIAGNOSIS — Z992 Dependence on renal dialysis: Secondary | ICD-10-CM | POA: Diagnosis not present

## 2016-12-19 DIAGNOSIS — N2581 Secondary hyperparathyroidism of renal origin: Secondary | ICD-10-CM | POA: Diagnosis not present

## 2016-12-19 DIAGNOSIS — D631 Anemia in chronic kidney disease: Secondary | ICD-10-CM | POA: Diagnosis not present

## 2016-12-19 DIAGNOSIS — E1122 Type 2 diabetes mellitus with diabetic chronic kidney disease: Secondary | ICD-10-CM | POA: Diagnosis not present

## 2016-12-21 DIAGNOSIS — L97526 Non-pressure chronic ulcer of other part of left foot with bone involvement without evidence of necrosis: Secondary | ICD-10-CM | POA: Diagnosis not present

## 2016-12-21 DIAGNOSIS — E11621 Type 2 diabetes mellitus with foot ulcer: Secondary | ICD-10-CM | POA: Diagnosis not present

## 2016-12-21 DIAGNOSIS — L89314 Pressure ulcer of right buttock, stage 4: Secondary | ICD-10-CM | POA: Diagnosis not present

## 2016-12-21 DIAGNOSIS — E1169 Type 2 diabetes mellitus with other specified complication: Secondary | ICD-10-CM | POA: Diagnosis not present

## 2016-12-21 DIAGNOSIS — G35 Multiple sclerosis: Secondary | ICD-10-CM | POA: Diagnosis not present

## 2016-12-21 DIAGNOSIS — M869 Osteomyelitis, unspecified: Secondary | ICD-10-CM | POA: Diagnosis not present

## 2016-12-22 DIAGNOSIS — N186 End stage renal disease: Secondary | ICD-10-CM | POA: Diagnosis not present

## 2016-12-22 DIAGNOSIS — N2581 Secondary hyperparathyroidism of renal origin: Secondary | ICD-10-CM | POA: Diagnosis not present

## 2016-12-22 DIAGNOSIS — D631 Anemia in chronic kidney disease: Secondary | ICD-10-CM | POA: Diagnosis not present

## 2016-12-22 DIAGNOSIS — M869 Osteomyelitis, unspecified: Secondary | ICD-10-CM | POA: Diagnosis not present

## 2016-12-22 DIAGNOSIS — E1122 Type 2 diabetes mellitus with diabetic chronic kidney disease: Secondary | ICD-10-CM | POA: Diagnosis not present

## 2016-12-22 DIAGNOSIS — Z992 Dependence on renal dialysis: Secondary | ICD-10-CM | POA: Diagnosis not present

## 2016-12-23 DIAGNOSIS — G35 Multiple sclerosis: Secondary | ICD-10-CM | POA: Diagnosis not present

## 2016-12-23 DIAGNOSIS — L89222 Pressure ulcer of left hip, stage 2: Secondary | ICD-10-CM | POA: Diagnosis not present

## 2016-12-23 DIAGNOSIS — E11621 Type 2 diabetes mellitus with foot ulcer: Secondary | ICD-10-CM | POA: Diagnosis not present

## 2016-12-23 DIAGNOSIS — I120 Hypertensive chronic kidney disease with stage 5 chronic kidney disease or end stage renal disease: Secondary | ICD-10-CM | POA: Diagnosis not present

## 2016-12-23 DIAGNOSIS — L97422 Non-pressure chronic ulcer of left heel and midfoot with fat layer exposed: Secondary | ICD-10-CM | POA: Diagnosis not present

## 2016-12-23 DIAGNOSIS — L89314 Pressure ulcer of right buttock, stage 4: Secondary | ICD-10-CM | POA: Diagnosis not present

## 2016-12-24 DIAGNOSIS — E1122 Type 2 diabetes mellitus with diabetic chronic kidney disease: Secondary | ICD-10-CM | POA: Diagnosis not present

## 2016-12-24 DIAGNOSIS — D631 Anemia in chronic kidney disease: Secondary | ICD-10-CM | POA: Diagnosis not present

## 2016-12-24 DIAGNOSIS — N186 End stage renal disease: Secondary | ICD-10-CM | POA: Diagnosis not present

## 2016-12-24 DIAGNOSIS — Z992 Dependence on renal dialysis: Secondary | ICD-10-CM | POA: Diagnosis not present

## 2016-12-24 DIAGNOSIS — M869 Osteomyelitis, unspecified: Secondary | ICD-10-CM | POA: Diagnosis not present

## 2016-12-24 DIAGNOSIS — N2581 Secondary hyperparathyroidism of renal origin: Secondary | ICD-10-CM | POA: Diagnosis not present

## 2016-12-25 DIAGNOSIS — L89222 Pressure ulcer of left hip, stage 2: Secondary | ICD-10-CM | POA: Diagnosis not present

## 2016-12-25 DIAGNOSIS — E11621 Type 2 diabetes mellitus with foot ulcer: Secondary | ICD-10-CM | POA: Diagnosis not present

## 2016-12-25 DIAGNOSIS — G35 Multiple sclerosis: Secondary | ICD-10-CM | POA: Diagnosis not present

## 2016-12-25 DIAGNOSIS — L89314 Pressure ulcer of right buttock, stage 4: Secondary | ICD-10-CM | POA: Diagnosis not present

## 2016-12-25 DIAGNOSIS — L97422 Non-pressure chronic ulcer of left heel and midfoot with fat layer exposed: Secondary | ICD-10-CM | POA: Diagnosis not present

## 2016-12-25 DIAGNOSIS — I120 Hypertensive chronic kidney disease with stage 5 chronic kidney disease or end stage renal disease: Secondary | ICD-10-CM | POA: Diagnosis not present

## 2016-12-26 DIAGNOSIS — D631 Anemia in chronic kidney disease: Secondary | ICD-10-CM | POA: Diagnosis not present

## 2016-12-26 DIAGNOSIS — Z992 Dependence on renal dialysis: Secondary | ICD-10-CM | POA: Diagnosis not present

## 2016-12-26 DIAGNOSIS — E1122 Type 2 diabetes mellitus with diabetic chronic kidney disease: Secondary | ICD-10-CM | POA: Diagnosis not present

## 2016-12-26 DIAGNOSIS — M869 Osteomyelitis, unspecified: Secondary | ICD-10-CM | POA: Diagnosis not present

## 2016-12-26 DIAGNOSIS — N186 End stage renal disease: Secondary | ICD-10-CM | POA: Diagnosis not present

## 2016-12-26 DIAGNOSIS — N2581 Secondary hyperparathyroidism of renal origin: Secondary | ICD-10-CM | POA: Diagnosis not present

## 2016-12-28 DIAGNOSIS — I120 Hypertensive chronic kidney disease with stage 5 chronic kidney disease or end stage renal disease: Secondary | ICD-10-CM | POA: Diagnosis not present

## 2016-12-28 DIAGNOSIS — E11621 Type 2 diabetes mellitus with foot ulcer: Secondary | ICD-10-CM | POA: Diagnosis not present

## 2016-12-28 DIAGNOSIS — L97422 Non-pressure chronic ulcer of left heel and midfoot with fat layer exposed: Secondary | ICD-10-CM | POA: Diagnosis not present

## 2016-12-28 DIAGNOSIS — L89222 Pressure ulcer of left hip, stage 2: Secondary | ICD-10-CM | POA: Diagnosis not present

## 2016-12-28 DIAGNOSIS — G35 Multiple sclerosis: Secondary | ICD-10-CM | POA: Diagnosis not present

## 2016-12-28 DIAGNOSIS — L89314 Pressure ulcer of right buttock, stage 4: Secondary | ICD-10-CM | POA: Diagnosis not present

## 2016-12-29 DIAGNOSIS — N186 End stage renal disease: Secondary | ICD-10-CM | POA: Diagnosis not present

## 2016-12-29 DIAGNOSIS — M869 Osteomyelitis, unspecified: Secondary | ICD-10-CM | POA: Diagnosis not present

## 2016-12-29 DIAGNOSIS — Z992 Dependence on renal dialysis: Secondary | ICD-10-CM | POA: Diagnosis not present

## 2016-12-29 DIAGNOSIS — E1122 Type 2 diabetes mellitus with diabetic chronic kidney disease: Secondary | ICD-10-CM | POA: Diagnosis not present

## 2016-12-29 DIAGNOSIS — N2581 Secondary hyperparathyroidism of renal origin: Secondary | ICD-10-CM | POA: Diagnosis not present

## 2016-12-29 DIAGNOSIS — D631 Anemia in chronic kidney disease: Secondary | ICD-10-CM | POA: Diagnosis not present

## 2016-12-30 DIAGNOSIS — I120 Hypertensive chronic kidney disease with stage 5 chronic kidney disease or end stage renal disease: Secondary | ICD-10-CM | POA: Diagnosis not present

## 2016-12-30 DIAGNOSIS — L89222 Pressure ulcer of left hip, stage 2: Secondary | ICD-10-CM | POA: Diagnosis not present

## 2016-12-30 DIAGNOSIS — L89314 Pressure ulcer of right buttock, stage 4: Secondary | ICD-10-CM | POA: Diagnosis not present

## 2016-12-30 DIAGNOSIS — E11621 Type 2 diabetes mellitus with foot ulcer: Secondary | ICD-10-CM | POA: Diagnosis not present

## 2016-12-30 DIAGNOSIS — G35 Multiple sclerosis: Secondary | ICD-10-CM | POA: Diagnosis not present

## 2016-12-30 DIAGNOSIS — L97422 Non-pressure chronic ulcer of left heel and midfoot with fat layer exposed: Secondary | ICD-10-CM | POA: Diagnosis not present

## 2016-12-31 ENCOUNTER — Ambulatory Visit (INDEPENDENT_AMBULATORY_CARE_PROVIDER_SITE_OTHER): Payer: Medicare Other | Admitting: Orthopedic Surgery

## 2016-12-31 DIAGNOSIS — M869 Osteomyelitis, unspecified: Secondary | ICD-10-CM | POA: Diagnosis not present

## 2016-12-31 DIAGNOSIS — E1122 Type 2 diabetes mellitus with diabetic chronic kidney disease: Secondary | ICD-10-CM | POA: Diagnosis not present

## 2016-12-31 DIAGNOSIS — D631 Anemia in chronic kidney disease: Secondary | ICD-10-CM | POA: Diagnosis not present

## 2016-12-31 DIAGNOSIS — N186 End stage renal disease: Secondary | ICD-10-CM | POA: Diagnosis not present

## 2016-12-31 DIAGNOSIS — N2581 Secondary hyperparathyroidism of renal origin: Secondary | ICD-10-CM | POA: Diagnosis not present

## 2016-12-31 DIAGNOSIS — Z992 Dependence on renal dialysis: Secondary | ICD-10-CM | POA: Diagnosis not present

## 2017-01-01 DIAGNOSIS — L89314 Pressure ulcer of right buttock, stage 4: Secondary | ICD-10-CM | POA: Diagnosis not present

## 2017-01-01 DIAGNOSIS — Z992 Dependence on renal dialysis: Secondary | ICD-10-CM | POA: Diagnosis not present

## 2017-01-01 DIAGNOSIS — G35 Multiple sclerosis: Secondary | ICD-10-CM | POA: Diagnosis not present

## 2017-01-01 DIAGNOSIS — S31819A Unspecified open wound of right buttock, initial encounter: Secondary | ICD-10-CM | POA: Diagnosis not present

## 2017-01-01 DIAGNOSIS — L97526 Non-pressure chronic ulcer of other part of left foot with bone involvement without evidence of necrosis: Secondary | ICD-10-CM | POA: Diagnosis not present

## 2017-01-01 DIAGNOSIS — E1169 Type 2 diabetes mellitus with other specified complication: Secondary | ICD-10-CM | POA: Diagnosis not present

## 2017-01-01 DIAGNOSIS — S91302A Unspecified open wound, left foot, initial encounter: Secondary | ICD-10-CM | POA: Diagnosis not present

## 2017-01-01 DIAGNOSIS — N186 End stage renal disease: Secondary | ICD-10-CM | POA: Diagnosis not present

## 2017-01-01 DIAGNOSIS — E1122 Type 2 diabetes mellitus with diabetic chronic kidney disease: Secondary | ICD-10-CM | POA: Diagnosis not present

## 2017-01-01 DIAGNOSIS — M869 Osteomyelitis, unspecified: Secondary | ICD-10-CM | POA: Diagnosis not present

## 2017-01-01 DIAGNOSIS — E11621 Type 2 diabetes mellitus with foot ulcer: Secondary | ICD-10-CM | POA: Diagnosis not present

## 2017-01-02 DIAGNOSIS — Z23 Encounter for immunization: Secondary | ICD-10-CM | POA: Diagnosis not present

## 2017-01-02 DIAGNOSIS — N2581 Secondary hyperparathyroidism of renal origin: Secondary | ICD-10-CM | POA: Diagnosis not present

## 2017-01-02 DIAGNOSIS — Z992 Dependence on renal dialysis: Secondary | ICD-10-CM | POA: Diagnosis not present

## 2017-01-02 DIAGNOSIS — E1122 Type 2 diabetes mellitus with diabetic chronic kidney disease: Secondary | ICD-10-CM | POA: Diagnosis not present

## 2017-01-02 DIAGNOSIS — D631 Anemia in chronic kidney disease: Secondary | ICD-10-CM | POA: Diagnosis not present

## 2017-01-02 DIAGNOSIS — N186 End stage renal disease: Secondary | ICD-10-CM | POA: Diagnosis not present

## 2017-01-04 DIAGNOSIS — G35 Multiple sclerosis: Secondary | ICD-10-CM | POA: Diagnosis not present

## 2017-01-04 DIAGNOSIS — L89314 Pressure ulcer of right buttock, stage 4: Secondary | ICD-10-CM | POA: Diagnosis not present

## 2017-01-04 DIAGNOSIS — E11621 Type 2 diabetes mellitus with foot ulcer: Secondary | ICD-10-CM | POA: Diagnosis not present

## 2017-01-04 DIAGNOSIS — L89222 Pressure ulcer of left hip, stage 2: Secondary | ICD-10-CM | POA: Diagnosis not present

## 2017-01-04 DIAGNOSIS — L97422 Non-pressure chronic ulcer of left heel and midfoot with fat layer exposed: Secondary | ICD-10-CM | POA: Diagnosis not present

## 2017-01-04 DIAGNOSIS — I120 Hypertensive chronic kidney disease with stage 5 chronic kidney disease or end stage renal disease: Secondary | ICD-10-CM | POA: Diagnosis not present

## 2017-01-05 DIAGNOSIS — Z992 Dependence on renal dialysis: Secondary | ICD-10-CM | POA: Diagnosis not present

## 2017-01-05 DIAGNOSIS — E1122 Type 2 diabetes mellitus with diabetic chronic kidney disease: Secondary | ICD-10-CM | POA: Diagnosis not present

## 2017-01-05 DIAGNOSIS — N2581 Secondary hyperparathyroidism of renal origin: Secondary | ICD-10-CM | POA: Diagnosis not present

## 2017-01-05 DIAGNOSIS — D631 Anemia in chronic kidney disease: Secondary | ICD-10-CM | POA: Diagnosis not present

## 2017-01-05 DIAGNOSIS — N186 End stage renal disease: Secondary | ICD-10-CM | POA: Diagnosis not present

## 2017-01-05 DIAGNOSIS — Z23 Encounter for immunization: Secondary | ICD-10-CM | POA: Diagnosis not present

## 2017-01-06 ENCOUNTER — Ambulatory Visit (INDEPENDENT_AMBULATORY_CARE_PROVIDER_SITE_OTHER): Payer: Medicare Other | Admitting: Orthopedic Surgery

## 2017-01-06 DIAGNOSIS — I120 Hypertensive chronic kidney disease with stage 5 chronic kidney disease or end stage renal disease: Secondary | ICD-10-CM | POA: Diagnosis not present

## 2017-01-06 DIAGNOSIS — E11621 Type 2 diabetes mellitus with foot ulcer: Secondary | ICD-10-CM | POA: Diagnosis not present

## 2017-01-06 DIAGNOSIS — M869 Osteomyelitis, unspecified: Secondary | ICD-10-CM | POA: Diagnosis not present

## 2017-01-06 DIAGNOSIS — G35 Multiple sclerosis: Secondary | ICD-10-CM | POA: Diagnosis not present

## 2017-01-06 DIAGNOSIS — L89314 Pressure ulcer of right buttock, stage 4: Secondary | ICD-10-CM | POA: Diagnosis not present

## 2017-01-06 DIAGNOSIS — L89222 Pressure ulcer of left hip, stage 2: Secondary | ICD-10-CM | POA: Diagnosis not present

## 2017-01-06 DIAGNOSIS — L97422 Non-pressure chronic ulcer of left heel and midfoot with fat layer exposed: Secondary | ICD-10-CM | POA: Diagnosis not present

## 2017-01-06 NOTE — Progress Notes (Signed)
Post-Op Visit Note   Patient: Lance [name]           Date of Birth: 09-Aug-1940           MRN: [id number] Visit Date: 01/06/2017 PCP: Lavone Orn, MD  Chief Complaint: No chief complaint on file.   HPI:  HPI The patient is a 76 year old gentleman with a long-standing history of an ulcer to the fifth metatarsal head on the left. Has been following with the wound center. Did have radiographs that were revealing for osteomyelitis. Has since completed a six-week course of vancomycin. The ulcer has been not showing improvement. Is here today for surgical consultation.  ROS: negative. No fever or chills.  Ortho Exam Patient is alert and oriented x 3. Normal affect. In power chair for mobility. On examination of the left foot the patient has an ulceration to the lateral aspect of the fifth metatarsal head. This is 15 mm in diameter and does probe to bone. There is bloody drainage no purulence no surrounding cellulitis. Him has a dopplerable dorsalis pedis pulse. Does have strong palpable posterior tibialis pulse.   Visit Diagnoses:  1. Osteomyelitis of other site, unspecified type [facility])     Plan: Have recommended 5th ray amputation.  Patient will consider this and call back with his decision. Discussed surgery with patient. Provided him Cheryl's card. Continue with daily wound care and offloading pressure. States would like to have surgery on a Friday if possible. Will need dialysis. Is a T Th Sat patient.   Follow-Up Instructions: Return for will call with decision about surgery.   Imaging: No results found.  Orders:  No orders of the defined types were placed in this encounter.  No orders of the defined types were placed in this encounter.    PMFS History: Patient Active Problem List   Diagnosis Date Noted  . Malnutrition of moderate degree 07/03/2016  . Osteomyelitis ([place]) 07/02/2016  . Symptomatic anemia 07/02/2016  . SIRS (systemic inflammatory response syndrome)  ([place]) 05/09/2016  . Melena 05/09/2016  . Hematemesis 05/09/2016  . Non-intractable vomiting with nausea 05/07/2016  . Altered mental status 04/21/2016  . Acute respiratory failure ([place])   . ESRD on dialysis ([place])   . NICM (nonischemic cardiomyopathy) ([place])   . Impaired functional mobility, balance, and endurance 02/17/2016  . Generalized anxiety disorder 02/17/2016  . Cardiomyopathy ([place]) 02/17/2016  . Hypoxia   . Acute pulmonary edema (HCC)   . Elevated troponin 02/14/2016  . Severe protein-calorie malnutrition ([place]) 02/14/2016  . Acute systolic heart failure ([place]) 02/14/2016  . Depression 02/13/2016  . DM (diabetes mellitus) type II controlled with renal manifestation ([place]) 09/06/2015  . Dyslipidemia associated with type 2 diabetes mellitus ([place]) 09/06/2015  . Recurrent UTI (urinary tract infection) 09/06/2015  . Decubitus ulcer of right ischial area 06/14/2015  . Decubitus ulcer of trochanter, stage 4 ([place]) 06/14/2015  . Left perineal ischial pressure ulcer   . Acute osteomyelitis, pelvis, right ([place])   . Essential hypertension, benign 08/03/2013  . Cellulitis of foot 08/02/2013  . Spastic quadriplegia ([place]) 07/10/2011  . Multiple sclerosis ([place]) 03/30/2011  . Sacral decubitus ulcer 03/30/2011  . Progressive anemia 03/30/2011   Past Medical History:  Diagnosis Date  . Acute CHF (congestive heart failure) ([place]) 02/14/2016  . Acute osteomyelitis, pelvis, right ([place])   . Acute renal failure ([place])   . Anemia in chronic renal disease   . Blood transfusion   . Cardiomyopathy ([place]) 02/17/2016  . Chronic spastic paraplegia  secondary to MS  . CKD (chronic kidney disease), stage III   . Decreased sensation of lower extremity    due to MS  . Decubitus ulcer of ischium   . Decubitus ulcer of trochanter, stage 4 ([place]) 06/14/2015  . History of MRSA infection    urine  . History of recurrent UTIs   . Hypertension   . MS (multiple sclerosis) ([place])   . Neuralgia neuritis,  sciatic nerve    MS for 30 years  . Neurogenic bladder   . PONV (postoperative nausea and vomiting)   . Self-catheterizes urinary bladder   . Type 2 diabetes mellitus (HCC)     Family History  Problem Relation Age of Onset  . Stroke Father   . Diabetes Father   . Diabetes Paternal Grandmother   . Stroke Paternal Grandfather   . Heart disease Neg Hx   . Cancer Neg Hx     Past Surgical History:  Procedure Laterality Date  . APPLICATION OF A-CELL OF EXTREMITY Left 09/05/2014   Procedure: PLACEMENT OF ACELL AND VAC;  Surgeon: Theodoro Kos, DO;  Location: [place];  Service: Plastics;  Laterality: Left;  . BASCILIC VEIN TRANSPOSITION Right 03/24/2016   Procedure: RADIAL- CEPHALIC FISTULA CREATION RIGHT ARM;  Surgeon: Conrad Snellville, MD;  Location: [place];  Service: Vascular;  Laterality: Right;  . EXTRACORPOREAL SHOCK WAVE LITHOTRIPSY  03-23-2011  . FINGER SURGERY  2004  . INCISION AND DRAINAGE OF WOUND Left 09/05/2014   Procedure: IRRIGATION AND DEBRIDEMENT OF LEFT ISCHIUM WOUND WITH ;  Surgeon: Theodoro Kos, DO;  Location: [place];  Service: Plastics;  Laterality: Left;  . INSERTION OF DIALYSIS CATHETER N/A 02/27/2016   Procedure: INSERTION OF DIALYSIS CATHETER-RIGHT INTERNAL JUGULA PLACEMENT;  Surgeon: Conrad Lowry, MD;  Location: [place];  Service: Vascular;  Laterality: N/A;  . TONSILLECTOMY    . WOUND DEBRIDEMENT     [facility]    Social History   Occupational History  . Not on file.   Social History Main Topics  . Smoking status: Former Smoker    Types: Cigars    Quit date: 05/06/1983  . Smokeless tobacco: Never Used  . Alcohol use No     Comment: pt has not had any since Dec 2017  . Drug use: No  . Sexual activity: Not Currently    Birth control/ protection: None

## 2017-01-07 DIAGNOSIS — N186 End stage renal disease: Secondary | ICD-10-CM | POA: Diagnosis not present

## 2017-01-07 DIAGNOSIS — N2581 Secondary hyperparathyroidism of renal origin: Secondary | ICD-10-CM | POA: Diagnosis not present

## 2017-01-07 DIAGNOSIS — E1122 Type 2 diabetes mellitus with diabetic chronic kidney disease: Secondary | ICD-10-CM | POA: Diagnosis not present

## 2017-01-07 DIAGNOSIS — Z23 Encounter for immunization: Secondary | ICD-10-CM | POA: Diagnosis not present

## 2017-01-07 DIAGNOSIS — Z992 Dependence on renal dialysis: Secondary | ICD-10-CM | POA: Diagnosis not present

## 2017-01-07 DIAGNOSIS — D631 Anemia in chronic kidney disease: Secondary | ICD-10-CM | POA: Diagnosis not present

## 2017-01-08 DIAGNOSIS — L89314 Pressure ulcer of right buttock, stage 4: Secondary | ICD-10-CM | POA: Diagnosis not present

## 2017-01-08 DIAGNOSIS — L97422 Non-pressure chronic ulcer of left heel and midfoot with fat layer exposed: Secondary | ICD-10-CM | POA: Diagnosis not present

## 2017-01-08 DIAGNOSIS — L89222 Pressure ulcer of left hip, stage 2: Secondary | ICD-10-CM | POA: Diagnosis not present

## 2017-01-08 DIAGNOSIS — E11621 Type 2 diabetes mellitus with foot ulcer: Secondary | ICD-10-CM | POA: Diagnosis not present

## 2017-01-08 DIAGNOSIS — G35 Multiple sclerosis: Secondary | ICD-10-CM | POA: Diagnosis not present

## 2017-01-08 DIAGNOSIS — I120 Hypertensive chronic kidney disease with stage 5 chronic kidney disease or end stage renal disease: Secondary | ICD-10-CM | POA: Diagnosis not present

## 2017-01-09 DIAGNOSIS — E1122 Type 2 diabetes mellitus with diabetic chronic kidney disease: Secondary | ICD-10-CM | POA: Diagnosis not present

## 2017-01-09 DIAGNOSIS — N2581 Secondary hyperparathyroidism of renal origin: Secondary | ICD-10-CM | POA: Diagnosis not present

## 2017-01-09 DIAGNOSIS — N186 End stage renal disease: Secondary | ICD-10-CM | POA: Diagnosis not present

## 2017-01-09 DIAGNOSIS — D631 Anemia in chronic kidney disease: Secondary | ICD-10-CM | POA: Diagnosis not present

## 2017-01-09 DIAGNOSIS — Z23 Encounter for immunization: Secondary | ICD-10-CM | POA: Diagnosis not present

## 2017-01-09 DIAGNOSIS — Z992 Dependence on renal dialysis: Secondary | ICD-10-CM | POA: Diagnosis not present

## 2017-01-11 DIAGNOSIS — L89314 Pressure ulcer of right buttock, stage 4: Secondary | ICD-10-CM | POA: Diagnosis not present

## 2017-01-11 DIAGNOSIS — E11621 Type 2 diabetes mellitus with foot ulcer: Secondary | ICD-10-CM | POA: Diagnosis not present

## 2017-01-11 DIAGNOSIS — I120 Hypertensive chronic kidney disease with stage 5 chronic kidney disease or end stage renal disease: Secondary | ICD-10-CM | POA: Diagnosis not present

## 2017-01-11 DIAGNOSIS — G35 Multiple sclerosis: Secondary | ICD-10-CM | POA: Diagnosis not present

## 2017-01-11 DIAGNOSIS — L97422 Non-pressure chronic ulcer of left heel and midfoot with fat layer exposed: Secondary | ICD-10-CM | POA: Diagnosis not present

## 2017-01-11 DIAGNOSIS — L89222 Pressure ulcer of left hip, stage 2: Secondary | ICD-10-CM | POA: Diagnosis not present

## 2017-01-12 DIAGNOSIS — D631 Anemia in chronic kidney disease: Secondary | ICD-10-CM | POA: Diagnosis not present

## 2017-01-12 DIAGNOSIS — E1122 Type 2 diabetes mellitus with diabetic chronic kidney disease: Secondary | ICD-10-CM | POA: Diagnosis not present

## 2017-01-12 DIAGNOSIS — Z23 Encounter for immunization: Secondary | ICD-10-CM | POA: Diagnosis not present

## 2017-01-12 DIAGNOSIS — Z992 Dependence on renal dialysis: Secondary | ICD-10-CM | POA: Diagnosis not present

## 2017-01-12 DIAGNOSIS — N186 End stage renal disease: Secondary | ICD-10-CM | POA: Diagnosis not present

## 2017-01-12 DIAGNOSIS — N2581 Secondary hyperparathyroidism of renal origin: Secondary | ICD-10-CM | POA: Diagnosis not present

## 2017-01-13 DIAGNOSIS — I120 Hypertensive chronic kidney disease with stage 5 chronic kidney disease or end stage renal disease: Secondary | ICD-10-CM | POA: Diagnosis not present

## 2017-01-13 DIAGNOSIS — G35 Multiple sclerosis: Secondary | ICD-10-CM | POA: Diagnosis not present

## 2017-01-13 DIAGNOSIS — E11621 Type 2 diabetes mellitus with foot ulcer: Secondary | ICD-10-CM | POA: Diagnosis not present

## 2017-01-13 DIAGNOSIS — L97422 Non-pressure chronic ulcer of left heel and midfoot with fat layer exposed: Secondary | ICD-10-CM | POA: Diagnosis not present

## 2017-01-13 DIAGNOSIS — L89314 Pressure ulcer of right buttock, stage 4: Secondary | ICD-10-CM | POA: Diagnosis not present

## 2017-01-13 DIAGNOSIS — L89222 Pressure ulcer of left hip, stage 2: Secondary | ICD-10-CM | POA: Diagnosis not present

## 2017-01-14 DIAGNOSIS — E1122 Type 2 diabetes mellitus with diabetic chronic kidney disease: Secondary | ICD-10-CM | POA: Diagnosis not present

## 2017-01-14 DIAGNOSIS — Z23 Encounter for immunization: Secondary | ICD-10-CM | POA: Diagnosis not present

## 2017-01-14 DIAGNOSIS — D631 Anemia in chronic kidney disease: Secondary | ICD-10-CM | POA: Diagnosis not present

## 2017-01-14 DIAGNOSIS — N2581 Secondary hyperparathyroidism of renal origin: Secondary | ICD-10-CM | POA: Diagnosis not present

## 2017-01-14 DIAGNOSIS — N186 End stage renal disease: Secondary | ICD-10-CM | POA: Diagnosis not present

## 2017-01-14 DIAGNOSIS — Z992 Dependence on renal dialysis: Secondary | ICD-10-CM | POA: Diagnosis not present

## 2017-01-15 DIAGNOSIS — I120 Hypertensive chronic kidney disease with stage 5 chronic kidney disease or end stage renal disease: Secondary | ICD-10-CM | POA: Diagnosis not present

## 2017-01-15 DIAGNOSIS — L89222 Pressure ulcer of left hip, stage 2: Secondary | ICD-10-CM | POA: Diagnosis not present

## 2017-01-15 DIAGNOSIS — E11621 Type 2 diabetes mellitus with foot ulcer: Secondary | ICD-10-CM | POA: Diagnosis not present

## 2017-01-15 DIAGNOSIS — L89314 Pressure ulcer of right buttock, stage 4: Secondary | ICD-10-CM | POA: Diagnosis not present

## 2017-01-15 DIAGNOSIS — L97422 Non-pressure chronic ulcer of left heel and midfoot with fat layer exposed: Secondary | ICD-10-CM | POA: Diagnosis not present

## 2017-01-15 DIAGNOSIS — G35 Multiple sclerosis: Secondary | ICD-10-CM | POA: Diagnosis not present

## 2017-01-16 DIAGNOSIS — E11621 Type 2 diabetes mellitus with foot ulcer: Secondary | ICD-10-CM | POA: Diagnosis not present

## 2017-01-16 DIAGNOSIS — G35 Multiple sclerosis: Secondary | ICD-10-CM | POA: Diagnosis not present

## 2017-01-16 DIAGNOSIS — N2581 Secondary hyperparathyroidism of renal origin: Secondary | ICD-10-CM | POA: Diagnosis not present

## 2017-01-16 DIAGNOSIS — I429 Cardiomyopathy, unspecified: Secondary | ICD-10-CM | POA: Diagnosis not present

## 2017-01-16 DIAGNOSIS — Z794 Long term (current) use of insulin: Secondary | ICD-10-CM | POA: Diagnosis not present

## 2017-01-16 DIAGNOSIS — N186 End stage renal disease: Secondary | ICD-10-CM | POA: Diagnosis not present

## 2017-01-16 DIAGNOSIS — Z8701 Personal history of pneumonia (recurrent): Secondary | ICD-10-CM | POA: Diagnosis not present

## 2017-01-16 DIAGNOSIS — Z87440 Personal history of urinary (tract) infections: Secondary | ICD-10-CM | POA: Diagnosis not present

## 2017-01-16 DIAGNOSIS — Z466 Encounter for fitting and adjustment of urinary device: Secondary | ICD-10-CM | POA: Diagnosis not present

## 2017-01-16 DIAGNOSIS — L89314 Pressure ulcer of right buttock, stage 4: Secondary | ICD-10-CM | POA: Diagnosis not present

## 2017-01-16 DIAGNOSIS — N319 Neuromuscular dysfunction of bladder, unspecified: Secondary | ICD-10-CM | POA: Diagnosis not present

## 2017-01-16 DIAGNOSIS — I120 Hypertensive chronic kidney disease with stage 5 chronic kidney disease or end stage renal disease: Secondary | ICD-10-CM | POA: Diagnosis not present

## 2017-01-16 DIAGNOSIS — Z23 Encounter for immunization: Secondary | ICD-10-CM | POA: Diagnosis not present

## 2017-01-16 DIAGNOSIS — R1312 Dysphagia, oropharyngeal phase: Secondary | ICD-10-CM | POA: Diagnosis not present

## 2017-01-16 DIAGNOSIS — E1122 Type 2 diabetes mellitus with diabetic chronic kidney disease: Secondary | ICD-10-CM | POA: Diagnosis not present

## 2017-01-16 DIAGNOSIS — E785 Hyperlipidemia, unspecified: Secondary | ICD-10-CM | POA: Diagnosis not present

## 2017-01-16 DIAGNOSIS — L97422 Non-pressure chronic ulcer of left heel and midfoot with fat layer exposed: Secondary | ICD-10-CM | POA: Diagnosis not present

## 2017-01-16 DIAGNOSIS — Z992 Dependence on renal dialysis: Secondary | ICD-10-CM | POA: Diagnosis not present

## 2017-01-16 DIAGNOSIS — D631 Anemia in chronic kidney disease: Secondary | ICD-10-CM | POA: Diagnosis not present

## 2017-01-18 DIAGNOSIS — E1122 Type 2 diabetes mellitus with diabetic chronic kidney disease: Secondary | ICD-10-CM | POA: Diagnosis not present

## 2017-01-18 DIAGNOSIS — E11621 Type 2 diabetes mellitus with foot ulcer: Secondary | ICD-10-CM | POA: Diagnosis not present

## 2017-01-18 DIAGNOSIS — L89314 Pressure ulcer of right buttock, stage 4: Secondary | ICD-10-CM | POA: Diagnosis not present

## 2017-01-18 DIAGNOSIS — I120 Hypertensive chronic kidney disease with stage 5 chronic kidney disease or end stage renal disease: Secondary | ICD-10-CM | POA: Diagnosis not present

## 2017-01-18 DIAGNOSIS — G35 Multiple sclerosis: Secondary | ICD-10-CM | POA: Diagnosis not present

## 2017-01-18 DIAGNOSIS — L97422 Non-pressure chronic ulcer of left heel and midfoot with fat layer exposed: Secondary | ICD-10-CM | POA: Diagnosis not present

## 2017-01-19 ENCOUNTER — Other Ambulatory Visit (INDEPENDENT_AMBULATORY_CARE_PROVIDER_SITE_OTHER): Payer: Self-pay | Admitting: Family

## 2017-01-19 DIAGNOSIS — E1122 Type 2 diabetes mellitus with diabetic chronic kidney disease: Secondary | ICD-10-CM | POA: Diagnosis not present

## 2017-01-19 DIAGNOSIS — Z992 Dependence on renal dialysis: Secondary | ICD-10-CM | POA: Diagnosis not present

## 2017-01-19 DIAGNOSIS — D631 Anemia in chronic kidney disease: Secondary | ICD-10-CM | POA: Diagnosis not present

## 2017-01-19 DIAGNOSIS — N2581 Secondary hyperparathyroidism of renal origin: Secondary | ICD-10-CM | POA: Diagnosis not present

## 2017-01-19 DIAGNOSIS — N186 End stage renal disease: Secondary | ICD-10-CM | POA: Diagnosis not present

## 2017-01-19 DIAGNOSIS — Z23 Encounter for immunization: Secondary | ICD-10-CM | POA: Diagnosis not present

## 2017-01-20 DIAGNOSIS — L89314 Pressure ulcer of right buttock, stage 4: Secondary | ICD-10-CM | POA: Diagnosis not present

## 2017-01-20 DIAGNOSIS — E11621 Type 2 diabetes mellitus with foot ulcer: Secondary | ICD-10-CM | POA: Diagnosis not present

## 2017-01-20 DIAGNOSIS — G35 Multiple sclerosis: Secondary | ICD-10-CM | POA: Diagnosis not present

## 2017-01-20 DIAGNOSIS — I120 Hypertensive chronic kidney disease with stage 5 chronic kidney disease or end stage renal disease: Secondary | ICD-10-CM | POA: Diagnosis not present

## 2017-01-20 DIAGNOSIS — E1122 Type 2 diabetes mellitus with diabetic chronic kidney disease: Secondary | ICD-10-CM | POA: Diagnosis not present

## 2017-01-20 DIAGNOSIS — L97422 Non-pressure chronic ulcer of left heel and midfoot with fat layer exposed: Secondary | ICD-10-CM | POA: Diagnosis not present

## 2017-01-21 ENCOUNTER — Encounter (HOSPITAL_COMMUNITY): Payer: Self-pay | Admitting: *Deleted

## 2017-01-21 DIAGNOSIS — D631 Anemia in chronic kidney disease: Secondary | ICD-10-CM | POA: Diagnosis not present

## 2017-01-21 DIAGNOSIS — Z23 Encounter for immunization: Secondary | ICD-10-CM | POA: Diagnosis not present

## 2017-01-21 DIAGNOSIS — L97422 Non-pressure chronic ulcer of left heel and midfoot with fat layer exposed: Secondary | ICD-10-CM | POA: Diagnosis not present

## 2017-01-21 DIAGNOSIS — G35 Multiple sclerosis: Secondary | ICD-10-CM | POA: Diagnosis not present

## 2017-01-21 DIAGNOSIS — I120 Hypertensive chronic kidney disease with stage 5 chronic kidney disease or end stage renal disease: Secondary | ICD-10-CM | POA: Diagnosis not present

## 2017-01-21 DIAGNOSIS — L89314 Pressure ulcer of right buttock, stage 4: Secondary | ICD-10-CM | POA: Diagnosis not present

## 2017-01-21 DIAGNOSIS — N2581 Secondary hyperparathyroidism of renal origin: Secondary | ICD-10-CM | POA: Diagnosis not present

## 2017-01-21 DIAGNOSIS — Z992 Dependence on renal dialysis: Secondary | ICD-10-CM | POA: Diagnosis not present

## 2017-01-21 DIAGNOSIS — E11621 Type 2 diabetes mellitus with foot ulcer: Secondary | ICD-10-CM | POA: Diagnosis not present

## 2017-01-21 DIAGNOSIS — N186 End stage renal disease: Secondary | ICD-10-CM | POA: Diagnosis not present

## 2017-01-21 DIAGNOSIS — E1122 Type 2 diabetes mellitus with diabetic chronic kidney disease: Secondary | ICD-10-CM | POA: Diagnosis not present

## 2017-01-21 NOTE — Progress Notes (Signed)
Lance [name] reports tha tCBGs run 140's - 190's.  I instructed patient to check CBG after awaking and every 2 hours until arrival  to the hospital.  I Instructed patient if CBG is less than 70 1/2 cup of a clear juice or regular sprite. Recheck CBG in 15 minutes then call pre- op desk at [phone] for further instructions. If scheduled to receive Insulin, do not take Insulin.  If CBG > 70 take 1/2 of Levemir dose- 4 units.  If CBG > 220 take 1/2 dose of Novolog.

## 2017-01-22 ENCOUNTER — Encounter (HOSPITAL_COMMUNITY): Payer: Self-pay | Admitting: Urology

## 2017-01-22 ENCOUNTER — Encounter (HOSPITAL_COMMUNITY)
Admission: RE | Disposition: A | Discharge status: Home / Self Care | Payer: Self-pay | Source: Ambulatory Visit | Attending: Orthopedic Surgery

## 2017-01-22 ENCOUNTER — Ambulatory Visit (HOSPITAL_COMMUNITY): Payer: Medicare Other | Admitting: Certified Registered Nurse Anesthetist

## 2017-01-22 ENCOUNTER — Ambulatory Visit (HOSPITAL_COMMUNITY)
Admission: RE | Admit: 2017-01-22 | Discharge: 2017-01-22 | Disposition: A | Discharge status: Home / Self Care | Payer: Medicare Other | Source: Ambulatory Visit | Attending: Orthopedic Surgery | Admitting: Orthopedic Surgery

## 2017-01-22 DIAGNOSIS — N186 End stage renal disease: Secondary | ICD-10-CM | POA: Insufficient documentation

## 2017-01-22 DIAGNOSIS — E11621 Type 2 diabetes mellitus with foot ulcer: Secondary | ICD-10-CM | POA: Diagnosis not present

## 2017-01-22 DIAGNOSIS — Z79899 Other long term (current) drug therapy: Secondary | ICD-10-CM | POA: Diagnosis not present

## 2017-01-22 DIAGNOSIS — Z8614 Personal history of Methicillin resistant Staphylococcus aureus infection: Secondary | ICD-10-CM | POA: Insufficient documentation

## 2017-01-22 DIAGNOSIS — Z87891 Personal history of nicotine dependence: Secondary | ICD-10-CM | POA: Insufficient documentation

## 2017-01-22 DIAGNOSIS — D631 Anemia in chronic kidney disease: Secondary | ICD-10-CM | POA: Insufficient documentation

## 2017-01-22 DIAGNOSIS — I132 Hypertensive heart and chronic kidney disease with heart failure and with stage 5 chronic kidney disease, or end stage renal disease: Secondary | ICD-10-CM | POA: Insufficient documentation

## 2017-01-22 DIAGNOSIS — Z794 Long term (current) use of insulin: Secondary | ICD-10-CM | POA: Diagnosis not present

## 2017-01-22 DIAGNOSIS — E1151 Type 2 diabetes mellitus with diabetic peripheral angiopathy without gangrene: Secondary | ICD-10-CM | POA: Insufficient documentation

## 2017-01-22 DIAGNOSIS — Z87440 Personal history of urinary (tract) infections: Secondary | ICD-10-CM | POA: Diagnosis not present

## 2017-01-22 DIAGNOSIS — I10 Essential (primary) hypertension: Secondary | ICD-10-CM | POA: Diagnosis not present

## 2017-01-22 DIAGNOSIS — M86272 Subacute osteomyelitis, left ankle and foot: Secondary | ICD-10-CM | POA: Diagnosis not present

## 2017-01-22 DIAGNOSIS — E1169 Type 2 diabetes mellitus with other specified complication: Secondary | ICD-10-CM | POA: Diagnosis not present

## 2017-01-22 DIAGNOSIS — Z87442 Personal history of urinary calculi: Secondary | ICD-10-CM | POA: Insufficient documentation

## 2017-01-22 DIAGNOSIS — E1122 Type 2 diabetes mellitus with diabetic chronic kidney disease: Secondary | ICD-10-CM | POA: Diagnosis not present

## 2017-01-22 DIAGNOSIS — G35 Multiple sclerosis: Secondary | ICD-10-CM | POA: Insufficient documentation

## 2017-01-22 DIAGNOSIS — I509 Heart failure, unspecified: Secondary | ICD-10-CM | POA: Insufficient documentation

## 2017-01-22 DIAGNOSIS — M868X7 Other osteomyelitis, ankle and foot: Secondary | ICD-10-CM | POA: Diagnosis not present

## 2017-01-22 DIAGNOSIS — M869 Osteomyelitis, unspecified: Secondary | ICD-10-CM | POA: Diagnosis not present

## 2017-01-22 DIAGNOSIS — I5021 Acute systolic (congestive) heart failure: Secondary | ICD-10-CM | POA: Diagnosis not present

## 2017-01-22 DIAGNOSIS — L97529 Non-pressure chronic ulcer of other part of left foot with unspecified severity: Secondary | ICD-10-CM | POA: Diagnosis not present

## 2017-01-22 HISTORY — DX: Personal history of urinary calculi: Z87.442

## 2017-01-22 HISTORY — PX: AMPUTATION: SHX166

## 2017-01-22 HISTORY — DX: Presence of urogenital implants: Z96.0

## 2017-01-22 HISTORY — DX: End stage renal disease: N18.6

## 2017-01-22 LAB — POCT I-STAT 4, (NA,K, GLUC, HGB,HCT)
Glucose, Bld: 152 mg/dL — ABNORMAL HIGH (ref 65–99)
HEMATOCRIT: 39 % (ref 39.0–52.0)
HEMOGLOBIN: 13.3 g/dL (ref 13.0–17.0)
Potassium: 3.3 mmol/L — ABNORMAL LOW (ref 3.5–5.1)
SODIUM: 138 mmol/L (ref 135–145)

## 2017-01-22 LAB — GLUCOSE, CAPILLARY: Glucose-Capillary: 149 mg/dL — ABNORMAL HIGH (ref 65–99)

## 2017-01-22 SURGERY — AMPUTATION, FOOT, RAY
Anesthesia: Monitor Anesthesia Care | Laterality: Left

## 2017-01-22 MED ORDER — EPHEDRINE 5 MG/ML INJ
INTRAVENOUS | Status: AC
  Filled 2017-01-22: qty 20

## 2017-01-22 MED ORDER — ONDANSETRON HCL 4 MG/2ML IJ SOLN
INTRAMUSCULAR | Status: DC | PRN
  Administered 2017-01-22: 4 mg via INTRAVENOUS

## 2017-01-22 MED ORDER — PROPOFOL 10 MG/ML IV BOLUS
INTRAVENOUS | Status: AC
  Filled 2017-01-22: qty 20

## 2017-01-22 MED ORDER — PROMETHAZINE HCL 25 MG/ML IJ SOLN: 6.2500 mg | INTRAMUSCULAR | Status: DC | PRN

## 2017-01-22 MED ORDER — PHENYLEPHRINE 40 MCG/ML (10ML) SYRINGE FOR IV PUSH (FOR BLOOD PRESSURE SUPPORT)
PREFILLED_SYRINGE | INTRAVENOUS | Status: AC
  Filled 2017-01-22: qty 10

## 2017-01-22 MED ORDER — CEFAZOLIN SODIUM-DEXTROSE 2-4 GM/100ML-% IV SOLN
2.0000 g | INTRAVENOUS | Status: AC
  Administered 2017-01-22: 2 g via INTRAVENOUS
  Filled 2017-01-22: qty 100

## 2017-01-22 MED ORDER — FENTANYL CITRATE (PF) 100 MCG/2ML IJ SOLN
INTRAMUSCULAR | Status: DC | PRN
  Administered 2017-01-22 (×2): 50 ug via INTRAVENOUS

## 2017-01-22 MED ORDER — HYDROMORPHONE HCL 1 MG/ML IJ SOLN: 0.2500 mg | INTRAMUSCULAR | Status: DC | PRN

## 2017-01-22 MED ORDER — OXYCODONE HCL 5 MG PO TABS: 5.0000 mg | ORAL_TABLET | Freq: Once | ORAL | Status: DC | PRN

## 2017-01-22 MED ORDER — FENTANYL CITRATE (PF) 250 MCG/5ML IJ SOLN
INTRAMUSCULAR | Status: AC
  Filled 2017-01-22: qty 5

## 2017-01-22 MED ORDER — SODIUM CHLORIDE 0.9 % IV SOLN
INTRAVENOUS | Status: DC | PRN
  Administered 2017-01-22: 07:00:00 via INTRAVENOUS

## 2017-01-22 MED ORDER — PROPOFOL 10 MG/ML IV BOLUS
INTRAVENOUS | Status: DC | PRN
  Administered 2017-01-22: 150 mg via INTRAVENOUS

## 2017-01-22 MED ORDER — CHLORHEXIDINE GLUCONATE 4 % EX LIQD: 60.0000 mL | Freq: Once | CUTANEOUS | Status: DC

## 2017-01-22 MED ORDER — LIDOCAINE HCL (CARDIAC) 20 MG/ML IV SOLN
INTRAVENOUS | Status: DC | PRN
  Administered 2017-01-22: 50 mg via INTRAVENOUS

## 2017-01-22 MED ORDER — DEXAMETHASONE SODIUM PHOSPHATE 10 MG/ML IJ SOLN
INTRAMUSCULAR | Status: DC | PRN
  Administered 2017-01-22: 4 mg via INTRAVENOUS

## 2017-01-22 MED ORDER — DEXAMETHASONE SODIUM PHOSPHATE 10 MG/ML IJ SOLN
INTRAMUSCULAR | Status: AC
  Filled 2017-01-22: qty 1

## 2017-01-22 MED ORDER — ONDANSETRON HCL 4 MG/2ML IJ SOLN
INTRAMUSCULAR | Status: AC
  Filled 2017-01-22: qty 2

## 2017-01-22 MED ORDER — 0.9 % SODIUM CHLORIDE (POUR BTL) OPTIME
TOPICAL | Status: DC | PRN
  Administered 2017-01-22: 1000 mL

## 2017-01-22 MED ORDER — OXYCODONE HCL 5 MG/5ML PO SOLN: 5.0000 mg | Freq: Once | ORAL | Status: DC | PRN

## 2017-01-22 SURGICAL SUPPLY — 33 items
BLADE SAW SGTL MED 73X18.5 STR (BLADE) ×3 IMPLANT
BLADE SURG 21 STRL SS (BLADE) ×3 IMPLANT
BNDG COHESIVE 4X5 TAN STRL (GAUZE/BANDAGES/DRESSINGS) ×3 IMPLANT
BNDG GAUZE ELAST 4 BULKY (GAUZE/BANDAGES/DRESSINGS) ×3 IMPLANT
COVER SURGICAL LIGHT HANDLE (MISCELLANEOUS) ×3 IMPLANT
DRAPE INCISE IOBAN 66X45 STRL (DRAPES) ×3 IMPLANT
DRAPE U-SHAPE 47X51 STRL (DRAPES) ×3 IMPLANT
DRSG ADAPTIC 3X8 NADH LF (GAUZE/BANDAGES/DRESSINGS) ×3 IMPLANT
DRSG PAD ABDOMINAL 8X10 ST (GAUZE/BANDAGES/DRESSINGS) ×3 IMPLANT
DURAPREP 26ML APPLICATOR (WOUND CARE) ×3 IMPLANT
ELECT REM PT RETURN 9FT ADLT (ELECTROSURGICAL) ×3
ELECTRODE REM PT RTRN 9FT ADLT (ELECTROSURGICAL) ×1 IMPLANT
GAUZE SPONGE 4X4 12PLY STRL (GAUZE/BANDAGES/DRESSINGS) ×3 IMPLANT
GAUZE SPONGE 4X4 12PLY STRL LF (GAUZE/BANDAGES/DRESSINGS) ×3 IMPLANT
GLOVE BIOGEL PI IND STRL 9 (GLOVE) ×2 IMPLANT
GLOVE BIOGEL PI INDICATOR 9 (GLOVE) ×4
GLOVE SURG ORTHO 9.0 STRL STRW (GLOVE) ×6 IMPLANT
GOWN STRL REUS W/ TWL XL LVL3 (GOWN DISPOSABLE) ×2 IMPLANT
GOWN STRL REUS W/TWL XL LVL3 (GOWN DISPOSABLE) ×4
KIT BASIN OR (CUSTOM PROCEDURE TRAY) ×3 IMPLANT
KIT DRSG PREVENA PLUS 7DAY 125 (MISCELLANEOUS) ×3 IMPLANT
KIT PREVENA INCISION MGT 13 (CANNISTER) ×3 IMPLANT
KIT ROOM TURNOVER OR (KITS) ×3 IMPLANT
NS IRRIG 1000ML POUR BTL (IV SOLUTION) ×3 IMPLANT
PACK ORTHO EXTREMITY (CUSTOM PROCEDURE TRAY) ×3 IMPLANT
PAD ABD 8X10 STRL (GAUZE/BANDAGES/DRESSINGS) ×3 IMPLANT
PAD ARMBOARD 7.5X6 YLW CONV (MISCELLANEOUS) ×3 IMPLANT
STOCKINETTE IMPERVIOUS LG (DRAPES) IMPLANT
SUT ETHILON 2 0 PSLX (SUTURE) ×3 IMPLANT
TOWEL OR 17X26 10 PK STRL BLUE (TOWEL DISPOSABLE) ×3 IMPLANT
TUBE CONNECTING 12'X1/4 (SUCTIONS)
TUBE CONNECTING 12X1/4 (SUCTIONS) IMPLANT
YANKAUER SUCT BULB TIP NO VENT (SUCTIONS) IMPLANT

## 2017-01-22 NOTE — Anesthesia Postprocedure Evaluation (Signed)
Anesthesia Post Note  Patient: Lance [name]  Procedure(s) Performed: Procedure(s) (LRB): Left 5th Ray Amputation (Left)     Patient location during evaluation: PACU Anesthesia Type: General Level of consciousness: awake and alert Pain management: pain level controlled Vital Signs Assessment: post-procedure vital signs reviewed and stable Respiratory status: spontaneous breathing, nonlabored ventilation and respiratory function stable Cardiovascular status: blood pressure returned to baseline and stable Postop Assessment: no apparent nausea or vomiting Anesthetic complications: no    Last Vitals:  Vitals:   01/22/17 0900 01/22/17 0906  BP: (!) 102/41 (!) 102/41  Pulse: 66 68  Resp: 14 15  Temp: 36.5 C   SpO2: 94% 96%    Last Pain:  Vitals:   01/22/17 0906  TempSrc:   PainSc: 0-No pain                 Lynda Rainwater

## 2017-01-22 NOTE — Progress Notes (Signed)
Pt with decubitus ulcer to right buttocks, per patient this is a stage 4 wound that requires a wound vac at home. Patients states wound vac was removed for surgery today by home health RN and wound with wet to dry dressing and foam dressing over gauze. Brown/clear/yellow drainage noted to dressing this AM. Dressing changed as patient states he will not see his home health RN today.

## 2017-01-22 NOTE — Anesthesia Procedure Notes (Signed)
Procedure Name: LMA Insertion Date/Time: 01/22/2017 7:48 AM Performed by: Rejeana Brock L Pre-anesthesia Checklist: Patient identified, Emergency Drugs available, Suction available and Patient being monitored Patient Re-evaluated:Patient Re-evaluated prior to induction Oxygen Delivery Method: Circle System Utilized Preoxygenation: Pre-oxygenation with 100% oxygen Induction Type: IV induction Ventilation: Mask ventilation without difficulty LMA: LMA inserted LMA Size: 5.0 Number of attempts: 1 Airway Equipment and Method: Bite block Placement Confirmation: positive ETCO2 and breath sounds checked- equal and bilateral Tube secured with: Tape Dental Injury: Teeth and Oropharynx as per pre-operative assessment

## 2017-01-22 NOTE — Anesthesia Preprocedure Evaluation (Addendum)
Anesthesia Evaluation  Patient identified by MRN, date of birth, ID band Patient awake    Reviewed: Allergy & Precautions, NPO status , Patient's Chart, lab work & pertinent test results, reviewed documented beta blocker date and time   History of Anesthesia Complications (+) PONV and history of anesthetic complications  Airway Mallampati: II   Neck ROM: Full    Dental  (+) Teeth Intact, Dental Advisory Given   Pulmonary neg pulmonary ROS, former smoker,    breath sounds clear to auscultation       Cardiovascular hypertension, Pt. on medications and Pt. on home beta blockers +CHF  negative cardio ROS   Rhythm:Regular Rate:Normal     Neuro/Psych Depression  Neuromuscular disease negative neurological ROS  negative psych ROS   GI/Hepatic negative GI ROS, Neg liver ROS,   Endo/Other  negative endocrine ROSdiabetes, Type 2, Insulin Dependent  Renal/GU Renal Insufficiency and ESRFRenal diseasenegative Renal ROS  negative genitourinary   Musculoskeletal negative musculoskeletal ROS (+)   Abdominal   Peds negative pediatric ROS (+)  Hematology negative hematology ROS (+)   Anesthesia Other Findings   Reproductive/Obstetrics negative OB ROS                             Lab Results  Component Value Date   WBC 8.1 08/14/2016   HGB 13.3 01/22/2017   HCT 39.0 01/22/2017   MCV 91.4 08/14/2016   PLT 227 08/14/2016   Lab Results  Component Value Date   CREATININE 2.45 (H) 08/14/2016   BUN 43 (H) 08/14/2016   NA 138 01/22/2017   K 3.3 (L) 01/22/2017   CL 101 08/14/2016   CO2 30 08/14/2016    Anesthesia Physical  Anesthesia Plan  ASA: IV  Anesthesia Plan: General   Post-op Pain Management:    Induction: Intravenous  PONV Risk Score and Plan: 2 and Ondansetron, Midazolam and Treatment may vary due to age or medical condition  Airway Management Planned: LMA  Additional  Equipment:   Intra-op Plan:   Post-operative Plan: Extubation in OR  Informed Consent: I have reviewed the patients History and Physical, chart, labs and discussed the procedure including the risks, benefits and alternatives for the proposed anesthesia with the patient or authorized representative who has indicated his/her understanding and acceptance.   Dental advisory given  Plan Discussed with: CRNA  Anesthesia Plan Comments:        Anesthesia Quick Evaluation

## 2017-01-22 NOTE — Op Note (Signed)
01/22/2017  8:10 AM  PATIENT:  Lance [name]    PRE-OPERATIVE DIAGNOSIS:  Osteomyelitis Left 5th Metatarsal   POST-OPERATIVE DIAGNOSIS:  Same  PROCEDURE:  Left 5th Ray Amputation Local tissue rearrangement for wound closure 3 x 7 cm. Application wound VAC  SURGEON:  Newt Minion, MD  PHYSICIAN ASSISTANT:None ANESTHESIA:   General  PREOPERATIVE INDICATIONS:  Lance [name] is a  76 y.o. male with a diagnosis of Osteomyelitis Left 5th Metatarsal  who failed conservative measures and elected for surgical management.    The risks benefits and alternatives were discussed with the patient preoperatively including but not limited to the risks of infection, bleeding, nerve injury, cardiopulmonary complications, the need for revision surgery, among others, and the patient was willing to proceed.  OPERATIVE IMPLANTS: Prevena wound VAC  OPERATIVE FINDINGS: Osteomyelitis fifth metatarsal head  OPERATIVE PROCEDURE: Patient was brought to the operating room and underwent a general anesthetic. After adequate levels anesthesia obtained patient's left lower extremity was prepped using DuraPrep draped into a sterile field a timeout was called. A racquet incision was made around the ulcer and the fifth toe. The ray was resected through the base with an oscillating saw. Local tissue rearrangement was used for wound closure for wound 3 x 7 cm. A Prevena wound VAC was applied this had a good suction fit patient was extubated taken to the PACU in stable condition.

## 2017-01-22 NOTE — H&P (Signed)
Lance [name] is an 76 y.o. male.   Chief Complaint: Ulceration osteomyelitis left foot fifth metatarsal head. HPI: Patient is a 76 year old gentleman with peripheral vascular disease end stage renal disease diabetes with a chronic nonhealing ulcer with osteomyelitis of the fifth metatarsal head. Patient is undergone prolonged conservative care with wound care at the wound center.  Past Medical History:  Diagnosis Date  . Acute CHF (congestive heart failure) ([place]) 02/14/2016  . Acute osteomyelitis, pelvis, right ([place])   . Acute renal failure ([place])   . Anemia in chronic renal disease   . Blood transfusion   . Cardiomyopathy ([place]) 02/17/2016  . Chronic spastic paraplegia    secondary to MS  . Decreased sensation of lower extremity    due to MS  . Decubitus ulcer of ischium   . Decubitus ulcer of trochanter, stage 4 ([place]) 06/14/2015  . ESRD (end stage renal disease) ([place])    [employer]  . History of kidney stones    x2  . History of MRSA infection    urine  . History of recurrent UTIs   . Hypertension   . MS (multiple sclerosis) ([place])   . Neuralgia neuritis, sciatic nerve    MS for 30 years  . Neurogenic bladder   . PONV (postoperative nausea and vomiting)   . Presence of indwelling urinary catheter   . Self-catheterizes urinary bladder   . Type 2 diabetes mellitus ([place])     Past Surgical History:  Procedure Laterality Date  . APPLICATION OF A-CELL OF EXTREMITY Left 09/05/2014   Procedure: PLACEMENT OF ACELL AND VAC;  Surgeon: Theodoro Kos, DO;  Location: [facility];  Service: Plastics;  Laterality: Left;  . BASCILIC VEIN TRANSPOSITION Right 03/24/2016   Procedure: RADIAL- CEPHALIC FISTULA CREATION RIGHT ARM;  Surgeon: Conrad Castro, MD;  Location: [place];  Service: Vascular;  Laterality: Right;  . Buttocks flap Left   . COLONOSCOPY    . EXTRACORPOREAL SHOCK WAVE LITHOTRIPSY  03/23/2011   x2  . FINGER SURGERY  2004  . hip flap Right   . INCISION AND DRAINAGE  OF WOUND Left 09/05/2014   Procedure: IRRIGATION AND DEBRIDEMENT OF LEFT ISCHIUM WOUND WITH ;  Surgeon: Theodoro Kos, DO;  Location: [place];  Service: Plastics;  Laterality: Left;  . INSERTION OF DIALYSIS CATHETER N/A 02/27/2016   Procedure: INSERTION OF DIALYSIS CATHETER-RIGHT INTERNAL JUGULA PLACEMENT;  Surgeon: Conrad Roma, MD;  Location: [place];  Service: Vascular;  Laterality: N/A;  . TONSILLECTOMY    . WOUND DEBRIDEMENT     [facility]2/17[facility]     Family History  Problem Relation Age of Onset  . Stroke Father   . Diabetes Father   . Diabetes Paternal Grandmother   . Stroke Paternal Grandfather   . Heart disease Neg Hx   . Cancer Neg Hx    Social History:  reports that he quit smoking about 33 years ago. His smoking use included Cigars. He has never used smokeless tobacco. He reports that he does not drink alcohol or use drugs.  Allergies: No Known Allergies  Medications Prior to Admission  Medication Sig Dispense Refill  . baclofen (LIORESAL) 10 MG tablet Take 1 tablet (10 mg total) by mouth at bedtime as needed for muscle spasms. 30 each 0  . carvedilol (COREG) 3.125 MG tablet Take 1 tablet (3.125 mg total) by mouth 2 (two) times daily with a meal. 60 tablet 0  . insulin aspart (NOVOLOG FLEXPEN) 100 UNIT/ML FlexPen  Inject 0-9 Units into the skin 3 (three) times daily with meals. Per sliding scale (Patient taking differently: Inject 6 Units into the skin 3 (three) times daily with meals. Per sliding scale ) 15 mL 0  . insulin detemir (LEVEMIR) 100 UNIT/ML injection Inject 9 Units into the skin daily with breakfast.    . lidocaine-prilocaine (EMLA) cream Apply 1 application topically as needed. 30 g 1  . simvastatin (ZOCOR) 20 MG tablet Take 20 mg by mouth every evening.    . bisacodyl (DULCOLAX) 10 MG suppository Place 10 mg rectally as needed for moderate constipation.    . Insulin Pen Needle (PEN NEEDLES) 30G X 8 MM MISC Inject 1 each into the skin 4 (four) times daily. . 100 each 0  .  linaclotide (LINZESS) 145 MCG CAPS capsule Take 1 capsule (145 mcg total) by mouth every other day. (Patient not taking: Reported on 01/20/2017) 30 capsule 0  . Melatonin 3 MG TABS Take 3 mg by mouth at bedtime.    . Skin Protectants, Misc. (EUCERIN) cream Apply 1 application topically daily. For feet      Results for orders placed or performed during the hospital encounter of 01/22/17 (from the past 48 hour(s))  I-STAT 4, (NA,K, GLUC, HGB,HCT)     Status: Abnormal   Collection Time: 01/22/17  6:21 AM  Result Value Ref Range   Sodium 138 135 - 145 mmol/L   Potassium 3.3 (L) 3.5 - 5.1 mmol/L   Glucose, Bld 152 (H) 65 - 99 mg/dL   HCT 39.0 39.0 - 52.0 %   Hemoglobin 13.3 13.0 - 17.0 g/dL   No results found.  Review of Systems  All other systems reviewed and are negative.   There were no vitals taken for this visit. Physical Exam  Examination patient is alert oriented no adenopathy well-dressed normal affect normal S1 after patient does have an antalgic gait imitation is ulceration with exposed bone fifth metatarsal head left foot radiographs shows destructive bony changes. Patient has a palpable pulse. Assessment/Plan Assessment: Peripheral vascular disease with type 2 diabetes and end-stage renal disease with osteomyelitis ulceration left fifth metatarsal head.  Plan: We'll plan for left foot fifth ray amputation and application of wound VAC. Risks and benefits were discussed including risk of the wound not healing need for higher level amputation. Patient states he understands wishes to proceed at this time.  Newt Minion, MD 01/22/2017, 6:33 AM

## 2017-01-22 NOTE — Transfer of Care (Signed)
Immediate Anesthesia Transfer of Care Note  Patient: Lance [name]  Procedure(s) Performed: Procedure(s): Left 5th Ray Amputation (Left)  Patient Location: PACU  Anesthesia Type:General  Level of Consciousness: awake, alert  and oriented  Airway & Oxygen Therapy: Patient Spontanous Breathing and Patient connected to nasal cannula oxygen  Post-op Assessment: Report given to RN and Post -op Vital signs reviewed and stable  Post vital signs: Reviewed and stable  Last Vitals:  Vitals:   01/22/17 0643 01/22/17 0818  BP: (!) 118/50 (!) (P) 113/49  Pulse: 81 (P) 73  Resp: 18 (P) 14  Temp: 36.9 C (!) (P) 36.1 C  SpO2: 97% (P) 97%    Last Pain:  Vitals:   01/22/17 0643  TempSrc: Oral      Patients Stated Pain Goal: 3 (10/40/45 9136)  Complications: No apparent anesthesia complications

## 2017-01-23 ENCOUNTER — Encounter (HOSPITAL_COMMUNITY): Payer: Self-pay | Admitting: Orthopedic Surgery

## 2017-01-23 DIAGNOSIS — N2581 Secondary hyperparathyroidism of renal origin: Secondary | ICD-10-CM | POA: Diagnosis not present

## 2017-01-23 DIAGNOSIS — D631 Anemia in chronic kidney disease: Secondary | ICD-10-CM | POA: Diagnosis not present

## 2017-01-23 DIAGNOSIS — Z23 Encounter for immunization: Secondary | ICD-10-CM | POA: Diagnosis not present

## 2017-01-23 DIAGNOSIS — N186 End stage renal disease: Secondary | ICD-10-CM | POA: Diagnosis not present

## 2017-01-23 DIAGNOSIS — E1122 Type 2 diabetes mellitus with diabetic chronic kidney disease: Secondary | ICD-10-CM | POA: Diagnosis not present

## 2017-01-23 DIAGNOSIS — Z992 Dependence on renal dialysis: Secondary | ICD-10-CM | POA: Diagnosis not present

## 2017-01-25 DIAGNOSIS — E11621 Type 2 diabetes mellitus with foot ulcer: Secondary | ICD-10-CM | POA: Diagnosis not present

## 2017-01-25 DIAGNOSIS — I120 Hypertensive chronic kidney disease with stage 5 chronic kidney disease or end stage renal disease: Secondary | ICD-10-CM | POA: Diagnosis not present

## 2017-01-25 DIAGNOSIS — G35 Multiple sclerosis: Secondary | ICD-10-CM | POA: Diagnosis not present

## 2017-01-25 DIAGNOSIS — E1122 Type 2 diabetes mellitus with diabetic chronic kidney disease: Secondary | ICD-10-CM | POA: Diagnosis not present

## 2017-01-25 DIAGNOSIS — L97422 Non-pressure chronic ulcer of left heel and midfoot with fat layer exposed: Secondary | ICD-10-CM | POA: Diagnosis not present

## 2017-01-25 DIAGNOSIS — L89314 Pressure ulcer of right buttock, stage 4: Secondary | ICD-10-CM | POA: Diagnosis not present

## 2017-01-26 DIAGNOSIS — Z23 Encounter for immunization: Secondary | ICD-10-CM | POA: Diagnosis not present

## 2017-01-26 DIAGNOSIS — E1122 Type 2 diabetes mellitus with diabetic chronic kidney disease: Secondary | ICD-10-CM | POA: Diagnosis not present

## 2017-01-26 DIAGNOSIS — N2581 Secondary hyperparathyroidism of renal origin: Secondary | ICD-10-CM | POA: Diagnosis not present

## 2017-01-26 DIAGNOSIS — D631 Anemia in chronic kidney disease: Secondary | ICD-10-CM | POA: Diagnosis not present

## 2017-01-26 DIAGNOSIS — Z992 Dependence on renal dialysis: Secondary | ICD-10-CM | POA: Diagnosis not present

## 2017-01-26 DIAGNOSIS — N186 End stage renal disease: Secondary | ICD-10-CM | POA: Diagnosis not present

## 2017-01-27 ENCOUNTER — Ambulatory Visit (INDEPENDENT_AMBULATORY_CARE_PROVIDER_SITE_OTHER): Payer: Medicare Other | Admitting: Family

## 2017-01-27 ENCOUNTER — Telehealth (INDEPENDENT_AMBULATORY_CARE_PROVIDER_SITE_OTHER): Payer: Self-pay | Admitting: Radiology

## 2017-01-27 ENCOUNTER — Encounter (INDEPENDENT_AMBULATORY_CARE_PROVIDER_SITE_OTHER): Payer: Self-pay | Admitting: Family

## 2017-01-27 DIAGNOSIS — Z89432 Acquired absence of left foot: Secondary | ICD-10-CM

## 2017-01-27 NOTE — Progress Notes (Signed)
Post-Op Visit Note   Patient: Lance [name]           Date of Birth: [birth year]           MRN: [id number] Visit Date: 01/27/2017 PCP: Lavone Orn, MD  Chief Complaint:  Chief Complaint  Patient presents with  . Left Foot - Routine Post Op, Follow-up    HPI:  HPI The patient is a 76 year old gentleman who is seen today status post fifth ray amputation on Friday his wound VAC alarming. Ortho Exam Wound VAC removed incision approximate sutures there is moderate bleeding. No erythema odor or minimal swelling  Visit Diagnoses:  1. Partial nontraumatic amputation of foot, left (HCC)     Plan: Dry dressing applied we'll have [facility] do dry dressing changes and wound cleansing at their visits. He'll keep his follow-up with Sharol Given for Monday  Follow-Up Instructions: Return in about 1 week (around 02/03/2017).   Imaging: No results found.  Orders:  No orders of the defined types were placed in this encounter.  No orders of the defined types were placed in this encounter.    PMFS History: Patient Active Problem List   Diagnosis Date Noted  . Partial nontraumatic amputation of foot, left ([place]) 01/27/2017  . Malnutrition of moderate degree 07/03/2016  . Symptomatic anemia 07/02/2016  . SIRS (systemic inflammatory response syndrome) ([place]) 05/09/2016  . Melena 05/09/2016  . Hematemesis 05/09/2016  . Non-intractable vomiting with nausea 05/07/2016  . Altered mental status 04/21/2016  . Acute respiratory failure ([place])   . ESRD on dialysis ([place])   . NICM (nonischemic cardiomyopathy) ([place])   . Impaired functional mobility, balance, and endurance 02/17/2016  . Generalized anxiety disorder 02/17/2016  . Cardiomyopathy ([place]) 02/17/2016  . Hypoxia   . Acute pulmonary edema (HCC)   . Elevated troponin 02/14/2016  . Severe protein-calorie malnutrition ([place]) 02/14/2016  . Acute systolic heart failure ([place]) 02/14/2016  . Depression 02/13/2016  . DM (diabetes  mellitus) type II controlled with renal manifestation ([place]) 09/06/2015  . Dyslipidemia associated with type 2 diabetes mellitus ([place]) 09/06/2015  . Recurrent UTI (urinary tract infection) 09/06/2015  . Decubitus ulcer of right ischial area 06/14/2015  . Decubitus ulcer of trochanter, stage 4 ([place]) 06/14/2015  . Left perineal ischial pressure ulcer   . Acute osteomyelitis, pelvis, right ([place])   . Essential hypertension, benign 08/03/2013  . Cellulitis of foot 08/02/2013  . Spastic quadriplegia ([place]) 07/10/2011  . Multiple sclerosis ([place]) 03/30/2011  . Sacral decubitus ulcer 03/30/2011  . Progressive anemia 03/30/2011   Past Medical History:  Diagnosis Date  . Acute CHF (congestive heart failure) ([place]) 02/14/2016  . Acute osteomyelitis, pelvis, right ([place])   . Acute renal failure ([place])   . Anemia in chronic renal disease   . Blood transfusion   . Cardiomyopathy ([place]) 02/17/2016  . Chronic spastic paraplegia    secondary to MS  . Decreased sensation of lower extremity    due to MS  . Decubitus ulcer of ischium   . Decubitus ulcer of trochanter, stage 4 ([place]) 06/14/2015  . ESRD (end stage renal disease) ([place])    [place]  . History of kidney stones    x2  . History of MRSA infection    urine  . History of recurrent UTIs   . Hypertension   . MS (multiple sclerosis) ([place])   . Neuralgia neuritis, sciatic nerve    MS for 30 years  . Neurogenic bladder   .  PONV (postoperative nausea and vomiting)   . Presence of indwelling urinary catheter   . Self-catheterizes urinary bladder   . Type 2 diabetes mellitus (HCC)     Family History  Problem Relation Age of Onset  . Stroke Father   . Diabetes Father   . Diabetes Paternal Grandmother   . Stroke Paternal Grandfather   . Heart disease Neg Hx   . Cancer Neg Hx     Past Surgical History:  Procedure Laterality Date  . AMPUTATION Left 01/22/2017   Procedure: Left 5th Ray Amputation;  Surgeon: Newt Minion, MD;   Location: [facility];  Service: Orthopedics;  Laterality: Left;  . APPLICATION OF A-CELL OF EXTREMITY Left 09/05/2014   Procedure: PLACEMENT OF ACELL AND VAC;  Surgeon: Theodoro Kos, DO;  Location: [facility];  Service: Plastics;  Laterality: Left;  . BASCILIC VEIN TRANSPOSITION Right 03/24/2016   Procedure: RADIAL- CEPHALIC FISTULA CREATION RIGHT ARM;  Surgeon: Conrad Gilbert, MD;  Location: [facility];  Service: Vascular;  Laterality: Right;  . Buttocks flap Left   . COLONOSCOPY    . EXTRACORPOREAL SHOCK WAVE LITHOTRIPSY  03/23/2011   x2  . FINGER SURGERY  2004  . hip flap Right   . INCISION AND DRAINAGE OF WOUND Left 09/05/2014   Procedure: IRRIGATION AND DEBRIDEMENT OF LEFT ISCHIUM WOUND WITH ;  Surgeon: Theodoro Kos, DO;  Location: [facility];  Service: Plastics;  Laterality: Left;  . INSERTION OF DIALYSIS CATHETER N/A 02/27/2016   Procedure: INSERTION OF DIALYSIS CATHETER-RIGHT INTERNAL JUGULA PLACEMENT;  Surgeon: Conrad El Sobrante, MD;  Location: [facility];  Service: Vascular;  Laterality: N/A;  . TONSILLECTOMY    . WOUND DEBRIDEMENT     [facility]17[facility]    Social History   Occupational History  . Not on file.   Social History Main Topics  . Smoking status: Former Smoker    Types: Cigars    Quit date: 05/06/1983  . Smokeless tobacco: Never Used  . Alcohol use No     Comment: pt has not had any since Dec 2017  . Drug use: No  . Sexual activity: Not Currently    Birth control/ protection: None

## 2017-01-27 NOTE — Telephone Encounter (Signed)
Order faxed underneath communications to [facility]

## 2017-01-27 NOTE — Telephone Encounter (Signed)
-----   Message from Suzan Slick, NP sent at 01/27/2017 12:31 PM EDT ----- [facility] already seeing patient for other wounds etc, can you ask them to do dry dressing changes to the left surgical foot

## 2017-01-28 DIAGNOSIS — N186 End stage renal disease: Secondary | ICD-10-CM | POA: Diagnosis not present

## 2017-01-28 DIAGNOSIS — Z992 Dependence on renal dialysis: Secondary | ICD-10-CM | POA: Diagnosis not present

## 2017-01-28 DIAGNOSIS — D631 Anemia in chronic kidney disease: Secondary | ICD-10-CM | POA: Diagnosis not present

## 2017-01-28 DIAGNOSIS — N2581 Secondary hyperparathyroidism of renal origin: Secondary | ICD-10-CM | POA: Diagnosis not present

## 2017-01-28 DIAGNOSIS — Z23 Encounter for immunization: Secondary | ICD-10-CM | POA: Diagnosis not present

## 2017-01-28 DIAGNOSIS — E1122 Type 2 diabetes mellitus with diabetic chronic kidney disease: Secondary | ICD-10-CM | POA: Diagnosis not present

## 2017-01-29 DIAGNOSIS — G35 Multiple sclerosis: Secondary | ICD-10-CM | POA: Diagnosis not present

## 2017-01-29 DIAGNOSIS — L97422 Non-pressure chronic ulcer of left heel and midfoot with fat layer exposed: Secondary | ICD-10-CM | POA: Diagnosis not present

## 2017-01-29 DIAGNOSIS — E11621 Type 2 diabetes mellitus with foot ulcer: Secondary | ICD-10-CM | POA: Diagnosis not present

## 2017-01-29 DIAGNOSIS — E1122 Type 2 diabetes mellitus with diabetic chronic kidney disease: Secondary | ICD-10-CM | POA: Diagnosis not present

## 2017-01-29 DIAGNOSIS — I120 Hypertensive chronic kidney disease with stage 5 chronic kidney disease or end stage renal disease: Secondary | ICD-10-CM | POA: Diagnosis not present

## 2017-01-29 DIAGNOSIS — L89314 Pressure ulcer of right buttock, stage 4: Secondary | ICD-10-CM | POA: Diagnosis not present

## 2017-01-30 DIAGNOSIS — Z23 Encounter for immunization: Secondary | ICD-10-CM | POA: Diagnosis not present

## 2017-01-30 DIAGNOSIS — E1122 Type 2 diabetes mellitus with diabetic chronic kidney disease: Secondary | ICD-10-CM | POA: Diagnosis not present

## 2017-01-30 DIAGNOSIS — N2581 Secondary hyperparathyroidism of renal origin: Secondary | ICD-10-CM | POA: Diagnosis not present

## 2017-01-30 DIAGNOSIS — N186 End stage renal disease: Secondary | ICD-10-CM | POA: Diagnosis not present

## 2017-01-30 DIAGNOSIS — D631 Anemia in chronic kidney disease: Secondary | ICD-10-CM | POA: Diagnosis not present

## 2017-01-30 DIAGNOSIS — Z992 Dependence on renal dialysis: Secondary | ICD-10-CM | POA: Diagnosis not present

## 2017-01-31 DIAGNOSIS — N186 End stage renal disease: Secondary | ICD-10-CM | POA: Diagnosis not present

## 2017-01-31 DIAGNOSIS — E1122 Type 2 diabetes mellitus with diabetic chronic kidney disease: Secondary | ICD-10-CM | POA: Diagnosis not present

## 2017-01-31 DIAGNOSIS — Z992 Dependence on renal dialysis: Secondary | ICD-10-CM | POA: Diagnosis not present

## 2017-02-01 ENCOUNTER — Ambulatory Visit (INDEPENDENT_AMBULATORY_CARE_PROVIDER_SITE_OTHER): Payer: Medicare Other | Admitting: Orthopedic Surgery

## 2017-02-01 DIAGNOSIS — I120 Hypertensive chronic kidney disease with stage 5 chronic kidney disease or end stage renal disease: Secondary | ICD-10-CM | POA: Diagnosis not present

## 2017-02-01 DIAGNOSIS — Z89432 Acquired absence of left foot: Secondary | ICD-10-CM

## 2017-02-01 DIAGNOSIS — G35 Multiple sclerosis: Secondary | ICD-10-CM | POA: Diagnosis not present

## 2017-02-01 DIAGNOSIS — L97422 Non-pressure chronic ulcer of left heel and midfoot with fat layer exposed: Secondary | ICD-10-CM | POA: Diagnosis not present

## 2017-02-01 DIAGNOSIS — E1122 Type 2 diabetes mellitus with diabetic chronic kidney disease: Secondary | ICD-10-CM | POA: Diagnosis not present

## 2017-02-01 DIAGNOSIS — E11621 Type 2 diabetes mellitus with foot ulcer: Secondary | ICD-10-CM | POA: Diagnosis not present

## 2017-02-01 DIAGNOSIS — L89314 Pressure ulcer of right buttock, stage 4: Secondary | ICD-10-CM | POA: Diagnosis not present

## 2017-02-01 NOTE — Progress Notes (Signed)
Office Visit Note   Patient: Lance [name]           Date of Birth: March 07, 1941           MRN: [id number] Visit Date: 02/01/2017              Requested by: Lavone Orn, MD [address] [place], [place] [address] PCP: Lavone Orn, MD  Chief Complaint  Patient presents with  . Left Foot - Routine Post Op      HPI: Patient is a 76 year old gentleman who ambulates her motorized wheelchair who is 1 week status post left foot fifth ray amputation. Patient states that he only had to take Tylenol for a few days for pain.  Assessment & Plan: Visit Diagnoses:  1. Partial nontraumatic amputation of foot, left (HCC)     Plan: The incision is healing nicely continue dry dressing changes continue with the protective boot follow-up in 1 week to remove the sutures.  Follow-Up Instructions: Return in about 1 week (around 02/08/2017).   Ortho Exam  Patient is alert, oriented, no adenopathy, well-dressed, normal affect, normal respiratory effort. Examination the incision is well approximated there is no redness no cellulitis no dehiscence no signs of infection.  Imaging: No results found. No images are attached to the encounter.  Labs: Lab Results  Component Value Date   HGBA1C 6.1 04/10/2016   HGBA1C 6.3 (H) 03/03/2016   HGBA1C 6.0 (H) 02/14/2016   ESRSEDRATE 126 (H) 03/24/2016   ESRSEDRATE 130 (H) 03/18/2016   ESRSEDRATE 125 (H) 02/15/2016   CRP 4.1 (H) 03/24/2016   CRP 5.6 (H) 03/18/2016   CRP 3.4 (H) 02/14/2016   REPTSTATUS 10/27/2016 FINAL 10/23/2016   GRAMSTAIN  10/23/2016    NO WBC SEEN RARE GRAM POSITIVE COCCI IN PAIRS Performed at [facility], [place] [address], [place], [place] [address]    CULT  10/23/2016    FEW METHICILLIN RESISTANT STAPHYLOCOCCUS AUREUS FEW ENTEROCOCCUS FAECALIS    LABORGA METHICILLIN RESISTANT STAPHYLOCOCCUS AUREUS 10/23/2016   LABORGA ENTEROCOCCUS FAECALIS 10/23/2016    Orders:  No orders of the defined types  were placed in this encounter.  No orders of the defined types were placed in this encounter.    Procedures: No procedures performed  Clinical Data: No additional findings.  ROS:  All other systems negative, except as noted in the HPI. Review of Systems  Objective: Vital Signs: There were no vitals taken for this visit.  Specialty Comments:  No specialty comments available.  PMFS History: Patient Active Problem List   Diagnosis Date Noted  . Partial nontraumatic amputation of foot, left ([place]) 01/27/2017  . Malnutrition of moderate degree 07/03/2016  . Symptomatic anemia 07/02/2016  . SIRS (systemic inflammatory response syndrome) ([place]) 05/09/2016  . Melena 05/09/2016  . Hematemesis 05/09/2016  . Non-intractable vomiting with nausea 05/07/2016  . Altered mental status 04/21/2016  . Acute respiratory failure ([place])   . ESRD on dialysis ([place])   . NICM (nonischemic cardiomyopathy) ([place])   . Impaired functional mobility, balance, and endurance 02/17/2016  . Generalized anxiety disorder 02/17/2016  . Cardiomyopathy ([place]) 02/17/2016  . Hypoxia   . Acute pulmonary edema (HCC)   . Elevated troponin 02/14/2016  . Severe protein-calorie malnutrition ([place]) 02/14/2016  . Acute systolic heart failure ([place]) 02/14/2016  . Depression 02/13/2016  . DM (diabetes mellitus) type II controlled with renal manifestation ([place]) 09/06/2015  . Dyslipidemia associated with type 2 diabetes mellitus ([place]) 09/06/2015  . Recurrent  UTI (urinary tract infection) 09/06/2015  . Decubitus ulcer of right ischial area 06/14/2015  . Decubitus ulcer of trochanter, stage 4 ([place]) 06/14/2015  . Left perineal ischial pressure ulcer   . Acute osteomyelitis, pelvis, right ([place])   . Essential hypertension, benign 08/03/2013  . Cellulitis of foot 08/02/2013  . Spastic quadriplegia ([place]) 07/10/2011  . Multiple sclerosis ([place]) 03/30/2011  . Sacral decubitus ulcer 03/30/2011  . Progressive anemia 03/30/2011    Past Medical History:  Diagnosis Date  . Acute CHF (congestive heart failure) ([place]) 02/14/2016  . Acute osteomyelitis, pelvis, right ([place])   . Acute renal failure ([place])   . Anemia in chronic renal disease   . Blood transfusion   . Cardiomyopathy ([place]) 02/17/2016  . Chronic spastic paraplegia    secondary to MS  . Decreased sensation of lower extremity    due to MS  . Decubitus ulcer of ischium   . Decubitus ulcer of trochanter, stage 4 ([place]) 06/14/2015  . ESRD (end stage renal disease) ([place])    [place]  . History of kidney stones    x2  . History of MRSA infection    urine  . History of recurrent UTIs   . Hypertension   . MS (multiple sclerosis) ([place])   . Neuralgia neuritis, sciatic nerve    MS for 30 years  . Neurogenic bladder   . PONV (postoperative nausea and vomiting)   . Presence of indwelling urinary catheter   . Self-catheterizes urinary bladder   . Type 2 diabetes mellitus (HCC)     Family History  Problem Relation Age of Onset  . Stroke Father   . Diabetes Father   . Diabetes Paternal Grandmother   . Stroke Paternal Grandfather   . Heart disease Neg Hx   . Cancer Neg Hx     Past Surgical History:  Procedure Laterality Date  . AMPUTATION Left 01/22/2017   Procedure: Left 5th Ray Amputation;  Surgeon: Newt Minion, MD;  Location: [facility];  Service: Orthopedics;  Laterality: Left;  . APPLICATION OF A-CELL OF EXTREMITY Left 09/05/2014   Procedure: PLACEMENT OF ACELL AND VAC;  Surgeon: Theodoro Kos, DO;  Location: [facility];  Service: Plastics;  Laterality: Left;  . BASCILIC VEIN TRANSPOSITION Right 03/24/2016   Procedure: RADIAL- CEPHALIC FISTULA CREATION RIGHT ARM;  Surgeon: Conrad Otis, MD;  Location: [facility];  Service: Vascular;  Laterality: Right;  . Buttocks flap Left   . COLONOSCOPY    . EXTRACORPOREAL SHOCK WAVE LITHOTRIPSY  03/23/2011   x2  . FINGER SURGERY  2004  . hip flap Right   . INCISION AND DRAINAGE OF WOUND Left 09/05/2014    Procedure: IRRIGATION AND DEBRIDEMENT OF LEFT ISCHIUM WOUND WITH ;  Surgeon: Theodoro Kos, DO;  Location: [facility];  Service: Plastics;  Laterality: Left;  . INSERTION OF DIALYSIS CATHETER N/A 02/27/2016   Procedure: INSERTION OF DIALYSIS CATHETER-RIGHT INTERNAL JUGULA PLACEMENT;  Surgeon: Conrad [place], MD;  Location: [facility];  Service: Vascular;  Laterality: N/A;  . TONSILLECTOMY    . WOUND DEBRIDEMENT     [facility]2/17[facility]    Social History   Occupational History  . Not on file.   Social History Main Topics  . Smoking status: Former Smoker    Types: Cigars    Quit date: 05/06/1983  . Smokeless tobacco: Never Used  . Alcohol use No     Comment: pt has not had any since Dec 2017  . Drug use:  No  . Sexual activity: Not Currently    Birth control/ protection: None

## 2017-02-02 DIAGNOSIS — E1122 Type 2 diabetes mellitus with diabetic chronic kidney disease: Secondary | ICD-10-CM | POA: Diagnosis not present

## 2017-02-02 DIAGNOSIS — N2581 Secondary hyperparathyroidism of renal origin: Secondary | ICD-10-CM | POA: Diagnosis not present

## 2017-02-02 DIAGNOSIS — D631 Anemia in chronic kidney disease: Secondary | ICD-10-CM | POA: Diagnosis not present

## 2017-02-02 DIAGNOSIS — N186 End stage renal disease: Secondary | ICD-10-CM | POA: Diagnosis not present

## 2017-02-02 DIAGNOSIS — Z992 Dependence on renal dialysis: Secondary | ICD-10-CM | POA: Diagnosis not present

## 2017-02-03 DIAGNOSIS — L97422 Non-pressure chronic ulcer of left heel and midfoot with fat layer exposed: Secondary | ICD-10-CM | POA: Diagnosis not present

## 2017-02-03 DIAGNOSIS — G35 Multiple sclerosis: Secondary | ICD-10-CM | POA: Diagnosis not present

## 2017-02-03 DIAGNOSIS — I120 Hypertensive chronic kidney disease with stage 5 chronic kidney disease or end stage renal disease: Secondary | ICD-10-CM | POA: Diagnosis not present

## 2017-02-03 DIAGNOSIS — E1122 Type 2 diabetes mellitus with diabetic chronic kidney disease: Secondary | ICD-10-CM | POA: Diagnosis not present

## 2017-02-03 DIAGNOSIS — E11621 Type 2 diabetes mellitus with foot ulcer: Secondary | ICD-10-CM | POA: Diagnosis not present

## 2017-02-03 DIAGNOSIS — L89314 Pressure ulcer of right buttock, stage 4: Secondary | ICD-10-CM | POA: Diagnosis not present

## 2017-02-04 DIAGNOSIS — N2581 Secondary hyperparathyroidism of renal origin: Secondary | ICD-10-CM | POA: Diagnosis not present

## 2017-02-04 DIAGNOSIS — Z992 Dependence on renal dialysis: Secondary | ICD-10-CM | POA: Diagnosis not present

## 2017-02-04 DIAGNOSIS — D631 Anemia in chronic kidney disease: Secondary | ICD-10-CM | POA: Diagnosis not present

## 2017-02-04 DIAGNOSIS — E1122 Type 2 diabetes mellitus with diabetic chronic kidney disease: Secondary | ICD-10-CM | POA: Diagnosis not present

## 2017-02-04 DIAGNOSIS — N186 End stage renal disease: Secondary | ICD-10-CM | POA: Diagnosis not present

## 2017-02-05 DIAGNOSIS — L89314 Pressure ulcer of right buttock, stage 4: Secondary | ICD-10-CM | POA: Diagnosis not present

## 2017-02-05 DIAGNOSIS — L97422 Non-pressure chronic ulcer of left heel and midfoot with fat layer exposed: Secondary | ICD-10-CM | POA: Diagnosis not present

## 2017-02-05 DIAGNOSIS — G35 Multiple sclerosis: Secondary | ICD-10-CM | POA: Diagnosis not present

## 2017-02-05 DIAGNOSIS — E11621 Type 2 diabetes mellitus with foot ulcer: Secondary | ICD-10-CM | POA: Diagnosis not present

## 2017-02-05 DIAGNOSIS — I120 Hypertensive chronic kidney disease with stage 5 chronic kidney disease or end stage renal disease: Secondary | ICD-10-CM | POA: Diagnosis not present

## 2017-02-05 DIAGNOSIS — E1122 Type 2 diabetes mellitus with diabetic chronic kidney disease: Secondary | ICD-10-CM | POA: Diagnosis not present

## 2017-02-06 DIAGNOSIS — E1122 Type 2 diabetes mellitus with diabetic chronic kidney disease: Secondary | ICD-10-CM | POA: Diagnosis not present

## 2017-02-06 DIAGNOSIS — N2581 Secondary hyperparathyroidism of renal origin: Secondary | ICD-10-CM | POA: Diagnosis not present

## 2017-02-06 DIAGNOSIS — Z992 Dependence on renal dialysis: Secondary | ICD-10-CM | POA: Diagnosis not present

## 2017-02-06 DIAGNOSIS — N186 End stage renal disease: Secondary | ICD-10-CM | POA: Diagnosis not present

## 2017-02-06 DIAGNOSIS — D631 Anemia in chronic kidney disease: Secondary | ICD-10-CM | POA: Diagnosis not present

## 2017-02-08 DIAGNOSIS — E11621 Type 2 diabetes mellitus with foot ulcer: Secondary | ICD-10-CM | POA: Diagnosis not present

## 2017-02-08 DIAGNOSIS — G35 Multiple sclerosis: Secondary | ICD-10-CM | POA: Diagnosis not present

## 2017-02-08 DIAGNOSIS — L89314 Pressure ulcer of right buttock, stage 4: Secondary | ICD-10-CM | POA: Diagnosis not present

## 2017-02-08 DIAGNOSIS — L97422 Non-pressure chronic ulcer of left heel and midfoot with fat layer exposed: Secondary | ICD-10-CM | POA: Diagnosis not present

## 2017-02-08 DIAGNOSIS — I120 Hypertensive chronic kidney disease with stage 5 chronic kidney disease or end stage renal disease: Secondary | ICD-10-CM | POA: Diagnosis not present

## 2017-02-08 DIAGNOSIS — E1122 Type 2 diabetes mellitus with diabetic chronic kidney disease: Secondary | ICD-10-CM | POA: Diagnosis not present

## 2017-02-09 DIAGNOSIS — E1122 Type 2 diabetes mellitus with diabetic chronic kidney disease: Secondary | ICD-10-CM | POA: Diagnosis not present

## 2017-02-09 DIAGNOSIS — N186 End stage renal disease: Secondary | ICD-10-CM | POA: Diagnosis not present

## 2017-02-09 DIAGNOSIS — N2581 Secondary hyperparathyroidism of renal origin: Secondary | ICD-10-CM | POA: Diagnosis not present

## 2017-02-09 DIAGNOSIS — D631 Anemia in chronic kidney disease: Secondary | ICD-10-CM | POA: Diagnosis not present

## 2017-02-09 DIAGNOSIS — Z992 Dependence on renal dialysis: Secondary | ICD-10-CM | POA: Diagnosis not present

## 2017-02-10 ENCOUNTER — Encounter (INDEPENDENT_AMBULATORY_CARE_PROVIDER_SITE_OTHER): Payer: Self-pay | Admitting: Family

## 2017-02-10 ENCOUNTER — Ambulatory Visit (INDEPENDENT_AMBULATORY_CARE_PROVIDER_SITE_OTHER): Payer: Medicare Other | Admitting: Family

## 2017-02-10 DIAGNOSIS — E11621 Type 2 diabetes mellitus with foot ulcer: Secondary | ICD-10-CM | POA: Diagnosis not present

## 2017-02-10 DIAGNOSIS — Z89432 Acquired absence of left foot: Secondary | ICD-10-CM

## 2017-02-10 DIAGNOSIS — L97422 Non-pressure chronic ulcer of left heel and midfoot with fat layer exposed: Secondary | ICD-10-CM | POA: Diagnosis not present

## 2017-02-10 DIAGNOSIS — G35 Multiple sclerosis: Secondary | ICD-10-CM | POA: Diagnosis not present

## 2017-02-10 DIAGNOSIS — L89314 Pressure ulcer of right buttock, stage 4: Secondary | ICD-10-CM | POA: Diagnosis not present

## 2017-02-10 DIAGNOSIS — E1122 Type 2 diabetes mellitus with diabetic chronic kidney disease: Secondary | ICD-10-CM | POA: Diagnosis not present

## 2017-02-10 DIAGNOSIS — I120 Hypertensive chronic kidney disease with stage 5 chronic kidney disease or end stage renal disease: Secondary | ICD-10-CM | POA: Diagnosis not present

## 2017-02-10 NOTE — Progress Notes (Signed)
Post-Op Visit Note   Patient: Lance [name]           Date of Birth: [birth year]           MRN: [id number] Visit Date: 02/10/2017 PCP: Lavone Orn, MD  Chief Complaint:  Chief Complaint  Patient presents with  . Lower Back - Routine Post Op    HPI:  HPI The patient is a 76 year old gentleman seen about 3 weeks status post fifth ray amputation on the left. He is in a motorized wheelchair does use a pressure relieving moon boot. Ortho Exam Incision left foot healing well sutures in place to harvest these today no erythema or drainage minimal swelling no sign of infection. Visit Diagnoses:  1. Partial nontraumatic amputation of foot, left (HCC)     Plan: Sutures harvested without incident. Continue daily dry dressing changes with home health will follow up once more in 2 weeks. Intense pain relief that time.  Follow-Up Instructions: Return in about 2 weeks (around 02/24/2017).   Imaging: No results found.  Orders:  No orders of the defined types were placed in this encounter.  No orders of the defined types were placed in this encounter.    PMFS History: Patient Active Problem List   Diagnosis Date Noted  . Partial nontraumatic amputation of foot, left ([place]) 01/27/2017  . Malnutrition of moderate degree 07/03/2016  . Symptomatic anemia 07/02/2016  . SIRS (systemic inflammatory response syndrome) ([place]) 05/09/2016  . Melena 05/09/2016  . Hematemesis 05/09/2016  . Non-intractable vomiting with nausea 05/07/2016  . Altered mental status 04/21/2016  . Acute respiratory failure ([place])   . ESRD on dialysis ([place])   . NICM (nonischemic cardiomyopathy) ([place])   . Impaired functional mobility, balance, and endurance 02/17/2016  . Generalized anxiety disorder 02/17/2016  . Cardiomyopathy ([place]) 02/17/2016  . Hypoxia   . Acute pulmonary edema (HCC)   . Elevated troponin 02/14/2016  . Severe protein-calorie malnutrition ([place]) 02/14/2016  . Acute systolic heart failure  ([place]) 02/14/2016  . Depression 02/13/2016  . DM (diabetes mellitus) type II controlled with renal manifestation ([place]) 09/06/2015  . Dyslipidemia associated with type 2 diabetes mellitus ([place]) 09/06/2015  . Recurrent UTI (urinary tract infection) 09/06/2015  . Decubitus ulcer of right ischial area 06/14/2015  . Decubitus ulcer of trochanter, stage 4 ([place]) 06/14/2015  . Left perineal ischial pressure ulcer   . Acute osteomyelitis, pelvis, right ([place])   . Essential hypertension, benign 08/03/2013  . Spastic quadriplegia ([place]) 07/10/2011  . Multiple sclerosis ([place]) 03/30/2011  . Sacral decubitus ulcer 03/30/2011  . Progressive anemia 03/30/2011   Past Medical History:  Diagnosis Date  . Acute CHF (congestive heart failure) ([place]) 02/14/2016  . Acute osteomyelitis, pelvis, right ([place])   . Acute renal failure ([place])   . Anemia in chronic renal disease   . Blood transfusion   . Cardiomyopathy ([place]) 02/17/2016  . Chronic spastic paraplegia    secondary to MS  . Decreased sensation of lower extremity    due to MS  . Decubitus ulcer of ischium   . Decubitus ulcer of trochanter, stage 4 ([place]) 06/14/2015  . ESRD (end stage renal disease) ([place])    [place]  . History of kidney stones    x2  . History of MRSA infection    urine  . History of recurrent UTIs   . Hypertension   . MS (multiple sclerosis) ([place])   . Neuralgia neuritis, sciatic nerve    MS for 30  years  . Neurogenic bladder   . PONV (postoperative nausea and vomiting)   . Presence of indwelling urinary catheter   . Self-catheterizes urinary bladder   . Type 2 diabetes mellitus (HCC)     Family History  Problem Relation Age of Onset  . Stroke Father   . Diabetes Father   . Diabetes Paternal Grandmother   . Stroke Paternal Grandfather   . Heart disease Neg Hx   . Cancer Neg Hx     Past Surgical History:  Procedure Laterality Date  . AMPUTATION Left 01/22/2017   Procedure: Left 5th Ray Amputation;   Surgeon: Newt Minion, MD;  Location: [facility];  Service: Orthopedics;  Laterality: Left;  . APPLICATION OF A-CELL OF EXTREMITY Left 09/05/2014   Procedure: PLACEMENT OF ACELL AND VAC;  Surgeon: Theodoro Kos, DO;  Location: [facility];  Service: Plastics;  Laterality: Left;  . BASCILIC VEIN TRANSPOSITION Right 03/24/2016   Procedure: RADIAL- CEPHALIC FISTULA CREATION RIGHT ARM;  Surgeon: Conrad McPherson, MD;  Location: [facility];  Service: Vascular;  Laterality: Right;  . Buttocks flap Left   . COLONOSCOPY    . EXTRACORPOREAL SHOCK WAVE LITHOTRIPSY  03/23/2011   x2  . FINGER SURGERY  2004  . hip flap Right   . INCISION AND DRAINAGE OF WOUND Left 09/05/2014   Procedure: IRRIGATION AND DEBRIDEMENT OF LEFT ISCHIUM WOUND WITH ;  Surgeon: Theodoro Kos, DO;  Location: [facility];  Service: Plastics;  Laterality: Left;  . INSERTION OF DIALYSIS CATHETER N/A 02/27/2016   Procedure: INSERTION OF DIALYSIS CATHETER-RIGHT INTERNAL JUGULA PLACEMENT;  Surgeon: Conrad [place], MD;  Location: [facility];  Service: Vascular;  Laterality: N/A;  . TONSILLECTOMY    . WOUND DEBRIDEMENT     [facility]/2/17[facility]    Social History   Occupational History  . Not on file.   Social History Main Topics  . Smoking status: Former Smoker    Types: Cigars    Quit date: 05/06/1983  . Smokeless tobacco: Never Used  . Alcohol use No     Comment: pt has not had any since Dec 2017  . Drug use: No  . Sexual activity: Not Currently    Birth control/ protection: None

## 2017-02-11 DIAGNOSIS — N186 End stage renal disease: Secondary | ICD-10-CM | POA: Diagnosis not present

## 2017-02-11 DIAGNOSIS — N2581 Secondary hyperparathyroidism of renal origin: Secondary | ICD-10-CM | POA: Diagnosis not present

## 2017-02-11 DIAGNOSIS — Z992 Dependence on renal dialysis: Secondary | ICD-10-CM | POA: Diagnosis not present

## 2017-02-11 DIAGNOSIS — E1122 Type 2 diabetes mellitus with diabetic chronic kidney disease: Secondary | ICD-10-CM | POA: Diagnosis not present

## 2017-02-11 DIAGNOSIS — D631 Anemia in chronic kidney disease: Secondary | ICD-10-CM | POA: Diagnosis not present

## 2017-02-12 DIAGNOSIS — I120 Hypertensive chronic kidney disease with stage 5 chronic kidney disease or end stage renal disease: Secondary | ICD-10-CM | POA: Diagnosis not present

## 2017-02-12 DIAGNOSIS — G35 Multiple sclerosis: Secondary | ICD-10-CM | POA: Diagnosis not present

## 2017-02-12 DIAGNOSIS — L97422 Non-pressure chronic ulcer of left heel and midfoot with fat layer exposed: Secondary | ICD-10-CM | POA: Diagnosis not present

## 2017-02-12 DIAGNOSIS — L89314 Pressure ulcer of right buttock, stage 4: Secondary | ICD-10-CM | POA: Diagnosis not present

## 2017-02-12 DIAGNOSIS — E11621 Type 2 diabetes mellitus with foot ulcer: Secondary | ICD-10-CM | POA: Diagnosis not present

## 2017-02-12 DIAGNOSIS — E1122 Type 2 diabetes mellitus with diabetic chronic kidney disease: Secondary | ICD-10-CM | POA: Diagnosis not present

## 2017-02-13 DIAGNOSIS — N186 End stage renal disease: Secondary | ICD-10-CM | POA: Diagnosis not present

## 2017-02-13 DIAGNOSIS — Z992 Dependence on renal dialysis: Secondary | ICD-10-CM | POA: Diagnosis not present

## 2017-02-13 DIAGNOSIS — N2581 Secondary hyperparathyroidism of renal origin: Secondary | ICD-10-CM | POA: Diagnosis not present

## 2017-02-13 DIAGNOSIS — E1122 Type 2 diabetes mellitus with diabetic chronic kidney disease: Secondary | ICD-10-CM | POA: Diagnosis not present

## 2017-02-13 DIAGNOSIS — D631 Anemia in chronic kidney disease: Secondary | ICD-10-CM | POA: Diagnosis not present

## 2017-02-15 DIAGNOSIS — E11621 Type 2 diabetes mellitus with foot ulcer: Secondary | ICD-10-CM | POA: Diagnosis not present

## 2017-02-15 DIAGNOSIS — G35 Multiple sclerosis: Secondary | ICD-10-CM | POA: Diagnosis not present

## 2017-02-15 DIAGNOSIS — E1122 Type 2 diabetes mellitus with diabetic chronic kidney disease: Secondary | ICD-10-CM | POA: Diagnosis not present

## 2017-02-15 DIAGNOSIS — L97422 Non-pressure chronic ulcer of left heel and midfoot with fat layer exposed: Secondary | ICD-10-CM | POA: Diagnosis not present

## 2017-02-15 DIAGNOSIS — I120 Hypertensive chronic kidney disease with stage 5 chronic kidney disease or end stage renal disease: Secondary | ICD-10-CM | POA: Diagnosis not present

## 2017-02-15 DIAGNOSIS — L89314 Pressure ulcer of right buttock, stage 4: Secondary | ICD-10-CM | POA: Diagnosis not present

## 2017-02-16 DIAGNOSIS — N2581 Secondary hyperparathyroidism of renal origin: Secondary | ICD-10-CM | POA: Diagnosis not present

## 2017-02-16 DIAGNOSIS — D631 Anemia in chronic kidney disease: Secondary | ICD-10-CM | POA: Diagnosis not present

## 2017-02-16 DIAGNOSIS — N186 End stage renal disease: Secondary | ICD-10-CM | POA: Diagnosis not present

## 2017-02-16 DIAGNOSIS — E1122 Type 2 diabetes mellitus with diabetic chronic kidney disease: Secondary | ICD-10-CM | POA: Diagnosis not present

## 2017-02-16 DIAGNOSIS — Z992 Dependence on renal dialysis: Secondary | ICD-10-CM | POA: Diagnosis not present

## 2017-02-17 DIAGNOSIS — L89314 Pressure ulcer of right buttock, stage 4: Secondary | ICD-10-CM | POA: Diagnosis not present

## 2017-02-17 DIAGNOSIS — E11621 Type 2 diabetes mellitus with foot ulcer: Secondary | ICD-10-CM | POA: Diagnosis not present

## 2017-02-17 DIAGNOSIS — G35 Multiple sclerosis: Secondary | ICD-10-CM | POA: Diagnosis not present

## 2017-02-17 DIAGNOSIS — E1122 Type 2 diabetes mellitus with diabetic chronic kidney disease: Secondary | ICD-10-CM | POA: Diagnosis not present

## 2017-02-17 DIAGNOSIS — I120 Hypertensive chronic kidney disease with stage 5 chronic kidney disease or end stage renal disease: Secondary | ICD-10-CM | POA: Diagnosis not present

## 2017-02-17 DIAGNOSIS — L97422 Non-pressure chronic ulcer of left heel and midfoot with fat layer exposed: Secondary | ICD-10-CM | POA: Diagnosis not present

## 2017-02-18 DIAGNOSIS — Z992 Dependence on renal dialysis: Secondary | ICD-10-CM | POA: Diagnosis not present

## 2017-02-18 DIAGNOSIS — D631 Anemia in chronic kidney disease: Secondary | ICD-10-CM | POA: Diagnosis not present

## 2017-02-18 DIAGNOSIS — N186 End stage renal disease: Secondary | ICD-10-CM | POA: Diagnosis not present

## 2017-02-18 DIAGNOSIS — N2581 Secondary hyperparathyroidism of renal origin: Secondary | ICD-10-CM | POA: Diagnosis not present

## 2017-02-18 DIAGNOSIS — E1122 Type 2 diabetes mellitus with diabetic chronic kidney disease: Secondary | ICD-10-CM | POA: Diagnosis not present

## 2017-02-19 DIAGNOSIS — L97422 Non-pressure chronic ulcer of left heel and midfoot with fat layer exposed: Secondary | ICD-10-CM | POA: Diagnosis not present

## 2017-02-19 DIAGNOSIS — E1122 Type 2 diabetes mellitus with diabetic chronic kidney disease: Secondary | ICD-10-CM | POA: Diagnosis not present

## 2017-02-19 DIAGNOSIS — E11621 Type 2 diabetes mellitus with foot ulcer: Secondary | ICD-10-CM | POA: Diagnosis not present

## 2017-02-19 DIAGNOSIS — L89314 Pressure ulcer of right buttock, stage 4: Secondary | ICD-10-CM | POA: Diagnosis not present

## 2017-02-19 DIAGNOSIS — G35 Multiple sclerosis: Secondary | ICD-10-CM | POA: Diagnosis not present

## 2017-02-19 DIAGNOSIS — I120 Hypertensive chronic kidney disease with stage 5 chronic kidney disease or end stage renal disease: Secondary | ICD-10-CM | POA: Diagnosis not present

## 2017-02-20 DIAGNOSIS — N2581 Secondary hyperparathyroidism of renal origin: Secondary | ICD-10-CM | POA: Diagnosis not present

## 2017-02-20 DIAGNOSIS — N186 End stage renal disease: Secondary | ICD-10-CM | POA: Diagnosis not present

## 2017-02-20 DIAGNOSIS — D631 Anemia in chronic kidney disease: Secondary | ICD-10-CM | POA: Diagnosis not present

## 2017-02-20 DIAGNOSIS — E1122 Type 2 diabetes mellitus with diabetic chronic kidney disease: Secondary | ICD-10-CM | POA: Diagnosis not present

## 2017-02-20 DIAGNOSIS — Z992 Dependence on renal dialysis: Secondary | ICD-10-CM | POA: Diagnosis not present

## 2017-02-22 ENCOUNTER — Encounter (HOSPITAL_BASED_OUTPATIENT_CLINIC_OR_DEPARTMENT_OTHER): Payer: Medicare Other | Attending: Internal Medicine

## 2017-02-22 DIAGNOSIS — N186 End stage renal disease: Secondary | ICD-10-CM | POA: Diagnosis not present

## 2017-02-22 DIAGNOSIS — Z992 Dependence on renal dialysis: Secondary | ICD-10-CM | POA: Diagnosis not present

## 2017-02-22 DIAGNOSIS — Z89422 Acquired absence of other left toe(s): Secondary | ICD-10-CM | POA: Diagnosis not present

## 2017-02-22 DIAGNOSIS — E1122 Type 2 diabetes mellitus with diabetic chronic kidney disease: Secondary | ICD-10-CM | POA: Diagnosis not present

## 2017-02-22 DIAGNOSIS — L89314 Pressure ulcer of right buttock, stage 4: Secondary | ICD-10-CM | POA: Insufficient documentation

## 2017-02-22 DIAGNOSIS — G35 Multiple sclerosis: Secondary | ICD-10-CM | POA: Diagnosis not present

## 2017-02-22 DIAGNOSIS — L89313 Pressure ulcer of right buttock, stage 3: Secondary | ICD-10-CM | POA: Diagnosis not present

## 2017-02-22 DIAGNOSIS — Z8614 Personal history of Methicillin resistant Staphylococcus aureus infection: Secondary | ICD-10-CM | POA: Diagnosis not present

## 2017-02-22 DIAGNOSIS — I120 Hypertensive chronic kidney disease with stage 5 chronic kidney disease or end stage renal disease: Secondary | ICD-10-CM | POA: Diagnosis not present

## 2017-02-23 DIAGNOSIS — Z992 Dependence on renal dialysis: Secondary | ICD-10-CM | POA: Diagnosis not present

## 2017-02-23 DIAGNOSIS — D631 Anemia in chronic kidney disease: Secondary | ICD-10-CM | POA: Diagnosis not present

## 2017-02-23 DIAGNOSIS — E1122 Type 2 diabetes mellitus with diabetic chronic kidney disease: Secondary | ICD-10-CM | POA: Diagnosis not present

## 2017-02-23 DIAGNOSIS — N2581 Secondary hyperparathyroidism of renal origin: Secondary | ICD-10-CM | POA: Diagnosis not present

## 2017-02-23 DIAGNOSIS — N186 End stage renal disease: Secondary | ICD-10-CM | POA: Diagnosis not present

## 2017-02-24 ENCOUNTER — Encounter (INDEPENDENT_AMBULATORY_CARE_PROVIDER_SITE_OTHER): Payer: Self-pay | Admitting: Family

## 2017-02-24 ENCOUNTER — Ambulatory Visit (INDEPENDENT_AMBULATORY_CARE_PROVIDER_SITE_OTHER): Payer: Medicare Other | Admitting: Family

## 2017-02-24 DIAGNOSIS — L89314 Pressure ulcer of right buttock, stage 4: Secondary | ICD-10-CM | POA: Diagnosis not present

## 2017-02-24 DIAGNOSIS — G35 Multiple sclerosis: Secondary | ICD-10-CM | POA: Diagnosis not present

## 2017-02-24 DIAGNOSIS — I120 Hypertensive chronic kidney disease with stage 5 chronic kidney disease or end stage renal disease: Secondary | ICD-10-CM | POA: Diagnosis not present

## 2017-02-24 DIAGNOSIS — E11621 Type 2 diabetes mellitus with foot ulcer: Secondary | ICD-10-CM | POA: Diagnosis not present

## 2017-02-24 DIAGNOSIS — Z89432 Acquired absence of left foot: Secondary | ICD-10-CM

## 2017-02-24 DIAGNOSIS — L97422 Non-pressure chronic ulcer of left heel and midfoot with fat layer exposed: Secondary | ICD-10-CM | POA: Diagnosis not present

## 2017-02-24 DIAGNOSIS — E1122 Type 2 diabetes mellitus with diabetic chronic kidney disease: Secondary | ICD-10-CM | POA: Diagnosis not present

## 2017-02-24 NOTE — Progress Notes (Signed)
Post-Op Visit Note   Patient: Lance [name]           Date of Birth: 1940/07/26           MRN: [id number] Visit Date: 02/24/2017 PCP: Lavone Orn, MD  Chief Complaint:  Chief Complaint  Patient presents with  . Right Foot - Routine Post Op    HPI:  HPI The patient is a 76 year old gentleman seen status post fifth ray amputation on the left. He is in a motorized wheelchair. does use a pressure relieving moon boot on left. Doing dry dressing changes.  Ortho Exam Incision left foot is well healed. no erythema or drainage minimal swelling no sign of infection. Does have thickened and onychomycotic nails x 4 left foot. Is unable to safely trim his own nails, were trimmed todAy without incident.   Visit Diagnoses:  1. Partial nontraumatic amputation of foot, left (HCC)     Plan: continue daily foot checks. Will follow up in office as needed. Routinely follows with wound care for sacral decubitus.  Follow-Up Instructions: Return if symptoms worsen or fail to improve.   Imaging: No results found.  Orders:  No orders of the defined types were placed in this encounter.  No orders of the defined types were placed in this encounter.    PMFS History: Patient Active Problem List   Diagnosis Date Noted  . Partial nontraumatic amputation of foot, left ([place]) 01/27/2017  . Malnutrition of moderate degree 07/03/2016  . Symptomatic anemia 07/02/2016  . SIRS (systemic inflammatory response syndrome) ([place]ishers[place]) 05/09/2016  . Melena 05/09/2016  . Hematemesis 05/09/2016  . Non-intractable vomiting with nausea 05/07/2016  . Altered mental status 04/21/2016  . Acute respiratory failure ([place])   . ESRD on dialysis ([place])   . NICM (nonischemic cardiomyopathy) (Wardens[place])   . Impaired functional mobility, balance, and endurance 02/17/2016  . Generalized anxiety disorder 02/17/2016  . Cardiomyopathy ([place]) 02/17/2016  . Hypoxia   . Acute pulmonary edema (HCC)   . Elevated troponin 02/14/2016   . Severe protein-calorie malnutrition ([place]) 02/14/2016  . Acute systolic heart failure ([place]) 02/14/2016  . Depression 02/13/2016  . DM (diabetes mellitus) type II controlled with renal manifestation ([place]) 09/06/2015  . Dyslipidemia associated with type 2 diabetes mellitus ([place]) 09/06/2015  . Recurrent UTI (urinary tract infection) 09/06/2015  . Decubitus ulcer of right ischial area 06/14/2015  . Decubitus ulcer of trochanter, stage 4 (Cal[place]) 06/14/2015  . Left perineal ischial pressure ulcer   . Acute osteomyelitis, pelvis, right ([place])   . Essential hypertension, benign 08/03/2013  . Spastic quadriplegia ([place]adena) 07/10/2011  . Multiple sclerosis ([place]) 03/30/2011  . Sacral decubitus ulcer 03/30/2011  . Progressive anemia 03/30/2011   Past Medical History:  Diagnosis Date  . Acute CHF (congestive heart failure) (Kear[place]) 02/14/2016  . Acute osteomyelitis, pelvis, right ([place])   . Acute renal failure ([place]yn[place])   . Anemia in chronic renal disease   . Blood transfusion   . Cardiomyopathy ([place]) 02/17/2016  . Chronic spastic paraplegia    secondary to MS  . Decreased sensation of lower extremity    due to MS  . Decubitus ulcer of ischium   . Decubitus ulcer of trochanter, stage 4 ([place]) 06/14/2015  . ESRD (end stage renal disease) ([place])    [place]  . History of kidney stones    x2  . History of MRSA infection    urine  . History of recurrent UTIs   . Hypertension   .  MS (multiple sclerosis) ([place])   . Neuralgia neuritis, sciatic nerve    MS for 30 years  . Neurogenic bladder   . PONV (postoperative nausea and vomiting)   . Presence of indwelling urinary catheter   . Self-catheterizes urinary bladder   . Type 2 diabetes mellitus (HCC)     Family History  Problem Relation Age of Onset  . Stroke Father   . Diabetes Father   . Diabetes Paternal Grandmother   . Stroke Paternal Grandfather   . Heart disease Neg Hx   . Cancer Neg Hx     Past Surgical History:    Procedure Laterality Date  . AMPUTATION Left 01/22/2017   Procedure: Left 5th Ray Amputation;  Surgeon: Newt Minion, MD;  Location: [facility];  Service: Orthopedics;  Laterality: Left;  . APPLICATION OF A-CELL OF EXTREMITY Left 09/05/2014   Procedure: PLACEMENT OF ACELL AND VAC;  Surgeon: Theodoro Kos, DO;  Location: [facility];  Service: Plastics;  Laterality: Left;  . BASCILIC VEIN TRANSPOSITION Right 03/24/2016   Procedure: RADIAL- CEPHALIC FISTULA CREATION RIGHT ARM;  Surgeon: Conrad Morral, MD;  Location: [facility];  Service: Vascular;  Laterality: Right;  . Buttocks flap Left   . COLONOSCOPY    . EXTRACORPOREAL SHOCK WAVE LITHOTRIPSY  03/23/2011   x2  . FINGER SURGERY  2004  . hip flap Right   . INCISION AND DRAINAGE OF WOUND Left 09/05/2014   Procedure: IRRIGATION AND DEBRIDEMENT OF LEFT ISCHIUM WOUND WITH ;  Surgeon: Theodoro Kos, DO;  Location: [facility];  Service: Plastics;  Laterality: Left;  . INSERTION OF DIALYSIS CATHETER N/A 02/27/2016   Procedure: INSERTION OF DIALYSIS CATHETER-RIGHT INTERNAL JUGULA PLACEMENT;  Surgeon: Conrad [place], MD;  Location: [facility];  Service: Vascular;  Laterality: N/A;  . TONSILLECTOMY    . WOUND DEBRIDEMENT     [facility]2/17[facility]    Social History   Occupational History  . Not on file.   Social History Main Topics  . Smoking status: Former Smoker    Types: Cigars    Quit date: 05/06/1983  . Smokeless tobacco: Never Used  . Alcohol use No     Comment: pt has not had any since Dec 2017  . Drug use: No  . Sexual activity: Not Currently    Birth control/ protection: None

## 2017-02-25 DIAGNOSIS — E1122 Type 2 diabetes mellitus with diabetic chronic kidney disease: Secondary | ICD-10-CM | POA: Diagnosis not present

## 2017-02-25 DIAGNOSIS — D631 Anemia in chronic kidney disease: Secondary | ICD-10-CM | POA: Diagnosis not present

## 2017-02-25 DIAGNOSIS — Z992 Dependence on renal dialysis: Secondary | ICD-10-CM | POA: Diagnosis not present

## 2017-02-25 DIAGNOSIS — N186 End stage renal disease: Secondary | ICD-10-CM | POA: Diagnosis not present

## 2017-02-25 DIAGNOSIS — N2581 Secondary hyperparathyroidism of renal origin: Secondary | ICD-10-CM | POA: Diagnosis not present

## 2017-02-26 DIAGNOSIS — E11621 Type 2 diabetes mellitus with foot ulcer: Secondary | ICD-10-CM | POA: Diagnosis not present

## 2017-02-26 DIAGNOSIS — L89314 Pressure ulcer of right buttock, stage 4: Secondary | ICD-10-CM | POA: Diagnosis not present

## 2017-02-26 DIAGNOSIS — I120 Hypertensive chronic kidney disease with stage 5 chronic kidney disease or end stage renal disease: Secondary | ICD-10-CM | POA: Diagnosis not present

## 2017-02-26 DIAGNOSIS — L97422 Non-pressure chronic ulcer of left heel and midfoot with fat layer exposed: Secondary | ICD-10-CM | POA: Diagnosis not present

## 2017-02-26 DIAGNOSIS — E1122 Type 2 diabetes mellitus with diabetic chronic kidney disease: Secondary | ICD-10-CM | POA: Diagnosis not present

## 2017-02-26 DIAGNOSIS — G35 Multiple sclerosis: Secondary | ICD-10-CM | POA: Diagnosis not present

## 2017-02-27 DIAGNOSIS — E1122 Type 2 diabetes mellitus with diabetic chronic kidney disease: Secondary | ICD-10-CM | POA: Diagnosis not present

## 2017-02-27 DIAGNOSIS — N186 End stage renal disease: Secondary | ICD-10-CM | POA: Diagnosis not present

## 2017-02-27 DIAGNOSIS — N2581 Secondary hyperparathyroidism of renal origin: Secondary | ICD-10-CM | POA: Diagnosis not present

## 2017-02-27 DIAGNOSIS — D631 Anemia in chronic kidney disease: Secondary | ICD-10-CM | POA: Diagnosis not present

## 2017-02-27 DIAGNOSIS — Z992 Dependence on renal dialysis: Secondary | ICD-10-CM | POA: Diagnosis not present

## 2017-03-01 DIAGNOSIS — L89314 Pressure ulcer of right buttock, stage 4: Secondary | ICD-10-CM | POA: Diagnosis not present

## 2017-03-01 DIAGNOSIS — I120 Hypertensive chronic kidney disease with stage 5 chronic kidney disease or end stage renal disease: Secondary | ICD-10-CM | POA: Diagnosis not present

## 2017-03-01 DIAGNOSIS — E11621 Type 2 diabetes mellitus with foot ulcer: Secondary | ICD-10-CM | POA: Diagnosis not present

## 2017-03-01 DIAGNOSIS — L97422 Non-pressure chronic ulcer of left heel and midfoot with fat layer exposed: Secondary | ICD-10-CM | POA: Diagnosis not present

## 2017-03-01 DIAGNOSIS — G35 Multiple sclerosis: Secondary | ICD-10-CM | POA: Diagnosis not present

## 2017-03-01 DIAGNOSIS — E1122 Type 2 diabetes mellitus with diabetic chronic kidney disease: Secondary | ICD-10-CM | POA: Diagnosis not present

## 2017-03-02 DIAGNOSIS — N186 End stage renal disease: Secondary | ICD-10-CM | POA: Diagnosis not present

## 2017-03-02 DIAGNOSIS — D631 Anemia in chronic kidney disease: Secondary | ICD-10-CM | POA: Diagnosis not present

## 2017-03-02 DIAGNOSIS — N2581 Secondary hyperparathyroidism of renal origin: Secondary | ICD-10-CM | POA: Diagnosis not present

## 2017-03-02 DIAGNOSIS — Z992 Dependence on renal dialysis: Secondary | ICD-10-CM | POA: Diagnosis not present

## 2017-03-02 DIAGNOSIS — E1122 Type 2 diabetes mellitus with diabetic chronic kidney disease: Secondary | ICD-10-CM | POA: Diagnosis not present

## 2017-03-03 DIAGNOSIS — Z992 Dependence on renal dialysis: Secondary | ICD-10-CM | POA: Diagnosis not present

## 2017-03-03 DIAGNOSIS — I120 Hypertensive chronic kidney disease with stage 5 chronic kidney disease or end stage renal disease: Secondary | ICD-10-CM | POA: Diagnosis not present

## 2017-03-03 DIAGNOSIS — E1122 Type 2 diabetes mellitus with diabetic chronic kidney disease: Secondary | ICD-10-CM | POA: Diagnosis not present

## 2017-03-03 DIAGNOSIS — G35 Multiple sclerosis: Secondary | ICD-10-CM | POA: Diagnosis not present

## 2017-03-03 DIAGNOSIS — L89314 Pressure ulcer of right buttock, stage 4: Secondary | ICD-10-CM | POA: Diagnosis not present

## 2017-03-03 DIAGNOSIS — N186 End stage renal disease: Secondary | ICD-10-CM | POA: Diagnosis not present

## 2017-03-03 DIAGNOSIS — E11621 Type 2 diabetes mellitus with foot ulcer: Secondary | ICD-10-CM | POA: Diagnosis not present

## 2017-03-03 DIAGNOSIS — L97422 Non-pressure chronic ulcer of left heel and midfoot with fat layer exposed: Secondary | ICD-10-CM | POA: Diagnosis not present

## 2017-03-04 DIAGNOSIS — D631 Anemia in chronic kidney disease: Secondary | ICD-10-CM | POA: Diagnosis not present

## 2017-03-04 DIAGNOSIS — E1122 Type 2 diabetes mellitus with diabetic chronic kidney disease: Secondary | ICD-10-CM | POA: Diagnosis not present

## 2017-03-04 DIAGNOSIS — N2581 Secondary hyperparathyroidism of renal origin: Secondary | ICD-10-CM | POA: Diagnosis not present

## 2017-03-04 DIAGNOSIS — N186 End stage renal disease: Secondary | ICD-10-CM | POA: Diagnosis not present

## 2017-03-04 DIAGNOSIS — Z992 Dependence on renal dialysis: Secondary | ICD-10-CM | POA: Diagnosis not present

## 2017-03-05 DIAGNOSIS — E1122 Type 2 diabetes mellitus with diabetic chronic kidney disease: Secondary | ICD-10-CM | POA: Diagnosis not present

## 2017-03-05 DIAGNOSIS — I120 Hypertensive chronic kidney disease with stage 5 chronic kidney disease or end stage renal disease: Secondary | ICD-10-CM | POA: Diagnosis not present

## 2017-03-05 DIAGNOSIS — E11621 Type 2 diabetes mellitus with foot ulcer: Secondary | ICD-10-CM | POA: Diagnosis not present

## 2017-03-05 DIAGNOSIS — G35 Multiple sclerosis: Secondary | ICD-10-CM | POA: Diagnosis not present

## 2017-03-05 DIAGNOSIS — L97422 Non-pressure chronic ulcer of left heel and midfoot with fat layer exposed: Secondary | ICD-10-CM | POA: Diagnosis not present

## 2017-03-05 DIAGNOSIS — L89314 Pressure ulcer of right buttock, stage 4: Secondary | ICD-10-CM | POA: Diagnosis not present

## 2017-03-06 DIAGNOSIS — D631 Anemia in chronic kidney disease: Secondary | ICD-10-CM | POA: Diagnosis not present

## 2017-03-06 DIAGNOSIS — Z992 Dependence on renal dialysis: Secondary | ICD-10-CM | POA: Diagnosis not present

## 2017-03-06 DIAGNOSIS — N2581 Secondary hyperparathyroidism of renal origin: Secondary | ICD-10-CM | POA: Diagnosis not present

## 2017-03-06 DIAGNOSIS — N186 End stage renal disease: Secondary | ICD-10-CM | POA: Diagnosis not present

## 2017-03-06 DIAGNOSIS — E1122 Type 2 diabetes mellitus with diabetic chronic kidney disease: Secondary | ICD-10-CM | POA: Diagnosis not present

## 2017-03-08 DIAGNOSIS — L97422 Non-pressure chronic ulcer of left heel and midfoot with fat layer exposed: Secondary | ICD-10-CM | POA: Diagnosis not present

## 2017-03-08 DIAGNOSIS — E1122 Type 2 diabetes mellitus with diabetic chronic kidney disease: Secondary | ICD-10-CM | POA: Diagnosis not present

## 2017-03-08 DIAGNOSIS — L89314 Pressure ulcer of right buttock, stage 4: Secondary | ICD-10-CM | POA: Diagnosis not present

## 2017-03-08 DIAGNOSIS — E11621 Type 2 diabetes mellitus with foot ulcer: Secondary | ICD-10-CM | POA: Diagnosis not present

## 2017-03-08 DIAGNOSIS — G35 Multiple sclerosis: Secondary | ICD-10-CM | POA: Diagnosis not present

## 2017-03-08 DIAGNOSIS — I120 Hypertensive chronic kidney disease with stage 5 chronic kidney disease or end stage renal disease: Secondary | ICD-10-CM | POA: Diagnosis not present

## 2017-03-09 DIAGNOSIS — N186 End stage renal disease: Secondary | ICD-10-CM | POA: Diagnosis not present

## 2017-03-09 DIAGNOSIS — D631 Anemia in chronic kidney disease: Secondary | ICD-10-CM | POA: Diagnosis not present

## 2017-03-09 DIAGNOSIS — N2581 Secondary hyperparathyroidism of renal origin: Secondary | ICD-10-CM | POA: Diagnosis not present

## 2017-03-09 DIAGNOSIS — Z992 Dependence on renal dialysis: Secondary | ICD-10-CM | POA: Diagnosis not present

## 2017-03-09 DIAGNOSIS — E1122 Type 2 diabetes mellitus with diabetic chronic kidney disease: Secondary | ICD-10-CM | POA: Diagnosis not present

## 2017-03-10 DIAGNOSIS — L89314 Pressure ulcer of right buttock, stage 4: Secondary | ICD-10-CM | POA: Diagnosis not present

## 2017-03-10 DIAGNOSIS — E11621 Type 2 diabetes mellitus with foot ulcer: Secondary | ICD-10-CM | POA: Diagnosis not present

## 2017-03-10 DIAGNOSIS — I120 Hypertensive chronic kidney disease with stage 5 chronic kidney disease or end stage renal disease: Secondary | ICD-10-CM | POA: Diagnosis not present

## 2017-03-10 DIAGNOSIS — G35 Multiple sclerosis: Secondary | ICD-10-CM | POA: Diagnosis not present

## 2017-03-10 DIAGNOSIS — E1122 Type 2 diabetes mellitus with diabetic chronic kidney disease: Secondary | ICD-10-CM | POA: Diagnosis not present

## 2017-03-10 DIAGNOSIS — L97422 Non-pressure chronic ulcer of left heel and midfoot with fat layer exposed: Secondary | ICD-10-CM | POA: Diagnosis not present

## 2017-03-11 DIAGNOSIS — N186 End stage renal disease: Secondary | ICD-10-CM | POA: Diagnosis not present

## 2017-03-11 DIAGNOSIS — D631 Anemia in chronic kidney disease: Secondary | ICD-10-CM | POA: Diagnosis not present

## 2017-03-11 DIAGNOSIS — E1122 Type 2 diabetes mellitus with diabetic chronic kidney disease: Secondary | ICD-10-CM | POA: Diagnosis not present

## 2017-03-11 DIAGNOSIS — Z992 Dependence on renal dialysis: Secondary | ICD-10-CM | POA: Diagnosis not present

## 2017-03-11 DIAGNOSIS — N2581 Secondary hyperparathyroidism of renal origin: Secondary | ICD-10-CM | POA: Diagnosis not present

## 2017-03-12 DIAGNOSIS — E1122 Type 2 diabetes mellitus with diabetic chronic kidney disease: Secondary | ICD-10-CM | POA: Diagnosis not present

## 2017-03-12 DIAGNOSIS — L89314 Pressure ulcer of right buttock, stage 4: Secondary | ICD-10-CM | POA: Diagnosis not present

## 2017-03-12 DIAGNOSIS — I120 Hypertensive chronic kidney disease with stage 5 chronic kidney disease or end stage renal disease: Secondary | ICD-10-CM | POA: Diagnosis not present

## 2017-03-12 DIAGNOSIS — L97422 Non-pressure chronic ulcer of left heel and midfoot with fat layer exposed: Secondary | ICD-10-CM | POA: Diagnosis not present

## 2017-03-12 DIAGNOSIS — G35 Multiple sclerosis: Secondary | ICD-10-CM | POA: Diagnosis not present

## 2017-03-12 DIAGNOSIS — E11621 Type 2 diabetes mellitus with foot ulcer: Secondary | ICD-10-CM | POA: Diagnosis not present

## 2017-03-13 DIAGNOSIS — E1122 Type 2 diabetes mellitus with diabetic chronic kidney disease: Secondary | ICD-10-CM | POA: Diagnosis not present

## 2017-03-13 DIAGNOSIS — N2581 Secondary hyperparathyroidism of renal origin: Secondary | ICD-10-CM | POA: Diagnosis not present

## 2017-03-13 DIAGNOSIS — Z992 Dependence on renal dialysis: Secondary | ICD-10-CM | POA: Diagnosis not present

## 2017-03-13 DIAGNOSIS — D631 Anemia in chronic kidney disease: Secondary | ICD-10-CM | POA: Diagnosis not present

## 2017-03-13 DIAGNOSIS — N186 End stage renal disease: Secondary | ICD-10-CM | POA: Diagnosis not present

## 2017-03-15 DIAGNOSIS — E11621 Type 2 diabetes mellitus with foot ulcer: Secondary | ICD-10-CM | POA: Diagnosis not present

## 2017-03-15 DIAGNOSIS — I120 Hypertensive chronic kidney disease with stage 5 chronic kidney disease or end stage renal disease: Secondary | ICD-10-CM | POA: Diagnosis not present

## 2017-03-15 DIAGNOSIS — L89314 Pressure ulcer of right buttock, stage 4: Secondary | ICD-10-CM | POA: Diagnosis not present

## 2017-03-15 DIAGNOSIS — G35 Multiple sclerosis: Secondary | ICD-10-CM | POA: Diagnosis not present

## 2017-03-15 DIAGNOSIS — E1122 Type 2 diabetes mellitus with diabetic chronic kidney disease: Secondary | ICD-10-CM | POA: Diagnosis not present

## 2017-03-15 DIAGNOSIS — L97422 Non-pressure chronic ulcer of left heel and midfoot with fat layer exposed: Secondary | ICD-10-CM | POA: Diagnosis not present

## 2017-03-16 DIAGNOSIS — N186 End stage renal disease: Secondary | ICD-10-CM | POA: Diagnosis not present

## 2017-03-16 DIAGNOSIS — D631 Anemia in chronic kidney disease: Secondary | ICD-10-CM | POA: Diagnosis not present

## 2017-03-16 DIAGNOSIS — N2581 Secondary hyperparathyroidism of renal origin: Secondary | ICD-10-CM | POA: Diagnosis not present

## 2017-03-16 DIAGNOSIS — E1122 Type 2 diabetes mellitus with diabetic chronic kidney disease: Secondary | ICD-10-CM | POA: Diagnosis not present

## 2017-03-16 DIAGNOSIS — Z992 Dependence on renal dialysis: Secondary | ICD-10-CM | POA: Diagnosis not present

## 2017-03-17 DIAGNOSIS — I429 Cardiomyopathy, unspecified: Secondary | ICD-10-CM | POA: Diagnosis not present

## 2017-03-17 DIAGNOSIS — E1122 Type 2 diabetes mellitus with diabetic chronic kidney disease: Secondary | ICD-10-CM | POA: Diagnosis not present

## 2017-03-17 DIAGNOSIS — R1312 Dysphagia, oropharyngeal phase: Secondary | ICD-10-CM | POA: Diagnosis not present

## 2017-03-17 DIAGNOSIS — I120 Hypertensive chronic kidney disease with stage 5 chronic kidney disease or end stage renal disease: Secondary | ICD-10-CM | POA: Diagnosis not present

## 2017-03-17 DIAGNOSIS — Z87440 Personal history of urinary (tract) infections: Secondary | ICD-10-CM | POA: Diagnosis not present

## 2017-03-17 DIAGNOSIS — E11621 Type 2 diabetes mellitus with foot ulcer: Secondary | ICD-10-CM | POA: Diagnosis not present

## 2017-03-17 DIAGNOSIS — Z466 Encounter for fitting and adjustment of urinary device: Secondary | ICD-10-CM | POA: Diagnosis not present

## 2017-03-17 DIAGNOSIS — N186 End stage renal disease: Secondary | ICD-10-CM | POA: Diagnosis not present

## 2017-03-17 DIAGNOSIS — L97422 Non-pressure chronic ulcer of left heel and midfoot with fat layer exposed: Secondary | ICD-10-CM | POA: Diagnosis not present

## 2017-03-17 DIAGNOSIS — G35 Multiple sclerosis: Secondary | ICD-10-CM | POA: Diagnosis not present

## 2017-03-17 DIAGNOSIS — Z794 Long term (current) use of insulin: Secondary | ICD-10-CM | POA: Diagnosis not present

## 2017-03-17 DIAGNOSIS — Z992 Dependence on renal dialysis: Secondary | ICD-10-CM | POA: Diagnosis not present

## 2017-03-17 DIAGNOSIS — Z8701 Personal history of pneumonia (recurrent): Secondary | ICD-10-CM | POA: Diagnosis not present

## 2017-03-17 DIAGNOSIS — D631 Anemia in chronic kidney disease: Secondary | ICD-10-CM | POA: Diagnosis not present

## 2017-03-17 DIAGNOSIS — L89314 Pressure ulcer of right buttock, stage 4: Secondary | ICD-10-CM | POA: Diagnosis not present

## 2017-03-17 DIAGNOSIS — E785 Hyperlipidemia, unspecified: Secondary | ICD-10-CM | POA: Diagnosis not present

## 2017-03-17 DIAGNOSIS — N319 Neuromuscular dysfunction of bladder, unspecified: Secondary | ICD-10-CM | POA: Diagnosis not present

## 2017-03-18 ENCOUNTER — Other Ambulatory Visit: Payer: Self-pay

## 2017-03-18 ENCOUNTER — Emergency Department (HOSPITAL_COMMUNITY): Payer: Medicare Other

## 2017-03-18 ENCOUNTER — Encounter (HOSPITAL_COMMUNITY): Payer: Self-pay | Admitting: Emergency Medicine

## 2017-03-18 ENCOUNTER — Inpatient Hospital Stay (HOSPITAL_COMMUNITY)
Admission: EM | Admit: 2017-03-18 | Discharge: 2017-03-20 | DRG: 871 | Disposition: A | Discharge status: Home / Self Care | Payer: Medicare Other | Attending: Family Medicine | Admitting: Family Medicine

## 2017-03-18 DIAGNOSIS — I429 Cardiomyopathy, unspecified: Secondary | ICD-10-CM | POA: Diagnosis present

## 2017-03-18 DIAGNOSIS — R112 Nausea with vomiting, unspecified: Secondary | ICD-10-CM | POA: Diagnosis not present

## 2017-03-18 DIAGNOSIS — A419 Sepsis, unspecified organism: Principal | ICD-10-CM | POA: Diagnosis present

## 2017-03-18 DIAGNOSIS — N390 Urinary tract infection, site not specified: Secondary | ICD-10-CM | POA: Diagnosis not present

## 2017-03-18 DIAGNOSIS — E440 Moderate protein-calorie malnutrition: Secondary | ICD-10-CM | POA: Diagnosis not present

## 2017-03-18 DIAGNOSIS — B961 Klebsiella pneumoniae [K. pneumoniae] as the cause of diseases classified elsewhere: Secondary | ICD-10-CM | POA: Diagnosis present

## 2017-03-18 DIAGNOSIS — Z66 Do not resuscitate: Secondary | ICD-10-CM | POA: Diagnosis present

## 2017-03-18 DIAGNOSIS — Z87891 Personal history of nicotine dependence: Secondary | ICD-10-CM

## 2017-03-18 DIAGNOSIS — L89154 Pressure ulcer of sacral region, stage 4: Secondary | ICD-10-CM | POA: Diagnosis present

## 2017-03-18 DIAGNOSIS — R0602 Shortness of breath: Secondary | ICD-10-CM | POA: Diagnosis not present

## 2017-03-18 DIAGNOSIS — Z8614 Personal history of Methicillin resistant Staphylococcus aureus infection: Secondary | ICD-10-CM

## 2017-03-18 DIAGNOSIS — N186 End stage renal disease: Secondary | ICD-10-CM | POA: Diagnosis not present

## 2017-03-18 DIAGNOSIS — Z6820 Body mass index (BMI) 20.0-20.9, adult: Secondary | ICD-10-CM

## 2017-03-18 DIAGNOSIS — S31809A Unspecified open wound of unspecified buttock, initial encounter: Secondary | ICD-10-CM | POA: Diagnosis not present

## 2017-03-18 DIAGNOSIS — G35D Multiple sclerosis, unspecified: Secondary | ICD-10-CM | POA: Diagnosis present

## 2017-03-18 DIAGNOSIS — Z1612 Extended spectrum beta lactamase (ESBL) resistance: Secondary | ICD-10-CM | POA: Diagnosis present

## 2017-03-18 DIAGNOSIS — G35 Multiple sclerosis: Secondary | ICD-10-CM | POA: Diagnosis present

## 2017-03-18 DIAGNOSIS — Z992 Dependence on renal dialysis: Secondary | ICD-10-CM

## 2017-03-18 DIAGNOSIS — Z79899 Other long term (current) drug therapy: Secondary | ICD-10-CM | POA: Diagnosis not present

## 2017-03-18 DIAGNOSIS — E1122 Type 2 diabetes mellitus with diabetic chronic kidney disease: Secondary | ICD-10-CM | POA: Diagnosis present

## 2017-03-18 DIAGNOSIS — N2581 Secondary hyperparathyroidism of renal origin: Secondary | ICD-10-CM | POA: Diagnosis present

## 2017-03-18 DIAGNOSIS — I120 Hypertensive chronic kidney disease with stage 5 chronic kidney disease or end stage renal disease: Secondary | ICD-10-CM | POA: Diagnosis not present

## 2017-03-18 DIAGNOSIS — Z89422 Acquired absence of other left toe(s): Secondary | ICD-10-CM

## 2017-03-18 DIAGNOSIS — Z794 Long term (current) use of insulin: Secondary | ICD-10-CM

## 2017-03-18 DIAGNOSIS — I132 Hypertensive heart and chronic kidney disease with heart failure and with stage 5 chronic kidney disease, or end stage renal disease: Secondary | ICD-10-CM | POA: Diagnosis present

## 2017-03-18 DIAGNOSIS — N319 Neuromuscular dysfunction of bladder, unspecified: Secondary | ICD-10-CM | POA: Diagnosis present

## 2017-03-18 DIAGNOSIS — I5042 Chronic combined systolic (congestive) and diastolic (congestive) heart failure: Secondary | ICD-10-CM | POA: Diagnosis present

## 2017-03-18 DIAGNOSIS — E43 Unspecified severe protein-calorie malnutrition: Secondary | ICD-10-CM | POA: Diagnosis not present

## 2017-03-18 DIAGNOSIS — R05 Cough: Secondary | ICD-10-CM | POA: Diagnosis not present

## 2017-03-18 DIAGNOSIS — D631 Anemia in chronic kidney disease: Secondary | ICD-10-CM | POA: Diagnosis present

## 2017-03-18 DIAGNOSIS — G8220 Paraplegia, unspecified: Secondary | ICD-10-CM | POA: Diagnosis present

## 2017-03-18 DIAGNOSIS — L89159 Pressure ulcer of sacral region, unspecified stage: Secondary | ICD-10-CM | POA: Diagnosis present

## 2017-03-18 DIAGNOSIS — R509 Fever, unspecified: Secondary | ICD-10-CM | POA: Diagnosis not present

## 2017-03-18 DIAGNOSIS — R6520 Severe sepsis without septic shock: Secondary | ICD-10-CM | POA: Diagnosis not present

## 2017-03-18 LAB — CBC WITH DIFFERENTIAL/PLATELET
Basophils Absolute: 0 10*3/uL (ref 0.0–0.1)
Basophils Absolute: 0 10*3/uL (ref 0.0–0.1)
Basophils Relative: 0 %
Basophils Relative: 0 %
EOS ABS: 0 10*3/uL (ref 0.0–0.7)
Eosinophils Absolute: 0.2 10*3/uL (ref 0.0–0.7)
Eosinophils Relative: 3 %
Eosinophils Relative: 0 %
HCT: 34.2 % — ABNORMAL LOW (ref 39.0–52.0)
HEMATOCRIT: 29.3 % — AB (ref 39.0–52.0)
HEMOGLOBIN: 9.3 g/dL — AB (ref 13.0–17.0)
Hemoglobin: 10.7 g/dL — ABNORMAL LOW (ref 13.0–17.0)
LYMPHS ABS: 0.5 10*3/uL — AB (ref 0.7–4.0)
Lymphocytes Relative: 7 %
Lymphocytes Relative: 4 %
Lymphs Abs: 0.4 10*3/uL — ABNORMAL LOW (ref 0.7–4.0)
MCH: 30.7 pg (ref 26.0–34.0)
MCH: 31.1 pg (ref 26.0–34.0)
MCHC: 31.3 g/dL (ref 30.0–36.0)
MCHC: 31.7 g/dL (ref 30.0–36.0)
MCV: 98.3 fL (ref 78.0–100.0)
MCV: 98 fL (ref 78.0–100.0)
MONOS PCT: 8 %
Monocytes Absolute: 0 10*3/uL — ABNORMAL LOW (ref 0.1–1.0)
Monocytes Absolute: 1 10*3/uL (ref 0.1–1.0)
Monocytes Relative: 1 %
NEUTROS ABS: 10.3 10*3/uL — AB (ref 1.7–7.7)
NEUTROS PCT: 88 %
Neutro Abs: 5.2 10*3/uL (ref 1.7–7.7)
Neutrophils Relative %: 89 %
Platelets: 137 10*3/uL — ABNORMAL LOW (ref 150–400)
Platelets: 143 10*3/uL — ABNORMAL LOW (ref 150–400)
RBC: 3.48 MIL/uL — ABNORMAL LOW (ref 4.22–5.81)
RBC: 2.99 MIL/uL — AB (ref 4.22–5.81)
RDW: 19.1 % — ABNORMAL HIGH (ref 11.5–15.5)
RDW: 19.6 % — ABNORMAL HIGH (ref 11.5–15.5)
WBC: 5.8 10*3/uL (ref 4.0–10.5)
WBC: 11.8 10*3/uL — AB (ref 4.0–10.5)

## 2017-03-18 LAB — COMPREHENSIVE METABOLIC PANEL
ALBUMIN: 2.4 g/dL — AB (ref 3.5–5.0)
ALK PHOS: 65 U/L (ref 38–126)
ALT: 9 U/L — ABNORMAL LOW (ref 17–63)
ALT: 7 U/L — AB (ref 17–63)
AST: 15 U/L (ref 15–41)
AST: 12 U/L — ABNORMAL LOW (ref 15–41)
Albumin: 2.7 g/dL — ABNORMAL LOW (ref 3.5–5.0)
Alkaline Phosphatase: 85 U/L (ref 38–126)
Anion gap: 12 (ref 5–15)
Anion gap: 11 (ref 5–15)
BILIRUBIN TOTAL: 1.1 mg/dL (ref 0.3–1.2)
BUN: 58 mg/dL — ABNORMAL HIGH (ref 6–20)
BUN: 56 mg/dL — ABNORMAL HIGH (ref 6–20)
CALCIUM: 8 mg/dL — AB (ref 8.9–10.3)
CO2: 26 mmol/L (ref 22–32)
CO2: 22 mmol/L (ref 22–32)
CREATININE: 3.74 mg/dL — AB (ref 0.61–1.24)
Calcium: 8.7 mg/dL — ABNORMAL LOW (ref 8.9–10.3)
Chloride: 99 mmol/L — ABNORMAL LOW (ref 101–111)
Chloride: 101 mmol/L (ref 101–111)
Creatinine, Ser: 3.69 mg/dL — ABNORMAL HIGH (ref 0.61–1.24)
GFR calc Af Amer: 17 mL/min — ABNORMAL LOW (ref >60)
GFR calc non Af Amer: 15 mL/min — ABNORMAL LOW (ref >60)
GFR calc non Af Amer: 15 mL/min — ABNORMAL LOW (ref >60)
GFR, EST AFRICAN AMERICAN: 17 mL/min — AB (ref >60)
GLUCOSE: 167 mg/dL — AB (ref 65–99)
Glucose, Bld: 128 mg/dL — ABNORMAL HIGH (ref 65–99)
Potassium: 4.2 mmol/L (ref 3.5–5.1)
Potassium: 3.7 mmol/L (ref 3.5–5.1)
SODIUM: 134 mmol/L — AB (ref 135–145)
Sodium: 137 mmol/L (ref 135–145)
TOTAL PROTEIN: 6.5 g/dL (ref 6.5–8.1)
Total Bilirubin: 1.2 mg/dL (ref 0.3–1.2)
Total Protein: 7.7 g/dL (ref 6.5–8.1)

## 2017-03-18 LAB — URINALYSIS, ROUTINE W REFLEX MICROSCOPIC
Bilirubin Urine: NEGATIVE
GLUCOSE, UA: NEGATIVE mg/dL
Ketones, ur: NEGATIVE mg/dL
Nitrite: NEGATIVE
PH: 7 (ref 5.0–8.0)
Protein, ur: 30 mg/dL — AB
SQUAMOUS EPITHELIAL / LPF: NONE SEEN
Specific Gravity, Urine: 1.006 (ref 1.005–1.030)

## 2017-03-18 LAB — PROTIME-INR
INR: 1.11
INR: 1.18
Prothrombin Time: 14.3 seconds (ref 11.4–15.2)
Prothrombin Time: 14.9 seconds (ref 11.4–15.2)

## 2017-03-18 LAB — CBG MONITORING, ED: GLUCOSE-CAPILLARY: 235 mg/dL — AB (ref 65–99)

## 2017-03-18 LAB — INFLUENZA PANEL BY PCR (TYPE A & B)
INFLBPCR: NEGATIVE
Influenza A By PCR: NEGATIVE

## 2017-03-18 LAB — LACTIC ACID, PLASMA: Lactic Acid, Venous: 0.8 mmol/L (ref 0.5–1.9)

## 2017-03-18 LAB — I-STAT CG4 LACTIC ACID, ED: LACTIC ACID, VENOUS: 1.61 mmol/L (ref 0.5–1.9)

## 2017-03-18 LAB — APTT: aPTT: 35 seconds (ref 24–36)

## 2017-03-18 LAB — PROCALCITONIN: Procalcitonin: 25.93 ng/mL

## 2017-03-18 MED ORDER — CARVEDILOL 3.125 MG PO TABS
3.1250 mg | ORAL_TABLET | Freq: Two times a day (BID) | ORAL | Status: DC
  Administered 2017-03-18 – 2017-03-20 (×3): 3.125 mg via ORAL
  Filled 2017-03-18 (×3): qty 1

## 2017-03-18 MED ORDER — SODIUM CHLORIDE 0.9 % IV BOLUS (SEPSIS)
500.0000 mL | Freq: Once | INTRAVENOUS | Status: AC
  Administered 2017-03-18: 500 mL via INTRAVENOUS

## 2017-03-18 MED ORDER — ONDANSETRON HCL 4 MG/2ML IJ SOLN: 4.0000 mg | Freq: Four times a day (QID) | INTRAMUSCULAR | Status: DC | PRN

## 2017-03-18 MED ORDER — INSULIN ASPART 100 UNIT/ML ~~LOC~~ SOLN
0.0000 [IU] | Freq: Three times a day (TID) | SUBCUTANEOUS | Status: DC
Start: 2017-03-18 — End: 2017-03-19

## 2017-03-18 MED ORDER — ACETAMINOPHEN 500 MG PO TABS
1000.0000 mg | ORAL_TABLET | Freq: Once | ORAL | Status: AC
  Administered 2017-03-18: 1000 mg via ORAL
  Filled 2017-03-18: qty 2

## 2017-03-18 MED ORDER — PEN NEEDLES 30G X 8 MM MISC: 1.0000 | Freq: Four times a day (QID) | Status: DC

## 2017-03-18 MED ORDER — CEFEPIME HCL 1 G IJ SOLR
1.0000 g | INTRAMUSCULAR | Status: DC
  Filled 2017-03-18: qty 1

## 2017-03-18 MED ORDER — DARBEPOETIN ALFA 150 MCG/0.3ML IJ SOSY: 150.0000 ug | PREFILLED_SYRINGE | INTRAMUSCULAR | Status: DC

## 2017-03-18 MED ORDER — MELATONIN 3 MG PO TABS
3.0000 mg | ORAL_TABLET | Freq: Every day | ORAL | Status: DC
  Administered 2017-03-18 – 2017-03-19 (×2): 3 mg via ORAL
  Filled 2017-03-18 (×2): qty 1

## 2017-03-18 MED ORDER — ENOXAPARIN SODIUM 30 MG/0.3ML ~~LOC~~ SOLN: 30.0000 mg | SUBCUTANEOUS | Status: DC

## 2017-03-18 MED ORDER — SODIUM CHLORIDE 0.9 % IV BOLUS (SEPSIS): 1000.0000 mL | Freq: Once | INTRAVENOUS | Status: DC

## 2017-03-18 MED ORDER — BACLOFEN 10 MG PO TABS: 10.0000 mg | ORAL_TABLET | Freq: Every evening | ORAL | Status: DC | PRN

## 2017-03-18 MED ORDER — SODIUM CHLORIDE 0.9 % IV SOLN
500.0000 mg | Freq: Once | INTRAVENOUS | Status: AC
  Administered 2017-03-18: 500 mg via INTRAVENOUS
  Filled 2017-03-18: qty 0.5

## 2017-03-18 MED ORDER — INSULIN DETEMIR 100 UNIT/ML ~~LOC~~ SOLN
9.0000 [IU] | Freq: Every day | SUBCUTANEOUS | Status: DC
  Administered 2017-03-19 – 2017-03-20 (×2): 9 [IU] via SUBCUTANEOUS
  Filled 2017-03-18 (×2): qty 0.09

## 2017-03-18 MED ORDER — SODIUM CHLORIDE 0.9 % IV SOLN
500.0000 mg | INTRAVENOUS | Status: DC
  Administered 2017-03-19 – 2017-03-20 (×2): 500 mg via INTRAVENOUS
  Filled 2017-03-18 (×2): qty 0.5

## 2017-03-18 MED ORDER — ONDANSETRON HCL 4 MG PO TABS: 4.0000 mg | ORAL_TABLET | Freq: Four times a day (QID) | ORAL | Status: DC | PRN

## 2017-03-18 MED ORDER — LIDOCAINE-PRILOCAINE 2.5-2.5 % EX CREA: 1.0000 "application " | TOPICAL_CREAM | Freq: Once | CUTANEOUS | Status: DC

## 2017-03-18 MED ORDER — SIMVASTATIN 20 MG PO TABS
20.0000 mg | ORAL_TABLET | Freq: Every evening | ORAL | Status: DC
  Administered 2017-03-18 – 2017-03-19 (×2): 20 mg via ORAL
  Filled 2017-03-18 (×2): qty 1

## 2017-03-18 MED ORDER — HYDROCODONE-ACETAMINOPHEN 5-325 MG PO TABS
1.0000 | ORAL_TABLET | ORAL | Status: DC | PRN
  Filled 2017-03-18: qty 2

## 2017-03-18 MED ORDER — VANCOMYCIN HCL IN DEXTROSE 750-5 MG/150ML-% IV SOLN
750.0000 mg | INTRAVENOUS | Status: DC
  Filled 2017-03-18: qty 150

## 2017-03-18 MED ORDER — ACETAMINOPHEN 650 MG RE SUPP: 650.0000 mg | Freq: Four times a day (QID) | RECTAL | Status: DC | PRN

## 2017-03-18 MED ORDER — VANCOMYCIN HCL 10 G IV SOLR
1500.0000 mg | Freq: Once | INTRAVENOUS | Status: AC
  Administered 2017-03-18: 1500 mg via INTRAVENOUS
  Filled 2017-03-18: qty 1500

## 2017-03-18 MED ORDER — SODIUM CHLORIDE 0.9 % IV BOLUS (SEPSIS): 500.0000 mL | Freq: Once | INTRAVENOUS | Status: DC

## 2017-03-18 MED ORDER — ACETAMINOPHEN 325 MG PO TABS: 650.0000 mg | ORAL_TABLET | Freq: Four times a day (QID) | ORAL | Status: DC | PRN

## 2017-03-18 MED ORDER — VANCOMYCIN HCL IN DEXTROSE 750-5 MG/150ML-% IV SOLN
750.0000 mg | INTRAVENOUS | Status: DC
  Administered 2017-03-18: 750 mg via INTRAVENOUS
  Filled 2017-03-18: qty 150

## 2017-03-18 MED ORDER — BISACODYL 10 MG RE SUPP: 10.0000 mg | RECTAL | Status: DC | PRN

## 2017-03-18 MED ORDER — HYDROCERIN EX CREA
1.0000 "application " | TOPICAL_CREAM | Freq: Every day | CUTANEOUS | Status: DC
  Administered 2017-03-20: 1 via TOPICAL
  Filled 2017-03-18: qty 113

## 2017-03-18 MED ORDER — DARBEPOETIN ALFA 100 MCG/0.5ML IJ SOSY: 100.0000 ug | PREFILLED_SYRINGE | INTRAMUSCULAR | Status: DC

## 2017-03-18 MED ORDER — VANCOMYCIN HCL IN DEXTROSE 750-5 MG/150ML-% IV SOLN: 750.0000 mg | INTRAVENOUS | Status: DC

## 2017-03-18 NOTE — ED Notes (Signed)
Pt desiring to rest. Cath bag emptied. Wound vac in place and going. Lance [name] almost done; una boots in place for heel blisters and breakdown. No needs at this time.

## 2017-03-18 NOTE — ED Notes (Signed)
Meal tray at bedside; RN assisted in getting it set up for pt. He was appreciative

## 2017-03-18 NOTE — ED Notes (Signed)
Instructed to wait until after patient receives antibiotics to change foley catheter - per Dr. Ellender Hose.

## 2017-03-18 NOTE — ED Triage Notes (Signed)
Patient is from home, has dialysis on [address] [place], [place], Sat.  Changed dry weight (increased) to 71kg, had full dialysis on Tuesday and just not feeling well today.  Patient is feeling febrile.  Patient having tremors.  Had a urinalysis yesterday while was reported back to him with no blood or infection per patient.  Patient with cloudy and bloody urine in his foley bag this am.  Patient is CAOx4.

## 2017-03-18 NOTE — ED Notes (Signed)
X-ray at bedside

## 2017-03-18 NOTE — Consult Note (Signed)
[facility] Renal Consultation Note    Indication for Consultation:  Management of ESRD/hemodialysis; anemia, hypertension/volume and secondary hyperparathyroidism PCP: Dr. Lavone Orn   HPI: Lance [name] is a 76 y.o. male with ESRD 2/2 to DM on hemodialysis T,Th,S at [facility]. PMH significant for Multiple Sclerosis, R ischial pressure ulcer with osteomyelitis, neurogenic bladder with chronic indwelling foley, Cardiomyopathy with EF 35-40%, MRSA. Last HD 03/16/2017. Ran full treatment, left 0.6 above EDW (raised 0.5 kg 03/16/17).  Patient states he developed chills last PM with nausea, "dry heaves" and weakness. He says he noticed cloudy urine. He presented to ED this AM for evaluation. Temperature on arrival to ED was 103. BP 154/77 HR 124 RR 25. Lactic acid 1.61 WBC 5.8 procalcitonin 25.93. CXR unremarkable. BC and urine cultures were collected and he has been started on Vanc/Meropenem per primary. He received 1 liter NS. He is being admitted with urosepsis per primary.  Patient lives at home, has home health assisting. 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 He is compliant with HD Rx.   Past Medical History:  Diagnosis Date  . Acute CHF (congestive heart failure) ([place]) 02/14/2016  . Acute osteomyelitis, pelvis, right ([place])   . Acute renal failure ([place])   . Anemia in chronic renal disease   . Blood transfusion   . Cardiomyopathy ([place]) 02/17/2016  . Chronic spastic paraplegia    secondary to MS  . Decreased sensation of lower extremity    due to MS  . Decubitus ulcer of ischium   . Decubitus ulcer of trochanter, stage 4 ([place]) 06/14/2015  . ESRD (end stage renal disease) ([place])    [place]  . History of kidney stones    x2  . History of MRSA infection    urine  . History of recurrent UTIs   . Hypertension   . MS (multiple sclerosis) ([place])   . Neuralgia neuritis, sciatic nerve    MS for 30 years  .  Neurogenic bladder   . PONV (postoperative nausea and vomiting)   . Presence of indwelling urinary catheter   . Self-catheterizes urinary bladder   . Type 2 diabetes mellitus ([place])    Past Surgical History:  Procedure Laterality Date  . AMPUTATION Left 01/22/2017   Procedure: Left 5th Ray Amputation;  Surgeon: Newt Minion, MD;  Location: [place];  Service: Orthopedics;  Laterality: Left;  . APPLICATION OF A-CELL OF EXTREMITY Left 09/05/2014   Procedure: PLACEMENT OF ACELL AND VAC;  Surgeon: Theodoro Kos, DO;  Location: [place];  Service: Plastics;  Laterality: Left;  . BASCILIC VEIN TRANSPOSITION Right 03/24/2016   Procedure: RADIAL- CEPHALIC FISTULA CREATION RIGHT ARM;  Surgeon: Conrad [place], MD;  Location: [place];  Service: Vascular;  Laterality: Right;  . Buttocks flap Left   . COLONOSCOPY    . EXTRACORPOREAL SHOCK WAVE LITHOTRIPSY  03/23/2011   x2  . FINGER SURGERY  2004  . hip flap Right   . INCISION AND DRAINAGE OF WOUND Left 09/05/2014   Procedure: IRRIGATION AND DEBRIDEMENT OF LEFT ISCHIUM WOUND WITH ;  Surgeon: Theodoro Kos, DO;  Location: [place];  Service: Plastics;  Laterality: Left;  . INSERTION OF DIALYSIS CATHETER N/A 02/27/2016   Procedure: INSERTION OF DIALYSIS CATHETER-RIGHT INTERNAL JUGULA PLACEMENT;  Surgeon: Conrad T[place], MD;  Location: [place];  Service: Vascular;  Laterality: N/A;  . TONSILLECTOMY    . WOUND DEBRIDEMENT     [facility]/2/17[facility]  Family History  Problem Relation Age of Onset  . Stroke Father   . Diabetes Father   . Diabetes Paternal Grandmother   . Stroke Paternal Grandfather   . Heart disease Neg Hx   . Cancer Neg Hx    Social History:  reports that he quit smoking about 33 years ago. His smoking use included cigars. he has never used smokeless tobacco. He reports that he does not drink alcohol or use drugs. No Known Allergies Prior to Admission medications   Medication Sig Start Date End Date Taking? Authorizing Provider  baclofen (LIORESAL) 10 MG  tablet Take 1 tablet (10 mg total) by mouth at bedtime as needed for muscle spasms. 07/15/16  Yes Lauree Chandler, NP  carvedilol (COREG) 3.125 MG tablet Take 1 tablet (3.125 mg total) by mouth 2 (two) times daily with a meal. 07/15/16  Yes Eubanks, Carlos American, NP  insulin aspart (NOVOLOG FLEXPEN) 100 UNIT/ML FlexPen Inject 0-9 Units into the skin 3 (three) times daily with meals. Per sliding scale Patient taking differently: Inject 7 Units 3 (three) times daily with meals into the skin. Per sliding scale 07/20/16  Yes Eubanks, Carlos American, NP  insulin detemir (LEVEMIR) 100 UNIT/ML injection Inject 9 Units into the skin daily with breakfast.   Yes [provider]  Insulin Pen Needle (PEN NEEDLES) 30G X 8 MM MISC Inject 1 each into the skin 4 (four) times daily. . 07/20/16  Yes Lauree Chandler, NP  lidocaine-prilocaine (EMLA) cream Apply 1 application topically as needed. 07/24/16  Yes Conrad [place], MD  Melatonin 3 MG TABS Take 3 mg by mouth at bedtime.   Yes [provider]  NYSTATIN powder Apply 1 g daily as needed topically. To affected areas As directed 03/08/17  Yes [provider]  simvastatin (ZOCOR) 20 MG tablet Take 20 mg by mouth every evening.   Yes [provider]  Skin Protectants, Misc. (EUCERIN) cream Apply 1 application daily as needed topically for dry skin. For feet    Yes [provider]  trimethoprim (TRIMPEX) 100 MG tablet Take 100 mg daily by mouth. 03/16/17  Yes [provider]   Current Facility-Administered Medications  Medication Dose Route Frequency Provider Last Rate Last Dose  . acetaminophen (TYLENOL) tablet 650 mg  650 mg Oral Q6H PRN Manuella Ghazi, Pratik D, DO       Or  . acetaminophen (TYLENOL) suppository 650 mg  650 mg Rectal Q6H PRN Manuella Ghazi, Pratik D, DO      . baclofen (LIORESAL) tablet 10 mg  10 mg Oral QHS PRN Manuella Ghazi, Pratik D, DO      . bisacodyl (DULCOLAX) suppository 10 mg  10 mg Rectal PRN Manuella Ghazi, Pratik D, DO      .  carvedilol (COREG) tablet 3.125 mg  3.125 mg Oral BID WC Shah, Pratik D, DO      . eucerin cream 1 application  1 application Topical Daily Shah, Pratik D, DO      . HYDROcodone-acetaminophen (NORCO/VICODIN) 5-325 MG per tablet 1-2 tablet  1-2 tablet Oral Q4H PRN Manuella Ghazi, Pratik D, DO      . insulin aspart (novoLOG) injection 0-9 Units  0-9 Units Subcutaneous TID WC Shah, Pratik D, DO      . insulin detemir (LEVEMIR) injection 9 Units  9 Units Subcutaneous Q breakfast Manuella Ghazi, Pratik D, DO      . lidocaine-prilocaine (EMLA) cream 1 application  1 application Topical Once Manuella Ghazi, Pratik D, DO      .  Melatonin TABS 3 mg  3 mg Oral QHS Shah, Pratik D, DO      . ondansetron (ZOFRAN) tablet 4 mg  4 mg Oral Q6H PRN Manuella Ghazi, Pratik D, DO       Or  . ondansetron (ZOFRAN) injection 4 mg  4 mg Intravenous Q6H PRN Manuella Ghazi, Pratik D, DO      . Pen Needles MISC 1 each  1 each Subcutaneous QID Manuella Ghazi, Pratik D, DO      . simvastatin (ZOCOR) tablet 20 mg  20 mg Oral QPM Shah, Pratik D, DO      . vancomycin (VANCOCIN) IVPB 750 mg/150 ml premix  750 mg Intravenous Q T,Th,Sa-HD Duffy Bruce, MD       Current Outpatient Medications  Medication Sig Dispense Refill  . baclofen (LIORESAL) 10 MG tablet Take 1 tablet (10 mg total) by mouth at bedtime as needed for muscle spasms. 30 each 0  . carvedilol (COREG) 3.125 MG tablet Take 1 tablet (3.125 mg total) by mouth 2 (two) times daily with a meal. 60 tablet 0  . insulin aspart (NOVOLOG FLEXPEN) 100 UNIT/ML FlexPen Inject 0-9 Units into the skin 3 (three) times daily with meals. Per sliding scale (Patient taking differently: Inject 7 Units 3 (three) times daily with meals into the skin. Per sliding scale) 15 mL 0  . insulin detemir (LEVEMIR) 100 UNIT/ML injection Inject 9 Units into the skin daily with breakfast.    . Insulin Pen Needle (PEN NEEDLES) 30G X 8 MM MISC Inject 1 each into the skin 4 (four) times daily. . 100 each 0  . lidocaine-prilocaine (EMLA) cream Apply 1  application topically as needed. 30 g 1  . Melatonin 3 MG TABS Take 3 mg by mouth at bedtime.    . NYSTATIN powder Apply 1 g daily as needed topically. To affected areas As directed    . simvastatin (ZOCOR) 20 MG tablet Take 20 mg by mouth every evening.    . Skin Protectants, Misc. (EUCERIN) cream Apply 1 application daily as needed topically for dry skin. For feet     . trimethoprim (TRIMPEX) 100 MG tablet Take 100 mg daily by mouth.     Labs: Basic Metabolic Panel: Recent Labs  Lab 03/18/17 0620 03/18/17 1046  NA 137 134*  K 4.2 3.7  CL 99* 101  CO2 26 22  GLUCOSE 128* 167*  BUN 58* 56*  CREATININE 3.69* 3.74*  CALCIUM 8.7* 8.0*   Liver Function Tests: Recent Labs  Lab 03/18/17 0620 03/18/17 1046  AST 15 12*  ALT 9* 7*  ALKPHOS 85 65  BILITOT 1.2 1.1  PROT 7.7 6.5  ALBUMIN 2.7* 2.4*   No results for input(s): LIPASE, AMYLASE in the last 168 hours. No results for input(s): AMMONIA in the last 168 hours. CBC: Recent Labs  Lab 03/18/17 0620 03/18/17 1046  WBC 5.8 11.8*  NEUTROABS 5.2 10.3*  HGB 10.7* 9.3*  HCT 34.2* 29.3*  MCV 98.3 98.0  PLT 137* 143*   Cardiac Enzymes: No results for input(s): CKTOTAL, CKMB, CKMBINDEX, TROPONINI in the last 168 hours. CBG: No results for input(s): GLUCAP in the last 168 hours. Iron Studies: No results for input(s): IRON, TIBC, TRANSFERRIN, FERRITIN in the last 72 hours. Studies/Results: Dg Chest Portable 1 View  Result Date: 03/18/2017 CLINICAL DATA:  Acute onset of cough and shortness of breath. Dry heaves. EXAM: PORTABLE CHEST 1 VIEW COMPARISON:  Chest radiograph performed 05/10/2016 FINDINGS: The lungs are well-aerated. There is incomplete visualization  of the left costophrenic angle. There is no evidence of focal opacification, pleural effusion or pneumothorax. The cardiomediastinal silhouette is within normal limits. No acute osseous abnormalities are seen. IMPRESSION: No acute cardiopulmonary process seen.  Electronically Signed   By: Garald Balding M.D.   On: 03/18/2017 06:50    ROS: As per HPI otherwise negative.   Physical Exam: Vitals:   03/18/17 0830 03/18/17 0915 03/18/17 0945 03/18/17 1015  BP: (!) 118/51 (!) 111/45 (!) 106/44 (!) 114/49  Pulse: (!) 110 100 95 (!) 102  Resp: 20 (!) 24 (!) 23 18  Temp:      TempSrc:      SpO2: 94% 93% 94% 95%  Weight:         General: Chronically ill appearing male in NAD.Marland Kitchen Head: Normocephalic, atraumatic, sclera non-icteric, mucus membranes are moist Neck: Supple. JVD not elevated. Lungs: Clear bilaterally to auscultation without wheezes, rales, or rhonchi. Breathing is unlabored. Heart: RRR with S1 S2. No murmurs, rubs, or gallops appreciated. SR/ST on monitor.  Abdomen: Soft, non-tender, non-distended with normoactive bowel sounds. No rebound/guarding. No obvious abdominal masses. Foley patent with cloudy, purulent urine.  Lower extremities: Heel protectors BLE No LE edema.  Skin: No rashes, open wounds. Has wound vac intact to sacral area.  Neuro: Alert and oriented X 3. Moves all extremities spontaneously. Psych:  Responds to questions appropriately with a normal affect. Dialysis Access: RUA AVF + bruit  Dialysis Orders: [place] T,Th,S 3.5 hr 180 NRe 400/800 70.5 kg 2.0 K/ 2.0 Ca UFP 2 -No heparin -Mircera 150 mcg IV q 2 weeks (last dose 03/04/17 Last HGB 10.3 03/11/17) -Venofer 50 mg IV weekly (last dose 03/11/17 Last Fe 47 Tsat 32 Ferritin 1049 02/25/17)  BMD meds: Not on Binders/Sensipar  Assessment/Plan: 1.  Sepsis: Thought to be 2/2 UTI. Cloudy purulent urine in foley. MeropenemVanc per primary. BC/urine Cx pending.  2.  ESRD -  T,Th,S. HD today on schedule. K+ 3.7 Use 4.0 K bath. No heparin.  3.  Hypertension/volume  - BP controlled. On low dose coreg 3.125 mg PO BID. No evidence of volume overload by exam or CXR. IVF stopped. Minimal UF today on HD.  4.  Anemia  - HGB 9.3. ESA due today-will give on HD today. Hold Fe until Cx  results available.  5.  Metabolic bone disease -  No binders, VDRA. Ca 8.0 Add renal function panel to labs.  6.  Nutrition - Albumin 2.4 Renal/Carb mod diet, prostat, renal vit 7. DM: per primary.  8. H/O Osteomyelitis/R ischial pressure ulcer: Wound vac intact. Per primary.    Rita H. Owens Shark, NP-C 03/18/2017, 12:47 PM  [place] [phone]  Pt seen, examined and agree w A/P as above. ESRD pt with chills and cloudy urine, admitted for UTI , IV abx.  Plan HD today as tol.  [name] MD [employer] pager [phone]   03/18/2017, 2:49 PM

## 2017-03-18 NOTE — Progress Notes (Signed)
Pharmacy Antibiotic Note  Lance [name] is a 76 y.o. male admitted on 03/18/2017 with suspected UTI. Pharmacy has been consulted for cefepime dosing.  Tmax 103 and LA 1.61. Patient is ESRD on HD Tues/Th/Sat, last on Tuesday. Also noted patient has chronic indwelling foley 2/2 MS-associated neurogenic bladder.   Plan: Cefepime 1g IV q24hr  Monitor HD schedule, clinical picture, and culture data F/u length of therapy   Weight: 171 lb (77.6 kg)  Temp (24hrs), Avg:103 F (39.4 C), Min:103 F (39.4 C), Max:103 F (39.4 C)  Recent Labs  Lab 03/18/17 0631  LATICACIDVEN 1.61    CrCl cannot be calculated (Patient's most recent lab result is older than the maximum 21 days allowed.).    No Known Allergies  Antimicrobials this admission: 11/15 cefepime >>   Microbiology results: pending   Lavonda Jumbo, PharmD Clinical Pharmacist 03/18/17 6:53 AM

## 2017-03-18 NOTE — ED Notes (Signed)
RN called lab to follow up on urine sample not resulted that was collected at 0630 this morning.

## 2017-03-18 NOTE — ED Provider Notes (Signed)
[name] EMERGENCY DEPARTMENT Provider Note   CSN: [id number] Arrival date & time: 03/18/17  0603     History   Chief Complaint Chief Complaint  Patient presents with  . Fever  . Nausea    HPI Lance [name] is a 76 y.o. male.  HPI 76 year old Caucasian male past medical history significant for CHF, end-stage renal disease currently on hemodialysis, decubitus ulcer with wound VAC, chronic spastic paraplegia secondary to MS and neurogenic bladder with chronic indwelling Foley catheter presents to the emergency department today with complaints of generalized weakness, fever, nausea.  Patient states that he was dialyzed on Tuesday.  States that after dialysis he had a dry cough and some nausea.  Patient states that he was remained nauseous over the past 2 days.  States that this morning he woke up feeling feverish with chills.  Patient states that he just does not feel well.  States that he did have his urine checked yesterday by his home health nurse did not show any signs of infection at that time.  Patient states that his Foley was replaced.  Patient denies any associated abdominal pain, change in bowel habits, productive cough, emesis, headache, vision changes, lightheadedness, dizziness, chest pain, shortness of breath.  Patient has not taking for his fever prior to arrival.  Patient is bedbound due to [place].  Patient supposed to be dialyzed today.  Patient was admitted to the hospital on 11/15 of this year for sepsis secondary to left lower lobe pneumonia and Klebsiella and MRSA UTI with urinary catheter.  Klebsiella was speciated as ESBL.  It was only sensitive to meropenem.  Patient's MRSA was susceptible to vanc.  Pt denies any ha, vision changes, lightheadedness, dizziness, congestion, neck pain, cp, sob, cough, abd pain, change in bowel habits, melena, hematochezia, lower extremity paresthesias.    Past Medical History:  Diagnosis Date  . Acute CHF  (congestive heart failure) ([place]) 02/14/2016  . Acute osteomyelitis, pelvis, right ([place])   . Acute renal failure ([place])   . Anemia in chronic renal disease   . Blood transfusion   . Cardiomyopathy ([place]) 02/17/2016  . Chronic spastic paraplegia    secondary to MS  . Decreased sensation of lower extremity    due to MS  . Decubitus ulcer of ischium   . Decubitus ulcer of trochanter, stage 4 ([place]) 06/14/2015  . ESRD (end stage renal disease) ([place])    [place]  . History of kidney stones    x2  . History of MRSA infection    urine  . History of recurrent UTIs   . Hypertension   . MS (multiple sclerosis) ([place])   . Neuralgia neuritis, sciatic nerve    MS for 30 years  . Neurogenic bladder   . PONV (postoperative nausea and vomiting)   . Presence of indwelling urinary catheter   . Self-catheterizes urinary bladder   . Type 2 diabetes mellitus [facility])     Patient Active Problem List   Diagnosis Date Noted  . Partial nontraumatic amputation of foot, left ([place]) 01/27/2017  . Malnutrition of moderate degree 07/03/2016  . Symptomatic anemia 07/02/2016  . SIRS (systemic inflammatory response syndrome) ([place]) 05/09/2016  . Melena 05/09/2016  . Hematemesis 05/09/2016  . Non-intractable vomiting with nausea 05/07/2016  . Altered mental status 04/21/2016  . Acute respiratory failure ([place])   . ESRD on dialysis ([place])   . NICM (nonischemic cardiomyopathy) ([place])   . Impaired functional mobility, balance, and  endurance 02/17/2016  . Generalized anxiety disorder 02/17/2016  . Cardiomyopathy ([place]) 02/17/2016  . Hypoxia   . Acute pulmonary edema ([facility])   . Elevated troponin 02/14/2016  . Severe protein-calorie malnutrition ([place]) 02/14/2016  . Acute systolic heart failure ([place]) 02/14/2016  . Depression 02/13/2016  . DM (diabetes mellitus) type II controlled with renal manifestation ([place]) 09/06/2015  . Dyslipidemia associated with type 2 diabetes mellitus ([place]) 09/06/2015  .  Recurrent UTI (urinary tract infection) 09/06/2015  . Decubitus ulcer of right ischial area 06/14/2015  . Decubitus ulcer of trochanter, stage 4 ([place]) 06/14/2015  . Left perineal ischial pressure ulcer   . Acute osteomyelitis, pelvis, right ([place])   . Essential hypertension, benign 08/03/2013  . Spastic quadriplegia ([place]) 07/10/2011  . Multiple sclerosis ([place]) 03/30/2011  . Sacral decubitus ulcer 03/30/2011  . Progressive anemia 03/30/2011    Past Surgical History:  Procedure Laterality Date  . AMPUTATION Left 01/22/2017   Procedure: Left 5th Ray Amputation;  Surgeon: Newt Minion, MD;  Location: [facility];  Service: Orthopedics;  Laterality: Left;  . APPLICATION OF A-CELL OF EXTREMITY Left 09/05/2014   Procedure: PLACEMENT OF ACELL AND VAC;  Surgeon: Theodoro Kos, DO;  Location: [facility];  Service: Plastics;  Laterality: Left;  . BASCILIC VEIN TRANSPOSITION Right 03/24/2016   Procedure: RADIAL- CEPHALIC FISTULA CREATION RIGHT ARM;  Surgeon: Conrad [place] Park, MD;  Location: [facility];  Service: Vascular;  Laterality: Right;  . Buttocks flap Left   . COLONOSCOPY    . EXTRACORPOREAL SHOCK WAVE LITHOTRIPSY  03/23/2011   x2  . FINGER SURGERY  2004  . hip flap Right   . INCISION AND DRAINAGE OF WOUND Left 09/05/2014   Procedure: IRRIGATION AND DEBRIDEMENT OF LEFT ISCHIUM WOUND WITH ;  Surgeon: Theodoro Kos, DO;  Location: [facility];  Service: Plastics;  Laterality: Left;  . INSERTION OF DIALYSIS CATHETER N/A 02/27/2016   Procedure: INSERTION OF DIALYSIS CATHETER-RIGHT INTERNAL JUGULA PLACEMENT;  Surgeon: Conrad Roane, MD;  Location: [facility];  Service: Vascular;  Laterality: N/A;  . TONSILLECTOMY    . WOUND DEBRIDEMENT     [facility]/2/17[facility]        Home Medications    Prior to Admission medications   Medication Sig Start Date End Date Taking? Authorizing Provider  baclofen (LIORESAL) 10 MG tablet Take 1 tablet (10 mg total) by mouth at bedtime as needed for muscle spasms. 07/15/16   Lauree Chandler, NP    bisacodyl (DULCOLAX) 10 MG suppository Place 10 mg rectally as needed for moderate constipation.    [provider]  carvedilol (COREG) 3.125 MG tablet Take 1 tablet (3.125 mg total) by mouth 2 (two) times daily with a meal. 07/15/16   Eubanks, Carlos American, NP  insulin aspart (NOVOLOG FLEXPEN) 100 UNIT/ML FlexPen Inject 0-9 Units into the skin 3 (three) times daily with meals. Per sliding scale Patient taking differently: Inject 6 Units into the skin 3 (three) times daily with meals. Per sliding scale  07/20/16   Lauree Chandler, NP  insulin detemir (LEVEMIR) 100 UNIT/ML injection Inject 9 Units into the skin daily with breakfast.    [provider]  Insulin Pen Needle (PEN NEEDLES) 30G X 8 MM MISC Inject 1 each into the skin 4 (four) times daily. . 07/20/16   Lauree Chandler, NP  lidocaine-prilocaine (EMLA) cream Apply 1 application topically as needed. 07/24/16   Conrad Lewis Run, MD  Melatonin 3 MG TABS Take 3 mg by mouth at bedtime.  [provider]  simvastatin (ZOCOR) 20 MG tablet Take 20 mg by mouth every evening.    [provider]  Skin Protectants, Misc. (EUCERIN) cream Apply 1 application topically daily. For feet    [provider]    Family History Family History  Problem Relation Age of Onset  . Stroke Father   . Diabetes Father   . Diabetes Paternal Grandmother   . Stroke Paternal Grandfather   . Heart disease Neg Hx   . Cancer Neg Hx     Social History Social History   Tobacco Use  . Smoking status: Former Smoker    Types: Cigars    Last attempt to quit: 05/06/1983    Years since quitting: 33.8  . Smokeless tobacco: Never Used  Substance Use Topics  . Alcohol use: No    Alcohol/week: 0.6 oz    Types: 1 Glasses of wine per week    Comment: pt has not had any since Dec 2017  . Drug use: No     Allergies   Patient has no known allergies.   Review of Systems Review of Systems  Constitutional: Positive for chills,  fatigue and fever.  HENT: Negative for congestion and sore throat.   Eyes: Negative for visual disturbance.  Respiratory: Negative for cough and shortness of breath.   Cardiovascular: Negative for chest pain.  Gastrointestinal: Positive for nausea. Negative for abdominal pain, diarrhea and vomiting.  Genitourinary: Negative for dysuria, flank pain, frequency, hematuria, scrotal swelling, testicular pain and urgency.  Musculoskeletal: Negative for arthralgias and myalgias.  Skin: Negative for rash.  Neurological: Positive for weakness. Negative for dizziness, syncope, light-headedness, numbness and headaches.  Psychiatric/Behavioral: Negative for sleep disturbance. The patient is not nervous/anxious.      Physical Exam Updated Vital Signs BP (!) 154/109 (BP Location: Left Arm)   Pulse (!) 124   Temp (!) 103 F (39.4 C) (Oral)   Resp (!) 25   Wt 77.6 kg (171 lb)   SpO2 97%   BMI 20.81 kg/m   Physical Exam  Constitutional: He is oriented to person, place, and time. He appears well-developed and well-nourished.  Non-toxic appearance. No distress.  HENT:  Head: Normocephalic and atraumatic.  Mucous membranes dry.  Eyes: Conjunctivae are normal. Pupils are equal, round, and reactive to light. Right eye exhibits no discharge. Left eye exhibits no discharge.  Neck: Normal range of motion. Neck supple.  Cardiovascular: Normal rate, regular rhythm, normal heart sounds and intact distal pulses. Exam reveals no gallop and no friction rub.  No murmur heard. Pulmonary/Chest: Effort normal and breath sounds normal. No stridor. No respiratory distress. He has no wheezes. He has no rhonchi. He has no rales. He exhibits no tenderness.  Diminished breath sounds throughout  Abdominal: Soft. Bowel sounds are normal. He exhibits no distension. There is no tenderness. There is no rigidity, no rebound, no guarding, no CVA tenderness, no tenderness at McBurney's point and negative Murphy's sign.    Genitourinary:  Genitourinary Comments: Indwelling Foley noted with pyuria noted in the urine bag.  Musculoskeletal: Normal range of motion. He exhibits no tenderness.  Wound vac to the patient's sacral decubitus ulcer with normal output.      Lymphadenopathy:    He has no cervical adenopathy.  Neurological: He is alert and oriented to person, place, and time.  Normal speech and language. No gross focal neurologic deficits are appreciated.   Skin: Skin is warm and dry. Capillary refill takes 2 to 3 seconds.  No rash noted. There is pallor.  Psychiatric: His behavior is normal. Judgment and thought content normal.  Nursing note and vitals reviewed.    ED Treatments / Results  Labs (all labs ordered are listed, but only abnormal results are displayed) Labs Reviewed  CULTURE, BLOOD (ROUTINE X 2)  CULTURE, BLOOD (ROUTINE X 2)  COMPREHENSIVE METABOLIC PANEL  CBC WITH DIFFERENTIAL/PLATELET  PROTIME-INR  URINALYSIS, ROUTINE W REFLEX MICROSCOPIC  I-STAT CG4 LACTIC ACID, ED  I-STAT CG4 LACTIC ACID, ED    EKG  EKG Interpretation None       Radiology No results found.  Procedures Procedures (including critical care time)  Medications Ordered in ED Medications  acetaminophen (TYLENOL) tablet 1,000 mg (not administered)  sodium chloride 0.9 % bolus 500 mL (not administered)     Initial Impression / Assessment and Plan / ED Course  I have reviewed the triage vital signs and the nursing notes.  Pertinent labs & imaging results that were available during my care of the patient were reviewed by me and considered in my medical decision making (see chart for details).     Patient presents to the ED for evaluation of fever, chills, nausea.  Patient is a hemodialysis patient has been to be dialyzed today.  Patient is bedbound secondary to [place] with indwelling Foley and decubitus ulcer with wound VAC in place.  On initial evaluation patient is febrile of 103 and tachycardic at  130.  Patient is tachypneic.  Blood pressures are elevated.  Patient appears volume depleted on exam.  Mucous membranes are dry.  Lungs are clear to auscultation bilaterally.  He does have frank pyuria noted in his indwelling Foley bag with associated hematuria.  Patient abdomen is nontender to palpation.  Given patient's tachycardia, fever and tachypnea code sepsis was initiated.  However no signs of septic shock given elevated blood pressures.  Patient's lactic acid is normal.  No leukocytosis is noted.  Do not feel that the 30 cc/kg fluid bolus is indicated at this time.  Have given patient a liter of fluid.  Patient history of congestive heart failure.  Without any signs of pulmonary edema on x-ray.  Tylenol given for fever.  No leukocytosis noted on labs.  Patient's creatinine appears at baseline.  Electrolytes are within normal limits.  Patient appears mildly hemoconcentrated.  Blood cultures were obtained prior to antibiotic initiation.  flu swab is pending.  UA and urine culture are pending.  EKG with signs of demand ischemia but no signs of ST elevation.  I did review patient's old chart.  It appears the patient was admitted before for sepsis due to pneumonia and UTI.  At that time he grew out MRSA and Klebsiella that was ESBL.  Resistant to everything except for meropenem.  Patient will be started on meropenem and vancomycin.  Do not have UA at this time however given the frank pyuria noted in the Foley bag feel that this is patient's source.  I did speak with hospital medicine Dr. Manuella Ghazi for admission.  He agrees for admission will see the patient in the ED and place admission orders.  Patient remains hemodynamic stable at this time.  Patient seen and evaluated by attending Dr. Ellender Hose who is agreeable the above plan. Final Clinical Impressions(s) / ED Diagnoses   Final diagnoses:  Sepsis due to urinary tract infection ([place])  Non-intractable vomiting with nausea, unspecified vomiting  type    ED Discharge Orders    None  Doristine Devoid, PA-C 03/18/17 [id number]    Duffy Bruce, MD 03/19/17 [phone]

## 2017-03-18 NOTE — H&P (Addendum)
History and Physical    Lance [name]:[id number] [name]: 1940-10-07 DOA: 03/18/2017  PCP: Lavone Orn, MD   Patient coming from: Home   Chief Complaint: Fevers and chills  HPI: Lance [name] is a 76 y.o. male with medical history significant of ESRD on hemodialysis Tuesday, Thursday, Saturday, recurrent UTIs with indwelling Foley catheter, and multiple sclerosis who presents to the emergency department after noticing fevers and chills that began yesterday. This started shortly after his Foley catheter was exchanged. He states that he also began having some fatigue and nausea that began after his last dialysis session 2 days ago. He has been able to maintain a good oral intake and denies any emesis. He denies any specific aggravating or alleviating factors. He has noticed drainage of cloudy urine that resembles pus in his Foley bag.  ED Course: The patient was noted to be quite febrile with a temperature of 103 Fahrenheit and was given some Tylenol in the ED. Blood cultures and urine cultures have been obtained and the patient has been started on vancomycin and Merrem on account of prior ESBL and MRSA infections with prior hospitalization. He was also given a total 1 L of normal saline. He does not have an elevated white count and lactic acid level at this time. His vital signs are otherwise stable. He is due for further dialysis today per his schedule. CXR with no acute findings.  Review of Systems: As per HPI otherwise 10 point review of systems negative.   Past Medical History:  Diagnosis Date  . Acute CHF (congestive heart failure) ([place]) 02/14/2016  . Acute osteomyelitis, pelvis, right ([place])   . Acute renal failure ([place])   . Anemia in chronic renal disease   . Blood transfusion   . Cardiomyopathy ([place]) 02/17/2016  . Chronic spastic paraplegia    secondary to MS  . Decreased sensation of lower extremity    due to MS  . Decubitus ulcer of ischium   . Decubitus ulcer of  trochanter, stage 4 ([place]) 06/14/2015  . ESRD (end stage renal disease) ([place])    [place]  . History of kidney stones    x2  . History of MRSA infection    urine  . History of recurrent UTIs   . Hypertension   . MS (multiple sclerosis) ([place])   . Neuralgia neuritis, sciatic nerve    MS for 30 years  . Neurogenic bladder   . PONV (postoperative nausea and vomiting)   . Presence of indwelling urinary catheter   . Self-catheterizes urinary bladder   . Type 2 diabetes mellitus ([place])     Past Surgical History:  Procedure Laterality Date  . AMPUTATION Left 01/22/2017   Procedure: Left 5th Ray Amputation;  Surgeon: Newt Minion, MD;  Location: [facility];  Service: Orthopedics;  Laterality: Left;  . APPLICATION OF A-CELL OF EXTREMITY Left 09/05/2014   Procedure: PLACEMENT OF ACELL AND VAC;  Surgeon: Theodoro Kos, DO;  Location: [facility];  Service: Plastics;  Laterality: Left;  . BASCILIC VEIN TRANSPOSITION Right 03/24/2016   Procedure: RADIAL- CEPHALIC FISTULA CREATION RIGHT ARM;  Surgeon: Conrad Uniopolis, MD;  Location: [facility];  Service: Vascular;  Laterality: Right;  . Buttocks flap Left   . COLONOSCOPY    . EXTRACORPOREAL SHOCK WAVE LITHOTRIPSY  03/23/2011   x2  . FINGER SURGERY  2004  . hip flap Right   . INCISION AND DRAINAGE OF WOUND Left 09/05/2014   Procedure: IRRIGATION AND  DEBRIDEMENT OF LEFT ISCHIUM WOUND WITH ;  Surgeon: Theodoro Kos, DO;  Location: [facility];  Service: Plastics;  Laterality: Left;  . INSERTION OF DIALYSIS CATHETER N/A 02/27/2016   Procedure: INSERTION OF DIALYSIS CATHETER-RIGHT INTERNAL JUGULA PLACEMENT;  Surgeon: Conrad Dennis Port, MD;  Location: [facility];  Service: Vascular;  Laterality: N/A;  . TONSILLECTOMY    . WOUND DEBRIDEMENT     10/2/17[facility]      reports that he quit smoking about 33 years ago. His smoking use included cigars. he has never used smokeless tobacco. He reports that he does not drink alcohol or use drugs.  No Known Allergies  Family  History  Problem Relation Age of Onset  . Stroke Father   . Diabetes Father   . Diabetes Paternal Grandmother   . Stroke Paternal Grandfather   . Heart disease Neg Hx   . Cancer Neg Hx     Prior to Admission medications   Medication Sig Start Date End Date Taking? Authorizing Provider  baclofen (LIORESAL) 10 MG tablet Take 1 tablet (10 mg total) by mouth at bedtime as needed for muscle spasms. 07/15/16   Lauree Chandler, NP  bisacodyl (DULCOLAX) 10 MG suppository Place 10 mg rectally as needed for moderate constipation.    [provider]  carvedilol (COREG) 3.125 MG tablet Take 1 tablet (3.125 mg total) by mouth 2 (two) times daily with a meal. 07/15/16   Eubanks, Carlos American, NP  insulin aspart (NOVOLOG FLEXPEN) 100 UNIT/ML FlexPen Inject 0-9 Units into the skin 3 (three) times daily with meals. Per sliding scale Patient taking differently: Inject 6 Units into the skin 3 (three) times daily with meals. Per sliding scale  07/20/16   Lauree Chandler, NP  insulin detemir (LEVEMIR) 100 UNIT/ML injection Inject 9 Units into the skin daily with breakfast.    [provider]  Insulin Pen Needle (PEN NEEDLES) 30G X 8 MM MISC Inject 1 each into the skin 4 (four) times daily. . 07/20/16   Lauree Chandler, NP  lidocaine-prilocaine (EMLA) cream Apply 1 application topically as needed. 07/24/16   Conrad Roosevelt, MD  Melatonin 3 MG TABS Take 3 mg by mouth at bedtime.    [provider]  simvastatin (ZOCOR) 20 MG tablet Take 20 mg by mouth every evening.    [provider]  Skin Protectants, Misc. (EUCERIN) cream Apply 1 application topically daily. For feet    [provider]    Physical Exam: Vitals:   03/18/17 0612 03/18/17 0613 03/18/17 0615  BP:  (!) 154/109 (!) 154/77  Pulse:  (!) 124 (!) 131  Resp:  (!) 25 20  Temp:  (!) 103 F (39.4 C)   TempSrc:  Oral   SpO2:  97% 92%  Weight: 77.6 kg (171 lb)      Constitutional: NAD, calm,  comfortable Vitals:   03/18/17 0612 03/18/17 0613 03/18/17 0615  BP:  (!) 154/109 (!) 154/77  Pulse:  (!) 124 (!) 131  Resp:  (!) 25 20  Temp:  (!) 103 F (39.4 C)   TempSrc:  Oral   SpO2:  97% 92%  Weight: 77.6 kg (171 lb)     Eyes: PERRL, lids and conjunctivae normal ENMT: Mucous membranes are moist. Posterior pharynx clear of any exudate or lesions.Normal dentition.  Neck: normal, supple, no masses, no thyromegaly Respiratory: clear to auscultation bilaterally, no wheezing, no crackles. Normal respiratory effort. No accessory muscle use.  Cardiovascular: Regular rate and rhythm,  no murmurs / rubs / gallops. No extremity edema. 2+ pedal pulses. No carotid bruits.  Abdomen: no tenderness, no masses palpated. No hepatosplenomegaly. Bowel sounds positive.  Musculoskeletal: no clubbing / cyanosis. No joint deformity upper and lower extremities. Good ROM, no contractures. Normal muscle tone.  Skin: no rashes, lesions, ulcers. No induration; foley with sanguionous output; no drainage from urethra Neurologic: CN 2-12 grossly intact. Sensation intact, DTR normal. Strength 5/5 in all 4.  Psychiatric: Normal judgment and insight. Alert and oriented x 3. Normal mood.    Labs on Admission: I have personally reviewed following labs and imaging studies  CBC: Recent Labs  Lab 03/18/17 0620  WBC 5.8  NEUTROABS 5.2  HGB 10.7*  HCT 34.2*  MCV 98.3  PLT 509*   Basic Metabolic Panel: Recent Labs  Lab 03/18/17 0620  NA 137  K 4.2  CL 99*  CO2 26  GLUCOSE 128*  BUN 58*  CREATININE 3.69*  CALCIUM 8.7*   GFR: Estimated Creatinine Clearance: 19 mL/min (A) (by C-G formula based on SCr of 3.69 mg/dL (H)). Liver Function Tests: Recent Labs  Lab 03/18/17 0620  AST 15  ALT 9*  ALKPHOS 85  BILITOT 1.2  PROT 7.7  ALBUMIN 2.7*   No results for input(s): LIPASE, AMYLASE in the last 168 hours. No results for input(s): AMMONIA in the last 168 hours. Coagulation Profile: Recent Labs   Lab 03/18/17 0620  INR 1.11   Cardiac Enzymes: No results for input(s): CKTOTAL, CKMB, CKMBINDEX, TROPONINI in the last 168 hours. BNP (last 3 results) No results for input(s): PROBNP in the last 8760 hours. HbA1C: No results for input(s): HGBA1C in the last 72 hours. CBG: No results for input(s): GLUCAP in the last 168 hours. Lipid Profile: No results for input(s): CHOL, HDL, LDLCALC, TRIG, CHOLHDL, LDLDIRECT in the last 72 hours. Thyroid Function Tests: No results for input(s): TSH, T4TOTAL, FREET4, T3FREE, THYROIDAB in the last 72 hours. Anemia Panel: No results for input(s): VITAMINB12, FOLATE, FERRITIN, TIBC, IRON, RETICCTPCT in the last 72 hours. Urine analysis:    Component Value Date/Time   COLORURINE RED (A) 07/02/2016 2127   APPEARANCEUR CLOUDY (A) 07/02/2016 2127   LABSPEC 1.010 07/02/2016 2127   PHURINE >9.0 (H) 07/02/2016 2127   GLUCOSEU 100 (A) 07/02/2016 2127   HGBUR LARGE (A) 07/02/2016 2127   BILIRUBINUR SMALL (A) 07/02/2016 2127   KETONESUR NEGATIVE 07/02/2016 2127   PROTEINUR 100 (A) 07/02/2016 2127   UROBILINOGEN 0.2 08/30/2014 2203   NITRITE POSITIVE (A) 07/02/2016 2127   LEUKOCYTESUR LARGE (A) 07/02/2016 2127    Radiological Exams on Admission: Dg Chest Portable 1 View  Result Date: 03/18/2017 CLINICAL DATA:  Acute onset of cough and shortness of breath. Dry heaves. EXAM: PORTABLE CHEST 1 VIEW COMPARISON:  Chest radiograph performed 05/10/2016 FINDINGS: The lungs are well-aerated. There is incomplete visualization of the left costophrenic angle. There is no evidence of focal opacification, pleural effusion or pneumothorax. The cardiomediastinal silhouette is within normal limits. No acute osseous abnormalities are seen. IMPRESSION: No acute cardiopulmonary process seen. Electronically Signed   By: Garald Balding M.D.   On: 03/18/2017 06:50    Assessment/Plan Principal Problem:   Sepsis secondary to UTI [facility]) Active Problems:   Multiple sclerosis  ([place])   Sacral decubitus ulcer   Severe protein-calorie malnutrition ([place])   ESRD on dialysis ([place])   Sepsis ([place])   Sepsis secondary to UTI -Continue on Merrem and vancomycin -Await further cultures as already ordered -Patient does not  have hypertension or significant lactic acid elevation; therefore no need for further fluid bolus at this time -Lactic acid in AM -Hematuria will place on SCD -Tylenol prn fevers  ESRD on HD T/Th/Sat -Consult to nephrology  Sacral Decubitus ulcer -Consult to wound care  Severe protein-cal malnutrition -Renal/Carb Diet  DM II -Continue Lantus -SSI  CHF -Continue Coreg  DVT prophylaxis: SCD Code Status: DNR Family Communication: None Disposition Plan: 24-48 hours Consults called: Nephrology Dr. Jonnie Finner for HD Admission status: Inpatient/med floor   Kalina Morabito D Manuella Ghazi DO [facility] Pager [phone]  If 7PM-7AM, please contact night-coverage [email or web address] Password [facility]  03/18/2017, 8:36 AM

## 2017-03-18 NOTE — ED Notes (Signed)
ED Provider at bedside. 

## 2017-03-18 NOTE — Progress Notes (Signed)
Pharmacy Antibiotic Note  Lance [name] is a 76 y.o. male admitted on 03/18/2017 with urosepsis and h/o MDR UTIs.  Pharmacy has been consulted for Vancomycin and Meropenem dosing.  Plan: Vancomycin 1500 mg IV now, then 750 mg IV qHD Meropenem 500 mg IV q24h  Weight: 171 lb (77.6 kg)  Temp (24hrs), Avg:103 F (39.4 C), Min:103 F (39.4 C), Max:103 F (39.4 C)  Recent Labs  Lab 03/18/17 0620 03/18/17 0631  WBC 5.8  --   LATICACIDVEN  --  1.61    CrCl cannot be calculated (Patient's most recent lab result is older than the maximum 21 days allowed.).    No Known Allergies   [name] 03/18/2017 7:33 AM

## 2017-03-18 NOTE — ED Notes (Signed)
Pt cleaned after bowel movement.

## 2017-03-18 NOTE — ED Provider Notes (Signed)
Medical screening examination/treatment/procedure(s) were conducted as a shared visit with non-physician practitioner(s) and myself.  I personally evaluated the patient during the encounter. Briefly, the patient is a 76 yo M with h/o MS, ESRD on TTSa HD, chronic indwelling here with nausea, vomiting, fever to 103. Code sepsis initiated. On exam, pt mentating well, febrile and dry appearing but non-toxic. Foley draining grossly purulent urine. He has h/o MRSA and ESBL-Klebsiella based on prior UCx. Will give vanc/mero, admit for sepsis 2/2 UTI. No other apparent infectious source.   EKG Interpretation  Date/Time:  Thursday March 18 2017 06:16:14 EST Ventricular Rate:  120 PR Interval:    QRS Duration: 93 QT Interval:  307 QTC Calculation: 434 R Axis:   48 Text Interpretation:  Sinus tachycardia Borderline ST depression, anterolateral leads Since last EKG, rate has increased and there are new ST depressions laterally, possibly demand Confirmed by Duffy Bruce [phone]) on 03/18/2017 7:05:24 AM          Duffy Bruce, MD 03/18/17 [phone]

## 2017-03-18 NOTE — ED Notes (Signed)
Pt's CBG 235.  Informed Brooke, Therapist, sports.

## 2017-03-19 DIAGNOSIS — L89159 Pressure ulcer of sacral region, unspecified stage: Secondary | ICD-10-CM

## 2017-03-19 DIAGNOSIS — N390 Urinary tract infection, site not specified: Secondary | ICD-10-CM

## 2017-03-19 DIAGNOSIS — A419 Sepsis, unspecified organism: Principal | ICD-10-CM

## 2017-03-19 DIAGNOSIS — R112 Nausea with vomiting, unspecified: Secondary | ICD-10-CM

## 2017-03-19 DIAGNOSIS — G35 Multiple sclerosis: Secondary | ICD-10-CM

## 2017-03-19 DIAGNOSIS — N186 End stage renal disease: Secondary | ICD-10-CM

## 2017-03-19 DIAGNOSIS — Z992 Dependence on renal dialysis: Secondary | ICD-10-CM

## 2017-03-19 DIAGNOSIS — E440 Moderate protein-calorie malnutrition: Secondary | ICD-10-CM

## 2017-03-19 LAB — BASIC METABOLIC PANEL
Anion gap: 9 (ref 5–15)
BUN: 69 mg/dL — ABNORMAL HIGH (ref 6–20)
CALCIUM: 8.4 mg/dL — AB (ref 8.9–10.3)
CO2: 23 mmol/L (ref 22–32)
Chloride: 103 mmol/L (ref 101–111)
Creatinine, Ser: 4.32 mg/dL — ABNORMAL HIGH (ref 0.61–1.24)
GFR calc Af Amer: 14 mL/min — ABNORMAL LOW (ref >60)
GFR calc non Af Amer: 12 mL/min — ABNORMAL LOW (ref >60)
GLUCOSE: 128 mg/dL — AB (ref 65–99)
Potassium: 4.1 mmol/L (ref 3.5–5.1)
Sodium: 135 mmol/L (ref 135–145)

## 2017-03-19 LAB — CBC
HCT: 29.9 % — ABNORMAL LOW (ref 39.0–52.0)
HEMOGLOBIN: 9.2 g/dL — AB (ref 13.0–17.0)
MCH: 30 pg (ref 26.0–34.0)
MCHC: 30.8 g/dL (ref 30.0–36.0)
MCV: 97.4 fL (ref 78.0–100.0)
PLATELETS: 152 10*3/uL (ref 150–400)
RBC: 3.07 MIL/uL — ABNORMAL LOW (ref 4.22–5.81)
RDW: 18.9 % — ABNORMAL HIGH (ref 11.5–15.5)
WBC: 6.3 10*3/uL (ref 4.0–10.5)

## 2017-03-19 LAB — GLUCOSE, CAPILLARY
GLUCOSE-CAPILLARY: 156 mg/dL — AB (ref 65–99)
GLUCOSE-CAPILLARY: 129 mg/dL — AB (ref 65–99)
Glucose-Capillary: 118 mg/dL — ABNORMAL HIGH (ref 65–99)

## 2017-03-19 MED ORDER — VANCOMYCIN HCL IN DEXTROSE 750-5 MG/150ML-% IV SOLN
750.0000 mg | INTRAVENOUS | Status: DC
  Filled 2017-03-19: qty 150

## 2017-03-19 MED ORDER — INSULIN ASPART 100 UNIT/ML ~~LOC~~ SOLN: 0.0000 [IU] | Freq: Three times a day (TID) | SUBCUTANEOUS | Status: DC

## 2017-03-19 MED ORDER — VANCOMYCIN HCL IN DEXTROSE 750-5 MG/150ML-% IV SOLN
INTRAVENOUS | Status: AC
  Administered 2017-03-19: 750 mg via INTRAVENOUS
  Filled 2017-03-19: qty 150

## 2017-03-19 MED ORDER — VANCOMYCIN HCL IN DEXTROSE 750-5 MG/150ML-% IV SOLN
750.0000 mg | INTRAVENOUS | Status: DC
  Administered 2017-03-19: 750 mg via INTRAVENOUS
  Filled 2017-03-19: qty 150

## 2017-03-19 NOTE — Progress Notes (Signed)
Subjective:  Feels better , For Hd today , Missed hd yest   Objective Vital signs in last 24 hours: Vitals:   03/18/17 1545 03/18/17 1647 03/18/17 2058 03/19/17 0428  BP: (!) 108/45 (!) 101/47 (!) 106/49 (!) 112/59  Pulse: 73 71 73 78  Resp: (!) 21 20 18 18   Temp:  97.9 F (36.6 C) 97.7 F (36.5 C) 98.1 F (36.7 C)  TempSrc:  Oral Oral Oral  SpO2: 98% 100% 97% 98%  Weight:       Weight change:   Physical Exam: General: Chronically ill appearing  WM ,but alert, NAD  . OX4 appropriate. Lungs: Clear bilaterally to auscultation . Breathing is unlabored. Heart: RRR ,No mur., rub, or g appreciated.   Abdomen: normoactive bowel sounds,Soft,  non-tender, non-distended, with Foley patent with cloudy, purulent urine. Has wound vac intact to sacral area.  Lower extremitis: Heel protectors BLE No LE edema. .  Dialysis Access: RUA AVF + bruit  CXR 11/15 - clear  Dialysis Orders: [place] T,Th,S 3.5 hr 180 NRe 400/800 70.5 kg 2.0 K/ 2.0 Ca UFP 2 -No heparin -Mircera 150 mcg IV q 2 weeks (last dose 03/04/17 Last HGB 10.3 03/11/17) -Venofer 50 mg IV weekly (last dose 03/11/17 Last Fe 47 Tsat 32 Ferritin 1049 02/25/17)  BMD meds: Not on Binders/Sensipar  Problem/Plan: 1.  Sepsis= thought  Sec UTI  Grm neg rod in urine >100.000 col., BC no growth so far . Feels better on MeropenemVanc per primary. 2. ESRD = TTS HD , HD  Today off schedule sec ? incr pt load yest / K 3.7 yest  Fu labs pre hd today / Thanksgiv  Holiday schedule, next week  Monday ,wed sat will Be ,Thus no HD tomor unless labs abnormal,  today no excess vol will run even  3. Anemia - hgb 9.3 yest   ESA  Today  150 mcg Aranesp / hold fe with infxn 4. Secondary hyperparathyroidism - no vit d or binder as op  Fu ca , phos labs\ 5. HTN/volume - bp ok,low dose coreg 3.125 mg PO BID. cxr  NAD, recent edw Incr as op  By Dr. Lorrene Reid keep even  6.  DM per admit 7. H/O Osteomyelitis/R ischial pressure ulcer: Wound vac intact. Per  primary. 8. Nutrition - Albumin 2.4 Renal/Carb mod diet, prostat, renal vit  Lance Haber, PA-C [facility] [phone] 03/19/2017,1:19 PM  LOS: 1 day   Pt seen, examined and agree w A/P as above.  [name] MD [facility] pager [phone]   03/19/2017, 3:00 PM    Labs: Basic Metabolic Panel: Recent Labs  Lab 03/18/17 0620 03/18/17 1046  NA 137 134*  K 4.2 3.7  CL 99* 101  CO2 26 22  GLUCOSE 128* 167*  BUN 58* 56*  CREATININE 3.69* 3.74*  CALCIUM 8.7* 8.0*   Liver Function Tests: Recent Labs  Lab 03/18/17 0620 03/18/17 1046  AST 15 12*  ALT 9* 7*  ALKPHOS 85 65  BILITOT 1.2 1.1  PROT 7.7 6.5  ALBUMIN 2.7* 2.4*   No results for input(s): LIPASE, AMYLASE in the last 168 hours. No results for input(s): AMMONIA in the last 168 hours. CBC: Recent Labs  Lab 03/18/17 0620 03/18/17 1046  WBC 5.8 11.8*  NEUTROABS 5.2 10.3*  HGB 10.7* 9.3*  HCT 34.2* 29.3*  MCV 98.3 98.0  PLT 137* 143*   Cardiac Enzymes: No results for input(s): CKTOTAL, CKMB, CKMBINDEX, TROPONINI in the last 168 hours.  CBG: Recent Labs  Lab 03/18/17 1558 03/19/17 0759 03/19/17 1240  GLUCAP 235* 118* 156*     Medications: . meropenem (MERREM) IV Stopped (03/19/17 1037)  . vancomycin Stopped (03/18/17 2150)   . carvedilol  3.125 mg Oral BID WC  . [START ON 03/25/2017] darbepoetin (ARANESP) injection - DIALYSIS  150 mcg Intravenous Q Thu-HD  . hydrocerin  1 application Topical Daily  . insulin aspart  0-9 Units Subcutaneous TID WC  . insulin detemir  9 Units Subcutaneous Q breakfast  . Melatonin  3 mg Oral QHS  . simvastatin  20 mg Oral QPM

## 2017-03-19 NOTE — Progress Notes (Signed)
PROGRESS NOTE    Lance [name]  KGM:[id number] [name]: [birth year] DOA: 03/18/2017 PCP: Lavone Orn, MD   Brief Narrative: Lance [name] is a 76 y.o. male with a history of ESRD on hemodialysis, chronic combined systolic and diastolic heart failure, multiple sclerosis. He presented with fevers and chills with concern for UTI causing sepsis. He was started on broad spectrum vancomycin and meropenem. Blood and urine cultures obtained.    Assessment & Plan:   Principal Problem:   Sepsis secondary to UTI [facility]) Active Problems:   Multiple sclerosis ([place])   Sacral decubitus ulcer   ESRD on dialysis ([place])   Sepsis ([place])   Sepsis Secondary to UTI.  Urine culture significant for gram-negative -Continue vancomycin and meropenem -Blood cultures pending (no growth x1 day) -Urine culture sensitivities pending  Multiple sclerosis Stable. -continue Baclofen  Sacral decubitus ulcer Present on admission -wound care recommendations  Moderate protein-calorie malnutrition Secondary to chronic diseases.  ESRD on dialysis Tuesday, Thursday, Saturday. Missed dialysis yesterday while in the hospital -nephrology recommendations  Diabetes mellitus, type 2 -Continue Levemir 9 units daily -Sliding scale insulin  Chronic combined systolic and diastolic heart failure Last EF of 35-40% with grade 1 diastolic dysfunction and moderate diffuse hypokinesis -Watch fluid status   DVT prophylaxis: SCDs Code Status: DNR Family Communication: None at bedside Disposition Plan: Discharge home when medically stable   Consultants:   Nephrology  Procedures:   Dialysis (T,T,S)  Antimicrobials:  Vancomycin  Meropenem    Subjective: Patient feels much better than yesterday. Concerned about not having dialysis or his insulin.  Objective: Vitals:   03/18/17 1545 03/18/17 1647 03/18/17 2058 03/19/17 0428  BP: (!) 108/45 (!) 101/47 (!) 106/49 (!) 112/59  Pulse: 73 71 73 78    Resp: (!) 21 20 18 18   Temp:  97.9 F (36.6 C) 97.7 F (36.5 C) 98.1 F (36.7 C)  TempSrc:  Oral Oral Oral  SpO2: 98% 100% 97% 98%  Weight:        Intake/Output Summary (Last 24 hours) at 03/19/2017 1342 Last data filed at 03/19/2017 0900 Gross per 24 hour  Intake 150 ml  Output 1350 ml  Net -1200 ml   Filed Weights   03/18/17 0612  Weight: 77.6 kg (171 lb)    Examination:  General exam: Appears calm and comfortable Respiratory system: Clear to auscultation. Respiratory effort normal. Cardiovascular system: S1 & S2 heard, RRR. Gastrointestinal system: Abdomen is nondistended, soft and nontender. Normal bowel sounds heard. Central nervous system: Alert and oriented. Extremities: No edema. No calf tenderness Skin: No cyanosis. No rashes Psychiatry: Judgement and insight appear normal. Mood & affect appropriate.     Data Reviewed: I have personally reviewed following labs and imaging studies  CBC: Recent Labs  Lab 03/18/17 0620 03/18/17 1046  WBC 5.8 11.8*  NEUTROABS 5.2 10.3*  HGB 10.7* 9.3*  HCT 34.2* 29.3*  MCV 98.3 98.0  PLT 137* 644*   Basic Metabolic Panel: Recent Labs  Lab 03/18/17 0620 03/18/17 1046  NA 137 134*  K 4.2 3.7  CL 99* 101  CO2 26 22  GLUCOSE 128* 167*  BUN 58* 56*  CREATININE 3.69* 3.74*  CALCIUM 8.7* 8.0*   GFR: Estimated Creatinine Clearance: 18.7 mL/min (A) (by C-G formula based on SCr of 3.74 mg/dL (H)). Liver Function Tests: Recent Labs  Lab 03/18/17 0620 03/18/17 1046  AST 15 12*  ALT 9* 7*  ALKPHOS 85 65  BILITOT 1.2 1.1  PROT 7.7 6.5  ALBUMIN  2.7* 2.4*   No results for input(s): LIPASE, AMYLASE in the last 168 hours. No results for input(s): AMMONIA in the last 168 hours. Coagulation Profile: Recent Labs  Lab 03/18/17 0620 03/18/17 1046  INR 1.11 1.18   Cardiac Enzymes: No results for input(s): CKTOTAL, CKMB, CKMBINDEX, TROPONINI in the last 168 hours. BNP (last 3 results) No results for input(s):  PROBNP in the last 8760 hours. HbA1C: No results for input(s): HGBA1C in the last 72 hours. CBG: Recent Labs  Lab 03/18/17 1558 03/19/17 0759 03/19/17 1240  GLUCAP 235* 118* 156*   Lipid Profile: No results for input(s): CHOL, HDL, LDLCALC, TRIG, CHOLHDL, LDLDIRECT in the last 72 hours. Thyroid Function Tests: No results for input(s): TSH, T4TOTAL, FREET4, T3FREE, THYROIDAB in the last 72 hours. Anemia Panel: No results for input(s): VITAMINB12, FOLATE, FERRITIN, TIBC, IRON, RETICCTPCT in the last 72 hours. Sepsis Labs: Recent Labs  Lab 03/18/17 0631 03/18/17 1046 03/18/17 1102  PROCALCITON  --  25.93  --   LATICACIDVEN 1.61  --  0.8    Recent Results (from the past 240 hour(s))  Culture, blood (Routine x 2)     Status: None (Preliminary result)   Collection Time: 03/18/17  6:25 AM  Result Value Ref Range Status   Specimen Description BLOOD LEFT ARM  Final   Special Requests   Final    BOTTLES DRAWN AEROBIC AND ANAEROBIC Blood Culture results may not be optimal due to an excessive volume of blood received in culture bottles   Culture NO GROWTH 1 DAY  Final   Report Status PENDING  Incomplete  Culture, blood (Routine x 2)     Status: None (Preliminary result)   Collection Time: 03/18/17  6:58 AM  Result Value Ref Range Status   Specimen Description BLOOD LEFT FOREARM  Final   Special Requests IN PEDIATRIC BOTTLE Blood Culture adequate volume  Final   Culture NO GROWTH 1 DAY  Final   Report Status PENDING  Incomplete  Urine culture     Status: Abnormal (Preliminary result)   Collection Time: 03/18/17 12:31 PM  Result Value Ref Range Status   Specimen Description URINE, CATHETERIZED  Final   Special Requests NONE  Final   Culture >=100,000 COLONIES/mL GRAM NEGATIVE RODS (A)  Final   Report Status PENDING  Incomplete         Radiology Studies: Dg Chest Portable 1 View  Result Date: 03/18/2017 CLINICAL DATA:  Acute onset of cough and shortness of breath. Dry  heaves. EXAM: PORTABLE CHEST 1 VIEW COMPARISON:  Chest radiograph performed 05/10/2016 FINDINGS: The lungs are well-aerated. There is incomplete visualization of the left costophrenic angle. There is no evidence of focal opacification, pleural effusion or pneumothorax. The cardiomediastinal silhouette is within normal limits. No acute osseous abnormalities are seen. IMPRESSION: No acute cardiopulmonary process seen. Electronically Signed   By: Garald Balding M.D.   On: 03/18/2017 06:50        Scheduled Meds: . carvedilol  3.125 mg Oral BID WC  . [START ON 03/25/2017] darbepoetin (ARANESP) injection - DIALYSIS  150 mcg Intravenous Q Thu-HD  . hydrocerin  1 application Topical Daily  . insulin aspart  0-9 Units Subcutaneous TID WC  . insulin detemir  9 Units Subcutaneous Q breakfast  . Melatonin  3 mg Oral QHS  . simvastatin  20 mg Oral QPM   Continuous Infusions: . meropenem (MERREM) IV Stopped (03/19/17 1037)  . vancomycin Stopped (03/18/17 2150)     LOS: 1  day     Cordelia Poche, MD [facility] 03/19/2017, 1:42 PM Pager: [phone]  If 7PM-7AM, please contact night-coverage [email or web address] Password [email or web address] 03/19/2017, 1:42 PM

## 2017-03-19 NOTE — Consult Note (Addendum)
[facility] Nurse wound consult note Reason for Consult: Consult requested to change Vac dressing; pt has a [place] machine which was applied at home and he is followed by the outpatient wound care center.  Wound type: Chonic stage 4 pressure injury to right ischium Pressure Injury POA: Yes Measurement: 3X2X4cm Wound bed: beefy red, bone palpable with swab Drainage (amount, consistency, odor) small amt tan drainage, no odor Periwound: Intact skin surrounding Dressing procedure/placement/frequency: Removed previous gauze dressing.  Applied one piece black foam and barrier ring to maintain a seal in a crease at the lower wound edge, bridged track pad to right thigh to reduce pressure.  Air mattress ordered for bed.  Pt's machine left at the bedside with charger.  He can resume use when discharged; explained that it is hospital policy to apply the Vac while inpatient and he verbalized understanding. Please re-consult if further assistance is needed.  Thank-you,  Julien Girt MSN, [place], [place], [place], [place]

## 2017-03-20 DIAGNOSIS — E43 Unspecified severe protein-calorie malnutrition: Secondary | ICD-10-CM

## 2017-03-20 LAB — URINE CULTURE

## 2017-03-20 LAB — GLUCOSE, CAPILLARY
GLUCOSE-CAPILLARY: 155 mg/dL — AB (ref 65–99)
Glucose-Capillary: 102 mg/dL — ABNORMAL HIGH (ref 65–99)

## 2017-03-20 MED ORDER — CEPHALEXIN 500 MG PO CAPS: 500.0000 mg | ORAL_CAPSULE | Freq: Every day | ORAL | Status: DC

## 2017-03-20 MED ORDER — CEPHALEXIN 500 MG PO CAPS: 500.0000 mg | ORAL_CAPSULE | Freq: Every day | ORAL | 0 refills | Status: AC

## 2017-03-20 NOTE — Discharge Instructions (Signed)
Urinary Tract Infection, Adult A urinary tract infection (UTI) is an infection of any part of the urinary tract, which includes the kidneys, ureters, bladder, and urethra. These organs make, store, and get rid of urine in the body. UTI can be a bladder infection (cystitis) or kidney infection (pyelonephritis). What are the causes? This infection may be caused by fungi, viruses, or bacteria. Bacteria are the most common cause of UTIs. This condition can also be caused by repeated incomplete emptying of the bladder during urination. What increases the risk? This condition is more likely to develop if:  You ignore your need to urinate or hold urine for long periods of time.  You do not empty your bladder completely during urination.  You wipe back to front after urinating or having a bowel movement, if you are male.  You are uncircumcised, if you are male.  You are constipated.  You have a urinary catheter that stays in place (indwelling).  You have a weak defense (immune) system.  You have a medical condition that affects your bowels, kidneys, or bladder.  You have diabetes.  You take antibiotic medicines frequently or for long periods of time, and the antibiotics no longer work well against certain types of infections (antibiotic resistance).  You take medicines that irritate your urinary tract.  You are exposed to chemicals that irritate your urinary tract.  You are male. What are the signs or symptoms? Symptoms of this condition include:  Fever.  Frequent urination or passing small amounts of urine frequently.  Needing to urinate urgently.  Pain or burning with urination.  Urine that smells bad or unusual.  Cloudy urine.  Pain in the lower abdomen or back.  Trouble urinating.  Blood in the urine.  Vomiting or being less hungry than normal.  Diarrhea or abdominal pain.  Vaginal discharge, if you are male. How is this diagnosed? This condition is  diagnosed with a medical history and physical exam. You will also need to provide a urine sample to test your urine. Other tests may be done, including:  Blood tests.  Sexually transmitted disease (STD) testing. If you have had more than one UTI, a cystoscopy or imaging studies may be done to determine the cause of the infections. How is this treated? Treatment for this condition often includes a combination of two or more of the following:  Antibiotic medicine.  Other medicines to treat less common causes of UTI.  Over-the-counter medicines to treat pain.  Drinking enough water to stay hydrated. Follow these instructions at home:  Take over-the-counter and prescription medicines only as told by your health care provider.  If you were prescribed an antibiotic, take it as told by your health care provider. Do not stop taking the antibiotic even if you start to feel better.  Avoid alcohol, caffeine, tea, and carbonated beverages. They can irritate your bladder.  Drink enough fluid to keep your urine clear or pale yellow.  Keep all follow-up visits as told by your health care provider. This is important.  Make sure to:  Empty your bladder often and completely. Do not hold urine for long periods of time.  Empty your bladder before and after sex.  Wipe from front to back after a bowel movement if you are male. Use each tissue one time when you wipe. Contact a health care provider if:  You have back pain.  You have a fever.  You feel nauseous or vomit.  Your symptoms do not get better after 3   days.  Your symptoms go away and then return. Get help right away if:  You have severe back pain or lower abdominal pain.  You are vomiting and cannot keep down any medicines or water. This information is not intended to replace advice given to you by your health care provider. Make sure you discuss any questions you have with your health care provider. Document Released:  01/28/2005 Document Revised: 10/02/2015 Document Reviewed: 03/11/2015 Elsevier Interactive Patient Education  2017 Elsevier Inc.  

## 2017-03-20 NOTE — Care Management Note (Signed)
Case Management Note  Patient Details  Name: ADEL BURCH MRN: [id number] Date of Birth: 1940/11/06  Subjective/Objective: CM called for Transportation home via [facility]. Called and they will transport ASAP.                    Action/Plan:CM will sign off for now but will be available should additional discharge needs arise or disposition change.    Expected Discharge Date:  03/20/17               Expected Discharge Plan:  Home/Self Care  In-House Referral:     Discharge planning Services  CM Consult  Post Acute Care Choice:    Choice offered to:     DME Arranged:    DME Agency:     HH Arranged:    HH Agency:     Status of Service:  Completed, signed off  If discussed at [employer] of Stay Meetings, dates discussed:    Additional Comments:  Delrae Sawyers, RN 03/20/2017, 12:35 PM

## 2017-03-20 NOTE — Progress Notes (Signed)
Subjective:  Feels better , says he is going home today   Objective Vital signs in last 24 hours: Vitals:   03/19/17 1745 03/19/17 1815 03/19/17 1955 03/20/17 0500  BP: 132/60 129/62 137/63 140/77  Pulse: 68 89 82 78  Resp: 17 16 16 16   Temp:  97.8 F (36.6 C) 98 F (36.7 C) 98.6 F (37 C)  TempSrc:  Oral Oral Oral  SpO2:  98% 99% 98%  Weight:  72.6 kg (160 lb 0.9 oz)     Weight change:   Physical Exam: General: Chronically ill appearing  WM ,but alert, NAD  . OX4 appropriate. Lungs: Clear bilaterally to auscultation . Breathing is unlabored. Heart: RRR ,No mur., rub, or g appreciated.   Abdomen: normoactive bowel sounds,Soft,  non-tender, non-distended, with Foley patent with cloudy, purulent urine. Has wound vac intact to sacral area.  Lower extremitis: Heel protectors BLE No LE edema. .  Dialysis Access: RUA AVF + bruit  CXR 11/15 - clear  Dialysis Orders: [place] T,Th,S 3.5 hr 180 NRe 400/800 70.5 kg 2.0 K/ 2.0 Ca UFP 2 -No heparin -Mircera 150 mcg IV q 2 weeks (last dose 03/04/17 Last HGB 10.3 03/11/17) -Venofer 50 mg IV weekly (last dose 03/11/17 Last Fe 47 Tsat 32 Ferritin 1049 02/25/17)  BMD meds: Not on Binders/Sensipar  Problem/Plan: 1.  Sepsis= sec to UTI , UCx +klebs PNA, mostly sensitive, BC no growth so far . Feels better on IV abx.  2.  ESRD = TTS HD.  Plan is no HD today, he will get HD on Monday (here or as OP if dc'd).  3. Anemia of CKD - hgb 9.3 yest   ESA  Today  150 mcg Aranesp / hold fe with infxn 4. Secondary hyperparathyroidism - no vit d or binder as op  Fu ca , phos labs\ 5. HTN/volume - bp ok,low dose coreg 3.125 mg PO BID. cxr  NAD, recent edw Incr as op  By Dr. Lorrene Reid keep even  6.  DM per admit 7. H/O Osteomyelitis/R ischial pressure ulcer: Wound vac intact. Per primary. 8. Nutrition - Albumin 2.4 Renal/Carb mod diet, prostat, renal vit 9. Dispo - possible dc today, per primary. Next HD Monday on holiday schedule.    [name]  MD [facility] pager [phone]   03/20/2017, 11:10 AM    Labs: Basic Metabolic Panel: Recent Labs  Lab 03/18/17 0620 03/18/17 1046 03/19/17 1521  NA 137 134* 135  K 4.2 3.7 4.1  CL 99* 101 103  CO2 26 22 23   GLUCOSE 128* 167* 128*  BUN 58* 56* 69*  CREATININE 3.69* 3.74* 4.32*  CALCIUM 8.7* 8.0* 8.4*   Liver Function Tests: Recent Labs  Lab 03/18/17 0620 03/18/17 1046  AST 15 12*  ALT 9* 7*  ALKPHOS 85 65  BILITOT 1.2 1.1  PROT 7.7 6.5  ALBUMIN 2.7* 2.4*   No results for input(s): LIPASE, AMYLASE in the last 168 hours. No results for input(s): AMMONIA in the last 168 hours. CBC: Recent Labs  Lab 03/18/17 0620 03/18/17 1046 03/19/17 1521  WBC 5.8 11.8* 6.3  NEUTROABS 5.2 10.3*  --   HGB 10.7* 9.3* 9.2*  HCT 34.2* 29.3* 29.9*  MCV 98.3 98.0 97.4  PLT 137* 143* 152   Cardiac Enzymes: No results for input(s): CKTOTAL, CKMB, CKMBINDEX, TROPONINI in the last 168 hours. CBG: Recent Labs  Lab 03/18/17 1558 03/19/17 0759 03/19/17 1240 03/19/17 2233 03/20/17 0803  GLUCAP 235* 118* 156* 129* 102*  Medications: . meropenem (MERREM) IV Stopped (03/20/17 6578)  . vancomycin     . carvedilol  3.125 mg Oral BID WC  . [START ON 03/25/2017] darbepoetin (ARANESP) injection - DIALYSIS  150 mcg Intravenous Q Thu-HD  . hydrocerin  1 application Topical Daily  . insulin aspart  0-9 Units Subcutaneous TID WC  . insulin detemir  9 Units Subcutaneous Q breakfast  . Melatonin  3 mg Oral QHS  . simvastatin  20 mg Oral QPM

## 2017-03-20 NOTE — Progress Notes (Signed)
[place] about transition of care to home.  Pt to have wet to dry until Monday per Maudie Flakes, WOC and then [facility] to come place the [facility].

## 2017-03-20 NOTE — Care Management Note (Signed)
Case Management Note  Patient Details  Name: MATTY VANROEKEL MRN: [id number] Date of Birth: 01/23/1941  Subjective/Objective:CM received call that pt has Vac and nursing staff unable to switch pt over to Home Vac. Consulted WOC RN and changed to [employer] to dry dressing with strict instructions to have HHRN see pt on Monday 03/22/2017 for reconnection to wound Vac. CM contacted Brad with [facility] to ensure he had active referral which was confirmed.                     Action/Plan:CM will sign off for now but will be available should additional discharge needs arise or disposition change.    Expected Discharge Date:  03/20/17               Expected Discharge Plan:  [place]  In-House Referral:  NA  Discharge planning Services  CM Consult  Post Acute Care Choice:  Home Health, Resumption of Svcs/PTA Provider Choice offered to:     DME Arranged:    DME Agency:     HH Arranged:  RN [place] Agency:  [place]  Status of Service:  Completed, signed off  If discussed at [place] of Stay Meetings, dates discussed:    Additional Comments:  Delrae Sawyers, RN 03/20/2017, 1:45 PM

## 2017-03-20 NOTE — Progress Notes (Signed)
Wet to dry dressing to affected right ischium done.Discharged instrcutions, personal belongings and extra dressing supplis given to patient. Verbalized understanding of instructions. No further questions asked.Marland KitchenDischarged via ambulance sctretcher

## 2017-03-20 NOTE — Progress Notes (Signed)
S/w Abigail Butts about plan for transition to home and he is already scheduled for a home visit to place the [facility] on Monday.

## 2017-03-20 NOTE — Discharge Summary (Signed)
Physician Discharge Summary  Lance [name] DGL:[id number] [name]: 1940/06/10 DOA: 03/18/2017  PCP: Lavone Orn, MD  Admit date: 03/18/2017 Discharge date: 03/20/2017  Admitted From: Home Disposition: Home  Recommendations for Outpatient Follow-up:  1. Follow up with PCP in 1 week 2. Repeat urinalysis to ensure resolution of hematuria 3. Please follow up on the following pending results: Blood culture (final result)  Home Health: None Equipment/Devices: None  Discharge Condition: Stable CODE STATUS: DNR Diet recommendation: Renal diet, fluid restriction   Brief/Interim Summary:  Admission HPI written by Rodena Goldmann, DO   Chief Complaint: Fevers and chills  HPI: Lance [name] is a 76 y.o. male with medical history significant of ESRD on hemodialysis Tuesday, Thursday, Saturday, recurrent UTIs with indwelling Foley catheter, and multiple sclerosis who presents to the emergency department after noticing fevers and chills that began yesterday. This started shortly after his Foley catheter was exchanged. He states that he also began having some fatigue and nausea that began after his last dialysis session 2 days ago. He has been able to maintain a good oral intake and denies any emesis. He denies any specific aggravating or alleviating factors. He has noticed drainage of cloudy urine that resembles pus in his Foley bag.  ED Course: The patient was noted to be quite febrile with a temperature of 103 Fahrenheit and was given some Tylenol in the ED. Blood cultures and urine cultures have been obtained and the patient has been started on vancomycin and Merrem on account of prior ESBL and MRSA infections with prior hospitalization. He was also given a total 1 L of normal saline. He does not have an elevated white count and lactic acid level at this time. His vital signs are otherwise stable. He is due for further dialysis today per his schedule. CXR with no acute  findings.    Hospital course:  Sepsis Secondary to UTI.  Patient treated empirically with vancomycin and meropenem secondary to history of a secondary to history of ESBL. Urine culture significant for klebsiella. Blood culture negative to date.  Vancomycin meropenem was discontinued and patient transitioned to Keflex to continue a 10-day course.  Multiple sclerosis Stable.   Sacral decubitus ulcer Present on admission.  Wound VAC in place.  Moderate protein-calorie malnutrition Secondary to chronic diseases.  ESRD on dialysis Tuesday, Thursday, Saturday.  Patient had dialysis on Friday, November 16.  Nephrology was following patient while inpatient  Diabetes mellitus, type 2 Continued home Levemir with sliding scale.  Chronic combined systolic and diastolic heart failure Last EF of 35-40% with grade 1 diastolic dysfunction and moderate diffuse hypokinesis.  Stable.    Discharge Diagnoses:  Principal Problem:   Sepsis secondary to UTI [facility]) Active Problems:   Multiple sclerosis ([place])   Sacral decubitus ulcer   ESRD on dialysis ([place])   Sepsis ([place])   Moderate protein-calorie malnutrition ([place])    Discharge Instructions  Discharge Instructions    Call MD for:  difficulty breathing, headache or visual disturbances   Complete by:  As directed    Call MD for:  persistant dizziness or light-headedness   Complete by:  As directed    Call MD for:  persistant nausea and vomiting   Complete by:  As directed    Call MD for:  temperature >100.4   Complete by:  As directed    Diet - low sodium heart healthy   Complete by:  As directed    Increase activity slowly   Complete by:  As directed      Allergies as of 03/20/2017   No Known Allergies     Medication List    TAKE these medications   baclofen 10 MG tablet Commonly known as:  LIORESAL Take 1 tablet (10 mg total) by mouth at bedtime as needed for muscle spasms.   carvedilol 3.125 MG tablet Commonly  known as:  COREG Take 1 tablet (3.125 mg total) by mouth 2 (two) times daily with a meal.   cephALEXin 500 MG capsule Commonly known as:  KEFLEX Take 1 capsule (500 mg total) at bedtime for 7 days by mouth.   eucerin cream Apply 1 application daily as needed topically for dry skin. For feet   insulin aspart 100 UNIT/ML FlexPen Commonly known as:  NOVOLOG FLEXPEN Inject 0-9 Units into the skin 3 (three) times daily with meals. Per sliding scale What changed:    how much to take  additional instructions   insulin detemir 100 UNIT/ML injection Commonly known as:  LEVEMIR Inject 9 Units into the skin daily with breakfast.   lidocaine-prilocaine cream Commonly known as:  EMLA Apply 1 application topically as needed.   Melatonin 3 MG Tabs Take 3 mg by mouth at bedtime.   nystatin powder Generic drug:  nystatin Apply 1 g daily as needed topically. To affected areas As directed   Pen Needles 30G X 8 MM Misc Inject 1 each into the skin 4 (four) times daily. .   simvastatin 20 MG tablet Commonly known as:  ZOCOR Take 20 mg by mouth every evening.   trimethoprim 100 MG tablet Commonly known as:  TRIMPEX Take 100 mg daily by mouth.      Follow-up Information    Lavone Orn, MD. Schedule an appointment as soon as possible for a visit in 1 week(s).   Specialty:  Internal Medicine Contact information: [address] [place] [address] [phone]          No Known Allergies  Consultations:  Nephrology   Procedures/Studies: Dg Chest Portable 1 View  Result Date: 03/18/2017 CLINICAL DATA:  Acute onset of cough and shortness of breath. Dry heaves. EXAM: PORTABLE CHEST 1 VIEW COMPARISON:  Chest radiograph performed 05/10/2016 FINDINGS: The lungs are well-aerated. There is incomplete visualization of the left costophrenic angle. There is no evidence of focal opacification, pleural effusion or pneumothorax. The cardiomediastinal silhouette is  within normal limits. No acute osseous abnormalities are seen. IMPRESSION: No acute cardiopulmonary process seen. Electronically Signed   By: Garald Balding M.D.   On: 03/18/2017 06:50      Subjective: No chest pain, no chest pain, dyspnea.  No chills.  No fever overnight.  No dysuria.  Discharge Exam: Vitals:   03/19/17 1955 03/20/17 0500  BP: 137/63 140/77  Pulse: 82 78  Resp: 16 16  Temp: 98 F (36.7 C) 98.6 F (37 C)  SpO2: 99% 98%   Vitals:   03/19/17 1745 03/19/17 1815 03/19/17 1955 03/20/17 0500  BP: 132/60 129/62 137/63 140/77  Pulse: 68 89 82 78  Resp: 17 16 16 16   Temp:  97.8 F (36.6 C) 98 F (36.7 C) 98.6 F (37 C)  TempSrc:  Oral Oral Oral  SpO2:  98% 99% 98%  Weight:  72.6 kg (160 lb 0.9 oz)      General: Pt is alert, awake, not in acute distress Cardiovascular: RRR, S1/S2 +, no rubs, no gallops Respiratory: CTA bilaterally, no wheezing, no rhonchi Abdominal: Soft, NT, ND, bowel  sounds + Extremities: no edema, no cyanosis    The results of significant diagnostics from this hospitalization (including imaging, microbiology, ancillary and laboratory) are listed below for reference.     Microbiology: Recent Results (from the past 240 hour(s))  Culture, blood (Routine x 2)     Status: None (Preliminary result)   Collection Time: 03/18/17  6:25 AM  Result Value Ref Range Status   Specimen Description BLOOD LEFT ARM  Final   Special Requests   Final    BOTTLES DRAWN AEROBIC AND ANAEROBIC Blood Culture results may not be optimal due to an excessive volume of blood received in culture bottles   Culture NO GROWTH 1 DAY  Final   Report Status PENDING  Incomplete  Culture, blood (Routine x 2)     Status: None (Preliminary result)   Collection Time: 03/18/17  6:58 AM  Result Value Ref Range Status   Specimen Description BLOOD LEFT FOREARM  Final   Special Requests IN PEDIATRIC BOTTLE Blood Culture adequate volume  Final   Culture NO GROWTH 1 DAY  Final    Report Status PENDING  Incomplete  Urine culture     Status: Abnormal   Collection Time: 03/18/17 12:31 PM  Result Value Ref Range Status   Specimen Description URINE, CATHETERIZED  Final   Special Requests NONE  Final   Culture >=100,000 COLONIES/mL KLEBSIELLA PNEUMONIAE (A)  Final   Report Status 03/20/2017 FINAL  Final   Organism ID, Bacteria KLEBSIELLA PNEUMONIAE (A)  Final      Susceptibility   Klebsiella pneumoniae - MIC*    AMPICILLIN >=32 RESISTANT Resistant     CEFAZOLIN <=4 SENSITIVE Sensitive     CEFTRIAXONE <=1 SENSITIVE Sensitive     CIPROFLOXACIN 1 SENSITIVE Sensitive     GENTAMICIN <=1 SENSITIVE Sensitive     IMIPENEM 0.5 SENSITIVE Sensitive     NITROFURANTOIN 128 RESISTANT Resistant     TRIMETH/SULFA >=320 RESISTANT Resistant     AMPICILLIN/SULBACTAM 4 SENSITIVE Sensitive     PIP/TAZO <=4 SENSITIVE Sensitive     Extended ESBL NEGATIVE Sensitive     * >=100,000 COLONIES/mL KLEBSIELLA PNEUMONIAE     Labs: BNP (last 3 results) Recent Labs    05/10/16 0410  BNP 510.2*   Basic Metabolic Panel: Recent Labs  Lab 03/18/17 0620 03/18/17 1046 03/19/17 1521  NA 137 134* 135  K 4.2 3.7 4.1  CL 99* 101 103  CO2 26 22 23   GLUCOSE 128* 167* 128*  BUN 58* 56* 69*  CREATININE 3.69* 3.74* 4.32*  CALCIUM 8.7* 8.0* 8.4*   Liver Function Tests: Recent Labs  Lab 03/18/17 0620 03/18/17 1046  AST 15 12*  ALT 9* 7*  ALKPHOS 85 65  BILITOT 1.2 1.1  PROT 7.7 6.5  ALBUMIN 2.7* 2.4*   No results for input(s): LIPASE, AMYLASE in the last 168 hours. No results for input(s): AMMONIA in the last 168 hours. CBC: Recent Labs  Lab 03/18/17 0620 03/18/17 1046 03/19/17 1521  WBC 5.8 11.8* 6.3  NEUTROABS 5.2 10.3*  --   HGB 10.7* 9.3* 9.2*  HCT 34.2* 29.3* 29.9*  MCV 98.3 98.0 97.4  PLT 137* 143* 152   Cardiac Enzymes: No results for input(s): CKTOTAL, CKMB, CKMBINDEX, TROPONINI in the last 168 hours. BNP: Invalid input(s): POCBNP CBG: Recent Labs  Lab  03/18/17 1558 03/19/17 0759 03/19/17 1240 03/19/17 2233 03/20/17 0803  GLUCAP 235* 118* 156* 129* 102*   D-Dimer No results for input(s): DDIMER in the last 72  hours. Hgb A1c No results for input(s): HGBA1C in the last 72 hours. Lipid Profile No results for input(s): CHOL, HDL, LDLCALC, TRIG, CHOLHDL, LDLDIRECT in the last 72 hours. Thyroid function studies No results for input(s): TSH, T4TOTAL, T3FREE, THYROIDAB in the last 72 hours.  Invalid input(s): FREET3 Anemia work up No results for input(s): VITAMINB12, FOLATE, FERRITIN, TIBC, IRON, RETICCTPCT in the last 72 hours. Urinalysis    Component Value Date/Time   COLORURINE YELLOW 03/18/2017 1234   APPEARANCEUR TURBID (A) 03/18/2017 1234   LABSPEC 1.006 03/18/2017 1234   PHURINE 7.0 03/18/2017 1234   GLUCOSEU NEGATIVE 03/18/2017 1234   HGBUR LARGE (A) 03/18/2017 1234   BILIRUBINUR NEGATIVE 03/18/2017 [place] 03/18/2017 1234   PROTEINUR 30 (A) 03/18/2017 1234   UROBILINOGEN 0.2 08/30/2014 2203   NITRITE NEGATIVE 03/18/2017 1234   LEUKOCYTESUR LARGE (A) 03/18/2017 1234   Sepsis Labs Invalid input(s): PROCALCITONIN,  WBC,  LACTICIDVEN Microbiology Recent Results (from the past 240 hour(s))  Culture, blood (Routine x 2)     Status: None (Preliminary result)   Collection Time: 03/18/17  6:25 AM  Result Value Ref Range Status   Specimen Description BLOOD LEFT ARM  Final   Special Requests   Final    BOTTLES DRAWN AEROBIC AND ANAEROBIC Blood Culture results may not be optimal due to an excessive volume of blood received in culture bottles   Culture NO GROWTH 1 DAY  Final   Report Status PENDING  Incomplete  Culture, blood (Routine x 2)     Status: None (Preliminary result)   Collection Time: 03/18/17  6:58 AM  Result Value Ref Range Status   Specimen Description BLOOD LEFT FOREARM  Final   Special Requests IN PEDIATRIC BOTTLE Blood Culture adequate volume  Final   Culture NO GROWTH 1 DAY  Final    Report Status PENDING  Incomplete  Urine culture     Status: Abnormal   Collection Time: 03/18/17 12:31 PM  Result Value Ref Range Status   Specimen Description URINE, CATHETERIZED  Final   Special Requests NONE  Final   Culture >=100,000 COLONIES/mL KLEBSIELLA PNEUMONIAE (A)  Final   Report Status 03/20/2017 FINAL  Final   Organism ID, Bacteria KLEBSIELLA PNEUMONIAE (A)  Final      Susceptibility   Klebsiella pneumoniae - MIC*    AMPICILLIN >=32 RESISTANT Resistant     CEFAZOLIN <=4 SENSITIVE Sensitive     CEFTRIAXONE <=1 SENSITIVE Sensitive     CIPROFLOXACIN 1 SENSITIVE Sensitive     GENTAMICIN <=1 SENSITIVE Sensitive     IMIPENEM 0.5 SENSITIVE Sensitive     NITROFURANTOIN 128 RESISTANT Resistant     TRIMETH/SULFA >=320 RESISTANT Resistant     AMPICILLIN/SULBACTAM 4 SENSITIVE Sensitive     PIP/TAZO <=4 SENSITIVE Sensitive     Extended ESBL NEGATIVE Sensitive     * >=100,000 COLONIES/mL KLEBSIELLA PNEUMONIAE     Time coordinating discharge: Over 30 minutes  SIGNED:   Cordelia Poche, MD [facility] 03/20/2017, 11:51 AM Pager [phone]  If 7PM-7AM, please contact night-coverage [email or web address] Password [email or web address]

## 2017-03-20 NOTE — Progress Notes (Signed)
For discharged home today. Will need ambulance for transport.Social worker on call and Tourist information centre manager were made aware . Wound vac needs to be transferred to patient's personal wound vac machine but the connector will not fit in his machine. Charge Nurse was notified ans seen patient. Patient suggesting wet to dry dressing on the wound site as he claimed Home health nurse is coming on Tuesday to check his wound. Dr. Lonny Prude was contacted and suggested to call wound Nurse. Wound Nurse Julien Girt was paged, awaiting call back.. Case Manager notified to hold transport for now.

## 2017-03-20 NOTE — Consult Note (Signed)
[place] Nurse wound follow up Response to call for assistance with patient discharge by 6N charge RN. Wound type:Pressure injury to right ischium. Chronic. Seen by my partner D. Engels yesterday for NPWT placement. Patient is for discharge today and acute care NPWT device tubing (KCI) is not compatible with home NPWT unit (Medella). Requested to advise regarding discharge/home care orders by Hospitalist, Dr. Estanislado Emms. Measurement:Per yesterday Wound bed:Per yesterday Drainage (amount, consistency, odor) As per yesterday Periwound:Intact Dressing procedure/placement/frequency:Patient for discharge today and acute care NPWT tubing is not compatible with Medella pump and cannister.  Orders provided for Bedside RN to discontinue NPWT here in the hospital and to place a normal saline dressing with dry topper in its place.  Wife is familiar with this dressing type and has performed many times.  We will provide her with the supplies necessary to perform saline wet to dry dressing changes until their [facility] can perform a resumption of care or until their next visit with the outpatient [facility]. Wife reports that the [facility] typically comes on Tuesdays; I have asked the bedside RN today to contact Case Management and request HHRN to Monday, if possible. Supplies provided in case the [facility] cannot schedule her ROC visit until Tuesday. [place] nursing team will not follow after discharge, but will remain available to this patient, the nursing and medical teams while in house.   Thank you for contacting us to assist with this patient's discharge. Maudie Flakes, MSN, RN, [place], Arther Abbott  Pager# [phone]

## 2017-03-21 ENCOUNTER — Other Ambulatory Visit: Payer: Self-pay | Admitting: Family Medicine

## 2017-03-21 ENCOUNTER — Encounter: Payer: Self-pay | Admitting: *Deleted

## 2017-03-21 DIAGNOSIS — L89159 Pressure ulcer of sacral region, unspecified stage: Secondary | ICD-10-CM

## 2017-03-21 NOTE — Care Management Note (Addendum)
Made aware by Leroy Sea with [facility] that there were no HH orders placed for [facility] with F2F prior to discharge of pt. CM has notified Nettey, MD to see if this can be remedied in the chart or via hard script that can be faxed to [facility]. Awaiting response. Will follow.  08:48: Texted MD that he could fax hard Rx to [facility] @ [phone].

## 2017-03-22 DIAGNOSIS — D631 Anemia in chronic kidney disease: Secondary | ICD-10-CM | POA: Diagnosis not present

## 2017-03-22 DIAGNOSIS — G35 Multiple sclerosis: Secondary | ICD-10-CM | POA: Diagnosis not present

## 2017-03-22 DIAGNOSIS — I120 Hypertensive chronic kidney disease with stage 5 chronic kidney disease or end stage renal disease: Secondary | ICD-10-CM | POA: Diagnosis not present

## 2017-03-22 DIAGNOSIS — L97422 Non-pressure chronic ulcer of left heel and midfoot with fat layer exposed: Secondary | ICD-10-CM | POA: Diagnosis not present

## 2017-03-22 DIAGNOSIS — E1122 Type 2 diabetes mellitus with diabetic chronic kidney disease: Secondary | ICD-10-CM | POA: Diagnosis not present

## 2017-03-22 DIAGNOSIS — Z992 Dependence on renal dialysis: Secondary | ICD-10-CM | POA: Diagnosis not present

## 2017-03-22 DIAGNOSIS — N186 End stage renal disease: Secondary | ICD-10-CM | POA: Diagnosis not present

## 2017-03-22 DIAGNOSIS — E11621 Type 2 diabetes mellitus with foot ulcer: Secondary | ICD-10-CM | POA: Diagnosis not present

## 2017-03-22 DIAGNOSIS — L89314 Pressure ulcer of right buttock, stage 4: Secondary | ICD-10-CM | POA: Diagnosis not present

## 2017-03-22 DIAGNOSIS — N2581 Secondary hyperparathyroidism of renal origin: Secondary | ICD-10-CM | POA: Diagnosis not present

## 2017-03-23 LAB — CULTURE, BLOOD (ROUTINE X 2)
CULTURE: NO GROWTH
CULTURE: NO GROWTH
SPECIAL REQUESTS: ADEQUATE

## 2017-03-24 DIAGNOSIS — N186 End stage renal disease: Secondary | ICD-10-CM | POA: Diagnosis not present

## 2017-03-24 DIAGNOSIS — G35 Multiple sclerosis: Secondary | ICD-10-CM | POA: Diagnosis not present

## 2017-03-24 DIAGNOSIS — N2581 Secondary hyperparathyroidism of renal origin: Secondary | ICD-10-CM | POA: Diagnosis not present

## 2017-03-24 DIAGNOSIS — D631 Anemia in chronic kidney disease: Secondary | ICD-10-CM | POA: Diagnosis not present

## 2017-03-24 DIAGNOSIS — Z992 Dependence on renal dialysis: Secondary | ICD-10-CM | POA: Diagnosis not present

## 2017-03-24 DIAGNOSIS — E11621 Type 2 diabetes mellitus with foot ulcer: Secondary | ICD-10-CM | POA: Diagnosis not present

## 2017-03-24 DIAGNOSIS — I120 Hypertensive chronic kidney disease with stage 5 chronic kidney disease or end stage renal disease: Secondary | ICD-10-CM | POA: Diagnosis not present

## 2017-03-24 DIAGNOSIS — E1122 Type 2 diabetes mellitus with diabetic chronic kidney disease: Secondary | ICD-10-CM | POA: Diagnosis not present

## 2017-03-24 DIAGNOSIS — L89314 Pressure ulcer of right buttock, stage 4: Secondary | ICD-10-CM | POA: Diagnosis not present

## 2017-03-24 DIAGNOSIS — L97422 Non-pressure chronic ulcer of left heel and midfoot with fat layer exposed: Secondary | ICD-10-CM | POA: Diagnosis not present

## 2017-03-26 DIAGNOSIS — L97422 Non-pressure chronic ulcer of left heel and midfoot with fat layer exposed: Secondary | ICD-10-CM | POA: Diagnosis not present

## 2017-03-26 DIAGNOSIS — E11621 Type 2 diabetes mellitus with foot ulcer: Secondary | ICD-10-CM | POA: Diagnosis not present

## 2017-03-26 DIAGNOSIS — E1122 Type 2 diabetes mellitus with diabetic chronic kidney disease: Secondary | ICD-10-CM | POA: Diagnosis not present

## 2017-03-26 DIAGNOSIS — I120 Hypertensive chronic kidney disease with stage 5 chronic kidney disease or end stage renal disease: Secondary | ICD-10-CM | POA: Diagnosis not present

## 2017-03-26 DIAGNOSIS — G35 Multiple sclerosis: Secondary | ICD-10-CM | POA: Diagnosis not present

## 2017-03-26 DIAGNOSIS — L89314 Pressure ulcer of right buttock, stage 4: Secondary | ICD-10-CM | POA: Diagnosis not present

## 2017-03-27 DIAGNOSIS — Z992 Dependence on renal dialysis: Secondary | ICD-10-CM | POA: Diagnosis not present

## 2017-03-27 DIAGNOSIS — N2581 Secondary hyperparathyroidism of renal origin: Secondary | ICD-10-CM | POA: Diagnosis not present

## 2017-03-27 DIAGNOSIS — E1122 Type 2 diabetes mellitus with diabetic chronic kidney disease: Secondary | ICD-10-CM | POA: Diagnosis not present

## 2017-03-27 DIAGNOSIS — N186 End stage renal disease: Secondary | ICD-10-CM | POA: Diagnosis not present

## 2017-03-27 DIAGNOSIS — D631 Anemia in chronic kidney disease: Secondary | ICD-10-CM | POA: Diagnosis not present

## 2017-03-30 DIAGNOSIS — E1122 Type 2 diabetes mellitus with diabetic chronic kidney disease: Secondary | ICD-10-CM | POA: Diagnosis not present

## 2017-03-30 DIAGNOSIS — I120 Hypertensive chronic kidney disease with stage 5 chronic kidney disease or end stage renal disease: Secondary | ICD-10-CM | POA: Diagnosis not present

## 2017-03-30 DIAGNOSIS — D631 Anemia in chronic kidney disease: Secondary | ICD-10-CM | POA: Diagnosis not present

## 2017-03-30 DIAGNOSIS — E11621 Type 2 diabetes mellitus with foot ulcer: Secondary | ICD-10-CM | POA: Diagnosis not present

## 2017-03-30 DIAGNOSIS — N2581 Secondary hyperparathyroidism of renal origin: Secondary | ICD-10-CM | POA: Diagnosis not present

## 2017-03-30 DIAGNOSIS — G35 Multiple sclerosis: Secondary | ICD-10-CM | POA: Diagnosis not present

## 2017-03-30 DIAGNOSIS — L97422 Non-pressure chronic ulcer of left heel and midfoot with fat layer exposed: Secondary | ICD-10-CM | POA: Diagnosis not present

## 2017-03-30 DIAGNOSIS — N186 End stage renal disease: Secondary | ICD-10-CM | POA: Diagnosis not present

## 2017-03-30 DIAGNOSIS — L89314 Pressure ulcer of right buttock, stage 4: Secondary | ICD-10-CM | POA: Diagnosis not present

## 2017-03-30 DIAGNOSIS — Z992 Dependence on renal dialysis: Secondary | ICD-10-CM | POA: Diagnosis not present

## 2017-04-01 DIAGNOSIS — Z992 Dependence on renal dialysis: Secondary | ICD-10-CM | POA: Diagnosis not present

## 2017-04-01 DIAGNOSIS — E1122 Type 2 diabetes mellitus with diabetic chronic kidney disease: Secondary | ICD-10-CM | POA: Diagnosis not present

## 2017-04-01 DIAGNOSIS — D631 Anemia in chronic kidney disease: Secondary | ICD-10-CM | POA: Diagnosis not present

## 2017-04-01 DIAGNOSIS — N186 End stage renal disease: Secondary | ICD-10-CM | POA: Diagnosis not present

## 2017-04-01 DIAGNOSIS — N2581 Secondary hyperparathyroidism of renal origin: Secondary | ICD-10-CM | POA: Diagnosis not present

## 2017-04-02 ENCOUNTER — Encounter (HOSPITAL_BASED_OUTPATIENT_CLINIC_OR_DEPARTMENT_OTHER): Payer: Medicare Other | Attending: Internal Medicine

## 2017-04-02 DIAGNOSIS — N186 End stage renal disease: Secondary | ICD-10-CM | POA: Insufficient documentation

## 2017-04-02 DIAGNOSIS — L89313 Pressure ulcer of right buttock, stage 3: Secondary | ICD-10-CM | POA: Diagnosis not present

## 2017-04-02 DIAGNOSIS — Z8614 Personal history of Methicillin resistant Staphylococcus aureus infection: Secondary | ICD-10-CM | POA: Diagnosis not present

## 2017-04-02 DIAGNOSIS — L89314 Pressure ulcer of right buttock, stage 4: Secondary | ICD-10-CM | POA: Diagnosis not present

## 2017-04-02 DIAGNOSIS — I120 Hypertensive chronic kidney disease with stage 5 chronic kidney disease or end stage renal disease: Secondary | ICD-10-CM | POA: Diagnosis not present

## 2017-04-02 DIAGNOSIS — E1122 Type 2 diabetes mellitus with diabetic chronic kidney disease: Secondary | ICD-10-CM | POA: Insufficient documentation

## 2017-04-02 DIAGNOSIS — Z992 Dependence on renal dialysis: Secondary | ICD-10-CM | POA: Diagnosis not present

## 2017-04-02 DIAGNOSIS — G35 Multiple sclerosis: Secondary | ICD-10-CM | POA: Diagnosis not present

## 2017-04-03 DIAGNOSIS — E1122 Type 2 diabetes mellitus with diabetic chronic kidney disease: Secondary | ICD-10-CM | POA: Diagnosis not present

## 2017-04-03 DIAGNOSIS — Z992 Dependence on renal dialysis: Secondary | ICD-10-CM | POA: Diagnosis not present

## 2017-04-03 DIAGNOSIS — D631 Anemia in chronic kidney disease: Secondary | ICD-10-CM | POA: Diagnosis not present

## 2017-04-03 DIAGNOSIS — N186 End stage renal disease: Secondary | ICD-10-CM | POA: Diagnosis not present

## 2017-04-03 DIAGNOSIS — N2581 Secondary hyperparathyroidism of renal origin: Secondary | ICD-10-CM | POA: Diagnosis not present

## 2017-04-05 DIAGNOSIS — I120 Hypertensive chronic kidney disease with stage 5 chronic kidney disease or end stage renal disease: Secondary | ICD-10-CM | POA: Diagnosis not present

## 2017-04-05 DIAGNOSIS — L97422 Non-pressure chronic ulcer of left heel and midfoot with fat layer exposed: Secondary | ICD-10-CM | POA: Diagnosis not present

## 2017-04-05 DIAGNOSIS — G35 Multiple sclerosis: Secondary | ICD-10-CM | POA: Diagnosis not present

## 2017-04-05 DIAGNOSIS — E1122 Type 2 diabetes mellitus with diabetic chronic kidney disease: Secondary | ICD-10-CM | POA: Diagnosis not present

## 2017-04-05 DIAGNOSIS — E11621 Type 2 diabetes mellitus with foot ulcer: Secondary | ICD-10-CM | POA: Diagnosis not present

## 2017-04-05 DIAGNOSIS — L89314 Pressure ulcer of right buttock, stage 4: Secondary | ICD-10-CM | POA: Diagnosis not present

## 2017-04-06 DIAGNOSIS — N186 End stage renal disease: Secondary | ICD-10-CM | POA: Diagnosis not present

## 2017-04-06 DIAGNOSIS — Z992 Dependence on renal dialysis: Secondary | ICD-10-CM | POA: Diagnosis not present

## 2017-04-06 DIAGNOSIS — D631 Anemia in chronic kidney disease: Secondary | ICD-10-CM | POA: Diagnosis not present

## 2017-04-06 DIAGNOSIS — E1122 Type 2 diabetes mellitus with diabetic chronic kidney disease: Secondary | ICD-10-CM | POA: Diagnosis not present

## 2017-04-06 DIAGNOSIS — N2581 Secondary hyperparathyroidism of renal origin: Secondary | ICD-10-CM | POA: Diagnosis not present

## 2017-04-07 DIAGNOSIS — G35 Multiple sclerosis: Secondary | ICD-10-CM | POA: Diagnosis not present

## 2017-04-07 DIAGNOSIS — N186 End stage renal disease: Secondary | ICD-10-CM | POA: Diagnosis not present

## 2017-04-07 DIAGNOSIS — L97422 Non-pressure chronic ulcer of left heel and midfoot with fat layer exposed: Secondary | ICD-10-CM | POA: Diagnosis not present

## 2017-04-07 DIAGNOSIS — L89314 Pressure ulcer of right buttock, stage 4: Secondary | ICD-10-CM | POA: Diagnosis not present

## 2017-04-07 DIAGNOSIS — N390 Urinary tract infection, site not specified: Secondary | ICD-10-CM | POA: Diagnosis not present

## 2017-04-07 DIAGNOSIS — E1122 Type 2 diabetes mellitus with diabetic chronic kidney disease: Secondary | ICD-10-CM | POA: Diagnosis not present

## 2017-04-07 DIAGNOSIS — E11621 Type 2 diabetes mellitus with foot ulcer: Secondary | ICD-10-CM | POA: Diagnosis not present

## 2017-04-07 DIAGNOSIS — Z794 Long term (current) use of insulin: Secondary | ICD-10-CM | POA: Diagnosis not present

## 2017-04-07 DIAGNOSIS — I129 Hypertensive chronic kidney disease with stage 1 through stage 4 chronic kidney disease, or unspecified chronic kidney disease: Secondary | ICD-10-CM | POA: Diagnosis not present

## 2017-04-07 DIAGNOSIS — I120 Hypertensive chronic kidney disease with stage 5 chronic kidney disease or end stage renal disease: Secondary | ICD-10-CM | POA: Diagnosis not present

## 2017-04-07 DIAGNOSIS — Z992 Dependence on renal dialysis: Secondary | ICD-10-CM | POA: Diagnosis not present

## 2017-04-08 DIAGNOSIS — N186 End stage renal disease: Secondary | ICD-10-CM | POA: Diagnosis not present

## 2017-04-08 DIAGNOSIS — D631 Anemia in chronic kidney disease: Secondary | ICD-10-CM | POA: Diagnosis not present

## 2017-04-08 DIAGNOSIS — Z992 Dependence on renal dialysis: Secondary | ICD-10-CM | POA: Diagnosis not present

## 2017-04-08 DIAGNOSIS — E1122 Type 2 diabetes mellitus with diabetic chronic kidney disease: Secondary | ICD-10-CM | POA: Diagnosis not present

## 2017-04-08 DIAGNOSIS — N2581 Secondary hyperparathyroidism of renal origin: Secondary | ICD-10-CM | POA: Diagnosis not present

## 2017-04-09 DIAGNOSIS — L89314 Pressure ulcer of right buttock, stage 4: Secondary | ICD-10-CM | POA: Diagnosis not present

## 2017-04-09 DIAGNOSIS — E1122 Type 2 diabetes mellitus with diabetic chronic kidney disease: Secondary | ICD-10-CM | POA: Diagnosis not present

## 2017-04-09 DIAGNOSIS — I120 Hypertensive chronic kidney disease with stage 5 chronic kidney disease or end stage renal disease: Secondary | ICD-10-CM | POA: Diagnosis not present

## 2017-04-09 DIAGNOSIS — E11621 Type 2 diabetes mellitus with foot ulcer: Secondary | ICD-10-CM | POA: Diagnosis not present

## 2017-04-09 DIAGNOSIS — G35 Multiple sclerosis: Secondary | ICD-10-CM | POA: Diagnosis not present

## 2017-04-09 DIAGNOSIS — L97422 Non-pressure chronic ulcer of left heel and midfoot with fat layer exposed: Secondary | ICD-10-CM | POA: Diagnosis not present

## 2017-04-10 DIAGNOSIS — D631 Anemia in chronic kidney disease: Secondary | ICD-10-CM | POA: Diagnosis not present

## 2017-04-10 DIAGNOSIS — N2581 Secondary hyperparathyroidism of renal origin: Secondary | ICD-10-CM | POA: Diagnosis not present

## 2017-04-10 DIAGNOSIS — N186 End stage renal disease: Secondary | ICD-10-CM | POA: Diagnosis not present

## 2017-04-10 DIAGNOSIS — E1122 Type 2 diabetes mellitus with diabetic chronic kidney disease: Secondary | ICD-10-CM | POA: Diagnosis not present

## 2017-04-10 DIAGNOSIS — Z992 Dependence on renal dialysis: Secondary | ICD-10-CM | POA: Diagnosis not present

## 2017-04-13 DIAGNOSIS — N186 End stage renal disease: Secondary | ICD-10-CM | POA: Diagnosis not present

## 2017-04-13 DIAGNOSIS — D631 Anemia in chronic kidney disease: Secondary | ICD-10-CM | POA: Diagnosis not present

## 2017-04-13 DIAGNOSIS — N2581 Secondary hyperparathyroidism of renal origin: Secondary | ICD-10-CM | POA: Diagnosis not present

## 2017-04-13 DIAGNOSIS — Z992 Dependence on renal dialysis: Secondary | ICD-10-CM | POA: Diagnosis not present

## 2017-04-13 DIAGNOSIS — E1122 Type 2 diabetes mellitus with diabetic chronic kidney disease: Secondary | ICD-10-CM | POA: Diagnosis not present

## 2017-04-14 DIAGNOSIS — I120 Hypertensive chronic kidney disease with stage 5 chronic kidney disease or end stage renal disease: Secondary | ICD-10-CM | POA: Diagnosis not present

## 2017-04-14 DIAGNOSIS — L97422 Non-pressure chronic ulcer of left heel and midfoot with fat layer exposed: Secondary | ICD-10-CM | POA: Diagnosis not present

## 2017-04-14 DIAGNOSIS — G35 Multiple sclerosis: Secondary | ICD-10-CM | POA: Diagnosis not present

## 2017-04-14 DIAGNOSIS — L89314 Pressure ulcer of right buttock, stage 4: Secondary | ICD-10-CM | POA: Diagnosis not present

## 2017-04-14 DIAGNOSIS — E11621 Type 2 diabetes mellitus with foot ulcer: Secondary | ICD-10-CM | POA: Diagnosis not present

## 2017-04-14 DIAGNOSIS — E1122 Type 2 diabetes mellitus with diabetic chronic kidney disease: Secondary | ICD-10-CM | POA: Diagnosis not present

## 2017-04-15 DIAGNOSIS — E1122 Type 2 diabetes mellitus with diabetic chronic kidney disease: Secondary | ICD-10-CM | POA: Diagnosis not present

## 2017-04-15 DIAGNOSIS — D631 Anemia in chronic kidney disease: Secondary | ICD-10-CM | POA: Diagnosis not present

## 2017-04-15 DIAGNOSIS — N186 End stage renal disease: Secondary | ICD-10-CM | POA: Diagnosis not present

## 2017-04-15 DIAGNOSIS — Z992 Dependence on renal dialysis: Secondary | ICD-10-CM | POA: Diagnosis not present

## 2017-04-15 DIAGNOSIS — N2581 Secondary hyperparathyroidism of renal origin: Secondary | ICD-10-CM | POA: Diagnosis not present

## 2017-04-16 DIAGNOSIS — E11621 Type 2 diabetes mellitus with foot ulcer: Secondary | ICD-10-CM | POA: Diagnosis not present

## 2017-04-16 DIAGNOSIS — G35 Multiple sclerosis: Secondary | ICD-10-CM | POA: Diagnosis not present

## 2017-04-16 DIAGNOSIS — L97422 Non-pressure chronic ulcer of left heel and midfoot with fat layer exposed: Secondary | ICD-10-CM | POA: Diagnosis not present

## 2017-04-16 DIAGNOSIS — I120 Hypertensive chronic kidney disease with stage 5 chronic kidney disease or end stage renal disease: Secondary | ICD-10-CM | POA: Diagnosis not present

## 2017-04-16 DIAGNOSIS — L89314 Pressure ulcer of right buttock, stage 4: Secondary | ICD-10-CM | POA: Diagnosis not present

## 2017-04-16 DIAGNOSIS — E1122 Type 2 diabetes mellitus with diabetic chronic kidney disease: Secondary | ICD-10-CM | POA: Diagnosis not present

## 2017-04-17 DIAGNOSIS — E1122 Type 2 diabetes mellitus with diabetic chronic kidney disease: Secondary | ICD-10-CM | POA: Diagnosis not present

## 2017-04-17 DIAGNOSIS — N2581 Secondary hyperparathyroidism of renal origin: Secondary | ICD-10-CM | POA: Diagnosis not present

## 2017-04-17 DIAGNOSIS — D631 Anemia in chronic kidney disease: Secondary | ICD-10-CM | POA: Diagnosis not present

## 2017-04-17 DIAGNOSIS — Z992 Dependence on renal dialysis: Secondary | ICD-10-CM | POA: Diagnosis not present

## 2017-04-17 DIAGNOSIS — N186 End stage renal disease: Secondary | ICD-10-CM | POA: Diagnosis not present

## 2017-04-19 DIAGNOSIS — N390 Urinary tract infection, site not specified: Secondary | ICD-10-CM | POA: Diagnosis not present

## 2017-04-19 DIAGNOSIS — G35 Multiple sclerosis: Secondary | ICD-10-CM | POA: Diagnosis not present

## 2017-04-19 DIAGNOSIS — E1122 Type 2 diabetes mellitus with diabetic chronic kidney disease: Secondary | ICD-10-CM | POA: Diagnosis not present

## 2017-04-19 DIAGNOSIS — I120 Hypertensive chronic kidney disease with stage 5 chronic kidney disease or end stage renal disease: Secondary | ICD-10-CM | POA: Diagnosis not present

## 2017-04-19 DIAGNOSIS — L89314 Pressure ulcer of right buttock, stage 4: Secondary | ICD-10-CM | POA: Diagnosis not present

## 2017-04-19 DIAGNOSIS — E11621 Type 2 diabetes mellitus with foot ulcer: Secondary | ICD-10-CM | POA: Diagnosis not present

## 2017-04-19 DIAGNOSIS — L97422 Non-pressure chronic ulcer of left heel and midfoot with fat layer exposed: Secondary | ICD-10-CM | POA: Diagnosis not present

## 2017-04-20 DIAGNOSIS — Z992 Dependence on renal dialysis: Secondary | ICD-10-CM | POA: Diagnosis not present

## 2017-04-20 DIAGNOSIS — E1122 Type 2 diabetes mellitus with diabetic chronic kidney disease: Secondary | ICD-10-CM | POA: Diagnosis not present

## 2017-04-20 DIAGNOSIS — N2581 Secondary hyperparathyroidism of renal origin: Secondary | ICD-10-CM | POA: Diagnosis not present

## 2017-04-20 DIAGNOSIS — D631 Anemia in chronic kidney disease: Secondary | ICD-10-CM | POA: Diagnosis not present

## 2017-04-20 DIAGNOSIS — N186 End stage renal disease: Secondary | ICD-10-CM | POA: Diagnosis not present

## 2017-04-21 DIAGNOSIS — G35 Multiple sclerosis: Secondary | ICD-10-CM | POA: Diagnosis not present

## 2017-04-21 DIAGNOSIS — L97422 Non-pressure chronic ulcer of left heel and midfoot with fat layer exposed: Secondary | ICD-10-CM | POA: Diagnosis not present

## 2017-04-21 DIAGNOSIS — E1122 Type 2 diabetes mellitus with diabetic chronic kidney disease: Secondary | ICD-10-CM | POA: Diagnosis not present

## 2017-04-21 DIAGNOSIS — L89314 Pressure ulcer of right buttock, stage 4: Secondary | ICD-10-CM | POA: Diagnosis not present

## 2017-04-21 DIAGNOSIS — I120 Hypertensive chronic kidney disease with stage 5 chronic kidney disease or end stage renal disease: Secondary | ICD-10-CM | POA: Diagnosis not present

## 2017-04-21 DIAGNOSIS — E11621 Type 2 diabetes mellitus with foot ulcer: Secondary | ICD-10-CM | POA: Diagnosis not present

## 2017-04-22 DIAGNOSIS — E1122 Type 2 diabetes mellitus with diabetic chronic kidney disease: Secondary | ICD-10-CM | POA: Diagnosis not present

## 2017-04-22 DIAGNOSIS — D631 Anemia in chronic kidney disease: Secondary | ICD-10-CM | POA: Diagnosis not present

## 2017-04-22 DIAGNOSIS — N2581 Secondary hyperparathyroidism of renal origin: Secondary | ICD-10-CM | POA: Diagnosis not present

## 2017-04-22 DIAGNOSIS — N186 End stage renal disease: Secondary | ICD-10-CM | POA: Diagnosis not present

## 2017-04-22 DIAGNOSIS — Z992 Dependence on renal dialysis: Secondary | ICD-10-CM | POA: Diagnosis not present

## 2017-04-23 DIAGNOSIS — E11621 Type 2 diabetes mellitus with foot ulcer: Secondary | ICD-10-CM | POA: Diagnosis not present

## 2017-04-23 DIAGNOSIS — I120 Hypertensive chronic kidney disease with stage 5 chronic kidney disease or end stage renal disease: Secondary | ICD-10-CM | POA: Diagnosis not present

## 2017-04-23 DIAGNOSIS — L97422 Non-pressure chronic ulcer of left heel and midfoot with fat layer exposed: Secondary | ICD-10-CM | POA: Diagnosis not present

## 2017-04-23 DIAGNOSIS — L89314 Pressure ulcer of right buttock, stage 4: Secondary | ICD-10-CM | POA: Diagnosis not present

## 2017-04-23 DIAGNOSIS — E1122 Type 2 diabetes mellitus with diabetic chronic kidney disease: Secondary | ICD-10-CM | POA: Diagnosis not present

## 2017-04-23 DIAGNOSIS — G35 Multiple sclerosis: Secondary | ICD-10-CM | POA: Diagnosis not present

## 2017-04-24 DIAGNOSIS — E1122 Type 2 diabetes mellitus with diabetic chronic kidney disease: Secondary | ICD-10-CM | POA: Diagnosis not present

## 2017-04-24 DIAGNOSIS — D631 Anemia in chronic kidney disease: Secondary | ICD-10-CM | POA: Diagnosis not present

## 2017-04-24 DIAGNOSIS — N186 End stage renal disease: Secondary | ICD-10-CM | POA: Diagnosis not present

## 2017-04-24 DIAGNOSIS — Z992 Dependence on renal dialysis: Secondary | ICD-10-CM | POA: Diagnosis not present

## 2017-04-24 DIAGNOSIS — N2581 Secondary hyperparathyroidism of renal origin: Secondary | ICD-10-CM | POA: Diagnosis not present

## 2017-04-26 DIAGNOSIS — E1122 Type 2 diabetes mellitus with diabetic chronic kidney disease: Secondary | ICD-10-CM | POA: Diagnosis not present

## 2017-04-26 DIAGNOSIS — N186 End stage renal disease: Secondary | ICD-10-CM | POA: Diagnosis not present

## 2017-04-26 DIAGNOSIS — L89314 Pressure ulcer of right buttock, stage 4: Secondary | ICD-10-CM | POA: Diagnosis not present

## 2017-04-26 DIAGNOSIS — D631 Anemia in chronic kidney disease: Secondary | ICD-10-CM | POA: Diagnosis not present

## 2017-04-26 DIAGNOSIS — N2581 Secondary hyperparathyroidism of renal origin: Secondary | ICD-10-CM | POA: Diagnosis not present

## 2017-04-26 DIAGNOSIS — Z992 Dependence on renal dialysis: Secondary | ICD-10-CM | POA: Diagnosis not present

## 2017-04-26 DIAGNOSIS — E11621 Type 2 diabetes mellitus with foot ulcer: Secondary | ICD-10-CM | POA: Diagnosis not present

## 2017-04-26 DIAGNOSIS — L97422 Non-pressure chronic ulcer of left heel and midfoot with fat layer exposed: Secondary | ICD-10-CM | POA: Diagnosis not present

## 2017-04-26 DIAGNOSIS — I120 Hypertensive chronic kidney disease with stage 5 chronic kidney disease or end stage renal disease: Secondary | ICD-10-CM | POA: Diagnosis not present

## 2017-04-26 DIAGNOSIS — G35 Multiple sclerosis: Secondary | ICD-10-CM | POA: Diagnosis not present

## 2017-04-28 DIAGNOSIS — E11621 Type 2 diabetes mellitus with foot ulcer: Secondary | ICD-10-CM | POA: Diagnosis not present

## 2017-04-28 DIAGNOSIS — L89314 Pressure ulcer of right buttock, stage 4: Secondary | ICD-10-CM | POA: Diagnosis not present

## 2017-04-28 DIAGNOSIS — L97422 Non-pressure chronic ulcer of left heel and midfoot with fat layer exposed: Secondary | ICD-10-CM | POA: Diagnosis not present

## 2017-04-28 DIAGNOSIS — E1122 Type 2 diabetes mellitus with diabetic chronic kidney disease: Secondary | ICD-10-CM | POA: Diagnosis not present

## 2017-04-28 DIAGNOSIS — I120 Hypertensive chronic kidney disease with stage 5 chronic kidney disease or end stage renal disease: Secondary | ICD-10-CM | POA: Diagnosis not present

## 2017-04-28 DIAGNOSIS — G35 Multiple sclerosis: Secondary | ICD-10-CM | POA: Diagnosis not present

## 2017-04-29 DIAGNOSIS — D631 Anemia in chronic kidney disease: Secondary | ICD-10-CM | POA: Diagnosis not present

## 2017-04-29 DIAGNOSIS — E1122 Type 2 diabetes mellitus with diabetic chronic kidney disease: Secondary | ICD-10-CM | POA: Diagnosis not present

## 2017-04-29 DIAGNOSIS — N2581 Secondary hyperparathyroidism of renal origin: Secondary | ICD-10-CM | POA: Diagnosis not present

## 2017-04-29 DIAGNOSIS — Z992 Dependence on renal dialysis: Secondary | ICD-10-CM | POA: Diagnosis not present

## 2017-04-29 DIAGNOSIS — N186 End stage renal disease: Secondary | ICD-10-CM | POA: Diagnosis not present

## 2017-04-30 DIAGNOSIS — L97422 Non-pressure chronic ulcer of left heel and midfoot with fat layer exposed: Secondary | ICD-10-CM | POA: Diagnosis not present

## 2017-04-30 DIAGNOSIS — I120 Hypertensive chronic kidney disease with stage 5 chronic kidney disease or end stage renal disease: Secondary | ICD-10-CM | POA: Diagnosis not present

## 2017-04-30 DIAGNOSIS — L89314 Pressure ulcer of right buttock, stage 4: Secondary | ICD-10-CM | POA: Diagnosis not present

## 2017-04-30 DIAGNOSIS — E11621 Type 2 diabetes mellitus with foot ulcer: Secondary | ICD-10-CM | POA: Diagnosis not present

## 2017-04-30 DIAGNOSIS — G35 Multiple sclerosis: Secondary | ICD-10-CM | POA: Diagnosis not present

## 2017-04-30 DIAGNOSIS — E1122 Type 2 diabetes mellitus with diabetic chronic kidney disease: Secondary | ICD-10-CM | POA: Diagnosis not present

## 2017-05-01 DIAGNOSIS — Z992 Dependence on renal dialysis: Secondary | ICD-10-CM | POA: Diagnosis not present

## 2017-05-01 DIAGNOSIS — N2581 Secondary hyperparathyroidism of renal origin: Secondary | ICD-10-CM | POA: Diagnosis not present

## 2017-05-01 DIAGNOSIS — N186 End stage renal disease: Secondary | ICD-10-CM | POA: Diagnosis not present

## 2017-05-01 DIAGNOSIS — E1122 Type 2 diabetes mellitus with diabetic chronic kidney disease: Secondary | ICD-10-CM | POA: Diagnosis not present

## 2017-05-01 DIAGNOSIS — D631 Anemia in chronic kidney disease: Secondary | ICD-10-CM | POA: Diagnosis not present

## 2017-05-03 DIAGNOSIS — E1122 Type 2 diabetes mellitus with diabetic chronic kidney disease: Secondary | ICD-10-CM | POA: Diagnosis not present

## 2017-05-03 DIAGNOSIS — N2581 Secondary hyperparathyroidism of renal origin: Secondary | ICD-10-CM | POA: Diagnosis not present

## 2017-05-03 DIAGNOSIS — N186 End stage renal disease: Secondary | ICD-10-CM | POA: Diagnosis not present

## 2017-05-03 DIAGNOSIS — D631 Anemia in chronic kidney disease: Secondary | ICD-10-CM | POA: Diagnosis not present

## 2017-05-03 DIAGNOSIS — Z992 Dependence on renal dialysis: Secondary | ICD-10-CM | POA: Diagnosis not present

## 2017-05-05 DIAGNOSIS — L97422 Non-pressure chronic ulcer of left heel and midfoot with fat layer exposed: Secondary | ICD-10-CM | POA: Diagnosis not present

## 2017-05-05 DIAGNOSIS — E11621 Type 2 diabetes mellitus with foot ulcer: Secondary | ICD-10-CM | POA: Diagnosis not present

## 2017-05-05 DIAGNOSIS — L89314 Pressure ulcer of right buttock, stage 4: Secondary | ICD-10-CM | POA: Diagnosis not present

## 2017-05-05 DIAGNOSIS — I120 Hypertensive chronic kidney disease with stage 5 chronic kidney disease or end stage renal disease: Secondary | ICD-10-CM | POA: Diagnosis not present

## 2017-05-05 DIAGNOSIS — G35 Multiple sclerosis: Secondary | ICD-10-CM | POA: Diagnosis not present

## 2017-05-05 DIAGNOSIS — E1122 Type 2 diabetes mellitus with diabetic chronic kidney disease: Secondary | ICD-10-CM | POA: Diagnosis not present

## 2017-05-06 DIAGNOSIS — E1122 Type 2 diabetes mellitus with diabetic chronic kidney disease: Secondary | ICD-10-CM | POA: Diagnosis not present

## 2017-05-06 DIAGNOSIS — N186 End stage renal disease: Secondary | ICD-10-CM | POA: Diagnosis not present

## 2017-05-06 DIAGNOSIS — D631 Anemia in chronic kidney disease: Secondary | ICD-10-CM | POA: Diagnosis not present

## 2017-05-06 DIAGNOSIS — N2581 Secondary hyperparathyroidism of renal origin: Secondary | ICD-10-CM | POA: Diagnosis not present

## 2017-05-06 DIAGNOSIS — Z23 Encounter for immunization: Secondary | ICD-10-CM | POA: Diagnosis not present

## 2017-05-06 DIAGNOSIS — Z992 Dependence on renal dialysis: Secondary | ICD-10-CM | POA: Diagnosis not present

## 2017-05-07 ENCOUNTER — Encounter (HOSPITAL_BASED_OUTPATIENT_CLINIC_OR_DEPARTMENT_OTHER): Payer: Medicare Other | Attending: Internal Medicine

## 2017-05-07 DIAGNOSIS — N186 End stage renal disease: Secondary | ICD-10-CM | POA: Insufficient documentation

## 2017-05-07 DIAGNOSIS — I1311 Hypertensive heart and chronic kidney disease without heart failure, with stage 5 chronic kidney disease, or end stage renal disease: Secondary | ICD-10-CM | POA: Insufficient documentation

## 2017-05-07 DIAGNOSIS — G35 Multiple sclerosis: Secondary | ICD-10-CM | POA: Insufficient documentation

## 2017-05-07 DIAGNOSIS — L89314 Pressure ulcer of right buttock, stage 4: Secondary | ICD-10-CM | POA: Diagnosis not present

## 2017-05-07 DIAGNOSIS — L89313 Pressure ulcer of right buttock, stage 3: Secondary | ICD-10-CM | POA: Diagnosis not present

## 2017-05-08 DIAGNOSIS — N2581 Secondary hyperparathyroidism of renal origin: Secondary | ICD-10-CM | POA: Diagnosis not present

## 2017-05-08 DIAGNOSIS — Z23 Encounter for immunization: Secondary | ICD-10-CM | POA: Diagnosis not present

## 2017-05-08 DIAGNOSIS — D631 Anemia in chronic kidney disease: Secondary | ICD-10-CM | POA: Diagnosis not present

## 2017-05-08 DIAGNOSIS — N186 End stage renal disease: Secondary | ICD-10-CM | POA: Diagnosis not present

## 2017-05-08 DIAGNOSIS — E1122 Type 2 diabetes mellitus with diabetic chronic kidney disease: Secondary | ICD-10-CM | POA: Diagnosis not present

## 2017-05-08 DIAGNOSIS — Z992 Dependence on renal dialysis: Secondary | ICD-10-CM | POA: Diagnosis not present

## 2017-05-10 DIAGNOSIS — E11621 Type 2 diabetes mellitus with foot ulcer: Secondary | ICD-10-CM | POA: Diagnosis not present

## 2017-05-10 DIAGNOSIS — E1122 Type 2 diabetes mellitus with diabetic chronic kidney disease: Secondary | ICD-10-CM | POA: Diagnosis not present

## 2017-05-10 DIAGNOSIS — L89314 Pressure ulcer of right buttock, stage 4: Secondary | ICD-10-CM | POA: Diagnosis not present

## 2017-05-10 DIAGNOSIS — L97422 Non-pressure chronic ulcer of left heel and midfoot with fat layer exposed: Secondary | ICD-10-CM | POA: Diagnosis not present

## 2017-05-10 DIAGNOSIS — I120 Hypertensive chronic kidney disease with stage 5 chronic kidney disease or end stage renal disease: Secondary | ICD-10-CM | POA: Diagnosis not present

## 2017-05-10 DIAGNOSIS — G35 Multiple sclerosis: Secondary | ICD-10-CM | POA: Diagnosis not present

## 2017-05-11 DIAGNOSIS — E1122 Type 2 diabetes mellitus with diabetic chronic kidney disease: Secondary | ICD-10-CM | POA: Diagnosis not present

## 2017-05-11 DIAGNOSIS — D631 Anemia in chronic kidney disease: Secondary | ICD-10-CM | POA: Diagnosis not present

## 2017-05-11 DIAGNOSIS — Z23 Encounter for immunization: Secondary | ICD-10-CM | POA: Diagnosis not present

## 2017-05-11 DIAGNOSIS — N2581 Secondary hyperparathyroidism of renal origin: Secondary | ICD-10-CM | POA: Diagnosis not present

## 2017-05-11 DIAGNOSIS — N186 End stage renal disease: Secondary | ICD-10-CM | POA: Diagnosis not present

## 2017-05-11 DIAGNOSIS — Z992 Dependence on renal dialysis: Secondary | ICD-10-CM | POA: Diagnosis not present

## 2017-05-12 DIAGNOSIS — E11621 Type 2 diabetes mellitus with foot ulcer: Secondary | ICD-10-CM | POA: Diagnosis not present

## 2017-05-12 DIAGNOSIS — L97422 Non-pressure chronic ulcer of left heel and midfoot with fat layer exposed: Secondary | ICD-10-CM | POA: Diagnosis not present

## 2017-05-12 DIAGNOSIS — I120 Hypertensive chronic kidney disease with stage 5 chronic kidney disease or end stage renal disease: Secondary | ICD-10-CM | POA: Diagnosis not present

## 2017-05-12 DIAGNOSIS — L89314 Pressure ulcer of right buttock, stage 4: Secondary | ICD-10-CM | POA: Diagnosis not present

## 2017-05-12 DIAGNOSIS — E1122 Type 2 diabetes mellitus with diabetic chronic kidney disease: Secondary | ICD-10-CM | POA: Diagnosis not present

## 2017-05-12 DIAGNOSIS — G35 Multiple sclerosis: Secondary | ICD-10-CM | POA: Diagnosis not present

## 2017-05-13 DIAGNOSIS — Z992 Dependence on renal dialysis: Secondary | ICD-10-CM | POA: Diagnosis not present

## 2017-05-13 DIAGNOSIS — N2581 Secondary hyperparathyroidism of renal origin: Secondary | ICD-10-CM | POA: Diagnosis not present

## 2017-05-13 DIAGNOSIS — D631 Anemia in chronic kidney disease: Secondary | ICD-10-CM | POA: Diagnosis not present

## 2017-05-13 DIAGNOSIS — Z23 Encounter for immunization: Secondary | ICD-10-CM | POA: Diagnosis not present

## 2017-05-13 DIAGNOSIS — E1122 Type 2 diabetes mellitus with diabetic chronic kidney disease: Secondary | ICD-10-CM | POA: Diagnosis not present

## 2017-05-13 DIAGNOSIS — N186 End stage renal disease: Secondary | ICD-10-CM | POA: Diagnosis not present

## 2017-05-14 DIAGNOSIS — L97422 Non-pressure chronic ulcer of left heel and midfoot with fat layer exposed: Secondary | ICD-10-CM | POA: Diagnosis not present

## 2017-05-14 DIAGNOSIS — E1122 Type 2 diabetes mellitus with diabetic chronic kidney disease: Secondary | ICD-10-CM | POA: Diagnosis not present

## 2017-05-14 DIAGNOSIS — G35 Multiple sclerosis: Secondary | ICD-10-CM | POA: Diagnosis not present

## 2017-05-14 DIAGNOSIS — E11621 Type 2 diabetes mellitus with foot ulcer: Secondary | ICD-10-CM | POA: Diagnosis not present

## 2017-05-14 DIAGNOSIS — I120 Hypertensive chronic kidney disease with stage 5 chronic kidney disease or end stage renal disease: Secondary | ICD-10-CM | POA: Diagnosis not present

## 2017-05-14 DIAGNOSIS — L89314 Pressure ulcer of right buttock, stage 4: Secondary | ICD-10-CM | POA: Diagnosis not present

## 2017-05-15 DIAGNOSIS — N2581 Secondary hyperparathyroidism of renal origin: Secondary | ICD-10-CM | POA: Diagnosis not present

## 2017-05-15 DIAGNOSIS — N186 End stage renal disease: Secondary | ICD-10-CM | POA: Diagnosis not present

## 2017-05-15 DIAGNOSIS — Z992 Dependence on renal dialysis: Secondary | ICD-10-CM | POA: Diagnosis not present

## 2017-05-15 DIAGNOSIS — E1122 Type 2 diabetes mellitus with diabetic chronic kidney disease: Secondary | ICD-10-CM | POA: Diagnosis not present

## 2017-05-15 DIAGNOSIS — Z23 Encounter for immunization: Secondary | ICD-10-CM | POA: Diagnosis not present

## 2017-05-15 DIAGNOSIS — D631 Anemia in chronic kidney disease: Secondary | ICD-10-CM | POA: Diagnosis not present

## 2017-05-16 DIAGNOSIS — N319 Neuromuscular dysfunction of bladder, unspecified: Secondary | ICD-10-CM | POA: Diagnosis not present

## 2017-05-16 DIAGNOSIS — Z8701 Personal history of pneumonia (recurrent): Secondary | ICD-10-CM | POA: Diagnosis not present

## 2017-05-16 DIAGNOSIS — Z466 Encounter for fitting and adjustment of urinary device: Secondary | ICD-10-CM | POA: Diagnosis not present

## 2017-05-16 DIAGNOSIS — N186 End stage renal disease: Secondary | ICD-10-CM | POA: Diagnosis not present

## 2017-05-16 DIAGNOSIS — I5042 Chronic combined systolic (congestive) and diastolic (congestive) heart failure: Secondary | ICD-10-CM | POA: Diagnosis not present

## 2017-05-16 DIAGNOSIS — R1312 Dysphagia, oropharyngeal phase: Secondary | ICD-10-CM | POA: Diagnosis not present

## 2017-05-16 DIAGNOSIS — E1122 Type 2 diabetes mellitus with diabetic chronic kidney disease: Secondary | ICD-10-CM | POA: Diagnosis not present

## 2017-05-16 DIAGNOSIS — E785 Hyperlipidemia, unspecified: Secondary | ICD-10-CM | POA: Diagnosis not present

## 2017-05-16 DIAGNOSIS — L89622 Pressure ulcer of left heel, stage 2: Secondary | ICD-10-CM | POA: Diagnosis not present

## 2017-05-16 DIAGNOSIS — Z89422 Acquired absence of other left toe(s): Secondary | ICD-10-CM | POA: Diagnosis not present

## 2017-05-16 DIAGNOSIS — Z794 Long term (current) use of insulin: Secondary | ICD-10-CM | POA: Diagnosis not present

## 2017-05-16 DIAGNOSIS — G8220 Paraplegia, unspecified: Secondary | ICD-10-CM | POA: Diagnosis not present

## 2017-05-16 DIAGNOSIS — L89621 Pressure ulcer of left heel, stage 1: Secondary | ICD-10-CM | POA: Diagnosis not present

## 2017-05-16 DIAGNOSIS — E43 Unspecified severe protein-calorie malnutrition: Secondary | ICD-10-CM | POA: Diagnosis not present

## 2017-05-16 DIAGNOSIS — Z87440 Personal history of urinary (tract) infections: Secondary | ICD-10-CM | POA: Diagnosis not present

## 2017-05-16 DIAGNOSIS — I429 Cardiomyopathy, unspecified: Secondary | ICD-10-CM | POA: Diagnosis not present

## 2017-05-16 DIAGNOSIS — D631 Anemia in chronic kidney disease: Secondary | ICD-10-CM | POA: Diagnosis not present

## 2017-05-16 DIAGNOSIS — L89314 Pressure ulcer of right buttock, stage 4: Secondary | ICD-10-CM | POA: Diagnosis not present

## 2017-05-16 DIAGNOSIS — I132 Hypertensive heart and chronic kidney disease with heart failure and with stage 5 chronic kidney disease, or end stage renal disease: Secondary | ICD-10-CM | POA: Diagnosis not present

## 2017-05-16 DIAGNOSIS — Z992 Dependence on renal dialysis: Secondary | ICD-10-CM | POA: Diagnosis not present

## 2017-05-16 DIAGNOSIS — G35 Multiple sclerosis: Secondary | ICD-10-CM | POA: Diagnosis not present

## 2017-05-17 DIAGNOSIS — E1122 Type 2 diabetes mellitus with diabetic chronic kidney disease: Secondary | ICD-10-CM | POA: Diagnosis not present

## 2017-05-17 DIAGNOSIS — I5042 Chronic combined systolic (congestive) and diastolic (congestive) heart failure: Secondary | ICD-10-CM | POA: Diagnosis not present

## 2017-05-17 DIAGNOSIS — N186 End stage renal disease: Secondary | ICD-10-CM | POA: Diagnosis not present

## 2017-05-17 DIAGNOSIS — L89314 Pressure ulcer of right buttock, stage 4: Secondary | ICD-10-CM | POA: Diagnosis not present

## 2017-05-17 DIAGNOSIS — G35 Multiple sclerosis: Secondary | ICD-10-CM | POA: Diagnosis not present

## 2017-05-17 DIAGNOSIS — I132 Hypertensive heart and chronic kidney disease with heart failure and with stage 5 chronic kidney disease, or end stage renal disease: Secondary | ICD-10-CM | POA: Diagnosis not present

## 2017-05-18 DIAGNOSIS — Z992 Dependence on renal dialysis: Secondary | ICD-10-CM | POA: Diagnosis not present

## 2017-05-18 DIAGNOSIS — D631 Anemia in chronic kidney disease: Secondary | ICD-10-CM | POA: Diagnosis not present

## 2017-05-18 DIAGNOSIS — E1122 Type 2 diabetes mellitus with diabetic chronic kidney disease: Secondary | ICD-10-CM | POA: Diagnosis not present

## 2017-05-18 DIAGNOSIS — N2581 Secondary hyperparathyroidism of renal origin: Secondary | ICD-10-CM | POA: Diagnosis not present

## 2017-05-18 DIAGNOSIS — N186 End stage renal disease: Secondary | ICD-10-CM | POA: Diagnosis not present

## 2017-05-18 DIAGNOSIS — Z23 Encounter for immunization: Secondary | ICD-10-CM | POA: Diagnosis not present

## 2017-05-19 DIAGNOSIS — N186 End stage renal disease: Secondary | ICD-10-CM | POA: Diagnosis not present

## 2017-05-19 DIAGNOSIS — I132 Hypertensive heart and chronic kidney disease with heart failure and with stage 5 chronic kidney disease, or end stage renal disease: Secondary | ICD-10-CM | POA: Diagnosis not present

## 2017-05-19 DIAGNOSIS — L89314 Pressure ulcer of right buttock, stage 4: Secondary | ICD-10-CM | POA: Diagnosis not present

## 2017-05-19 DIAGNOSIS — I5042 Chronic combined systolic (congestive) and diastolic (congestive) heart failure: Secondary | ICD-10-CM | POA: Diagnosis not present

## 2017-05-19 DIAGNOSIS — E1122 Type 2 diabetes mellitus with diabetic chronic kidney disease: Secondary | ICD-10-CM | POA: Diagnosis not present

## 2017-05-19 DIAGNOSIS — G35 Multiple sclerosis: Secondary | ICD-10-CM | POA: Diagnosis not present

## 2017-05-20 DIAGNOSIS — Z992 Dependence on renal dialysis: Secondary | ICD-10-CM | POA: Diagnosis not present

## 2017-05-20 DIAGNOSIS — N2581 Secondary hyperparathyroidism of renal origin: Secondary | ICD-10-CM | POA: Diagnosis not present

## 2017-05-20 DIAGNOSIS — D631 Anemia in chronic kidney disease: Secondary | ICD-10-CM | POA: Diagnosis not present

## 2017-05-20 DIAGNOSIS — Z23 Encounter for immunization: Secondary | ICD-10-CM | POA: Diagnosis not present

## 2017-05-20 DIAGNOSIS — E1122 Type 2 diabetes mellitus with diabetic chronic kidney disease: Secondary | ICD-10-CM | POA: Diagnosis not present

## 2017-05-20 DIAGNOSIS — N186 End stage renal disease: Secondary | ICD-10-CM | POA: Diagnosis not present

## 2017-05-21 DIAGNOSIS — G35 Multiple sclerosis: Secondary | ICD-10-CM | POA: Diagnosis not present

## 2017-05-21 DIAGNOSIS — T82858A Stenosis of vascular prosthetic devices, implants and grafts, initial encounter: Secondary | ICD-10-CM | POA: Diagnosis not present

## 2017-05-21 DIAGNOSIS — N186 End stage renal disease: Secondary | ICD-10-CM | POA: Diagnosis not present

## 2017-05-21 DIAGNOSIS — E1122 Type 2 diabetes mellitus with diabetic chronic kidney disease: Secondary | ICD-10-CM | POA: Diagnosis not present

## 2017-05-21 DIAGNOSIS — I771 Stricture of artery: Secondary | ICD-10-CM | POA: Diagnosis not present

## 2017-05-21 DIAGNOSIS — I5042 Chronic combined systolic (congestive) and diastolic (congestive) heart failure: Secondary | ICD-10-CM | POA: Diagnosis not present

## 2017-05-21 DIAGNOSIS — L89314 Pressure ulcer of right buttock, stage 4: Secondary | ICD-10-CM | POA: Diagnosis not present

## 2017-05-21 DIAGNOSIS — Z992 Dependence on renal dialysis: Secondary | ICD-10-CM | POA: Diagnosis not present

## 2017-05-21 DIAGNOSIS — I132 Hypertensive heart and chronic kidney disease with heart failure and with stage 5 chronic kidney disease, or end stage renal disease: Secondary | ICD-10-CM | POA: Diagnosis not present

## 2017-05-22 DIAGNOSIS — D631 Anemia in chronic kidney disease: Secondary | ICD-10-CM | POA: Diagnosis not present

## 2017-05-22 DIAGNOSIS — N2581 Secondary hyperparathyroidism of renal origin: Secondary | ICD-10-CM | POA: Diagnosis not present

## 2017-05-22 DIAGNOSIS — Z23 Encounter for immunization: Secondary | ICD-10-CM | POA: Diagnosis not present

## 2017-05-22 DIAGNOSIS — Z992 Dependence on renal dialysis: Secondary | ICD-10-CM | POA: Diagnosis not present

## 2017-05-22 DIAGNOSIS — E1122 Type 2 diabetes mellitus with diabetic chronic kidney disease: Secondary | ICD-10-CM | POA: Diagnosis not present

## 2017-05-22 DIAGNOSIS — N186 End stage renal disease: Secondary | ICD-10-CM | POA: Diagnosis not present

## 2017-05-24 DIAGNOSIS — N186 End stage renal disease: Secondary | ICD-10-CM | POA: Diagnosis not present

## 2017-05-24 DIAGNOSIS — I5042 Chronic combined systolic (congestive) and diastolic (congestive) heart failure: Secondary | ICD-10-CM | POA: Diagnosis not present

## 2017-05-24 DIAGNOSIS — E1122 Type 2 diabetes mellitus with diabetic chronic kidney disease: Secondary | ICD-10-CM | POA: Diagnosis not present

## 2017-05-24 DIAGNOSIS — I132 Hypertensive heart and chronic kidney disease with heart failure and with stage 5 chronic kidney disease, or end stage renal disease: Secondary | ICD-10-CM | POA: Diagnosis not present

## 2017-05-24 DIAGNOSIS — L89314 Pressure ulcer of right buttock, stage 4: Secondary | ICD-10-CM | POA: Diagnosis not present

## 2017-05-24 DIAGNOSIS — G35 Multiple sclerosis: Secondary | ICD-10-CM | POA: Diagnosis not present

## 2017-05-25 DIAGNOSIS — N186 End stage renal disease: Secondary | ICD-10-CM | POA: Diagnosis not present

## 2017-05-25 DIAGNOSIS — D631 Anemia in chronic kidney disease: Secondary | ICD-10-CM | POA: Diagnosis not present

## 2017-05-25 DIAGNOSIS — N2581 Secondary hyperparathyroidism of renal origin: Secondary | ICD-10-CM | POA: Diagnosis not present

## 2017-05-25 DIAGNOSIS — Z23 Encounter for immunization: Secondary | ICD-10-CM | POA: Diagnosis not present

## 2017-05-25 DIAGNOSIS — E1122 Type 2 diabetes mellitus with diabetic chronic kidney disease: Secondary | ICD-10-CM | POA: Diagnosis not present

## 2017-05-25 DIAGNOSIS — Z992 Dependence on renal dialysis: Secondary | ICD-10-CM | POA: Diagnosis not present

## 2017-05-26 DIAGNOSIS — G35 Multiple sclerosis: Secondary | ICD-10-CM | POA: Diagnosis not present

## 2017-05-26 DIAGNOSIS — L89314 Pressure ulcer of right buttock, stage 4: Secondary | ICD-10-CM | POA: Diagnosis not present

## 2017-05-26 DIAGNOSIS — E1122 Type 2 diabetes mellitus with diabetic chronic kidney disease: Secondary | ICD-10-CM | POA: Diagnosis not present

## 2017-05-26 DIAGNOSIS — I5042 Chronic combined systolic (congestive) and diastolic (congestive) heart failure: Secondary | ICD-10-CM | POA: Diagnosis not present

## 2017-05-26 DIAGNOSIS — N186 End stage renal disease: Secondary | ICD-10-CM | POA: Diagnosis not present

## 2017-05-26 DIAGNOSIS — I132 Hypertensive heart and chronic kidney disease with heart failure and with stage 5 chronic kidney disease, or end stage renal disease: Secondary | ICD-10-CM | POA: Diagnosis not present

## 2017-05-27 DIAGNOSIS — N186 End stage renal disease: Secondary | ICD-10-CM | POA: Diagnosis not present

## 2017-05-27 DIAGNOSIS — D631 Anemia in chronic kidney disease: Secondary | ICD-10-CM | POA: Diagnosis not present

## 2017-05-27 DIAGNOSIS — Z992 Dependence on renal dialysis: Secondary | ICD-10-CM | POA: Diagnosis not present

## 2017-05-27 DIAGNOSIS — Z23 Encounter for immunization: Secondary | ICD-10-CM | POA: Diagnosis not present

## 2017-05-27 DIAGNOSIS — N2581 Secondary hyperparathyroidism of renal origin: Secondary | ICD-10-CM | POA: Diagnosis not present

## 2017-05-27 DIAGNOSIS — E1122 Type 2 diabetes mellitus with diabetic chronic kidney disease: Secondary | ICD-10-CM | POA: Diagnosis not present

## 2017-05-28 DIAGNOSIS — E1122 Type 2 diabetes mellitus with diabetic chronic kidney disease: Secondary | ICD-10-CM | POA: Diagnosis not present

## 2017-05-28 DIAGNOSIS — L89314 Pressure ulcer of right buttock, stage 4: Secondary | ICD-10-CM | POA: Diagnosis not present

## 2017-05-28 DIAGNOSIS — I132 Hypertensive heart and chronic kidney disease with heart failure and with stage 5 chronic kidney disease, or end stage renal disease: Secondary | ICD-10-CM | POA: Diagnosis not present

## 2017-05-28 DIAGNOSIS — G35 Multiple sclerosis: Secondary | ICD-10-CM | POA: Diagnosis not present

## 2017-05-28 DIAGNOSIS — I5042 Chronic combined systolic (congestive) and diastolic (congestive) heart failure: Secondary | ICD-10-CM | POA: Diagnosis not present

## 2017-05-28 DIAGNOSIS — N186 End stage renal disease: Secondary | ICD-10-CM | POA: Diagnosis not present

## 2017-05-29 DIAGNOSIS — D631 Anemia in chronic kidney disease: Secondary | ICD-10-CM | POA: Diagnosis not present

## 2017-05-29 DIAGNOSIS — E1122 Type 2 diabetes mellitus with diabetic chronic kidney disease: Secondary | ICD-10-CM | POA: Diagnosis not present

## 2017-05-29 DIAGNOSIS — N186 End stage renal disease: Secondary | ICD-10-CM | POA: Diagnosis not present

## 2017-05-29 DIAGNOSIS — N2581 Secondary hyperparathyroidism of renal origin: Secondary | ICD-10-CM | POA: Diagnosis not present

## 2017-05-29 DIAGNOSIS — Z992 Dependence on renal dialysis: Secondary | ICD-10-CM | POA: Diagnosis not present

## 2017-05-29 DIAGNOSIS — Z23 Encounter for immunization: Secondary | ICD-10-CM | POA: Diagnosis not present

## 2017-05-31 DIAGNOSIS — N319 Neuromuscular dysfunction of bladder, unspecified: Secondary | ICD-10-CM | POA: Diagnosis not present

## 2017-05-31 DIAGNOSIS — I5042 Chronic combined systolic (congestive) and diastolic (congestive) heart failure: Secondary | ICD-10-CM | POA: Diagnosis not present

## 2017-05-31 DIAGNOSIS — N186 End stage renal disease: Secondary | ICD-10-CM | POA: Diagnosis not present

## 2017-05-31 DIAGNOSIS — N281 Cyst of kidney, acquired: Secondary | ICD-10-CM | POA: Diagnosis not present

## 2017-05-31 DIAGNOSIS — G35 Multiple sclerosis: Secondary | ICD-10-CM | POA: Diagnosis not present

## 2017-05-31 DIAGNOSIS — Q541 Hypospadias, penile: Secondary | ICD-10-CM | POA: Diagnosis not present

## 2017-05-31 DIAGNOSIS — I132 Hypertensive heart and chronic kidney disease with heart failure and with stage 5 chronic kidney disease, or end stage renal disease: Secondary | ICD-10-CM | POA: Diagnosis not present

## 2017-05-31 DIAGNOSIS — L89314 Pressure ulcer of right buttock, stage 4: Secondary | ICD-10-CM | POA: Diagnosis not present

## 2017-05-31 DIAGNOSIS — E1122 Type 2 diabetes mellitus with diabetic chronic kidney disease: Secondary | ICD-10-CM | POA: Diagnosis not present

## 2017-05-31 DIAGNOSIS — N200 Calculus of kidney: Secondary | ICD-10-CM | POA: Diagnosis not present

## 2017-06-02 DIAGNOSIS — N186 End stage renal disease: Secondary | ICD-10-CM | POA: Diagnosis not present

## 2017-06-02 DIAGNOSIS — I132 Hypertensive heart and chronic kidney disease with heart failure and with stage 5 chronic kidney disease, or end stage renal disease: Secondary | ICD-10-CM | POA: Diagnosis not present

## 2017-06-02 DIAGNOSIS — I5042 Chronic combined systolic (congestive) and diastolic (congestive) heart failure: Secondary | ICD-10-CM | POA: Diagnosis not present

## 2017-06-02 DIAGNOSIS — E1122 Type 2 diabetes mellitus with diabetic chronic kidney disease: Secondary | ICD-10-CM | POA: Diagnosis not present

## 2017-06-02 DIAGNOSIS — G35 Multiple sclerosis: Secondary | ICD-10-CM | POA: Diagnosis not present

## 2017-06-02 DIAGNOSIS — L89314 Pressure ulcer of right buttock, stage 4: Secondary | ICD-10-CM | POA: Diagnosis not present

## 2017-06-03 DIAGNOSIS — Z992 Dependence on renal dialysis: Secondary | ICD-10-CM | POA: Diagnosis not present

## 2017-06-03 DIAGNOSIS — E1122 Type 2 diabetes mellitus with diabetic chronic kidney disease: Secondary | ICD-10-CM | POA: Diagnosis not present

## 2017-06-03 DIAGNOSIS — Z23 Encounter for immunization: Secondary | ICD-10-CM | POA: Diagnosis not present

## 2017-06-03 DIAGNOSIS — N2581 Secondary hyperparathyroidism of renal origin: Secondary | ICD-10-CM | POA: Diagnosis not present

## 2017-06-03 DIAGNOSIS — N186 End stage renal disease: Secondary | ICD-10-CM | POA: Diagnosis not present

## 2017-06-03 DIAGNOSIS — D631 Anemia in chronic kidney disease: Secondary | ICD-10-CM | POA: Diagnosis not present

## 2017-06-04 ENCOUNTER — Encounter (HOSPITAL_BASED_OUTPATIENT_CLINIC_OR_DEPARTMENT_OTHER): Payer: Medicare Other | Attending: Internal Medicine

## 2017-06-04 ENCOUNTER — Other Ambulatory Visit: Payer: Self-pay | Admitting: Nurse Practitioner

## 2017-06-04 DIAGNOSIS — G8220 Paraplegia, unspecified: Secondary | ICD-10-CM | POA: Insufficient documentation

## 2017-06-04 DIAGNOSIS — E1122 Type 2 diabetes mellitus with diabetic chronic kidney disease: Secondary | ICD-10-CM | POA: Insufficient documentation

## 2017-06-04 DIAGNOSIS — I132 Hypertensive heart and chronic kidney disease with heart failure and with stage 5 chronic kidney disease, or end stage renal disease: Secondary | ICD-10-CM | POA: Insufficient documentation

## 2017-06-04 DIAGNOSIS — N186 End stage renal disease: Secondary | ICD-10-CM | POA: Diagnosis not present

## 2017-06-04 DIAGNOSIS — D631 Anemia in chronic kidney disease: Secondary | ICD-10-CM | POA: Diagnosis not present

## 2017-06-04 DIAGNOSIS — Z992 Dependence on renal dialysis: Secondary | ICD-10-CM | POA: Diagnosis not present

## 2017-06-04 DIAGNOSIS — G35 Multiple sclerosis: Secondary | ICD-10-CM | POA: Diagnosis not present

## 2017-06-04 DIAGNOSIS — I509 Heart failure, unspecified: Secondary | ICD-10-CM | POA: Diagnosis not present

## 2017-06-04 DIAGNOSIS — N2581 Secondary hyperparathyroidism of renal origin: Secondary | ICD-10-CM | POA: Diagnosis not present

## 2017-06-04 DIAGNOSIS — L89314 Pressure ulcer of right buttock, stage 4: Secondary | ICD-10-CM | POA: Insufficient documentation

## 2017-06-05 DIAGNOSIS — E1122 Type 2 diabetes mellitus with diabetic chronic kidney disease: Secondary | ICD-10-CM | POA: Diagnosis not present

## 2017-06-05 DIAGNOSIS — Z992 Dependence on renal dialysis: Secondary | ICD-10-CM | POA: Diagnosis not present

## 2017-06-05 DIAGNOSIS — N2581 Secondary hyperparathyroidism of renal origin: Secondary | ICD-10-CM | POA: Diagnosis not present

## 2017-06-05 DIAGNOSIS — D631 Anemia in chronic kidney disease: Secondary | ICD-10-CM | POA: Diagnosis not present

## 2017-06-05 DIAGNOSIS — N186 End stage renal disease: Secondary | ICD-10-CM | POA: Diagnosis not present

## 2017-06-07 DIAGNOSIS — N186 End stage renal disease: Secondary | ICD-10-CM | POA: Diagnosis not present

## 2017-06-07 DIAGNOSIS — E1122 Type 2 diabetes mellitus with diabetic chronic kidney disease: Secondary | ICD-10-CM | POA: Diagnosis not present

## 2017-06-07 DIAGNOSIS — I5042 Chronic combined systolic (congestive) and diastolic (congestive) heart failure: Secondary | ICD-10-CM | POA: Diagnosis not present

## 2017-06-07 DIAGNOSIS — L89314 Pressure ulcer of right buttock, stage 4: Secondary | ICD-10-CM | POA: Diagnosis not present

## 2017-06-07 DIAGNOSIS — N360 Urethral fistula: Secondary | ICD-10-CM | POA: Diagnosis not present

## 2017-06-07 DIAGNOSIS — G35 Multiple sclerosis: Secondary | ICD-10-CM | POA: Diagnosis not present

## 2017-06-07 DIAGNOSIS — I132 Hypertensive heart and chronic kidney disease with heart failure and with stage 5 chronic kidney disease, or end stage renal disease: Secondary | ICD-10-CM | POA: Diagnosis not present

## 2017-06-09 ENCOUNTER — Emergency Department (HOSPITAL_COMMUNITY): Payer: Medicare Other

## 2017-06-09 ENCOUNTER — Emergency Department (HOSPITAL_COMMUNITY)
Admission: EM | Admit: 2017-06-09 | Discharge: 2017-06-09 | Disposition: A | Discharge status: Home / Self Care | Payer: Medicare Other | Attending: Emergency Medicine | Admitting: Emergency Medicine

## 2017-06-09 ENCOUNTER — Other Ambulatory Visit: Payer: Self-pay

## 2017-06-09 ENCOUNTER — Encounter (HOSPITAL_COMMUNITY): Payer: Self-pay

## 2017-06-09 DIAGNOSIS — Z992 Dependence on renal dialysis: Secondary | ICD-10-CM | POA: Diagnosis not present

## 2017-06-09 DIAGNOSIS — E1122 Type 2 diabetes mellitus with diabetic chronic kidney disease: Secondary | ICD-10-CM | POA: Insufficient documentation

## 2017-06-09 DIAGNOSIS — Z79899 Other long term (current) drug therapy: Secondary | ICD-10-CM | POA: Diagnosis not present

## 2017-06-09 DIAGNOSIS — J90 Pleural effusion, not elsewhere classified: Secondary | ICD-10-CM | POA: Diagnosis not present

## 2017-06-09 DIAGNOSIS — L89314 Pressure ulcer of right buttock, stage 4: Secondary | ICD-10-CM | POA: Diagnosis not present

## 2017-06-09 DIAGNOSIS — G35 Multiple sclerosis: Secondary | ICD-10-CM | POA: Diagnosis not present

## 2017-06-09 DIAGNOSIS — I5042 Chronic combined systolic (congestive) and diastolic (congestive) heart failure: Secondary | ICD-10-CM | POA: Diagnosis not present

## 2017-06-09 DIAGNOSIS — I132 Hypertensive heart and chronic kidney disease with heart failure and with stage 5 chronic kidney disease, or end stage renal disease: Secondary | ICD-10-CM | POA: Diagnosis not present

## 2017-06-09 DIAGNOSIS — R61 Generalized hyperhidrosis: Secondary | ICD-10-CM | POA: Diagnosis not present

## 2017-06-09 DIAGNOSIS — F411 Generalized anxiety disorder: Secondary | ICD-10-CM | POA: Diagnosis not present

## 2017-06-09 DIAGNOSIS — B999 Unspecified infectious disease: Secondary | ICD-10-CM | POA: Diagnosis not present

## 2017-06-09 DIAGNOSIS — N3001 Acute cystitis with hematuria: Secondary | ICD-10-CM | POA: Diagnosis not present

## 2017-06-09 DIAGNOSIS — Z87891 Personal history of nicotine dependence: Secondary | ICD-10-CM | POA: Diagnosis not present

## 2017-06-09 DIAGNOSIS — N186 End stage renal disease: Secondary | ICD-10-CM | POA: Insufficient documentation

## 2017-06-09 DIAGNOSIS — Z794 Long term (current) use of insulin: Secondary | ICD-10-CM | POA: Diagnosis not present

## 2017-06-09 DIAGNOSIS — R509 Fever, unspecified: Secondary | ICD-10-CM | POA: Diagnosis not present

## 2017-06-09 DIAGNOSIS — I5021 Acute systolic (congestive) heart failure: Secondary | ICD-10-CM | POA: Diagnosis not present

## 2017-06-09 DIAGNOSIS — I10 Essential (primary) hypertension: Secondary | ICD-10-CM | POA: Diagnosis not present

## 2017-06-09 HISTORY — DX: Multiple sclerosis: G35

## 2017-06-09 LAB — URINALYSIS, ROUTINE W REFLEX MICROSCOPIC
BILIRUBIN URINE: NEGATIVE
Glucose, UA: 50 mg/dL — AB
KETONES UR: NEGATIVE mg/dL
NITRITE: NEGATIVE
Protein, ur: 100 mg/dL — AB
SPECIFIC GRAVITY, URINE: 1.008 (ref 1.005–1.030)
Squamous Epithelial / LPF: NONE SEEN
pH: 9 — ABNORMAL HIGH (ref 5.0–8.0)

## 2017-06-09 LAB — COMPREHENSIVE METABOLIC PANEL
ALK PHOS: 77 U/L (ref 38–126)
ALT: 14 U/L — ABNORMAL LOW (ref 17–63)
ANION GAP: 19 — AB (ref 5–15)
AST: 19 U/L (ref 15–41)
Albumin: 2.3 g/dL — ABNORMAL LOW (ref 3.5–5.0)
BILIRUBIN TOTAL: 0.9 mg/dL (ref 0.3–1.2)
BUN: 119 mg/dL — ABNORMAL HIGH (ref 6–20)
CALCIUM: 9.4 mg/dL (ref 8.9–10.3)
CO2: 19 mmol/L — ABNORMAL LOW (ref 22–32)
Chloride: 92 mmol/L — ABNORMAL LOW (ref 101–111)
Creatinine, Ser: 7.41 mg/dL — ABNORMAL HIGH (ref 0.61–1.24)
GFR calc non Af Amer: 6 mL/min — ABNORMAL LOW (ref >60)
GFR, EST AFRICAN AMERICAN: 7 mL/min — AB (ref >60)
Glucose, Bld: 218 mg/dL — ABNORMAL HIGH (ref 65–99)
POTASSIUM: 4.9 mmol/L (ref 3.5–5.1)
Sodium: 130 mmol/L — ABNORMAL LOW (ref 135–145)
Total Protein: 7.1 g/dL (ref 6.5–8.1)

## 2017-06-09 LAB — CBC WITH DIFFERENTIAL/PLATELET
BASOS ABS: 0 10*3/uL (ref 0.0–0.1)
BASOS PCT: 0 %
Eosinophils Absolute: 0.1 10*3/uL (ref 0.0–0.7)
Eosinophils Relative: 1 %
HEMATOCRIT: 30.9 % — AB (ref 39.0–52.0)
Hemoglobin: 10 g/dL — ABNORMAL LOW (ref 13.0–17.0)
LYMPHS PCT: 7 %
Lymphs Abs: 0.7 10*3/uL (ref 0.7–4.0)
MCH: 32.1 pg (ref 26.0–34.0)
MCHC: 32.4 g/dL (ref 30.0–36.0)
MCV: 99 fL (ref 78.0–100.0)
Monocytes Absolute: 1.3 10*3/uL — ABNORMAL HIGH (ref 0.1–1.0)
Monocytes Relative: 12 %
NEUTROS ABS: 8.4 10*3/uL — AB (ref 1.7–7.7)
NEUTROS PCT: 80 %
Platelets: 160 10*3/uL (ref 150–400)
RBC: 3.12 MIL/uL — AB (ref 4.22–5.81)
RDW: 17.4 % — AB (ref 11.5–15.5)
WBC: 10.5 10*3/uL (ref 4.0–10.5)

## 2017-06-09 LAB — I-STAT CG4 LACTIC ACID, ED: Lactic Acid, Venous: 0.77 mmol/L (ref 0.5–1.9)

## 2017-06-09 LAB — CBG MONITORING, ED
GLUCOSE-CAPILLARY: 217 mg/dL — AB (ref 65–99)
Glucose-Capillary: 150 mg/dL — ABNORMAL HIGH (ref 65–99)

## 2017-06-09 LAB — INFLUENZA PANEL BY PCR (TYPE A & B)
INFLAPCR: NEGATIVE
Influenza B By PCR: NEGATIVE

## 2017-06-09 MED ORDER — CEPHALEXIN 250 MG PO CAPS: 250.0000 mg | ORAL_CAPSULE | Freq: Two times a day (BID) | ORAL | 0 refills | Status: AC

## 2017-06-09 MED ORDER — DEXTROSE 5 % IV SOLN
1.0000 g | Freq: Once | INTRAVENOUS | Status: AC
  Administered 2017-06-09: 1 g via INTRAVENOUS
  Filled 2017-06-09: qty 10

## 2017-06-09 MED ORDER — SODIUM CHLORIDE 0.9 % IV BOLUS (SEPSIS): 500.0000 mL | Freq: Once | INTRAVENOUS | Status: DC

## 2017-06-09 MED ORDER — SODIUM CHLORIDE 0.9 % IV BOLUS (SEPSIS)
250.0000 mL | Freq: Once | INTRAVENOUS | Status: AC
  Administered 2017-06-09: 250 mL via INTRAVENOUS

## 2017-06-09 NOTE — ED Triage Notes (Signed)
Pt came in from home via [facility]  Began not feeling well following dialysis on Saturday, fever started yesterday, highest temp has been 102 at 0900 today Missed dialysis yesterday due to not feeling well, fatigue unusual for him. Today, has missed daily long-acting insulin and breakfast dose of Novolog. Pt is parapalygic with indwelling catheter that he changes once a month, bag changed every two weeks- changed catheter last on 1/15 and bag on 2/5.

## 2017-06-09 NOTE — ED Notes (Signed)
[facility] contacted for tx back home 

## 2017-06-09 NOTE — ED Provider Notes (Signed)
[facility] EMERGENCY DEPARTMENT Provider Note   CSN: [id number] Arrival date & time: 06/09/17  1116     History   Chief Complaint Chief Complaint  Patient presents with  . Fever    HPI Lance [name] is a 77 y.o. male who presents with a fever. PMH significant for CHF, ESRD on dialysis (Tu/Th/Sat - last dialyzed Sat), chronic decubitus ulcer with wound VAC, chronic spastic paraplegia secondary to MS and neurogenic bladder with chronic indwelling Foley catheter. He states that he started to have fever, chills, sweats for the past several days. He was feeling so bad that he didn't go to dialysis yesterday. Today he is feeling a little better but his home health nurse was concerned he may have a UTI so he decided to come to the ED. He is unsure when he has a UTI or not because of his paraplegia. The catheter was last changed on Jan 15th. He states that he had an issue with it a couple days ago because the catheter was kinked and when it was unkinked, a lot of urine flowed out so he is unsure if the obstruction may have caused a UTI. He has also had mild URI symptoms - he's had a runny nose, scratchy throat, and dry cough. Additionally this morning he's had loose stools which is abnormal for him. He denies headache, chest pain, SOB, abdominal pain, N/V. He has had a flu shot this year. EMS noted a temp of 100.    HPI  Past Medical History:  Diagnosis Date  . Acute CHF (congestive heart failure) ([place]) 02/14/2016  . Acute osteomyelitis, pelvis, right ([place])   . Acute renal failure ([place])   . Anemia in chronic renal disease   . Blood transfusion   . Cardiomyopathy ([place]) 02/17/2016  . Chronic spastic paraplegia    secondary to MS  . Decreased sensation of lower extremity    due to MS  . Decubitus ulcer of ischium   . Decubitus ulcer of trochanter, stage 4 ([place]) 06/14/2015  . ESRD (end stage renal disease) ([place])    [place]  . History of kidney  stones    x2  . History of MRSA infection    urine  . History of recurrent UTIs   . Hypertension   . MS (multiple sclerosis) ([place])   . Multiple sclerosis ([place])    stated by client in triage   . Neuralgia neuritis, sciatic nerve    MS for 30 years  . Neurogenic bladder   . PONV (postoperative nausea and vomiting)   . Presence of indwelling urinary catheter   . Self-catheterizes urinary bladder   . Type 2 diabetes mellitus [facility])     Patient Active Problem List   Diagnosis Date Noted  . Moderate protein-calorie malnutrition ([place]) 03/19/2017  . Sepsis secondary to UTI ([place]) 03/18/2017  . Sepsis ([place]) 03/18/2017  . Partial nontraumatic amputation of foot, left ([place]) 01/27/2017  . Malnutrition of moderate degree 07/03/2016  . Symptomatic anemia 07/02/2016  . SIRS (systemic inflammatory response syndrome) ([place]) 05/09/2016  . Melena 05/09/2016  . Hematemesis 05/09/2016  . Non-intractable vomiting with nausea 05/07/2016  . Altered mental status 04/21/2016  . Acute respiratory failure ([place])   . ESRD on dialysis ([place])   . NICM (nonischemic cardiomyopathy) ([place])   . Impaired functional mobility, balance, and endurance 02/17/2016  . Generalized anxiety disorder 02/17/2016  . Cardiomyopathy ([place]) 02/17/2016  . Hypoxia   . Acute pulmonary edema (  [place])   . Elevated troponin 02/14/2016  . Acute systolic heart failure ([place]) 02/14/2016  . Depression 02/13/2016  . DM (diabetes mellitus) type II controlled with renal manifestation ([place]) 09/06/2015  . Dyslipidemia associated with type 2 diabetes mellitus ([place]) 09/06/2015  . Recurrent UTI (urinary tract infection) 09/06/2015  . Decubitus ulcer of right ischial area 06/14/2015  . Decubitus ulcer of trochanter, stage 4 ([place]) 06/14/2015  . Left perineal ischial pressure ulcer   . Acute osteomyelitis, pelvis, right ([place])   . Essential hypertension, benign 08/03/2013  . Spastic quadriplegia ([place]) 07/10/2011  . Multiple sclerosis ([place])  03/30/2011  . Sacral decubitus ulcer 03/30/2011  . Progressive anemia 03/30/2011    Past Surgical History:  Procedure Laterality Date  . AMPUTATION Left 01/22/2017   Procedure: Left 5th Ray Amputation;  Surgeon: Newt Minion, MD;  Location: [facility];  Service: Orthopedics;  Laterality: Left;  . APPLICATION OF A-CELL OF EXTREMITY Left 09/05/2014   Procedure: PLACEMENT OF ACELL AND VAC;  Surgeon: Theodoro Kos, DO;  Location: [facility];  Service: Plastics;  Laterality: Left;  . BASCILIC VEIN TRANSPOSITION Right 03/24/2016   Procedure: RADIAL- CEPHALIC FISTULA CREATION RIGHT ARM;  Surgeon: Conrad Groesbeck, MD;  Location: [facility];  Service: Vascular;  Laterality: Right;  . Buttocks flap Left   . COLONOSCOPY    . EXTRACORPOREAL SHOCK WAVE LITHOTRIPSY  03/23/2011   x2  . FINGER SURGERY  2004  . hip flap Right   . INCISION AND DRAINAGE OF WOUND Left 09/05/2014   Procedure: IRRIGATION AND DEBRIDEMENT OF LEFT ISCHIUM WOUND WITH ;  Surgeon: Theodoro Kos, DO;  Location: [facility];  Service: Plastics;  Laterality: Left;  . INSERTION OF DIALYSIS CATHETER N/A 02/27/2016   Procedure: INSERTION OF DIALYSIS CATHETER-RIGHT INTERNAL JUGULA PLACEMENT;  Surgeon: Conrad [place], MD;  Location: [place] [facility];  Service: Vascular;  Laterality: N/A;  . TONSILLECTOMY    . WOUND DEBRIDEMENT     [facility]        Home Medications    Prior to Admission medications   Medication Sig Start Date End Date Taking? Authorizing Provider  baclofen (LIORESAL) 10 MG tablet Take 1 tablet (10 mg total) by mouth at bedtime as needed for muscle spasms. 07/15/16   Lauree Chandler, NP  carvedilol (COREG) 3.125 MG tablet Take 1 tablet (3.125 mg total) by mouth 2 (two) times daily with a meal. 07/15/16   Eubanks, Carlos American, NP  insulin aspart (NOVOLOG FLEXPEN) 100 UNIT/ML FlexPen Inject 0-9 Units into the skin 3 (three) times daily with meals. Per sliding scale Patient taking differently: Inject 7 Units 3 (three) times daily with meals into the skin.  Per sliding scale 07/20/16   Lauree Chandler, NP  insulin detemir (LEVEMIR) 100 UNIT/ML injection Inject 9 Units into the skin daily with breakfast.    [provider]  Insulin Pen Needle (PEN NEEDLES) 30G X 8 MM MISC Inject 1 each into the skin 4 (four) times daily. . 07/20/16   Lauree Chandler, NP  lidocaine-prilocaine (EMLA) cream Apply 1 application topically as needed. 07/24/16   Conrad [place]erton, MD  Melatonin 3 MG TABS Take 3 mg by mouth at bedtime.    [provider]  NYSTATIN powder Apply 1 g daily as needed topically. To affected areas As directed 03/08/17   [provider]  simvastatin (ZOCOR) 20 MG tablet Take 20 mg by mouth every evening.    [provider]  Skin Protectants, Misc. (EUCERIN) cream  Apply 1 application daily as needed topically for dry skin. For feet     [provider]  trimethoprim (TRIMPEX) 100 MG tablet Take 100 mg daily by mouth. 03/16/17   [provider]    Family History Family History  Problem Relation Age of Onset  . Stroke Father   . Diabetes Father   . Diabetes Paternal Grandmother   . Stroke Paternal Grandfather   . Heart disease Neg Hx   . Cancer Neg Hx     Social History Social History   Tobacco Use  . Smoking status: Former Smoker    Types: Cigars    Last attempt to quit: 05/06/1983    Years since quitting: 34.1  . Smokeless tobacco: Never Used  Substance Use Topics  . Alcohol use: No    Alcohol/week: 0.6 oz    Types: 1 Glasses of wine per week    Comment: pt has not had any since Dec 2017  . Drug use: No     Allergies   Patient has no known allergies.   Review of Systems Review of Systems  Constitutional: Positive for chills, diaphoresis and fever.  HENT: Positive for congestion, rhinorrhea and sore throat.   Respiratory: Positive for cough. Negative for shortness of breath.   Cardiovascular: Negative for chest pain.  Gastrointestinal: Positive for diarrhea. Negative for  abdominal pain, nausea and vomiting.  Genitourinary: Negative for decreased urine volume, difficulty urinating, dysuria and flank pain.  All other systems reviewed and are negative.    Physical Exam Updated Vital Signs BP (!) 115/58   Pulse 92   Temp 98.3 F (36.8 C) (Oral)   Resp (!) 28   Ht 6\' 4"  (1.93 m)   Wt 73.9 kg (163 lb)   SpO2 94%   BMI 19.84 kg/m   Physical Exam  Constitutional: He is oriented to person, place, and time. He appears well-developed and well-nourished. No distress.  Chronically ill appearing  HENT:  Head: Normocephalic and atraumatic.  Eyes: Conjunctivae are normal. Pupils are equal, round, and reactive to light. Right eye exhibits no discharge. Left eye exhibits no discharge. No scleral icterus.  Neck: Normal range of motion.  Cardiovascular: Normal rate and regular rhythm. Exam reveals no gallop and no friction rub.  No murmur heard. Pulmonary/Chest: Effort normal and breath sounds normal. No respiratory distress.  Abdominal: Soft. Bowel sounds are normal. He exhibits no distension. There is no tenderness.  Genitourinary:  Genitourinary Comments: Foley is draining cloudy urine with visible particles  Neurological: He is alert and oriented to person, place, and time.  Skin: Skin is warm and dry. There is erythema (Wound is erythematous without skin breakdown noted).  Psychiatric: He has a normal mood and affect. His behavior is normal.  Nursing note and vitals reviewed.    ED Treatments / Results  Labs (all labs ordered are listed, but only abnormal results are displayed) Labs Reviewed  COMPREHENSIVE METABOLIC PANEL - Abnormal; Notable for the following components:      Result Value   Sodium 130 (*)    Chloride 92 (*)    CO2 19 (*)    Glucose, Bld 218 (*)    BUN 119 (*)    Creatinine, Ser 7.41 (*)    Albumin 2.3 (*)    ALT 14 (*)    GFR calc non Af Amer 6 (*)    GFR calc Af Amer 7 (*)    Anion gap 19 (*)    All other components  within  normal limits  CBC WITH DIFFERENTIAL/PLATELET - Abnormal; Notable for the following components:   RBC 3.12 (*)    Hemoglobin 10.0 (*)    HCT 30.9 (*)    RDW 17.4 (*)    Neutro Abs 8.4 (*)    Monocytes Absolute 1.3 (*)    All other components within normal limits  CBG MONITORING, ED - Abnormal; Notable for the following components:   Glucose-Capillary 217 (*)    All other components within normal limits  URINE CULTURE  URINALYSIS, ROUTINE W REFLEX MICROSCOPIC  INFLUENZA PANEL BY PCR (TYPE A & B)  I-STAT CG4 LACTIC ACID, ED  I-STAT CG4 LACTIC ACID, ED    EKG  EKG Interpretation  Date/Time:  Wednesday June 09 2017 11:19:18 EST Ventricular Rate:  93 PR Interval:    QRS Duration: 104 QT Interval:  346 QTC Calculation: 431 R Axis:   71 Text Interpretation:  Sinus rhythm since last tracing no significant change Confirmed by Malvin Johns [phone]) on 06/09/2017 11:21:53 AM       Radiology Dg Chest 2 View  Result Date: 06/09/2017 CLINICAL DATA:  Fever.  Chronic renal failure EXAM: CHEST  2 VIEW COMPARISON:  March 18, 2017 FINDINGS: There is a small left pleural effusion. There is no edema or consolidation. Heart is upper normal in size with pulmonary vascularity within normal limits. No adenopathy. No bone lesions are appreciable. IMPRESSION: Small left pleural effusion. No edema or consolidation. Stable cardiac silhouette. Electronically Signed   By: Lowella Grip III M.D.   On: 06/09/2017 13:09    Procedures Procedures (including critical care time)  Medications Ordered in ED Medications - No data to display   Initial Impression / Assessment and Plan / ED Course  I have reviewed the triage vital signs and the nursing notes.  Pertinent labs & imaging results that were available during my care of the patient were reviewed by me and considered in my medical decision making (see chart for details).  77 year old male presents with a fever of unknown origin. Vitals have  been normal in the ED. CBC is remarkable for mild anemia. CMP is remarkable for hyponatremia (130), hypochloremia (92), hyperglycemia (218), elevated BUN/SCr from baseline (although missed dialysis), elevated anion gap (19). Lactic acid is normal. CXR shows small left pleural effusion. UA is pending at shift change. Shared visit with Dr. Tamera Punt. Patient care signed out to S Joy PA-C at shift change who will dispo accordingly.  Final Clinical Impressions(s) / ED Diagnoses   Final diagnoses:  Fever, unspecified fever cause    ED Discharge Orders    None       Recardo Evangelist, PA-C 06/09/17 1604    Malvin Johns, MD 06/10/17 [phone]

## 2017-06-09 NOTE — Discharge Instructions (Signed)
Please take all of your antibiotics until finished!   You may develop abdominal discomfort or diarrhea from the antibiotic.  You may help offset this with probiotics which you can buy or get in yogurt. Do not eat or take the probiotics until 2 hours after your antibiotic.   Tylenol and/or ibuprofen for fever.  Follow-up with the primary care provider or urologist on this matter. Return to the ED should symptoms worsen.

## 2017-06-09 NOTE — ED Provider Notes (Signed)
Lance [name] is a 77 y.o. male, with a history of MS, ESRD on dialysis, paraplegia, HTN, recurrent UTIs, and DM, presenting to the ED with a fever noted this morning.   HPI from Janetta Hora, PA-C: "Lance [name] is a 77 y.o. male who presents with a fever. PMH significant for CHF, ESRD on dialysis (Tu/Th/Sat - last dialyzed Sat), chronic decubitus ulcer with wound VAC, chronic spastic paraplegia secondary to MS and neurogenic bladder with chronic indwelling Foley catheter. He states that he started to have fever, chills, sweats for the past several days. He was feeling so bad that he didn't go to dialysis yesterday. Today he is feeling a little better but his home health nurse was concerned he may have a UTI so he decided to come to the ED. He is unsure when he has a UTI or not because of his paraplegia. The catheter was last changed on Jan 15th. He states that he had an issue with it a couple days ago because the catheter was kinked and when it was unkinked, a lot of urine flowed out so he is unsure if the obstruction may have caused a UTI. He has also had mild URI symptoms - he's had a runny nose, scratchy throat, and dry cough. Additionally this morning he's had loose stools which is abnormal for him. He denies headache, chest pain, SOB, abdominal pain, N/V. He has had a flu shot this year. EMS noted a temp of 100."   Past Medical History:  Diagnosis Date  . Acute CHF (congestive heart failure) ([place]) 02/14/2016  . Acute osteomyelitis, pelvis, right ([place])   . Acute renal failure ([place])   . Anemia in chronic renal disease   . Blood transfusion   . Cardiomyopathy ([place]) 02/17/2016  . Chronic spastic paraplegia    secondary to MS  . Decreased sensation of lower extremity    due to MS  . Decubitus ulcer of ischium   . Decubitus ulcer of trochanter, stage 4 ([place]) 06/14/2015  . ESRD (end stage renal disease) ([place])    [place]  . History of kidney stones    x2  .  History of MRSA infection    urine  . History of recurrent UTIs   . Hypertension   . MS (multiple sclerosis) ([place])   . Multiple sclerosis ([place])    stated by client in triage   . Neuralgia neuritis, sciatic nerve    MS for 30 years  . Neurogenic bladder   . PONV (postoperative nausea and vomiting)   . Presence of indwelling urinary catheter   . Self-catheterizes urinary bladder   . Type 2 diabetes mellitus ([facility])     Physical Exam  BP (!) 115/58   Pulse 92   Temp 98.3 F (36.8 C) (Oral)   Resp (!) 28   Ht 6\' 4"  (1.93 m)   Wt 73.9 kg (163 lb)   SpO2 94%   BMI 19.84 kg/m   Physical Exam  Constitutional: He appears well-developed and well-nourished. No distress.  HENT:  Head: Normocephalic and atraumatic.  Mouth/Throat: Oropharynx is clear and moist.  Eyes: Conjunctivae are normal.  Neck: Neck supple.  Cardiovascular: Normal rate, regular rhythm, normal heart sounds and intact distal pulses.  Pulmonary/Chest: Effort normal and breath sounds normal. No respiratory distress.  No increased work of breathing.  Patient speaks in full sentences without difficulty.  Abdominal: Soft. There is no tenderness. There is no guarding.  Musculoskeletal: He exhibits no  edema.  Lymphadenopathy:    He has no cervical adenopathy.  Neurological: He is alert.  Skin: Skin is warm and dry. He is not diaphoretic.  Psychiatric: He has a normal mood and affect. His behavior is normal.  Nursing note and vitals reviewed.   ED Course/Procedures   Clinical Course as of Jun 09 2045  Wed Jun 09, 2017  1559 Patient sodium noted to be lower than previous values, however, does not appear to be symptomatic to this at this time. Sodium: (!) 130 [SJ]    Clinical Course User Index [SJ] Trine Fread C, PA-C    Procedures   Abnormal Labs Reviewed  COMPREHENSIVE METABOLIC PANEL - Abnormal; Notable for the following components:      Result Value   Sodium 130 (*)    Chloride 92 (*)    CO2 19 (*)     Glucose, Bld 218 (*)    BUN 119 (*)    Creatinine, Ser 7.41 (*)    Albumin 2.3 (*)    ALT 14 (*)    GFR calc non Af Amer 6 (*)    GFR calc Af Amer 7 (*)    Anion gap 19 (*)    All other components within normal limits  CBC WITH DIFFERENTIAL/PLATELET - Abnormal; Notable for the following components:   RBC 3.12 (*)    Hemoglobin 10.0 (*)    HCT 30.9 (*)    RDW 17.4 (*)    Neutro Abs 8.4 (*)    Monocytes Absolute 1.3 (*)    All other components within normal limits  URINALYSIS, ROUTINE W REFLEX MICROSCOPIC - Abnormal; Notable for the following components:   APPearance CLOUDY (*)    pH 9.0 (*)    Glucose, UA 50 (*)    Hgb urine dipstick MODERATE (*)    Protein, ur 100 (*)    Leukocytes, UA LARGE (*)    Bacteria, UA MANY (*)    All other components within normal limits  CBG MONITORING, ED - Abnormal; Notable for the following components:   Glucose-Capillary 217 (*)    All other components within normal limits  CBG MONITORING, ED - Abnormal; Notable for the following components:   Glucose-Capillary 150 (*)    All other components within normal limits   Dg Chest 2 View  Result Date: 06/09/2017 CLINICAL DATA:  Fever.  Chronic renal failure EXAM: CHEST  2 VIEW COMPARISON:  March 18, 2017 FINDINGS: There is a small left pleural effusion. There is no edema or consolidation. Heart is upper normal in size with pulmonary vascularity within normal limits. No adenopathy. No bone lesions are appreciable. IMPRESSION: Small left pleural effusion. No edema or consolidation. Stable cardiac silhouette. Electronically Signed   By: Lowella Grip III M.D.   On: 06/09/2017 13:09    MDM    Took patient care handoff report from Janetta Hora, PA-C. Plan: Lab results are pending.  Rule out other sources of fever, such as influenza.  Patient presents with a fever.  Patient is nontoxic appearing, afebrile, not tachycardic, not tachypneic, not hypotensive, maintains excellent SPO2 on room air, and is  in no apparent distress.   Urine: He does have evidence of leukocytes and many bacteria in his urine.  Since he has a chronic indwelling Foley catheter, he is likely colonized, however, in light of his fever and the increase in bacterial load in the urine, I suspect this could be the source for his fever. Additional evidence for this is the  fact that the patient's Foley line was noted to be kinked prior to symptoms beginning.  His Foley catheter was changed here in the ED.  Lungs:No acute opacity noted on chest x-ray.  Wound: Patient's wound VAC bandaging was changed this morning and there is reportedly no evidence of infection at that time.  Patient had also seen his wound management physician a few days ago and no abnormalities were noted at that time, in fact the wound was noted to be showing signs of healing.  His other lab results are encouraging and do not suggest sepsis; no leukocytosis or lactic acidosis.  Influenza negative.  I have a low suspicion for fluid overload in this patient.  He has no increased work of breathing, complaint of shortness of breath, peripheral edema, orthopnea, or other suggestive abnormality.  Patient remained stable during ED course.  PCP versus urology follow-up. The patient was given instructions for home care as well as return precautions. Patient voices understanding of these instructions, accepts the plan, and is comfortable with discharge.  Findings and plan of care discussed with Antony Blackbird, MD.    Vitals:   06/09/17 1430 06/09/17 1445 06/09/17 1500 06/09/17 1515  BP: (!) 121/53 128/61 (!) 142/56 (!) 115/51  Pulse: 86 80 88 81  Resp: (!) 23 18 18 17   Temp:      TempSrc:      SpO2: 95% 97% 100% 98%  Weight:      Height:       Vitals:   06/09/17 1700 06/09/17 1715 06/09/17 1730 06/09/17 1745  BP: 130/61 128/64 (!) 123/59   Pulse: 88 (!) 133 87 86  Resp: (!) 24 (!) 23 (!) 22 (!) 22  Temp:      TempSrc:      SpO2: 100% (!) 87% 100% 100%   Weight:      Height:          Lorayne Bender, PA-C 06/09/17 2048    Tegeler, Gwenyth Allegra, MD 06/09/17 [phone]

## 2017-06-10 ENCOUNTER — Encounter: Payer: Self-pay | Admitting: Nephrology

## 2017-06-10 DIAGNOSIS — N2581 Secondary hyperparathyroidism of renal origin: Secondary | ICD-10-CM | POA: Diagnosis not present

## 2017-06-10 DIAGNOSIS — G35 Multiple sclerosis: Secondary | ICD-10-CM | POA: Diagnosis not present

## 2017-06-10 DIAGNOSIS — E1122 Type 2 diabetes mellitus with diabetic chronic kidney disease: Secondary | ICD-10-CM | POA: Diagnosis not present

## 2017-06-10 DIAGNOSIS — I132 Hypertensive heart and chronic kidney disease with heart failure and with stage 5 chronic kidney disease, or end stage renal disease: Secondary | ICD-10-CM | POA: Diagnosis not present

## 2017-06-10 DIAGNOSIS — D631 Anemia in chronic kidney disease: Secondary | ICD-10-CM | POA: Diagnosis not present

## 2017-06-10 DIAGNOSIS — L89314 Pressure ulcer of right buttock, stage 4: Secondary | ICD-10-CM | POA: Diagnosis not present

## 2017-06-10 DIAGNOSIS — N186 End stage renal disease: Secondary | ICD-10-CM | POA: Diagnosis not present

## 2017-06-10 DIAGNOSIS — I5042 Chronic combined systolic (congestive) and diastolic (congestive) heart failure: Secondary | ICD-10-CM | POA: Diagnosis not present

## 2017-06-10 DIAGNOSIS — Z992 Dependence on renal dialysis: Secondary | ICD-10-CM | POA: Diagnosis not present

## 2017-06-10 LAB — URINE CULTURE

## 2017-06-11 DIAGNOSIS — E1122 Type 2 diabetes mellitus with diabetic chronic kidney disease: Secondary | ICD-10-CM | POA: Diagnosis not present

## 2017-06-11 DIAGNOSIS — L89314 Pressure ulcer of right buttock, stage 4: Secondary | ICD-10-CM | POA: Diagnosis not present

## 2017-06-11 DIAGNOSIS — G35 Multiple sclerosis: Secondary | ICD-10-CM | POA: Diagnosis not present

## 2017-06-11 DIAGNOSIS — N186 End stage renal disease: Secondary | ICD-10-CM | POA: Diagnosis not present

## 2017-06-11 DIAGNOSIS — I5042 Chronic combined systolic (congestive) and diastolic (congestive) heart failure: Secondary | ICD-10-CM | POA: Diagnosis not present

## 2017-06-11 DIAGNOSIS — I132 Hypertensive heart and chronic kidney disease with heart failure and with stage 5 chronic kidney disease, or end stage renal disease: Secondary | ICD-10-CM | POA: Diagnosis not present

## 2017-06-12 DIAGNOSIS — N2581 Secondary hyperparathyroidism of renal origin: Secondary | ICD-10-CM | POA: Diagnosis not present

## 2017-06-12 DIAGNOSIS — Z992 Dependence on renal dialysis: Secondary | ICD-10-CM | POA: Diagnosis not present

## 2017-06-12 DIAGNOSIS — N186 End stage renal disease: Secondary | ICD-10-CM | POA: Diagnosis not present

## 2017-06-12 DIAGNOSIS — D631 Anemia in chronic kidney disease: Secondary | ICD-10-CM | POA: Diagnosis not present

## 2017-06-12 DIAGNOSIS — E1122 Type 2 diabetes mellitus with diabetic chronic kidney disease: Secondary | ICD-10-CM | POA: Diagnosis not present

## 2017-06-14 DIAGNOSIS — E1122 Type 2 diabetes mellitus with diabetic chronic kidney disease: Secondary | ICD-10-CM | POA: Diagnosis not present

## 2017-06-14 DIAGNOSIS — I132 Hypertensive heart and chronic kidney disease with heart failure and with stage 5 chronic kidney disease, or end stage renal disease: Secondary | ICD-10-CM | POA: Diagnosis not present

## 2017-06-14 DIAGNOSIS — L89314 Pressure ulcer of right buttock, stage 4: Secondary | ICD-10-CM | POA: Diagnosis not present

## 2017-06-14 DIAGNOSIS — N186 End stage renal disease: Secondary | ICD-10-CM | POA: Diagnosis not present

## 2017-06-14 DIAGNOSIS — G35 Multiple sclerosis: Secondary | ICD-10-CM | POA: Diagnosis not present

## 2017-06-14 DIAGNOSIS — I5042 Chronic combined systolic (congestive) and diastolic (congestive) heart failure: Secondary | ICD-10-CM | POA: Diagnosis not present

## 2017-06-15 DIAGNOSIS — N186 End stage renal disease: Secondary | ICD-10-CM | POA: Diagnosis not present

## 2017-06-15 DIAGNOSIS — Z992 Dependence on renal dialysis: Secondary | ICD-10-CM | POA: Diagnosis not present

## 2017-06-15 DIAGNOSIS — D631 Anemia in chronic kidney disease: Secondary | ICD-10-CM | POA: Diagnosis not present

## 2017-06-15 DIAGNOSIS — N2581 Secondary hyperparathyroidism of renal origin: Secondary | ICD-10-CM | POA: Diagnosis not present

## 2017-06-15 DIAGNOSIS — E1122 Type 2 diabetes mellitus with diabetic chronic kidney disease: Secondary | ICD-10-CM | POA: Diagnosis not present

## 2017-06-16 DIAGNOSIS — I5042 Chronic combined systolic (congestive) and diastolic (congestive) heart failure: Secondary | ICD-10-CM | POA: Diagnosis not present

## 2017-06-16 DIAGNOSIS — N186 End stage renal disease: Secondary | ICD-10-CM | POA: Diagnosis not present

## 2017-06-16 DIAGNOSIS — I132 Hypertensive heart and chronic kidney disease with heart failure and with stage 5 chronic kidney disease, or end stage renal disease: Secondary | ICD-10-CM | POA: Diagnosis not present

## 2017-06-16 DIAGNOSIS — E1122 Type 2 diabetes mellitus with diabetic chronic kidney disease: Secondary | ICD-10-CM | POA: Diagnosis not present

## 2017-06-16 DIAGNOSIS — G35 Multiple sclerosis: Secondary | ICD-10-CM | POA: Diagnosis not present

## 2017-06-16 DIAGNOSIS — L89314 Pressure ulcer of right buttock, stage 4: Secondary | ICD-10-CM | POA: Diagnosis not present

## 2017-06-17 DIAGNOSIS — D631 Anemia in chronic kidney disease: Secondary | ICD-10-CM | POA: Diagnosis not present

## 2017-06-17 DIAGNOSIS — N186 End stage renal disease: Secondary | ICD-10-CM | POA: Diagnosis not present

## 2017-06-17 DIAGNOSIS — N2581 Secondary hyperparathyroidism of renal origin: Secondary | ICD-10-CM | POA: Diagnosis not present

## 2017-06-17 DIAGNOSIS — Z992 Dependence on renal dialysis: Secondary | ICD-10-CM | POA: Diagnosis not present

## 2017-06-17 DIAGNOSIS — E1122 Type 2 diabetes mellitus with diabetic chronic kidney disease: Secondary | ICD-10-CM | POA: Diagnosis not present

## 2017-06-18 DIAGNOSIS — E119 Type 2 diabetes mellitus without complications: Secondary | ICD-10-CM | POA: Diagnosis not present

## 2017-06-18 DIAGNOSIS — N19 Unspecified kidney failure: Secondary | ICD-10-CM | POA: Diagnosis not present

## 2017-06-19 DIAGNOSIS — I5042 Chronic combined systolic (congestive) and diastolic (congestive) heart failure: Secondary | ICD-10-CM | POA: Diagnosis not present

## 2017-06-19 DIAGNOSIS — D631 Anemia in chronic kidney disease: Secondary | ICD-10-CM | POA: Diagnosis not present

## 2017-06-19 DIAGNOSIS — Z992 Dependence on renal dialysis: Secondary | ICD-10-CM | POA: Diagnosis not present

## 2017-06-19 DIAGNOSIS — E1122 Type 2 diabetes mellitus with diabetic chronic kidney disease: Secondary | ICD-10-CM | POA: Diagnosis not present

## 2017-06-19 DIAGNOSIS — L89314 Pressure ulcer of right buttock, stage 4: Secondary | ICD-10-CM | POA: Diagnosis not present

## 2017-06-19 DIAGNOSIS — N2581 Secondary hyperparathyroidism of renal origin: Secondary | ICD-10-CM | POA: Diagnosis not present

## 2017-06-19 DIAGNOSIS — N186 End stage renal disease: Secondary | ICD-10-CM | POA: Diagnosis not present

## 2017-06-19 DIAGNOSIS — G35 Multiple sclerosis: Secondary | ICD-10-CM | POA: Diagnosis not present

## 2017-06-19 DIAGNOSIS — I132 Hypertensive heart and chronic kidney disease with heart failure and with stage 5 chronic kidney disease, or end stage renal disease: Secondary | ICD-10-CM | POA: Diagnosis not present

## 2017-06-21 DIAGNOSIS — L89314 Pressure ulcer of right buttock, stage 4: Secondary | ICD-10-CM | POA: Diagnosis not present

## 2017-06-21 DIAGNOSIS — G35 Multiple sclerosis: Secondary | ICD-10-CM | POA: Diagnosis not present

## 2017-06-21 DIAGNOSIS — I5042 Chronic combined systolic (congestive) and diastolic (congestive) heart failure: Secondary | ICD-10-CM | POA: Diagnosis not present

## 2017-06-21 DIAGNOSIS — N186 End stage renal disease: Secondary | ICD-10-CM | POA: Diagnosis not present

## 2017-06-21 DIAGNOSIS — E1122 Type 2 diabetes mellitus with diabetic chronic kidney disease: Secondary | ICD-10-CM | POA: Diagnosis not present

## 2017-06-21 DIAGNOSIS — I132 Hypertensive heart and chronic kidney disease with heart failure and with stage 5 chronic kidney disease, or end stage renal disease: Secondary | ICD-10-CM | POA: Diagnosis not present

## 2017-06-22 DIAGNOSIS — D631 Anemia in chronic kidney disease: Secondary | ICD-10-CM | POA: Diagnosis not present

## 2017-06-22 DIAGNOSIS — N186 End stage renal disease: Secondary | ICD-10-CM | POA: Diagnosis not present

## 2017-06-22 DIAGNOSIS — E1122 Type 2 diabetes mellitus with diabetic chronic kidney disease: Secondary | ICD-10-CM | POA: Diagnosis not present

## 2017-06-22 DIAGNOSIS — Z992 Dependence on renal dialysis: Secondary | ICD-10-CM | POA: Diagnosis not present

## 2017-06-22 DIAGNOSIS — N2581 Secondary hyperparathyroidism of renal origin: Secondary | ICD-10-CM | POA: Diagnosis not present

## 2017-06-23 DIAGNOSIS — I5042 Chronic combined systolic (congestive) and diastolic (congestive) heart failure: Secondary | ICD-10-CM | POA: Diagnosis not present

## 2017-06-23 DIAGNOSIS — L89314 Pressure ulcer of right buttock, stage 4: Secondary | ICD-10-CM | POA: Diagnosis not present

## 2017-06-23 DIAGNOSIS — I132 Hypertensive heart and chronic kidney disease with heart failure and with stage 5 chronic kidney disease, or end stage renal disease: Secondary | ICD-10-CM | POA: Diagnosis not present

## 2017-06-23 DIAGNOSIS — E1122 Type 2 diabetes mellitus with diabetic chronic kidney disease: Secondary | ICD-10-CM | POA: Diagnosis not present

## 2017-06-23 DIAGNOSIS — N186 End stage renal disease: Secondary | ICD-10-CM | POA: Diagnosis not present

## 2017-06-23 DIAGNOSIS — G35 Multiple sclerosis: Secondary | ICD-10-CM | POA: Diagnosis not present

## 2017-06-24 DIAGNOSIS — N186 End stage renal disease: Secondary | ICD-10-CM | POA: Diagnosis not present

## 2017-06-24 DIAGNOSIS — N2581 Secondary hyperparathyroidism of renal origin: Secondary | ICD-10-CM | POA: Diagnosis not present

## 2017-06-24 DIAGNOSIS — E1122 Type 2 diabetes mellitus with diabetic chronic kidney disease: Secondary | ICD-10-CM | POA: Diagnosis not present

## 2017-06-24 DIAGNOSIS — D631 Anemia in chronic kidney disease: Secondary | ICD-10-CM | POA: Diagnosis not present

## 2017-06-24 DIAGNOSIS — Z992 Dependence on renal dialysis: Secondary | ICD-10-CM | POA: Diagnosis not present

## 2017-06-25 DIAGNOSIS — G35 Multiple sclerosis: Secondary | ICD-10-CM | POA: Diagnosis not present

## 2017-06-25 DIAGNOSIS — I132 Hypertensive heart and chronic kidney disease with heart failure and with stage 5 chronic kidney disease, or end stage renal disease: Secondary | ICD-10-CM | POA: Diagnosis not present

## 2017-06-25 DIAGNOSIS — N186 End stage renal disease: Secondary | ICD-10-CM | POA: Diagnosis not present

## 2017-06-25 DIAGNOSIS — E1122 Type 2 diabetes mellitus with diabetic chronic kidney disease: Secondary | ICD-10-CM | POA: Diagnosis not present

## 2017-06-25 DIAGNOSIS — L89314 Pressure ulcer of right buttock, stage 4: Secondary | ICD-10-CM | POA: Diagnosis not present

## 2017-06-25 DIAGNOSIS — I5042 Chronic combined systolic (congestive) and diastolic (congestive) heart failure: Secondary | ICD-10-CM | POA: Diagnosis not present

## 2017-06-26 DIAGNOSIS — E1122 Type 2 diabetes mellitus with diabetic chronic kidney disease: Secondary | ICD-10-CM | POA: Diagnosis not present

## 2017-06-26 DIAGNOSIS — D631 Anemia in chronic kidney disease: Secondary | ICD-10-CM | POA: Diagnosis not present

## 2017-06-26 DIAGNOSIS — Z992 Dependence on renal dialysis: Secondary | ICD-10-CM | POA: Diagnosis not present

## 2017-06-26 DIAGNOSIS — N2581 Secondary hyperparathyroidism of renal origin: Secondary | ICD-10-CM | POA: Diagnosis not present

## 2017-06-26 DIAGNOSIS — N186 End stage renal disease: Secondary | ICD-10-CM | POA: Diagnosis not present

## 2017-06-28 DIAGNOSIS — L89314 Pressure ulcer of right buttock, stage 4: Secondary | ICD-10-CM | POA: Diagnosis not present

## 2017-06-28 DIAGNOSIS — I5042 Chronic combined systolic (congestive) and diastolic (congestive) heart failure: Secondary | ICD-10-CM | POA: Diagnosis not present

## 2017-06-28 DIAGNOSIS — E1122 Type 2 diabetes mellitus with diabetic chronic kidney disease: Secondary | ICD-10-CM | POA: Diagnosis not present

## 2017-06-28 DIAGNOSIS — G35 Multiple sclerosis: Secondary | ICD-10-CM | POA: Diagnosis not present

## 2017-06-28 DIAGNOSIS — N186 End stage renal disease: Secondary | ICD-10-CM | POA: Diagnosis not present

## 2017-06-28 DIAGNOSIS — I132 Hypertensive heart and chronic kidney disease with heart failure and with stage 5 chronic kidney disease, or end stage renal disease: Secondary | ICD-10-CM | POA: Diagnosis not present

## 2017-06-29 DIAGNOSIS — D631 Anemia in chronic kidney disease: Secondary | ICD-10-CM | POA: Diagnosis not present

## 2017-06-29 DIAGNOSIS — N2581 Secondary hyperparathyroidism of renal origin: Secondary | ICD-10-CM | POA: Diagnosis not present

## 2017-06-29 DIAGNOSIS — E1122 Type 2 diabetes mellitus with diabetic chronic kidney disease: Secondary | ICD-10-CM | POA: Diagnosis not present

## 2017-06-29 DIAGNOSIS — Z992 Dependence on renal dialysis: Secondary | ICD-10-CM | POA: Diagnosis not present

## 2017-06-29 DIAGNOSIS — N186 End stage renal disease: Secondary | ICD-10-CM | POA: Diagnosis not present

## 2017-06-30 DIAGNOSIS — I132 Hypertensive heart and chronic kidney disease with heart failure and with stage 5 chronic kidney disease, or end stage renal disease: Secondary | ICD-10-CM | POA: Diagnosis not present

## 2017-06-30 DIAGNOSIS — I5042 Chronic combined systolic (congestive) and diastolic (congestive) heart failure: Secondary | ICD-10-CM | POA: Diagnosis not present

## 2017-06-30 DIAGNOSIS — G35 Multiple sclerosis: Secondary | ICD-10-CM | POA: Diagnosis not present

## 2017-06-30 DIAGNOSIS — N186 End stage renal disease: Secondary | ICD-10-CM | POA: Diagnosis not present

## 2017-06-30 DIAGNOSIS — L89314 Pressure ulcer of right buttock, stage 4: Secondary | ICD-10-CM | POA: Diagnosis not present

## 2017-06-30 DIAGNOSIS — E1122 Type 2 diabetes mellitus with diabetic chronic kidney disease: Secondary | ICD-10-CM | POA: Diagnosis not present

## 2017-07-01 DIAGNOSIS — N186 End stage renal disease: Secondary | ICD-10-CM | POA: Diagnosis not present

## 2017-07-01 DIAGNOSIS — Z992 Dependence on renal dialysis: Secondary | ICD-10-CM | POA: Diagnosis not present

## 2017-07-01 DIAGNOSIS — D631 Anemia in chronic kidney disease: Secondary | ICD-10-CM | POA: Diagnosis not present

## 2017-07-01 DIAGNOSIS — E1122 Type 2 diabetes mellitus with diabetic chronic kidney disease: Secondary | ICD-10-CM | POA: Diagnosis not present

## 2017-07-01 DIAGNOSIS — N2581 Secondary hyperparathyroidism of renal origin: Secondary | ICD-10-CM | POA: Diagnosis not present

## 2017-07-02 ENCOUNTER — Encounter (HOSPITAL_BASED_OUTPATIENT_CLINIC_OR_DEPARTMENT_OTHER): Payer: Medicare Other | Attending: Internal Medicine

## 2017-07-02 DIAGNOSIS — G35 Multiple sclerosis: Secondary | ICD-10-CM | POA: Diagnosis not present

## 2017-07-02 DIAGNOSIS — L89314 Pressure ulcer of right buttock, stage 4: Secondary | ICD-10-CM | POA: Insufficient documentation

## 2017-07-02 DIAGNOSIS — I509 Heart failure, unspecified: Secondary | ICD-10-CM | POA: Diagnosis not present

## 2017-07-02 DIAGNOSIS — I132 Hypertensive heart and chronic kidney disease with heart failure and with stage 5 chronic kidney disease, or end stage renal disease: Secondary | ICD-10-CM | POA: Insufficient documentation

## 2017-07-02 DIAGNOSIS — N186 End stage renal disease: Secondary | ICD-10-CM | POA: Insufficient documentation

## 2017-07-02 DIAGNOSIS — Z992 Dependence on renal dialysis: Secondary | ICD-10-CM | POA: Diagnosis not present

## 2017-07-02 DIAGNOSIS — E1122 Type 2 diabetes mellitus with diabetic chronic kidney disease: Secondary | ICD-10-CM | POA: Diagnosis not present

## 2017-07-02 DIAGNOSIS — D631 Anemia in chronic kidney disease: Secondary | ICD-10-CM | POA: Diagnosis not present

## 2017-07-02 DIAGNOSIS — N2581 Secondary hyperparathyroidism of renal origin: Secondary | ICD-10-CM | POA: Diagnosis not present

## 2017-07-02 DIAGNOSIS — Z23 Encounter for immunization: Secondary | ICD-10-CM | POA: Diagnosis not present

## 2017-07-03 DIAGNOSIS — Z992 Dependence on renal dialysis: Secondary | ICD-10-CM | POA: Diagnosis not present

## 2017-07-03 DIAGNOSIS — N186 End stage renal disease: Secondary | ICD-10-CM | POA: Diagnosis not present

## 2017-07-03 DIAGNOSIS — Z23 Encounter for immunization: Secondary | ICD-10-CM | POA: Diagnosis not present

## 2017-07-03 DIAGNOSIS — N2581 Secondary hyperparathyroidism of renal origin: Secondary | ICD-10-CM | POA: Diagnosis not present

## 2017-07-03 DIAGNOSIS — E1122 Type 2 diabetes mellitus with diabetic chronic kidney disease: Secondary | ICD-10-CM | POA: Diagnosis not present

## 2017-07-03 DIAGNOSIS — D631 Anemia in chronic kidney disease: Secondary | ICD-10-CM | POA: Diagnosis not present

## 2017-07-05 DIAGNOSIS — E1122 Type 2 diabetes mellitus with diabetic chronic kidney disease: Secondary | ICD-10-CM | POA: Diagnosis not present

## 2017-07-05 DIAGNOSIS — N186 End stage renal disease: Secondary | ICD-10-CM | POA: Diagnosis not present

## 2017-07-05 DIAGNOSIS — I5042 Chronic combined systolic (congestive) and diastolic (congestive) heart failure: Secondary | ICD-10-CM | POA: Diagnosis not present

## 2017-07-05 DIAGNOSIS — I132 Hypertensive heart and chronic kidney disease with heart failure and with stage 5 chronic kidney disease, or end stage renal disease: Secondary | ICD-10-CM | POA: Diagnosis not present

## 2017-07-05 DIAGNOSIS — G35 Multiple sclerosis: Secondary | ICD-10-CM | POA: Diagnosis not present

## 2017-07-05 DIAGNOSIS — L89314 Pressure ulcer of right buttock, stage 4: Secondary | ICD-10-CM | POA: Diagnosis not present

## 2017-07-06 DIAGNOSIS — D631 Anemia in chronic kidney disease: Secondary | ICD-10-CM | POA: Diagnosis not present

## 2017-07-06 DIAGNOSIS — Z992 Dependence on renal dialysis: Secondary | ICD-10-CM | POA: Diagnosis not present

## 2017-07-06 DIAGNOSIS — Z23 Encounter for immunization: Secondary | ICD-10-CM | POA: Diagnosis not present

## 2017-07-06 DIAGNOSIS — N2581 Secondary hyperparathyroidism of renal origin: Secondary | ICD-10-CM | POA: Diagnosis not present

## 2017-07-06 DIAGNOSIS — N186 End stage renal disease: Secondary | ICD-10-CM | POA: Diagnosis not present

## 2017-07-06 DIAGNOSIS — E1122 Type 2 diabetes mellitus with diabetic chronic kidney disease: Secondary | ICD-10-CM | POA: Diagnosis not present

## 2017-07-07 DIAGNOSIS — L89314 Pressure ulcer of right buttock, stage 4: Secondary | ICD-10-CM | POA: Diagnosis not present

## 2017-07-07 DIAGNOSIS — I5042 Chronic combined systolic (congestive) and diastolic (congestive) heart failure: Secondary | ICD-10-CM | POA: Diagnosis not present

## 2017-07-07 DIAGNOSIS — I132 Hypertensive heart and chronic kidney disease with heart failure and with stage 5 chronic kidney disease, or end stage renal disease: Secondary | ICD-10-CM | POA: Diagnosis not present

## 2017-07-07 DIAGNOSIS — N186 End stage renal disease: Secondary | ICD-10-CM | POA: Diagnosis not present

## 2017-07-07 DIAGNOSIS — E1122 Type 2 diabetes mellitus with diabetic chronic kidney disease: Secondary | ICD-10-CM | POA: Diagnosis not present

## 2017-07-07 DIAGNOSIS — G35 Multiple sclerosis: Secondary | ICD-10-CM | POA: Diagnosis not present

## 2017-07-08 DIAGNOSIS — Z992 Dependence on renal dialysis: Secondary | ICD-10-CM | POA: Diagnosis not present

## 2017-07-08 DIAGNOSIS — N186 End stage renal disease: Secondary | ICD-10-CM | POA: Diagnosis not present

## 2017-07-08 DIAGNOSIS — E1122 Type 2 diabetes mellitus with diabetic chronic kidney disease: Secondary | ICD-10-CM | POA: Diagnosis not present

## 2017-07-08 DIAGNOSIS — N2581 Secondary hyperparathyroidism of renal origin: Secondary | ICD-10-CM | POA: Diagnosis not present

## 2017-07-08 DIAGNOSIS — D631 Anemia in chronic kidney disease: Secondary | ICD-10-CM | POA: Diagnosis not present

## 2017-07-08 DIAGNOSIS — Z23 Encounter for immunization: Secondary | ICD-10-CM | POA: Diagnosis not present

## 2017-07-09 DIAGNOSIS — E1122 Type 2 diabetes mellitus with diabetic chronic kidney disease: Secondary | ICD-10-CM | POA: Diagnosis not present

## 2017-07-09 DIAGNOSIS — N186 End stage renal disease: Secondary | ICD-10-CM | POA: Diagnosis not present

## 2017-07-09 DIAGNOSIS — I132 Hypertensive heart and chronic kidney disease with heart failure and with stage 5 chronic kidney disease, or end stage renal disease: Secondary | ICD-10-CM | POA: Diagnosis not present

## 2017-07-09 DIAGNOSIS — I5042 Chronic combined systolic (congestive) and diastolic (congestive) heart failure: Secondary | ICD-10-CM | POA: Diagnosis not present

## 2017-07-09 DIAGNOSIS — L89314 Pressure ulcer of right buttock, stage 4: Secondary | ICD-10-CM | POA: Diagnosis not present

## 2017-07-09 DIAGNOSIS — G35 Multiple sclerosis: Secondary | ICD-10-CM | POA: Diagnosis not present

## 2017-07-10 DIAGNOSIS — D631 Anemia in chronic kidney disease: Secondary | ICD-10-CM | POA: Diagnosis not present

## 2017-07-10 DIAGNOSIS — Z992 Dependence on renal dialysis: Secondary | ICD-10-CM | POA: Diagnosis not present

## 2017-07-10 DIAGNOSIS — Z23 Encounter for immunization: Secondary | ICD-10-CM | POA: Diagnosis not present

## 2017-07-10 DIAGNOSIS — E1122 Type 2 diabetes mellitus with diabetic chronic kidney disease: Secondary | ICD-10-CM | POA: Diagnosis not present

## 2017-07-10 DIAGNOSIS — N186 End stage renal disease: Secondary | ICD-10-CM | POA: Diagnosis not present

## 2017-07-10 DIAGNOSIS — N2581 Secondary hyperparathyroidism of renal origin: Secondary | ICD-10-CM | POA: Diagnosis not present

## 2017-07-12 DIAGNOSIS — I5042 Chronic combined systolic (congestive) and diastolic (congestive) heart failure: Secondary | ICD-10-CM | POA: Diagnosis not present

## 2017-07-12 DIAGNOSIS — I132 Hypertensive heart and chronic kidney disease with heart failure and with stage 5 chronic kidney disease, or end stage renal disease: Secondary | ICD-10-CM | POA: Diagnosis not present

## 2017-07-12 DIAGNOSIS — L89314 Pressure ulcer of right buttock, stage 4: Secondary | ICD-10-CM | POA: Diagnosis not present

## 2017-07-12 DIAGNOSIS — G35 Multiple sclerosis: Secondary | ICD-10-CM | POA: Diagnosis not present

## 2017-07-12 DIAGNOSIS — E1122 Type 2 diabetes mellitus with diabetic chronic kidney disease: Secondary | ICD-10-CM | POA: Diagnosis not present

## 2017-07-12 DIAGNOSIS — N186 End stage renal disease: Secondary | ICD-10-CM | POA: Diagnosis not present

## 2017-07-13 DIAGNOSIS — Z992 Dependence on renal dialysis: Secondary | ICD-10-CM | POA: Diagnosis not present

## 2017-07-13 DIAGNOSIS — D631 Anemia in chronic kidney disease: Secondary | ICD-10-CM | POA: Diagnosis not present

## 2017-07-13 DIAGNOSIS — E1122 Type 2 diabetes mellitus with diabetic chronic kidney disease: Secondary | ICD-10-CM | POA: Diagnosis not present

## 2017-07-13 DIAGNOSIS — Z23 Encounter for immunization: Secondary | ICD-10-CM | POA: Diagnosis not present

## 2017-07-13 DIAGNOSIS — N2581 Secondary hyperparathyroidism of renal origin: Secondary | ICD-10-CM | POA: Diagnosis not present

## 2017-07-13 DIAGNOSIS — N186 End stage renal disease: Secondary | ICD-10-CM | POA: Diagnosis not present

## 2017-07-14 DIAGNOSIS — N186 End stage renal disease: Secondary | ICD-10-CM | POA: Diagnosis not present

## 2017-07-14 DIAGNOSIS — I5042 Chronic combined systolic (congestive) and diastolic (congestive) heart failure: Secondary | ICD-10-CM | POA: Diagnosis not present

## 2017-07-14 DIAGNOSIS — G35 Multiple sclerosis: Secondary | ICD-10-CM | POA: Diagnosis not present

## 2017-07-14 DIAGNOSIS — I132 Hypertensive heart and chronic kidney disease with heart failure and with stage 5 chronic kidney disease, or end stage renal disease: Secondary | ICD-10-CM | POA: Diagnosis not present

## 2017-07-14 DIAGNOSIS — L89314 Pressure ulcer of right buttock, stage 4: Secondary | ICD-10-CM | POA: Diagnosis not present

## 2017-07-14 DIAGNOSIS — E1122 Type 2 diabetes mellitus with diabetic chronic kidney disease: Secondary | ICD-10-CM | POA: Diagnosis not present

## 2017-07-15 DIAGNOSIS — N186 End stage renal disease: Secondary | ICD-10-CM | POA: Diagnosis not present

## 2017-07-15 DIAGNOSIS — D631 Anemia in chronic kidney disease: Secondary | ICD-10-CM | POA: Diagnosis not present

## 2017-07-15 DIAGNOSIS — N2581 Secondary hyperparathyroidism of renal origin: Secondary | ICD-10-CM | POA: Diagnosis not present

## 2017-07-15 DIAGNOSIS — Z992 Dependence on renal dialysis: Secondary | ICD-10-CM | POA: Diagnosis not present

## 2017-07-15 DIAGNOSIS — Z23 Encounter for immunization: Secondary | ICD-10-CM | POA: Diagnosis not present

## 2017-07-15 DIAGNOSIS — E1122 Type 2 diabetes mellitus with diabetic chronic kidney disease: Secondary | ICD-10-CM | POA: Diagnosis not present

## 2017-07-17 DIAGNOSIS — Z23 Encounter for immunization: Secondary | ICD-10-CM | POA: Diagnosis not present

## 2017-07-17 DIAGNOSIS — N2581 Secondary hyperparathyroidism of renal origin: Secondary | ICD-10-CM | POA: Diagnosis not present

## 2017-07-17 DIAGNOSIS — D631 Anemia in chronic kidney disease: Secondary | ICD-10-CM | POA: Diagnosis not present

## 2017-07-17 DIAGNOSIS — Z992 Dependence on renal dialysis: Secondary | ICD-10-CM | POA: Diagnosis not present

## 2017-07-17 DIAGNOSIS — E1122 Type 2 diabetes mellitus with diabetic chronic kidney disease: Secondary | ICD-10-CM | POA: Diagnosis not present

## 2017-07-17 DIAGNOSIS — N186 End stage renal disease: Secondary | ICD-10-CM | POA: Diagnosis not present

## 2017-07-20 DIAGNOSIS — D631 Anemia in chronic kidney disease: Secondary | ICD-10-CM | POA: Diagnosis not present

## 2017-07-20 DIAGNOSIS — N2581 Secondary hyperparathyroidism of renal origin: Secondary | ICD-10-CM | POA: Diagnosis not present

## 2017-07-20 DIAGNOSIS — Z23 Encounter for immunization: Secondary | ICD-10-CM | POA: Diagnosis not present

## 2017-07-20 DIAGNOSIS — E1122 Type 2 diabetes mellitus with diabetic chronic kidney disease: Secondary | ICD-10-CM | POA: Diagnosis not present

## 2017-07-20 DIAGNOSIS — Z992 Dependence on renal dialysis: Secondary | ICD-10-CM | POA: Diagnosis not present

## 2017-07-20 DIAGNOSIS — N186 End stage renal disease: Secondary | ICD-10-CM | POA: Diagnosis not present

## 2017-07-22 DIAGNOSIS — Z23 Encounter for immunization: Secondary | ICD-10-CM | POA: Diagnosis not present

## 2017-07-22 DIAGNOSIS — Z992 Dependence on renal dialysis: Secondary | ICD-10-CM | POA: Diagnosis not present

## 2017-07-22 DIAGNOSIS — D631 Anemia in chronic kidney disease: Secondary | ICD-10-CM | POA: Diagnosis not present

## 2017-07-22 DIAGNOSIS — N2581 Secondary hyperparathyroidism of renal origin: Secondary | ICD-10-CM | POA: Diagnosis not present

## 2017-07-22 DIAGNOSIS — E1122 Type 2 diabetes mellitus with diabetic chronic kidney disease: Secondary | ICD-10-CM | POA: Diagnosis not present

## 2017-07-22 DIAGNOSIS — N186 End stage renal disease: Secondary | ICD-10-CM | POA: Diagnosis not present

## 2017-07-24 DIAGNOSIS — N186 End stage renal disease: Secondary | ICD-10-CM | POA: Diagnosis not present

## 2017-07-24 DIAGNOSIS — Z23 Encounter for immunization: Secondary | ICD-10-CM | POA: Diagnosis not present

## 2017-07-24 DIAGNOSIS — Z992 Dependence on renal dialysis: Secondary | ICD-10-CM | POA: Diagnosis not present

## 2017-07-24 DIAGNOSIS — E1122 Type 2 diabetes mellitus with diabetic chronic kidney disease: Secondary | ICD-10-CM | POA: Diagnosis not present

## 2017-07-24 DIAGNOSIS — N2581 Secondary hyperparathyroidism of renal origin: Secondary | ICD-10-CM | POA: Diagnosis not present

## 2017-07-24 DIAGNOSIS — D631 Anemia in chronic kidney disease: Secondary | ICD-10-CM | POA: Diagnosis not present

## 2017-07-27 DIAGNOSIS — E1122 Type 2 diabetes mellitus with diabetic chronic kidney disease: Secondary | ICD-10-CM | POA: Diagnosis not present

## 2017-07-27 DIAGNOSIS — Z992 Dependence on renal dialysis: Secondary | ICD-10-CM | POA: Diagnosis not present

## 2017-07-27 DIAGNOSIS — Z23 Encounter for immunization: Secondary | ICD-10-CM | POA: Diagnosis not present

## 2017-07-27 DIAGNOSIS — N186 End stage renal disease: Secondary | ICD-10-CM | POA: Diagnosis not present

## 2017-07-27 DIAGNOSIS — D631 Anemia in chronic kidney disease: Secondary | ICD-10-CM | POA: Diagnosis not present

## 2017-07-27 DIAGNOSIS — N2581 Secondary hyperparathyroidism of renal origin: Secondary | ICD-10-CM | POA: Diagnosis not present

## 2017-07-29 DIAGNOSIS — Z23 Encounter for immunization: Secondary | ICD-10-CM | POA: Diagnosis not present

## 2017-07-29 DIAGNOSIS — E1122 Type 2 diabetes mellitus with diabetic chronic kidney disease: Secondary | ICD-10-CM | POA: Diagnosis not present

## 2017-07-29 DIAGNOSIS — N2581 Secondary hyperparathyroidism of renal origin: Secondary | ICD-10-CM | POA: Diagnosis not present

## 2017-07-29 DIAGNOSIS — D631 Anemia in chronic kidney disease: Secondary | ICD-10-CM | POA: Diagnosis not present

## 2017-07-29 DIAGNOSIS — Z992 Dependence on renal dialysis: Secondary | ICD-10-CM | POA: Diagnosis not present

## 2017-07-29 DIAGNOSIS — N186 End stage renal disease: Secondary | ICD-10-CM | POA: Diagnosis not present

## 2017-07-31 DIAGNOSIS — Z23 Encounter for immunization: Secondary | ICD-10-CM | POA: Diagnosis not present

## 2017-07-31 DIAGNOSIS — N186 End stage renal disease: Secondary | ICD-10-CM | POA: Diagnosis not present

## 2017-07-31 DIAGNOSIS — E1122 Type 2 diabetes mellitus with diabetic chronic kidney disease: Secondary | ICD-10-CM | POA: Diagnosis not present

## 2017-07-31 DIAGNOSIS — Z992 Dependence on renal dialysis: Secondary | ICD-10-CM | POA: Diagnosis not present

## 2017-07-31 DIAGNOSIS — N2581 Secondary hyperparathyroidism of renal origin: Secondary | ICD-10-CM | POA: Diagnosis not present

## 2017-07-31 DIAGNOSIS — D631 Anemia in chronic kidney disease: Secondary | ICD-10-CM | POA: Diagnosis not present

## 2017-08-02 ENCOUNTER — Encounter (HOSPITAL_BASED_OUTPATIENT_CLINIC_OR_DEPARTMENT_OTHER): Payer: Medicare Other | Attending: Internal Medicine

## 2017-08-02 DIAGNOSIS — G8220 Paraplegia, unspecified: Secondary | ICD-10-CM | POA: Insufficient documentation

## 2017-08-02 DIAGNOSIS — E1122 Type 2 diabetes mellitus with diabetic chronic kidney disease: Secondary | ICD-10-CM | POA: Diagnosis not present

## 2017-08-02 DIAGNOSIS — Z992 Dependence on renal dialysis: Secondary | ICD-10-CM | POA: Diagnosis not present

## 2017-08-02 DIAGNOSIS — L89314 Pressure ulcer of right buttock, stage 4: Secondary | ICD-10-CM | POA: Insufficient documentation

## 2017-08-02 DIAGNOSIS — L97228 Non-pressure chronic ulcer of left calf with other specified severity: Secondary | ICD-10-CM | POA: Insufficient documentation

## 2017-08-02 DIAGNOSIS — N186 End stage renal disease: Secondary | ICD-10-CM | POA: Insufficient documentation

## 2017-08-02 DIAGNOSIS — I509 Heart failure, unspecified: Secondary | ICD-10-CM | POA: Insufficient documentation

## 2017-08-02 DIAGNOSIS — I1311 Hypertensive heart and chronic kidney disease without heart failure, with stage 5 chronic kidney disease, or end stage renal disease: Secondary | ICD-10-CM | POA: Insufficient documentation

## 2017-08-02 DIAGNOSIS — G35 Multiple sclerosis: Secondary | ICD-10-CM | POA: Insufficient documentation

## 2017-08-02 DIAGNOSIS — L89622 Pressure ulcer of left heel, stage 2: Secondary | ICD-10-CM | POA: Insufficient documentation

## 2017-08-03 DIAGNOSIS — Z992 Dependence on renal dialysis: Secondary | ICD-10-CM | POA: Diagnosis not present

## 2017-08-03 DIAGNOSIS — D631 Anemia in chronic kidney disease: Secondary | ICD-10-CM | POA: Diagnosis not present

## 2017-08-03 DIAGNOSIS — N186 End stage renal disease: Secondary | ICD-10-CM | POA: Diagnosis not present

## 2017-08-03 DIAGNOSIS — E1122 Type 2 diabetes mellitus with diabetic chronic kidney disease: Secondary | ICD-10-CM | POA: Diagnosis not present

## 2017-08-03 DIAGNOSIS — Z23 Encounter for immunization: Secondary | ICD-10-CM | POA: Diagnosis not present

## 2017-08-03 DIAGNOSIS — N2581 Secondary hyperparathyroidism of renal origin: Secondary | ICD-10-CM | POA: Diagnosis not present

## 2017-08-05 DIAGNOSIS — D631 Anemia in chronic kidney disease: Secondary | ICD-10-CM | POA: Diagnosis not present

## 2017-08-05 DIAGNOSIS — N2581 Secondary hyperparathyroidism of renal origin: Secondary | ICD-10-CM | POA: Diagnosis not present

## 2017-08-05 DIAGNOSIS — Z23 Encounter for immunization: Secondary | ICD-10-CM | POA: Diagnosis not present

## 2017-08-05 DIAGNOSIS — E1122 Type 2 diabetes mellitus with diabetic chronic kidney disease: Secondary | ICD-10-CM | POA: Diagnosis not present

## 2017-08-05 DIAGNOSIS — Z992 Dependence on renal dialysis: Secondary | ICD-10-CM | POA: Diagnosis not present

## 2017-08-05 DIAGNOSIS — N186 End stage renal disease: Secondary | ICD-10-CM | POA: Diagnosis not present

## 2017-08-07 DIAGNOSIS — E1122 Type 2 diabetes mellitus with diabetic chronic kidney disease: Secondary | ICD-10-CM | POA: Diagnosis not present

## 2017-08-07 DIAGNOSIS — D631 Anemia in chronic kidney disease: Secondary | ICD-10-CM | POA: Diagnosis not present

## 2017-08-07 DIAGNOSIS — Z992 Dependence on renal dialysis: Secondary | ICD-10-CM | POA: Diagnosis not present

## 2017-08-07 DIAGNOSIS — Z23 Encounter for immunization: Secondary | ICD-10-CM | POA: Diagnosis not present

## 2017-08-07 DIAGNOSIS — N2581 Secondary hyperparathyroidism of renal origin: Secondary | ICD-10-CM | POA: Diagnosis not present

## 2017-08-07 DIAGNOSIS — N186 End stage renal disease: Secondary | ICD-10-CM | POA: Diagnosis not present

## 2017-08-09 DIAGNOSIS — E1122 Type 2 diabetes mellitus with diabetic chronic kidney disease: Secondary | ICD-10-CM | POA: Diagnosis not present

## 2017-08-09 DIAGNOSIS — L928 Other granulomatous disorders of the skin and subcutaneous tissue: Secondary | ICD-10-CM | POA: Diagnosis not present

## 2017-08-09 DIAGNOSIS — G8220 Paraplegia, unspecified: Secondary | ICD-10-CM | POA: Diagnosis not present

## 2017-08-09 DIAGNOSIS — L97228 Non-pressure chronic ulcer of left calf with other specified severity: Secondary | ICD-10-CM | POA: Diagnosis not present

## 2017-08-09 DIAGNOSIS — L89314 Pressure ulcer of right buttock, stage 4: Secondary | ICD-10-CM | POA: Diagnosis not present

## 2017-08-09 DIAGNOSIS — I509 Heart failure, unspecified: Secondary | ICD-10-CM | POA: Diagnosis not present

## 2017-08-09 DIAGNOSIS — I1311 Hypertensive heart and chronic kidney disease without heart failure, with stage 5 chronic kidney disease, or end stage renal disease: Secondary | ICD-10-CM | POA: Diagnosis not present

## 2017-08-09 DIAGNOSIS — N186 End stage renal disease: Secondary | ICD-10-CM | POA: Diagnosis not present

## 2017-08-09 DIAGNOSIS — G35 Multiple sclerosis: Secondary | ICD-10-CM | POA: Diagnosis not present

## 2017-08-09 DIAGNOSIS — N319 Neuromuscular dysfunction of bladder, unspecified: Secondary | ICD-10-CM | POA: Diagnosis not present

## 2017-08-09 DIAGNOSIS — L89622 Pressure ulcer of left heel, stage 2: Secondary | ICD-10-CM | POA: Diagnosis not present

## 2017-08-10 DIAGNOSIS — N186 End stage renal disease: Secondary | ICD-10-CM | POA: Diagnosis not present

## 2017-08-10 DIAGNOSIS — Z23 Encounter for immunization: Secondary | ICD-10-CM | POA: Diagnosis not present

## 2017-08-10 DIAGNOSIS — N2581 Secondary hyperparathyroidism of renal origin: Secondary | ICD-10-CM | POA: Diagnosis not present

## 2017-08-10 DIAGNOSIS — Z992 Dependence on renal dialysis: Secondary | ICD-10-CM | POA: Diagnosis not present

## 2017-08-10 DIAGNOSIS — D631 Anemia in chronic kidney disease: Secondary | ICD-10-CM | POA: Diagnosis not present

## 2017-08-10 DIAGNOSIS — E1122 Type 2 diabetes mellitus with diabetic chronic kidney disease: Secondary | ICD-10-CM | POA: Diagnosis not present

## 2017-08-12 DIAGNOSIS — D631 Anemia in chronic kidney disease: Secondary | ICD-10-CM | POA: Diagnosis not present

## 2017-08-12 DIAGNOSIS — N2581 Secondary hyperparathyroidism of renal origin: Secondary | ICD-10-CM | POA: Diagnosis not present

## 2017-08-12 DIAGNOSIS — Z992 Dependence on renal dialysis: Secondary | ICD-10-CM | POA: Diagnosis not present

## 2017-08-12 DIAGNOSIS — E1122 Type 2 diabetes mellitus with diabetic chronic kidney disease: Secondary | ICD-10-CM | POA: Diagnosis not present

## 2017-08-12 DIAGNOSIS — N186 End stage renal disease: Secondary | ICD-10-CM | POA: Diagnosis not present

## 2017-08-12 DIAGNOSIS — Z23 Encounter for immunization: Secondary | ICD-10-CM | POA: Diagnosis not present

## 2017-08-14 DIAGNOSIS — N186 End stage renal disease: Secondary | ICD-10-CM | POA: Diagnosis not present

## 2017-08-14 DIAGNOSIS — D631 Anemia in chronic kidney disease: Secondary | ICD-10-CM | POA: Diagnosis not present

## 2017-08-14 DIAGNOSIS — N2581 Secondary hyperparathyroidism of renal origin: Secondary | ICD-10-CM | POA: Diagnosis not present

## 2017-08-14 DIAGNOSIS — E1122 Type 2 diabetes mellitus with diabetic chronic kidney disease: Secondary | ICD-10-CM | POA: Diagnosis not present

## 2017-08-14 DIAGNOSIS — Z23 Encounter for immunization: Secondary | ICD-10-CM | POA: Diagnosis not present

## 2017-08-14 DIAGNOSIS — Z992 Dependence on renal dialysis: Secondary | ICD-10-CM | POA: Diagnosis not present

## 2017-08-17 DIAGNOSIS — N186 End stage renal disease: Secondary | ICD-10-CM | POA: Diagnosis not present

## 2017-08-17 DIAGNOSIS — D631 Anemia in chronic kidney disease: Secondary | ICD-10-CM | POA: Diagnosis not present

## 2017-08-17 DIAGNOSIS — Z992 Dependence on renal dialysis: Secondary | ICD-10-CM | POA: Diagnosis not present

## 2017-08-17 DIAGNOSIS — E1122 Type 2 diabetes mellitus with diabetic chronic kidney disease: Secondary | ICD-10-CM | POA: Diagnosis not present

## 2017-08-17 DIAGNOSIS — Z23 Encounter for immunization: Secondary | ICD-10-CM | POA: Diagnosis not present

## 2017-08-17 DIAGNOSIS — N2581 Secondary hyperparathyroidism of renal origin: Secondary | ICD-10-CM | POA: Diagnosis not present

## 2017-08-19 DIAGNOSIS — E1122 Type 2 diabetes mellitus with diabetic chronic kidney disease: Secondary | ICD-10-CM | POA: Diagnosis not present

## 2017-08-19 DIAGNOSIS — Z992 Dependence on renal dialysis: Secondary | ICD-10-CM | POA: Diagnosis not present

## 2017-08-19 DIAGNOSIS — N186 End stage renal disease: Secondary | ICD-10-CM | POA: Diagnosis not present

## 2017-08-19 DIAGNOSIS — D631 Anemia in chronic kidney disease: Secondary | ICD-10-CM | POA: Diagnosis not present

## 2017-08-19 DIAGNOSIS — N2581 Secondary hyperparathyroidism of renal origin: Secondary | ICD-10-CM | POA: Diagnosis not present

## 2017-08-19 DIAGNOSIS — Z23 Encounter for immunization: Secondary | ICD-10-CM | POA: Diagnosis not present

## 2017-08-21 DIAGNOSIS — E1122 Type 2 diabetes mellitus with diabetic chronic kidney disease: Secondary | ICD-10-CM | POA: Diagnosis not present

## 2017-08-21 DIAGNOSIS — Z23 Encounter for immunization: Secondary | ICD-10-CM | POA: Diagnosis not present

## 2017-08-21 DIAGNOSIS — N186 End stage renal disease: Secondary | ICD-10-CM | POA: Diagnosis not present

## 2017-08-21 DIAGNOSIS — N2581 Secondary hyperparathyroidism of renal origin: Secondary | ICD-10-CM | POA: Diagnosis not present

## 2017-08-21 DIAGNOSIS — Z992 Dependence on renal dialysis: Secondary | ICD-10-CM | POA: Diagnosis not present

## 2017-08-21 DIAGNOSIS — D631 Anemia in chronic kidney disease: Secondary | ICD-10-CM | POA: Diagnosis not present

## 2017-08-23 DIAGNOSIS — L89890 Pressure ulcer of other site, unstageable: Secondary | ICD-10-CM | POA: Diagnosis not present

## 2017-08-23 DIAGNOSIS — E1122 Type 2 diabetes mellitus with diabetic chronic kidney disease: Secondary | ICD-10-CM | POA: Diagnosis not present

## 2017-08-23 DIAGNOSIS — I1311 Hypertensive heart and chronic kidney disease without heart failure, with stage 5 chronic kidney disease, or end stage renal disease: Secondary | ICD-10-CM | POA: Diagnosis not present

## 2017-08-23 DIAGNOSIS — L89622 Pressure ulcer of left heel, stage 2: Secondary | ICD-10-CM | POA: Diagnosis not present

## 2017-08-23 DIAGNOSIS — L97228 Non-pressure chronic ulcer of left calf with other specified severity: Secondary | ICD-10-CM | POA: Diagnosis not present

## 2017-08-23 DIAGNOSIS — L89314 Pressure ulcer of right buttock, stage 4: Secondary | ICD-10-CM | POA: Diagnosis not present

## 2017-08-23 DIAGNOSIS — G35 Multiple sclerosis: Secondary | ICD-10-CM | POA: Diagnosis not present

## 2017-08-24 DIAGNOSIS — Z23 Encounter for immunization: Secondary | ICD-10-CM | POA: Diagnosis not present

## 2017-08-24 DIAGNOSIS — Z992 Dependence on renal dialysis: Secondary | ICD-10-CM | POA: Diagnosis not present

## 2017-08-24 DIAGNOSIS — D631 Anemia in chronic kidney disease: Secondary | ICD-10-CM | POA: Diagnosis not present

## 2017-08-24 DIAGNOSIS — N186 End stage renal disease: Secondary | ICD-10-CM | POA: Diagnosis not present

## 2017-08-24 DIAGNOSIS — E1122 Type 2 diabetes mellitus with diabetic chronic kidney disease: Secondary | ICD-10-CM | POA: Diagnosis not present

## 2017-08-24 DIAGNOSIS — N2581 Secondary hyperparathyroidism of renal origin: Secondary | ICD-10-CM | POA: Diagnosis not present

## 2017-08-26 DIAGNOSIS — N2581 Secondary hyperparathyroidism of renal origin: Secondary | ICD-10-CM | POA: Diagnosis not present

## 2017-08-26 DIAGNOSIS — E1122 Type 2 diabetes mellitus with diabetic chronic kidney disease: Secondary | ICD-10-CM | POA: Diagnosis not present

## 2017-08-26 DIAGNOSIS — Z992 Dependence on renal dialysis: Secondary | ICD-10-CM | POA: Diagnosis not present

## 2017-08-26 DIAGNOSIS — Z23 Encounter for immunization: Secondary | ICD-10-CM | POA: Diagnosis not present

## 2017-08-26 DIAGNOSIS — N186 End stage renal disease: Secondary | ICD-10-CM | POA: Diagnosis not present

## 2017-08-26 DIAGNOSIS — D631 Anemia in chronic kidney disease: Secondary | ICD-10-CM | POA: Diagnosis not present

## 2017-08-28 DIAGNOSIS — Z992 Dependence on renal dialysis: Secondary | ICD-10-CM | POA: Diagnosis not present

## 2017-08-28 DIAGNOSIS — N186 End stage renal disease: Secondary | ICD-10-CM | POA: Diagnosis not present

## 2017-08-28 DIAGNOSIS — Z23 Encounter for immunization: Secondary | ICD-10-CM | POA: Diagnosis not present

## 2017-08-28 DIAGNOSIS — E1122 Type 2 diabetes mellitus with diabetic chronic kidney disease: Secondary | ICD-10-CM | POA: Diagnosis not present

## 2017-08-28 DIAGNOSIS — D631 Anemia in chronic kidney disease: Secondary | ICD-10-CM | POA: Diagnosis not present

## 2017-08-28 DIAGNOSIS — N2581 Secondary hyperparathyroidism of renal origin: Secondary | ICD-10-CM | POA: Diagnosis not present

## 2017-08-31 DIAGNOSIS — D631 Anemia in chronic kidney disease: Secondary | ICD-10-CM | POA: Diagnosis not present

## 2017-08-31 DIAGNOSIS — E1122 Type 2 diabetes mellitus with diabetic chronic kidney disease: Secondary | ICD-10-CM | POA: Diagnosis not present

## 2017-08-31 DIAGNOSIS — N186 End stage renal disease: Secondary | ICD-10-CM | POA: Diagnosis not present

## 2017-08-31 DIAGNOSIS — Z992 Dependence on renal dialysis: Secondary | ICD-10-CM | POA: Diagnosis not present

## 2017-08-31 DIAGNOSIS — N2581 Secondary hyperparathyroidism of renal origin: Secondary | ICD-10-CM | POA: Diagnosis not present

## 2017-08-31 DIAGNOSIS — Z23 Encounter for immunization: Secondary | ICD-10-CM | POA: Diagnosis not present

## 2017-09-01 DIAGNOSIS — Z992 Dependence on renal dialysis: Secondary | ICD-10-CM | POA: Diagnosis not present

## 2017-09-01 DIAGNOSIS — T82858A Stenosis of vascular prosthetic devices, implants and grafts, initial encounter: Secondary | ICD-10-CM | POA: Diagnosis not present

## 2017-09-01 DIAGNOSIS — E1122 Type 2 diabetes mellitus with diabetic chronic kidney disease: Secondary | ICD-10-CM | POA: Diagnosis not present

## 2017-09-01 DIAGNOSIS — N186 End stage renal disease: Secondary | ICD-10-CM | POA: Diagnosis not present

## 2017-09-01 DIAGNOSIS — I871 Compression of vein: Secondary | ICD-10-CM | POA: Diagnosis not present

## 2017-09-02 DIAGNOSIS — E1122 Type 2 diabetes mellitus with diabetic chronic kidney disease: Secondary | ICD-10-CM | POA: Diagnosis not present

## 2017-09-02 DIAGNOSIS — N186 End stage renal disease: Secondary | ICD-10-CM | POA: Diagnosis not present

## 2017-09-02 DIAGNOSIS — N2581 Secondary hyperparathyroidism of renal origin: Secondary | ICD-10-CM | POA: Diagnosis not present

## 2017-09-02 DIAGNOSIS — D631 Anemia in chronic kidney disease: Secondary | ICD-10-CM | POA: Diagnosis not present

## 2017-09-02 DIAGNOSIS — Z992 Dependence on renal dialysis: Secondary | ICD-10-CM | POA: Diagnosis not present

## 2017-09-03 DIAGNOSIS — N319 Neuromuscular dysfunction of bladder, unspecified: Secondary | ICD-10-CM | POA: Diagnosis not present

## 2017-09-04 ENCOUNTER — Inpatient Hospital Stay (HOSPITAL_COMMUNITY)
Admission: EM | Admit: 2017-09-04 | Discharge: 2017-09-09 | DRG: 698 | Disposition: A | Discharge status: Home / Self Care | Payer: Medicare Other | Attending: Family Medicine | Admitting: Family Medicine

## 2017-09-04 ENCOUNTER — Other Ambulatory Visit: Payer: Self-pay

## 2017-09-04 ENCOUNTER — Emergency Department (HOSPITAL_COMMUNITY): Payer: Medicare Other

## 2017-09-04 DIAGNOSIS — Z515 Encounter for palliative care: Secondary | ICD-10-CM | POA: Diagnosis not present

## 2017-09-04 DIAGNOSIS — E8889 Other specified metabolic disorders: Secondary | ICD-10-CM | POA: Diagnosis present

## 2017-09-04 DIAGNOSIS — N390 Urinary tract infection, site not specified: Secondary | ICD-10-CM | POA: Diagnosis present

## 2017-09-04 DIAGNOSIS — A419 Sepsis, unspecified organism: Secondary | ICD-10-CM | POA: Diagnosis not present

## 2017-09-04 DIAGNOSIS — Z66 Do not resuscitate: Secondary | ICD-10-CM | POA: Diagnosis present

## 2017-09-04 DIAGNOSIS — I429 Cardiomyopathy, unspecified: Secondary | ICD-10-CM | POA: Diagnosis present

## 2017-09-04 DIAGNOSIS — Z7189 Other specified counseling: Secondary | ICD-10-CM

## 2017-09-04 DIAGNOSIS — E1169 Type 2 diabetes mellitus with other specified complication: Secondary | ICD-10-CM | POA: Diagnosis present

## 2017-09-04 DIAGNOSIS — Z992 Dependence on renal dialysis: Secondary | ICD-10-CM | POA: Diagnosis not present

## 2017-09-04 DIAGNOSIS — R310 Gross hematuria: Secondary | ICD-10-CM | POA: Diagnosis not present

## 2017-09-04 DIAGNOSIS — G35 Multiple sclerosis: Secondary | ICD-10-CM | POA: Diagnosis not present

## 2017-09-04 DIAGNOSIS — T83098A Other mechanical complication of other indwelling urethral catheter, initial encounter: Secondary | ICD-10-CM | POA: Diagnosis not present

## 2017-09-04 DIAGNOSIS — L8990 Pressure ulcer of unspecified site, unspecified stage: Secondary | ICD-10-CM | POA: Diagnosis present

## 2017-09-04 DIAGNOSIS — D696 Thrombocytopenia, unspecified: Secondary | ICD-10-CM | POA: Diagnosis present

## 2017-09-04 DIAGNOSIS — M2550 Pain in unspecified joint: Secondary | ICD-10-CM | POA: Diagnosis not present

## 2017-09-04 DIAGNOSIS — I132 Hypertensive heart and chronic kidney disease with heart failure and with stage 5 chronic kidney disease, or end stage renal disease: Secondary | ICD-10-CM | POA: Diagnosis present

## 2017-09-04 DIAGNOSIS — I120 Hypertensive chronic kidney disease with stage 5 chronic kidney disease or end stage renal disease: Secondary | ICD-10-CM | POA: Diagnosis not present

## 2017-09-04 DIAGNOSIS — N2581 Secondary hyperparathyroidism of renal origin: Secondary | ICD-10-CM | POA: Diagnosis present

## 2017-09-04 DIAGNOSIS — G8220 Paraplegia, unspecified: Secondary | ICD-10-CM | POA: Diagnosis present

## 2017-09-04 DIAGNOSIS — B961 Klebsiella pneumoniae [K. pneumoniae] as the cause of diseases classified elsewhere: Secondary | ICD-10-CM | POA: Diagnosis present

## 2017-09-04 DIAGNOSIS — A4189 Other specified sepsis: Secondary | ICD-10-CM | POA: Diagnosis present

## 2017-09-04 DIAGNOSIS — I509 Heart failure, unspecified: Secondary | ICD-10-CM | POA: Diagnosis present

## 2017-09-04 DIAGNOSIS — R030 Elevated blood-pressure reading, without diagnosis of hypertension: Secondary | ICD-10-CM | POA: Diagnosis not present

## 2017-09-04 DIAGNOSIS — Y846 Urinary catheterization as the cause of abnormal reaction of the patient, or of later complication, without mention of misadventure at the time of the procedure: Secondary | ICD-10-CM | POA: Diagnosis present

## 2017-09-04 DIAGNOSIS — R5081 Fever presenting with conditions classified elsewhere: Secondary | ICD-10-CM

## 2017-09-04 DIAGNOSIS — Z794 Long term (current) use of insulin: Secondary | ICD-10-CM

## 2017-09-04 DIAGNOSIS — E785 Hyperlipidemia, unspecified: Secondary | ICD-10-CM | POA: Diagnosis not present

## 2017-09-04 DIAGNOSIS — N186 End stage renal disease: Secondary | ICD-10-CM | POA: Diagnosis not present

## 2017-09-04 DIAGNOSIS — E1122 Type 2 diabetes mellitus with diabetic chronic kidney disease: Secondary | ICD-10-CM | POA: Diagnosis present

## 2017-09-04 DIAGNOSIS — D631 Anemia in chronic kidney disease: Secondary | ICD-10-CM | POA: Diagnosis present

## 2017-09-04 DIAGNOSIS — N319 Neuromuscular dysfunction of bladder, unspecified: Secondary | ICD-10-CM | POA: Diagnosis present

## 2017-09-04 DIAGNOSIS — Z8614 Personal history of Methicillin resistant Staphylococcus aureus infection: Secondary | ICD-10-CM

## 2017-09-04 DIAGNOSIS — D539 Nutritional anemia, unspecified: Secondary | ICD-10-CM | POA: Diagnosis not present

## 2017-09-04 DIAGNOSIS — T83511A Infection and inflammatory reaction due to indwelling urethral catheter, initial encounter: Principal | ICD-10-CM | POA: Diagnosis present

## 2017-09-04 DIAGNOSIS — R319 Hematuria, unspecified: Secondary | ICD-10-CM

## 2017-09-04 DIAGNOSIS — R509 Fever, unspecified: Secondary | ICD-10-CM | POA: Diagnosis not present

## 2017-09-04 DIAGNOSIS — Z993 Dependence on wheelchair: Secondary | ICD-10-CM

## 2017-09-04 DIAGNOSIS — Z87891 Personal history of nicotine dependence: Secondary | ICD-10-CM

## 2017-09-04 DIAGNOSIS — Z7401 Bed confinement status: Secondary | ICD-10-CM | POA: Diagnosis not present

## 2017-09-04 DIAGNOSIS — Z79899 Other long term (current) drug therapy: Secondary | ICD-10-CM

## 2017-09-04 DIAGNOSIS — M2062 Acquired deformities of toe(s), unspecified, left foot: Secondary | ICD-10-CM | POA: Diagnosis present

## 2017-09-04 LAB — CBC WITH DIFFERENTIAL/PLATELET
BASOS ABS: 0 10*3/uL (ref 0.0–0.1)
BASOS PCT: 0 %
EOS ABS: 0.1 10*3/uL (ref 0.0–0.7)
EOS PCT: 1 %
HCT: 39.4 % (ref 39.0–52.0)
HEMOGLOBIN: 12.4 g/dL — AB (ref 13.0–17.0)
LYMPHS PCT: 4 %
Lymphs Abs: 0.3 10*3/uL — ABNORMAL LOW (ref 0.7–4.0)
MCH: 32.7 pg (ref 26.0–34.0)
MCHC: 31.5 g/dL (ref 30.0–36.0)
MCV: 104 fL — ABNORMAL HIGH (ref 78.0–100.0)
Monocytes Absolute: 0.3 10*3/uL (ref 0.1–1.0)
Monocytes Relative: 3 %
NEUTROS ABS: 7.3 10*3/uL (ref 1.7–7.7)
Neutrophils Relative %: 92 %
PLATELETS: 127 10*3/uL — AB (ref 150–400)
RBC: 3.79 MIL/uL — AB (ref 4.22–5.81)
RDW: 15 % (ref 11.5–15.5)
WBC: 7.9 10*3/uL (ref 4.0–10.5)

## 2017-09-04 LAB — COMPREHENSIVE METABOLIC PANEL
ALBUMIN: 3 g/dL — AB (ref 3.5–5.0)
ALT: 9 U/L — ABNORMAL LOW (ref 17–63)
AST: 14 U/L — AB (ref 15–41)
Alkaline Phosphatase: 69 U/L (ref 38–126)
Anion gap: 12 (ref 5–15)
BUN: 52 mg/dL — AB (ref 6–20)
CHLORIDE: 98 mmol/L — AB (ref 101–111)
CO2: 27 mmol/L (ref 22–32)
Calcium: 9.7 mg/dL (ref 8.9–10.3)
Creatinine, Ser: 4.76 mg/dL — ABNORMAL HIGH (ref 0.61–1.24)
GFR calc Af Amer: 12 mL/min — ABNORMAL LOW (ref >60)
GFR calc non Af Amer: 11 mL/min — ABNORMAL LOW (ref >60)
GLUCOSE: 132 mg/dL — AB (ref 65–99)
POTASSIUM: 4.8 mmol/L (ref 3.5–5.1)
Sodium: 137 mmol/L (ref 135–145)
Total Bilirubin: 0.8 mg/dL (ref 0.3–1.2)
Total Protein: 8.5 g/dL — ABNORMAL HIGH (ref 6.5–8.1)

## 2017-09-04 LAB — URINALYSIS, MICROSCOPIC (REFLEX)
RBC / HPF: >50 RBC/hpf (ref 0–5)
Squamous Epithelial / LPF: NONE SEEN (ref 0–5)
WBC, UA: >50 WBC/hpf (ref 0–5)

## 2017-09-04 LAB — I-STAT CG4 LACTIC ACID, ED
Lactic Acid, Venous: 1.83 mmol/L (ref 0.5–1.9)
Lactic Acid, Venous: 1.13 mmol/L (ref 0.5–1.9)

## 2017-09-04 LAB — URINALYSIS, ROUTINE W REFLEX MICROSCOPIC

## 2017-09-04 LAB — PROTIME-INR
INR: 1.07
PROTHROMBIN TIME: 13.9 s (ref 11.4–15.2)

## 2017-09-04 MED ORDER — MELATONIN 3 MG PO TABS
3.0000 mg | ORAL_TABLET | Freq: Every day | ORAL | Status: DC
  Administered 2017-09-05 – 2017-09-08 (×5): 3 mg via ORAL
  Filled 2017-09-04 (×6): qty 1

## 2017-09-04 MED ORDER — SODIUM CHLORIDE 0.9 % IV SOLN
1000.0000 mL | INTRAVENOUS | Status: DC
  Administered 2017-09-04: 1000 mL via INTRAVENOUS

## 2017-09-04 MED ORDER — CARVEDILOL 3.125 MG PO TABS
3.1250 mg | ORAL_TABLET | Freq: Two times a day (BID) | ORAL | Status: DC
  Administered 2017-09-05 – 2017-09-08 (×7): 3.125 mg via ORAL
  Filled 2017-09-04 (×8): qty 1

## 2017-09-04 MED ORDER — ACETAMINOPHEN 650 MG RE SUPP
650.0000 mg | Freq: Once | RECTAL | Status: AC
  Administered 2017-09-04: 650 mg via RECTAL
  Filled 2017-09-04: qty 1

## 2017-09-04 MED ORDER — ALBUTEROL SULFATE (2.5 MG/3ML) 0.083% IN NEBU: 2.5000 mg | INHALATION_SOLUTION | Freq: Four times a day (QID) | RESPIRATORY_TRACT | Status: DC | PRN

## 2017-09-04 MED ORDER — BACLOFEN 10 MG PO TABS
10.0000 mg | ORAL_TABLET | Freq: Every evening | ORAL | Status: DC | PRN
  Filled 2017-09-04: qty 1

## 2017-09-04 MED ORDER — SODIUM CHLORIDE 0.9 % IV SOLN
2.0000 g | Freq: Once | INTRAVENOUS | Status: AC
  Administered 2017-09-04: 2 g via INTRAVENOUS
  Filled 2017-09-04: qty 2

## 2017-09-04 MED ORDER — INSULIN ASPART 100 UNIT/ML ~~LOC~~ SOLN
0.0000 [IU] | Freq: Three times a day (TID) | SUBCUTANEOUS | Status: DC
  Administered 2017-09-05: 2 [IU] via SUBCUTANEOUS
  Administered 2017-09-06: 3 [IU] via SUBCUTANEOUS
  Administered 2017-09-08: 5 [IU] via SUBCUTANEOUS
  Administered 2017-09-08 – 2017-09-09 (×2): 1 [IU] via SUBCUTANEOUS

## 2017-09-04 MED ORDER — INSULIN DETEMIR 100 UNIT/ML ~~LOC~~ SOLN
9.0000 [IU] | Freq: Every day | SUBCUTANEOUS | Status: DC
  Administered 2017-09-05 – 2017-09-09 (×5): 9 [IU] via SUBCUTANEOUS
  Filled 2017-09-04 (×5): qty 0.09

## 2017-09-04 MED ORDER — LIDOCAINE-PRILOCAINE 2.5-2.5 % EX CREA: 1.0000 "application " | TOPICAL_CREAM | CUTANEOUS | Status: DC | PRN

## 2017-09-04 MED ORDER — ONDANSETRON HCL 4 MG PO TABS: 4.0000 mg | ORAL_TABLET | Freq: Four times a day (QID) | ORAL | Status: DC | PRN

## 2017-09-04 MED ORDER — SODIUM CHLORIDE 0.9 % IV BOLUS: 500.0000 mL | Freq: Once | INTRAVENOUS | Status: DC

## 2017-09-04 MED ORDER — HYDROCERIN EX CREA: 1.0000 "application " | TOPICAL_CREAM | Freq: Every day | CUTANEOUS | Status: DC | PRN

## 2017-09-04 MED ORDER — ONDANSETRON HCL 4 MG/2ML IJ SOLN: 4.0000 mg | Freq: Four times a day (QID) | INTRAMUSCULAR | Status: DC | PRN

## 2017-09-04 MED ORDER — ACETAMINOPHEN 650 MG RE SUPP: 650.0000 mg | Freq: Four times a day (QID) | RECTAL | Status: DC | PRN

## 2017-09-04 MED ORDER — SIMVASTATIN 20 MG PO TABS
20.0000 mg | ORAL_TABLET | Freq: Every evening | ORAL | Status: DC
  Administered 2017-09-05 – 2017-09-08 (×4): 20 mg via ORAL
  Filled 2017-09-04 (×4): qty 1

## 2017-09-04 MED ORDER — ACETAMINOPHEN 325 MG PO TABS: 650.0000 mg | ORAL_TABLET | Freq: Four times a day (QID) | ORAL | Status: DC | PRN

## 2017-09-04 NOTE — ED Notes (Signed)
Patient transported to X-ray 

## 2017-09-04 NOTE — Progress Notes (Signed)
Pharmacy Antibiotic Note  KENNET MCCORT is a 77 y.o. male admitted on 09/04/2017 with urosepsis.  Pharmacy has been consulted for cefepime dosing. Cefepime 2gm ordered in the ED.  He is ESRD on HD usually TThS.  Did not get HD today  Plan: Cont cefepime 2gm after each HD F/u HD schedule  Height: 6\' 4"  (193 cm) Weight: 170 lb (77.1 kg) IBW/kg (Calculated) : 86.8  Temp (24hrs), Avg:102.6 F (39.2 C), Min:101.9 F (38.8 C), Max:103.1 F (39.5 C)  Recent Labs  Lab 09/04/17 1913 09/04/17 1927 09/04/17 2144  WBC 7.9  --   --   CREATININE 4.76*  --   --   LATICACIDVEN  --  1.83 1.13    Estimated Creatinine Clearance: 14.4 mL/min (A) (by C-G formula based on SCr of 4.76 mg/dL (H)).    No Known Allergies   Thank you for allowing pharmacy to be a part of this patient's care.  Delvecchio Madole, [place] 09/04/2017 10:43 PM

## 2017-09-04 NOTE — ED Provider Notes (Signed)
  Face-to-face evaluation   History: He complains of hematuria, in his catheter bag, which started yesterday.  He also complains of chills and fever for 2 days.  He denies any new sores on his body, and feels like the ones that he has are healing.  He has had mild cough which is occasionally productive.  He lives at home, and missed dialysis, today.  Physical exam: Alert, calm, cooperative.  He is elderly.  He is frail.  Vascular fistula right forearm with good thrill.  Abdomen soft nontender.  Lungs clear anteriorly.  Heart regular rate and rhythm with soft murmur.  Medical screening examination/treatment/procedure(s) were conducted as a shared visit with non-physician practitioner(s) and myself.  I personally evaluated the patient during the encounter    Daleen Bo, MD 09/05/17 2200

## 2017-09-04 NOTE — H&P (Addendum)
History and Physical    Lance [name]:[id number] [name]: April 19, 1941 DOA: 09/04/2017  Referring MD/NP/PA: Abigail Butts PA-C PCP: Lavone Orn, MD  Patient coming from: home   Chief Complaint: Blood in urine I have personally briefly reviewed patient's old medical records in [place]   HPI: Lance [name] is a 77 y.o. male with medical history significant of ESRD on HD, CHF, DM, MS, neurogenic bladder with chronic indwelling Foley, and  anemia; who presents with complaints of blood in urine.  Her baseline patient is wheelchair-bound his Foley catheter was changed yesterday as it normally is done once a month by [facility].  He normally notes a little blood in his urine following the exchange, but normally does not have gross blood like he saw today.  He complains of associated symptoms of chills, general malaise, and a mild cough Due to his symptoms he reports seeing his hemodialysis session today.  Denies any complaints of shortness of breath, nausea, vomiting, diarrhea, or abdominal discomfort/fullness.   ED Course: Upon admission into the emergency department patient was noted to be febrile up to 103.1 F, heart rates 94-1 06, respirations 11-20, and all other vital signs maintained.  Was revealed WBC 7.9, hemoglobin 12.4, platelets 127, potassium 4.8, BUN 52, creatinine 4.76, and lactic acid 1.83.  Urinalysis revealed gross blood.  Given patient's recent history of Klebsiella urinary tract infection he was started on cefepime.  Patient was not given full fluid bolus due to end-stage renal disease history.  Review of Systems  Constitutional: Positive for chills, fever and malaise/fatigue. Negative for diaphoresis.  HENT: Negative for congestion and nosebleeds.   Eyes: Negative for photophobia and pain.  Respiratory: Positive for cough. Negative for shortness of breath.   Cardiovascular: Negative for chest pain and leg swelling.    Gastrointestinal: Negative for abdominal pain, diarrhea and vomiting.  Genitourinary: Positive for hematuria. Negative for urgency.  Musculoskeletal: Negative for falls.  Skin: Negative for itching and rash.  Neurological: Negative for speech change, seizures and loss of consciousness.  Psychiatric/Behavioral: Negative for memory loss and substance abuse.    Past Medical History:  Diagnosis Date  . Acute CHF (congestive heart failure) ([place]) 02/14/2016  . Acute osteomyelitis, pelvis, right ([place])   . Acute renal failure ([place])   . Anemia in chronic renal disease   . Blood transfusion   . Cardiomyopathy ([place]) 02/17/2016  . Chronic spastic paraplegia    secondary to MS  . Decreased sensation of lower extremity    due to MS  . Decubitus ulcer of ischium   . Decubitus ulcer of trochanter, stage 4 ([place]) 06/14/2015  . ESRD (end stage renal disease) ([place])    [place]  . History of kidney stones    x2  . History of MRSA infection    urine  . History of recurrent UTIs   . Hypertension   . MS (multiple sclerosis) ([place])   . Multiple sclerosis ([place])    stated by client in triage   . Neuralgia neuritis, sciatic nerve    MS for 30 years  . Neurogenic bladder   . PONV (postoperative nausea and vomiting)   . Presence of indwelling urinary catheter   . Self-catheterizes urinary bladder   . Type 2 diabetes mellitus ([place])     Past Surgical History:  Procedure Laterality Date  . AMPUTATION Left 01/22/2017   Procedure: Left 5th Ray Amputation;  Surgeon: Newt Minion, MD;  Location: [facility]  OR;  Service: Orthopedics;  Laterality: Left;  . APPLICATION OF A-CELL OF EXTREMITY Left 09/05/2014   Procedure: PLACEMENT OF ACELL AND VAC;  Surgeon: Theodoro Kos, DO;  Location: [facility];  Service: Plastics;  Laterality: Left;  . BASCILIC VEIN TRANSPOSITION Right 03/24/2016   Procedure: RADIAL- CEPHALIC FISTULA CREATION RIGHT ARM;  Surgeon: Conrad Oneida Castle, MD;  Location: [facility];  Service:  Vascular;  Laterality: Right;  . Buttocks flap Left   . COLONOSCOPY    . EXTRACORPOREAL SHOCK WAVE LITHOTRIPSY  03/23/2011   x2  . FINGER SURGERY  2004  . hip flap Right   . INCISION AND DRAINAGE OF WOUND Left 09/05/2014   Procedure: IRRIGATION AND DEBRIDEMENT OF LEFT ISCHIUM WOUND WITH ;  Surgeon: Theodoro Kos, DO;  Location: [facility];  Service: Plastics;  Laterality: Left;  . INSERTION OF DIALYSIS CATHETER N/A 02/27/2016   Procedure: INSERTION OF DIALYSIS CATHETER-RIGHT INTERNAL JUGULA PLACEMENT;  Surgeon: Conrad Spring Grove, MD;  Location: [facility];  Service: Vascular;  Laterality: N/A;  . TONSILLECTOMY    . WOUND DEBRIDEMENT     [facility]      reports that he quit smoking about 34 years ago. His smoking use included cigars. He has never used smokeless tobacco. He reports that he does not drink alcohol or use drugs.  No Known Allergies  Family History  Problem Relation Age of Onset  . Stroke Father   . Diabetes Father   . Diabetes Paternal Grandmother   . Stroke Paternal Grandfather   . Heart disease Neg Hx   . Cancer Neg Hx     Prior to Admission medications   Medication Sig Start Date End Date Taking? Authorizing Provider  baclofen (LIORESAL) 10 MG tablet Take 1 tablet (10 mg total) by mouth at bedtime as needed for muscle spasms. 07/15/16  Yes Lauree Chandler, NP  carvedilol (COREG) 3.125 MG tablet Take 1 tablet (3.125 mg total) by mouth 2 (two) times daily with a meal. 07/15/16  Yes Eubanks, Carlos American, NP  insulin aspart (NOVOLOG FLEXPEN) 100 UNIT/ML FlexPen Inject 0-9 Units into the skin 3 (three) times daily with meals. Per sliding scale Patient taking differently: Inject 7 Units 3 (three) times daily with meals into the skin. Per sliding scale 07/20/16  Yes Lauree Chandler, NP  insulin detemir (LEVEMIR) 100 UNIT/ML injection Inject 9 Units into the skin daily with breakfast.   Yes [provider]  lidocaine-prilocaine (EMLA) cream Apply 1 application topically as  needed. Patient taking differently: Apply 1 application topically as needed (for pain).  07/24/16  Yes Conrad Dawn, MD  Melatonin 3 MG TABS Take 3 mg by mouth at bedtime.   Yes [provider]  simvastatin (ZOCOR) 20 MG tablet Take 20 mg by mouth every evening.   Yes [provider]  Skin Protectants, Misc. (EUCERIN) cream Apply 1 application daily as needed topically for dry skin. For feet    Yes [provider]  Insulin Pen Needle (PEN NEEDLES) 30G X 8 MM MISC Inject 1 each into the skin 4 (four) times daily. . 07/20/16   Lauree Chandler, NP    Physical Exam:  Constitutional: Elderly male who appears to be sick. Vitals:   09/04/17 1856 09/04/17 2102 09/04/17 2230  BP: (!) 184/83 (!) 144/73 126/74  Pulse: (!) 106 96 94  Resp: 20 12 11   Temp: (!) 103.1 F (39.5 C) (!) 102.9 F (39.4 C) (!) 101.9 F (38.8 C)  TempSrc: Oral  Rectal Oral  SpO2: 93% 94% 95%  Weight: 77.1 kg (170 lb)    Height: 6\' 4"  (1.93 m)     Eyes: PERRL, lids and conjunctivae normal ENMT: Mucous membranes are moist. Posterior pharynx clear of any exudate or lesions.  Neck: normal, supple, no masses, no thyromegaly Respiratory: clear to auscultation bilaterally, no wheezing, no crackles. Normal respiratory effort. No accessory muscle use.  Cardiovascular: Regular rate and rhythm, no murmurs / rubs / gallops. No extremity edema. 2+ pedal pulses. No carotid bruits.  Abdomen: no tenderness, no masses palpated. No hepatosplenomegaly. Bowel sounds positive.  Dark bloody output from Foley Musculoskeletal: no clubbing / cyanosis. No joint deformity upper and lower extremities. Good ROM, no contractures. Normal muscle tone.  Skin: Ulcer present rashes noted. Neurologic: CN 2-12 grossly intact.  Sensation abnormal.  Patient wheelchair-bound Psychiatric: Normal judgment and insight. Alert and oriented x 3. Normal mood.     Labs on Admission: I have personally reviewed following labs and imaging  studies  CBC: Recent Labs  Lab 09/04/17 1913  WBC 7.9  NEUTROABS 7.3  HGB 12.4*  HCT 39.4  MCV 104.0*  PLT 086*   Basic Metabolic Panel: Recent Labs  Lab 09/04/17 1913  NA 137  K 4.8  CL 98*  CO2 27  GLUCOSE 132*  BUN 52*  CREATININE 4.76*  CALCIUM 9.7   GFR: Estimated Creatinine Clearance: 14.4 mL/min (A) (by C-G formula based on SCr of 4.76 mg/dL (H)). Liver Function Tests: Recent Labs  Lab 09/04/17 1913  AST 14*  ALT 9*  ALKPHOS 69  BILITOT 0.8  PROT 8.5*  ALBUMIN 3.0*   No results for input(s): LIPASE, AMYLASE in the last 168 hours. No results for input(s): AMMONIA in the last 168 hours. Coagulation Profile: Recent Labs  Lab 09/04/17 1913  INR 1.07   Cardiac Enzymes: No results for input(s): CKTOTAL, CKMB, CKMBINDEX, TROPONINI in the last 168 hours. BNP (last 3 results) No results for input(s): PROBNP in the last 8760 hours. HbA1C: No results for input(s): HGBA1C in the last 72 hours. CBG: No results for input(s): GLUCAP in the last 168 hours. Lipid Profile: No results for input(s): CHOL, HDL, LDLCALC, TRIG, CHOLHDL, LDLDIRECT in the last 72 hours. Thyroid Function Tests: No results for input(s): TSH, T4TOTAL, FREET4, T3FREE, THYROIDAB in the last 72 hours. Anemia Panel: No results for input(s): VITAMINB12, FOLATE, FERRITIN, TIBC, IRON, RETICCTPCT in the last 72 hours. Urine analysis:    Component Value Date/Time   COLORURINE RED (A) 09/04/2017 1955   APPEARANCEUR TURBID (A) 09/04/2017 1955   LABSPEC  09/04/2017 1955    TEST NOT REPORTED DUE TO COLOR INTERFERENCE OF URINE PIGMENT   PHURINE  09/04/2017 1955    TEST NOT REPORTED DUE TO COLOR INTERFERENCE OF URINE PIGMENT   GLUCOSEU (A) 09/04/2017 1955    TEST NOT REPORTED DUE TO COLOR INTERFERENCE OF URINE PIGMENT   HGBUR (A) 09/04/2017 1955    TEST NOT REPORTED DUE TO COLOR INTERFERENCE OF URINE PIGMENT   BILIRUBINUR (A) 09/04/2017 1955    TEST NOT REPORTED DUE TO COLOR INTERFERENCE OF  URINE PIGMENT   KETONESUR (A) 09/04/2017 1955    TEST NOT REPORTED DUE TO COLOR INTERFERENCE OF URINE PIGMENT   PROTEINUR (A) 09/04/2017 1955    TEST NOT REPORTED DUE TO COLOR INTERFERENCE OF URINE PIGMENT   UROBILINOGEN 0.2 08/30/2014 2203   NITRITE (A) 09/04/2017 1955    TEST NOT REPORTED DUE TO COLOR INTERFERENCE OF URINE PIGMENT  LEUKOCYTESUR (A) 09/04/2017 1955    TEST NOT REPORTED DUE TO COLOR INTERFERENCE OF URINE PIGMENT   Sepsis Labs: No results found for this or any previous visit (from the past 240 hour(s)).   Radiological Exams on Admission: Dg Chest 2 View  Result Date: 09/04/2017 CLINICAL DATA:  Sepsis. EXAM: CHEST - 2 VIEW COMPARISON:  06/09/2017 FINDINGS: Mildly enlarged cardiac silhouette. Calcific atherosclerotic disease of the aorta. Mediastinal contours appear intact. There is no evidence of lobar airspace consolidation, pleural effusion or pneumothorax. Diffusely increased interstitial markings and septal thickening. Osseous structures are without acute abnormality. Soft tissues are grossly normal. IMPRESSION: Probable interstitial pulmonary edema. No lobar consolidations seen. Electronically Signed   By: Fidela Salisbury M.D.   On: 09/04/2017 19:32    EKG: Independently reviewed. Sinus rhythm 92 bpm with QTC 492  Assessment/Plan Sepsis 2/2 catheter associated urinary tract infection: Acute.  Patient presents with fever up to 103.1 F and tachycardia.  Urinalysis is noted to be abnormal significantly bloody.  Suspect infection as likely cause of symptoms.  Given recent history of resistant Klebsiella in urine patient was empirically treated with cefepime. - Admit to a telemetry - Follow-up blood and urine cultures - Continue empiric antibiotics of cefepime - Question adding on vancomycin given remote history of MRSA - Tylenol prn fever    Hematuria: Acute.  Symptoms following recent Foley catheter exchange. - Check renal ultrasound   ESRD on HD: Patient  normally dialyzes T/Th/Sat, but missed hemodialysis due to symptoms.  Labs are relatively stable with potassium of 4.8, BUN 36, and creatinine 4.76.  - Will need to call nephrology in a.m. as patient will likely need dialysis  Pulmonary edema: Acute. Chest x-ray showing likely pulmonary edema.  Patient denies having any significant signs of shortness of breath. - Nasal cannula oxygen as needed overnight  Multiple sclerosis with nephrogenic bladder: Placed Patient is wheelchair-bound and utilizes a power wheelchair to get around. - Continue baclofen  Diabetes mellitus type 2: Patient's last known hemoglobin A1c noted to be 6.1 on 04/10/2016. - Hypoglycemic protocol - Continue Levemir per home regimen - CBGs q. before meals with sensitive SSI  Macrocytic anemia: Chronic.  Hemoglobin 12.4 on admission which appears improved from previous. - Check vitamin B12 and folate in a.m.  Thrombocytopenia: Acute on chronic.  Patient has intermittently had low platelet counts previously in the past.  Platelet count 127 on admission. - Continue repeat CBC in a.m.  Hyperlipidemia - Continue simvastatin DVT prophylaxis: SCDs  Code Status: DNR Family Communication: No family present at home Disposition Plan: Discharge home once medically Consults called:none Admission status: Inpatient  Norval Morton MD [facility] Pager [phone]   If 7PM-7AM, please contact night-coverage [email or web address] Password [email or web address]  09/04/2017, 11:09 PM

## 2017-09-04 NOTE — ED Provider Notes (Signed)
[facility] Provider Note   CSN: [id number] Arrival date & time: 09/04/17  1836     History   Chief Complaint Chief Complaint  Patient presents with  . Hematuria    HPI Lance [name] is a 77 y.o. male with a hx of CHF,ESRD on dialysis (Tu/Th/Sat - last dialyzed Thurs), chronicdecubitus ulcer with wound VAC, chronic spastic paraplegia secondary to MS and neurogenic bladder with chronic indwelling Foley catheter presents to the Emergency Department complaining of gradual, persistent, progressively worsening gross hematuria and general illness onset this morning.  Pt reports his foley was changed yesterday by home health.  He reports the foley is changed 1x per month and it is not unusual for there to be some bleeding, but it was significantly worse today.  He reports chills, subjective fevers and rigors at home today.  He reports he felt so badly that he did not go to his scheduled dialysis.  He reports no other specific symptoms and denies cough, congestion, ST, sick contacts.  Nothing seems to make his symptoms better or worse.    Record review shows several episodes of urosepsis in the last year.  Cultures have grown klebsiella.    The history is provided by the patient and medical records. No language interpreter was used.    Past Medical History:  Diagnosis Date  . Acute CHF (congestive heart failure) ([place]) 02/14/2016  . Acute osteomyelitis, pelvis, right ([place])   . Acute renal failure ([place])   . Anemia in chronic renal disease   . Blood transfusion   . Cardiomyopathy ([place]) 02/17/2016  . Chronic spastic paraplegia    secondary to MS  . Decreased sensation of lower extremity    due to MS  . Decubitus ulcer of ischium   . Decubitus ulcer of trochanter, stage 4 ([place]) 06/14/2015  . ESRD (end stage renal disease) ([place])    [place]  . History of kidney stones    x2  . History of MRSA infection    urine  . History  of recurrent UTIs   . Hypertension   . MS (multiple sclerosis) ([place])   . Multiple sclerosis ([place])    stated by client in triage   . Neuralgia neuritis, sciatic nerve    MS for 30 years  . Neurogenic bladder   . PONV (postoperative nausea and vomiting)   . Presence of indwelling urinary catheter   . Self-catheterizes urinary bladder   . Type 2 diabetes mellitus [facility])     Patient Active Problem List   Diagnosis Date Noted  . Pressure injury of skin 09/04/2017  . Moderate protein-calorie malnutrition ([place]) 03/19/2017  . Sepsis secondary to UTI ([place]) 03/18/2017  . Sepsis ([place]) 03/18/2017  . Partial nontraumatic amputation of foot, left ([place]) 01/27/2017  . Malnutrition of moderate degree 07/03/2016  . Symptomatic anemia 07/02/2016  . SIRS (systemic inflammatory response syndrome) ([place]) 05/09/2016  . Melena 05/09/2016  . Hematemesis 05/09/2016  . Non-intractable vomiting with nausea 05/07/2016  . Altered mental status 04/21/2016  . Acute respiratory failure ([place])   . ESRD on dialysis ([place])   . NICM (nonischemic cardiomyopathy) ([place])   . Impaired functional mobility, balance, and endurance 02/17/2016  . Generalized anxiety disorder 02/17/2016  . Cardiomyopathy ([place]) 02/17/2016  . Hypoxia   . Acute pulmonary edema (H[facility])   . Elevated troponin 02/14/2016  . Acute systolic heart failure ([place]) 02/14/2016  . Depression 02/13/2016  . DM (diabetes mellitus) type II  controlled with renal manifestation ([place]) 09/06/2015  . Dyslipidemia associated with type 2 diabetes mellitus ([place]) 09/06/2015  . Recurrent UTI (urinary tract infection) 09/06/2015  . Decubitus ulcer of right ischial area 06/14/2015  . Decubitus ulcer of trochanter, stage 4 ([place]) 06/14/2015  . Left perineal ischial pressure ulcer   . Acute osteomyelitis, pelvis, right ([place])   . Essential hypertension, benign 08/03/2013  . Spastic quadriplegia ([place]) 07/10/2011  . Multiple sclerosis ([place]) 03/30/2011  . Sacral decubitus  ulcer 03/30/2011  . Progressive anemia 03/30/2011    Past Surgical History:  Procedure Laterality Date  . AMPUTATION Left 01/22/2017   Procedure: Left 5th Ray Amputation;  Surgeon: Newt Minion, MD;  Location: [facility];  Service: Orthopedics;  Laterality: Left;  . APPLICATION OF A-CELL OF EXTREMITY Left 09/05/2014   Procedure: PLACEMENT OF ACELL AND VAC;  Surgeon: Theodoro Kos, DO;  Location: [facility];  Service: Plastics;  Laterality: Left;  . BASCILIC VEIN TRANSPOSITION Right 03/24/2016   Procedure: RADIAL- CEPHALIC FISTULA CREATION RIGHT ARM;  Surgeon: Conrad Liverpool, MD;  Location: [facility];  Service: Vascular;  Laterality: Right;  . Buttocks flap Left   . COLONOSCOPY    . EXTRACORPOREAL SHOCK WAVE LITHOTRIPSY  03/23/2011   x2  . FINGER SURGERY  2004  . hip flap Right   . INCISION AND DRAINAGE OF WOUND Left 09/05/2014   Procedure: IRRIGATION AND DEBRIDEMENT OF LEFT ISCHIUM WOUND WITH ;  Surgeon: Theodoro Kos, DO;  Location: [facility];  Service: Plastics;  Laterality: Left;  . INSERTION OF DIALYSIS CATHETER N/A 02/27/2016   Procedure: INSERTION OF DIALYSIS CATHETER-RIGHT INTERNAL JUGULA PLACEMENT;  Surgeon: Conrad Boaz, MD;  Location: [facility];  Service: Vascular;  Laterality: N/A;  . TONSILLECTOMY    . WOUND DEBRIDEMENT     10/2/17[facility]         Home Medications    Prior to Admission medications   Medication Sig Start Date End Date Taking? Authorizing Provider  baclofen (LIORESAL) 10 MG tablet Take 1 tablet (10 mg total) by mouth at bedtime as needed for muscle spasms. 07/15/16  Yes Lauree Chandler, NP  carvedilol (COREG) 3.125 MG tablet Take 1 tablet (3.125 mg total) by mouth 2 (two) times daily with a meal. 07/15/16  Yes Eubanks, Carlos American, NP  insulin aspart (NOVOLOG FLEXPEN) 100 UNIT/ML FlexPen Inject 0-9 Units into the skin 3 (three) times daily with meals. Per sliding scale Patient taking differently: Inject 7 Units 3 (three) times daily with meals into the skin. Per sliding scale 07/20/16   Yes Lauree Chandler, NP  insulin detemir (LEVEMIR) 100 UNIT/ML injection Inject 9 Units into the skin daily with breakfast.   Yes [provider]  lidocaine-prilocaine (EMLA) cream Apply 1 application topically as needed. Patient taking differently: Apply 1 application topically as needed (for pain).  07/24/16  Yes Conrad Cowan, MD  Melatonin 3 MG TABS Take 3 mg by mouth at bedtime.   Yes [provider]  simvastatin (ZOCOR) 20 MG tablet Take 20 mg by mouth every evening.   Yes [provider]  Skin Protectants, Misc. (EUCERIN) cream Apply 1 application daily as needed topically for dry skin. For feet    Yes [provider]  Insulin Pen Needle (PEN NEEDLES) 30G X 8 MM MISC Inject 1 each into the skin 4 (four) times daily. . 07/20/16   Lauree Chandler, NP    Family History Family History  Problem Relation Age of Onset  .  Stroke Father   . Diabetes Father   . Diabetes Paternal Grandmother   . Stroke Paternal Grandfather   . Heart disease Neg Hx   . Cancer Neg Hx     Social History Social History   Tobacco Use  . Smoking status: Former Smoker    Types: Cigars    Last attempt to quit: 05/06/1983    Years since quitting: 34.3  . Smokeless tobacco: Never Used  Substance Use Topics  . Alcohol use: No    Alcohol/week: 0.6 oz    Types: 1 Glasses of wine per week    Comment: pt has not had any since Dec 2017  . Drug use: No     Allergies   Patient has no known allergies.   Review of Systems Review of Systems  Constitutional: Positive for chills, fatigue and fever. Negative for appetite change, diaphoresis and unexpected weight change.  HENT: Negative for mouth sores.   Eyes: Negative for visual disturbance.  Respiratory: Negative for cough, chest tightness, shortness of breath and wheezing.   Cardiovascular: Negative for chest pain.  Gastrointestinal: Negative for abdominal pain, constipation, diarrhea, nausea and vomiting.  Endocrine:  Negative for polydipsia, polyphagia and polyuria.  Genitourinary: Positive for hematuria. Negative for dysuria, frequency and urgency.  Musculoskeletal: Negative for back pain and neck stiffness.  Skin: Negative for rash.  Allergic/Immunologic: Negative for immunocompromised state.  Neurological: Negative for syncope, light-headedness and headaches.  Hematological: Does not bruise/bleed easily.  Psychiatric/Behavioral: Negative for sleep disturbance. The patient is not nervous/anxious.      Physical Exam Updated Vital Signs BP (!) 144/73 (BP Location: Left Arm)   Pulse 96   Temp (!) 102.9 F (39.4 C) (Rectal)   Resp 12   Ht 6\' 4"  (1.93 m)   Wt 77.1 kg (170 lb)   SpO2 94%   BMI 20.69 kg/m   Physical Exam  Constitutional: He appears well-developed and well-nourished. No distress.  Pt covering his head with a blanket stating he is very cold; skin is hot to touch  HENT:  Head: Normocephalic and atraumatic.  Eyes: Conjunctivae and EOM are normal. No scleral icterus.  Neck: Normal range of motion.  No nuchal rigidity.  Cardiovascular: Normal rate and intact distal pulses.  Murmur heard. Pulmonary/Chest: Effort normal and breath sounds normal. No respiratory distress. He has no wheezes. He has no rales.  Abdominal: Soft. Bowel sounds are normal. He exhibits no distension. There is no tenderness. There is no guarding.  Genitourinary: Penis normal.  Genitourinary Comments: Urinary catheter in place.  No bleeding from the urethral meatus.  Musculoskeletal:  Muscle atrophy and loss of muscle tone in the bilateral lower extremities. Dialysis fistula in the right forearm with palpable thrill.  No signs of infection.  Neurological: He is alert.  Skin: Skin is warm and dry. No rash noted. He is not diaphoretic.  Small wound noted to the posterior left leg and left heel  Umbilicated sacral wound without erythema or drainage No signs of secondary infection to any site  Psychiatric: He  has a normal mood and affect.  Nursing note and vitals reviewed.    ED Treatments / Results  Labs (all labs ordered are listed, but only abnormal results are displayed) Labs Reviewed  COMPREHENSIVE METABOLIC PANEL - Abnormal; Notable for the following components:      Result Value   Chloride 98 (*)    Glucose, Bld 132 (*)    BUN 52 (*)    Creatinine, Ser 4.76 (*)  Total Protein 8.5 (*)    Albumin 3.0 (*)    AST 14 (*)    ALT 9 (*)    GFR calc non Af Amer 11 (*)    GFR calc Af Amer 12 (*)    All other components within normal limits  CBC WITH DIFFERENTIAL/PLATELET - Abnormal; Notable for the following components:   RBC 3.79 (*)    Hemoglobin 12.4 (*)    MCV 104.0 (*)    Platelets 127 (*)    Lymphs Abs 0.3 (*)    All other components within normal limits  URINALYSIS, ROUTINE W REFLEX MICROSCOPIC - Abnormal; Notable for the following components:   Color, Urine RED (*)    APPearance TURBID (*)    Glucose, UA   (*)    Value: TEST NOT REPORTED DUE TO COLOR INTERFERENCE OF URINE PIGMENT   Hgb urine dipstick   (*)    Value: TEST NOT REPORTED DUE TO COLOR INTERFERENCE OF URINE PIGMENT   Bilirubin Urine   (*)    Value: TEST NOT REPORTED DUE TO COLOR INTERFERENCE OF URINE PIGMENT   Ketones, ur   (*)    Value: TEST NOT REPORTED DUE TO COLOR INTERFERENCE OF URINE PIGMENT   Protein, ur   (*)    Value: TEST NOT REPORTED DUE TO COLOR INTERFERENCE OF URINE PIGMENT   Nitrite   (*)    Value: TEST NOT REPORTED DUE TO COLOR INTERFERENCE OF URINE PIGMENT   Leukocytes, UA   (*)    Value: TEST NOT REPORTED DUE TO COLOR INTERFERENCE OF URINE PIGMENT   All other components within normal limits  URINALYSIS, MICROSCOPIC (REFLEX) - Abnormal; Notable for the following components:   Bacteria, UA PRESENT (*)    All other components within normal limits  CULTURE, BLOOD (ROUTINE X 2)  CULTURE, BLOOD (ROUTINE X 2)  URINE CULTURE  PROTIME-INR  CBC  RENAL FUNCTION PANEL  VITAMIN B12  FOLATE RBC   I-STAT CG4 LACTIC ACID, ED  I-STAT CG4 LACTIC ACID, ED    EKG EKG Interpretation  Date/Time:  Saturday Sep 04 2017 22:42:58 EDT Ventricular Rate:  92 PR Interval:    QRS Duration: 92 QT Interval:  398 QTC Calculation: 493 R Axis:   85 Text Interpretation:  Sinus rhythm Borderline right axis deviation Borderline prolonged QT interval No significant change was found Confirmed by Ezequiel Essex [phone]) on 09/05/2017 12:42:56 AM      Radiology Dg Chest 2 View  Result Date: 09/04/2017 CLINICAL DATA:  Sepsis. EXAM: CHEST - 2 VIEW COMPARISON:  06/09/2017 FINDINGS: Mildly enlarged cardiac silhouette. Calcific atherosclerotic disease of the aorta. Mediastinal contours appear intact. There is no evidence of lobar airspace consolidation, pleural effusion or pneumothorax. Diffusely increased interstitial markings and septal thickening. Osseous structures are without acute abnormality. Soft tissues are grossly normal. IMPRESSION: Probable interstitial pulmonary edema. No lobar consolidations seen. Electronically Signed   By: Fidela Salisbury M.D.   On: 09/04/2017 19:32    Procedures Procedures (including critical care time)  Medications Ordered in ED Medications  carvedilol (COREG) tablet 3.125 mg (has no administration in time range)  lidocaine-prilocaine (EMLA) cream 1 application (has no administration in time range)  Melatonin TABS 3 mg (has no administration in time range)  insulin detemir (LEVEMIR) injection 9 Units (has no administration in time range)  simvastatin (ZOCOR) tablet 20 mg (has no administration in time range)  hydrocerin (EUCERIN) cream 1 application (has no administration in time range)  baclofen (LIORESAL) tablet  10 mg (has no administration in time range)  ondansetron (ZOFRAN) tablet 4 mg (has no administration in time range)    Or  ondansetron (ZOFRAN) injection 4 mg (has no administration in time range)  acetaminophen (TYLENOL) tablet 650 mg (has no  administration in time range)    Or  acetaminophen (TYLENOL) suppository 650 mg (has no administration in time range)  albuterol (PROVENTIL) (2.5 MG/3ML) 0.083% nebulizer solution 2.5 mg (has no administration in time range)  insulin aspart (novoLOG) injection 0-9 Units (has no administration in time range)  acetaminophen (TYLENOL) suppository 650 mg (650 mg Rectal Given 09/04/17 2049)  ceFEPIme (MAXIPIME) 2 g in sodium chloride 0.9 % 100 mL IVPB (0 g Intravenous Stopped 09/04/17 2322)     Initial Impression / Assessment and Plan / ED Course  I have reviewed the triage vital signs and the nursing notes.  Pertinent labs & imaging results that were available during my care of the patient were reviewed by me and considered in my medical decision making (see chart for details).  Clinical Course as of Sep 05 44  Sat Sep 04, 2017  2311 Discussed with Dr. Tamala Julian who will admit   [HM]    Clinical Course User Index [HM] Halina Asano, Jarrett Soho, [place]    Patient presents with gross hematuria and fevers.  He has a history of urosepsis and I suspect the same today.  No URI symptoms.  Chest x-ray shows probable interstitial pulmonary edema.  Patient is without hypoxia, no rales on exam.  No peripheral edema.  I personally evaluated these images.  No evidence of pneumonia, pneumothorax.  Patient does have a history of chronic wounds.  No evidence of secondary infection today.  I doubt this is the source of his fever.   Labs are reassuring with a lactic acid of 1.13 and white blood cell count of 7.9.  His urinalysis shows bacteria and white blood cells however they were unable to test for ketones and leukocytes due to gross blood.  Patient arrives febrile with a fever of 103.1 and a heart rate of 106.  Initially hypertensive with oxygen saturation of 93%.  His heart rate has improved some and fever has decreased however he remains febrile at 101.9.  Mild anemia with hemoglobin of 12.4 and platelets are slightly  low at 127.  Creatinine 4.76 today.  Patient will need admission for monitoring as I am concerned for early urosepsis.    She has been given fluids but is not hypotensive.  He was not given the 30 mL/kg bolus due to normal blood pressure.  Patient also treated for health care acquired urinary tract infection.  Record review showed previous cultures have grown Klebsiella which is without significant resistance.  The patient was discussed with and seen by Dr. Eulis Foster who agrees with the treatment plan.   Final Clinical Impressions(s) / ED Diagnoses   Final diagnoses:  Gross hematuria  Urinary tract infection associated with indwelling urethral catheter, initial encounter [facility])  Fever in other diseases    ED Discharge Orders    None       Agapito Games 09/05/17 0046    Daleen Bo, MD 09/05/17 2200

## 2017-09-04 NOTE — ED Notes (Signed)
EDP made aware that pt is suspected sepsis.

## 2017-09-04 NOTE — ED Triage Notes (Signed)
Pt BIB EMS from home for hematuria. Seen yesterday and had catheter replaced. On arrival pt febrile temp 103.2, HR 106, 93% RA. Agricultural consultant and EDP made aware.   Pt dialysis pt, did not receive dialysis today. Pt not ambulatory, hoyer lift used at home.

## 2017-09-05 ENCOUNTER — Encounter (HOSPITAL_COMMUNITY): Payer: Self-pay

## 2017-09-05 ENCOUNTER — Inpatient Hospital Stay (HOSPITAL_COMMUNITY): Payer: Medicare Other

## 2017-09-05 DIAGNOSIS — Z992 Dependence on renal dialysis: Secondary | ICD-10-CM

## 2017-09-05 DIAGNOSIS — N186 End stage renal disease: Secondary | ICD-10-CM

## 2017-09-05 DIAGNOSIS — D696 Thrombocytopenia, unspecified: Secondary | ICD-10-CM | POA: Diagnosis present

## 2017-09-05 LAB — RENAL FUNCTION PANEL
ALBUMIN: 2.7 g/dL — AB (ref 3.5–5.0)
ANION GAP: 13 (ref 5–15)
BUN: 57 mg/dL — AB (ref 6–20)
CO2: 23 mmol/L (ref 22–32)
Calcium: 9 mg/dL (ref 8.9–10.3)
Chloride: 98 mmol/L — ABNORMAL LOW (ref 101–111)
Creatinine, Ser: 4.96 mg/dL — ABNORMAL HIGH (ref 0.61–1.24)
GFR, EST AFRICAN AMERICAN: 12 mL/min — AB (ref >60)
GFR, EST NON AFRICAN AMERICAN: 10 mL/min — AB (ref >60)
Glucose, Bld: 96 mg/dL (ref 65–99)
PHOSPHORUS: 5.1 mg/dL — AB (ref 2.5–4.6)
POTASSIUM: 4.3 mmol/L (ref 3.5–5.1)
Sodium: 134 mmol/L — ABNORMAL LOW (ref 135–145)

## 2017-09-05 LAB — URINE CULTURE

## 2017-09-05 LAB — CBC
HEMATOCRIT: 33.4 % — AB (ref 39.0–52.0)
Hemoglobin: 10.5 g/dL — ABNORMAL LOW (ref 13.0–17.0)
MCH: 32.4 pg (ref 26.0–34.0)
MCHC: 31.4 g/dL (ref 30.0–36.0)
MCV: 103.1 fL — AB (ref 78.0–100.0)
Platelets: 122 10*3/uL — ABNORMAL LOW (ref 150–400)
RBC: 3.24 MIL/uL — ABNORMAL LOW (ref 4.22–5.81)
RDW: 14.9 % (ref 11.5–15.5)
WBC: 8.3 10*3/uL (ref 4.0–10.5)

## 2017-09-05 LAB — GLUCOSE, CAPILLARY
GLUCOSE-CAPILLARY: 178 mg/dL — AB (ref 65–99)
GLUCOSE-CAPILLARY: 103 mg/dL — AB (ref 65–99)
GLUCOSE-CAPILLARY: 105 mg/dL — AB (ref 65–99)
Glucose-Capillary: 147 mg/dL — ABNORMAL HIGH (ref 65–99)

## 2017-09-05 LAB — VITAMIN B12: VITAMIN B 12: 366 pg/mL (ref 180–914)

## 2017-09-05 MED ORDER — SODIUM CHLORIDE 0.9 % IV SOLN: 2.0000 g | INTRAVENOUS | Status: DC

## 2017-09-05 MED ORDER — PRO-STAT SUGAR FREE PO LIQD
30.0000 mL | Freq: Two times a day (BID) | ORAL | Status: DC
  Administered 2017-09-05 – 2017-09-08 (×5): 30 mL via ORAL
  Filled 2017-09-05 (×5): qty 30

## 2017-09-05 MED ORDER — SODIUM CHLORIDE 0.9 % IV SOLN
2.0000 g | INTRAVENOUS | Status: DC
  Filled 2017-09-05: qty 2

## 2017-09-05 NOTE — ED Notes (Signed)
Attempted to call report

## 2017-09-05 NOTE — Consult Note (Addendum)
[facility] Renal Consultation Note    Indication for Consultation:  Management of ESRD/hemodialysis, anemia, hypertension/volume, and secondary hyperparathyroidism. PCP:  HPI: Lance [name] is a 77 y.o. male with ESRD, MS with neurogenic bladder with chronic foley, chronic paraplegia (d/t MS), and Type 2 DM who was admitted with sepsis/UTI.   Mr. Deupree reported gross hematuria in foley bag and chills x 1 day prior to presentation. No known fever at home. Missed HD on Saturday d/t feeling poorly. Presented to ED on 5/4, found to be febrile to 103F. Labs showed K 4.8, WBC 7.9, Hgb 12.4, urine with frank blood present. Urine and blood Cx collected, pending. He was started on Cefepime given Hx last UTI Cx results which grew Klebsiella. CXR showed appearance of possible pulmonary edema. Feels ok at this time. No CP, dyspnea, N/V, abd pain.  Dialyzes TTS at [facility] via AVF. Missed HD yesterday for the 1st time ever. Last HD was on 5/2 which he completed in entirety.  Past Medical History:  Diagnosis Date  . Acute CHF (congestive heart failure) ([place]) 02/14/2016  . Acute osteomyelitis, pelvis, right ([place])   . Acute renal failure ([place])   . Anemia in chronic renal disease   . Blood transfusion   . Cardiomyopathy ([place]) 02/17/2016  . Chronic spastic paraplegia    secondary to MS  . Decreased sensation of lower extremity    due to MS  . Decubitus ulcer of ischium   . Decubitus ulcer of trochanter, stage 4 ([place]) 06/14/2015  . ESRD (end stage renal disease) ([place])    [place]  . History of kidney stones    x2  . History of MRSA infection    urine  . History of recurrent UTIs   . Hypertension   . MS (multiple sclerosis) ([place])   . Multiple sclerosis ([place])    stated by client in triage   . Neuralgia neuritis, sciatic nerve    MS for 30 years  . Neurogenic bladder   . PONV (postoperative nausea and vomiting)   . Presence of indwelling  urinary catheter   . Self-catheterizes urinary bladder   . Type 2 diabetes mellitus ([place])    Past Surgical History:  Procedure Laterality Date  . AMPUTATION Left 01/22/2017   Procedure: Left 5th Ray Amputation;  Surgeon: Newt Minion, MD;  Location: [place];  Service: Orthopedics;  Laterality: Left;  . APPLICATION OF A-CELL OF EXTREMITY Left 09/05/2014   Procedure: PLACEMENT OF ACELL AND VAC;  Surgeon: Theodoro Kos, DO;  Location: [place];  Service: Plastics;  Laterality: Left;  . BASCILIC VEIN TRANSPOSITION Right 03/24/2016   Procedure: RADIAL- CEPHALIC FISTULA CREATION RIGHT ARM;  Surgeon: Conrad Bee[place], MD;  Location: [place];  Service: Vascular;  Laterality: Right;  . Buttocks flap Left   . COLONOSCOPY    . EXTRACORPOREAL SHOCK WAVE LITHOTRIPSY  03/23/2011   x2  . FINGER SURGERY  2004  . hip flap Right   . INCISION AND DRAINAGE OF WOUND Left 09/05/2014   Procedure: IRRIGATION AND DEBRIDEMENT OF LEFT ISCHIUM WOUND WITH ;  Surgeon: Theodoro Kos, DO;  Location: [place];  Service: Plastics;  Laterality: Left;  . INSERTION OF DIALYSIS CATHETER N/A 02/27/2016   Procedure: INSERTION OF DIALYSIS CATHETER-RIGHT INTERNAL JUGULA PLACEMENT;  Surgeon: Conrad La[place] Bay, MD;  Location: [place];  Service: Vascular;  Laterality: N/A;  . TONSILLECTOMY    . WOUND DEBRIDEMENT     [facility]/2/[facility]  Family History  Problem Relation Age of Onset  . Stroke Father   . Diabetes Father   . Diabetes Paternal Grandmother   . Stroke Paternal Grandfather   . Heart disease Neg Hx   . Cancer Neg Hx    Social History:  reports that he quit smoking about 34 years ago. His smoking use included cigars. He has never used smokeless tobacco. He reports that he does not drink alcohol or use drugs.  ROS: As per HPI otherwise negative.  Physical Exam: Vitals:   09/05/17 0508 09/05/17 0515 09/05/17 0604 09/05/17 0948  BP:   134/64 130/80  Pulse:  77 84 87  Resp:  (!) 22 16 16   Temp: 98.6 F (37 C)  98.9 F (37.2 C) 98.7 F  (37.1 C)  TempSrc: Oral  Oral Oral  SpO2:  100% 98% 96%  Weight:      Height:         General: Frail man, NAD Head: Normocephalic, atraumatic, sclera non-icteric, mucus membranes are moist. Neck: Supple without lymphadenopathy/masses.  Lungs: Clear bilaterally to auscultation without wheezes, rales, or rhonchi. Breathing is unlabored. Heart: RRR with normal S1, S2. No murmurs, rubs, or gallops appreciated. Abdomen: Soft, non-tender, non-distended with normoactive bowel sounds. Musculoskeletal:  Strength and tone appear normal for age. Lower extremities: No LE edema. Heels with pressure bandages and cushion boots on. Neuro: Alert and oriented X 3.  Psych:  Responds to questions appropriately with a normal affect. Dialysis Access: AVF + thrill/bruit  No Known Allergies Prior to Admission medications   Medication Sig Start Date End Date Taking? Authorizing Provider  baclofen (LIORESAL) 10 MG tablet Take 1 tablet (10 mg total) by mouth at bedtime as needed for muscle spasms. 07/15/16  Yes Lauree Chandler, NP  carvedilol (COREG) 3.125 MG tablet Take 1 tablet (3.125 mg total) by mouth 2 (two) times daily with a meal. 07/15/16  Yes Eubanks, Carlos American, NP  insulin aspart (NOVOLOG FLEXPEN) 100 UNIT/ML FlexPen Inject 0-9 Units into the skin 3 (three) times daily with meals. Per sliding scale Patient taking differently: Inject 7 Units 3 (three) times daily with meals into the skin. Per sliding scale 07/20/16  Yes Lauree Chandler, NP  insulin detemir (LEVEMIR) 100 UNIT/ML injection Inject 9 Units into the skin daily with breakfast.   Yes [provider]  lidocaine-prilocaine (EMLA) cream Apply 1 application topically as needed. Patient taking differently: Apply 1 application topically as needed (for pain).  07/24/16  Yes Conrad Frontier, MD  Melatonin 3 MG TABS Take 3 mg by mouth at bedtime.   Yes [provider]  simvastatin (ZOCOR) 20 MG tablet Take 20 mg by mouth every evening.    Yes [provider]  Skin Protectants, Misc. (EUCERIN) cream Apply 1 application daily as needed topically for dry skin. For feet    Yes [provider]  Insulin Pen Needle (PEN NEEDLES) 30G X 8 MM MISC Inject 1 each into the skin 4 (four) times daily. . 07/20/16   Lauree Chandler, NP   Current Facility-Administered Medications  Medication Dose Route Frequency Provider Last Rate Last Dose  . acetaminophen (TYLENOL) tablet 650 mg  650 mg Oral Q6H PRN Fuller Plan A, MD       Or  . acetaminophen (TYLENOL) suppository 650 mg  650 mg Rectal Q6H PRN Smith, Rondell A, MD      . albuterol (PROVENTIL) (2.5 MG/3ML) 0.083% nebulizer solution 2.5 mg  2.5 mg Nebulization Q6H  PRN Norval Morton, MD      . baclofen (LIORESAL) tablet 10 mg  10 mg Oral QHS PRN Fuller Plan A, MD      . carvedilol (COREG) tablet 3.125 mg  3.125 mg Oral BID WC Smith, Rondell A, MD      . Derrill Memo ON 09/07/2017] ceFEPIme (MAXIPIME) 2 g in sodium chloride 0.9 % 100 mL IVPB  2 g Intravenous Q T,Th,Sa-HD Patterson Hammersmith, RPH      . hydrocerin (EUCERIN) cream 1 application  1 application Topical Daily PRN Smith, Rondell A, MD      . insulin aspart (novoLOG) injection 0-9 Units  0-9 Units Subcutaneous TID WC Smith, Rondell A, MD      . insulin detemir (LEVEMIR) injection 9 Units  9 Units Subcutaneous Q breakfast Smith, Rondell A, MD      . lidocaine-prilocaine (EMLA) cream 1 application  1 application Topical PRN Fuller Plan A, MD      . Melatonin TABS 3 mg  3 mg Oral QHS Smith, Rondell A, MD   3 mg at 09/05/17 0126  . ondansetron (ZOFRAN) tablet 4 mg  4 mg Oral Q6H PRN Fuller Plan A, MD       Or  . ondansetron (ZOFRAN) injection 4 mg  4 mg Intravenous Q6H PRN Tamala Julian, Rondell A, MD      . simvastatin (ZOCOR) tablet 20 mg  20 mg Oral QPM Norval Morton, MD       Labs: Basic Metabolic Panel: Recent Labs  Lab 09/04/17 1913 09/05/17 0301  NA 137 134*  K 4.8 4.3  CL 98* 98*  CO2 27 23  GLUCOSE 132*  96  BUN 52* 57*  CREATININE 4.76* 4.96*  CALCIUM 9.7 9.0  PHOS  --  5.1*   Liver Function Tests: Recent Labs  Lab 09/04/17 1913 09/05/17 0301  AST 14*  --   ALT 9*  --   ALKPHOS 69  --   BILITOT 0.8  --   PROT 8.5*  --   ALBUMIN 3.0* 2.7*   CBC: Recent Labs  Lab 09/04/17 1913 09/05/17 0301  WBC 7.9 8.3  NEUTROABS 7.3  --   HGB 12.4* 10.5*  HCT 39.4 33.4*  MCV 104.0* 103.1*  PLT 127* 122*   Studies/Results: Dg Chest 2 View  Result Date: 09/04/2017 CLINICAL DATA:  Sepsis. EXAM: CHEST - 2 VIEW COMPARISON:  06/09/2017 FINDINGS: Mildly enlarged cardiac silhouette. Calcific atherosclerotic disease of the aorta. Mediastinal contours appear intact. There is no evidence of lobar airspace consolidation, pleural effusion or pneumothorax. Diffusely increased interstitial markings and septal thickening. Osseous structures are without acute abnormality. Soft tissues are grossly normal. IMPRESSION: Probable interstitial pulmonary edema. No lobar consolidations seen. Electronically Signed   By: Fidela Salisbury M.D.   On: 09/04/2017 19:32   US Renal  Result Date: 09/05/2017 CLINICAL DATA:  77 year old male with hematuria. End-stage renal disease. EXAM: RENAL / URINARY TRACT ULTRASOUND COMPLETE COMPARISON:  Renal ultrasound dated 05/09/2016 FINDINGS: Right Kidney: Length: 10.9 cm. There is diffuse increased renal echogenicity. There is a 1.9 x 1.2 x 1.5 cm superior pole cyst. No hydronephrosis or shadowing stone. Left Kidney: Length: 10.5 cm. There is diffuse increased echogenicity. No hydronephrosis or shadowing stone. Bladder: A Foley catheter is noted in the partially distended urinary bladder. There is trabeculation of the bladder wall indicative of chronic bladder outlet obstruction. IMPRESSION: 1. Echogenic kidneys consistent with chronic kidney disease. Clinical correlation is recommended. 2. No hydronephrosis or shadowing  stone. 3. Trabeculated urinary bladder suggestive of chronic  bladder outlet obstruction. The bladder is partially distended around a Foley catheter. Electronically Signed   By: Anner Crete M.D.   On: 09/05/2017 01:15   Dialysis Orders:  TTS at [facility] 3:30hr, 400/800, EDW 74.5kg, 2K/2Ca, AVF, Profile 2, no heparin - Venofer 50mg  IV weekly - No ESA or VDRA recently (^ Ca)  Assessment/Plan: 1.  Sepsis/hematuria/UTI: UCx and BCx pending. On IV Cefepime. 2.  ESRD: Usually TTS schedule, missed last HD. K fine. No dyspnea despite CXR read as pulm edema. No O2 requirements at this time. Will plan on HD tomorrow as make-up and for volume, then back to TTS schedule.   3.  Hypertension/volume: See above. BP ok. CXR read as pulm edema, UF with next HD. 4.  Anemia: Hgb 10.5, no ESA yet. 5.  Metabolic bone disease: Corr Ca slightly high, Phos ok. No VRDA. Follow.  6.  Nutrition:  Alb low, adding pro-stat. 7.  MS (w/ paraplegia/neurogenic bladder and chronic foley) 8.  Type 2 DM  Veneta Penton, PA-C 09/05/2017, 9:56 AM  [facility] Pager: [phone]  Pt seen, examined and agree w A/P as above.  [name] MD [employer] pager [phone]   09/05/2017, 2:23 PM

## 2017-09-05 NOTE — Progress Notes (Signed)
PROGRESS NOTE  Lance [name] WPY:[id number] [name]: [birth year] DOA: 09/04/2017 PCP: Lavone Orn, MD  HPI/Recap of past 24 hours:  HPI: Lance [name] is a 77 y.o. male with ESRD, MS with neurogenic bladder with chronic foley, chronic paraplegia (d/t MS), and Type 2 DM who was admitted with sepsis/UTI.   Mr. Weatherly reported gross hematuria in foley bag and chills x 1 day prior to presentation. No known fever at home. Missed HD on Saturday d/t feeling poorly. Presented to ED on 5/4, found to be febrile to 103F. Labs showed K 4.8, WBC 7.9, Hgb 12.4, urine with frank blood present. Urine and blood Cx collected, pending. He was started on Cefepime given Hx last UTI Cx results which grew Klebsiella. CXR showed appearance of possible pulmonary edema. Feels ok at this time. No CP, dyspnea, N/V, abd pain    Assessment/Plan: Principal Problem:   Sepsis ([place]) Active Problems:   Multiple sclerosis ([place])   Complicated urinary tract infection   Dyslipidemia associated with type 2 diabetes mellitus (HCC)   Macrocytic anemia   Pressure injury of skin   ESRD on hemodialysis (HCC)   Thrombocytopenia (HCC)  Sepsis 2/2 catheter associated urinary tract infection: Acute.  Patient presents with fever up to 103.1 F and tachycardia.  Urinalysis is noted to be abnormal significantly bloody.  Suspect infection as likely cause of symptoms.  Given recent history of resistant Klebsiella in urine patient was empirically treated with cefepime. - Admit to a telemetry - Follow-up blood and urine cultures no growth after 24 hours - Continue empiric antibiotics of cefepime - remote history of MRSA - Tylenol prn fever    Hematuria: Acute.  Symptoms following recent Foley catheter exchange. - Check renal ultrasound   ESRD on HD: Patient normally dialyzes T/Th/Sat, but missed hemodialysis due to symptoms.  Labs are relatively stable with potassium of 4.8, BUN 36, and creatinine 4.76.  - Cardiology on  board patient will have hemodialysis in the morning  Pulmonary edema: Acute. Chest x-ray showing likely pulmonary edema.  Patient denies having any significant signs of shortness of breath. - Nasal cannula oxygen as needed overnight  Multiple sclerosis with nephrogenic bladder: Placed Patient is wheelchair-bound and utilizes a power wheelchair to get around. - Continue baclofen  Diabetes mellitus type 2 - Hypoglycemic protocol - Continue Levemir per home regimen - CBGs q. before meals with sensitive SSI  Macrocytic anemia: Chronic.  Hemoglobin 12.4 on admission which appears improved from previous. - Check vitamin B12 and folate in a.m.  Thrombocytopenia: Acute on chronic.  Patient has intermittently had low platelet counts previously in the past.  Platelet count 127 on admission. - Continue repeat CBC in a.m.  Hyperlipidemia - Continue simvastatin    DVT prophylaxis: SCDs  Code Status: DNR Family Communication: No family present at home Disposition Plan: Discharge home once medically    Consultants:  Nephrology  Procedures:  None  Antimicrobials:  Cefepime   Objective: Vitals:   09/05/17 0508 09/05/17 0515 09/05/17 0604 09/05/17 0948  BP:   134/64 130/80  Pulse:  77 84 87  Resp:  (!) 22 16 16   Temp: 98.6 F (37 C)  98.9 F (37.2 C) 98.7 F (37.1 C)  TempSrc: Oral  Oral Oral  SpO2:  100% 98% 96%  Weight:      Height:        Intake/Output Summary (Last 24 hours) at 09/05/2017 1459 Last data filed at 09/05/2017 0545 Gross per 24 hour  Intake -  Output 450 ml  Net -450 ml   Filed Weights   09/04/17 1856  Weight: 77.1 kg (170 lb)   Body mass index is 20.69 kg/m.  Exam:         General: Frail man, NAD Head: Normocephalic, atraumatic, sclera non-icteric, mucus membranes are moist. Neck: Supple without lymphadenopathy/masses.  Lungs: Clear bilaterally to auscultation without wheezes, rales, or rhonchi. Breathing is unlabored. Heart: RRR  with normal S1, S2. No murmurs, rubs, or gallops appreciated. Abdomen: Soft, non-tender, non-distended with normoactive bowel sounds. Musculoskeletal:  Strength and tone appear normal for age. Lower extremities: No LE edema. Heels with pressure bandages and cushion boots on. Neuro: Alert and oriented X 3.  Psych:  Responds to questions appropriately with a normal affect. Dialysis Access: AVF + thrill/bruit  No Known Allergies     Data Reviewed: CBC: Recent Labs  Lab 09/04/17 1913 09/05/17 0301  WBC 7.9 8.3  NEUTROABS 7.3  --   HGB 12.4* 10.5*  HCT 39.4 33.4*  MCV 104.0* 103.1*  PLT 127* 597*   Basic Metabolic Panel: Recent Labs  Lab 09/04/17 1913 09/05/17 0301  NA 137 134*  K 4.8 4.3  CL 98* 98*  CO2 27 23  GLUCOSE 132* 96  BUN 52* 57*  CREATININE 4.76* 4.96*  CALCIUM 9.7 9.0  PHOS  --  5.1*   GFR: Estimated Creatinine Clearance: 13.8 mL/min (A) (by C-G formula based on SCr of 4.96 mg/dL (H)). Liver Function Tests: Recent Labs  Lab 09/04/17 1913 09/05/17 0301  AST 14*  --   ALT 9*  --   ALKPHOS 69  --   BILITOT 0.8  --   PROT 8.5*  --   ALBUMIN 3.0* 2.7*   No results for input(s): LIPASE, AMYLASE in the last 168 hours. No results for input(s): AMMONIA in the last 168 hours. Coagulation Profile: Recent Labs  Lab 09/04/17 1913  INR 1.07   Cardiac Enzymes: No results for input(s): CKTOTAL, CKMB, CKMBINDEX, TROPONINI in the last 168 hours. BNP (last 3 results) No results for input(s): PROBNP in the last 8760 hours. HbA1C: No results for input(s): HGBA1C in the last 72 hours. CBG: Recent Labs  Lab 09/05/17 0949 09/05/17 1210  GLUCAP 178* 103*   Lipid Profile: No results for input(s): CHOL, HDL, LDLCALC, TRIG, CHOLHDL, LDLDIRECT in the last 72 hours. Thyroid Function Tests: No results for input(s): TSH, T4TOTAL, FREET4, T3FREE, THYROIDAB in the last 72 hours. Anemia Panel: Recent Labs    09/05/17 0301  VITAMINB12 366   Urine analysis:     Component Value Date/Time   COLORURINE RED (A) 09/04/2017 1955   APPEARANCEUR TURBID (A) 09/04/2017 1955   LABSPEC  09/04/2017 1955    TEST NOT REPORTED DUE TO COLOR INTERFERENCE OF URINE PIGMENT   PHURINE  09/04/2017 1955    TEST NOT REPORTED DUE TO COLOR INTERFERENCE OF URINE PIGMENT   GLUCOSEU (A) 09/04/2017 1955    TEST NOT REPORTED DUE TO COLOR INTERFERENCE OF URINE PIGMENT   HGBUR (A) 09/04/2017 1955    TEST NOT REPORTED DUE TO COLOR INTERFERENCE OF URINE PIGMENT   BILIRUBINUR (A) 09/04/2017 1955    TEST NOT REPORTED DUE TO COLOR INTERFERENCE OF URINE PIGMENT   KETONESUR (A) 09/04/2017 1955    TEST NOT REPORTED DUE TO COLOR INTERFERENCE OF URINE PIGMENT   PROTEINUR (A) 09/04/2017 1955    TEST NOT REPORTED DUE TO COLOR INTERFERENCE OF URINE PIGMENT   UROBILINOGEN 0.2 08/30/2014 2203   NITRITE (A)  09/04/2017 1955    TEST NOT REPORTED DUE TO COLOR INTERFERENCE OF URINE PIGMENT   LEUKOCYTESUR (A) 09/04/2017 1955    TEST NOT REPORTED DUE TO COLOR INTERFERENCE OF URINE PIGMENT   Sepsis Labs: @LABRCNTIP (procalcitonin:4,lacticidven:4)  ) Recent Results (from the past 240 hour(s))  Culture, blood (Routine x 2)     Status: None (Preliminary result)   Collection Time: 09/04/17  7:13 PM  Result Value Ref Range Status   Specimen Description BLOOD BLOOD LEFT FOREARM  Final   Special Requests   Final    BOTTLES DRAWN AEROBIC AND ANAEROBIC Blood Culture adequate volume   Culture   Final    NO GROWTH < 24 HOURS Performed at [facility], [place] [address], [place], [place] [address]    Report Status PENDING  Incomplete  Culture, blood (Routine x 2)     Status: None (Preliminary result)   Collection Time: 09/04/17  9:37 PM  Result Value Ref Range Status   Specimen Description BLOOD LEFT HAND  Final   Special Requests   Final    BOTTLES DRAWN AEROBIC AND ANAEROBIC Blood Culture adequate volume   Culture   Final    NO GROWTH < 12 HOURS Performed at [facility], [place] [address] [place],  [address]    Report Status PENDING  Incomplete      Studies: Dg Chest 2 View  Result Date: 09/04/2017 CLINICAL DATA:  Sepsis. EXAM: CHEST - 2 VIEW COMPARISON:  06/09/2017 FINDINGS: Mildly enlarged cardiac silhouette. Calcific atherosclerotic disease of the aorta. Mediastinal contours appear intact. There is no evidence of lobar airspace consolidation, pleural effusion or pneumothorax. Diffusely increased interstitial markings and septal thickening. Osseous structures are without acute abnormality. Soft tissues are grossly normal. IMPRESSION: Probable interstitial pulmonary edema. No lobar consolidations seen. Electronically Signed   By: Fidela Salisbury M.D.   On: 09/04/2017 19:32   US Renal  Result Date: 09/05/2017 CLINICAL DATA:  78 year old male with hematuria. End-stage renal disease. EXAM: RENAL / URINARY TRACT ULTRASOUND COMPLETE COMPARISON:  Renal ultrasound dated 05/09/2016 FINDINGS: Right Kidney: Length: 10.9 cm. There is diffuse increased renal echogenicity. There is a 1.9 x 1.2 x 1.5 cm superior pole cyst. No hydronephrosis or shadowing stone. Left Kidney: Length: 10.5 cm. There is diffuse increased echogenicity. No hydronephrosis or shadowing stone. Bladder: A Foley catheter is noted in the partially distended urinary bladder. There is trabeculation of the bladder wall indicative of chronic bladder outlet obstruction. IMPRESSION: 1. Echogenic kidneys consistent with chronic kidney disease. Clinical correlation is recommended. 2. No hydronephrosis or shadowing stone. 3. Trabeculated urinary bladder suggestive of chronic bladder outlet obstruction. The bladder is partially distended around a Foley catheter. Electronically Signed   By: Anner Crete M.D.   On: 09/05/2017 01:15    Scheduled Meds: . carvedilol  3.125 mg Oral BID WC  . feeding supplement (PRO-STAT SUGAR FREE 64)  30 mL Oral BID  . insulin aspart  0-9 Units Subcutaneous TID WC  . insulin detemir  9  Units Subcutaneous Q breakfast  . Melatonin  3 mg Oral QHS  . simvastatin  20 mg Oral QPM    Continuous Infusions: . [START ON 09/06/2017] ceFEPime (MAXIPIME) IV       LOS: 1 day     Cristal Deer, MD [facility] Pager [phone] To reach me or the doctor on call, go to: [email or web address] Password [email or web address]  09/05/2017, 2:59 PM

## 2017-09-06 ENCOUNTER — Encounter (HOSPITAL_BASED_OUTPATIENT_CLINIC_OR_DEPARTMENT_OTHER): Payer: Medicare Other | Attending: Internal Medicine

## 2017-09-06 DIAGNOSIS — G35 Multiple sclerosis: Secondary | ICD-10-CM | POA: Insufficient documentation

## 2017-09-06 DIAGNOSIS — N186 End stage renal disease: Secondary | ICD-10-CM | POA: Insufficient documentation

## 2017-09-06 DIAGNOSIS — L89314 Pressure ulcer of right buttock, stage 4: Secondary | ICD-10-CM | POA: Insufficient documentation

## 2017-09-06 DIAGNOSIS — L89893 Pressure ulcer of other site, stage 3: Secondary | ICD-10-CM | POA: Insufficient documentation

## 2017-09-06 DIAGNOSIS — G8220 Paraplegia, unspecified: Secondary | ICD-10-CM | POA: Insufficient documentation

## 2017-09-06 DIAGNOSIS — I132 Hypertensive heart and chronic kidney disease with heart failure and with stage 5 chronic kidney disease, or end stage renal disease: Secondary | ICD-10-CM | POA: Insufficient documentation

## 2017-09-06 DIAGNOSIS — I509 Heart failure, unspecified: Secondary | ICD-10-CM | POA: Insufficient documentation

## 2017-09-06 DIAGNOSIS — E1122 Type 2 diabetes mellitus with diabetic chronic kidney disease: Secondary | ICD-10-CM | POA: Insufficient documentation

## 2017-09-06 LAB — BLOOD CULTURE ID PANEL (REFLEXED)
Acinetobacter baumannii: NOT DETECTED
Candida albicans: NOT DETECTED
Candida glabrata: NOT DETECTED
Candida krusei: NOT DETECTED
Candida parapsilosis: NOT DETECTED
Candida tropicalis: NOT DETECTED
ESCHERICHIA COLI: NOT DETECTED
Enterobacter cloacae complex: NOT DETECTED
Enterobacteriaceae species: NOT DETECTED
Enterococcus species: NOT DETECTED
Haemophilus influenzae: NOT DETECTED
Klebsiella oxytoca: NOT DETECTED
Klebsiella pneumoniae: NOT DETECTED
Listeria monocytogenes: NOT DETECTED
Neisseria meningitidis: NOT DETECTED
PROTEUS SPECIES: NOT DETECTED
PSEUDOMONAS AERUGINOSA: NOT DETECTED
STAPHYLOCOCCUS SPECIES: NOT DETECTED
STREPTOCOCCUS PNEUMONIAE: NOT DETECTED
STREPTOCOCCUS PYOGENES: NOT DETECTED
Serratia marcescens: NOT DETECTED
Staphylococcus aureus (BCID): NOT DETECTED
Streptococcus agalactiae: NOT DETECTED
Streptococcus species: NOT DETECTED

## 2017-09-06 LAB — RENAL FUNCTION PANEL
ANION GAP: 12 (ref 5–15)
Albumin: 2.5 g/dL — ABNORMAL LOW (ref 3.5–5.0)
BUN: 73 mg/dL — ABNORMAL HIGH (ref 6–20)
CHLORIDE: 98 mmol/L — AB (ref 101–111)
CO2: 21 mmol/L — ABNORMAL LOW (ref 22–32)
CREATININE: 5.75 mg/dL — AB (ref 0.61–1.24)
Calcium: 8.9 mg/dL (ref 8.9–10.3)
GFR, EST AFRICAN AMERICAN: 10 mL/min — AB (ref >60)
GFR, EST NON AFRICAN AMERICAN: 9 mL/min — AB (ref >60)
Glucose, Bld: 141 mg/dL — ABNORMAL HIGH (ref 65–99)
POTASSIUM: 3.9 mmol/L (ref 3.5–5.1)
Phosphorus: 5.3 mg/dL — ABNORMAL HIGH (ref 2.5–4.6)
Sodium: 131 mmol/L — ABNORMAL LOW (ref 135–145)

## 2017-09-06 LAB — CBC
HEMATOCRIT: 32 % — AB (ref 39.0–52.0)
Hemoglobin: 10.1 g/dL — ABNORMAL LOW (ref 13.0–17.0)
MCH: 32.2 pg (ref 26.0–34.0)
MCHC: 31.6 g/dL (ref 30.0–36.0)
MCV: 101.9 fL — AB (ref 78.0–100.0)
PLATELETS: 139 10*3/uL — AB (ref 150–400)
RBC: 3.14 MIL/uL — AB (ref 4.22–5.81)
RDW: 15.2 % (ref 11.5–15.5)
WBC: 5.5 10*3/uL (ref 4.0–10.5)

## 2017-09-06 LAB — FOLATE RBC
FOLATE, RBC: 1452 ng/mL (ref >498)
Folate, Hemolysate: 457.5 ng/mL
HEMATOCRIT: 31.5 % — AB (ref 37.5–51.0)

## 2017-09-06 LAB — GLUCOSE, CAPILLARY
GLUCOSE-CAPILLARY: 88 mg/dL (ref 65–99)
Glucose-Capillary: 91 mg/dL (ref 65–99)
Glucose-Capillary: 227 mg/dL — ABNORMAL HIGH (ref 65–99)
Glucose-Capillary: 108 mg/dL — ABNORMAL HIGH (ref 65–99)

## 2017-09-06 MED ORDER — SODIUM CHLORIDE 0.9 % IV SOLN: 100.0000 mL | INTRAVENOUS | Status: DC | PRN

## 2017-09-06 MED ORDER — SODIUM CHLORIDE 0.9 % IV SOLN
2.0000 g | INTRAVENOUS | Status: AC
  Administered 2017-09-07: 2 g via INTRAVENOUS
  Filled 2017-09-06: qty 2

## 2017-09-06 MED ORDER — LIDOCAINE HCL (PF) 1 % IJ SOLN: 5.0000 mL | INTRAMUSCULAR | Status: DC | PRN

## 2017-09-06 MED ORDER — SODIUM CHLORIDE 0.9 % IV SOLN
2.0000 g | Freq: Once | INTRAVENOUS | Status: AC
  Administered 2017-09-06: 2 g via INTRAVENOUS
  Filled 2017-09-06 (×2): qty 2

## 2017-09-06 MED ORDER — LIDOCAINE-PRILOCAINE 2.5-2.5 % EX CREA: 1.0000 "application " | TOPICAL_CREAM | CUTANEOUS | Status: DC | PRN

## 2017-09-06 MED ORDER — PENTAFLUOROPROP-TETRAFLUOROETH EX AERO: 1.0000 "application " | INHALATION_SPRAY | CUTANEOUS | Status: DC | PRN

## 2017-09-06 NOTE — Progress Notes (Signed)
Pharmacy Antibiotic Note  Lance [name] is a 77 y.o. male admitted on 09/04/2017 with urosepsis.  Pharmacy has been consulted for cefepime dosing.   He is ESRD on HD usually TThS- missed one session PTA, going today as a make-up and then will likely go back on schedule.  Plan: Cefepime 2g IV x1 after HD today, then 2g IV qTTSat @ 1800 starting 5/7 Follow HD schedule/tolerance for any changes to above Follow c/s, clinical progression, LOT  Height: 6\' 4"  (193 cm) Weight: 165 lb 12.8 oz (75.2 kg) IBW/kg (Calculated) : 86.8  Temp (24hrs), Avg:98.4 F (36.9 C), Min:97.9 F (36.6 C), Max:99 F (37.2 C)  Recent Labs  Lab 09/04/17 1913 09/04/17 1927 09/04/17 2144 09/05/17 0301  WBC 7.9  --   --  8.3  CREATININE 4.76*  --   --  4.96*  LATICACIDVEN  --  1.83 1.13  --     Estimated Creatinine Clearance: 13.5 mL/min (A) (by C-G formula based on SCr of 4.96 mg/dL (H)).    No Known Allergies  Cefepime 5/4>>  5/4 urine: multiple species 5/4 BCx: 1/2 GPR 5/4 BCID: pending  Thank you for allowing pharmacy to be a part of this patient's care.  Terrye Dombrosky D. Malayla Granberry, PharmD, BCPS Clinical Pharmacist Clinical Phone for 09/06/2017 until 3:30pm: [phone] If after 3:30pm, please call main pharmacy at [phone] 09/06/2017 8:31 AM

## 2017-09-06 NOTE — Consult Note (Addendum)
[facility] Nurse wound consult note Reason for Consult: Consult requested for BLE and right ischium.  Pt had a Vac in the past to right ischium and it has significantly improved according to the patient, and Vac was discontinued "awhile ago".  He is followed by the outpatient wound care center and they have been using [facility], which is not available in the [facility].  Discussed this with the patient and he verbalized understanding and agrees to use Aquacel while in the hospital as a substitution. Wound type: Left outer leg with full thickness chronic wound; 2X.3X.2cm, red and moist, no odor, mod amt tan drainage. Right ischium with stage 3 pressure injury; 2X1.7X.3cm, red and moist, no odor, mod amt tan drainage.   Left heel with patchy areas of previous partial thickness skin loss; these are almost healed, largest site is .2X.2X.1cm, red and dry.   Right heel with stage 1 pressure injury; 2X1cm, red nonblanchable intact skin Dressing procedure/placement/frequency: Aquacel to absorb drainage and provide antimicrobial benefits to left leg and right ischium wounds.  Prevalon boots in place to reduce pressure to bilat heels. He is on a low-airloss bed to reduce pressure. Pt states he will followup with the outpatient wound care center after discharge. Please re-consult if further assistance is needed.  Thank-you,  Julien Girt MSN, [place], [place], [place], [place]

## 2017-09-06 NOTE — Progress Notes (Signed)
  Lance [name] Progress Note    Subjective:   No new complaints.  Admits that he was not taking trimethoprim for the last month prior to recent UTI.   Objective:   BP 135/60 (BP Location: Left Arm)   Pulse 80   Temp 97.9 F (36.6 C) (Oral)   Resp 17   Ht 6\' 4"  (1.93 m)   Wt 75.2 kg (165 lb 12.8 oz)   SpO2 97%   BMI 20.18 kg/m   Intake/Output: I/O last 3 completed shifts: In: 960 [P.O.:960] Out: 850 [Urine:850]   Intake/Output this shift:  No intake/output data recorded. Weight change:   Physical Exam: Gen:NAD CVS: no rub Resp: cta XBW:IOMBTD Ext: no edema, LUE AVF +T/B.   Labs: BMET Recent Labs  Lab 09/04/17 1913 09/05/17 0301  NA 137 134*  K 4.8 4.3  CL 98* 98*  CO2 27 23  GLUCOSE 132* 96  BUN 52* 57*  CREATININE 4.76* 4.96*  ALBUMIN 3.0* 2.7*  CALCIUM 9.7 9.0  PHOS  --  5.1*   CBC Recent Labs  Lab 09/04/17 1913 09/05/17 0301  WBC 7.9 8.3  NEUTROABS 7.3  --   HGB 12.4* 10.5*  HCT 39.4 33.4*  MCV 104.0* 103.1*  PLT 127* 122*    @IMGRELPRIORS @ Medications:    . carvedilol  3.125 mg Oral BID WC  . feeding supplement (PRO-STAT SUGAR FREE 64)  30 mL Oral BID  . insulin aspart  0-9 Units Subcutaneous TID WC  . insulin detemir  9 Units Subcutaneous Q breakfast  . Melatonin  3 mg Oral QHS  . simvastatin  20 mg Oral QPM   Dialysis Orders:  TTS at [facility] 3:30hr, 400/800, EDW 74.5kg, 2K/2Ca, AVF, Profile 2, no heparin - Venofer 50mg  IV weekly - No ESA or VDRA recently (^ Ca)   Assessment/ Plan:   1. Sepsis/hematuria/UTI- 1/2 blood cx's + for GNR's, Urine with multiple organisms.   Continue with IV Cefepime and follow 2. ESRD off schedule due to missed HD.  Will get back on TTS this week. 3. Anemia: no ESA 4. CKD-MBD: stable 5. Nutrition: renal diet 6. Hypertension: stable 7. MS with paraplegia/neurogenic bladder 8. DM type 2- per primary sv c  Lance Potts, MD [place] Pager [phone] 09/06/2017, 8:48 AM

## 2017-09-06 NOTE — Progress Notes (Signed)
PROGRESS NOTE    Lance [name]  GHW:[id number] [name]: Oct 14, 1940 DOA: 09/04/2017 PCP: Lavone Orn, MD   Brief Narrative: Lance [name] is a 77 y.o. male with a history of ESRD, MS, neurogenic bladder, hypertension, chronic foley, paraplegia, diabetes mellitus, type 2 on insulin. He presented secondary to gross hematuria and chills. He was admitted for concern for sepsis from urinary source. He is on Cefepime empirically with blood cultures pending (1/4 positive for GPR). Urine culture with multiple species.   Assessment & Plan:   Principal Problem:   Sepsis ([place]) Active Problems:   Multiple sclerosis ([place])   Complicated urinary tract infection   Dyslipidemia associated with type 2 diabetes mellitus (HCC)   Macrocytic anemia   Pressure injury of skin   ESRD on hemodialysis (HCC)   Thrombocytopenia (HCC)   Sepsis Presumed secondary to catheter associated urinary tract infection. Urine culture with multiple species. Blood culture positive (1/4) for gram positive rods. History of resistant Klebsiella. -Continue Cefepime -Blood cultures  Hematuria Thought secondary to acute infection.  ESRD On HD, T/Th/Saturday -Per nephrology  Pulmonary edema Symptoms improved. Secondary to missed HD  Multiple sclerosis -Continue Baclofen -Palliative care consult for goals of care  Nephrogenic bladder Chronic foley with multiple CAUTIs.  Diabetes mellitus, type 2 Adequate fasting blood sugar -Continue Levemir and SSI  Macrocytic anemia Anemia of chronic disease Vitamin B12 normal. History of chronic disease secondary to kidney disease  Thrombocytopenia In setting of acute infection. Likely reactive. Improved today.  Hyperlipidemia -Continue simvastatin  Essential hypertension -Continue Coreg     DVT prophylaxis: SCDs Code Status:   Code Status: DNR Family Communication: None at bedside Disposition Plan: Discharge likely home   Consultants:    Nephrology  Procedures:   Hemodialysis  Antimicrobials:  Cefepime (5/4>>   Subjective: Patient reports no issues. Afebrile overnight.;  Objective: Vitals:   09/06/17 0915 09/06/17 0945 09/06/17 1015 09/06/17 1045  BP: (!) 125/56 (!) 134/59 (!) 126/54 136/73  Pulse: 65 66 65 65  Resp:    18  Temp:    98.1 F (36.7 C)  TempSrc:    Oral  SpO2:    95%  Weight:    73.8 kg (162 lb 11.2 oz)  Height:        Intake/Output Summary (Last 24 hours) at 09/06/2017 1117 Last data filed at 09/06/2017 1045 Gross per 24 hour  Intake 720 ml  Output 1400 ml  Net -680 ml   Filed Weights   09/04/17 1856 09/06/17 0715 09/06/17 1045  Weight: 77.1 kg (170 lb) 75.2 kg (165 lb 12.8 oz) 73.8 kg (162 lb 11.2 oz)    Examination:  General exam: Appears calm and comfortable.  Respiratory system: Clear but diminished to auscultation anteriorly. Respiratory effort normal. Cardiovascular system: S1 & S2 heard, RRR. Gastrointestinal system: Abdomen is nondistended, soft and nontender. Normal bowel sounds heard. Central nervous system: Alert and oriented. Extremities: No edema. No calf tenderness Skin: No cyanosis. No rashes Psychiatry: Judgement and insight appear normal. Mood & affect appropriate.     Data Reviewed: I have personally reviewed following labs and imaging studies  CBC: Recent Labs  Lab 09/04/17 1913 09/05/17 0301 09/06/17 0824  WBC 7.9 8.3 5.5  NEUTROABS 7.3  --   --   HGB 12.4* 10.5* 10.1*  HCT 39.4 33.4* 32.0*  MCV 104.0* 103.1* 101.9*  PLT 127* 122* 789*   Basic Metabolic Panel: Recent Labs  Lab 09/04/17 1913 09/05/17 0301 09/06/17 0824  NA 137 134*  131*  K 4.8 4.3 3.9  CL 98* 98* 98*  CO2 27 23 21*  GLUCOSE 132* 96 141*  BUN 52* 57* 73*  CREATININE 4.76* 4.96* 5.75*  CALCIUM 9.7 9.0 8.9  PHOS  --  5.1* 5.3*   GFR: Estimated Creatinine Clearance: 11.4 mL/min (A) (by C-G formula based on SCr of 5.75 mg/dL (H)). Liver Function Tests: Recent Labs   Lab 09/04/17 1913 09/05/17 0301 09/06/17 0824  AST 14*  --   --   ALT 9*  --   --   ALKPHOS 69  --   --   BILITOT 0.8  --   --   PROT 8.5*  --   --   ALBUMIN 3.0* 2.7* 2.5*   No results for input(s): LIPASE, AMYLASE in the last 168 hours. No results for input(s): AMMONIA in the last 168 hours. Coagulation Profile: Recent Labs  Lab 09/04/17 1913  INR 1.07   Cardiac Enzymes: No results for input(s): CKTOTAL, CKMB, CKMBINDEX, TROPONINI in the last 168 hours. BNP (last 3 results) No results for input(s): PROBNP in the last 8760 hours. HbA1C: No results for input(s): HGBA1C in the last 72 hours. CBG: Recent Labs  Lab 09/05/17 0949 09/05/17 1210 09/05/17 1636 09/05/17 2059 09/06/17 0513  GLUCAP 178* 103* 105* 147* 91   Lipid Profile: No results for input(s): CHOL, HDL, LDLCALC, TRIG, CHOLHDL, LDLDIRECT in the last 72 hours. Thyroid Function Tests: No results for input(s): TSH, T4TOTAL, FREET4, T3FREE, THYROIDAB in the last 72 hours. Anemia Panel: Recent Labs    09/05/17 0301  VITAMINB12 366   Sepsis Labs: Recent Labs  Lab 09/04/17 1927 09/04/17 2144  LATICACIDVEN 1.83 1.13    Recent Results (from the past 240 hour(s))  Urine culture     Status: Abnormal   Collection Time: 09/04/17  6:59 PM  Result Value Ref Range Status   Specimen Description URINE, CATHETERIZED  Final   Special Requests   Final    NONE Performed at [facility], [place] [address] [place], [place] [address]    Culture MULTIPLE SPECIES PRESENT, SUGGEST RECOLLECTION (A)  Final   Report Status 09/05/2017 FINAL  Final  Culture, blood (Routine x 2)     Status: None (Preliminary result)   Collection Time: 09/04/17  7:13 PM  Result Value Ref Range Status   Specimen Description BLOOD BLOOD LEFT FOREARM  Final   Special Requests   Final    BOTTLES DRAWN AEROBIC AND ANAEROBIC Blood Culture adequate volume   Culture  Setup Time   Final    GRAM POSITIVE RODS AEROBIC BOTTLE ONLY CRITICAL  RESULT CALLED TO, READ BACK BY AND VERIFIED WITH: E SINCLAIR,PHARMD AT 0940 09/06/17 BY L BENFIELD Performed at [facility], [place] [address], [place], [place] [address]    Culture GRAM POSITIVE RODS  Final   Report Status PENDING  Incomplete  Blood Culture ID Panel (Reflexed)     Status: None   Collection Time: 09/04/17  7:13 PM  Result Value Ref Range Status   Enterococcus species NOT DETECTED NOT DETECTED Final   Listeria monocytogenes NOT DETECTED NOT DETECTED Final   Staphylococcus species NOT DETECTED NOT DETECTED Final   Staphylococcus aureus NOT DETECTED NOT DETECTED Final   Streptococcus species NOT DETECTED NOT DETECTED Final   Streptococcus agalactiae NOT DETECTED NOT DETECTED Final   Streptococcus pneumoniae NOT DETECTED NOT DETECTED Final   Streptococcus pyogenes NOT DETECTED NOT DETECTED Final   Acinetobacter baumannii NOT DETECTED  NOT DETECTED Final   Enterobacteriaceae species NOT DETECTED NOT DETECTED Final   Enterobacter cloacae complex NOT DETECTED NOT DETECTED Final   Escherichia coli NOT DETECTED NOT DETECTED Final   Klebsiella oxytoca NOT DETECTED NOT DETECTED Final   Klebsiella pneumoniae NOT DETECTED NOT DETECTED Final   Proteus species NOT DETECTED NOT DETECTED Final   Serratia marcescens NOT DETECTED NOT DETECTED Final   Haemophilus influenzae NOT DETECTED NOT DETECTED Final   Neisseria meningitidis NOT DETECTED NOT DETECTED Final   Pseudomonas aeruginosa NOT DETECTED NOT DETECTED Final   Candida albicans NOT DETECTED NOT DETECTED Final   Candida glabrata NOT DETECTED NOT DETECTED Final   Candida krusei NOT DETECTED NOT DETECTED Final   Candida parapsilosis NOT DETECTED NOT DETECTED Final   Candida tropicalis NOT DETECTED NOT DETECTED Final    Comment: Performed at [facility], [place] [address], [place], [place] [address]  Culture, blood (Routine x 2)     Status: None (Preliminary result)   Collection Time: 09/04/17  9:37 PM  Result Value Ref Range  Status   Specimen Description BLOOD LEFT HAND  Final   Special Requests   Final    BOTTLES DRAWN AEROBIC AND ANAEROBIC Blood Culture adequate volume   Culture   Final    NO GROWTH 2 DAYS Performed at [facility], [place] [address] [place], [place] [address]    Report Status PENDING  Incomplete         Radiology Studies: Dg Chest 2 View  Result Date: 09/04/2017 CLINICAL DATA:  Sepsis. EXAM: CHEST - 2 VIEW COMPARISON:  06/09/2017 FINDINGS: Mildly enlarged cardiac silhouette. Calcific atherosclerotic disease of the aorta. Mediastinal contours appear intact. There is no evidence of lobar airspace consolidation, pleural effusion or pneumothorax. Diffusely increased interstitial markings and septal thickening. Osseous structures are without acute abnormality. Soft tissues are grossly normal. IMPRESSION: Probable interstitial pulmonary edema. No lobar consolidations seen. Electronically Signed   By: Fidela Salisbury M.D.   On: 09/04/2017 19:32   US Renal  Result Date: 09/05/2017 CLINICAL DATA:  78 year old male with hematuria. End-stage renal disease. EXAM: RENAL / URINARY TRACT ULTRASOUND COMPLETE COMPARISON:  Renal ultrasound dated 05/09/2016 FINDINGS: Right Kidney: Length: 10.9 cm. There is diffuse increased renal echogenicity. There is a 1.9 x 1.2 x 1.5 cm superior pole cyst. No hydronephrosis or shadowing stone. Left Kidney: Length: 10.5 cm. There is diffuse increased echogenicity. No hydronephrosis or shadowing stone. Bladder: A Foley catheter is noted in the partially distended urinary bladder. There is trabeculation of the bladder wall indicative of chronic bladder outlet obstruction. IMPRESSION: 1. Echogenic kidneys consistent with chronic kidney disease. Clinical correlation is recommended. 2. No hydronephrosis or shadowing stone. 3. Trabeculated urinary bladder suggestive of chronic bladder outlet obstruction. The bladder is partially distended around a Foley catheter. Electronically  Signed   By: Anner Crete M.D.   On: 09/05/2017 01:15        Scheduled Meds: . carvedilol  3.125 mg Oral BID WC  . feeding supplement (PRO-STAT SUGAR FREE 64)  30 mL Oral BID  . insulin aspart  0-9 Units Subcutaneous TID WC  . insulin detemir  9 Units Subcutaneous Q breakfast  . Melatonin  3 mg Oral QHS  . simvastatin  20 mg Oral QPM   Continuous Infusions: . sodium chloride    . sodium chloride    . [START ON 09/07/2017] ceFEPime (MAXIPIME) IV       LOS: 2 days  Cordelia Poche, MD [facility] 09/06/2017, 11:17 AM Pager: [phone]  If 7PM-7AM, please contact night-coverage [email or web address] 09/06/2017, 11:17 AM

## 2017-09-06 NOTE — Progress Notes (Signed)
PHARMACY - PHYSICIAN COMMUNICATION CRITICAL VALUE ALERT - BLOOD CULTURE IDENTIFICATION (BCID)  Lance [name] is an 77 y.o. male who presented to [facility] on 09/04/2017 with a chief complaint of hematuria.   Assessment:  77 year old male with ESRD on HD. Noted blood on exchange of foley catheter.  Had associated symptoms of chills, malaise. Started on cefepime for urinary tract infection. Urine culture in 03/2017 showed klebsiella. Urine culture on this admission shows multiple species. Blood cultures are showing gram positive rods in 1/4 - Nothing detected on BCID. WBC count has been within normal limits and patient has been afebrile for 24 hours.   Name of physician (or Provider) ContactedLonny Prude  Current antibiotics: Cefepime  Changes to prescribed antibiotics recommended:  On cefepime for possible urinary infection. Would not add antibiotics based on blood culture alone. Could be possible contaminant. Monitor cultures and patient clinical status.   Results for orders placed or performed during the hospital encounter of 09/04/17  Blood Culture ID Panel (Reflexed) (Collected: 09/04/2017  7:13 PM)  Result Value Ref Range   Enterococcus species NOT DETECTED NOT DETECTED   Listeria monocytogenes NOT DETECTED NOT DETECTED   Staphylococcus species NOT DETECTED NOT DETECTED   Staphylococcus aureus NOT DETECTED NOT DETECTED   Streptococcus species NOT DETECTED NOT DETECTED   Streptococcus agalactiae NOT DETECTED NOT DETECTED   Streptococcus pneumoniae NOT DETECTED NOT DETECTED   Streptococcus pyogenes NOT DETECTED NOT DETECTED   Acinetobacter baumannii NOT DETECTED NOT DETECTED   Enterobacteriaceae species NOT DETECTED NOT DETECTED   Enterobacter cloacae complex NOT DETECTED NOT DETECTED   Escherichia coli NOT DETECTED NOT DETECTED   Klebsiella oxytoca NOT DETECTED NOT DETECTED   Klebsiella pneumoniae NOT DETECTED NOT DETECTED   Proteus species NOT DETECTED NOT DETECTED   Serratia  marcescens NOT DETECTED NOT DETECTED   Haemophilus influenzae NOT DETECTED NOT DETECTED   Neisseria meningitidis NOT DETECTED NOT DETECTED   Pseudomonas aeruginosa NOT DETECTED NOT DETECTED   Candida albicans NOT DETECTED NOT DETECTED   Candida glabrata NOT DETECTED NOT DETECTED   Candida krusei NOT DETECTED NOT DETECTED   Candida parapsilosis NOT DETECTED NOT DETECTED   Candida tropicalis NOT DETECTED NOT DETECTED   Jimmy Footman, PharmD, BCPS PGY2 Infectious Diseases Pharmacy Resident Pager: [phone]  09/06/2017  9:51 AM

## 2017-09-07 DIAGNOSIS — Z7189 Other specified counseling: Secondary | ICD-10-CM

## 2017-09-07 DIAGNOSIS — G35 Multiple sclerosis: Secondary | ICD-10-CM

## 2017-09-07 DIAGNOSIS — N390 Urinary tract infection, site not specified: Secondary | ICD-10-CM

## 2017-09-07 DIAGNOSIS — Z515 Encounter for palliative care: Secondary | ICD-10-CM

## 2017-09-07 LAB — CBC
HCT: 32.4 % — ABNORMAL LOW (ref 39.0–52.0)
Hemoglobin: 10.3 g/dL — ABNORMAL LOW (ref 13.0–17.0)
MCH: 32.4 pg (ref 26.0–34.0)
MCHC: 31.8 g/dL (ref 30.0–36.0)
MCV: 101.9 fL — ABNORMAL HIGH (ref 78.0–100.0)
Platelets: 134 K/uL — ABNORMAL LOW (ref 150–400)
RBC: 3.18 MIL/uL — ABNORMAL LOW (ref 4.22–5.81)
RDW: 14.6 % (ref 11.5–15.5)
WBC: 5.1 10*3/uL (ref 4.0–10.5)

## 2017-09-07 LAB — RENAL FUNCTION PANEL
Albumin: 2.5 g/dL — ABNORMAL LOW (ref 3.5–5.0)
Anion gap: 11 (ref 5–15)
BUN: 40 mg/dL — ABNORMAL HIGH (ref 6–20)
CO2: 26 mmol/L (ref 22–32)
Calcium: 9.3 mg/dL (ref 8.9–10.3)
Chloride: 101 mmol/L (ref 101–111)
Creatinine, Ser: 3.9 mg/dL — ABNORMAL HIGH (ref 0.61–1.24)
GFR calc Af Amer: 16 mL/min — ABNORMAL LOW (ref >60)
GFR calc non Af Amer: 14 mL/min — ABNORMAL LOW (ref >60)
Glucose, Bld: 196 mg/dL — ABNORMAL HIGH (ref 65–99)
Phosphorus: 4.7 mg/dL — ABNORMAL HIGH (ref 2.5–4.6)
Potassium: 4 mmol/L (ref 3.5–5.1)
Sodium: 138 mmol/L (ref 135–145)

## 2017-09-07 LAB — GLUCOSE, CAPILLARY
GLUCOSE-CAPILLARY: 117 mg/dL — AB (ref 65–99)
GLUCOSE-CAPILLARY: 241 mg/dL — AB (ref 65–99)
Glucose-Capillary: 102 mg/dL — ABNORMAL HIGH (ref 65–99)
Glucose-Capillary: 247 mg/dL — ABNORMAL HIGH (ref 65–99)

## 2017-09-07 LAB — MRSA PCR SCREENING: MRSA by PCR: POSITIVE — AB

## 2017-09-07 MED ORDER — LIDOCAINE HCL (PF) 1 % IJ SOLN: 5.0000 mL | INTRAMUSCULAR | Status: DC | PRN

## 2017-09-07 MED ORDER — PENTAFLUOROPROP-TETRAFLUOROETH EX AERO: 1.0000 "application " | INHALATION_SPRAY | CUTANEOUS | Status: DC | PRN

## 2017-09-07 MED ORDER — SODIUM CHLORIDE 0.9 % IV SOLN: 100.0000 mL | INTRAVENOUS | Status: DC | PRN

## 2017-09-07 MED ORDER — CHLORHEXIDINE GLUCONATE CLOTH 2 % EX PADS
6.0000 | MEDICATED_PAD | Freq: Every day | CUTANEOUS | Status: DC
  Administered 2017-09-07 – 2017-09-08 (×2): 6 via TOPICAL

## 2017-09-07 MED ORDER — MUPIROCIN 2 % EX OINT
1.0000 "application " | TOPICAL_OINTMENT | Freq: Two times a day (BID) | CUTANEOUS | Status: DC
  Administered 2017-09-07 – 2017-09-09 (×5): 1 via NASAL
  Filled 2017-09-07: qty 22

## 2017-09-07 NOTE — Care Management Note (Addendum)
Case Management Note  Patient Details  Name: Lance [name] MRN: [id number] Date of Birth: 16-Jan-1941  Subjective/Objective: History ofESRD:T,Th,S missed HD 09/04/17, MS (w/ paraplegia/neurogenic bladder and chronic foley), Type 2 DM,  Multiple pressure wounds. Admitted for Sepsis presumed secondary to catheter associated urinary tract infection.  PCP noted             Action/Plan: Prior to admission patient lived at home with wife and son.  At discharge plans to return to same living situation.    09/08/2017 3:11pm In to speak with patient, discussed referral for Home Health Needs.  Home DME:  [employer] [facility], wheelchair.  Patient denies any additional needs.  NCM provided patient with Assisted Living List as requested. NCM will continue to follow for discharge transition needs.  Expected Discharge Date:    To be determined              Expected Discharge Plan:   To be determined In-House Referral:   Palliative Discharge planning Services   CM consult Status of Service:   In progress, will continue to follow.  Kristen Cardinal, RN 09/07/2017, 1:30 PM

## 2017-09-07 NOTE — Progress Notes (Addendum)
[facility] Progress Note   Subjective: Talking with palliative care.  Objective Vitals:   09/06/17 1540 09/06/17 2034 09/07/17 0623 09/07/17 0806  BP: 127/64 123/64 (!) 144/72 139/67  Pulse: 72 73 92 81  Resp: 18 17 16 18   Temp: 98.1 F (36.7 C) 98.2 F (36.8 C) 98.2 F (36.8 C) 97.9 F (36.6 C)  TempSrc: Oral Oral Oral Oral  SpO2: 93% 98% 97% 97%  Weight:  73 kg (161 lb)    Height:       Physical Exam General: chronically ill appearing male in NAD Heart: S1,S2 RRR Lungs: CTAB Lance/P Abdomen: flat, NT Extremities: No LE edema. Bilateral heel protectors in place.  Dialysis Access: RFA AVF + bruit. Had infiltration at HD unit last week, middle section bruised.   Additional Objective Labs: Basic Metabolic Panel: Recent Labs  Lab 09/04/17 1913 09/05/17 0301 09/06/17 0824  NA 137 134* 131*  K 4.8 4.3 3.9  CL 98* 98* 98*  CO2 27 23 21*  GLUCOSE 132* 96 141*  BUN 52* 57* 73*  CREATININE 4.76* 4.96* 5.75*  CALCIUM 9.7 9.0 8.9  PHOS  --  5.1* 5.3*   Liver Function Tests: Recent Labs  Lab 09/04/17 1913 09/05/17 0301 09/06/17 0824  AST 14*  --   --   ALT 9*  --   --   ALKPHOS 69  --   --   BILITOT 0.8  --   --   PROT 8.5*  --   --   ALBUMIN 3.0* 2.7* 2.5*   No results for input(s): LIPASE, AMYLASE in the last 168 hours. CBC: Recent Labs  Lab 09/04/17 1913 09/05/17 0301 09/06/17 0824  WBC 7.9 8.3 5.5  NEUTROABS 7.3  --   --   HGB 12.4* 10.5* 10.1*  HCT 39.4 33.4*  31.5* 32.0*  MCV 104.0* 103.1* 101.9*  PLT 127* 122* 139*   Blood Culture    Component Value Date/Time   SDES BLOOD LEFT HAND 09/04/2017 2137   SPECREQUEST  09/04/2017 2137    BOTTLES DRAWN AEROBIC AND ANAEROBIC Blood Culture adequate volume   CULT  09/04/2017 2137    NO GROWTH 2 DAYS Performed at [facility] [address], [place], [place] [address]    REPTSTATUS PENDING 09/04/2017 2137    Cardiac Enzymes: No results for input(s): CKTOTAL, CKMB, CKMBINDEX,  TROPONINI in the last 168 hours. CBG: Recent Labs  Lab 09/06/17 0513 09/06/17 1155 09/06/17 1637 09/06/17 2037 09/07/17 0742  GLUCAP 91 88 227* 108* 102*   Iron Studies: No results for input(s): IRON, TIBC, TRANSFERRIN, FERRITIN in the last 72 hours. @lablastinr3 @ Studies/Results: No results found. Medications: . ceFEPime (MAXIPIME) IV     . carvedilol  3.125 mg Oral BID WC  . Chlorhexidine Gluconate Cloth  6 each Topical Q0600  . feeding supplement (PRO-STAT SUGAR FREE 64)  30 mL Oral BID  . insulin aspart  0-9 Units Subcutaneous TID WC  . insulin detemir  9 Units Subcutaneous Q breakfast  . Melatonin  3 mg Oral QHS  . mupirocin ointment  1 application Nasal BID  . simvastatin  20 mg Oral QPM    Dialysis Orders:  TTS at [facility] 3:30hr, 400/800, EDW 74.5kg, 2K/2Ca, AVF, Profile 2, no heparin - Venofer 50mg  IV weekly - No ESA or VDRA recently (^ Ca)  Assessment/Plan: 1.  Sepsis/hematuria/UTI: 1/2 blood cx's. Multiple organisms. H/O resistant Kleb. On IV Cefepime. 2.  ESRD: T,Th,S missed HD 09/04/17 Had HD 09/06/17  off schedule. HD today to get back on schedule.  3.  Hypertension/volume: HD 05/06 Net UF 1.0 liter post wt 73.5 now under OP EDW. BP controlled. Minimal UF today.  4.  Anemia: Hgb 10.1 no ESA yet. Hold weekly Fe D/T sepsis.  5.  Metabolic bone disease: Corr Ca slightly high, Phos ok. No VRDA. Follow.  6.  Nutrition:  Alb low, adding pro-stat. 7.  MS (w/ paraplegia/neurogenic bladder and chronic foley) 8.  Type 2 DM 9.  Multiple pressure wounds: WOC consult.   Rita H. Brown NP-C 09/07/2017, 10:02 AM  [employer] [phone]  I have seen and examined this patient and agree with plan and assessment in the above note with renal recommendations/intervention highlighted.  Feels well without new complaints.  Lance [name] Lance Cherylin Waguespack,MD 09/07/2017 1:07 PM

## 2017-09-07 NOTE — Progress Notes (Signed)
PROGRESS NOTE    Lance [name]  AJO:[id number] [name]: 1941-04-09 DOA: 09/04/2017 PCP: Lavone Orn, MD   Brief Narrative: Lance [name] is a 77 y.o. male with a history of ESRD, MS, neurogenic bladder, hypertension, chronic foley, paraplegia, diabetes mellitus, type 2 on insulin. He presented secondary to gross hematuria and chills. He was admitted for concern for sepsis from urinary source. He is on Cefepime empirically with blood cultures pending (1/4 positive for GPR). Urine culture with multiple species.   Assessment & Plan:   Principal Problem:   Sepsis ([place]) Active Problems:   Multiple sclerosis ([place])   Complicated urinary tract infection   Dyslipidemia associated with type 2 diabetes mellitus (HCC)   Macrocytic anemia   Pressure injury of skin   ESRD on hemodialysis (HCC)   Thrombocytopenia (HCC)   Sepsis Presumed secondary to catheter associated urinary tract infection. Urine culture with multiple species. Blood culture positive (1/4) for gram positive rods. History of resistant Klebsiella. -Continue Cefepime (Pharmacy recommending 5 day course) -Blood cultures  Hematuria Likely secondary to acute infection.  ESRD On HD, T/Th/Saturday -Per nephrology  Pulmonary edema Symptoms improved. Secondary to missed HD  Multiple sclerosis -Continue Baclofen -Palliative care consult for goals of care  Nephrogenic bladder Chronic foley with multiple CAUTIs.  Diabetes mellitus, type 2 Adequate fasting blood sugar -Continue Levemir and SSI  Macrocytic anemia Anemia of chronic disease Vitamin B12 normal. History of chronic disease secondary to kidney disease  Thrombocytopenia In setting of acute infection. Likely reactive. Stable.  Hyperlipidemia -Continue simvastatin  Essential hypertension -Continue Coreg   DVT prophylaxis: SCDs Code Status:   Code Status: DNR Family Communication: None at bedside Disposition Plan: Discharge likely home when  medically ready   Consultants:   Nephrology  Procedures:   Hemodialysis  Antimicrobials:  Cefepime (5/4>>   Subjective: No concerns. In HD.  Objective: Vitals:   09/07/17 1400 09/07/17 1415 09/07/17 1430 09/07/17 1445  BP: (!) 143/67 132/72 130/74 131/71  Pulse: 68 67 68 68  Resp: 16 13 19 19   Temp:      TempSrc:      SpO2: 99% 100% 99% 99%  Weight:      Height:        Intake/Output Summary (Last 24 hours) at 09/07/2017 1502 Last data filed at 09/07/2017 0956 Gross per 24 hour  Intake 540 ml  Output 1100 ml  Net -560 ml   Filed Weights   09/06/17 1045 09/06/17 2034 09/07/17 1315  Weight: 73.8 kg (162 lb 11.2 oz) 73 kg (161 lb) 73.8 kg (162 lb 12.8 oz)    Examination:  General exam: Appears calm and comfortable Respiratory system: Clear to auscultation. Respiratory effort normal. Cardiovascular system: S1 & S2 heard, RRR. No murmurs, rubs, gallops or clicks. Gastrointestinal system: Abdomen is nondistended, soft and nontender. Normal bowel sounds heard. Central nervous system: Alert and oriented. Extremities: No edema. No calf tenderness Skin: No cyanosis. No rashes Psychiatry: Judgement and insight appear normal. Mood & affect appropriate.     Data Reviewed: I have personally reviewed following labs and imaging studies  CBC: Recent Labs  Lab 09/04/17 1913 09/05/17 0301 09/06/17 0824 09/07/17 1334  WBC 7.9 8.3 5.5 5.1  NEUTROABS 7.3  --   --   --   HGB 12.4* 10.5* 10.1* 10.3*  HCT 39.4 33.4*  31.5* 32.0* 32.4*  MCV 104.0* 103.1* 101.9* 101.9*  PLT 127* 122* 139* 947*   Basic Metabolic Panel: Recent Labs  Lab 09/04/17 1913 09/05/17  0301 09/06/17 0824 09/07/17 1334  NA 137 134* 131* 138  K 4.8 4.3 3.9 4.0  CL 98* 98* 98* 101  CO2 27 23 21* 26  GLUCOSE 132* 96 141* 196*  BUN 52* 57* 73* 40*  CREATININE 4.76* 4.96* 5.75* 3.90*  CALCIUM 9.7 9.0 8.9 9.3  PHOS  --  5.1* 5.3* 4.7*   GFR: Estimated Creatinine Clearance: 16.8 mL/min (A) (by  C-G formula based on SCr of 3.9 mg/dL (H)). Liver Function Tests: Recent Labs  Lab 09/04/17 1913 09/05/17 0301 09/06/17 0824 09/07/17 1334  AST 14*  --   --   --   ALT 9*  --   --   --   ALKPHOS 69  --   --   --   BILITOT 0.8  --   --   --   PROT 8.5*  --   --   --   ALBUMIN 3.0* 2.7* 2.5* 2.5*   No results for input(s): LIPASE, AMYLASE in the last 168 hours. No results for input(s): AMMONIA in the last 168 hours. Coagulation Profile: Recent Labs  Lab 09/04/17 1913  INR 1.07   Cardiac Enzymes: No results for input(s): CKTOTAL, CKMB, CKMBINDEX, TROPONINI in the last 168 hours. BNP (last 3 results) No results for input(s): PROBNP in the last 8760 hours. HbA1C: No results for input(s): HGBA1C in the last 72 hours. CBG: Recent Labs  Lab 09/06/17 1155 09/06/17 1637 09/06/17 2037 09/07/17 0742 09/07/17 1154  GLUCAP 88 227* 108* 102* 247*   Lipid Profile: No results for input(s): CHOL, HDL, LDLCALC, TRIG, CHOLHDL, LDLDIRECT in the last 72 hours. Thyroid Function Tests: No results for input(s): TSH, T4TOTAL, FREET4, T3FREE, THYROIDAB in the last 72 hours. Anemia Panel: Recent Labs    09/05/17 0301  VITAMINB12 366   Sepsis Labs: Recent Labs  Lab 09/04/17 1927 09/04/17 2144  LATICACIDVEN 1.83 1.13    Recent Results (from the past 240 hour(s))  Urine culture     Status: Abnormal   Collection Time: 09/04/17  6:59 PM  Result Value Ref Range Status   Specimen Description URINE, CATHETERIZED  Final   Special Requests   Final    NONE Performed at [facility], [place] [address] [place], [place] [address]    Culture MULTIPLE SPECIES PRESENT, SUGGEST RECOLLECTION (A)  Final   Report Status 09/05/2017 FINAL  Final  Culture, blood (Routine x 2)     Status: None (Preliminary result)   Collection Time: 09/04/17  7:13 PM  Result Value Ref Range Status   Specimen Description BLOOD BLOOD LEFT FOREARM  Final   Special Requests   Final    BOTTLES DRAWN AEROBIC AND  ANAEROBIC Blood Culture adequate volume   Culture  Setup Time   Final    GRAM POSITIVE RODS AEROBIC BOTTLE ONLY CRITICAL RESULT CALLED TO, READ BACK BY AND VERIFIED WITH: E SINCLAIR,PHARMD AT 0940 09/06/17 BY L BENFIELD Performed at [facility], [place] [address] [place], [place] [address]    Culture GRAM POSITIVE RODS  Final   Report Status PENDING  Incomplete  Blood Culture ID Panel (Reflexed)     Status: None   Collection Time: 09/04/17  7:13 PM  Result Value Ref Range Status   Enterococcus species NOT DETECTED NOT DETECTED Final   Listeria monocytogenes NOT DETECTED NOT DETECTED Final   Staphylococcus species NOT DETECTED NOT DETECTED Final   Staphylococcus aureus NOT DETECTED NOT DETECTED Final   Streptococcus species NOT DETECTED  NOT DETECTED Final   Streptococcus agalactiae NOT DETECTED NOT DETECTED Final   Streptococcus pneumoniae NOT DETECTED NOT DETECTED Final   Streptococcus pyogenes NOT DETECTED NOT DETECTED Final   Acinetobacter baumannii NOT DETECTED NOT DETECTED Final   Enterobacteriaceae species NOT DETECTED NOT DETECTED Final   Enterobacter cloacae complex NOT DETECTED NOT DETECTED Final   Escherichia coli NOT DETECTED NOT DETECTED Final   Klebsiella oxytoca NOT DETECTED NOT DETECTED Final   Klebsiella pneumoniae NOT DETECTED NOT DETECTED Final   Proteus species NOT DETECTED NOT DETECTED Final   Serratia marcescens NOT DETECTED NOT DETECTED Final   Haemophilus influenzae NOT DETECTED NOT DETECTED Final   Neisseria meningitidis NOT DETECTED NOT DETECTED Final   Pseudomonas aeruginosa NOT DETECTED NOT DETECTED Final   Candida albicans NOT DETECTED NOT DETECTED Final   Candida glabrata NOT DETECTED NOT DETECTED Final   Candida krusei NOT DETECTED NOT DETECTED Final   Candida parapsilosis NOT DETECTED NOT DETECTED Final   Candida tropicalis NOT DETECTED NOT DETECTED Final    Comment: Performed at [facility], [place] [address], [place], [place] [address]    Culture, blood (Routine x 2)     Status: None (Preliminary result)   Collection Time: 09/04/17  9:37 PM  Result Value Ref Range Status   Specimen Description BLOOD LEFT HAND  Final   Special Requests   Final    BOTTLES DRAWN AEROBIC AND ANAEROBIC Blood Culture adequate volume   Culture   Final    NO GROWTH 2 DAYS Performed at [facility], [place] [address] [place], [place] [address]    Report Status PENDING  Incomplete  MRSA PCR Screening     Status: Abnormal   Collection Time: 09/06/17  8:32 PM  Result Value Ref Range Status   MRSA by PCR POSITIVE (A) NEGATIVE Final    Comment:        The GeneXpert MRSA Assay (FDA approved for NASAL specimens only), is one component of a comprehensive MRSA colonization surveillance program. It is not intended to diagnose MRSA infection nor to guide or monitor treatment for MRSA infections. RESULT CALLED TO, READ BACK BY AND VERIFIED WITH: D WOODY RN 09/07/17 0981 JDW Performed at [facility], [place] [address] [place],  [address]          Radiology Studies: No results found.      Scheduled Meds: . carvedilol  3.125 mg Oral BID WC  . Chlorhexidine Gluconate Cloth  6 each Topical Q0600  . feeding supplement (PRO-STAT SUGAR FREE 64)  30 mL Oral BID  . insulin aspart  0-9 Units Subcutaneous TID WC  . insulin detemir  9 Units Subcutaneous Q breakfast  . Melatonin  3 mg Oral QHS  . mupirocin ointment  1 application Nasal BID  . simvastatin  20 mg Oral QPM   Continuous Infusions: . sodium chloride    . sodium chloride    . ceFEPime (MAXIPIME) IV       LOS: 3 days     Cordelia Poche, MD [facility] 09/07/2017, 3:02 PM Pager: [phone]  If 7PM-7AM, please contact night-coverage [email or web address] 09/07/2017, 3:02 PM

## 2017-09-07 NOTE — Consult Note (Signed)
Consultation Note Date: 09/07/2017   Lance [name] Name: Lance [name]  [name]: Nov 17, 1940  MRN: [id number]  Age / Sex: 77 y.o., male  PCP: Lavone Orn, MD Referring Physician: Mariel Aloe, MD  Reason for Consultation: Establishing goals of care  HPI/Lance [name] Profile: 77 y.o. male  with past medical history of on HD TTS (~1.5 yrs), CHF, DM2, MS with chronic spastic paraplegia, neurogenic bladder with chronic indwelling foley, right ishial stage III wound with wound vac admitted on 09/04/2017 with hematuria and fluid overload as he missed dialysis (has never missed dialysis before per notes). Treatment for urosepsis and attending following blood cultures.   Clinical Assessment and Goals of Care: I met today with Lance [name] who is a very impressive gentleman. He is very upfront about his chronic illness of MS (now non-amulatory with paraplegia but has motorized W/C), HD TTS for ~1.5 yrs now, wounds, neurogenic bladder with UTIs. He clearly tells me that he knows these issues will continue to worsen for him. He is very open about his spirituality and practicing Catholic faith and that "I am ready when He's ready for me." Although he follows by saying "but who knows when the time comes I may be asking for two more breathes." He currently says he has no fears or concerns regarding EOL or dying.   His main concern is taking care of his family. He has a wife who he believes has early dementia and is depressed (but resistant to treatment for depression) and a mentally delayed son who they care for at home his whole life. He has advocated for his son to resume program 3 days a week that is essentially an adult day care (his wife was not keen on this idea). He does have 4 more children - 1 in [place], [place] and 3 others in [place]. His main concern is to research and find a Group Home for his son to transition too in the event  that he and his wife are unable to care for him or if they are to pass away. He has completed Living Will and legal documents in preparation for his death to make this as easy and clear for his family as possible. "I do not want anyone fighting after I'm gone." He wants his wife and son taken care of and he has discussed this with his other children who know his wishes and this is his main concern.   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 He speaks fondly of the challenges of sports and university and how this has prepared him for the adversity of life and his current conditions. At this time he continue to hope for more time with his wife and son and to have everything in order to care for them. We did discuss possibility of outpatient palliative care to assist with future discussions and that this may be requested of his PCP,  Dr. Laurann Montana, at any point in time.   Mr. Tiedt thanked me for my visit and shares "sometimes it just helps to air everything out in the open with someone objective." I thank you for this consult and very much enjoyed my conversation with Lance [name].   Primary Decision Maker Lance [name]    SUMMARY OF RECOMMENDATIONS   - Will continue discussing QOL and goals of care - He is very open discussing EOL but plans to try and have more discussions with his wife  Code Status/Advance Care Planning:  DNR - did not discuss today   Symptom Management:   No current symptoms or complaints.   Palliative Prophylaxis:   Bowel Regimen, Delirium Protocol, Frequent Pain Assessment and Palliative Wound Care  Psycho-social/Spiritual:   Desire for further Chaplaincy support: no  Additional Recommendations: Caregiving  Support/Resources and Grief/Bereavement Support  Prognosis:   Unable to determine but prognosis overall poor with co-morbidities.    Discharge Planning: To Be Determined      Primary Diagnoses: Present on Admission: . Sepsis ([place]) . Pressure injury of skin . Multiple sclerosis ([place]) . Thrombocytopenia ([place]) . Dyslipidemia associated with type 2 diabetes mellitus ([name])   I have reviewed the medical record, interviewed the Lance [name] and family, and examined the Lance [name]. The following aspects are pertinent.  Past Medical History:  Diagnosis Date  . Acute CHF (congestive heart failure) ([place]) 02/14/2016  . Acute osteomyelitis, pelvis, right ([place])   . Acute renal failure ([place])   . Anemia in chronic renal disease   . Blood transfusion   . Cardiomyopathy ([place]) 02/17/2016  . Chronic spastic paraplegia    secondary to MS  . Decreased sensation of lower extremity    due to MS  . Decubitus ulcer of ischium   . Decubitus ulcer of trochanter, stage 4 ([place]) 06/14/2015  . ESRD (end stage renal disease) ([place])    [place]  . History of kidney stones    x2  . History of MRSA infection    urine  . History of recurrent UTIs   . Hypertension   . MS (multiple sclerosis) ([place])   . Multiple sclerosis ([place])    stated by client in triage   . Neuralgia neuritis, sciatic nerve    MS for 30 years  . Neurogenic bladder   . PONV (postoperative nausea and vomiting)   . Presence of indwelling urinary catheter   . Self-catheterizes urinary bladder   . Type 2 diabetes mellitus ([place])    Social History   Socioeconomic History  . Marital status: Married    Spouse name: Not on file  . Number of children: Not on file  . Years of education: Not on file  . Highest education level: Not on file  Occupational History  . Not on file  Social Needs  . Financial resource strain: Not on file  . Food insecurity:    Worry: Not on file    Inability: Not on file  . Transportation needs:    Medical: Not on file    Non-medical: Not on file  Tobacco Use  . Smoking status: Former Smoker    Types: Cigars    Last  attempt to quit: 05/06/1983    Years since quitting: 34.3  . Smokeless tobacco: Never Used  Substance and Sexual Activity  . Alcohol use: No    Alcohol/week: 0.6 oz    Types: 1 Glasses of wine per week    Comment: pt has not had any  since Dec 2017  . Drug use: No  . Sexual activity: Not Currently    Birth control/protection: None  Lifestyle  . Physical activity:    Days per week: 0 days    Minutes per session: 0 min  . Stress: Not at all  Relationships  . Social connections:    Talks on phone: Lance [name] refused    Gets together: Lance [name] refused    Attends religious service: Lance [name] refused    Active member of club or organization: Lance [name] refused    Attends meetings of clubs or organizations: Lance [name] refused    Relationship status: Lance [name] refused  Other Topics Concern  . Not on file  Social History Narrative  . Not on file   Family History  Problem Relation Age of Onset  . Stroke Father   . Diabetes Father   . Diabetes Paternal Grandmother   . Stroke Paternal Grandfather   . Heart disease Neg Hx   . Cancer Neg Hx    Scheduled Meds: . carvedilol  3.125 mg Oral BID WC  . Chlorhexidine Gluconate Cloth  6 each Topical Q0600  . feeding supplement (PRO-STAT SUGAR FREE 64)  30 mL Oral BID  . insulin aspart  0-9 Units Subcutaneous TID WC  . insulin detemir  9 Units Subcutaneous Q breakfast  . Melatonin  3 mg Oral QHS  . mupirocin ointment  1 application Nasal BID  . simvastatin  20 mg Oral QPM   Continuous Infusions: . ceFEPime (MAXIPIME) IV     PRN Meds:.acetaminophen **OR** acetaminophen, albuterol, baclofen, hydrocerin, lidocaine-prilocaine, ondansetron **OR** ondansetron (ZOFRAN) IV No Known Allergies Review of Systems  Constitutional: Positive for activity change and appetite change.  Genitourinary: Positive for hematuria.    Physical Exam  Constitutional: He is oriented to person, place, and time. He appears well-developed.  HENT:  Head: Normocephalic and  atraumatic.  Cardiovascular: Normal rate.  Pulmonary/Chest: Effort normal. No accessory muscle usage. No tachypnea. No respiratory distress.  Abdominal: Normal appearance.  Neurological: He is alert and oriented to person, place, and time.  Nursing note and vitals reviewed.   Vital Signs: BP 139/67 (BP Location: Left Arm)   Pulse 81   Temp 97.9 F (36.6 C) (Oral)   Resp 18   Ht 6' 4" (1.93 m)   Wt 73 kg (161 lb)   SpO2 97%   BMI 19.60 kg/m  Pain Scale: 0-10   Pain Score: 0-No pain   SpO2: SpO2: 97 % O2 Device:SpO2: 97 % O2 Flow Rate: .   IO: Intake/output summary:   Intake/Output Summary (Last 24 hours) at 09/07/2017 1317 Last data filed at 09/07/2017 0956 Gross per 24 hour  Intake 780 ml  Output 1100 ml  Net -320 ml    LBM: Last BM Date: 09/05/17 Baseline Weight: Weight: 77.1 kg (170 lb) Most recent weight: Weight: 73 kg (161 lb)     Palliative Assessment/Data:     Time Total: 85 min  Greater than 50%  of this time was spent counseling and coordinating care related to the above assessment and plan.  Signed by: Vinie Sill, NP Palliative Medicine Team Pager # [phone] (M-F 8a-5p) Team Phone # [phone] (Nights/Weekends)

## 2017-09-08 LAB — GLUCOSE, CAPILLARY
Glucose-Capillary: 128 mg/dL — ABNORMAL HIGH (ref 65–99)
Glucose-Capillary: 252 mg/dL — ABNORMAL HIGH (ref 65–99)
Glucose-Capillary: 83 mg/dL (ref 65–99)

## 2017-09-08 LAB — CULTURE, BLOOD (ROUTINE X 2): SPECIAL REQUESTS: ADEQUATE

## 2017-09-08 NOTE — Progress Notes (Addendum)
[facility] Progress Note   Subjective: No new complaints.     Objective Vitals:   09/07/17 1737 09/07/17 2129 09/08/17 0544 09/08/17 0900  BP: (!) 128/59 (!) 143/57 140/80 (!) 145/66  Pulse: 72 87 82 88  Resp: 18 17 18 18   Temp: 98 F (36.7 C) 98 F (36.7 C) 98.2 F (36.8 C) 98 F (36.7 C)  TempSrc: Oral Oral Oral Oral  SpO2: 99% 98% 97% 98%  Weight:  73.8 kg (162 lb 12.6 oz)    Height:       Physical Exam General: chronically ill appearing male in NAD Heart: S1,S2 RRR Lungs: CTAB A/P Abdomen: flat, NT Extremities: No LE edema. Bilateral heel protectors in place.  Dialysis Access: RFA AVF + bruit. Had infiltration at HD unit last week, middle section bruised.   Additional Objective Labs: Basic Metabolic Panel: Recent Labs  Lab 09/05/17 0301 09/06/17 0824 09/07/17 1334  NA 134* 131* 138  K 4.3 3.9 4.0  CL 98* 98* 101  CO2 23 21* 26  GLUCOSE 96 141* 196*  BUN 57* 73* 40*  CREATININE 4.96* 5.75* 3.90*  CALCIUM 9.0 8.9 9.3  PHOS 5.1* 5.3* 4.7*   Liver Function Tests: Recent Labs  Lab 09/04/17 1913 09/05/17 0301 09/06/17 0824 09/07/17 1334  AST 14*  --   --   --   ALT 9*  --   --   --   ALKPHOS 69  --   --   --   BILITOT 0.8  --   --   --   PROT 8.5*  --   --   --   ALBUMIN 3.0* 2.7* 2.5* 2.5*   No results for input(s): LIPASE, AMYLASE in the last 168 hours. CBC: Recent Labs  Lab 09/04/17 1913 09/05/17 0301 09/06/17 0824 09/07/17 1334  WBC 7.9 8.3 5.5 5.1  NEUTROABS 7.3  --   --   --   HGB 12.4* 10.5* 10.1* 10.3*  HCT 39.4 33.4*  31.5* 32.0* 32.4*  MCV 104.0* 103.1* 101.9* 101.9*  PLT 127* 122* 139* 134*   Blood Culture    Component Value Date/Time   SDES BLOOD LEFT HAND 09/04/2017 2137   SPECREQUEST  09/04/2017 2137    BOTTLES DRAWN AEROBIC AND ANAEROBIC Blood Culture adequate volume   CULT  09/04/2017 2137    NO GROWTH 4 DAYS Performed at [facility] [address], [place], [place] [address]    REPTSTATUS  PENDING 09/04/2017 2137    Cardiac Enzymes: No results for input(s): CKTOTAL, CKMB, CKMBINDEX, TROPONINI in the last 168 hours. CBG: Recent Labs  Lab 09/07/17 1154 09/07/17 1738 09/07/17 2129 09/08/17 0741 09/08/17 1141  GLUCAP 247* 117* 241* 128* 252*   Iron Studies: No results for input(s): IRON, TIBC, TRANSFERRIN, FERRITIN in the last 72 hours. @lablastinr3 @ Studies/Results: No results found. Medications: . sodium chloride    . sodium chloride     . carvedilol  3.125 mg Oral BID WC  . Chlorhexidine Gluconate Cloth  6 each Topical Q0600  . feeding supplement (PRO-STAT SUGAR FREE 64)  30 mL Oral BID  . insulin aspart  0-9 Units Subcutaneous TID WC  . insulin detemir  9 Units Subcutaneous Q breakfast  . Melatonin  3 mg Oral QHS  . mupirocin ointment  1 application Nasal BID  . simvastatin  20 mg Oral QPM     Dialysis Orders: TTS at [facility] 3:30hr, 400/800, EDW 74.5kg, 2K/2Ca, AVF, Profile 2, no heparin - Venofer  50mg  IV weekly - No ESA or VDRA recently (^ Ca)  Assessment/Plan: 1. Sepsis/hematuria/UTI: 1/2 blood cx's. Multiple organisms. H/O resistant Kleb. On IV Cefepime. 2. ESRD:T,Th,S Next HD tomorrow.  3. Hypertension/volume:Wt 73.8 now below EDW. BP controlled. UFG 0.5-1 liter tomorrow.  4. Anemia:Hgb 10.3 no ESA yet. Hold weekly Fe D/T sepsis.  5. Metabolic bone disease:Corr Ca slightly high, Phos ok. No VRDA. Follow. 6. Nutrition:Alb 2.5, adding pro-stat. 7. MS (w/ paraplegia/neurogenic bladder and chronic foley) 8. Type 2 DM 9.  Multiple pressure wounds: WOC consult.     Rita H. Brown NP-C 09/08/2017, 3:10 PM  [employer] [phone]  I have seen and examined this patient and agree with plan and assessment in the above note with renal recommendations/intervention highlighted.  Hopeful discharge to home tomorrow after dialysis. Governor Rooks Jory Welke,MD 09/08/2017 3:34 PM

## 2017-09-08 NOTE — Progress Notes (Signed)
PROGRESS NOTE    Lance [name]  TIW:[id number] [name]: 08-Jan-1941 DOA: 09/04/2017 PCP: Lavone Orn, MD   Brief Narrative: Weiland Tomich Sarvis is a 77 y.o. male with a history of ESRD, MS, neurogenic bladder, hypertension, chronic foley, paraplegia, diabetes mellitus, type 2 on insulin. He presented secondary to gross hematuria and chills. He was admitted for concern for sepsis from urinary source.  Treated with cefepime empirically for 5 days ,  blood cultures from 09/04/2017 with 1/4 positive for PROPIONIBACTERIUM ACNES.  Urine culture with multiple species.  Discussed with ID pharmacist, no further antibiotics indicated   Assessment & Plan:   Principal Problem:   Sepsis ([place]) Active Problems:   Multiple sclerosis ([place])   Complicated urinary tract infection   Dyslipidemia associated with type 2 diabetes mellitus (HCC)   Macrocytic anemia   Pressure injury of skin   ESRD on hemodialysis (HCC)   Thrombocytopenia (HCC)   Goals of care, counseling/discussion   Palliative care encounter   Sepsis- Presumed secondary to catheter associated urinary tract infection.Treated with cefepime empirically for 5 days ,  blood cultures from 09/04/2017 with 1/4 positive for PROPIONIBACTERIUM ACNES.  Urine culture with multiple species.  Discussed with ID pharmacist, no further antibiotics indicated  Hematuria-Likely secondary to acute infection, hematuria has resolved,  patient with chronic indwelling Foley catheter which was changed 2 days prior to admission  ESRD- on HD, T/Th/Saturday -Per nephrology  Pulmonary edema Symptoms improved. Secondary to missed HD  Multiple sclerosis -Continue Baclofen -Palliative care consult for goals of care  Nephrogenic bladder- Chronic foley with multiple CAUTIs., patient with chronic indwelling Foley catheter which was changed 2 days prior to admission   Diabetes mellitus, type 2 Adequate fasting blood sugar -Continue Levemir and SSI  Macrocytic  anemia Anemia of chronic disease Vitamin B12 normal. History of chronic disease secondary to kidney disease  Thrombocytopenia In setting of acute infection. Likely reactive. Stable.  Hyperlipidemia -Continue simvastatin  Essential hypertension -Continue Coreg   DVT prophylaxis: SCDs Code Status:   Code Status: DNR Family Communication: None at bedside Disposition Plan: Discharge likely home when medically ready   Consultants:   Nephrology  Procedures:   Hemodialysis  Antimicrobials:  Cefepime (5/4>>   Subjective: No concerns. In HD.  Objective: Vitals:   09/07/17 1737 09/07/17 2129 09/08/17 0544 09/08/17 0900  BP: (!) 128/59 (!) 143/57 140/80 (!) 145/66  Pulse: 72 87 82 88  Resp: 18 17 18 18   Temp: 98 F (36.7 C) 98 F (36.7 C) 98.2 F (36.8 C) 98 F (36.7 C)  TempSrc: Oral Oral Oral Oral  SpO2: 99% 98% 97% 98%  Weight:  73.8 kg (162 lb 12.6 oz)    Height:        Intake/Output Summary (Last 24 hours) at 09/08/2017 1852 Last data filed at 09/08/2017 1300 Gross per 24 hour  Intake 520 ml  Output 1800 ml  Net -1280 ml   Filed Weights   09/06/17 2034 09/07/17 1315 09/07/17 2129  Weight: 73 kg (161 lb) 73.8 kg (162 lb 12.8 oz) 73.8 kg (162 lb 12.6 oz)    Examination:  General exam: Chronically ill-appearing, in no acute distress respiratory system: Clear to auscultation. Respiratory effort normal. Cardiovascular system: S1 & S2 heard, RRR. No murmurs, rubs, gallops or clicks. Gastrointestinal system: Abdomen is nondistended, soft and nontender. Normal bowel sounds heard. Central nervous system: Alert and oriented. Extremities: Heel protectors, right wrist hemodialysis site, with positive thrill and bruit skin: No cyanosis. No rashes Psychiatry:  Judgement and insight appear normal. Mood & affect appropriate.     Data Reviewed: I have personally reviewed following labs and imaging studies  CBC: Recent Labs  Lab 09/04/17 1913 09/05/17 0301  09/06/17 0824 09/07/17 1334  WBC 7.9 8.3 5.5 5.1  NEUTROABS 7.3  --   --   --   HGB 12.4* 10.5* 10.1* 10.3*  HCT 39.4 33.4*  31.5* 32.0* 32.4*  MCV 104.0* 103.1* 101.9* 101.9*  PLT 127* 122* 139* 476*   Basic Metabolic Panel: Recent Labs  Lab 09/04/17 1913 09/05/17 0301 09/06/17 0824 09/07/17 1334  NA 137 134* 131* 138  K 4.8 4.3 3.9 4.0  CL 98* 98* 98* 101  CO2 27 23 21* 26  GLUCOSE 132* 96 141* 196*  BUN 52* 57* 73* 40*  CREATININE 4.76* 4.96* 5.75* 3.90*  CALCIUM 9.7 9.0 8.9 9.3  PHOS  --  5.1* 5.3* 4.7*   GFR: Estimated Creatinine Clearance: 16.8 mL/min (A) (by C-G formula based on SCr of 3.9 mg/dL (H)). Liver Function Tests: Recent Labs  Lab 09/04/17 1913 09/05/17 0301 09/06/17 0824 09/07/17 1334  AST 14*  --   --   --   ALT 9*  --   --   --   ALKPHOS 69  --   --   --   BILITOT 0.8  --   --   --   PROT 8.5*  --   --   --   ALBUMIN 3.0* 2.7* 2.5* 2.5*   No results for input(s): LIPASE, AMYLASE in the last 168 hours. No results for input(s): AMMONIA in the last 168 hours. Coagulation Profile: Recent Labs  Lab 09/04/17 1913  INR 1.07   Cardiac Enzymes: No results for input(s): CKTOTAL, CKMB, CKMBINDEX, TROPONINI in the last 168 hours. BNP (last 3 results) No results for input(s): PROBNP in the last 8760 hours. HbA1C: No results for input(s): HGBA1C in the last 72 hours. CBG: Recent Labs  Lab 09/07/17 1738 09/07/17 2129 09/08/17 0741 09/08/17 1141 09/08/17 1628  GLUCAP 117* 241* 128* 252* 83   Lipid Profile: No results for input(s): CHOL, HDL, LDLCALC, TRIG, CHOLHDL, LDLDIRECT in the last 72 hours. Thyroid Function Tests: No results for input(s): TSH, T4TOTAL, FREET4, T3FREE, THYROIDAB in the last 72 hours. Anemia Panel: No results for input(s): VITAMINB12, FOLATE, FERRITIN, TIBC, IRON, RETICCTPCT in the last 72 hours. Sepsis Labs: Recent Labs  Lab 09/04/17 1927 09/04/17 2144  LATICACIDVEN 1.83 1.13    Recent Results (from the past  240 hour(s))  Urine culture     Status: Abnormal   Collection Time: 09/04/17  6:59 PM  Result Value Ref Range Status   Specimen Description URINE, CATHETERIZED  Final   Special Requests   Final    NONE Performed at [facility], [address], [place], [place] [address]    Culture MULTIPLE SPECIES PRESENT, SUGGEST RECOLLECTION (A)  Final   Report Status 09/05/2017 FINAL  Final  Culture, blood (Routine x 2)     Status: Abnormal   Collection Time: 09/04/17  7:13 PM  Result Value Ref Range Status   Specimen Description BLOOD BLOOD LEFT FOREARM  Final   Special Requests   Final    BOTTLES DRAWN AEROBIC AND ANAEROBIC Blood Culture adequate volume   Culture  Setup Time   Final    GRAM POSITIVE RODS AEROBIC BOTTLE ONLY CRITICAL RESULT CALLED TO, READ BACK BY AND VERIFIED WITH: E SINCLAIR,PHARMD AT 0940 09/06/17 BY L BENFIELD  Culture (A)  Final    PROPIONIBACTERIUM ACNES Standardized susceptibility testing for this organism is not available. Performed at [facility], [place] [address] [place], [place] [address]    Report Status 09/08/2017 FINAL  Final  Blood Culture ID Panel (Reflexed)     Status: None   Collection Time: 09/04/17  7:13 PM  Result Value Ref Range Status   Enterococcus species NOT DETECTED NOT DETECTED Final   Listeria monocytogenes NOT DETECTED NOT DETECTED Final   Staphylococcus species NOT DETECTED NOT DETECTED Final   Staphylococcus aureus NOT DETECTED NOT DETECTED Final   Streptococcus species NOT DETECTED NOT DETECTED Final   Streptococcus agalactiae NOT DETECTED NOT DETECTED Final   Streptococcus pneumoniae NOT DETECTED NOT DETECTED Final   Streptococcus pyogenes NOT DETECTED NOT DETECTED Final   Acinetobacter baumannii NOT DETECTED NOT DETECTED Final   Enterobacteriaceae species NOT DETECTED NOT DETECTED Final   Enterobacter cloacae complex NOT DETECTED NOT DETECTED Final   Escherichia coli NOT DETECTED NOT DETECTED Final   Klebsiella oxytoca NOT  DETECTED NOT DETECTED Final   Klebsiella pneumoniae NOT DETECTED NOT DETECTED Final   Proteus species NOT DETECTED NOT DETECTED Final   Serratia marcescens NOT DETECTED NOT DETECTED Final   Haemophilus influenzae NOT DETECTED NOT DETECTED Final   Neisseria meningitidis NOT DETECTED NOT DETECTED Final   Pseudomonas aeruginosa NOT DETECTED NOT DETECTED Final   Candida albicans NOT DETECTED NOT DETECTED Final   Candida glabrata NOT DETECTED NOT DETECTED Final   Candida krusei NOT DETECTED NOT DETECTED Final   Candida parapsilosis NOT DETECTED NOT DETECTED Final   Candida tropicalis NOT DETECTED NOT DETECTED Final    Comment: Performed at [facility], [place] [address], [place], [place] [address]  Culture, blood (Routine x 2)     Status: None (Preliminary result)   Collection Time: 09/04/17  9:37 PM  Result Value Ref Range Status   Specimen Description BLOOD LEFT HAND  Final   Special Requests   Final    BOTTLES DRAWN AEROBIC AND ANAEROBIC Blood Culture adequate volume   Culture   Final    NO GROWTH 4 DAYS Performed at [facility], [place] [address], [place], [place] [address]    Report Status PENDING  Incomplete  MRSA PCR Screening     Status: Abnormal   Collection Time: 09/06/17  8:32 PM  Result Value Ref Range Status   MRSA by PCR POSITIVE (A) NEGATIVE Final    Comment:        The GeneXpert MRSA Assay (FDA approved for NASAL specimens only), is one component of a comprehensive MRSA colonization surveillance program. It is not intended to diagnose MRSA infection nor to guide or monitor treatment for MRSA infections. RESULT CALLED TO, READ BACK BY AND VERIFIED WITH: D WOODY RN 09/07/17 9675 JDW Performed at [facility], [place] [address], [place], [place] [address]          Radiology Studies: No results found.      Scheduled Meds: . carvedilol  3.125 mg Oral BID WC  . Chlorhexidine Gluconate Cloth  6 each Topical Q0600  . feeding supplement (PRO-STAT  SUGAR FREE 64)  30 mL Oral BID  . insulin aspart  0-9 Units Subcutaneous TID WC  . insulin detemir  9 Units Subcutaneous Q breakfast  . Melatonin  3 mg Oral QHS  . mupirocin ointment  1 application Nasal BID  . simvastatin  20 mg Oral QPM  Continuous Infusions: . sodium chloride    . sodium chloride       LOS: 4 days     Roxan Hockey, MD [facility] 09/08/2017, 6:52 PM Pager: [phone]  If 7PM-7AM, please contact night-coverage [email or web address] 09/08/2017, 6:52 PM

## 2017-09-08 NOTE — Progress Notes (Signed)
Palliative:  Mr. Alfonse Spruce is feeling well today and says he feels he is back to his baseline. Awaiting confirmation of blood cultures currently. He is ready to go home when recommended. Dialysis went well for him yesterday. He says that he feels better about discussing some of his thoughts with his wife. He has no further questions or concerns regarding our palliative conversation yesterday. He inquires to ALF and I explained that this will require research on his part but have [facility] provide him with list of ALF to work from. Emotional support provided.   15 min  Vinie Sill, NP Palliative Medicine Team Pager # [phone] (M-F 8a-5p) Team Phone # [phone] (Nights/Weekends)

## 2017-09-08 NOTE — Progress Notes (Signed)
Inpatient Diabetes Program Recommendations  AACE/ADA: New Consensus Statement on Inpatient Glycemic Control (2015)  Target Ranges:  Prepandial:   less than 140 mg/dL      Peak postprandial:   less than 180 mg/dL (1-2 hours)      Critically ill patients:  140 - 180 mg/dL   Lab Results  Component Value Date   GLUCAP 252 (H) 09/08/2017   HGBA1C 6.1 04/10/2016    Review of Glycemic ControlResults for Lance [name], Lance [name] (MRN [id number]) as of 09/08/2017 13:36  Ref. Range 09/07/2017 11:54 09/07/2017 17:38 09/07/2017 21:29 09/08/2017 07:41 09/08/2017 11:41  Glucose-Capillary Latest Ref Range: 65 - 99 mg/dL 247 (H) 117 (H) 241 (H) 128 (H) 252 (H)   Diabetes history: Type 2 DM Outpatient Diabetes medications:  Novolog 7 units tid with meals, Levemir 9 units with breakfast Current orders for Inpatient glycemic control:  Novolog sensitive tid with meals, Levemir 9 units daily  Inpatient Diabetes Program Recommendations:   Please consider adding Novolog 3 units tid with meals.   Thanks  Adah Perl, RN, BC-ADM Inpatient Diabetes Coordinator Pager [phone] (8a-5p)

## 2017-09-09 LAB — CULTURE, BLOOD (ROUTINE X 2)
Culture: NO GROWTH
SPECIAL REQUESTS: ADEQUATE

## 2017-09-09 LAB — GLUCOSE, CAPILLARY
GLUCOSE-CAPILLARY: 232 mg/dL — AB (ref 65–99)
Glucose-Capillary: 219 mg/dL — ABNORMAL HIGH (ref 65–99)
Glucose-Capillary: 145 mg/dL — ABNORMAL HIGH (ref 65–99)

## 2017-09-09 MED ORDER — ACETAMINOPHEN 325 MG PO TABS: 650.0000 mg | ORAL_TABLET | Freq: Four times a day (QID) | ORAL | 0 refills | Status: AC | PRN

## 2017-09-09 NOTE — Progress Notes (Addendum)
Hemodialysis initiated via R AVF. Some difficulty cannulating venous needle. Clots pulled x2. Removed needles and cannulated higher using 16g. PA aware. Patient very anxious throughout but currently calm once treatment started. Lungs clear. No edema. No current complaints. BFR 350 due to 16g needles. Continue to monitor.

## 2017-09-09 NOTE — Discharge Instructions (Signed)
1) continue outpatient hemodialysis on Tuesdays Thursdays and Saturdays 2) Follow up with a primary care doctor nephrologist as previously advised and scheduled 3) follow-up with your urologist as previously advised and scheduled 4) call if fevers, chills, abdominal pain or other concerns 5)MS (w/ paraplegia/neurogenic bladder and chronic foley)--keep your Foley catheter in and change monthly as previously advised

## 2017-09-09 NOTE — Progress Notes (Signed)
NCM in to speak with patient, verified need of transport via EMS.  NCM verified patient home address.  NCM called [facility] for discharge transport home. Notified Staff RN Wyvonnia Lora of call placed to [place].  Paperwork upfront to be given to [facility] on arrival.  Montel Culver, RN Nurse Case Manager [phone]

## 2017-09-09 NOTE — Progress Notes (Addendum)
[facility] Progress Note   Subjective: Seen prior to HD, having issues with cannulation-pulling clots. Recent fistulagram 09/01/2017. Very anxious-says he needs a valium  Objective Vitals:   09/08/17 0544 09/08/17 0900 09/08/17 2143 09/09/17 0633  BP: 140/80 (!) 145/66 (!) 147/75 (!) 157/78  Pulse: 82 88 75 83  Resp: 18 18 17 18   Temp: 98.2 F (36.8 C) 98 F (36.7 C) 98.2 F (36.8 C) 98.4 F (36.9 C)  TempSrc: Oral Oral Oral   SpO2: 97% 98% 98% 98%  Weight:   73.8 kg (162 lb 12.9 oz)   Height:       Physical Exam General:chronically ill appearing male in NAD Heart:S1,S2 RRR Lungs:CTAB A/P Abdomen:flat, NT Extremities:No LE edema. Bilateral heel protectors in place. Dialysis Access:RFA AVF + bruit. Initial difficulty sticking now cannulated.   Additional Objective Labs: Basic Metabolic Panel: Recent Labs  Lab 09/05/17 0301 09/06/17 0824 09/07/17 1334  NA 134* 131* 138  K 4.3 3.9 4.0  CL 98* 98* 101  CO2 23 21* 26  GLUCOSE 96 141* 196*  BUN 57* 73* 40*  CREATININE 4.96* 5.75* 3.90*  CALCIUM 9.0 8.9 9.3  PHOS 5.1* 5.3* 4.7*   Liver Function Tests: Recent Labs  Lab 09/04/17 1913 09/05/17 0301 09/06/17 0824 09/07/17 1334  AST 14*  --   --   --   ALT 9*  --   --   --   ALKPHOS 69  --   --   --   BILITOT 0.8  --   --   --   PROT 8.5*  --   --   --   ALBUMIN 3.0* 2.7* 2.5* 2.5*   No results for input(s): LIPASE, AMYLASE in the last 168 hours. CBC: Recent Labs  Lab 09/04/17 1913 09/05/17 0301 09/06/17 0824 09/07/17 1334  WBC 7.9 8.3 5.5 5.1  NEUTROABS 7.3  --   --   --   HGB 12.4* 10.5* 10.1* 10.3*  HCT 39.4 33.4*  31.5* 32.0* 32.4*  MCV 104.0* 103.1* 101.9* 101.9*  PLT 127* 122* 139* 134*   Blood Culture    Component Value Date/Time   SDES BLOOD LEFT HAND 09/04/2017 2137   SPECREQUEST  09/04/2017 2137    BOTTLES DRAWN AEROBIC AND ANAEROBIC Blood Culture adequate volume   CULT  09/04/2017 2137    NO GROWTH 4  DAYS Performed at [facility] [address], [place], [place] [address]    REPTSTATUS PENDING 09/04/2017 2137    Cardiac Enzymes: No results for input(s): CKTOTAL, CKMB, CKMBINDEX, TROPONINI in the last 168 hours. CBG: Recent Labs  Lab 09/08/17 0741 09/08/17 1141 09/08/17 1628 09/09/17 0317 09/09/17 0739  GLUCAP 128* 252* 83 219* 145*   Iron Studies: No results for input(s): IRON, TIBC, TRANSFERRIN, FERRITIN in the last 72 hours. @lablastinr3 @ Studies/Results: No results found. Medications: . sodium chloride    . sodium chloride     . carvedilol  3.125 mg Oral BID WC  . Chlorhexidine Gluconate Cloth  6 each Topical Q0600  . feeding supplement (PRO-STAT SUGAR FREE 64)  30 mL Oral BID  . insulin aspart  0-9 Units Subcutaneous TID WC  . insulin detemir  9 Units Subcutaneous Q breakfast  . Melatonin  3 mg Oral QHS  . mupirocin ointment  1 application Nasal BID  . simvastatin  20 mg Oral QPM     Dialysis Orders: TTS at [facility] 3:30hr, 400/800, EDW 74.5kg, 2K/2Ca, AVF, Profile 2, no heparin - Venofer  50mg  IV weekly - No ESA or VDRA recently (^ Ca)  Assessment/Plan: 1. Sepsis/hematuria/UTI:1/2 blood cx's. Multiple organisms. H/O resistant Kleb. ABX stopped.  2. ESRD:T,Th,S HD today .   3. Hypertension/volume:Wt 73.8 now below EDW. BP controlled. UFG 0.5-1 liter today  4. Anemia:Hgb 10.3no ESA yet. Hold weekly Fe D/T sepsis. 5. Metabolic bone disease:Corr Ca slightly high, Phos ok. No VRDA. Follow. 6. Nutrition:Alb 2.5, adding pro-stat. 7. MS (w/ paraplegia/neurogenic bladder and chronic foley) 8. Type 2 DM per primary 9. Multiple pressure wounds: WOC consult.  Disposition: OK from renal stand point to DC home.   Rita H. Brown NP-C 09/09/2017, 9:59 AM  [employer] [phone]  I have seen and examined this patient and agree with plan and assessment in the above note with renal recommendations/intervention highlighted.   Hopeful discharge today after HD.  Will need to f/u with Urology for prophylaxis of UTI's. [place] John A Khaleelah Yowell,MD 09/09/2017 10:32 AM

## 2017-09-09 NOTE — Discharge Summary (Signed)
Lance [name], is a 77 y.o. male  [name] 07/25/1940  MRN [id number].  Admission date:  09/04/2017  Admitting Physician  Norval Morton, MD  Discharge Date:  09/09/2017   Primary MD  Lavone Orn, MD  Recommendations for primary care physician for things to follow:   1) continue outpatient hemodialysis on Tuesdays Thursdays and Saturdays 2) Follow up with a primary care doctor nephrologist as previously advised and scheduled 3) follow-up with your urologist as previously advised and scheduled 4) call if fevers, chills, abdominal pain or other concerns 5)MS (w/ paraplegia/neurogenic bladder and chronic foley)--keep your Foley catheter in and change monthly as previously advised   Admission Diagnosis  Gross hematuria [R31.0] Hematuria [R31.9] Fever in other diseases [R50.81] Urinary tract infection associated with indwelling urethral catheter, initial encounter ([place]) [D78.242P, N39.0]   Discharge Diagnosis  Gross hematuria [R31.0] Hematuria [R31.9] Fever in other diseases [R50.81] Urinary tract infection associated with indwelling urethral catheter, initial encounter ([place]) [N36.144R, N39.0]    Principal Problem:   Sepsis ([place]) Active Problems:   Multiple sclerosis ([place])   Complicated urinary tract infection   Dyslipidemia associated with type 2 diabetes mellitus ([place])   Macrocytic anemia   Pressure injury of skin   ESRD on hemodialysis ([place])   Thrombocytopenia ([place])   Goals of care, counseling/discussion   Palliative care encounter      Past Medical History:  Diagnosis Date  . Acute CHF (congestive heart failure) ([place]) 02/14/2016  . Acute osteomyelitis, pelvis, right ([place])   . Acute renal failure ([place])   . Anemia in chronic renal disease   . Blood transfusion   . Cardiomyopathy ([place]) 02/17/2016  . Chronic spastic paraplegia    secondary to MS  . Decreased sensation of lower  extremity    due to MS  . Decubitus ulcer of ischium   . Decubitus ulcer of trochanter, stage 4 ([place]) 06/14/2015  . ESRD (end stage renal disease) ([place])    [place]  . History of kidney stones    x2  . History of MRSA infection    urine  . History of recurrent UTIs   . Hypertension   . MS (multiple sclerosis) ([place])   . Multiple sclerosis ([place])    stated by client in triage   . Neuralgia neuritis, sciatic nerve    MS for 30 years  . Neurogenic bladder   . PONV (postoperative nausea and vomiting)   . Presence of indwelling urinary catheter   . Self-catheterizes urinary bladder   . Type 2 diabetes mellitus ([place])     Past Surgical History:  Procedure Laterality Date  . AMPUTATION Left 01/22/2017   Procedure: Left 5th Ray Amputation;  Surgeon: Newt Minion, MD;  Location: [facility];  Service: Orthopedics;  Laterality: Left;  . APPLICATION OF A-CELL OF EXTREMITY Left 09/05/2014   Procedure: PLACEMENT OF ACELL AND VAC;  Surgeon: Theodoro Kos, DO;  Location: [facility];  Service: Plastics;  Laterality: Left;  . BASCILIC VEIN TRANSPOSITION Right 03/24/2016  Procedure: RADIAL- CEPHALIC FISTULA CREATION RIGHT ARM;  Surgeon: Conrad Brownsville, MD;  Location: [facility];  Service: Vascular;  Laterality: Right;  . Buttocks flap Left   . COLONOSCOPY    . EXTRACORPOREAL SHOCK WAVE LITHOTRIPSY  03/23/2011   x2  . FINGER SURGERY  2004  . hip flap Right   . INCISION AND DRAINAGE OF WOUND Left 09/05/2014   Procedure: IRRIGATION AND DEBRIDEMENT OF LEFT ISCHIUM WOUND WITH ;  Surgeon: Theodoro Kos, DO;  Location: [facility];  Service: Plastics;  Laterality: Left;  . INSERTION OF DIALYSIS CATHETER N/A 02/27/2016   Procedure: INSERTION OF DIALYSIS CATHETER-RIGHT INTERNAL JUGULA PLACEMENT;  Surgeon: Conrad Havana, MD;  Location: [facility];  Service: Vascular;  Laterality: N/A;  . TONSILLECTOMY    . WOUND DEBRIDEMENT     [facility]/2/17[facility]        HPI  from the history and physical done on the day of admission:      Chief Complaint: Blood in urine I have personally briefly reviewed patient's old medical records in [place]   HPI: Lance [name] is a 77 y.o. male with medical history significant of ESRD on HD, CHF, DM, MS, neurogenic bladder with chronic indwelling Foley, and  anemia; who presents with complaints of blood in urine.  Her baseline patient is wheelchair-bound his Foley catheter was changed yesterday as it normally is done once a month by [facility].  He normally notes a little blood in his urine following the exchange, but normally does not have gross blood like he saw today.  He complains of associated symptoms of chills, general malaise, and a mild cough Due to his symptoms he reports seeing his hemodialysis session today.  Denies any complaints of shortness of breath, nausea, vomiting, diarrhea, or abdominal discomfort/fullness.   ED Course: Upon admission into the emergency department patient was noted to be febrile up to 103.1 F, heart rates 94-1 06, respirations 11-20, and all other vital signs maintained.  Was revealed WBC 7.9, hemoglobin 12.4, platelets 127, potassium 4.8, BUN 52, creatinine 4.76, and lactic acid 1.83.  Urinalysis revealed gross blood.  Given patient's recent history of Klebsiella urinary tract infection he was started on cefepime.  Patient was not given full fluid bolus due to end-stage renal disease history.       Hospital Course:   Assessment & Plan:   Principal Problem:   Sepsis ([place]) Active Problems:   Multiple sclerosis ([place])   Complicated urinary tract infection   Dyslipidemia associated with type 2 diabetes mellitus (HCC)   Macrocytic anemia   Pressure injury of skin   ESRD on hemodialysis (H[facility])   Thrombocytopenia (H[facility])   Goals of care, counseling/discussion   Palliative care encounter    Brief Narrative: Lance [name] is a 77 y.o. male with a history of ESRD, MS, neurogenic bladder, hypertension,  chronic foley, paraplegia, diabetes mellitus, type 2 on insulin. He presented secondary to gross hematuria and chills. He was admitted for concern for sepsis from urinary source.  Treated with cefepime empirically for 5 days ,  blood cultures from 09/04/2017 with 1/4 positive for PROPIONIBACTERIUM ACNES.  Urine culture with multiple species.  Discussed with ID pharmacist, no further antibiotics indicated    Plan:- 1)Sepsis- Presumed secondary to catheter associated urinary tract infection.Treated with cefepime empirically for 5 days ,  blood cultures from 09/04/2017 with 1/4 positive for PROPIONIBACTERIUM ACNES.  Urine culture with multiple species.  Discussed with ID pharmacist, no further  antibiotics indicated  2)Hematuria-Likely secondary to acute infection, hematuria has resolved,  patient with chronic indwelling Foley catheter which was changed 2 days prior to admission, follow-up with urologist if hematuria recurs or persist  3)ESRD- on HD, T/Th/Saturday-last HD 09/09/2017,   4)Pulmonary edema-volume overload, Symptoms resolved, secondary to missed HD  5)Multiple sclerosis - --Continue Baclofen  6)MS (w/ paraplegia/neurogenic bladder and chronic foley)--keep your Foley catheter in and change monthly as previously advised  7)Neurogenic  bladder- Chronic foley with multiple CAUTIs., patient with chronic indwelling Foley catheter due to multiple sclerosis which was changed 2 days prior to admission  8)Diabetes mellitus, type 2-   stable, , resume home insulin regimen  9)Anemia of chronic disease- Vitamin B12 normal. History of chronic disease secondary to CKD- EPO agent as per nephrologist  10)Hyperlipidemia- -Continue simvastatin  11)Essential hypertension- -Continue Coreg   DVT prophylaxis: SCDs Code Status:   Code Status: DNR Family Communication: None at bedside Disposition Plan: Discharge  home   Consultants:   Nephrology  Procedures:    Hemodialysis  Antimicrobials:  Cefepime (5/4>> for 5 days   Discharge Condition: stable, Afebrile without hematuria  Follow UP  Follow-up Information    Lavone Orn, MD Follow up.   Specialty:  Internal Medicine Contact information: [address] [place] [address] [phone]           Diet and Activity recommendation:  As advised  Discharge Instructions    Discharge Instructions    Call MD for:  difficulty breathing, headache or visual disturbances   Complete by:  As directed    Call MD for:  persistant dizziness or light-headedness   Complete by:  As directed    Call MD for:  persistant nausea and vomiting   Complete by:  As directed    Call MD for:  severe uncontrolled pain   Complete by:  As directed    Call MD for:  temperature >100.4   Complete by:  As directed    Diet - low sodium heart healthy   Complete by:  As directed    Discharge instructions   Complete by:  As directed    1) continue outpatient hemodialysis on Tuesdays Thursdays and Saturdays 2) Follow up with a primary care doctor nephrologist as previously advised and scheduled 3) follow-up with your urologist as previously advised and scheduled 4) call if fevers, chills, abdominal pain or other concerns 5)MS (w/ paraplegia/neurogenic bladder and chronic foley)--keep your Foley catheter in and change monthly as previously advised   Increase activity slowly   Complete by:  As directed         Discharge Medications     Allergies as of 09/09/2017   No Known Allergies     Medication List    TAKE these medications   acetaminophen 325 MG tablet Commonly known as:  TYLENOL Take 2 tablets (650 mg total) by mouth every 6 (six) hours as needed for mild pain (or Fever >/= 101).   baclofen 10 MG tablet Commonly known as:  LIORESAL Take 1 tablet (10 mg total) by mouth at bedtime as needed for muscle spasms.   carvedilol 3.125 MG tablet Commonly known as:  COREG Take  1 tablet (3.125 mg total) by mouth 2 (two) times daily with a meal.   eucerin cream Apply 1 application daily as needed topically for dry skin. For feet   insulin aspart 100 UNIT/ML FlexPen Commonly known as:  NOVOLOG FLEXPEN Inject 0-9 Units into the skin 3 (  three) times daily with meals. Per sliding scale What changed:    how much to take  additional instructions   insulin detemir 100 UNIT/ML injection Commonly known as:  LEVEMIR Inject 9 Units into the skin daily with breakfast.   lidocaine-prilocaine cream Commonly known as:  EMLA Apply 1 application topically as needed. What changed:  reasons to take this   Melatonin 3 MG Tabs Take 3 mg by mouth at bedtime.   Pen Needles 30G X 8 MM Misc Inject 1 each into the skin 4 (four) times daily. .   simvastatin 20 MG tablet Commonly known as:  ZOCOR Take 20 mg by mouth every evening.       Major procedures and Radiology Reports - PLEASE review detailed and final reports for all details, in brief -   Dg Chest 2 View  Result Date: 09/04/2017 CLINICAL DATA:  Sepsis. EXAM: CHEST - 2 VIEW COMPARISON:  06/09/2017 FINDINGS: Mildly enlarged cardiac silhouette. Calcific atherosclerotic disease of the aorta. Mediastinal contours appear intact. There is no evidence of lobar airspace consolidation, pleural effusion or pneumothorax. Diffusely increased interstitial markings and septal thickening. Osseous structures are without acute abnormality. Soft tissues are grossly normal. IMPRESSION: Probable interstitial pulmonary edema. No lobar consolidations seen. Electronically Signed   By: Fidela Salisbury M.D.   On: 09/04/2017 19:32   US Renal  Result Date: 09/05/2017 CLINICAL DATA:  77 year old male with hematuria. End-stage renal disease. EXAM: RENAL / URINARY TRACT ULTRASOUND COMPLETE COMPARISON:  Renal ultrasound dated 05/09/2016 FINDINGS: Right Kidney: Length: 10.9 cm. There is diffuse increased renal echogenicity. There is a 1.9 x 1.2 x  1.5 cm superior pole cyst. No hydronephrosis or shadowing stone. Left Kidney: Length: 10.5 cm. There is diffuse increased echogenicity. No hydronephrosis or shadowing stone. Bladder: A Foley catheter is noted in the partially distended urinary bladder. There is trabeculation of the bladder wall indicative of chronic bladder outlet obstruction. IMPRESSION: 1. Echogenic kidneys consistent with chronic kidney disease. Clinical correlation is recommended. 2. No hydronephrosis or shadowing stone. 3. Trabeculated urinary bladder suggestive of chronic bladder outlet obstruction. The bladder is partially distended around a Foley catheter. Electronically Signed   By: Anner Crete M.D.   On: 09/05/2017 01:15    Micro Results   Recent Results (from the past 240 hour(s))  Urine culture     Status: Abnormal   Collection Time: 09/04/17  6:59 PM  Result Value Ref Range Status   Specimen Description URINE, CATHETERIZED  Final   Special Requests   Final    NONE Performed at [facility], [address] [place], [place] [address]    Culture MULTIPLE SPECIES PRESENT, SUGGEST RECOLLECTION (A)  Final   Report Status 09/05/2017 FINAL  Final  Culture, blood (Routine x 2)     Status: Abnormal   Collection Time: 09/04/17  7:13 PM  Result Value Ref Range Status   Specimen Description BLOOD BLOOD LEFT FOREARM  Final   Special Requests   Final    BOTTLES DRAWN AEROBIC AND ANAEROBIC Blood Culture adequate volume   Culture  Setup Time   Final    GRAM POSITIVE RODS AEROBIC BOTTLE ONLY CRITICAL RESULT CALLED TO, READ BACK BY AND VERIFIED WITH: E SINCLAIR,PHARMD AT 0940 09/06/17 BY L BENFIELD    Culture (A)  Final    PROPIONIBACTERIUM ACNES Standardized susceptibility testing for this organism is not available. Performed at [facility], [place] [address] [place], [place] [address]    Report Status  09/08/2017 FINAL  Final  Blood Culture ID Panel (Reflexed)     Status: None   Collection Time: 09/04/17   7:13 PM  Result Value Ref Range Status   Enterococcus species NOT DETECTED NOT DETECTED Final   Listeria monocytogenes NOT DETECTED NOT DETECTED Final   Staphylococcus species NOT DETECTED NOT DETECTED Final   Staphylococcus aureus NOT DETECTED NOT DETECTED Final   Streptococcus species NOT DETECTED NOT DETECTED Final   Streptococcus agalactiae NOT DETECTED NOT DETECTED Final   Streptococcus pneumoniae NOT DETECTED NOT DETECTED Final   Streptococcus pyogenes NOT DETECTED NOT DETECTED Final   Acinetobacter baumannii NOT DETECTED NOT DETECTED Final   Enterobacteriaceae species NOT DETECTED NOT DETECTED Final   Enterobacter cloacae complex NOT DETECTED NOT DETECTED Final   Escherichia coli NOT DETECTED NOT DETECTED Final   Klebsiella oxytoca NOT DETECTED NOT DETECTED Final   Klebsiella pneumoniae NOT DETECTED NOT DETECTED Final   Proteus species NOT DETECTED NOT DETECTED Final   Serratia marcescens NOT DETECTED NOT DETECTED Final   Haemophilus influenzae NOT DETECTED NOT DETECTED Final   Neisseria meningitidis NOT DETECTED NOT DETECTED Final   Pseudomonas aeruginosa NOT DETECTED NOT DETECTED Final   Candida albicans NOT DETECTED NOT DETECTED Final   Candida glabrata NOT DETECTED NOT DETECTED Final   Candida krusei NOT DETECTED NOT DETECTED Final   Candida parapsilosis NOT DETECTED NOT DETECTED Final   Candida tropicalis NOT DETECTED NOT DETECTED Final    Comment: Performed at [facility], [address], [place], [place] [address]  Culture, blood (Routine x 2)     Status: None   Collection Time: 09/04/17  9:37 PM  Result Value Ref Range Status   Specimen Description BLOOD LEFT HAND  Final   Special Requests   Final    BOTTLES DRAWN AEROBIC AND ANAEROBIC Blood Culture adequate volume   Culture   Final    NO GROWTH 5 DAYS Performed at [facility], [place] [address] [place], [place] [address]    Report Status 09/09/2017 FINAL  Final  MRSA PCR Screening     Status: Abnormal    Collection Time: 09/06/17  8:32 PM  Result Value Ref Range Status   MRSA by PCR POSITIVE (A) NEGATIVE Final    Comment:        The GeneXpert MRSA Assay (FDA approved for NASAL specimens only), is one component of a comprehensive MRSA colonization surveillance program. It is not intended to diagnose MRSA infection nor to guide or monitor treatment for MRSA infections. RESULT CALLED TO, READ BACK BY AND VERIFIED WITH: D WOODY RN 09/07/17 0350 JDW Performed at [facility], [place] [address] [place], [place] [address]        Today   Subjective    Lance [name] today has no new concerns , no fevers, no vomiting, no hematuria, eating and drinking well, tolerated hemodialysis well on 09/09/2017          Patient has been seen and examined prior to discharge   Objective   Blood pressure 118/60, pulse 83, temperature 97.6 F (36.4 C), temperature source Oral, resp. rate 16, height 6\' 4"  (1.93 m), weight 73.9 kg (162 lb 14.7 oz), SpO2 96 %.   Intake/Output Summary (Last 24 hours) at 09/09/2017 1352 Last data filed at 09/09/2017 1343 Gross per 24 hour  Intake 360 ml  Output 1350 ml  Net -990 ml    Exam General exam: Chronically ill-appearing, in no acute distress  respiratory system: Clear to auscultation. Respiratory effort normal. Cardiovascular system: S1 & S2 heard, RRR. No murmurs, rubs, gallops or clicks. Gastrointestinal system: Abdomen is nondistended, soft and nontender. Normal bowel sounds heard. Central nervous system: Alert and oriented. Extremities: Heel protectors, right wrist hemodialysis site, with positive thrill and bruit skin: No cyanosis. No rashes Psychiatry: Judgement and insight appear normal. Mood & affect appropriate.      Data Review   CBC w Diff:  Lab Results  Component Value Date   WBC 5.1 09/07/2017   HGB 10.3 (L) 09/07/2017   HCT 32.4 (L) 09/07/2017   HCT 31.5 (L) 09/05/2017   PLT 134 (L) 09/07/2017   LYMPHOPCT 4 09/04/2017    MONOPCT 3 09/04/2017   EOSPCT 1 09/04/2017   BASOPCT 0 09/04/2017    CMP:  Lab Results  Component Value Date   NA 138 09/07/2017   NA 143 04/20/2016   K 4.0 09/07/2017   CL 101 09/07/2017   CO2 26 09/07/2017   BUN 40 (H) 09/07/2017   BUN 62 (A) 04/20/2016   CREATININE 3.90 (H) 09/07/2017   GLU 125 04/20/2016   PROT 8.5 (H) 09/04/2017   ALBUMIN 2.5 (L) 09/07/2017   BILITOT 0.8 09/04/2017   ALKPHOS 69 09/04/2017   AST 14 (L) 09/04/2017   ALT 9 (L) 09/04/2017  .   Total Discharge time is about 33 minutes  Roxan Hockey M.D on 09/09/2017 at [place] PM  [facility]   Office  [phone]  Voice Recognition Viviann Spare dictation system was used to create this note, attempts have been made to correct errors. Please contact the author with questions and/or clarifications.

## 2017-09-09 NOTE — Progress Notes (Signed)
Patient discharged to Home via [facility]. After visit Summary reviewed. Patient capable of reverbalizing medications and follow up visits. No signs and symptoms of distress noted. Patient educated to return to the ED in the case of an emergency. Dillon Bjork RN

## 2017-09-11 DIAGNOSIS — Z992 Dependence on renal dialysis: Secondary | ICD-10-CM | POA: Diagnosis not present

## 2017-09-11 DIAGNOSIS — E1122 Type 2 diabetes mellitus with diabetic chronic kidney disease: Secondary | ICD-10-CM | POA: Diagnosis not present

## 2017-09-11 DIAGNOSIS — N2581 Secondary hyperparathyroidism of renal origin: Secondary | ICD-10-CM | POA: Diagnosis not present

## 2017-09-11 DIAGNOSIS — N186 End stage renal disease: Secondary | ICD-10-CM | POA: Diagnosis not present

## 2017-09-11 DIAGNOSIS — D631 Anemia in chronic kidney disease: Secondary | ICD-10-CM | POA: Diagnosis not present

## 2017-09-14 DIAGNOSIS — D631 Anemia in chronic kidney disease: Secondary | ICD-10-CM | POA: Diagnosis not present

## 2017-09-14 DIAGNOSIS — E1122 Type 2 diabetes mellitus with diabetic chronic kidney disease: Secondary | ICD-10-CM | POA: Diagnosis not present

## 2017-09-14 DIAGNOSIS — N186 End stage renal disease: Secondary | ICD-10-CM | POA: Diagnosis not present

## 2017-09-14 DIAGNOSIS — N2581 Secondary hyperparathyroidism of renal origin: Secondary | ICD-10-CM | POA: Diagnosis not present

## 2017-09-14 DIAGNOSIS — Z992 Dependence on renal dialysis: Secondary | ICD-10-CM | POA: Diagnosis not present

## 2017-09-16 DIAGNOSIS — N2581 Secondary hyperparathyroidism of renal origin: Secondary | ICD-10-CM | POA: Diagnosis not present

## 2017-09-16 DIAGNOSIS — N186 End stage renal disease: Secondary | ICD-10-CM | POA: Diagnosis not present

## 2017-09-16 DIAGNOSIS — D631 Anemia in chronic kidney disease: Secondary | ICD-10-CM | POA: Diagnosis not present

## 2017-09-16 DIAGNOSIS — Z992 Dependence on renal dialysis: Secondary | ICD-10-CM | POA: Diagnosis not present

## 2017-09-16 DIAGNOSIS — E1122 Type 2 diabetes mellitus with diabetic chronic kidney disease: Secondary | ICD-10-CM | POA: Diagnosis not present

## 2017-09-17 DIAGNOSIS — N390 Urinary tract infection, site not specified: Secondary | ICD-10-CM | POA: Diagnosis not present

## 2017-09-18 DIAGNOSIS — N2581 Secondary hyperparathyroidism of renal origin: Secondary | ICD-10-CM | POA: Diagnosis not present

## 2017-09-18 DIAGNOSIS — D631 Anemia in chronic kidney disease: Secondary | ICD-10-CM | POA: Diagnosis not present

## 2017-09-18 DIAGNOSIS — E1122 Type 2 diabetes mellitus with diabetic chronic kidney disease: Secondary | ICD-10-CM | POA: Diagnosis not present

## 2017-09-18 DIAGNOSIS — Z992 Dependence on renal dialysis: Secondary | ICD-10-CM | POA: Diagnosis not present

## 2017-09-18 DIAGNOSIS — N186 End stage renal disease: Secondary | ICD-10-CM | POA: Diagnosis not present

## 2017-09-20 DIAGNOSIS — L89893 Pressure ulcer of other site, stage 3: Secondary | ICD-10-CM | POA: Diagnosis not present

## 2017-09-20 DIAGNOSIS — L89623 Pressure ulcer of left heel, stage 3: Secondary | ICD-10-CM | POA: Diagnosis not present

## 2017-09-20 DIAGNOSIS — N186 End stage renal disease: Secondary | ICD-10-CM | POA: Diagnosis not present

## 2017-09-20 DIAGNOSIS — S81802A Unspecified open wound, left lower leg, initial encounter: Secondary | ICD-10-CM | POA: Diagnosis not present

## 2017-09-20 DIAGNOSIS — G35 Multiple sclerosis: Secondary | ICD-10-CM | POA: Diagnosis not present

## 2017-09-20 DIAGNOSIS — I132 Hypertensive heart and chronic kidney disease with heart failure and with stage 5 chronic kidney disease, or end stage renal disease: Secondary | ICD-10-CM | POA: Diagnosis not present

## 2017-09-20 DIAGNOSIS — G8220 Paraplegia, unspecified: Secondary | ICD-10-CM | POA: Diagnosis not present

## 2017-09-20 DIAGNOSIS — L89314 Pressure ulcer of right buttock, stage 4: Secondary | ICD-10-CM | POA: Diagnosis not present

## 2017-09-20 DIAGNOSIS — E1122 Type 2 diabetes mellitus with diabetic chronic kidney disease: Secondary | ICD-10-CM | POA: Diagnosis not present

## 2017-09-20 DIAGNOSIS — I509 Heart failure, unspecified: Secondary | ICD-10-CM | POA: Diagnosis not present

## 2017-09-20 DIAGNOSIS — M86651 Other chronic osteomyelitis, right thigh: Secondary | ICD-10-CM | POA: Diagnosis not present

## 2017-09-20 LAB — GLUCOSE, CAPILLARY: Glucose-Capillary: 55 mg/dL — ABNORMAL LOW (ref 65–99)

## 2017-09-21 DIAGNOSIS — D631 Anemia in chronic kidney disease: Secondary | ICD-10-CM | POA: Diagnosis not present

## 2017-09-21 DIAGNOSIS — N2581 Secondary hyperparathyroidism of renal origin: Secondary | ICD-10-CM | POA: Diagnosis not present

## 2017-09-21 DIAGNOSIS — N186 End stage renal disease: Secondary | ICD-10-CM | POA: Diagnosis not present

## 2017-09-21 DIAGNOSIS — Z992 Dependence on renal dialysis: Secondary | ICD-10-CM | POA: Diagnosis not present

## 2017-09-21 DIAGNOSIS — E1122 Type 2 diabetes mellitus with diabetic chronic kidney disease: Secondary | ICD-10-CM | POA: Diagnosis not present

## 2017-09-23 DIAGNOSIS — N2581 Secondary hyperparathyroidism of renal origin: Secondary | ICD-10-CM | POA: Diagnosis not present

## 2017-09-23 DIAGNOSIS — N186 End stage renal disease: Secondary | ICD-10-CM | POA: Diagnosis not present

## 2017-09-23 DIAGNOSIS — E1122 Type 2 diabetes mellitus with diabetic chronic kidney disease: Secondary | ICD-10-CM | POA: Diagnosis not present

## 2017-09-23 DIAGNOSIS — D631 Anemia in chronic kidney disease: Secondary | ICD-10-CM | POA: Diagnosis not present

## 2017-09-23 DIAGNOSIS — Z992 Dependence on renal dialysis: Secondary | ICD-10-CM | POA: Diagnosis not present

## 2017-09-25 DIAGNOSIS — D631 Anemia in chronic kidney disease: Secondary | ICD-10-CM | POA: Diagnosis not present

## 2017-09-25 DIAGNOSIS — N2581 Secondary hyperparathyroidism of renal origin: Secondary | ICD-10-CM | POA: Diagnosis not present

## 2017-09-25 DIAGNOSIS — E1122 Type 2 diabetes mellitus with diabetic chronic kidney disease: Secondary | ICD-10-CM | POA: Diagnosis not present

## 2017-09-25 DIAGNOSIS — Z992 Dependence on renal dialysis: Secondary | ICD-10-CM | POA: Diagnosis not present

## 2017-09-25 DIAGNOSIS — N186 End stage renal disease: Secondary | ICD-10-CM | POA: Diagnosis not present

## 2017-09-28 DIAGNOSIS — N2581 Secondary hyperparathyroidism of renal origin: Secondary | ICD-10-CM | POA: Diagnosis not present

## 2017-09-28 DIAGNOSIS — Z992 Dependence on renal dialysis: Secondary | ICD-10-CM | POA: Diagnosis not present

## 2017-09-28 DIAGNOSIS — D631 Anemia in chronic kidney disease: Secondary | ICD-10-CM | POA: Diagnosis not present

## 2017-09-28 DIAGNOSIS — E1122 Type 2 diabetes mellitus with diabetic chronic kidney disease: Secondary | ICD-10-CM | POA: Diagnosis not present

## 2017-09-28 DIAGNOSIS — N186 End stage renal disease: Secondary | ICD-10-CM | POA: Diagnosis not present

## 2017-09-30 DIAGNOSIS — D631 Anemia in chronic kidney disease: Secondary | ICD-10-CM | POA: Diagnosis not present

## 2017-09-30 DIAGNOSIS — E1122 Type 2 diabetes mellitus with diabetic chronic kidney disease: Secondary | ICD-10-CM | POA: Diagnosis not present

## 2017-09-30 DIAGNOSIS — N186 End stage renal disease: Secondary | ICD-10-CM | POA: Diagnosis not present

## 2017-09-30 DIAGNOSIS — N2581 Secondary hyperparathyroidism of renal origin: Secondary | ICD-10-CM | POA: Diagnosis not present

## 2017-09-30 DIAGNOSIS — Z992 Dependence on renal dialysis: Secondary | ICD-10-CM | POA: Diagnosis not present

## 2017-10-02 DIAGNOSIS — N2581 Secondary hyperparathyroidism of renal origin: Secondary | ICD-10-CM | POA: Diagnosis not present

## 2017-10-02 DIAGNOSIS — N186 End stage renal disease: Secondary | ICD-10-CM | POA: Diagnosis not present

## 2017-10-02 DIAGNOSIS — D631 Anemia in chronic kidney disease: Secondary | ICD-10-CM | POA: Diagnosis not present

## 2017-10-02 DIAGNOSIS — Z992 Dependence on renal dialysis: Secondary | ICD-10-CM | POA: Diagnosis not present

## 2017-10-02 DIAGNOSIS — E1122 Type 2 diabetes mellitus with diabetic chronic kidney disease: Secondary | ICD-10-CM | POA: Diagnosis not present

## 2017-10-04 DIAGNOSIS — N319 Neuromuscular dysfunction of bladder, unspecified: Secondary | ICD-10-CM | POA: Diagnosis not present

## 2017-10-05 DIAGNOSIS — D631 Anemia in chronic kidney disease: Secondary | ICD-10-CM | POA: Diagnosis not present

## 2017-10-05 DIAGNOSIS — N186 End stage renal disease: Secondary | ICD-10-CM | POA: Diagnosis not present

## 2017-10-05 DIAGNOSIS — Z992 Dependence on renal dialysis: Secondary | ICD-10-CM | POA: Diagnosis not present

## 2017-10-05 DIAGNOSIS — N2581 Secondary hyperparathyroidism of renal origin: Secondary | ICD-10-CM | POA: Diagnosis not present

## 2017-10-07 DIAGNOSIS — N2581 Secondary hyperparathyroidism of renal origin: Secondary | ICD-10-CM | POA: Diagnosis not present

## 2017-10-07 DIAGNOSIS — D631 Anemia in chronic kidney disease: Secondary | ICD-10-CM | POA: Diagnosis not present

## 2017-10-07 DIAGNOSIS — Z992 Dependence on renal dialysis: Secondary | ICD-10-CM | POA: Diagnosis not present

## 2017-10-07 DIAGNOSIS — N186 End stage renal disease: Secondary | ICD-10-CM | POA: Diagnosis not present

## 2017-10-09 DIAGNOSIS — Z992 Dependence on renal dialysis: Secondary | ICD-10-CM | POA: Diagnosis not present

## 2017-10-09 DIAGNOSIS — N186 End stage renal disease: Secondary | ICD-10-CM | POA: Diagnosis not present

## 2017-10-09 DIAGNOSIS — D631 Anemia in chronic kidney disease: Secondary | ICD-10-CM | POA: Diagnosis not present

## 2017-10-09 DIAGNOSIS — N2581 Secondary hyperparathyroidism of renal origin: Secondary | ICD-10-CM | POA: Diagnosis not present

## 2017-10-11 ENCOUNTER — Encounter (HOSPITAL_BASED_OUTPATIENT_CLINIC_OR_DEPARTMENT_OTHER): Payer: Medicare Other | Attending: Internal Medicine

## 2017-10-11 ENCOUNTER — Other Ambulatory Visit (HOSPITAL_COMMUNITY)
Admission: RE | Admit: 2017-10-11 | Discharge: 2017-10-11 | Disposition: A | Discharge status: Home / Self Care | Payer: Medicare Other | Source: Other Acute Inpatient Hospital | Attending: Internal Medicine | Admitting: Internal Medicine

## 2017-10-11 DIAGNOSIS — I509 Heart failure, unspecified: Secondary | ICD-10-CM | POA: Diagnosis not present

## 2017-10-11 DIAGNOSIS — E1122 Type 2 diabetes mellitus with diabetic chronic kidney disease: Secondary | ICD-10-CM | POA: Diagnosis not present

## 2017-10-11 DIAGNOSIS — L89314 Pressure ulcer of right buttock, stage 4: Secondary | ICD-10-CM | POA: Insufficient documentation

## 2017-10-11 DIAGNOSIS — G35 Multiple sclerosis: Secondary | ICD-10-CM | POA: Diagnosis not present

## 2017-10-11 DIAGNOSIS — S81802A Unspecified open wound, left lower leg, initial encounter: Secondary | ICD-10-CM | POA: Diagnosis not present

## 2017-10-11 DIAGNOSIS — L89313 Pressure ulcer of right buttock, stage 3: Secondary | ICD-10-CM | POA: Diagnosis not present

## 2017-10-11 DIAGNOSIS — I132 Hypertensive heart and chronic kidney disease with heart failure and with stage 5 chronic kidney disease, or end stage renal disease: Secondary | ICD-10-CM | POA: Insufficient documentation

## 2017-10-11 DIAGNOSIS — L97822 Non-pressure chronic ulcer of other part of left lower leg with fat layer exposed: Secondary | ICD-10-CM | POA: Diagnosis not present

## 2017-10-11 DIAGNOSIS — G8220 Paraplegia, unspecified: Secondary | ICD-10-CM | POA: Insufficient documentation

## 2017-10-11 DIAGNOSIS — N186 End stage renal disease: Secondary | ICD-10-CM | POA: Insufficient documentation

## 2017-10-11 DIAGNOSIS — M86651 Other chronic osteomyelitis, right thigh: Secondary | ICD-10-CM | POA: Diagnosis not present

## 2017-10-12 DIAGNOSIS — D631 Anemia in chronic kidney disease: Secondary | ICD-10-CM | POA: Diagnosis not present

## 2017-10-12 DIAGNOSIS — N186 End stage renal disease: Secondary | ICD-10-CM | POA: Diagnosis not present

## 2017-10-12 DIAGNOSIS — N2581 Secondary hyperparathyroidism of renal origin: Secondary | ICD-10-CM | POA: Diagnosis not present

## 2017-10-12 DIAGNOSIS — Z992 Dependence on renal dialysis: Secondary | ICD-10-CM | POA: Diagnosis not present

## 2017-10-14 DIAGNOSIS — N2581 Secondary hyperparathyroidism of renal origin: Secondary | ICD-10-CM | POA: Diagnosis not present

## 2017-10-14 DIAGNOSIS — Z992 Dependence on renal dialysis: Secondary | ICD-10-CM | POA: Diagnosis not present

## 2017-10-14 DIAGNOSIS — D631 Anemia in chronic kidney disease: Secondary | ICD-10-CM | POA: Diagnosis not present

## 2017-10-14 DIAGNOSIS — N186 End stage renal disease: Secondary | ICD-10-CM | POA: Diagnosis not present

## 2017-10-15 LAB — AEROBIC CULTURE  (SUPERFICIAL SPECIMEN)

## 2017-10-15 LAB — AEROBIC CULTURE W GRAM STAIN (SUPERFICIAL SPECIMEN)

## 2017-10-16 DIAGNOSIS — Z992 Dependence on renal dialysis: Secondary | ICD-10-CM | POA: Diagnosis not present

## 2017-10-16 DIAGNOSIS — D631 Anemia in chronic kidney disease: Secondary | ICD-10-CM | POA: Diagnosis not present

## 2017-10-16 DIAGNOSIS — N186 End stage renal disease: Secondary | ICD-10-CM | POA: Diagnosis not present

## 2017-10-16 DIAGNOSIS — N2581 Secondary hyperparathyroidism of renal origin: Secondary | ICD-10-CM | POA: Diagnosis not present

## 2017-10-19 DIAGNOSIS — N186 End stage renal disease: Secondary | ICD-10-CM | POA: Diagnosis not present

## 2017-10-19 DIAGNOSIS — N2581 Secondary hyperparathyroidism of renal origin: Secondary | ICD-10-CM | POA: Diagnosis not present

## 2017-10-19 DIAGNOSIS — Z992 Dependence on renal dialysis: Secondary | ICD-10-CM | POA: Diagnosis not present

## 2017-10-19 DIAGNOSIS — D631 Anemia in chronic kidney disease: Secondary | ICD-10-CM | POA: Diagnosis not present

## 2017-10-21 DIAGNOSIS — N186 End stage renal disease: Secondary | ICD-10-CM | POA: Diagnosis not present

## 2017-10-21 DIAGNOSIS — N2581 Secondary hyperparathyroidism of renal origin: Secondary | ICD-10-CM | POA: Diagnosis not present

## 2017-10-21 DIAGNOSIS — D631 Anemia in chronic kidney disease: Secondary | ICD-10-CM | POA: Diagnosis not present

## 2017-10-21 DIAGNOSIS — Z992 Dependence on renal dialysis: Secondary | ICD-10-CM | POA: Diagnosis not present

## 2017-10-23 DIAGNOSIS — N2581 Secondary hyperparathyroidism of renal origin: Secondary | ICD-10-CM | POA: Diagnosis not present

## 2017-10-23 DIAGNOSIS — N186 End stage renal disease: Secondary | ICD-10-CM | POA: Diagnosis not present

## 2017-10-23 DIAGNOSIS — D631 Anemia in chronic kidney disease: Secondary | ICD-10-CM | POA: Diagnosis not present

## 2017-10-23 DIAGNOSIS — Z992 Dependence on renal dialysis: Secondary | ICD-10-CM | POA: Diagnosis not present

## 2017-10-26 DIAGNOSIS — D631 Anemia in chronic kidney disease: Secondary | ICD-10-CM | POA: Diagnosis not present

## 2017-10-26 DIAGNOSIS — Z992 Dependence on renal dialysis: Secondary | ICD-10-CM | POA: Diagnosis not present

## 2017-10-26 DIAGNOSIS — N2581 Secondary hyperparathyroidism of renal origin: Secondary | ICD-10-CM | POA: Diagnosis not present

## 2017-10-26 DIAGNOSIS — N186 End stage renal disease: Secondary | ICD-10-CM | POA: Diagnosis not present

## 2017-10-28 DIAGNOSIS — Z992 Dependence on renal dialysis: Secondary | ICD-10-CM | POA: Diagnosis not present

## 2017-10-28 DIAGNOSIS — N2581 Secondary hyperparathyroidism of renal origin: Secondary | ICD-10-CM | POA: Diagnosis not present

## 2017-10-28 DIAGNOSIS — N186 End stage renal disease: Secondary | ICD-10-CM | POA: Diagnosis not present

## 2017-10-28 DIAGNOSIS — D631 Anemia in chronic kidney disease: Secondary | ICD-10-CM | POA: Diagnosis not present

## 2017-10-30 DIAGNOSIS — N2581 Secondary hyperparathyroidism of renal origin: Secondary | ICD-10-CM | POA: Diagnosis not present

## 2017-10-30 DIAGNOSIS — N186 End stage renal disease: Secondary | ICD-10-CM | POA: Diagnosis not present

## 2017-10-30 DIAGNOSIS — D631 Anemia in chronic kidney disease: Secondary | ICD-10-CM | POA: Diagnosis not present

## 2017-10-30 DIAGNOSIS — Z992 Dependence on renal dialysis: Secondary | ICD-10-CM | POA: Diagnosis not present

## 2017-11-01 DIAGNOSIS — N319 Neuromuscular dysfunction of bladder, unspecified: Secondary | ICD-10-CM | POA: Diagnosis not present

## 2017-11-01 DIAGNOSIS — Z992 Dependence on renal dialysis: Secondary | ICD-10-CM | POA: Diagnosis not present

## 2017-11-01 DIAGNOSIS — E1122 Type 2 diabetes mellitus with diabetic chronic kidney disease: Secondary | ICD-10-CM | POA: Diagnosis not present

## 2017-11-01 DIAGNOSIS — N186 End stage renal disease: Secondary | ICD-10-CM | POA: Diagnosis not present

## 2017-11-02 DIAGNOSIS — N186 End stage renal disease: Secondary | ICD-10-CM | POA: Diagnosis not present

## 2017-11-02 DIAGNOSIS — Z992 Dependence on renal dialysis: Secondary | ICD-10-CM | POA: Diagnosis not present

## 2017-11-02 DIAGNOSIS — N2581 Secondary hyperparathyroidism of renal origin: Secondary | ICD-10-CM | POA: Diagnosis not present

## 2017-11-02 DIAGNOSIS — Z23 Encounter for immunization: Secondary | ICD-10-CM | POA: Diagnosis not present

## 2017-11-02 DIAGNOSIS — D631 Anemia in chronic kidney disease: Secondary | ICD-10-CM | POA: Diagnosis not present

## 2017-11-04 DIAGNOSIS — N186 End stage renal disease: Secondary | ICD-10-CM | POA: Diagnosis not present

## 2017-11-04 DIAGNOSIS — N2581 Secondary hyperparathyroidism of renal origin: Secondary | ICD-10-CM | POA: Diagnosis not present

## 2017-11-04 DIAGNOSIS — D631 Anemia in chronic kidney disease: Secondary | ICD-10-CM | POA: Diagnosis not present

## 2017-11-04 DIAGNOSIS — Z992 Dependence on renal dialysis: Secondary | ICD-10-CM | POA: Diagnosis not present

## 2017-11-04 DIAGNOSIS — Z23 Encounter for immunization: Secondary | ICD-10-CM | POA: Diagnosis not present

## 2017-11-06 DIAGNOSIS — N2581 Secondary hyperparathyroidism of renal origin: Secondary | ICD-10-CM | POA: Diagnosis not present

## 2017-11-06 DIAGNOSIS — N186 End stage renal disease: Secondary | ICD-10-CM | POA: Diagnosis not present

## 2017-11-06 DIAGNOSIS — Z23 Encounter for immunization: Secondary | ICD-10-CM | POA: Diagnosis not present

## 2017-11-06 DIAGNOSIS — D631 Anemia in chronic kidney disease: Secondary | ICD-10-CM | POA: Diagnosis not present

## 2017-11-06 DIAGNOSIS — Z992 Dependence on renal dialysis: Secondary | ICD-10-CM | POA: Diagnosis not present

## 2017-11-08 ENCOUNTER — Encounter (HOSPITAL_BASED_OUTPATIENT_CLINIC_OR_DEPARTMENT_OTHER): Payer: Medicare Other | Attending: Internal Medicine

## 2017-11-08 DIAGNOSIS — L89314 Pressure ulcer of right buttock, stage 4: Secondary | ICD-10-CM | POA: Insufficient documentation

## 2017-11-08 DIAGNOSIS — G8220 Paraplegia, unspecified: Secondary | ICD-10-CM | POA: Diagnosis not present

## 2017-11-08 DIAGNOSIS — I132 Hypertensive heart and chronic kidney disease with heart failure and with stage 5 chronic kidney disease, or end stage renal disease: Secondary | ICD-10-CM | POA: Insufficient documentation

## 2017-11-08 DIAGNOSIS — N186 End stage renal disease: Secondary | ICD-10-CM | POA: Diagnosis not present

## 2017-11-08 DIAGNOSIS — M86651 Other chronic osteomyelitis, right thigh: Secondary | ICD-10-CM | POA: Diagnosis not present

## 2017-11-08 DIAGNOSIS — I509 Heart failure, unspecified: Secondary | ICD-10-CM | POA: Insufficient documentation

## 2017-11-08 DIAGNOSIS — G35 Multiple sclerosis: Secondary | ICD-10-CM | POA: Diagnosis not present

## 2017-11-08 DIAGNOSIS — Z992 Dependence on renal dialysis: Secondary | ICD-10-CM | POA: Insufficient documentation

## 2017-11-08 DIAGNOSIS — E1122 Type 2 diabetes mellitus with diabetic chronic kidney disease: Secondary | ICD-10-CM | POA: Diagnosis not present

## 2017-11-08 DIAGNOSIS — L89890 Pressure ulcer of other site, unstageable: Secondary | ICD-10-CM | POA: Diagnosis not present

## 2017-11-08 DIAGNOSIS — B9562 Methicillin resistant Staphylococcus aureus infection as the cause of diseases classified elsewhere: Secondary | ICD-10-CM | POA: Diagnosis not present

## 2017-11-08 DIAGNOSIS — L89893 Pressure ulcer of other site, stage 3: Secondary | ICD-10-CM | POA: Insufficient documentation

## 2017-11-08 DIAGNOSIS — L97222 Non-pressure chronic ulcer of left calf with fat layer exposed: Secondary | ICD-10-CM | POA: Diagnosis not present

## 2017-11-09 DIAGNOSIS — N2581 Secondary hyperparathyroidism of renal origin: Secondary | ICD-10-CM | POA: Diagnosis not present

## 2017-11-09 DIAGNOSIS — Z23 Encounter for immunization: Secondary | ICD-10-CM | POA: Diagnosis not present

## 2017-11-09 DIAGNOSIS — N186 End stage renal disease: Secondary | ICD-10-CM | POA: Diagnosis not present

## 2017-11-09 DIAGNOSIS — Z992 Dependence on renal dialysis: Secondary | ICD-10-CM | POA: Diagnosis not present

## 2017-11-09 DIAGNOSIS — D631 Anemia in chronic kidney disease: Secondary | ICD-10-CM | POA: Diagnosis not present

## 2017-11-11 DIAGNOSIS — Z992 Dependence on renal dialysis: Secondary | ICD-10-CM | POA: Diagnosis not present

## 2017-11-11 DIAGNOSIS — N186 End stage renal disease: Secondary | ICD-10-CM | POA: Diagnosis not present

## 2017-11-11 DIAGNOSIS — D631 Anemia in chronic kidney disease: Secondary | ICD-10-CM | POA: Diagnosis not present

## 2017-11-11 DIAGNOSIS — N2581 Secondary hyperparathyroidism of renal origin: Secondary | ICD-10-CM | POA: Diagnosis not present

## 2017-11-11 DIAGNOSIS — Z23 Encounter for immunization: Secondary | ICD-10-CM | POA: Diagnosis not present

## 2017-11-11 DIAGNOSIS — E1122 Type 2 diabetes mellitus with diabetic chronic kidney disease: Secondary | ICD-10-CM | POA: Diagnosis not present

## 2017-11-13 DIAGNOSIS — Z992 Dependence on renal dialysis: Secondary | ICD-10-CM | POA: Diagnosis not present

## 2017-11-13 DIAGNOSIS — D631 Anemia in chronic kidney disease: Secondary | ICD-10-CM | POA: Diagnosis not present

## 2017-11-13 DIAGNOSIS — N2581 Secondary hyperparathyroidism of renal origin: Secondary | ICD-10-CM | POA: Diagnosis not present

## 2017-11-13 DIAGNOSIS — Z23 Encounter for immunization: Secondary | ICD-10-CM | POA: Diagnosis not present

## 2017-11-13 DIAGNOSIS — N186 End stage renal disease: Secondary | ICD-10-CM | POA: Diagnosis not present

## 2017-11-16 DIAGNOSIS — N2581 Secondary hyperparathyroidism of renal origin: Secondary | ICD-10-CM | POA: Diagnosis not present

## 2017-11-16 DIAGNOSIS — D631 Anemia in chronic kidney disease: Secondary | ICD-10-CM | POA: Diagnosis not present

## 2017-11-16 DIAGNOSIS — N186 End stage renal disease: Secondary | ICD-10-CM | POA: Diagnosis not present

## 2017-11-16 DIAGNOSIS — Z23 Encounter for immunization: Secondary | ICD-10-CM | POA: Diagnosis not present

## 2017-11-16 DIAGNOSIS — Z992 Dependence on renal dialysis: Secondary | ICD-10-CM | POA: Diagnosis not present

## 2017-11-18 DIAGNOSIS — N2581 Secondary hyperparathyroidism of renal origin: Secondary | ICD-10-CM | POA: Diagnosis not present

## 2017-11-18 DIAGNOSIS — D631 Anemia in chronic kidney disease: Secondary | ICD-10-CM | POA: Diagnosis not present

## 2017-11-18 DIAGNOSIS — Z992 Dependence on renal dialysis: Secondary | ICD-10-CM | POA: Diagnosis not present

## 2017-11-18 DIAGNOSIS — N186 End stage renal disease: Secondary | ICD-10-CM | POA: Diagnosis not present

## 2017-11-18 DIAGNOSIS — Z23 Encounter for immunization: Secondary | ICD-10-CM | POA: Diagnosis not present

## 2017-11-20 DIAGNOSIS — N186 End stage renal disease: Secondary | ICD-10-CM | POA: Diagnosis not present

## 2017-11-20 DIAGNOSIS — Z992 Dependence on renal dialysis: Secondary | ICD-10-CM | POA: Diagnosis not present

## 2017-11-20 DIAGNOSIS — N2581 Secondary hyperparathyroidism of renal origin: Secondary | ICD-10-CM | POA: Diagnosis not present

## 2017-11-20 DIAGNOSIS — Z23 Encounter for immunization: Secondary | ICD-10-CM | POA: Diagnosis not present

## 2017-11-20 DIAGNOSIS — D631 Anemia in chronic kidney disease: Secondary | ICD-10-CM | POA: Diagnosis not present

## 2017-11-22 DIAGNOSIS — Z0000 Encounter for general adult medical examination without abnormal findings: Secondary | ICD-10-CM | POA: Diagnosis not present

## 2017-11-22 DIAGNOSIS — G35 Multiple sclerosis: Secondary | ICD-10-CM | POA: Diagnosis not present

## 2017-11-22 DIAGNOSIS — N185 Chronic kidney disease, stage 5: Secondary | ICD-10-CM | POA: Diagnosis not present

## 2017-11-22 DIAGNOSIS — Z794 Long term (current) use of insulin: Secondary | ICD-10-CM | POA: Diagnosis not present

## 2017-11-22 DIAGNOSIS — Z992 Dependence on renal dialysis: Secondary | ICD-10-CM | POA: Diagnosis not present

## 2017-11-22 DIAGNOSIS — I129 Hypertensive chronic kidney disease with stage 1 through stage 4 chronic kidney disease, or unspecified chronic kidney disease: Secondary | ICD-10-CM | POA: Diagnosis not present

## 2017-11-22 DIAGNOSIS — Z1389 Encounter for screening for other disorder: Secondary | ICD-10-CM | POA: Diagnosis not present

## 2017-11-22 DIAGNOSIS — E1122 Type 2 diabetes mellitus with diabetic chronic kidney disease: Secondary | ICD-10-CM | POA: Diagnosis not present

## 2017-11-23 DIAGNOSIS — Z23 Encounter for immunization: Secondary | ICD-10-CM | POA: Diagnosis not present

## 2017-11-23 DIAGNOSIS — Z992 Dependence on renal dialysis: Secondary | ICD-10-CM | POA: Diagnosis not present

## 2017-11-23 DIAGNOSIS — D631 Anemia in chronic kidney disease: Secondary | ICD-10-CM | POA: Diagnosis not present

## 2017-11-23 DIAGNOSIS — N2581 Secondary hyperparathyroidism of renal origin: Secondary | ICD-10-CM | POA: Diagnosis not present

## 2017-11-23 DIAGNOSIS — N186 End stage renal disease: Secondary | ICD-10-CM | POA: Diagnosis not present

## 2017-11-25 DIAGNOSIS — N186 End stage renal disease: Secondary | ICD-10-CM | POA: Diagnosis not present

## 2017-11-25 DIAGNOSIS — Z992 Dependence on renal dialysis: Secondary | ICD-10-CM | POA: Diagnosis not present

## 2017-11-25 DIAGNOSIS — Z23 Encounter for immunization: Secondary | ICD-10-CM | POA: Diagnosis not present

## 2017-11-25 DIAGNOSIS — D631 Anemia in chronic kidney disease: Secondary | ICD-10-CM | POA: Diagnosis not present

## 2017-11-25 DIAGNOSIS — N2581 Secondary hyperparathyroidism of renal origin: Secondary | ICD-10-CM | POA: Diagnosis not present

## 2017-11-27 DIAGNOSIS — N2581 Secondary hyperparathyroidism of renal origin: Secondary | ICD-10-CM | POA: Diagnosis not present

## 2017-11-27 DIAGNOSIS — N186 End stage renal disease: Secondary | ICD-10-CM | POA: Diagnosis not present

## 2017-11-27 DIAGNOSIS — Z992 Dependence on renal dialysis: Secondary | ICD-10-CM | POA: Diagnosis not present

## 2017-11-27 DIAGNOSIS — Z23 Encounter for immunization: Secondary | ICD-10-CM | POA: Diagnosis not present

## 2017-11-27 DIAGNOSIS — D631 Anemia in chronic kidney disease: Secondary | ICD-10-CM | POA: Diagnosis not present

## 2017-11-29 DIAGNOSIS — L89890 Pressure ulcer of other site, unstageable: Secondary | ICD-10-CM | POA: Diagnosis not present

## 2017-11-29 DIAGNOSIS — L89314 Pressure ulcer of right buttock, stage 4: Secondary | ICD-10-CM | POA: Diagnosis not present

## 2017-11-29 DIAGNOSIS — N186 End stage renal disease: Secondary | ICD-10-CM | POA: Diagnosis not present

## 2017-11-29 DIAGNOSIS — L89893 Pressure ulcer of other site, stage 3: Secondary | ICD-10-CM | POA: Diagnosis not present

## 2017-11-29 DIAGNOSIS — I132 Hypertensive heart and chronic kidney disease with heart failure and with stage 5 chronic kidney disease, or end stage renal disease: Secondary | ICD-10-CM | POA: Diagnosis not present

## 2017-11-29 DIAGNOSIS — E1122 Type 2 diabetes mellitus with diabetic chronic kidney disease: Secondary | ICD-10-CM | POA: Diagnosis not present

## 2017-11-29 DIAGNOSIS — N319 Neuromuscular dysfunction of bladder, unspecified: Secondary | ICD-10-CM | POA: Diagnosis not present

## 2017-11-30 DIAGNOSIS — Z23 Encounter for immunization: Secondary | ICD-10-CM | POA: Diagnosis not present

## 2017-11-30 DIAGNOSIS — N2581 Secondary hyperparathyroidism of renal origin: Secondary | ICD-10-CM | POA: Diagnosis not present

## 2017-11-30 DIAGNOSIS — Z992 Dependence on renal dialysis: Secondary | ICD-10-CM | POA: Diagnosis not present

## 2017-11-30 DIAGNOSIS — D631 Anemia in chronic kidney disease: Secondary | ICD-10-CM | POA: Diagnosis not present

## 2017-11-30 DIAGNOSIS — N186 End stage renal disease: Secondary | ICD-10-CM | POA: Diagnosis not present

## 2017-12-02 DIAGNOSIS — D509 Iron deficiency anemia, unspecified: Secondary | ICD-10-CM | POA: Diagnosis not present

## 2017-12-02 DIAGNOSIS — N186 End stage renal disease: Secondary | ICD-10-CM | POA: Diagnosis not present

## 2017-12-02 DIAGNOSIS — D631 Anemia in chronic kidney disease: Secondary | ICD-10-CM | POA: Diagnosis not present

## 2017-12-02 DIAGNOSIS — Z992 Dependence on renal dialysis: Secondary | ICD-10-CM | POA: Diagnosis not present

## 2017-12-02 DIAGNOSIS — E1122 Type 2 diabetes mellitus with diabetic chronic kidney disease: Secondary | ICD-10-CM | POA: Diagnosis not present

## 2017-12-02 DIAGNOSIS — N2581 Secondary hyperparathyroidism of renal origin: Secondary | ICD-10-CM | POA: Diagnosis not present

## 2017-12-04 DIAGNOSIS — N186 End stage renal disease: Secondary | ICD-10-CM | POA: Diagnosis not present

## 2017-12-04 DIAGNOSIS — D509 Iron deficiency anemia, unspecified: Secondary | ICD-10-CM | POA: Diagnosis not present

## 2017-12-04 DIAGNOSIS — D631 Anemia in chronic kidney disease: Secondary | ICD-10-CM | POA: Diagnosis not present

## 2017-12-04 DIAGNOSIS — Z992 Dependence on renal dialysis: Secondary | ICD-10-CM | POA: Diagnosis not present

## 2017-12-04 DIAGNOSIS — N2581 Secondary hyperparathyroidism of renal origin: Secondary | ICD-10-CM | POA: Diagnosis not present

## 2017-12-07 DIAGNOSIS — Z992 Dependence on renal dialysis: Secondary | ICD-10-CM | POA: Diagnosis not present

## 2017-12-07 DIAGNOSIS — D509 Iron deficiency anemia, unspecified: Secondary | ICD-10-CM | POA: Diagnosis not present

## 2017-12-07 DIAGNOSIS — N2581 Secondary hyperparathyroidism of renal origin: Secondary | ICD-10-CM | POA: Diagnosis not present

## 2017-12-07 DIAGNOSIS — D631 Anemia in chronic kidney disease: Secondary | ICD-10-CM | POA: Diagnosis not present

## 2017-12-07 DIAGNOSIS — N186 End stage renal disease: Secondary | ICD-10-CM | POA: Diagnosis not present

## 2017-12-09 DIAGNOSIS — N186 End stage renal disease: Secondary | ICD-10-CM | POA: Diagnosis not present

## 2017-12-09 DIAGNOSIS — D631 Anemia in chronic kidney disease: Secondary | ICD-10-CM | POA: Diagnosis not present

## 2017-12-09 DIAGNOSIS — N2581 Secondary hyperparathyroidism of renal origin: Secondary | ICD-10-CM | POA: Diagnosis not present

## 2017-12-09 DIAGNOSIS — D509 Iron deficiency anemia, unspecified: Secondary | ICD-10-CM | POA: Diagnosis not present

## 2017-12-09 DIAGNOSIS — Z992 Dependence on renal dialysis: Secondary | ICD-10-CM | POA: Diagnosis not present

## 2017-12-11 DIAGNOSIS — D631 Anemia in chronic kidney disease: Secondary | ICD-10-CM | POA: Diagnosis not present

## 2017-12-11 DIAGNOSIS — N2581 Secondary hyperparathyroidism of renal origin: Secondary | ICD-10-CM | POA: Diagnosis not present

## 2017-12-11 DIAGNOSIS — D509 Iron deficiency anemia, unspecified: Secondary | ICD-10-CM | POA: Diagnosis not present

## 2017-12-11 DIAGNOSIS — N186 End stage renal disease: Secondary | ICD-10-CM | POA: Diagnosis not present

## 2017-12-11 DIAGNOSIS — Z992 Dependence on renal dialysis: Secondary | ICD-10-CM | POA: Diagnosis not present

## 2017-12-14 DIAGNOSIS — Z992 Dependence on renal dialysis: Secondary | ICD-10-CM | POA: Diagnosis not present

## 2017-12-14 DIAGNOSIS — D509 Iron deficiency anemia, unspecified: Secondary | ICD-10-CM | POA: Diagnosis not present

## 2017-12-14 DIAGNOSIS — D631 Anemia in chronic kidney disease: Secondary | ICD-10-CM | POA: Diagnosis not present

## 2017-12-14 DIAGNOSIS — N2581 Secondary hyperparathyroidism of renal origin: Secondary | ICD-10-CM | POA: Diagnosis not present

## 2017-12-14 DIAGNOSIS — N186 End stage renal disease: Secondary | ICD-10-CM | POA: Diagnosis not present

## 2017-12-16 DIAGNOSIS — Z992 Dependence on renal dialysis: Secondary | ICD-10-CM | POA: Diagnosis not present

## 2017-12-16 DIAGNOSIS — D509 Iron deficiency anemia, unspecified: Secondary | ICD-10-CM | POA: Diagnosis not present

## 2017-12-16 DIAGNOSIS — N2581 Secondary hyperparathyroidism of renal origin: Secondary | ICD-10-CM | POA: Diagnosis not present

## 2017-12-16 DIAGNOSIS — N186 End stage renal disease: Secondary | ICD-10-CM | POA: Diagnosis not present

## 2017-12-16 DIAGNOSIS — D631 Anemia in chronic kidney disease: Secondary | ICD-10-CM | POA: Diagnosis not present

## 2017-12-18 DIAGNOSIS — D631 Anemia in chronic kidney disease: Secondary | ICD-10-CM | POA: Diagnosis not present

## 2017-12-18 DIAGNOSIS — N2581 Secondary hyperparathyroidism of renal origin: Secondary | ICD-10-CM | POA: Diagnosis not present

## 2017-12-18 DIAGNOSIS — D509 Iron deficiency anemia, unspecified: Secondary | ICD-10-CM | POA: Diagnosis not present

## 2017-12-18 DIAGNOSIS — Z992 Dependence on renal dialysis: Secondary | ICD-10-CM | POA: Diagnosis not present

## 2017-12-18 DIAGNOSIS — N186 End stage renal disease: Secondary | ICD-10-CM | POA: Diagnosis not present

## 2017-12-20 ENCOUNTER — Encounter (HOSPITAL_BASED_OUTPATIENT_CLINIC_OR_DEPARTMENT_OTHER): Payer: Medicare Other | Attending: Internal Medicine

## 2017-12-20 ENCOUNTER — Other Ambulatory Visit (HOSPITAL_COMMUNITY)
Admission: RE | Admit: 2017-12-20 | Discharge: 2017-12-20 | Disposition: A | Discharge status: Home / Self Care | Payer: Medicare Other | Source: Other Acute Inpatient Hospital | Attending: Internal Medicine | Admitting: Internal Medicine

## 2017-12-20 DIAGNOSIS — G8220 Paraplegia, unspecified: Secondary | ICD-10-CM | POA: Diagnosis not present

## 2017-12-20 DIAGNOSIS — I509 Heart failure, unspecified: Secondary | ICD-10-CM | POA: Diagnosis not present

## 2017-12-20 DIAGNOSIS — E1122 Type 2 diabetes mellitus with diabetic chronic kidney disease: Secondary | ICD-10-CM | POA: Diagnosis not present

## 2017-12-20 DIAGNOSIS — L89314 Pressure ulcer of right buttock, stage 4: Secondary | ICD-10-CM | POA: Diagnosis not present

## 2017-12-20 DIAGNOSIS — L97523 Non-pressure chronic ulcer of other part of left foot with necrosis of muscle: Secondary | ICD-10-CM | POA: Insufficient documentation

## 2017-12-20 DIAGNOSIS — L03116 Cellulitis of left lower limb: Secondary | ICD-10-CM | POA: Diagnosis not present

## 2017-12-20 DIAGNOSIS — S31829A Unspecified open wound of left buttock, initial encounter: Secondary | ICD-10-CM | POA: Diagnosis not present

## 2017-12-20 DIAGNOSIS — S81802A Unspecified open wound, left lower leg, initial encounter: Secondary | ICD-10-CM | POA: Diagnosis not present

## 2017-12-20 DIAGNOSIS — L89893 Pressure ulcer of other site, stage 3: Secondary | ICD-10-CM | POA: Insufficient documentation

## 2017-12-20 DIAGNOSIS — L89322 Pressure ulcer of left buttock, stage 2: Secondary | ICD-10-CM | POA: Diagnosis not present

## 2017-12-20 DIAGNOSIS — L89313 Pressure ulcer of right buttock, stage 3: Secondary | ICD-10-CM | POA: Diagnosis not present

## 2017-12-20 DIAGNOSIS — L89890 Pressure ulcer of other site, unstageable: Secondary | ICD-10-CM | POA: Diagnosis not present

## 2017-12-20 DIAGNOSIS — S3130XA Unspecified open wound of scrotum and testes, initial encounter: Secondary | ICD-10-CM | POA: Diagnosis not present

## 2017-12-20 DIAGNOSIS — I132 Hypertensive heart and chronic kidney disease with heart failure and with stage 5 chronic kidney disease, or end stage renal disease: Secondary | ICD-10-CM | POA: Insufficient documentation

## 2017-12-20 DIAGNOSIS — G35 Multiple sclerosis: Secondary | ICD-10-CM | POA: Insufficient documentation

## 2017-12-20 DIAGNOSIS — L89892 Pressure ulcer of other site, stage 2: Secondary | ICD-10-CM | POA: Insufficient documentation

## 2017-12-20 DIAGNOSIS — N186 End stage renal disease: Secondary | ICD-10-CM | POA: Diagnosis not present

## 2017-12-21 DIAGNOSIS — D509 Iron deficiency anemia, unspecified: Secondary | ICD-10-CM | POA: Diagnosis not present

## 2017-12-21 DIAGNOSIS — N2581 Secondary hyperparathyroidism of renal origin: Secondary | ICD-10-CM | POA: Diagnosis not present

## 2017-12-21 DIAGNOSIS — D631 Anemia in chronic kidney disease: Secondary | ICD-10-CM | POA: Diagnosis not present

## 2017-12-21 DIAGNOSIS — Z992 Dependence on renal dialysis: Secondary | ICD-10-CM | POA: Diagnosis not present

## 2017-12-21 DIAGNOSIS — N186 End stage renal disease: Secondary | ICD-10-CM | POA: Diagnosis not present

## 2017-12-23 DIAGNOSIS — D509 Iron deficiency anemia, unspecified: Secondary | ICD-10-CM | POA: Diagnosis not present

## 2017-12-23 DIAGNOSIS — D631 Anemia in chronic kidney disease: Secondary | ICD-10-CM | POA: Diagnosis not present

## 2017-12-23 DIAGNOSIS — N2581 Secondary hyperparathyroidism of renal origin: Secondary | ICD-10-CM | POA: Diagnosis not present

## 2017-12-23 DIAGNOSIS — N186 End stage renal disease: Secondary | ICD-10-CM | POA: Diagnosis not present

## 2017-12-23 DIAGNOSIS — Z992 Dependence on renal dialysis: Secondary | ICD-10-CM | POA: Diagnosis not present

## 2017-12-23 LAB — AEROBIC CULTURE  (SUPERFICIAL SPECIMEN)

## 2017-12-23 LAB — AEROBIC CULTURE W GRAM STAIN (SUPERFICIAL SPECIMEN)

## 2017-12-25 DIAGNOSIS — N186 End stage renal disease: Secondary | ICD-10-CM | POA: Diagnosis not present

## 2017-12-25 DIAGNOSIS — Z992 Dependence on renal dialysis: Secondary | ICD-10-CM | POA: Diagnosis not present

## 2017-12-25 DIAGNOSIS — D509 Iron deficiency anemia, unspecified: Secondary | ICD-10-CM | POA: Diagnosis not present

## 2017-12-25 DIAGNOSIS — N2581 Secondary hyperparathyroidism of renal origin: Secondary | ICD-10-CM | POA: Diagnosis not present

## 2017-12-25 DIAGNOSIS — D631 Anemia in chronic kidney disease: Secondary | ICD-10-CM | POA: Diagnosis not present

## 2017-12-27 ENCOUNTER — Other Ambulatory Visit (HOSPITAL_BASED_OUTPATIENT_CLINIC_OR_DEPARTMENT_OTHER): Payer: Self-pay | Admitting: Internal Medicine

## 2017-12-27 ENCOUNTER — Ambulatory Visit (HOSPITAL_COMMUNITY)
Admission: RE | Admit: 2017-12-27 | Discharge: 2017-12-27 | Disposition: A | Discharge status: Home / Self Care | Payer: Medicare Other | Source: Ambulatory Visit | Attending: Internal Medicine | Admitting: Internal Medicine

## 2017-12-27 DIAGNOSIS — M86172 Other acute osteomyelitis, left ankle and foot: Secondary | ICD-10-CM | POA: Diagnosis not present

## 2017-12-27 DIAGNOSIS — B9562 Methicillin resistant Staphylococcus aureus infection as the cause of diseases classified elsewhere: Secondary | ICD-10-CM | POA: Diagnosis not present

## 2017-12-27 DIAGNOSIS — S31829A Unspecified open wound of left buttock, initial encounter: Secondary | ICD-10-CM | POA: Diagnosis not present

## 2017-12-27 DIAGNOSIS — L89313 Pressure ulcer of right buttock, stage 3: Secondary | ICD-10-CM | POA: Diagnosis not present

## 2017-12-27 DIAGNOSIS — E1122 Type 2 diabetes mellitus with diabetic chronic kidney disease: Secondary | ICD-10-CM | POA: Diagnosis not present

## 2017-12-27 DIAGNOSIS — L89893 Pressure ulcer of other site, stage 3: Secondary | ICD-10-CM | POA: Diagnosis not present

## 2017-12-27 DIAGNOSIS — L89322 Pressure ulcer of left buttock, stage 2: Secondary | ICD-10-CM | POA: Diagnosis not present

## 2017-12-27 DIAGNOSIS — L89314 Pressure ulcer of right buttock, stage 4: Secondary | ICD-10-CM | POA: Diagnosis not present

## 2017-12-27 DIAGNOSIS — L89892 Pressure ulcer of other site, stage 2: Secondary | ICD-10-CM | POA: Diagnosis not present

## 2017-12-27 DIAGNOSIS — S81802A Unspecified open wound, left lower leg, initial encounter: Secondary | ICD-10-CM | POA: Diagnosis not present

## 2017-12-27 DIAGNOSIS — Z89432 Acquired absence of left foot: Secondary | ICD-10-CM | POA: Insufficient documentation

## 2017-12-27 DIAGNOSIS — S3130XA Unspecified open wound of scrotum and testes, initial encounter: Secondary | ICD-10-CM | POA: Diagnosis not present

## 2017-12-27 DIAGNOSIS — N319 Neuromuscular dysfunction of bladder, unspecified: Secondary | ICD-10-CM | POA: Diagnosis not present

## 2017-12-27 DIAGNOSIS — L89890 Pressure ulcer of other site, unstageable: Secondary | ICD-10-CM | POA: Diagnosis not present

## 2017-12-28 DIAGNOSIS — N2581 Secondary hyperparathyroidism of renal origin: Secondary | ICD-10-CM | POA: Diagnosis not present

## 2017-12-28 DIAGNOSIS — L89314 Pressure ulcer of right buttock, stage 4: Secondary | ICD-10-CM | POA: Diagnosis not present

## 2017-12-28 DIAGNOSIS — Z794 Long term (current) use of insulin: Secondary | ICD-10-CM | POA: Diagnosis not present

## 2017-12-28 DIAGNOSIS — I120 Hypertensive chronic kidney disease with stage 5 chronic kidney disease or end stage renal disease: Secondary | ICD-10-CM | POA: Diagnosis not present

## 2017-12-28 DIAGNOSIS — D509 Iron deficiency anemia, unspecified: Secondary | ICD-10-CM | POA: Diagnosis not present

## 2017-12-28 DIAGNOSIS — L89323 Pressure ulcer of left buttock, stage 3: Secondary | ICD-10-CM | POA: Diagnosis not present

## 2017-12-28 DIAGNOSIS — L89893 Pressure ulcer of other site, stage 3: Secondary | ICD-10-CM | POA: Diagnosis not present

## 2017-12-28 DIAGNOSIS — Z993 Dependence on wheelchair: Secondary | ICD-10-CM | POA: Diagnosis not present

## 2017-12-28 DIAGNOSIS — D631 Anemia in chronic kidney disease: Secondary | ICD-10-CM | POA: Diagnosis not present

## 2017-12-28 DIAGNOSIS — Z992 Dependence on renal dialysis: Secondary | ICD-10-CM | POA: Diagnosis not present

## 2017-12-28 DIAGNOSIS — N186 End stage renal disease: Secondary | ICD-10-CM | POA: Diagnosis not present

## 2017-12-28 DIAGNOSIS — E1122 Type 2 diabetes mellitus with diabetic chronic kidney disease: Secondary | ICD-10-CM | POA: Diagnosis not present

## 2017-12-28 DIAGNOSIS — G35 Multiple sclerosis: Secondary | ICD-10-CM | POA: Diagnosis not present

## 2017-12-28 DIAGNOSIS — L89890 Pressure ulcer of other site, unstageable: Secondary | ICD-10-CM | POA: Diagnosis not present

## 2017-12-30 DIAGNOSIS — D631 Anemia in chronic kidney disease: Secondary | ICD-10-CM | POA: Diagnosis not present

## 2017-12-30 DIAGNOSIS — Z992 Dependence on renal dialysis: Secondary | ICD-10-CM | POA: Diagnosis not present

## 2017-12-30 DIAGNOSIS — L89314 Pressure ulcer of right buttock, stage 4: Secondary | ICD-10-CM | POA: Diagnosis not present

## 2017-12-30 DIAGNOSIS — E1122 Type 2 diabetes mellitus with diabetic chronic kidney disease: Secondary | ICD-10-CM | POA: Diagnosis not present

## 2017-12-30 DIAGNOSIS — G35 Multiple sclerosis: Secondary | ICD-10-CM | POA: Diagnosis not present

## 2017-12-30 DIAGNOSIS — N186 End stage renal disease: Secondary | ICD-10-CM | POA: Diagnosis not present

## 2017-12-30 DIAGNOSIS — L89323 Pressure ulcer of left buttock, stage 3: Secondary | ICD-10-CM | POA: Diagnosis not present

## 2017-12-30 DIAGNOSIS — N2581 Secondary hyperparathyroidism of renal origin: Secondary | ICD-10-CM | POA: Diagnosis not present

## 2017-12-30 DIAGNOSIS — D509 Iron deficiency anemia, unspecified: Secondary | ICD-10-CM | POA: Diagnosis not present

## 2017-12-30 DIAGNOSIS — L89893 Pressure ulcer of other site, stage 3: Secondary | ICD-10-CM | POA: Diagnosis not present

## 2017-12-30 DIAGNOSIS — L89890 Pressure ulcer of other site, unstageable: Secondary | ICD-10-CM | POA: Diagnosis not present

## 2018-01-01 DIAGNOSIS — D631 Anemia in chronic kidney disease: Secondary | ICD-10-CM | POA: Diagnosis not present

## 2018-01-01 DIAGNOSIS — N2581 Secondary hyperparathyroidism of renal origin: Secondary | ICD-10-CM | POA: Diagnosis not present

## 2018-01-01 DIAGNOSIS — Z992 Dependence on renal dialysis: Secondary | ICD-10-CM | POA: Diagnosis not present

## 2018-01-01 DIAGNOSIS — N186 End stage renal disease: Secondary | ICD-10-CM | POA: Diagnosis not present

## 2018-01-01 DIAGNOSIS — D509 Iron deficiency anemia, unspecified: Secondary | ICD-10-CM | POA: Diagnosis not present

## 2018-01-02 DIAGNOSIS — E1122 Type 2 diabetes mellitus with diabetic chronic kidney disease: Secondary | ICD-10-CM | POA: Diagnosis not present

## 2018-01-02 DIAGNOSIS — Z992 Dependence on renal dialysis: Secondary | ICD-10-CM | POA: Diagnosis not present

## 2018-01-02 DIAGNOSIS — N186 End stage renal disease: Secondary | ICD-10-CM | POA: Diagnosis not present

## 2018-01-04 DIAGNOSIS — L89890 Pressure ulcer of other site, unstageable: Secondary | ICD-10-CM | POA: Diagnosis not present

## 2018-01-04 DIAGNOSIS — Z992 Dependence on renal dialysis: Secondary | ICD-10-CM | POA: Diagnosis not present

## 2018-01-04 DIAGNOSIS — L89893 Pressure ulcer of other site, stage 3: Secondary | ICD-10-CM | POA: Diagnosis not present

## 2018-01-04 DIAGNOSIS — L89323 Pressure ulcer of left buttock, stage 3: Secondary | ICD-10-CM | POA: Diagnosis not present

## 2018-01-04 DIAGNOSIS — D509 Iron deficiency anemia, unspecified: Secondary | ICD-10-CM | POA: Diagnosis not present

## 2018-01-04 DIAGNOSIS — N2581 Secondary hyperparathyroidism of renal origin: Secondary | ICD-10-CM | POA: Diagnosis not present

## 2018-01-04 DIAGNOSIS — E1122 Type 2 diabetes mellitus with diabetic chronic kidney disease: Secondary | ICD-10-CM | POA: Diagnosis not present

## 2018-01-04 DIAGNOSIS — G35 Multiple sclerosis: Secondary | ICD-10-CM | POA: Diagnosis not present

## 2018-01-04 DIAGNOSIS — N186 End stage renal disease: Secondary | ICD-10-CM | POA: Diagnosis not present

## 2018-01-04 DIAGNOSIS — Z23 Encounter for immunization: Secondary | ICD-10-CM | POA: Diagnosis not present

## 2018-01-04 DIAGNOSIS — D631 Anemia in chronic kidney disease: Secondary | ICD-10-CM | POA: Diagnosis not present

## 2018-01-04 DIAGNOSIS — L89314 Pressure ulcer of right buttock, stage 4: Secondary | ICD-10-CM | POA: Diagnosis not present

## 2018-01-06 DIAGNOSIS — D509 Iron deficiency anemia, unspecified: Secondary | ICD-10-CM | POA: Diagnosis not present

## 2018-01-06 DIAGNOSIS — N186 End stage renal disease: Secondary | ICD-10-CM | POA: Diagnosis not present

## 2018-01-06 DIAGNOSIS — N2581 Secondary hyperparathyroidism of renal origin: Secondary | ICD-10-CM | POA: Diagnosis not present

## 2018-01-06 DIAGNOSIS — Z23 Encounter for immunization: Secondary | ICD-10-CM | POA: Diagnosis not present

## 2018-01-06 DIAGNOSIS — D631 Anemia in chronic kidney disease: Secondary | ICD-10-CM | POA: Diagnosis not present

## 2018-01-06 DIAGNOSIS — Z992 Dependence on renal dialysis: Secondary | ICD-10-CM | POA: Diagnosis not present

## 2018-01-07 DIAGNOSIS — L89314 Pressure ulcer of right buttock, stage 4: Secondary | ICD-10-CM | POA: Diagnosis not present

## 2018-01-07 DIAGNOSIS — L89893 Pressure ulcer of other site, stage 3: Secondary | ICD-10-CM | POA: Diagnosis not present

## 2018-01-07 DIAGNOSIS — G35 Multiple sclerosis: Secondary | ICD-10-CM | POA: Diagnosis not present

## 2018-01-07 DIAGNOSIS — E1122 Type 2 diabetes mellitus with diabetic chronic kidney disease: Secondary | ICD-10-CM | POA: Diagnosis not present

## 2018-01-07 DIAGNOSIS — L89890 Pressure ulcer of other site, unstageable: Secondary | ICD-10-CM | POA: Diagnosis not present

## 2018-01-07 DIAGNOSIS — L89323 Pressure ulcer of left buttock, stage 3: Secondary | ICD-10-CM | POA: Diagnosis not present

## 2018-01-08 DIAGNOSIS — Z23 Encounter for immunization: Secondary | ICD-10-CM | POA: Diagnosis not present

## 2018-01-08 DIAGNOSIS — N186 End stage renal disease: Secondary | ICD-10-CM | POA: Diagnosis not present

## 2018-01-08 DIAGNOSIS — D631 Anemia in chronic kidney disease: Secondary | ICD-10-CM | POA: Diagnosis not present

## 2018-01-08 DIAGNOSIS — N2581 Secondary hyperparathyroidism of renal origin: Secondary | ICD-10-CM | POA: Diagnosis not present

## 2018-01-08 DIAGNOSIS — Z992 Dependence on renal dialysis: Secondary | ICD-10-CM | POA: Diagnosis not present

## 2018-01-08 DIAGNOSIS — D509 Iron deficiency anemia, unspecified: Secondary | ICD-10-CM | POA: Diagnosis not present

## 2018-01-10 ENCOUNTER — Encounter (HOSPITAL_BASED_OUTPATIENT_CLINIC_OR_DEPARTMENT_OTHER): Payer: Medicare Other | Attending: Internal Medicine

## 2018-01-10 DIAGNOSIS — G35 Multiple sclerosis: Secondary | ICD-10-CM | POA: Diagnosis not present

## 2018-01-10 DIAGNOSIS — L89892 Pressure ulcer of other site, stage 2: Secondary | ICD-10-CM | POA: Diagnosis not present

## 2018-01-10 DIAGNOSIS — I509 Heart failure, unspecified: Secondary | ICD-10-CM | POA: Insufficient documentation

## 2018-01-10 DIAGNOSIS — N186 End stage renal disease: Secondary | ICD-10-CM | POA: Insufficient documentation

## 2018-01-10 DIAGNOSIS — L97523 Non-pressure chronic ulcer of other part of left foot with necrosis of muscle: Secondary | ICD-10-CM | POA: Diagnosis not present

## 2018-01-10 DIAGNOSIS — E1122 Type 2 diabetes mellitus with diabetic chronic kidney disease: Secondary | ICD-10-CM | POA: Insufficient documentation

## 2018-01-10 DIAGNOSIS — G8220 Paraplegia, unspecified: Secondary | ICD-10-CM | POA: Insufficient documentation

## 2018-01-10 DIAGNOSIS — L89322 Pressure ulcer of left buttock, stage 2: Secondary | ICD-10-CM | POA: Insufficient documentation

## 2018-01-10 DIAGNOSIS — E11621 Type 2 diabetes mellitus with foot ulcer: Secondary | ICD-10-CM | POA: Diagnosis not present

## 2018-01-10 DIAGNOSIS — I132 Hypertensive heart and chronic kidney disease with heart failure and with stage 5 chronic kidney disease, or end stage renal disease: Secondary | ICD-10-CM | POA: Diagnosis not present

## 2018-01-10 DIAGNOSIS — L8993 Pressure ulcer of unspecified site, stage 3: Secondary | ICD-10-CM | POA: Diagnosis not present

## 2018-01-10 DIAGNOSIS — B9562 Methicillin resistant Staphylococcus aureus infection as the cause of diseases classified elsewhere: Secondary | ICD-10-CM | POA: Diagnosis not present

## 2018-01-10 DIAGNOSIS — L89314 Pressure ulcer of right buttock, stage 4: Secondary | ICD-10-CM | POA: Insufficient documentation

## 2018-01-11 DIAGNOSIS — N2581 Secondary hyperparathyroidism of renal origin: Secondary | ICD-10-CM | POA: Diagnosis not present

## 2018-01-11 DIAGNOSIS — Z23 Encounter for immunization: Secondary | ICD-10-CM | POA: Diagnosis not present

## 2018-01-11 DIAGNOSIS — D509 Iron deficiency anemia, unspecified: Secondary | ICD-10-CM | POA: Diagnosis not present

## 2018-01-11 DIAGNOSIS — N186 End stage renal disease: Secondary | ICD-10-CM | POA: Diagnosis not present

## 2018-01-11 DIAGNOSIS — Z992 Dependence on renal dialysis: Secondary | ICD-10-CM | POA: Diagnosis not present

## 2018-01-11 DIAGNOSIS — D631 Anemia in chronic kidney disease: Secondary | ICD-10-CM | POA: Diagnosis not present

## 2018-01-12 DIAGNOSIS — E1122 Type 2 diabetes mellitus with diabetic chronic kidney disease: Secondary | ICD-10-CM | POA: Diagnosis not present

## 2018-01-12 DIAGNOSIS — G35 Multiple sclerosis: Secondary | ICD-10-CM | POA: Diagnosis not present

## 2018-01-12 DIAGNOSIS — L89893 Pressure ulcer of other site, stage 3: Secondary | ICD-10-CM | POA: Diagnosis not present

## 2018-01-12 DIAGNOSIS — L89890 Pressure ulcer of other site, unstageable: Secondary | ICD-10-CM | POA: Diagnosis not present

## 2018-01-12 DIAGNOSIS — L89314 Pressure ulcer of right buttock, stage 4: Secondary | ICD-10-CM | POA: Diagnosis not present

## 2018-01-12 DIAGNOSIS — L89323 Pressure ulcer of left buttock, stage 3: Secondary | ICD-10-CM | POA: Diagnosis not present

## 2018-01-13 DIAGNOSIS — Z992 Dependence on renal dialysis: Secondary | ICD-10-CM | POA: Diagnosis not present

## 2018-01-13 DIAGNOSIS — N2581 Secondary hyperparathyroidism of renal origin: Secondary | ICD-10-CM | POA: Diagnosis not present

## 2018-01-13 DIAGNOSIS — Z23 Encounter for immunization: Secondary | ICD-10-CM | POA: Diagnosis not present

## 2018-01-13 DIAGNOSIS — D509 Iron deficiency anemia, unspecified: Secondary | ICD-10-CM | POA: Diagnosis not present

## 2018-01-13 DIAGNOSIS — N186 End stage renal disease: Secondary | ICD-10-CM | POA: Diagnosis not present

## 2018-01-13 DIAGNOSIS — D631 Anemia in chronic kidney disease: Secondary | ICD-10-CM | POA: Diagnosis not present

## 2018-01-14 DIAGNOSIS — L89314 Pressure ulcer of right buttock, stage 4: Secondary | ICD-10-CM | POA: Diagnosis not present

## 2018-01-14 DIAGNOSIS — L89323 Pressure ulcer of left buttock, stage 3: Secondary | ICD-10-CM | POA: Diagnosis not present

## 2018-01-14 DIAGNOSIS — G35 Multiple sclerosis: Secondary | ICD-10-CM | POA: Diagnosis not present

## 2018-01-14 DIAGNOSIS — L89890 Pressure ulcer of other site, unstageable: Secondary | ICD-10-CM | POA: Diagnosis not present

## 2018-01-14 DIAGNOSIS — L89893 Pressure ulcer of other site, stage 3: Secondary | ICD-10-CM | POA: Diagnosis not present

## 2018-01-14 DIAGNOSIS — E1122 Type 2 diabetes mellitus with diabetic chronic kidney disease: Secondary | ICD-10-CM | POA: Diagnosis not present

## 2018-01-15 DIAGNOSIS — N186 End stage renal disease: Secondary | ICD-10-CM | POA: Diagnosis not present

## 2018-01-15 DIAGNOSIS — D631 Anemia in chronic kidney disease: Secondary | ICD-10-CM | POA: Diagnosis not present

## 2018-01-15 DIAGNOSIS — Z23 Encounter for immunization: Secondary | ICD-10-CM | POA: Diagnosis not present

## 2018-01-15 DIAGNOSIS — N2581 Secondary hyperparathyroidism of renal origin: Secondary | ICD-10-CM | POA: Diagnosis not present

## 2018-01-15 DIAGNOSIS — D509 Iron deficiency anemia, unspecified: Secondary | ICD-10-CM | POA: Diagnosis not present

## 2018-01-15 DIAGNOSIS — Z992 Dependence on renal dialysis: Secondary | ICD-10-CM | POA: Diagnosis not present

## 2018-01-17 DIAGNOSIS — L89314 Pressure ulcer of right buttock, stage 4: Secondary | ICD-10-CM | POA: Diagnosis not present

## 2018-01-17 DIAGNOSIS — L89890 Pressure ulcer of other site, unstageable: Secondary | ICD-10-CM | POA: Diagnosis not present

## 2018-01-17 DIAGNOSIS — G35 Multiple sclerosis: Secondary | ICD-10-CM | POA: Diagnosis not present

## 2018-01-17 DIAGNOSIS — L89893 Pressure ulcer of other site, stage 3: Secondary | ICD-10-CM | POA: Diagnosis not present

## 2018-01-17 DIAGNOSIS — L89323 Pressure ulcer of left buttock, stage 3: Secondary | ICD-10-CM | POA: Diagnosis not present

## 2018-01-17 DIAGNOSIS — E1122 Type 2 diabetes mellitus with diabetic chronic kidney disease: Secondary | ICD-10-CM | POA: Diagnosis not present

## 2018-01-18 DIAGNOSIS — Z992 Dependence on renal dialysis: Secondary | ICD-10-CM | POA: Diagnosis not present

## 2018-01-18 DIAGNOSIS — N2581 Secondary hyperparathyroidism of renal origin: Secondary | ICD-10-CM | POA: Diagnosis not present

## 2018-01-18 DIAGNOSIS — N186 End stage renal disease: Secondary | ICD-10-CM | POA: Diagnosis not present

## 2018-01-18 DIAGNOSIS — Z23 Encounter for immunization: Secondary | ICD-10-CM | POA: Diagnosis not present

## 2018-01-18 DIAGNOSIS — D509 Iron deficiency anemia, unspecified: Secondary | ICD-10-CM | POA: Diagnosis not present

## 2018-01-18 DIAGNOSIS — D631 Anemia in chronic kidney disease: Secondary | ICD-10-CM | POA: Diagnosis not present

## 2018-01-19 DIAGNOSIS — L89323 Pressure ulcer of left buttock, stage 3: Secondary | ICD-10-CM | POA: Diagnosis not present

## 2018-01-19 DIAGNOSIS — L89893 Pressure ulcer of other site, stage 3: Secondary | ICD-10-CM | POA: Diagnosis not present

## 2018-01-19 DIAGNOSIS — L89314 Pressure ulcer of right buttock, stage 4: Secondary | ICD-10-CM | POA: Diagnosis not present

## 2018-01-19 DIAGNOSIS — E1122 Type 2 diabetes mellitus with diabetic chronic kidney disease: Secondary | ICD-10-CM | POA: Diagnosis not present

## 2018-01-19 DIAGNOSIS — G35 Multiple sclerosis: Secondary | ICD-10-CM | POA: Diagnosis not present

## 2018-01-19 DIAGNOSIS — L89890 Pressure ulcer of other site, unstageable: Secondary | ICD-10-CM | POA: Diagnosis not present

## 2018-01-21 DIAGNOSIS — L89890 Pressure ulcer of other site, unstageable: Secondary | ICD-10-CM | POA: Diagnosis not present

## 2018-01-21 DIAGNOSIS — L89323 Pressure ulcer of left buttock, stage 3: Secondary | ICD-10-CM | POA: Diagnosis not present

## 2018-01-21 DIAGNOSIS — E1122 Type 2 diabetes mellitus with diabetic chronic kidney disease: Secondary | ICD-10-CM | POA: Diagnosis not present

## 2018-01-21 DIAGNOSIS — L89314 Pressure ulcer of right buttock, stage 4: Secondary | ICD-10-CM | POA: Diagnosis not present

## 2018-01-21 DIAGNOSIS — L89893 Pressure ulcer of other site, stage 3: Secondary | ICD-10-CM | POA: Diagnosis not present

## 2018-01-21 DIAGNOSIS — G35 Multiple sclerosis: Secondary | ICD-10-CM | POA: Diagnosis not present

## 2018-01-22 DIAGNOSIS — D631 Anemia in chronic kidney disease: Secondary | ICD-10-CM | POA: Diagnosis not present

## 2018-01-22 DIAGNOSIS — Z23 Encounter for immunization: Secondary | ICD-10-CM | POA: Diagnosis not present

## 2018-01-22 DIAGNOSIS — N186 End stage renal disease: Secondary | ICD-10-CM | POA: Diagnosis not present

## 2018-01-22 DIAGNOSIS — N2581 Secondary hyperparathyroidism of renal origin: Secondary | ICD-10-CM | POA: Diagnosis not present

## 2018-01-22 DIAGNOSIS — Z992 Dependence on renal dialysis: Secondary | ICD-10-CM | POA: Diagnosis not present

## 2018-01-22 DIAGNOSIS — D509 Iron deficiency anemia, unspecified: Secondary | ICD-10-CM | POA: Diagnosis not present

## 2018-01-24 DIAGNOSIS — L89314 Pressure ulcer of right buttock, stage 4: Secondary | ICD-10-CM | POA: Diagnosis not present

## 2018-01-24 DIAGNOSIS — L97523 Non-pressure chronic ulcer of other part of left foot with necrosis of muscle: Secondary | ICD-10-CM | POA: Diagnosis not present

## 2018-01-24 DIAGNOSIS — B9562 Methicillin resistant Staphylococcus aureus infection as the cause of diseases classified elsewhere: Secondary | ICD-10-CM | POA: Diagnosis not present

## 2018-01-24 DIAGNOSIS — L89322 Pressure ulcer of left buttock, stage 2: Secondary | ICD-10-CM | POA: Diagnosis not present

## 2018-01-24 DIAGNOSIS — L8993 Pressure ulcer of unspecified site, stage 3: Secondary | ICD-10-CM | POA: Diagnosis not present

## 2018-01-24 DIAGNOSIS — L89892 Pressure ulcer of other site, stage 2: Secondary | ICD-10-CM | POA: Diagnosis not present

## 2018-01-24 DIAGNOSIS — G35 Multiple sclerosis: Secondary | ICD-10-CM | POA: Diagnosis not present

## 2018-01-24 DIAGNOSIS — E11621 Type 2 diabetes mellitus with foot ulcer: Secondary | ICD-10-CM | POA: Diagnosis not present

## 2018-01-25 DIAGNOSIS — N2581 Secondary hyperparathyroidism of renal origin: Secondary | ICD-10-CM | POA: Diagnosis not present

## 2018-01-25 DIAGNOSIS — N186 End stage renal disease: Secondary | ICD-10-CM | POA: Diagnosis not present

## 2018-01-25 DIAGNOSIS — D631 Anemia in chronic kidney disease: Secondary | ICD-10-CM | POA: Diagnosis not present

## 2018-01-25 DIAGNOSIS — Z23 Encounter for immunization: Secondary | ICD-10-CM | POA: Diagnosis not present

## 2018-01-25 DIAGNOSIS — D509 Iron deficiency anemia, unspecified: Secondary | ICD-10-CM | POA: Diagnosis not present

## 2018-01-25 DIAGNOSIS — Z992 Dependence on renal dialysis: Secondary | ICD-10-CM | POA: Diagnosis not present

## 2018-01-27 DIAGNOSIS — L89323 Pressure ulcer of left buttock, stage 3: Secondary | ICD-10-CM | POA: Diagnosis not present

## 2018-01-27 DIAGNOSIS — E1122 Type 2 diabetes mellitus with diabetic chronic kidney disease: Secondary | ICD-10-CM | POA: Diagnosis not present

## 2018-01-27 DIAGNOSIS — G35 Multiple sclerosis: Secondary | ICD-10-CM | POA: Diagnosis not present

## 2018-01-27 DIAGNOSIS — L89314 Pressure ulcer of right buttock, stage 4: Secondary | ICD-10-CM | POA: Diagnosis not present

## 2018-01-27 DIAGNOSIS — N186 End stage renal disease: Secondary | ICD-10-CM | POA: Diagnosis not present

## 2018-01-27 DIAGNOSIS — D509 Iron deficiency anemia, unspecified: Secondary | ICD-10-CM | POA: Diagnosis not present

## 2018-01-27 DIAGNOSIS — L89893 Pressure ulcer of other site, stage 3: Secondary | ICD-10-CM | POA: Diagnosis not present

## 2018-01-27 DIAGNOSIS — L89890 Pressure ulcer of other site, unstageable: Secondary | ICD-10-CM | POA: Diagnosis not present

## 2018-01-27 DIAGNOSIS — Z992 Dependence on renal dialysis: Secondary | ICD-10-CM | POA: Diagnosis not present

## 2018-01-27 DIAGNOSIS — N2581 Secondary hyperparathyroidism of renal origin: Secondary | ICD-10-CM | POA: Diagnosis not present

## 2018-01-27 DIAGNOSIS — Z23 Encounter for immunization: Secondary | ICD-10-CM | POA: Diagnosis not present

## 2018-01-27 DIAGNOSIS — D631 Anemia in chronic kidney disease: Secondary | ICD-10-CM | POA: Diagnosis not present

## 2018-01-28 DIAGNOSIS — G35 Multiple sclerosis: Secondary | ICD-10-CM | POA: Diagnosis not present

## 2018-01-28 DIAGNOSIS — E1122 Type 2 diabetes mellitus with diabetic chronic kidney disease: Secondary | ICD-10-CM | POA: Diagnosis not present

## 2018-01-28 DIAGNOSIS — L89323 Pressure ulcer of left buttock, stage 3: Secondary | ICD-10-CM | POA: Diagnosis not present

## 2018-01-28 DIAGNOSIS — L89890 Pressure ulcer of other site, unstageable: Secondary | ICD-10-CM | POA: Diagnosis not present

## 2018-01-28 DIAGNOSIS — L89314 Pressure ulcer of right buttock, stage 4: Secondary | ICD-10-CM | POA: Diagnosis not present

## 2018-01-28 DIAGNOSIS — L89893 Pressure ulcer of other site, stage 3: Secondary | ICD-10-CM | POA: Diagnosis not present

## 2018-01-29 DIAGNOSIS — N186 End stage renal disease: Secondary | ICD-10-CM | POA: Diagnosis not present

## 2018-01-29 DIAGNOSIS — N2581 Secondary hyperparathyroidism of renal origin: Secondary | ICD-10-CM | POA: Diagnosis not present

## 2018-01-29 DIAGNOSIS — Z992 Dependence on renal dialysis: Secondary | ICD-10-CM | POA: Diagnosis not present

## 2018-01-29 DIAGNOSIS — D509 Iron deficiency anemia, unspecified: Secondary | ICD-10-CM | POA: Diagnosis not present

## 2018-01-29 DIAGNOSIS — Z23 Encounter for immunization: Secondary | ICD-10-CM | POA: Diagnosis not present

## 2018-01-29 DIAGNOSIS — D631 Anemia in chronic kidney disease: Secondary | ICD-10-CM | POA: Diagnosis not present

## 2018-01-31 DIAGNOSIS — L89893 Pressure ulcer of other site, stage 3: Secondary | ICD-10-CM | POA: Diagnosis not present

## 2018-01-31 DIAGNOSIS — G35 Multiple sclerosis: Secondary | ICD-10-CM | POA: Diagnosis not present

## 2018-01-31 DIAGNOSIS — L89314 Pressure ulcer of right buttock, stage 4: Secondary | ICD-10-CM | POA: Diagnosis not present

## 2018-01-31 DIAGNOSIS — L89890 Pressure ulcer of other site, unstageable: Secondary | ICD-10-CM | POA: Diagnosis not present

## 2018-01-31 DIAGNOSIS — L89323 Pressure ulcer of left buttock, stage 3: Secondary | ICD-10-CM | POA: Diagnosis not present

## 2018-01-31 DIAGNOSIS — E1122 Type 2 diabetes mellitus with diabetic chronic kidney disease: Secondary | ICD-10-CM | POA: Diagnosis not present

## 2018-02-01 DIAGNOSIS — L89323 Pressure ulcer of left buttock, stage 3: Secondary | ICD-10-CM | POA: Diagnosis not present

## 2018-02-01 DIAGNOSIS — L89893 Pressure ulcer of other site, stage 3: Secondary | ICD-10-CM | POA: Diagnosis not present

## 2018-02-01 DIAGNOSIS — N319 Neuromuscular dysfunction of bladder, unspecified: Secondary | ICD-10-CM | POA: Diagnosis not present

## 2018-02-01 DIAGNOSIS — L89890 Pressure ulcer of other site, unstageable: Secondary | ICD-10-CM | POA: Diagnosis not present

## 2018-02-01 DIAGNOSIS — Z992 Dependence on renal dialysis: Secondary | ICD-10-CM | POA: Diagnosis not present

## 2018-02-01 DIAGNOSIS — D631 Anemia in chronic kidney disease: Secondary | ICD-10-CM | POA: Diagnosis not present

## 2018-02-01 DIAGNOSIS — E1122 Type 2 diabetes mellitus with diabetic chronic kidney disease: Secondary | ICD-10-CM | POA: Diagnosis not present

## 2018-02-01 DIAGNOSIS — L89314 Pressure ulcer of right buttock, stage 4: Secondary | ICD-10-CM | POA: Diagnosis not present

## 2018-02-01 DIAGNOSIS — N186 End stage renal disease: Secondary | ICD-10-CM | POA: Diagnosis not present

## 2018-02-01 DIAGNOSIS — M869 Osteomyelitis, unspecified: Secondary | ICD-10-CM | POA: Diagnosis not present

## 2018-02-01 DIAGNOSIS — N2581 Secondary hyperparathyroidism of renal origin: Secondary | ICD-10-CM | POA: Diagnosis not present

## 2018-02-01 DIAGNOSIS — G35 Multiple sclerosis: Secondary | ICD-10-CM | POA: Diagnosis not present

## 2018-02-02 DIAGNOSIS — L89314 Pressure ulcer of right buttock, stage 4: Secondary | ICD-10-CM | POA: Diagnosis not present

## 2018-02-02 DIAGNOSIS — L89323 Pressure ulcer of left buttock, stage 3: Secondary | ICD-10-CM | POA: Diagnosis not present

## 2018-02-02 DIAGNOSIS — L89893 Pressure ulcer of other site, stage 3: Secondary | ICD-10-CM | POA: Diagnosis not present

## 2018-02-02 DIAGNOSIS — E1122 Type 2 diabetes mellitus with diabetic chronic kidney disease: Secondary | ICD-10-CM | POA: Diagnosis not present

## 2018-02-02 DIAGNOSIS — G35 Multiple sclerosis: Secondary | ICD-10-CM | POA: Diagnosis not present

## 2018-02-02 DIAGNOSIS — N319 Neuromuscular dysfunction of bladder, unspecified: Secondary | ICD-10-CM | POA: Diagnosis not present

## 2018-02-02 DIAGNOSIS — L89890 Pressure ulcer of other site, unstageable: Secondary | ICD-10-CM | POA: Diagnosis not present

## 2018-02-03 DIAGNOSIS — N2581 Secondary hyperparathyroidism of renal origin: Secondary | ICD-10-CM | POA: Diagnosis not present

## 2018-02-03 DIAGNOSIS — E1122 Type 2 diabetes mellitus with diabetic chronic kidney disease: Secondary | ICD-10-CM | POA: Diagnosis not present

## 2018-02-03 DIAGNOSIS — N186 End stage renal disease: Secondary | ICD-10-CM | POA: Diagnosis not present

## 2018-02-03 DIAGNOSIS — M869 Osteomyelitis, unspecified: Secondary | ICD-10-CM | POA: Diagnosis not present

## 2018-02-03 DIAGNOSIS — D631 Anemia in chronic kidney disease: Secondary | ICD-10-CM | POA: Diagnosis not present

## 2018-02-03 DIAGNOSIS — Z992 Dependence on renal dialysis: Secondary | ICD-10-CM | POA: Diagnosis not present

## 2018-02-04 DIAGNOSIS — L89893 Pressure ulcer of other site, stage 3: Secondary | ICD-10-CM | POA: Diagnosis not present

## 2018-02-04 DIAGNOSIS — G35 Multiple sclerosis: Secondary | ICD-10-CM | POA: Diagnosis not present

## 2018-02-04 DIAGNOSIS — E1122 Type 2 diabetes mellitus with diabetic chronic kidney disease: Secondary | ICD-10-CM | POA: Diagnosis not present

## 2018-02-04 DIAGNOSIS — L89890 Pressure ulcer of other site, unstageable: Secondary | ICD-10-CM | POA: Diagnosis not present

## 2018-02-04 DIAGNOSIS — L89323 Pressure ulcer of left buttock, stage 3: Secondary | ICD-10-CM | POA: Diagnosis not present

## 2018-02-04 DIAGNOSIS — L89314 Pressure ulcer of right buttock, stage 4: Secondary | ICD-10-CM | POA: Diagnosis not present

## 2018-02-05 DIAGNOSIS — N186 End stage renal disease: Secondary | ICD-10-CM | POA: Diagnosis not present

## 2018-02-05 DIAGNOSIS — M869 Osteomyelitis, unspecified: Secondary | ICD-10-CM | POA: Diagnosis not present

## 2018-02-05 DIAGNOSIS — D631 Anemia in chronic kidney disease: Secondary | ICD-10-CM | POA: Diagnosis not present

## 2018-02-05 DIAGNOSIS — Z992 Dependence on renal dialysis: Secondary | ICD-10-CM | POA: Diagnosis not present

## 2018-02-05 DIAGNOSIS — N2581 Secondary hyperparathyroidism of renal origin: Secondary | ICD-10-CM | POA: Diagnosis not present

## 2018-02-07 ENCOUNTER — Other Ambulatory Visit (HOSPITAL_BASED_OUTPATIENT_CLINIC_OR_DEPARTMENT_OTHER): Payer: Self-pay | Admitting: Internal Medicine

## 2018-02-07 ENCOUNTER — Encounter (HOSPITAL_BASED_OUTPATIENT_CLINIC_OR_DEPARTMENT_OTHER): Payer: Medicare Other | Attending: Internal Medicine

## 2018-02-07 ENCOUNTER — Other Ambulatory Visit (HOSPITAL_COMMUNITY)
Admission: RE | Admit: 2018-02-07 | Discharge: 2018-02-07 | Disposition: A | Discharge status: Home / Self Care | Payer: Medicare Other | Source: Other Acute Inpatient Hospital | Attending: Internal Medicine | Admitting: Internal Medicine

## 2018-02-07 DIAGNOSIS — E11621 Type 2 diabetes mellitus with foot ulcer: Secondary | ICD-10-CM | POA: Diagnosis not present

## 2018-02-07 DIAGNOSIS — L89892 Pressure ulcer of other site, stage 2: Secondary | ICD-10-CM | POA: Diagnosis not present

## 2018-02-07 DIAGNOSIS — M898X7 Other specified disorders of bone, ankle and foot: Secondary | ICD-10-CM | POA: Diagnosis not present

## 2018-02-07 DIAGNOSIS — L97422 Non-pressure chronic ulcer of left heel and midfoot with fat layer exposed: Secondary | ICD-10-CM | POA: Insufficient documentation

## 2018-02-07 DIAGNOSIS — L89314 Pressure ulcer of right buttock, stage 4: Secondary | ICD-10-CM | POA: Diagnosis not present

## 2018-02-07 DIAGNOSIS — L97526 Non-pressure chronic ulcer of other part of left foot with bone involvement without evidence of necrosis: Secondary | ICD-10-CM | POA: Diagnosis not present

## 2018-02-07 DIAGNOSIS — Z992 Dependence on renal dialysis: Secondary | ICD-10-CM | POA: Diagnosis not present

## 2018-02-07 DIAGNOSIS — G35 Multiple sclerosis: Secondary | ICD-10-CM | POA: Insufficient documentation

## 2018-02-07 DIAGNOSIS — N186 End stage renal disease: Secondary | ICD-10-CM | POA: Insufficient documentation

## 2018-02-07 DIAGNOSIS — L97512 Non-pressure chronic ulcer of other part of right foot with fat layer exposed: Secondary | ICD-10-CM | POA: Insufficient documentation

## 2018-02-07 DIAGNOSIS — I120 Hypertensive chronic kidney disease with stage 5 chronic kidney disease or end stage renal disease: Secondary | ICD-10-CM | POA: Insufficient documentation

## 2018-02-07 DIAGNOSIS — E1122 Type 2 diabetes mellitus with diabetic chronic kidney disease: Secondary | ICD-10-CM | POA: Diagnosis not present

## 2018-02-07 DIAGNOSIS — L97523 Non-pressure chronic ulcer of other part of left foot with necrosis of muscle: Secondary | ICD-10-CM | POA: Insufficient documentation

## 2018-02-07 DIAGNOSIS — L89322 Pressure ulcer of left buttock, stage 2: Secondary | ICD-10-CM | POA: Insufficient documentation

## 2018-02-09 ENCOUNTER — Ambulatory Visit
Admission: RE | Admit: 2018-02-09 | Discharge: 2018-02-09 | Disposition: A | Discharge status: Home / Self Care | Payer: Medicare Other | Source: Ambulatory Visit | Attending: Internal Medicine | Admitting: Internal Medicine

## 2018-02-09 ENCOUNTER — Other Ambulatory Visit: Payer: Self-pay

## 2018-02-09 ENCOUNTER — Other Ambulatory Visit: Payer: Self-pay | Admitting: Internal Medicine

## 2018-02-09 ENCOUNTER — Encounter (HOSPITAL_COMMUNITY): Payer: Self-pay

## 2018-02-09 ENCOUNTER — Emergency Department (HOSPITAL_COMMUNITY)
Admission: EM | Admit: 2018-02-09 | Discharge: 2018-02-10 | Disposition: A | Discharge status: Home / Self Care | Payer: Medicare Other | Attending: Emergency Medicine | Admitting: Emergency Medicine

## 2018-02-09 DIAGNOSIS — R05 Cough: Secondary | ICD-10-CM

## 2018-02-09 DIAGNOSIS — K068 Other specified disorders of gingiva and edentulous alveolar ridge: Secondary | ICD-10-CM

## 2018-02-09 DIAGNOSIS — R059 Cough, unspecified: Secondary | ICD-10-CM

## 2018-02-09 DIAGNOSIS — Z794 Long term (current) use of insulin: Secondary | ICD-10-CM | POA: Insufficient documentation

## 2018-02-09 DIAGNOSIS — I132 Hypertensive heart and chronic kidney disease with heart failure and with stage 5 chronic kidney disease, or end stage renal disease: Secondary | ICD-10-CM | POA: Insufficient documentation

## 2018-02-09 DIAGNOSIS — N186 End stage renal disease: Secondary | ICD-10-CM | POA: Diagnosis not present

## 2018-02-09 DIAGNOSIS — R197 Diarrhea, unspecified: Secondary | ICD-10-CM | POA: Diagnosis not present

## 2018-02-09 DIAGNOSIS — I509 Heart failure, unspecified: Secondary | ICD-10-CM | POA: Diagnosis not present

## 2018-02-09 DIAGNOSIS — L89890 Pressure ulcer of other site, unstageable: Secondary | ICD-10-CM | POA: Diagnosis not present

## 2018-02-09 DIAGNOSIS — G35 Multiple sclerosis: Secondary | ICD-10-CM | POA: Insufficient documentation

## 2018-02-09 DIAGNOSIS — R58 Hemorrhage, not elsewhere classified: Secondary | ICD-10-CM | POA: Diagnosis not present

## 2018-02-09 DIAGNOSIS — H547 Unspecified visual loss: Secondary | ICD-10-CM | POA: Diagnosis not present

## 2018-02-09 DIAGNOSIS — Z79899 Other long term (current) drug therapy: Secondary | ICD-10-CM | POA: Insufficient documentation

## 2018-02-09 DIAGNOSIS — L89314 Pressure ulcer of right buttock, stage 4: Secondary | ICD-10-CM | POA: Diagnosis not present

## 2018-02-09 DIAGNOSIS — L89893 Pressure ulcer of other site, stage 3: Secondary | ICD-10-CM | POA: Diagnosis not present

## 2018-02-09 DIAGNOSIS — E1122 Type 2 diabetes mellitus with diabetic chronic kidney disease: Secondary | ICD-10-CM | POA: Insufficient documentation

## 2018-02-09 DIAGNOSIS — Z992 Dependence on renal dialysis: Secondary | ICD-10-CM | POA: Diagnosis not present

## 2018-02-09 DIAGNOSIS — Z87891 Personal history of nicotine dependence: Secondary | ICD-10-CM | POA: Insufficient documentation

## 2018-02-09 DIAGNOSIS — L89323 Pressure ulcer of left buttock, stage 3: Secondary | ICD-10-CM | POA: Diagnosis not present

## 2018-02-09 DIAGNOSIS — I10 Essential (primary) hypertension: Secondary | ICD-10-CM | POA: Diagnosis not present

## 2018-02-09 NOTE — ED Triage Notes (Signed)
Pt BIB [facility] for eval of bleeding from mouth. Pt reports spontaneous onset of oral bleeding approx 1 hour PTA while watching TV. Denies anticoagulation, denies known trauma or injury. Airway patent, suction provided to pt. Pt reports he is due for "oral procedure", but unable to elaborate as to what. Satting 97% on RA. GCS 15

## 2018-02-10 DIAGNOSIS — M869 Osteomyelitis, unspecified: Secondary | ICD-10-CM | POA: Diagnosis not present

## 2018-02-10 DIAGNOSIS — N2581 Secondary hyperparathyroidism of renal origin: Secondary | ICD-10-CM | POA: Diagnosis not present

## 2018-02-10 DIAGNOSIS — N186 End stage renal disease: Secondary | ICD-10-CM | POA: Diagnosis not present

## 2018-02-10 DIAGNOSIS — K068 Other specified disorders of gingiva and edentulous alveolar ridge: Secondary | ICD-10-CM | POA: Diagnosis not present

## 2018-02-10 DIAGNOSIS — D631 Anemia in chronic kidney disease: Secondary | ICD-10-CM | POA: Diagnosis not present

## 2018-02-10 DIAGNOSIS — Z992 Dependence on renal dialysis: Secondary | ICD-10-CM | POA: Diagnosis not present

## 2018-02-10 LAB — CBC WITH DIFFERENTIAL/PLATELET
Basophils Absolute: 0 10*3/uL (ref 0.0–0.1)
Basophils Relative: 0 %
EOS ABS: 0.7 10*3/uL — AB (ref 0.0–0.5)
EOS PCT: 4 %
HCT: 8.1 % — ABNORMAL LOW (ref 39.0–52.0)
Hemoglobin: 2.3 g/dL — CL (ref 13.0–17.0)
LYMPHS ABS: 2.3 10*3/uL (ref 0.7–4.0)
Lymphocytes Relative: 12 %
MCH: 29.9 pg (ref 26.0–34.0)
MCHC: 28.4 g/dL — AB (ref 30.0–36.0)
MCV: 105.2 fL — AB (ref 80.0–100.0)
MONOS PCT: 8 %
Monocytes Absolute: 1.6 10*3/uL — ABNORMAL HIGH (ref 0.1–1.0)
Neutro Abs: 14 10*3/uL — ABNORMAL HIGH (ref 1.7–7.7)
Neutrophils Relative %: 75 %
PLATELETS: 405 10*3/uL — AB (ref 150–400)
RBC: <1 MIL/uL — ABNORMAL LOW (ref 4.22–5.81)
RDW: 16.8 % — AB (ref 11.5–15.5)
WBC Morphology: 0
WBC: 18.7 10*3/uL — ABNORMAL HIGH (ref 4.0–10.5)

## 2018-02-10 LAB — BASIC METABOLIC PANEL
Anion gap: 15 (ref 5–15)
BUN: 64 mg/dL — AB (ref 8–23)
CHLORIDE: 107 mmol/L (ref 98–111)
CO2: 16 mmol/L — ABNORMAL LOW (ref 22–32)
Calcium: 8.6 mg/dL — ABNORMAL LOW (ref 8.9–10.3)
Creatinine, Ser: 5.14 mg/dL — ABNORMAL HIGH (ref 0.61–1.24)
GFR calc Af Amer: 11 mL/min — ABNORMAL LOW (ref >60)
GFR calc non Af Amer: 10 mL/min — ABNORMAL LOW (ref >60)
Glucose, Bld: 113 mg/dL — ABNORMAL HIGH (ref 70–99)
Potassium: 4.6 mmol/L (ref 3.5–5.1)
SODIUM: 138 mmol/L (ref 135–145)

## 2018-02-10 LAB — PROTIME-INR
INR: 1.21
PROTHROMBIN TIME: 15.2 s (ref 11.4–15.2)

## 2018-02-10 LAB — TYPE AND SCREEN
ABO/RH(D): A POS
Antibody Screen: NEGATIVE

## 2018-02-10 LAB — I-STAT CHEM 8, ED
BUN: 63 mg/dL — ABNORMAL HIGH (ref 8–23)
CALCIUM ION: 1.09 mmol/L — AB (ref 1.15–1.40)
Chloride: 109 mmol/L (ref 98–111)
Creatinine, Ser: 5.6 mg/dL — ABNORMAL HIGH (ref 0.61–1.24)
GLUCOSE: 112 mg/dL — AB (ref 70–99)
HCT: 26 % — ABNORMAL LOW (ref 39.0–52.0)
HEMOGLOBIN: 8.8 g/dL — AB (ref 13.0–17.0)
Potassium: 4.5 mmol/L (ref 3.5–5.1)
Sodium: 138 mmol/L (ref 135–145)
TCO2: 16 mmol/L — ABNORMAL LOW (ref 22–32)

## 2018-02-10 LAB — SAMPLE TO BLOOD BANK

## 2018-02-10 LAB — HEMOGLOBIN AND HEMATOCRIT, BLOOD
HCT: 27.8 % — ABNORMAL LOW (ref 39.0–52.0)
HEMOGLOBIN: 8.2 g/dL — AB (ref 13.0–17.0)

## 2018-02-10 LAB — APTT: aPTT: 41 seconds — ABNORMAL HIGH (ref 24–36)

## 2018-02-10 MED ORDER — TRANEXAMIC ACID 1000 MG/10ML IV SOLN
500.0000 mg | Freq: Once | INTRAVENOUS | Status: AC
  Administered 2018-02-10: 500 mg via TOPICAL
  Filled 2018-02-10: qty 10

## 2018-02-10 MED ORDER — TRANEXAMIC ACID 1000 MG/10ML IV SOLN: 500.0000 mg | Freq: Once | INTRAVENOUS | Status: DC

## 2018-02-10 NOTE — ED Provider Notes (Signed)
TIME SEEN: 12:13 AM  CHIEF COMPLAINT: Bleeding from mouth  HPI: Patient is a 77 year old male with history of spastic paraplegia secondary to MS, end-stage renal disease on hemodialysis last dialyzed on October 5, cardiomyopathy who presents to the emergency department with complaints of bleeding from the mouth that started approximately 1 hour ago.  Comes in by EMS.  He states he is not on antiplatelets or anticoagulants.  No injury to the mouth.  He did have dental work done approximately 2 weeks ago and is scheduled to have a dental implant placed to the left upper side.  States symptoms started spontaneously tonight.  For dinner he states he ate shrimp, refried beans and rice.  He has never had similar symptoms.  Recently saw his PCP for cough.  Is on azithromycin.  Does not think that he is coughing up blood.  No hematemesis.  ROS: See HPI Constitutional: no fever  Eyes: no drainage  ENT: no runny nose   Cardiovascular:  no chest pain  Resp: no SOB  GI: no vomiting GU: no dysuria Integumentary: no rash  Allergy: no hives  Musculoskeletal: no leg swelling  Neurological: no slurred speech ROS otherwise negative  PAST MEDICAL HISTORY/PAST SURGICAL HISTORY:  Past Medical History:  Diagnosis Date  . Acute CHF (congestive heart failure) ([place]) 02/14/2016  . Acute osteomyelitis, pelvis, right ([place])   . Acute renal failure ([place])   . Anemia in chronic renal disease   . Blood transfusion   . Cardiomyopathy ([place]) 02/17/2016  . Chronic spastic paraplegia    secondary to MS  . Decreased sensation of lower extremity    due to MS  . Decubitus ulcer of ischium   . Decubitus ulcer of trochanter, stage 4 ([place]) 06/14/2015  . ESRD (end stage renal disease) ([place])    [place]  . History of kidney stones    x2  . History of MRSA infection    urine  . History of recurrent UTIs   . Hypertension   . MS (multiple sclerosis) ([place])   . Multiple sclerosis ([place])    stated by  client in triage   . Neuralgia neuritis, sciatic nerve    MS for 30 years  . Neurogenic bladder   . PONV (postoperative nausea and vomiting)   . Presence of indwelling urinary catheter   . Self-catheterizes urinary bladder   . Type 2 diabetes mellitus (HCC)     MEDICATIONS:  Prior to Admission medications   Medication Sig Start Date End Date Taking? Authorizing Provider  acetaminophen (TYLENOL) 325 MG tablet Take 2 tablets (650 mg total) by mouth every 6 (six) hours as needed for mild pain (or Fever >/= 101). 09/09/17   Roxan Hockey, MD  baclofen (LIORESAL) 10 MG tablet Take 1 tablet (10 mg total) by mouth at bedtime as needed for muscle spasms. 07/15/16   Lauree Chandler, NP  carvedilol (COREG) 3.125 MG tablet Take 1 tablet (3.125 mg total) by mouth 2 (two) times daily with a meal. 07/15/16   Eubanks, Carlos American, NP  insulin aspart (NOVOLOG FLEXPEN) 100 UNIT/ML FlexPen Inject 0-9 Units into the skin 3 (three) times daily with meals. Per sliding scale Patient taking differently: Inject 7 Units 3 (three) times daily with meals into the skin. Per sliding scale 07/20/16   Lauree Chandler, NP  insulin detemir (LEVEMIR) 100 UNIT/ML injection Inject 9 Units into the skin daily with breakfast.    [provider]  Insulin Pen Needle (PEN  NEEDLES) 30G X 8 MM MISC Inject 1 each into the skin 4 (four) times daily. . 07/20/16   Lauree Chandler, NP  lidocaine-prilocaine (EMLA) cream Apply 1 application topically as needed. Patient taking differently: Apply 1 application topically as needed (for pain).  07/24/16   Conrad Blackwell, MD  Melatonin 3 MG TABS Take 3 mg by mouth at bedtime.    [provider]  simvastatin (ZOCOR) 20 MG tablet Take 20 mg by mouth every evening.    [provider]  Skin Protectants, Misc. (EUCERIN) cream Apply 1 application daily as needed topically for dry skin. For feet     [provider]    ALLERGIES:  No Known Allergies  SOCIAL  HISTORY:  Social History   Tobacco Use  . Smoking status: Former Smoker    Types: Cigars    Last attempt to quit: 05/06/1983    Years since quitting: 34.7  . Smokeless tobacco: Never Used  Substance Use Topics  . Alcohol use: No    Alcohol/week: 1.0 standard drinks    Types: 1 Glasses of wine per week    Comment: pt has not had any since Dec 2017    FAMILY HISTORY: Family History  Problem Relation Age of Onset  . Stroke Father   . Diabetes Father   . Diabetes Paternal Grandmother   . Stroke Paternal Grandfather   . Heart disease Neg Hx   . Cancer Neg Hx     EXAM: BP (!) 143/70   Pulse 98   Temp 99.5 F (37.5 C) (Oral)   Resp 18   Ht 6\' 4"  (1.93 m)   Wt 77.1 kg   SpO2 91%   BMI 20.69 kg/m  CONSTITUTIONAL: Alert and oriented and responds appropriately to questions. Well-appearing; well-nourished HEAD: Normocephalic EYES: Conjunctivae clear, pupils appear equal, EOMI ENT: normal nose; moist mucous membranes, patient appears to have a slow bleeding from the gums around his lower teeth.  He has a dental implant in the left upper gums but no bleeding around this area.  There is no gum swelling or sign of abscess.  He has no trismus or drooling.  No stridor.  Normal phonation.  Tongue appears normal. NECK: Supple, no meningismus, no nuchal rigidity, no LAD  CARD: RRR; S1 and S2 appreciated; no murmurs, no clicks, no rubs, no gallops RESP: Normal chest excursion without splinting or tachypnea; breath sounds clear and equal bilaterally; no wheezes, no rhonchi, no rales, no hypoxia or respiratory distress, speaking full sentences ABD/GI: Normal bowel sounds; non-distended; soft, non-tender, no rebound, no guarding, no peritoneal signs, no hepatosplenomegaly BACK:  The back appears normal and is non-tender to palpation, there is no CVA tenderness EXT: Normal ROM in all joints; non-tender to palpation; no edema; normal capillary refill; no cyanosis, no calf tenderness or swelling     SKIN: Normal color for age and race; warm; no rash NEURO: Spastic paraplegia, atrophied lower extremities PSYCH: The patient's mood and manner are appropriate. Grooming and personal hygiene are appropriate.  MEDICAL DECISION MAKING: Patient here with oozing from his lower gums.  We will check coags, platelets today.  There is no abscess or sign of infection.  No other lesions noted.  Will use TXA to see if we can stop the bleeding.  No recent dental work or injury to the mouth.  ED PROGRESS: Patient's bleeding has stopped after biting down on TXA soaked gauze for 30 minutes.  His blood counts initially showed a hemoglobin  of 2.3 but when this was repeated showed hemoglobin of 8.2 which is near his baseline.  Normal platelet count.  Normal coags.  Does have a leukocytosis but he is being treated for possible pneumonia with azithromycin.  No hypoxia or shortness of breath here.  He feels comfortable with plan for discharge home with follow-up with his PCP and dentist as an outpatient.  At this time, I do not feel there is any life-threatening condition present. I have reviewed and discussed all results (EKG, imaging, lab, urine as appropriate) and exam findings with patient/family. I have reviewed nursing notes and appropriate previous records.  I feel the patient is safe to be discharged home without further emergent workup and can continue workup as an outpatient as needed. Discussed usual and customary return precautions. Patient/family verbalize understanding and are comfortable with this plan.  Outpatient follow-up has been provided if needed. All questions have been answered.      Icelynn Onken, Delice Bison, DO 02/10/18 0300

## 2018-02-10 NOTE — ED Notes (Signed)
Patient transported to Ultrasound 

## 2018-02-11 DIAGNOSIS — L89323 Pressure ulcer of left buttock, stage 3: Secondary | ICD-10-CM | POA: Diagnosis not present

## 2018-02-11 DIAGNOSIS — L89890 Pressure ulcer of other site, unstageable: Secondary | ICD-10-CM | POA: Diagnosis not present

## 2018-02-11 DIAGNOSIS — G35 Multiple sclerosis: Secondary | ICD-10-CM | POA: Diagnosis not present

## 2018-02-11 DIAGNOSIS — E1122 Type 2 diabetes mellitus with diabetic chronic kidney disease: Secondary | ICD-10-CM | POA: Diagnosis not present

## 2018-02-11 DIAGNOSIS — L89314 Pressure ulcer of right buttock, stage 4: Secondary | ICD-10-CM | POA: Diagnosis not present

## 2018-02-11 DIAGNOSIS — L89893 Pressure ulcer of other site, stage 3: Secondary | ICD-10-CM | POA: Diagnosis not present

## 2018-02-12 DIAGNOSIS — N186 End stage renal disease: Secondary | ICD-10-CM | POA: Diagnosis not present

## 2018-02-12 DIAGNOSIS — Z992 Dependence on renal dialysis: Secondary | ICD-10-CM | POA: Diagnosis not present

## 2018-02-12 DIAGNOSIS — D631 Anemia in chronic kidney disease: Secondary | ICD-10-CM | POA: Diagnosis not present

## 2018-02-12 DIAGNOSIS — N2581 Secondary hyperparathyroidism of renal origin: Secondary | ICD-10-CM | POA: Diagnosis not present

## 2018-02-12 DIAGNOSIS — M869 Osteomyelitis, unspecified: Secondary | ICD-10-CM | POA: Diagnosis not present

## 2018-02-12 LAB — AEROBIC/ANAEROBIC CULTURE W GRAM STAIN (SURGICAL/DEEP WOUND)

## 2018-02-12 LAB — AEROBIC/ANAEROBIC CULTURE (SURGICAL/DEEP WOUND): GRAM STAIN: NONE SEEN

## 2018-02-15 DIAGNOSIS — M869 Osteomyelitis, unspecified: Secondary | ICD-10-CM | POA: Diagnosis not present

## 2018-02-15 DIAGNOSIS — Z992 Dependence on renal dialysis: Secondary | ICD-10-CM | POA: Diagnosis not present

## 2018-02-15 DIAGNOSIS — N186 End stage renal disease: Secondary | ICD-10-CM | POA: Diagnosis not present

## 2018-02-15 DIAGNOSIS — D631 Anemia in chronic kidney disease: Secondary | ICD-10-CM | POA: Diagnosis not present

## 2018-02-15 DIAGNOSIS — N2581 Secondary hyperparathyroidism of renal origin: Secondary | ICD-10-CM | POA: Diagnosis not present

## 2018-02-16 DIAGNOSIS — L89314 Pressure ulcer of right buttock, stage 4: Secondary | ICD-10-CM | POA: Diagnosis not present

## 2018-02-16 DIAGNOSIS — L89323 Pressure ulcer of left buttock, stage 3: Secondary | ICD-10-CM | POA: Diagnosis not present

## 2018-02-16 DIAGNOSIS — L89893 Pressure ulcer of other site, stage 3: Secondary | ICD-10-CM | POA: Diagnosis not present

## 2018-02-16 DIAGNOSIS — G35 Multiple sclerosis: Secondary | ICD-10-CM | POA: Diagnosis not present

## 2018-02-16 DIAGNOSIS — E1122 Type 2 diabetes mellitus with diabetic chronic kidney disease: Secondary | ICD-10-CM | POA: Diagnosis not present

## 2018-02-16 DIAGNOSIS — L89890 Pressure ulcer of other site, unstageable: Secondary | ICD-10-CM | POA: Diagnosis not present

## 2018-02-17 DIAGNOSIS — D631 Anemia in chronic kidney disease: Secondary | ICD-10-CM | POA: Diagnosis not present

## 2018-02-17 DIAGNOSIS — M869 Osteomyelitis, unspecified: Secondary | ICD-10-CM | POA: Diagnosis not present

## 2018-02-17 DIAGNOSIS — N186 End stage renal disease: Secondary | ICD-10-CM | POA: Diagnosis not present

## 2018-02-17 DIAGNOSIS — N2581 Secondary hyperparathyroidism of renal origin: Secondary | ICD-10-CM | POA: Diagnosis not present

## 2018-02-17 DIAGNOSIS — Z992 Dependence on renal dialysis: Secondary | ICD-10-CM | POA: Diagnosis not present

## 2018-02-18 DIAGNOSIS — L89893 Pressure ulcer of other site, stage 3: Secondary | ICD-10-CM | POA: Diagnosis not present

## 2018-02-18 DIAGNOSIS — L89890 Pressure ulcer of other site, unstageable: Secondary | ICD-10-CM | POA: Diagnosis not present

## 2018-02-18 DIAGNOSIS — L89314 Pressure ulcer of right buttock, stage 4: Secondary | ICD-10-CM | POA: Diagnosis not present

## 2018-02-18 DIAGNOSIS — E1122 Type 2 diabetes mellitus with diabetic chronic kidney disease: Secondary | ICD-10-CM | POA: Diagnosis not present

## 2018-02-18 DIAGNOSIS — G35 Multiple sclerosis: Secondary | ICD-10-CM | POA: Diagnosis not present

## 2018-02-18 DIAGNOSIS — L89323 Pressure ulcer of left buttock, stage 3: Secondary | ICD-10-CM | POA: Diagnosis not present

## 2018-02-19 DIAGNOSIS — D631 Anemia in chronic kidney disease: Secondary | ICD-10-CM | POA: Diagnosis not present

## 2018-02-19 DIAGNOSIS — Z992 Dependence on renal dialysis: Secondary | ICD-10-CM | POA: Diagnosis not present

## 2018-02-19 DIAGNOSIS — N186 End stage renal disease: Secondary | ICD-10-CM | POA: Diagnosis not present

## 2018-02-19 DIAGNOSIS — N2581 Secondary hyperparathyroidism of renal origin: Secondary | ICD-10-CM | POA: Diagnosis not present

## 2018-02-19 DIAGNOSIS — M869 Osteomyelitis, unspecified: Secondary | ICD-10-CM | POA: Diagnosis not present

## 2018-02-21 ENCOUNTER — Other Ambulatory Visit (HOSPITAL_BASED_OUTPATIENT_CLINIC_OR_DEPARTMENT_OTHER): Payer: Self-pay | Admitting: Internal Medicine

## 2018-02-21 ENCOUNTER — Other Ambulatory Visit (HOSPITAL_COMMUNITY)
Admission: RE | Admit: 2018-02-21 | Discharge: 2018-02-21 | Disposition: A | Discharge status: Home / Self Care | Payer: Medicare Other | Source: Other Acute Inpatient Hospital | Attending: Internal Medicine | Admitting: Internal Medicine

## 2018-02-21 DIAGNOSIS — L97422 Non-pressure chronic ulcer of left heel and midfoot with fat layer exposed: Secondary | ICD-10-CM | POA: Diagnosis not present

## 2018-02-21 DIAGNOSIS — L89324 Pressure ulcer of left buttock, stage 4: Secondary | ICD-10-CM | POA: Insufficient documentation

## 2018-02-21 DIAGNOSIS — M8668 Other chronic osteomyelitis, other site: Secondary | ICD-10-CM | POA: Diagnosis not present

## 2018-02-21 DIAGNOSIS — L97512 Non-pressure chronic ulcer of other part of right foot with fat layer exposed: Secondary | ICD-10-CM | POA: Diagnosis not present

## 2018-02-21 DIAGNOSIS — L89322 Pressure ulcer of left buttock, stage 2: Secondary | ICD-10-CM | POA: Diagnosis not present

## 2018-02-21 DIAGNOSIS — L89899 Pressure ulcer of other site, unspecified stage: Secondary | ICD-10-CM | POA: Diagnosis not present

## 2018-02-21 DIAGNOSIS — L89892 Pressure ulcer of other site, stage 2: Secondary | ICD-10-CM | POA: Diagnosis not present

## 2018-02-21 DIAGNOSIS — S3130XA Unspecified open wound of scrotum and testes, initial encounter: Secondary | ICD-10-CM | POA: Diagnosis not present

## 2018-02-21 DIAGNOSIS — E11621 Type 2 diabetes mellitus with foot ulcer: Secondary | ICD-10-CM | POA: Diagnosis not present

## 2018-02-21 DIAGNOSIS — B965 Pseudomonas (aeruginosa) (mallei) (pseudomallei) as the cause of diseases classified elsewhere: Secondary | ICD-10-CM | POA: Diagnosis not present

## 2018-02-21 DIAGNOSIS — S31839A Unspecified open wound of anus, initial encounter: Secondary | ICD-10-CM | POA: Diagnosis not present

## 2018-02-21 DIAGNOSIS — L89314 Pressure ulcer of right buttock, stage 4: Secondary | ICD-10-CM | POA: Diagnosis not present

## 2018-02-22 DIAGNOSIS — M869 Osteomyelitis, unspecified: Secondary | ICD-10-CM | POA: Diagnosis not present

## 2018-02-22 DIAGNOSIS — N2581 Secondary hyperparathyroidism of renal origin: Secondary | ICD-10-CM | POA: Diagnosis not present

## 2018-02-22 DIAGNOSIS — Z992 Dependence on renal dialysis: Secondary | ICD-10-CM | POA: Diagnosis not present

## 2018-02-22 DIAGNOSIS — N186 End stage renal disease: Secondary | ICD-10-CM | POA: Diagnosis not present

## 2018-02-22 DIAGNOSIS — D631 Anemia in chronic kidney disease: Secondary | ICD-10-CM | POA: Diagnosis not present

## 2018-02-23 DIAGNOSIS — E1122 Type 2 diabetes mellitus with diabetic chronic kidney disease: Secondary | ICD-10-CM | POA: Diagnosis not present

## 2018-02-23 DIAGNOSIS — L89890 Pressure ulcer of other site, unstageable: Secondary | ICD-10-CM | POA: Diagnosis not present

## 2018-02-23 DIAGNOSIS — G35 Multiple sclerosis: Secondary | ICD-10-CM | POA: Diagnosis not present

## 2018-02-23 DIAGNOSIS — L89314 Pressure ulcer of right buttock, stage 4: Secondary | ICD-10-CM | POA: Diagnosis not present

## 2018-02-23 DIAGNOSIS — L89323 Pressure ulcer of left buttock, stage 3: Secondary | ICD-10-CM | POA: Diagnosis not present

## 2018-02-23 DIAGNOSIS — L89893 Pressure ulcer of other site, stage 3: Secondary | ICD-10-CM | POA: Diagnosis not present

## 2018-02-24 DIAGNOSIS — N186 End stage renal disease: Secondary | ICD-10-CM | POA: Diagnosis not present

## 2018-02-24 DIAGNOSIS — D631 Anemia in chronic kidney disease: Secondary | ICD-10-CM | POA: Diagnosis not present

## 2018-02-24 DIAGNOSIS — Z992 Dependence on renal dialysis: Secondary | ICD-10-CM | POA: Diagnosis not present

## 2018-02-24 DIAGNOSIS — N2581 Secondary hyperparathyroidism of renal origin: Secondary | ICD-10-CM | POA: Diagnosis not present

## 2018-02-24 DIAGNOSIS — M869 Osteomyelitis, unspecified: Secondary | ICD-10-CM | POA: Diagnosis not present

## 2018-02-25 DIAGNOSIS — L89890 Pressure ulcer of other site, unstageable: Secondary | ICD-10-CM | POA: Diagnosis not present

## 2018-02-25 DIAGNOSIS — L89314 Pressure ulcer of right buttock, stage 4: Secondary | ICD-10-CM | POA: Diagnosis not present

## 2018-02-25 DIAGNOSIS — E1122 Type 2 diabetes mellitus with diabetic chronic kidney disease: Secondary | ICD-10-CM | POA: Diagnosis not present

## 2018-02-25 DIAGNOSIS — L89893 Pressure ulcer of other site, stage 3: Secondary | ICD-10-CM | POA: Diagnosis not present

## 2018-02-25 DIAGNOSIS — L89323 Pressure ulcer of left buttock, stage 3: Secondary | ICD-10-CM | POA: Diagnosis not present

## 2018-02-25 DIAGNOSIS — G35 Multiple sclerosis: Secondary | ICD-10-CM | POA: Diagnosis not present

## 2018-02-26 DIAGNOSIS — D631 Anemia in chronic kidney disease: Secondary | ICD-10-CM | POA: Diagnosis not present

## 2018-02-26 DIAGNOSIS — I120 Hypertensive chronic kidney disease with stage 5 chronic kidney disease or end stage renal disease: Secondary | ICD-10-CM | POA: Diagnosis not present

## 2018-02-26 DIAGNOSIS — M869 Osteomyelitis, unspecified: Secondary | ICD-10-CM | POA: Diagnosis not present

## 2018-02-26 DIAGNOSIS — L89314 Pressure ulcer of right buttock, stage 4: Secondary | ICD-10-CM | POA: Diagnosis not present

## 2018-02-26 DIAGNOSIS — Z992 Dependence on renal dialysis: Secondary | ICD-10-CM | POA: Diagnosis not present

## 2018-02-26 DIAGNOSIS — E1122 Type 2 diabetes mellitus with diabetic chronic kidney disease: Secondary | ICD-10-CM | POA: Diagnosis not present

## 2018-02-26 DIAGNOSIS — L89893 Pressure ulcer of other site, stage 3: Secondary | ICD-10-CM | POA: Diagnosis not present

## 2018-02-26 DIAGNOSIS — G35 Multiple sclerosis: Secondary | ICD-10-CM | POA: Diagnosis not present

## 2018-02-26 DIAGNOSIS — L97408 Non-pressure chronic ulcer of unspecified heel and midfoot with other specified severity: Secondary | ICD-10-CM | POA: Diagnosis not present

## 2018-02-26 DIAGNOSIS — N186 End stage renal disease: Secondary | ICD-10-CM | POA: Diagnosis not present

## 2018-02-26 DIAGNOSIS — L89894 Pressure ulcer of other site, stage 4: Secondary | ICD-10-CM | POA: Diagnosis not present

## 2018-02-26 DIAGNOSIS — E11621 Type 2 diabetes mellitus with foot ulcer: Secondary | ICD-10-CM | POA: Diagnosis not present

## 2018-02-26 DIAGNOSIS — L89323 Pressure ulcer of left buttock, stage 3: Secondary | ICD-10-CM | POA: Diagnosis not present

## 2018-02-26 DIAGNOSIS — N2581 Secondary hyperparathyroidism of renal origin: Secondary | ICD-10-CM | POA: Diagnosis not present

## 2018-02-27 LAB — AEROBIC/ANAEROBIC CULTURE W GRAM STAIN (SURGICAL/DEEP WOUND)

## 2018-02-27 LAB — AEROBIC/ANAEROBIC CULTURE (SURGICAL/DEEP WOUND)

## 2018-02-28 DIAGNOSIS — L89893 Pressure ulcer of other site, stage 3: Secondary | ICD-10-CM | POA: Diagnosis not present

## 2018-02-28 DIAGNOSIS — L89323 Pressure ulcer of left buttock, stage 3: Secondary | ICD-10-CM | POA: Diagnosis not present

## 2018-02-28 DIAGNOSIS — L89894 Pressure ulcer of other site, stage 4: Secondary | ICD-10-CM | POA: Diagnosis not present

## 2018-02-28 DIAGNOSIS — E11621 Type 2 diabetes mellitus with foot ulcer: Secondary | ICD-10-CM | POA: Diagnosis not present

## 2018-02-28 DIAGNOSIS — L89314 Pressure ulcer of right buttock, stage 4: Secondary | ICD-10-CM | POA: Diagnosis not present

## 2018-02-28 DIAGNOSIS — L97408 Non-pressure chronic ulcer of unspecified heel and midfoot with other specified severity: Secondary | ICD-10-CM | POA: Diagnosis not present

## 2018-03-01 DIAGNOSIS — Z992 Dependence on renal dialysis: Secondary | ICD-10-CM | POA: Diagnosis not present

## 2018-03-01 DIAGNOSIS — N186 End stage renal disease: Secondary | ICD-10-CM | POA: Diagnosis not present

## 2018-03-01 DIAGNOSIS — D631 Anemia in chronic kidney disease: Secondary | ICD-10-CM | POA: Diagnosis not present

## 2018-03-01 DIAGNOSIS — M869 Osteomyelitis, unspecified: Secondary | ICD-10-CM | POA: Diagnosis not present

## 2018-03-01 DIAGNOSIS — N2581 Secondary hyperparathyroidism of renal origin: Secondary | ICD-10-CM | POA: Diagnosis not present

## 2018-03-02 DIAGNOSIS — L89323 Pressure ulcer of left buttock, stage 3: Secondary | ICD-10-CM | POA: Diagnosis not present

## 2018-03-02 DIAGNOSIS — L97408 Non-pressure chronic ulcer of unspecified heel and midfoot with other specified severity: Secondary | ICD-10-CM | POA: Diagnosis not present

## 2018-03-02 DIAGNOSIS — L89314 Pressure ulcer of right buttock, stage 4: Secondary | ICD-10-CM | POA: Diagnosis not present

## 2018-03-02 DIAGNOSIS — L89893 Pressure ulcer of other site, stage 3: Secondary | ICD-10-CM | POA: Diagnosis not present

## 2018-03-02 DIAGNOSIS — L89894 Pressure ulcer of other site, stage 4: Secondary | ICD-10-CM | POA: Diagnosis not present

## 2018-03-02 DIAGNOSIS — E11621 Type 2 diabetes mellitus with foot ulcer: Secondary | ICD-10-CM | POA: Diagnosis not present

## 2018-03-03 DIAGNOSIS — D631 Anemia in chronic kidney disease: Secondary | ICD-10-CM | POA: Diagnosis not present

## 2018-03-03 DIAGNOSIS — N2581 Secondary hyperparathyroidism of renal origin: Secondary | ICD-10-CM | POA: Diagnosis not present

## 2018-03-03 DIAGNOSIS — M869 Osteomyelitis, unspecified: Secondary | ICD-10-CM | POA: Diagnosis not present

## 2018-03-03 DIAGNOSIS — N186 End stage renal disease: Secondary | ICD-10-CM | POA: Diagnosis not present

## 2018-03-03 DIAGNOSIS — Z992 Dependence on renal dialysis: Secondary | ICD-10-CM | POA: Diagnosis not present

## 2018-03-04 DIAGNOSIS — L89323 Pressure ulcer of left buttock, stage 3: Secondary | ICD-10-CM | POA: Diagnosis not present

## 2018-03-04 DIAGNOSIS — E1122 Type 2 diabetes mellitus with diabetic chronic kidney disease: Secondary | ICD-10-CM | POA: Diagnosis not present

## 2018-03-04 DIAGNOSIS — N2581 Secondary hyperparathyroidism of renal origin: Secondary | ICD-10-CM | POA: Diagnosis not present

## 2018-03-04 DIAGNOSIS — N186 End stage renal disease: Secondary | ICD-10-CM | POA: Diagnosis not present

## 2018-03-04 DIAGNOSIS — D509 Iron deficiency anemia, unspecified: Secondary | ICD-10-CM | POA: Diagnosis not present

## 2018-03-04 DIAGNOSIS — E11621 Type 2 diabetes mellitus with foot ulcer: Secondary | ICD-10-CM | POA: Diagnosis not present

## 2018-03-04 DIAGNOSIS — L89894 Pressure ulcer of other site, stage 4: Secondary | ICD-10-CM | POA: Diagnosis not present

## 2018-03-04 DIAGNOSIS — D631 Anemia in chronic kidney disease: Secondary | ICD-10-CM | POA: Diagnosis not present

## 2018-03-04 DIAGNOSIS — L89893 Pressure ulcer of other site, stage 3: Secondary | ICD-10-CM | POA: Diagnosis not present

## 2018-03-04 DIAGNOSIS — L97408 Non-pressure chronic ulcer of unspecified heel and midfoot with other specified severity: Secondary | ICD-10-CM | POA: Diagnosis not present

## 2018-03-04 DIAGNOSIS — Z992 Dependence on renal dialysis: Secondary | ICD-10-CM | POA: Diagnosis not present

## 2018-03-04 DIAGNOSIS — L89314 Pressure ulcer of right buttock, stage 4: Secondary | ICD-10-CM | POA: Diagnosis not present

## 2018-03-05 ENCOUNTER — Encounter (HOSPITAL_COMMUNITY): Payer: Self-pay | Admitting: Emergency Medicine

## 2018-03-05 ENCOUNTER — Observation Stay (HOSPITAL_COMMUNITY)
Admission: EM | Admit: 2018-03-05 | Discharge: 2018-03-07 | Disposition: A | Discharge status: Home / Self Care | Payer: Medicare Other | Attending: Internal Medicine | Admitting: Internal Medicine

## 2018-03-05 ENCOUNTER — Other Ambulatory Visit: Payer: Self-pay

## 2018-03-05 DIAGNOSIS — E1122 Type 2 diabetes mellitus with diabetic chronic kidney disease: Secondary | ICD-10-CM | POA: Diagnosis not present

## 2018-03-05 DIAGNOSIS — Z794 Long term (current) use of insulin: Secondary | ICD-10-CM | POA: Insufficient documentation

## 2018-03-05 DIAGNOSIS — G8220 Paraplegia, unspecified: Secondary | ICD-10-CM | POA: Insufficient documentation

## 2018-03-05 DIAGNOSIS — E11621 Type 2 diabetes mellitus with foot ulcer: Secondary | ICD-10-CM | POA: Insufficient documentation

## 2018-03-05 DIAGNOSIS — F329 Major depressive disorder, single episode, unspecified: Secondary | ICD-10-CM | POA: Diagnosis not present

## 2018-03-05 DIAGNOSIS — G35 Multiple sclerosis: Secondary | ICD-10-CM | POA: Diagnosis not present

## 2018-03-05 DIAGNOSIS — L89154 Pressure ulcer of sacral region, stage 4: Secondary | ICD-10-CM | POA: Insufficient documentation

## 2018-03-05 DIAGNOSIS — L89892 Pressure ulcer of other site, stage 2: Secondary | ICD-10-CM | POA: Diagnosis not present

## 2018-03-05 DIAGNOSIS — Z8614 Personal history of Methicillin resistant Staphylococcus aureus infection: Secondary | ICD-10-CM | POA: Insufficient documentation

## 2018-03-05 DIAGNOSIS — I132 Hypertensive heart and chronic kidney disease with heart failure and with stage 5 chronic kidney disease, or end stage renal disease: Secondary | ICD-10-CM | POA: Diagnosis not present

## 2018-03-05 DIAGNOSIS — N319 Neuromuscular dysfunction of bladder, unspecified: Secondary | ICD-10-CM | POA: Insufficient documentation

## 2018-03-05 DIAGNOSIS — R5381 Other malaise: Secondary | ICD-10-CM | POA: Diagnosis not present

## 2018-03-05 DIAGNOSIS — Z79899 Other long term (current) drug therapy: Secondary | ICD-10-CM | POA: Insufficient documentation

## 2018-03-05 DIAGNOSIS — E785 Hyperlipidemia, unspecified: Secondary | ICD-10-CM | POA: Insufficient documentation

## 2018-03-05 DIAGNOSIS — N186 End stage renal disease: Secondary | ICD-10-CM

## 2018-03-05 DIAGNOSIS — D631 Anemia in chronic kidney disease: Secondary | ICD-10-CM | POA: Diagnosis not present

## 2018-03-05 DIAGNOSIS — I5021 Acute systolic (congestive) heart failure: Secondary | ICD-10-CM | POA: Diagnosis not present

## 2018-03-05 DIAGNOSIS — F411 Generalized anxiety disorder: Secondary | ICD-10-CM | POA: Insufficient documentation

## 2018-03-05 DIAGNOSIS — Z993 Dependence on wheelchair: Secondary | ICD-10-CM | POA: Insufficient documentation

## 2018-03-05 DIAGNOSIS — Z992 Dependence on renal dialysis: Secondary | ICD-10-CM | POA: Diagnosis not present

## 2018-03-05 DIAGNOSIS — L89322 Pressure ulcer of left buttock, stage 2: Secondary | ICD-10-CM | POA: Diagnosis not present

## 2018-03-05 DIAGNOSIS — E11622 Type 2 diabetes mellitus with other skin ulcer: Secondary | ICD-10-CM | POA: Diagnosis not present

## 2018-03-05 DIAGNOSIS — Z87442 Personal history of urinary calculi: Secondary | ICD-10-CM | POA: Insufficient documentation

## 2018-03-05 DIAGNOSIS — D649 Anemia, unspecified: Secondary | ICD-10-CM

## 2018-03-05 DIAGNOSIS — D62 Acute posthemorrhagic anemia: Secondary | ICD-10-CM | POA: Insufficient documentation

## 2018-03-05 DIAGNOSIS — L97529 Non-pressure chronic ulcer of other part of left foot with unspecified severity: Secondary | ICD-10-CM | POA: Diagnosis not present

## 2018-03-05 DIAGNOSIS — F17290 Nicotine dependence, other tobacco product, uncomplicated: Secondary | ICD-10-CM | POA: Insufficient documentation

## 2018-03-05 DIAGNOSIS — I493 Ventricular premature depolarization: Secondary | ICD-10-CM | POA: Diagnosis not present

## 2018-03-05 LAB — BASIC METABOLIC PANEL
Anion gap: 8 (ref 5–15)
Anion gap: 6 (ref 5–15)
BUN: 39 mg/dL — AB (ref 8–23)
BUN: 12 mg/dL (ref 8–23)
CO2: 27 mmol/L (ref 22–32)
CO2: 29 mmol/L (ref 22–32)
CREATININE: 3.25 mg/dL — AB (ref 0.61–1.24)
Calcium: 8.5 mg/dL — ABNORMAL LOW (ref 8.9–10.3)
Calcium: 7.8 mg/dL — ABNORMAL LOW (ref 8.9–10.3)
Chloride: 103 mmol/L (ref 98–111)
Chloride: 99 mmol/L (ref 98–111)
Creatinine, Ser: 1.44 mg/dL — ABNORMAL HIGH (ref 0.61–1.24)
GFR calc Af Amer: 20 mL/min — ABNORMAL LOW (ref >60)
GFR calc Af Amer: 53 mL/min — ABNORMAL LOW (ref >60)
GFR calc non Af Amer: 46 mL/min — ABNORMAL LOW (ref >60)
GFR, EST NON AFRICAN AMERICAN: 17 mL/min — AB (ref >60)
GLUCOSE: 142 mg/dL — AB (ref 70–99)
Glucose, Bld: 133 mg/dL — ABNORMAL HIGH (ref 70–99)
POTASSIUM: 4 mmol/L (ref 3.5–5.1)
Potassium: 3.3 mmol/L — ABNORMAL LOW (ref 3.5–5.1)
Sodium: 138 mmol/L (ref 135–145)
Sodium: 134 mmol/L — ABNORMAL LOW (ref 135–145)

## 2018-03-05 LAB — POC OCCULT BLOOD, ED: Fecal Occult Bld: NEGATIVE

## 2018-03-05 LAB — CBC WITH DIFFERENTIAL/PLATELET
ABS IMMATURE GRANULOCYTES: 0.03 10*3/uL (ref 0.00–0.07)
BASOS PCT: 0 %
Basophils Absolute: 0 10*3/uL (ref 0.0–0.1)
EOS ABS: 0.7 10*3/uL — AB (ref 0.0–0.5)
EOS PCT: 9 %
HCT: 22.6 % — ABNORMAL LOW (ref 39.0–52.0)
Hemoglobin: 6.5 g/dL — CL (ref 13.0–17.0)
Immature Granulocytes: 0 %
Lymphocytes Relative: 15 %
Lymphs Abs: 1.2 10*3/uL (ref 0.7–4.0)
MCH: 30.4 pg (ref 26.0–34.0)
MCHC: 28.8 g/dL — AB (ref 30.0–36.0)
MCV: 105.6 fL — AB (ref 80.0–100.0)
MONO ABS: 0.7 10*3/uL (ref 0.1–1.0)
MONOS PCT: 9 %
NEUTROS ABS: 5.5 10*3/uL (ref 1.7–7.7)
Neutrophils Relative %: 67 %
PLATELETS: 234 10*3/uL (ref 150–400)
RBC: 2.14 MIL/uL — ABNORMAL LOW (ref 4.22–5.81)
RDW: 17.2 % — ABNORMAL HIGH (ref 11.5–15.5)
WBC: 8.3 10*3/uL (ref 4.0–10.5)
nRBC: 0 % (ref 0.0–0.2)

## 2018-03-05 LAB — GLUCOSE, CAPILLARY: GLUCOSE-CAPILLARY: 144 mg/dL — AB (ref 70–99)

## 2018-03-05 LAB — HEMOGLOBIN AND HEMATOCRIT, BLOOD
HCT: 30.1 % — ABNORMAL LOW (ref 39.0–52.0)
Hemoglobin: 9.2 g/dL — ABNORMAL LOW (ref 13.0–17.0)

## 2018-03-05 LAB — PREPARE RBC (CROSSMATCH)

## 2018-03-05 MED ORDER — INSULIN DETEMIR 100 UNIT/ML ~~LOC~~ SOLN
9.0000 [IU] | Freq: Every day | SUBCUTANEOUS | Status: DC
  Administered 2018-03-06: 9 [IU] via SUBCUTANEOUS
  Filled 2018-03-05 (×2): qty 0.09

## 2018-03-05 MED ORDER — LIDOCAINE-PRILOCAINE 2.5-2.5 % EX CREA: 1.0000 "application " | TOPICAL_CREAM | CUTANEOUS | Status: DC | PRN

## 2018-03-05 MED ORDER — INSULIN ASPART 100 UNIT/ML ~~LOC~~ SOLN
7.0000 [IU] | Freq: Three times a day (TID) | SUBCUTANEOUS | Status: DC
  Administered 2018-03-06 (×2): 7 [IU] via SUBCUTANEOUS

## 2018-03-05 MED ORDER — PENTAFLUOROPROP-TETRAFLUOROETH EX AERO: 1.0000 "application " | INHALATION_SPRAY | CUTANEOUS | Status: DC | PRN

## 2018-03-05 MED ORDER — SODIUM CHLORIDE 0.9 % IV SOLN: 100.0000 mL | INTRAVENOUS | Status: DC | PRN

## 2018-03-05 MED ORDER — POLYETHYLENE GLYCOL 3350 17 G PO PACK: 17.0000 g | PACK | Freq: Every day | ORAL | Status: DC | PRN

## 2018-03-05 MED ORDER — DOXERCALCIFEROL 4 MCG/2ML IV SOLN
INTRAVENOUS | Status: AC
  Filled 2018-03-05: qty 2

## 2018-03-05 MED ORDER — CHLORHEXIDINE GLUCONATE CLOTH 2 % EX PADS
6.0000 | MEDICATED_PAD | Freq: Every day | CUTANEOUS | Status: DC
  Administered 2018-03-05 – 2018-03-07 (×2): 6 via TOPICAL

## 2018-03-05 MED ORDER — MELATONIN 3 MG PO TABS
3.0000 mg | ORAL_TABLET | Freq: Every day | ORAL | Status: DC
  Administered 2018-03-05 – 2018-03-06 (×2): 3 mg via ORAL
  Filled 2018-03-05 (×2): qty 1

## 2018-03-05 MED ORDER — SIMVASTATIN 20 MG PO TABS
20.0000 mg | ORAL_TABLET | Freq: Every evening | ORAL | Status: DC
  Administered 2018-03-05 – 2018-03-06 (×2): 20 mg via ORAL
  Filled 2018-03-05 (×2): qty 1

## 2018-03-05 MED ORDER — LIDOCAINE HCL (PF) 1 % IJ SOLN: 5.0000 mL | INTRAMUSCULAR | Status: DC | PRN

## 2018-03-05 MED ORDER — ACETAMINOPHEN 325 MG PO TABS: 650.0000 mg | ORAL_TABLET | Freq: Four times a day (QID) | ORAL | Status: DC | PRN

## 2018-03-05 MED ORDER — SODIUM CHLORIDE 0.9% IV SOLUTION: Freq: Once | INTRAVENOUS | Status: DC

## 2018-03-05 MED ORDER — CARVEDILOL 3.125 MG PO TABS
3.1250 mg | ORAL_TABLET | Freq: Two times a day (BID) | ORAL | Status: DC
  Administered 2018-03-05 – 2018-03-07 (×4): 3.125 mg via ORAL
  Filled 2018-03-05 (×4): qty 1

## 2018-03-05 MED ORDER — DOXERCALCIFEROL 4 MCG/2ML IV SOLN
1.0000 ug | INTRAVENOUS | Status: DC
  Administered 2018-03-05: 1 ug via INTRAVENOUS

## 2018-03-05 NOTE — H&P (Signed)
History and Physical    DOA: 03/05/2018  PCP: Lavone Orn, MD  Patient coming from: Home  Chief Complaint: Anemia  HPI: Lance [name] is a 77 y.o. male with history h/o end-stage renal disease on hemodialysis and multiple medical problems as listed below presents upon his nephrologist direction for blood transfusion.  Patient reports that he has chronic pressure ulcers in bilateral feet/sacrum with chronic oozing of blood which periodically results in anemia requiring blood transfusions.  He states he had his hemoglobin level checked on Tuesday which resulted at 7.5.  It was drawn again on Thursday for follow-up as he complained of feeling weak and tired.  He was called in with results today with hemoglobin of 6.5 and recommended to come to ED for blood transfusion.  Patient denies any melena, hematochezia, abdominal pain or hematemesis or nausea vomiting.  He denies any chest pain or shortness of breath or signs of fluid overload. He denies any fever or chills. He has been compliant with his medications and dialysis treatments.  He follows wound care clinic at [facility] and apparently has an appointment on Monday for follow-up.  He also has home wound care on a weekly basis.  His last wound check/dressing change was yesterday with no acute issues noted.    Review of Systems: As per HPI otherwise 10 point review of systems negative.    Past Medical History:  Diagnosis Date  . Acute CHF (congestive heart failure) ([place]) 02/14/2016  . Acute osteomyelitis, pelvis, right ([place])   . Acute renal failure ([place])   . Anemia in chronic renal disease   . Blood transfusion   . Cardiomyopathy ([place]) 02/17/2016  . Chronic spastic paraplegia    secondary to MS  . Decreased sensation of lower extremity    due to MS  . Decubitus ulcer of ischium   . Decubitus ulcer of trochanter, stage 4 ([place]) 06/14/2015  . ESRD (end stage renal disease) ([place])    [place]  . History of kidney  stones    x2  . History of MRSA infection    urine  . History of recurrent UTIs   . Hypertension   . MS (multiple sclerosis) ([place])   . Multiple sclerosis ([place])    stated by client in triage   . Neuralgia neuritis, sciatic nerve    MS for 30 years  . Neurogenic bladder   . PONV (postoperative nausea and vomiting)   . Presence of indwelling urinary catheter   . Self-catheterizes urinary bladder   . Type 2 diabetes mellitus ([place])     Past Surgical History:  Procedure Laterality Date  . AMPUTATION Left 01/22/2017   Procedure: Left 5th Ray Amputation;  Surgeon: Newt Minion, MD;  Location: [facility];  Service: Orthopedics;  Laterality: Left;  . APPLICATION OF A-CELL OF EXTREMITY Left 09/05/2014   Procedure: PLACEMENT OF ACELL AND VAC;  Surgeon: Theodoro Kos, DO;  Location: [place];  Service: Plastics;  Laterality: Left;  . BASCILIC VEIN TRANSPOSITION Right 03/24/2016   Procedure: RADIAL- CEPHALIC FISTULA CREATION RIGHT ARM;  Surgeon: Conrad Hiwassee, MD;  Location: [facility];  Service: Vascular;  Laterality: Right;  . Buttocks flap Left   . COLONOSCOPY    . EXTRACORPOREAL SHOCK WAVE LITHOTRIPSY  03/23/2011   x2  . FINGER SURGERY  2004  . hip flap Right   . INCISION AND DRAINAGE OF WOUND Left 09/05/2014   Procedure: IRRIGATION AND DEBRIDEMENT OF LEFT ISCHIUM WOUND WITH ;  Surgeon: Theodoro Kos, DO;  Location: [facility];  Service: Plastics;  Laterality: Left;  . INSERTION OF DIALYSIS CATHETER N/A 02/27/2016   Procedure: INSERTION OF DIALYSIS CATHETER-RIGHT INTERNAL JUGULA PLACEMENT;  Surgeon: Conrad Hoffman, MD;  Location: [facility];  Service: Vascular;  Laterality: N/A;  . TONSILLECTOMY    . WOUND DEBRIDEMENT     [facility]2/17[facility]     Social history:  reports that he quit smoking about 34 years ago. His smoking use included cigars. He has never used smokeless tobacco. He reports that he does not drink alcohol or use drugs.   No Known Allergies  Family History  Problem Relation Age of Onset  . Stroke  Father   . Diabetes Father   . Diabetes Paternal Grandmother   . Stroke Paternal Grandfather   . Heart disease Neg Hx   . Cancer Neg Hx       Prior to Admission medications   Medication Sig Start Date End Date Taking? Authorizing Provider  acetaminophen (TYLENOL) 325 MG tablet Take 2 tablets (650 mg total) by mouth every 6 (six) hours as needed for mild pain (or Fever >/= 101). 09/09/17  Yes Emokpae, Courage, MD  baclofen (LIORESAL) 10 MG tablet Take 1 tablet (10 mg total) by mouth at bedtime as needed for muscle spasms. Patient taking differently: Take 10 mg by mouth daily.  07/15/16  Yes Lauree Chandler, NP  carvedilol (COREG) 3.125 MG tablet Take 1 tablet (3.125 mg total) by mouth 2 (two) times daily with a meal. 07/15/16  Yes Eubanks, Carlos American, NP  insulin aspart (NOVOLOG FLEXPEN) 100 UNIT/ML FlexPen Inject 0-9 Units into the skin 3 (three) times daily with meals. Per sliding scale Patient taking differently: Inject 7 Units 3 (three) times daily with meals into the skin. Per sliding scale 07/20/16  Yes Lauree Chandler, NP  insulin detemir (LEVEMIR) 100 UNIT/ML injection Inject 9 Units into the skin daily with breakfast.   Yes [provider]  lidocaine-prilocaine (EMLA) cream Apply 1 application topically as needed. Patient taking differently: Apply 1 application topically as needed (for pain).  07/24/16  Yes Conrad Neshkoro, MD  Melatonin 3 MG TABS Take 3 mg by mouth at bedtime.   Yes [provider]  midodrine (PROAMATINE) 10 MG tablet Take 10 mg by mouth as needed. 01/26/18  Yes [provider]  SANTYL ointment Apply 1 application topically as needed. 01/13/18  Yes [provider]  simvastatin (ZOCOR) 20 MG tablet Take 20 mg by mouth every evening.   Yes [provider]  Skin Protectants, Misc. (EUCERIN) cream Apply 1 application daily as needed topically for dry skin. For feet    Yes [provider]  trimethoprim (TRIMPEX) 100 MG  tablet Take 100 mg by mouth daily. 12/22/17  Yes [provider]  Insulin Pen Needle (PEN NEEDLES) 30G X 8 MM MISC Inject 1 each into the skin 4 (four) times daily. . 07/20/16   Lauree Chandler, NP    Physical Exam: Vitals:   03/05/18 1100 03/05/18 1107 03/05/18 1130 03/05/18 1200  BP: 133/62 (!) 125/59 (!) 123/54 (!) 124/57  Pulse: 86 85 82 85  Resp: 15 16 (!) 24 (!) 26  Temp:  98.5 F (36.9 C)    TempSrc:  Oral    SpO2: 97% 97% 96% 95%  Weight:      Height:        Constitutional: NAD, calm, comfortable Vitals:   03/05/18 1100 03/05/18 1107 03/05/18 1130  03/05/18 1200  BP: 133/62 (!) 125/59 (!) 123/54 (!) 124/57  Pulse: 86 85 82 85  Resp: 15 16 (!) 24 (!) 26  Temp:  98.5 F (36.9 C)    TempSrc:  Oral    SpO2: 97% 97% 96% 95%  Weight:      Height:       Eyes: PERRL, lids and conjunctivae normal ENMT: Mucous membranes are moist. Posterior pharynx clear of any exudate or lesions.Normal dentition.  Neck: normal, supple, no masses, no thyromegaly Respiratory: clear to auscultation bilaterally, no wheezing, no crackles. Normal respiratory effort. No accessory muscle use.  Cardiovascular: Regular rate and rhythm, no murmurs / rubs / gallops. No extremity edema. 2+ pedal pulses. No carotid bruits.  Abdomen: no tenderness, no masses palpated. No hepatosplenomegaly. Bowel sounds positive.  Musculoskeletal: AV fistula in right forearm  Neurologic: CN 2-12 grossly intact.  Paraplegic from [place]. Psychiatric: Normal judgment and insight. Alert and oriented x 3. Normal mood.  SKIN/catheters: Pressure ulcers on plantar aspect bilateral feet, 2 sacral decubiti with no signs of active infection  Labs on Admission: I have personally reviewed following labs and imaging studies  CBC: Recent Labs  Lab 03/05/18 0838  WBC 8.3  NEUTROABS 5.5  HGB 6.5*  HCT 22.6*  MCV 105.6*  PLT 998   Basic Metabolic Panel: Recent Labs  Lab 03/05/18 0838  NA 138  K 4.0  CL 103  CO2 27    GLUCOSE 142*  BUN 39*  CREATININE 3.25*  CALCIUM 8.5*   GFR: Estimated Creatinine Clearance: 20.4 mL/min (A) (by C-G formula based on SCr of 3.25 mg/dL (H)). Liver Function Tests: No results for input(s): AST, ALT, ALKPHOS, BILITOT, PROT, ALBUMIN in the last 168 hours. No results for input(s): LIPASE, AMYLASE in the last 168 hours. No results for input(s): AMMONIA in the last 168 hours. Coagulation Profile: No results for input(s): INR, PROTIME in the last 168 hours. Cardiac Enzymes: No results for input(s): CKTOTAL, CKMB, CKMBINDEX, TROPONINI in the last 168 hours. BNP (last 3 results) No results for input(s): PROBNP in the last 8760 hours. HbA1C: No results for input(s): HGBA1C in the last 72 hours. CBG: No results for input(s): GLUCAP in the last 168 hours. Lipid Profile: No results for input(s): CHOL, HDL, LDLCALC, TRIG, CHOLHDL, LDLDIRECT in the last 72 hours. Thyroid Function Tests: No results for input(s): TSH, T4TOTAL, FREET4, T3FREE, THYROIDAB in the last 72 hours. Anemia Panel: No results for input(s): VITAMINB12, FOLATE, FERRITIN, TIBC, IRON, RETICCTPCT in the last 72 hours. Urine analysis:    Component Value Date/Time   COLORURINE RED (A) 09/04/2017 1955   APPEARANCEUR TURBID (A) 09/04/2017 1955   LABSPEC  09/04/2017 1955    TEST NOT REPORTED DUE TO COLOR INTERFERENCE OF URINE PIGMENT   PHURINE  09/04/2017 1955    TEST NOT REPORTED DUE TO COLOR INTERFERENCE OF URINE PIGMENT   GLUCOSEU (A) 09/04/2017 1955    TEST NOT REPORTED DUE TO COLOR INTERFERENCE OF URINE PIGMENT   HGBUR (A) 09/04/2017 1955    TEST NOT REPORTED DUE TO COLOR INTERFERENCE OF URINE PIGMENT   BILIRUBINUR (A) 09/04/2017 1955    TEST NOT REPORTED DUE TO COLOR INTERFERENCE OF URINE PIGMENT   KETONESUR (A) 09/04/2017 1955    TEST NOT REPORTED DUE TO COLOR INTERFERENCE OF URINE PIGMENT   PROTEINUR (A) 09/04/2017 1955    TEST NOT REPORTED DUE TO COLOR INTERFERENCE OF URINE PIGMENT   UROBILINOGEN  0.2 08/30/2014 2203   NITRITE (A) 09/04/2017  1955    TEST NOT REPORTED DUE TO COLOR INTERFERENCE OF URINE PIGMENT   LEUKOCYTESUR (A) 09/04/2017 1955    TEST NOT REPORTED DUE TO COLOR INTERFERENCE OF URINE PIGMENT    Radiological Exams on Admission: No results found.  EKG: Independently reviewed.  Sinus rhythm with no acute changes since last EKG   Assessment and Plan:   1.  Symptomatic acute blood loss on chronic anemia: 2 units of PRBC to be transfused slowly in this patient with end-stage renal disease.  Second unit will likely be given with dialysis today.  2.  End-stage renal disease: ED physician has notified nephrology for scheduled dialysis  3.  Multiple sclerosis: Resume home medications.  4.  Pressure ulcers: Patient prefers to resume wound care at home.  Will defer inpatient wound care consult as patient likely can be discharged after blood transfusions in a.m.  DVT prophylaxis: SCD  Code Status: Full code  Family Communication: Discussed with patient.  Consults called: Nephrology Admission status:  Patient admitted as observation as anticipated LOS greater than 2 midnights    Guilford Shi MD [facility] Pager [phone]  If 7PM-7AM, please contact night-coverage [email or web address] Password [email or web address]  03/05/2018, 1:20 PM

## 2018-03-05 NOTE — ED Notes (Signed)
Report given to dialysis RN - will go to [facility]-

## 2018-03-05 NOTE — Progress Notes (Signed)
Patient with ESRD on TTS HD ([facility]) presented to dialysis today inquiring about his hgb - It had been running in the 7s and was 6.4  He then requested transfer pre HD to [facility] and had to leave his electric scooter at his HD unit and worried about this because he needs it to get to dialysis and around his home.  He is very worried about this because it is only mode of transportation.  Just talked to HD RN and they are working on trying to get the chair back home as [facility] refuses to transport it (and they are his usual transportation).    Hgb here was 6.5  His is admitted her to OBS status for transfusion on dialysis.  He declined transfusion last week when hgb was 7.  He has a sacral wound that is followed by wound care and oozes blood.  Last Mircea was 225 10/31. His tsat was lowish in October, but no IV Fe given.  Getting two units today - will need to have Fe studies drawn again after d/c . Currently being transfused on dialysis.  Would expect ferritin to be high given wound status. Full consult to follow if he changes to inpatient status.  Amalia Hailey, PA-C [facility]

## 2018-03-05 NOTE — Progress Notes (Signed)
Post transfusion hemoglobin 9.2, [place], NP notified.

## 2018-03-05 NOTE — Progress Notes (Signed)
Received patient into 5w bed 18. Patient alert and oriented x 4. Placed on telemetry monitor. Patient placed on air bed. Multiple pressure injuries noted on patient. Stage 2 pressure injuries on L lateral foot and R lateral foot. Unstageable pressure injuries noted on patient's buttock. Per patient, he follows up with wound care at home and he doesn't think he needs a WOC consult. Foam dressings placed on injuries since no wound care orders are in.

## 2018-03-05 NOTE — ED Triage Notes (Signed)
To ED via [facility] from [place] with low hgb-- states nephrology has been watching it and today it is 6.4,  Pt lives at home, is wheelchair/bed bound-

## 2018-03-05 NOTE — ED Notes (Signed)
Patient is tolerating blood transfusion well. No complaints. Denies signs of transfusion reaction. Vital signs stable at this time. Primary RN made aware.

## 2018-03-05 NOTE — ED Provider Notes (Signed)
[facility] EMERGENCY DEPARTMENT Provider Note   CSN: [id number] Arrival date & time: 03/05/18  0745     History   Chief Complaint Chief Complaint  Patient presents with  . Anemia  . Fatigue    HPI Lance [name] is a 77 y.o. male.  Patient is a 77 year old male with a history of multiple sclerosis with spastic paraplegia who is wheelchair-bound, end-stage renal disease on dialysis Tuesday Thursday and Saturday, cardiomyopathy and diabetes presenting today from dialysis for worsening anemia.  Patient does take not take any anticoagulants or antiplatelet agents.  He states over the last few weeks his hemoglobin has been trending down.  Normally his hemoglobin is 8-10 but was told today at dialysis it was 6.4.  He did not receive dialysis today because the [facility] lift was broken and when they told him his blood counts he decided that he should come here for transfusion.  He denies any chest pain or shortness of breath but does complain of being weak and fatigued.  He denies any bloody stools or black stools.  He was seen 3 weeks ago for bleeding of his mouth where he had had dental work done but has had no further bleeding since that time.  He has had no abdominal pain, nausea or vomiting.  He has a Foley catheter present and has been putting out approximately 800 mL's of urine daily.  Foley catheter has been changed within the last month  The history is provided by the patient and medical records.  Anemia     Past Medical History:  Diagnosis Date  . Acute CHF (congestive heart failure) ([place]) 02/14/2016  . Acute osteomyelitis, pelvis, right ([place])   . Acute renal failure ([place])   . Anemia in chronic renal disease   . Blood transfusion   . Cardiomyopathy ([place]) 02/17/2016  . Chronic spastic paraplegia    secondary to MS  . Decreased sensation of lower extremity    due to MS  . Decubitus ulcer of ischium   . Decubitus ulcer of trochanter, stage 4 ([place]) 06/14/2015   . ESRD (end stage renal disease) ([place])    [place]  . History of kidney stones    x2  . History of MRSA infection    urine  . History of recurrent UTIs   . Hypertension   . MS (multiple sclerosis) ([place])   . Multiple sclerosis ([place])    stated by client in triage   . Neuralgia neuritis, sciatic nerve    MS for 30 years  . Neurogenic bladder   . PONV (postoperative nausea and vomiting)   . Presence of indwelling urinary catheter   . Self-catheterizes urinary bladder   . Type 2 diabetes mellitus [facility])     Patient Active Problem List   Diagnosis Date Noted  . Goals of care, counseling/discussion   . Palliative care encounter   . ESRD on hemodialysis ([place]) 09/05/2017  . Thrombocytopenia ([place]) 09/05/2017  . Pressure injury of skin 09/04/2017  . Moderate protein-calorie malnutrition ([place]) 03/19/2017  . Sepsis secondary to UTI ([place]) 03/18/2017  . Sepsis ([place]) 03/18/2017  . Partial nontraumatic amputation of foot, left ([place]) 01/27/2017  . Malnutrition of moderate degree 07/03/2016  . Macrocytic anemia 07/02/2016  . SIRS (systemic inflammatory response syndrome) ([place]) 05/09/2016  . Melena 05/09/2016  . Hematemesis 05/09/2016  . Non-intractable vomiting with nausea 05/07/2016  . Altered mental status 04/21/2016  . Acute respiratory failure ([place])   . ESRD  on dialysis [facility])   . NICM (nonischemic cardiomyopathy) ([place])   . Impaired functional mobility, balance, and endurance 02/17/2016  . Generalized anxiety disorder 02/17/2016  . Cardiomyopathy ([place]) 02/17/2016  . Hypoxia   . Acute pulmonary edema (H[facility])   . Elevated troponin 02/14/2016  . Acute systolic heart failure ([place]) 02/14/2016  . Depression 02/13/2016  . DM (diabetes mellitus) type II controlled with renal manifestation ([place]) 09/06/2015  . Dyslipidemia associated with type 2 diabetes mellitus ([place]) 09/06/2015  . Recurrent UTI (urinary tract infection) 09/06/2015  . Decubitus ulcer of right ischial area  06/14/2015  . Decubitus ulcer of trochanter, stage 4 ([place]) 06/14/2015  . Left perineal ischial pressure ulcer   . Acute osteomyelitis, pelvis, right ([place])   . Complicated urinary tract infection   . Essential hypertension, benign 08/03/2013  . Spastic quadriplegia ([place]) 07/10/2011  . Multiple sclerosis ([place]) 03/30/2011  . Sacral decubitus ulcer 03/30/2011  . Progressive anemia 03/30/2011    Past Surgical History:  Procedure Laterality Date  . AMPUTATION Left 01/22/2017   Procedure: Left 5th Ray Amputation;  Surgeon: Newt Minion, MD;  Location: [facility];  Service: Orthopedics;  Laterality: Left;  . APPLICATION OF A-CELL OF EXTREMITY Left 09/05/2014   Procedure: PLACEMENT OF ACELL AND VAC;  Surgeon: Theodoro Kos, DO;  Location: [facility];  Service: Plastics;  Laterality: Left;  . BASCILIC VEIN TRANSPOSITION Right 03/24/2016   Procedure: RADIAL- CEPHALIC FISTULA CREATION RIGHT ARM;  Surgeon: Conrad Hartville, MD;  Location: [facility];  Service: Vascular;  Laterality: Right;  . Buttocks flap Left   . COLONOSCOPY    . EXTRACORPOREAL SHOCK WAVE LITHOTRIPSY  03/23/2011   x2  . FINGER SURGERY  2004  . hip flap Right   . INCISION AND DRAINAGE OF WOUND Left 09/05/2014   Procedure: IRRIGATION AND DEBRIDEMENT OF LEFT ISCHIUM WOUND WITH ;  Surgeon: Theodoro Kos, DO;  Location: [facility];  Service: Plastics;  Laterality: Left;  . INSERTION OF DIALYSIS CATHETER N/A 02/27/2016   Procedure: INSERTION OF DIALYSIS CATHETER-RIGHT INTERNAL JUGULA PLACEMENT;  Surgeon: Conrad Siletz, MD;  Location: [facility];  Service: Vascular;  Laterality: N/A;  . TONSILLECTOMY    . WOUND DEBRIDEMENT     10/2/17[facility]         Home Medications    Prior to Admission medications   Medication Sig Start Date End Date Taking? Authorizing Provider  acetaminophen (TYLENOL) 325 MG tablet Take 2 tablets (650 mg total) by mouth every 6 (six) hours as needed for mild pain (or Fever >/= 101). 09/09/17   Roxan Hockey, MD  baclofen (LIORESAL) 10 MG  tablet Take 1 tablet (10 mg total) by mouth at bedtime as needed for muscle spasms. 07/15/16   Lauree Chandler, NP  carvedilol (COREG) 3.125 MG tablet Take 1 tablet (3.125 mg total) by mouth 2 (two) times daily with a meal. 07/15/16   Eubanks, Carlos American, NP  insulin aspart (NOVOLOG FLEXPEN) 100 UNIT/ML FlexPen Inject 0-9 Units into the skin 3 (three) times daily with meals. Per sliding scale Patient taking differently: Inject 7 Units 3 (three) times daily with meals into the skin. Per sliding scale 07/20/16   Lauree Chandler, NP  insulin detemir (LEVEMIR) 100 UNIT/ML injection Inject 9 Units into the skin daily with breakfast.    [provider]  Insulin Pen Needle (PEN NEEDLES) 30G X 8 MM MISC Inject 1 each into the skin 4 (four) times daily. . 07/20/16   Lauree Chandler, NP  lidocaine-prilocaine (EMLA) cream Apply 1 application topically as needed. Patient taking differently: Apply 1 application topically as needed (for pain).  07/24/16   Conrad Hustler, MD  Melatonin 3 MG TABS Take 3 mg by mouth at bedtime.    [provider]  simvastatin (ZOCOR) 20 MG tablet Take 20 mg by mouth every evening.    [provider]  Skin Protectants, Misc. (EUCERIN) cream Apply 1 application daily as needed topically for dry skin. For feet     [provider]    Family History Family History  Problem Relation Age of Onset  . Stroke Father   . Diabetes Father   . Diabetes Paternal Grandmother   . Stroke Paternal Grandfather   . Heart disease Neg Hx   . Cancer Neg Hx     Social History Social History   Tobacco Use  . Smoking status: Former Smoker    Types: Cigars    Last attempt to quit: 05/06/1983    Years since quitting: 34.8  . Smokeless tobacco: Never Used  Substance Use Topics  . Alcohol use: No    Alcohol/week: 1.0 standard drinks    Types: 1 Glasses of wine per week    Comment: pt has not had any since Dec 2017  . Drug use: No     Allergies     Patient has no known allergies.   Review of Systems Review of Systems  All other systems reviewed and are negative.    Physical Exam Updated Vital Signs BP (!) 147/66 (BP Location: Left Arm)   Pulse 82   Temp (!) 97.3 F (36.3 C) (Oral)   Resp 16   Ht 6\' 4"  (1.93 m)   Wt 74.5 kg Comment: dry weight  SpO2 98%   BMI 19.99 kg/m   Physical Exam  Constitutional: He is oriented to person, place, and time. He appears well-developed and well-nourished. No distress.  HENT:  Head: Normocephalic and atraumatic.  Mouth/Throat: Oropharynx is clear and moist.  Eyes: Pupils are equal, round, and reactive to light. EOM are normal.  Pale conjunctive a  Neck: Normal range of motion. Neck supple.  Cardiovascular: Normal rate, regular rhythm and intact distal pulses.  Murmur heard.  Systolic murmur is present. Systolic flow murmur  Pulmonary/Chest: Effort normal and breath sounds normal. No respiratory distress. He has no wheezes. He has no rales.  Abdominal: Soft. He exhibits no distension. There is no tenderness. There is no rebound and no guarding.  Musculoskeletal: Normal range of motion. He exhibits no edema or tenderness.  Contractures and deformity in bilateral lower extremities.  Feet are both wrapped bilaterally.  1+ pitting edema in bilateral lower extremities  Neurological: He is alert and oriented to person, place, and time.  Skin: Skin is warm and dry. No rash noted. No erythema. There is pallor.  Psychiatric: He has a normal mood and affect. His behavior is normal.  Nursing note and vitals reviewed.    ED Treatments / Results  Labs (all labs ordered are listed, but only abnormal results are displayed) Labs Reviewed  CBC WITH DIFFERENTIAL/PLATELET - Abnormal; Notable for the following components:      Result Value   RBC 2.14 (*)    Hemoglobin 6.5 (*)    HCT 22.6 (*)    MCV 105.6 (*)    MCHC 28.8 (*)    RDW 17.2 (*)    Eosinophils Absolute 0.7 (*)    All other  components within normal limits  BASIC METABOLIC PANEL - Abnormal; Notable for the following components:   Glucose, Bld 142 (*)    BUN 39 (*)    Creatinine, Ser 3.25 (*)    Calcium 8.5 (*)    GFR calc non Af Amer 17 (*)    GFR calc Af Amer 20 (*)    All other components within normal limits  POC OCCULT BLOOD, ED  TYPE AND SCREEN  PREPARE RBC (CROSSMATCH)    EKG EKG Interpretation  Date/Time:  Saturday March 05 2018 08:27:13 EDT Ventricular Rate:  83 PR Interval:    QRS Duration: 95 QT Interval:  402 QTC Calculation: 473 R Axis:   61 Text Interpretation:  Sinus rhythm Ventricular premature complex Probable left ventricular hypertrophy No significant change since last tracing Confirmed by Blanchie Dessert [phone]) on 03/05/2018 8:57:20 AM   Radiology No results found.  Procedures Procedures (including critical care time)  Medications Ordered in ED Medications - No data to display   Initial Impression / Assessment and Plan / ED Course  I have reviewed the triage vital signs and the nursing notes.  Pertinent labs & imaging results that were available during my care of the patient were reviewed by me and considered in my medical decision making (see chart for details).     Patient presenting today due to note complaint of anemia.  He has end-stage renal disease on dialysis and they have been trending his hemoglobin which is been coming down over the last week.  He denies any signs of bleeding over the last week and takes no anticoagulation or antiplatelet agents.  Patient denies any chest pain or shortness of breath.  Today he did not receive dialysis but was almost at his dry weight.  He is complaining of being strongly fatigued and weak.  No prior history of GI bleeding.  9:57 AM Hemoglobin today is 6.5 and transfusion initiated with 2 units.  BMP without acute findings.  Will admit for further care.  CRITICAL CARE Performed by: Elayne Gruver Total critical care  time: 30 minutes Critical care time was exclusive of separately billable procedures and treating other patients. Critical care was necessary to treat or prevent imminent or life-threatening deterioration. Critical care was time spent personally by me on the following activities: development of treatment plan with patient and/or surrogate as well as nursing, discussions with consultants, evaluation of patient's response to treatment, examination of patient, obtaining history from patient or surrogate, ordering and performing treatments and interventions, ordering and review of laboratory studies, ordering and review of radiographic studies, pulse oximetry and re-evaluation of patient's condition.   Final Clinical Impressions(s) / ED Diagnoses   Final diagnoses:  Symptomatic anemia  ESRD (end stage renal disease) [facility])    ED Discharge Orders    None       Blanchie Dessert, MD 03/05/18 1011

## 2018-03-05 NOTE — ED Notes (Signed)
Report given to 5W - 

## 2018-03-06 DIAGNOSIS — D649 Anemia, unspecified: Secondary | ICD-10-CM

## 2018-03-06 DIAGNOSIS — Z992 Dependence on renal dialysis: Secondary | ICD-10-CM | POA: Diagnosis not present

## 2018-03-06 DIAGNOSIS — N186 End stage renal disease: Secondary | ICD-10-CM | POA: Diagnosis not present

## 2018-03-06 DIAGNOSIS — E1122 Type 2 diabetes mellitus with diabetic chronic kidney disease: Secondary | ICD-10-CM | POA: Diagnosis not present

## 2018-03-06 DIAGNOSIS — I132 Hypertensive heart and chronic kidney disease with heart failure and with stage 5 chronic kidney disease, or end stage renal disease: Secondary | ICD-10-CM | POA: Diagnosis not present

## 2018-03-06 DIAGNOSIS — T83410A Breakdown (mechanical) of penile (implanted) prosthesis, initial encounter: Secondary | ICD-10-CM | POA: Diagnosis not present

## 2018-03-06 DIAGNOSIS — Z794 Long term (current) use of insulin: Secondary | ICD-10-CM

## 2018-03-06 LAB — GLUCOSE, CAPILLARY
GLUCOSE-CAPILLARY: 119 mg/dL — AB (ref 70–99)
GLUCOSE-CAPILLARY: 139 mg/dL — AB (ref 70–99)
GLUCOSE-CAPILLARY: 90 mg/dL (ref 70–99)
GLUCOSE-CAPILLARY: 212 mg/dL — AB (ref 70–99)
Glucose-Capillary: 25 mg/dL — CL (ref 70–99)
Glucose-Capillary: 46 mg/dL — ABNORMAL LOW (ref 70–99)
Glucose-Capillary: 69 mg/dL — ABNORMAL LOW (ref 70–99)

## 2018-03-06 LAB — BPAM RBC
BLOOD PRODUCT EXPIRATION DATE: 201911282359
Blood Product Expiration Date: 201911282359
ISSUE DATE / TIME: 201911021047
ISSUE DATE / TIME: 201911021624
UNIT TYPE AND RH: 6200
Unit Type and Rh: 6200

## 2018-03-06 LAB — TYPE AND SCREEN
ABO/RH(D): A POS
ANTIBODY SCREEN: NEGATIVE
UNIT DIVISION: 0
Unit division: 0

## 2018-03-06 LAB — CBC
HEMATOCRIT: 27.7 % — AB (ref 39.0–52.0)
Hemoglobin: 8.2 g/dL — ABNORMAL LOW (ref 13.0–17.0)
MCH: 29.6 pg (ref 26.0–34.0)
MCHC: 29.6 g/dL — ABNORMAL LOW (ref 30.0–36.0)
MCV: 100 fL (ref 80.0–100.0)
NRBC: 0 % (ref 0.0–0.2)
Platelets: 245 10*3/uL (ref 150–400)
RBC: 2.77 MIL/uL — AB (ref 4.22–5.81)
RDW: 16.6 % — AB (ref 11.5–15.5)
WBC: 7.1 10*3/uL (ref 4.0–10.5)

## 2018-03-06 LAB — MRSA PCR SCREENING: MRSA by PCR: POSITIVE — AB

## 2018-03-06 MED ORDER — GLUCOSE 4 G PO CHEW
CHEWABLE_TABLET | ORAL | Status: AC
  Administered 2018-03-06: 17:00:00
  Filled 2018-03-06: qty 2

## 2018-03-06 MED ORDER — MUPIROCIN 2 % EX OINT
1.0000 "application " | TOPICAL_OINTMENT | Freq: Two times a day (BID) | CUTANEOUS | Status: DC
  Administered 2018-03-06 – 2018-03-07 (×3): 1 via NASAL
  Filled 2018-03-06 (×2): qty 22

## 2018-03-06 MED ORDER — CHLORHEXIDINE GLUCONATE CLOTH 2 % EX PADS
6.0000 | MEDICATED_PAD | Freq: Every day | CUTANEOUS | Status: DC
  Administered 2018-03-06 – 2018-03-07 (×2): 6 via TOPICAL

## 2018-03-06 NOTE — Discharge Summary (Addendum)
PATIENT DETAILS Name: Lance [name] Age: 77 y.o. Sex: male Date of Birth: 01/06/1941 MRN: [id number]. Admitting Physician: Lance Shi, MD HUD:Lance [name], Lance Reichmann, MD  Admit Date: 03/05/2018 Discharge date: 03/07/2018  Recommendations for Outpatient Follow-up:  1. Follow up with PCP in 1-2 weeks 2. Please obtain BMP/CBC in one week 3. Please ensure follow-up with urology.  Admitted From:  Home with home health services  Disposition: Home with home health services   [place]: Yes  Equipment/Devices: None  Discharge Condition: Stable  CODE STATUS: FULL CODE  Diet recommendation:  Heart Healthy / Carb Modified   Brief Summary: See H&P, Labs, Consult and Test reports for all details in brief, Patient is a 77 y.o. male with history of ESRD on HD TTS, multiple sclerosis, neurogenic bladder requiring Foley catheter insertion, chronic sacral decubitus ulcers-brought to the hospital for evaluation of symptomatic anemia, found to have a hemoglobin of 6.5.  Patient admitted-transfused 2 units of PRBC.  See below for further details.  Brief Hospital Course: Anemia: Felt to be multifactorial-has anemia related to chronic disease from ESRD/chronic sacral wound-apparently his sacral wounds bruises bloods-therefore could have a component of blood loss as well.  Transfused 2 units of PRBC-hemoglobin stable at 8.2 on discharge.  Implanted penile prosthesis extrusion: On 11/3-foreign body was noted partially extruded from his urethral meatus-urology was consulted-apparently this was a extruded penile implant.  Urology removed both right and left cylinders.  He was observed overnight.  No obvious signs of infection.  This MD spoke with his primary urologist-Dr. Tresa Moore over the phone-okay to discharge-he will ensure follow-up in the outpatient setting-the fact patient has an appointment on 11/8.  Patient does have some minimal oozing around the fistula site where the Foley catheter is  inserted-per Dr. Clista Bernhardt is stable to be monitored in the outpatient setting  ESRD: On HD TTS-nephrology followed closely  Insulin-dependent DM-2: Had episodes of hypoglycemia last evening-we will decrease Levemir to 5 units.  Cautiously continue with NovoLog.  Further optimization deferred to the outpatient setting.   Hypertension: Continue Coreg.  Dyslipidemia: Continue statin  Chronic Foley catheter in place for neurogenic bladder  History of multiple sclerosis-bedbound at baseline with stage IV sacral decubitus ulcers: Resume home health services   Procedures/Studies: None  Discharge Diagnoses:  Active Problems:   Anemia   Discharge Instructions:  Activity:  As tolerated with Full fall precautions use walker/cane & assistance as needed   Discharge Instructions    Diet - low sodium heart healthy   Complete by:  As directed    Diet - low sodium heart healthy   Complete by:  As directed    Diet Carb Modified   Complete by:  As directed    Diet Carb Modified   Complete by:  As directed    Discharge instructions   Complete by:  As directed    Follow with Primary MD  Lavone Orn, MD  In 1 week  Follow with your Dialysis center at your usual schedule  Please get a complete blood count and chemistry panel checked by your Primary MD at your next visit, and again as instructed by your Primary MD.  Get Medicines reviewed and adjusted: Please take all your medications with you for your next visit with your Primary MD  Laboratory/radiological data: Please request your Primary MD to go over all hospital tests and procedure/radiological results at the follow up, please ask your Primary MD to get all Hospital records sent to his/her office.  In  some cases, they will be blood work, cultures and biopsy results pending at the time of your discharge. Please request that your primary care M.D. follows up on these results.  Also Note the following: If you experience  worsening of your admission symptoms, develop shortness of breath, life threatening emergency, suicidal or homicidal thoughts you must seek medical attention immediately by calling 911 or calling your MD immediately  if symptoms less severe.  You must read complete instructions/literature along with all the possible adverse reactions/side effects for all the Medicines you take and that have been prescribed to you. Take any new Medicines after you have completely understood and accpet all the possible adverse reactions/side effects.   Do not drive when taking Pain medications or sleeping medications (Benzodaizepines)  Do not take more than prescribed Pain, Sleep and Anxiety Medications. It is not advisable to combine anxiety,sleep and pain medications without talking with your primary care practitioner  Special Instructions: If you have smoked or chewed Tobacco  in the last 2 yrs please stop smoking, stop any regular Alcohol  and or any Recreational drug use.  Wear Seat belts while driving.  Please note: You were cared for by a hospitalist during your hospital stay. Once you are discharged, your primary care physician will handle any further medical issues. Please note that NO REFILLS for any discharge medications will be authorized once you are discharged, as it is imperative that you return to your primary care physician (or establish a relationship with a primary care physician if you do not have one) for your post hospital discharge needs so that they can reassess your need for medications and monitor your lab values.   Discharge instructions   Complete by:  As directed    Follow with Primary MD  Lavone Orn, MD in 1 week  Follow-up with urology-you have been up appointment coming up on 11/8-please keep that appointment  Follow with your dialysis center at your usual schedule  Please get a complete blood count and chemistry panel checked by your Primary MD at your next visit, and again as  instructed by your Primary MD.  Get Medicines reviewed and adjusted: Please take all your medications with you for your next visit with your Primary MD  Laboratory/radiological data: Please request your Primary MD to go over all hospital tests and procedure/radiological results at the follow up, please ask your Primary MD to get all Hospital records sent to his/her office.  In some cases, they will be blood work, cultures and biopsy results pending at the time of your discharge. Please request that your primary care M.D. follows up on these results.  Also Note the following: If you experience worsening of your admission symptoms, develop shortness of breath, life threatening emergency, suicidal or homicidal thoughts you must seek medical attention immediately by calling 911 or calling your MD immediately  if symptoms less severe.  You must read complete instructions/literature along with all the possible adverse reactions/side effects for all the Medicines you take and that have been prescribed to you. Take any new Medicines after you have completely understood and accpet all the possible adverse reactions/side effects.   Do not drive when taking Pain medications or sleeping medications (Benzodaizepines)  Do not take more than prescribed Pain, Sleep and Anxiety Medications. It is not advisable to combine anxiety,sleep and pain medications without talking with your primary care practitioner  Special Instructions: If you have smoked or chewed Tobacco  in the last 2 yrs please stop  smoking, stop any regular Alcohol  and or any Recreational drug use.  Wear Seat belts while driving.  Please note: You were cared for by a hospitalist during your hospital stay. Once you are discharged, your primary care physician will handle any further medical issues. Please note that NO REFILLS for any discharge medications will be authorized once you are discharged, as it is imperative that you return to your  primary care physician (or establish a relationship with a primary care physician if you do not have one) for your post hospital discharge needs so that they can reassess your need for medications and monitor your lab values.   Increase activity slowly   Complete by:  As directed    Increase activity slowly   Complete by:  As directed      Allergies as of 03/07/2018   No Known Allergies     Medication List    TAKE these medications   acetaminophen 325 MG tablet Commonly known as:  TYLENOL Take 2 tablets (650 mg total) by mouth every 6 (six) hours as needed for mild pain (or Fever >/= 101).   baclofen 10 MG tablet Commonly known as:  LIORESAL Take 1 tablet (10 mg total) by mouth at bedtime as needed for muscle spasms. What changed:  when to take this   carvedilol 3.125 MG tablet Commonly known as:  COREG Take 1 tablet (3.125 mg total) by mouth 2 (two) times daily with a meal.   eucerin cream Apply 1 application daily as needed topically for dry skin. For feet   insulin aspart 100 UNIT/ML FlexPen Commonly known as:  NOVOLOG Inject 0-9 Units into the skin 3 (three) times daily with meals. Per sliding scale What changed:  how much to take   insulin detemir 100 UNIT/ML injection Commonly known as:  LEVEMIR Inject 0.05 mLs (5 Units total) into the skin daily with breakfast. What changed:  how much to take   lidocaine-prilocaine cream Commonly known as:  EMLA Apply 1 application topically as needed. What changed:  reasons to take this   Melatonin 3 MG Tabs Take 3 mg by mouth at bedtime.   midodrine 10 MG tablet Commonly known as:  PROAMATINE Take 10 mg by mouth as needed.   Pen Needles 30G X 8 MM Misc Inject 1 each into the skin 4 (four) times daily. Marland Kitchen   SANTYL ointment Generic drug:  collagenase Apply 1 application topically as needed.   simvastatin 20 MG tablet Commonly known as:  ZOCOR Take 20 mg by mouth every evening.   trimethoprim 100 MG tablet Commonly  known as:  TRIMPEX Take 100 mg by mouth daily.      Follow-up Information    Lavone Orn, MD. Schedule an appointment as soon as possible for a visit in 1 week(s).   Specialty:  Internal Medicine Contact information: [address] [place] [address] [phone]        dialysis center Follow up.   Why:  at your usual schedule       Alexis Frock, MD Follow up on 03/11/2018.   Specialty:  Urology Why:  keep your existing appt Contact information: [place] [address] [phone]          No Known Allergies   Consultations:   nephrology and urology   Other Procedures/Studies: Dg Chest 2 View  Result Date: 02/09/2018 CLINICAL DATA:  Productive cough for the past 2 days. History of CHF. EXAM: CHEST -  2 VIEW COMPARISON:  Chest x-ray dated Sep 04, 2017. FINDINGS: Stable cardiomegaly. Normal mediastinal contours. Normal pulmonary vascularity. New retrocardiac left lower lobe consolidation and small left pleural effusion. No pneumothorax. No acute osseous abnormality. IMPRESSION: Left lower lobe pneumonia and small left pleural effusion. Followup PA and lateral chest X-ray is recommended in 3-4 weeks following trial of antibiotic therapy to ensure resolution. Electronically Signed   By: Titus Dubin M.D.   On: 02/09/2018 16:08     TODAY-DAY OF DISCHARGE:  Subjective:   Lance [name] today has no headache,no chest abdominal pain,no new weakness tingling or numbness, feels much better wants to go home today.   Objective:   Blood pressure 136/75, pulse 87, temperature 98.2 F (36.8 C), temperature source Oral, resp. rate 17, height 6\' 4"  (1.93 m), weight 74.5 kg, SpO2 95 %.  Intake/Output Summary (Last 24 hours) at 03/07/2018 0851 Last data filed at 03/07/2018 0823 Gross per 24 hour  Intake 60 ml  Output 900 ml  Net -840 ml   Filed Weights   03/05/18 0821 03/05/18 1829  Weight: 74.5 kg 74.5 kg    Exam: Awake Alert,  Oriented *3, No new F.N deficits, Normal affect .AT,PERRAL Supple Neck,No JVD, No cervical lymphadenopathy appriciated.  Symmetrical Chest wall movement, Good air movement bilaterally, CTAB RRR,No Gallops,Rubs or new Murmurs, No Parasternal Heave +ve B.Sounds, Abd Soft, Non tender, No organomegaly appriciated, No rebound -guarding or rigidity. No Cyanosis, Clubbing or edema, No new Rash or bruise   PERTINENT RADIOLOGIC STUDIES: Dg Chest 2 View  Result Date: 02/09/2018 CLINICAL DATA:  Productive cough for the past 2 days. History of CHF. EXAM: CHEST - 2 VIEW COMPARISON:  Chest x-ray dated Sep 04, 2017. FINDINGS: Stable cardiomegaly. Normal mediastinal contours. Normal pulmonary vascularity. New retrocardiac left lower lobe consolidation and small left pleural effusion. No pneumothorax. No acute osseous abnormality. IMPRESSION: Left lower lobe pneumonia and small left pleural effusion. Followup PA and lateral chest X-ray is recommended in 3-4 weeks following trial of antibiotic therapy to ensure resolution. Electronically Signed   By: Titus Dubin M.D.   On: 02/09/2018 16:08     PERTINENT LAB RESULTS: CBC: Recent Labs    03/05/18 0838 03/05/18 2014 03/06/18 0406  WBC 8.3  --  7.1  HGB 6.5* 9.2* 8.2*  HCT 22.6* 30.1* 27.7*  PLT 234  --  245   CMET CMP     Component Value Date/Time   NA 134 (L) 03/05/2018 1518   NA 143 04/20/2016   K 3.3 (L) 03/05/2018 1518   CL 99 03/05/2018 1518   CO2 29 03/05/2018 1518   GLUCOSE 133 (H) 03/05/2018 1518   BUN 12 03/05/2018 1518   BUN 62 (A) 04/20/2016   CREATININE 1.44 (H) 03/05/2018 1518   CALCIUM 7.8 (L) 03/05/2018 1518   CALCIUM 9.0 03/23/2016 0624   PROT 8.5 (H) 09/04/2017 1913   ALBUMIN 2.5 (L) 09/07/2017 1334   AST 14 (L) 09/04/2017 1913   ALT 9 (L) 09/04/2017 1913   ALKPHOS 69 09/04/2017 1913   BILITOT 0.8 09/04/2017 1913   GFRNONAA 46 (L) 03/05/2018 1518   GFRAA 53 (L) 03/05/2018 1518    GFR Estimated Creatinine  Clearance: 46 mL/min (A) (by C-G formula based on SCr of 1.44 mg/dL (H)). No results for input(s): LIPASE, AMYLASE in the last 72 hours. No results for input(s): CKTOTAL, CKMB, CKMBINDEX, TROPONINI in the last 72 hours. Invalid input(s): POCBNP No results for input(s): DDIMER in the last 72 hours.  No results for input(s): HGBA1C in the last 72 hours. No results for input(s): CHOL, HDL, LDLCALC, TRIG, CHOLHDL, LDLDIRECT in the last 72 hours. No results for input(s): TSH, T4TOTAL, T3FREE, THYROIDAB in the last 72 hours.  Invalid input(s): FREET3 No results for input(s): VITAMINB12, FOLATE, FERRITIN, TIBC, IRON, RETICCTPCT in the last 72 hours. Coags: No results for input(s): INR in the last 72 hours.  Invalid input(s): PT Microbiology: Recent Results (from the past 240 hour(s))  MRSA PCR Screening     Status: Abnormal   Collection Time: 03/05/18 10:20 PM  Result Value Ref Range Status   MRSA by PCR POSITIVE (A) NEGATIVE Final    Comment:        The GeneXpert MRSA Assay (FDA approved for NASAL specimens only), is one component of a comprehensive MRSA colonization surveillance program. It is not intended to diagnose MRSA infection nor to guide or monitor treatment for MRSA infections. RESULT CALLED TO, READ BACK BY AND VERIFIED WITH: MASLENJAK,I RN 03/06/18 AT 0025 SKEEN,P Performed at [facility], [place] [address], [place], [place] [address]     FURTHER DISCHARGE INSTRUCTIONS:  Get Medicines reviewed and adjusted: Please take all your medications with you for your next visit with your Primary MD  Laboratory/radiological data: Please request your Primary MD to go over all hospital tests and procedure/radiological results at the follow up, please ask your Primary MD to get all Hospital records sent to his/her office.  In some cases, they will be blood work, cultures and biopsy results pending at the time of your discharge. Please request that your primary care M.D. goes  through all the records of your hospital data and follows up on these results.  Also Note the following: If you experience worsening of your admission symptoms, develop shortness of breath, life threatening emergency, suicidal or homicidal thoughts you must seek medical attention immediately by calling 911 or calling your MD immediately  if symptoms less severe.  You must read complete instructions/literature along with all the possible adverse reactions/side effects for all the Medicines you take and that have been prescribed to you. Take any new Medicines after you have completely understood and accpet all the possible adverse reactions/side effects.   Do not drive when taking Pain medications or sleeping medications (Benzodaizepines)  Do not take more than prescribed Pain, Sleep and Anxiety Medications. It is not advisable to combine anxiety,sleep and pain medications without talking with your primary care practitioner  Special Instructions: If you have smoked or chewed Tobacco  in the last 2 yrs please stop smoking, stop any regular Alcohol  and or any Recreational drug use.  Wear Seat belts while driving.  Please note: You were cared for by a hospitalist during your hospital stay. Once you are discharged, your primary care physician will handle any further medical issues. Please note that NO REFILLS for any discharge medications will be authorized once you are discharged, as it is imperative that you return to your primary care physician (or establish a relationship with a primary care physician if you do not have one) for your post hospital discharge needs so that they can reassess your need for medications and monitor your lab values.  Total Time spent coordinating discharge including counseling, education and face to face time equals35 minutes.  SignedOren Binet 03/07/2018 8:51 AM

## 2018-03-06 NOTE — Care Management Note (Signed)
Case Management Note  Patient Details  Name: DRAGON THRUSH MRN: [id number] Date of Birth: 08-09-1940  Subjective/Objective:                    Action/Plan:  Spoke to patient at bedside. He states he is active w [facility] Notified [place] of hospitalization. He is bed bound, goes to [facility] wound care center. Denies need for any additional DME.  Lives at home with his wife and son. Will need [facility] transport home. [facility] forms on front of chart.  Bedside RN- Please call [facility] at [phone] opt1,3 when ready to DC patient.     Expected Discharge Date:  03/06/18               Expected Discharge Plan:     In-House Referral:     Discharge planning Services  CM Consult  Post Acute Care Choice:  Home Health Choice offered to:  Patient  DME Arranged:    DME Agency:     HH Arranged:  RN, PT, Nurse's Aide [place] Agency:  [facility]  Status of Service:  In process, will continue to follow  If discussed at Long Length of Stay Meetings, dates discussed:    Additional Comments:  Carles Collet, RN 03/06/2018, 8:49 AM

## 2018-03-06 NOTE — Progress Notes (Signed)
On initial assessment pt noted to have foreign body protruding out of his urethral meatus. Pt stated that is normal appearance of his chronic foley catheter. He also stated that his brown, purulent, serosanguinous drainage is constantly present and he cleans it frequently at home. When asked to look at his catheter again this am pt stated he does not know what is part that was protruding out of his meatus.  Penis and scrotum warm to touch, color appropriate for ethnicity. No c/o pain and discomfort. Day shift RN aware, and will consult MD prior to planed discharge today.

## 2018-03-06 NOTE — Progress Notes (Signed)
Patient with ESRD, Multiple sclerosis  Neurogenic  bladder  on TTS HD ([facility]) presented to OP  dialysis Sat  03/05/18  inquiring about his hgb - It had been running in the 7s and was 6.4 . He requested Transfer to [facility]  For  PRBCS transfusion on HD at [facility] 2/2 weekend schedule and volume load of PRBCs   Hgb 6.5 at [facility]   , He was admitted to observation status  for transfusion on dialysis . He tolerated HD yesterday with 2 units  PRBCs Given on HD .  Hgb 8.2 this am .   His Anemia is multifactorial;l with ESRD Anemia and related to Chronic wounds- sacral  . Noted last Mircea was 225 10/31. His tsat was lowish in October, but no IV Fe given.  Will need to have Fe studies drawn again after d/c .Marland Kitchen  Would expect ferritin to be high given wound status.   Today  Has no cos.noted reported Penile implant partially expelled and sticking out from urethral  meatus  With admit team consulting Urology .Full consult to follow if he changes to inpatient status.  Ernest Haber, PA-C [facility]

## 2018-03-06 NOTE — Progress Notes (Signed)
Hypoglycemic Event  CBG: 29  Treatment: 3 glucose tabs; 240 mLs juice; 2 packets of peanut butter & crackers  Symptoms: Pale, Sweaty, Shaky and Nervous/irritable  Follow-up CBG: Time: 1740 CBG Result: 46  Follow-up CBG: Time:  1759   CBG Result: 69  Follow-up CBG: Time: 1823  CBG Result: 90  Possible Reasons for Event: Inadequate meal intake   Hiram Comber, BSN, RN

## 2018-03-06 NOTE — Progress Notes (Signed)
PROGRESS NOTE        PATIENT DETAILS Name: Lance [name] Age: 77 y.o. Sex: male Date of Birth: [birth year] Admit Date: 03/05/2018 Admitting Physician Guilford Shi, MD FWY:[id number], [name], MD  Brief Narrative: Patient is a 77 y.o. male with history of ESRD on HD TTS, multiple sclerosis, neurogenic bladder requiring Foley catheter insertion, chronic sacral decubitus ulcers-brought to the hospital for evaluation of symptomatic anemia, found to have a hemoglobin of 6.5.  Patient admitted-transfused 2 units of PRBC.  See below for further details.  Subjective: Lying comfortably in bed-Per RN-appears to have a penile implant partially expelled and sticking out from his urethral meatus.  Assessment/Plan: Anemia: Felt to be multifactorial-has anemia related to chronic disease from ESRD/chronic sacral wound-apparently his sacral wounds bruises bloods-therefore could have a component of blood loss as well.  Transfused 2 units of PRBC-hemoglobin stable at 8.2.  Partially expelled penile implant: Urology consulted-await further recommendations.  ESRD: On HD TTS-nephrology following.  Insulin-dependent DM-2: CBG stable-continue Levemir and SSI.  Hypertension: Continue Coreg.  Dyslipidemia: Continue statin  Chronic Foley catheter in place for neurogenic bladder  History of multiple sclerosis-bedbound at baseline  DVT Prophylaxis: SCD's  Code Status: Full code   Family Communication: None at bedside  Disposition Plan: Remain inpatient-home after urology evaluation complete.  Antimicrobial agents: Anti-infectives (From admission, onward)   None      Procedures: None  CONSULTS: Nephrology, urology  Time spent: 25 minutes-Greater than 50% of this time was spent in counseling, explanation of diagnosis, planning of further management, and coordination of care.  MEDICATIONS: Scheduled Meds: . sodium chloride   Intravenous Once  . carvedilol   3.125 mg Oral BID WC  . Chlorhexidine Gluconate Cloth  6 each Topical Q0600  . Chlorhexidine Gluconate Cloth  6 each Topical Q0600  . doxercalciferol  1 mcg Intravenous Q T,Th,Sa-HD  . insulin aspart  7 Units Subcutaneous TID WC  . insulin detemir  9 Units Subcutaneous Q breakfast  . Melatonin  3 mg Oral QHS  . mupirocin ointment  1 application Nasal BID  . simvastatin  20 mg Oral QPM   Continuous Infusions: . sodium chloride    . sodium chloride     PRN Meds:.sodium chloride, sodium chloride, acetaminophen, lidocaine (PF), lidocaine-prilocaine, pentafluoroprop-tetrafluoroeth, polyethylene glycol   PHYSICAL EXAM: Vital signs: Vitals:   03/05/18 1705 03/05/18 1829 03/05/18 2131 03/06/18 0447  BP: (!) 144/69 135/66 (!) 141/61 139/62  Pulse: 81 85 84 84  Resp: 20 18 16  (!) 23  Temp: 97.6 F (36.4 C) 98.1 F (36.7 C) 98.4 F (36.9 C) 98.3 F (36.8 C)  TempSrc: Oral Oral Oral Oral  SpO2: 100% 98% 97% 96%  Weight:  74.5 kg    Height:  6\' 4"  (1.93 m)     Filed Weights   03/05/18 0821 03/05/18 1829  Weight: 74.5 kg 74.5 kg   Body mass index is 19.99 kg/m.   General appearance :Awake, alert, not in any distress.  Chronically sick appearing Eyes:Pink conjunctiva HEENT: Atraumatic and Normocephalic Neck: supple Resp:Good air entry bilaterally, no added sounds  CVS: S1 S2 regular, no murmurs.  GI: Bowel sounds present, Non tender and not distended with no gaurding, rigidity or rebound. Extremities: B/L Lower Ext shows no edema, both legs are warm to touch   I have personally reviewed following labs and imaging studies  LABORATORY DATA: CBC: Recent Labs  Lab 03/05/18 0838 03/05/18 2014 03/06/18 0406  WBC 8.3  --  7.1  NEUTROABS 5.5  --   --   HGB 6.5* 9.2* 8.2*  HCT 22.6* 30.1* 27.7*  MCV 105.6*  --  100.0  PLT 234  --  502    Basic Metabolic Panel: Recent Labs  Lab 03/05/18 0838 03/05/18 1518  NA 138 134*  K 4.0 3.3*  CL 103 99  CO2 27 29  GLUCOSE 142*  133*  BUN 39* 12  CREATININE 3.25* 1.44*  CALCIUM 8.5* 7.8*    GFR: Estimated Creatinine Clearance: 46 mL/min (A) (by C-G formula based on SCr of 1.44 mg/dL (H)).  Liver Function Tests: No results for input(s): AST, ALT, ALKPHOS, BILITOT, PROT, ALBUMIN in the last 168 hours. No results for input(s): LIPASE, AMYLASE in the last 168 hours. No results for input(s): AMMONIA in the last 168 hours.  Coagulation Profile: No results for input(s): INR, PROTIME in the last 168 hours.  Cardiac Enzymes: No results for input(s): CKTOTAL, CKMB, CKMBINDEX, TROPONINI in the last 168 hours.  BNP (last 3 results) No results for input(s): PROBNP in the last 8760 hours.  HbA1C: No results for input(s): HGBA1C in the last 72 hours.  CBG: Recent Labs  Lab 03/05/18 2139 03/06/18 0854  GLUCAP 144* 119*    Lipid Profile: No results for input(s): CHOL, HDL, LDLCALC, TRIG, CHOLHDL, LDLDIRECT in the last 72 hours.  Thyroid Function Tests: No results for input(s): TSH, T4TOTAL, FREET4, T3FREE, THYROIDAB in the last 72 hours.  Anemia Panel: No results for input(s): VITAMINB12, FOLATE, FERRITIN, TIBC, IRON, RETICCTPCT in the last 72 hours.  Urine analysis:    Component Value Date/Time   COLORURINE RED (A) 09/04/2017 1955   APPEARANCEUR TURBID (A) 09/04/2017 1955   LABSPEC  09/04/2017 1955    TEST NOT REPORTED DUE TO COLOR INTERFERENCE OF URINE PIGMENT   PHURINE  09/04/2017 1955    TEST NOT REPORTED DUE TO COLOR INTERFERENCE OF URINE PIGMENT   GLUCOSEU (A) 09/04/2017 1955    TEST NOT REPORTED DUE TO COLOR INTERFERENCE OF URINE PIGMENT   HGBUR (A) 09/04/2017 1955    TEST NOT REPORTED DUE TO COLOR INTERFERENCE OF URINE PIGMENT   BILIRUBINUR (A) 09/04/2017 1955    TEST NOT REPORTED DUE TO COLOR INTERFERENCE OF URINE PIGMENT   KETONESUR (A) 09/04/2017 1955    TEST NOT REPORTED DUE TO COLOR INTERFERENCE OF URINE PIGMENT   PROTEINUR (A) 09/04/2017 1955    TEST NOT REPORTED DUE TO COLOR  INTERFERENCE OF URINE PIGMENT   UROBILINOGEN 0.2 08/30/2014 2203   NITRITE (A) 09/04/2017 1955    TEST NOT REPORTED DUE TO COLOR INTERFERENCE OF URINE PIGMENT   LEUKOCYTESUR (A) 09/04/2017 1955    TEST NOT REPORTED DUE TO COLOR INTERFERENCE OF URINE PIGMENT    Sepsis Labs: Lactic Acid, Venous    Component Value Date/Time   LATICACIDVEN 1.13 09/04/2017 2144    MICROBIOLOGY: Recent Results (from the past 240 hour(s))  MRSA PCR Screening     Status: Abnormal   Collection Time: 03/05/18 10:20 PM  Result Value Ref Range Status   MRSA by PCR POSITIVE (A) NEGATIVE Final    Comment:        The GeneXpert MRSA Assay (FDA approved for NASAL specimens only), is one component of a comprehensive MRSA colonization surveillance program. It is not intended to diagnose MRSA infection nor to guide or monitor treatment for MRSA infections. RESULT  CALLED TO, READ BACK BY AND VERIFIED WITH: MASLENJAK,I RN 03/06/18 AT 0025 SKEEN,P Performed at [facility], [place] [address] [place], [place] [address]     RADIOLOGY STUDIES/RESULTS: Dg Chest 2 View  Result Date: 02/09/2018 CLINICAL DATA:  Productive cough for the past 2 days. History of CHF. EXAM: CHEST - 2 VIEW COMPARISON:  Chest x-ray dated Sep 04, 2017. FINDINGS: Stable cardiomegaly. Normal mediastinal contours. Normal pulmonary vascularity. New retrocardiac left lower lobe consolidation and small left pleural effusion. No pneumothorax. No acute osseous abnormality. IMPRESSION: Left lower lobe pneumonia and small left pleural effusion. Followup PA and lateral chest X-ray is recommended in 3-4 weeks following trial of antibiotic therapy to ensure resolution. Electronically Signed   By: Titus Dubin M.D.   On: 02/09/2018 16:08     LOS: 0 days   Oren Binet, MD  [facility]  If 7PM-7AM, please contact night-coverage  Please page via [email or web address] [email or web address]-click on MD name and type text message  03/06/2018, 10:47  AM

## 2018-03-06 NOTE — Progress Notes (Signed)
Plans were to discharge home earlier this morning-however informed by RN that patient has a foreign body sticking out of his urethral meatus.  I did examine the patient-he appears to have partially expelled his penile prosthesis.  I have reached out to urology-recommendations are to hold discharge as this may need to be surgically extracted.  I will await more recommendations from urology-discharge plans currently on hold.    Full note to follow.

## 2018-03-06 NOTE — Consult Note (Signed)
Consultation: IPP erosion Requested by: Dr. Reyes Ivan  History of Present Illness: Mr. Letourneau is a 77 year old white male who follows with Dr. Tresa Moore for neurogenic bladder and chronic Foley.  Foley catheter is actually Now through a fistula at the penoscrotal junction. Patient has end-stage renal disease on hemodialysis.  He was in hospital for blood transfusion and nurse noted a foreign body protruding through the glans penis.  On exam this is the patient's one piece penile prosthesis which has a small pump in the distal tip that was used to inflate and deflate the prosthesis.  On further exam the right cylinder was at least a fourth of the way extruded and the left cylinder was also visibly eroded through the glans penis. Pt feels well. No fever and normal WBC count.   Past Medical History:  Diagnosis Date  . Acute CHF (congestive heart failure) ([place]) 02/14/2016  . Acute osteomyelitis, pelvis, right ([place])   . Acute renal failure ([place])   . Anemia in chronic renal disease   . Blood transfusion   . Cardiomyopathy ([place]) 02/17/2016  . Chronic spastic paraplegia    secondary to MS  . Decreased sensation of lower extremity    due to MS  . Decubitus ulcer of ischium   . Decubitus ulcer of trochanter, stage 4 ([place]) 06/14/2015  . ESRD (end stage renal disease) ([place])    [place]  . History of kidney stones    x2  . History of MRSA infection    urine  . History of recurrent UTIs   . Hypertension   . MS (multiple sclerosis) ([place])   . Multiple sclerosis ([place])    stated by client in triage   . Neuralgia neuritis, sciatic nerve    MS for 30 years  . Neurogenic bladder   . PONV (postoperative nausea and vomiting)   . Presence of indwelling urinary catheter   . Self-catheterizes urinary bladder   . Type 2 diabetes mellitus ([place])    Past Surgical History:  Procedure Laterality Date  . AMPUTATION Left 01/22/2017   Procedure: Left 5th Ray Amputation;  Surgeon: Newt Minion, MD;  Location: [facility];  Service: Orthopedics;  Laterality: Left;  . APPLICATION OF A-CELL OF EXTREMITY Left 09/05/2014   Procedure: PLACEMENT OF ACELL AND VAC;  Surgeon: Theodoro Kos, DO;  Location: [facility];  Service: Plastics;  Laterality: Left;  . BASCILIC VEIN TRANSPOSITION Right 03/24/2016   Procedure: RADIAL- CEPHALIC FISTULA CREATION RIGHT ARM;  Surgeon: Conrad Larson, MD;  Location: [facility];  Service: Vascular;  Laterality: Right;  . Buttocks flap Left   . COLONOSCOPY    . EXTRACORPOREAL SHOCK WAVE LITHOTRIPSY  03/23/2011   x2  . FINGER SURGERY  2004  . hip flap Right   . INCISION AND DRAINAGE OF WOUND Left 09/05/2014   Procedure: IRRIGATION AND DEBRIDEMENT OF LEFT ISCHIUM WOUND WITH ;  Surgeon: Theodoro Kos, DO;  Location: [facility];  Service: Plastics;  Laterality: Left;  . INSERTION OF DIALYSIS CATHETER N/A 02/27/2016   Procedure: INSERTION OF DIALYSIS CATHETER-RIGHT INTERNAL JUGULA PLACEMENT;  Surgeon: Conrad [place], MD;  Location: [facility];  Service: Vascular;  Laterality: N/A;  . TONSILLECTOMY    . WOUND DEBRIDEMENT     10/2/17[facility]     Home Medications:  Medications Prior to Admission  Medication Sig Dispense Refill Last Dose  . acetaminophen (TYLENOL) 325 MG tablet Take 2 tablets (650 mg total) by mouth every 6 (six) hours  as needed for mild pain (or Fever >/= 101). 10 tablet 0 Past Week at prn  . baclofen (LIORESAL) 10 MG tablet Take 1 tablet (10 mg total) by mouth at bedtime as needed for muscle spasms. (Patient taking differently: Take 10 mg by mouth daily. ) 30 each 0 03/05/2018 at Unknown time  . carvedilol (COREG) 3.125 MG tablet Take 1 tablet (3.125 mg total) by mouth 2 (two) times daily with a meal. 60 tablet 0 03/05/2018 at 0530  . insulin aspart (NOVOLOG FLEXPEN) 100 UNIT/ML FlexPen Inject 0-9 Units into the skin 3 (three) times daily with meals. Per sliding scale (Patient taking differently: Inject 7 Units 3 (three) times daily with meals into the skin. Per sliding scale) 15 mL 0  03/05/2018 at Unknown time  . insulin detemir (LEVEMIR) 100 UNIT/ML injection Inject 9 Units into the skin daily with breakfast.   03/05/2018 at Unknown time  . lidocaine-prilocaine (EMLA) cream Apply 1 application topically as needed. (Patient taking differently: Apply 1 application topically as needed (for pain). ) 30 g 1 03/05/2018 at Unknown time  . Melatonin 3 MG TABS Take 3 mg by mouth at bedtime.   03/04/2018 at Unknown time  . midodrine (PROAMATINE) 10 MG tablet Take 10 mg by mouth as needed.  5 03/04/2018 at Unknown time  . SANTYL ointment Apply 1 application topically as needed.  0 02/19/2018  . simvastatin (ZOCOR) 20 MG tablet Take 20 mg by mouth every evening.   03/04/2018 at Unknown time  . Skin Protectants, Misc. (EUCERIN) cream Apply 1 application daily as needed topically for dry skin. For feet    Past Week at prn  . trimethoprim (TRIMPEX) 100 MG tablet Take 100 mg by mouth daily.  1 03/05/2018 at Unknown time  . Insulin Pen Needle (PEN NEEDLES) 30G X 8 MM MISC Inject 1 each into the skin 4 (four) times daily. . 100 each 0 03/17/2017 at Unknown time   Allergies: No Known Allergies  Family History  Problem Relation Age of Onset  . Stroke Father   . Diabetes Father   . Diabetes Paternal Grandmother   . Stroke Paternal Grandfather   . Heart disease Neg Hx   . Cancer Neg Hx    Social History:  reports that he quit smoking about 34 years ago. His smoking use included cigars. He has never used smokeless tobacco. He reports that he does not drink alcohol or use drugs.  ROS: A complete review of systems was performed.  All systems are negative except for pertinent findings as noted. Review of Systems  All other systems reviewed and are negative.    Physical Exam:  Vital signs in last 24 hours: Temp:  [97.5 F (36.4 C)-98.4 F (36.9 C)] 98.3 F (36.8 C) (11/03 0447) Pulse Rate:  [78-91] 91 (11/03 1100) Resp:  [16-23] 23 (11/03 0447) BP: (130-144)/(54-69) 141/68 (11/03  1100) SpO2:  [96 %-100 %] 96 % (11/03 0447) Weight:  [74.5 kg] 74.5 kg (11/02 1829) General:  Alert and oriented, No acute distress CV: Regular rate and rhythm Lungs: Regular rate and effort Abdomen: Soft, nontender, nondistended, no abdominal masses Back: No CVA tenderness Extremities: No edema Neurologic: no focal deficits, pt in bed GU: Traumatic hypospadias of the glans is present and the right cylinder is at least the third almost half of the way hanging out of the glans penis and the left cylinders also visible (hypospadias not visible in photo).  The catheter is actually through the penoscrotal junction/fistula  into the bladder (not through the penis).  Urine clear.    I spoke to the patient about surgery to the remove the IPP and that he has lost the ability to have an erection.  He said he understood and that he has not been sexually active.  As I was examining the right cylinder it simply slid out.  I showed it to the patient.  With the right cylinder out of the way the left cylinder protruded out and it simply slid out.  Photo of the cylinders prior to removal and after removal were uploaded to the patient's chart. The wound was clean and widely patent given the traumatic hypospadias and the cylinder erosion bilaterally.  Sterile gauze were placed to leave the wound open to drain.   Laboratory Data:  Results for orders placed or performed during the hospital encounter of 03/05/18 (from the past 24 hour(s))  Hemoglobin and hematocrit, blood     Status: Abnormal   Collection Time: 03/05/18  8:14 PM  Result Value Ref Range   Hemoglobin 9.2 (L) 13.0 - 17.0 g/dL   HCT 30.1 (L) 39.0 - 52.0 %  Glucose, capillary     Status: Abnormal   Collection Time: 03/05/18  9:39 PM  Result Value Ref Range   Glucose-Capillary 144 (H) 70 - 99 mg/dL  MRSA PCR Screening     Status: Abnormal   Collection Time: 03/05/18 10:20 PM  Result Value Ref Range   MRSA by PCR POSITIVE (A) NEGATIVE  CBC      Status: Abnormal   Collection Time: 03/06/18  4:06 AM  Result Value Ref Range   WBC 7.1 4.0 - 10.5 K/uL   RBC 2.77 (L) 4.22 - 5.81 MIL/uL   Hemoglobin 8.2 (L) 13.0 - 17.0 g/dL   HCT 27.7 (L) 39.0 - 52.0 %   MCV 100.0 80.0 - 100.0 fL   MCH 29.6 26.0 - 34.0 pg   MCHC 29.6 (L) 30.0 - 36.0 g/dL   RDW 16.6 (H) 11.5 - 15.5 %   Platelets 245 150 - 400 K/uL   nRBC 0.0 0.0 - 0.2 %  Glucose, capillary     Status: Abnormal   Collection Time: 03/06/18  8:54 AM  Result Value Ref Range   Glucose-Capillary 119 (H) 70 - 99 mg/dL  Glucose, capillary     Status: Abnormal   Collection Time: 03/06/18 12:28 PM  Result Value Ref Range   Glucose-Capillary 139 (H) 70 - 99 mg/dL   Recent Results (from the past 240 hour(s))  MRSA PCR Screening     Status: Abnormal   Collection Time: 03/05/18 10:20 PM  Result Value Ref Range Status   MRSA by PCR POSITIVE (A) NEGATIVE Final    Comment:        The GeneXpert MRSA Assay (FDA approved for NASAL specimens only), is one component of a comprehensive MRSA colonization surveillance program. It is not intended to diagnose MRSA infection nor to guide or monitor treatment for MRSA infections. RESULT CALLED TO, READ BACK BY AND VERIFIED WITH: MASLENJAK,I RN 03/06/18 AT 0025 SKEEN,P Performed at [facility], [place] [address] [place], [place] [address]    Creatinine: Recent Labs    03/05/18 0272 03/05/18 1518  CREATININE 3.25* 1.44*    Impression/Assessment/plan:  #1  IPP erosion -because there was no tubing the body was simply extruding the cylinders and this is likely been going on for some time.  The wound is well formed and open and draining.  Will monitor.  #2 neurogenic bladder-patient had some questions about transitioning to a suprapubic tube.  We went over the nature risk benefits and alternatives to a suprapubic tube and I told him to take that up with Dr. Tresa Moore.   I will notify Dr. Tresa Moore of admission.  Festus Aloe 03/06/2018,  4:06 PM

## 2018-03-07 ENCOUNTER — Encounter (HOSPITAL_BASED_OUTPATIENT_CLINIC_OR_DEPARTMENT_OTHER): Payer: Medicare Other | Attending: Internal Medicine

## 2018-03-07 ENCOUNTER — Encounter (HOSPITAL_COMMUNITY): Payer: Self-pay

## 2018-03-07 DIAGNOSIS — M2550 Pain in unspecified joint: Secondary | ICD-10-CM | POA: Diagnosis not present

## 2018-03-07 DIAGNOSIS — G8220 Paraplegia, unspecified: Secondary | ICD-10-CM | POA: Insufficient documentation

## 2018-03-07 DIAGNOSIS — Z7401 Bed confinement status: Secondary | ICD-10-CM | POA: Diagnosis not present

## 2018-03-07 DIAGNOSIS — L97512 Non-pressure chronic ulcer of other part of right foot with fat layer exposed: Secondary | ICD-10-CM | POA: Insufficient documentation

## 2018-03-07 DIAGNOSIS — Z992 Dependence on renal dialysis: Secondary | ICD-10-CM | POA: Diagnosis not present

## 2018-03-07 DIAGNOSIS — L97522 Non-pressure chronic ulcer of other part of left foot with fat layer exposed: Secondary | ICD-10-CM | POA: Insufficient documentation

## 2018-03-07 DIAGNOSIS — I132 Hypertensive heart and chronic kidney disease with heart failure and with stage 5 chronic kidney disease, or end stage renal disease: Secondary | ICD-10-CM | POA: Diagnosis not present

## 2018-03-07 DIAGNOSIS — Z794 Long term (current) use of insulin: Secondary | ICD-10-CM | POA: Insufficient documentation

## 2018-03-07 DIAGNOSIS — L89892 Pressure ulcer of other site, stage 2: Secondary | ICD-10-CM | POA: Insufficient documentation

## 2018-03-07 DIAGNOSIS — G35 Multiple sclerosis: Secondary | ICD-10-CM | POA: Insufficient documentation

## 2018-03-07 DIAGNOSIS — L89150 Pressure ulcer of sacral region, unstageable: Secondary | ICD-10-CM | POA: Insufficient documentation

## 2018-03-07 DIAGNOSIS — I1310 Hypertensive heart and chronic kidney disease without heart failure, with stage 1 through stage 4 chronic kidney disease, or unspecified chronic kidney disease: Secondary | ICD-10-CM | POA: Insufficient documentation

## 2018-03-07 DIAGNOSIS — E1122 Type 2 diabetes mellitus with diabetic chronic kidney disease: Secondary | ICD-10-CM | POA: Diagnosis not present

## 2018-03-07 DIAGNOSIS — E11621 Type 2 diabetes mellitus with foot ulcer: Secondary | ICD-10-CM | POA: Insufficient documentation

## 2018-03-07 DIAGNOSIS — D649 Anemia, unspecified: Secondary | ICD-10-CM | POA: Diagnosis not present

## 2018-03-07 DIAGNOSIS — L89324 Pressure ulcer of left buttock, stage 4: Secondary | ICD-10-CM | POA: Insufficient documentation

## 2018-03-07 DIAGNOSIS — N186 End stage renal disease: Secondary | ICD-10-CM | POA: Diagnosis not present

## 2018-03-07 DIAGNOSIS — N183 Chronic kidney disease, stage 3 (moderate): Secondary | ICD-10-CM | POA: Insufficient documentation

## 2018-03-07 DIAGNOSIS — L89314 Pressure ulcer of right buttock, stage 4: Secondary | ICD-10-CM | POA: Insufficient documentation

## 2018-03-07 LAB — GLUCOSE, CAPILLARY
GLUCOSE-CAPILLARY: 149 mg/dL — AB (ref 70–99)
Glucose-Capillary: 91 mg/dL (ref 70–99)
Glucose-Capillary: 29 mg/dL — CL (ref 70–99)

## 2018-03-07 MED ORDER — INSULIN DETEMIR 100 UNIT/ML ~~LOC~~ SOLN: 5.0000 [IU] | Freq: Every day | SUBCUTANEOUS | 11 refills | Status: DC

## 2018-03-07 MED ORDER — INSULIN ASPART 100 UNIT/ML ~~LOC~~ SOLN
0.0000 [IU] | Freq: Three times a day (TID) | SUBCUTANEOUS | Status: DC
  Administered 2018-03-07: 1 [IU] via SUBCUTANEOUS

## 2018-03-07 MED ORDER — INSULIN DETEMIR 100 UNIT/ML ~~LOC~~ SOLN
5.0000 [IU] | Freq: Every day | SUBCUTANEOUS | Status: DC
  Administered 2018-03-07: 5 [IU] via SUBCUTANEOUS
  Filled 2018-03-07: qty 0.05

## 2018-03-07 NOTE — Care Management Obs Status (Signed)
[facility] NOTIFICATION   Patient Details  Name: Lance [name] MRN: [id number] Date of Birth: 08-14-1940   Medicare Observation Status Notification Given:  Yes    Sharin Mons, RN 03/07/2018, 11:10 AM

## 2018-03-07 NOTE — Care Management Note (Signed)
Case Management Note  Patient Details  Name: JAYVIEN ROWLETTE MRN: [id number] Date of Birth: 24-Dec-1940  Subjective/Objective:       Anemia,  hx of ESRD on HD TTS, multiple sclerosis, neurogenic bladder requiring Foley catheter insertion, chronic sacral decubitus ulcers.     Felicita Gage (Spouse) Ly Bacchi [place])    [phone]    PCP: Lavone Orn    Action/Plan: Transition to home with resumption of home health services. [facility] called and services arranged for pt pick and transportation to home per NCM.  Expected Discharge Date:  03/07/18               Expected Discharge Plan:     In-House Referral:     Discharge planning Services  CM Consult  Post Acute Care Choice:  Home Health Choice offered to:  Patient  DME Arranged:   N/A DME Agency:   N/A  HH Arranged:  RN, PT, Nurse's Aide [place] Agency:  [facility]  Status of Service:  Completed, signed off  If discussed at [place] of Stay Meetings, dates discussed:    Additional Comments:  Sharin Mons, RN 03/07/2018, 10:59 AM

## 2018-03-07 NOTE — Progress Notes (Signed)
Called pt. Wife Bethena Roys to inform her that [facility] Harold was here picking her husband up to bring home.     Alphonzo Lemmings, RN

## 2018-03-07 NOTE — Progress Notes (Signed)
Before patient was discharged, writer offered to change his dressings but patient refused. He said his nurse will come today to change all his dressings. I was able to change only the dressing on his sacrum because patient had a BM.

## 2018-03-07 NOTE — Progress Notes (Signed)
[facility] discharged Home per MD order.  Discharge instructions reviewed and discussed with the patient, all questions and concerns answered. Copy of instructions and care notes given to patient.  Pt. Was inquiring how to dress his penis that was oozing some, spoke with Dr. Sloan Leiter, who stated to place a 4 x 4 around it and that Dr. Tammi Klippel will see him in the office on Friday when he has his catheter changed.  Informed pt. Of above and given 4x4's.  Allergies as of 03/07/2018   No Known Allergies     Medication List    TAKE these medications   acetaminophen 325 MG tablet Commonly known as:  TYLENOL Take 2 tablets (650 mg total) by mouth every 6 (six) hours as needed for mild pain (or Fever >/= 101).   baclofen 10 MG tablet Commonly known as:  LIORESAL Take 1 tablet (10 mg total) by mouth at bedtime as needed for muscle spasms. What changed:  when to take this   carvedilol 3.125 MG tablet Commonly known as:  COREG Take 1 tablet (3.125 mg total) by mouth 2 (two) times daily with a meal.   eucerin cream Apply 1 application daily as needed topically for dry skin. For feet   insulin aspart 100 UNIT/ML FlexPen Commonly known as:  NOVOLOG Inject 0-9 Units into the skin 3 (three) times daily with meals. Per sliding scale What changed:  how much to take   insulin detemir 100 UNIT/ML injection Commonly known as:  LEVEMIR Inject 0.05 mLs (5 Units total) into the skin daily with breakfast. What changed:  how much to take   lidocaine-prilocaine cream Commonly known as:  EMLA Apply 1 application topically as needed. What changed:  reasons to take this   Melatonin 3 MG Tabs Take 3 mg by mouth at bedtime.   midodrine 10 MG tablet Commonly known as:  PROAMATINE Take 10 mg by mouth as needed.   Pen Needles 30G X 8 MM Misc Inject 1 each into the skin 4 (four) times daily. Marland Kitchen   SANTYL ointment Generic drug:  collagenase Apply 1 application topically as needed.   simvastatin  20 MG tablet Commonly known as:  ZOCOR Take 20 mg by mouth every evening.   trimethoprim 100 MG tablet Commonly known as:  TRIMPEX Take 100 mg by mouth daily.        IV site discontinued and catheter remains intact. Site without signs and symptoms of complications. Dressing and pressure applied.  Patient escorted to home by [facility],  no distress noted upon discharge.  Wynetta Emery, Stefany Starace C 03/07/2018 11:20 AM

## 2018-03-08 DIAGNOSIS — N2581 Secondary hyperparathyroidism of renal origin: Secondary | ICD-10-CM | POA: Diagnosis not present

## 2018-03-08 DIAGNOSIS — D631 Anemia in chronic kidney disease: Secondary | ICD-10-CM | POA: Diagnosis not present

## 2018-03-08 DIAGNOSIS — Z992 Dependence on renal dialysis: Secondary | ICD-10-CM | POA: Diagnosis not present

## 2018-03-08 DIAGNOSIS — N186 End stage renal disease: Secondary | ICD-10-CM | POA: Diagnosis not present

## 2018-03-08 DIAGNOSIS — D509 Iron deficiency anemia, unspecified: Secondary | ICD-10-CM | POA: Diagnosis not present

## 2018-03-09 DIAGNOSIS — L89323 Pressure ulcer of left buttock, stage 3: Secondary | ICD-10-CM | POA: Diagnosis not present

## 2018-03-09 DIAGNOSIS — L89893 Pressure ulcer of other site, stage 3: Secondary | ICD-10-CM | POA: Diagnosis not present

## 2018-03-09 DIAGNOSIS — L89314 Pressure ulcer of right buttock, stage 4: Secondary | ICD-10-CM | POA: Diagnosis not present

## 2018-03-09 DIAGNOSIS — L97408 Non-pressure chronic ulcer of unspecified heel and midfoot with other specified severity: Secondary | ICD-10-CM | POA: Diagnosis not present

## 2018-03-09 DIAGNOSIS — E11621 Type 2 diabetes mellitus with foot ulcer: Secondary | ICD-10-CM | POA: Diagnosis not present

## 2018-03-09 DIAGNOSIS — L89894 Pressure ulcer of other site, stage 4: Secondary | ICD-10-CM | POA: Diagnosis not present

## 2018-03-10 DIAGNOSIS — D631 Anemia in chronic kidney disease: Secondary | ICD-10-CM | POA: Diagnosis not present

## 2018-03-10 DIAGNOSIS — D509 Iron deficiency anemia, unspecified: Secondary | ICD-10-CM | POA: Diagnosis not present

## 2018-03-10 DIAGNOSIS — Z992 Dependence on renal dialysis: Secondary | ICD-10-CM | POA: Diagnosis not present

## 2018-03-10 DIAGNOSIS — N186 End stage renal disease: Secondary | ICD-10-CM | POA: Diagnosis not present

## 2018-03-10 DIAGNOSIS — N2581 Secondary hyperparathyroidism of renal origin: Secondary | ICD-10-CM | POA: Diagnosis not present

## 2018-03-11 DIAGNOSIS — L89314 Pressure ulcer of right buttock, stage 4: Secondary | ICD-10-CM | POA: Diagnosis not present

## 2018-03-11 DIAGNOSIS — L89894 Pressure ulcer of other site, stage 4: Secondary | ICD-10-CM | POA: Diagnosis not present

## 2018-03-11 DIAGNOSIS — L89893 Pressure ulcer of other site, stage 3: Secondary | ICD-10-CM | POA: Diagnosis not present

## 2018-03-11 DIAGNOSIS — E11621 Type 2 diabetes mellitus with foot ulcer: Secondary | ICD-10-CM | POA: Diagnosis not present

## 2018-03-11 DIAGNOSIS — L89323 Pressure ulcer of left buttock, stage 3: Secondary | ICD-10-CM | POA: Diagnosis not present

## 2018-03-11 DIAGNOSIS — L97408 Non-pressure chronic ulcer of unspecified heel and midfoot with other specified severity: Secondary | ICD-10-CM | POA: Diagnosis not present

## 2018-03-11 DIAGNOSIS — N319 Neuromuscular dysfunction of bladder, unspecified: Secondary | ICD-10-CM | POA: Diagnosis not present

## 2018-03-12 DIAGNOSIS — N2581 Secondary hyperparathyroidism of renal origin: Secondary | ICD-10-CM | POA: Diagnosis not present

## 2018-03-12 DIAGNOSIS — Z992 Dependence on renal dialysis: Secondary | ICD-10-CM | POA: Diagnosis not present

## 2018-03-12 DIAGNOSIS — D509 Iron deficiency anemia, unspecified: Secondary | ICD-10-CM | POA: Diagnosis not present

## 2018-03-12 DIAGNOSIS — N186 End stage renal disease: Secondary | ICD-10-CM | POA: Diagnosis not present

## 2018-03-12 DIAGNOSIS — D631 Anemia in chronic kidney disease: Secondary | ICD-10-CM | POA: Diagnosis not present

## 2018-03-14 DIAGNOSIS — L89893 Pressure ulcer of other site, stage 3: Secondary | ICD-10-CM | POA: Diagnosis not present

## 2018-03-14 DIAGNOSIS — E11621 Type 2 diabetes mellitus with foot ulcer: Secondary | ICD-10-CM | POA: Diagnosis not present

## 2018-03-14 DIAGNOSIS — L89894 Pressure ulcer of other site, stage 4: Secondary | ICD-10-CM | POA: Diagnosis not present

## 2018-03-14 DIAGNOSIS — L89314 Pressure ulcer of right buttock, stage 4: Secondary | ICD-10-CM | POA: Diagnosis not present

## 2018-03-14 DIAGNOSIS — L89323 Pressure ulcer of left buttock, stage 3: Secondary | ICD-10-CM | POA: Diagnosis not present

## 2018-03-14 DIAGNOSIS — L97408 Non-pressure chronic ulcer of unspecified heel and midfoot with other specified severity: Secondary | ICD-10-CM | POA: Diagnosis not present

## 2018-03-15 DIAGNOSIS — N2581 Secondary hyperparathyroidism of renal origin: Secondary | ICD-10-CM | POA: Diagnosis not present

## 2018-03-15 DIAGNOSIS — Z992 Dependence on renal dialysis: Secondary | ICD-10-CM | POA: Diagnosis not present

## 2018-03-15 DIAGNOSIS — N186 End stage renal disease: Secondary | ICD-10-CM | POA: Diagnosis not present

## 2018-03-15 DIAGNOSIS — D509 Iron deficiency anemia, unspecified: Secondary | ICD-10-CM | POA: Diagnosis not present

## 2018-03-15 DIAGNOSIS — D631 Anemia in chronic kidney disease: Secondary | ICD-10-CM | POA: Diagnosis not present

## 2018-03-16 DIAGNOSIS — L97408 Non-pressure chronic ulcer of unspecified heel and midfoot with other specified severity: Secondary | ICD-10-CM | POA: Diagnosis not present

## 2018-03-16 DIAGNOSIS — L89893 Pressure ulcer of other site, stage 3: Secondary | ICD-10-CM | POA: Diagnosis not present

## 2018-03-16 DIAGNOSIS — L89323 Pressure ulcer of left buttock, stage 3: Secondary | ICD-10-CM | POA: Diagnosis not present

## 2018-03-16 DIAGNOSIS — L89894 Pressure ulcer of other site, stage 4: Secondary | ICD-10-CM | POA: Diagnosis not present

## 2018-03-16 DIAGNOSIS — E11621 Type 2 diabetes mellitus with foot ulcer: Secondary | ICD-10-CM | POA: Diagnosis not present

## 2018-03-16 DIAGNOSIS — L89314 Pressure ulcer of right buttock, stage 4: Secondary | ICD-10-CM | POA: Diagnosis not present

## 2018-03-17 DIAGNOSIS — N186 End stage renal disease: Secondary | ICD-10-CM | POA: Diagnosis not present

## 2018-03-17 DIAGNOSIS — L89894 Pressure ulcer of other site, stage 4: Secondary | ICD-10-CM | POA: Diagnosis not present

## 2018-03-17 DIAGNOSIS — N2581 Secondary hyperparathyroidism of renal origin: Secondary | ICD-10-CM | POA: Diagnosis not present

## 2018-03-17 DIAGNOSIS — L89893 Pressure ulcer of other site, stage 3: Secondary | ICD-10-CM | POA: Diagnosis not present

## 2018-03-17 DIAGNOSIS — E11621 Type 2 diabetes mellitus with foot ulcer: Secondary | ICD-10-CM | POA: Diagnosis not present

## 2018-03-17 DIAGNOSIS — D631 Anemia in chronic kidney disease: Secondary | ICD-10-CM | POA: Diagnosis not present

## 2018-03-17 DIAGNOSIS — L89323 Pressure ulcer of left buttock, stage 3: Secondary | ICD-10-CM | POA: Diagnosis not present

## 2018-03-17 DIAGNOSIS — Z992 Dependence on renal dialysis: Secondary | ICD-10-CM | POA: Diagnosis not present

## 2018-03-17 DIAGNOSIS — D509 Iron deficiency anemia, unspecified: Secondary | ICD-10-CM | POA: Diagnosis not present

## 2018-03-17 DIAGNOSIS — L89314 Pressure ulcer of right buttock, stage 4: Secondary | ICD-10-CM | POA: Diagnosis not present

## 2018-03-17 DIAGNOSIS — L97408 Non-pressure chronic ulcer of unspecified heel and midfoot with other specified severity: Secondary | ICD-10-CM | POA: Diagnosis not present

## 2018-03-18 DIAGNOSIS — Z794 Long term (current) use of insulin: Secondary | ICD-10-CM | POA: Diagnosis not present

## 2018-03-18 DIAGNOSIS — L97408 Non-pressure chronic ulcer of unspecified heel and midfoot with other specified severity: Secondary | ICD-10-CM | POA: Diagnosis not present

## 2018-03-18 DIAGNOSIS — L89314 Pressure ulcer of right buttock, stage 4: Secondary | ICD-10-CM | POA: Diagnosis not present

## 2018-03-18 DIAGNOSIS — L89323 Pressure ulcer of left buttock, stage 3: Secondary | ICD-10-CM | POA: Diagnosis not present

## 2018-03-18 DIAGNOSIS — L89150 Pressure ulcer of sacral region, unstageable: Secondary | ICD-10-CM | POA: Diagnosis not present

## 2018-03-18 DIAGNOSIS — L89893 Pressure ulcer of other site, stage 3: Secondary | ICD-10-CM | POA: Diagnosis not present

## 2018-03-18 DIAGNOSIS — L89892 Pressure ulcer of other site, stage 2: Secondary | ICD-10-CM | POA: Diagnosis not present

## 2018-03-18 DIAGNOSIS — L89324 Pressure ulcer of left buttock, stage 4: Secondary | ICD-10-CM | POA: Diagnosis not present

## 2018-03-18 DIAGNOSIS — N183 Chronic kidney disease, stage 3 (moderate): Secondary | ICD-10-CM | POA: Diagnosis not present

## 2018-03-18 DIAGNOSIS — L97522 Non-pressure chronic ulcer of other part of left foot with fat layer exposed: Secondary | ICD-10-CM | POA: Diagnosis not present

## 2018-03-18 DIAGNOSIS — G8220 Paraplegia, unspecified: Secondary | ICD-10-CM | POA: Diagnosis not present

## 2018-03-18 DIAGNOSIS — I1310 Hypertensive heart and chronic kidney disease without heart failure, with stage 1 through stage 4 chronic kidney disease, or unspecified chronic kidney disease: Secondary | ICD-10-CM | POA: Diagnosis not present

## 2018-03-18 DIAGNOSIS — L89894 Pressure ulcer of other site, stage 4: Secondary | ICD-10-CM | POA: Diagnosis not present

## 2018-03-18 DIAGNOSIS — L97512 Non-pressure chronic ulcer of other part of right foot with fat layer exposed: Secondary | ICD-10-CM | POA: Diagnosis not present

## 2018-03-18 DIAGNOSIS — E1122 Type 2 diabetes mellitus with diabetic chronic kidney disease: Secondary | ICD-10-CM | POA: Diagnosis not present

## 2018-03-18 DIAGNOSIS — G35 Multiple sclerosis: Secondary | ICD-10-CM | POA: Diagnosis not present

## 2018-03-18 DIAGNOSIS — E11621 Type 2 diabetes mellitus with foot ulcer: Secondary | ICD-10-CM | POA: Diagnosis not present

## 2018-03-19 DIAGNOSIS — Z992 Dependence on renal dialysis: Secondary | ICD-10-CM | POA: Diagnosis not present

## 2018-03-19 DIAGNOSIS — D509 Iron deficiency anemia, unspecified: Secondary | ICD-10-CM | POA: Diagnosis not present

## 2018-03-19 DIAGNOSIS — N186 End stage renal disease: Secondary | ICD-10-CM | POA: Diagnosis not present

## 2018-03-19 DIAGNOSIS — N2581 Secondary hyperparathyroidism of renal origin: Secondary | ICD-10-CM | POA: Diagnosis not present

## 2018-03-19 DIAGNOSIS — D631 Anemia in chronic kidney disease: Secondary | ICD-10-CM | POA: Diagnosis not present

## 2018-03-21 DIAGNOSIS — L89894 Pressure ulcer of other site, stage 4: Secondary | ICD-10-CM | POA: Diagnosis not present

## 2018-03-21 DIAGNOSIS — L89893 Pressure ulcer of other site, stage 3: Secondary | ICD-10-CM | POA: Diagnosis not present

## 2018-03-21 DIAGNOSIS — L89323 Pressure ulcer of left buttock, stage 3: Secondary | ICD-10-CM | POA: Diagnosis not present

## 2018-03-21 DIAGNOSIS — L97408 Non-pressure chronic ulcer of unspecified heel and midfoot with other specified severity: Secondary | ICD-10-CM | POA: Diagnosis not present

## 2018-03-21 DIAGNOSIS — E11621 Type 2 diabetes mellitus with foot ulcer: Secondary | ICD-10-CM | POA: Diagnosis not present

## 2018-03-21 DIAGNOSIS — L89314 Pressure ulcer of right buttock, stage 4: Secondary | ICD-10-CM | POA: Diagnosis not present

## 2018-03-22 DIAGNOSIS — Z992 Dependence on renal dialysis: Secondary | ICD-10-CM | POA: Diagnosis not present

## 2018-03-22 DIAGNOSIS — N186 End stage renal disease: Secondary | ICD-10-CM | POA: Diagnosis not present

## 2018-03-22 DIAGNOSIS — D631 Anemia in chronic kidney disease: Secondary | ICD-10-CM | POA: Diagnosis not present

## 2018-03-22 DIAGNOSIS — N2581 Secondary hyperparathyroidism of renal origin: Secondary | ICD-10-CM | POA: Diagnosis not present

## 2018-03-22 DIAGNOSIS — D509 Iron deficiency anemia, unspecified: Secondary | ICD-10-CM | POA: Diagnosis not present

## 2018-03-23 DIAGNOSIS — E11621 Type 2 diabetes mellitus with foot ulcer: Secondary | ICD-10-CM | POA: Diagnosis not present

## 2018-03-23 DIAGNOSIS — L89314 Pressure ulcer of right buttock, stage 4: Secondary | ICD-10-CM | POA: Diagnosis not present

## 2018-03-23 DIAGNOSIS — L97408 Non-pressure chronic ulcer of unspecified heel and midfoot with other specified severity: Secondary | ICD-10-CM | POA: Diagnosis not present

## 2018-03-23 DIAGNOSIS — L89893 Pressure ulcer of other site, stage 3: Secondary | ICD-10-CM | POA: Diagnosis not present

## 2018-03-23 DIAGNOSIS — L89323 Pressure ulcer of left buttock, stage 3: Secondary | ICD-10-CM | POA: Diagnosis not present

## 2018-03-23 DIAGNOSIS — L89894 Pressure ulcer of other site, stage 4: Secondary | ICD-10-CM | POA: Diagnosis not present

## 2018-03-24 DIAGNOSIS — N2581 Secondary hyperparathyroidism of renal origin: Secondary | ICD-10-CM | POA: Diagnosis not present

## 2018-03-24 DIAGNOSIS — Z992 Dependence on renal dialysis: Secondary | ICD-10-CM | POA: Diagnosis not present

## 2018-03-24 DIAGNOSIS — D631 Anemia in chronic kidney disease: Secondary | ICD-10-CM | POA: Diagnosis not present

## 2018-03-24 DIAGNOSIS — D509 Iron deficiency anemia, unspecified: Secondary | ICD-10-CM | POA: Diagnosis not present

## 2018-03-24 DIAGNOSIS — N186 End stage renal disease: Secondary | ICD-10-CM | POA: Diagnosis not present

## 2018-03-25 DIAGNOSIS — L97408 Non-pressure chronic ulcer of unspecified heel and midfoot with other specified severity: Secondary | ICD-10-CM | POA: Diagnosis not present

## 2018-03-25 DIAGNOSIS — L89314 Pressure ulcer of right buttock, stage 4: Secondary | ICD-10-CM | POA: Diagnosis not present

## 2018-03-25 DIAGNOSIS — L89893 Pressure ulcer of other site, stage 3: Secondary | ICD-10-CM | POA: Diagnosis not present

## 2018-03-25 DIAGNOSIS — L89323 Pressure ulcer of left buttock, stage 3: Secondary | ICD-10-CM | POA: Diagnosis not present

## 2018-03-25 DIAGNOSIS — L89894 Pressure ulcer of other site, stage 4: Secondary | ICD-10-CM | POA: Diagnosis not present

## 2018-03-25 DIAGNOSIS — E11621 Type 2 diabetes mellitus with foot ulcer: Secondary | ICD-10-CM | POA: Diagnosis not present

## 2018-03-26 DIAGNOSIS — D509 Iron deficiency anemia, unspecified: Secondary | ICD-10-CM | POA: Diagnosis not present

## 2018-03-26 DIAGNOSIS — N2581 Secondary hyperparathyroidism of renal origin: Secondary | ICD-10-CM | POA: Diagnosis not present

## 2018-03-26 DIAGNOSIS — N186 End stage renal disease: Secondary | ICD-10-CM | POA: Diagnosis not present

## 2018-03-26 DIAGNOSIS — D631 Anemia in chronic kidney disease: Secondary | ICD-10-CM | POA: Diagnosis not present

## 2018-03-26 DIAGNOSIS — Z992 Dependence on renal dialysis: Secondary | ICD-10-CM | POA: Diagnosis not present

## 2018-03-28 DIAGNOSIS — L89323 Pressure ulcer of left buttock, stage 3: Secondary | ICD-10-CM | POA: Diagnosis not present

## 2018-03-28 DIAGNOSIS — N186 End stage renal disease: Secondary | ICD-10-CM | POA: Diagnosis not present

## 2018-03-28 DIAGNOSIS — L89894 Pressure ulcer of other site, stage 4: Secondary | ICD-10-CM | POA: Diagnosis not present

## 2018-03-28 DIAGNOSIS — L97408 Non-pressure chronic ulcer of unspecified heel and midfoot with other specified severity: Secondary | ICD-10-CM | POA: Diagnosis not present

## 2018-03-28 DIAGNOSIS — Z992 Dependence on renal dialysis: Secondary | ICD-10-CM | POA: Diagnosis not present

## 2018-03-28 DIAGNOSIS — L89893 Pressure ulcer of other site, stage 3: Secondary | ICD-10-CM | POA: Diagnosis not present

## 2018-03-28 DIAGNOSIS — D509 Iron deficiency anemia, unspecified: Secondary | ICD-10-CM | POA: Diagnosis not present

## 2018-03-28 DIAGNOSIS — L89314 Pressure ulcer of right buttock, stage 4: Secondary | ICD-10-CM | POA: Diagnosis not present

## 2018-03-28 DIAGNOSIS — E11621 Type 2 diabetes mellitus with foot ulcer: Secondary | ICD-10-CM | POA: Diagnosis not present

## 2018-03-28 DIAGNOSIS — N2581 Secondary hyperparathyroidism of renal origin: Secondary | ICD-10-CM | POA: Diagnosis not present

## 2018-03-28 DIAGNOSIS — D631 Anemia in chronic kidney disease: Secondary | ICD-10-CM | POA: Diagnosis not present

## 2018-03-29 DIAGNOSIS — L89314 Pressure ulcer of right buttock, stage 4: Secondary | ICD-10-CM | POA: Diagnosis not present

## 2018-03-29 DIAGNOSIS — E11621 Type 2 diabetes mellitus with foot ulcer: Secondary | ICD-10-CM | POA: Diagnosis not present

## 2018-03-29 DIAGNOSIS — L89893 Pressure ulcer of other site, stage 3: Secondary | ICD-10-CM | POA: Diagnosis not present

## 2018-03-29 DIAGNOSIS — L97408 Non-pressure chronic ulcer of unspecified heel and midfoot with other specified severity: Secondary | ICD-10-CM | POA: Diagnosis not present

## 2018-03-29 DIAGNOSIS — L89323 Pressure ulcer of left buttock, stage 3: Secondary | ICD-10-CM | POA: Diagnosis not present

## 2018-03-29 DIAGNOSIS — L89894 Pressure ulcer of other site, stage 4: Secondary | ICD-10-CM | POA: Diagnosis not present

## 2018-03-30 DIAGNOSIS — L89893 Pressure ulcer of other site, stage 3: Secondary | ICD-10-CM | POA: Diagnosis not present

## 2018-03-30 DIAGNOSIS — E11621 Type 2 diabetes mellitus with foot ulcer: Secondary | ICD-10-CM | POA: Diagnosis not present

## 2018-03-30 DIAGNOSIS — L97408 Non-pressure chronic ulcer of unspecified heel and midfoot with other specified severity: Secondary | ICD-10-CM | POA: Diagnosis not present

## 2018-03-30 DIAGNOSIS — L89314 Pressure ulcer of right buttock, stage 4: Secondary | ICD-10-CM | POA: Diagnosis not present

## 2018-03-30 DIAGNOSIS — L89323 Pressure ulcer of left buttock, stage 3: Secondary | ICD-10-CM | POA: Diagnosis not present

## 2018-03-30 DIAGNOSIS — L89894 Pressure ulcer of other site, stage 4: Secondary | ICD-10-CM | POA: Diagnosis not present

## 2018-04-01 DIAGNOSIS — E11621 Type 2 diabetes mellitus with foot ulcer: Secondary | ICD-10-CM | POA: Diagnosis not present

## 2018-04-01 DIAGNOSIS — L89894 Pressure ulcer of other site, stage 4: Secondary | ICD-10-CM | POA: Diagnosis not present

## 2018-04-01 DIAGNOSIS — L89893 Pressure ulcer of other site, stage 3: Secondary | ICD-10-CM | POA: Diagnosis not present

## 2018-04-01 DIAGNOSIS — L89323 Pressure ulcer of left buttock, stage 3: Secondary | ICD-10-CM | POA: Diagnosis not present

## 2018-04-01 DIAGNOSIS — L97408 Non-pressure chronic ulcer of unspecified heel and midfoot with other specified severity: Secondary | ICD-10-CM | POA: Diagnosis not present

## 2018-04-01 DIAGNOSIS — L89314 Pressure ulcer of right buttock, stage 4: Secondary | ICD-10-CM | POA: Diagnosis not present

## 2018-04-02 DIAGNOSIS — N2581 Secondary hyperparathyroidism of renal origin: Secondary | ICD-10-CM | POA: Diagnosis not present

## 2018-04-02 DIAGNOSIS — Z992 Dependence on renal dialysis: Secondary | ICD-10-CM | POA: Diagnosis not present

## 2018-04-02 DIAGNOSIS — D631 Anemia in chronic kidney disease: Secondary | ICD-10-CM | POA: Diagnosis not present

## 2018-04-02 DIAGNOSIS — N186 End stage renal disease: Secondary | ICD-10-CM | POA: Diagnosis not present

## 2018-04-02 DIAGNOSIS — D509 Iron deficiency anemia, unspecified: Secondary | ICD-10-CM | POA: Diagnosis not present

## 2018-04-03 DIAGNOSIS — Z992 Dependence on renal dialysis: Secondary | ICD-10-CM | POA: Diagnosis not present

## 2018-04-03 DIAGNOSIS — N186 End stage renal disease: Secondary | ICD-10-CM | POA: Diagnosis not present

## 2018-04-03 DIAGNOSIS — E1122 Type 2 diabetes mellitus with diabetic chronic kidney disease: Secondary | ICD-10-CM | POA: Diagnosis not present

## 2018-04-04 DIAGNOSIS — L89323 Pressure ulcer of left buttock, stage 3: Secondary | ICD-10-CM | POA: Diagnosis not present

## 2018-04-04 DIAGNOSIS — L97408 Non-pressure chronic ulcer of unspecified heel and midfoot with other specified severity: Secondary | ICD-10-CM | POA: Diagnosis not present

## 2018-04-04 DIAGNOSIS — L89894 Pressure ulcer of other site, stage 4: Secondary | ICD-10-CM | POA: Diagnosis not present

## 2018-04-04 DIAGNOSIS — L89314 Pressure ulcer of right buttock, stage 4: Secondary | ICD-10-CM | POA: Diagnosis not present

## 2018-04-04 DIAGNOSIS — E11621 Type 2 diabetes mellitus with foot ulcer: Secondary | ICD-10-CM | POA: Diagnosis not present

## 2018-04-04 DIAGNOSIS — L89893 Pressure ulcer of other site, stage 3: Secondary | ICD-10-CM | POA: Diagnosis not present

## 2018-04-05 DIAGNOSIS — N186 End stage renal disease: Secondary | ICD-10-CM | POA: Diagnosis not present

## 2018-04-05 DIAGNOSIS — D631 Anemia in chronic kidney disease: Secondary | ICD-10-CM | POA: Diagnosis not present

## 2018-04-05 DIAGNOSIS — N2581 Secondary hyperparathyroidism of renal origin: Secondary | ICD-10-CM | POA: Diagnosis not present

## 2018-04-05 DIAGNOSIS — E1122 Type 2 diabetes mellitus with diabetic chronic kidney disease: Secondary | ICD-10-CM | POA: Diagnosis not present

## 2018-04-05 DIAGNOSIS — Z992 Dependence on renal dialysis: Secondary | ICD-10-CM | POA: Diagnosis not present

## 2018-04-05 DIAGNOSIS — D509 Iron deficiency anemia, unspecified: Secondary | ICD-10-CM | POA: Diagnosis not present

## 2018-04-06 DIAGNOSIS — E11621 Type 2 diabetes mellitus with foot ulcer: Secondary | ICD-10-CM | POA: Diagnosis not present

## 2018-04-06 DIAGNOSIS — L97408 Non-pressure chronic ulcer of unspecified heel and midfoot with other specified severity: Secondary | ICD-10-CM | POA: Diagnosis not present

## 2018-04-06 DIAGNOSIS — L89894 Pressure ulcer of other site, stage 4: Secondary | ICD-10-CM | POA: Diagnosis not present

## 2018-04-06 DIAGNOSIS — L89323 Pressure ulcer of left buttock, stage 3: Secondary | ICD-10-CM | POA: Diagnosis not present

## 2018-04-06 DIAGNOSIS — L89314 Pressure ulcer of right buttock, stage 4: Secondary | ICD-10-CM | POA: Diagnosis not present

## 2018-04-06 DIAGNOSIS — N319 Neuromuscular dysfunction of bladder, unspecified: Secondary | ICD-10-CM | POA: Diagnosis not present

## 2018-04-06 DIAGNOSIS — L89893 Pressure ulcer of other site, stage 3: Secondary | ICD-10-CM | POA: Diagnosis not present

## 2018-04-07 DIAGNOSIS — D509 Iron deficiency anemia, unspecified: Secondary | ICD-10-CM | POA: Diagnosis not present

## 2018-04-07 DIAGNOSIS — Z992 Dependence on renal dialysis: Secondary | ICD-10-CM | POA: Diagnosis not present

## 2018-04-07 DIAGNOSIS — D631 Anemia in chronic kidney disease: Secondary | ICD-10-CM | POA: Diagnosis not present

## 2018-04-07 DIAGNOSIS — E1122 Type 2 diabetes mellitus with diabetic chronic kidney disease: Secondary | ICD-10-CM | POA: Diagnosis not present

## 2018-04-07 DIAGNOSIS — N2581 Secondary hyperparathyroidism of renal origin: Secondary | ICD-10-CM | POA: Diagnosis not present

## 2018-04-07 DIAGNOSIS — N186 End stage renal disease: Secondary | ICD-10-CM | POA: Diagnosis not present

## 2018-04-08 ENCOUNTER — Encounter (HOSPITAL_BASED_OUTPATIENT_CLINIC_OR_DEPARTMENT_OTHER): Payer: Medicare Other | Attending: Internal Medicine

## 2018-04-08 DIAGNOSIS — E1122 Type 2 diabetes mellitus with diabetic chronic kidney disease: Secondary | ICD-10-CM | POA: Diagnosis not present

## 2018-04-08 DIAGNOSIS — L89892 Pressure ulcer of other site, stage 2: Secondary | ICD-10-CM | POA: Insufficient documentation

## 2018-04-08 DIAGNOSIS — L89314 Pressure ulcer of right buttock, stage 4: Secondary | ICD-10-CM | POA: Diagnosis not present

## 2018-04-08 DIAGNOSIS — E11621 Type 2 diabetes mellitus with foot ulcer: Secondary | ICD-10-CM | POA: Diagnosis not present

## 2018-04-08 DIAGNOSIS — I509 Heart failure, unspecified: Secondary | ICD-10-CM | POA: Diagnosis not present

## 2018-04-08 DIAGNOSIS — L89150 Pressure ulcer of sacral region, unstageable: Secondary | ICD-10-CM | POA: Insufficient documentation

## 2018-04-08 DIAGNOSIS — L97522 Non-pressure chronic ulcer of other part of left foot with fat layer exposed: Secondary | ICD-10-CM | POA: Insufficient documentation

## 2018-04-08 DIAGNOSIS — G8220 Paraplegia, unspecified: Secondary | ICD-10-CM | POA: Diagnosis not present

## 2018-04-08 DIAGNOSIS — L97515 Non-pressure chronic ulcer of other part of right foot with muscle involvement without evidence of necrosis: Secondary | ICD-10-CM | POA: Diagnosis not present

## 2018-04-08 DIAGNOSIS — I132 Hypertensive heart and chronic kidney disease with heart failure and with stage 5 chronic kidney disease, or end stage renal disease: Secondary | ICD-10-CM | POA: Insufficient documentation

## 2018-04-08 DIAGNOSIS — N186 End stage renal disease: Secondary | ICD-10-CM | POA: Insufficient documentation

## 2018-04-08 DIAGNOSIS — L97512 Non-pressure chronic ulcer of other part of right foot with fat layer exposed: Secondary | ICD-10-CM | POA: Insufficient documentation

## 2018-04-09 DIAGNOSIS — D509 Iron deficiency anemia, unspecified: Secondary | ICD-10-CM | POA: Diagnosis not present

## 2018-04-09 DIAGNOSIS — N2581 Secondary hyperparathyroidism of renal origin: Secondary | ICD-10-CM | POA: Diagnosis not present

## 2018-04-09 DIAGNOSIS — N186 End stage renal disease: Secondary | ICD-10-CM | POA: Diagnosis not present

## 2018-04-09 DIAGNOSIS — D631 Anemia in chronic kidney disease: Secondary | ICD-10-CM | POA: Diagnosis not present

## 2018-04-09 DIAGNOSIS — Z992 Dependence on renal dialysis: Secondary | ICD-10-CM | POA: Diagnosis not present

## 2018-04-09 DIAGNOSIS — E1122 Type 2 diabetes mellitus with diabetic chronic kidney disease: Secondary | ICD-10-CM | POA: Diagnosis not present

## 2018-04-11 DIAGNOSIS — L89314 Pressure ulcer of right buttock, stage 4: Secondary | ICD-10-CM | POA: Diagnosis not present

## 2018-04-11 DIAGNOSIS — L89893 Pressure ulcer of other site, stage 3: Secondary | ICD-10-CM | POA: Diagnosis not present

## 2018-04-11 DIAGNOSIS — L97408 Non-pressure chronic ulcer of unspecified heel and midfoot with other specified severity: Secondary | ICD-10-CM | POA: Diagnosis not present

## 2018-04-11 DIAGNOSIS — E11621 Type 2 diabetes mellitus with foot ulcer: Secondary | ICD-10-CM | POA: Diagnosis not present

## 2018-04-11 DIAGNOSIS — L89323 Pressure ulcer of left buttock, stage 3: Secondary | ICD-10-CM | POA: Diagnosis not present

## 2018-04-11 DIAGNOSIS — L89894 Pressure ulcer of other site, stage 4: Secondary | ICD-10-CM | POA: Diagnosis not present

## 2018-04-12 DIAGNOSIS — N186 End stage renal disease: Secondary | ICD-10-CM | POA: Diagnosis not present

## 2018-04-12 DIAGNOSIS — N2581 Secondary hyperparathyroidism of renal origin: Secondary | ICD-10-CM | POA: Diagnosis not present

## 2018-04-12 DIAGNOSIS — D509 Iron deficiency anemia, unspecified: Secondary | ICD-10-CM | POA: Diagnosis not present

## 2018-04-12 DIAGNOSIS — E1122 Type 2 diabetes mellitus with diabetic chronic kidney disease: Secondary | ICD-10-CM | POA: Diagnosis not present

## 2018-04-12 DIAGNOSIS — D631 Anemia in chronic kidney disease: Secondary | ICD-10-CM | POA: Diagnosis not present

## 2018-04-12 DIAGNOSIS — Z992 Dependence on renal dialysis: Secondary | ICD-10-CM | POA: Diagnosis not present

## 2018-04-13 DIAGNOSIS — L89894 Pressure ulcer of other site, stage 4: Secondary | ICD-10-CM | POA: Diagnosis not present

## 2018-04-13 DIAGNOSIS — L89323 Pressure ulcer of left buttock, stage 3: Secondary | ICD-10-CM | POA: Diagnosis not present

## 2018-04-13 DIAGNOSIS — L97408 Non-pressure chronic ulcer of unspecified heel and midfoot with other specified severity: Secondary | ICD-10-CM | POA: Diagnosis not present

## 2018-04-13 DIAGNOSIS — L89893 Pressure ulcer of other site, stage 3: Secondary | ICD-10-CM | POA: Diagnosis not present

## 2018-04-13 DIAGNOSIS — L89314 Pressure ulcer of right buttock, stage 4: Secondary | ICD-10-CM | POA: Diagnosis not present

## 2018-04-13 DIAGNOSIS — E11621 Type 2 diabetes mellitus with foot ulcer: Secondary | ICD-10-CM | POA: Diagnosis not present

## 2018-04-14 DIAGNOSIS — D509 Iron deficiency anemia, unspecified: Secondary | ICD-10-CM | POA: Diagnosis not present

## 2018-04-14 DIAGNOSIS — N2581 Secondary hyperparathyroidism of renal origin: Secondary | ICD-10-CM | POA: Diagnosis not present

## 2018-04-14 DIAGNOSIS — D631 Anemia in chronic kidney disease: Secondary | ICD-10-CM | POA: Diagnosis not present

## 2018-04-14 DIAGNOSIS — N186 End stage renal disease: Secondary | ICD-10-CM | POA: Diagnosis not present

## 2018-04-14 DIAGNOSIS — Z992 Dependence on renal dialysis: Secondary | ICD-10-CM | POA: Diagnosis not present

## 2018-04-14 DIAGNOSIS — E1122 Type 2 diabetes mellitus with diabetic chronic kidney disease: Secondary | ICD-10-CM | POA: Diagnosis not present

## 2018-04-15 DIAGNOSIS — L89894 Pressure ulcer of other site, stage 4: Secondary | ICD-10-CM | POA: Diagnosis not present

## 2018-04-15 DIAGNOSIS — L97408 Non-pressure chronic ulcer of unspecified heel and midfoot with other specified severity: Secondary | ICD-10-CM | POA: Diagnosis not present

## 2018-04-15 DIAGNOSIS — L89314 Pressure ulcer of right buttock, stage 4: Secondary | ICD-10-CM | POA: Diagnosis not present

## 2018-04-15 DIAGNOSIS — L89893 Pressure ulcer of other site, stage 3: Secondary | ICD-10-CM | POA: Diagnosis not present

## 2018-04-15 DIAGNOSIS — L89323 Pressure ulcer of left buttock, stage 3: Secondary | ICD-10-CM | POA: Diagnosis not present

## 2018-04-15 DIAGNOSIS — E11621 Type 2 diabetes mellitus with foot ulcer: Secondary | ICD-10-CM | POA: Diagnosis not present

## 2018-04-16 DIAGNOSIS — D631 Anemia in chronic kidney disease: Secondary | ICD-10-CM | POA: Diagnosis not present

## 2018-04-16 DIAGNOSIS — D509 Iron deficiency anemia, unspecified: Secondary | ICD-10-CM | POA: Diagnosis not present

## 2018-04-16 DIAGNOSIS — N186 End stage renal disease: Secondary | ICD-10-CM | POA: Diagnosis not present

## 2018-04-16 DIAGNOSIS — E1122 Type 2 diabetes mellitus with diabetic chronic kidney disease: Secondary | ICD-10-CM | POA: Diagnosis not present

## 2018-04-16 DIAGNOSIS — N2581 Secondary hyperparathyroidism of renal origin: Secondary | ICD-10-CM | POA: Diagnosis not present

## 2018-04-16 DIAGNOSIS — Z992 Dependence on renal dialysis: Secondary | ICD-10-CM | POA: Diagnosis not present

## 2018-04-18 ENCOUNTER — Emergency Department (HOSPITAL_COMMUNITY): Payer: Medicare Other

## 2018-04-18 ENCOUNTER — Encounter (HOSPITAL_COMMUNITY): Payer: Self-pay

## 2018-04-18 ENCOUNTER — Other Ambulatory Visit: Payer: Self-pay

## 2018-04-18 ENCOUNTER — Observation Stay (HOSPITAL_COMMUNITY)
Admission: EM | Admit: 2018-04-18 | Discharge: 2018-04-19 | Disposition: A | Discharge status: Home / Self Care | Payer: Medicare Other | Attending: Family Medicine | Admitting: Family Medicine

## 2018-04-18 DIAGNOSIS — Z794 Long term (current) use of insulin: Secondary | ICD-10-CM | POA: Insufficient documentation

## 2018-04-18 DIAGNOSIS — G8220 Paraplegia, unspecified: Secondary | ICD-10-CM | POA: Insufficient documentation

## 2018-04-18 DIAGNOSIS — I4891 Unspecified atrial fibrillation: Secondary | ICD-10-CM | POA: Diagnosis not present

## 2018-04-18 DIAGNOSIS — D631 Anemia in chronic kidney disease: Secondary | ICD-10-CM | POA: Diagnosis not present

## 2018-04-18 DIAGNOSIS — E1122 Type 2 diabetes mellitus with diabetic chronic kidney disease: Secondary | ICD-10-CM | POA: Insufficient documentation

## 2018-04-18 DIAGNOSIS — R531 Weakness: Secondary | ICD-10-CM | POA: Diagnosis not present

## 2018-04-18 DIAGNOSIS — E1169 Type 2 diabetes mellitus with other specified complication: Secondary | ICD-10-CM

## 2018-04-18 DIAGNOSIS — L89893 Pressure ulcer of other site, stage 3: Secondary | ICD-10-CM | POA: Diagnosis not present

## 2018-04-18 DIAGNOSIS — L89154 Pressure ulcer of sacral region, stage 4: Secondary | ICD-10-CM | POA: Diagnosis not present

## 2018-04-18 DIAGNOSIS — N319 Neuromuscular dysfunction of bladder, unspecified: Secondary | ICD-10-CM | POA: Insufficient documentation

## 2018-04-18 DIAGNOSIS — R0689 Other abnormalities of breathing: Secondary | ICD-10-CM | POA: Diagnosis not present

## 2018-04-18 DIAGNOSIS — L97408 Non-pressure chronic ulcer of unspecified heel and midfoot with other specified severity: Secondary | ICD-10-CM | POA: Diagnosis not present

## 2018-04-18 DIAGNOSIS — R509 Fever, unspecified: Secondary | ICD-10-CM | POA: Diagnosis not present

## 2018-04-18 DIAGNOSIS — Z992 Dependence on renal dialysis: Secondary | ICD-10-CM | POA: Diagnosis not present

## 2018-04-18 DIAGNOSIS — R000 Tachycardia, unspecified: Secondary | ICD-10-CM | POA: Diagnosis not present

## 2018-04-18 DIAGNOSIS — G35 Multiple sclerosis: Secondary | ICD-10-CM | POA: Insufficient documentation

## 2018-04-18 DIAGNOSIS — R799 Abnormal finding of blood chemistry, unspecified: Secondary | ICD-10-CM | POA: Diagnosis present

## 2018-04-18 DIAGNOSIS — Z7401 Bed confinement status: Secondary | ICD-10-CM | POA: Insufficient documentation

## 2018-04-18 DIAGNOSIS — N186 End stage renal disease: Secondary | ICD-10-CM | POA: Insufficient documentation

## 2018-04-18 DIAGNOSIS — L97529 Non-pressure chronic ulcer of other part of left foot with unspecified severity: Secondary | ICD-10-CM | POA: Diagnosis not present

## 2018-04-18 DIAGNOSIS — Z8614 Personal history of Methicillin resistant Staphylococcus aureus infection: Secondary | ICD-10-CM | POA: Diagnosis not present

## 2018-04-18 DIAGNOSIS — I959 Hypotension, unspecified: Secondary | ICD-10-CM | POA: Diagnosis not present

## 2018-04-18 DIAGNOSIS — L89323 Pressure ulcer of left buttock, stage 3: Secondary | ICD-10-CM | POA: Diagnosis not present

## 2018-04-18 DIAGNOSIS — E11621 Type 2 diabetes mellitus with foot ulcer: Secondary | ICD-10-CM | POA: Diagnosis not present

## 2018-04-18 DIAGNOSIS — E785 Hyperlipidemia, unspecified: Secondary | ICD-10-CM

## 2018-04-18 DIAGNOSIS — R9431 Abnormal electrocardiogram [ECG] [EKG]: Secondary | ICD-10-CM | POA: Insufficient documentation

## 2018-04-18 DIAGNOSIS — Z66 Do not resuscitate: Secondary | ICD-10-CM | POA: Insufficient documentation

## 2018-04-18 DIAGNOSIS — I5042 Chronic combined systolic (congestive) and diastolic (congestive) heart failure: Secondary | ICD-10-CM | POA: Insufficient documentation

## 2018-04-18 DIAGNOSIS — L89894 Pressure ulcer of other site, stage 4: Secondary | ICD-10-CM | POA: Diagnosis not present

## 2018-04-18 DIAGNOSIS — I132 Hypertensive heart and chronic kidney disease with heart failure and with stage 5 chronic kidney disease, or end stage renal disease: Principal | ICD-10-CM | POA: Insufficient documentation

## 2018-04-18 DIAGNOSIS — D649 Anemia, unspecified: Secondary | ICD-10-CM | POA: Diagnosis not present

## 2018-04-18 DIAGNOSIS — L89314 Pressure ulcer of right buttock, stage 4: Secondary | ICD-10-CM | POA: Diagnosis not present

## 2018-04-18 DIAGNOSIS — L97519 Non-pressure chronic ulcer of other part of right foot with unspecified severity: Secondary | ICD-10-CM | POA: Diagnosis not present

## 2018-04-18 LAB — CBC WITH DIFFERENTIAL/PLATELET
ABS IMMATURE GRANULOCYTES: 0.05 10*3/uL (ref 0.00–0.07)
Basophils Absolute: 0 10*3/uL (ref 0.0–0.1)
Basophils Relative: 0 %
Eosinophils Absolute: 0.2 10*3/uL (ref 0.0–0.5)
Eosinophils Relative: 1 %
HCT: 22.8 % — ABNORMAL LOW (ref 39.0–52.0)
HEMOGLOBIN: 6.5 g/dL — AB (ref 13.0–17.0)
IMMATURE GRANULOCYTES: 1 %
LYMPHS PCT: 10 %
Lymphs Abs: 1.1 10*3/uL (ref 0.7–4.0)
MCH: 29.4 pg (ref 26.0–34.0)
MCHC: 28.5 g/dL — ABNORMAL LOW (ref 30.0–36.0)
MCV: 103.2 fL — ABNORMAL HIGH (ref 80.0–100.0)
Monocytes Absolute: 0.7 10*3/uL (ref 0.1–1.0)
Monocytes Relative: 7 %
NEUTROS ABS: 8.5 10*3/uL — AB (ref 1.7–7.7)
NEUTROS PCT: 81 %
Platelets: 254 10*3/uL (ref 150–400)
RBC: 2.21 MIL/uL — AB (ref 4.22–5.81)
RDW: 17 % — ABNORMAL HIGH (ref 11.5–15.5)
WBC: 10.5 10*3/uL (ref 4.0–10.5)
nRBC: 0 % (ref 0.0–0.2)

## 2018-04-18 LAB — COMPREHENSIVE METABOLIC PANEL
ALBUMIN: 1.9 g/dL — AB (ref 3.5–5.0)
ALT: 11 U/L (ref 0–44)
AST: 25 U/L (ref 15–41)
Alkaline Phosphatase: 64 U/L (ref 38–126)
Anion gap: 12 (ref 5–15)
BUN: 31 mg/dL — ABNORMAL HIGH (ref 8–23)
CHLORIDE: 97 mmol/L — AB (ref 98–111)
CO2: 29 mmol/L (ref 22–32)
Calcium: 8.8 mg/dL — ABNORMAL LOW (ref 8.9–10.3)
Creatinine, Ser: 3.21 mg/dL — ABNORMAL HIGH (ref 0.61–1.24)
GFR calc Af Amer: 20 mL/min — ABNORMAL LOW (ref >60)
GFR, EST NON AFRICAN AMERICAN: 18 mL/min — AB (ref >60)
GLUCOSE: 64 mg/dL — AB (ref 70–99)
POTASSIUM: 4.1 mmol/L (ref 3.5–5.1)
Sodium: 138 mmol/L (ref 135–145)
Total Bilirubin: 0.8 mg/dL (ref 0.3–1.2)
Total Protein: 7.6 g/dL (ref 6.5–8.1)

## 2018-04-18 LAB — URINALYSIS, ROUTINE W REFLEX MICROSCOPIC
BILIRUBIN URINE: NEGATIVE
GLUCOSE, UA: NEGATIVE mg/dL
Ketones, ur: 5 mg/dL — AB
NITRITE: NEGATIVE
PH: 9 — AB (ref 5.0–8.0)
Protein, ur: 100 mg/dL — AB
SPECIFIC GRAVITY, URINE: 1.011 (ref 1.005–1.030)

## 2018-04-18 LAB — I-STAT CHEM 8, ED
BUN: 30 mg/dL — ABNORMAL HIGH (ref 8–23)
CALCIUM ION: 1.1 mmol/L — AB (ref 1.15–1.40)
CHLORIDE: 97 mmol/L — AB (ref 98–111)
CREATININE: 2.9 mg/dL — AB (ref 0.61–1.24)
GLUCOSE: 60 mg/dL — AB (ref 70–99)
HCT: 21 % — ABNORMAL LOW (ref 39.0–52.0)
Hemoglobin: 7.1 g/dL — ABNORMAL LOW (ref 13.0–17.0)
Potassium: 4 mmol/L (ref 3.5–5.1)
Sodium: 136 mmol/L (ref 135–145)
TCO2: 32 mmol/L (ref 22–32)

## 2018-04-18 LAB — CBG MONITORING, ED
Glucose-Capillary: 44 mg/dL — CL (ref 70–99)
Glucose-Capillary: 85 mg/dL (ref 70–99)

## 2018-04-18 LAB — GLUCOSE, CAPILLARY: Glucose-Capillary: 108 mg/dL — ABNORMAL HIGH (ref 70–99)

## 2018-04-18 LAB — I-STAT CG4 LACTIC ACID, ED
LACTIC ACID, VENOUS: 1.95 mmol/L — AB (ref 0.5–1.9)
Lactic Acid, Venous: 1.63 mmol/L (ref 0.5–1.9)

## 2018-04-18 LAB — PREPARE RBC (CROSSMATCH)

## 2018-04-18 LAB — POC OCCULT BLOOD, ED: Fecal Occult Bld: NEGATIVE

## 2018-04-18 MED ORDER — BACLOFEN 10 MG PO TABS
10.0000 mg | ORAL_TABLET | Freq: Every day | ORAL | Status: DC
  Administered 2018-04-18 – 2018-04-19 (×2): 10 mg via ORAL
  Filled 2018-04-18: qty 1
  Filled 2018-04-18: qty 2

## 2018-04-18 MED ORDER — SODIUM CHLORIDE 0.9% IV SOLUTION: Freq: Once | INTRAVENOUS | Status: DC

## 2018-04-18 MED ORDER — INSULIN ASPART 100 UNIT/ML ~~LOC~~ SOLN
0.0000 [IU] | Freq: Three times a day (TID) | SUBCUTANEOUS | Status: DC
  Administered 2018-04-19: 3 [IU] via SUBCUTANEOUS

## 2018-04-18 MED ORDER — MELATONIN 3 MG PO TABS
3.0000 mg | ORAL_TABLET | Freq: Every day | ORAL | Status: DC
  Administered 2018-04-18: 3 mg via ORAL
  Filled 2018-04-18 (×2): qty 1

## 2018-04-18 MED ORDER — FUROSEMIDE 10 MG/ML IJ SOLN
20.0000 mg | Freq: Once | INTRAMUSCULAR | Status: AC
  Administered 2018-04-18: 20 mg via INTRAVENOUS
  Filled 2018-04-18: qty 2

## 2018-04-18 MED ORDER — SODIUM CHLORIDE 0.9 % IV SOLN: 10.0000 mL/h | Freq: Once | INTRAVENOUS | Status: DC

## 2018-04-18 MED ORDER — SIMVASTATIN 20 MG PO TABS
20.0000 mg | ORAL_TABLET | Freq: Every evening | ORAL | Status: DC
  Administered 2018-04-18 – 2018-04-19 (×2): 20 mg via ORAL
  Filled 2018-04-18 (×2): qty 1

## 2018-04-18 MED ORDER — TRIMETHOPRIM 100 MG PO TABS
100.0000 mg | ORAL_TABLET | Freq: Every day | ORAL | Status: DC
  Administered 2018-04-19: 100 mg via ORAL
  Filled 2018-04-18: qty 1

## 2018-04-18 MED ORDER — ACETAMINOPHEN 325 MG PO TABS: 650.0000 mg | ORAL_TABLET | Freq: Four times a day (QID) | ORAL | Status: DC | PRN

## 2018-04-18 MED ORDER — INSULIN DETEMIR 100 UNIT/ML ~~LOC~~ SOLN
5.0000 [IU] | Freq: Every day | SUBCUTANEOUS | Status: DC
  Filled 2018-04-18 (×2): qty 0.05

## 2018-04-18 MED ORDER — CHLORHEXIDINE GLUCONATE CLOTH 2 % EX PADS: 6.0000 | MEDICATED_PAD | Freq: Every day | CUTANEOUS | Status: DC

## 2018-04-18 MED ORDER — CARVEDILOL 3.125 MG PO TABS
3.1250 mg | ORAL_TABLET | Freq: Two times a day (BID) | ORAL | Status: DC
  Administered 2018-04-19: 3.125 mg via ORAL
  Filled 2018-04-18 (×2): qty 1

## 2018-04-18 NOTE — ED Notes (Signed)
Admitting MD at bedside.

## 2018-04-18 NOTE — ED Provider Notes (Signed)
[facility] EMERGENCY DEPARTMENT Provider Note   CSN: [id number] Arrival date & time: 04/18/18  1343     History   Chief Complaint Chief Complaint  Patient presents with  . Abnormal Lab    HPI Lance [name] is a 77 y.o. male who presents the emergency department with weakness. He has past medical history of CHF, end-stage renal disease on hemodialysis Tuesday Thursday Saturday with last dialysis 2 days ago, chronic decubitus ulcers of the ischium trochanter and bilateral feet.  He presents the emergency department with weakness. The patient states that today he was feeling extremely tired and weak. He was concerned that his hgb was very low. He normally runs at about 7.5 and has required transfusion in the past. The patient called his dialysis center to find out the results of his labs last week and found out that his hgb had dropped a gram. EMS reported fever, tachycardia and foul smelling urine, however he is a febrile for [place] at triage. The patient has a wound care nurse who felt that his sacral wound was looking worse and needed attention and had scheduled a follow up  Appointment at the wound care clinic this coming Wednesday.   HPI  Past Medical History:  Diagnosis Date  . Acute CHF (congestive heart failure) ([place]) 02/14/2016  . Acute osteomyelitis, pelvis, right ([place])   . Acute renal failure ([place])   . Anemia in chronic renal disease   . Blood transfusion   . Cardiomyopathy ([place]) 02/17/2016  . Chronic spastic paraplegia    secondary to MS  . Decreased sensation of lower extremity    due to MS  . Decubitus ulcer of ischium   . Decubitus ulcer of trochanter, stage 4 ([place]) 06/14/2015  . ESRD (end stage renal disease) ([place])    [place]  . History of kidney stones    x2  . History of MRSA infection    urine  . History of recurrent UTIs   . Hypertension   . MS (multiple sclerosis) ([place])   . Multiple sclerosis ([place])    stated by  client in triage   . Neuralgia neuritis, sciatic nerve    MS for 30 years  . Neurogenic bladder   . PONV (postoperative nausea and vomiting)   . Presence of indwelling urinary catheter   . Self-catheterizes urinary bladder   . Type 2 diabetes mellitus [facility])     Patient Active Problem List   Diagnosis Date Noted  . Symptomatic anemia 03/05/2018  . Goals of care, counseling/discussion   . Palliative care encounter   . ESRD on hemodialysis ([place]) 09/05/2017  . Thrombocytopenia ([place]) 09/05/2017  . Pressure injury of skin 09/04/2017  . Moderate protein-calorie malnutrition ([place]) 03/19/2017  . Sepsis secondary to UTI ([place]) 03/18/2017  . Sepsis ([place]) 03/18/2017  . Partial nontraumatic amputation of foot, left ([place]) 01/27/2017  . Malnutrition of moderate degree 07/03/2016  . Macrocytic anemia 07/02/2016  . SIRS (systemic inflammatory response syndrome) ([place]) 05/09/2016  . [place]ena 05/09/2016  . Hematemesis 05/09/2016  . Non-intractable vomiting with nausea 05/07/2016  . Altered mental status 04/21/2016  . Acute respiratory failure ([place])   . ESRD (end stage renal disease) ([place])   . NICM (nonischemic cardiomyopathy) ([place])   . Impaired functional mobility, balance, and endurance 02/17/2016  . Generalized anxiety disorder 02/17/2016  . Cardiomyopathy ([place]) 02/17/2016  . Hypoxia   . Acute pulmonary edema ([facility])   . Elevated troponin 02/14/2016  . Acute  systolic heart failure ([place]) 02/14/2016  . Depression 02/13/2016  . DM (diabetes mellitus) type II controlled with renal manifestation ([place]) 09/06/2015  . Dyslipidemia associated with type 2 diabetes mellitus ([place]) 09/06/2015  . Recurrent UTI (urinary tract infection) 09/06/2015  . Decubitus ulcer of right ischial area 06/14/2015  . Decubitus ulcer of trochanter, stage 4 ([place]) 06/14/2015  . Left perineal ischial pressure ulcer   . Acute osteomyelitis, pelvis, right ([place])   . Complicated urinary tract infection   . Essential hypertension,  benign 08/03/2013  . Spastic quadriplegia ([place]) 07/10/2011  . Multiple sclerosis ([place]) 03/30/2011  . Sacral decubitus ulcer 03/30/2011  . Progressive anemia 03/30/2011    Past Surgical History:  Procedure Laterality Date  . AMPUTATION Left 01/22/2017   Procedure: Left 5th Ray Amputation;  Surgeon: Newt Minion, MD;  Location: [facility];  Service: Orthopedics;  Laterality: Left;  . APPLICATION OF A-CELL OF EXTREMITY Left 09/05/2014   Procedure: PLACEMENT OF ACELL AND VAC;  Surgeon: Theodoro Kos, DO;  Location: [facility];  Service: Plastics;  Laterality: Left;  . BASCILIC VEIN TRANSPOSITION Right 03/24/2016   Procedure: RADIAL- CEPHALIC FISTULA CREATION RIGHT ARM;  Surgeon: Conrad [place], MD;  Location: [facility];  Service: Vascular;  Laterality: Right;  . Buttocks flap Left   . COLONOSCOPY    . EXTRACORPOREAL SHOCK WAVE LITHOTRIPSY  03/23/2011   x2  . FINGER SURGERY  2004  . hip flap Right   . INCISION AND DRAINAGE OF WOUND Left 09/05/2014   Procedure: IRRIGATION AND DEBRIDEMENT OF LEFT ISCHIUM WOUND WITH ;  Surgeon: Theodoro Kos, DO;  Location: [facility];  Service: Plastics;  Laterality: Left;  . INSERTION OF DIALYSIS CATHETER N/A 02/27/2016   Procedure: INSERTION OF DIALYSIS CATHETER-RIGHT INTERNAL JUGULA PLACEMENT;  Surgeon: Conrad [place], MD;  Location: [facility];  Service: Vascular;  Laterality: N/A;  . TONSILLECTOMY    . WOUND DEBRIDEMENT     10/2/17[facility]         Home Medications    Prior to Admission medications   Medication Sig Start Date End Date Taking? Authorizing Provider  acetaminophen (TYLENOL) 325 MG tablet Take 2 tablets (650 mg total) by mouth every 6 (six) hours as needed for mild pain (or Fever >/= 101). 09/09/17  Yes Emokpae, Courage, MD  baclofen (LIORESAL) 10 MG tablet Take 1 tablet (10 mg total) by mouth at bedtime as needed for muscle spasms. Patient taking differently: Take 10 mg by mouth daily.  07/15/16  Yes Lauree Chandler, NP  carvedilol (COREG) 3.125 MG tablet Take 1  tablet (3.125 mg total) by mouth 2 (two) times daily with a meal. 07/15/16  Yes Eubanks, Carlos American, NP  insulin aspart (NOVOLOG FLEXPEN) 100 UNIT/ML FlexPen Inject 0-9 Units into the skin 3 (three) times daily with meals. Per sliding scale Patient taking differently: Inject 7 Units 3 (three) times daily with meals into the skin. Per sliding scale 07/20/16  Yes Lauree Chandler, NP  insulin detemir (LEVEMIR) 100 UNIT/ML injection Inject 0.05 mLs (5 Units total) into the skin daily with breakfast. 03/07/18  Yes Ghimire, Henreitta Leber, MD  lidocaine-prilocaine (EMLA) cream Apply 1 application topically as needed. Patient taking differently: Apply 1 application topically as needed (for pain).  07/24/16  Yes Conrad [place], MD  Melatonin 3 MG TABS Take 3 mg by mouth at bedtime.   Yes [provider]  midodrine (PROAMATINE) 10 MG tablet Take 10 mg by mouth as needed. 01/26/18  Yes [provider]  SANTYL ointment Apply 1 application topically as needed. 01/13/18  Yes [provider]  simvastatin (ZOCOR) 20 MG tablet Take 20 mg by mouth every evening.   Yes [provider]  Skin Protectants, Misc. (EUCERIN) cream Apply 1 application daily as needed topically for dry skin. For feet    Yes [provider]  trimethoprim (TRIMPEX) 100 MG tablet Take 100 mg by mouth daily. 12/22/17  Yes [provider]  Insulin Pen Needle (PEN NEEDLES) 30G X 8 MM MISC Inject 1 each into the skin 4 (four) times daily. . 07/20/16   Lauree Chandler, NP    Family History Family History  Problem Relation Age of Onset  . Stroke Father   . Diabetes Father   . Diabetes Paternal Grandmother   . Stroke Paternal Grandfather   . Heart disease Neg Hx   . Cancer Neg Hx     Social History Social History   Tobacco Use  . Smoking status: Former Smoker    Types: Cigars    Last attempt to quit: 05/06/1983    Years since quitting: 34.9  . Smokeless tobacco: Never Used  Substance Use  Topics  . Alcohol use: No    Alcohol/week: 1.0 standard drinks    Types: 1 Glasses of wine per week    Comment: pt has not had any since Dec 2017  . Drug use: No     Allergies   Patient has no known allergies.   Review of Systems Review of Systems Ten systems reviewed and are negative for acute change, except as noted in the HPI.    Physical Exam Updated Vital Signs BP 140/66   Pulse 92   Temp 97.8 F (36.6 C) (Oral)   Resp (!) 22   SpO2 100%   Physical Exam Vitals signs and nursing note reviewed.  Constitutional:      General: He is not in acute distress.    Appearance: He is well-developed and underweight. He is ill-appearing (chronically). He is not diaphoretic.     Comments: Very pale  HENT:     Head: Normocephalic and atraumatic.     Mouth/Throat:     Mouth: Mucous membranes are dry.  Eyes:     General: No scleral icterus.    Conjunctiva/sclera: Conjunctivae normal.     Pupils: Pupils are equal, round, and reactive to light.  Neck:     Musculoskeletal: Normal range of motion and neck supple.  Cardiovascular:     Rate and Rhythm: Normal rate and regular rhythm.     Heart sounds: Normal heart sounds.  Pulmonary:     Effort: Pulmonary effort is normal. No respiratory distress.     Breath sounds: Normal breath sounds.  Abdominal:     Palpations: Abdomen is soft.     Tenderness: There is no abdominal tenderness.  Genitourinary:    Comments: Indwelling catheter in place Skin:    General: Skin is warm and dry.     Comments: 2  Stage 4 decubitus ulcers 1 over the R ischium and 1 over the Sacrum. Sacral wound is foul smelling with necrotic tissue centrally.   BL foot and toe ulerations  Neurological:     Mental Status: He is alert and oriented to person, place, and time.  Psychiatric:        Behavior: Behavior normal.      ED Treatments / Results  Labs (all labs ordered are listed, but only abnormal results are displayed) Labs Reviewed  COMPREHENSIVE  METABOLIC PANEL - Abnormal; Notable for the following components:      Result Value   Chloride 97 (*)    Glucose, Bld 64 (*)    BUN 31 (*)    Creatinine, Ser 3.21 (*)    Calcium 8.8 (*)    Albumin 1.9 (*)    GFR calc non Af Amer 18 (*)    GFR calc Af Amer 20 (*)    All other components within normal limits  CBC WITH DIFFERENTIAL/PLATELET - Abnormal; Notable for the following components:   RBC 2.21 (*)    Hemoglobin 6.5 (*)    HCT 22.8 (*)    MCV 103.2 (*)    MCHC 28.5 (*)    RDW 17.0 (*)    Neutro Abs 8.5 (*)    All other components within normal limits  URINALYSIS, ROUTINE W REFLEX MICROSCOPIC - Abnormal; Notable for the following components:   APPearance CLOUDY (*)    pH 9.0 (*)    Hgb urine dipstick SMALL (*)    Ketones, ur 5 (*)    Protein, ur 100 (*)    Leukocytes, UA LARGE (*)    WBC, UA >50 (*)    Bacteria, UA MANY (*)    All other components within normal limits  I-STAT CG4 LACTIC ACID, ED - Abnormal; Notable for the following components:   Lactic Acid, Venous 1.95 (*)    All other components within normal limits  I-STAT CHEM 8, ED - Abnormal; Notable for the following components:   Chloride 97 (*)    BUN 30 (*)    Creatinine, Ser 2.90 (*)    Glucose, Bld 60 (*)    Calcium, Ion 1.10 (*)    Hemoglobin 7.1 (*)    HCT 21.0 (*)    All other components within normal limits  CBG MONITORING, ED - Abnormal; Notable for the following components:   Glucose-Capillary 44 (*)    All other components within normal limits  CULTURE, BLOOD (ROUTINE X 2)  CULTURE, BLOOD (ROUTINE X 2)  I-STAT CG4 LACTIC ACID, ED  PREPARE RBC (CROSSMATCH)  TYPE AND SCREEN    EKG EKG Interpretation  Date/Time:  Monday April 18 2018 13:57:47 EST Ventricular Rate:  88 PR Interval:    QRS Duration: 106 QT Interval:  419 QTC Calculation: 507 R Axis:   60 Text Interpretation:  Sinus rhythm Prolonged QT interval No significant change since last tracing Confirmed by Deno Etienne [phone]) on  04/18/2018 3:20:55 PM   Radiology Dg Chest Port 1 View  Result Date: 04/18/2018 CLINICAL DATA:  Low hemoglobin and tachycardia. EXAM: PORTABLE CHEST 1 VIEW COMPARISON:  02/09/2017 FINDINGS: Stable cardiomegaly with mild aortic atherosclerosis. Blunting of the left lateral costophrenic angle with partial clearing of previously noted consolidation in the retrocardiac portion of the chest is noted. There is some persistent opacity and confluence along the periphery of the left lung base that may reflect an area of residual pneumonia. Attenuation of the pulmonary vasculature, upper lobe predominant consistent with emphysematous disease is identified. Trace fluid in the minor fissure is noted since prior. IMPRESSION: Cardiomegaly with partial clearing of left base consolidation/pneumonia since prior. Small residual left pleural effusion and/or pneumonia persists. Emphysematous changes of the lungs, upper lobe predominant. Electronically Signed   By: Ashley Royalty M.D.   On: 04/18/2018 15:33    Procedures .Critical Care Performed by: Margarita Mail, PA-C Authorized by: Margarita Mail, PA-C   Critical care provider statement:    Critical  care time (minutes):  45   Critical care was necessary to treat or prevent imminent or life-threatening deterioration of the following conditions:  Circulatory failure   Critical care was time spent personally by me on the following activities:  Discussions with consultants, evaluation of patient's response to treatment, examination of patient, ordering and performing treatments and interventions, ordering and review of laboratory studies, ordering and review of radiographic studies, pulse oximetry, re-evaluation of patient's condition, obtaining history from patient or surrogate and review of old charts   (including critical care time)  Medications Ordered in ED Medications  0.9 %  sodium chloride infusion (has no administration in time range)     Initial  Impression / Assessment and Plan / ED Course  I have reviewed the triage vital signs and the nursing notes.  Pertinent labs & imaging results that were available during my care of the patient were reviewed by me and considered in my medical decision making (see chart for details).  Clinical Course as of Apr 19 1623  Mon Apr 18, 2018  1539 Glucose(!): 60 [AH]  1619 I have asked the nurse to feed the patient. He will receive orange juice, crackers and peanut butter. Recheck blood sugar Q 30.  Glucose-Capillary(!!): 44 [AH]  1620 Hemoglobin(!!): 6.5 [AH]    Clinical Course User Index [AH] Margarita Mail, PA-C     Is a chronically ill male who presents for weakness.  His hemoglobin is 6.5 which is dropped about a gram patient's normal hemoglobin of 8.2-9.  We have ordered a blood transfusion.  Patient Will need admission.  I have given sign out to Dr. Glory Rosebush who will assume care for admission.  It appears infected however this is from a chronic indwelling catheter. Final Clinical Impressions(s) / ED Diagnoses   Final diagnoses:  None    ED Discharge Orders    None       Margarita Mail, PA-C 04/18/18 1624    Pattricia Boss, MD 05/03/18 [phone]

## 2018-04-18 NOTE — H&P (Signed)
History and Physical    Lance [name] JTT:[id number] [name]: 25-Nov-1940 DOA: 04/18/2018  PCP: Lavone Orn, MD  Patient coming from: Home  Chief Complaint: Abnormal lab  HPI: Lance [name] is a 77 y.o. male with medical history significant of hypertension, multiple sclerosis, chronic sacral decubitus wound, hyperlipidemia, chronic systolic and diastolic heart failure, diabetes mellitus, insulin dependent. Patient states that CBC checks at his HD sessions over the past week have been dropping from 7.8 to 7.5 down to 6.5 on Thursday. He was told about his hemoglobin of 6.5 today and called EMS for transport to the ED. He has been having associated weakness and fatigue. He has not taken anything to help with his symptoms and they have remained persistent.  ED Course: Vitals: Afebrile, normal pulse, slightly tachypnea, normotensive Labs: Hemoglobin of 6.5, MCV of 103.2, creatinine of 3.21, potassium of 4.1 Imaging: Chest x-ray with cardiomegaly with small residual left pleural effusion. Medications/Course: None  Review of Systems: Review of Systems  Constitutional: Positive for malaise/fatigue.  All other systems reviewed and are negative.   Past Medical History:  Diagnosis Date  . Acute CHF (congestive heart failure) ([place]) 02/14/2016  . Acute osteomyelitis, pelvis, right ([place])   . Acute renal failure ([place])   . Anemia in chronic renal disease   . Blood transfusion   . Cardiomyopathy ([place]) 02/17/2016  . Chronic spastic paraplegia    secondary to MS  . Decreased sensation of lower extremity    due to MS  . Decubitus ulcer of ischium   . Decubitus ulcer of trochanter, stage 4 ([place]) 06/14/2015  . ESRD (end stage renal disease) ([place])    [place]  . History of kidney stones    x2  . History of MRSA infection    urine  . History of recurrent UTIs   . Hypertension   . MS (multiple sclerosis) ([place])   . Multiple sclerosis ([place])    stated by client in  triage   . Neuralgia neuritis, sciatic nerve    MS for 30 years  . Neurogenic bladder   . PONV (postoperative nausea and vomiting)   . Presence of indwelling urinary catheter   . Self-catheterizes urinary bladder   . Type 2 diabetes mellitus ([place])     Past Surgical History:  Procedure Laterality Date  . AMPUTATION Left 01/22/2017   Procedure: Left 5th Ray Amputation;  Surgeon: Newt Minion, MD;  Location: [facility];  Service: Orthopedics;  Laterality: Left;  . APPLICATION OF A-CELL OF EXTREMITY Left 09/05/2014   Procedure: PLACEMENT OF ACELL AND VAC;  Surgeon: Theodoro Kos, DO;  Location: [facility];  Service: Plastics;  Laterality: Left;  . BASCILIC VEIN TRANSPOSITION Right 03/24/2016   Procedure: RADIAL- CEPHALIC FISTULA CREATION RIGHT ARM;  Surgeon: Conrad [place], MD;  Location: [facility];  Service: Vascular;  Laterality: Right;  . Buttocks flap Left   . COLONOSCOPY    . EXTRACORPOREAL SHOCK WAVE LITHOTRIPSY  03/23/2011   x2  . FINGER SURGERY  2004  . hip flap Right   . INCISION AND DRAINAGE OF WOUND Left 09/05/2014   Procedure: IRRIGATION AND DEBRIDEMENT OF LEFT ISCHIUM WOUND WITH ;  Surgeon: Theodoro Kos, DO;  Location: [facility];  Service: Plastics;  Laterality: Left;  . INSERTION OF DIALYSIS CATHETER N/A 02/27/2016   Procedure: INSERTION OF DIALYSIS CATHETER-RIGHT INTERNAL JUGULA PLACEMENT;  Surgeon: Conrad [place], MD;  Location: [facility];  Service: Vascular;  Laterality: N/A;  . TONSILLECTOMY    .  WOUND DEBRIDEMENT     [facility]      reports that he quit smoking about 34 years ago. His smoking use included cigars. He has never used smokeless tobacco. He reports that he does not drink alcohol or use drugs.  No Known Allergies  Family History  Problem Relation Age of Onset  . Stroke Father   . Diabetes Father   . Diabetes Paternal Grandmother   . Stroke Paternal Grandfather   . Heart disease Neg Hx   . Cancer Neg Hx     Prior to Admission medications   Medication Sig Start Date End  Date Taking? Authorizing Provider  acetaminophen (TYLENOL) 325 MG tablet Take 2 tablets (650 mg total) by mouth every 6 (six) hours as needed for mild pain (or Fever >/= 101). 09/09/17  Yes Emokpae, Courage, MD  baclofen (LIORESAL) 10 MG tablet Take 1 tablet (10 mg total) by mouth at bedtime as needed for muscle spasms. Patient taking differently: Take 10 mg by mouth daily.  07/15/16  Yes Lauree Chandler, NP  carvedilol (COREG) 3.125 MG tablet Take 1 tablet (3.125 mg total) by mouth 2 (two) times daily with a meal. 07/15/16  Yes Eubanks, Carlos American, NP  insulin aspart (NOVOLOG FLEXPEN) 100 UNIT/ML FlexPen Inject 0-9 Units into the skin 3 (three) times daily with meals. Per sliding scale Patient taking differently: Inject 7 Units 3 (three) times daily with meals into the skin. Per sliding scale 07/20/16  Yes Lauree Chandler, NP  insulin detemir (LEVEMIR) 100 UNIT/ML injection Inject 0.05 mLs (5 Units total) into the skin daily with breakfast. 03/07/18  Yes Ghimire, Henreitta Leber, MD  lidocaine-prilocaine (EMLA) cream Apply 1 application topically as needed. Patient taking differently: Apply 1 application topically as needed (for pain).  07/24/16  Yes Conrad [place], MD  Melatonin 3 MG TABS Take 3 mg by mouth at bedtime.   Yes [provider]  midodrine (PROAMATINE) 10 MG tablet Take 10 mg by mouth as needed. 01/26/18  Yes [provider]  SANTYL ointment Apply 1 application topically as needed. 01/13/18  Yes [provider]  simvastatin (ZOCOR) 20 MG tablet Take 20 mg by mouth every evening.   Yes [provider]  Skin Protectants, Misc. (EUCERIN) cream Apply 1 application daily as needed topically for dry skin. For feet    Yes [provider]  trimethoprim (TRIMPEX) 100 MG tablet Take 100 mg by mouth daily. 12/22/17  Yes [provider]  Insulin Pen Needle (PEN NEEDLES) 30G X 8 MM MISC Inject 1 each into the skin 4 (four) times daily. . 07/20/16   Lauree Chandler, NP    Physical Exam:  Physical Exam Constitutional:      General: He is not in acute distress.    Appearance: He is well-developed. He is not diaphoretic.  Eyes:     Conjunctiva/sclera: Conjunctivae normal.     Pupils: Pupils are equal, round, and reactive to light.  Neck:     Musculoskeletal: Normal range of motion.  Cardiovascular:     Rate and Rhythm: Normal rate and regular rhythm.     Heart sounds: Murmur present. Systolic murmur present with a grade of 2/6.  Pulmonary:     Effort: Pulmonary effort is normal. No respiratory distress.     Breath sounds: Normal breath sounds. No wheezing or rales.  Abdominal:     General: Bowel sounds are normal. There is no distension.     Palpations: Abdomen is  soft.     Tenderness: There is no abdominal tenderness. There is no guarding or rebound.  Musculoskeletal: Normal range of motion.        General: No tenderness.     Right lower leg: Edema present.     Left lower leg: Edema present.  Lymphadenopathy:     Cervical: No cervical adenopathy.  Skin:    General: Skin is warm and dry.  Neurological:     Mental Status: He is alert and oriented to person, place, and time.      Labs on Admission: I have personally reviewed following labs and imaging studies  CBC: Recent Labs  Lab 04/18/18 1507 04/18/18 1516  WBC 10.5  --   NEUTROABS 8.5*  --   HGB 6.5* 7.1*  HCT 22.8* 21.0*  MCV 103.2*  --   PLT 254  --     Basic Metabolic Panel: Recent Labs  Lab 04/18/18 1507 04/18/18 1516  NA 138 136  K 4.1 4.0  CL 97* 97*  CO2 29  --   GLUCOSE 64* 60*  BUN 31* 30*  CREATININE 3.21* 2.90*  CALCIUM 8.8*  --     GFR: CrCl cannot be calculated (Unknown ideal weight.).  Liver Function Tests: Recent Labs  Lab 04/18/18 1507  AST 25  ALT 11  ALKPHOS 64  BILITOT 0.8  PROT 7.6  ALBUMIN 1.9*   No results for input(s): LIPASE, AMYLASE in the last 168 hours. No results for input(s): AMMONIA in the last 168  hours.  Coagulation Profile: No results for input(s): INR, PROTIME in the last 168 hours.  Cardiac Enzymes: No results for input(s): CKTOTAL, CKMB, CKMBINDEX, TROPONINI in the last 168 hours.  BNP (last 3 results) No results for input(s): PROBNP in the last 8760 hours.  HbA1C: No results for input(s): HGBA1C in the last 72 hours.  CBG: Recent Labs  Lab 04/18/18 1545 04/18/18 1633  GLUCAP 44* 85    Lipid Profile: No results for input(s): CHOL, HDL, LDLCALC, TRIG, CHOLHDL, LDLDIRECT in the last 72 hours.  Thyroid Function Tests: No results for input(s): TSH, T4TOTAL, FREET4, T3FREE, THYROIDAB in the last 72 hours.  Anemia Panel: No results for input(s): VITAMINB12, FOLATE, FERRITIN, TIBC, IRON, RETICCTPCT in the last 72 hours.  Urine analysis:    Component Value Date/Time   COLORURINE YELLOW 04/18/2018 1507   APPEARANCEUR CLOUDY (A) 04/18/2018 1507   LABSPEC 1.011 04/18/2018 1507   PHURINE 9.0 (H) 04/18/2018 1507   GLUCOSEU NEGATIVE 04/18/2018 1507   HGBUR SMALL (A) 04/18/2018 1507   BILIRUBINUR NEGATIVE 04/18/2018 1507   KETONESUR 5 (A) 04/18/2018 1507   PROTEINUR 100 (A) 04/18/2018 1507   UROBILINOGEN 0.2 08/30/2014 2203   NITRITE NEGATIVE 04/18/2018 1507   LEUKOCYTESUR LARGE (A) 04/18/2018 1507     Radiological Exams on Admission: Dg Chest Port 1 View  Result Date: 04/18/2018 CLINICAL DATA:  Low hemoglobin and tachycardia. EXAM: PORTABLE CHEST 1 VIEW COMPARISON:  02/09/2017 FINDINGS: Stable cardiomegaly with mild aortic atherosclerosis. Blunting of the left lateral costophrenic angle with partial clearing of previously noted consolidation in the retrocardiac portion of the chest is noted. There is some persistent opacity and confluence along the periphery of the left lung base that may reflect an area of residual pneumonia. Attenuation of the pulmonary vasculature, upper lobe predominant consistent with emphysematous disease is identified. Trace fluid in the  minor fissure is noted since prior. IMPRESSION: Cardiomegaly with partial clearing of left base consolidation/pneumonia since prior. Small residual left  pleural effusion and/or pneumonia persists. Emphysematous changes of the lungs, upper lobe predominant. Electronically Signed   By: Ashley Royalty M.D.   On: 04/18/2018 15:33    EKG: Independently reviewed. Normal sinus rhythm. Slightly prolonged QTc of 507 ms  Assessment/Plan Active Problems:   Symptomatic anemia   Symptomatic anemia Chronic anemia Secondary to chronic sacral wound. Patient requires intermittent transfusions. Symptomatic with fatigue. No melena/hematochezia. -Transfuse 2 units, first unit now, second unit with HD -CBC post transfusion -FOBT pending  Chronic sacral wound Stage IV, POA. Patient follows with outpatient wound care. -Wound care not consulted as patient follows outpatient chronically. If cannot be discharged in AM, would recommend wound care consult. Will likely need resumption of [facility] services on discharge.  ESRD HD TTS -Nephrology consulted for HD tomorrow  Diabetes mellitus, insulin dependent -Continue Levemir 5 units -SSI  Essential hypertension -Continue Coreg 3.125 mg BID  Chronic combined systolic and diastolic heart failure Last Transthoracic Echocardiogram from 2017 significant for an EF of 35-40% with grade 1 diastolic dysfunction.  Chronic foley Neurogenic bladder Patient with chronically infected appearing urine. No symptoms. -Continue foley -Continue trimethoprim (outpatient prophylaxis)  Hyperlipidemia -Continue simvastatin  Multiple sclerosis Bed bound -Continue Baclofen  Prolonged QTc Mild. -Cautious with QTc prolonging medications   DVT prophylaxis: SCDs Code Status: DNR Family Communication: None at bedside Disposition Plan: Discharge tomorrow after blood transfusion and HD Consults called: Nephrology for HD Admission status: Observation   Cordelia Poche, MD [facility] 04/18/2018, 6:25 PM  If 7PM-7AM, please contact night-coverage [email or web address] Password [email or web address]

## 2018-04-18 NOTE — Progress Notes (Signed)
[facility] Progress Note  Lance [name] MRN: [id number]  [name]: Sep 18, 1940  Chief Complaint:  Low blood counts  Subjective:  Mr. Preast is a pleasant gentleman with ESRD on HD TTS at [facility] via [place] [facility] followed by Dr. Lorrene Reid who presented to the hospital for transfusion.  He has had ongoing issues with symptomatic anemia.  He felt exhausted earlier today.  He reports that he spoke with the dialysis unit today and was told that his Hb was 6.5 and they set up transfusion here at [facility] through the ER.  He is being admitted under obs status.  He has had previous obs stays with transfusions in the past and has tolerated getting 1 unit of blood then getting a second the next day with dialysis.  He has received part of the unit of blood tonight already and feels a little better.  They are not sure why his hemoglobin is dropping.  He had a colonoscopy several years ago and does not recall acute findings.  He denies any dark stools or blood in his stool.  He reports that the HD unit has recommended that he see GI but he hasn't been evaluated yet.  He does have nausea in the evenings.  Review of systems:  Denies any shortness of breath or cough Denies chest pain; does have mild edema No nausea or vomiting right now  Chronic foley +   Intake/Output Summary (Last 24 hours) at 04/18/2018 2000 Last data filed at 04/18/2018 1851 Gross per 24 hour  Intake 315 ml  Output -  Net 315 ml    Vitals:  Vitals:   04/18/18 1851 04/18/18 1900 04/18/18 1908 04/18/18 1930  BP: 124/60 (!) 120/59 (!) 120/57 122/60  Pulse: 94 90 92 91  Resp: (!) 22 17 (!) 22 (!) 27  Temp: 98.5 F (36.9 C)  98.7 F (37.1 C)   TempSrc: Oral  Oral   SpO2: 99% 98% 97% 97%     Physical Exam:  Gen Adult male in stretcher in NAD  HEENT [place]/AT; EOMI; sclera anicteric Neck supple; no JVD Resp CTAB; respirations unlabored Heart RRR; no edema  Abd Soft/NT/ND; normal bowel sounds   Neuro alert and oriented x 3; conversant; reclining in bed  Skin no rash on extremities exposed Psych: normal mood and affect  vasc - RUE AVF with bruit and thrill  Chronic foley in place    Labs:  BMP Latest Ref Rng & Units 04/18/2018 04/18/2018 03/05/2018  Glucose 70 - 99 mg/dL 60(L) 64(L) 133(H)  BUN 8 - 23 mg/dL 30(H) 31(H) 12  Creatinine 0.61 - 1.24 mg/dL 2.90(H) 3.21(H) 1.44(H)  Sodium 135 - 145 mmol/L 136 138 134(L)  Potassium 3.5 - 5.1 mmol/L 4.0 4.1 3.3(L)  Chloride 98 - 111 mmol/L 97(L) 97(L) 99  CO2 22 - 32 mmol/L - 29 29  Calcium 8.9 - 10.3 mg/dL - 8.8(L) 7.8(L)   CBC    Component Value Date/Time   WBC 10.5 04/18/2018 1507   RBC 2.21 (L) 04/18/2018 1507   HGB 7.1 (L) 04/18/2018 1516   HCT 21.0 (L) 04/18/2018 1516   HCT 31.5 (L) 09/05/2017 0301   PLT 254 04/18/2018 1507   MCV 103.2 (H) 04/18/2018 1507   MCH 29.4 04/18/2018 1507   MCHC 28.5 (L) 04/18/2018 1507   RDW 17.0 (H) 04/18/2018 1507   LYMPHSABS 1.1 04/18/2018 1507   MONOABS 0.7 04/18/2018 1507   EOSABS 0.2 04/18/2018 1507   BASOSABS 0.0 04/18/2018 1507  Assessment/Plan:  # ESRD on HD - Plan for HD per usual TTS schedule.  Goal for 1st shift.  Spoke with HD unit RN; orders placed  # Anemia  - Secondary in part to ESRD - s/p 1 unit PRBC's today and plan for 1 unit of PRBC's with HD tomorrow - GI evaluation has been recommended - Continue ESA with outpatient HD   Claudia Desanctis, MD 04/18/18 8:00 PM

## 2018-04-18 NOTE — ED Triage Notes (Signed)
[facility]- pt coming from home, per dialysis facility he had low hemoglobin. Pt noted to be tachycardic. Hx of MS. Pt also reports to have sores on his back.

## 2018-04-18 NOTE — ED Provider Notes (Signed)
Care inherited at 89 from offgoing team.   29yoM hx of MS, ESRD last dialyzed 2d ago, very weak today. Hx anemia of chronic dz requiring transfusions in past. Baseline Hb 7.5. Chronic ulcerations to sacrum and bilateral feet. Glucose 44 upon arrival.   Pt reevaluated. No distress. Will transfuse 1U prbcs for symptomatic anemia. Pt consented. Admitted to hospitalist who evaluated pt in ED. Stable time of admission.   Case and plan of care discussed with Dr. Tyrone Nine.    Corrie Dandy, MD 04/18/18 [place], [place], DO 04/18/18 [phone]

## 2018-04-18 NOTE — ED Notes (Signed)
2 units of blood ready, per Beverlee Nims in [place]. Katie RN made aware at 1726.

## 2018-04-19 DIAGNOSIS — N186 End stage renal disease: Secondary | ICD-10-CM | POA: Diagnosis not present

## 2018-04-19 DIAGNOSIS — Z7401 Bed confinement status: Secondary | ICD-10-CM | POA: Diagnosis not present

## 2018-04-19 DIAGNOSIS — M2550 Pain in unspecified joint: Secondary | ICD-10-CM | POA: Diagnosis not present

## 2018-04-19 DIAGNOSIS — D631 Anemia in chronic kidney disease: Secondary | ICD-10-CM | POA: Diagnosis not present

## 2018-04-19 DIAGNOSIS — Z992 Dependence on renal dialysis: Secondary | ICD-10-CM | POA: Diagnosis not present

## 2018-04-19 DIAGNOSIS — D649 Anemia, unspecified: Secondary | ICD-10-CM | POA: Diagnosis not present

## 2018-04-19 DIAGNOSIS — I120 Hypertensive chronic kidney disease with stage 5 chronic kidney disease or end stage renal disease: Secondary | ICD-10-CM | POA: Diagnosis not present

## 2018-04-19 DIAGNOSIS — N2581 Secondary hyperparathyroidism of renal origin: Secondary | ICD-10-CM | POA: Diagnosis not present

## 2018-04-19 LAB — BASIC METABOLIC PANEL
Anion gap: 13 (ref 5–15)
BUN: 34 mg/dL — ABNORMAL HIGH (ref 8–23)
CO2: 27 mmol/L (ref 22–32)
Calcium: 8.4 mg/dL — ABNORMAL LOW (ref 8.9–10.3)
Chloride: 95 mmol/L — ABNORMAL LOW (ref 98–111)
Creatinine, Ser: 3.32 mg/dL — ABNORMAL HIGH (ref 0.61–1.24)
GFR calc Af Amer: 20 mL/min — ABNORMAL LOW (ref >60)
GFR calc non Af Amer: 17 mL/min — ABNORMAL LOW (ref >60)
Glucose, Bld: 115 mg/dL — ABNORMAL HIGH (ref 70–99)
Potassium: 4.1 mmol/L (ref 3.5–5.1)
Sodium: 135 mmol/L (ref 135–145)

## 2018-04-19 LAB — CBC
HCT: 23.4 % — ABNORMAL LOW (ref 39.0–52.0)
HCT: 26.1 % — ABNORMAL LOW (ref 39.0–52.0)
Hemoglobin: 7.1 g/dL — ABNORMAL LOW (ref 13.0–17.0)
Hemoglobin: 7.9 g/dL — ABNORMAL LOW (ref 13.0–17.0)
MCH: 29.7 pg (ref 26.0–34.0)
MCH: 29.7 pg (ref 26.0–34.0)
MCHC: 30.3 g/dL (ref 30.0–36.0)
MCHC: 30.3 g/dL (ref 30.0–36.0)
MCV: 97.9 fL (ref 80.0–100.0)
MCV: 98.1 fL (ref 80.0–100.0)
NRBC: 0 % (ref 0.0–0.2)
Platelets: 240 10*3/uL (ref 150–400)
Platelets: 240 10*3/uL (ref 150–400)
RBC: 2.39 MIL/uL — ABNORMAL LOW (ref 4.22–5.81)
RBC: 2.66 MIL/uL — ABNORMAL LOW (ref 4.22–5.81)
RDW: 18.3 % — AB (ref 11.5–15.5)
RDW: 17.4 % — ABNORMAL HIGH (ref 11.5–15.5)
WBC: 10.3 10*3/uL (ref 4.0–10.5)
WBC: 10.7 10*3/uL — ABNORMAL HIGH (ref 4.0–10.5)
nRBC: 0 % (ref 0.0–0.2)

## 2018-04-19 LAB — MRSA PCR SCREENING: MRSA by PCR: NEGATIVE

## 2018-04-19 LAB — GLUCOSE, CAPILLARY
Glucose-Capillary: 102 mg/dL — ABNORMAL HIGH (ref 70–99)
Glucose-Capillary: 117 mg/dL — ABNORMAL HIGH (ref 70–99)
Glucose-Capillary: 186 mg/dL — ABNORMAL HIGH (ref 70–99)
Glucose-Capillary: 217 mg/dL — ABNORMAL HIGH (ref 70–99)

## 2018-04-19 MED ORDER — DAKINS (1/4 STRENGTH) 0.125 % EX SOLN
Freq: Every day | CUTANEOUS | Status: DC
  Filled 2018-04-19: qty 473

## 2018-04-19 NOTE — Progress Notes (Addendum)
[facility] Progress Note   Subjective: Seen in HD unit. Got 1 unit prbc yesterday and another today with HD. Denies CP/SOB/V/D/melena.  Would prefer to go home today if possible.   Objective Vitals:   04/19/18 1015 04/19/18 1030 04/19/18 1045 04/19/18 1100  BP: (!) 119/56 126/65 (!) 131/58   Pulse: 78 80 78   Resp: 16 16 16    Temp: (!) 97.5 F (36.4 C) (!) 97.5 F (36.4 C) 97.9 F (36.6 C) (P) 97.9 F (36.6 C)  TempSrc: Oral  Oral (P) Oral  SpO2:      Weight:       Physical Exam General: Frail chronically ill appearing male Heart: RRR Lungs: CTAB  Abdomen: bs+ ND/NT Extremities: No LE edema  Dialysis Access: RUE AVF cannulated on HD  Dialysis Orders:  [facility] TTS 3.5h 400/800 EDW 74.5kg 2K/2Ca Profile 2 No heparin  -Hectorol 54mcg IV qHD -Venofer 100mg  IV x 5 (1/5 dosed)  -Mircera 225 mcg IV q 2 weeks (last 12/12)  Assessment/Plan: 1. Symptomatic anemia. Requiring intermittent transfusions. S/p 2 units prbcs.FOBT negative. Rpt Hgb pending. Has outpatient GI w/u pending.  2. ESRD - TTS. HD today on schedule  3. HTN/volume - BP/volume stable. On midodrine as outpatient. UF goal 2L today.  4. Anemia- As above. Continue ESA/Fe as outpatient.  5. MBD - Ca ok. Cont VDRA Illene Regulus binder 6. Chronic sacral wound. Followed by wound care  Lynnda Child PA-C [facility] Pager [phone] 04/19/2018,11:28 AM  LOS: 0 days   Pt seen, examined and agree w A/P as above.  [name] MD [facility] pager [phone]   04/19/2018, 3:05 PM    Additional Objective Labs: Basic Metabolic Panel: Recent Labs  Lab 04/18/18 1507 04/18/18 1516 04/19/18 0353  NA 138 136 135  K 4.1 4.0 4.1  CL 97* 97* 95*  CO2 29  --  27  GLUCOSE 64* 60* 115*  BUN 31* 30* 34*  CREATININE 3.21* 2.90* 3.32*  CALCIUM 8.8*  --  8.4*   CBC: Recent Labs  Lab 04/18/18 1507 04/18/18 1516 04/19/18 0353  WBC 10.5  --  10.3  NEUTROABS 8.5*  --    --   HGB 6.5* 7.1* 7.1*  HCT 22.8* 21.0* 23.4*  MCV 103.2*  --  97.9  PLT 254  --  240   Blood Culture    Component Value Date/Time   SDES BLOOD LEFT HAND 04/18/2018 1552   SPECREQUEST  04/18/2018 1552    BOTTLES DRAWN AEROBIC AND ANAEROBIC Blood Culture adequate volume   CULT  04/18/2018 1552    NO GROWTH < 24 HOURS Performed at [facility], [place] [address] [place], [place] [address]    REPTSTATUS PENDING 04/18/2018 1552    Cardiac Enzymes: No results for input(s): CKTOTAL, CKMB, CKMBINDEX, TROPONINI in the last 168 hours. CBG: Recent Labs  Lab 04/18/18 1545 04/18/18 1633 04/18/18 2112 04/19/18 0013 04/19/18 0450  GLUCAP 44* 85 108* 102* 117*   Iron Studies: No results for input(s): IRON, TIBC, TRANSFERRIN, FERRITIN in the last 72 hours. Lab Results  Component Value Date   INR 1.21 02/10/2018   INR 1.07 09/04/2017   INR 1.18 03/18/2017   Medications: . sodium chloride Stopped (04/18/18 1951)   . sodium chloride   Intravenous Once  . baclofen  10 mg Oral Daily  . carvedilol  3.125 mg Oral BID WC  . Chlorhexidine Gluconate Cloth  6 each Topical Q0600  . insulin aspart  0-9 Units Subcutaneous TID WC  . insulin detemir  5 Units Subcutaneous Q breakfast  . Melatonin  3 mg Oral QHS  . simvastatin  20 mg Oral QPM  . trimethoprim  100 mg Oral Daily

## 2018-04-19 NOTE — Discharge Summary (Signed)
Physician Discharge Summary  Lance [name] BZJ:[id number] [name]: 1940-11-30 DOA: 04/18/2018  PCP: Lavone Orn, MD  Admit date: 04/18/2018 Discharge date: 04/19/2018  Admitted From: Home Disposition: Home  Recommendations for Outpatient Follow-up:  1. Follow up with PCP in 1 week 2. Wound care follow-up for sacral wounds 3. Please obtain CBC in one week 4. Please follow up on the following pending results: None  Home Health: RN, PT, aide Equipment/Devices: None  Discharge Condition: Stable CODE STATUS: DNR Diet recommendation: Heart healthy   Brief/Interim Summary:  Chief Complaint: Abnormal lab  HPI: Lance [name] is a 77 y.o. male with medical history significant of hypertension, multiple sclerosis, chronic sacral decubitus wound, hyperlipidemia, chronic systolic and diastolic heart failure, diabetes mellitus, insulin dependent. Patient states that CBC checks at his HD sessions over the past week have been dropping from 7.8 to 7.5 down to 6.5 on Thursday. He was told about his hemoglobin of 6.5 today and called EMS for transport to the ED. He has been having associated weakness and fatigue. He has not taken anything to help with his symptoms and they have remained persistent.  ED Course: Vitals: Afebrile, normal pulse, slightly tachypnea, normotensive Labs: Hemoglobin of 6.5, MCV of 103.2, creatinine of 3.21, potassium of 4.1 Imaging: Chest x-ray with cardiomegaly with small residual left pleural effusion. Medications/Course: None  Hospital course:  Symptomatic anemia Chronic anemia Secondary to chronic sacral wound. Patient requires intermittent transfusions. Symptomatic with fatigue. No melena/hematochezia. Fecal occult blood test negative. Transfused 2 units with improvement of hemoglobin from 6.5 to 7.9. Stable on discharge.  Chronic sacral wound Stage IV, POA. Patient follows with outpatient wound care. Wound care seen in hospital. Resume home  health RN and recommend continued follow-up at wound clinic.  ESRD HD TTS. HD prior to discharge  Diabetes mellitus, insulin dependent Continued Levemir.  Essential hypertension Continued Coreg 3.125 mg BID  Chronic combined systolic and diastolic heart failure Last Transthoracic Echocardiogram from 2017 significant for an EF of 35-40% with grade 1 diastolic dysfunction.  Chronic foley Neurogenic bladder Patient with chronically infected appearing urine. No symptoms. Continued trimethoprim (outpatient prophylaxis)  Hyperlipidemia Continued simvastatin  Multiple sclerosis Bed bound. Continued Baclofen  Prolonged QTc Mild. Cautious with QTc prolonging medications  Discharge Diagnoses:  Active Problems:   Symptomatic anemia    Discharge Instructions  Discharge Instructions    Call MD for:  extreme fatigue   Complete by:  As directed    Diet - low sodium heart healthy   Complete by:  As directed      Allergies as of 04/19/2018   No Known Allergies     Medication List    TAKE these medications   acetaminophen 325 MG tablet Commonly known as:  TYLENOL Take 2 tablets (650 mg total) by mouth every 6 (six) hours as needed for mild pain (or Fever >/= 101).   baclofen 10 MG tablet Commonly known as:  LIORESAL Take 1 tablet (10 mg total) by mouth at bedtime as needed for muscle spasms. What changed:  when to take this   carvedilol 3.125 MG tablet Commonly known as:  COREG Take 1 tablet (3.125 mg total) by mouth 2 (two) times daily with a meal.   eucerin cream Apply 1 application daily as needed topically for dry skin. For feet   insulin aspart 100 UNIT/ML FlexPen Commonly known as:  NOVOLOG FLEXPEN Inject 0-9 Units into the skin 3 (three) times daily with meals. Per sliding scale What changed:  how much to take   insulin detemir 100 UNIT/ML injection Commonly known as:  LEVEMIR Inject 0.05 mLs (5 Units total) into the skin daily with breakfast.     lidocaine-prilocaine cream Commonly known as:  EMLA Apply 1 application topically as needed. What changed:  reasons to take this   Melatonin 3 MG Tabs Take 3 mg by mouth at bedtime.   midodrine 10 MG tablet Commonly known as:  PROAMATINE Take 10 mg by mouth as needed.   Pen Needles 30G X 8 MM Misc Inject 1 each into the skin 4 (four) times daily. Marland Kitchen   SANTYL ointment Generic drug:  collagenase Apply 1 application topically as needed.   simvastatin 20 MG tablet Commonly known as:  ZOCOR Take 20 mg by mouth every evening.   trimethoprim 100 MG tablet Commonly known as:  TRIMPEX Take 100 mg by mouth daily.      Follow-up Information    [facility], [facility] Follow up.   Specialty:  Home Health Services Why:  Registered Nurse, Aide-office to call within 24 hours for a visit.  Contact information: [place] [address] [phone]        Lavone Orn, MD. Schedule an appointment as soon as possible for a visit in 1 week(s).   Specialty:  Internal Medicine Contact information: [address] [place] [address] [phone]          No Known Allergies  Consultations:  None   Procedures/Studies: Dg Chest Port 1 View  Result Date: 04/18/2018 CLINICAL DATA:  Low hemoglobin and tachycardia. EXAM: PORTABLE CHEST 1 VIEW COMPARISON:  02/09/2017 FINDINGS: Stable cardiomegaly with mild aortic atherosclerosis. Blunting of the left lateral costophrenic angle with partial clearing of previously noted consolidation in the retrocardiac portion of the chest is noted. There is some persistent opacity and confluence along the periphery of the left lung base that may reflect an area of residual pneumonia. Attenuation of the pulmonary vasculature, upper lobe predominant consistent with emphysematous disease is identified. Trace fluid in the minor fissure is noted since prior. IMPRESSION: Cardiomegaly with partial clearing of left base  consolidation/pneumonia since prior. Small residual left pleural effusion and/or pneumonia persists. Emphysematous changes of the lungs, upper lobe predominant. Electronically Signed   By: Ashley Royalty M.D.   On: 04/18/2018 15:33      Subjective: No issues today.  Discharge Exam: Vitals:   04/19/18 1130 04/19/18 1144  BP: 123/72 128/74  Pulse: 78 79  Resp: 16 16  Temp:  97.9 F (36.6 C)  SpO2:  96%   Vitals:   04/19/18 1100 04/19/18 1115 04/19/18 1130 04/19/18 1144  BP: 112/64 (!) 144/60 123/72 128/74  Pulse: 76 79 78 79  Resp: 16 18 16 16   Temp: 97.9 F (36.6 C) 97.9 F (36.6 C)  97.9 F (36.6 C)  TempSrc: Oral Oral  Oral  SpO2:  96%  96%  Weight:    72.7 kg    General: Pt is alert, awake, not in acute distress   The results of significant diagnostics from this hospitalization (including imaging, microbiology, ancillary and laboratory) are listed below for reference.     Microbiology: Recent Results (from the past 240 hour(s))  Blood Culture (routine x 2)     Status: None (Preliminary result)   Collection Time: 04/18/18  3:07 PM  Result Value Ref Range Status   Specimen Description BLOOD LEFT WRIST  Final   Special Requests   Final  BOTTLES DRAWN AEROBIC AND ANAEROBIC Blood Culture adequate volume   Culture   Final    NO GROWTH < 24 HOURS Performed at [facility], [place] [address] [place], [place] [address]    Report Status PENDING  Incomplete  Blood Culture (routine x 2)     Status: None (Preliminary result)   Collection Time: 04/18/18  3:52 PM  Result Value Ref Range Status   Specimen Description BLOOD LEFT HAND  Final   Special Requests   Final    BOTTLES DRAWN AEROBIC AND ANAEROBIC Blood Culture adequate volume   Culture   Final    NO GROWTH < 24 HOURS Performed at [facility], [place] [address] [place], [place] [address]    Report Status PENDING  Incomplete  MRSA PCR Screening     Status: None   Collection Time: 04/19/18 12:25 AM    Result Value Ref Range Status   MRSA by PCR NEGATIVE NEGATIVE Final    Comment:        The GeneXpert MRSA Assay (FDA approved for NASAL specimens only), is one component of a comprehensive MRSA colonization surveillance program. It is not intended to diagnose MRSA infection nor to guide or monitor treatment for MRSA infections. Performed at [facility], [place] [address] [place], [place] [address]      Labs: BNP (last 3 results) No results for input(s): BNP in the last 8760 hours. Basic Metabolic Panel: Recent Labs  Lab 04/18/18 1507 04/18/18 1516 04/19/18 0353  NA 138 136 135  K 4.1 4.0 4.1  CL 97* 97* 95*  CO2 29  --  27  GLUCOSE 64* 60* 115*  BUN 31* 30* 34*  CREATININE 3.21* 2.90* 3.32*  CALCIUM 8.8*  --  8.4*   Liver Function Tests: Recent Labs  Lab 04/18/18 1507  AST 25  ALT 11  ALKPHOS 64  BILITOT 0.8  PROT 7.6  ALBUMIN 1.9*   No results for input(s): LIPASE, AMYLASE in the last 168 hours. No results for input(s): AMMONIA in the last 168 hours. CBC: Recent Labs  Lab 04/18/18 1507 04/18/18 1516 04/19/18 0353 04/19/18 1227  WBC 10.5  --  10.3 10.7*  NEUTROABS 8.5*  --   --   --   HGB 6.5* 7.1* 7.1* 7.9*  HCT 22.8* 21.0* 23.4* 26.1*  MCV 103.2*  --  97.9 98.1  PLT 254  --  240 240   Cardiac Enzymes: No results for input(s): CKTOTAL, CKMB, CKMBINDEX, TROPONINI in the last 168 hours. BNP: Invalid input(s): POCBNP CBG: Recent Labs  Lab 04/18/18 2112 04/19/18 0013 04/19/18 0450 04/19/18 1415 04/19/18 1612  GLUCAP 108* 102* 117* 186* 217*   D-Dimer No results for input(s): DDIMER in the last 72 hours. Hgb A1c No results for input(s): HGBA1C in the last 72 hours. Lipid Profile No results for input(s): CHOL, HDL, LDLCALC, TRIG, CHOLHDL, LDLDIRECT in the last 72 hours. Thyroid function studies No results for input(s): TSH, T4TOTAL, T3FREE, THYROIDAB in the last 72 hours.  Invalid input(s): FREET3 Anemia work up No results for  input(s): VITAMINB12, FOLATE, FERRITIN, TIBC, IRON, RETICCTPCT in the last 72 hours. Urinalysis    Component Value Date/Time   COLORURINE YELLOW 04/18/2018 1507   APPEARANCEUR CLOUDY (A) 04/18/2018 1507   LABSPEC 1.011 04/18/2018 1507   PHURINE 9.0 (H) 04/18/2018 1507   GLUCOSEU NEGATIVE 04/18/2018 1507   HGBUR SMALL (A) 04/18/2018 1507   BILIRUBINUR NEGATIVE 04/18/2018 1507   KETONESUR 5 (A)  04/18/2018 1507   PROTEINUR 100 (A) 04/18/2018 1507   UROBILINOGEN 0.2 08/30/2014 2203   NITRITE NEGATIVE 04/18/2018 1507   LEUKOCYTESUR LARGE (A) 04/18/2018 1507   Sepsis Labs Invalid input(s): PROCALCITONIN,  WBC,  LACTICIDVEN Microbiology Recent Results (from the past 240 hour(s))  Blood Culture (routine x 2)     Status: None (Preliminary result)   Collection Time: 04/18/18  3:07 PM  Result Value Ref Range Status   Specimen Description BLOOD LEFT WRIST  Final   Special Requests   Final    BOTTLES DRAWN AEROBIC AND ANAEROBIC Blood Culture adequate volume   Culture   Final    NO GROWTH < 24 HOURS Performed at [facility], [place] [address], [place], [place] [address]    Report Status PENDING  Incomplete  Blood Culture (routine x 2)     Status: None (Preliminary result)   Collection Time: 04/18/18  3:52 PM  Result Value Ref Range Status   Specimen Description BLOOD LEFT HAND  Final   Special Requests   Final    BOTTLES DRAWN AEROBIC AND ANAEROBIC Blood Culture adequate volume   Culture   Final    NO GROWTH < 24 HOURS Performed at [facility], [place] [address] [place], [place] [address]    Report Status PENDING  Incomplete  MRSA PCR Screening     Status: None   Collection Time: 04/19/18 12:25 AM  Result Value Ref Range Status   MRSA by PCR NEGATIVE NEGATIVE Final    Comment:        The GeneXpert MRSA Assay (FDA approved for NASAL specimens only), is one component of a comprehensive MRSA colonization surveillance program. It is not intended to diagnose MRSA infection  nor to guide or monitor treatment for MRSA infections. Performed at [facility], [place] [address] [place], [place] [address]      SIGNED:   Cordelia Poche, MD [facility] 04/19/2018, 4:17 PM

## 2018-04-19 NOTE — Care Management Obs Status (Signed)
[place] NOTIFICATION   Patient Details  Name: Lance [name] MRN: [id number] Date of Birth: 17-Aug-1940   Medicare Observation Status Notification Given:  Yes    Bethena Roys, RN 04/19/2018, 3:13 PM

## 2018-04-19 NOTE — Progress Notes (Signed)
Pt received 1 unit of PRBCs; tolerated transfusion well; no s/s of a reaction noted

## 2018-04-19 NOTE — Discharge Instructions (Signed)
[place] were given blood overnight for your anemia. Your hemoglobin is 7.9 prior to discharge.

## 2018-04-19 NOTE — Care Management Note (Addendum)
Case Management Note  Patient Details  Name: Lance [name] MRN: [id number] Date of Birth: [birth year]  Subjective/Objective:  Pt presented for asymptomatic anemia. PTA from home with wife and son- both have attendants in the home. Pt was previously active with [facility] for [facility] RN services pt will need an additional aide ordered with [facility]. Secure Chat sent to physician to ask for new orders.                    Action/Plan: Pt will need [facility] for transport back to home address. Medical Necessity Form filled out. Patient has HD TTS and he uses [facility] for transportation. CM did reach out to [facility] with [place] and pt to be seen in am for Nursing. No further needs from CM at this time.   Expected Discharge Date:                  Expected Discharge Plan:  [place]  In-House Referral:  NA  Discharge planning Services  CM Consult  Post Acute Care Choice:  Home Health, Resumption of Svcs/PTA Provider Choice offered to:  Patient  DME Arranged:  N/A DME Agency:  NA  HH Arranged:  RN, Disease Management, Nurse's Aide [place] Agency:  [facility]  Status of Service:  Completed, signed off  If discussed at [place] of Stay Meetings, dates discussed:    Additional Comments: 1634 04-19-18 Jacqlyn Krauss, RN,BSN [phone] [facility] called for transport at 1630. Wait time will be about 2 hours per transport. Nursing staff is aware. No further needs from CM at this time.  Bethena Roys, RN 04/19/2018, 3:27 PM

## 2018-04-19 NOTE — Consult Note (Signed)
[place] Nurse wound consult note Reason for Consult: Chronic nonhealing wounds.  Has [facility] nurse that comes to his home. Would have liked to have initiated hydrotherapy to clean out the wound on left ischium but a patient indicates he is leaving today.  Please re-consult if that is not happening. Will begin Dakins for 3 days and this should go home with patient.   Unstageable pressure injury to left sacrum.  Has been using enzymatic debrider, Santyl.  Odor, purulence and necrotic tissue noted with adherent stringy tan slough.  Right ischium, unstageable pressure injury, tunneling, unable to visualize wound bed.  Has been packing with silver alginate.   Left lateral foot, neuropathic ulcer:  1 cm x 2.4 cm x 0.2 cm  Right plantar foot near great toe:  1 cm x 1 cm x 0.1 cm  Bilateral heels with nonblancahable erythema.  Has Prevalon offloading boots in place bilaterally.  Wound type:pressure Pressure Injury POA: Yes Measurement: LEft ischium:  4 cm x 3 cm unable to visualize depth Right ischium:  1 cm x 1 cm tunneling with depth of 2.4 cm  Wound bed:see above Drainage (amount, consistency, odor) Moderate purulence to left ischial wound Periwound:dry skin  Dressing procedure/placement/frequency:  Bedside RN to complete: Cleanse left ischium with NS.  Cover wound depth with DAkins moist gauze.  Cover with gauze and ABD pad/tape.  Change daily. Do this x 3 days.  Tues/Wed/Thurs.  Then reassess for need for ongoing santyl or begin Aquacel Ag to wound bed.  Right ischium:  Cleanse with NS.  Fill with Iodoform packing strip.  Cleanse feet with soap and water.  Apply silicone foam to nonintact wounds to feet.  Float heels with Prevalon boots.  Will not follow at this time.  Please re-consult if needed.  Domenic Moras MSN, RN, FNP-BC CWON Wound, Ostomy, Continence Nurse Pager [phone]

## 2018-04-19 NOTE — Progress Notes (Signed)
Inpatient Diabetes Program Recommendations  AACE/ADA: New Consensus Statement on Inpatient Glycemic Control (2015)  Target Ranges:  Prepandial:   less than 140 mg/dL      Peak postprandial:   less than 180 mg/dL (1-2 hours)      Critically ill patients:  140 - 180 mg/dL   Lab Results  Component Value Date   GLUCAP 117 (H) 04/19/2018   HGBA1C 6.1 04/10/2016    Review of Glycemic Control  Diabetes history: Type 2 DM Outpatient Diabetes medications:  Novolog 7 units tid with meals, Levemir 5 units with breakfast Current orders for Inpatient glycemic control:  Novolog sensitive tid with meals, Levemir 5 units daily  Inpatient Diabetes Program Recommendations:   Noted patient's blood glucose was 44 on admission and doing well without Levemir currently. Consider placing Levemir on hold and reevaluating in am. Secure chat sent to Dr. Lonny Prude with insulin recommendations.  Thank you, Nani Gasser. Aaryanna Hyden, RN, MSN, CDE  Diabetes Coordinator Inpatient Glycemic Control Team Team Pager [phone] (8am-5pm) 04/19/2018 11:17 AM

## 2018-04-20 DIAGNOSIS — L89314 Pressure ulcer of right buttock, stage 4: Secondary | ICD-10-CM | POA: Diagnosis not present

## 2018-04-20 DIAGNOSIS — L97408 Non-pressure chronic ulcer of unspecified heel and midfoot with other specified severity: Secondary | ICD-10-CM | POA: Diagnosis not present

## 2018-04-20 DIAGNOSIS — L89893 Pressure ulcer of other site, stage 3: Secondary | ICD-10-CM | POA: Diagnosis not present

## 2018-04-20 DIAGNOSIS — E11621 Type 2 diabetes mellitus with foot ulcer: Secondary | ICD-10-CM | POA: Diagnosis not present

## 2018-04-20 DIAGNOSIS — L89894 Pressure ulcer of other site, stage 4: Secondary | ICD-10-CM | POA: Diagnosis not present

## 2018-04-20 DIAGNOSIS — L89323 Pressure ulcer of left buttock, stage 3: Secondary | ICD-10-CM | POA: Diagnosis not present

## 2018-04-20 LAB — BPAM RBC
BLOOD PRODUCT EXPIRATION DATE: 202001022359
Blood Product Expiration Date: 201912232359
ISSUE DATE / TIME: 201912161828
ISSUE DATE / TIME: 201912171025
Unit Type and Rh: 600
Unit Type and Rh: 6200

## 2018-04-20 LAB — TYPE AND SCREEN
ABO/RH(D): A POS
Antibody Screen: NEGATIVE
Unit division: 0
Unit division: 0

## 2018-04-21 DIAGNOSIS — D631 Anemia in chronic kidney disease: Secondary | ICD-10-CM | POA: Diagnosis not present

## 2018-04-21 DIAGNOSIS — Z992 Dependence on renal dialysis: Secondary | ICD-10-CM | POA: Diagnosis not present

## 2018-04-21 DIAGNOSIS — N2581 Secondary hyperparathyroidism of renal origin: Secondary | ICD-10-CM | POA: Diagnosis not present

## 2018-04-21 DIAGNOSIS — E1122 Type 2 diabetes mellitus with diabetic chronic kidney disease: Secondary | ICD-10-CM | POA: Diagnosis not present

## 2018-04-21 DIAGNOSIS — D509 Iron deficiency anemia, unspecified: Secondary | ICD-10-CM | POA: Diagnosis not present

## 2018-04-21 DIAGNOSIS — N186 End stage renal disease: Secondary | ICD-10-CM | POA: Diagnosis not present

## 2018-04-22 DIAGNOSIS — D649 Anemia, unspecified: Secondary | ICD-10-CM | POA: Diagnosis not present

## 2018-04-22 DIAGNOSIS — L89893 Pressure ulcer of other site, stage 3: Secondary | ICD-10-CM | POA: Diagnosis not present

## 2018-04-22 DIAGNOSIS — E11621 Type 2 diabetes mellitus with foot ulcer: Secondary | ICD-10-CM | POA: Diagnosis not present

## 2018-04-22 DIAGNOSIS — L89894 Pressure ulcer of other site, stage 4: Secondary | ICD-10-CM | POA: Diagnosis not present

## 2018-04-22 DIAGNOSIS — L97408 Non-pressure chronic ulcer of unspecified heel and midfoot with other specified severity: Secondary | ICD-10-CM | POA: Diagnosis not present

## 2018-04-22 DIAGNOSIS — N186 End stage renal disease: Secondary | ICD-10-CM | POA: Diagnosis not present

## 2018-04-22 DIAGNOSIS — L89314 Pressure ulcer of right buttock, stage 4: Secondary | ICD-10-CM | POA: Diagnosis not present

## 2018-04-22 DIAGNOSIS — N185 Chronic kidney disease, stage 5: Secondary | ICD-10-CM | POA: Diagnosis not present

## 2018-04-22 DIAGNOSIS — L89323 Pressure ulcer of left buttock, stage 3: Secondary | ICD-10-CM | POA: Diagnosis not present

## 2018-04-23 DIAGNOSIS — N2581 Secondary hyperparathyroidism of renal origin: Secondary | ICD-10-CM | POA: Diagnosis not present

## 2018-04-23 DIAGNOSIS — D509 Iron deficiency anemia, unspecified: Secondary | ICD-10-CM | POA: Diagnosis not present

## 2018-04-23 DIAGNOSIS — E1122 Type 2 diabetes mellitus with diabetic chronic kidney disease: Secondary | ICD-10-CM | POA: Diagnosis not present

## 2018-04-23 DIAGNOSIS — D631 Anemia in chronic kidney disease: Secondary | ICD-10-CM | POA: Diagnosis not present

## 2018-04-23 DIAGNOSIS — N186 End stage renal disease: Secondary | ICD-10-CM | POA: Diagnosis not present

## 2018-04-23 DIAGNOSIS — Z992 Dependence on renal dialysis: Secondary | ICD-10-CM | POA: Diagnosis not present

## 2018-04-23 LAB — CULTURE, BLOOD (ROUTINE X 2)
Culture: NO GROWTH
Culture: NO GROWTH
SPECIAL REQUESTS: ADEQUATE
Special Requests: ADEQUATE

## 2018-04-25 DIAGNOSIS — D509 Iron deficiency anemia, unspecified: Secondary | ICD-10-CM | POA: Diagnosis not present

## 2018-04-25 DIAGNOSIS — L97408 Non-pressure chronic ulcer of unspecified heel and midfoot with other specified severity: Secondary | ICD-10-CM | POA: Diagnosis not present

## 2018-04-25 DIAGNOSIS — L89893 Pressure ulcer of other site, stage 3: Secondary | ICD-10-CM | POA: Diagnosis not present

## 2018-04-25 DIAGNOSIS — L89323 Pressure ulcer of left buttock, stage 3: Secondary | ICD-10-CM | POA: Diagnosis not present

## 2018-04-25 DIAGNOSIS — Z992 Dependence on renal dialysis: Secondary | ICD-10-CM | POA: Diagnosis not present

## 2018-04-25 DIAGNOSIS — N2581 Secondary hyperparathyroidism of renal origin: Secondary | ICD-10-CM | POA: Diagnosis not present

## 2018-04-25 DIAGNOSIS — E1122 Type 2 diabetes mellitus with diabetic chronic kidney disease: Secondary | ICD-10-CM | POA: Diagnosis not present

## 2018-04-25 DIAGNOSIS — L89894 Pressure ulcer of other site, stage 4: Secondary | ICD-10-CM | POA: Diagnosis not present

## 2018-04-25 DIAGNOSIS — D631 Anemia in chronic kidney disease: Secondary | ICD-10-CM | POA: Diagnosis not present

## 2018-04-25 DIAGNOSIS — E11621 Type 2 diabetes mellitus with foot ulcer: Secondary | ICD-10-CM | POA: Diagnosis not present

## 2018-04-25 DIAGNOSIS — L89314 Pressure ulcer of right buttock, stage 4: Secondary | ICD-10-CM | POA: Diagnosis not present

## 2018-04-25 DIAGNOSIS — N186 End stage renal disease: Secondary | ICD-10-CM | POA: Diagnosis not present

## 2018-04-27 DIAGNOSIS — E11621 Type 2 diabetes mellitus with foot ulcer: Secondary | ICD-10-CM | POA: Diagnosis not present

## 2018-04-27 DIAGNOSIS — L89324 Pressure ulcer of left buttock, stage 4: Secondary | ICD-10-CM | POA: Diagnosis not present

## 2018-04-27 DIAGNOSIS — L89154 Pressure ulcer of sacral region, stage 4: Secondary | ICD-10-CM | POA: Diagnosis not present

## 2018-04-27 DIAGNOSIS — L89894 Pressure ulcer of other site, stage 4: Secondary | ICD-10-CM | POA: Diagnosis not present

## 2018-04-27 DIAGNOSIS — L89893 Pressure ulcer of other site, stage 3: Secondary | ICD-10-CM | POA: Diagnosis not present

## 2018-04-27 DIAGNOSIS — I120 Hypertensive chronic kidney disease with stage 5 chronic kidney disease or end stage renal disease: Secondary | ICD-10-CM | POA: Diagnosis not present

## 2018-04-27 DIAGNOSIS — G35 Multiple sclerosis: Secondary | ICD-10-CM | POA: Diagnosis not present

## 2018-04-27 DIAGNOSIS — L89314 Pressure ulcer of right buttock, stage 4: Secondary | ICD-10-CM | POA: Diagnosis not present

## 2018-04-27 DIAGNOSIS — L97418 Non-pressure chronic ulcer of right heel and midfoot with other specified severity: Secondary | ICD-10-CM | POA: Diagnosis not present

## 2018-04-27 DIAGNOSIS — E1122 Type 2 diabetes mellitus with diabetic chronic kidney disease: Secondary | ICD-10-CM | POA: Diagnosis not present

## 2018-04-28 DIAGNOSIS — Z992 Dependence on renal dialysis: Secondary | ICD-10-CM | POA: Diagnosis not present

## 2018-04-28 DIAGNOSIS — N2581 Secondary hyperparathyroidism of renal origin: Secondary | ICD-10-CM | POA: Diagnosis not present

## 2018-04-28 DIAGNOSIS — E1122 Type 2 diabetes mellitus with diabetic chronic kidney disease: Secondary | ICD-10-CM | POA: Diagnosis not present

## 2018-04-28 DIAGNOSIS — D631 Anemia in chronic kidney disease: Secondary | ICD-10-CM | POA: Diagnosis not present

## 2018-04-28 DIAGNOSIS — N186 End stage renal disease: Secondary | ICD-10-CM | POA: Diagnosis not present

## 2018-04-28 DIAGNOSIS — D509 Iron deficiency anemia, unspecified: Secondary | ICD-10-CM | POA: Diagnosis not present

## 2018-04-30 DIAGNOSIS — L89893 Pressure ulcer of other site, stage 3: Secondary | ICD-10-CM | POA: Diagnosis not present

## 2018-04-30 DIAGNOSIS — Z992 Dependence on renal dialysis: Secondary | ICD-10-CM | POA: Diagnosis not present

## 2018-04-30 DIAGNOSIS — D631 Anemia in chronic kidney disease: Secondary | ICD-10-CM | POA: Diagnosis not present

## 2018-04-30 DIAGNOSIS — L89324 Pressure ulcer of left buttock, stage 4: Secondary | ICD-10-CM | POA: Diagnosis not present

## 2018-04-30 DIAGNOSIS — L89314 Pressure ulcer of right buttock, stage 4: Secondary | ICD-10-CM | POA: Diagnosis not present

## 2018-04-30 DIAGNOSIS — E11621 Type 2 diabetes mellitus with foot ulcer: Secondary | ICD-10-CM | POA: Diagnosis not present

## 2018-04-30 DIAGNOSIS — L89154 Pressure ulcer of sacral region, stage 4: Secondary | ICD-10-CM | POA: Diagnosis not present

## 2018-04-30 DIAGNOSIS — N2581 Secondary hyperparathyroidism of renal origin: Secondary | ICD-10-CM | POA: Diagnosis not present

## 2018-04-30 DIAGNOSIS — N186 End stage renal disease: Secondary | ICD-10-CM | POA: Diagnosis not present

## 2018-04-30 DIAGNOSIS — E1122 Type 2 diabetes mellitus with diabetic chronic kidney disease: Secondary | ICD-10-CM | POA: Diagnosis not present

## 2018-04-30 DIAGNOSIS — D509 Iron deficiency anemia, unspecified: Secondary | ICD-10-CM | POA: Diagnosis not present

## 2018-04-30 DIAGNOSIS — L89894 Pressure ulcer of other site, stage 4: Secondary | ICD-10-CM | POA: Diagnosis not present

## 2018-05-02 DIAGNOSIS — D509 Iron deficiency anemia, unspecified: Secondary | ICD-10-CM | POA: Diagnosis not present

## 2018-05-02 DIAGNOSIS — E1122 Type 2 diabetes mellitus with diabetic chronic kidney disease: Secondary | ICD-10-CM | POA: Diagnosis not present

## 2018-05-02 DIAGNOSIS — Z992 Dependence on renal dialysis: Secondary | ICD-10-CM | POA: Diagnosis not present

## 2018-05-02 DIAGNOSIS — N2581 Secondary hyperparathyroidism of renal origin: Secondary | ICD-10-CM | POA: Diagnosis not present

## 2018-05-02 DIAGNOSIS — D631 Anemia in chronic kidney disease: Secondary | ICD-10-CM | POA: Diagnosis not present

## 2018-05-02 DIAGNOSIS — L89893 Pressure ulcer of other site, stage 3: Secondary | ICD-10-CM | POA: Diagnosis not present

## 2018-05-02 DIAGNOSIS — L89314 Pressure ulcer of right buttock, stage 4: Secondary | ICD-10-CM | POA: Diagnosis not present

## 2018-05-02 DIAGNOSIS — L89324 Pressure ulcer of left buttock, stage 4: Secondary | ICD-10-CM | POA: Diagnosis not present

## 2018-05-02 DIAGNOSIS — E11621 Type 2 diabetes mellitus with foot ulcer: Secondary | ICD-10-CM | POA: Diagnosis not present

## 2018-05-02 DIAGNOSIS — N186 End stage renal disease: Secondary | ICD-10-CM | POA: Diagnosis not present

## 2018-05-02 DIAGNOSIS — L89894 Pressure ulcer of other site, stage 4: Secondary | ICD-10-CM | POA: Diagnosis not present

## 2018-05-02 DIAGNOSIS — L89154 Pressure ulcer of sacral region, stage 4: Secondary | ICD-10-CM | POA: Diagnosis not present

## 2018-05-03 ENCOUNTER — Encounter (HOSPITAL_COMMUNITY): Payer: Self-pay | Admitting: *Deleted

## 2018-05-03 ENCOUNTER — Emergency Department (HOSPITAL_COMMUNITY): Payer: Medicare Other

## 2018-05-03 ENCOUNTER — Inpatient Hospital Stay (HOSPITAL_COMMUNITY)
Admission: EM | Admit: 2018-05-03 | Discharge: 2018-05-07 | DRG: 592 | Disposition: A | Discharge status: Skilled Nursing Facility | Payer: Medicare Other | Attending: Internal Medicine | Admitting: Internal Medicine

## 2018-05-03 DIAGNOSIS — I5022 Chronic systolic (congestive) heart failure: Secondary | ICD-10-CM | POA: Diagnosis present

## 2018-05-03 DIAGNOSIS — D631 Anemia in chronic kidney disease: Secondary | ICD-10-CM | POA: Diagnosis present

## 2018-05-03 DIAGNOSIS — L89154 Pressure ulcer of sacral region, stage 4: Secondary | ICD-10-CM | POA: Diagnosis not present

## 2018-05-03 DIAGNOSIS — N2581 Secondary hyperparathyroidism of renal origin: Secondary | ICD-10-CM | POA: Diagnosis present

## 2018-05-03 DIAGNOSIS — R1310 Dysphagia, unspecified: Secondary | ICD-10-CM | POA: Diagnosis present

## 2018-05-03 DIAGNOSIS — I132 Hypertensive heart and chronic kidney disease with heart failure and with stage 5 chronic kidney disease, or end stage renal disease: Secondary | ICD-10-CM | POA: Diagnosis present

## 2018-05-03 DIAGNOSIS — Z87440 Personal history of urinary (tract) infections: Secondary | ICD-10-CM | POA: Diagnosis not present

## 2018-05-03 DIAGNOSIS — Z810 Family history of intellectual disabilities: Secondary | ICD-10-CM

## 2018-05-03 DIAGNOSIS — Z833 Family history of diabetes mellitus: Secondary | ICD-10-CM | POA: Diagnosis not present

## 2018-05-03 DIAGNOSIS — N189 Chronic kidney disease, unspecified: Secondary | ICD-10-CM | POA: Diagnosis not present

## 2018-05-03 DIAGNOSIS — T68XXXA Hypothermia, initial encounter: Secondary | ICD-10-CM | POA: Diagnosis not present

## 2018-05-03 DIAGNOSIS — L89159 Pressure ulcer of sacral region, unspecified stage: Secondary | ICD-10-CM | POA: Diagnosis present

## 2018-05-03 DIAGNOSIS — G35 Multiple sclerosis: Secondary | ICD-10-CM | POA: Diagnosis not present

## 2018-05-03 DIAGNOSIS — Z7401 Bed confinement status: Secondary | ICD-10-CM | POA: Diagnosis not present

## 2018-05-03 DIAGNOSIS — Z7189 Other specified counseling: Secondary | ICD-10-CM | POA: Diagnosis not present

## 2018-05-03 DIAGNOSIS — Z992 Dependence on renal dialysis: Secondary | ICD-10-CM

## 2018-05-03 DIAGNOSIS — R509 Fever, unspecified: Secondary | ICD-10-CM | POA: Diagnosis present

## 2018-05-03 DIAGNOSIS — D649 Anemia, unspecified: Secondary | ICD-10-CM | POA: Diagnosis not present

## 2018-05-03 DIAGNOSIS — E1129 Type 2 diabetes mellitus with other diabetic kidney complication: Secondary | ICD-10-CM | POA: Diagnosis present

## 2018-05-03 DIAGNOSIS — I361 Nonrheumatic tricuspid (valve) insufficiency: Secondary | ICD-10-CM | POA: Diagnosis not present

## 2018-05-03 DIAGNOSIS — N186 End stage renal disease: Secondary | ICD-10-CM

## 2018-05-03 DIAGNOSIS — T17908A Unspecified foreign body in respiratory tract, part unspecified causing other injury, initial encounter: Secondary | ICD-10-CM

## 2018-05-03 DIAGNOSIS — Z8614 Personal history of Methicillin resistant Staphylococcus aureus infection: Secondary | ICD-10-CM | POA: Diagnosis not present

## 2018-05-03 DIAGNOSIS — Z87442 Personal history of urinary calculi: Secondary | ICD-10-CM | POA: Diagnosis not present

## 2018-05-03 DIAGNOSIS — Z89422 Acquired absence of other left toe(s): Secondary | ICD-10-CM | POA: Diagnosis not present

## 2018-05-03 DIAGNOSIS — Z794 Long term (current) use of insulin: Secondary | ICD-10-CM | POA: Diagnosis not present

## 2018-05-03 DIAGNOSIS — G8250 Quadriplegia, unspecified: Secondary | ICD-10-CM | POA: Diagnosis not present

## 2018-05-03 DIAGNOSIS — R630 Anorexia: Secondary | ICD-10-CM | POA: Diagnosis present

## 2018-05-03 DIAGNOSIS — G8220 Paraplegia, unspecified: Secondary | ICD-10-CM | POA: Diagnosis not present

## 2018-05-03 DIAGNOSIS — Z960 Presence of urogenital implants: Secondary | ICD-10-CM | POA: Diagnosis not present

## 2018-05-03 DIAGNOSIS — Z66 Do not resuscitate: Secondary | ICD-10-CM | POA: Diagnosis present

## 2018-05-03 DIAGNOSIS — R531 Weakness: Secondary | ICD-10-CM | POA: Diagnosis not present

## 2018-05-03 DIAGNOSIS — N319 Neuromuscular dysfunction of bladder, unspecified: Secondary | ICD-10-CM | POA: Diagnosis present

## 2018-05-03 DIAGNOSIS — Z823 Family history of stroke: Secondary | ICD-10-CM

## 2018-05-03 DIAGNOSIS — E162 Hypoglycemia, unspecified: Secondary | ICD-10-CM | POA: Diagnosis not present

## 2018-05-03 DIAGNOSIS — L89314 Pressure ulcer of right buttock, stage 4: Secondary | ICD-10-CM | POA: Diagnosis not present

## 2018-05-03 DIAGNOSIS — B9562 Methicillin resistant Staphylococcus aureus infection as the cause of diseases classified elsewhere: Secondary | ICD-10-CM | POA: Diagnosis present

## 2018-05-03 DIAGNOSIS — J90 Pleural effusion, not elsewhere classified: Secondary | ICD-10-CM | POA: Diagnosis not present

## 2018-05-03 DIAGNOSIS — L89893 Pressure ulcer of other site, stage 3: Secondary | ICD-10-CM | POA: Diagnosis not present

## 2018-05-03 DIAGNOSIS — M533 Sacrococcygeal disorders, not elsewhere classified: Secondary | ICD-10-CM | POA: Diagnosis present

## 2018-05-03 DIAGNOSIS — Z79899 Other long term (current) drug therapy: Secondary | ICD-10-CM

## 2018-05-03 DIAGNOSIS — R7881 Bacteremia: Secondary | ICD-10-CM | POA: Diagnosis present

## 2018-05-03 DIAGNOSIS — L89894 Pressure ulcer of other site, stage 4: Secondary | ICD-10-CM | POA: Diagnosis not present

## 2018-05-03 DIAGNOSIS — E1122 Type 2 diabetes mellitus with diabetic chronic kidney disease: Secondary | ICD-10-CM | POA: Diagnosis not present

## 2018-05-03 DIAGNOSIS — Z515 Encounter for palliative care: Secondary | ICD-10-CM | POA: Diagnosis present

## 2018-05-03 DIAGNOSIS — Z87891 Personal history of nicotine dependence: Secondary | ICD-10-CM | POA: Diagnosis not present

## 2018-05-03 DIAGNOSIS — R5081 Fever presenting with conditions classified elsewhere: Secondary | ICD-10-CM | POA: Diagnosis not present

## 2018-05-03 DIAGNOSIS — L89324 Pressure ulcer of left buttock, stage 4: Secondary | ICD-10-CM | POA: Diagnosis not present

## 2018-05-03 DIAGNOSIS — Z9889 Other specified postprocedural states: Secondary | ICD-10-CM | POA: Diagnosis not present

## 2018-05-03 DIAGNOSIS — R0689 Other abnormalities of breathing: Secondary | ICD-10-CM | POA: Diagnosis not present

## 2018-05-03 DIAGNOSIS — T17908S Unspecified foreign body in respiratory tract, part unspecified causing other injury, sequela: Secondary | ICD-10-CM | POA: Diagnosis not present

## 2018-05-03 DIAGNOSIS — I129 Hypertensive chronic kidney disease with stage 1 through stage 4 chronic kidney disease, or unspecified chronic kidney disease: Secondary | ICD-10-CM | POA: Diagnosis not present

## 2018-05-03 DIAGNOSIS — E161 Other hypoglycemia: Secondary | ICD-10-CM | POA: Diagnosis not present

## 2018-05-03 DIAGNOSIS — E11621 Type 2 diabetes mellitus with foot ulcer: Secondary | ICD-10-CM | POA: Diagnosis not present

## 2018-05-03 DIAGNOSIS — R0902 Hypoxemia: Secondary | ICD-10-CM | POA: Diagnosis not present

## 2018-05-03 LAB — COMPREHENSIVE METABOLIC PANEL
ALK PHOS: 62 U/L (ref 38–126)
ALT: 10 U/L (ref 0–44)
AST: 17 U/L (ref 15–41)
Albumin: 1.5 g/dL — ABNORMAL LOW (ref 3.5–5.0)
Anion gap: 12 (ref 5–15)
BUN: 29 mg/dL — AB (ref 8–23)
CO2: 27 mmol/L (ref 22–32)
CREATININE: 2.45 mg/dL — AB (ref 0.61–1.24)
Calcium: 7.9 mg/dL — ABNORMAL LOW (ref 8.9–10.3)
Chloride: 97 mmol/L — ABNORMAL LOW (ref 98–111)
GFR calc Af Amer: 28 mL/min — ABNORMAL LOW (ref >60)
GFR calc non Af Amer: 24 mL/min — ABNORMAL LOW (ref >60)
Glucose, Bld: 114 mg/dL — ABNORMAL HIGH (ref 70–99)
Potassium: 3.7 mmol/L (ref 3.5–5.1)
Sodium: 136 mmol/L (ref 135–145)
Total Bilirubin: 0.6 mg/dL (ref 0.3–1.2)
Total Protein: 6.5 g/dL (ref 6.5–8.1)

## 2018-05-03 LAB — URINALYSIS, ROUTINE W REFLEX MICROSCOPIC
Bilirubin Urine: NEGATIVE
GLUCOSE, UA: NEGATIVE mg/dL
Ketones, ur: 5 mg/dL — AB
Nitrite: NEGATIVE
PH: 7 (ref 5.0–8.0)
Protein, ur: 100 mg/dL — AB
Specific Gravity, Urine: 1.015 (ref 1.005–1.030)
WBC, UA: >50 WBC/hpf — ABNORMAL HIGH (ref 0–5)

## 2018-05-03 LAB — CBC WITH DIFFERENTIAL/PLATELET
Abs Immature Granulocytes: 0.08 10*3/uL — ABNORMAL HIGH (ref 0.00–0.07)
Basophils Absolute: 0 10*3/uL (ref 0.0–0.1)
Basophils Relative: 0 %
Eosinophils Absolute: 0 10*3/uL (ref 0.0–0.5)
Eosinophils Relative: 0 %
HEMATOCRIT: 19.7 % — AB (ref 39.0–52.0)
Hemoglobin: 5.8 g/dL — CL (ref 13.0–17.0)
Immature Granulocytes: 1 %
Lymphocytes Relative: 6 %
Lymphs Abs: 0.7 10*3/uL (ref 0.7–4.0)
MCH: 29.7 pg (ref 26.0–34.0)
MCHC: 29.4 g/dL — ABNORMAL LOW (ref 30.0–36.0)
MCV: 101 fL — ABNORMAL HIGH (ref 80.0–100.0)
Monocytes Absolute: 0.6 10*3/uL (ref 0.1–1.0)
Monocytes Relative: 6 %
Neutro Abs: 8.9 10*3/uL — ABNORMAL HIGH (ref 1.7–7.7)
Neutrophils Relative %: 87 %
Platelets: 146 10*3/uL — ABNORMAL LOW (ref 150–400)
RBC: 1.95 MIL/uL — ABNORMAL LOW (ref 4.22–5.81)
RDW: 18.1 % — ABNORMAL HIGH (ref 11.5–15.5)
WBC: 10.3 10*3/uL (ref 4.0–10.5)
nRBC: 0 % (ref 0.0–0.2)

## 2018-05-03 LAB — INFLUENZA PANEL BY PCR (TYPE A & B)
INFLBPCR: NEGATIVE
Influenza A By PCR: NEGATIVE

## 2018-05-03 LAB — POC OCCULT BLOOD, ED: Fecal Occult Bld: NEGATIVE

## 2018-05-03 LAB — I-STAT CG4 LACTIC ACID, ED
Lactic Acid, Venous: 1.52 mmol/L (ref 0.5–1.9)
Lactic Acid, Venous: 0.96 mmol/L (ref 0.5–1.9)

## 2018-05-03 LAB — PREPARE RBC (CROSSMATCH)

## 2018-05-03 LAB — CBG MONITORING, ED: Glucose-Capillary: 189 mg/dL — ABNORMAL HIGH (ref 70–99)

## 2018-05-03 MED ORDER — MIDODRINE HCL 5 MG PO TABS
10.0000 mg | ORAL_TABLET | ORAL | Status: DC | PRN
  Filled 2018-05-03: qty 2

## 2018-05-03 MED ORDER — ACETAMINOPHEN 325 MG PO TABS: 650.0000 mg | ORAL_TABLET | Freq: Four times a day (QID) | ORAL | Status: DC | PRN

## 2018-05-03 MED ORDER — INSULIN ASPART 100 UNIT/ML ~~LOC~~ SOLN: 3.0000 [IU] | Freq: Three times a day (TID) | SUBCUTANEOUS | Status: DC

## 2018-05-03 MED ORDER — INSULIN ASPART 100 UNIT/ML ~~LOC~~ SOLN
0.0000 [IU] | Freq: Three times a day (TID) | SUBCUTANEOUS | Status: DC
  Administered 2018-05-04 (×2): 1 [IU] via SUBCUTANEOUS
  Administered 2018-05-05: 2 [IU] via SUBCUTANEOUS

## 2018-05-03 MED ORDER — VANCOMYCIN HCL 500 MG IV SOLR
500.0000 mg | Freq: Once | INTRAVENOUS | Status: AC
  Administered 2018-05-04: 500 mg via INTRAVENOUS
  Filled 2018-05-03 (×2): qty 500

## 2018-05-03 MED ORDER — BACLOFEN 10 MG PO TABS
10.0000 mg | ORAL_TABLET | Freq: Every evening | ORAL | Status: DC | PRN
  Filled 2018-05-03: qty 1

## 2018-05-03 MED ORDER — INSULIN DETEMIR 100 UNIT/ML ~~LOC~~ SOLN
5.0000 [IU] | Freq: Every day | SUBCUTANEOUS | Status: DC
  Administered 2018-05-04 – 2018-05-05 (×2): 5 [IU] via SUBCUTANEOUS
  Filled 2018-05-03 (×2): qty 0.05

## 2018-05-03 MED ORDER — ONDANSETRON HCL 4 MG PO TABS: 4.0000 mg | ORAL_TABLET | Freq: Four times a day (QID) | ORAL | Status: DC | PRN

## 2018-05-03 MED ORDER — SIMVASTATIN 20 MG PO TABS
20.0000 mg | ORAL_TABLET | Freq: Every evening | ORAL | Status: DC
  Administered 2018-05-04: 20 mg via ORAL
  Filled 2018-05-03 (×2): qty 1

## 2018-05-03 MED ORDER — SODIUM CHLORIDE 0.9 % IV SOLN: 10.0000 mL/h | Freq: Once | INTRAVENOUS | Status: DC

## 2018-05-03 MED ORDER — SODIUM CHLORIDE 0.9 % IV SOLN
2.0000 g | Freq: Once | INTRAVENOUS | Status: AC
  Administered 2018-05-03: 2 g via INTRAVENOUS
  Filled 2018-05-03: qty 2

## 2018-05-03 MED ORDER — ONDANSETRON HCL 4 MG/2ML IJ SOLN: 4.0000 mg | Freq: Four times a day (QID) | INTRAMUSCULAR | Status: DC | PRN

## 2018-05-03 MED ORDER — VANCOMYCIN HCL IN DEXTROSE 1-5 GM/200ML-% IV SOLN
1000.0000 mg | Freq: Once | INTRAVENOUS | Status: AC
  Administered 2018-05-03: 1000 mg via INTRAVENOUS
  Filled 2018-05-03: qty 200

## 2018-05-03 MED ORDER — CARVEDILOL 3.125 MG PO TABS
3.1250 mg | ORAL_TABLET | Freq: Two times a day (BID) | ORAL | Status: DC
  Filled 2018-05-03: qty 1

## 2018-05-03 MED ORDER — SODIUM CHLORIDE 0.9 % IV SOLN
1.0000 g | INTRAVENOUS | Status: DC
  Filled 2018-05-03: qty 1

## 2018-05-03 MED ORDER — ACETAMINOPHEN 650 MG RE SUPP: 650.0000 mg | Freq: Four times a day (QID) | RECTAL | Status: DC | PRN

## 2018-05-03 NOTE — ED Notes (Signed)
Daughter, francine cook, would like a call with updates at [phone]

## 2018-05-03 NOTE — ED Provider Notes (Signed)
[facility] EMERGENCY DEPARTMENT Provider Note   CSN: [id number] Arrival date & time: 05/03/18  1436     History   Chief Complaint Chief Complaint  Patient presents with  . Fever    HPI Lance [name] is a 77 y.o. male.  Patient with history of multiple sclerosis, paraplegia, decubitus ulcers of the sacral area as well as the heels, congestive heart failure, end-stage renal disease on dialysis with last session yesterday, anemia of chronic disease requiring transfusion -- presents the emergency department with worsening weakness, new cough, fever to 102 degrees at home.  Patient does not have any pain.  He reports being generally weak.  He was given a gram of Tylenol prior to arrival.  Patient does not otherwise give a detailed history.  Does deny any N/V/D, dysuria.      Past Medical History:  Diagnosis Date  . Acute CHF (congestive heart failure) ([place]) 02/14/2016  . Acute osteomyelitis, pelvis, right ([place])   . Acute renal failure ([place])   . Anemia in chronic renal disease   . Blood transfusion   . Cardiomyopathy ([place]) 02/17/2016  . Chronic spastic paraplegia    secondary to MS  . Decreased sensation of lower extremity    due to MS  . Decubitus ulcer of ischium   . Decubitus ulcer of trochanter, stage 4 ([place]) 06/14/2015  . ESRD (end stage renal disease) ([place])    [place]  . History of kidney stones    x2  . History of MRSA infection    urine  . History of recurrent UTIs   . Hypertension   . MS (multiple sclerosis) ([place])   . Multiple sclerosis ([place])    stated by client in triage   . Neuralgia neuritis, sciatic nerve    MS for 30 years  . Neurogenic bladder   . PONV (postoperative nausea and vomiting)   . Presence of indwelling urinary catheter   . Self-catheterizes urinary bladder   . Type 2 diabetes mellitus [facility])     Patient Active Problem List   Diagnosis Date Noted  . Symptomatic anemia 03/05/2018  . Goals of  care, counseling/discussion   . Palliative care encounter   . ESRD on hemodialysis ([place]) 09/05/2017  . Thrombocytopenia ([place]) 09/05/2017  . Pressure injury of skin 09/04/2017  . Moderate protein-calorie malnutrition ([place]) 03/19/2017  . Sepsis secondary to UTI ([place]) 03/18/2017  . Sepsis ([place]) 03/18/2017  . Partial nontraumatic amputation of foot, left ([place]) 01/27/2017  . Malnutrition of moderate degree 07/03/2016  . Macrocytic anemia 07/02/2016  . SIRS (systemic inflammatory response syndrome) ([place]) 05/09/2016  . Melena 05/09/2016  . Hematemesis 05/09/2016  . Non-intractable vomiting with nausea 05/07/2016  . Altered mental status 04/21/2016  . Acute respiratory failure ([place])   . ESRD (end stage renal disease) ([place])   . NICM (nonischemic cardiomyopathy) ([place])   . Impaired functional mobility, balance, and endurance 02/17/2016  . Generalized anxiety disorder 02/17/2016  . Cardiomyopathy ([place]) 02/17/2016  . Hypoxia   . Acute pulmonary edema (H[facility])   . Elevated troponin 02/14/2016  . Acute systolic heart failure ([place]) 02/14/2016  . Depression 02/13/2016  . DM (diabetes mellitus) type II controlled with renal manifestation ([place]) 09/06/2015  . Dyslipidemia associated with type 2 diabetes mellitus ([place]) 09/06/2015  . Recurrent UTI (urinary tract infection) 09/06/2015  . Decubitus ulcer of right ischial area 06/14/2015  . Decubitus ulcer of trochanter, stage 4 ([place]) 06/14/2015  . Left perineal ischial pressure  ulcer   . Acute osteomyelitis, pelvis, right ([place])   . Complicated urinary tract infection   . Essential hypertension, benign 08/03/2013  . Spastic quadriplegia ([name]) 07/10/2011  . Multiple sclerosis ([place]) 03/30/2011  . Sacral decubitus ulcer 03/30/2011  . Progressive anemia 03/30/2011    Past Surgical History:  Procedure Laterality Date  . AMPUTATION Left 01/22/2017   Procedure: Left 5th Ray Amputation;  Surgeon: Newt Minion, MD;  Location: [facility];  Service: Orthopedics;   Laterality: Left;  . APPLICATION OF A-CELL OF EXTREMITY Left 09/05/2014   Procedure: PLACEMENT OF ACELL AND VAC;  Surgeon: Theodoro Kos, DO;  Location: [facility];  Service: Plastics;  Laterality: Left;  . BASCILIC VEIN TRANSPOSITION Right 03/24/2016   Procedure: RADIAL- CEPHALIC FISTULA CREATION RIGHT ARM;  Surgeon: Conrad Mount Hermon, MD;  Location: [name];  Service: Vascular;  Laterality: Right;  . Buttocks flap Left   . COLONOSCOPY    . EXTRACORPOREAL SHOCK WAVE LITHOTRIPSY  03/23/2011   x2  . FINGER SURGERY  2004  . hip flap Right   . INCISION AND DRAINAGE OF WOUND Left 09/05/2014   Procedure: IRRIGATION AND DEBRIDEMENT OF LEFT ISCHIUM WOUND WITH ;  Surgeon: Theodoro Kos, DO;  Location: [facility];  Service: Plastics;  Laterality: Left;  . INSERTION OF DIALYSIS CATHETER N/A 02/27/2016   Procedure: INSERTION OF DIALYSIS CATHETER-RIGHT INTERNAL JUGULA PLACEMENT;  Surgeon: Conrad Hawkins, MD;  Location: [facility];  Service: Vascular;  Laterality: N/A;  . TONSILLECTOMY    . WOUND DEBRIDEMENT     10/2/17[facility]         Home Medications    Prior to Admission medications   Medication Sig Start Date End Date Taking? Authorizing Provider  acetaminophen (TYLENOL) 325 MG tablet Take 2 tablets (650 mg total) by mouth every 6 (six) hours as needed for mild pain (or Fever >/= 101). 09/09/17   Roxan Hockey, MD  baclofen (LIORESAL) 10 MG tablet Take 1 tablet (10 mg total) by mouth at bedtime as needed for muscle spasms. Patient taking differently: Take 10 mg by mouth daily.  07/15/16   Lauree Chandler, NP  carvedilol (COREG) 3.125 MG tablet Take 1 tablet (3.125 mg total) by mouth 2 (two) times daily with a meal. 07/15/16   Eubanks, Carlos American, NP  insulin aspart (NOVOLOG FLEXPEN) 100 UNIT/ML FlexPen Inject 0-9 Units into the skin 3 (three) times daily with meals. Per sliding scale Patient taking differently: Inject 7 Units 3 (three) times daily with meals into the skin. Per sliding scale 07/20/16   Lauree Chandler, NP   insulin detemir (LEVEMIR) 100 UNIT/ML injection Inject 0.05 mLs (5 Units total) into the skin daily with breakfast. 03/07/18   Ghimire, Henreitta Leber, MD  Insulin Pen Needle (PEN NEEDLES) 30G X 8 MM MISC Inject 1 each into the skin 4 (four) times daily. . 07/20/16   Lauree Chandler, NP  lidocaine-prilocaine (EMLA) cream Apply 1 application topically as needed. Patient taking differently: Apply 1 application topically as needed (for pain).  07/24/16   Conrad Ludlow, MD  Melatonin 3 MG TABS Take 3 mg by mouth at bedtime.    [provider]  midodrine (PROAMATINE) 10 MG tablet Take 10 mg by mouth as needed. 01/26/18   [provider]  SANTYL ointment Apply 1 application topically as needed. 01/13/18   [provider]  simvastatin (ZOCOR) 20 MG tablet Take 20 mg by mouth every evening.    [provider]  Skin  Protectants, Misc. (EUCERIN) cream Apply 1 application daily as needed topically for dry skin. For feet     [provider]  trimethoprim (TRIMPEX) 100 MG tablet Take 100 mg by mouth daily. 12/22/17   [provider]    Family History Family History  Problem Relation Age of Onset  . Stroke Father   . Diabetes Father   . Diabetes Paternal Grandmother   . Stroke Paternal Grandfather   . Heart disease Neg Hx   . Cancer Neg Hx     Social History Social History   Tobacco Use  . Smoking status: Former Smoker    Types: Cigars    Last attempt to quit: 05/06/1983    Years since quitting: 35.0  . Smokeless tobacco: Never Used  Substance Use Topics  . Alcohol use: No    Alcohol/week: 1.0 standard drinks    Types: 1 Glasses of wine per week    Comment: pt has not had any since Dec 2017  . Drug use: No     Allergies   Patient has no known allergies.   Review of Systems Review of Systems  Constitutional: Positive for fever.  HENT: Negative for rhinorrhea and sore throat.   Eyes: Negative for redness.  Respiratory: Positive for  cough.   Cardiovascular: Negative for chest pain.  Gastrointestinal: Negative for abdominal pain, diarrhea, nausea and vomiting.  Genitourinary: Negative for dysuria.  Musculoskeletal: Negative for myalgias.  Skin: Positive for wound. Negative for rash.  Neurological: Positive for weakness. Negative for headaches.     Physical Exam Updated Vital Signs BP (!) 100/56 (BP Location: Left Arm)   Pulse 84   Temp 99.2 F (37.3 C) (Oral)   Resp 13   SpO2 98%   Physical Exam Vitals signs and nursing note reviewed.  Constitutional:      Appearance: He is well-developed.  HENT:     Head: Normocephalic and atraumatic.     Mouth/Throat:     Mouth: Mucous membranes are moist.  Eyes:     General:        Right eye: No discharge.        Left eye: No discharge.     Conjunctiva/sclera: Conjunctivae normal.  Neck:     Musculoskeletal: Normal range of motion and neck supple.  Cardiovascular:     Rate and Rhythm: Normal rate and regular rhythm.     Heart sounds: Normal heart sounds.  Pulmonary:     Effort: Pulmonary effort is normal.     Breath sounds: Normal breath sounds.  Abdominal:     Palpations: Abdomen is soft.     Tenderness: There is no abdominal tenderness. There is no guarding or rebound.  Skin:    General: Skin is warm and dry.     Coloration: Skin is pale.  Neurological:     Mental Status: He is alert.      ED Treatments / Results  Labs (all labs ordered are listed, but only abnormal results are displayed) Labs Reviewed  COMPREHENSIVE METABOLIC PANEL - Abnormal; Notable for the following components:      Result Value   Chloride 97 (*)    Glucose, Bld 114 (*)    BUN 29 (*)    Creatinine, Ser 2.45 (*)    Calcium 7.9 (*)    Albumin 1.5 (*)    GFR calc non Af Amer 24 (*)    GFR calc Af Amer 28 (*)    All other components within normal limits  CBC WITH DIFFERENTIAL/PLATELET - Abnormal; Notable for the following components:   RBC 1.95 (*)    Hemoglobin 5.8 (*)      HCT 19.7 (*)    MCV 101.0 (*)    MCHC 29.4 (*)    RDW 18.1 (*)    Platelets 146 (*)    Neutro Abs 8.9 (*)    Abs Immature Granulocytes 0.08 (*)    All other components within normal limits  URINALYSIS, ROUTINE W REFLEX MICROSCOPIC - Abnormal; Notable for the following components:   Color, Urine AMBER (*)    APPearance CLOUDY (*)    Hgb urine dipstick LARGE (*)    Ketones, ur 5 (*)    Protein, ur 100 (*)    Leukocytes, UA MODERATE (*)    RBC / HPF >50 (*)    WBC, UA >50 (*)    Bacteria, UA MANY (*)    All other components within normal limits  CULTURE, BLOOD (ROUTINE X 2)  CULTURE, BLOOD (ROUTINE X 2)  URINE CULTURE  INFLUENZA PANEL BY PCR (TYPE A & B)  SEDIMENTATION RATE  C-REACTIVE PROTEIN  CBC  BASIC METABOLIC PANEL  I-STAT CG4 LACTIC ACID, ED  I-STAT CG4 LACTIC ACID, ED  POC OCCULT BLOOD, ED  TYPE AND SCREEN  PREPARE RBC (CROSSMATCH)    EKG EKG Interpretation  Date/Time:  Tuesday May 03 2018 15:05:24 EST Ventricular Rate:  90 PR Interval:    QRS Duration: 104 QT Interval:  430 QTC Calculation: 527 R Axis:   45 Text Interpretation:  Sinus rhythm Atrial premature complexes Prolonged QT interval Baseline wander Confirmed by Lajean Saver [phone]) on 05/03/2018 3:17:27 PM   Radiology Dg Chest Port 1 View  Result Date: 05/03/2018 CLINICAL DATA:  Weakness with fever. EXAM: PORTABLE CHEST 1 VIEW COMPARISON:  04/18/2018. FINDINGS: Borderline cardiac enlargement. LEFT pleural effusion appears increased. Slight retrocardiac density may be persistent. Streaky BILATERAL interstitial markings represent emphysematous change particularly at the lung apices. No definite consolidation. IMPRESSION: 1. Increasing LEFT pleural effusion. Electronically Signed   By: Staci Righter M.D.   On: 05/03/2018 15:31    Procedures Procedures (including critical care time)  Medications Ordered in ED Medications - No data to display   Initial Impression / Assessment and Plan / ED  Course  I have reviewed the triage vital signs and the nursing notes.  Pertinent labs & imaging results that were available during my care of the patient were reviewed by me and considered in my medical decision making (see chart for details).     Patient seen and examined. Work-up initiated. Medications ordered.   Vital signs reviewed and are as follows: BP (!) 100/56 (BP Location: Left Arm)   Pulse 84   Temp 99.2 F (37.3 C) (Oral)   Resp 13   SpO2 98%   3:38 PM CXR and EKG reviewed. CXR with increased L pleural effusion from 2 weeks ago.   I evaluated the patient's decubitus ulcers with the help of nurse tech and RN.  Patient had soiled himself.  He had stool in several of the ulcerations.  Assisted nursing for some time cleaning and redressing these areas.  Rectal temperature taken.  Stool is heme-negative.  Labs reviewed.  Patient with low hemoglobin requiring blood transfusion.  This is ordered.  Will cover with antibiotics.  Unknown source of fever at this time.  I attempted to contact the patient's daughter x2 without success.  Patient updated on the plan for admission.  Spoke with Dr.  Alcario Drought who will see patient in the emergency department.   Final Clinical Impressions(s) / ED Diagnoses   Final diagnoses:  Symptomatic anemia  Fever, unspecified fever cause   Admit.   ED Discharge Orders    None       Carlisle Cater, Hershal Coria 05/03/18 2004    Lajean Saver, MD 05/04/18 1540

## 2018-05-03 NOTE — Progress Notes (Signed)
Pharmacy Antibiotic Note  Lance [name] is a 77 y.o. male admitted on 05/03/2018 with sepsis.  Pharmacy has been consulted for vancomycin and cefepime dosing.  Patient is on iHD TTS schedule but may be off schedule for holiday.   Plan: Give total 1500mg  IV vancomycin (give extra 500mg ) Cefepime 1g q24 hours until HD regimen is established   Weight: 160 lb (72.6 kg)(estimate for antibiotic dosing)  Temp (24hrs), Avg:98.5 F (36.9 C), Min:97.8 F (36.6 C), Max:99.2 F (37.3 C)  Recent Labs  Lab 05/03/18 1536 05/03/18 1552  WBC 10.3  --   CREATININE 2.45*  --   LATICACIDVEN  --  1.52    Estimated Creatinine Clearance: 25.9 mL/min (A) (by C-G formula based on SCr of 2.45 mg/dL (H)).    No Known Allergies Thank you for allowing pharmacy to be a part of this patient's care.  Erin Hearing PharmD., BCPS Clinical Pharmacist 05/03/2018 5:53 PM

## 2018-05-03 NOTE — ED Triage Notes (Signed)
Pt is a dialysis pt with a T/Th/S schedule. Pt has graft to rt arm. Pt received last treatment yesterday due to the holiday. Pt also received routine blood transfusions and is scheduled for that on Friday.

## 2018-05-03 NOTE — ED Triage Notes (Signed)
Per EMS- pt has 3 stage 4 wounds to his sacrum as well as one to each foot. Pt was seen by his home health nurse yesterday and was afebrile but feeling weak. Pt was then found to have a fever of 102 today and was given 1000mg  of tylenol prior to transport. Pt has hx of MS but is alert and oriented. Pt has a foley upon arrival as well.

## 2018-05-03 NOTE — ED Notes (Signed)
ED Provider at bedside. 

## 2018-05-03 NOTE — Discharge Planning (Signed)
EDCM spoke with EMS regarding pt home conditions.  Pt lives at home with  demented spouse and special needs son.  Pt active with [facility] via [facility] Cyndie Chime [phone]) who encouraged pt to come to ED after [facility] visit.  HHRN communicated to EMS that living conditions are not safe for pt immediate return.  EDCM attempted to call [facility] for clarification (twice).  Will continue to follow for pt needs.

## 2018-05-03 NOTE — H&P (Addendum)
History and Physical    Lance [name] CXK:[id number] [name]: Sep 26, 1940 DOA: 05/03/2018  PCP: Lavone Orn, MD  Patient coming from: Home  I have personally briefly reviewed patient's old medical records in [place]  Chief Complaint: Fever  HPI: Lance [name] is a 77 y.o. male with medical history significant of MS, paraplegia, decubitus ulcers of sacral area as well as heels, CHF, ESRD.  Anemia of CKD requiring weekly transfusion.  Patient presents to the ED with c/o weakness, cough, fever to 102 at home.  Given 1gm tylenol PTA.  No N/V/D.  Had dialysis Monday this week due to holiday.  Normally has dialysis TTS.   ED Course: HGB 5.8.  Patient started on cefepime / vanc.  Hospitalist asked to admit.   Review of Systems: As per HPI otherwise 10 point review of systems negative.   Past Medical History:  Diagnosis Date  . Acute CHF (congestive heart failure) ([place]) 02/14/2016  . Acute osteomyelitis, pelvis, right ([place])   . Acute renal failure ([place])   . Anemia in chronic renal disease   . Blood transfusion   . Cardiomyopathy ([place]) 02/17/2016  . Chronic spastic paraplegia    secondary to MS  . Decreased sensation of lower extremity    due to MS  . Decubitus ulcer of ischium   . Decubitus ulcer of trochanter, stage 4 ([place]) 06/14/2015  . ESRD (end stage renal disease) ([place])    [place]  . History of kidney stones    x2  . History of MRSA infection    urine  . History of recurrent UTIs   . Hypertension   . MS (multiple sclerosis) ([place])   . Multiple sclerosis ([place])    stated by client in triage   . Neuralgia neuritis, sciatic nerve    MS for 30 years  . Neurogenic bladder   . PONV (postoperative nausea and vomiting)   . Presence of indwelling urinary catheter   . Self-catheterizes urinary bladder   . Type 2 diabetes mellitus ([place])     Past Surgical History:  Procedure Laterality Date  . AMPUTATION Left 01/22/2017   Procedure: Left  5th Ray Amputation;  Surgeon: Newt Minion, MD;  Location: [facility];  Service: Orthopedics;  Laterality: Left;  . APPLICATION OF A-CELL OF EXTREMITY Left 09/05/2014   Procedure: PLACEMENT OF ACELL AND VAC;  Surgeon: Theodoro Kos, DO;  Location: [facility];  Service: Plastics;  Laterality: Left;  . BASCILIC VEIN TRANSPOSITION Right 03/24/2016   Procedure: RADIAL- CEPHALIC FISTULA CREATION RIGHT ARM;  Surgeon: Conrad Mulford, MD;  Location: [facility];  Service: Vascular;  Laterality: Right;  . Buttocks flap Left   . COLONOSCOPY    . EXTRACORPOREAL SHOCK WAVE LITHOTRIPSY  03/23/2011   x2  . FINGER SURGERY  2004  . hip flap Right   . INCISION AND DRAINAGE OF WOUND Left 09/05/2014   Procedure: IRRIGATION AND DEBRIDEMENT OF LEFT ISCHIUM WOUND WITH ;  Surgeon: Theodoro Kos, DO;  Location: [facility];  Service: Plastics;  Laterality: Left;  . INSERTION OF DIALYSIS CATHETER N/A 02/27/2016   Procedure: INSERTION OF DIALYSIS CATHETER-RIGHT INTERNAL JUGULA PLACEMENT;  Surgeon: Conrad Hillsdale, MD;  Location: [facility];  Service: Vascular;  Laterality: N/A;  . TONSILLECTOMY    . WOUND DEBRIDEMENT     10/2/17[facility]      reports that he quit smoking about 35 years ago. His smoking use included cigars. He has never used smokeless tobacco. He  reports that he does not drink alcohol or use drugs.  No Known Allergies  Family History  Problem Relation Age of Onset  . Stroke Father   . Diabetes Father   . Diabetes Paternal Grandmother   . Stroke Paternal Grandfather   . Heart disease Neg Hx   . Cancer Neg Hx      Prior to Admission medications   Medication Sig Start Date End Date Taking? Authorizing Provider  acetaminophen (TYLENOL) 325 MG tablet Take 2 tablets (650 mg total) by mouth every 6 (six) hours as needed for mild pain (or Fever >/= 101). 09/09/17  Yes Emokpae, Courage, MD  baclofen (LIORESAL) 10 MG tablet Take 1 tablet (10 mg total) by mouth at bedtime as needed for muscle spasms. Patient taking differently: Take 10  mg by mouth daily.  07/15/16  Yes Lauree Chandler, NP  carvedilol (COREG) 3.125 MG tablet Take 1 tablet (3.125 mg total) by mouth 2 (two) times daily with a meal. 07/15/16  Yes Eubanks, Carlos American, NP  insulin aspart (NOVOLOG FLEXPEN) 100 UNIT/ML FlexPen Inject 0-9 Units into the skin 3 (three) times daily with meals. Per sliding scale Patient taking differently: Inject 7 Units 3 (three) times daily with meals into the skin. Per sliding scale 07/20/16  Yes Lauree Chandler, NP  insulin detemir (LEVEMIR) 100 UNIT/ML injection Inject 0.05 mLs (5 Units total) into the skin daily with breakfast. 03/07/18  Yes Ghimire, Henreitta Leber, MD  lidocaine-prilocaine (EMLA) cream Apply 1 application topically as needed. Patient taking differently: Apply 1 application topically as needed (for pain).  07/24/16  Yes Conrad Rosser, MD  Melatonin 3 MG TABS Take 3 mg by mouth at bedtime.   Yes [provider]  midodrine (PROAMATINE) 10 MG tablet Take 10 mg by mouth as needed. 01/26/18  Yes [provider]  SANTYL ointment Apply 1 application topically as needed. 01/13/18  Yes [provider]  simvastatin (ZOCOR) 20 MG tablet Take 20 mg by mouth every evening.   Yes [provider]  Skin Protectants, Misc. (EUCERIN) cream Apply 1 application daily as needed topically for dry skin. For feet    Yes [provider]  trimethoprim (TRIMPEX) 100 MG tablet Take 100 mg by mouth daily. 12/22/17  Yes [provider]  Insulin Pen Needle (PEN NEEDLES) 30G X 8 MM MISC Inject 1 each into the skin 4 (four) times daily. . 07/20/16   Lauree Chandler, NP    Physical Exam: Vitals:   05/03/18 1830 05/03/18 1845 05/03/18 1848 05/03/18 1911  BP: (!) 104/54 (!) 98/48 (!) 99/45 (!) 113/57  Pulse: 85 75 75 81  Resp: 20 (!) 22 (!) 22 (!) 24  Temp:   (!) 97.4 F (36.3 C) (!) 97.4 F (36.3 C)  TempSrc:      SpO2: 100% 100%  99%  Weight:        Constitutional: NAD, calm,  comfortable Eyes: PERRL, lids and conjunctivae normal ENMT: Mucous membranes are moist. Posterior pharynx clear of any exudate or lesions.Normal dentition.  Neck: normal, supple, no masses, no thyromegaly Respiratory: clear to auscultation bilaterally, no wheezing, no crackles. Normal respiratory effort. No accessory muscle use.  Cardiovascular: Systolic murmur Abdomen: no tenderness, no masses palpated. No hepatosplenomegaly. Bowel sounds positive.  Musculoskeletal: no clubbing / cyanosis. No joint deformity upper and lower extremities. Good ROM, no contractures. Normal muscle tone.  Skin: no rashes, lesions, ulcers. No induration Neurologic: spastic quadriparesis Psychiatric: Normal judgment and insight. Alert  and oriented x 3. Normal mood.    Labs on Admission: I have personally reviewed following labs and imaging studies  CBC: Recent Labs  Lab 05/03/18 1536  WBC 10.3  NEUTROABS 8.9*  HGB 5.8*  HCT 19.7*  MCV 101.0*  PLT 149*   Basic Metabolic Panel: Recent Labs  Lab 05/03/18 1536  NA 136  K 3.7  CL 97*  CO2 27  GLUCOSE 114*  BUN 29*  CREATININE 2.45*  CALCIUM 7.9*   GFR: Estimated Creatinine Clearance: 25.9 mL/min (A) (by C-G formula based on SCr of 2.45 mg/dL (H)). Liver Function Tests: Recent Labs  Lab 05/03/18 1536  AST 17  ALT 10  ALKPHOS 62  BILITOT 0.6  PROT 6.5  ALBUMIN 1.5*   No results for input(s): LIPASE, AMYLASE in the last 168 hours. No results for input(s): AMMONIA in the last 168 hours. Coagulation Profile: No results for input(s): INR, PROTIME in the last 168 hours. Cardiac Enzymes: No results for input(s): CKTOTAL, CKMB, CKMBINDEX, TROPONINI in the last 168 hours. BNP (last 3 results) No results for input(s): PROBNP in the last 8760 hours. HbA1C: No results for input(s): HGBA1C in the last 72 hours. CBG: No results for input(s): GLUCAP in the last 168 hours. Lipid Profile: No results for input(s): CHOL, HDL, LDLCALC, TRIG, CHOLHDL,  LDLDIRECT in the last 72 hours. Thyroid Function Tests: No results for input(s): TSH, T4TOTAL, FREET4, T3FREE, THYROIDAB in the last 72 hours. Anemia Panel: No results for input(s): VITAMINB12, FOLATE, FERRITIN, TIBC, IRON, RETICCTPCT in the last 72 hours. Urine analysis:    Component Value Date/Time   COLORURINE YELLOW 04/18/2018 1507   APPEARANCEUR CLOUDY (A) 04/18/2018 1507   LABSPEC 1.011 04/18/2018 1507   PHURINE 9.0 (H) 04/18/2018 1507   GLUCOSEU NEGATIVE 04/18/2018 1507   HGBUR SMALL (A) 04/18/2018 1507   BILIRUBINUR NEGATIVE 04/18/2018 1507   KETONESUR 5 (A) 04/18/2018 1507   PROTEINUR 100 (A) 04/18/2018 1507   UROBILINOGEN 0.2 08/30/2014 2203   NITRITE NEGATIVE 04/18/2018 1507   LEUKOCYTESUR LARGE (A) 04/18/2018 1507    Radiological Exams on Admission: Dg Chest Port 1 View  Result Date: 05/03/2018 CLINICAL DATA:  Weakness with fever. EXAM: PORTABLE CHEST 1 VIEW COMPARISON:  04/18/2018. FINDINGS: Borderline cardiac enlargement. LEFT pleural effusion appears increased. Slight retrocardiac density may be persistent. Streaky BILATERAL interstitial markings represent emphysematous change particularly at the lung apices. No definite consolidation. IMPRESSION: 1. Increasing LEFT pleural effusion. Electronically Signed   By: Staci Righter M.D.   On: 05/03/2018 15:31    EKG: Independently reviewed.  Assessment/Plan Principal Problem:   Fever Active Problems:   Multiple sclerosis (HCC)   Sacral decubitus ulcer   Progressive anemia   Spastic quadriplegia (HCC)   DM (diabetes mellitus) type II controlled with renal manifestation (HCC)   ESRD on hemodialysis (HCC)   Symptomatic anemia    1. Fever - 1. Influenza PCR pending 2. Will leave on cefepime and vanc for now 3. MRI pelvis pending to look for osteomyelitis 4. ESR 5. CRP 6. BCx 7. UCx (UA always looks infected chronically apparently according to prior notes) 2. Symptomatic anemia - progressive anemia of chronic  disease - 1. Transfuse 2u PRBC 2. Repeat CBC in AM 3. ESRD - 1. Repeat BMP in AM 2. Call nephrology in AM, last dialysis was Monday due to holiday (usually on a TTS schedule) 4. MS - 1. Bed bound 2. Takes Baclofen despite ESRD 5. DM - 1. Cont Levemir 5u 2. Sensitive SSI  6. Sacral decubitus - 1. MRI pelvis as above 2. Wound care consult 3. ESR and CRP 7. HTN - 1. Hold home coreg 3.125 mg BID 2. takes Midodrine 48m with dialysis  DVT prophylaxis: SCDs Code Status: DNR Family Communication: No family in room Disposition Plan: TBD Consults called: None Admission status: Admit to inpatient  Severity of Illness: The appropriate patient status for this patient is INPATIENT. Inpatient status is judged to be reasonable and necessary in order to provide the required intensity of service to ensure the patient's safety. The patient's presenting symptoms, physical exam findings, and initial radiographic and laboratory data in the context of their chronic comorbidities is felt to place them at high risk for further clinical deterioration. Furthermore, it is not anticipated that the patient will be medically stable for discharge from the hospital within 2 midnights of admission. The following factors support the patient status of inpatient.   " The patient's presenting symptoms include Fever, weakness. " The worrisome physical exam findings include Stage 4 decubitus ulcers. " The initial radiographic and laboratory data are worrisome because of HGB 5.8. " The chronic co-morbidities include MS, spastic quadriplegia, ESRD.   * I certify that at the point of admission it is my clinical judgment that the patient will require inpatient hospital care spanning beyond 2 midnights from the point of admission due to high intensity of service, high risk for further deterioration and high frequency of surveillance required.*Etta QuillDO [facility] Pager [phone] works  nights!  If 7AM-7PM, please contact the primary day team physician taking care of patient  [email or web address] Password [email or web address] 05/03/2018, 7:37 PM

## 2018-05-04 ENCOUNTER — Inpatient Hospital Stay (HOSPITAL_COMMUNITY): Payer: Medicare Other

## 2018-05-04 DIAGNOSIS — B9562 Methicillin resistant Staphylococcus aureus infection as the cause of diseases classified elsewhere: Secondary | ICD-10-CM

## 2018-05-04 DIAGNOSIS — Z87891 Personal history of nicotine dependence: Secondary | ICD-10-CM

## 2018-05-04 DIAGNOSIS — Z960 Presence of urogenital implants: Secondary | ICD-10-CM

## 2018-05-04 DIAGNOSIS — Z87440 Personal history of urinary (tract) infections: Secondary | ICD-10-CM

## 2018-05-04 DIAGNOSIS — R5081 Fever presenting with conditions classified elsewhere: Secondary | ICD-10-CM

## 2018-05-04 DIAGNOSIS — Z992 Dependence on renal dialysis: Secondary | ICD-10-CM

## 2018-05-04 DIAGNOSIS — N186 End stage renal disease: Secondary | ICD-10-CM

## 2018-05-04 DIAGNOSIS — G8220 Paraplegia, unspecified: Secondary | ICD-10-CM

## 2018-05-04 DIAGNOSIS — R7881 Bacteremia: Secondary | ICD-10-CM

## 2018-05-04 DIAGNOSIS — Z9889 Other specified postprocedural states: Secondary | ICD-10-CM

## 2018-05-04 DIAGNOSIS — G35 Multiple sclerosis: Secondary | ICD-10-CM

## 2018-05-04 DIAGNOSIS — D649 Anemia, unspecified: Secondary | ICD-10-CM

## 2018-05-04 DIAGNOSIS — L89159 Pressure ulcer of sacral region, unspecified stage: Principal | ICD-10-CM

## 2018-05-04 LAB — FOLATE: Folate: 8.8 ng/mL (ref >5.9)

## 2018-05-04 LAB — BASIC METABOLIC PANEL
Anion gap: 14 (ref 5–15)
BUN: 41 mg/dL — ABNORMAL HIGH (ref 8–23)
CALCIUM: 8.3 mg/dL — AB (ref 8.9–10.3)
CO2: 24 mmol/L (ref 22–32)
CREATININE: 2.97 mg/dL — AB (ref 0.61–1.24)
Chloride: 98 mmol/L (ref 98–111)
GFR calc Af Amer: 22 mL/min — ABNORMAL LOW (ref >60)
GFR calc non Af Amer: 19 mL/min — ABNORMAL LOW (ref >60)
Glucose, Bld: 203 mg/dL — ABNORMAL HIGH (ref 70–99)
Potassium: 4.3 mmol/L (ref 3.5–5.1)
Sodium: 136 mmol/L (ref 135–145)

## 2018-05-04 LAB — HEPATIC FUNCTION PANEL
ALBUMIN: 1.5 g/dL — AB (ref 3.5–5.0)
ALT: 12 U/L (ref 0–44)
AST: 22 U/L (ref 15–41)
Alkaline Phosphatase: 62 U/L (ref 38–126)
Bilirubin, Direct: 0.2 mg/dL (ref 0.0–0.2)
Indirect Bilirubin: 0.3 mg/dL (ref 0.3–0.9)
Total Bilirubin: 0.5 mg/dL (ref 0.3–1.2)
Total Protein: 6.8 g/dL (ref 6.5–8.1)

## 2018-05-04 LAB — RENAL FUNCTION PANEL
Albumin: 1.6 g/dL — ABNORMAL LOW (ref 3.5–5.0)
Anion gap: 12 (ref 5–15)
BUN: 57 mg/dL — ABNORMAL HIGH (ref 8–23)
CO2: 24 mmol/L (ref 22–32)
Calcium: 8.4 mg/dL — ABNORMAL LOW (ref 8.9–10.3)
Chloride: 98 mmol/L (ref 98–111)
Creatinine, Ser: 3.09 mg/dL — ABNORMAL HIGH (ref 0.61–1.24)
GFR calc Af Amer: 21 mL/min — ABNORMAL LOW (ref >60)
GFR calc non Af Amer: 18 mL/min — ABNORMAL LOW (ref >60)
Glucose, Bld: 139 mg/dL — ABNORMAL HIGH (ref 70–99)
Phosphorus: 3 mg/dL (ref 2.5–4.6)
Potassium: 4.3 mmol/L (ref 3.5–5.1)
Sodium: 134 mmol/L — ABNORMAL LOW (ref 135–145)

## 2018-05-04 LAB — BLOOD CULTURE ID PANEL (REFLEXED)
Acinetobacter baumannii: NOT DETECTED
CANDIDA PARAPSILOSIS: NOT DETECTED
Candida albicans: NOT DETECTED
Candida glabrata: NOT DETECTED
Candida krusei: NOT DETECTED
Candida tropicalis: NOT DETECTED
Enterobacter cloacae complex: NOT DETECTED
Enterobacteriaceae species: NOT DETECTED
Enterococcus species: NOT DETECTED
Escherichia coli: NOT DETECTED
HAEMOPHILUS INFLUENZAE: NOT DETECTED
Klebsiella oxytoca: NOT DETECTED
Klebsiella pneumoniae: NOT DETECTED
Listeria monocytogenes: NOT DETECTED
Methicillin resistance: DETECTED — AB
Neisseria meningitidis: NOT DETECTED
PROTEUS SPECIES: NOT DETECTED
Pseudomonas aeruginosa: NOT DETECTED
Serratia marcescens: NOT DETECTED
Staphylococcus aureus (BCID): DETECTED — AB
Staphylococcus species: DETECTED — AB
Streptococcus agalactiae: NOT DETECTED
Streptococcus pneumoniae: NOT DETECTED
Streptococcus pyogenes: NOT DETECTED
Streptococcus species: NOT DETECTED

## 2018-05-04 LAB — CBC
HCT: 27.5 % — ABNORMAL LOW (ref 39.0–52.0)
HCT: 29 % — ABNORMAL LOW (ref 39.0–52.0)
Hemoglobin: 8.5 g/dL — ABNORMAL LOW (ref 13.0–17.0)
Hemoglobin: 8.9 g/dL — ABNORMAL LOW (ref 13.0–17.0)
MCH: 30.9 pg (ref 26.0–34.0)
MCH: 29.7 pg (ref 26.0–34.0)
MCHC: 30.9 g/dL (ref 30.0–36.0)
MCHC: 30.7 g/dL (ref 30.0–36.0)
MCV: 100 fL (ref 80.0–100.0)
MCV: 96.7 fL (ref 80.0–100.0)
Platelets: 164 10*3/uL (ref 150–400)
Platelets: 146 10*3/uL — ABNORMAL LOW (ref 150–400)
RBC: 2.75 MIL/uL — ABNORMAL LOW (ref 4.22–5.81)
RBC: 3 MIL/uL — ABNORMAL LOW (ref 4.22–5.81)
RDW: 17.8 % — ABNORMAL HIGH (ref 11.5–15.5)
RDW: 17.6 % — ABNORMAL HIGH (ref 11.5–15.5)
WBC: 11.6 10*3/uL — ABNORMAL HIGH (ref 4.0–10.5)
WBC: 12 10*3/uL — ABNORMAL HIGH (ref 4.0–10.5)
nRBC: 0 % (ref 0.0–0.2)
nRBC: 0 % (ref 0.0–0.2)

## 2018-05-04 LAB — TYPE AND SCREEN
ABO/RH(D): A POS
Antibody Screen: NEGATIVE
Unit division: 0
Unit division: 0

## 2018-05-04 LAB — IRON AND TIBC: Iron: 24 ug/dL — ABNORMAL LOW (ref 45–182)

## 2018-05-04 LAB — GLUCOSE, CAPILLARY
GLUCOSE-CAPILLARY: 125 mg/dL — AB (ref 70–99)
Glucose-Capillary: 82 mg/dL (ref 70–99)

## 2018-05-04 LAB — BPAM RBC
Blood Product Expiration Date: 202001162359
Blood Product Expiration Date: 202001272359
ISSUE DATE / TIME: 201912311838
ISSUE DATE / TIME: 201912312200
UNIT TYPE AND RH: 6200
Unit Type and Rh: 6200

## 2018-05-04 LAB — MRSA PCR SCREENING: MRSA by PCR: NEGATIVE

## 2018-05-04 LAB — VITAMIN B12: Vitamin B-12: 914 pg/mL (ref 180–914)

## 2018-05-04 LAB — SEDIMENTATION RATE: SED RATE: 127 mm/h — AB (ref 0–16)

## 2018-05-04 LAB — FERRITIN: Ferritin: >1500 ng/mL — ABNORMAL HIGH (ref 24–336)

## 2018-05-04 LAB — C-REACTIVE PROTEIN: CRP: 23.9 mg/dL — ABNORMAL HIGH (ref <1.0)

## 2018-05-04 MED ORDER — NEPRO/CARBSTEADY PO LIQD
237.0000 mL | Freq: Two times a day (BID) | ORAL | Status: DC
  Administered 2018-05-04 – 2018-05-06 (×4): 237 mL via ORAL

## 2018-05-04 MED ORDER — LIDOCAINE HCL (PF) 1 % IJ SOLN
5.0000 mL | INTRAMUSCULAR | Status: DC | PRN
  Filled 2018-05-04: qty 5

## 2018-05-04 MED ORDER — RENA-VITE PO TABS: 1.0000 | ORAL_TABLET | Freq: Every day | ORAL | Status: DC

## 2018-05-04 MED ORDER — CHLORHEXIDINE GLUCONATE CLOTH 2 % EX PADS
6.0000 | MEDICATED_PAD | Freq: Every day | CUTANEOUS | Status: DC
  Administered 2018-05-05: 6 via TOPICAL

## 2018-05-04 MED ORDER — SODIUM CHLORIDE 0.9 % IV SOLN: 100.0000 mL | INTRAVENOUS | Status: DC | PRN

## 2018-05-04 MED ORDER — LIDOCAINE-PRILOCAINE 2.5-2.5 % EX CREA
1.0000 "application " | TOPICAL_CREAM | CUTANEOUS | Status: DC | PRN
  Filled 2018-05-04: qty 5

## 2018-05-04 MED ORDER — PENTAFLUOROPROP-TETRAFLUOROETH EX AERO: 1.0000 "application " | INHALATION_SPRAY | CUTANEOUS | Status: DC | PRN

## 2018-05-04 MED ORDER — PRO-STAT SUGAR FREE PO LIQD
30.0000 mL | Freq: Two times a day (BID) | ORAL | Status: DC
  Administered 2018-05-04 – 2018-05-05 (×2): 30 mL via ORAL

## 2018-05-04 MED ORDER — DOXERCALCIFEROL 4 MCG/2ML IV SOLN
1.0000 ug | INTRAVENOUS | Status: DC
  Filled 2018-05-04: qty 2

## 2018-05-04 MED ORDER — ALTEPLASE 2 MG IJ SOLR
2.0000 mg | Freq: Once | INTRAMUSCULAR | Status: DC | PRN
  Filled 2018-05-04: qty 2

## 2018-05-04 MED ORDER — VANCOMYCIN HCL IN DEXTROSE 750-5 MG/150ML-% IV SOLN
750.0000 mg | INTRAVENOUS | Status: DC
  Administered 2018-05-05: 750 mg via INTRAVENOUS
  Filled 2018-05-04: qty 150

## 2018-05-04 NOTE — Social Work (Signed)
CSW acknowledging consult for SNF placement. Will follow for therapy recommendations. Aware of complex home situation.   Westley Hummer, MSW, [place] Work [phone]

## 2018-05-04 NOTE — ED Notes (Signed)
Ordered breakfast tray, Diet renal carb mod, liquid rest. 1200 ml

## 2018-05-04 NOTE — ED Notes (Signed)
Pt ate 25% of meal - 1/2 of oatmeal and a few bites of french toast. Had 8oz of water

## 2018-05-04 NOTE — Progress Notes (Signed)
Pharmacy Antibiotic Note  Lance [name] is a 78 y.o. male admitted on 05/03/2018 with bacteremia.  Pharmacy has been consulted for vancomycin dosing.  Plan: Vancomycin 750 mg IV every TTS post-HD F/u HD schedule Monitor clinical progress, cultures/sensitivities, HD schedule, abx plan Vancomycin level as indicated   Weight: 160 lb (72.6 kg)(estimate for antibiotic dosing)  Temp (24hrs), Avg:97.9 F (36.6 C), Min:97.4 F (36.3 C), Max:99.2 F (37.3 C)  Recent Labs  Lab 05/03/18 1536 05/03/18 1552 05/03/18 1828 05/04/18 0430  WBC 10.3  --   --  11.6*  CREATININE 2.45*  --   --  2.97*  LATICACIDVEN  --  1.52 0.96  --     Estimated Creatinine Clearance: 21.4 mL/min (A) (by C-G formula based on SCr of 2.97 mg/dL (H)).    No Known Allergies  Antimicrobials this admission: Vanc 12/31> Cefepime 12/31>1/1  Dose adjustments this admission:  Microbiology results: 12/31 UCx >>sent 12/31 BCx >> 2/2 MRSA 12/31 Flu PCR    Thank you for allowing [place] to participate in this patients care.   Jens Som, PharmD Please utilize [employer] (under [place]) for appropriate number for your unit pharmacist. 05/04/2018 2:53 PM

## 2018-05-04 NOTE — ED Notes (Addendum)
Attempted calling report x 1

## 2018-05-04 NOTE — ED Notes (Signed)
ED TO INPATIENT HANDOFF REPORT  ED Nurse Name and Phone #: Myriam Jacobson  Name/Age/Gender Sol Blazing Bachmann 78 y.o. male Room/Bed: 034C/034C  Code Status   Code Status: DNR  Home/SNF/Other Nursing Home Patient oriented to: self, place, time and situation Is this baseline? Yes   Triage Complete: Triage complete   Chief Complaint sick  Triage Note Per EMS- pt has 3 stage 4 wounds to his sacrum as well as one to each foot. Pt was seen by his home health nurse yesterday and was afebrile but feeling weak. Pt was then found to have a fever of 102 today and was given 1000mg  of tylenol prior to transport. Pt has hx of MS but is alert and oriented. Pt has a foley upon arrival as well.   Pt is a dialysis pt with a T/Th/S schedule. Pt has graft to rt arm. Pt received last treatment yesterday due to the holiday. Pt also received routine blood transfusions and is scheduled for that on Friday.    Allergies No Known Allergies  Level of Care/Admitting Diagnosis ED Disposition    ED Disposition Condition [facility] Area: [place] [[id number]]  Level of Care: Medical Telemetry [104]  Diagnosis: Symptomatic anemia [[id number]]  Admitting Physician: Doreatha Massed  Attending Physician: Etta Quill [phone]  Estimated length of stay: past midnight tomorrow  Certification:: I certify this patient will need inpatient services for at least 2 midnights  PT Class (Do Not Modify): Inpatient [101]  PT Acc Code (Do Not Modify): Private [1]       Medical/Surgery History Past Medical History:  Diagnosis Date  . Acute CHF (congestive heart failure) ([place]) 02/14/2016  . Acute osteomyelitis, pelvis, right ([place])   . Acute renal failure ([place])   . Anemia in chronic renal disease   . Blood transfusion   . Cardiomyopathy ([place]) 02/17/2016  . Chronic spastic paraplegia    secondary to MS  . Decreased sensation of lower extremity    due to MS  . Decubitus ulcer of  ischium   . Decubitus ulcer of trochanter, stage 4 ([place]) 06/14/2015  . ESRD (end stage renal disease) ([place])    [place]  . History of kidney stones    x2  . History of MRSA infection    urine  . History of recurrent UTIs   . Hypertension   . MS (multiple sclerosis) ([place])   . Multiple sclerosis ([place])    stated by client in triage   . Neuralgia neuritis, sciatic nerve    MS for 30 years  . Neurogenic bladder   . PONV (postoperative nausea and vomiting)   . Presence of indwelling urinary catheter   . Self-catheterizes urinary bladder   . Type 2 diabetes mellitus ([place])    Past Surgical History:  Procedure Laterality Date  . AMPUTATION Left 01/22/2017   Procedure: Left 5th Ray Amputation;  Surgeon: Newt Minion, MD;  Location: [facility];  Service: Orthopedics;  Laterality: Left;  . APPLICATION OF A-CELL OF EXTREMITY Left 09/05/2014   Procedure: PLACEMENT OF ACELL AND VAC;  Surgeon: Theodoro Kos, DO;  Location: [facility];  Service: Plastics;  Laterality: Left;  . BASCILIC VEIN TRANSPOSITION Right 03/24/2016   Procedure: RADIAL- CEPHALIC FISTULA CREATION RIGHT ARM;  Surgeon: Conrad Meadow, MD;  Location: [facility];  Service: Vascular;  Laterality: Right;  . Buttocks flap Left   . COLONOSCOPY    . EXTRACORPOREAL SHOCK WAVE LITHOTRIPSY  03/23/2011   x2  . FINGER SURGERY  2004  . hip flap Right   . INCISION AND DRAINAGE OF WOUND Left 09/05/2014   Procedure: IRRIGATION AND DEBRIDEMENT OF LEFT ISCHIUM WOUND WITH ;  Surgeon: Theodoro Kos, DO;  Location: [facility];  Service: Plastics;  Laterality: Left;  . INSERTION OF DIALYSIS CATHETER N/A 02/27/2016   Procedure: INSERTION OF DIALYSIS CATHETER-RIGHT INTERNAL JUGULA PLACEMENT;  Surgeon: Conrad Armington, MD;  Location: [facility];  Service: Vascular;  Laterality: N/A;  . TONSILLECTOMY    . WOUND DEBRIDEMENT     [facility]2/[facility]      IV Location/Drains/Wounds Patient Lines/Drains/Airways Status   Active Line/Drains/Airways    Name:   Placement  date:   Placement time:   Site:   Days:   Peripheral IV 05/03/18 Left Forearm   05/03/18    -    Forearm   1   Peripheral IV 05/03/18 Left Forearm   05/03/18    1550    Forearm   1   Fistula / Graft Right Forearm Arteriovenous fistula   -    -    Forearm      Negative Pressure Wound Therapy Buttocks Posterior;Right   03/19/17    -    -   411   Urethral Catheter    09/03/17    1000    -   243   Incision (Closed) 02/27/16 Neck Right   02/27/16    1047     797   Incision (Closed) 02/27/16 Chest Right   02/27/16    1047     797   Incision (Closed) 01/22/17 Foot Left   01/22/17    0739     467   Pressure Injury 01/30/16 Unstageable - Full thickness tissue loss in which the base of the ulcer is covered by slough (yellow, tan, gray, green or brown) and/or eschar (tan, brown or black) in the wound bed.   01/30/16    1556     825   Pressure Injury 05/10/16 Stage II -  Partial thickness loss of dermis presenting as a shallow open ulcer with a red, pink wound bed without slough. nickel sized   05/10/16    1945     724   Pressure Injury 05/10/16 Stage III -  Full thickness tissue loss. Subcutaneous fat may be visible but bone, tendon or muscle are NOT exposed. silver dollar sized with undermining   05/10/16    1945     724   Pressure Injury 07/03/16 Deep Tissue Injury - Purple or maroon localized area of discolored intact skin or blood-filled blister due to damage of underlying soft tissue from pressure and/or shear.   07/03/16    1208     670   Pressure Injury 07/03/16 Stage IV - Full thickness tissue loss with exposed bone, tendon or muscle.   07/03/16    1208     670   Pressure Injury 07/03/16 Stage III -  Full thickness tissue loss. Subcutaneous fat may be visible but bone, tendon or muscle are NOT exposed.   07/03/16    1208     670   Pressure Injury 03/18/17 Stage I -  Intact skin with non-blanchable redness of a localized area usually over a bony prominence.   03/18/17    1809     412   Pressure Injury  03/18/17 Stage II -  Partial thickness loss of dermis presenting as a shallow open ulcer with  a red, pink wound bed without slough.   03/18/17    1810     412   Pressure Injury 03/18/17 Stage IV - Full thickness tissue loss with exposed bone, tendon or muscle. with vac dressing from home   03/18/17    1813     412   Pressure Injury 09/04/17 Stage III -  Full thickness tissue loss. Subcutaneous fat may be visible but bone, tendon or muscle are NOT exposed. right ischial wound    09/04/17    2101     242   Pressure Injury 09/05/17 Stage II -  Partial thickness loss of dermis presenting as a shallow open ulcer with a red, pink wound bed without slough.   09/05/17    0745     241   Pressure Injury 03/05/18 Stage II -  Partial thickness loss of dermis presenting as a shallow open ulcer with a red, pink wound bed without slough.   03/05/18    1849     60   Pressure Injury 03/05/18 Stage IV - Full thickness tissue loss with exposed bone, tendon or muscle.   03/05/18    2121     60   Pressure Injury 03/05/18 Unstageable - Full thickness tissue loss in which the base of the ulcer is covered by slough (yellow, tan, gray, green or brown) and/or eschar (tan, brown or black) in the wound bed.   03/05/18    2121     60   Pressure Injury 03/06/18 Unstageable - Full thickness tissue loss in which the base of the ulcer is covered by slough (yellow, tan, gray, green or brown) and/or eschar (tan, brown or black) in the wound bed. eschar   03/06/18    1100     59   Pressure Injury 03/06/18 Stage IV - Full thickness tissue loss with exposed bone, tendon or muscle.   03/06/18    -     59   Wound / Incision (Open or Dehisced) 01/22/17 Other (Comment) Buttocks Right Stage 4 decubitus per patient, pink wound bed with brown/yellow/clear drainage   01/22/17    0702    Buttocks   467   Wound / Incision (Open or Dehisced) 09/06/17 Non-pressure wound Leg Left full thickness chronic wound to left outer calf   09/06/17    -    Leg   240           Intake/Output Last 24 hours  Intake/Output Summary (Last 24 hours) at 05/04/2018 1114 Last data filed at 05/04/2018 0426 Gross per 24 hour  Intake 1170 ml  Output -  Net 1170 ml    Labs/Imaging Results for orders placed or performed during the hospital encounter of 05/03/18 (from the past 48 hour(s))  Comprehensive metabolic panel     Status: Abnormal   Collection Time: 05/03/18  3:36 PM  Result Value Ref Range   Sodium 136 135 - 145 mmol/L   Potassium 3.7 3.5 - 5.1 mmol/L   Chloride 97 (L) 98 - 111 mmol/L   CO2 27 22 - 32 mmol/L   Glucose, Bld 114 (H) 70 - 99 mg/dL   BUN 29 (H) 8 - 23 mg/dL   Creatinine, Ser 2.45 (H) 0.61 - 1.24 mg/dL   Calcium 7.9 (L) 8.9 - 10.3 mg/dL   Total Protein 6.5 6.5 - 8.1 g/dL   Albumin 1.5 (L) 3.5 - 5.0 g/dL   AST 17 15 - 41 U/L   ALT 10 0 -  44 U/L   Alkaline Phosphatase 62 38 - 126 U/L   Total Bilirubin 0.6 0.3 - 1.2 mg/dL   GFR calc non Af Amer 24 (L) >60 mL/min   GFR calc Af Amer 28 (L) >60 mL/min   Anion gap 12 5 - 15    Comment: Performed at [facility] [address] [place], [place] [address]  CBC WITH DIFFERENTIAL     Status: Abnormal   Collection Time: 05/03/18  3:36 PM  Result Value Ref Range   WBC 10.3 4.0 - 10.5 K/uL   RBC 1.95 (L) 4.22 - 5.81 MIL/uL   Hemoglobin 5.8 (LL) 13.0 - 17.0 g/dL    Comment: REPEATED TO VERIFY THIS CRITICAL RESULT HAS VERIFIED AND BEEN CALLED TO PATTI PEREZ,RN BY ZELDA BEECH ON 12 31 2019 AT 1630, AND HAS BEEN READ BACK.  THIS CRITICAL RESULT HAS VERIFIED AND BEEN CALLED TO PATTI PEREZ,RN BY ZELDA BEECH ON 12 31 2019 AT 1635, AND HAS BEEN READ BACK.     HCT 19.7 (L) 39.0 - 52.0 %   MCV 101.0 (H) 80.0 - 100.0 fL   MCH 29.7 26.0 - 34.0 pg   MCHC 29.4 (L) 30.0 - 36.0 g/dL   RDW 18.1 (H) 11.5 - 15.5 %   Platelets 146 (L) 150 - 400 K/uL   nRBC 0.0 0.0 - 0.2 %   Neutrophils Relative % 87 %   Neutro Abs 8.9 (H) 1.7 - 7.7 K/uL   Lymphocytes Relative 6 %   Lymphs Abs 0.7 0.7 - 4.0 K/uL    Monocytes Relative 6 %   Monocytes Absolute 0.6 0.1 - 1.0 K/uL   Eosinophils Relative 0 %   Eosinophils Absolute 0.0 0.0 - 0.5 K/uL   Basophils Relative 0 %   Basophils Absolute 0.0 0.0 - 0.1 K/uL   Smear Review MORPHOLOGY UNREMARKABLE    Immature Granulocytes 1 %   Abs Immature Granulocytes 0.08 (H) 0.00 - 0.07 K/uL    Comment: Performed at [facility], [address] [place], [place] [address]  Type and screen [place]     Status: None (Preliminary result)   Collection Time: 05/03/18  3:36 PM  Result Value Ref Range   ABO/RH(D) A POS    Antibody Screen NEG    Sample Expiration 05/06/2018    Unit Number [id number]    Blood Component Type RED CELLS,LR    Unit division 00    Status of Unit ISSUED    Transfusion Status OK TO TRANSFUSE    Crossmatch Result Compatible    Unit Number [id number]    Blood Component Type RED CELLS,LR    Unit division 00    Status of Unit ISSUED    Transfusion Status OK TO TRANSFUSE    Crossmatch Result      Compatible Performed at [facility], [place] [address], [place], [place] [address]   Blood Culture (routine x 2)     Status: None (Preliminary result)   Collection Time: 05/03/18  3:37 PM  Result Value Ref Range   Specimen Description BLOOD LEFT HAND    Special Requests      BOTTLES DRAWN AEROBIC AND ANAEROBIC Blood Culture adequate volume   Culture  Setup Time      GRAM POSITIVE COCCI IN CLUSTERS AEROBIC BOTTLE ONLY Performed at [facility], [place] [address] [place], [place] [address]    Culture GRAM POSITIVE COCCI    Report Status PENDING  Blood Culture (routine x 2)     Status: None (Preliminary result)   Collection Time: 05/03/18  3:50 PM  Result Value Ref Range   Specimen Description BLOOD LEFT FOREARM    Special Requests      BOTTLES DRAWN AEROBIC AND ANAEROBIC Blood Culture results may not be optimal due to an excessive volume of blood received in culture bottles   Culture  Setup Time       GRAM POSITIVE COCCI IN BOTH AEROBIC AND ANAEROBIC BOTTLES Organism ID to follow CRITICAL RESULT CALLED TO, READ BACK BY AND VERIFIED WITH: Drema Dallas PHARMD, AT 1004 05/04/18 BY D. VANHOOK Performed at [facility], [place] [address] [place], [place] [address]    Culture GRAM POSITIVE COCCI    Report Status PENDING   Blood Culture ID Panel (Reflexed)     Status: Abnormal   Collection Time: 05/03/18  3:50 PM  Result Value Ref Range   Enterococcus species NOT DETECTED NOT DETECTED   Listeria monocytogenes NOT DETECTED NOT DETECTED   Staphylococcus species DETECTED (A) NOT DETECTED    Comment: CRITICAL RESULT CALLED TO, READ BACK BY AND VERIFIED WITH: J. TESSIN PHARMD, AT 1004 05/04/18 BY D. VANHOOK    Staphylococcus aureus (BCID) DETECTED (A) NOT DETECTED    Comment: Methicillin (oxacillin)-resistant Staphylococcus aureus (MRSA). MRSA is predictably resistant to beta-lactam antibiotics (except ceftaroline). Preferred therapy is vancomycin unless clinically contraindicated. Patient requires contact precautions if  hospitalized. CRITICAL RESULT CALLED TO, READ BACK BY AND VERIFIED WITH: J. TESSIN PHARMD, AT 1004 05/04/18 BY D. VANHOOK    Methicillin resistance DETECTED (A) NOT DETECTED    Comment: CRITICAL RESULT CALLED TO, READ BACK BY AND VERIFIED WITH: J. TESSIN PHARMD, AT 1004 05/04/18 BY D. VANHOOK    Streptococcus species NOT DETECTED NOT DETECTED   Streptococcus agalactiae NOT DETECTED NOT DETECTED   Streptococcus pneumoniae NOT DETECTED NOT DETECTED   Streptococcus pyogenes NOT DETECTED NOT DETECTED   Acinetobacter baumannii NOT DETECTED NOT DETECTED   Enterobacteriaceae species NOT DETECTED NOT DETECTED   Enterobacter cloacae complex NOT DETECTED NOT DETECTED   Escherichia coli NOT DETECTED NOT DETECTED   Klebsiella oxytoca NOT DETECTED NOT DETECTED   Klebsiella pneumoniae NOT DETECTED NOT DETECTED   Proteus species NOT DETECTED NOT DETECTED   Serratia marcescens NOT  DETECTED NOT DETECTED   Haemophilus influenzae NOT DETECTED NOT DETECTED   Neisseria meningitidis NOT DETECTED NOT DETECTED   Pseudomonas aeruginosa NOT DETECTED NOT DETECTED   Candida albicans NOT DETECTED NOT DETECTED   Candida glabrata NOT DETECTED NOT DETECTED   Candida krusei NOT DETECTED NOT DETECTED   Candida parapsilosis NOT DETECTED NOT DETECTED   Candida tropicalis NOT DETECTED NOT DETECTED    Comment: Performed at [facility], [address], [place], [place] [address]  I-Stat CG4 Lactic Acid, ED     Status: None   Collection Time: 05/03/18  3:52 PM  Result Value Ref Range   Lactic Acid, Venous 1.52 0.5 - 1.9 mmol/L  POC occult blood, ED Provider will collect     Status: None   Collection Time: 05/03/18  5:06 PM  Result Value Ref Range   Fecal Occult Bld NEGATIVE NEGATIVE  Prepare RBC     Status: None   Collection Time: 05/03/18  6:00 PM  Result Value Ref Range   Order Confirmation      ORDER PROCESSED BY BLOOD BANK Performed at [facility], [place] [address] [place], [place] [address]  Influenza panel by PCR (type A & B)     Status: None   Collection Time: 05/03/18  6:15 PM  Result Value Ref Range   Influenza A By PCR NEGATIVE NEGATIVE   Influenza B By PCR NEGATIVE NEGATIVE    Comment: (NOTE) The Xpert Xpress Flu assay is intended as an aid in the diagnosis of  influenza and should not be used as a sole basis for treatment.  This  assay is FDA approved for nasopharyngeal swab specimens only. Nasal  washings and aspirates are unacceptable for Xpert Xpress Flu testing. Performed at [facility], [place] [address] [place], [place] [address]   I-Stat CG4 Lactic Acid, ED     Status: None   Collection Time: 05/03/18  6:28 PM  Result Value Ref Range   Lactic Acid, Venous 0.96 0.5 - 1.9 mmol/L  Urinalysis, Routine w reflex microscopic     Status: Abnormal   Collection Time: 05/03/18  7:35 PM  Result Value Ref Range   Color, Urine AMBER (A) YELLOW     Comment: BIOCHEMICALS MAY BE AFFECTED BY COLOR   APPearance CLOUDY (A) CLEAR   Specific Gravity, Urine 1.015 1.005 - 1.030   pH 7.0 5.0 - 8.0   Glucose, UA NEGATIVE NEGATIVE mg/dL   Hgb urine dipstick LARGE (A) NEGATIVE   Bilirubin Urine NEGATIVE NEGATIVE   Ketones, ur 5 (A) NEGATIVE mg/dL   Protein, ur 100 (A) NEGATIVE mg/dL   Nitrite NEGATIVE NEGATIVE   Leukocytes, UA MODERATE (A) NEGATIVE   RBC / HPF >50 (H) 0 - 5 RBC/hpf   WBC, UA >50 (H) 0 - 5 WBC/hpf   Bacteria, UA MANY (A) NONE SEEN   Squamous Epithelial / LPF 0-5 0 - 5   WBC Clumps PRESENT     Comment: Performed at [facility], [address], [place], [place] [address]  CBG monitoring, ED     Status: Abnormal   Collection Time: 05/03/18 10:21 PM  Result Value Ref Range   Glucose-Capillary 189 (H) 70 - 99 mg/dL  Sedimentation rate     Status: Abnormal   Collection Time: 05/04/18  4:30 AM  Result Value Ref Range   Sed Rate 127 (H) 0 - 16 mm/hr    Comment: Performed at [facility], [place] [address], [place], [place] [address]  C-reactive protein     Status: Abnormal   Collection Time: 05/04/18  4:30 AM  Result Value Ref Range   CRP 23.9 (H) <1.0 mg/dL    Comment: Performed at [facility] [address], [place], [place] [address]  CBC     Status: Abnormal   Collection Time: 05/04/18  4:30 AM  Result Value Ref Range   WBC 11.6 (H) 4.0 - 10.5 K/uL   RBC 2.75 (L) 4.22 - 5.81 MIL/uL   Hemoglobin 8.5 (L) 13.0 - 17.0 g/dL    Comment: REPEATED TO VERIFY POST TRANSFUSION SPECIMEN    HCT 27.5 (L) 39.0 - 52.0 %   MCV 100.0 80.0 - 100.0 fL   MCH 30.9 26.0 - 34.0 pg   MCHC 30.9 30.0 - 36.0 g/dL   RDW 17.8 (H) 11.5 - 15.5 %   Platelets 164 150 - 400 K/uL   nRBC 0.0 0.0 - 0.2 %    Comment: Performed at [facility], [place] [address], [place], [place] [address]  Basic metabolic panel     Status: Abnormal   Collection Time: 05/04/18  4:30 AM  Result Value Ref Range   Sodium 136 135 - 145 mmol/L    Potassium 4.3 3.5 - 5.1 mmol/L   Chloride 98 98 - 111 mmol/L   CO2 24 22 - 32 mmol/L   Glucose, Bld 203 (H) 70 - 99 mg/dL   BUN 41 (H) 8 - 23 mg/dL   Creatinine, Ser 2.97 (H) 0.61 - 1.24 mg/dL   Calcium 8.3 (L) 8.9 - 10.3 mg/dL   GFR calc non Af Amer 19 (L) >60 mL/min   GFR calc Af Amer 22 (L) >60 mL/min   Anion gap 14 5 - 15    Comment: Performed at [facility] [address], [place], [place] [address]   Mr Pelvis Wo Contrast  Result Date: 05/04/2018 CLINICAL DATA:  Pressure ulcer. EXAM: MRI PELVIS WITHOUT CONTRAST TECHNIQUE: Multiplanar multisequence MR imaging of the pelvis was performed. No intravenous contrast was administered. COMPARISON:  MRI dated 02/14/2016 FINDINGS: Musculoskeletal: There appears to be a mass destroying the S2 segment of the sacrum and extending into the sacral spinal canal. This was not present on the prior study. Does the patient have a history of malignancy? If not, plasmocytoma should be considered. The sacrum appears to have been resected below the third sacral segment. Prominent sacral decubitus ulcer is just below the third sacral segment. No discrete osteomyelitis. There is new diffuse mottling of the bone marrow of the pelvic bones remnants of the sacrum. Urinary Tract:  Foley catheter in place.  Thickened bladder wall. Bowel:  Unremarkable visualized pelvic bowel loops. Vascular/Lymphatic: No pathologically enlarged lymph nodes. No significant vascular abnormality seen. Reproductive:  No mass or other significant abnormality Other: There is a diffuse edema within the muscles of the pelvis and buttocks. IMPRESSION: 1. New mass destroying the second sacral segment and extending into the sacral spinal canal. This is consistent with tumor. Does the patient have any known malignancy? If not, the possibility of plasmacytoma should be considered. 2. Deep sacral decubitus ulcer with previous resection of the distal sacrum and coccyx. No findings suggestive of  current osteomyelitis. Electronically Signed   By: Lorriane Shire M.D.   On: 05/04/2018 10:37   Dg Chest Port 1 View  Result Date: 05/03/2018 CLINICAL DATA:  Weakness with fever. EXAM: PORTABLE CHEST 1 VIEW COMPARISON:  04/18/2018. FINDINGS: Borderline cardiac enlargement. LEFT pleural effusion appears increased. Slight retrocardiac density may be persistent. Streaky BILATERAL interstitial markings represent emphysematous change particularly at the lung apices. No definite consolidation. IMPRESSION: 1. Increasing LEFT pleural effusion. Electronically Signed   By: Staci Righter M.D.   On: 05/03/2018 15:31    Pending Labs Unresulted Labs (From admission, onward)    Start     Ordered   05/03/18 1510  Urine culture  ONCE - STAT,   STAT     05/03/18 1510          Vitals/Pain Today's Vitals   05/04/18 1000 05/04/18 1015 05/04/18 1030 05/04/18 1045  BP: 134/74 137/72 (!) 141/69 139/73  Pulse: 96 96 (!) 103 100  Resp: (!) 30 (!) 32 (!) 32 (!) 29  Temp:      TempSrc:      SpO2: 94% 96% 97% 94%  Weight:      PainSc:        Isolation Precautions No active isolations  Medications Medications  0.9 %  sodium chloride infusion (has no administration in time range)  ceFEPIme (MAXIPIME) 1 g in sodium chloride 0.9 % 100 mL  IVPB (has no administration in time range)  acetaminophen (TYLENOL) tablet 650 mg (has no administration in time range)    Or  acetaminophen (TYLENOL) suppository 650 mg (has no administration in time range)  ondansetron (ZOFRAN) tablet 4 mg (has no administration in time range)    Or  ondansetron (ZOFRAN) injection 4 mg (has no administration in time range)  insulin detemir (LEVEMIR) injection 5 Units (5 Units Subcutaneous Given 05/04/18 0900)  midodrine (PROAMATINE) tablet 10 mg (has no administration in time range)  simvastatin (ZOCOR) tablet 20 mg (20 mg Oral Given 05/04/18 0423)  baclofen (LIORESAL) tablet 10 mg (has no administration in time range)  insulin aspart  (novoLOG) injection 0-9 Units (0 Units Subcutaneous Not Given 05/04/18 0905)  ceFEPIme (MAXIPIME) 2 g in sodium chloride 0.9 % 100 mL IVPB (0 g Intravenous Stopped 05/03/18 1842)  vancomycin (VANCOCIN) IVPB 1000 mg/200 mL premix (0 mg Intravenous Stopped 05/03/18 2025)  vancomycin (VANCOCIN) 500 mg in sodium chloride 0.9 % 100 mL IVPB (0 mg Intravenous Stopped 05/04/18 0142)    Mobility non-ambulatory High fall risk   Focused Assessments Cardiac Assessment Handoff:  Cardiac Rhythm: Normal sinus rhythm Lab Results  Component Value Date   CKTOTAL 26 (L) 03/24/2016   TROPONINI 0.23 ([place]) 02/15/2016   No results found for: DDIMER Does the Patient currently have chest pain? No     Recommendations: See Admitting Provider Note  Report given to:   Additional Notes: no notes

## 2018-05-04 NOTE — Progress Notes (Addendum)
PHARMACY - PHYSICIAN COMMUNICATION CRITICAL VALUE ALERT - BLOOD CULTURE IDENTIFICATION (BCID) Lance [name] is an 78 y.o. male who presented to [facility] on 05/03/2018 with a chief complaint of fever  Assessment:   Received call from microbiology lab: 2/2 blood cultures growing gram positive cocci BCID panel: Staphyloccous species detected, methicillin resistance detected.  No source yet identified for infection.  Patient received vancomycin 1500mg  loading dose and cefepime 2gram x1 in the ED thus far.  Contacted MD and suggested discontinuing cefepime.  Name of physician (or Provider) Contacted: Dr. Florene Glen  Current antibiotics: Cefepime and vancomycin (iHD dosing once HD schedule established)  Changes to prescribed antibiotics recommended:  Discontinue cefepime; continue vancomycin. Recommendations accepted by provider  Results for orders placed or performed during the hospital encounter of 05/03/18  Blood Culture ID Panel (Reflexed) (Collected: 05/03/2018  3:50 PM)  Result Value Ref Range   Enterococcus species NOT DETECTED NOT DETECTED   Listeria monocytogenes NOT DETECTED NOT DETECTED   Staphylococcus species DETECTED (A) NOT DETECTED   Staphylococcus aureus (BCID) DETECTED (A) NOT DETECTED   Methicillin resistance DETECTED (A) NOT DETECTED   Streptococcus species NOT DETECTED NOT DETECTED   Streptococcus agalactiae NOT DETECTED NOT DETECTED   Streptococcus pneumoniae NOT DETECTED NOT DETECTED   Streptococcus pyogenes NOT DETECTED NOT DETECTED   Acinetobacter baumannii NOT DETECTED NOT DETECTED   Enterobacteriaceae species NOT DETECTED NOT DETECTED   Enterobacter cloacae complex NOT DETECTED NOT DETECTED   Escherichia coli NOT DETECTED NOT DETECTED   Klebsiella oxytoca NOT DETECTED NOT DETECTED   Klebsiella pneumoniae NOT DETECTED NOT DETECTED   Proteus species NOT DETECTED NOT DETECTED   Serratia marcescens NOT DETECTED NOT DETECTED   Haemophilus influenzae NOT  DETECTED NOT DETECTED   Neisseria meningitidis NOT DETECTED NOT DETECTED   Pseudomonas aeruginosa NOT DETECTED NOT DETECTED   Candida albicans NOT DETECTED NOT DETECTED   Candida glabrata NOT DETECTED NOT DETECTED   Candida krusei NOT DETECTED NOT DETECTED   Candida parapsilosis NOT DETECTED NOT DETECTED   Candida tropicalis NOT DETECTED NOT DETECTED   Thank you for allowing pharmacy to be a part of this patient's care.  Tamela Gammon, PharmD 05/04/2018 11:37 AM PGY-1 Pharmacy Resident Direct Phone: [phone] Please check [email or web address] for unit-specific pharmacist phone numbers

## 2018-05-04 NOTE — Consult Note (Addendum)
[facility] Renal Consultation Note    Indication for Consultation:  Management of ESRD/hemodialysis; anemia, hypertension/volume and secondary hyperparathyroidism PCP: Lavone Orn, MD  HPI: Lance [name] is a 78 y.o. male with ESRD secondary to DM on dialysis since 02/2016 at [facility].  PMHx also complicated by MS with paraplegia, neurogenic bladder, chronic foley with hx UTIs and several admission for sepsis, ischial pressure ulcer.   He is followed by home health. He has had multiple blood transfusions since early November. He is last outpatient Hgb was 6.9 at dialysis 12/30 down from 7.3 12/26 and 8.4 12/19 post transfusion.  He has been on max Mircera dose and receiving periodic IV Fe ferritin has been high at 1400.   He dialyzed without difficult Monday 12/30 (afebrile) and presented to the ED 12/31  with weakness and fever to 102 at home for which he treated with tylenol.  In the ED he was  found to have a Hgb of 5.8 WBC 10 87 % N.  CRP is elevated at 23.9 lactic acid 0.96 Post transfusion in the ED of 2 units PRBC Hgb is 8.5 K 4.3 BUN 41 Cr 2.97. He is influenza negative, urine suggestive of UTI. Initial BC is positive for MRSA.  Urine culture is pending.  CXR shows increasing left pleural effusion. He has been started on Vanc and cefepime.  Stool hemocults have been repeatedly negative.  New finding this am of pelvic mass with destruction of segment of sacrum and extension into the spinal canal; also deep sacral ulcer with prior resection of sacrum/ coccyx with no evidence of osteo.  He denies cough, SOB at rest. No N, V, D. Positive fatigue, poor appetite, chronic wounds.  He is tired of doing this, his poor quality of life and is interested in palliative care consult to further conversations about goals of care with his family.    Past Medical History:  Diagnosis Date  . Acute CHF (congestive heart failure) ([place]) 02/14/2016  . Acute osteomyelitis, pelvis, right  ([place])   . Acute renal failure ([place])   . Anemia in chronic renal disease   . Blood transfusion   . Cardiomyopathy ([place]) 02/17/2016  . Chronic spastic paraplegia    secondary to MS  . Decreased sensation of lower extremity    due to MS  . Decubitus ulcer of ischium   . Decubitus ulcer of trochanter, stage 4 ([place]) 06/14/2015  . ESRD (end stage renal disease) ([place])    [place]  . History of kidney stones    x2  . History of MRSA infection    urine  . History of recurrent UTIs   . Hypertension   . MS (multiple sclerosis) ([place])   . Multiple sclerosis ([place])    stated by client in triage   . Neuralgia neuritis, sciatic nerve    MS for 30 years  . Neurogenic bladder   . PONV (postoperative nausea and vomiting)   . Presence of indwelling urinary catheter   . Self-catheterizes urinary bladder   . Type 2 diabetes mellitus ([place])    Past Surgical History:  Procedure Laterality Date  . AMPUTATION Left 01/22/2017   Procedure: Left 5th Ray Amputation;  Surgeon: Newt Minion, MD;  Location: [place];  Service: Orthopedics;  Laterality: Left;  . APPLICATION OF A-CELL OF EXTREMITY Left 09/05/2014   Procedure: PLACEMENT OF ACELL AND VAC;  Surgeon: Theodoro Kos, DO;  Location: [place];  Service: Plastics;  Laterality:  Left;  . BASCILIC VEIN TRANSPOSITION Right 03/24/2016   Procedure: RADIAL- CEPHALIC FISTULA CREATION RIGHT ARM;  Surgeon: Conrad Sparland, MD;  Location: [place];  Service: Vascular;  Laterality: Right;  . Buttocks flap Left   . COLONOSCOPY    . EXTRACORPOREAL SHOCK WAVE LITHOTRIPSY  03/23/2011   x2  . FINGER SURGERY  2004  . hip flap Right   . INCISION AND DRAINAGE OF WOUND Left 09/05/2014   Procedure: IRRIGATION AND DEBRIDEMENT OF LEFT ISCHIUM WOUND WITH ;  Surgeon: Theodoro Kos, DO;  Location: [place];  Service: Plastics;  Laterality: Left;  . INSERTION OF DIALYSIS CATHETER N/A 02/27/2016   Procedure: INSERTION OF DIALYSIS CATHETER-RIGHT INTERNAL JUGULA PLACEMENT;   Surgeon: Conrad Sterling, MD;  Location: [place];  Service: Vascular;  Laterality: N/A;  . TONSILLECTOMY    . WOUND DEBRIDEMENT     [facility]/2/[facility]    Family History  Problem Relation Age of Onset  . Stroke Father   . Diabetes Father   . Diabetes Paternal Grandmother   . Stroke Paternal Grandfather   . Heart disease Neg Hx   . Cancer Neg Hx    Social History:  reports that he quit smoking about 35 years ago. His smoking use included cigars. He has never used smokeless tobacco. He reports that he does not drink alcohol or use drugs. No Known Allergies Prior to Admission medications   Medication Sig Start Date End Date Taking? Authorizing Provider  acetaminophen (TYLENOL) 325 MG tablet Take 2 tablets (650 mg total) by mouth every 6 (six) hours as needed for mild pain (or Fever >/= 101). 09/09/17  Yes Emokpae, Courage, MD  baclofen (LIORESAL) 10 MG tablet Take 1 tablet (10 mg total) by mouth at bedtime as needed for muscle spasms. Patient taking differently: Take 10 mg by mouth daily.  07/15/16  Yes Lauree Chandler, NP  carvedilol (COREG) 3.125 MG tablet Take 1 tablet (3.125 mg total) by mouth 2 (two) times daily with a meal. 07/15/16  Yes Eubanks, Carlos American, NP  insulin aspart (NOVOLOG FLEXPEN) 100 UNIT/ML FlexPen Inject 0-9 Units into the skin 3 (three) times daily with meals. Per sliding scale Patient taking differently: Inject 7 Units 3 (three) times daily with meals into the skin. Per sliding scale 07/20/16  Yes Lauree Chandler, NP  insulin detemir (LEVEMIR) 100 UNIT/ML injection Inject 0.05 mLs (5 Units total) into the skin daily with breakfast. 03/07/18  Yes Ghimire, Henreitta Leber, MD  lidocaine-prilocaine (EMLA) cream Apply 1 application topically as needed. Patient taking differently: Apply 1 application topically as needed (for pain).  07/24/16  Yes Conrad [place], MD  Melatonin 3 MG TABS Take 3 mg by mouth at bedtime.   Yes [provider]  midodrine (PROAMATINE) 10 MG tablet Take 10  mg by mouth as needed. 01/26/18  Yes [provider]  SANTYL ointment Apply 1 application topically as needed. 01/13/18  Yes [provider]  simvastatin (ZOCOR) 20 MG tablet Take 20 mg by mouth every evening.   Yes [provider]  Skin Protectants, Misc. (EUCERIN) cream Apply 1 application daily as needed topically for dry skin. For feet    Yes [provider]  trimethoprim (TRIMPEX) 100 MG tablet Take 100 mg by mouth daily. 12/22/17  Yes [provider]  Insulin Pen Needle (PEN NEEDLES) 30G X 8 MM MISC Inject 1 each into the skin 4 (four) times daily. . 07/20/16   Lauree Chandler, NP  Current Facility-Administered Medications  Medication Dose Route Frequency Provider Last Rate Last Dose  . 0.9 %  sodium chloride infusion  10 mL/hr Intravenous Once Carlisle Cater, PA-C      . acetaminophen (TYLENOL) tablet 650 mg  650 mg Oral Q6H PRN Etta Quill, DO       Or  . acetaminophen (TYLENOL) suppository 650 mg  650 mg Rectal Q6H PRN Etta Quill, DO      . baclofen (LIORESAL) tablet 10 mg  10 mg Oral QHS PRN Etta Quill, DO      . insulin aspart (novoLOG) injection 0-9 Units  0-9 Units Subcutaneous TID WC Jennette Kettle M, DO      . insulin detemir (LEVEMIR) injection 5 Units  5 Units Subcutaneous Q breakfast Etta Quill, DO   5 Units at 05/04/18 0900  . midodrine (PROAMATINE) tablet 10 mg  10 mg Oral PRN Etta Quill, DO      . ondansetron [facility]) tablet 4 mg  4 mg Oral Q6H PRN Etta Quill, DO       Or  . ondansetron [facility]) injection 4 mg  4 mg Intravenous Q6H PRN Etta Quill, DO      . simvastatin (ZOCOR) tablet 20 mg  20 mg Oral QPM Jennette Kettle M, DO   20 mg at 05/04/18 0423   Labs: Basic Metabolic Panel: Recent Labs  Lab 05/03/18 1536 05/04/18 0430  NA 136 136  K 3.7 4.3  CL 97* 98  CO2 27 24  GLUCOSE 114* 203*  BUN 29* 41*  CREATININE 2.45* 2.97*  CALCIUM 7.9* 8.3*   Liver Function Tests: Recent  Labs  Lab 05/03/18 1536  AST 17  ALT 10  ALKPHOS 62  BILITOT 0.6  PROT 6.5  ALBUMIN 1.5*   No results for input(s): LIPASE, AMYLASE in the last 168 hours. No results for input(s): AMMONIA in the last 168 hours. CBC: Recent Labs  Lab 05/03/18 1536 05/04/18 0430  WBC 10.3 11.6*  NEUTROABS 8.9*  --   HGB 5.8* 8.5*  HCT 19.7* 27.5*  MCV 101.0* 100.0  PLT 146* 164   Cardiac Enzymes: No results for input(s): CKTOTAL, CKMB, CKMBINDEX, TROPONINI in the last 168 hours. CBG: Recent Labs  Lab 05/03/18 2221  GLUCAP 189*   Iron Studies: No results for input(s): IRON, TIBC, TRANSFERRIN, FERRITIN in the last 72 hours. Studies/Results: Mr Pelvis Wo Contrast  Result Date: 05/04/2018 CLINICAL DATA:  Pressure ulcer. EXAM: MRI PELVIS WITHOUT CONTRAST TECHNIQUE: Multiplanar multisequence MR imaging of the pelvis was performed. No intravenous contrast was administered. COMPARISON:  MRI dated 02/14/2016 FINDINGS: Musculoskeletal: There appears to be a mass destroying the S2 segment of the sacrum and extending into the sacral spinal canal. This was not present on the prior study. Does the patient have a history of malignancy? If not, plasmocytoma should be considered. The sacrum appears to have been resected below the third sacral segment. Prominent sacral decubitus ulcer is just below the third sacral segment. No discrete osteomyelitis. There is new diffuse mottling of the bone marrow of the pelvic bones remnants of the sacrum. Urinary Tract:  Foley catheter in place.  Thickened bladder wall. Bowel:  Unremarkable visualized pelvic bowel loops. Vascular/Lymphatic: No pathologically enlarged lymph nodes. No significant vascular abnormality seen. Reproductive:  No mass or other significant abnormality Other: There is a diffuse edema within the muscles of the pelvis and buttocks. IMPRESSION: 1. New mass destroying the second sacral segment and extending  into the sacral spinal canal. This is consistent with  tumor. Does the patient have any known malignancy? If not, the possibility of plasmacytoma should be considered. 2. Deep sacral decubitus ulcer with previous resection of the distal sacrum and coccyx. No findings suggestive of current osteomyelitis. Electronically Signed   By: Lorriane Shire M.D.   On: 05/04/2018 10:37   Dg Chest Port 1 View  Result Date: 05/03/2018 CLINICAL DATA:  Weakness with fever. EXAM: PORTABLE CHEST 1 VIEW COMPARISON:  04/18/2018. FINDINGS: Borderline cardiac enlargement. LEFT pleural effusion appears increased. Slight retrocardiac density may be persistent. Streaky BILATERAL interstitial markings represent emphysematous change particularly at the lung apices. No definite consolidation. IMPRESSION: 1. Increasing LEFT pleural effusion. Electronically Signed   By: Staci Righter M.D.   On: 05/03/2018 15:31    ROS: As per HPI otherwise negative.  Physical Exam: Vitals:   05/04/18 1100 05/04/18 1115 05/04/18 1130 05/04/18 1145  BP: (!) 141/68 137/68 136/67 129/64  Pulse: 97 100 100 94  Resp: (!) 27 (!) 34 (!) 30 (!) 32  Temp:      TempSrc:      SpO2: 96% 96% 97% 96%  Weight:         General: [name] chronically ill with sats ok on room air Head: NCAT sclera not icteric MMM Neck: Supple.  Lungs: CTA anteriorly dim left base  Heart: RRR with S1 S2.  Abdomen: soft NT + BS  MS- diffuse wasting GU - foley ~200 in bag Lower extremities: tr LE edema feet in booties - wounds not examined but has odor of pseudomonas Neuro: A & O  X 3, articulate of needs Psych:  Responds to questions appropriately with a usual affect. Dialysis Access: right forearm AVF + bruit  Dialysis Orders:  TTS NW 3.5 hours 400/800 2 k 2Ca right forearm AVF profile 2 no heparin Hectorol 1 mircera 225 last 12/26 Ave net UF 1- 1.8 L post wt Monday  Recent labs: hgb 6.9 12/30 12% sat early Dec s/p short course ferritin ~1400 iPTH 171 Ca 8.3 P 2.3 alb 2.5   Assessment/Plan: 1. Fever - MRSA  positive on blood culture -? Source - wounds/urine - on Vanc and Maxepime. 2. ESRD -  TTS - last HD Monday on holiday schedule - labs ok today - no acute need for dialysis - defer until Thursday am first round 71.7  3. BP/volume  - requires midodrine for BP support - needs lowering of EDW 4. Anemia  - hgb 5.8 upon admission - transfused 2 units PRBC up to 8.5 this am post transfusion - -recurrent frequent transfusions - may be related to newly discovered pelvic mass. Transfuse prn - hold on resuming ESA for now 5. Metabolic bone disease -  Continue hectorol- resume powdered renvela pending P levels 6. Nutrition -added supplements/vitamins  7. Sacral decubitus - chronic/foot wounds - wound care RN to eval -   Wounds not examined 8.   MS with paraplegia/neurogenic bladder - chronic foley UTIs 9.   DM - Levimir + SSI 10. New pelvic mass with destruction of seg of sacrum and extending into spinal canal - (have not discussed with him yet) 11. Goals of care - have initiated palliative care consult per pt request - new findings on pelvic MRI may help direct decisions  Myriam Jacobson, PA-C [place] [phone] 05/04/2018, 12:09 PM   I have seen and examined this patient and agree with the plan of care. No acute  indication for HD; next HD Thur. Will need transfusion this admission. New pelvic mass on MRI concerning; palliative care consult certainly appropriate.  Dwana Melena, MD 05/04/2018, 12:59 PM

## 2018-05-04 NOTE — Consult Note (Signed)
[facility]    Date of Admission:  05/03/2018   Total days of antibiotics: 0 vanco               Reason for Consult: Staph bacteremia    Referring Provider: CHAMP   Assessment: MRSA bacteremia Sacral Mass ESRD Anemia (5.8/19.7) Palliative Care eval  Plan: 1. Will re-order vanco 2. Repeat BCx ordered today 3. WOC eval 4. Airflow bed please 5. Nutrition eval 6. Palliative care to eval 7. IR to eval regarding bx of his spinal mass.  8. Check TTE (given his overall co-morbidities, would not check TEE).    Thank you so much for this interesting consult,  Principal Problem:   Fever Active Problems:   Multiple sclerosis ([place])   Sacral decubitus ulcer   Progressive anemia   Spastic quadriplegia (HCC)   DM (diabetes mellitus) type II controlled with renal manifestation (HCC)   ESRD on hemodialysis (HCC)   Symptomatic anemia   . [START ON 05/05/2018] Chlorhexidine Gluconate Cloth  6 each Topical Q0600  . [START ON 05/05/2018] doxercalciferol  1 mcg Intravenous Q T,Th,Sa-HD  . feeding supplement (NEPRO CARB STEADY)  237 mL Oral BID BM  . feeding supplement (PRO-STAT SUGAR FREE 64)  30 mL Oral BID  . insulin aspart  0-9 Units Subcutaneous TID WC  . insulin detemir  5 Units Subcutaneous Q breakfast  . multivitamin  1 tablet Oral QHS  . simvastatin  20 mg Oral QPM    HPI: Lance [name] is a 78 y.o. male with hx of ESRD on HD, paraplegia due to MS, decubitus and sacral ulcers. He also has a hx of chronic anemia, requiring multiple transfusions.  He has a hx of chronic foley and UTIs as well.  He was brought to ED on 12-31 with fever 102, cough and weakness. In ED he was afebrile, his WBC was 11.  He had MRI of pelvis to eval his sacrum which was seen to show: 1. New mass destroying the second sacral segment and extending into the sacral spinal canal. This is consistent with tumor. Does the patient have any known malignancy? If not,  the possibility of plasmacytoma should be considered. 2. Deep sacral decubitus ulcer with previous resection of the distal sacrum and coccyx. No findings suggestive of current osteomyelitis.  He was started on cefepime and vanco.    Review of Systems: Review of Systems  Constitutional: Positive for fever.  Respiratory: Positive for cough. Negative for shortness of breath.   Gastrointestinal: Positive for abdominal pain. Negative for constipation and diarrhea.  Genitourinary: Negative for dysuria.  c/o dysphagia Please see HPI. All other systems reviewed and negative.   Past Medical History:  Diagnosis Date  . Acute CHF (congestive heart failure) ([place]) 02/14/2016  . Acute osteomyelitis, pelvis, right ([place])   . Acute renal failure ([place])   . Anemia in chronic renal disease   . Blood transfusion   . Cardiomyopathy ([place]) 02/17/2016  . Chronic spastic paraplegia    secondary to MS  . Decreased sensation of lower extremity    due to MS  . Decubitus ulcer of ischium   . Decubitus ulcer of trochanter, stage 4 ([place]) 06/14/2015  . ESRD (end stage renal disease) ([place])    [place]  . History of kidney stones    x2  . History of MRSA infection    urine  . History of recurrent UTIs   .  Hypertension   . MS (multiple sclerosis) ([place])   . Multiple sclerosis ([place])    stated by client in triage   . Neuralgia neuritis, sciatic nerve    MS for 30 years  . Neurogenic bladder   . PONV (postoperative nausea and vomiting)   . Presence of indwelling urinary catheter   . Self-catheterizes urinary bladder   . Type 2 diabetes mellitus (HCC)     Social History   Tobacco Use  . Smoking status: Former Smoker    Types: Cigars    Last attempt to quit: 05/06/1983    Years since quitting: 35.0  . Smokeless tobacco: Never Used  Substance Use Topics  . Alcohol use: No    Alcohol/week: 1.0 standard drinks    Types: 1 Glasses of wine per week    Comment: pt has not had any  since Dec 2017  . Drug use: No    Family History  Problem Relation Age of Onset  . Stroke Father   . Diabetes Father   . Diabetes Paternal Grandmother   . Stroke Paternal Grandfather   . Heart disease Neg Hx   . Cancer Neg Hx      Medications:  Scheduled: . [START ON 05/05/2018] Chlorhexidine Gluconate Cloth  6 each Topical Q0600  . [START ON 05/05/2018] doxercalciferol  1 mcg Intravenous Q T,Th,Sa-HD  . feeding supplement (NEPRO CARB STEADY)  237 mL Oral BID BM  . feeding supplement (PRO-STAT SUGAR FREE 64)  30 mL Oral BID  . insulin aspart  0-9 Units Subcutaneous TID WC  . insulin detemir  5 Units Subcutaneous Q breakfast  . multivitamin  1 tablet Oral QHS  . simvastatin  20 mg Oral QPM    Abtx:  Anti-infectives (From admission, onward)   Start     Dose/Rate Route Frequency Ordered Stop   05/04/18 2000  ceFEPIme (MAXIPIME) 1 g in sodium chloride 0.9 % 100 mL IVPB  Status:  Discontinued     1 g 200 mL/hr over 30 Minutes Intravenous Every 24 hours 05/03/18 1754 05/04/18 1134   05/03/18 1800  vancomycin (VANCOCIN) 500 mg in sodium chloride 0.9 % 100 mL IVPB     500 mg 100 mL/hr over 60 Minutes Intravenous  Once 05/03/18 1754 05/04/18 0142   05/03/18 1745  ceFEPIme (MAXIPIME) 2 g in sodium chloride 0.9 % 100 mL IVPB     2 g 200 mL/hr over 30 Minutes Intravenous  Once 05/03/18 1739 05/03/18 1842   05/03/18 1745  vancomycin (VANCOCIN) IVPB 1000 mg/200 mL premix     1,000 mg 200 mL/hr over 60 Minutes Intravenous  Once 05/03/18 1739 05/03/18 2025     OBJECTIVE: Blood pressure 129/64, pulse 94, temperature (!) 97.4 F (36.3 C), temperature source Oral, resp. rate (!) 32, weight 72.6 kg, SpO2 96 %.  Physical Exam Constitutional:      Appearance: He is ill-appearing. He is not toxic-appearing.  HENT:     Mouth/Throat:     Pharynx: No oropharyngeal exudate.  Neck:     Musculoskeletal: Neck supple.  Cardiovascular:     Rate and Rhythm: Normal rate and regular rhythm.    Pulmonary:     Effort: Pulmonary effort is normal.     Breath sounds: Normal breath sounds.  Abdominal:     General: Bowel sounds are normal.     Palpations: Abdomen is soft.     Tenderness: There is no abdominal tenderness.  Skin:      Neurological:  Mental Status: He is alert.     Lab Results Results for orders placed or performed during the hospital encounter of 05/03/18 (from the past 48 hour(s))  Comprehensive metabolic panel     Status: Abnormal   Collection Time: 05/03/18  3:36 PM  Result Value Ref Range   Sodium 136 135 - 145 mmol/L   Potassium 3.7 3.5 - 5.1 mmol/L   Chloride 97 (L) 98 - 111 mmol/L   CO2 27 22 - 32 mmol/L   Glucose, Bld 114 (H) 70 - 99 mg/dL   BUN 29 (H) 8 - 23 mg/dL   Creatinine, Ser 2.45 (H) 0.61 - 1.24 mg/dL   Calcium 7.9 (L) 8.9 - 10.3 mg/dL   Total Protein 6.5 6.5 - 8.1 g/dL   Albumin 1.5 (L) 3.5 - 5.0 g/dL   AST 17 15 - 41 U/L   ALT 10 0 - 44 U/L   Alkaline Phosphatase 62 38 - 126 U/L   Total Bilirubin 0.6 0.3 - 1.2 mg/dL   GFR calc non Af Amer 24 (L) >60 mL/min   GFR calc Af Amer 28 (L) >60 mL/min   Anion gap 12 5 - 15    Comment: Performed at [facility], [address] [place], [place] [address]  CBC WITH DIFFERENTIAL     Status: Abnormal   Collection Time: 05/03/18  3:36 PM  Result Value Ref Range   WBC 10.3 4.0 - 10.5 K/uL   RBC 1.95 (L) 4.22 - 5.81 MIL/uL   Hemoglobin 5.8 (LL) 13.0 - 17.0 g/dL    Comment: REPEATED TO VERIFY THIS CRITICAL RESULT HAS VERIFIED AND BEEN CALLED TO PATTI PEREZ,RN BY ZELDA BEECH ON 12 31 2019 AT 1630, AND HAS BEEN READ BACK.  THIS CRITICAL RESULT HAS VERIFIED AND BEEN CALLED TO PATTI PEREZ,RN BY ZELDA BEECH ON 12 31 2019 AT 1635, AND HAS BEEN READ BACK.     HCT 19.7 (L) 39.0 - 52.0 %   MCV 101.0 (H) 80.0 - 100.0 fL   MCH 29.7 26.0 - 34.0 pg   MCHC 29.4 (L) 30.0 - 36.0 g/dL   RDW 18.1 (H) 11.5 - 15.5 %   Platelets 146 (L) 150 - 400 K/uL   nRBC 0.0 0.0 - 0.2 %   Neutrophils Relative % 87  %   Neutro Abs 8.9 (H) 1.7 - 7.7 K/uL   Lymphocytes Relative 6 %   Lymphs Abs 0.7 0.7 - 4.0 K/uL   Monocytes Relative 6 %   Monocytes Absolute 0.6 0.1 - 1.0 K/uL   Eosinophils Relative 0 %   Eosinophils Absolute 0.0 0.0 - 0.5 K/uL   Basophils Relative 0 %   Basophils Absolute 0.0 0.0 - 0.1 K/uL   Smear Review MORPHOLOGY UNREMARKABLE    Immature Granulocytes 1 %   Abs Immature Granulocytes 0.08 (H) 0.00 - 0.07 K/uL    Comment: Performed at [facility], [address], [place],  [address]  Type and screen [place]     Status: None (Preliminary result)   Collection Time: 05/03/18  3:36 PM  Result Value Ref Range   ABO/RH(D) A POS    Antibody Screen NEG    Sample Expiration 05/06/2018    Unit Number [id number]    Blood Component Type RED CELLS,LR    Unit division 00    Status of Unit ISSUED    Transfusion Status OK TO TRANSFUSE    Crossmatch Result Compatible    Unit  Number [id number]    Blood Component Type RED CELLS,LR    Unit division 00    Status of Unit ISSUED    Transfusion Status OK TO TRANSFUSE    Crossmatch Result      Compatible Performed at [facility], [place] [address], [place], [place] [address]   Blood Culture (routine x 2)     Status: None (Preliminary result)   Collection Time: 05/03/18  3:37 PM  Result Value Ref Range   Specimen Description BLOOD LEFT HAND    Special Requests      BOTTLES DRAWN AEROBIC AND ANAEROBIC Blood Culture adequate volume   Culture  Setup Time      GRAM POSITIVE COCCI IN CLUSTERS IN BOTH AEROBIC AND ANAEROBIC BOTTLES CRITICAL VALUE NOTED.  VALUE IS CONSISTENT WITH PREVIOUSLY REPORTED AND CALLED VALUE. Performed at [facility], [place] [address], [place], [place] [address]    Culture GRAM POSITIVE COCCI    Report Status PENDING   Blood Culture (routine x 2)     Status: None (Preliminary result)   Collection Time: 05/03/18  3:50 PM  Result Value Ref Range   Specimen Description  BLOOD LEFT FOREARM    Special Requests      BOTTLES DRAWN AEROBIC AND ANAEROBIC Blood Culture results may not be optimal due to an excessive volume of blood received in culture bottles   Culture  Setup Time      GRAM POSITIVE COCCI IN BOTH AEROBIC AND ANAEROBIC BOTTLES Organism ID to follow CRITICAL RESULT CALLED TO, READ BACK BY AND VERIFIED WITH: Drema Dallas PHARMD, AT 1004 05/04/18 BY D. VANHOOK Performed at [facility], [place] [address] [place],  [address]    Culture GRAM POSITIVE COCCI    Report Status PENDING   Blood Culture ID Panel (Reflexed)     Status: Abnormal   Collection Time: 05/03/18  3:50 PM  Result Value Ref Range   Enterococcus species NOT DETECTED NOT DETECTED   Listeria monocytogenes NOT DETECTED NOT DETECTED   Staphylococcus species DETECTED (A) NOT DETECTED    Comment: CRITICAL RESULT CALLED TO, READ BACK BY AND VERIFIED WITH: J. TESSIN PHARMD, AT 1004 05/04/18 BY D. VANHOOK    Staphylococcus aureus (BCID) DETECTED (A) NOT DETECTED    Comment: Methicillin (oxacillin)-resistant Staphylococcus aureus (MRSA). MRSA is predictably resistant to beta-lactam antibiotics (except ceftaroline). Preferred therapy is vancomycin unless clinically contraindicated. Patient requires contact precautions if  hospitalized. CRITICAL RESULT CALLED TO, READ BACK BY AND VERIFIED WITH: J. TESSIN PHARMD, AT 1004 05/04/18 BY D. VANHOOK    Methicillin resistance DETECTED (A) NOT DETECTED    Comment: CRITICAL RESULT CALLED TO, READ BACK BY AND VERIFIED WITH: J. TESSIN PHARMD, AT 1004 05/04/18 BY D. VANHOOK    Streptococcus species NOT DETECTED NOT DETECTED   Streptococcus agalactiae NOT DETECTED NOT DETECTED   Streptococcus pneumoniae NOT DETECTED NOT DETECTED   Streptococcus pyogenes NOT DETECTED NOT DETECTED   Acinetobacter baumannii NOT DETECTED NOT DETECTED   Enterobacteriaceae species NOT DETECTED NOT DETECTED   Enterobacter cloacae complex NOT DETECTED NOT DETECTED    Escherichia coli NOT DETECTED NOT DETECTED   Klebsiella oxytoca NOT DETECTED NOT DETECTED   Klebsiella pneumoniae NOT DETECTED NOT DETECTED   Proteus species NOT DETECTED NOT DETECTED   Serratia marcescens NOT DETECTED NOT DETECTED   Haemophilus influenzae NOT DETECTED NOT DETECTED   Neisseria meningitidis NOT DETECTED NOT DETECTED   Pseudomonas aeruginosa NOT DETECTED NOT DETECTED   Candida  albicans NOT DETECTED NOT DETECTED   Candida glabrata NOT DETECTED NOT DETECTED   Candida krusei NOT DETECTED NOT DETECTED   Candida parapsilosis NOT DETECTED NOT DETECTED   Candida tropicalis NOT DETECTED NOT DETECTED    Comment: Performed at [facility], [place] [address], [place], [place] [address]  I-Stat CG4 Lactic Acid, ED     Status: None   Collection Time: 05/03/18  3:52 PM  Result Value Ref Range   Lactic Acid, Venous 1.52 0.5 - 1.9 mmol/L  POC occult blood, ED Provider will collect     Status: None   Collection Time: 05/03/18  5:06 PM  Result Value Ref Range   Fecal Occult Bld NEGATIVE NEGATIVE  Prepare RBC     Status: None   Collection Time: 05/03/18  6:00 PM  Result Value Ref Range   Order Confirmation      ORDER PROCESSED BY BLOOD BANK Performed at [facility], [place] [address] [place], [place] [address]   Influenza panel by PCR (type A & B)     Status: None   Collection Time: 05/03/18  6:15 PM  Result Value Ref Range   Influenza A By PCR NEGATIVE NEGATIVE   Influenza B By PCR NEGATIVE NEGATIVE    Comment: (NOTE) The Xpert Xpress Flu assay is intended as an aid in the diagnosis of  influenza and should not be used as a sole basis for treatment.  This  assay is FDA approved for nasopharyngeal swab specimens only. Nasal  washings and aspirates are unacceptable for Xpert Xpress Flu testing. Performed at [facility], [place] [address], [place], [place] [address]   I-Stat CG4 Lactic Acid, ED     Status: None   Collection Time: 05/03/18  6:28 PM  Result Value Ref  Range   Lactic Acid, Venous 0.96 0.5 - 1.9 mmol/L  Urinalysis, Routine w reflex microscopic     Status: Abnormal   Collection Time: 05/03/18  7:35 PM  Result Value Ref Range   Color, Urine AMBER (A) YELLOW    Comment: BIOCHEMICALS MAY BE AFFECTED BY COLOR   APPearance CLOUDY (A) CLEAR   Specific Gravity, Urine 1.015 1.005 - 1.030   pH 7.0 5.0 - 8.0   Glucose, UA NEGATIVE NEGATIVE mg/dL   Hgb urine dipstick LARGE (A) NEGATIVE   Bilirubin Urine NEGATIVE NEGATIVE   Ketones, ur 5 (A) NEGATIVE mg/dL   Protein, ur 100 (A) NEGATIVE mg/dL   Nitrite NEGATIVE NEGATIVE   Leukocytes, UA MODERATE (A) NEGATIVE   RBC / HPF >50 (H) 0 - 5 RBC/hpf   WBC, UA >50 (H) 0 - 5 WBC/hpf   Bacteria, UA MANY (A) NONE SEEN   Squamous Epithelial / LPF 0-5 0 - 5   WBC Clumps PRESENT     Comment: Performed at [facility], [address], [place], [place] [address]  CBG monitoring, ED     Status: Abnormal   Collection Time: 05/03/18 10:21 PM  Result Value Ref Range   Glucose-Capillary 189 (H) 70 - 99 mg/dL  Sedimentation rate     Status: Abnormal   Collection Time: 05/04/18  4:30 AM  Result Value Ref Range   Sed Rate 127 (H) 0 - 16 mm/hr    Comment: Performed at [facility], [place] [address], [place], [place] [address]  C-reactive protein     Status: Abnormal   Collection Time: 05/04/18  4:30 AM  Result Value Ref Range  CRP 23.9 (H) <1.0 mg/dL    Comment: Performed at [place] [address] [place], [place] [address]  CBC     Status: Abnormal   Collection Time: 05/04/18  4:30 AM  Result Value Ref Range   WBC 11.6 (H) 4.0 - 10.5 K/uL   RBC 2.75 (L) 4.22 - 5.81 MIL/uL   Hemoglobin 8.5 (L) 13.0 - 17.0 g/dL    Comment: REPEATED TO VERIFY POST TRANSFUSION SPECIMEN    HCT 27.5 (L) 39.0 - 52.0 %   MCV 100.0 80.0 - 100.0 fL   MCH 30.9 26.0 - 34.0 pg   MCHC 30.9 30.0 - 36.0 g/dL   RDW 17.8 (H) 11.5 - 15.5 %   Platelets 164 150 - 400 K/uL   nRBC 0.0 0.0 - 0.2 %    Comment:  Performed at [facility], [place] [address] [place], [place] [address]  Basic metabolic panel     Status: Abnormal   Collection Time: 05/04/18  4:30 AM  Result Value Ref Range   Sodium 136 135 - 145 mmol/L   Potassium 4.3 3.5 - 5.1 mmol/L   Chloride 98 98 - 111 mmol/L   CO2 24 22 - 32 mmol/L   Glucose, Bld 203 (H) 70 - 99 mg/dL   BUN 41 (H) 8 - 23 mg/dL   Creatinine, Ser 2.97 (H) 0.61 - 1.24 mg/dL   Calcium 8.3 (L) 8.9 - 10.3 mg/dL   GFR calc non Af Amer 19 (L) >60 mL/min   GFR calc Af Amer 22 (L) >60 mL/min   Anion gap 14 5 - 15    Comment: Performed at [facility] [address] [place], [place] [address]      Component Value Date/Time   SDES BLOOD LEFT FOREARM 05/03/2018 1550   SPECREQUEST  05/03/2018 1550    BOTTLES DRAWN AEROBIC AND ANAEROBIC Blood Culture results may not be optimal due to an excessive volume of blood received in culture bottles   CULT GRAM POSITIVE COCCI 05/03/2018 1550   REPTSTATUS PENDING 05/03/2018 1550   Mr Pelvis Wo Contrast  Result Date: 05/04/2018 CLINICAL DATA:  Pressure ulcer. EXAM: MRI PELVIS WITHOUT CONTRAST TECHNIQUE: Multiplanar multisequence MR imaging of the pelvis was performed. No intravenous contrast was administered. COMPARISON:  MRI dated 02/14/2016 FINDINGS: Musculoskeletal: There appears to be a mass destroying the S2 segment of the sacrum and extending into the sacral spinal canal. This was not present on the prior study. Does the patient have a history of malignancy? If not, plasmocytoma should be considered. The sacrum appears to have been resected below the third sacral segment. Prominent sacral decubitus ulcer is just below the third sacral segment. No discrete osteomyelitis. There is new diffuse mottling of the bone marrow of the pelvic bones remnants of the sacrum. Urinary Tract:  Foley catheter in place.  Thickened bladder wall. Bowel:  Unremarkable visualized pelvic bowel loops. Vascular/Lymphatic: No pathologically  enlarged lymph nodes. No significant vascular abnormality seen. Reproductive:  No mass or other significant abnormality Other: There is a diffuse edema within the muscles of the pelvis and buttocks. IMPRESSION: 1. New mass destroying the second sacral segment and extending into the sacral spinal canal. This is consistent with tumor. Does the patient have any known malignancy? If not, the possibility of plasmacytoma should be considered. 2. Deep sacral decubitus ulcer with previous resection of the distal sacrum and coccyx. No findings suggestive of current osteomyelitis. Electronically Signed   By: Jeneen Rinks  Maxwell M.D.   On: 05/04/2018 10:37   Dg Chest Port 1 View  Result Date: 05/03/2018 CLINICAL DATA:  Weakness with fever. EXAM: PORTABLE CHEST 1 VIEW COMPARISON:  04/18/2018. FINDINGS: Borderline cardiac enlargement. LEFT pleural effusion appears increased. Slight retrocardiac density may be persistent. Streaky BILATERAL interstitial markings represent emphysematous change particularly at the lung apices. No definite consolidation. IMPRESSION: 1. Increasing LEFT pleural effusion. Electronically Signed   By: Staci Righter M.D.   On: 05/03/2018 15:31   Recent Results (from the past 240 hour(s))  Blood Culture (routine x 2)     Status: None (Preliminary result)   Collection Time: 05/03/18  3:37 PM  Result Value Ref Range Status   Specimen Description BLOOD LEFT HAND  Final   Special Requests   Final    BOTTLES DRAWN AEROBIC AND ANAEROBIC Blood Culture adequate volume   Culture  Setup Time   Final    GRAM POSITIVE COCCI IN CLUSTERS IN BOTH AEROBIC AND ANAEROBIC BOTTLES CRITICAL VALUE NOTED.  VALUE IS CONSISTENT WITH PREVIOUSLY REPORTED AND CALLED VALUE. Performed at [facility], [place] [address] [place], [place] [address]    Culture GRAM POSITIVE COCCI  Final   Report Status PENDING  Incomplete  Blood Culture (routine x 2)     Status: None (Preliminary result)   Collection Time: 05/03/18   3:50 PM  Result Value Ref Range Status   Specimen Description BLOOD LEFT FOREARM  Final   Special Requests   Final    BOTTLES DRAWN AEROBIC AND ANAEROBIC Blood Culture results may not be optimal due to an excessive volume of blood received in culture bottles   Culture  Setup Time   Final    GRAM POSITIVE COCCI IN BOTH AEROBIC AND ANAEROBIC BOTTLES Organism ID to follow CRITICAL RESULT CALLED TO, READ BACK BY AND VERIFIED WITH: Drema Dallas PHARMD, AT 1004 05/04/18 BY Rush Landmark Performed at [facility], [place] [address], [place], [place] [address]    Culture GRAM POSITIVE COCCI  Final   Report Status PENDING  Incomplete  Blood Culture ID Panel (Reflexed)     Status: Abnormal   Collection Time: 05/03/18  3:50 PM  Result Value Ref Range Status   Enterococcus species NOT DETECTED NOT DETECTED Final   Listeria monocytogenes NOT DETECTED NOT DETECTED Final   Staphylococcus species DETECTED (A) NOT DETECTED Final    Comment: CRITICAL RESULT CALLED TO, READ BACK BY AND VERIFIED WITH: J. TESSIN PHARMD, AT 1004 05/04/18 BY D. VANHOOK    Staphylococcus aureus (BCID) DETECTED (A) NOT DETECTED Final    Comment: Methicillin (oxacillin)-resistant Staphylococcus aureus (MRSA). MRSA is predictably resistant to beta-lactam antibiotics (except ceftaroline). Preferred therapy is vancomycin unless clinically contraindicated. Patient requires contact precautions if  hospitalized. CRITICAL RESULT CALLED TO, READ BACK BY AND VERIFIED WITH: J. TESSIN PHARMD, AT 1004 05/04/18 BY D. VANHOOK    Methicillin resistance DETECTED (A) NOT DETECTED Final    Comment: CRITICAL RESULT CALLED TO, READ BACK BY AND VERIFIED WITH: J. TESSIN PHARMD, AT 1004 05/04/18 BY D. VANHOOK    Streptococcus species NOT DETECTED NOT DETECTED Final   Streptococcus agalactiae NOT DETECTED NOT DETECTED Final   Streptococcus pneumoniae NOT DETECTED NOT DETECTED Final   Streptococcus pyogenes NOT DETECTED NOT DETECTED Final   Acinetobacter  baumannii NOT DETECTED NOT DETECTED Final   Enterobacteriaceae species NOT DETECTED NOT DETECTED Final   Enterobacter cloacae complex NOT DETECTED NOT DETECTED Final   Escherichia coli NOT DETECTED NOT  DETECTED Final   Klebsiella oxytoca NOT DETECTED NOT DETECTED Final   Klebsiella pneumoniae NOT DETECTED NOT DETECTED Final   Proteus species NOT DETECTED NOT DETECTED Final   Serratia marcescens NOT DETECTED NOT DETECTED Final   Haemophilus influenzae NOT DETECTED NOT DETECTED Final   Neisseria meningitidis NOT DETECTED NOT DETECTED Final   Pseudomonas aeruginosa NOT DETECTED NOT DETECTED Final   Candida albicans NOT DETECTED NOT DETECTED Final   Candida glabrata NOT DETECTED NOT DETECTED Final   Candida krusei NOT DETECTED NOT DETECTED Final   Candida parapsilosis NOT DETECTED NOT DETECTED Final   Candida tropicalis NOT DETECTED NOT DETECTED Final    Comment: Performed at [facility], [place] [address], [place], [place] [address]    Microbiology: Recent Results (from the past 240 hour(s))  Blood Culture (routine x 2)     Status: None (Preliminary result)   Collection Time: 05/03/18  3:37 PM  Result Value Ref Range Status   Specimen Description BLOOD LEFT HAND  Final   Special Requests   Final    BOTTLES DRAWN AEROBIC AND ANAEROBIC Blood Culture adequate volume   Culture  Setup Time   Final    GRAM POSITIVE COCCI IN CLUSTERS IN BOTH AEROBIC AND ANAEROBIC BOTTLES CRITICAL VALUE NOTED.  VALUE IS CONSISTENT WITH PREVIOUSLY REPORTED AND CALLED VALUE. Performed at [facility], [place] [address] [place], [place] [address]    Culture GRAM POSITIVE COCCI  Final   Report Status PENDING  Incomplete  Blood Culture (routine x 2)     Status: None (Preliminary result)   Collection Time: 05/03/18  3:50 PM  Result Value Ref Range Status   Specimen Description BLOOD LEFT FOREARM  Final   Special Requests   Final    BOTTLES DRAWN AEROBIC AND ANAEROBIC Blood Culture results may not be  optimal due to an excessive volume of blood received in culture bottles   Culture  Setup Time   Final    GRAM POSITIVE COCCI IN BOTH AEROBIC AND ANAEROBIC BOTTLES Organism ID to follow CRITICAL RESULT CALLED TO, READ BACK BY AND VERIFIED WITH: Drema Dallas PHARMD, AT 1004 05/04/18 BY Rush Landmark Performed at [facility], [place] [address] [place], [place] [address]    Culture GRAM POSITIVE COCCI  Final   Report Status PENDING  Incomplete  Blood Culture ID Panel (Reflexed)     Status: Abnormal   Collection Time: 05/03/18  3:50 PM  Result Value Ref Range Status   Enterococcus species NOT DETECTED NOT DETECTED Final   Listeria monocytogenes NOT DETECTED NOT DETECTED Final   Staphylococcus species DETECTED (A) NOT DETECTED Final    Comment: CRITICAL RESULT CALLED TO, READ BACK BY AND VERIFIED WITH: J. TESSIN PHARMD, AT 1004 05/04/18 BY D. VANHOOK    Staphylococcus aureus (BCID) DETECTED (A) NOT DETECTED Final    Comment: Methicillin (oxacillin)-resistant Staphylococcus aureus (MRSA). MRSA is predictably resistant to beta-lactam antibiotics (except ceftaroline). Preferred therapy is vancomycin unless clinically contraindicated. Patient requires contact precautions if  hospitalized. CRITICAL RESULT CALLED TO, READ BACK BY AND VERIFIED WITH: J. TESSIN PHARMD, AT 1004 05/04/18 BY D. VANHOOK    Methicillin resistance DETECTED (A) NOT DETECTED Final    Comment: CRITICAL RESULT CALLED TO, READ BACK BY AND VERIFIED WITH: J. TESSIN PHARMD, AT 1004 05/04/18 BY D. VANHOOK    Streptococcus species NOT DETECTED NOT DETECTED Final   Streptococcus agalactiae NOT DETECTED NOT DETECTED Final   Streptococcus pneumoniae NOT DETECTED NOT  DETECTED Final   Streptococcus pyogenes NOT DETECTED NOT DETECTED Final   Acinetobacter baumannii NOT DETECTED NOT DETECTED Final   Enterobacteriaceae species NOT DETECTED NOT DETECTED Final   Enterobacter cloacae complex NOT DETECTED NOT DETECTED Final   Escherichia coli NOT  DETECTED NOT DETECTED Final   Klebsiella oxytoca NOT DETECTED NOT DETECTED Final   Klebsiella pneumoniae NOT DETECTED NOT DETECTED Final   Proteus species NOT DETECTED NOT DETECTED Final   Serratia marcescens NOT DETECTED NOT DETECTED Final   Haemophilus influenzae NOT DETECTED NOT DETECTED Final   Neisseria meningitidis NOT DETECTED NOT DETECTED Final   Pseudomonas aeruginosa NOT DETECTED NOT DETECTED Final   Candida albicans NOT DETECTED NOT DETECTED Final   Candida glabrata NOT DETECTED NOT DETECTED Final   Candida krusei NOT DETECTED NOT DETECTED Final   Candida parapsilosis NOT DETECTED NOT DETECTED Final   Candida tropicalis NOT DETECTED NOT DETECTED Final    Comment: Performed at [facility], [place] [address] [place], [place] [address]    Radiographs and labs were personally reviewed by me.   Bobby Rumpf, MD [facility] [phone] 05/04/2018, 2:07 PM

## 2018-05-04 NOTE — ED Notes (Signed)
Pt reports that he feels like he needs to clear his throat but is unable to do so. He denies SOB. No angioedema noted.

## 2018-05-04 NOTE — Progress Notes (Signed)
PROGRESS NOTE    Lance [name]  YIR:[id number] [name]: 13-Jul-1940 DOA: 05/03/2018 PCP: Lavone Orn, MD   Brief Narrative:   Lance [name] is Lance [name] 78 y.o. male with medical history significant of MS, paraplegia, decubitus ulcers of sacral area as well as heels, CHF, ESRD.  Anemia of CKD requiring weekly transfusion.  Patient presents to the ED with c/o weakness, cough, fever to 102 at home.  Given 1gm tylenol PTA.  No N/V/D.  Had dialysis Monday this week due to holiday.  Normally has dialysis TTS.  Assessment & Plan:   Principal Problem:   Fever Active Problems:   Multiple sclerosis (HCC)   Sacral decubitus ulcer   Progressive anemia   Spastic quadriplegia (HCC)   DM (diabetes mellitus) type II controlled with renal manifestation (HCC)   ESRD on hemodialysis (HCC)   Symptomatic anemia   MRSA Bacteremia  Sacral Decubitus Ulcer  Fever: No osteo seen on imaging, but his decub is foul smelling and on discussion with rads there was evidence of inflammation, possible myositis ("diffuse edema within muscles of pelvis and buttocks"). 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  Follow SPEP/UPEP/light chains.  I discussed these findings with possibility of IR for bx, but he notes he'd prefer to discuss with palliative care first.   Goals of Care: pt notes to me that he's "tired" and wants to talk to palliative care about his goals of care.   Palliative care consult  Aspiration: Pt observed to aspirate by nursing.  NPO for now, sounds like this is not new based on discussion with nurse.   SLP, CXR  Anemia: s/p 2 units pRBC, follow.  Negative FOBT.  ESRD: nephrology c/s, appreciate recs  MS: pt is bedbound, takes baclofen (in setting of ESRD)  T2DM: continue levemir 5 units and sensitive SSI  HTN: holding coreg at this time,  continue midodrine with dialysis  DVT prophylaxis: SCD Code Status: DNR Family Communication: called pt daughter Disposition Plan: pending improvement  Consultants:   Infectious Disease  Palliative Care   Nephrology  Procedures:   None  Antimicrobials:  Anti-infectives (From admission, onward)   Start     Dose/Rate Route Frequency Ordered Stop   05/05/18 1200  vancomycin (VANCOCIN) IVPB 750 mg/150 ml premix     750 mg 150 mL/hr over 60 Minutes Intravenous Every T-Th-Sa (Hemodialysis) 05/04/18 1449     05/04/18 2000  ceFEPIme (MAXIPIME) 1 g in sodium chloride 0.9 % 100 mL IVPB  Status:  Discontinued     1 g 200 mL/hr over 30 Minutes Intravenous Every 24 hours 05/03/18 1754 05/04/18 1134   05/03/18 1800  vancomycin (VANCOCIN) 500 mg in sodium chloride 0.9 % 100 mL IVPB     500 mg 100 mL/hr over 60 Minutes Intravenous  Once 05/03/18 1754 05/04/18 0142   05/03/18 1745  ceFEPIme (MAXIPIME) 2 g in sodium chloride 0.9 % 100 mL IVPB     2 g 200 mL/hr over 30 Minutes Intravenous  Once 05/03/18 1739 05/03/18 1842   05/03/18 1745  vancomycin (VANCOCIN) IVPB 1000 mg/200 mL premix     1,000 mg 200 mL/hr over 60 Minutes Intravenous  Once 05/03/18 1739 05/03/18 2025     Subjective: He says that he's tired Not sure he wants to proceed with bx Says he wants to talk to palliative  Objective: Vitals:   05/04/18 1100 05/04/18 1115  05/04/18 1130 05/04/18 1145  BP: (!) 141/68 137/68 136/67 129/64  Pulse: 97 100 100 94  Resp: (!) 27 (!) 34 (!) 30 (!) 32  Temp:      TempSrc:      SpO2: 96% 96% 97% 96%  Weight:        Intake/Output Summary (Last 24 hours) at 05/04/2018 1729 Last data filed at 05/04/2018 1300 Gross per 24 hour  Intake 1470 ml  Output 300 ml  Net 1170 ml   Filed Weights   05/03/18 1712  Weight: 72.6 kg    Examination:  General exam: Appears calm, chronically ill appearing Respiratory system: Clear to auscultation. Respiratory effort normal. Cardiovascular  system: S1 & S2 heard, RRR. Gastrointestinal system: Abdomen is nondistended, soft and nontender.  Central nervous system: Alert and oriented.  Extremities: no lee Skin: decubitus ulcer Psychiatry: Judgement and insight appear normal. Mood & affect appropriate.     Data Reviewed: I have personally reviewed following labs and imaging studies  CBC: Recent Labs  Lab 05/03/18 1536 05/04/18 0430 05/04/18 1702  WBC 10.3 11.6* 12.0*  NEUTROABS 8.9*  --   --   HGB 5.8* 8.5* 8.9*  HCT 19.7* 27.5* 29.0*  MCV 101.0* 100.0 96.7  PLT 146* 164 267*   Basic Metabolic Panel: Recent Labs  Lab 05/03/18 1536 05/04/18 0430  NA 136 136  K 3.7 4.3  CL 97* 98  CO2 27 24  GLUCOSE 114* 203*  BUN 29* 41*  CREATININE 2.45* 2.97*  CALCIUM 7.9* 8.3*   GFR: Estimated Creatinine Clearance: 21.4 mL/min (Lillyanne Bradburn) (by C-G formula based on SCr of 2.97 mg/dL (H)). Liver Function Tests: Recent Labs  Lab 05/03/18 1536 05/04/18 1501  AST 17 22  ALT 10 12  ALKPHOS 62 62  BILITOT 0.6 0.5  PROT 6.5 6.8  ALBUMIN 1.5* 1.5*   No results for input(s): LIPASE, AMYLASE in the last 168 hours. No results for input(s): AMMONIA in the last 168 hours. Coagulation Profile: No results for input(s): INR, PROTIME in the last 168 hours. Cardiac Enzymes: No results for input(s): CKTOTAL, CKMB, CKMBINDEX, TROPONINI in the last 168 hours. BNP (last 3 results) No results for input(s): PROBNP in the last 8760 hours. HbA1C: No results for input(s): HGBA1C in the last 72 hours. CBG: Recent Labs  Lab 05/03/18 2221 05/04/18 1644  GLUCAP 189* 125*   Lipid Profile: No results for input(s): CHOL, HDL, LDLCALC, TRIG, CHOLHDL, LDLDIRECT in the last 72 hours. Thyroid Function Tests: No results for input(s): TSH, T4TOTAL, FREET4, T3FREE, THYROIDAB in the last 72 hours. Anemia Panel: Recent Labs    05/04/18 1501  VITAMINB12 914  FOLATE 8.8  FERRITIN >1,500*  TIBC NOT CALCULATED  IRON 24*   Sepsis Labs: Recent  Labs  Lab 05/03/18 1552 05/03/18 1828  LATICACIDVEN 1.52 0.96    Recent Results (from the past 240 hour(s))  Blood Culture (routine x 2)     Status: None (Preliminary result)   Collection Time: 05/03/18  3:37 PM  Result Value Ref Range Status   Specimen Description BLOOD LEFT HAND  Final   Special Requests   Final    BOTTLES DRAWN AEROBIC AND ANAEROBIC Blood Culture adequate volume   Culture  Setup Time   Final    GRAM POSITIVE COCCI IN CLUSTERS IN BOTH AEROBIC AND ANAEROBIC BOTTLES CRITICAL VALUE NOTED.  VALUE IS CONSISTENT WITH PREVIOUSLY REPORTED AND CALLED VALUE. Performed at [facility], [place] [address] [place], [place] [address]  Culture GRAM POSITIVE COCCI  Final   Report Status PENDING  Incomplete  Blood Culture (routine x 2)     Status: None (Preliminary result)   Collection Time: 05/03/18  3:50 PM  Result Value Ref Range Status   Specimen Description BLOOD LEFT FOREARM  Final   Special Requests   Final    BOTTLES DRAWN AEROBIC AND ANAEROBIC Blood Culture results may not be optimal due to an excessive volume of blood received in culture bottles   Culture  Setup Time   Final    GRAM POSITIVE COCCI IN BOTH AEROBIC AND ANAEROBIC BOTTLES Organism ID to follow CRITICAL RESULT CALLED TO, READ BACK BY AND VERIFIED WITH: Drema Dallas PHARMD, AT 1004 05/04/18 BY D. VANHOOK Performed at [facility], [place] [address] [place], [place] [address]    Culture GRAM POSITIVE COCCI  Final   Report Status PENDING  Incomplete  Blood Culture ID Panel (Reflexed)     Status: Abnormal   Collection Time: 05/03/18  3:50 PM  Result Value Ref Range Status   Enterococcus species NOT DETECTED NOT DETECTED Final   Listeria monocytogenes NOT DETECTED NOT DETECTED Final   Staphylococcus species DETECTED (Tanis Burnley) NOT DETECTED Final    Comment: CRITICAL RESULT CALLED TO, READ BACK BY AND VERIFIED WITH: J. TESSIN PHARMD, AT 1004 05/04/18 BY D. VANHOOK    Staphylococcus aureus (BCID) DETECTED (Marceline Napierala)  NOT DETECTED Final    Comment: Methicillin (oxacillin)-resistant Staphylococcus aureus (MRSA). MRSA is predictably resistant to beta-lactam antibiotics (except ceftaroline). Preferred therapy is vancomycin unless clinically contraindicated. Patient requires contact precautions if  hospitalized. CRITICAL RESULT CALLED TO, READ BACK BY AND VERIFIED WITH: J. TESSIN PHARMD, AT 1004 05/04/18 BY D. VANHOOK    Methicillin resistance DETECTED (Cherron Blitzer) NOT DETECTED Final    Comment: CRITICAL RESULT CALLED TO, READ BACK BY AND VERIFIED WITH: J. TESSIN PHARMD, AT 1004 05/04/18 BY D. VANHOOK    Streptococcus species NOT DETECTED NOT DETECTED Final   Streptococcus agalactiae NOT DETECTED NOT DETECTED Final   Streptococcus pneumoniae NOT DETECTED NOT DETECTED Final   Streptococcus pyogenes NOT DETECTED NOT DETECTED Final   Acinetobacter baumannii NOT DETECTED NOT DETECTED Final   Enterobacteriaceae species NOT DETECTED NOT DETECTED Final   Enterobacter cloacae complex NOT DETECTED NOT DETECTED Final   Escherichia coli NOT DETECTED NOT DETECTED Final   Klebsiella oxytoca NOT DETECTED NOT DETECTED Final   Klebsiella pneumoniae NOT DETECTED NOT DETECTED Final   Proteus species NOT DETECTED NOT DETECTED Final   Serratia marcescens NOT DETECTED NOT DETECTED Final   Haemophilus influenzae NOT DETECTED NOT DETECTED Final   Neisseria meningitidis NOT DETECTED NOT DETECTED Final   Pseudomonas aeruginosa NOT DETECTED NOT DETECTED Final   Candida albicans NOT DETECTED NOT DETECTED Final   Candida glabrata NOT DETECTED NOT DETECTED Final   Candida krusei NOT DETECTED NOT DETECTED Final   Candida parapsilosis NOT DETECTED NOT DETECTED Final   Candida tropicalis NOT DETECTED NOT DETECTED Final    Comment: Performed at [facility], [address] [place], [place] [address]  MRSA PCR Screening     Status: None   Collection Time: 05/04/18 12:38 PM  Result Value Ref Range Status   MRSA by PCR NEGATIVE NEGATIVE  Final    Comment:        The GeneXpert MRSA Assay (FDA approved for NASAL specimens only), is one component of Livia Tarr comprehensive MRSA colonization surveillance program. It is not intended to diagnose MRSA infection nor to  guide or monitor treatment for MRSA infections. Performed at [facility], [place] [address], [place], [place] [address]          Radiology Studies: Mr Pelvis Wo Contrast  Result Date: 05/04/2018 CLINICAL DATA:  Pressure ulcer. EXAM: MRI PELVIS WITHOUT CONTRAST TECHNIQUE: Multiplanar multisequence MR imaging of the pelvis was performed. No intravenous contrast was administered. COMPARISON:  MRI dated 02/14/2016 FINDINGS: Musculoskeletal: There appears to be Effa Yarrow mass destroying the S2 segment of the sacrum and extending into the sacral spinal canal. This was not present on the prior study. Does the patient have Jerrianne Hartin history of malignancy? If not, plasmocytoma should be considered. The sacrum appears to have been resected below the third sacral segment. Prominent sacral decubitus ulcer is just below the third sacral segment. No discrete osteomyelitis. There is new diffuse mottling of the bone marrow of the pelvic bones remnants of the sacrum. Urinary Tract:  Foley catheter in place.  Thickened bladder wall. Bowel:  Unremarkable visualized pelvic bowel loops. Vascular/Lymphatic: No pathologically enlarged lymph nodes. No significant vascular abnormality seen. Reproductive:  No mass or other significant abnormality Other: There is Cambryn Charters diffuse edema within the muscles of the pelvis and buttocks. IMPRESSION: 1. New mass destroying the second sacral segment and extending into the sacral spinal canal. This is consistent with tumor. Does the patient have any known malignancy? If not, the possibility of plasmacytoma should be considered. 2. Deep sacral decubitus ulcer with previous resection of the distal sacrum and coccyx. No findings suggestive of current osteomyelitis. Electronically Signed    By: Lorriane Shire M.D.   On: 05/04/2018 10:37   Dg Chest Port 1 View  Result Date: 05/03/2018 CLINICAL DATA:  Weakness with fever. EXAM: PORTABLE CHEST 1 VIEW COMPARISON:  04/18/2018. FINDINGS: Borderline cardiac enlargement. LEFT pleural effusion appears increased. Slight retrocardiac density may be persistent. Streaky BILATERAL interstitial markings represent emphysematous change particularly at the lung apices. No definite consolidation. IMPRESSION: 1. Increasing LEFT pleural effusion. Electronically Signed   By: Staci Righter M.D.   On: 05/03/2018 15:31        Scheduled Meds: . [START ON 05/05/2018] Chlorhexidine Gluconate Cloth  6 each Topical Q0600  . [START ON 05/05/2018] doxercalciferol  1 mcg Intravenous Q T,Th,Sa-HD  . feeding supplement (NEPRO CARB STEADY)  237 mL Oral BID BM  . feeding supplement (PRO-STAT SUGAR FREE 64)  30 mL Oral BID  . insulin aspart  0-9 Units Subcutaneous TID WC  . insulin detemir  5 Units Subcutaneous Q breakfast  . multivitamin  1 tablet Oral QHS  . simvastatin  20 mg Oral QPM   Continuous Infusions: . sodium chloride    . sodium chloride    . sodium chloride    . [START ON 05/05/2018] vancomycin       LOS: 1 day    Time spent: over 30 min    Fayrene Helper, MD [facility] Pager [phone]  If 7PM-7AM, please contact night-coverage [email or web address] Password [email or web address] 05/04/2018, 5:29 PM

## 2018-05-04 NOTE — ED Notes (Signed)
Patient transported to MRI 

## 2018-05-05 ENCOUNTER — Inpatient Hospital Stay (HOSPITAL_COMMUNITY): Payer: Medicare Other

## 2018-05-05 DIAGNOSIS — T17908S Unspecified foreign body in respiratory tract, part unspecified causing other injury, sequela: Secondary | ICD-10-CM

## 2018-05-05 DIAGNOSIS — R7881 Bacteremia: Secondary | ICD-10-CM | POA: Diagnosis present

## 2018-05-05 DIAGNOSIS — Z7189 Other specified counseling: Secondary | ICD-10-CM

## 2018-05-05 DIAGNOSIS — B9562 Methicillin resistant Staphylococcus aureus infection as the cause of diseases classified elsewhere: Secondary | ICD-10-CM | POA: Diagnosis present

## 2018-05-05 DIAGNOSIS — I361 Nonrheumatic tricuspid (valve) insufficiency: Secondary | ICD-10-CM

## 2018-05-05 LAB — COMPREHENSIVE METABOLIC PANEL
ALT: 14 U/L (ref 0–44)
AST: 29 U/L (ref 15–41)
Albumin: 1.6 g/dL — ABNORMAL LOW (ref 3.5–5.0)
Alkaline Phosphatase: 78 U/L (ref 38–126)
Anion gap: 12 (ref 5–15)
BUN: 51 mg/dL — ABNORMAL HIGH (ref 8–23)
CO2: 26 mmol/L (ref 22–32)
Calcium: 8.5 mg/dL — ABNORMAL LOW (ref 8.9–10.3)
Chloride: 99 mmol/L (ref 98–111)
Creatinine, Ser: 3.51 mg/dL — ABNORMAL HIGH (ref 0.61–1.24)
GFR, EST AFRICAN AMERICAN: 18 mL/min — AB (ref >60)
GFR, EST NON AFRICAN AMERICAN: 16 mL/min — AB (ref >60)
Glucose, Bld: 96 mg/dL (ref 70–99)
Potassium: 4.2 mmol/L (ref 3.5–5.1)
Sodium: 137 mmol/L (ref 135–145)
Total Bilirubin: 0.9 mg/dL (ref 0.3–1.2)
Total Protein: 7.5 g/dL (ref 6.5–8.1)

## 2018-05-05 LAB — ECHOCARDIOGRAM COMPLETE: Weight: 2560 oz

## 2018-05-05 LAB — GLUCOSE, CAPILLARY
GLUCOSE-CAPILLARY: 83 mg/dL (ref 70–99)
Glucose-Capillary: 88 mg/dL (ref 70–99)
Glucose-Capillary: 87 mg/dL (ref 70–99)
Glucose-Capillary: 194 mg/dL — ABNORMAL HIGH (ref 70–99)

## 2018-05-05 LAB — CBC
HCT: 27.4 % — ABNORMAL LOW (ref 39.0–52.0)
Hemoglobin: 8.6 g/dL — ABNORMAL LOW (ref 13.0–17.0)
MCH: 30.3 pg (ref 26.0–34.0)
MCHC: 31.4 g/dL (ref 30.0–36.0)
MCV: 96.5 fL (ref 80.0–100.0)
Platelets: 135 10*3/uL — ABNORMAL LOW (ref 150–400)
RBC: 2.84 MIL/uL — ABNORMAL LOW (ref 4.22–5.81)
RDW: 17.5 % — ABNORMAL HIGH (ref 11.5–15.5)
WBC: 12.5 10*3/uL — ABNORMAL HIGH (ref 4.0–10.5)
nRBC: 0 % (ref 0.0–0.2)

## 2018-05-05 LAB — PROTEIN ELECTROPHORESIS, SERUM
A/G Ratio: 0.4 — ABNORMAL LOW (ref 0.7–1.7)
Albumin ELP: 1.9 g/dL — ABNORMAL LOW (ref 2.9–4.4)
Alpha-1-Globulin: 0.4 g/dL (ref 0.0–0.4)
Alpha-2-Globulin: 0.9 g/dL (ref 0.4–1.0)
BETA GLOBULIN: 1 g/dL (ref 0.7–1.3)
Gamma Globulin: 2 g/dL — ABNORMAL HIGH (ref 0.4–1.8)
Globulin, Total: 4.3 g/dL — ABNORMAL HIGH (ref 2.2–3.9)
Total Protein ELP: 6.2 g/dL (ref 6.0–8.5)

## 2018-05-05 LAB — MAGNESIUM: Magnesium: 2.1 mg/dL (ref 1.7–2.4)

## 2018-05-05 MED ORDER — INSULIN DETEMIR 100 UNIT/ML ~~LOC~~ SOLN
4.0000 [IU] | Freq: Every day | SUBCUTANEOUS | Status: DC
  Filled 2018-05-05 (×2): qty 0.04

## 2018-05-05 MED ORDER — HALOPERIDOL LACTATE 5 MG/ML IJ SOLN: 0.5000 mg | INTRAMUSCULAR | Status: DC | PRN

## 2018-05-05 MED ORDER — GLYCOPYRROLATE 0.2 MG/ML IJ SOLN: 0.2000 mg | INTRAMUSCULAR | Status: DC | PRN

## 2018-05-05 MED ORDER — HALOPERIDOL 0.5 MG PO TABS
0.5000 mg | ORAL_TABLET | ORAL | Status: DC | PRN
  Filled 2018-05-05: qty 1

## 2018-05-05 MED ORDER — SODIUM CHLORIDE 0.9 % IV SOLN: 250.0000 mL | INTRAVENOUS | Status: DC | PRN

## 2018-05-05 MED ORDER — SODIUM CHLORIDE 0.9% FLUSH: 3.0000 mL | INTRAVENOUS | Status: DC | PRN

## 2018-05-05 MED ORDER — BIOTENE DRY MOUTH MT LIQD: 15.0000 mL | OROMUCOSAL | Status: DC | PRN

## 2018-05-05 MED ORDER — VANCOMYCIN HCL IN DEXTROSE 750-5 MG/150ML-% IV SOLN
INTRAVENOUS | Status: AC
  Administered 2018-05-05: 750 mg via INTRAVENOUS
  Filled 2018-05-05: qty 150

## 2018-05-05 MED ORDER — HALOPERIDOL LACTATE 2 MG/ML PO CONC
0.5000 mg | ORAL | Status: DC | PRN
  Filled 2018-05-05: qty 0.3

## 2018-05-05 MED ORDER — POLYVINYL ALCOHOL 1.4 % OP SOLN
1.0000 [drp] | Freq: Four times a day (QID) | OPHTHALMIC | Status: DC | PRN
  Filled 2018-05-05: qty 15

## 2018-05-05 MED ORDER — DAKINS (1/4 STRENGTH) 0.125 % EX SOLN
Freq: Two times a day (BID) | CUTANEOUS | Status: DC
  Administered 2018-05-05: 12:00:00
  Filled 2018-05-05: qty 473

## 2018-05-05 MED ORDER — SODIUM CHLORIDE 0.9% FLUSH
3.0000 mL | Freq: Two times a day (BID) | INTRAVENOUS | Status: DC
  Administered 2018-05-05 – 2018-05-06 (×3): 3 mL via INTRAVENOUS

## 2018-05-05 MED ORDER — GLYCOPYRROLATE 0.2 MG/ML IJ SOLN
0.2000 mg | INTRAMUSCULAR | Status: DC | PRN
  Administered 2018-05-07: 0.2 mg via INTRAVENOUS
  Filled 2018-05-05: qty 1

## 2018-05-05 MED ORDER — HYDROMORPHONE HCL 1 MG/ML PO LIQD
1.0000 mg | ORAL | Status: DC | PRN
  Administered 2018-05-07 (×3): 1 mg via ORAL
  Filled 2018-05-05 (×3): qty 1

## 2018-05-05 MED ORDER — LORAZEPAM 2 MG/ML IJ SOLN
0.2500 mg | INTRAMUSCULAR | Status: DC | PRN
  Administered 2018-05-06 (×2): 0.25 mg via INTRAVENOUS
  Filled 2018-05-05 (×2): qty 1

## 2018-05-05 MED ORDER — COLLAGENASE 250 UNIT/GM EX OINT
TOPICAL_OINTMENT | Freq: Every day | CUTANEOUS | Status: DC
  Administered 2018-05-05: 12:00:00 via TOPICAL
  Filled 2018-05-05: qty 30

## 2018-05-05 MED ORDER — GLYCOPYRROLATE 1 MG PO TABS: 1.0000 mg | ORAL_TABLET | ORAL | Status: DC | PRN

## 2018-05-05 NOTE — Consult Note (Signed)
[facility] Nurse wound consult note Patient receiving care in [facility] [phone].  No family present.  Low air loss mattress in use. Reason for Consult: Multiple wounds Wound type: The right foot first metatarsal head, plantar surface has an unstageable PI measuring 2.3 cm x 2.8 xm.  It is 99% black eschar with 1% slough.  It currently has a foam dressing over it. The left lateral mid-foot has a stage 3 PI that measures 4.3 cm x 2.4 cm x 0.5 cm.  It is 56% pink, slick, with 1% black eschar along a portion of the wound margin. It is covered with a foam dressing. Bilateral heels are pink and blanch and have intact tissue.  The patient has heel lift boots on bilaterally. The sacral wound is highly necrotic and has a strong foul odor.  It measures 10.2 cm x 9 cm x 2.1 cm and has visible necrotic bone at the level of the coccyx. It also has visible pockets of necrotic tissue along the undermining edge. Just inferior to this wound is an unstageable PI with a very small margin of intact tissue just above the anal opening.  This wound measures 1.5 cm x 3.6 cm and is 100% yellow and brown slough.   The right ischium has a highly stenosed wound that measures 1 cm x 2.2 cm x 1.1 cm.  I cannot actually see the wound bed, only palpate it with a cotton tipped swab. The left ischium has a necrotic wound that is a stage 4 PI.  It measures 2 cm x 3.6 cm x 2.6 cm.  All of the sacral, coccyx, ischial wounds had packing and foam dressings over them.   Pressure Injury POA: Yes Dressing procedure/placement/frequency: Moisten roll gauze with the Dakin's solution.  Pack into the following wounds:  sacrum, right and left ischium, and left lateral foot. Cover with ABD pads. Tape in place. Apply Santyl to the right plantar foot wound in a nickel thick layer. Cover with a saline moistened gauze, then dry gauze or ABD pad.  Change daily. Thank you for the consult.  Discussed plan of care with the patient and bedside nurse.  [place] nurse will not follow at  this time.  Please re-consult the [place] team if needed.  Val Riles, RN, MSN, CWOCN, CNS-BC, pager [phone]

## 2018-05-05 NOTE — CV Procedure (Signed)
2D echo attempted but patient said he would rather eat. Will try echo later

## 2018-05-05 NOTE — Consult Note (Addendum)
Consultation Note Date: 05/05/2018   Patient Name: Lance [name]  [name]: 1940-08-23  MRN: [id number]  Age / Sex: 78 y.o., male  PCP: Lance Orn, MD Referring Physician: Albertine Patricia, MD  Reason for Consultation: Establishing goals of care and Psychosocial/spiritual support  HPI/Patient Profile: 78 y.o. male  with past medical history of ESRD on HD, MS with spastic paraplegia, CHF, decubitus ulcerations of his sacrum and heels (pathology of bone biopsy in 02/2018 indicates osteomyelitis) who was admitted on 05/03/2018 with MRSA bacteremia.  Palliative initially met Lance [name] on Sep 07, 2017.  Please refer to the consult note by Lance Sill, NP for more information.  Clinical Assessment and Goals of Care:  I have reviewed medical records including EPIC notes, labs and imaging, assessed the patient and then spoke with him about Lance [name], EOL wishes, disposition and options.  From Lance [name]'s previous consult note I am aware that Lance [name] has no fear of dying.  His biggest concern is ensuring that his son who is developmentally delayed and his wife who has the early stages of dementia or cared for.  He also has 4 other children who live out of state.    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  He is Lance devoted Lance [name].  As I am in his room is Lance [name] arrives from [place] to deliver the Sacrament's of the sick.    The patient is extremely weak.  His words are few and carefully chosen.  Once he understands I am from palliative medicine he says "I am ready to go" he goes on to tell me he just wants to be kept alive until his children can arrive from out of state and that he wants to be at [name] after his children arrive.  We attempted to call his son Lance [name] together, but I  ended up speaking to Lance [name] later outside of the patient's room.  Lance [name] is his Air traffic controller.  I explained to Lance [name] that his father is dying and what his wishes were.  I then talked to Lance [name] briefly about MRSA bacteremia and the possibility of plasmacytoma.  We talked about his father being transferred to hospice house after the children arrive in [name] and we talked about his father's concerns over caring for his wife and mentally disabled son.  Lance [name] stated he understood and that he will be at the hospital around 2:30 PM today.  Lance [name] will also contact his siblings and encouraged them to travel here as quickly as possible.  Questions and concerns were addressed.  The family was encouraged to call with questions or concerns.   Palliative will continue to follow-up with Mr. Lance [name].  I am hopeful that the rest of his children will arrive in [name] tomorrow and that he could be transferred to hospice house at that time   Primary Decision Maker:  PATIENT.  His son Lance [name]  is his surrogate medical decision maker    SUMMARY OF RECOMMENDATIONS    He will continue taking hemodialysis treatments until his children arrive from out of state.  After they have arrived she will stop hemodialysis and wants to go to hospice house for end-of-life care.  Will discontinue any unnecessary interventions at this point and focus on his comfort other than hemodialysis.   Code Status/Advance Care Planning:  DNR/DNI   Symptom Management:   Sips of ice cold water.  Additional Recommendations (Limitations, Scope, Preferences):  Minimize Medications, Initiate Comfort Feeding, No Artificial Feeding and No Blood Transfusions  Palliative Prophylaxis:   Aspiration and Delirium Protocol  Psycho-social/Spiritual:   Desire for further Chaplaincy support: Lance [name] is present  Prognosis:  Hours.      Discharge Planning: I am concerned about the strong possibility of Lance hospital death.   However the patient has requested transfer to hospice house after his children arrive      Primary Diagnoses: Present on Admission: . Sacral decubitus ulcer . Multiple sclerosis ([place]) . Spastic quadriplegia ([place]) . DM (diabetes mellitus) type II controlled with renal manifestation ([place]) . Progressive anemia . Fever . Symptomatic anemia . MRSA bacteremia   I have reviewed the medical record, interviewed the patient and family, and examined the patient. The following aspects are pertinent.  Past Medical History:  Diagnosis Date  . Acute CHF (congestive heart failure) ([place]) 02/14/2016  . Acute osteomyelitis, pelvis, right ([place])   . Acute renal failure ([place])   . Anemia in chronic renal disease   . Blood transfusion   . Cardiomyopathy ([place]) 02/17/2016  . Chronic spastic paraplegia    secondary to MS  . Decreased sensation of lower extremity    due to MS  . Decubitus ulcer of ischium   . Decubitus ulcer of trochanter, stage 4 ([place]) 06/14/2015  . ESRD (end stage renal disease) ([place])    [place]  . History of kidney stones    x2  . History of MRSA infection    urine  . History of recurrent UTIs   . Hypertension   . MS (multiple sclerosis) ([place])   . Multiple sclerosis ([place])    stated by client in triage   . Neuralgia neuritis, sciatic nerve    MS for 30 years  . Neurogenic bladder   . PONV (postoperative nausea and vomiting)   . Presence of indwelling urinary catheter   . Self-catheterizes urinary bladder   . Type 2 diabetes mellitus ([place])    Social History   Socioeconomic History  . Marital status: Married    Spouse name: Not on file  . Number of children: Not on file  . Years of education: Not on file  . Highest education level: Not on file  Occupational History  . Not on file  Social Needs  . Financial resource strain: Not on file  . Food insecurity:    Worry: Not on file    Inability: Not on file  . Transportation needs:    Medical: Not  on file    Non-medical: Not on file  Tobacco Use  . Smoking status: Former Smoker    Types: Cigars    Last attempt to quit: 05/06/1983    Years since quitting: 35.0  . Smokeless tobacco: Never Used  Substance and Sexual Activity  . Alcohol use: No    Alcohol/week: 1.0 standard drinks    Types: 1 Glasses of wine per week    Comment:  pt has not had any since Dec 2017  . Drug use: No  . Sexual activity: Not Currently    Birth control/protection: None  Lifestyle  . Physical activity:    Days per week: 0 days    Minutes per session: 0 min  . Stress: Not at all  Relationships  . Social connections:    Talks on phone: Patient refused    Gets together: Patient refused    Attends religious service: Patient refused    Active member of club or organization: Patient refused    Attends meetings of clubs or organizations: Patient refused    Relationship status: Patient refused  Other Topics Concern  . Not on file  Social History Narrative  . Not on file   Family History  Problem Relation Age of Onset  . Stroke Father   . Diabetes Father   . Diabetes Paternal Grandmother   . Stroke Paternal Grandfather   . Heart disease Neg Hx   . Cancer Neg Hx    Scheduled Meds: . Chlorhexidine Gluconate Cloth  6 each Topical Q0600  . collagenase   Topical Daily  . doxercalciferol  1 mcg Intravenous Q T,Th,Sa-HD  . feeding supplement (NEPRO CARB STEADY)  237 mL Oral BID BM  . feeding supplement (PRO-STAT SUGAR FREE 64)  30 mL Oral BID  . insulin aspart  0-9 Units Subcutaneous TID WC  . insulin detemir  5 Units Subcutaneous Q breakfast  . multivitamin  1 tablet Oral QHS  . simvastatin  20 mg Oral QPM  . sodium hypochlorite   Irrigation BID   Continuous Infusions: . sodium chloride    . sodium chloride    . sodium chloride    . vancomycin     PRN Meds:.sodium chloride, sodium chloride, acetaminophen **OR** acetaminophen, alteplase, lidocaine (PF), lidocaine-prilocaine, midodrine,  ondansetron **OR** ondansetron (ZOFRAN) IV, pentafluoroprop-tetrafluoroeth No Known Allergies Review of Systems patient is too weak and fatigued to give Lance full review of systems.  In general he tells me he is not doing well  Physical Exam  Pale, frail, elderly gentleman who appears chronically ill.  Awake alert, oriented, coherent, pleasant CV decreased heart sounds no murmurs rubs or gallops detected Respiratory patient is dyspneic when speaking Abdomen soft, nontender, nondistended  Vital Signs: BP 126/70 (BP Location: Left Arm)   Pulse 88   Temp 97.8 F (36.6 C) (Oral)   Resp 18   Wt 72.6 kg Comment: estimate for antibiotic dosing  SpO2 97%   BMI 19.48 kg/m  Pain Scale: 0-10   Pain Score: 0-No pain   SpO2: SpO2: 97 % O2 Device:SpO2: 97 % O2 Flow Rate: .O2 Flow Rate (L/min): 0.5 L/min  IO: Intake/output summary:   Intake/Output Summary (Last 24 hours) at 05/05/2018 1310 Last data filed at 05/05/2018 1025 Gross per 24 hour  Intake 180 ml  Output -  Net 180 ml    LBM: Last BM Date: 05/03/18 Baseline Weight: Weight: 72.6 kg(estimate for antibiotic dosing) Most recent weight: Weight: 72.6 kg(estimate for antibiotic dosing)     Palliative Assessment/Data: 20 percent   Flowsheet Rows     Most Recent Value  Intake Tab  Referral Department  Hospitalist  Unit at Time of Referral  ER  Date Notified  05/04/18  Palliative Care Type  Return patient Palliative Care  Reason for referral  Clarify Goals of Care  Date of Admission  05/03/18  # of days IP prior to Palliative referral  1  Clinical Assessment  Psychosocial & Spiritual Assessment  Palliative Care Outcomes      Time In: 1230 Time Out: 140 Time Total: 70 minutes Greater than 50%  of this time was spent counseling and coordinating care related to the above assessment and plan.  Signed by: Florentina Jenny, PA-C Palliative Medicine Pager: [phone]  Please contact Palliative Medicine Team phone at  [phone] for questions and concerns.  For individual provider: See Shea Evans

## 2018-05-05 NOTE — Progress Notes (Signed)
Patient ID: Lance [name], male   [name]: Jul 19, 1940, 78 y.o.   MRN: [id number]          [facility]    Date of Admission:  05/03/2018           Day 3 vancomycin  He has MRSA bacteremia in the setting of MS and a chronic sacral decubitus wound.  Repeat blood cultures are negative at 24 hours and TTE is pending.  Palliative care consultation has been ordered.  I will continue vancomycin for now.         Michel Bickers, MD [facility] [place] [facility] [phone] pager   [phone] cell 05/05/2018, 11:23 AM

## 2018-05-05 NOTE — Progress Notes (Signed)
  Echocardiogram 2D Echocardiogram has been performed.  Lance [name] 05/05/2018, 2:39 PM

## 2018-05-05 NOTE — Evaluation (Signed)
Clinical/Bedside Swallow Evaluation Patient Details  Name: Lance [name] MRN: [id number] Date of Birth: October 29, 1940  Today's Date: 05/05/2018 Time: SLP Start Time (ACUTE ONLY): 5329 SLP Stop Time (ACUTE ONLY): 0926 SLP Time Calculation (min) (ACUTE ONLY): 35 min  Past Medical History:  Past Medical History:  Diagnosis Date  . Acute CHF (congestive heart failure) ([place]) 02/14/2016  . Acute osteomyelitis, pelvis, right ([place])   . Acute renal failure ([place])   . Anemia in chronic renal disease   . Blood transfusion   . Cardiomyopathy ([place]) 02/17/2016  . Chronic spastic paraplegia    secondary to MS  . Decreased sensation of lower extremity    due to MS  . Decubitus ulcer of ischium   . Decubitus ulcer of trochanter, stage 4 ([place]) 06/14/2015  . ESRD (end stage renal disease) ([place])    [place]  . History of kidney stones    x2  . History of MRSA infection    urine  . History of recurrent UTIs   . Hypertension   . MS (multiple sclerosis) ([place])   . Multiple sclerosis ([place])    stated by client in triage   . Neuralgia neuritis, sciatic nerve    MS for 30 years  . Neurogenic bladder   . PONV (postoperative nausea and vomiting)   . Presence of indwelling urinary catheter   . Self-catheterizes urinary bladder   . Type 2 diabetes mellitus ([place])    Past Surgical History:  Past Surgical History:  Procedure Laterality Date  . AMPUTATION Left 01/22/2017   Procedure: Left 5th Ray Amputation;  Surgeon: Newt Minion, MD;  Location: [place];  Service: Orthopedics;  Laterality: Left;  . APPLICATION OF A-CELL OF EXTREMITY Left 09/05/2014   Procedure: PLACEMENT OF ACELL AND VAC;  Surgeon: Theodoro Kos, DO;  Location: [place];  Service: Plastics;  Laterality: Left;  . BASCILIC VEIN TRANSPOSITION Right 03/24/2016   Procedure: RADIAL- CEPHALIC FISTULA CREATION RIGHT ARM;  Surgeon: Conrad Rhinelander, MD;  Location: [place];  Service: Vascular;  Laterality: Right;  . Buttocks flap Left    . COLONOSCOPY    . EXTRACORPOREAL SHOCK WAVE LITHOTRIPSY  03/23/2011   x2  . FINGER SURGERY  2004  . hip flap Right   . INCISION AND DRAINAGE OF WOUND Left 09/05/2014   Procedure: IRRIGATION AND DEBRIDEMENT OF LEFT ISCHIUM WOUND WITH ;  Surgeon: Theodoro Kos, DO;  Location: [place];  Service: Plastics;  Laterality: Left;  . INSERTION OF DIALYSIS CATHETER N/A 02/27/2016   Procedure: INSERTION OF DIALYSIS CATHETER-RIGHT INTERNAL JUGULA PLACEMENT;  Surgeon: Conrad Edna Bay, MD;  Location: [place];  Service: Vascular;  Laterality: N/A;  . TONSILLECTOMY    . WOUND DEBRIDEMENT     [facility]    HPI:  Lance Power Rockeleinis a 78 y.o.malewith medical history significant ofMS, paraplegia, decubitus ulcers of sacral area as well as heels, CHF, ESRD. Anemia of CKD requiring weekly transfusion. Patient presents to the ED with c/o weakness, cough, fever to 102 at home. Normally has dialysis TTS.  Suspected to have MRSA Bacteremia  Sacral Decubitus Ulcer  Fever: No osteo seen on imaging, but his decub is foul smelling and on discussion with rads there was evidence of inflammation, possible myositis ("diffuse edema within muscles of pelvis and buttocks"). Observed to aspirate with RN. Consult to palliative care. He also does not have a uvula, as he reports it was removed as a child - he denies dysphagia as a result  of surgery.     Assessment / Plan / Recommendation Clinical Impression  Pt demonstrates signs of significant dysphagia with high risk of aspiration. He is dysphonic, his cough is severely weak, and late coughing after consumption of PO suggests delayed sensation of aspiration with inability to eject. Pt is fully aware of this presentation. Offered option of objective testing, pt immediately knew that he did not want to undergo any testing or diet modification. He clearly states that he wants to be comfortable and wants to stop all aggressive measures. SLP offered basic strategy of following bites  with sips to aid in clearing sensation of residue after solids. He reports his appetite has been poor for a long time and he does not want most solid foods. Assisted pt in ordering foods and drinks of choice, including orange juice which is typically restricted for people on dialysis. Pt again verbalizes that he is not interested in restrictions anymore, would rather not worry about it anymore, he is tired. Given pts wishes will sign off with diet in place. Discussed with RN.  SLP Visit Diagnosis: Dysphagia, oropharyngeal phase (R13.12)    Aspiration Risk  Severe aspiration risk;Risk for inadequate nutrition/hydration    Diet Recommendation Regular;Thin liquid   Liquid Administration via: Straw Medication Administration: Whole meds with liquid Supervision: Full supervision/cueing for compensatory strategies;Comment(total assist feeding) Compensations: Slow rate;Small sips/bites;Follow solids with liquid Postural Changes: (ok to lay back for PO)    Other  Recommendations Oral Care Recommendations: Oral care BID Other Recommendations: Have oral suction available   Follow up Recommendations None      Frequency and Duration            Prognosis        Swallow Study   General HPI: Lance Common Rockeleinis a 78 y.o.malewith medical history significant ofMS, paraplegia, decubitus ulcers of sacral area as well as heels, CHF, ESRD. Anemia of CKD requiring weekly transfusion. Patient presents to the ED with c/o weakness, cough, fever to 102 at home. Normally has dialysis TTS.  Suspected to have MRSA Bacteremia  Sacral Decubitus Ulcer  Fever: No osteo seen on imaging, but his decub is foul smelling and on discussion with rads there was evidence of inflammation, possible myositis ("diffuse edema within muscles of pelvis and buttocks"). Observed to aspirate with RN. Consult to palliative care. He also does not have a uvula, as he reports it was removed as a child - he denies dysphagia as a  result of surgery.   Type of Study: Bedside Swallow Evaluation Diet Prior to this Study: NPO Temperature Spikes Noted: No Respiratory Status: Room air History of Recent Intubation: No Behavior/Cognition: Alert;Cooperative;Pleasant mood Oral Cavity Assessment: Within Functional Limits Oral Cavity - Dentition: Adequate natural dentition Vision: Functional for self-feeding Self-Feeding Abilities: Total assist Patient Positioning: Postural control interferes with function Baseline Vocal Quality: Hoarse;Breathy;Low vocal intensity Volitional Cough: Weak Volitional Swallow: Able to elicit    Oral/Motor/Sensory Function Overall Oral Motor/Sensory Function: Within functional limits   Ice Chips     Thin Liquid Thin Liquid: Impaired Presentation: Straw;Cup;Self Fed Pharyngeal  Phase Impairments: Cough - Delayed    Nectar Thick Nectar Thick Liquid: Not tested   Honey Thick Honey Thick Liquid: Not tested   Puree Puree: Impaired Pharyngeal Phase Impairments: Multiple swallows;Wet Vocal Quality;Cough - Delayed   Solid     Solid: Not tested     Herbie Baltimore, MA CCC-SLP  Acute Rehabilitation Services Pager [phone] Office [phone]  Lynann Beaver 05/05/2018,9:34 AM

## 2018-05-05 NOTE — Progress Notes (Signed)
PROGRESS NOTE    Lance [name]  XTG:[id number] [name]: May 03, 1941 DOA: 05/03/2018 PCP: Lavone Orn, MD   Brief Narrative:   Lance [name] is a 78 y.o. male with medical history significant of MS, paraplegia, decubitus ulcers of sacral area as well as heels, CHF, ESRD.  Anemia of CKD requiring weekly transfusion.  Patient presents to the ED with c/o weakness, cough, fever to 102 at home.  Given 1gm tylenol PTA.  No N/V/D.  Had dialysis Monday this week due to holiday.  Normally has dialysis TTS.  Subjective: -Reports he is tired today, but palliative, he was seen by speech, reports he wants to continue his diet regardless to risk aspiration.  Assessment & Plan:   Principal Problem:   Fever Active Problems:   Multiple sclerosis (HCC)   Sacral decubitus ulcer   Progressive anemia   Spastic quadriplegia (HCC)   DM (diabetes mellitus) type II controlled with renal manifestation (HCC)   ESRD on hemodialysis (HCC)   Symptomatic anemia   MRSA Bacteremia  Sacral Decubitus Ulcer  Fever: -There is no evidence of osteomyelitis on imaging, this is very likely in the setting of infected sacral decubitus ulcer,  -The input greatly appreciated, continue with vancomycin  -Follow on repeat blood cultures  -air Flow bed mattress -On 2D echo, giving her frequency, no indication for TEE -ID input greatly appreciated  Sacral Mass: -Consistent with tumor, 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  Dysphagia/risk of aspiration: -Shunt was seen by SLP, he is at risk of aspiration, he is aware of that, does not want any further testing, he wants to be comfortable, he wants to be resumed back on diet, he is aware of his .  Anemia: s/p 2 units pRBC, follow.  Negative  FOBT.  ESRD: nephrology c/s, appreciate recs  MS: pt is bedbound, takes baclofen (in setting of ESRD)  T2DM: continue levemir 5 units and sensitive SSI  HTN: holding coreg at this time, continue midodrine with dialysis  Goals of Care: Reports he is tired, and he is interested in palliative care , they have been consulted .  DVT prophylaxis: SCD Code Status: DNR Family Communication: none at bedside Disposition Plan: pending improvement  Consultants:   Infectious Disease  Palliative Care   Nephrology  Procedures:   None  Antimicrobials:  Anti-infectives (From admission, onward)   Start     Dose/Rate Route Frequency Ordered Stop   05/05/18 1200  vancomycin (VANCOCIN) IVPB 750 mg/150 ml premix     750 mg 150 mL/hr over 60 Minutes Intravenous Every T-Th-Sa (Hemodialysis) 05/04/18 1449     05/04/18 2000  ceFEPIme (MAXIPIME) 1 g in sodium chloride 0.9 % 100 mL IVPB  Status:  Discontinued     1 g 200 mL/hr over 30 Minutes Intravenous Every 24 hours 05/03/18 1754 05/04/18 1134   05/03/18 1800  vancomycin (VANCOCIN) 500 mg in sodium chloride 0.9 % 100 mL IVPB     500 mg 100 mL/hr over 60 Minutes Intravenous  Once 05/03/18 1754 05/04/18 0142   05/03/18 1745  ceFEPIme (MAXIPIME) 2 g in sodium chloride 0.9 % 100 mL IVPB     2 g 200 mL/hr over 30 Minutes Intravenous  Once 05/03/18 1739 05/03/18 1842   05/03/18 1745  vancomycin (VANCOCIN) IVPB 1000 mg/200 mL premix  1,000 mg 200 mL/hr over 60 Minutes Intravenous  Once 05/03/18 1739 05/03/18 2025       Objective: Vitals:   05/04/18 1145 05/04/18 2204 05/05/18 0313 05/05/18 0641  BP: 129/64 116/63  126/70  Pulse: 94 (!) 105  88  Resp: (!) 32 17  18  Temp:  (!) 97.5 F (36.4 C) 97.7 F (36.5 C) 97.8 F (36.6 C)  TempSrc:  Oral Oral Oral  SpO2: 96% 100%  97%  Weight:        Intake/Output Summary (Last 24 hours) at 05/05/2018 1003 Last data filed at 05/04/2018 1300 Gross per 24 hour  Intake 300 ml  Output 300 ml    Net 0 ml   Filed Weights   05/03/18 1712  Weight: 72.6 kg    Examination:  Awake Alert, Oriented X 3, namely frail, chronically ill-appearing male, laying in bed in no apparent distress  Symmetrical Chest wall movement, Good air movement bilaterally, CTAB RRR,No Gallops,Rubs or new Murmurs, No Parasternal Heave +ve B.Sounds, Abd Soft, No tenderness, No rebound - guarding or rigidity. No Cyanosis, Clubbing or edema,  has decubitus ulcer     Data Reviewed: I have personally reviewed following labs and imaging studies  CBC: Recent Labs  Lab 05/03/18 1536 05/04/18 0430 05/04/18 1702 05/05/18 0431  WBC 10.3 11.6* 12.0* 12.5*  NEUTROABS 8.9*  --   --   --   HGB 5.8* 8.5* 8.9* 8.6*  HCT 19.7* 27.5* 29.0* 27.4*  MCV 101.0* 100.0 96.7 96.5  PLT 146* 164 146* 017*   Basic Metabolic Panel: Recent Labs  Lab 05/03/18 1536 05/04/18 0430 05/04/18 1702 05/05/18 0431  NA 136 136 134* 137  K 3.7 4.3 4.3 4.2  CL 97* 98 98 99  CO2 27 24 24 26   GLUCOSE 114* 203* 139* 96  BUN 29* 41* 57* 51*  CREATININE 2.45* 2.97* 3.09* 3.51*  CALCIUM 7.9* 8.3* 8.4* 8.5*  MG  --   --   --  2.1  PHOS  --   --  3.0  --    GFR: Estimated Creatinine Clearance: 18.1 mL/min (A) (by C-G formula based on SCr of 3.51 mg/dL (H)). Liver Function Tests: Recent Labs  Lab 05/03/18 1536 05/04/18 1501 05/04/18 1702 05/05/18 0431  AST 17 22  --  29  ALT 10 12  --  14  ALKPHOS 62 62  --  78  BILITOT 0.6 0.5  --  0.9  PROT 6.5 6.8  --  7.5  ALBUMIN 1.5* 1.5* 1.6* 1.6*   No results for input(s): LIPASE, AMYLASE in the last 168 hours. No results for input(s): AMMONIA in the last 168 hours. Coagulation Profile: No results for input(s): INR, PROTIME in the last 168 hours. Cardiac Enzymes: No results for input(s): CKTOTAL, CKMB, CKMBINDEX, TROPONINI in the last 168 hours. BNP (last 3 results) No results for input(s): PROBNP in the last 8760 hours. HbA1C: No results for input(s): HGBA1C in the last  72 hours. CBG: Recent Labs  Lab 05/03/18 2221 05/04/18 1644 05/04/18 2159 05/05/18 0513 05/05/18 0816  GLUCAP 189* 125* 82 88 87   Lipid Profile: No results for input(s): CHOL, HDL, LDLCALC, TRIG, CHOLHDL, LDLDIRECT in the last 72 hours. Thyroid Function Tests: No results for input(s): TSH, T4TOTAL, FREET4, T3FREE, THYROIDAB in the last 72 hours. Anemia Panel: Recent Labs    05/04/18 1501  VITAMINB12 914  FOLATE 8.8  FERRITIN >1,500*  TIBC NOT CALCULATED  IRON 24*   Sepsis Labs: Recent Labs  Lab 05/03/18 1552 05/03/18 1828  LATICACIDVEN 1.52 0.96    Recent Results (from the past 240 hour(s))  Blood Culture (routine x 2)     Status: Abnormal (Preliminary result)   Collection Time: 05/03/18  3:37 PM  Result Value Ref Range Status   Specimen Description BLOOD LEFT HAND  Final   Special Requests   Final    BOTTLES DRAWN AEROBIC AND ANAEROBIC Blood Culture adequate volume   Culture  Setup Time   Final    GRAM POSITIVE COCCI IN CLUSTERS IN BOTH AEROBIC AND ANAEROBIC BOTTLES CRITICAL VALUE NOTED.  VALUE IS CONSISTENT WITH PREVIOUSLY REPORTED AND CALLED VALUE. Performed at [facility], [place] [address], [place], [place] [address]    Culture STAPHYLOCOCCUS AUREUS (A)  Final   Report Status PENDING  Incomplete  Blood Culture (routine x 2)     Status: Abnormal (Preliminary result)   Collection Time: 05/03/18  3:50 PM  Result Value Ref Range Status   Specimen Description BLOOD LEFT FOREARM  Final   Special Requests   Final    BOTTLES DRAWN AEROBIC AND ANAEROBIC Blood Culture results may not be optimal due to an excessive volume of blood received in culture bottles   Culture  Setup Time   Final    GRAM POSITIVE COCCI IN BOTH AEROBIC AND ANAEROBIC BOTTLES CRITICAL RESULT CALLED TO, READ BACK BY AND VERIFIED WITH: Drema Dallas PHARMD, AT 1004 05/04/18 BY D. VANHOOK Performed at [facility], [place] [address], [place], [place] [address]    Culture STAPHYLOCOCCUS AUREUS  (A)  Final   Report Status PENDING  Incomplete  Blood Culture ID Panel (Reflexed)     Status: Abnormal   Collection Time: 05/03/18  3:50 PM  Result Value Ref Range Status   Enterococcus species NOT DETECTED NOT DETECTED Final   Listeria monocytogenes NOT DETECTED NOT DETECTED Final   Staphylococcus species DETECTED (A) NOT DETECTED Final    Comment: CRITICAL RESULT CALLED TO, READ BACK BY AND VERIFIED WITH: J. TESSIN PHARMD, AT 1004 05/04/18 BY D. VANHOOK    Staphylococcus aureus (BCID) DETECTED (A) NOT DETECTED Final    Comment: Methicillin (oxacillin)-resistant Staphylococcus aureus (MRSA). MRSA is predictably resistant to beta-lactam antibiotics (except ceftaroline). Preferred therapy is vancomycin unless clinically contraindicated. Patient requires contact precautions if  hospitalized. CRITICAL RESULT CALLED TO, READ BACK BY AND VERIFIED WITH: J. TESSIN PHARMD, AT 1004 05/04/18 BY D. VANHOOK    Methicillin resistance DETECTED (A) NOT DETECTED Final    Comment: CRITICAL RESULT CALLED TO, READ BACK BY AND VERIFIED WITH: J. TESSIN PHARMD, AT 1004 05/04/18 BY D. VANHOOK    Streptococcus species NOT DETECTED NOT DETECTED Final   Streptococcus agalactiae NOT DETECTED NOT DETECTED Final   Streptococcus pneumoniae NOT DETECTED NOT DETECTED Final   Streptococcus pyogenes NOT DETECTED NOT DETECTED Final   Acinetobacter baumannii NOT DETECTED NOT DETECTED Final   Enterobacteriaceae species NOT DETECTED NOT DETECTED Final   Enterobacter cloacae complex NOT DETECTED NOT DETECTED Final   Escherichia coli NOT DETECTED NOT DETECTED Final   Klebsiella oxytoca NOT DETECTED NOT DETECTED Final   Klebsiella pneumoniae NOT DETECTED NOT DETECTED Final   Proteus species NOT DETECTED NOT DETECTED Final   Serratia marcescens NOT DETECTED NOT DETECTED Final   Haemophilus influenzae NOT DETECTED NOT DETECTED Final   Neisseria meningitidis NOT DETECTED NOT DETECTED Final   Pseudomonas aeruginosa NOT DETECTED NOT  DETECTED Final   Candida albicans NOT DETECTED NOT DETECTED Final  Candida glabrata NOT DETECTED NOT DETECTED Final   Candida krusei NOT DETECTED NOT DETECTED Final   Candida parapsilosis NOT DETECTED NOT DETECTED Final   Candida tropicalis NOT DETECTED NOT DETECTED Final    Comment: Performed at [facility], [place] [address] [place], [place] [address]  Urine culture     Status: Abnormal (Preliminary result)   Collection Time: 05/03/18  7:29 PM  Result Value Ref Range Status   Specimen Description URINE, CATHETERIZED  Final   Special Requests   Final    NONE Performed at [facility], [place] [address], [place], [place] [address]    Culture (A)  Final    >=100,000 COLONIES/mL UNIDENTIFIED ORGANISM >=100,000 COLONIES/mL GRAM NEGATIVE RODS    Report Status PENDING  Incomplete  MRSA PCR Screening     Status: None   Collection Time: 05/04/18 12:38 PM  Result Value Ref Range Status   MRSA by PCR NEGATIVE NEGATIVE Final    Comment:        The GeneXpert MRSA Assay (FDA approved for NASAL specimens only), is one component of a comprehensive MRSA colonization surveillance program. It is not intended to diagnose MRSA infection nor to guide or monitor treatment for MRSA infections. Performed at [facility], [place] [address] [place], [place] [address]          Radiology Studies: Dg Chest 2 View  Result Date: 05/04/2018 CLINICAL DATA:  Aspiration EXAM: CHEST - 2 VIEW COMPARISON:  Chest radiograph from one day prior. FINDINGS: Stable cardiomediastinal silhouette with top-normal heart size. No pneumothorax. Stable small left pleural effusion. No significant right pleural effusion. No overt pulmonary edema. Worsening patchy left lung base opacity. IMPRESSION: 1. Worsening patchy left lung base opacity, which could represent aspiration and/or pneumonia. 2. Stable small left pleural effusion. Electronically Signed   By: Ilona Sorrel M.D.   On: 05/04/2018 21:53   Mr Pelvis  Wo Contrast  Result Date: 05/04/2018 CLINICAL DATA:  Pressure ulcer. EXAM: MRI PELVIS WITHOUT CONTRAST TECHNIQUE: Multiplanar multisequence MR imaging of the pelvis was performed. No intravenous contrast was administered. COMPARISON:  MRI dated 02/14/2016 FINDINGS: Musculoskeletal: There appears to be a mass destroying the S2 segment of the sacrum and extending into the sacral spinal canal. This was not present on the prior study. Does the patient have a history of malignancy? If not, plasmocytoma should be considered. The sacrum appears to have been resected below the third sacral segment. Prominent sacral decubitus ulcer is just below the third sacral segment. No discrete osteomyelitis. There is new diffuse mottling of the bone marrow of the pelvic bones remnants of the sacrum. Urinary Tract:  Foley catheter in place.  Thickened bladder wall. Bowel:  Unremarkable visualized pelvic bowel loops. Vascular/Lymphatic: No pathologically enlarged lymph nodes. No significant vascular abnormality seen. Reproductive:  No mass or other significant abnormality Other: There is a diffuse edema within the muscles of the pelvis and buttocks. IMPRESSION: 1. New mass destroying the second sacral segment and extending into the sacral spinal canal. This is consistent with tumor. Does the patient have any known malignancy? If not, the possibility of plasmacytoma should be considered. 2. Deep sacral decubitus ulcer with previous resection of the distal sacrum and coccyx. No findings suggestive of current osteomyelitis. Electronically Signed   By: Lorriane Shire M.D.   On: 05/04/2018 10:37   Dg Chest Port 1 View  Result Date: 05/03/2018 CLINICAL DATA:  Weakness with fever. EXAM: PORTABLE CHEST 1 VIEW COMPARISON:  04/18/2018. FINDINGS: Borderline cardiac enlargement. LEFT pleural effusion appears increased. Slight retrocardiac density may be persistent. Streaky BILATERAL interstitial markings represent emphysematous change  particularly at the lung apices. No definite consolidation. IMPRESSION: 1. Increasing LEFT pleural effusion. Electronically Signed   By: Staci Righter M.D.   On: 05/03/2018 15:31        Scheduled Meds: . Chlorhexidine Gluconate Cloth  6 each Topical Q0600  . collagenase   Topical Daily  . doxercalciferol  1 mcg Intravenous Q T,Th,Sa-HD  . feeding supplement (NEPRO CARB STEADY)  237 mL Oral BID BM  . feeding supplement (PRO-STAT SUGAR FREE 64)  30 mL Oral BID  . insulin aspart  0-9 Units Subcutaneous TID WC  . insulin detemir  5 Units Subcutaneous Q breakfast  . multivitamin  1 tablet Oral QHS  . simvastatin  20 mg Oral QPM  . sodium hypochlorite   Irrigation BID   Continuous Infusions: . sodium chloride    . sodium chloride    . sodium chloride    . vancomycin       LOS: 2 days      Phillips Climes, MD [facility] Pager [phone]  If 7PM-7AM, please contact night-coverage [email or web address] Password [email or web address] 05/05/2018, 10:03 AM

## 2018-05-05 NOTE — Progress Notes (Signed)
[facility] Progress Note   Subjective: Asking when Palliative Care will arrive. Says he is tired. Agrees to HD today. Awaiting family to arrive. No C/Os pain at present.    Objective Vitals:   05/04/18 1145 05/04/18 2204 05/05/18 0313 05/05/18 0641  BP: 129/64 116/63  126/70  Pulse: 94 (!) 105  88  Resp: (!) 32 17  18  Temp:  (!) 97.5 F (36.4 C) 97.7 F (36.5 C) 97.8 F (36.6 C)  TempSrc:  Oral Oral Oral  SpO2: 96% 100%  97%  Weight:       Physical Exam General: Chronically ill appearing elderly male in NAD Heart: S1,S2, RRR  Lungs: Bilateral breath sounds decreased in bases otherwise CTAB GI/GI: Active BS, Foley patent to gravity.  Extremities: Bilateral heel protectors in place. Trace BLE edema.  Dialysis Access: R AVF + bruit   Additional Objective Labs: Basic Metabolic Panel: Recent Labs  Lab 05/04/18 0430 05/04/18 1702 05/05/18 0431  NA 136 134* 137  K 4.3 4.3 4.2  CL 98 98 99  CO2 24 24 26   GLUCOSE 203* 139* 96  BUN 41* 57* 51*  CREATININE 2.97* 3.09* 3.51*  CALCIUM 8.3* 8.4* 8.5*  PHOS  --  3.0  --    Liver Function Tests: Recent Labs  Lab 05/03/18 1536 05/04/18 1501 05/04/18 1702 05/05/18 0431  AST 17 22  --  29  ALT 10 12  --  14  ALKPHOS 62 62  --  78  BILITOT 0.6 0.5  --  0.9  PROT 6.5 6.8  --  7.5  ALBUMIN 1.5* 1.5* 1.6* 1.6*   No results for input(s): LIPASE, AMYLASE in the last 168 hours. CBC: Recent Labs  Lab 05/03/18 1536 05/04/18 0430 05/04/18 1702 05/05/18 0431  WBC 10.3 11.6* 12.0* 12.5*  NEUTROABS 8.9*  --   --   --   HGB 5.8* 8.5* 8.9* 8.6*  HCT 19.7* 27.5* 29.0* 27.4*  MCV 101.0* 100.0 96.7 96.5  PLT 146* 164 146* 135*   Blood Culture    Component Value Date/Time   SDES URINE, CATHETERIZED 05/03/2018 1929   SPECREQUEST  05/03/2018 1929    NONE Performed at [facility], [place] [address], [place], [place] [address]    CULT (A) 05/03/2018 1929    >=100,000 COLONIES/mL UNIDENTIFIED  ORGANISM >=100,000 COLONIES/mL GRAM NEGATIVE RODS    REPTSTATUS PENDING 05/03/2018 1929    Cardiac Enzymes: No results for input(s): CKTOTAL, CKMB, CKMBINDEX, TROPONINI in the last 168 hours. CBG: Recent Labs  Lab 05/03/18 2221 05/04/18 1644 05/04/18 2159 05/05/18 0513 05/05/18 0816  GLUCAP 189* 125* 82 88 87   Iron Studies:  Recent Labs    05/04/18 1501  IRON 24*  TIBC NOT CALCULATED  FERRITIN >1,500*   @lablastinr3 @ Studies/Results: Dg Chest 2 View  Result Date: 05/04/2018 CLINICAL DATA:  Aspiration EXAM: CHEST - 2 VIEW COMPARISON:  Chest radiograph from one day prior. FINDINGS: Stable cardiomediastinal silhouette with top-normal heart size. No pneumothorax. Stable small left pleural effusion. No significant right pleural effusion. No overt pulmonary edema. Worsening patchy left lung base opacity. IMPRESSION: 1. Worsening patchy left lung base opacity, which could represent aspiration and/or pneumonia. 2. Stable small left pleural effusion. Electronically Signed   By: Ilona Sorrel M.D.   On: 05/04/2018 21:53   Mr Pelvis Wo Contrast  Result Date: 05/04/2018 CLINICAL DATA:  Pressure ulcer. EXAM: MRI PELVIS WITHOUT CONTRAST TECHNIQUE: Multiplanar multisequence MR imaging of the pelvis was performed. No  intravenous contrast was administered. COMPARISON:  MRI dated 02/14/2016 FINDINGS: Musculoskeletal: There appears to be a mass destroying the S2 segment of the sacrum and extending into the sacral spinal canal. This was not present on the prior study. Does the patient have a history of malignancy? If not, plasmocytoma should be considered. The sacrum appears to have been resected below the third sacral segment. Prominent sacral decubitus ulcer is just below the third sacral segment. No discrete osteomyelitis. There is new diffuse mottling of the bone marrow of the pelvic bones remnants of the sacrum. Urinary Tract:  Foley catheter in place.  Thickened bladder wall. Bowel:  Unremarkable  visualized pelvic bowel loops. Vascular/Lymphatic: No pathologically enlarged lymph nodes. No significant vascular abnormality seen. Reproductive:  No mass or other significant abnormality Other: There is a diffuse edema within the muscles of the pelvis and buttocks. IMPRESSION: 1. New mass destroying the second sacral segment and extending into the sacral spinal canal. This is consistent with tumor. Does the patient have any known malignancy? If not, the possibility of plasmacytoma should be considered. 2. Deep sacral decubitus ulcer with previous resection of the distal sacrum and coccyx. No findings suggestive of current osteomyelitis. Electronically Signed   By: Lorriane Shire M.D.   On: 05/04/2018 10:37   Dg Chest Port 1 View  Result Date: 05/03/2018 CLINICAL DATA:  Weakness with fever. EXAM: PORTABLE CHEST 1 VIEW COMPARISON:  04/18/2018. FINDINGS: Borderline cardiac enlargement. LEFT pleural effusion appears increased. Slight retrocardiac density may be persistent. Streaky BILATERAL interstitial markings represent emphysematous change particularly at the lung apices. No definite consolidation. IMPRESSION: 1. Increasing LEFT pleural effusion. Electronically Signed   By: Staci Righter M.D.   On: 05/03/2018 15:31   Medications: . sodium chloride    . sodium chloride    . sodium chloride    . vancomycin     . Chlorhexidine Gluconate Cloth  6 each Topical Q0600  . collagenase   Topical Daily  . doxercalciferol  1 mcg Intravenous Q T,Th,Sa-HD  . feeding supplement (NEPRO CARB STEADY)  237 mL Oral BID BM  . feeding supplement (PRO-STAT SUGAR FREE 64)  30 mL Oral BID  . insulin aspart  0-9 Units Subcutaneous TID WC  . insulin detemir  5 Units Subcutaneous Q breakfast  . multivitamin  1 tablet Oral QHS  . simvastatin  20 mg Oral QPM  . sodium hypochlorite   Irrigation BID     Dialysis Orders: [place] T,Th,S 3.5 hrs 400/800  2.0 K/2.0 Ca UFP 2 -No heparin  -Hectorol 1 mcg IV TIW  -Mircera  225 mcg IV q 2 weeks,  last 12/26  Recent labs: hgb 6.9 12/30 12% sat early Dec s/p short course ferritin ~1400 iPTH 171 Ca 8.3 P 2.3 alb 2.5   Assessment/Plan: 1. Fever/MRSA Bacteremia - MRSA positive on blood culture -? Source - wounds/urine - on Vanc and Maxepime. ID consulted.  2. ESRD -  TTS-HD today on schedule. K+ 4.2 No heparin  3. BP/volume  - requires midodrine for BP support - needs lowering of EDW 4. Anemia  - hgb 5.8 upon admission - transfused 2 units PRBC up to 8.5 this am post transfusion - -recurrent frequent transfusions - may be related to newly discovered pelvic mass. Transfuse prn - hold on resuming ESA for now. HGB 8.6 today.  5. Metabolic bone disease -  Continue hectorol- resume powdered renvela pending P levels 6. Nutrition -added supplements/vitamins  7. Sacral decubitus - chronic/foot wounds - wound  care RN to eval -   Wounds not examined 8.   MS with paraplegia/neurogenic bladder - chronic foley UTIs 9.   DM - Levimir + SSI 10. New pelvic mass with destruction of seg of sacrum and extending into spinal canal - (have not discussed with him yet) 11. Goals of care - have initiated palliative care consult per pt request - new findings on pelvic MRI may help direct decisions. Patient is tired but want to wait for family input before stopping HD. Discussed this with patient.    H.  NP-C 05/05/2018, 11:04 AM  [employer] [phone]

## 2018-05-06 ENCOUNTER — Encounter (HOSPITAL_COMMUNITY): Payer: Medicare Other

## 2018-05-06 ENCOUNTER — Encounter (HOSPITAL_BASED_OUTPATIENT_CLINIC_OR_DEPARTMENT_OTHER): Payer: Medicare Other | Attending: Internal Medicine

## 2018-05-06 DIAGNOSIS — Z515 Encounter for palliative care: Secondary | ICD-10-CM

## 2018-05-06 LAB — KAPPA/LAMBDA LIGHT CHAINS
Kappa free light chain: 456 mg/L — ABNORMAL HIGH (ref 3.3–19.4)
Kappa, lambda light chain ratio: 1.08 (ref 0.26–1.65)
Lambda free light chains: 424.1 mg/L — ABNORMAL HIGH (ref 5.7–26.3)

## 2018-05-06 LAB — URINE CULTURE: Culture: >=100000 — AB

## 2018-05-06 LAB — GLUCOSE, CAPILLARY: Glucose-Capillary: 73 mg/dL (ref 70–99)

## 2018-05-06 NOTE — Progress Notes (Signed)
Spoke with son on the phone and then at bedside.  Met with 3 daughters as well.  Patient was alert and talking overnight with his children.  They report having really good time together.    We discussed engaging hospice.  We discussed his symptoms.  No further hemodialysis.  Family is firm in their decision for Hospice (they specify on [place]).    Patient awake but lethargic and weak.  No complaints, no signs of acute distress.  Discussed with Attending MD and Social work as well as Occupational psychologist.  Florentina Jenny, PA-C Palliative Medicine Pager: [phone]   25 min.

## 2018-05-06 NOTE — Progress Notes (Signed)
CSW received referral regarding residential hospice placement. CSW sent referral to family's first choice, [facility]. They are currently full, but will keep CSW updated.   Percell Locus Timmya Blazier LCSW [phone]

## 2018-05-06 NOTE — Progress Notes (Signed)
[facility] [place]  Received request from [place] for placement at [facility].  Unfortunately [facility] does not have a bed to offer today.  Spoke with family, acknowledged referral, answered questions and offered emotional support.  Advised [facility] will follow up with them and LCSW daily and as bed availability changes.  Thank you for this referral, Venia Carbon BSN, RN [facility] Liaison (listed in [place]) [phone]

## 2018-05-06 NOTE — Progress Notes (Signed)
[facility])  [facility] will have a bed to offer on 05/07/2018 am.  Spoke with LCSW and made aware that discharge summary can be obtained.  [facility] hospital liaison will meet with family in room at 0830 and notify LCSW when transport can be arranged.  LCSW and family updated with plan.  Thank you, Venia Carbon BSN, [facility] Liaison (listed in [place]) [phone]

## 2018-05-06 NOTE — Progress Notes (Signed)
[facility] Progress Note   Subjective: Seen in room. Weak, speaks few words. Son at bedside. Waiting for other family members to arrive.   Objective Vitals:   05/05/18 1911 05/05/18 2011 05/05/18 2014 05/06/18 0501  BP: 114/87 108/66  119/75  Pulse: (!) 135 (!) 136 (!) 115 70  Resp:  18  18  Temp: (!) 97.5 F (36.4 C) 97.8 F (36.6 C)  98.1 F (36.7 C)  TempSrc:  Oral  Oral  SpO2: 98% 98%  100%  Weight:       Physical Exam General: Chronically ill appearing elderly male in NAD Heart: S1,S2, RRR  Lungs: Decreased breath sounds anteriorly  GI/GI: Soft NT Extremities: Bilateral heel protectors in place.  Dialysis Access: R AVF + bruit   Additional Objective Labs: Basic Metabolic Panel: Recent Labs  Lab 05/04/18 0430 05/04/18 1702 05/05/18 0431  NA 136 134* 137  K 4.3 4.3 4.2  CL 98 98 99  CO2 24 24 26   GLUCOSE 203* 139* 96  BUN 41* 57* 51*  CREATININE 2.97* 3.09* 3.51*  CALCIUM 8.3* 8.4* 8.5*  PHOS  --  3.0  --    Liver Function Tests: Recent Labs  Lab 05/03/18 1536 05/04/18 1501 05/04/18 1702 05/05/18 0431  AST 17 22  --  29  ALT 10 12  --  14  ALKPHOS 62 62  --  78  BILITOT 0.6 0.5  --  0.9  PROT 6.5 6.8  --  7.5  ALBUMIN 1.5* 1.5* 1.6* 1.6*   No results for input(s): LIPASE, AMYLASE in the last 168 hours. CBC: Recent Labs  Lab 05/03/18 1536 05/04/18 0430 05/04/18 1702 05/05/18 0431  WBC 10.3 11.6* 12.0* 12.5*  NEUTROABS 8.9*  --   --   --   HGB 5.8* 8.5* 8.9* 8.6*  HCT 19.7* 27.5* 29.0* 27.4*  MCV 101.0* 100.0 96.7 96.5  PLT 146* 164 146* 135*   Blood Culture    Component Value Date/Time   SDES BLOOD LEFT HAND 05/04/2018 1504   SPECREQUEST  05/04/2018 1504    BOTTLES DRAWN AEROBIC ONLY Blood Culture adequate volume   CULT  05/04/2018 1504    NO GROWTH 2 DAYS Performed at [facility] [address], [place], [place] [address]    REPTSTATUS PENDING 05/04/2018 1504    Cardiac Enzymes: No results for  input(s): CKTOTAL, CKMB, CKMBINDEX, TROPONINI in the last 168 hours. CBG: Recent Labs  Lab 05/05/18 0513 05/05/18 0816 05/05/18 1150 05/05/18 2008 05/06/18 0813  GLUCAP 88 87 194* 83 73   Iron Studies:  Recent Labs    05/04/18 1501  IRON 24*  TIBC NOT CALCULATED  FERRITIN >1,500*   @lablastinr3 @ Studies/Results: Dg Chest 2 View  Result Date: 05/04/2018 CLINICAL DATA:  Aspiration EXAM: CHEST - 2 VIEW COMPARISON:  Chest radiograph from one day prior. FINDINGS: Stable cardiomediastinal silhouette with top-normal heart size. No pneumothorax. Stable small left pleural effusion. No significant right pleural effusion. No overt pulmonary edema. Worsening patchy left lung base opacity. IMPRESSION: 1. Worsening patchy left lung base opacity, which could represent aspiration and/or pneumonia. 2. Stable small left pleural effusion. Electronically Signed   By: Ilona Sorrel M.D.   On: 05/04/2018 21:53   Medications: . sodium chloride    . sodium chloride    . vancomycin 750 mg (05/05/18 1737)   . Chlorhexidine Gluconate Cloth  6 each Topical Q0600  . collagenase   Topical Daily  . feeding supplement (NEPRO CARB STEADY)  237 mL Oral BID BM  . insulin detemir  4 Units Subcutaneous Q breakfast  . sodium chloride flush  3 mL Intravenous Q12H     Dialysis Orders: [place] T,Th,S 3.5 hrs 400/800  2.0 K/2.0 Ca UFP 2 -No heparin  -Hectorol 1 mcg IV TIW  -Mircera 225 mcg IV q 2 weeks,  last 12/26  Recent labs: hgb 6.9 12/30 12% sat early Dec s/p short course ferritin ~1400 iPTH 171 Ca 8.3 P 2.3 alb 2.5   Assessment/Plan: 1. Fever/MRSA Bacteremia - MRSA positive on blood culture -? Source - wounds/urine - on Vanc. ID following. TTE pending  2. ESRD -  TTS-HD. K 4.2. Next HD due 1/4. Will discuss with patient/family in am before writing orders.  3. BP/volume  - on midodrine for BP support.  4. Anemia  - Hgb 8.6 s/p  2 units PRBCs -recurrent frequent transfusions - may be related to newly  discovered pelvic mass. Transfuse prn - hold on resuming ESA for now. .  5. Metabolic bone disease -  Ca/Phos ok. On Hectorol. Hold binder for now.  6. Nutrition -added supplements/vitamins  7. Sacral decubitus - chronic/foot wounds - 8.   MS with paraplegia/neurogenic bladder - chronic foley UTIs 9.   DM - Levimir + SSI 10. New pelvic mass with destruction of seg of sacrum and extending into spinal canal - (have not discussed with him yet) 11. Goals of care- Palliative care involved. Has decided to pursue hospice. Awaiting arrival of family members before stopping HD.   Lynnda Child PA-C [facility] Pager [phone] 05/06/2018,11:17 AM

## 2018-05-06 NOTE — Progress Notes (Signed)
Patient ID: Lance [name], male   [name]: 19-Mar-1941, 78 y.o.   MRN: [id number]          [facility] for Infectious Disease    Date of Admission:  05/03/2018            Mr. Vezina is transitioning to hospice, comfort care.  His vancomycin has been stopped.  I will sign off now.         Michel Bickers, MD [facility] [phone] pager   [phone] cell 05/06/2018, 1:29 PM

## 2018-05-06 NOTE — Progress Notes (Signed)
PROGRESS NOTE    Kadyn Guild Ollinger  LPF:[id number] [name]: 1941/02/21 DOA: 05/03/2018 PCP: Lavone Orn, MD   Brief Narrative:    Korby Ratay Wiegel is a 78 y.o. male with medical history significant of MS, paraplegia, decubitus ulcers of sacral area as well as heels, CHF, ESRD.  Anemia of CKD requiring weekly transfusion.  Patient presents to the ED with c/o weakness, cough, fever to 102 at home.  Given 1gm tylenol PTA.  No N/V/D.  Had dialysis Monday this week due to holiday.  Normally has dialysis TTS.  Subjective: -Patient denies any complaints today, reports he is comfortable with current regimen  Assessment & Plan:   Principal Problem:   Fever Active Problems:   Multiple sclerosis (HCC)   Sacral decubitus ulcer   Progressive anemia   Spastic quadriplegia (HCC)   DM (diabetes mellitus) type II controlled with renal manifestation (HCC)   ESRD on hemodialysis (HCC)   Symptomatic anemia   MRSA bacteremia   MRSA Bacteremia  Sacral Decubitus Ulcer  Fever: -There is no evidence of osteomyelitis on imaging, this is very likely in the setting of infected sacral decubitus ulcer,  -air Flow bed mattress -On 2D echo, giving her frequency, no indication for TEE -ID input greatly appreciated, initially on IV vancomycin, currently comfort care, off antibiotics  Sacral Mass: -Consistent with tumor, 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  Dysphagia/risk of aspiration: -Shunt was seen by SLP, he is at risk of aspiration, he is aware of that, does not want any further testing, he wants to be comfortable, he wants to be resumed back on diet, he is aware of his .  Eating for comfort  Anemia: s/p 2 units pRBC, follow.  Negative FOBT.  ESRD: nephrology c/s,  appreciate recs, now he is comfort care, no further dialysis  MS: pt is bedbound, takes baclofen (in setting of ESRD)  T2DM: No further CBGs order insulin, given his comfort  HTN  Goals of Care: Elective care has been consulted, after discussion with the patient and family, decision has been made for full comfort measures, awaiting bed [facility]  DVT prophylaxis: SCD Code Status: DNR Family Communication: discussed with both daughters at bedside Disposition Plan: [facility] when bed available  Consultants:   Infectious Disease  Palliative Care   Nephrology  Procedures:   None  Antimicrobials:  Anti-infectives (From admission, onward)   Start     Dose/Rate Route Frequency Ordered Stop   05/05/18 1200  vancomycin (VANCOCIN) IVPB 750 mg/150 ml premix  Status:  Discontinued     750 mg 150 mL/hr over 60 Minutes Intravenous Every T-Th-Sa (Hemodialysis) 05/04/18 1449 05/06/18 1132   05/04/18 2000  ceFEPIme (MAXIPIME) 1 g in sodium chloride 0.9 % 100 mL IVPB  Status:  Discontinued     1 g 200 mL/hr over 30 Minutes Intravenous Every 24 hours 05/03/18 1754 05/04/18 1134   05/03/18 1800  vancomycin (VANCOCIN) 500 mg in sodium chloride 0.9 % 100 mL IVPB     500 mg 100 mL/hr over 60 Minutes Intravenous  Once 05/03/18 1754 05/04/18 0142   05/03/18 1745  ceFEPIme (MAXIPIME) 2 g in sodium chloride 0.9 % 100 mL IVPB     2 g 200 mL/hr over 30 Minutes Intravenous  Once 05/03/18 1739 05/03/18 1842  05/03/18 1745  vancomycin (VANCOCIN) IVPB 1000 mg/200 mL premix     1,000 mg 200 mL/hr over 60 Minutes Intravenous  Once 05/03/18 1739 05/03/18 2025       Objective: Vitals:   05/05/18 1911 05/05/18 2011 05/05/18 2014 05/06/18 0501  BP: 114/87 108/66  119/75  Pulse: (!) 135 (!) 136 (!) 115 70  Resp:  18  18  Temp: (!) 97.5 F (36.4 C) 97.8 F (36.6 C)  98.1 F (36.7 C)  TempSrc:  Oral  Oral  SpO2: 98% 98%  100%  Weight:        Intake/Output Summary  (Last 24 hours) at 05/06/2018 1341 Last data filed at 05/06/2018 0456 Gross per 24 hour  Intake 57.5 ml  Output 718 ml  Net -660.5 ml   Filed Weights   05/03/18 1712 05/05/18 1830  Weight: 72.6 kg 71 kg    Examination:  Awake Alert, Oriented X 3, namely frail, chronically ill-appearing male, laying in bed in no apparent distress  Good air entry bilaterally, no wheezing RRR,No Gallops,Rubs or new Murmurs, No Parasternal Heave Demented soft, nontender Images with no cyanosis     Data Reviewed: I have personally reviewed following labs and imaging studies  CBC: Recent Labs  Lab 05/03/18 1536 05/04/18 0430 05/04/18 1702 05/05/18 0431  WBC 10.3 11.6* 12.0* 12.5*  NEUTROABS 8.9*  --   --   --   HGB 5.8* 8.5* 8.9* 8.6*  HCT 19.7* 27.5* 29.0* 27.4*  MCV 101.0* 100.0 96.7 96.5  PLT 146* 164 146* 403*   Basic Metabolic Panel: Recent Labs  Lab 05/03/18 1536 05/04/18 0430 05/04/18 1702 05/05/18 0431  NA 136 136 134* 137  K 3.7 4.3 4.3 4.2  CL 97* 98 98 99  CO2 27 24 24 26   GLUCOSE 114* 203* 139* 96  BUN 29* 41* 57* 51*  CREATININE 2.45* 2.97* 3.09* 3.51*  CALCIUM 7.9* 8.3* 8.4* 8.5*  MG  --   --   --  2.1  PHOS  --   --  3.0  --    GFR: Estimated Creatinine Clearance: 17.7 mL/min (A) (by C-G formula based on SCr of 3.51 mg/dL (H)). Liver Function Tests: Recent Labs  Lab 05/03/18 1536 05/04/18 1501 05/04/18 1702 05/05/18 0431  AST 17 22  --  29  ALT 10 12  --  14  ALKPHOS 62 62  --  78  BILITOT 0.6 0.5  --  0.9  PROT 6.5 6.8  --  7.5  ALBUMIN 1.5* 1.5* 1.6* 1.6*   No results for input(s): LIPASE, AMYLASE in the last 168 hours. No results for input(s): AMMONIA in the last 168 hours. Coagulation Profile: No results for input(s): INR, PROTIME in the last 168 hours. Cardiac Enzymes: No results for input(s): CKTOTAL, CKMB, CKMBINDEX, TROPONINI in the last 168 hours. BNP (last 3 results) No results for input(s): PROBNP in the last 8760 hours. HbA1C: No  results for input(s): HGBA1C in the last 72 hours. CBG: Recent Labs  Lab 05/05/18 0513 05/05/18 0816 05/05/18 1150 05/05/18 2008 05/06/18 0813  GLUCAP 88 87 194* 83 73   Lipid Profile: No results for input(s): CHOL, HDL, LDLCALC, TRIG, CHOLHDL, LDLDIRECT in the last 72 hours. Thyroid Function Tests: No results for input(s): TSH, T4TOTAL, FREET4, T3FREE, THYROIDAB in the last 72 hours. Anemia Panel: Recent Labs    05/04/18 1501  VITAMINB12 914  FOLATE 8.8  FERRITIN >1,500*  TIBC NOT CALCULATED  IRON 24*   Sepsis Labs: Recent  Labs  Lab 05/03/18 1552 05/03/18 1828  LATICACIDVEN 1.52 0.96    Recent Results (from the past 240 hour(s))  Blood Culture (routine x 2)     Status: Abnormal (Preliminary result)   Collection Time: 05/03/18  3:37 PM  Result Value Ref Range Status   Specimen Description BLOOD LEFT HAND  Final   Special Requests   Final    BOTTLES DRAWN AEROBIC AND ANAEROBIC Blood Culture adequate volume   Culture  Setup Time   Final    GRAM POSITIVE COCCI IN CLUSTERS IN BOTH AEROBIC AND ANAEROBIC BOTTLES CRITICAL VALUE NOTED.  VALUE IS CONSISTENT WITH PREVIOUSLY REPORTED AND CALLED VALUE. Performed at [facility], [place] [address], [place], [place] [address]    Culture STAPHYLOCOCCUS AUREUS (A)  Final   Report Status PENDING  Incomplete  Blood Culture (routine x 2)     Status: Abnormal (Preliminary result)   Collection Time: 05/03/18  3:50 PM  Result Value Ref Range Status   Specimen Description BLOOD LEFT FOREARM  Final   Special Requests   Final    BOTTLES DRAWN AEROBIC AND ANAEROBIC Blood Culture results may not be optimal due to an excessive volume of blood received in culture bottles   Culture  Setup Time   Final    GRAM POSITIVE COCCI IN BOTH AEROBIC AND ANAEROBIC BOTTLES CRITICAL RESULT CALLED TO, READ BACK BY AND VERIFIED WITH: Drema Dallas PHARMD, AT 1004 05/04/18 BY D. VANHOOK Performed at [facility], [place] [address], [place], [place]  [address]    Culture (A)  Final    STAPHYLOCOCCUS AUREUS CULTURE REINCUBATED FOR BETTER GROWTH    Report Status PENDING  Incomplete  Blood Culture ID Panel (Reflexed)     Status: Abnormal   Collection Time: 05/03/18  3:50 PM  Result Value Ref Range Status   Enterococcus species NOT DETECTED NOT DETECTED Final   Listeria monocytogenes NOT DETECTED NOT DETECTED Final   Staphylococcus species DETECTED (A) NOT DETECTED Final    Comment: CRITICAL RESULT CALLED TO, READ BACK BY AND VERIFIED WITH: J. TESSIN PHARMD, AT 1004 05/04/18 BY D. VANHOOK    Staphylococcus aureus (BCID) DETECTED (A) NOT DETECTED Final    Comment: Methicillin (oxacillin)-resistant Staphylococcus aureus (MRSA). MRSA is predictably resistant to beta-lactam antibiotics (except ceftaroline). Preferred therapy is vancomycin unless clinically contraindicated. Patient requires contact precautions if  hospitalized. CRITICAL RESULT CALLED TO, READ BACK BY AND VERIFIED WITH: J. TESSIN PHARMD, AT 1004 05/04/18 BY D. VANHOOK    Methicillin resistance DETECTED (A) NOT DETECTED Final    Comment: CRITICAL RESULT CALLED TO, READ BACK BY AND VERIFIED WITH: J. TESSIN PHARMD, AT 1004 05/04/18 BY D. VANHOOK    Streptococcus species NOT DETECTED NOT DETECTED Final   Streptococcus agalactiae NOT DETECTED NOT DETECTED Final   Streptococcus pneumoniae NOT DETECTED NOT DETECTED Final   Streptococcus pyogenes NOT DETECTED NOT DETECTED Final   Acinetobacter baumannii NOT DETECTED NOT DETECTED Final   Enterobacteriaceae species NOT DETECTED NOT DETECTED Final   Enterobacter cloacae complex NOT DETECTED NOT DETECTED Final   Escherichia coli NOT DETECTED NOT DETECTED Final   Klebsiella oxytoca NOT DETECTED NOT DETECTED Final   Klebsiella pneumoniae NOT DETECTED NOT DETECTED Final   Proteus species NOT DETECTED NOT DETECTED Final   Serratia marcescens NOT DETECTED NOT DETECTED Final   Haemophilus influenzae NOT DETECTED NOT DETECTED Final   Neisseria  meningitidis NOT DETECTED NOT DETECTED Final   Pseudomonas aeruginosa NOT DETECTED NOT DETECTED Final  Candida albicans NOT DETECTED NOT DETECTED Final   Candida glabrata NOT DETECTED NOT DETECTED Final   Candida krusei NOT DETECTED NOT DETECTED Final   Candida parapsilosis NOT DETECTED NOT DETECTED Final   Candida tropicalis NOT DETECTED NOT DETECTED Final    Comment: Performed at [facility], [place] [address] [place], [place] [address]  Urine culture     Status: Abnormal   Collection Time: 05/03/18  7:29 PM  Result Value Ref Range Status   Specimen Description URINE, CATHETERIZED  Final   Special Requests   Final    NONE Performed at [facility], [place] [address] [place], [place] [address]    Culture (A)  Final    >=100,000 COLONIES/mL METHICILLIN RESISTANT STAPHYLOCOCCUS AUREUS >=100,000 COLONIES/mL ACINETOBACTER CALCOACETICUS/BAUMANNII COMPLEX    Report Status 05/06/2018 FINAL  Final   Organism ID, Bacteria METHICILLIN RESISTANT STAPHYLOCOCCUS AUREUS (A)  Final   Organism ID, Bacteria ACINETOBACTER CALCOACETICUS/BAUMANNII COMPLEX (A)  Final      Susceptibility   Acinetobacter calcoaceticus/baumannii complex - MIC*    CEFTAZIDIME >=64 RESISTANT Resistant     CEFTRIAXONE >=64 RESISTANT Resistant     CIPROFLOXACIN >=4 RESISTANT Resistant     GENTAMICIN 4 SENSITIVE Sensitive     IMIPENEM 1 SENSITIVE Sensitive     PIP/TAZO >=128 RESISTANT Resistant     TRIMETH/SULFA >=320 RESISTANT Resistant     CEFEPIME >=64 RESISTANT Resistant     AMPICILLIN/SULBACTAM <=2 SENSITIVE Sensitive     * >=100,000 COLONIES/mL ACINETOBACTER CALCOACETICUS/BAUMANNII COMPLEX   Methicillin resistant staphylococcus aureus - MIC*    CIPROFLOXACIN >=8 RESISTANT Resistant     GENTAMICIN <=0.5 SENSITIVE Sensitive     NITROFURANTOIN <=16 SENSITIVE Sensitive     OXACILLIN >=4 RESISTANT Resistant     TETRACYCLINE <=1 SENSITIVE Sensitive     VANCOMYCIN 1 SENSITIVE Sensitive     TRIMETH/SULFA >=320  RESISTANT Resistant     CLINDAMYCIN <=0.25 SENSITIVE Sensitive     RIFAMPIN <=0.5 SENSITIVE Sensitive     Inducible Clindamycin NEGATIVE Sensitive     * >=100,000 COLONIES/mL METHICILLIN RESISTANT STAPHYLOCOCCUS AUREUS  MRSA PCR Screening     Status: None   Collection Time: 05/04/18 12:38 PM  Result Value Ref Range Status   MRSA by PCR NEGATIVE NEGATIVE Final    Comment:        The GeneXpert MRSA Assay (FDA approved for NASAL specimens only), is one component of a comprehensive MRSA colonization surveillance program. It is not intended to diagnose MRSA infection nor to guide or monitor treatment for MRSA infections. Performed at [facility], [place] [address], [place], [place] [address]   Culture, blood (routine x 2)     Status: None (Preliminary result)   Collection Time: 05/04/18  3:00 PM  Result Value Ref Range Status   Specimen Description SITE NOT SPECIFIED  Final   Special Requests   Final    BOTTLES DRAWN AEROBIC ONLY Blood Culture adequate volume   Culture   Final    NO GROWTH 2 DAYS Performed at [facility], [place] [address], [place], [place] [address]    Report Status PENDING  Incomplete  Culture, blood (routine x 2)     Status: None (Preliminary result)   Collection Time: 05/04/18  3:04 PM  Result Value Ref Range Status   Specimen Description BLOOD LEFT HAND  Final   Special Requests   Final    BOTTLES DRAWN AEROBIC ONLY Blood Culture adequate volume  Culture   Final    NO GROWTH 2 DAYS Performed at [facility], [place] [address], [place], [place] [address]    Report Status PENDING  Incomplete         Radiology Studies: Dg Chest 2 View  Result Date: 05/04/2018 CLINICAL DATA:  Aspiration EXAM: CHEST - 2 VIEW COMPARISON:  Chest radiograph from one day prior. FINDINGS: Stable cardiomediastinal silhouette with top-normal heart size. No pneumothorax. Stable small left pleural effusion. No significant right pleural effusion. No overt pulmonary  edema. Worsening patchy left lung base opacity. IMPRESSION: 1. Worsening patchy left lung base opacity, which could represent aspiration and/or pneumonia. 2. Stable small left pleural effusion. Electronically Signed   By: Ilona Sorrel M.D.   On: 05/04/2018 21:53        Scheduled Meds: . Chlorhexidine Gluconate Cloth  6 each Topical Q0600  . collagenase   Topical Daily  . feeding supplement (NEPRO CARB STEADY)  237 mL Oral BID BM  . insulin detemir  4 Units Subcutaneous Q breakfast  . sodium chloride flush  3 mL Intravenous Q12H   Continuous Infusions: . sodium chloride    . sodium chloride       LOS: 3 days      Phillips Climes, MD [facility] Pager [phone]  If 7PM-7AM, please contact night-coverage [email or web address] Password [email or web address] 05/06/2018, 1:41 PM

## 2018-05-06 NOTE — Care Management Important Message (Signed)
Important Message  Patient Details  Name: Lance [name] MRN: [id number] Date of Birth: 04/04/1941   Medicare Important Message Given:  Yes    Delona Clasby [address] 05/06/2018, 3:25 PM

## 2018-05-07 LAB — CULTURE, BLOOD (ROUTINE X 2): Special Requests: ADEQUATE

## 2018-05-07 MED ORDER — HALOPERIDOL LACTATE 2 MG/ML PO CONC: 0.5000 mg | ORAL | 0 refills | Status: AC | PRN

## 2018-05-07 MED ORDER — HYDROMORPHONE HCL 1 MG/ML PO LIQD: 1.0000 mg | ORAL | 0 refills | Status: AC | PRN

## 2018-05-07 MED ORDER — GLYCOPYRROLATE 1 MG PO TABS: 1.0000 mg | ORAL_TABLET | ORAL | Status: AC | PRN

## 2018-05-07 NOTE — Progress Notes (Signed)
[facility] Liaison RN note '@0830'   Received request from [place] for family interest in [facility] with request for transfer. Chart reviewed. Met with Corene Cornea) son of patient to confirm interest and explain services. Family agreeable to transfer today. CSW [facility]) aware.  Registration paper work completed. Dr. Orpah Melter to assume care per family request as the Hospice facility physician. Please fax discharge summary to [phone]. RN please call report to [phone]. Please arrange transport for patient to arrive before noon if possible.   Thank you.   Raina Mina, RN, BSN [facility] Liaison  [place] are on AMION

## 2018-05-07 NOTE — Discharge Instructions (Signed)
Management per [facility]

## 2018-05-07 NOTE — Discharge Summary (Signed)
Lance [name], is a 78 y.o. male  [name] 1941-02-21  MRN [id number].  Admission date:  05/03/2018  Admitting Physician  Etta Quill, DO  Discharge Date:  05/07/2018   Primary MD  Lavone Orn, MD  Recommendations for primary care physician for things to follow:  - Management per [facility]   Admission Diagnosis  Fever [R50.9] Symptomatic anemia [D64.9] Fever, unspecified fever cause [R50.9] Chronic kidney disease, unspecified CKD stage [N18.9]   Discharge Diagnosis  Fever [R50.9] Symptomatic anemia [D64.9] Fever, unspecified fever cause [R50.9] Chronic kidney disease, unspecified CKD stage [N18.9]   Principal Problem:   Fever Active Problems:   Multiple sclerosis ([place])   Sacral decubitus ulcer   Progressive anemia   Spastic quadriplegia (HCC)   DM (diabetes mellitus) type II controlled with renal manifestation ([place])   ESRD on hemodialysis ([place])   Symptomatic anemia   MRSA bacteremia      Past Medical History:  Diagnosis Date  . Acute CHF (congestive heart failure) ([place]) 02/14/2016  . Acute osteomyelitis, pelvis, right ([place])   . Acute renal failure ([place])   . Anemia in chronic renal disease   . Blood transfusion   . Cardiomyopathy ([place]) 02/17/2016  . Chronic spastic paraplegia    secondary to MS  . Decreased sensation of lower extremity    due to MS  . Decubitus ulcer of ischium   . Decubitus ulcer of trochanter, stage 4 ([place]) 06/14/2015  . ESRD (end stage renal disease) ([place])    [place]  . History of kidney stones    x2  . History of MRSA infection    urine  . History of recurrent UTIs   . Hypertension   . MS (multiple sclerosis) ([place])   . Multiple sclerosis ([place])    stated by client in triage   . Neuralgia neuritis, sciatic nerve    MS for 30 years  . Neurogenic bladder   . PONV (postoperative nausea and vomiting)   . Presence of indwelling  urinary catheter   . Self-catheterizes urinary bladder   . Type 2 diabetes mellitus ([place])     Past Surgical History:  Procedure Laterality Date  . AMPUTATION Left 01/22/2017   Procedure: Left 5th Ray Amputation;  Surgeon: Newt Minion, MD;  Location: [facility];  Service: Orthopedics;  Laterality: Left;  . APPLICATION OF A-CELL OF EXTREMITY Left 09/05/2014   Procedure: PLACEMENT OF ACELL AND VAC;  Surgeon: Theodoro Kos, DO;  Location: [facility];  Service: Plastics;  Laterality: Left;  . BASCILIC VEIN TRANSPOSITION Right 03/24/2016   Procedure: RADIAL- CEPHALIC FISTULA CREATION RIGHT ARM;  Surgeon: Conrad Avant, MD;  Location: [facility];  Service: Vascular;  Laterality: Right;  . Buttocks flap Left   . COLONOSCOPY    . EXTRACORPOREAL SHOCK WAVE LITHOTRIPSY  03/23/2011   x2  . FINGER SURGERY  2004  . hip flap Right   . INCISION AND DRAINAGE OF WOUND Left 09/05/2014   Procedure: IRRIGATION AND DEBRIDEMENT OF LEFT ISCHIUM  WOUND WITH ;  Surgeon: Theodoro Kos, DO;  Location: [facility];  Service: Plastics;  Laterality: Left;  . INSERTION OF DIALYSIS CATHETER N/A 02/27/2016   Procedure: INSERTION OF DIALYSIS CATHETER-RIGHT INTERNAL JUGULA PLACEMENT;  Surgeon: Conrad Niceville, MD;  Location: [facility];  Service: Vascular;  Laterality: N/A;  . TONSILLECTOMY    . WOUND DEBRIDEMENT     [facility]/2/17[facility]        History of present illness and  Hospital Course:     Kindly see H&P for history of present illness and admission details, please review complete Labs, Consult reports and Test reports for all details in brief  HPI  from the history and physical done on the day of admission 05/03/2018   HPI: Lance [name] is a 78 y.o. male with medical history significant of MS, paraplegia, decubitus ulcers of sacral area as well as heels, CHF, ESRD.  Anemia of CKD requiring weekly transfusion.  Patient presents to the ED with c/o weakness, cough, fever to 102 at home.  Given 1gm tylenol PTA.  No N/V/D.  Had dialysis  Monday this week due to holiday.  Normally has dialysis TTS.   ED Course: HGB 5.8.  Patient started on cefepime / vanc.  Hospitalist asked to admit.  Hospital Course   78 y.o. male with medical history significant of MS, paraplegia, decubitus ulcers of sacral area as well as heels, CHF, ESRD.  Anemia of CKD requiring weekly transfusion.  Patient was admitted with fever, and anemia, work-up significant for MRSA bacteremia, large sacral decubitus ulcer, and with sacral mass, cannot at this point decided for no further work-up, and medicine consulted, and he is being discharged to hospice facility.   MRSA Bacteremia  Sacral Decubitus Ulcer  Fever: -There is no evidence of osteomyelitis on imaging, this is very likely in the setting of infected sacral decubitus ulcer,  -air Flow bed mattress during hospital stay - no indication for TEE -ID input greatly appreciated, initially on IV vancomycin, currently comfort care, off antibiotics  Sacral Mass: -Consistent with tumor, possible plasmacytoma, follow SPEP/UPEP/light chains -At this point patient is considering more palliative approach, well with palliative care consult for making any decision about further work-up, as patient seems to be inclined to proceed with any work-up.  Dysphagia/risk of aspiration: -[place] was seen by SLP, he is at risk of aspiration, he is aware of that, does not want any further testing, he wants to be comfortable, he wants to be resumed back on diet, he is aware of his . Feeding for comfort  Anemia: s/p 2 units pRBC, follow.  Negative FOBT.  ESRD: nephrology c/s, appreciate recs, now he is comfort care, no further dialysis  MS: pt is bedbound, takes baclofen (in setting of ESRD)  T2DM: No further CBGs order insulin, given his comfort  HTN  Goals of Care:  Palliative care has been consulted, after discussion with the patient and family, decision has been made for full comfort measures, and to be  [facility] to [facility] today    Discharge Condition:  Hospice       Discharge Instructions  and  Discharge Medications    Discharge Instructions    Discharge instructions   Complete by:  As directed    Management per [facility]     Allergies as of 05/07/2018   No Known Allergies     Medication List    STOP taking these medications   baclofen 10 MG tablet Commonly known as:  LIORESAL   carvedilol 3.125 MG tablet Commonly known as:  COREG   eucerin cream   insulin aspart 100 UNIT/ML FlexPen Commonly known as:  NOVOLOG FLEXPEN   insulin detemir 100 UNIT/ML injection Commonly known as:  LEVEMIR   lidocaine-prilocaine cream Commonly known as:  EMLA   Melatonin 3 MG Tabs   midodrine 10 MG tablet Commonly known as:  PROAMATINE   Pen Needles 30G X 8 MM Misc   SANTYL ointment Generic drug:  collagenase   simvastatin 20 MG tablet Commonly known as:  ZOCOR   trimethoprim 100 MG tablet Commonly known as:  TRIMPEX     TAKE these medications   acetaminophen 325 MG tablet Commonly known as:  TYLENOL Take 2 tablets (650 mg total) by mouth every 6 (six) hours as needed for mild pain (or Fever >/= 101).   glycopyrrolate 1 MG tablet Commonly known as:  ROBINUL Take 1 tablet (1 mg total) by mouth every 4 (four) hours as needed (excessive secretions).   haloperidol 2 MG/ML solution Commonly known as:  HALDOL Place 0.3 mLs (0.6 mg total) under the tongue every 4 (four) hours as needed for agitation (or delirium).   HYDROmorphone HCl 1 MG/ML Liqd Commonly known as:  DILAUDID Take 1 mL (1 mg total) by mouth every 4 (four) hours as needed for severe pain (sob, discomfort at end of life).         Diet and Activity recommendation: See Discharge Instructions above   Consults obtained -    Infectious Disease  Palliative Care   Nephrology   Major procedures and Radiology Reports - PLEASE review detailed and final reports for all details,  in brief -      Dg Chest 2 View  Result Date: 05/04/2018 CLINICAL DATA:  Aspiration EXAM: CHEST - 2 VIEW COMPARISON:  Chest radiograph from one day prior. FINDINGS: Stable cardiomediastinal silhouette with top-normal heart size. No pneumothorax. Stable small left pleural effusion. No significant right pleural effusion. No overt pulmonary edema. Worsening patchy left lung base opacity. IMPRESSION: 1. Worsening patchy left lung base opacity, which could represent aspiration and/or pneumonia. 2. Stable small left pleural effusion. Electronically Signed   By: Ilona Sorrel M.D.   On: 05/04/2018 21:53   Mr Pelvis Wo Contrast  Result Date: 05/04/2018 CLINICAL DATA:  Pressure ulcer. EXAM: MRI PELVIS WITHOUT CONTRAST TECHNIQUE: Multiplanar multisequence MR imaging of the pelvis was performed. No intravenous contrast was administered. COMPARISON:  MRI dated 02/14/2016 FINDINGS: Musculoskeletal: There appears to be a mass destroying the S2 segment of the sacrum and extending into the sacral spinal canal. This was not present on the prior study. Does the patient have a history of malignancy? If not, plasmocytoma should be considered. The sacrum appears to have been resected below the third sacral segment. Prominent sacral decubitus ulcer is just below the third sacral segment. No discrete osteomyelitis. There is new diffuse mottling of the bone marrow of the pelvic bones remnants of the sacrum. Urinary Tract:  Foley catheter in place.  Thickened bladder wall. Bowel:  Unremarkable visualized pelvic bowel loops. Vascular/Lymphatic: No pathologically enlarged lymph nodes. No significant vascular abnormality seen. Reproductive:  No mass or other significant abnormality Other: There is a diffuse edema within the muscles of the pelvis and buttocks. IMPRESSION: 1. New mass destroying the second sacral segment and extending into the sacral spinal canal. This is consistent with tumor. Does the patient have any known malignancy?  If not, the possibility of plasmacytoma should be considered.  2. Deep sacral decubitus ulcer with previous resection of the distal sacrum and coccyx. No findings suggestive of current osteomyelitis. Electronically Signed   By: Lorriane Shire M.D.   On: 05/04/2018 10:37   Dg Chest Port 1 View  Result Date: 05/03/2018 CLINICAL DATA:  Weakness with fever. EXAM: PORTABLE CHEST 1 VIEW COMPARISON:  04/18/2018. FINDINGS: Borderline cardiac enlargement. LEFT pleural effusion appears increased. Slight retrocardiac density may be persistent. Streaky BILATERAL interstitial markings represent emphysematous change particularly at the lung apices. No definite consolidation. IMPRESSION: 1. Increasing LEFT pleural effusion. Electronically Signed   By: Staci Righter M.D.   On: 05/03/2018 15:31   Dg Chest Port 1 View  Result Date: 04/18/2018 CLINICAL DATA:  Low hemoglobin and tachycardia. EXAM: PORTABLE CHEST 1 VIEW COMPARISON:  02/09/2017 FINDINGS: Stable cardiomegaly with mild aortic atherosclerosis. Blunting of the left lateral costophrenic angle with partial clearing of previously noted consolidation in the retrocardiac portion of the chest is noted. There is some persistent opacity and confluence along the periphery of the left lung base that may reflect an area of residual pneumonia. Attenuation of the pulmonary vasculature, upper lobe predominant consistent with emphysematous disease is identified. Trace fluid in the minor fissure is noted since prior. IMPRESSION: Cardiomegaly with partial clearing of left base consolidation/pneumonia since prior. Small residual left pleural effusion and/or pneumonia persists. Emphysematous changes of the lungs, upper lobe predominant. Electronically Signed   By: Ashley Royalty M.D.   On: 04/18/2018 15:33    Micro Results    Recent Results (from the past 240 hour(s))  Blood Culture (routine x 2)     Status: Abnormal   Collection Time: 05/03/18  3:37 PM  Result Value Ref Range  Status   Specimen Description BLOOD LEFT HAND  Final   Special Requests   Final    BOTTLES DRAWN AEROBIC AND ANAEROBIC Blood Culture adequate volume   Culture  Setup Time   Final    GRAM POSITIVE COCCI IN CLUSTERS IN BOTH AEROBIC AND ANAEROBIC BOTTLES CRITICAL VALUE NOTED.  VALUE IS CONSISTENT WITH PREVIOUSLY REPORTED AND CALLED VALUE. Performed at [facility], [place] [address], [place], [place] [address]    Culture (A)  Final    STAPHYLOCOCCUS AUREUS SUSCEPTIBILITIES PERFORMED ON PREVIOUS CULTURE WITHIN THE LAST 5 DAYS.    Report Status 05/07/2018 FINAL  Final  Blood Culture (routine x 2)     Status: Abnormal   Collection Time: 05/03/18  3:50 PM  Result Value Ref Range Status   Specimen Description BLOOD LEFT FOREARM  Final   Special Requests   Final    BOTTLES DRAWN AEROBIC AND ANAEROBIC Blood Culture results may not be optimal due to an excessive volume of blood received in culture bottles   Culture  Setup Time   Final    GRAM POSITIVE COCCI IN BOTH AEROBIC AND ANAEROBIC BOTTLES CRITICAL RESULT CALLED TO, READ BACK BY AND VERIFIED WITH: Drema Dallas PHARMD, AT 1004 05/04/18 BY D. VANHOOK Performed at [facility], [place] [address] [place], [place] [address]    Culture METHICILLIN RESISTANT STAPHYLOCOCCUS AUREUS (A)  Final   Report Status 05/07/2018 FINAL  Final   Organism ID, Bacteria METHICILLIN RESISTANT STAPHYLOCOCCUS AUREUS  Final      Susceptibility   Methicillin resistant staphylococcus aureus - MIC*    CIPROFLOXACIN >=8 RESISTANT Resistant     ERYTHROMYCIN >=8 RESISTANT Resistant     GENTAMICIN <=0.5 SENSITIVE Sensitive     OXACILLIN >=4 RESISTANT Resistant  TETRACYCLINE <=1 SENSITIVE Sensitive     VANCOMYCIN 1 SENSITIVE Sensitive     TRIMETH/SULFA >=320 RESISTANT Resistant     CLINDAMYCIN <=0.25 SENSITIVE Sensitive     RIFAMPIN <=0.5 SENSITIVE Sensitive     Inducible Clindamycin NEGATIVE Sensitive     * METHICILLIN RESISTANT STAPHYLOCOCCUS AUREUS  Blood  Culture ID Panel (Reflexed)     Status: Abnormal   Collection Time: 05/03/18  3:50 PM  Result Value Ref Range Status   Enterococcus species NOT DETECTED NOT DETECTED Final   Listeria monocytogenes NOT DETECTED NOT DETECTED Final   Staphylococcus species DETECTED (A) NOT DETECTED Final    Comment: CRITICAL RESULT CALLED TO, READ BACK BY AND VERIFIED WITH: J. TESSIN PHARMD, AT 1004 05/04/18 BY D. VANHOOK    Staphylococcus aureus (BCID) DETECTED (A) NOT DETECTED Final    Comment: Methicillin (oxacillin)-resistant Staphylococcus aureus (MRSA). MRSA is predictably resistant to beta-lactam antibiotics (except ceftaroline). Preferred therapy is vancomycin unless clinically contraindicated. Patient requires contact precautions if  hospitalized. CRITICAL RESULT CALLED TO, READ BACK BY AND VERIFIED WITH: J. TESSIN PHARMD, AT 1004 05/04/18 BY D. VANHOOK    Methicillin resistance DETECTED (A) NOT DETECTED Final    Comment: CRITICAL RESULT CALLED TO, READ BACK BY AND VERIFIED WITH: J. TESSIN PHARMD, AT 1004 05/04/18 BY D. VANHOOK    Streptococcus species NOT DETECTED NOT DETECTED Final   Streptococcus agalactiae NOT DETECTED NOT DETECTED Final   Streptococcus pneumoniae NOT DETECTED NOT DETECTED Final   Streptococcus pyogenes NOT DETECTED NOT DETECTED Final   Acinetobacter baumannii NOT DETECTED NOT DETECTED Final   Enterobacteriaceae species NOT DETECTED NOT DETECTED Final   Enterobacter cloacae complex NOT DETECTED NOT DETECTED Final   Escherichia coli NOT DETECTED NOT DETECTED Final   Klebsiella oxytoca NOT DETECTED NOT DETECTED Final   Klebsiella pneumoniae NOT DETECTED NOT DETECTED Final   Proteus species NOT DETECTED NOT DETECTED Final   Serratia marcescens NOT DETECTED NOT DETECTED Final   Haemophilus influenzae NOT DETECTED NOT DETECTED Final   Neisseria meningitidis NOT DETECTED NOT DETECTED Final   Pseudomonas aeruginosa NOT DETECTED NOT DETECTED Final   Candida albicans NOT DETECTED NOT  DETECTED Final   Candida glabrata NOT DETECTED NOT DETECTED Final   Candida krusei NOT DETECTED NOT DETECTED Final   Candida parapsilosis NOT DETECTED NOT DETECTED Final   Candida tropicalis NOT DETECTED NOT DETECTED Final    Comment: Performed at [facility], [address] [place], [place] [address]  Urine culture     Status: Abnormal   Collection Time: 05/03/18  7:29 PM  Result Value Ref Range Status   Specimen Description URINE, CATHETERIZED  Final   Special Requests   Final    NONE Performed at [facility], [place] [address], [place], [place] [address]    Culture (A)  Final    >=100,000 COLONIES/mL METHICILLIN RESISTANT STAPHYLOCOCCUS AUREUS >=100,000 COLONIES/mL ACINETOBACTER CALCOACETICUS/BAUMANNII COMPLEX    Report Status 05/06/2018 FINAL  Final   Organism ID, Bacteria METHICILLIN RESISTANT STAPHYLOCOCCUS AUREUS (A)  Final   Organism ID, Bacteria ACINETOBACTER CALCOACETICUS/BAUMANNII COMPLEX (A)  Final      Susceptibility   Acinetobacter calcoaceticus/baumannii complex - MIC*    CEFTAZIDIME >=64 RESISTANT Resistant     CEFTRIAXONE >=64 RESISTANT Resistant     CIPROFLOXACIN >=4 RESISTANT Resistant     GENTAMICIN 4 SENSITIVE Sensitive     IMIPENEM 1 SENSITIVE Sensitive     PIP/TAZO >=128 RESISTANT Resistant     TRIMETH/SULFA >=320 RESISTANT  Resistant     CEFEPIME >=64 RESISTANT Resistant     AMPICILLIN/SULBACTAM <=2 SENSITIVE Sensitive     * >=100,000 COLONIES/mL ACINETOBACTER CALCOACETICUS/BAUMANNII COMPLEX   Methicillin resistant staphylococcus aureus - MIC*    CIPROFLOXACIN >=8 RESISTANT Resistant     GENTAMICIN <=0.5 SENSITIVE Sensitive     NITROFURANTOIN <=16 SENSITIVE Sensitive     OXACILLIN >=4 RESISTANT Resistant     TETRACYCLINE <=1 SENSITIVE Sensitive     VANCOMYCIN 1 SENSITIVE Sensitive     TRIMETH/SULFA >=320 RESISTANT Resistant     CLINDAMYCIN <=0.25 SENSITIVE Sensitive     RIFAMPIN <=0.5 SENSITIVE Sensitive     Inducible Clindamycin NEGATIVE  Sensitive     * >=100,000 COLONIES/mL METHICILLIN RESISTANT STAPHYLOCOCCUS AUREUS  MRSA PCR Screening     Status: None   Collection Time: 05/04/18 12:38 PM  Result Value Ref Range Status   MRSA by PCR NEGATIVE NEGATIVE Final    Comment:        The GeneXpert MRSA Assay (FDA approved for NASAL specimens only), is one component of a comprehensive MRSA colonization surveillance program. It is not intended to diagnose MRSA infection nor to guide or monitor treatment for MRSA infections. Performed at [facility], [place] [address], [place], [place] [address]   Culture, blood (routine x 2)     Status: None (Preliminary result)   Collection Time: 05/04/18  3:00 PM  Result Value Ref Range Status   Specimen Description SITE NOT SPECIFIED  Final   Special Requests   Final    BOTTLES DRAWN AEROBIC ONLY Blood Culture adequate volume Performed at [facility], [place] [address], [place], [place] [address]    Culture NO GROWTH 3 DAYS  Final   Report Status PENDING  Incomplete  Culture, blood (routine x 2)     Status: None (Preliminary result)   Collection Time: 05/04/18  3:04 PM  Result Value Ref Range Status   Specimen Description BLOOD LEFT HAND  Final   Special Requests   Final    BOTTLES DRAWN AEROBIC ONLY Blood Culture adequate volume Performed at [facility], [place] [address] [place], [name] [address]    Culture NO GROWTH 3 DAYS  Final   Report Status PENDING  Incomplete       Today   Subjective:   Jemari Meckes today significant events per staff overnight, patient reported feeling comfortable    Objective:   Blood pressure 121/80, pulse (!) 146, temperature 97.8 F (36.6 C), resp. rate (!) 26, weight 71 kg, SpO2 96 %.   Intake/Output Summary (Last 24 hours) at 05/07/2018 1013 Last data filed at 05/07/2018 0721 Gross per 24 hour  Intake -  Output 200 ml  Net -200 ml    Exam Sleeping comfortably, wakes up and answers questions, then go back to  sleep  Diminished air entry bilaterally  Regular heart rate and rhythm, no rubs or gallops Bowel sounds present Extremities with no edema  Data Review   CBC w Diff:  Lab Results  Component Value Date   WBC 12.5 (H) 05/05/2018   HGB 8.6 (L) 05/05/2018   HCT 27.4 (L) 05/05/2018   HCT 31.5 (L) 09/05/2017   PLT 135 (L) 05/05/2018   LYMPHOPCT 6 05/03/2018   MONOPCT 6 05/03/2018   EOSPCT 0 05/03/2018   BASOPCT 0 05/03/2018    CMP:  Lab Results  Component Value Date   NA 137 05/05/2018   NA 143 04/20/2016   K 4.2  05/05/2018   CL 99 05/05/2018   CO2 26 05/05/2018   BUN 51 (H) 05/05/2018   BUN 62 (A) 04/20/2016   CREATININE 3.51 (H) 05/05/2018   GLU 125 04/20/2016   PROT 7.5 05/05/2018   ALBUMIN 1.6 (L) 05/05/2018   BILITOT 0.9 05/05/2018   ALKPHOS 78 05/05/2018   AST 29 05/05/2018   ALT 14 05/05/2018  .   Total Time in preparing paper work, data evaluation and todays exam - 43 minutes  Phillips Climes M.D on 05/07/2018 at 10:13 AM  [facility]   Office  [phone]

## 2018-05-07 NOTE — Progress Notes (Signed)
Clinical Social Worker facilitated patient discharge including contacting patient family and facility to confirm patient discharge plans.  Clinical information faxed to facility and family agreeable with plan.  CSW arranged ambulance transport via [facility] to [facility] .  RN to call for report prior to discharge [phone]).  Clinical Social Worker will sign off for now as social work intervention is no longer needed. Please consult [place] again if new need arises.  Mahamad Jeanne Terrance LCSW [phone]

## 2018-05-09 LAB — CULTURE, BLOOD (ROUTINE X 2)
Culture: NO GROWTH
Culture: NO GROWTH
Special Requests: ADEQUATE
Special Requests: ADEQUATE

## 2018-05-27 ENCOUNTER — Encounter: Payer: Self-pay | Admitting: Nephrology

## 2018-06-04 DEATH — deceased

## 2019-07-25 IMAGING — MR MR PELVIS W/O CM
6 of 9 series · 28 of 48 positions shown · non-contrast
Comparison: MRI dated 02/14/2016

CLINICAL DATA: Pressure ulcer.

EXAM:
MRI PELVIS WITHOUT CONTRAST
TECHNIQUE: Multiplanar multisequence MR imaging of the pelvis was performed. No
intravenous contrast was administered.

[Series 3: T1 · axial · 4.0mm · 1.09mm/px · z∈[-82,+75]mm · 4 of 37 slices shown]
[im 1/37]
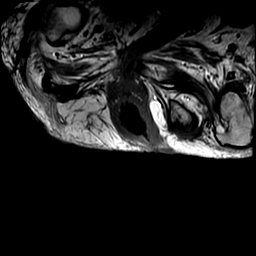
[im 13/37]
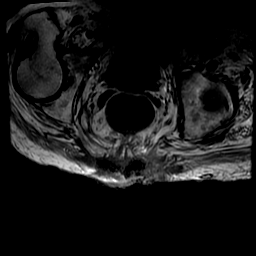
[im 25/37]
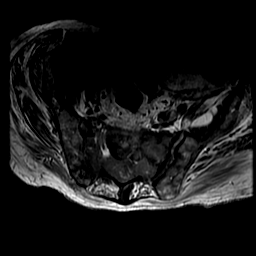
[im 37/37]
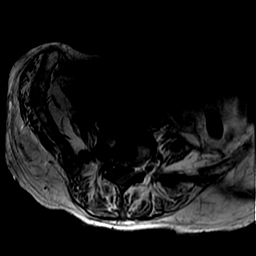

[Series 4: T2 fat-sat · axial · 4.0mm · 1.09mm/px · z∈[-82,+75]mm · 5 of 37 slices shown (1 of 2)]
[im 1/37]
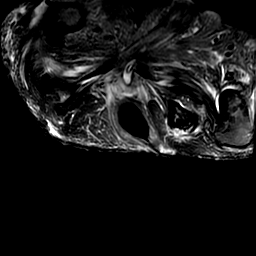
[im 10/37]
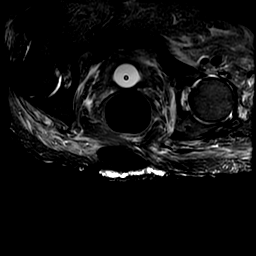
[im 19/37]
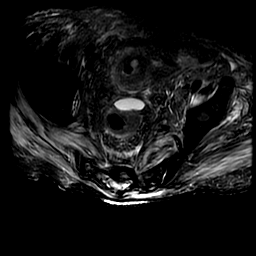
[im 28/37]
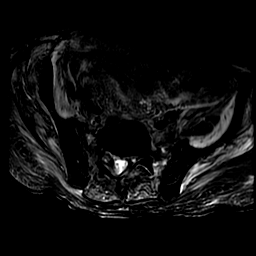
[im 37/37]
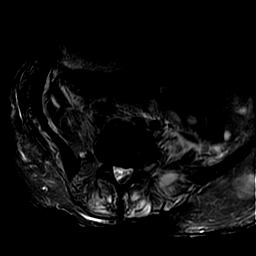

[Series 5: STIR · oblique · 3.0mm · 0.78mm/px · 4 of 30 slices shown (1 of 2)]
[im 1/30]
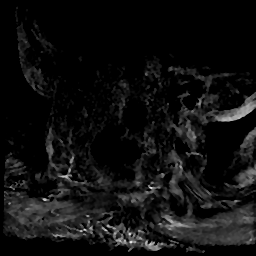
[im 10/30]
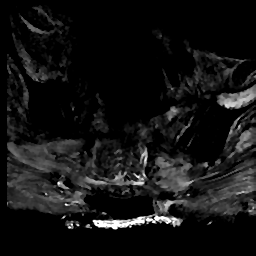
[im 20/30]
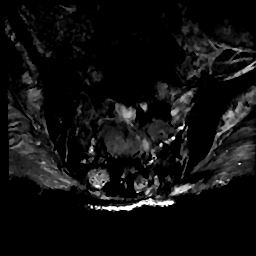
[im 30/30]
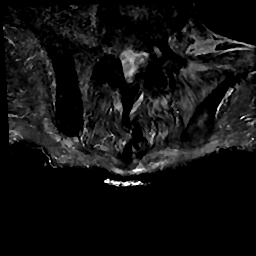

[Series 7: T2 · coronal · 5.0mm · 0.74mm/px · 5 of 32 slices shown]
[im 1/32]
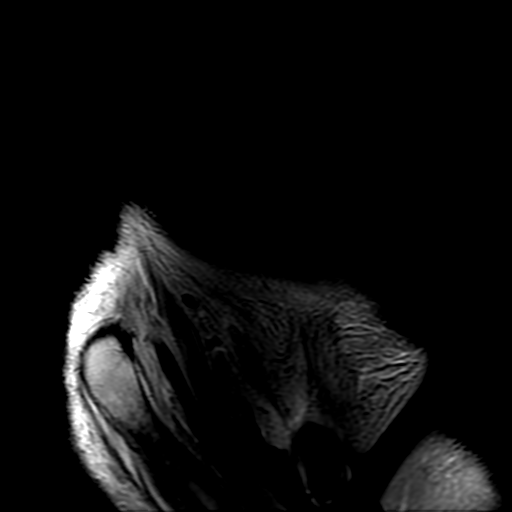
[im 8/32]
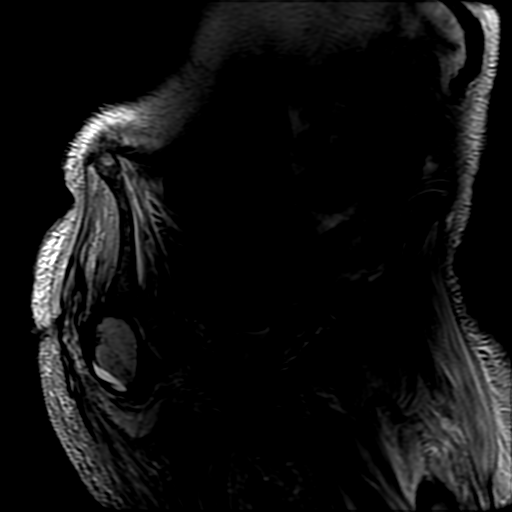
[im 16/32]
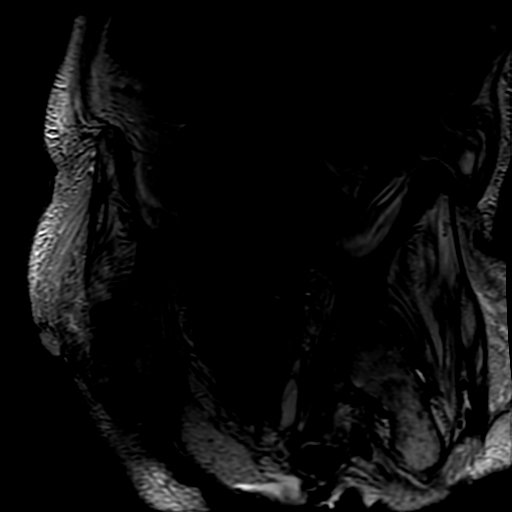
[im 24/32]
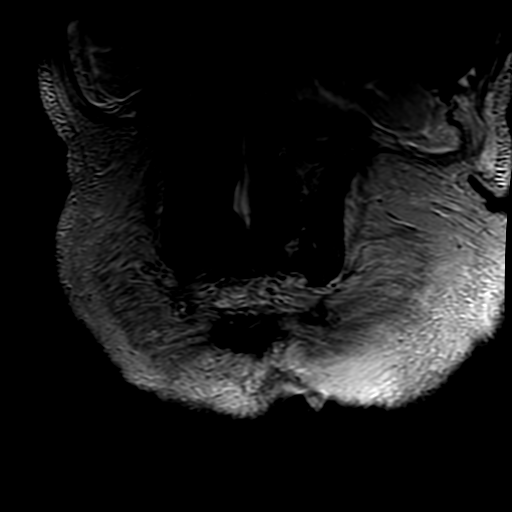
[im 32/32]
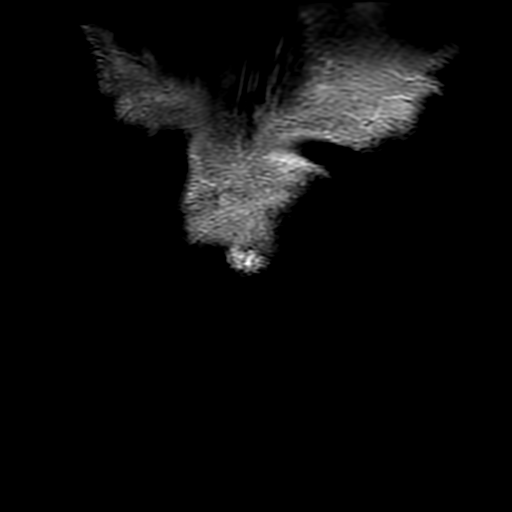

[Series 8: STIR · coronal · 5.0mm · 0.74mm/px · 5 of 32 slices shown (2 of 2)]
[im 1/32]
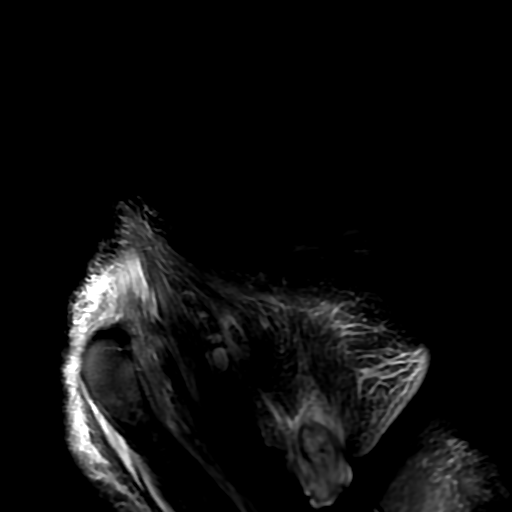
[im 8/32]
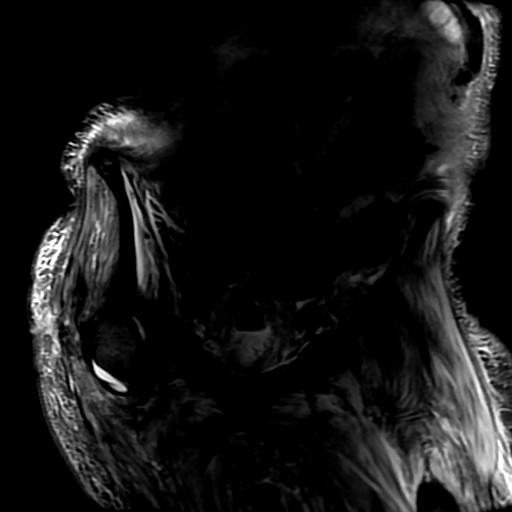
[im 16/32]
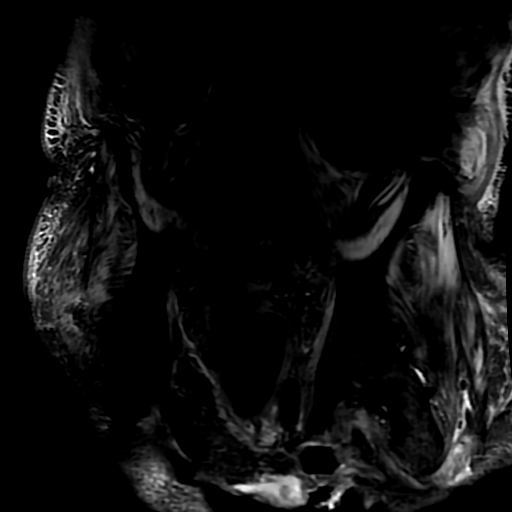
[im 24/32]
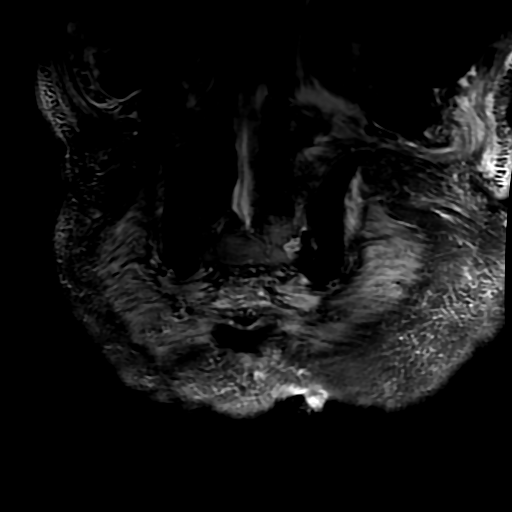
[im 32/32]
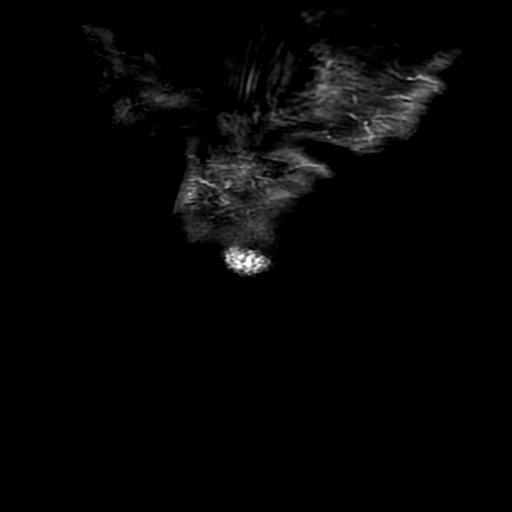

[Series 9: T2 fat-sat · axial · 5.0mm · 0.70mm/px · z∈[-50,+130]mm · 5 of 39 slices shown (2 of 2)]
[im 1/39]
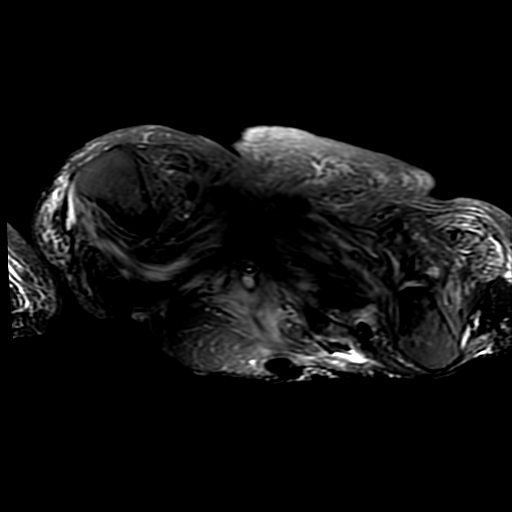
[im 8/39]
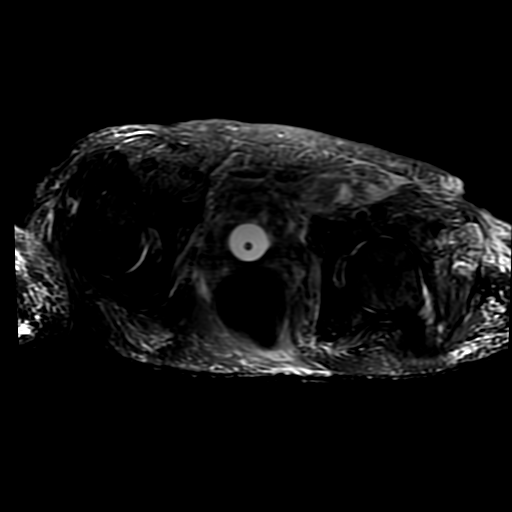
[im 16/39]
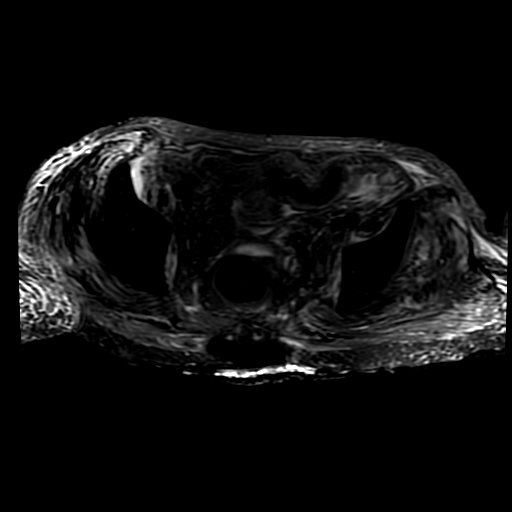
[im 23/39]
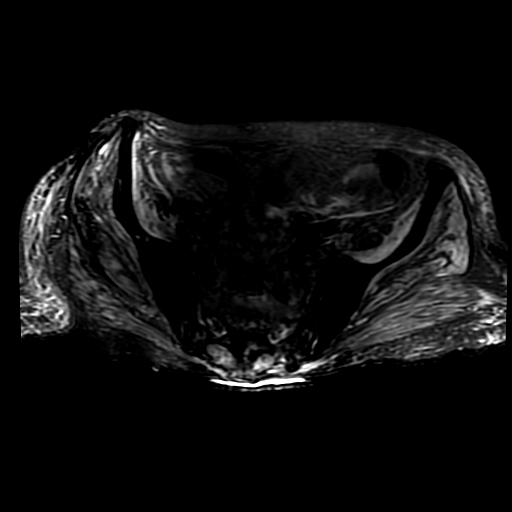
[im 31/39]
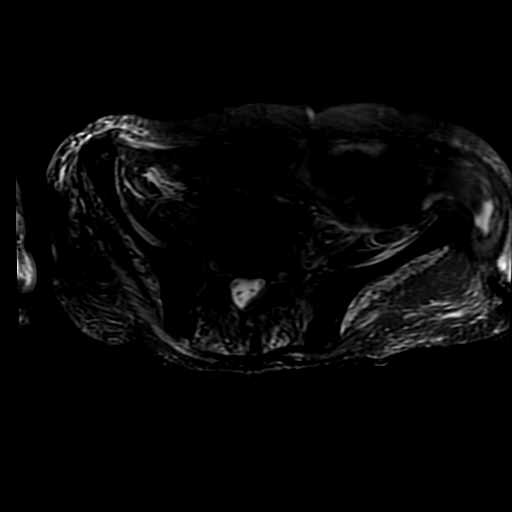

[28 of 48 positions shown; findings below may reference images not displayed]

FINDINGS: Musculoskeletal: There appears to be a mass destroying the S2
segment of the sacrum and extending into the sacral spinal canal.
This was not present on the prior study. Does the patient have a
history of malignancy? If not, plasmocytoma should be considered.

The sacrum appears to have been resected below the third sacral
segment. Prominent sacral decubitus ulcer is just below the third
sacral segment. No discrete osteomyelitis.

There is new diffuse mottling of the bone marrow of the pelvic bones
remnants of the sacrum.

Urinary Tract:  Foley catheter in place.  Thickened bladder wall.

Bowel:  Unremarkable visualized pelvic bowel loops.

Vascular/Lymphatic: No pathologically enlarged lymph nodes. No
significant vascular abnormality seen.

Reproductive:  No mass or other significant abnormality

Other: There is a diffuse edema within the muscles of the pelvis and
buttocks.
IMPRESSION: 1. New mass destroying the second sacral segment and extending into
the sacral spinal canal. This is consistent with tumor. Does the
patient have any known malignancy? If not, the possibility of
plasmacytoma should be considered.
2. Deep sacral decubitus ulcer with previous resection of the distal
sacrum and coccyx. No findings suggestive of current osteomyelitis.
